# Patient Record
Sex: Male | Born: 1945 | Race: Black or African American | Hispanic: No | Marital: Married | Smoking: Never smoker
Health system: Southern US, Community
[De-identification: ages and names within clinical notes are randomized; demographics above are authoritative.]

## PROBLEM LIST (undated history)

## (undated) DIAGNOSIS — E876 Hypokalemia: Secondary | ICD-10-CM

## (undated) DIAGNOSIS — N183 Chronic kidney disease, stage 3 (moderate): Secondary | ICD-10-CM

## (undated) DIAGNOSIS — C801 Malignant (primary) neoplasm, unspecified: Secondary | ICD-10-CM

## (undated) DIAGNOSIS — D539 Nutritional anemia, unspecified: Secondary | ICD-10-CM

## (undated) DIAGNOSIS — I10 Essential (primary) hypertension: Secondary | ICD-10-CM

## (undated) DIAGNOSIS — E785 Hyperlipidemia, unspecified: Secondary | ICD-10-CM

## (undated) DIAGNOSIS — J302 Other seasonal allergic rhinitis: Secondary | ICD-10-CM

## (undated) DIAGNOSIS — R197 Diarrhea, unspecified: Secondary | ICD-10-CM

## (undated) DIAGNOSIS — D649 Anemia, unspecified: Secondary | ICD-10-CM

## (undated) HISTORY — DX: Anemia, unspecified: D64.9

## (undated) HISTORY — DX: Nutritional anemia, unspecified: D53.9

## (undated) HISTORY — PX: OTHER SURGICAL HISTORY: SHX169

## (undated) HISTORY — PX: EYE SURGERY: SHX253

## (undated) HISTORY — DX: Diarrhea, unspecified: R19.7

## (undated) HISTORY — DX: Chronic kidney disease, stage 3 (moderate): N18.3

## (undated) HISTORY — DX: Essential (primary) hypertension: I10

---

## 1999-03-13 ENCOUNTER — Encounter: Admission: RE | Admit: 1999-03-13 | Discharge: 1999-03-13 | Payer: Self-pay | Admitting: Nephrology

## 1999-03-13 ENCOUNTER — Encounter: Payer: Self-pay | Admitting: Nephrology

## 2005-10-14 ENCOUNTER — Encounter: Admission: RE | Admit: 2005-10-14 | Discharge: 2005-10-14 | Payer: Self-pay | Admitting: Nephrology

## 2008-03-18 ENCOUNTER — Emergency Department (HOSPITAL_COMMUNITY): Admission: EM | Admit: 2008-03-18 | Discharge: 2008-03-18 | Payer: Self-pay | Admitting: Emergency Medicine

## 2009-02-27 ENCOUNTER — Encounter: Admission: RE | Admit: 2009-02-27 | Discharge: 2009-02-27 | Payer: Self-pay | Admitting: Nephrology

## 2010-04-05 ENCOUNTER — Emergency Department (HOSPITAL_COMMUNITY)
Admission: EM | Admit: 2010-04-05 | Discharge: 2010-04-06 | Disposition: A | Discharge status: Home / Self Care | Payer: Medicare PPO | Attending: Emergency Medicine | Admitting: Emergency Medicine

## 2010-04-05 ENCOUNTER — Emergency Department (HOSPITAL_COMMUNITY)
Admission: EM | Admit: 2010-04-05 | Payer: Medicare PPO | Source: Home / Self Care | Attending: Emergency Medicine | Admitting: Emergency Medicine

## 2010-04-05 DIAGNOSIS — D649 Anemia, unspecified: Secondary | ICD-10-CM | POA: Insufficient documentation

## 2010-04-05 DIAGNOSIS — N289 Disorder of kidney and ureter, unspecified: Secondary | ICD-10-CM | POA: Insufficient documentation

## 2010-04-05 DIAGNOSIS — K219 Gastro-esophageal reflux disease without esophagitis: Secondary | ICD-10-CM | POA: Insufficient documentation

## 2010-04-05 DIAGNOSIS — Z79899 Other long term (current) drug therapy: Secondary | ICD-10-CM | POA: Insufficient documentation

## 2010-04-05 DIAGNOSIS — R51 Headache: Secondary | ICD-10-CM | POA: Insufficient documentation

## 2010-04-05 DIAGNOSIS — Z862 Personal history of diseases of the blood and blood-forming organs and certain disorders involving the immune mechanism: Secondary | ICD-10-CM | POA: Insufficient documentation

## 2010-04-05 DIAGNOSIS — R6884 Jaw pain: Secondary | ICD-10-CM | POA: Insufficient documentation

## 2010-04-05 DIAGNOSIS — I10 Essential (primary) hypertension: Secondary | ICD-10-CM | POA: Insufficient documentation

## 2010-04-05 DIAGNOSIS — E7800 Pure hypercholesterolemia, unspecified: Secondary | ICD-10-CM | POA: Insufficient documentation

## 2010-04-05 DIAGNOSIS — Z8639 Personal history of other endocrine, nutritional and metabolic disease: Secondary | ICD-10-CM | POA: Insufficient documentation

## 2010-04-05 LAB — DIFFERENTIAL
Basophils Relative: 0 % (ref 0–1)
Eosinophils Absolute: 0 10*3/uL (ref 0.0–0.7)
Eosinophils Relative: 0 % (ref 0–5)
Monocytes Relative: 4 % (ref 3–12)
Neutro Abs: 11 10*3/uL — ABNORMAL HIGH (ref 1.7–7.7)

## 2010-04-05 LAB — POCT I-STAT, CHEM 8
Calcium, Ion: 1.11 mmol/L — ABNORMAL LOW (ref 1.12–1.32)
Chloride: 105 mEq/L (ref 96–112)
Glucose, Bld: 140 mg/dL — ABNORMAL HIGH (ref 70–99)
HCT: 34 % — ABNORMAL LOW (ref 39.0–52.0)
TCO2: 24 mmol/L (ref 0–100)

## 2010-04-05 LAB — CBC
HCT: 28.8 % — ABNORMAL LOW (ref 39.0–52.0)
Hemoglobin: 9.3 g/dL — ABNORMAL LOW (ref 13.0–17.0)
MCH: 27.8 pg (ref 26.0–34.0)
MCHC: 32.3 g/dL (ref 30.0–36.0)
RBC: 3.35 MIL/uL — ABNORMAL LOW (ref 4.22–5.81)

## 2010-04-06 ENCOUNTER — Emergency Department (HOSPITAL_COMMUNITY): Payer: Medicare PPO

## 2010-04-06 MED ORDER — IOHEXOL 300 MG/ML  SOLN
100.0000 mL | Freq: Once | INTRAMUSCULAR | Status: AC | PRN
  Administered 2010-04-06: 75 mL via INTRAVENOUS

## 2010-07-18 ENCOUNTER — Other Ambulatory Visit: Payer: Self-pay | Admitting: Orthopedic Surgery

## 2010-07-18 DIAGNOSIS — R531 Weakness: Secondary | ICD-10-CM

## 2010-07-18 DIAGNOSIS — M419 Scoliosis, unspecified: Secondary | ICD-10-CM

## 2010-07-18 DIAGNOSIS — R52 Pain, unspecified: Secondary | ICD-10-CM

## 2010-08-01 ENCOUNTER — Ambulatory Visit
Admission: RE | Admit: 2010-08-01 | Discharge: 2010-08-01 | Disposition: A | Discharge status: Home / Self Care | Payer: Medicare PPO | Source: Ambulatory Visit | Attending: Orthopedic Surgery | Admitting: Orthopedic Surgery

## 2010-08-01 DIAGNOSIS — R52 Pain, unspecified: Secondary | ICD-10-CM

## 2010-08-01 DIAGNOSIS — M419 Scoliosis, unspecified: Secondary | ICD-10-CM

## 2010-08-01 DIAGNOSIS — R531 Weakness: Secondary | ICD-10-CM

## 2010-09-13 ENCOUNTER — Emergency Department (HOSPITAL_COMMUNITY): Payer: Medicare PPO

## 2010-09-13 ENCOUNTER — Emergency Department (HOSPITAL_COMMUNITY)
Admission: EM | Admit: 2010-09-13 | Discharge: 2010-09-13 | Disposition: A | Discharge status: Home / Self Care | Payer: Medicare PPO | Attending: Emergency Medicine | Admitting: Emergency Medicine

## 2010-09-13 DIAGNOSIS — Z862 Personal history of diseases of the blood and blood-forming organs and certain disorders involving the immune mechanism: Secondary | ICD-10-CM | POA: Insufficient documentation

## 2010-09-13 DIAGNOSIS — M25539 Pain in unspecified wrist: Secondary | ICD-10-CM | POA: Insufficient documentation

## 2010-09-13 DIAGNOSIS — I10 Essential (primary) hypertension: Secondary | ICD-10-CM | POA: Insufficient documentation

## 2010-09-13 DIAGNOSIS — R51 Headache: Secondary | ICD-10-CM | POA: Insufficient documentation

## 2010-09-13 DIAGNOSIS — M542 Cervicalgia: Secondary | ICD-10-CM | POA: Insufficient documentation

## 2010-09-13 DIAGNOSIS — M79609 Pain in unspecified limb: Secondary | ICD-10-CM | POA: Insufficient documentation

## 2010-09-13 DIAGNOSIS — Z8639 Personal history of other endocrine, nutritional and metabolic disease: Secondary | ICD-10-CM | POA: Insufficient documentation

## 2010-09-13 DIAGNOSIS — M545 Low back pain, unspecified: Secondary | ICD-10-CM | POA: Insufficient documentation

## 2010-09-13 DIAGNOSIS — K219 Gastro-esophageal reflux disease without esophagitis: Secondary | ICD-10-CM | POA: Insufficient documentation

## 2010-09-13 DIAGNOSIS — M25519 Pain in unspecified shoulder: Secondary | ICD-10-CM | POA: Insufficient documentation

## 2010-09-13 DIAGNOSIS — M25529 Pain in unspecified elbow: Secondary | ICD-10-CM | POA: Insufficient documentation

## 2010-09-13 DIAGNOSIS — M4120 Other idiopathic scoliosis, site unspecified: Secondary | ICD-10-CM | POA: Insufficient documentation

## 2010-09-13 DIAGNOSIS — Z79899 Other long term (current) drug therapy: Secondary | ICD-10-CM | POA: Insufficient documentation

## 2010-09-13 DIAGNOSIS — E789 Disorder of lipoprotein metabolism, unspecified: Secondary | ICD-10-CM | POA: Insufficient documentation

## 2010-11-08 ENCOUNTER — Encounter: Payer: Medicare PPO | Admitting: Oncology

## 2010-11-14 ENCOUNTER — Telehealth: Payer: Self-pay | Admitting: Oncology

## 2010-11-14 NOTE — Telephone Encounter (Signed)
Talked to pt , he is aware of appt date ,location and telephone number.

## 2010-11-26 ENCOUNTER — Other Ambulatory Visit: Payer: Medicare PPO | Admitting: Lab

## 2010-11-26 ENCOUNTER — Telehealth: Payer: Self-pay | Admitting: Oncology

## 2010-11-26 ENCOUNTER — Ambulatory Visit (HOSPITAL_BASED_OUTPATIENT_CLINIC_OR_DEPARTMENT_OTHER): Payer: Medicare PPO | Admitting: Oncology

## 2010-11-26 VITALS — BP 160/73 | HR 80 | Temp 97.4°F | Ht 69.0 in | Wt 201.1 lb

## 2010-11-26 DIAGNOSIS — I10 Essential (primary) hypertension: Secondary | ICD-10-CM

## 2010-11-26 DIAGNOSIS — D472 Monoclonal gammopathy: Secondary | ICD-10-CM

## 2010-11-26 DIAGNOSIS — D649 Anemia, unspecified: Secondary | ICD-10-CM

## 2010-11-26 NOTE — Telephone Encounter (Signed)
Order was placed - we are having routing problems - sent from my preference list since I could not fing 24 hr urine with IFE in facility list.

## 2010-11-27 ENCOUNTER — Encounter: Payer: Self-pay | Admitting: Oncology

## 2010-11-27 ENCOUNTER — Telehealth: Payer: Self-pay | Admitting: *Deleted

## 2010-11-27 DIAGNOSIS — D649 Anemia, unspecified: Secondary | ICD-10-CM | POA: Insufficient documentation

## 2010-11-27 DIAGNOSIS — I10 Essential (primary) hypertension: Secondary | ICD-10-CM

## 2010-11-27 DIAGNOSIS — D472 Monoclonal gammopathy: Secondary | ICD-10-CM | POA: Insufficient documentation

## 2010-11-27 HISTORY — DX: Essential (primary) hypertension: I10

## 2010-11-27 HISTORY — DX: Anemia, unspecified: D64.9
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
## 2010-11-27 NOTE — Telephone Encounter (Signed)
Pt. Notified of appt date & time for BMBX by phone.

## 2010-11-27 NOTE — Telephone Encounter (Signed)
Confirmed with Montez Morita in lab @ [facility] that he received request for BMBX for 9am 12/04/10.  Will make sure he is scheduled for 8:30 am here at the cancer center & pt. Notified.

## 2010-11-28 ENCOUNTER — Other Ambulatory Visit: Payer: Self-pay | Admitting: Oncology

## 2010-11-28 ENCOUNTER — Ambulatory Visit (HOSPITAL_COMMUNITY)
Admission: RE | Admit: 2010-11-28 | Discharge: 2010-11-28 | Disposition: A | Discharge status: Home / Self Care | Payer: Medicare PPO | Source: Ambulatory Visit | Attending: Oncology | Admitting: Oncology

## 2010-11-28 ENCOUNTER — Other Ambulatory Visit: Payer: Self-pay | Admitting: *Deleted

## 2010-11-28 ENCOUNTER — Other Ambulatory Visit (HOSPITAL_BASED_OUTPATIENT_CLINIC_OR_DEPARTMENT_OTHER): Payer: Medicare PPO

## 2010-11-28 ENCOUNTER — Other Ambulatory Visit (HOSPITAL_BASED_OUTPATIENT_CLINIC_OR_DEPARTMENT_OTHER): Payer: Medicare PPO | Admitting: Oncology

## 2010-11-28 DIAGNOSIS — M949 Disorder of cartilage, unspecified: Secondary | ICD-10-CM | POA: Insufficient documentation

## 2010-11-28 DIAGNOSIS — D649 Anemia, unspecified: Secondary | ICD-10-CM

## 2010-11-28 DIAGNOSIS — D472 Monoclonal gammopathy: Secondary | ICD-10-CM

## 2010-11-28 DIAGNOSIS — M47817 Spondylosis without myelopathy or radiculopathy, lumbosacral region: Secondary | ICD-10-CM | POA: Insufficient documentation

## 2010-11-28 DIAGNOSIS — M899 Disorder of bone, unspecified: Secondary | ICD-10-CM | POA: Insufficient documentation

## 2010-11-28 DIAGNOSIS — C9000 Multiple myeloma not having achieved remission: Secondary | ICD-10-CM | POA: Insufficient documentation

## 2010-11-28 DIAGNOSIS — M4180 Other forms of scoliosis, site unspecified: Secondary | ICD-10-CM | POA: Insufficient documentation

## 2010-11-28 LAB — COMPREHENSIVE METABOLIC PANEL
ALT: 8 U/L (ref 0–53)
Alkaline Phosphatase: 110 U/L (ref 39–117)
CO2: 21 mEq/L (ref 19–32)
Creatinine, Ser: 2.29 mg/dL — ABNORMAL HIGH (ref 0.50–1.35)
Sodium: 135 mEq/L (ref 135–145)
Total Bilirubin: 0.4 mg/dL (ref 0.3–1.2)
Total Protein: 8.1 g/dL (ref 6.0–8.3)

## 2010-11-28 LAB — CBC WITH DIFFERENTIAL/PLATELET
BASO%: 0.5 % (ref 0.0–2.0)
EOS%: 3.3 % (ref 0.0–7.0)
Eosinophils Absolute: 0.2 10*3/uL (ref 0.0–0.5)
LYMPH%: 29.9 % (ref 14.0–49.0)
MCH: 30.6 pg (ref 27.2–33.4)
MCHC: 33 g/dL (ref 32.0–36.0)
MCV: 92.6 fL (ref 79.3–98.0)
MONO%: 5.3 % (ref 0.0–14.0)
Platelets: 305 10*3/uL (ref 140–400)
RBC: 3.09 10*6/uL — ABNORMAL LOW (ref 4.20–5.82)
RDW: 18 % — ABNORMAL HIGH (ref 11.0–14.6)

## 2010-11-28 LAB — FECAL OCCULT BLOOD, GUAIAC: Occult Blood: NEGATIVE

## 2010-12-04 ENCOUNTER — Other Ambulatory Visit: Payer: Self-pay | Admitting: Oncology

## 2010-12-04 ENCOUNTER — Ambulatory Visit: Payer: Medicare PPO

## 2010-12-04 ENCOUNTER — Other Ambulatory Visit (HOSPITAL_COMMUNITY)
Admission: RE | Admit: 2010-12-04 | Discharge: 2010-12-04 | Disposition: A | Discharge status: Home / Self Care | Payer: Medicare PPO | Source: Ambulatory Visit | Attending: Oncology | Admitting: Oncology

## 2010-12-04 ENCOUNTER — Encounter: Payer: Medicare PPO | Admitting: Lab

## 2010-12-04 ENCOUNTER — Ambulatory Visit (HOSPITAL_BASED_OUTPATIENT_CLINIC_OR_DEPARTMENT_OTHER): Payer: Medicare PPO | Admitting: Oncology

## 2010-12-04 DIAGNOSIS — D649 Anemia, unspecified: Secondary | ICD-10-CM | POA: Insufficient documentation

## 2010-12-04 DIAGNOSIS — D472 Monoclonal gammopathy: Secondary | ICD-10-CM | POA: Insufficient documentation

## 2010-12-04 LAB — DIFFERENTIAL
Basophils Absolute: 0.1 10*3/uL (ref 0.0–0.1)
Eosinophils Absolute: 0.3 10*3/uL (ref 0.0–0.7)
Eosinophils Relative: 3 % (ref 0–5)
Lymphocytes Relative: 38 % (ref 12–46)
Lymphs Abs: 3 10*3/uL (ref 0.7–4.0)
Monocytes Absolute: 0.5 10*3/uL (ref 0.1–1.0)

## 2010-12-04 LAB — CBC
HCT: 26.7 % — ABNORMAL LOW (ref 39.0–52.0)
MCH: 30.1 pg (ref 26.0–34.0)
MCV: 90.2 fL (ref 78.0–100.0)
RDW: 15.6 % — ABNORMAL HIGH (ref 11.5–15.5)
WBC: 8 10*3/uL (ref 4.0–10.5)

## 2010-12-04 NOTE — Progress Notes (Signed)
Procedure only

## 2010-12-04 NOTE — Patient Instructions (Signed)
Pt instructed to keep check on bone marrow site.  If are is bleeding and pt can't stop it with pressure then pt is to call us immediately.  Area assessed prior to d/c.  Blood-tinged area to drsg the size of a half-dollar.  Pt made aware and verbalized understanding.  Pt instructed to take OTC pain med if area is sore.

## 2010-12-04 NOTE — Progress Notes (Signed)
Pt here for bmbx & infusion room nurse will tell him that bone x-ray's normal per Dr. Cyndie Chime.

## 2010-12-06 NOTE — Progress Notes (Signed)
12/04/10 Procedure note: Left posterior iliac crest bone marrow aspiration and biopsy done under 2% lidocaine anesthesia without complication. We will procedures follow. Indications: Likely myeloma. (Monoclonal gammopathy and normochromic anemia, associated renal dysfunction).

## 2010-12-12 ENCOUNTER — Other Ambulatory Visit: Payer: Self-pay | Admitting: *Deleted

## 2010-12-12 ENCOUNTER — Encounter: Payer: Self-pay | Admitting: Oncology

## 2010-12-12 ENCOUNTER — Ambulatory Visit (HOSPITAL_BASED_OUTPATIENT_CLINIC_OR_DEPARTMENT_OTHER): Payer: Medicare PPO | Admitting: Oncology

## 2010-12-12 VITALS — BP 121/73 | HR 57 | Temp 96.9°F | Ht 69.0 in | Wt 201.6 lb

## 2010-12-12 DIAGNOSIS — C9002 Multiple myeloma in relapse: Secondary | ICD-10-CM | POA: Insufficient documentation

## 2010-12-12 DIAGNOSIS — N289 Disorder of kidney and ureter, unspecified: Secondary | ICD-10-CM

## 2010-12-12 DIAGNOSIS — D472 Monoclonal gammopathy: Secondary | ICD-10-CM

## 2010-12-12 DIAGNOSIS — C9000 Multiple myeloma not having achieved remission: Secondary | ICD-10-CM

## 2010-12-12 NOTE — Progress Notes (Signed)
Short interim followup visit for this 65 year old man referred for further evaluation of anemia and an elevated monoclonal IgG immunoglobulin. Please see office consultation 11/27/10 for full details. Hemoglobin on referral 8.6. IgG 3360 mg percent with suppression of IgA 37 and IgM 9 IFE . with IgG kappa light chain restriction. Serum free light chains kappa 22.1 mg percent lambda 1.3 ratio 16.9 Additional studies done through our office showed renal dysfunction with BUN 27 creatinine 2.3, calcium normal at 8.7 with albumin 3.7, repeat hemoglobin 9.4 with white count 7000 platelets 305,000 normal white count differential 61 neutrophils 30 lymphocytes 5 monocytes. I do not see a result recorded for beta 2 microglobulin. Skeletal bone survey shows osteopenia but no lytic lesions I did a bone marrow aspiration and biopsy 12/04/10 which shows 17% plasma cells positive for CD138 kappa and lambda stains showed kappa light chain restriction. Many of the plasma cells had atypical features. However, a large clusters  or sheets of plasma cells were not identified.  Impression is IgG kappa multiple myeloma  I had a lengthy one and a half hour discussion with the patient and his wife to educate them about myeloma diagnosis prognosis and treatment. I gave them written notes. He was overwhelmed with all the information. He misunderstood and how to do a urine collection and the sample he brought in was inadequate and had to be discarded. He will bring in another sample. He thought he needed to bring in stool samples which he did and they were all negative.  He has long-standing hypertension. I called his referring internist to see if he had a history of renal insufficiency. Creatinines have been slightly elevated but are higher now than in the past. The 24-hour urine collection for total protein and immunofixation electrophoresis will help to determine if he also has renal involvement.  I outlined a 3 stage treatment  plan. Stage I is induction with biological agents including a Revlimid, Velcade, and dexamethasone. This will be given for 3-4 months. If bone marrow looks good at that point, he will proceed to stage II which will be bone marrow stimulation with Neupogen, collection of stem cells, then high-dose IV melphalan with autologous stem cell support at a [facility] hospital of his insurance companies choice. Upon recovery, stage III of treatment will be low-dose Revlimid maintenance.  We discussed potential participation in a clinical trial which is available at [facility] or [facility] which is randomizing 2 groups of patients to receive the same induction therapy and then stem cell harvest than half of the patients get an immediate high-dose IV melphalan and the other is get a consolidation program with the same drugs used in induction then a maintenance program reserving high-dose IV melphalan for signs of progression. He was not interested in the clinical trial.  I plan to begin treatment next week on 12/10 and based on a protocol published by Dr. Shirlean Mylar with a minor modification giving the Velcade by subcutaneous injection rather than intravenous. He will receive Velcade 1.3 mg per meter squared twice weekly 2 weeks on one week rest, Revlimid pills daily 14 days on 7 days rest, and Decadron 20 mg on the days that he takes the Velcade. I need to check with our pharmacist about dose reduction of the Revlimid for his degree of renal insufficiency.

## 2010-12-13 ENCOUNTER — Other Ambulatory Visit: Payer: Self-pay | Admitting: *Deleted

## 2010-12-13 ENCOUNTER — Encounter: Payer: Self-pay | Admitting: *Deleted

## 2010-12-13 MED ORDER — LENALIDOMIDE 10 MG PO CAPS: 10.0000 mg | ORAL_CAPSULE | Freq: Every day | ORAL | Status: DC

## 2010-12-13 NOTE — Progress Notes (Signed)
Late Entry:  Teaching done on revlimid on 12/12/10 & pt registered with [employer] after patient/physician form review &  pt signed.   Auth obtained today & script sent to [facility].

## 2010-12-14 ENCOUNTER — Telehealth: Payer: Self-pay | Admitting: Oncology

## 2010-12-14 NOTE — Telephone Encounter (Signed)
[facility] [phone] for a revlimid 10mg  prior auth form, should receive form today or Monday ref # [id number]

## 2010-12-17 ENCOUNTER — Ambulatory Visit (HOSPITAL_BASED_OUTPATIENT_CLINIC_OR_DEPARTMENT_OTHER): Payer: Medicare PPO

## 2010-12-17 ENCOUNTER — Ambulatory Visit: Payer: Medicare PPO

## 2010-12-17 ENCOUNTER — Other Ambulatory Visit (HOSPITAL_BASED_OUTPATIENT_CLINIC_OR_DEPARTMENT_OTHER): Payer: Medicare PPO | Admitting: Lab

## 2010-12-17 ENCOUNTER — Telehealth: Payer: Self-pay | Admitting: Oncology

## 2010-12-17 DIAGNOSIS — D649 Anemia, unspecified: Secondary | ICD-10-CM

## 2010-12-17 DIAGNOSIS — C9000 Multiple myeloma not having achieved remission: Secondary | ICD-10-CM

## 2010-12-17 DIAGNOSIS — D472 Monoclonal gammopathy: Secondary | ICD-10-CM

## 2010-12-17 DIAGNOSIS — Z5112 Encounter for antineoplastic immunotherapy: Secondary | ICD-10-CM

## 2010-12-17 LAB — CBC WITH DIFFERENTIAL/PLATELET
BASO%: 0.6 % (ref 0.0–2.0)
EOS%: 1.7 % (ref 0.0–7.0)
MCH: 30.6 pg (ref 27.2–33.4)
MCHC: 32.8 g/dL (ref 32.0–36.0)
RDW: 17.3 % — ABNORMAL HIGH (ref 11.0–14.6)
lymph#: 2.3 10*3/uL (ref 0.9–3.3)

## 2010-12-17 MED ORDER — BORTEZOMIB CHEMO SQ INJECTION 3.5 MG (2.5MG/ML)
1.3000 mg/m2 | Freq: Once | INTRAMUSCULAR | Status: AC
  Administered 2010-12-17: 2.75 mg via SUBCUTANEOUS
  Filled 2010-12-17: qty 2.75

## 2010-12-17 MED ORDER — ONDANSETRON HCL 8 MG PO TABS
8.0000 mg | ORAL_TABLET | Freq: Once | ORAL | Status: AC
  Administered 2010-12-17: 8 mg via ORAL

## 2010-12-17 MED ORDER — VALACYCLOVIR HCL 500 MG PO TABS: 500.0000 mg | ORAL_TABLET | Freq: Once | ORAL | Status: DC

## 2010-12-17 NOTE — Telephone Encounter (Signed)
Gave patient EPP application for assistance, advised to return with proof of income.

## 2010-12-17 NOTE — Patient Instructions (Signed)
[facility] Discharge Instructions for Patients Receiving Chemotherapy  Today you received the following chemotherapy agents Velcade  To help prevent nausea and vomiting after your treatment, we encourage you to take your nausea medication as directed by MD    If you develop nausea and vomiting that is not controlled by your nausea medication, call the clinic. If it is after clinic hours your family physician or the after hours number for the clinic or go to the Emergency Department.   BELOW ARE SYMPTOMS THAT SHOULD BE REPORTED IMMEDIATELY:  *FEVER GREATER THAN 100.5 F  *CHILLS WITH OR WITHOUT FEVER  NAUSEA AND VOMITING THAT IS NOT CONTROLLED WITH YOUR NAUSEA MEDICATION  *UNUSUAL SHORTNESS OF BREATH  *UNUSUAL BRUISING OR BLEEDING  TENDERNESS IN MOUTH AND THROAT WITH OR WITHOUT PRESENCE OF ULCERS  *URINARY PROBLEMS  *BOWEL PROBLEMS  UNUSUAL RASH Items with * indicate a potential emergency and should be followed up as soon as possible.  One of the nurses will contact you 24 hours after your first treatment. Please let the nurse know about any problems that you may have experienced. Feel free to call the clinic you have any questions or concerns. The clinic phone number is [phone].   I have been informed and understand all the instructions given to me. I know to contact the clinic, my physician, or go to the Emergency Department if any problems should occur. I do not have any questions at this time, but understand that I may call the clinic during office hours   should I have any questions or need assistance in obtaining follow up care.    __________________________________________  _____________  __________ Signature of Patient or Authorized Representative            Date                   Time    __________________________________________ Nurse's Signature    

## 2010-12-18 ENCOUNTER — Telehealth: Payer: Self-pay

## 2010-12-18 ENCOUNTER — Telehealth: Payer: Self-pay | Admitting: Oncology

## 2010-12-18 ENCOUNTER — Other Ambulatory Visit: Payer: Medicare PPO

## 2010-12-18 LAB — CREATININE CLEARANCE, URINE, 24 HOUR
Collection Interval-CRCL: 24 hours
Creatinine Clearance: 79 mL/min (ref 75–125)
Creatinine, 24H Ur: 2300 mg/d — ABNORMAL HIGH (ref 800–2000)
Urine Total Volume-CRCL: 1500 mL

## 2010-12-18 LAB — UIFE/LIGHT CHAINS/TP QN, 24-HR UR
Alpha 2, Urine: DETECTED — AB
Beta, Urine: DETECTED — AB
Free Kappa Lt Chains,Ur: 20 mg/dL — ABNORMAL HIGH (ref 0.14–2.42)
Free Lambda Lt Chains,Ur: 0.21 mg/dL (ref 0.02–0.67)
Free Lt Chn Excr Rate: 300 mg/d
Volume, Urine: 1500 mL

## 2010-12-18 NOTE — Telephone Encounter (Signed)
Spoke with pt to f/u after 1st Velcade treatment yesterday.  Pt denies n/v, fatigue, diarrhea, constipation.  Pt is aware of how to contact office if problems arise.  dph

## 2010-12-18 NOTE — Telephone Encounter (Signed)
[facility] pharmacy can not fill the revlimid prescription, faxed to [facility] pharmacy @ [phone]. Prior authorization has been approved until 06/15/11.

## 2010-12-20 ENCOUNTER — Telehealth: Payer: Self-pay | Admitting: Oncology

## 2010-12-20 ENCOUNTER — Ambulatory Visit (HOSPITAL_BASED_OUTPATIENT_CLINIC_OR_DEPARTMENT_OTHER): Payer: Medicare PPO

## 2010-12-20 DIAGNOSIS — Z5112 Encounter for antineoplastic immunotherapy: Secondary | ICD-10-CM

## 2010-12-20 DIAGNOSIS — D649 Anemia, unspecified: Secondary | ICD-10-CM

## 2010-12-20 DIAGNOSIS — D472 Monoclonal gammopathy: Secondary | ICD-10-CM

## 2010-12-20 DIAGNOSIS — C9000 Multiple myeloma not having achieved remission: Secondary | ICD-10-CM

## 2010-12-20 MED ORDER — BORTEZOMIB CHEMO SQ INJECTION 3.5 MG (2.5MG/ML)
1.3000 mg/m2 | Freq: Once | INTRAMUSCULAR | Status: AC
  Administered 2010-12-20: 2.75 mg via SUBCUTANEOUS
  Filled 2010-12-20: qty 2.75

## 2010-12-20 MED ORDER — ONDANSETRON HCL 8 MG PO TABS
8.0000 mg | ORAL_TABLET | Freq: Once | ORAL | Status: AC
  Administered 2010-12-20: 8 mg via ORAL

## 2010-12-20 NOTE — Patient Instructions (Signed)
Patient aware of next appointment; discharged home with no complaints; ambulatory; patient has medication for nausea and pain.

## 2010-12-20 NOTE — Telephone Encounter (Signed)
Patient approve for 70% Discount 12/20/10 - 12/20/11

## 2010-12-24 ENCOUNTER — Telehealth: Payer: Self-pay | Admitting: Oncology

## 2010-12-24 ENCOUNTER — Other Ambulatory Visit (HOSPITAL_BASED_OUTPATIENT_CLINIC_OR_DEPARTMENT_OTHER): Payer: Medicare PPO | Admitting: Lab

## 2010-12-24 ENCOUNTER — Ambulatory Visit (HOSPITAL_BASED_OUTPATIENT_CLINIC_OR_DEPARTMENT_OTHER): Payer: Medicare PPO

## 2010-12-24 DIAGNOSIS — D472 Monoclonal gammopathy: Secondary | ICD-10-CM

## 2010-12-24 DIAGNOSIS — C9000 Multiple myeloma not having achieved remission: Secondary | ICD-10-CM

## 2010-12-24 DIAGNOSIS — Z5112 Encounter for antineoplastic immunotherapy: Secondary | ICD-10-CM

## 2010-12-24 DIAGNOSIS — D649 Anemia, unspecified: Secondary | ICD-10-CM

## 2010-12-24 LAB — CBC WITH DIFFERENTIAL/PLATELET
Basophils Absolute: 0 10*3/uL (ref 0.0–0.1)
EOS%: 2.4 % (ref 0.0–7.0)
HCT: 28.6 % — ABNORMAL LOW (ref 38.4–49.9)
HGB: 9.5 g/dL — ABNORMAL LOW (ref 13.0–17.1)
LYMPH%: 29 % (ref 14.0–49.0)
MCH: 29.6 pg (ref 27.2–33.4)
MCHC: 33.2 g/dL (ref 32.0–36.0)
MCV: 89.1 fL (ref 79.3–98.0)
MONO%: 8.2 % (ref 0.0–14.0)
NEUT%: 60.1 % (ref 39.0–75.0)
Platelets: 333 10*3/uL (ref 140–400)

## 2010-12-24 MED ORDER — BORTEZOMIB CHEMO SQ INJECTION 3.5 MG (2.5MG/ML)
1.3000 mg/m2 | Freq: Once | INTRAMUSCULAR | Status: AC
Start: 2010-12-24 — End: 2010-12-24
  Administered 2010-12-24: 2.75 mg via SUBCUTANEOUS
  Filled 2010-12-24: qty 2.75

## 2010-12-24 MED ORDER — ONDANSETRON HCL 8 MG PO TABS
8.0000 mg | ORAL_TABLET | Freq: Once | ORAL | Status: AC
  Administered 2010-12-24: 8 mg via ORAL

## 2010-12-24 NOTE — Progress Notes (Signed)
Pt stated he took dexamethasone 20 mg at home this am as noted in Progress note dated 12/17/10.

## 2010-12-24 NOTE — Telephone Encounter (Signed)
Calvin Mccormick and his wife stop by today,because he needed help with transportation.I call [facility] and they gave me another number to call to help them with transportation.its call [employer] [facility],[phone].

## 2010-12-25 ENCOUNTER — Other Ambulatory Visit: Payer: Self-pay | Admitting: Certified Registered Nurse Anesthetist

## 2010-12-27 ENCOUNTER — Ambulatory Visit (HOSPITAL_BASED_OUTPATIENT_CLINIC_OR_DEPARTMENT_OTHER): Payer: Medicare PPO

## 2010-12-27 ENCOUNTER — Other Ambulatory Visit (HOSPITAL_BASED_OUTPATIENT_CLINIC_OR_DEPARTMENT_OTHER): Payer: Medicare PPO | Admitting: Lab

## 2010-12-27 DIAGNOSIS — D472 Monoclonal gammopathy: Secondary | ICD-10-CM

## 2010-12-27 DIAGNOSIS — C9000 Multiple myeloma not having achieved remission: Secondary | ICD-10-CM

## 2010-12-27 DIAGNOSIS — D649 Anemia, unspecified: Secondary | ICD-10-CM

## 2010-12-27 DIAGNOSIS — Z5112 Encounter for antineoplastic immunotherapy: Secondary | ICD-10-CM

## 2010-12-27 LAB — CBC WITH DIFFERENTIAL/PLATELET
Basophils Absolute: 0 10*3/uL (ref 0.0–0.1)
EOS%: 1.9 % (ref 0.0–7.0)
HGB: 10.2 g/dL — ABNORMAL LOW (ref 13.0–17.1)
MCH: 30.3 pg (ref 27.2–33.4)
MCV: 93.2 fL (ref 79.3–98.0)
MONO%: 4.9 % (ref 0.0–14.0)
NEUT#: 7.7 10*3/uL — ABNORMAL HIGH (ref 1.5–6.5)
RBC: 3.35 10*6/uL — ABNORMAL LOW (ref 4.20–5.82)
RDW: 17.9 % — ABNORMAL HIGH (ref 11.0–14.6)
lymph#: 1.8 10*3/uL (ref 0.9–3.3)

## 2010-12-27 MED ORDER — BORTEZOMIB CHEMO SQ INJECTION 3.5 MG (2.5MG/ML)
1.3000 mg/m2 | Freq: Once | INTRAMUSCULAR | Status: AC
  Administered 2010-12-27: 2.75 mg via SUBCUTANEOUS
  Filled 2010-12-27: qty 2.75

## 2010-12-27 MED ORDER — ONDANSETRON HCL 8 MG PO TABS
8.0000 mg | ORAL_TABLET | Freq: Once | ORAL | Status: AC
  Administered 2010-12-27: 8 mg via ORAL

## 2010-12-27 NOTE — Patient Instructions (Signed)
Pt ambulates out of dept with family.  Aware of next appt.  dph

## 2010-12-28 LAB — COMPREHENSIVE METABOLIC PANEL
ALT: 9 U/L (ref 0–53)
AST: 10 U/L (ref 0–37)
Albumin: 3.6 g/dL (ref 3.5–5.2)
Alkaline Phosphatase: 81 U/L (ref 39–117)
BUN: 35 mg/dL — ABNORMAL HIGH (ref 6–23)
Calcium: 8.3 mg/dL — ABNORMAL LOW (ref 8.4–10.5)
Chloride: 107 mEq/L (ref 96–112)
Potassium: 4.7 mEq/L (ref 3.5–5.3)
Sodium: 138 mEq/L (ref 135–145)
Total Protein: 7.3 g/dL (ref 6.0–8.3)

## 2010-12-28 LAB — KAPPA/LAMBDA LIGHT CHAINS
Kappa free light chain: 1.57 mg/dL (ref 0.33–1.94)
Kappa:Lambda Ratio: 1.99 — ABNORMAL HIGH (ref 0.26–1.65)
Lambda Free Lght Chn: 0.79 mg/dL (ref 0.57–2.63)

## 2011-01-04 ENCOUNTER — Ambulatory Visit (HOSPITAL_BASED_OUTPATIENT_CLINIC_OR_DEPARTMENT_OTHER): Payer: Medicare PPO | Admitting: Nurse Practitioner

## 2011-01-04 ENCOUNTER — Other Ambulatory Visit: Payer: Self-pay | Admitting: *Deleted

## 2011-01-04 ENCOUNTER — Encounter: Payer: Self-pay | Admitting: *Deleted

## 2011-01-04 ENCOUNTER — Telehealth: Payer: Self-pay | Admitting: Oncology

## 2011-01-04 ENCOUNTER — Other Ambulatory Visit: Payer: Self-pay | Admitting: Oncology

## 2011-01-04 ENCOUNTER — Other Ambulatory Visit (HOSPITAL_BASED_OUTPATIENT_CLINIC_OR_DEPARTMENT_OTHER): Payer: Medicare PPO | Admitting: Lab

## 2011-01-04 VITALS — BP 128/67 | HR 78 | Temp 98.5°F | Ht 69.0 in | Wt 201.5 lb

## 2011-01-04 DIAGNOSIS — C9000 Multiple myeloma not having achieved remission: Secondary | ICD-10-CM

## 2011-01-04 LAB — CBC WITH DIFFERENTIAL/PLATELET
Basophils Absolute: 0 10*3/uL (ref 0.0–0.1)
Eosinophils Absolute: 0.2 10*3/uL (ref 0.0–0.5)
HGB: 9.9 g/dL — ABNORMAL LOW (ref 13.0–17.1)
MONO#: 0.9 10*3/uL (ref 0.1–0.9)
NEUT#: 8.5 10*3/uL — ABNORMAL HIGH (ref 1.5–6.5)
RBC: 3.2 10*6/uL — ABNORMAL LOW (ref 4.20–5.82)
RDW: 17.4 % — ABNORMAL HIGH (ref 11.0–14.6)
WBC: 11.1 10*3/uL — ABNORMAL HIGH (ref 4.0–10.3)
lymph#: 1.4 10*3/uL (ref 0.9–3.3)

## 2011-01-04 MED ORDER — LENALIDOMIDE 10 MG PO CAPS: 10.0000 mg | ORAL_CAPSULE | Freq: Every day | ORAL | Status: DC

## 2011-01-04 NOTE — Telephone Encounter (Signed)
Pt is aware to pick up a schedule for jan this monday

## 2011-01-04 NOTE — Progress Notes (Signed)
[facility] faxed refill request for Revlimid.  Request to MD for review.

## 2011-01-04 NOTE — Progress Notes (Signed)
OFFICE PROGRESS NOTE  Interval history:  Calvin Mccormick is a 65 year old man recently diagnosed with IgG kappa multiple myeloma. He is on active treatment with Revlimid/Velcade/dexamethasone. The Velcade is given twice weekly 2 weeks on followed by a one-week break; Revlimid 10 mg daily 14 days on followed by a 7 day rest; Decadron 20 mg on the days he takes Velcade. He completed the first cycle of Velcade 12/10, 12/13, 12/17 and 12/27/2010. He completed the first cycle of Revlimid beginning 12/19/2010. He is seen today for scheduled followup.  Calvin Mccormick reports that overall he is tolerating treatment well. He denies nausea/vomiting. No mouth sores. He has recently had mild constipation. He denies numbness/tingling in his hands or feet. No shortness of breath or chest pain. He denies leg swelling or calf pain.   Objective: Blood pressure 128/67, pulse 78, temperature 98.5 F (36.9 C), temperature source Oral, height 5\' 9"  (1.753 m), weight 201 lb 8 oz (91.4 kg).  Oropharynx is without thrush or ulceration. No palpable cervical, supra-clavicular or axillary lymph nodes. Lungs are clear. No wheezes or rales. Regular cardiac rhythm. Abdomen is soft and nontender. No organomegaly. Extremities are without edema. Calves soft and nontender. Motor strength 5 over 5. Vibratory sense intact over the fingertips per tuning fork exam.   Lab Results: Lab Results  Component Value Date   WBC 11.1* 01/04/2011   HGB 9.9* 01/04/2011   HCT 30.0* 01/04/2011   MCV 93.6 01/04/2011   PLT 245 01/04/2011    Chemistry:    Chemistry      Component Value Date/Time   NA 138 12/27/2010 1205   K 4.7 12/27/2010 1205   CL 107 12/27/2010 1205   CO2 21 12/27/2010 1205   BUN 35* 12/27/2010 1205   CREATININE 2.15* 12/27/2010 1205   CREATININE 2.03* 12/14/2010 1114      Component Value Date/Time   CALCIUM 8.3* 12/27/2010 1205   ALKPHOS 81 12/27/2010 1205   AST 10 12/27/2010 1205   ALT 9 12/27/2010 1205   BILITOT 0.3  12/27/2010 1205       Studies/Results: No results found.  Medications: I have reviewed the patient's current medications.  Assessment/Plan: 1. IgG kappa multiple myeloma-initially referred for further evaluation of anemia and an elevated monoclonal IgG immunoglobulin. Hemoglobin on referral 8.6; IgG 3360 mg with suppression of IgA 37 and IgM 9; IFE with IgG kappa light chain restriction; serum free light chains with kappa 22.1 mg, lambda 1.3 and ratio 16.9. Additional studies done through our office showed renal dysfunction with BUN 27 and creatinine 2.3, calcium normal at 8.7 with albumin 3.7, repeat hemoglobin 9.4 with white count 7000, platelets 305,000, normal white count differential (61 neutrophils, 30 lymphocytes, 5 monocytes). Skeletal bone survey 11/26/10 with mild osteopenia throughout the spine, spondylosis throughout the spine, no suspicious focal lytic lesions. Bone marrow aspiration and biopsy 12/04/10 showed 17% plasma cells positive for CD138, kappa and lambda stains showed kappa light chain restriction. Many of the plasma cells had atypical features. Large clusters or sheets of plasma cells were not identified. He completed the first cycle of Velcade 12/10, 12/13, 12/17 and 12/27/2010. He completed the first cycle of Revlimid beginning 12/19/2010. He takes Decadron at 20 mg on the days he receives Velcade.  Disposition-Calvin Mccormick tolerated the first cycle of Revlimid/Velcade/dexamethasone well. He will return to the office to begin cycle #2 on 01/07/2011. We will see him in followup on 01/17/2011. He will contact the office the interim with any problems.  Lonna Cobb ANP/GNP-BC

## 2011-01-07 ENCOUNTER — Ambulatory Visit (HOSPITAL_BASED_OUTPATIENT_CLINIC_OR_DEPARTMENT_OTHER): Payer: Medicare PPO

## 2011-01-07 ENCOUNTER — Other Ambulatory Visit: Payer: Self-pay | Admitting: Oncology

## 2011-01-07 ENCOUNTER — Other Ambulatory Visit (HOSPITAL_BASED_OUTPATIENT_CLINIC_OR_DEPARTMENT_OTHER): Payer: Medicare PPO | Admitting: Lab

## 2011-01-07 DIAGNOSIS — C9000 Multiple myeloma not having achieved remission: Secondary | ICD-10-CM

## 2011-01-07 DIAGNOSIS — D649 Anemia, unspecified: Secondary | ICD-10-CM

## 2011-01-07 DIAGNOSIS — Z5112 Encounter for antineoplastic immunotherapy: Secondary | ICD-10-CM

## 2011-01-07 DIAGNOSIS — D472 Monoclonal gammopathy: Secondary | ICD-10-CM

## 2011-01-07 LAB — CBC WITH DIFFERENTIAL/PLATELET
Basophils Absolute: 0.1 10*3/uL (ref 0.0–0.1)
Eosinophils Absolute: 0.3 10*3/uL (ref 0.0–0.5)
HCT: 32 % — ABNORMAL LOW (ref 38.4–49.9)
HGB: 10.4 g/dL — ABNORMAL LOW (ref 13.0–17.1)
LYMPH%: 11 % — ABNORMAL LOW (ref 14.0–49.0)
MCV: 89.9 fL (ref 79.3–98.0)
MONO#: 1.3 10*3/uL — ABNORMAL HIGH (ref 0.1–0.9)
NEUT#: 10.4 10*3/uL — ABNORMAL HIGH (ref 1.5–6.5)
NEUT%: 76.7 % — ABNORMAL HIGH (ref 39.0–75.0)
Platelets: 325 10*3/uL (ref 140–400)
RBC: 3.56 10*6/uL — ABNORMAL LOW (ref 4.20–5.82)
WBC: 13.6 10*3/uL — ABNORMAL HIGH (ref 4.0–10.3)
nRBC: 0 % (ref 0–0)

## 2011-01-07 MED ORDER — BORTEZOMIB CHEMO SQ INJECTION 3.5 MG (2.5MG/ML)
1.3000 mg/m2 | Freq: Once | INTRAMUSCULAR | Status: AC
  Administered 2011-01-07: 2.75 mg via SUBCUTANEOUS
  Filled 2011-01-07: qty 2.75

## 2011-01-07 MED ORDER — ONDANSETRON HCL 8 MG PO TABS
8.0000 mg | ORAL_TABLET | Freq: Once | ORAL | Status: AC
  Administered 2011-01-07: 8 mg via ORAL

## 2011-01-07 NOTE — Patient Instructions (Signed)
CBC results showed to Dr. Cyndie Chime.   Proceed with chemo  SQ Velcade as ordered.

## 2011-01-09 NOTE — Progress Notes (Signed)
Received fax confirmation from [facility] that pt's Revlimid was shipped for delivery on 01/10/11. dph

## 2011-01-10 ENCOUNTER — Ambulatory Visit (HOSPITAL_BASED_OUTPATIENT_CLINIC_OR_DEPARTMENT_OTHER): Payer: Medicare PPO

## 2011-01-10 DIAGNOSIS — D472 Monoclonal gammopathy: Secondary | ICD-10-CM

## 2011-01-10 DIAGNOSIS — Z5112 Encounter for antineoplastic immunotherapy: Secondary | ICD-10-CM

## 2011-01-10 DIAGNOSIS — D649 Anemia, unspecified: Secondary | ICD-10-CM

## 2011-01-10 DIAGNOSIS — C9000 Multiple myeloma not having achieved remission: Secondary | ICD-10-CM

## 2011-01-10 MED ORDER — ONDANSETRON HCL 8 MG PO TABS
8.0000 mg | ORAL_TABLET | Freq: Once | ORAL | Status: AC
  Administered 2011-01-10: 8 mg via ORAL

## 2011-01-10 MED ORDER — BORTEZOMIB CHEMO SQ INJECTION 3.5 MG (2.5MG/ML)
1.3000 mg/m2 | Freq: Once | INTRAMUSCULAR | Status: AC
  Administered 2011-01-10: 2.75 mg via SUBCUTANEOUS
  Filled 2011-01-10: qty 2.75

## 2011-01-11 ENCOUNTER — Other Ambulatory Visit: Payer: Medicare PPO | Admitting: Lab

## 2011-01-11 ENCOUNTER — Ambulatory Visit: Payer: Medicare PPO

## 2011-01-14 ENCOUNTER — Ambulatory Visit (HOSPITAL_BASED_OUTPATIENT_CLINIC_OR_DEPARTMENT_OTHER): Payer: Medicare PPO

## 2011-01-14 ENCOUNTER — Other Ambulatory Visit (HOSPITAL_BASED_OUTPATIENT_CLINIC_OR_DEPARTMENT_OTHER): Payer: Medicare PPO | Admitting: Lab

## 2011-01-14 DIAGNOSIS — C9000 Multiple myeloma not having achieved remission: Secondary | ICD-10-CM

## 2011-01-14 DIAGNOSIS — Z5112 Encounter for antineoplastic immunotherapy: Secondary | ICD-10-CM

## 2011-01-14 DIAGNOSIS — D649 Anemia, unspecified: Secondary | ICD-10-CM

## 2011-01-14 DIAGNOSIS — D472 Monoclonal gammopathy: Secondary | ICD-10-CM

## 2011-01-14 LAB — CBC WITH DIFFERENTIAL/PLATELET
Basophils Absolute: 0.1 10*3/uL (ref 0.0–0.1)
Eosinophils Absolute: 0.1 10*3/uL (ref 0.0–0.5)
LYMPH%: 23.1 % (ref 14.0–49.0)
MCV: 89.1 fL (ref 79.3–98.0)
MONO%: 11.2 % (ref 0.0–14.0)
NEUT#: 5.6 10*3/uL (ref 1.5–6.5)
Platelets: 293 10*3/uL (ref 140–400)
RBC: 3.29 10*6/uL — ABNORMAL LOW (ref 4.20–5.82)
nRBC: 1 % — ABNORMAL HIGH (ref 0–0)

## 2011-01-14 LAB — TECHNOLOGIST REVIEW

## 2011-01-14 MED ORDER — ONDANSETRON HCL 8 MG PO TABS
8.0000 mg | ORAL_TABLET | Freq: Once | ORAL | Status: AC
  Administered 2011-01-14: 8 mg via ORAL

## 2011-01-14 MED ORDER — BORTEZOMIB CHEMO SQ INJECTION 3.5 MG (2.5MG/ML)
1.3000 mg/m2 | Freq: Once | INTRAMUSCULAR | Status: AC
  Administered 2011-01-14: 2.75 mg via SUBCUTANEOUS
  Filled 2011-01-14: qty 2.75

## 2011-01-17 ENCOUNTER — Other Ambulatory Visit: Payer: Medicare PPO | Admitting: Lab

## 2011-01-17 ENCOUNTER — Encounter: Payer: Self-pay | Admitting: *Deleted

## 2011-01-17 ENCOUNTER — Ambulatory Visit (HOSPITAL_BASED_OUTPATIENT_CLINIC_OR_DEPARTMENT_OTHER): Payer: Medicare PPO

## 2011-01-17 ENCOUNTER — Ambulatory Visit: Payer: Medicare PPO

## 2011-01-17 ENCOUNTER — Other Ambulatory Visit: Payer: Self-pay | Admitting: *Deleted

## 2011-01-17 ENCOUNTER — Ambulatory Visit: Payer: Medicare PPO | Admitting: Nurse Practitioner

## 2011-01-17 VITALS — BP 101/53 | HR 70 | Temp 97.1°F | Ht 69.0 in | Wt 200.2 lb

## 2011-01-17 DIAGNOSIS — D649 Anemia, unspecified: Secondary | ICD-10-CM

## 2011-01-17 DIAGNOSIS — C9000 Multiple myeloma not having achieved remission: Secondary | ICD-10-CM

## 2011-01-17 DIAGNOSIS — E875 Hyperkalemia: Secondary | ICD-10-CM

## 2011-01-17 DIAGNOSIS — D472 Monoclonal gammopathy: Secondary | ICD-10-CM

## 2011-01-17 LAB — CBC WITH DIFFERENTIAL/PLATELET
Basophils Absolute: 0.1 10*3/uL (ref 0.0–0.1)
Eosinophils Absolute: 0.1 10*3/uL (ref 0.0–0.5)
HCT: 29 % — ABNORMAL LOW (ref 38.4–49.9)
HGB: 9.6 g/dL — ABNORMAL LOW (ref 13.0–17.1)
LYMPH%: 15.5 % (ref 14.0–49.0)
MONO#: 0.4 10*3/uL (ref 0.1–0.9)
NEUT#: 6.3 10*3/uL (ref 1.5–6.5)
NEUT%: 76.9 % — ABNORMAL HIGH (ref 39.0–75.0)
Platelets: 245 10*3/uL (ref 140–400)
WBC: 8.2 10*3/uL (ref 4.0–10.3)
lymph#: 1.3 10*3/uL (ref 0.9–3.3)

## 2011-01-17 LAB — COMPREHENSIVE METABOLIC PANEL
Albumin: 3.7 g/dL (ref 3.5–5.2)
BUN: 40 mg/dL — ABNORMAL HIGH (ref 6–23)
CO2: 20 mEq/L (ref 19–32)
Calcium: 8.4 mg/dL (ref 8.4–10.5)
Chloride: 106 mEq/L (ref 96–112)
Creatinine, Ser: 2.67 mg/dL — ABNORMAL HIGH (ref 0.50–1.35)
Potassium: 5.9 mEq/L — ABNORMAL HIGH (ref 3.5–5.3)

## 2011-01-17 LAB — LACTATE DEHYDROGENASE: LDH: 182 U/L (ref 94–250)

## 2011-01-17 LAB — TECHNOLOGIST REVIEW

## 2011-01-17 MED ORDER — BORTEZOMIB CHEMO SQ INJECTION 3.5 MG (2.5MG/ML)
1.3000 mg/m2 | Freq: Once | INTRAMUSCULAR | Status: AC
  Administered 2011-01-17: 2.75 mg via SUBCUTANEOUS
  Filled 2011-01-17: qty 2.75

## 2011-01-17 MED ORDER — SODIUM POLYSTYRENE SULFONATE PO POWD: ORAL | Status: DC

## 2011-01-17 MED ORDER — ONDANSETRON HCL 8 MG PO TABS
8.0000 mg | ORAL_TABLET | Freq: Once | ORAL | Status: AC
  Administered 2011-01-17: 8 mg via ORAL

## 2011-01-17 NOTE — Progress Notes (Signed)
OFFICE PROGRESS NOTE  Interval history:  Calvin Mccormick is a 66 year old man recently diagnosed with IgG kappa multiple myeloma. He is on active treatment with Revlimid/Velcade/dexamethasone. The Velcade is given twice weekly 2 weeks on followed by a one-week break; Revlimid 10 mg daily 14 days on followed by a 7 day rest; Decadron 20 mg on the days he takes Velcade. He completed the first cycle of Velcade 12/10, 12/13, 12/17 and 12/27/2010. He completed the first cycle of Revlimid beginning 12/19/2010. He began cycle #2 Velcade 01/07/2011 and cycle #2 Revlimid 01/10/2011. He is seen today for scheduled followup.  [name] reports that overall he is tolerating treatment well. He denies nausea/vomiting. No mouth sores. No diarrhea or constipation. He denies numbness/tingling in his hands or feet. No shortness of breath or chest pain. He denies leg swelling or calf pain.   Objective: Blood pressure 101/53, pulse 70, temperature 97.1 F (36.2 C), temperature source Oral, height 5\' 9"  (1.753 m), weight 200 lb 3.2 oz (90.81 kg).  Oropharynx is without thrush or ulceration.  Lungs are clear. No wheezes or rales. Regular cardiac rhythm. Abdomen is soft and nontender. No organomegaly. Extremities are without edema. Calves soft and nontender. Motor strength 5 over 5. Vibratory sense mildly decreased over the fingertips per tuning fork exam.   Lab Results: Lab Results  Component Value Date   WBC 8.2 01/17/2011   HGB 9.6* 01/17/2011   HCT 29.0* 01/17/2011   MCV 88.7 01/17/2011   PLT 245 01/17/2011    Chemistry:    Chemistry      Component Value Date/Time   NA 138 12/27/2010 1205   K 4.7 12/27/2010 1205   CL 107 12/27/2010 1205   CO2 21 12/27/2010 1205   BUN 35* 12/27/2010 1205   CREATININE 2.15* 12/27/2010 1205   CREATININE 2.03* 12/14/2010 1114      Component Value Date/Time   CALCIUM 8.3* 12/27/2010 1205   ALKPHOS 81 12/27/2010 1205   AST 10 12/27/2010 1205   ALT 9 12/27/2010 1205   BILITOT  0.3 12/27/2010 1205       Studies/Results: No results found.  Medications: I have reviewed the patient's current medications.  Assessment/Plan: 1. IgG kappa multiple myeloma-initially referred for further evaluation of anemia and an elevated monoclonal IgG immunoglobulin. Hemoglobin on referral 8.6; IgG 3360 mg with suppression of IgA 37 and IgM 9; IFE with IgG kappa light chain restriction; serum free light chains with kappa 22.1 mg, lambda 1.3 and ratio 16.9. Additional studies done through our office showed renal dysfunction with BUN 27 and creatinine 2.3, calcium normal at 8.7 with albumin 3.7, repeat hemoglobin 9.4 with white count 7000, platelets 305,000, normal white count differential (61 neutrophils, 30 lymphocytes, 5 monocytes). Skeletal bone survey 11/26/10 with mild osteopenia throughout the spine, spondylosis throughout the spine, no suspicious focal lytic lesions. Bone marrow aspiration and biopsy 12/04/10 showed 17% plasma cells positive for CD138, kappa and lambda stains showed kappa light chain restriction. Many of the plasma cells had atypical features. Large clusters or sheets of plasma cells were not identified. He completed the first cycle of Velcade 12/10, 12/13, 12/17 and 12/27/2010. He completed the first cycle of Revlimid beginning 12/19/2010. He takes Decadron at 20 mg on the days he receives Velcade. He will complete cycle 2 Velcade (12/31, 1/3, 1/7) today. He began cycle #2 Revlimid 01/10/2011.  Disposition-Calvin Mccormick is tolerating treatment well. He will return to the office to begin cycle #3 Velcade on 01/28/2011. Dr. Cyndie Chime will see  him in followup on 02/05/2011. He will contact the office the interim with any problems.    Lonna Cobb ANP/GNP-BC

## 2011-01-17 NOTE — Progress Notes (Signed)
Received labs on pt late this pm & discussed with Dr. Truett Perna.  Pt. Has high K+ = 5.9.  Ordered kayexalate per Dr. Kalman Drape order for tonight & in am & pt told to hold other meds until he comes in tomorrow am for repeat labs. Pt. expressed understanding.

## 2011-01-18 ENCOUNTER — Encounter: Payer: Self-pay | Admitting: *Deleted

## 2011-01-18 ENCOUNTER — Other Ambulatory Visit (HOSPITAL_BASED_OUTPATIENT_CLINIC_OR_DEPARTMENT_OTHER): Payer: Medicare PPO | Admitting: Lab

## 2011-01-18 ENCOUNTER — Encounter: Payer: Medicare PPO | Admitting: Nutrition

## 2011-01-18 DIAGNOSIS — C9000 Multiple myeloma not having achieved remission: Secondary | ICD-10-CM

## 2011-01-18 DIAGNOSIS — E875 Hyperkalemia: Secondary | ICD-10-CM

## 2011-01-18 LAB — CBC WITH DIFFERENTIAL/PLATELET
Basophils Absolute: 0.1 10*3/uL (ref 0.0–0.1)
EOS%: 0 % (ref 0.0–7.0)
Eosinophils Absolute: 0 10*3/uL (ref 0.0–0.5)
HCT: 29.4 % — ABNORMAL LOW (ref 38.4–49.9)
HGB: 9.8 g/dL — ABNORMAL LOW (ref 13.0–17.1)
MCH: 28.9 pg (ref 27.2–33.4)
MCV: 86.7 fL (ref 79.3–98.0)
MONO#: 0.4 10*3/uL (ref 0.1–0.9)
MONO%: 3.3 % (ref 0.0–14.0)
NEUT#: 10.9 10*3/uL — ABNORMAL HIGH (ref 1.5–6.5)
RBC: 3.39 10*6/uL — ABNORMAL LOW (ref 4.20–5.82)
RDW: 16.2 % — ABNORMAL HIGH (ref 11.0–14.6)
WBC: 12 10*3/uL — ABNORMAL HIGH (ref 4.0–10.3)
lymph#: 0.5 10*3/uL — ABNORMAL LOW (ref 0.9–3.3)
nRBC: 2 % — ABNORMAL HIGH (ref 0–0)

## 2011-01-18 LAB — BASIC METABOLIC PANEL
CO2: 21 mEq/L (ref 19–32)
Calcium: 8.8 mg/dL (ref 8.4–10.5)
Sodium: 137 mEq/L (ref 135–145)

## 2011-01-18 LAB — TECHNOLOGIST REVIEW

## 2011-01-18 NOTE — Progress Notes (Signed)
Pt here for repeat b-met & results obtained & discussed with Dr. Cyndie Chime.  Per his orders, Britta Mccreedy Neff/dietician consulted & requested she give pt renal diet/low K+ info & Dr. Cyndie Chime will talk with Dr. Bascom Levels.  Pt was notified of this.

## 2011-01-21 NOTE — Assessment & Plan Note (Signed)
I was asked by nursing to educate Calvin Mccormick on a low-potassium diet. The patient reportedly had an elevated potassium of 5.9 yesterday.  He was given Kayexalate and potassium did drop to 4.4 overnight.  However, the patient is having some abnormal lab values, including his BUN is elevated at 46 and his creatinine at 3.26, so he is being referred to his primary care physician for evaluation on his renal status.   NUTRITION DIAGNOSIS:  Food and nutrition-related knowledge deficit related to elevated potassium level as evidenced by a potassium of 5.9 yesterday and no prior need for diet education.  INTERVENTION:  I educated Calvin Mccormick on avoiding foods that were high in potassium and consuming foods only lower in potassium.  I provided him with detailed fact sheets on which foods were appropriate.  We briefly discussed his usual dietary pattern.  The patient does not typically consume foods that have lots of potassium in them and he does not share that he eats a lot of meat, so his protein intake does not appear to be too high.  However, I did discourage large portions of meat until he has been evaluated.  He has decided to eliminate dairy products since the only dairy he typically consumes is yogurt and he would like to avoid that until he has been evaluated by his doctor anyway.  The patient was given fact sheets to refer to.  I provided my contact information for his wife or for him to call me if he develops questions and I have encouraged him to call me if he needs further diet education after physician referral for his kidney issues.  MONITORING/EVALUATION (GOALS):  That patient will follow low potassium and a lower protein diet for improvement in his labs.  NEXT VISIT:  There is no followup scheduled.  The patient will call with questions.    ______________________________ Zenovia Jarred, RD, LDN Clinical Nutrition Specialist BN/MEDQ  D:  01/18/2011  T:  01/19/2011  Job:  [id number]

## 2011-01-22 ENCOUNTER — Encounter: Payer: Self-pay | Admitting: *Deleted

## 2011-01-22 NOTE — Progress Notes (Signed)
Pt. Notified to stop revlimid for now due to kidney failure.

## 2011-01-23 ENCOUNTER — Telehealth: Payer: Self-pay | Admitting: Oncology

## 2011-01-23 ENCOUNTER — Other Ambulatory Visit: Payer: Self-pay | Admitting: Oncology

## 2011-01-23 DIAGNOSIS — N179 Acute kidney failure, unspecified: Secondary | ICD-10-CM

## 2011-01-23 DIAGNOSIS — C9000 Multiple myeloma not having achieved remission: Secondary | ICD-10-CM

## 2011-01-23 DIAGNOSIS — E875 Hyperkalemia: Secondary | ICD-10-CM

## 2011-01-23 NOTE — Telephone Encounter (Signed)
Talked to pt, gave him appt for January 21st and ask him to get calendar on Monday when he come for his chemo.

## 2011-01-24 ENCOUNTER — Other Ambulatory Visit: Payer: Self-pay | Admitting: *Deleted

## 2011-01-24 NOTE — Telephone Encounter (Addendum)
THIS REQUEST WAS GIVEN TO DR.GRANFORTUNA'S NURSE, MYRTLE HARDIN,RN. VERBAL ORDER AND READ BACK TO DR.GRANFORTUNA- WILL SEE PT. ON 02/05/11 BEFORE ORDERING REVLIMID. NOTIFIED [facility].

## 2011-01-28 ENCOUNTER — Ambulatory Visit (HOSPITAL_BASED_OUTPATIENT_CLINIC_OR_DEPARTMENT_OTHER): Payer: Medicare PPO

## 2011-01-28 ENCOUNTER — Other Ambulatory Visit: Payer: Medicare PPO | Admitting: Lab

## 2011-01-28 DIAGNOSIS — Z5112 Encounter for antineoplastic immunotherapy: Secondary | ICD-10-CM

## 2011-01-28 DIAGNOSIS — N189 Chronic kidney disease, unspecified: Secondary | ICD-10-CM

## 2011-01-28 DIAGNOSIS — D649 Anemia, unspecified: Secondary | ICD-10-CM

## 2011-01-28 DIAGNOSIS — E875 Hyperkalemia: Secondary | ICD-10-CM

## 2011-01-28 DIAGNOSIS — N179 Acute kidney failure, unspecified: Secondary | ICD-10-CM

## 2011-01-28 DIAGNOSIS — D472 Monoclonal gammopathy: Secondary | ICD-10-CM

## 2011-01-28 DIAGNOSIS — C9000 Multiple myeloma not having achieved remission: Secondary | ICD-10-CM

## 2011-01-28 LAB — CBC WITH DIFFERENTIAL/PLATELET
Basophils Absolute: 0 10*3/uL (ref 0.0–0.1)
Eosinophils Absolute: 0.5 10*3/uL (ref 0.0–0.5)
HCT: 25.8 % — ABNORMAL LOW (ref 38.4–49.9)
HGB: 8.5 g/dL — ABNORMAL LOW (ref 13.0–17.1)
NEUT#: 12.1 10*3/uL — ABNORMAL HIGH (ref 1.5–6.5)
NEUT%: 79.2 % — ABNORMAL HIGH (ref 39.0–75.0)
RDW: 17.7 % — ABNORMAL HIGH (ref 11.0–14.6)
lymph#: 1.2 10*3/uL (ref 0.9–3.3)

## 2011-01-28 LAB — BASIC METABOLIC PANEL
CO2: 22 mEq/L (ref 19–32)
Calcium: 7.4 mg/dL — ABNORMAL LOW (ref 8.4–10.5)
Chloride: 101 mEq/L (ref 96–112)
Creatinine, Ser: 2.49 mg/dL — ABNORMAL HIGH (ref 0.50–1.35)
Sodium: 133 mEq/L — ABNORMAL LOW (ref 135–145)

## 2011-01-28 MED ORDER — ONDANSETRON HCL 8 MG PO TABS
8.0000 mg | ORAL_TABLET | Freq: Once | ORAL | Status: AC
  Administered 2011-01-28: 8 mg via ORAL

## 2011-01-28 MED ORDER — BORTEZOMIB CHEMO SQ INJECTION 3.5 MG (2.5MG/ML)
1.3000 mg/m2 | Freq: Once | INTRAMUSCULAR | Status: AC
  Administered 2011-01-28: 2.75 mg via SUBCUTANEOUS
  Filled 2011-01-28: qty 2.75

## 2011-01-28 NOTE — Patient Instructions (Signed)
Pt d/c'd in stable condition after Velcade injection.  Instructed pt to return as scheduled for labs/velcade on 01/31/11 and to call for any new questions or concerns.  Instructed pt per Lonna Cobb, to call his PCP if his gout worsens or persists.  He verbalized understanding.

## 2011-01-31 ENCOUNTER — Other Ambulatory Visit: Payer: Medicare PPO | Admitting: Lab

## 2011-01-31 ENCOUNTER — Ambulatory Visit (HOSPITAL_BASED_OUTPATIENT_CLINIC_OR_DEPARTMENT_OTHER): Payer: Medicare PPO

## 2011-01-31 DIAGNOSIS — D649 Anemia, unspecified: Secondary | ICD-10-CM

## 2011-01-31 DIAGNOSIS — C9000 Multiple myeloma not having achieved remission: Secondary | ICD-10-CM

## 2011-01-31 DIAGNOSIS — D472 Monoclonal gammopathy: Secondary | ICD-10-CM

## 2011-01-31 LAB — CBC WITH DIFFERENTIAL/PLATELET
Basophils Absolute: 0.1 10*3/uL (ref 0.0–0.1)
Eosinophils Absolute: 0.4 10*3/uL (ref 0.0–0.5)
HCT: 24.5 % — ABNORMAL LOW (ref 38.4–49.9)
HGB: 8.2 g/dL — ABNORMAL LOW (ref 13.0–17.1)
LYMPH%: 8.6 % — ABNORMAL LOW (ref 14.0–49.0)
MCH: 30.9 pg (ref 27.2–33.4)
MCV: 92.6 fL (ref 79.3–98.0)
MONO%: 11.9 % (ref 0.0–14.0)
NEUT#: 9 10*3/uL — ABNORMAL HIGH (ref 1.5–6.5)
NEUT%: 75.4 % — ABNORMAL HIGH (ref 39.0–75.0)
Platelets: 342 10*3/uL (ref 140–400)

## 2011-01-31 MED ORDER — ONDANSETRON HCL 8 MG PO TABS
8.0000 mg | ORAL_TABLET | Freq: Once | ORAL | Status: AC
Start: 2011-01-31 — End: 2011-01-31
  Administered 2011-01-31: 8 mg via ORAL

## 2011-01-31 MED ORDER — BORTEZOMIB CHEMO SQ INJECTION 3.5 MG (2.5MG/ML)
1.3000 mg/m2 | Freq: Once | INTRAMUSCULAR | Status: AC
  Administered 2011-01-31: 2.75 mg via SUBCUTANEOUS
  Filled 2011-01-31: qty 2.75

## 2011-01-31 NOTE — Patient Instructions (Signed)
Pt discharged home.  Pt instructed to call for questions and concerns. shk

## 2011-02-01 ENCOUNTER — Other Ambulatory Visit: Payer: Self-pay | Admitting: Certified Registered Nurse Anesthetist

## 2011-02-04 ENCOUNTER — Ambulatory Visit (HOSPITAL_BASED_OUTPATIENT_CLINIC_OR_DEPARTMENT_OTHER): Payer: Medicare PPO

## 2011-02-04 ENCOUNTER — Other Ambulatory Visit: Payer: Medicare PPO | Admitting: Lab

## 2011-02-04 DIAGNOSIS — C9000 Multiple myeloma not having achieved remission: Secondary | ICD-10-CM

## 2011-02-04 DIAGNOSIS — D649 Anemia, unspecified: Secondary | ICD-10-CM

## 2011-02-04 DIAGNOSIS — D472 Monoclonal gammopathy: Secondary | ICD-10-CM

## 2011-02-04 LAB — CBC WITH DIFFERENTIAL/PLATELET
Basophils Absolute: 0.1 10*3/uL (ref 0.0–0.1)
HCT: 25.5 % — ABNORMAL LOW (ref 38.4–49.9)
HGB: 8.3 g/dL — ABNORMAL LOW (ref 13.0–17.1)
MONO#: 1 10*3/uL — ABNORMAL HIGH (ref 0.1–0.9)
NEUT#: 6.5 10*3/uL (ref 1.5–6.5)
NEUT%: 72.3 % (ref 39.0–75.0)
WBC: 8.9 10*3/uL (ref 4.0–10.3)
lymph#: 1.2 10*3/uL (ref 0.9–3.3)

## 2011-02-04 LAB — TECHNOLOGIST REVIEW

## 2011-02-04 MED ORDER — BORTEZOMIB CHEMO SQ INJECTION 3.5 MG (2.5MG/ML)
1.3000 mg/m2 | Freq: Once | INTRAMUSCULAR | Status: AC
  Administered 2011-02-04: 2.75 mg via SUBCUTANEOUS
  Filled 2011-02-04: qty 2.75

## 2011-02-04 MED ORDER — ONDANSETRON HCL 8 MG PO TABS
8.0000 mg | ORAL_TABLET | Freq: Once | ORAL | Status: AC
  Administered 2011-02-04: 8 mg via ORAL

## 2011-02-04 NOTE — Progress Notes (Signed)
Verbal permission given Dr Marlena Clipper to teat patient with Velcade with Hgb of 8.3. DS

## 2011-02-05 ENCOUNTER — Encounter: Payer: Self-pay | Admitting: Oncology

## 2011-02-05 ENCOUNTER — Ambulatory Visit (HOSPITAL_BASED_OUTPATIENT_CLINIC_OR_DEPARTMENT_OTHER): Payer: Medicare PPO | Admitting: Oncology

## 2011-02-05 ENCOUNTER — Other Ambulatory Visit: Payer: Medicare PPO | Admitting: Lab

## 2011-02-05 VITALS — BP 114/65 | HR 72 | Temp 97.0°F | Ht 71.0 in | Wt 197.8 lb

## 2011-02-05 DIAGNOSIS — M549 Dorsalgia, unspecified: Secondary | ICD-10-CM

## 2011-02-05 DIAGNOSIS — N183 Chronic kidney disease, stage 3 unspecified: Secondary | ICD-10-CM

## 2011-02-05 DIAGNOSIS — N189 Chronic kidney disease, unspecified: Secondary | ICD-10-CM

## 2011-02-05 DIAGNOSIS — C9000 Multiple myeloma not having achieved remission: Secondary | ICD-10-CM

## 2011-02-05 DIAGNOSIS — I10 Essential (primary) hypertension: Secondary | ICD-10-CM

## 2011-02-05 HISTORY — DX: Chronic kidney disease, stage 3 unspecified: N18.30

## 2011-02-05 LAB — COMPREHENSIVE METABOLIC PANEL
AST: 15 U/L (ref 0–37)
Albumin: 3.3 g/dL — ABNORMAL LOW (ref 3.5–5.2)
Alkaline Phosphatase: 68 U/L (ref 39–117)
BUN: 37 mg/dL — ABNORMAL HIGH (ref 6–23)
Potassium: 5.3 mEq/L (ref 3.5–5.3)
Sodium: 137 mEq/L (ref 135–145)
Total Bilirubin: 0.3 mg/dL (ref 0.3–1.2)
Total Protein: 5.6 g/dL — ABNORMAL LOW (ref 6.0–8.3)

## 2011-02-05 LAB — KAPPA/LAMBDA LIGHT CHAINS
Kappa free light chain: 1.21 mg/dL (ref 0.33–1.94)
Lambda Free Lght Chn: 0.68 mg/dL (ref 0.57–2.63)

## 2011-02-05 MED ORDER — LENALIDOMIDE 10 MG PO CAPS: 5.0000 mg | ORAL_CAPSULE | Freq: Every day | ORAL | Status: DC

## 2011-02-05 NOTE — Progress Notes (Signed)
Hematology and Oncology Follow Up Visit  Calvin Mccormick [id number] [birth year] 66 y.o. 02/05/2011 1:59 PM   Principle Diagnosis: Encounter Diagnoses  Name Primary?  . Multiple myeloma without mention of remission Yes  . Multiple myeloma   . CKD (chronic kidney disease), stage III      Interim History:   Followup visit for this 66 year old man referred here in November 2012 for further evaluation of anemia, progressive renal insufficiency, and a monoclonal gammopathy. He was found to have multiple myeloma IgG kappa initial serum immunoglobulin level 3360 mg percent, free serum kappa light chains 22 mg percent, BUN 27 creatinine 2.3 calcium 8.7 albumin 3.7 hemoglobin 8.6. Urine total protein 300 mg. With free monoclonal kappa light chains in the urine. Creatinine clearance 79 mL per minute which I believe wasn't over estimation based on his serum creatinine. Metastatic bone survey showed diffuse osteopenia but no lytic lesions. Significant scoliosis and spondylosis. Bone marrow biopsy with 17% plasma cells. (12/04/2010). He was started on a program of Revlimid, dexamethasone, and subcutaneous Velcade. He is receiving a traditional schedule of the Velcade Mondays and Thursdays 2 weeks on one week rest. 14 days of Revlimid initially at 10 mg daily and Decadron 20 mg with each dose of the Velcade for a total of 40 mg weekly. Overall he is tolerating the program well with no acute toxicities.  There was a sudden rise in his serum creatinine and potassium him a baseline value of the urine 35 creatinine 2.15 potassium 4.7 on 12/27/2010 up to a potassium of 5.9 on 01/17/2011 with creatinine to 2.7 with subsequent rise in creatinine to 3.3. He was given a dose of oral Kayexalate and potassium came down to 4.4. I stopped his Revlimid at that point since it is cleared by the kidneys. Fortunately renal function has returned to his baseline with most recent value done yesterday BUN 20 creatinine 2.2. Potassium  remains high at 5.3 serum bicarbonate 20 anion gap 9 .  He is just getting over the recent viral gastroenteritis that has been circulating in her community. He had nausea vomiting anorexia and low-grade diarrhea. He continues to have chronic low back pain and asked that we refer him to orthopedics.   Medications: reviewed  Allergies: No Known Allergies  Review of Systems: Constitutional:    see above  Respiratory: low-grade cough resolving no dyspnea  Cardiovascular:  no chest pain or palpitations   Gastrointestinal: see above  Genito-Urinary:  no urinary tract symptoms  Musculoskeletal: see above  Neurologic: no headache no change in vision no paresthesias  Skin: No rash Remaining ROS negative.  Physical Exam: Blood pressure 114/65, pulse 72, temperature 97 F (36.1 C), temperature source Oral, height 5\' 11"  (1.803 m), weight 197 lb 12.8 oz (89.721 kg). Wt Readings from Last 3 Encounters:  02/05/11 197 lb 12.8 oz (89.721 kg)  01/17/11 200 lb 3.2 oz (90.81 kg)  01/04/11 201 lb 8 oz (91.4 kg)     General appearance:  well-nourished African American man  HENNT: Missing multiple teeth. Arcus senilis. Pharynx no erythema or exudate  Lymph nodes: No adenopathy  Breasts: Lungs: clear to auscultation resonant to percussion  Heart: regular cardiac rhythm no murmur or gallop  Abdomen:Soft nontender no mass no organomegaly  Extremities: no edema no calf tenderness  Vascular:no cyanosis  Neurologic: motor strength 5 over 5 reflexes 1+ symmetric  Skin: no rash or ecchymosis   Lab Results: Lab Results  Component Value Date   WBC 8.9 02/04/2011  HGB 8.3* 02/04/2011   HCT 25.5* 02/04/2011   MCV 90.4 02/04/2011   PLT 303 02/04/2011     Chemistry      Component Value Date/Time   NA 137 02/04/2011 1045   K 5.3 02/04/2011 1045   CL 108 02/04/2011 1045   CO2 20 02/04/2011 1045   BUN 37* 02/04/2011 1045   CREATININE 2.21* 02/04/2011 1045   CREATININE 2.03* 12/14/2010 1114      Component  Value Date/Time   CALCIUM 8.4 02/04/2011 1045   ALKPHOS 68 02/04/2011 1045   AST 15 02/04/2011 1045   ALT 9 02/04/2011 1045   BILITOT 0.3 02/04/2011 1045       Radiological Studies:   Impression and Plan: #1. IgG kappa multiple myeloma #2. Chronic renal insufficiency likely multifactorial long-standing hypertension and current myeloma. #3. Essential hypertension #4. Chronic back pain secondary to scoliosis and spondylosis  I'm going to decrease the Revlimid down to 5 mg daily x14 days for each 3 week cycle getting with cycle 3 on February 11 continue Velcade at same dose since this is not cleared in  the kidneys. Repeat immunoglobulin studies and initiation of cycle #3 to assess progress.   He would like an orthopedic referral and I will have him evaluated by [facility] orthopedics group.  Dr. Bascom Levels is  assisting in the management of his renal insufficiency.   CC:.  Dr. Jeri Cos          Dr. Marlowe Kays at [facility] orthopedics   Levert Feinstein, MD 1/29/20131:59 PM

## 2011-02-07 ENCOUNTER — Other Ambulatory Visit (HOSPITAL_BASED_OUTPATIENT_CLINIC_OR_DEPARTMENT_OTHER): Payer: Medicare PPO | Admitting: Lab

## 2011-02-07 ENCOUNTER — Telehealth: Payer: Self-pay | Admitting: Oncology

## 2011-02-07 ENCOUNTER — Ambulatory Visit (HOSPITAL_BASED_OUTPATIENT_CLINIC_OR_DEPARTMENT_OTHER): Payer: Medicare PPO

## 2011-02-07 ENCOUNTER — Other Ambulatory Visit: Payer: Self-pay | Admitting: Oncology

## 2011-02-07 DIAGNOSIS — C9000 Multiple myeloma not having achieved remission: Secondary | ICD-10-CM

## 2011-02-07 DIAGNOSIS — Z5112 Encounter for antineoplastic immunotherapy: Secondary | ICD-10-CM

## 2011-02-07 DIAGNOSIS — D472 Monoclonal gammopathy: Secondary | ICD-10-CM

## 2011-02-07 DIAGNOSIS — D649 Anemia, unspecified: Secondary | ICD-10-CM

## 2011-02-07 LAB — CBC WITH DIFFERENTIAL/PLATELET
Basophils Absolute: 0.1 10*3/uL (ref 0.0–0.1)
Eosinophils Absolute: 0.1 10*3/uL (ref 0.0–0.5)
HCT: 27.6 % — ABNORMAL LOW (ref 38.4–49.9)
HGB: 9.1 g/dL — ABNORMAL LOW (ref 13.0–17.1)
MONO#: 0.7 10*3/uL (ref 0.1–0.9)
NEUT#: 8.9 10*3/uL — ABNORMAL HIGH (ref 1.5–6.5)
NEUT%: 79 % — ABNORMAL HIGH (ref 39.0–75.0)
RDW: 17.4 % — ABNORMAL HIGH (ref 11.0–14.6)
lymph#: 1.5 10*3/uL (ref 0.9–3.3)

## 2011-02-07 LAB — TECHNOLOGIST REVIEW

## 2011-02-07 MED ORDER — ONDANSETRON HCL 8 MG PO TABS
8.0000 mg | ORAL_TABLET | Freq: Once | ORAL | Status: AC
  Administered 2011-02-07: 8 mg via ORAL

## 2011-02-07 MED ORDER — BORTEZOMIB CHEMO SQ INJECTION 3.5 MG (2.5MG/ML)
1.3000 mg/m2 | Freq: Once | INTRAMUSCULAR | Status: AC
  Administered 2011-02-07: 2.75 mg via SUBCUTANEOUS
  Filled 2011-02-07: qty 2.75

## 2011-02-07 NOTE — Telephone Encounter (Signed)
Gave pt appt for Dr. August Saucer , for Orthopedic consult. Gave him address and telephone number

## 2011-02-07 NOTE — Patient Instructions (Signed)
Drink lots of fluids, call as needed.

## 2011-02-07 NOTE — Telephone Encounter (Signed)
Pt will be seen by Dr. August Saucer , [facility] on 02/13/11 2:45pm, filled out a referral form gave to HIM so records can be fax to MD

## 2011-02-18 ENCOUNTER — Ambulatory Visit (HOSPITAL_BASED_OUTPATIENT_CLINIC_OR_DEPARTMENT_OTHER): Payer: Medicare PPO

## 2011-02-18 ENCOUNTER — Other Ambulatory Visit: Payer: Medicare PPO | Admitting: Lab

## 2011-02-18 DIAGNOSIS — D649 Anemia, unspecified: Secondary | ICD-10-CM

## 2011-02-18 DIAGNOSIS — C9000 Multiple myeloma not having achieved remission: Secondary | ICD-10-CM

## 2011-02-18 DIAGNOSIS — Z5112 Encounter for antineoplastic immunotherapy: Secondary | ICD-10-CM

## 2011-02-18 DIAGNOSIS — D472 Monoclonal gammopathy: Secondary | ICD-10-CM

## 2011-02-18 LAB — CBC WITH DIFFERENTIAL/PLATELET
BASO%: 0.2 % (ref 0.0–2.0)
Basophils Absolute: 0 10*3/uL (ref 0.0–0.1)
Eosinophils Absolute: 0.3 10*3/uL (ref 0.0–0.5)
HCT: 26.6 % — ABNORMAL LOW (ref 38.4–49.9)
HGB: 8.8 g/dL — ABNORMAL LOW (ref 13.0–17.1)
MCHC: 33 g/dL (ref 32.0–36.0)
MONO#: 0.7 10*3/uL (ref 0.1–0.9)
NEUT#: 6.9 10*3/uL — ABNORMAL HIGH (ref 1.5–6.5)
NEUT%: 76.2 % — ABNORMAL HIGH (ref 39.0–75.0)
Platelets: 266 10*3/uL (ref 140–400)
WBC: 9.1 10*3/uL (ref 4.0–10.3)
lymph#: 1.2 10*3/uL (ref 0.9–3.3)

## 2011-02-18 MED ORDER — BORTEZOMIB CHEMO SQ INJECTION 3.5 MG (2.5MG/ML)
1.3000 mg/m2 | Freq: Once | INTRAMUSCULAR | Status: AC
  Administered 2011-02-18: 2.75 mg via SUBCUTANEOUS
  Filled 2011-02-18: qty 2.75

## 2011-02-18 MED ORDER — ONDANSETRON HCL 8 MG PO TABS
8.0000 mg | ORAL_TABLET | Freq: Once | ORAL | Status: AC
  Administered 2011-02-18: 8 mg via ORAL

## 2011-02-18 NOTE — Patient Instructions (Signed)
Patient aware of next appointment; discharged home with no complaints; message left at nurses desk to follow up with patient concerning Revlimid reduced dosage.

## 2011-02-20 LAB — IMMUNOFIXATION ELECTROPHORESIS
IgG (Immunoglobin G), Serum: 790 mg/dL (ref 650–1600)
IgM, Serum: 14 mg/dL — ABNORMAL LOW (ref 41–251)
Total Protein, Serum Electrophoresis: 5.5 g/dL — ABNORMAL LOW (ref 6.0–8.3)

## 2011-02-20 LAB — COMPREHENSIVE METABOLIC PANEL
ALT: 11 U/L (ref 0–53)
CO2: 20 mEq/L (ref 19–32)
Calcium: 8.9 mg/dL (ref 8.4–10.5)
Chloride: 107 mEq/L (ref 96–112)
Creatinine, Ser: 1.96 mg/dL — ABNORMAL HIGH (ref 0.50–1.35)
Glucose, Bld: 156 mg/dL — ABNORMAL HIGH (ref 70–99)

## 2011-02-21 ENCOUNTER — Ambulatory Visit (HOSPITAL_BASED_OUTPATIENT_CLINIC_OR_DEPARTMENT_OTHER): Payer: Medicare PPO

## 2011-02-21 ENCOUNTER — Encounter: Payer: Self-pay | Admitting: *Deleted

## 2011-02-21 ENCOUNTER — Other Ambulatory Visit: Payer: Medicare PPO | Admitting: Lab

## 2011-02-21 DIAGNOSIS — D649 Anemia, unspecified: Secondary | ICD-10-CM

## 2011-02-21 DIAGNOSIS — D472 Monoclonal gammopathy: Secondary | ICD-10-CM

## 2011-02-21 DIAGNOSIS — C9000 Multiple myeloma not having achieved remission: Secondary | ICD-10-CM

## 2011-02-21 DIAGNOSIS — Z5112 Encounter for antineoplastic immunotherapy: Secondary | ICD-10-CM

## 2011-02-21 MED ORDER — BORTEZOMIB CHEMO SQ INJECTION 3.5 MG (2.5MG/ML)
1.3000 mg/m2 | Freq: Once | INTRAMUSCULAR | Status: AC
  Administered 2011-02-21: 2.75 mg via SUBCUTANEOUS
  Filled 2011-02-21: qty 2.75

## 2011-02-21 MED ORDER — ONDANSETRON HCL 8 MG PO TABS
8.0000 mg | ORAL_TABLET | Freq: Once | ORAL | Status: AC
  Administered 2011-02-21: 8 mg via ORAL

## 2011-02-21 NOTE — Patient Instructions (Signed)
Call cancer center for any problems.  Pt and CG verbalized understanding.

## 2011-02-25 ENCOUNTER — Other Ambulatory Visit: Payer: Medicare PPO | Admitting: Lab

## 2011-02-25 ENCOUNTER — Ambulatory Visit (HOSPITAL_BASED_OUTPATIENT_CLINIC_OR_DEPARTMENT_OTHER): Payer: Medicare PPO

## 2011-02-25 DIAGNOSIS — D472 Monoclonal gammopathy: Secondary | ICD-10-CM

## 2011-02-25 DIAGNOSIS — C9000 Multiple myeloma not having achieved remission: Secondary | ICD-10-CM

## 2011-02-25 DIAGNOSIS — D649 Anemia, unspecified: Secondary | ICD-10-CM

## 2011-02-25 LAB — CBC WITH DIFFERENTIAL/PLATELET
Basophils Absolute: 0 10*3/uL (ref 0.0–0.1)
EOS%: 0.5 % (ref 0.0–7.0)
HCT: 26.5 % — ABNORMAL LOW (ref 38.4–49.9)
HGB: 9 g/dL — ABNORMAL LOW (ref 13.0–17.1)
LYMPH%: 13.1 % — ABNORMAL LOW (ref 14.0–49.0)
MCH: 29.9 pg (ref 27.2–33.4)
MCV: 88 fL (ref 79.3–98.0)
MONO%: 8.1 % (ref 0.0–14.0)
NEUT%: 78.1 % — ABNORMAL HIGH (ref 39.0–75.0)
Platelets: 287 10*3/uL (ref 140–400)
lymph#: 1.3 10*3/uL (ref 0.9–3.3)

## 2011-02-25 LAB — TECHNOLOGIST REVIEW

## 2011-02-25 MED ORDER — ONDANSETRON HCL 8 MG PO TABS
8.0000 mg | ORAL_TABLET | Freq: Once | ORAL | Status: AC
  Administered 2011-02-25: 8 mg via ORAL

## 2011-02-25 MED ORDER — BORTEZOMIB CHEMO SQ INJECTION 3.5 MG (2.5MG/ML)
1.3000 mg/m2 | Freq: Once | INTRAMUSCULAR | Status: AC
  Administered 2011-02-25: 2.75 mg via SUBCUTANEOUS
  Filled 2011-02-25: qty 2.75

## 2011-02-28 ENCOUNTER — Ambulatory Visit (HOSPITAL_BASED_OUTPATIENT_CLINIC_OR_DEPARTMENT_OTHER): Payer: Medicare PPO

## 2011-02-28 ENCOUNTER — Other Ambulatory Visit: Payer: Medicare PPO

## 2011-02-28 DIAGNOSIS — C9000 Multiple myeloma not having achieved remission: Secondary | ICD-10-CM

## 2011-02-28 DIAGNOSIS — D472 Monoclonal gammopathy: Secondary | ICD-10-CM

## 2011-02-28 DIAGNOSIS — D649 Anemia, unspecified: Secondary | ICD-10-CM

## 2011-02-28 DIAGNOSIS — Z5112 Encounter for antineoplastic immunotherapy: Secondary | ICD-10-CM

## 2011-02-28 MED ORDER — ONDANSETRON HCL 8 MG PO TABS
8.0000 mg | ORAL_TABLET | Freq: Once | ORAL | Status: AC
  Administered 2011-02-28: 8 mg via ORAL

## 2011-02-28 MED ORDER — BORTEZOMIB CHEMO SQ INJECTION 3.5 MG (2.5MG/ML)
1.3000 mg/m2 | Freq: Once | INTRAMUSCULAR | Status: AC
  Administered 2011-02-28: 2.75 mg via SUBCUTANEOUS
  Filled 2011-02-28: qty 2.75

## 2011-03-03 ENCOUNTER — Encounter (HOSPITAL_COMMUNITY): Payer: Self-pay | Admitting: Emergency Medicine

## 2011-03-03 ENCOUNTER — Emergency Department (HOSPITAL_COMMUNITY)
Admission: EM | Admit: 2011-03-03 | Discharge: 2011-03-03 | Disposition: A | Discharge status: Home / Self Care | Payer: Medicare PPO | Attending: Emergency Medicine | Admitting: Emergency Medicine

## 2011-03-03 DIAGNOSIS — I10 Essential (primary) hypertension: Secondary | ICD-10-CM | POA: Insufficient documentation

## 2011-03-03 DIAGNOSIS — C9000 Multiple myeloma not having achieved remission: Secondary | ICD-10-CM | POA: Insufficient documentation

## 2011-03-03 DIAGNOSIS — R202 Paresthesia of skin: Secondary | ICD-10-CM

## 2011-03-03 DIAGNOSIS — Z79899 Other long term (current) drug therapy: Secondary | ICD-10-CM | POA: Insufficient documentation

## 2011-03-03 DIAGNOSIS — R209 Unspecified disturbances of skin sensation: Secondary | ICD-10-CM | POA: Insufficient documentation

## 2011-03-03 HISTORY — DX: Hyperlipidemia, unspecified: E78.5

## 2011-03-03 LAB — BASIC METABOLIC PANEL
BUN: 41 mg/dL — ABNORMAL HIGH (ref 6–23)
CO2: 18 mEq/L — ABNORMAL LOW (ref 19–32)
Calcium: 8.8 mg/dL (ref 8.4–10.5)
Creatinine, Ser: 2.08 mg/dL — ABNORMAL HIGH (ref 0.50–1.35)
GFR calc non Af Amer: 31 mL/min — ABNORMAL LOW (ref >90)
Glucose, Bld: 161 mg/dL — ABNORMAL HIGH (ref 70–99)

## 2011-03-03 LAB — DIFFERENTIAL
Basophils Absolute: 0 10*3/uL (ref 0.0–0.1)
Basophils Relative: 0 % (ref 0–1)
Eosinophils Absolute: 0 10*3/uL (ref 0.0–0.7)
Lymphocytes Relative: 10 % — ABNORMAL LOW (ref 12–46)
Lymphs Abs: 1.2 10*3/uL (ref 0.7–4.0)
Monocytes Absolute: 1.1 10*3/uL — ABNORMAL HIGH (ref 0.1–1.0)
Neutro Abs: 9.7 10*3/uL — ABNORMAL HIGH (ref 1.7–7.7)

## 2011-03-03 LAB — CBC
HCT: 28.1 % — ABNORMAL LOW (ref 39.0–52.0)
MCH: 30.6 pg (ref 26.0–34.0)
MCHC: 34.5 g/dL (ref 30.0–36.0)
MCV: 88.6 fL (ref 78.0–100.0)
RDW: 17.4 % — ABNORMAL HIGH (ref 11.5–15.5)

## 2011-03-03 MED ORDER — SODIUM CHLORIDE 0.9 % IV BOLUS (SEPSIS)
1000.0000 mL | Freq: Once | INTRAVENOUS | Status: AC
  Administered 2011-03-03: 1000 mL via INTRAVENOUS

## 2011-03-03 MED ORDER — HYDROCODONE-ACETAMINOPHEN 5-325 MG PO TABS
1.0000 | ORAL_TABLET | Freq: Once | ORAL | Status: AC
  Administered 2011-03-03: 1 via ORAL
  Filled 2011-03-03: qty 1

## 2011-03-03 MED ORDER — HYDROCODONE-ACETAMINOPHEN 5-325 MG PO TABS: 1.0000 | ORAL_TABLET | ORAL | Status: AC | PRN

## 2011-03-03 NOTE — ED Notes (Signed)
Pt waiting for bolus fluid to finish

## 2011-03-03 NOTE — Discharge Instructions (Signed)
Neuropathy Neuropathy means your peripheral nerves are not working normally. Peripheral nerves are the nerves outside the brain and spinal cord. Messages between the brain and the rest of the body do not work properly with peripheral nerve disorders. CAUSES There are many different causes of peripheral nerve disorders. These include:  Injury.   Infections.   Diabetes.   Vitamin deficiency.   Poor circulation.   Alcoholism.   Exposure to toxins.   Drug effects.   Tumors.   Kidney disease.  SYMPTOMS  Tingling, burning, pain, and numbness in the extremities.   Weakness and loss of muscle tone and size.  DIAGNOSIS Blood tests and special studies of nerve function may help confirm the diagnosis.  TREATMENT  Treatment includes adopting healthy life habits.   A good diet, vitamin supplements, and mild pain medicine may be needed.   Avoid known toxins such as alcohol, tobacco, and recreational drugs.   Anti-convulsant medicines are helpful in some types of neuropathy.  Make a follow-up appointment with your caregiver to be sure you are getting better with treatment.  SEEK IMMEDIATE MEDICAL CARE IF:   You have breathing problems.   You have severe or uncontrolled pain.   You notice extreme weakness or you feel faint.   You are not better after 1 week or if you have worse symptoms.  Document Released: 02/01/2004 Document Revised: 09/05/2010 Document Reviewed: 12/24/2004 [employer] Patient Information 2012 [employer]. 

## 2011-03-03 NOTE — ED Notes (Signed)
Pt given discharge instructions and rx, verbalized understanding and pt assisted to lobby with family in adult stroller.

## 2011-03-03 NOTE — ED Provider Notes (Signed)
History     CSN: [id number]  Arrival date & time 03/03/11  1022   First MD Initiated Contact with Patient 03/03/11 1024      No chief complaint on file.   (Consider location/radiation/quality/duration/timing/severity/associated sxs/prior treatment) HPI Comments: Patient presents complaining of bilateral hand and feet pain.  Patient notes that this vague cold feeling and pain has been ongoing for at least the last several weeks.  He states that last night worsened and he had an increase in his pain in both his hands and his feet.  He denies any numbness or weakness.  He denies any.  Patient does have a history of multiple myeloma and has been undergoing his treatments for this.  Patient is chronically taking his tramadol and states that this is not helping his hand and feet pain.  Patient denies any new fevers.  Patient otherwise denies any other new symptoms such as cough, chest pain, nausea, vomiting, diarrhea or other symptoms.  Patient is a 66 y.o. male presenting with lower extremity pain. The history is provided by the patient. No language interpreter was used.  Foot Pain This is a chronic problem. The problem occurs constantly. The problem has been gradually worsening. Pertinent negatives include no chest pain, no abdominal pain, no headaches and no shortness of breath. The symptoms are aggravated by walking. The symptoms are relieved by nothing. Treatments tried: Patient is chronically on tramadol for chronic pain. The treatment provided no relief.    Past Medical History  Diagnosis Date  . Anemia 11/27/2010  . Hypertension, benign essential, goal below 140/90 11/27/2010  . Multiple myeloma without mention of remission 12/12/2010  . Multiple myeloma without mention of remission 12/12/2010  . CKD (chronic kidney disease), stage III 02/05/2011    History reviewed. No pertinent past surgical history.  History reviewed. No pertinent family history.  History  Substance Use Topics  .  Smoking status: Not on file  . Smokeless tobacco: Not on file  . Alcohol Use: Not on file      Review of Systems  Constitutional: Negative.  Negative for fever and chills.  HENT: Negative.   Eyes: Negative.  Negative for discharge and redness.  Respiratory: Negative.  Negative for cough and shortness of breath.   Cardiovascular: Negative.  Negative for chest pain.  Gastrointestinal: Negative.  Negative for nausea, vomiting and abdominal pain.  Genitourinary: Negative.  Negative for hematuria.  Musculoskeletal: Negative for back pain.  Skin: Negative.  Negative for color change and rash.  Neurological: Negative for syncope and headaches.  Hematological: Negative for adenopathy.       Multiple myeloma  Psychiatric/Behavioral: Negative.  Negative for confusion.  All other systems reviewed and are negative.    Allergies  Review of patient's allergies indicates no known allergies.  Home Medications   Current Outpatient Rx  Name Route Sig Dispense Refill  . ALLOPURINOL 100 MG PO TABS Oral Take 100 mg by mouth as needed.      . ATORVASTATIN CALCIUM 20 MG PO TABS Oral Take 20 mg by mouth daily.      Marland Kitchen CLONIDINE HCL 0.3 MG PO TABS Oral Take 0.3 mg by mouth 2 (two) times daily.      Marland Kitchen DEXAMETHASONE 4 MG PO TABS Oral Take 20 mg by mouth. 5 pills po day of chemo injection     . ESOMEPRAZOLE MAGNESIUM 40 MG PO CPDR Oral Take 40 mg by mouth daily before breakfast.      . FOLIC ACID 1  MG PO TABS Oral Take 1 mg by mouth daily.      Marland Kitchen LENALIDOMIDE 10 MG PO CAPS Oral Take 1 capsule (10 mg total) by mouth daily. 1 tab = 10mg  po x 14 days then 7 day rest  Auth # [id number] 14 capsule 0  . ONDANSETRON 8 MG PO TBDP Oral Take 8 mg by mouth every 8 (eight) hours as needed.      Marland Kitchen RAMIPRIL 2.5 MG PO TABS Oral Take 2.5 mg by mouth daily.      Marland Kitchen SILDENAFIL CITRATE 50 MG PO TABS Oral Take 50 mg by mouth daily as needed.      . SODIUM POLYSTYRENE SULFONATE PO POWD  15g/70ml-- take 30g =158ml tonight ASAP &  repeat in am. 60 g 0    This was called to [facility] in [place] [phone]  . SULINDAC 150 MG PO TABS Oral Take 150 mg by mouth 2 (two) times daily.      . TRAMADOL HCL 50 MG PO TABS Oral Take 50 mg by mouth every 6 (six) hours as needed. Maximum dose= 8 tablets per day     . TRIAMTERENE-HCTZ 37.5-25 MG PO TABS Oral Take 1 tablet by mouth daily.      Marland Kitchen VALACYCLOVIR HCL 500 MG PO TABS Oral Take 1 tablet (500 mg total) by mouth once. 30 tablet 4  . VITAMIN E 400 UNITS PO CAPS Oral Take 400 Units by mouth daily.        BP 143/66  Pulse 69  Temp(Src) 97.7 F (36.5 C) (Oral)  Resp 18  SpO2 100%  Physical Exam  Nursing note and vitals reviewed. Constitutional: He is oriented to person, place, and time. He appears well-developed and well-nourished.  Non-toxic appearance. He does not have a sickly appearance.  HENT:  Head: Normocephalic and atraumatic.  Eyes: Conjunctivae, EOM and lids are normal. Pupils are equal, round, and reactive to light.  Neck: Trachea normal, normal range of motion and full passive range of motion without pain. Neck supple.  Cardiovascular: Normal rate, regular rhythm and normal heart sounds.   Pulmonary/Chest: Effort normal and breath sounds normal. No respiratory distress.  Abdominal: Soft. Normal appearance. He exhibits no distension. There is no tenderness. There is no rebound and no CVA tenderness.  Musculoskeletal: Normal range of motion.       Patient has palpable radial and DP pulses bilaterally.  Capillary refill is less than 2 seconds in his fingertips and in his feet.  Patient has sensation to light touch intact on both his feet and his hands.  Patient has full range of motion was able to bear weight here in the emergency department.  There is no rash, redness or swelling of his hands or feet.  Neurological: He is alert and oriented to person, place, and time. He has normal strength.  Skin: Skin is warm, dry and intact. No rash noted.  Psychiatric: He  has a normal mood and affect. His behavior is normal. Judgment and thought content normal.    ED Course  Procedures (including critical care time)  Results for orders placed during the hospital encounter of 03/03/11  CBC      Component Value Range   WBC 12.0 (*) 4.0 - 10.5 (K/uL)   RBC 3.17 (*) 4.22 - 5.81 (MIL/uL)   Hemoglobin 9.7 (*) 13.0 - 17.0 (g/dL)   HCT 86.5 (*) 78.4 - 52.0 (%)   MCV 88.6  78.0 - 100.0 (fL)   MCH  30.6  26.0 - 34.0 (pg)   MCHC 34.5  30.0 - 36.0 (g/dL)   RDW 96.0 (*) 45.4 - 15.5 (%)   Platelets 220  150 - 400 (K/uL)  DIFFERENTIAL      Component Value Range   Neutrophils Relative 81 (*) 43 - 77 (%)   Lymphocytes Relative 10 (*) 12 - 46 (%)   Monocytes Relative 9  3 - 12 (%)   Eosinophils Relative 0  0 - 5 (%)   Basophils Relative 0  0 - 1 (%)   Neutro Abs 9.7 (*) 1.7 - 7.7 (K/uL)   Lymphs Abs 1.2  0.7 - 4.0 (K/uL)   Monocytes Absolute 1.1 (*) 0.1 - 1.0 (K/uL)   Eosinophils Absolute 0.0  0.0 - 0.7 (K/uL)   Basophils Absolute 0.0  0.0 - 0.1 (K/uL)   RBC Morphology RARE NRBCs     WBC Morphology MILD LEFT SHIFT (1-5% METAS, OCC MYELO, OCC BANDS)    BASIC METABOLIC PANEL      Component Value Range   Sodium 129 (*) 135 - 145 (mEq/L)   Potassium 4.2  3.5 - 5.1 (mEq/L)   Chloride 100  96 - 112 (mEq/L)   CO2 18 (*) 19 - 32 (mEq/L)   Glucose, Bld 161 (*) 70 - 99 (mg/dL)   BUN 41 (*) 6 - 23 (mg/dL)   Creatinine, Ser 0.98 (*) 0.50 - 1.35 (mg/dL)   Calcium 8.8  8.4 - 11.9 (mg/dL)   GFR calc non Af Amer 31 (*) >90 (mL/min)   GFR calc Af Amer 36 (*) >90 (mL/min)  MAGNESIUM      Component Value Range   Magnesium 1.6  1.5 - 2.5 (mg/dL)      MDM  Mr. Ruscitti pain is improved with the one Norco tablet here.  Patient has no significant left leg abnormalities that appear to be the cause for his increasing paresthesias and pain in his hands and feet.  His sodium is slightly low he is going to receive 1 L of normal saline here to help improve that.  Patient's  creatinine is elevated but at his baseline.  I've encouraged the patient to followup with both his primary care physician Dr. Bascom Levels and Dr. Cyndie Chime  as scheduled.  The patient actually has followup with Dr. Bascom Levels on Tuesday and I've encouraged him to discuss further pain control with him since he writes for his baseline tramadol for his back pain.  The patient's symptoms may be related to his multiple myeloma or a complication of his treatment for it.  I discussed this with the patient and his family again encouraged him to discuss this further with his oncologist.        Nat Christen, MD 03/03/11 [phone]

## 2011-03-03 NOTE — ED Notes (Signed)
Pt still not finish with bolus fluid

## 2011-03-03 NOTE — ED Notes (Signed)
Pt here from home with c/o numbness and tingling to all 4 extremities.

## 2011-03-09 ENCOUNTER — Other Ambulatory Visit: Payer: Self-pay | Admitting: Oncology

## 2011-03-11 ENCOUNTER — Other Ambulatory Visit: Payer: Self-pay

## 2011-03-11 ENCOUNTER — Other Ambulatory Visit: Payer: Medicare PPO | Admitting: Lab

## 2011-03-11 ENCOUNTER — Ambulatory Visit (HOSPITAL_BASED_OUTPATIENT_CLINIC_OR_DEPARTMENT_OTHER): Payer: Medicare PPO | Admitting: Nurse Practitioner

## 2011-03-11 ENCOUNTER — Ambulatory Visit (HOSPITAL_BASED_OUTPATIENT_CLINIC_OR_DEPARTMENT_OTHER): Payer: Medicare PPO

## 2011-03-11 ENCOUNTER — Telehealth: Payer: Self-pay | Admitting: Oncology

## 2011-03-11 VITALS — BP 129/66 | HR 74 | Temp 98.3°F | Ht 71.0 in | Wt 190.8 lb

## 2011-03-11 DIAGNOSIS — I10 Essential (primary) hypertension: Secondary | ICD-10-CM

## 2011-03-11 DIAGNOSIS — N189 Chronic kidney disease, unspecified: Secondary | ICD-10-CM

## 2011-03-11 DIAGNOSIS — C9000 Multiple myeloma not having achieved remission: Secondary | ICD-10-CM

## 2011-03-11 DIAGNOSIS — Z5112 Encounter for antineoplastic immunotherapy: Secondary | ICD-10-CM

## 2011-03-11 DIAGNOSIS — G609 Hereditary and idiopathic neuropathy, unspecified: Secondary | ICD-10-CM

## 2011-03-11 DIAGNOSIS — D472 Monoclonal gammopathy: Secondary | ICD-10-CM

## 2011-03-11 DIAGNOSIS — D649 Anemia, unspecified: Secondary | ICD-10-CM

## 2011-03-11 LAB — CBC WITH DIFFERENTIAL/PLATELET
BASO%: 0.1 % (ref 0.0–2.0)
HCT: 23.3 % — ABNORMAL LOW (ref 38.4–49.9)
LYMPH%: 16.2 % (ref 14.0–49.0)
MCHC: 33 g/dL (ref 32.0–36.0)
MCV: 94.1 fL (ref 79.3–98.0)
MONO#: 0.9 10*3/uL (ref 0.1–0.9)
MONO%: 9.7 % (ref 0.0–14.0)
NEUT%: 73.5 % (ref 39.0–75.0)
Platelets: 254 10*3/uL (ref 140–400)
RBC: 2.47 10*6/uL — ABNORMAL LOW (ref 4.20–5.82)

## 2011-03-11 MED ORDER — LENALIDOMIDE 5 MG PO CAPS: 5.0000 mg | ORAL_CAPSULE | Freq: Every day | ORAL | Status: DC

## 2011-03-11 MED ORDER — ONDANSETRON HCL 8 MG PO TABS
8.0000 mg | ORAL_TABLET | Freq: Once | ORAL | Status: AC
  Administered 2011-03-11: 8 mg via ORAL

## 2011-03-11 MED ORDER — BORTEZOMIB CHEMO SQ INJECTION 3.5 MG (2.5MG/ML)
1.3000 mg/m2 | Freq: Once | INTRAMUSCULAR | Status: AC
  Administered 2011-03-11: 2.75 mg via SUBCUTANEOUS
  Filled 2011-03-11: qty 2.75

## 2011-03-11 NOTE — Progress Notes (Signed)
OFFICE PROGRESS NOTE  Interval history:  [name] is a 66 year old man with IgG kappa multiple myeloma. He is being treated with Revlimid/Velcade/dexamethasone. The Velcade is given twice weekly 2 weeks on followed by a one-week break. He takes Decadron 20 mg on the days he takes Velcade. The Revlimid dose was decreased from 10 mg daily for 14 days every 21 days to 5 mg daily every 14 days for 21 days beginning 02/18/2011 due to renal dysfunction. Most recent serum IgG was further improved at 790 on 02/18/2011 (936 on 02/04/2011, 2360 on 12/27/2010).  Mr. Melendrez reports that he is not taking Revlimid at present. He was not aware that he was supposed to resume it. He denies nausea/vomiting. No mouth sores. No diarrhea or constipation. He notes numbness in his hands and feet over the past one month. He has had some difficulty buttoning his shirt. He denies balance problems. He is walking without difficulty.   Objective: Blood pressure 129/66, pulse 74, temperature 98.3 F (36.8 C), temperature source Oral, height 5\' 11"  (1.803 m), weight 190 lb 12.8 oz (86.546 kg).  Oropharynx is without thrush or ulceration. Lungs are clear. No wheezes or rales. Regular cardiac rhythm. Abdomen is soft and nontender. No organomegaly. Trace lower leg edema bilaterally. Calves are soft and nontender. Motor strength 5 over 5. Vibratory sense is moderately decreased over the fingertips bilaterally.  Lab Results: Lab Results  Component Value Date   WBC 9.3 03/11/2011   HGB 7.7* 03/11/2011   HCT 23.3* 03/11/2011   MCV 94.1 03/11/2011   PLT 254 03/11/2011    Chemistry:    Chemistry      Component Value Date/Time   NA 129* 03/03/2011 1035   K 4.2 03/03/2011 1035   CL 100 03/03/2011 1035   CO2 18* 03/03/2011 1035   BUN 41* 03/03/2011 1035   CREATININE 2.08* 03/03/2011 1035   CREATININE 2.03* 12/14/2010 1114      Component Value Date/Time   CALCIUM 8.8 03/03/2011 1035   ALKPHOS 91 02/18/2011 0829   AST 15 02/18/2011 0829   ALT 11 02/18/2011 0829   BILITOT 0.6 02/18/2011 0829       Studies/Results: No results found.  Medications: I have reviewed the patient's current medications.  Assessment/Plan:  1. IgG kappa multiple myeloma-initially referred for further evaluation of anemia and an elevated monoclonal IgG immunoglobulin. Hemoglobin on referral 8.6; IgG 3360 mg with suppression of IgA 37 and IgM 9; IFE with IgG kappa light chain restriction; serum free light chains with kappa 22.1 mg, lambda 1.3 and ratio 16.9. Additional studies done through our office showed renal dysfunction with BUN 27 and creatinine 2.3, calcium normal at 8.7 with albumin 3.7, repeat hemoglobin 9.4 with white count 7000, platelets 305,000, normal white count differential (61 neutrophils, 30 lymphocytes, 5 monocytes). Skeletal bone survey 11/26/10 with mild osteopenia throughout the spine, spondylosis throughout the spine, no suspicious focal lytic lesions. Bone marrow aspiration and biopsy 12/04/10 showed 17% plasma cells positive for CD138, kappa and lambda stains showed kappa light chain restriction. Many of the plasma cells had atypical features. Large clusters or sheets of plasma cells were not identified. He completed the first cycle of Velcade 12/10, 12/13, 12/17 and 12/27/2010. He completed the first cycle of Revlimid beginning 12/19/2010. He takes Decadron at 20 mg on the days he receives Velcade. He has now completed 4 cycles of Velcade/Decadron. Revlimid was placed on hold approximately 01/17/2011 due to a rise in the serum creatinine. The Revlimid dose was  adjusted to 5 mg daily for 14 days every 21 days beginning 02/18/2011. Mr. Colledge was unaware that he was supposed to resume the Revlimid. The serum IgG was further improved on 02/18/2011.  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 We will adjust the Velcade schedule to weekly x3 followed by a one-week break beginning with cycle 5 today.  3. Chronic renal insufficiency  likely due to long-standing hypertension and multiple myeloma.  4. Hypertension.  5. Chronic back pain secondary to scoliosis and spondylosis.  Disposition-per Dr. Patsy Lager recommendation the Velcade dosing schedule will be adjusted to weekly x3 followed by a one-week break beginning with cycle #5 today. He will continue dexamethasone 20 mg twice weekly. He will resume Revlimid at the reduced dose of 5 mg daily for 14 days followed by a 7 day break. He will return for a followup visit and cycle 6 in one month. He will contact the office the interim with any problems.  Plan reviewed with Dr. Cyndie Chime.   Lonna Cobb ANP/GNP-BC

## 2011-03-11 NOTE — Telephone Encounter (Signed)
Gave pt appt for March, April and May 2013

## 2011-03-13 ENCOUNTER — Encounter: Payer: Self-pay | Admitting: Oncology

## 2011-03-13 NOTE — Progress Notes (Signed)
Revlimid, 5mg  14 tabs, has been approved until 09/12/11.

## 2011-03-14 ENCOUNTER — Encounter: Payer: Self-pay | Admitting: *Deleted

## 2011-03-14 ENCOUNTER — Other Ambulatory Visit: Payer: Medicare PPO | Admitting: Lab

## 2011-03-14 ENCOUNTER — Ambulatory Visit: Payer: Medicare PPO

## 2011-03-14 NOTE — Progress Notes (Signed)
RECEIVED FAXES FROM [facility] CONCERNING BENEFITS AND SHIPMENT OF REVLIMID.

## 2011-03-18 ENCOUNTER — Ambulatory Visit (HOSPITAL_BASED_OUTPATIENT_CLINIC_OR_DEPARTMENT_OTHER): Payer: Medicare PPO

## 2011-03-18 ENCOUNTER — Other Ambulatory Visit (HOSPITAL_BASED_OUTPATIENT_CLINIC_OR_DEPARTMENT_OTHER): Payer: Medicare PPO | Admitting: Lab

## 2011-03-18 DIAGNOSIS — C9000 Multiple myeloma not having achieved remission: Secondary | ICD-10-CM

## 2011-03-18 DIAGNOSIS — D472 Monoclonal gammopathy: Secondary | ICD-10-CM

## 2011-03-18 DIAGNOSIS — D649 Anemia, unspecified: Secondary | ICD-10-CM

## 2011-03-18 DIAGNOSIS — Z5112 Encounter for antineoplastic immunotherapy: Secondary | ICD-10-CM

## 2011-03-18 LAB — CBC WITH DIFFERENTIAL/PLATELET
BASO%: 0.2 % (ref 0.0–2.0)
EOS%: 0.4 % (ref 0.0–7.0)
HCT: 24.7 % — ABNORMAL LOW (ref 38.4–49.9)
LYMPH%: 15.7 % (ref 14.0–49.0)
MCH: 30.7 pg (ref 27.2–33.4)
MCHC: 32.7 g/dL (ref 32.0–36.0)
MONO#: 0.5 10*3/uL (ref 0.1–0.9)
NEUT%: 77.7 % — ABNORMAL HIGH (ref 39.0–75.0)
Platelets: 273 10*3/uL (ref 140–400)
RBC: 2.63 10*6/uL — ABNORMAL LOW (ref 4.20–5.82)
WBC: 9.1 10*3/uL (ref 4.0–10.3)
lymph#: 1.4 10*3/uL (ref 0.9–3.3)

## 2011-03-18 LAB — HOLD TUBE, BLOOD BANK

## 2011-03-18 MED ORDER — BORTEZOMIB CHEMO SQ INJECTION 3.5 MG (2.5MG/ML)
1.3000 mg/m2 | Freq: Once | INTRAMUSCULAR | Status: AC
  Administered 2011-03-18: 2.75 mg via SUBCUTANEOUS
  Filled 2011-03-18: qty 2.75

## 2011-03-18 MED ORDER — ONDANSETRON HCL 8 MG PO TABS
8.0000 mg | ORAL_TABLET | Freq: Once | ORAL | Status: AC
  Administered 2011-03-18: 8 mg via ORAL

## 2011-03-18 NOTE — Patient Instructions (Signed)
[facility] Discharge Instructions for Patients Receiving Chemotherapy  Today you received the following chemotherapy agents  Velcade.  To help prevent nausea and vomiting after your treatment, we encourage you to take your nausea medication as prescribed by your physician. If you develop nausea and vomiting that is not controlled by your nausea medication, call the clinic. If it is after clinic hours your family physician or the after hours number for the clinic or go to the Emergency Department.   BELOW ARE SYMPTOMS THAT SHOULD BE REPORTED IMMEDIATELY:  *FEVER GREATER THAN 100.5 F  *CHILLS WITH OR WITHOUT FEVER  NAUSEA AND VOMITING THAT IS NOT CONTROLLED WITH YOUR NAUSEA MEDICATION  *UNUSUAL SHORTNESS OF BREATH  *UNUSUAL BRUISING OR BLEEDING  TENDERNESS IN MOUTH AND THROAT WITH OR WITHOUT PRESENCE OF ULCERS  *URINARY PROBLEMS  *BOWEL PROBLEMS  UNUSUAL RASH Items with * indicate a potential emergency and should be followed up as soon as possible.  Feel free to call the clinic you have any questions or concerns. The clinic phone number is [phone].  Also call for shortness of breath, dizziness, worsening weakness.    I have been informed and understand all the instructions given to me. I know to contact the clinic, my physician, or go to the Emergency Department if any problems should occur. I do not have any questions at this time, but understand that I may call the clinic during office hours   should I have any questions or need assistance in obtaining follow up care.    __________________________________________  _____________  __________ Signature of Patient or Authorized Representative            Date                   Time    __________________________________________ Nurse's Signature

## 2011-03-18 NOTE — Progress Notes (Signed)
Spoke to Lonna Cobb, NP reguarding pt HGB, pt denies symptoms. Ok to treat at this time, to give pt instructions reguarding worsening symptoms of low hgb.

## 2011-03-21 ENCOUNTER — Ambulatory Visit: Payer: Medicare PPO

## 2011-03-21 ENCOUNTER — Other Ambulatory Visit: Payer: Medicare PPO | Admitting: Lab

## 2011-03-25 ENCOUNTER — Other Ambulatory Visit (HOSPITAL_BASED_OUTPATIENT_CLINIC_OR_DEPARTMENT_OTHER): Payer: Medicare PPO | Admitting: Lab

## 2011-03-25 ENCOUNTER — Ambulatory Visit (HOSPITAL_BASED_OUTPATIENT_CLINIC_OR_DEPARTMENT_OTHER): Payer: Medicare PPO

## 2011-03-25 VITALS — BP 162/86 | HR 61 | Temp 98.6°F

## 2011-03-25 DIAGNOSIS — C9000 Multiple myeloma not having achieved remission: Secondary | ICD-10-CM

## 2011-03-25 DIAGNOSIS — Z5112 Encounter for antineoplastic immunotherapy: Secondary | ICD-10-CM

## 2011-03-25 DIAGNOSIS — D649 Anemia, unspecified: Secondary | ICD-10-CM

## 2011-03-25 DIAGNOSIS — D472 Monoclonal gammopathy: Secondary | ICD-10-CM

## 2011-03-25 LAB — CBC WITH DIFFERENTIAL/PLATELET
BASO%: 0.2 % (ref 0.0–2.0)
EOS%: 1.6 % (ref 0.0–7.0)
MCH: 29.4 pg (ref 27.2–33.4)
MCHC: 33.6 g/dL (ref 32.0–36.0)
MCV: 87.6 fL (ref 79.3–98.0)
MONO%: 6.5 % (ref 0.0–14.0)
RBC: 2.99 10*6/uL — ABNORMAL LOW (ref 4.20–5.82)
RDW: 16.3 % — ABNORMAL HIGH (ref 11.0–14.6)
lymph#: 1.8 10*3/uL (ref 0.9–3.3)
nRBC: 0 % (ref 0–0)

## 2011-03-25 MED ORDER — BORTEZOMIB CHEMO SQ INJECTION 3.5 MG (2.5MG/ML)
1.3000 mg/m2 | Freq: Once | INTRAMUSCULAR | Status: AC
  Administered 2011-03-25: 2.75 mg via SUBCUTANEOUS
  Filled 2011-03-25: qty 2.75

## 2011-03-25 MED ORDER — ONDANSETRON HCL 8 MG PO TABS
8.0000 mg | ORAL_TABLET | Freq: Once | ORAL | Status: AC
  Administered 2011-03-25: 8 mg via ORAL

## 2011-03-25 NOTE — Patient Instructions (Signed)
Pt d/c ambulatory. Family member waiting in lobby.Copy of labs and medication list given to pt. Pt to call with concerns

## 2011-04-01 ENCOUNTER — Other Ambulatory Visit: Payer: Self-pay | Admitting: *Deleted

## 2011-04-01 ENCOUNTER — Other Ambulatory Visit: Payer: Medicare PPO | Admitting: Lab

## 2011-04-01 ENCOUNTER — Other Ambulatory Visit: Payer: Self-pay

## 2011-04-01 MED ORDER — LENALIDOMIDE 5 MG PO CAPS: 5.0000 mg | ORAL_CAPSULE | Freq: Every day | ORAL | Status: DC

## 2011-04-01 NOTE — Telephone Encounter (Signed)
THIS REQUEST WAS PLACED IN DR.GRANFORTUNA'S ACTIVE WORK IN BOX. 

## 2011-04-02 NOTE — Telephone Encounter (Signed)
RECEIVED TWO FAXES FROM [facility] CONCERNING A CONFIRMATION OF BENEFITS AND OF PRESCRIPTION SHIPMENT FOR REVLIMID.

## 2011-04-08 ENCOUNTER — Other Ambulatory Visit: Payer: Medicare PPO

## 2011-04-08 ENCOUNTER — Other Ambulatory Visit: Payer: Self-pay | Admitting: Oncology

## 2011-04-08 ENCOUNTER — Ambulatory Visit (HOSPITAL_BASED_OUTPATIENT_CLINIC_OR_DEPARTMENT_OTHER): Payer: Medicare PPO | Admitting: Nurse Practitioner

## 2011-04-08 ENCOUNTER — Ambulatory Visit: Payer: Medicare PPO | Admitting: Lab

## 2011-04-08 ENCOUNTER — Ambulatory Visit (HOSPITAL_BASED_OUTPATIENT_CLINIC_OR_DEPARTMENT_OTHER): Payer: Medicare PPO

## 2011-04-08 VITALS — BP 128/70 | HR 61 | Temp 97.1°F | Ht 71.0 in | Wt 176.1 lb

## 2011-04-08 DIAGNOSIS — I129 Hypertensive chronic kidney disease with stage 1 through stage 4 chronic kidney disease, or unspecified chronic kidney disease: Secondary | ICD-10-CM

## 2011-04-08 DIAGNOSIS — Z5112 Encounter for antineoplastic immunotherapy: Secondary | ICD-10-CM

## 2011-04-08 DIAGNOSIS — D649 Anemia, unspecified: Secondary | ICD-10-CM

## 2011-04-08 DIAGNOSIS — D472 Monoclonal gammopathy: Secondary | ICD-10-CM

## 2011-04-08 DIAGNOSIS — G609 Hereditary and idiopathic neuropathy, unspecified: Secondary | ICD-10-CM

## 2011-04-08 DIAGNOSIS — N189 Chronic kidney disease, unspecified: Secondary | ICD-10-CM

## 2011-04-08 DIAGNOSIS — C9000 Multiple myeloma not having achieved remission: Secondary | ICD-10-CM

## 2011-04-08 LAB — CBC WITH DIFFERENTIAL/PLATELET
BASO%: 1 % (ref 0.0–2.0)
LYMPH%: 16.9 % (ref 14.0–49.0)
MCHC: 33.2 g/dL (ref 32.0–36.0)
MONO#: 1.6 10*3/uL — ABNORMAL HIGH (ref 0.1–0.9)
NEUT#: 7.1 10*3/uL — ABNORMAL HIGH (ref 1.5–6.5)
Platelets: 425 10*3/uL — ABNORMAL HIGH (ref 140–400)
RBC: 2.75 10*6/uL — ABNORMAL LOW (ref 4.20–5.82)
RDW: 17.2 % — ABNORMAL HIGH (ref 11.0–14.6)
WBC: 11.5 10*3/uL — ABNORMAL HIGH (ref 4.0–10.3)
lymph#: 1.9 10*3/uL (ref 0.9–3.3)
nRBC: 0 % (ref 0–0)

## 2011-04-08 MED ORDER — BORTEZOMIB CHEMO SQ INJECTION 3.5 MG (2.5MG/ML)
1.3000 mg/m2 | Freq: Once | INTRAMUSCULAR | Status: AC
  Administered 2011-04-08: 2.75 mg via SUBCUTANEOUS
  Filled 2011-04-08: qty 2.75

## 2011-04-08 MED ORDER — ONDANSETRON HCL 8 MG PO TABS
8.0000 mg | ORAL_TABLET | Freq: Once | ORAL | Status: AC
  Administered 2011-04-08: 8 mg via ORAL

## 2011-04-08 NOTE — Progress Notes (Signed)
OFFICE PROGRESS NOTE  Interval history:  [name] is a 66 year old man with IgG kappa multiple myeloma. He is on active treatment with Revlimid/Velcade/dexamethasone. The Velcade dosing schedule was changed to weekly x3 followed by a one-week break beginning 03/11/2011. The Revlimid dose was decreased to 5 mg daily for 14 days every 21 days beginning 03/14/2011. Dexamethasone dose is 20 mg twice weekly. Most recent serum IgG was further improved at 790 on 02/18/2011.  [name] denies nausea/vomiting. No mouth sores. No diarrhea or constipation. He denies pain. He reports stable numbness in the fingertips and toes. The numbness does not interfere with activity.   Objective: Blood pressure 128/70, pulse 61, temperature 97.1 F (36.2 C), temperature source Oral, height 5\' 11"  (1.803 m), weight 176 lb 1.6 oz (79.878 kg).  Oropharynx is without thrush or ulceration. Lungs are clear. No wheezes or rales. Regular cardiac rhythm. Abdomen is soft and nontender. No organomegaly. Extremities are without edema. Calves are soft and nontender. Motor strength 5 over 5. Vibratory sense is mildly decreased over the fingertips on the right hand and moderately decreased over the fingertips on the left hand per tuning fork exam.  Lab Results: Lab Results  Component Value Date   WBC 11.5* 04/08/2011   HGB 8.4* 04/08/2011   HCT 25.3* 04/08/2011   MCV 92.0 04/08/2011   PLT 425* 04/08/2011    Chemistry:    Chemistry      Component Value Date/Time   NA 129* 03/03/2011 1035   K 4.2 03/03/2011 1035   CL 100 03/03/2011 1035   CO2 18* 03/03/2011 1035   BUN 41* 03/03/2011 1035   CREATININE 2.08* 03/03/2011 1035   CREATININE 2.03* 12/14/2010 1114      Component Value Date/Time   CALCIUM 8.8 03/03/2011 1035   ALKPHOS 91 02/18/2011 0829   AST 15 02/18/2011 0829   ALT 11 02/18/2011 0829   BILITOT 0.6 02/18/2011 0829       Studies/Results: No results found.  Medications: I have reviewed the patient's current  medications.  Assessment/Plan:  1. IgG kappa multiple myeloma-initially referred for further evaluation of anemia and an elevated monoclonal IgG immunoglobulin. Hemoglobin on referral 8.6; IgG 3360 mg with suppression of IgA 37 and IgM 9; IFE with IgG kappa light chain restriction; serum free light chains with kappa 22.1 mg, lambda 1.3 and ratio 16.9. Additional studies done through our office showed renal dysfunction with BUN 27 and creatinine 2.3, calcium normal at 8.7 with albumin 3.7, repeat hemoglobin 9.4 with white count 7000, platelets 305,000, normal white count differential (61 neutrophils, 30 lymphocytes, 5 monocytes). Skeletal bone survey 11/26/10 with mild osteopenia throughout the spine, spondylosis throughout the spine, no suspicious focal lytic lesions. Bone marrow aspiration and biopsy 12/04/10 showed 17% plasma cells positive for CD138, kappa and lambda stains showed kappa light chain restriction. Many of the plasma cells had atypical features. Large clusters or sheets of plasma cells were not identified. He completed the first cycle of Velcade 12/10, 12/13, 12/17 and 12/27/2010. He completed the first cycle of Revlimid beginning 12/19/2010. He completed 4 cycles of Velcade/Decadron through 03/11/2011. Revlimid was placed on hold in January of this year due to a rise in the serum creatinine. The Revlimid dose was adjusted to 5 mg daily for 14 days every 21 days. He began the new Revlimid dose on 03/14/2011. The Velcade dosing schedule was adjusted to weekly x3 followed by a one-week break beginning 03/11/2011 due to neuropathy. The IgG level was improved on 02/18/2011.  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 The Velcade schedule was adjusted to weekly x3 followed by a one-week break beginning with cycle 5 on 03/11/2011. Symptoms are stable. 3. Chronic renal insufficiency likely due to long-standing hypertension and multiple myeloma.  4. Hypertension.  5. Chronic back pain  secondary to scoliosis and spondylosis.  Disposition-[name] appears stable. Plan to continue with Revlimid/dexamethasone/Velcade at the current doses and schedules. Dr. Cyndie Chime discussed a referral to [facility] for a consultation regarding high-dose chemotherapy with autologous stem cell support with [name] and his son. They are in agreement. [name] will return for a followup visit with Dr. Cyndie Chime on 05/06/2011. He will contact the office the interim with any problems.  Plan reviewed with Dr. Cyndie Chime.     Lonna Cobb ANP/GNP-BC

## 2011-04-10 LAB — COMPREHENSIVE METABOLIC PANEL
ALT: 24 U/L (ref 0–53)
AST: 19 U/L (ref 0–37)
Calcium: 9.2 mg/dL (ref 8.4–10.5)
Chloride: 103 mEq/L (ref 96–112)
Creatinine, Ser: 1.74 mg/dL — ABNORMAL HIGH (ref 0.50–1.35)
Sodium: 133 mEq/L — ABNORMAL LOW (ref 135–145)
Total Protein: 5.7 g/dL — ABNORMAL LOW (ref 6.0–8.3)

## 2011-04-10 LAB — KAPPA/LAMBDA LIGHT CHAINS
Kappa free light chain: 1.8 mg/dL (ref 0.33–1.94)
Lambda Free Lght Chn: 2.17 mg/dL (ref 0.57–2.63)

## 2011-04-10 LAB — LACTATE DEHYDROGENASE: LDH: 189 U/L (ref 94–250)

## 2011-04-10 LAB — IMMUNOFIXATION ELECTROPHORESIS

## 2011-04-12 ENCOUNTER — Telehealth: Payer: Self-pay | Admitting: Oncology

## 2011-04-12 NOTE — Telephone Encounter (Signed)
Gv copy of referral to kim in medical recs to schedule appt

## 2011-04-15 ENCOUNTER — Ambulatory Visit (HOSPITAL_BASED_OUTPATIENT_CLINIC_OR_DEPARTMENT_OTHER): Payer: Medicare PPO

## 2011-04-15 ENCOUNTER — Other Ambulatory Visit: Payer: Medicare PPO

## 2011-04-15 ENCOUNTER — Other Ambulatory Visit: Payer: Self-pay | Admitting: Oncology

## 2011-04-15 VITALS — BP 169/69 | HR 69 | Temp 97.6°F

## 2011-04-15 DIAGNOSIS — D472 Monoclonal gammopathy: Secondary | ICD-10-CM

## 2011-04-15 DIAGNOSIS — C9000 Multiple myeloma not having achieved remission: Secondary | ICD-10-CM

## 2011-04-15 DIAGNOSIS — D649 Anemia, unspecified: Secondary | ICD-10-CM

## 2011-04-15 DIAGNOSIS — Z5112 Encounter for antineoplastic immunotherapy: Secondary | ICD-10-CM

## 2011-04-15 LAB — CBC WITH DIFFERENTIAL/PLATELET
Basophils Absolute: 0.1 10*3/uL (ref 0.0–0.1)
Eosinophils Absolute: 0.6 10*3/uL — ABNORMAL HIGH (ref 0.0–0.5)
HCT: 25.2 % — ABNORMAL LOW (ref 38.4–49.9)
HGB: 8.2 g/dL — ABNORMAL LOW (ref 13.0–17.1)
LYMPH%: 19.7 % (ref 14.0–49.0)
MCV: 93 fL (ref 79.3–98.0)
MONO%: 8.1 % (ref 0.0–14.0)
NEUT#: 6.6 10*3/uL — ABNORMAL HIGH (ref 1.5–6.5)
NEUT%: 65.6 % (ref 39.0–75.0)
Platelets: 286 10*3/uL (ref 140–400)
RDW: 17.2 % — ABNORMAL HIGH (ref 11.0–14.6)

## 2011-04-15 MED ORDER — BORTEZOMIB CHEMO SQ INJECTION 3.5 MG (2.5MG/ML)
1.3000 mg/m2 | Freq: Once | INTRAMUSCULAR | Status: AC
  Administered 2011-04-15: 2.75 mg via SUBCUTANEOUS
  Filled 2011-04-15: qty 2.75

## 2011-04-15 MED ORDER — ONDANSETRON HCL 8 MG PO TABS
8.0000 mg | ORAL_TABLET | Freq: Once | ORAL | Status: AC
  Administered 2011-04-15: 8 mg via ORAL

## 2011-04-15 NOTE — Patient Instructions (Signed)
Pt tolerated SQ chemo without problems.   Pt aware of next appts  Pt was stable at discharge via ambulation by self.

## 2011-04-18 ENCOUNTER — Other Ambulatory Visit: Payer: Self-pay | Admitting: *Deleted

## 2011-04-18 MED ORDER — LENALIDOMIDE 5 MG PO CAPS: 5.0000 mg | ORAL_CAPSULE | Freq: Every day | ORAL | Status: DC

## 2011-04-18 NOTE — Telephone Encounter (Signed)
THIS REQUEST WAS PLACED IN DR.GRANFORTUNA'S ACTIVE WORK IN BOX. 

## 2011-04-18 NOTE — Telephone Encounter (Signed)
Received fax request for refill on revlimid.  This was faxed to [facility] pharmacy for refill.

## 2011-04-19 ENCOUNTER — Telehealth: Payer: Self-pay | Admitting: *Deleted

## 2011-04-19 NOTE — Telephone Encounter (Signed)
Called pt & verified that he did know that revlimid causes birth defects & that is the reason for all the precautions.  He states that he probably just didn't understand the questions on the survey.

## 2011-04-19 NOTE — Telephone Encounter (Signed)
Received call from [facility] stating that pt did his survey yest & answered that he didn't know that this drug caused birth defects.  Assured her that he had been educated

## 2011-04-22 ENCOUNTER — Other Ambulatory Visit: Payer: Medicare PPO

## 2011-04-22 ENCOUNTER — Ambulatory Visit (HOSPITAL_BASED_OUTPATIENT_CLINIC_OR_DEPARTMENT_OTHER): Payer: Medicare PPO

## 2011-04-22 ENCOUNTER — Telehealth: Payer: Self-pay | Admitting: Oncology

## 2011-04-22 VITALS — BP 92/53 | HR 68 | Temp 97.3°F

## 2011-04-22 DIAGNOSIS — C9000 Multiple myeloma not having achieved remission: Secondary | ICD-10-CM

## 2011-04-22 DIAGNOSIS — D649 Anemia, unspecified: Secondary | ICD-10-CM

## 2011-04-22 DIAGNOSIS — D472 Monoclonal gammopathy: Secondary | ICD-10-CM

## 2011-04-22 DIAGNOSIS — Z5112 Encounter for antineoplastic immunotherapy: Secondary | ICD-10-CM

## 2011-04-22 LAB — CBC WITH DIFFERENTIAL/PLATELET
Basophils Absolute: 0.1 10*3/uL (ref 0.0–0.1)
Eosinophils Absolute: 0.4 10*3/uL (ref 0.0–0.5)
HCT: 25.4 % — ABNORMAL LOW (ref 38.4–49.9)
HGB: 8.6 g/dL — ABNORMAL LOW (ref 13.0–17.1)
LYMPH%: 12.1 % — ABNORMAL LOW (ref 14.0–49.0)
MONO#: 0.7 10*3/uL (ref 0.1–0.9)
NEUT%: 79.7 % — ABNORMAL HIGH (ref 39.0–75.0)
Platelets: 306 10*3/uL (ref 140–400)
WBC: 13.4 10*3/uL — ABNORMAL HIGH (ref 4.0–10.3)
lymph#: 1.6 10*3/uL (ref 0.9–3.3)

## 2011-04-22 MED ORDER — ONDANSETRON HCL 8 MG PO TABS
8.0000 mg | ORAL_TABLET | Freq: Once | ORAL | Status: AC
  Administered 2011-04-22: 8 mg via ORAL

## 2011-04-22 MED ORDER — BORTEZOMIB CHEMO SQ INJECTION 3.5 MG (2.5MG/ML)
1.3000 mg/m2 | Freq: Once | INTRAMUSCULAR | Status: AC
  Administered 2011-04-22: 2.75 mg via SUBCUTANEOUS
  Filled 2011-04-22: qty 2.75

## 2011-04-22 NOTE — Telephone Encounter (Signed)
Pt came by today and wants appt change due to an appt in [facility], a referral made by Dr.G. Appt is 05/06/11 1pm. Pt's son is asking can  velcade be done on a different day so pt will not be as tired doing all these activities in one day. Called Dr. Patsy Lager nurse left message.

## 2011-04-22 NOTE — Telephone Encounter (Signed)
RECEIVED A FAX FROM [facility] CONCERNING A CONFIRMATION OF PRESCRIPTION SHIPMENT FOR REVLIMID. 

## 2011-04-25 ENCOUNTER — Other Ambulatory Visit: Payer: Self-pay | Admitting: Oncology

## 2011-04-25 ENCOUNTER — Telehealth: Payer: Self-pay | Admitting: Oncology

## 2011-04-25 ENCOUNTER — Encounter: Payer: Self-pay | Admitting: *Deleted

## 2011-04-25 NOTE — Telephone Encounter (Signed)
S/w the pt and he is aware of his r/s appts from April to may 2013 due to he had a conflict in his April appts.

## 2011-04-29 ENCOUNTER — Telehealth: Payer: Self-pay | Admitting: *Deleted

## 2011-04-29 NOTE — Telephone Encounter (Signed)
Vm left by pt's wife asking for return call b/c she had questions about her husband's health. Returned Call @ 6:46pm & she wanted to know if his chemo would effect his appetite.  She was informed that it could & encouraged high calorie, high protein foods & these were discussed.  Also encouraged good fluid intake.  She wanted to know what the name of his cancer was b/c the pt couldn't remember.  Informed that it was Multiple Myeloma & reminded her that pt could p/u some information next time he is here if he would like some.

## 2011-05-02 ENCOUNTER — Telehealth: Payer: Self-pay | Admitting: *Deleted

## 2011-05-02 NOTE — Telephone Encounter (Signed)
Dr. Bascom Levels called for an update for understanding of what is going on at this time with Calvin Mccormick.  Patient has informed him his medications are being increased to a higher dose.  Patient is going to [facility] on 05-06-11 for high dose chemotherapy which our office won't know what meds until after this visit.  No changes in our prescribed meds have been made per mid-level.  Instructed Dr. Bascom Levels he may try later to speak with Dr. Cyndie Chime upon his return or after this visit at [facility].

## 2011-05-06 ENCOUNTER — Other Ambulatory Visit: Payer: Medicare PPO | Admitting: Lab

## 2011-05-06 ENCOUNTER — Ambulatory Visit: Payer: Medicare PPO | Admitting: Oncology

## 2011-05-06 ENCOUNTER — Ambulatory Visit: Payer: Medicare PPO

## 2011-05-09 ENCOUNTER — Other Ambulatory Visit: Payer: Self-pay | Admitting: *Deleted

## 2011-05-09 MED ORDER — LENALIDOMIDE 10 MG PO CAPS: 10.0000 mg | ORAL_CAPSULE | Freq: Every day | ORAL | Status: DC

## 2011-05-13 ENCOUNTER — Telehealth: Payer: Self-pay | Admitting: Oncology

## 2011-05-13 ENCOUNTER — Other Ambulatory Visit (HOSPITAL_BASED_OUTPATIENT_CLINIC_OR_DEPARTMENT_OTHER): Payer: Medicare PPO | Admitting: Lab

## 2011-05-13 ENCOUNTER — Ambulatory Visit: Payer: Medicare PPO | Admitting: Oncology

## 2011-05-13 ENCOUNTER — Ambulatory Visit (HOSPITAL_BASED_OUTPATIENT_CLINIC_OR_DEPARTMENT_OTHER): Payer: Medicare PPO | Admitting: Nurse Practitioner

## 2011-05-13 ENCOUNTER — Ambulatory Visit (HOSPITAL_BASED_OUTPATIENT_CLINIC_OR_DEPARTMENT_OTHER): Payer: Medicare PPO

## 2011-05-13 VITALS — BP 131/63 | HR 59 | Temp 97.0°F | Ht 71.0 in | Wt 185.3 lb

## 2011-05-13 DIAGNOSIS — Z5112 Encounter for antineoplastic immunotherapy: Secondary | ICD-10-CM

## 2011-05-13 DIAGNOSIS — D472 Monoclonal gammopathy: Secondary | ICD-10-CM

## 2011-05-13 DIAGNOSIS — C9000 Multiple myeloma not having achieved remission: Secondary | ICD-10-CM

## 2011-05-13 DIAGNOSIS — D649 Anemia, unspecified: Secondary | ICD-10-CM

## 2011-05-13 DIAGNOSIS — N289 Disorder of kidney and ureter, unspecified: Secondary | ICD-10-CM

## 2011-05-13 LAB — CBC WITH DIFFERENTIAL/PLATELET
BASO%: 0.6 % (ref 0.0–2.0)
Basophils Absolute: 0.1 10*3/uL (ref 0.0–0.1)
EOS%: 4.5 % (ref 0.0–7.0)
MCH: 30.8 pg (ref 27.2–33.4)
MCHC: 33.2 g/dL (ref 32.0–36.0)
MCV: 92.7 fL (ref 79.3–98.0)
MONO%: 8.6 % (ref 0.0–14.0)
RBC: 2.34 10*6/uL — ABNORMAL LOW (ref 4.20–5.82)
RDW: 17.3 % — ABNORMAL HIGH (ref 11.0–14.6)

## 2011-05-13 MED ORDER — ONDANSETRON HCL 8 MG PO TABS
8.0000 mg | ORAL_TABLET | Freq: Once | ORAL | Status: AC
Start: 2011-05-13 — End: 2011-05-13
  Administered 2011-05-13: 8 mg via ORAL

## 2011-05-13 MED ORDER — BORTEZOMIB CHEMO SQ INJECTION 3.5 MG (2.5MG/ML)
1.3000 mg/m2 | Freq: Once | INTRAMUSCULAR | Status: AC
  Administered 2011-05-13: 2.75 mg via SUBCUTANEOUS
  Filled 2011-05-13: qty 2.75

## 2011-05-13 NOTE — Progress Notes (Signed)
OFFICE PROGRESS NOTE  Interval history:  Calvin Mccormick is a 66 year old man with IgG kappa multiple myeloma. He is on active treatment with Revlimid/Velcade/dexamethasone. The Velcade is given weekly x3 followed by a one-week break. The Revlimid dose is 5 mg daily for 14 days every 21 days. He takes dexamethasone 20 mg twice weekly.  Calvin Mccormick was referred to [facility] following his last visit on 04/08/2011 for consultation regarding high-dose chemotherapy with autologous stem cell support. Calvin Mccormick reports he was seen at [facility] and plans to contact them in the near future to let him know he has decided to proceed. He would like to wait until after 06/15/2011 so he can attend a grandchild's graduation.  He reports he is feeling well. He has a good appetite. He is gaining weight. He denies nausea/vomiting. No mouth sores. No diarrhea or constipation. He denies shortness of breath. No chest pain. No cough. He has stable numbness in the fingertips and toes. The numbness does not interfere with activity.   Objective: Blood pressure 131/63, pulse 59, temperature 97 F (36.1 C), temperature source Oral, height 5\' 11"  (1.803 m), weight 185 lb 4.8 oz (84.052 kg).  Oropharynx is without thrush or ulceration. Lungs are clear. No wheezes or rales. Regular cardiac rhythm. Abdomen is soft and nontender. No organomegaly. 1+ pitting edema at the lower legs bilaterally. Motor strength 5 over 5. Vibratory sense is mildly decreased over the fingertips on the right hand and moderately decreased over the fingertips on the left hand per tuning fork exam.  Lab Results: Lab Results  Component Value Date   WBC 10.3 05/13/2011   HGB 7.2* 05/13/2011   HCT 21.7* 05/13/2011   MCV 92.7 05/13/2011   PLT 347 05/13/2011    Chemistry:    Chemistry      Component Value Date/Time   NA 133* 04/08/2011 1131   K 4.5 04/08/2011 1131   CL 103 04/08/2011 1131   CO2 19 04/08/2011 1131   BUN 34* 04/08/2011 1131   CREATININE 1.74*  04/08/2011 1131   CREATININE 2.03* 12/14/2010 1114      Component Value Date/Time   CALCIUM 9.2 04/08/2011 1131   ALKPHOS 72 04/08/2011 1131   AST 19 04/08/2011 1131   ALT 24 04/08/2011 1131   BILITOT 0.5 04/08/2011 1131       Studies/Results: No results found.  Medications: I have reviewed the patient's current medications.  Assessment/Plan:  1. IgG kappa multiple myeloma-initially referred for further evaluation of anemia and an elevated monoclonal IgG immunoglobulin. Hemoglobin on referral 8.6; IgG 3360 mg with suppression of IgA 37 and IgM 9; IFE with IgG kappa light chain restriction; serum free light chains with kappa 22.1 mg, lambda 1.3 and ratio 16.9. Additional studies done through our office showed renal dysfunction with BUN 27 and creatinine 2.3, calcium normal at 8.7 with albumin 3.7, repeat hemoglobin 9.4 with white count 7000, platelets 305,000, normal white count differential (61 neutrophils, 30 lymphocytes, 5 monocytes). Skeletal bone survey 11/26/10 with mild osteopenia throughout the spine, spondylosis throughout the spine, no suspicious focal lytic lesions. Bone marrow aspiration and biopsy 12/04/10 showed 17% plasma cells positive for CD138, kappa and lambda stains showed kappa light chain restriction. Many of the plasma cells had atypical features. Large clusters or sheets of plasma cells were not identified. He completed the first cycle of Velcade 12/10, 12/13, 12/17 and 12/27/2010. He completed the first cycle of Revlimid beginning 12/19/2010. He completed 4 cycles of Velcade/Decadron through 03/11/2011. Revlimid  was placed on hold in January of this year due to a rise in the serum creatinine. The Revlimid dose was adjusted to 5 mg daily for 14 days every 21 days. He began the new Revlimid dose on 03/14/2011. The Velcade dosing schedule was adjusted to weekly x3 followed by a one-week break beginning 03/11/2011 due to neuropathy. The IgG level was improved on 02/18/2011. The IgG level  was further improved on 04/08/2011. 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 The Velcade schedule was adjusted to weekly x3 followed by a one-week break beginning with cycle 5 on 03/11/2011. Symptoms are stable.  3. Chronic renal insufficiency likely due to long-standing hypertension and multiple myeloma.  4. Hypertension.  5. Chronic back pain secondary to scoliosis and spondylosis. 6. 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 He is asymptomatic and declines red cell transfusion support at present. We will repeat a CBC in one week. Signs and symptoms suggestive of progressive anemia were reviewed with Calvin Mccormick and his son at today's visit.  Disposition-Calvin Mccormick appears stable. Plan to continue RVD as above. He will contact [facility] to let them know he has decided to proceed with high-dose chemotherapy/autologous stem cell support. He will return for a followup visit with Dr. Cyndie Chime on 06/17/2011. He will contact the office in the interim as outlined above or with any other problems.  Plan reviewed with Dr. Cyndie Chime.   Lonna Cobb ANP/GNP-BC

## 2011-05-13 NOTE — Telephone Encounter (Signed)
Gave pt appt for May and June 2013 lab , MD and chemo

## 2011-05-13 NOTE — Patient Instructions (Signed)
[facility] Discharge Instructions for Patients Receiving Chemotherapy  Today you received the following chemotherapy agents Velcade  To help prevent nausea and vomiting after your treatment, we encourage you to take your nausea medication as prescribed, If you develop nausea and vomiting that is not controlled by your nausea medication, call the clinic. If it is after clinic hours your family physician or the after hours number for the clinic or go to the Emergency Department.   BELOW ARE SYMPTOMS THAT SHOULD BE REPORTED IMMEDIATELY:  *FEVER GREATER THAN 100.5 F  *CHILLS WITH OR WITHOUT FEVER  NAUSEA AND VOMITING THAT IS NOT CONTROLLED WITH YOUR NAUSEA MEDICATION  *UNUSUAL SHORTNESS OF BREATH  *UNUSUAL BRUISING OR BLEEDING  TENDERNESS IN MOUTH AND THROAT WITH OR WITHOUT PRESENCE OF ULCERS  *URINARY PROBLEMS  *BOWEL PROBLEMS  UNUSUAL RASH Items with * indicate a potential emergency and should be followed up as soon as possible.  One of the nurses will contact you 24 hours after your treatment. Please let the nurse know about any problems that you may have experienced. Feel free to call the clinic you have any questions or concerns. The clinic phone number is [phone].   I have been informed and understand all the instructions given to me. I know to contact the clinic, my physician, or go to the Emergency Department if any problems should occur. I do not have any questions at this time, but understand that I may call the clinic during office hours   should I have any questions or need assistance in obtaining follow up care.    __________________________________________  _____________  __________ Signature of Patient or Authorized Representative            Date                   Time    __________________________________________ Nurse's Signature

## 2011-05-15 LAB — IMMUNOFIXATION ELECTROPHORESIS: IgG (Immunoglobin G), Serum: 605 mg/dL — ABNORMAL LOW (ref 650–1600)

## 2011-05-15 LAB — KAPPA/LAMBDA LIGHT CHAINS: Kappa free light chain: 0.16 mg/dL — ABNORMAL LOW (ref 0.33–1.94)

## 2011-05-16 ENCOUNTER — Telehealth: Payer: Self-pay | Admitting: *Deleted

## 2011-05-16 NOTE — Telephone Encounter (Signed)
Fax received from [facility] that they have tried to call patient to schedule delivery of Revlimid without success.  This nurse called [phone], spoke with son who says he is there and to have [facility] call.  Called [facility], confirmed home number and informed them the number was not busy.  Also shared information that Calvin Mccormick will be in later this evening.  [facility] says they will try and do not believe son is authorized as a contact person but will have nurse continue to try to call.Marland Kitchen

## 2011-05-20 ENCOUNTER — Ambulatory Visit (HOSPITAL_BASED_OUTPATIENT_CLINIC_OR_DEPARTMENT_OTHER): Payer: Medicare PPO

## 2011-05-20 ENCOUNTER — Other Ambulatory Visit (HOSPITAL_BASED_OUTPATIENT_CLINIC_OR_DEPARTMENT_OTHER): Payer: Medicare PPO | Admitting: Lab

## 2011-05-20 VITALS — BP 128/70 | HR 64 | Temp 97.2°F

## 2011-05-20 DIAGNOSIS — D472 Monoclonal gammopathy: Secondary | ICD-10-CM

## 2011-05-20 DIAGNOSIS — Z5112 Encounter for antineoplastic immunotherapy: Secondary | ICD-10-CM

## 2011-05-20 DIAGNOSIS — C9000 Multiple myeloma not having achieved remission: Secondary | ICD-10-CM

## 2011-05-20 DIAGNOSIS — D649 Anemia, unspecified: Secondary | ICD-10-CM

## 2011-05-20 LAB — CBC WITH DIFFERENTIAL/PLATELET
BASO%: 0.9 % (ref 0.0–2.0)
Basophils Absolute: 0.1 10*3/uL (ref 0.0–0.1)
Eosinophils Absolute: 0.5 10*3/uL (ref 0.0–0.5)
HCT: 24.3 % — ABNORMAL LOW (ref 38.4–49.9)
HGB: 8.1 g/dL — ABNORMAL LOW (ref 13.0–17.1)
MONO#: 1.1 10*3/uL — ABNORMAL HIGH (ref 0.1–0.9)
NEUT#: 5.6 10*3/uL (ref 1.5–6.5)
NEUT%: 61.9 % (ref 39.0–75.0)
WBC: 9 10*3/uL (ref 4.0–10.3)
lymph#: 1.8 10*3/uL (ref 0.9–3.3)

## 2011-05-20 MED ORDER — ONDANSETRON HCL 8 MG PO TABS
8.0000 mg | ORAL_TABLET | Freq: Once | ORAL | Status: AC
  Administered 2011-05-20: 8 mg via ORAL

## 2011-05-20 MED ORDER — BORTEZOMIB CHEMO SQ INJECTION 3.5 MG (2.5MG/ML)
1.3000 mg/m2 | Freq: Once | INTRAMUSCULAR | Status: AC
  Administered 2011-05-20: 2.75 mg via SUBCUTANEOUS
  Filled 2011-05-20: qty 2.75

## 2011-05-27 ENCOUNTER — Ambulatory Visit (HOSPITAL_BASED_OUTPATIENT_CLINIC_OR_DEPARTMENT_OTHER): Payer: Medicare PPO

## 2011-05-27 ENCOUNTER — Other Ambulatory Visit (HOSPITAL_BASED_OUTPATIENT_CLINIC_OR_DEPARTMENT_OTHER): Payer: Medicare PPO | Admitting: Lab

## 2011-05-27 VITALS — BP 136/68 | HR 64 | Temp 97.1°F

## 2011-05-27 DIAGNOSIS — Z5112 Encounter for antineoplastic immunotherapy: Secondary | ICD-10-CM

## 2011-05-27 DIAGNOSIS — D472 Monoclonal gammopathy: Secondary | ICD-10-CM

## 2011-05-27 DIAGNOSIS — C9000 Multiple myeloma not having achieved remission: Secondary | ICD-10-CM

## 2011-05-27 DIAGNOSIS — D649 Anemia, unspecified: Secondary | ICD-10-CM

## 2011-05-27 LAB — CBC WITH DIFFERENTIAL/PLATELET
Eosinophils Absolute: 0.5 10*3/uL (ref 0.0–0.5)
HCT: 24 % — ABNORMAL LOW (ref 38.4–49.9)
LYMPH%: 13.6 % — ABNORMAL LOW (ref 14.0–49.0)
MONO#: 0.7 10*3/uL (ref 0.1–0.9)
NEUT#: 11.9 10*3/uL — ABNORMAL HIGH (ref 1.5–6.5)
Platelets: 238 10*3/uL (ref 140–400)
RBC: 2.63 10*6/uL — ABNORMAL LOW (ref 4.20–5.82)
WBC: 15.2 10*3/uL — ABNORMAL HIGH (ref 4.0–10.3)
nRBC: 0 % (ref 0–0)

## 2011-05-27 LAB — TECHNOLOGIST REVIEW

## 2011-05-27 MED ORDER — BORTEZOMIB CHEMO SQ INJECTION 3.5 MG (2.5MG/ML)
1.3000 mg/m2 | Freq: Once | INTRAMUSCULAR | Status: AC
  Administered 2011-05-27: 2.75 mg via SUBCUTANEOUS
  Filled 2011-05-27: qty 2.75

## 2011-05-27 MED ORDER — ONDANSETRON HCL 8 MG PO TABS
8.0000 mg | ORAL_TABLET | Freq: Once | ORAL | Status: AC
  Administered 2011-05-27: 8 mg via ORAL

## 2011-05-27 NOTE — Progress Notes (Signed)
Patient had a tick on left upper back. Dr. Cyndie Chime notified. Tick removed, area is slightly reddened and swollen. Patient instructed to follow up with his primary care physician. Dr. Bascom Levels.

## 2011-05-29 ENCOUNTER — Inpatient Hospital Stay (HOSPITAL_COMMUNITY)
Admission: EM | Admit: 2011-05-29 | Discharge: 2011-06-02 | DRG: 683 | Disposition: A | Discharge status: Home / Self Care | Payer: Medicare PPO | Attending: Internal Medicine | Admitting: Internal Medicine

## 2011-05-29 ENCOUNTER — Encounter (HOSPITAL_COMMUNITY): Payer: Self-pay | Admitting: *Deleted

## 2011-05-29 ENCOUNTER — Emergency Department (HOSPITAL_COMMUNITY): Payer: Medicare PPO

## 2011-05-29 ENCOUNTER — Other Ambulatory Visit: Payer: Self-pay

## 2011-05-29 DIAGNOSIS — R112 Nausea with vomiting, unspecified: Secondary | ICD-10-CM | POA: Diagnosis present

## 2011-05-29 DIAGNOSIS — I129 Hypertensive chronic kidney disease with stage 1 through stage 4 chronic kidney disease, or unspecified chronic kidney disease: Secondary | ICD-10-CM | POA: Diagnosis present

## 2011-05-29 DIAGNOSIS — D72829 Elevated white blood cell count, unspecified: Secondary | ICD-10-CM | POA: Diagnosis present

## 2011-05-29 DIAGNOSIS — R339 Retention of urine, unspecified: Secondary | ICD-10-CM | POA: Diagnosis present

## 2011-05-29 DIAGNOSIS — Z79899 Other long term (current) drug therapy: Secondary | ICD-10-CM

## 2011-05-29 DIAGNOSIS — D6481 Anemia due to antineoplastic chemotherapy: Secondary | ICD-10-CM | POA: Diagnosis present

## 2011-05-29 DIAGNOSIS — N289 Disorder of kidney and ureter, unspecified: Secondary | ICD-10-CM

## 2011-05-29 DIAGNOSIS — N183 Chronic kidney disease, stage 3 unspecified: Secondary | ICD-10-CM | POA: Diagnosis present

## 2011-05-29 DIAGNOSIS — N179 Acute kidney failure, unspecified: Principal | ICD-10-CM | POA: Diagnosis present

## 2011-05-29 DIAGNOSIS — E872 Acidosis, unspecified: Secondary | ICD-10-CM | POA: Diagnosis present

## 2011-05-29 DIAGNOSIS — D631 Anemia in chronic kidney disease: Secondary | ICD-10-CM | POA: Diagnosis present

## 2011-05-29 DIAGNOSIS — C9002 Multiple myeloma in relapse: Secondary | ICD-10-CM | POA: Diagnosis present

## 2011-05-29 DIAGNOSIS — E785 Hyperlipidemia, unspecified: Secondary | ICD-10-CM | POA: Diagnosis present

## 2011-05-29 DIAGNOSIS — E876 Hypokalemia: Secondary | ICD-10-CM | POA: Diagnosis present

## 2011-05-29 DIAGNOSIS — T451X5A Adverse effect of antineoplastic and immunosuppressive drugs, initial encounter: Secondary | ICD-10-CM | POA: Diagnosis present

## 2011-05-29 DIAGNOSIS — C9000 Multiple myeloma not having achieved remission: Secondary | ICD-10-CM

## 2011-05-29 DIAGNOSIS — D630 Anemia in neoplastic disease: Secondary | ICD-10-CM | POA: Diagnosis present

## 2011-05-29 DIAGNOSIS — D649 Anemia, unspecified: Secondary | ICD-10-CM

## 2011-05-29 DIAGNOSIS — D472 Monoclonal gammopathy: Secondary | ICD-10-CM

## 2011-05-29 DIAGNOSIS — E860 Dehydration: Secondary | ICD-10-CM

## 2011-05-29 LAB — CBC
Platelets: 322 10*3/uL (ref 150–400)
RBC: 3.15 MIL/uL — ABNORMAL LOW (ref 4.22–5.81)
RDW: 18.6 % — ABNORMAL HIGH (ref 11.5–15.5)
WBC: 32.7 10*3/uL — ABNORMAL HIGH (ref 4.0–10.5)

## 2011-05-29 LAB — BASIC METABOLIC PANEL
CO2: 11 mEq/L — ABNORMAL LOW (ref 19–32)
Calcium: 8.8 mg/dL (ref 8.4–10.5)
Potassium: 2.9 mEq/L — ABNORMAL LOW (ref 3.5–5.1)
Sodium: 142 mEq/L (ref 135–145)

## 2011-05-29 LAB — DIFFERENTIAL
Basophils Absolute: 0 10*3/uL (ref 0.0–0.1)
Eosinophils Relative: 0 % (ref 0–5)
Lymphocytes Relative: 7 % — ABNORMAL LOW (ref 12–46)
Neutro Abs: 29 10*3/uL — ABNORMAL HIGH (ref 1.7–7.7)
Neutrophils Relative %: 89 % — ABNORMAL HIGH (ref 43–77)

## 2011-05-29 LAB — LIPASE, BLOOD: Lipase: 93 U/L — ABNORMAL HIGH (ref 11–59)

## 2011-05-29 MED ORDER — POTASSIUM CHLORIDE 10 MEQ/100ML IV SOLN
10.0000 meq | INTRAVENOUS | Status: AC
  Administered 2011-05-29 – 2011-05-30 (×4): 10 meq via INTRAVENOUS
  Filled 2011-05-29 (×4): qty 100

## 2011-05-29 MED ORDER — ONDANSETRON HCL 4 MG/2ML IJ SOLN
4.0000 mg | Freq: Once | INTRAMUSCULAR | Status: AC
  Administered 2011-05-29: 4 mg via INTRAVENOUS

## 2011-05-29 MED ORDER — ONDANSETRON HCL 4 MG/2ML IJ SOLN
4.0000 mg | Freq: Once | INTRAMUSCULAR | Status: AC
  Administered 2011-05-29: 4 mg via INTRAVENOUS
  Filled 2011-05-29: qty 2

## 2011-05-29 MED ORDER — ONDANSETRON HCL 4 MG/2ML IJ SOLN
INTRAMUSCULAR | Status: AC
  Administered 2011-05-29: 4 mg via INTRAVENOUS
  Filled 2011-05-29: qty 2

## 2011-05-29 MED ORDER — HYDROMORPHONE HCL PF 1 MG/ML IJ SOLN
1.0000 mg | Freq: Once | INTRAMUSCULAR | Status: AC
  Administered 2011-05-29: 1 mg via INTRAVENOUS
  Filled 2011-05-29: qty 1

## 2011-05-29 MED ORDER — SODIUM CHLORIDE 0.9 % IV SOLN
1000.0000 mL | INTRAVENOUS | Status: DC
  Administered 2011-05-29 (×2): 1000 mL via INTRAVENOUS

## 2011-05-29 NOTE — ED Notes (Signed)
Pt given urinal and made aware of need for urine and to notify tech when successful.

## 2011-05-29 NOTE — ED Notes (Signed)
Pt in c/o abd pain and n/v since last night, pt actively vomiting at this time, pt last received chemo shot on Monday, also states they found and removed a tick from his back last Monday, wound is healing well

## 2011-05-29 NOTE — ED Notes (Signed)
Went back in to check on pt and see if he has produced any urine. Pt is unsuccessful at this time. Tried to encourage client to urinate in urinal. Unsuccessful on attempt.

## 2011-05-29 NOTE — ED Provider Notes (Signed)
History     CSN: [id number]  Arrival date & time 05/29/11  1800   First MD Initiated Contact with Patient 05/29/11 2059      Chief Complaint  Patient presents with  . Abdominal Pain  . N/V     (Consider location/radiation/quality/duration/timing/severity/associated sxs/prior treatment) HPI Comments: Calvin Mccormick is a 66 y.o. Male who complains of nausea, vomiting, and abdominal pain since last night. He had chemotherapy 2 days ago for multiple myeloma. He also apparently, received an injection of Neulasta that. It is not clear which part of cycling he is on for his treatment. He denies fever, chills back pain dizziness, chest cough, or shortness of breath. He has been unable to use his regular medications at home.  The history is provided by the patient.    Past Medical History  Diagnosis Date  . Anemia 11/27/2010  . Hypertension, benign essential, goal below 140/90 11/27/2010  . Multiple myeloma without mention of remission 12/12/2010  . Multiple myeloma without mention of remission 12/12/2010  . CKD (chronic kidney disease), stage III 02/05/2011  . Hyperlipemia     History reviewed. No pertinent past surgical history.  History reviewed. No pertinent family history.  History  Substance Use Topics  . Smoking status: Not on file  . Smokeless tobacco: Not on file  . Alcohol Use:       Review of Systems  All other systems reviewed and are negative.    Allergies  Review of patient's allergies indicates no known allergies.  Home Medications   Current Outpatient Rx  Name Route Sig Dispense Refill  . ATORVASTATIN CALCIUM 20 MG PO TABS Oral Take 20 mg by mouth at bedtime.     Marland Kitchen CLONIDINE HCL 0.3 MG PO TABS Oral Take 0.3 mg by mouth 2 (two) times daily.      Marland Kitchen DEXAMETHASONE 4 MG PO TABS Oral Take 20 mg by mouth every 7 (seven) days. On Mondays.    Marland Kitchen ESOMEPRAZOLE MAGNESIUM 40 MG PO CPDR Oral Take 40 mg by mouth daily before breakfast.      . FOLIC ACID 1 MG PO TABS Oral  Take 1 mg by mouth daily.      Marland Kitchen LENALIDOMIDE 10 MG PO CAPS Oral Take 1 capsule (10 mg total) by mouth daily. 1 po daily x 14 days, then 7 day break.   AUTH # [id number] Faxed to [facility] [phone] 14 capsule 0    This was faxed to [facility].  . ONDANSETRON 8 MG PO TBDP Oral Take 8 mg by mouth every 8 (eight) hours as needed. For nausea.    Marland Kitchen POLYVINYL ALCOHOL 1.4 % OP SOLN Both Eyes Place 1 drop into both eyes as needed. For dry eyes.    Marland Kitchen PRESCRIPTION MEDICATION Intravenous Inject into the vein every 7 (seven) days. Velcade injections.  His oncologist is Dr. Okey Dupre. He receives his treatments on Mondays.    Marland Kitchen RAMIPRIL 2.5 MG PO TABS Oral Take 2.5 mg by mouth daily.      Marland Kitchen SILDENAFIL CITRATE 50 MG PO TABS Oral Take 50 mg by mouth daily as needed. For erectile dysfunction.    . SULINDAC 150 MG PO TABS Oral Take 150 mg by mouth 2 (two) times daily.      . TRAMADOL HCL 50 MG PO TABS Oral Take 50 mg by mouth every 6 (six) hours as needed. For pain. Maximum dose= 8 tablets per day.    . TRIAMTERENE-HCTZ 37.5-25 MG PO  TABS Oral Take 1 tablet by mouth daily.      Marland Kitchen VALACYCLOVIR HCL 500 MG PO TABS  TAKE 1 TABLET BY MOUTH EVERY DAY TO PREVENT VIRUS INFECTION 30 tablet 0  . VITAMIN E 400 UNITS PO CAPS Oral Take 400 Units by mouth daily.        BP 187/67  Pulse 84  Temp(Src) 97.9 F (36.6 C) (Oral)  Resp 14  SpO2 100%  Physical Exam  Nursing note and vitals reviewed. Constitutional: He is oriented to person, place, and time. He appears well-developed. He appears distressed.       Ill-appearing, actively vomiting.  HENT:  Head: Normocephalic and atraumatic.  Right Ear: External ear normal.  Left Ear: External ear normal.  Eyes: Conjunctivae and EOM are normal. Pupils are equal, round, and reactive to light.  Neck: Normal range of motion and phonation normal. Neck supple.  Cardiovascular: Normal rate, regular rhythm, normal heart sounds and intact distal pulses.     Pulmonary/Chest: Effort normal and breath sounds normal. He exhibits no bony tenderness.  Abdominal: Soft. Normal appearance. There is tenderness (diffuse, mild).  Musculoskeletal: Normal range of motion. He exhibits no edema.  Neurological: He is alert and oriented to person, place, and time. He has normal strength. No cranial nerve deficit or sensory deficit. He exhibits normal muscle tone. Coordination normal.  Skin: Skin is warm, dry and intact.  Psychiatric: He has a normal mood and affect. His behavior is normal. Judgment and thought content normal.    ED Course  Procedures (including critical care time)    Date: 05/29/2011  Rate: 99  Rhythm: normal sinus rhythm  QRS Axis: left  Intervals: normal  ST/T Wave abnormalities: Nonspecific ST/T cave abnormality  Conduction Disutrbances:none  Narrative Interpretation:   Old EKG Reviewed: none available   Labs Reviewed  CBC - Abnormal; Notable for the following:    WBC 32.7 (*)    RBC 3.15 (*)    Hemoglobin 10.0 (*)    HCT 28.8 (*)    RDW 18.6 (*)    All other components within normal limits  DIFFERENTIAL - Abnormal; Notable for the following:    Neutrophils Relative 89 (*)    Neutro Abs 29.0 (*)    Lymphocytes Relative 7 (*)    Monocytes Absolute 1.3 (*)    All other components within normal limits  BASIC METABOLIC PANEL - Abnormal; Notable for the following:    Potassium 2.9 (*) REPEATED TO VERIFY   CO2 11 (*) REPEATED TO VERIFY   Glucose, Bld 133 (*)    BUN 36 (*)    Creatinine, Ser 4.55 (*)    GFR calc non Af Amer 12 (*)    GFR calc Af Amer 14 (*)    All other components within normal limits  LIPASE, BLOOD - Abnormal; Notable for the following:    Lipase 93 (*)    All other components within normal limits  LACTIC ACID, PLASMA - Abnormal; Notable for the following:    Lactic Acid, Venous 2.3 (*)    All other components within normal limits  URINALYSIS, ROUTINE W REFLEX MICROSCOPIC   Dg Abd Acute  W/chest  05/29/2011  *RADIOLOGY REPORT*  Clinical Data: Abdominal pain  ACUTE ABDOMEN SERIES (ABDOMEN 2 VIEW & CHEST 1 VIEW)  Comparison: None.  Findings: Heart size upper normal limits.  Mild central vascular congestion.  No overt edema or focal consolidation.  No free intraperitoneal air.  Relative paucity of small bowel gas. Leftward  curvature of the thoracolumbar junction with L1-2 fusion. Osteopenia.  IMPRESSION: Nonobstructive bowel gas pattern.  Original Report Authenticated By: Waneta Martins, M.D.   Component     Latest Ref Rng 03/03/2011 04/08/2011 05/29/2011  Sodium     135 - 145 mEq/L 129 (L) 133 (L) 142  Potassium     3.5 - 5.1 mEq/L 4.2 4.5 2.9 (L)  Chloride     96 - 112 mEq/L 100 103 105  CO2     19 - 32 mEq/L 18 (L) 19 11 (L)  Glucose     70 - 99 mg/dL 454 (H) 098 (H) 119 (H)  BUN     6 - 23 mg/dL 41 (H) 34 (H) 36 (H)  Creat     0.50 - 1.35 mg/dL 1.47 (H) 8.29 (H) 5.62 (H)  Calcium     8.4 - 10.5 mg/dL 8.8 9.2 8.8  GFR calc non Af Amer     >90 mL/min 31 (L)  12 (L)  GFR calc Af Amer     >90 mL/min 36 (L)  14 (L)  Creat     0.50 - 1.35 mg/dL       1. Hypokalemia   2. Renal insufficiency   3. Dehydration       MDM  Nonspecific vomiting, with hypokalemia, and renal insufficiency, worse from baseline.        Flint Melter, MD 05/30/11 0010

## 2011-05-29 NOTE — ED Notes (Signed)
Dr. Effie Shy notified of pts labs.

## 2011-05-30 ENCOUNTER — Encounter (HOSPITAL_COMMUNITY): Payer: Self-pay | Admitting: *Deleted

## 2011-05-30 ENCOUNTER — Inpatient Hospital Stay (HOSPITAL_COMMUNITY): Payer: Medicare PPO

## 2011-05-30 DIAGNOSIS — R112 Nausea with vomiting, unspecified: Secondary | ICD-10-CM | POA: Diagnosis present

## 2011-05-30 DIAGNOSIS — E871 Hypo-osmolality and hyponatremia: Secondary | ICD-10-CM | POA: Insufficient documentation

## 2011-05-30 DIAGNOSIS — N179 Acute kidney failure, unspecified: Secondary | ICD-10-CM | POA: Diagnosis present

## 2011-05-30 DIAGNOSIS — E876 Hypokalemia: Secondary | ICD-10-CM

## 2011-05-30 DIAGNOSIS — I10 Essential (primary) hypertension: Secondary | ICD-10-CM

## 2011-05-30 LAB — CBC
HCT: 26 % — ABNORMAL LOW (ref 39.0–52.0)
Hemoglobin: 9.1 g/dL — ABNORMAL LOW (ref 13.0–17.0)
MCV: 89 fL (ref 78.0–100.0)
RBC: 2.92 MIL/uL — ABNORMAL LOW (ref 4.22–5.81)
RDW: 18.3 % — ABNORMAL HIGH (ref 11.5–15.5)
WBC: 26.5 10*3/uL — ABNORMAL HIGH (ref 4.0–10.5)

## 2011-05-30 LAB — BASIC METABOLIC PANEL
CO2: 12 mEq/L — ABNORMAL LOW (ref 19–32)
Chloride: 113 mEq/L — ABNORMAL HIGH (ref 96–112)
Chloride: 112 mEq/L (ref 96–112)
Creatinine, Ser: 4.07 mg/dL — ABNORMAL HIGH (ref 0.50–1.35)
GFR calc Af Amer: 16 mL/min — ABNORMAL LOW (ref >90)
GFR calc Af Amer: 18 mL/min — ABNORMAL LOW (ref >90)
Potassium: 2.8 mEq/L — ABNORMAL LOW (ref 3.5–5.1)
Potassium: 3.1 mEq/L — ABNORMAL LOW (ref 3.5–5.1)

## 2011-05-30 LAB — URINALYSIS, ROUTINE W REFLEX MICROSCOPIC
Ketones, ur: NEGATIVE mg/dL
Leukocytes, UA: NEGATIVE
Nitrite: NEGATIVE
Protein, ur: 30 mg/dL — AB
pH: 5 (ref 5.0–8.0)

## 2011-05-30 LAB — URINE MICROSCOPIC-ADD ON

## 2011-05-30 LAB — MAGNESIUM: Magnesium: 1.9 mg/dL (ref 1.5–2.5)

## 2011-05-30 LAB — CARDIAC PANEL(CRET KIN+CKTOT+MB+TROPI): Relative Index: 2.8 — ABNORMAL HIGH (ref 0.0–2.5)

## 2011-05-30 MED ORDER — PROMETHAZINE HCL 25 MG/ML IJ SOLN
12.5000 mg | Freq: Four times a day (QID) | INTRAMUSCULAR | Status: DC | PRN
  Administered 2011-05-30 (×2): 12.5 mg via INTRAVENOUS
  Filled 2011-05-30 (×2): qty 1

## 2011-05-30 MED ORDER — ONDANSETRON HCL 4 MG/2ML IJ SOLN
4.0000 mg | Freq: Four times a day (QID) | INTRAMUSCULAR | Status: DC | PRN
  Administered 2011-05-30: 4 mg via INTRAVENOUS

## 2011-05-30 MED ORDER — MAGNESIUM SULFATE 40 MG/ML IJ SOLN
2.0000 g | Freq: Once | INTRAMUSCULAR | Status: AC
  Administered 2011-05-30: 2 g via INTRAVENOUS
  Filled 2011-05-30: qty 50

## 2011-05-30 MED ORDER — SODIUM CHLORIDE 0.9 % IJ SOLN
3.0000 mL | Freq: Two times a day (BID) | INTRAMUSCULAR | Status: DC
  Administered 2011-05-30 – 2011-06-02 (×8): 3 mL via INTRAVENOUS

## 2011-05-30 MED ORDER — ONDANSETRON HCL 4 MG PO TABS: 4.0000 mg | ORAL_TABLET | Freq: Four times a day (QID) | ORAL | Status: DC | PRN

## 2011-05-30 MED ORDER — POTASSIUM CHLORIDE IN NACL 20-0.9 MEQ/L-% IV SOLN
INTRAVENOUS | Status: AC
  Filled 2011-05-30: qty 1000

## 2011-05-30 MED ORDER — SIMVASTATIN 20 MG PO TABS
20.0000 mg | ORAL_TABLET | Freq: Every day | ORAL | Status: DC
  Administered 2011-05-30 – 2011-06-02 (×4): 20 mg via ORAL
  Filled 2011-05-30 (×4): qty 1

## 2011-05-30 MED ORDER — POTASSIUM CHLORIDE 10 MEQ/100ML IV SOLN
10.0000 meq | INTRAVENOUS | Status: AC
  Administered 2011-05-30 (×4): 10 meq via INTRAVENOUS
  Filled 2011-05-30 (×4): qty 100

## 2011-05-30 MED ORDER — CLONIDINE HCL 0.2 MG PO TABS
0.2000 mg | ORAL_TABLET | Freq: Two times a day (BID) | ORAL | Status: DC
  Filled 2011-05-30: qty 1

## 2011-05-30 MED ORDER — POTASSIUM CHLORIDE IN NACL 20-0.9 MEQ/L-% IV SOLN
INTRAVENOUS | Status: DC
  Administered 2011-05-30 (×2): via INTRAVENOUS
  Filled 2011-05-30: qty 1000

## 2011-05-30 MED ORDER — CLONIDINE HCL 0.3 MG PO TABS
0.3000 mg | ORAL_TABLET | Freq: Two times a day (BID) | ORAL | Status: DC
  Administered 2011-05-30: 0.3 mg via ORAL
  Filled 2011-05-30 (×3): qty 1

## 2011-05-30 MED ORDER — CLONIDINE HCL 0.2 MG PO TABS
0.2000 mg | ORAL_TABLET | Freq: Two times a day (BID) | ORAL | Status: DC
  Administered 2011-05-30 – 2011-06-02 (×7): 0.2 mg via ORAL
  Filled 2011-05-30 (×9): qty 1

## 2011-05-30 MED ORDER — HYDRALAZINE HCL 25 MG PO TABS
25.0000 mg | ORAL_TABLET | Freq: Three times a day (TID) | ORAL | Status: DC
  Administered 2011-05-30 – 2011-06-02 (×10): 25 mg via ORAL
  Filled 2011-05-30 (×14): qty 1

## 2011-05-30 MED ORDER — POTASSIUM CHLORIDE 2 MEQ/ML IV SOLN
INTRAVENOUS | Status: DC
  Administered 2011-05-30 – 2011-05-31 (×2): via INTRAVENOUS
  Filled 2011-05-30 (×6): qty 1000

## 2011-05-30 MED ORDER — POLYVINYL ALCOHOL 1.4 % OP SOLN
1.0000 [drp] | OPHTHALMIC | Status: DC | PRN
  Filled 2011-05-30: qty 15

## 2011-05-30 MED ORDER — ACETAMINOPHEN 325 MG PO TABS: 650.0000 mg | ORAL_TABLET | Freq: Four times a day (QID) | ORAL | Status: DC | PRN

## 2011-05-30 MED ORDER — ACETAMINOPHEN 650 MG RE SUPP: 650.0000 mg | Freq: Four times a day (QID) | RECTAL | Status: DC | PRN

## 2011-05-30 MED ORDER — FOLIC ACID 1 MG PO TABS
1.0000 mg | ORAL_TABLET | Freq: Every day | ORAL | Status: DC
  Administered 2011-05-31 – 2011-06-02 (×3): 1 mg via ORAL
  Filled 2011-05-30 (×4): qty 1

## 2011-05-30 MED ORDER — HYDRALAZINE HCL 20 MG/ML IJ SOLN
10.0000 mg | Freq: Four times a day (QID) | INTRAMUSCULAR | Status: DC | PRN
  Administered 2011-05-30: 10 mg via INTRAVENOUS
  Filled 2011-05-30: qty 0.5

## 2011-05-30 MED ORDER — VALACYCLOVIR HCL 500 MG PO TABS
500.0000 mg | ORAL_TABLET | Freq: Every day | ORAL | Status: DC
  Administered 2011-05-30 – 2011-06-02 (×4): 500 mg via ORAL
  Filled 2011-05-30 (×4): qty 1

## 2011-05-30 MED ORDER — SODIUM CHLORIDE 0.9 % IV SOLN: INTRAVENOUS | Status: DC

## 2011-05-30 MED ORDER — AMLODIPINE BESYLATE 10 MG PO TABS
10.0000 mg | ORAL_TABLET | Freq: Every day | ORAL | Status: DC
  Administered 2011-05-30 – 2011-06-02 (×4): 10 mg via ORAL
  Filled 2011-05-30 (×4): qty 1

## 2011-05-30 MED ORDER — DEXAMETHASONE 6 MG PO TABS: 20.0000 mg | ORAL_TABLET | ORAL | Status: DC

## 2011-05-30 MED ORDER — PANTOPRAZOLE SODIUM 40 MG PO TBEC: 40.0000 mg | DELAYED_RELEASE_TABLET | Freq: Every day | ORAL | Status: DC

## 2011-05-30 MED ORDER — HEPARIN SODIUM (PORCINE) 5000 UNIT/ML IJ SOLN
5000.0000 [IU] | Freq: Three times a day (TID) | INTRAMUSCULAR | Status: DC
  Administered 2011-05-30 (×3): 5000 [IU] via SUBCUTANEOUS
  Filled 2011-05-30 (×7): qty 1

## 2011-05-30 MED ORDER — PANTOPRAZOLE SODIUM 40 MG IV SOLR
40.0000 mg | Freq: Every day | INTRAVENOUS | Status: DC
  Administered 2011-05-30 – 2011-05-31 (×2): 40 mg via INTRAVENOUS
  Filled 2011-05-30 (×4): qty 40

## 2011-05-30 NOTE — Progress Notes (Signed)
CARE MANAGEMENT NOTE 05/30/2011  Patient:  Calvin Mccormick, Calvin Mccormick   Account Number:  [id number]  Date Initiated:  05/30/2011  Documentation initiated by:  Cassondra Stachowski  Subjective/Objective Assessment:   pt with k+ 2.6 and history of nausea and vomiting/     Action/Plan:   lives at home with wife   Anticipated DC Date:  06/02/2011   Anticipated DC Plan:  HOME/SELF CARE  In-house referral  NA      DC Planning Services  NA      [facility]  NA   Choice offered to / List presented to:  NA   DME arranged  NA      DME agency  NA     HH arranged  NA      HH agency  NA   Status of service:  In process, will continue to follow Medicare Important Message given?  YES (If response is "NO", the following Medicare IM given date fields will be blank) Date Medicare IM given:  05/29/2011 Date Additional Medicare IM given:    Discharge Disposition:    Per UR Regulation:  Reviewed for med. necessity/level of care/duration of stay  If discussed at Long Length of Stay Meetings, dates discussed:    Comments:  [id number]/Yukari Flax Earlene Plater, RN, BSN, CCM No discharge needs present at time of this review at the sdu/icu level. Case Management [phone]

## 2011-05-30 NOTE — ED Notes (Signed)
Bed request changed to stepdown.  

## 2011-05-30 NOTE — Progress Notes (Signed)
Subjective: Lethargic, sleepy, vomited scant amt x1 overnight, denies abdominal pain now  Objective: Vital signs in last 24 hours: Temp:  [97.9 F (36.6 C)-98.1 F (36.7 C)] 98 F (36.7 C) (05/23 0400) Pulse Rate:  [68-139] 110  (05/23 0200) Resp:  [13-26] 20  (05/23 0200) BP: (153-207)/(65-96) 157/84 mmHg (05/23 0235) SpO2:  [100 %] 100 % (05/23 0200) Weight:  [69.4 kg (153 lb)] 69.4 kg (153 lb) (05/23 0200) Weight change:     Intake/Output from previous day: 05/22 0701 - 05/23 0700 In: 678.3 [I.V.:328.3; IV Piggyback:350] Out: -      Physical Exam: General: lethargic, easily aroused, oriented  x2, in no acute distress. HEENT: No bruits, no goiter, dry mucosa Heart: Regular rate and rhythm, without murmurs, rubs, gallops. Lungs: Clear to auscultation bilaterally. Abdomen: Soft, nontender, nondistended, positive bowel sounds. Extremities: No clubbing cyanosis or edema with positive pedal pulses. Neuro: Grossly intact, nonfocal.    Lab Results: Basic Metabolic Panel:  Basename 05/30/11 0259 05/29/11 1835 05/29/11 1830  NA 145 142 --  K 2.8* 2.9* --  CL 113* 105 --  CO2 12* 11* --  GLUCOSE 136* 133* --  BUN 37* 36* --  CREATININE 4.07* 4.55* --  CALCIUM 8.0* 8.8 --  MG -- -- 1.2*  PHOS -- -- --   Liver Function Tests: No results found for this basename: AST:2,ALT:2,ALKPHOS:2,BILITOT:2,PROT:2,ALBUMIN:2 in the last 72 hours  Basename 05/29/11 1835  LIPASE 93*  AMYLASE --   No results found for this basename: AMMONIA:2 in the last 72 hours CBC:  Basename 05/30/11 0259 05/29/11 1835  WBC 26.5* 32.7*  NEUTROABS -- 29.0*  HGB 9.1* 10.0*  HCT 26.0* 28.8*  MCV 89.0 91.4  PLT 249 322   Cardiac Enzymes:  Basename 05/30/11 0259  CKTOTAL 159  CKMB 4.5*  CKMBINDEX --  TROPONINI <0.30   BNP: No results found for this basename: PROBNP:3 in the last 72 hours D-Dimer: No results found for this basename: DDIMER:2 in the last 72 hours CBG: No results found  for this basename: GLUCAP:6 in the last 72 hours Hemoglobin A1C: No results found for this basename: HGBA1C in the last 72 hours Fasting Lipid Panel: No results found for this basename: CHOL,HDL,LDLCALC,TRIG,CHOLHDL,LDLDIRECT in the last 72 hours Thyroid Function Tests: No results found for this basename: TSH,T4TOTAL,FREET4,T3FREE,THYROIDAB in the last 72 hours Anemia Panel: No results found for this basename: VITAMINB12,FOLATE,FERRITIN,TIBC,IRON,RETICCTPCT in the last 72 hours Coagulation: No results found for this basename: LABPROT:2,INR:2 in the last 72 hours Urine Drug Screen: Drugs of Abuse  No results found for this basename: labopia, cocainscrnur, labbenz, amphetmu, thcu, labbarb    Alcohol Level: No results found for this basename: ETH:2 in the last 72 hours Urinalysis: No results found for this basename: COLORURINE:2,APPERANCEUR:2,LABSPEC:2,PHURINE:2,GLUCOSEU:2,HGBUR:2,BILIRUBINUR:2,KETONESUR:2,PROTEINUR:2,UROBILINOGEN:2,NITRITE:2,LEUKOCYTESUR:2 in the last 72 hours  Recent Results (from the past 240 hour(s))  TECHNOLOGIST REVIEW     Status: Normal   Collection Time   05/27/11  1:36 PM      Component Value Range Status Comment   Technologist Review Rare metamyelocyte   Final   MRSA PCR SCREENING     Status: Normal   Collection Time   05/30/11  2:39 AM      Component Value Range Status Comment   MRSA by PCR NEGATIVE  NEGATIVE  Final     Studies/Results: Dg Abd Acute W/chest  05/29/2011  *RADIOLOGY REPORT*  Clinical Data: Abdominal pain  ACUTE ABDOMEN SERIES (ABDOMEN 2 VIEW & CHEST 1 VIEW)  Comparison: None.  Findings:  Heart size upper normal limits.  Mild central vascular congestion.  No overt edema or focal consolidation.  No free intraperitoneal air.  Relative paucity of small bowel gas. Leftward curvature of the thoracolumbar junction with L1-2 fusion. Osteopenia.  IMPRESSION: Nonobstructive bowel gas pattern.  Original Report Authenticated By: Waneta Martins, M.D.     Medications: Scheduled Meds:   . 0.9 % NaCl with KCl 20 mEq / L      . amLODipine  10 mg Oral Daily  . cloNIDine  0.3 mg Oral BID  . dexamethasone  20 mg Oral Q7 days  . folic acid  1 mg Oral Daily  . heparin  5,000 Units Subcutaneous Q8H  . hydrALAZINE  25 mg Oral Q8H  .  HYDROmorphone (DILAUDID) injection  1 mg Intravenous Once  . magnesium sulfate 1 - 4 g bolus IVPB  2 g Intravenous Once  . ondansetron  4 mg Intravenous Once  . ondansetron  4 mg Intravenous Once  . pantoprazole (PROTONIX) IV  40 mg Intravenous QHS  . potassium chloride  10 mEq Intravenous Q1 Hr x 4  . simvastatin  20 mg Oral Daily  . sodium chloride  3 mL Intravenous Q12H  . valACYclovir  500 mg Oral Daily  . DISCONTD: pantoprazole  40 mg Oral Daily   Continuous Infusions:   . sodium chloride 0.9 % 1,000 mL with potassium chloride 20 mEq, sodium bicarbonate 50 mEq infusion    . DISCONTD: sodium chloride 1,000 mL (05/29/11 2308)  . DISCONTD: 0.9 % NaCl with KCl 20 mEq / L 100 mL/hr at 05/30/11 0600   PRN Meds:.acetaminophen, acetaminophen, hydrALAZINE, ondansetron (ZOFRAN) IV, ondansetron, polyvinyl alcohol, promethazine  Assessment/Plan: 1. Nausea & vomiting: likely chemo related supportive care, IVF, anti-emetics, if persists or worsens will get CT abd pelvis without contrast 2. ARF with metabolic acidosis: multifactorial, ACE/NSAIDs, chemotherapy and volume depletion from N/V/Poor PO intake Baseline creatinine 1.7 in 4/13 continue IVF, change to D5 with 2amps Na-Bicarb Place foley STAT, I/Os Renal USG Renal consulted 3. Multiple myeloma: on active chemo, last on Monday Will notify Oncology of admission 4. Hypokalemia: replace 5. Uncontrolled HTN: continue Clonidine, add Hydralazine/Amlidipine 6. Leukocytosis: unclear if he received Neulasta, afebrile with no clear source of infection at this point, CXR/KuB unremarkable, UA pending Continue to Monitor 7. Anemia: CKD, Myeloma, chemo: stable 8.DVT  prophylaxis: Hep SQ   LOS: 1 day   [facility] Pager: [phone] 05/30/2011, 7:47 AM

## 2011-05-30 NOTE — Progress Notes (Signed)
INITIAL ADULT NUTRITION ASSESSMENT Date: 05/30/2011   Time: 11:17 AM Reason for Assessment: Nutrition Risk for Weight Loss  ASSESSMENT: Male 66 y.o.  Dx: Nausea & vomiting- likely chemo related, ARF with metabolic acidosis-multifactorial (ACE/NSAIDs, chemo and volume depletion), Multiple myeloma on active chemo, anemia, leukocytosis, uncontrolled HTN  Hx:  Past Medical History  Diagnosis Date  . Anemia 11/27/2010  . Hypertension, benign essential, goal below 140/90 11/27/2010  . Multiple myeloma without mention of remission 12/12/2010  . Multiple myeloma without mention of remission 12/12/2010  . CKD (chronic kidney disease), stage III 02/05/2011  . Hyperlipemia     Related Meds:  Scheduled Meds:   . 0.9 % NaCl with KCl 20 mEq / L      . amLODipine  10 mg Oral Daily  . cloNIDine  0.2 mg Oral BID  . dexamethasone  20 mg Oral Q7 days  . folic acid  1 mg Oral Daily  . heparin  5,000 Units Subcutaneous Q8H  . hydrALAZINE  25 mg Oral Q8H  .  HYDROmorphone (DILAUDID) injection  1 mg Intravenous Once  . magnesium sulfate 1 - 4 g bolus IVPB  2 g Intravenous Once  . ondansetron  4 mg Intravenous Once  . ondansetron  4 mg Intravenous Once  . pantoprazole (PROTONIX) IV  40 mg Intravenous QHS  . potassium chloride  10 mEq Intravenous Q1 Hr x 4  . potassium chloride  10 mEq Intravenous Q1 Hr x 4  . simvastatin  20 mg Oral Daily  . sodium chloride  3 mL Intravenous Q12H  . valACYclovir  500 mg Oral Daily  . DISCONTD: cloNIDine  0.3 mg Oral BID  . DISCONTD: pantoprazole  40 mg Oral Daily   Continuous Infusions:   . dextrose 5 % 1,000 mL with potassium chloride 20 mEq, sodium bicarbonate 100 mEq infusion 100 mL/hr at 05/30/11 0853  . DISCONTD: sodium chloride 1,000 mL (05/29/11 2308)  . DISCONTD: 0.9 % NaCl with KCl 20 mEq / L 100 mL/hr at 05/30/11 0600  . DISCONTD: sodium chloride 0.9 % 1,000 mL with potassium chloride 20 mEq, sodium bicarbonate 50 mEq infusion     PRN  Meds:.acetaminophen, acetaminophen, hydrALAZINE, ondansetron (ZOFRAN) IV, ondansetron, polyvinyl alcohol, promethazine  Ht: 5\' 11"  (180.3 cm)  Wt: 153 lb (69.4 kg)  Ideal Wt: 78 % Ideal Wt: 89  Usual Wt: 180# prior to illness per pt Wt Readings from Last 15 Encounters:  05/30/11 153 lb (69.4 kg)  05/13/11 185 lb 4.8 oz (84.052 kg)  04/08/11 176 lb 1.6 oz (79.878 kg)  03/11/11 190 lb 12.8 oz (86.546 kg)  03/03/11 198 lb (89.812 kg)  02/05/11 197 lb 12.8 oz (89.721 kg)  01/17/11 200 lb 3.2 oz (90.81 kg)  01/04/11 201 lb 8 oz (91.4 kg)  12/12/10 201 lb 9.6 oz (91.445 kg)  11/26/10 201 lb 1.6 oz (91.218 kg)    % Usual Wt: 85%  Body mass index is 21.34 kg/(m^2).  Food/Nutrition Related Hx: Pt states usually eating well.  Weight loss has been occurring over the past 3 weeks.  Has not been taking supplements.  Labs:  CMP     Component Value Date/Time   NA 144 05/30/2011 0925   K 3.1* 05/30/2011 0925   CL 112 05/30/2011 0925   CO2 13* 05/30/2011 0925   GLUCOSE 131* 05/30/2011 0925   BUN 34* 05/30/2011 0925   CREATININE 3.75* 05/30/2011 0925   CREATININE 2.03* 12/14/2010 1114   CALCIUM 8.6 05/30/2011 0925  PROT 5.7* 04/08/2011 1131   ALBUMIN 3.7 04/08/2011 1131   AST 19 04/08/2011 1131   ALT 24 04/08/2011 1131   ALKPHOS 72 04/08/2011 1131   BILITOT 0.5 04/08/2011 1131   GFRNONAA 15* 05/30/2011 0925   GFRAA 18* 05/30/2011 0925    I/O last 3 completed shifts: In: 678.3 [I.V.:328.3; IV Piggyback:350] Out: -  Total I/O In: 332 [P.O.:30; I.V.:300; IV Piggyback:2] Out: 235 [Urine:235]   Diet Order: Clear Liquid not tolerating  Supplements/Tube Feeding:none  IVF:    dextrose 5 % 1,000 mL with potassium chloride 20 mEq, sodium bicarbonate 100 mEq infusion Last Rate: 100 mL/hr at 05/30/11 2130  DISCONTD: sodium chloride Last Rate: 1,000 mL (05/29/11 2308)  DISCONTD: 0.9 % NaCl with KCl 20 mEq / L Last Rate: 100 mL/hr at 05/30/11 0600  DISCONTD: sodium chloride 0.9 % 1,000 mL with  potassium chloride 20 mEq, sodium bicarbonate 50 mEq infusion     Estimated Nutritional Needs:   Kcal:  Protein:  Fluid:   Not tolerating the clear liquid diet.  N/V, dry heaves.  23# weight loss in the past 3 weeks.  Meets criteria for Severe Malnutrition related to chronic illness AEB weight loss of 13% in the past 3 weeks and intake ,75% in the past months.  Decreasing muscle mass and body fat as well.    NUTRITION DIAGNOSIS: -Inadequate oral intake (NI-2.1).  Status: Ongoing  RELATED TO: nausea  AS EVIDENCE BY: vomiting, clear liquid diet  MONITORING/EVALUATION(Goals): Monitor:  Intake, diet advancement, weight, labs Goal:  Maximize intake as tolerated to prevent further weight loss.  EDUCATION NEEDS: -No education needs identified at this time  INTERVENTION: 1.  Continue Clear Liquid diet until N/V resolved.  Diet advancement per MD 2.  Add supplement when tolerating clear liquids.  Dietitian [phone]  DOCUMENTATION CODES Per approved criteria  -Severe malnutrition in the context of chronic illness    Derrell Lolling Anastasia Fiedler 05/30/2011, 11:17 AM

## 2011-05-30 NOTE — H&P (Signed)
Patient's PCP: Jeri Cos, MD, MD  Chief Complaint: nausea/vomiting  History of Present Illness: Calvin Mccormick is a 66 y.o. African American male who has a history of multiple myeloma, getting chemotherapy.  Last dose was Monday. Patient has not eaten since Saturday per wife.  He comes in with severe nausea and vomiting.  He is c/o stomach pain, and uncontrolled nausea and vomiting- unable to take his medications at home.  No fever, no chills.  No blood in his stools  In the ER he was found to have a low potasium and high creatinine.  Patient has not been able to urinate in the ER yet.  BP was also found to be high- patient not having head ache, CP, or blurred vision.    Meds: Scheduled Meds:   .  HYDROmorphone (DILAUDID) injection  1 mg Intravenous Once  . ondansetron  4 mg Intravenous Once  . ondansetron  4 mg Intravenous Once  . potassium chloride  10 mEq Intravenous Q1 Hr x 4   Continuous Infusions:   . sodium chloride 1,000 mL (05/29/11 2308)   PRN Meds:.hydrALAZINE, promethazine Allergies: Review of patient's allergies indicates no known allergies. Past Medical History  Diagnosis Date  . Anemia 11/27/2010  . Hypertension, benign essential, goal below 140/90 11/27/2010  . Multiple myeloma without mention of remission 12/12/2010  . Multiple myeloma without mention of remission 12/12/2010  . CKD (chronic kidney disease), stage III 02/05/2011  . Hyperlipemia    History reviewed. No pertinent past surgical history. History reviewed. No pertinent family history. History   Social History  . Marital Status: Married    Spouse Name: N/A    Number of Children: N/A  . Years of Education: N/A   Occupational History  . Not on file.   Social History Main Topics  . Smoking status: Not on file  . Smokeless tobacco: Not on file  . Alcohol Use:   . Drug Use:   . Sexually Active:    Other Topics Concern  . Not on file   Social History Narrative  . No narrative on file     Review of Systems: All systems reviewed with the patient and positive as per history of present illness, otherwise all other systems are negative.   Physical Exam: Blood pressure 187/67, pulse 84, temperature 97.9 F (36.6 C), temperature source Oral, resp. rate 14, SpO2 100.00%. General: Awake, Oriented x3, appears weak HEENT: EOMI, dry mucous membranes Neck: Supple CV: S1 and S2, sinus irregular Lungs: Clear to ascultation bilaterally, no wheezing Abdomen: Soft, minimal tender, Nondistended, +bowel sounds. Ext: Good pulses. Trace edema. No clubbing or cyanosis noted. Neuro: Cranial Nerves II-XII grossly intact. Generalized weakness    Lab results:  Basename 05/29/11 1835 05/29/11 1830  NA 142 --  K 2.9* --  CL 105 --  CO2 11* --  GLUCOSE 133* --  BUN 36* --  CREATININE 4.55* --  CALCIUM 8.8 --  MG -- 1.2*  PHOS -- --   No results found for this basename: AST:2,ALT:2,ALKPHOS:2,BILITOT:2,PROT:2,ALBUMIN:2 in the last 72 hours  Basename 05/29/11 1835  LIPASE 93*  AMYLASE --    Basename 05/29/11 1835 05/27/11 1336  WBC 32.7* 15.2*  NEUTROABS 29.0* 11.9*  HGB 10.0* 8.1*  HCT 28.8* 24.0*  MCV 91.4 91.3  PLT 322 238   No results found for this basename: CKTOTAL:3,CKMB:3,CKMBINDEX:3,TROPONINI:3 in the last 72 hours No components found with this basename: POCBNP:3 No results found for this basename: DDIMER in the last 72  hours No results found for this basename: HGBA1C:2 in the last 72 hours No results found for this basename: CHOL:2,HDL:2,LDLCALC:2,TRIG:2,CHOLHDL:2,LDLDIRECT:2 in the last 72 hours No results found for this basename: TSH,T4TOTAL,FREET3,T3FREE,THYROIDAB in the last 72 hours No results found for this basename: VITAMINB12:2,FOLATE:2,FERRITIN:2,TIBC:2,IRON:2,RETICCTPCT:2 in the last 72 hours Imaging results:  Dg Abd Acute W/chest  05/29/2011  *RADIOLOGY REPORT*  Clinical Data: Abdominal pain  ACUTE ABDOMEN SERIES (ABDOMEN 2 VIEW & CHEST 1 VIEW)   Comparison: None.  Findings: Heart size upper normal limits.  Mild central vascular congestion.  No overt edema or focal consolidation.  No free intraperitoneal air.  Relative paucity of small bowel gas. Leftward curvature of the thoracolumbar junction with L1-2 fusion. Osteopenia.  IMPRESSION: Nonobstructive bowel gas pattern.  Original Report Authenticated By: Waneta Martins, M.D.   Other results: EKG: sinus arrythmia  Assessment & Plan by Problem:   *Nausea & vomiting- zofran, prn phenergan, xray does not show obstruction, chemo related,   Multiple myeloma without mention of remission- got treatment on Monday, consult heme onc if needed   AKI (acute kidney injury)- IVF,   CKD (chronic kidney disease), stage III  Hypokalemia- repleat, Mg also low  hypomagnesium- IV replacement  Uncontrolled HTN- give home clonidine dose (if he can tolerate PO) and IV hydralazine  Leukocytosis- ?chemo/neulasta??- hold abx for now, will start if fever developes  Anemia- likely chronic disease   Will place in step down for closer managing of his K+ and blood pressure  Code status- patient to talk with family before giving answer  Time spent on admission, talking to the patient, and coordinating care was: 55 mins.  Lechelle Wrigley, DO 05/30/2011, 12:56 AM

## 2011-05-30 NOTE — Consult Note (Signed)
  Pt. With  Dehydration. BUN  elevated

## 2011-05-31 ENCOUNTER — Other Ambulatory Visit: Payer: Self-pay | Admitting: Oncology

## 2011-05-31 DIAGNOSIS — K5289 Other specified noninfective gastroenteritis and colitis: Secondary | ICD-10-CM

## 2011-05-31 DIAGNOSIS — N189 Chronic kidney disease, unspecified: Secondary | ICD-10-CM

## 2011-05-31 DIAGNOSIS — N179 Acute kidney failure, unspecified: Secondary | ICD-10-CM

## 2011-05-31 DIAGNOSIS — E876 Hypokalemia: Secondary | ICD-10-CM

## 2011-05-31 DIAGNOSIS — R112 Nausea with vomiting, unspecified: Secondary | ICD-10-CM

## 2011-05-31 DIAGNOSIS — C9000 Multiple myeloma not having achieved remission: Secondary | ICD-10-CM

## 2011-05-31 DIAGNOSIS — I10 Essential (primary) hypertension: Secondary | ICD-10-CM

## 2011-05-31 LAB — BASIC METABOLIC PANEL
BUN: 25 mg/dL — ABNORMAL HIGH (ref 6–23)
CO2: 22 mEq/L (ref 19–32)
Calcium: 8 mg/dL — ABNORMAL LOW (ref 8.4–10.5)
Creatinine, Ser: 2.31 mg/dL — ABNORMAL HIGH (ref 0.50–1.35)

## 2011-05-31 LAB — CBC
MCH: 31.9 pg (ref 26.0–34.0)
MCV: 90.1 fL (ref 78.0–100.0)
Platelets: 189 10*3/uL (ref 150–400)
RBC: 2.32 MIL/uL — ABNORMAL LOW (ref 4.22–5.81)
RDW: 18.4 % — ABNORMAL HIGH (ref 11.5–15.5)

## 2011-05-31 LAB — LACTIC ACID, PLASMA: Lactic Acid, Venous: 1.1 mmol/L (ref 0.5–2.2)

## 2011-05-31 MED ORDER — SODIUM CHLORIDE 0.9 % IV SOLN
INTRAVENOUS | Status: DC
  Administered 2011-05-31 – 2011-06-01 (×2): via INTRAVENOUS

## 2011-05-31 MED ORDER — ENOXAPARIN SODIUM 30 MG/0.3ML ~~LOC~~ SOLN
30.0000 mg | SUBCUTANEOUS | Status: DC
  Administered 2011-05-31 – 2011-06-01 (×2): 30 mg via SUBCUTANEOUS
  Filled 2011-05-31 (×2): qty 0.3

## 2011-05-31 MED ORDER — ENOXAPARIN SODIUM 40 MG/0.4ML ~~LOC~~ SOLN: 40.0000 mg | SUBCUTANEOUS | Status: DC

## 2011-05-31 MED ORDER — POTASSIUM CHLORIDE CRYS ER 20 MEQ PO TBCR
40.0000 meq | EXTENDED_RELEASE_TABLET | Freq: Every day | ORAL | Status: DC
  Administered 2011-05-31 – 2011-06-02 (×3): 40 meq via ORAL
  Filled 2011-05-31 (×3): qty 2

## 2011-05-31 NOTE — Clinical Documentation Improvement (Signed)
MALNUTRITION DOCUMENTATION CLARIFICATION  THIS DOCUMENT IS NOT A PERMANENT PART OF THE MEDICAL RECORD  TO RESPOND TO THE THIS QUERY, FOLLOW THE INSTRUCTIONS BELOW:  1. If needed, update documentation for the patient's encounter via the notes activity.  2. Access this query again and click edit on the In [employer].  3. After updating, or not, click F2 to complete all highlighted (required) fields concerning your review. Select "additional documentation in the medical record" OR "no additional documentation provided".  4. Click Sign note button.  5. The deficiency will fall out of your In Basket *Please let us know if you are not able to complete this workflow by phone or e-mail (listed below).  Please update your documentation within the medical record to reflect your response to this query.                                                                                        05/31/11   Dear Dr. [facility],  In a better effort to capture your patient's severity of illness, reflect appropriate length of stay and utilization of resources, a review of the patient medical record has revealed the following indicators.    Based on your clinical judgment, please clarify and document in a progress note and/or discharge summary the clinical condition associated with the following supporting information:  In responding to this query please exercise your independent judgment.  The fact that a query is asked, does not imply that any particular answer is desired or expected.  05/30/11 Nutr Assessment..."-Severe malnutrition in the context of chronic illness." For accurate DX specificity & severity, please help validate Nutr documentation noted for cond being eval/mon/ & tx'd. Thank you   Possible Clinical Conditions?  Mild Malnutrition  Moderate Malnutrition Severe Malnutrition   Protein Calorie Malnutrition Severe Protein Calorie Malnutrition Emaciation  Cachexia   Other  Condition Cannot clinically determine  Supporting Information: Risk Factors: See Nutr Assessment 05/30/11  Signs & Symptoms: See Nutr Assessment 05/30/11  -Diagnostics: See Nutr Assessment 05/30/11  Treatments: See Nutr Assessment 05/30/11  -Nutrition Consult: See Nutr Assessment 05/30/11   You may use possible, probable, or suspect with inpatient documentation. possible, probable, suspected diagnoses MUST be documented at the time of discharge  Reviewed:  no additional documentation provided:06/06/11>no add doc & no dc summ avail, closed.orm  Thank You,  Toribio Harbour, RN, BSN, CCDS Certified Clinical Documentation Specialist Pager: [phone]  Health Information Management [place]

## 2011-05-31 NOTE — Progress Notes (Addendum)
Subjective: Feels better, no N/V, wants to eat more, more arousible Objective: Vital signs in last 24 hours: Temp:  [97.4 F (36.3 C)-99.5 F (37.5 C)] 99.4 F (37.4 C) (05/24 0000) Pulse Rate:  [49-89] 66  (05/24 0700) Resp:  [9-22] 21  (05/24 0700) BP: (115-191)/(56-85) 121/68 mmHg (05/24 0700) SpO2:  [100 %] 100 % (05/24 0700) Weight change:  Last BM Date: 05/30/11  Intake/Output from previous day: 05/23 0701 - 05/24 0700 In: 2825 [P.O.:120; I.V.:2303; IV Piggyback:402] Out: 1765 [Urine:1765]     Physical Exam: General: awake, alert, oriented x3, in no acute distress. HEENT: No bruits, no goiter, dry mucosa Heart: Regular rate and rhythm, without murmurs, rubs, gallops. Lungs: Clear to auscultation bilaterally. Abdomen: Soft, nontender, nondistended, positive bowel sounds. Extremities: No clubbing cyanosis or edema with positive pedal pulses. Neuro: Grossly intact, nonfocal.    Lab Results: Basic Metabolic Panel:  Basename 05/31/11 0330 05/30/11 0925 05/29/11 1830  NA 139 144 --  K 3.1* 3.1* --  CL 108 112 --  CO2 22 13* --  GLUCOSE 104* 131* --  BUN 25* 34* --  CREATININE 2.31* 3.75* --  CALCIUM 8.0* 8.6 --  MG -- 1.9 1.2*  PHOS -- -- --   Liver Function Tests: No results found for this basename: AST:2,ALT:2,ALKPHOS:2,BILITOT:2,PROT:2,ALBUMIN:2 in the last 72 hours  Basename 05/29/11 1835  LIPASE 93*  AMYLASE --   No results found for this basename: AMMONIA:2 in the last 72 hours CBC:  Basename 05/31/11 0330 05/30/11 0259 05/29/11 1835  WBC 14.8* 26.5* --  NEUTROABS -- -- 29.0*  HGB 7.4* 9.1* --  HCT 20.9* 26.0* --  MCV 90.1 89.0 --  PLT 189 249 --   Cardiac Enzymes:  Basename 05/30/11 0259  CKTOTAL 159  CKMB 4.5*  CKMBINDEX --  TROPONINI <0.30   BNP: No results found for this basename: PROBNP:3 in the last 72 hours D-Dimer: No results found for this basename: DDIMER:2 in the last 72 hours CBG: No results found for this basename:  GLUCAP:6 in the last 72 hours Hemoglobin A1C: No results found for this basename: HGBA1C in the last 72 hours Fasting Lipid Panel: No results found for this basename: CHOL,HDL,LDLCALC,TRIG,CHOLHDL,LDLDIRECT in the last 72 hours Thyroid Function Tests: No results found for this basename: TSH,T4TOTAL,FREET4,T3FREE,THYROIDAB in the last 72 hours Anemia Panel: No results found for this basename: VITAMINB12,FOLATE,FERRITIN,TIBC,IRON,RETICCTPCT in the last 72 hours Coagulation: No results found for this basename: LABPROT:2,INR:2 in the last 72 hours Urine Drug Screen: Drugs of Abuse  No results found for this basename: labopia,  cocainscrnur,  labbenz,  amphetmu,  thcu,  labbarb    Alcohol Level: No results found for this basename: ETH:2 in the last 72 hours Urinalysis:  Basename 05/30/11 0922  COLORURINE YELLOW  LABSPEC 1.019  PHURINE 5.0  GLUCOSEU NEGATIVE  HGBUR TRACE*  BILIRUBINUR NEGATIVE  KETONESUR NEGATIVE  PROTEINUR 30*  UROBILINOGEN 0.2  NITRITE NEGATIVE  LEUKOCYTESUR NEGATIVE    Recent Results (from the past 240 hour(s))  TECHNOLOGIST REVIEW     Status: Normal   Collection Time   05/27/11  1:36 PM      Component Value Range Status Comment   Technologist Review Rare metamyelocyte   Final   MRSA PCR SCREENING     Status: Normal   Collection Time   05/30/11  2:39 AM      Component Value Range Status Comment   MRSA by PCR NEGATIVE  NEGATIVE  Final     Studies/Results: US Renal  05/30/2011  *RADIOLOGY REPORT*  Clinical Data: Acute renal failure.  RENAL/URINARY TRACT ULTRASOUND COMPLETE  Comparison:  Urinary tract ultrasound 02/27/2009 [facility].  Findings:  Right Kidney:  No hydronephrosis.  Mild diffuse cortical thinning. Normal parenchymal echotexture without focal parenchymal abnormality.  No shadowing calculi.  Approximately 8.7 cm in length, decreased since the prior examination where it measured 9.2 cm.  Left Kidney:  No hydronephrosis.  Well-preserved  cortex.  Normal parenchymal echotexture without significant focal parenchymal abnormality. Approximate 2.5 cm simple cyst arising from the medial lower pole.  No shadowing calculi. Approximate 10.0 cm in length, decreased since the prior examination where it measured 11.3 cm.  Bladder:  Decompressed by Foley catheter.  IMPRESSION:  1.  No evidence of hydronephrosis involving either kidney to suggest obstruction. 2.  2.5 cm simple cyst arising from the lower pole of the left kidney.  No significant focal parenchymal abnormalities involving either kidney. 3.  Interval slight decrease in size of both kidneys since the February, 2011 examination.  Original Report Authenticated By: Arnell Sieving, M.D.   Dg Abd Acute W/chest  05/29/2011  *RADIOLOGY REPORT*  Clinical Data: Abdominal pain  ACUTE ABDOMEN SERIES (ABDOMEN 2 VIEW & CHEST 1 VIEW)  Comparison: None.  Findings: Heart size upper normal limits.  Mild central vascular congestion.  No overt edema or focal consolidation.  No free intraperitoneal air.  Relative paucity of small bowel gas. Leftward curvature of the thoracolumbar junction with L1-2 fusion. Osteopenia.  IMPRESSION: Nonobstructive bowel gas pattern.  Original Report Authenticated By: Waneta Martins, M.D.    Medications: Scheduled Meds:    . amLODipine  10 mg Oral Daily  . cloNIDine  0.2 mg Oral BID  . dexamethasone  20 mg Oral Q7 days  . folic acid  1 mg Oral Daily  . heparin  5,000 Units Subcutaneous Q8H  . hydrALAZINE  25 mg Oral Q8H  . pantoprazole (PROTONIX) IV  40 mg Intravenous QHS  . potassium chloride  10 mEq Intravenous Q1 Hr x 4  . simvastatin  20 mg Oral Daily  . sodium chloride  3 mL Intravenous Q12H  . valACYclovir  500 mg Oral Daily  . DISCONTD: cloNIDine  0.2 mg Oral BID  . DISCONTD: cloNIDine  0.3 mg Oral BID   Continuous Infusions:    . dextrose 5 % 1,000 mL with potassium chloride 20 mEq, sodium bicarbonate 100 mEq infusion 100 mL/hr at 05/31/11 0651  .  DISCONTD: sodium chloride 0.9 % 1,000 mL with potassium chloride 20 mEq, sodium bicarbonate 50 mEq infusion     PRN Meds:.acetaminophen, acetaminophen, hydrALAZINE, ondansetron (ZOFRAN) IV, ondansetron, polyvinyl alcohol, promethazine  Assessment/Plan: 1. Nausea & vomiting: likely chemo related supportive care, IVF, anti-emetics, resolved 2. ARF with metabolic acidosis: multifactorial, ACE/NSAIDs, chemotherapy and volume depletion from N/V/Poor PO intake Baseline creatinine 1.7 in 4/13 Improving, DC bicarbonate, change IVF to NS at 75cc/hr Place foley STAT, I/Os Renal USG without obstruction Renal consulted Dr. Bascom Levels yesterday who is also his PCP 3. Multiple myeloma: on active chemo, last on Monday Will notify Oncology of admission 4. Hypokalemia: replace 5. Uncontrolled HTN: improved, continue Clonidine, Hydralazine/Amlidipine 6. Leukocytosis: improving, unclear if he received Neulasta, afebrile with no clear source of infection at this point, CXR/KuB/UA unremarkable, continue to monitor 7. Anemia: CKD, Myeloma, chemo: drop in Hb, worsened by hemodilution now, if drops further will transfuse 8.DVT prophylaxis: Lovenox Transfer to floor PT eval    LOS: 2 days   Tamaiya Bump [facility] Pager:  [phone] 05/31/2011, 7:41 AM

## 2011-05-31 NOTE — Progress Notes (Signed)
Progress Note:  Subjective: 66 year old man well known to me. He was diagnosed with IgG kappa multiple myeloma in November of 2012 when he presented with progressive anemia and progressive renal dysfunction. He was started on treatment with a combination of Velcade, Revlimid, plus dexamethasone in mid December. He has shown signs of response with a fall in his IgG level from pretreatment value of 3360 mg percent to most recent value of 605 mg percent on 05/13/2011. Overall he has tolerated treatment extremely well. He has had no GI symptoms to date. No nausea or vomiting. He did have a temporary adjustment of his Revlimid when his renal function worsened. Renal function subsequently returned to his baseline and his Revlimid dose was increased back to 10 mg 21 days on 7 days off. He received his most recent treatment of subcutaneous Velcade on  May 20. On May 22 he developed sudden onset of nausea vomiting, diarrhea and abdominal pain. He denied any fever, chills, dysuria, or frequency.  He didn't eat anything out of the ordinary. He had no infectious exposures Initial abdominal exam was unremarkable. Abdomen was soft, nontender, not distended, positive bowel sounds. Regular x-rays did not show any evidence for obstruction or free air. He was afebrile. Laboratory evaluation did show hypokalemia with a potassium of 2.9 and  worsening of renal function compared with his baseline with BUN 36 and creatinine 4.5 with baseline creatinine usually 2.0. White blood count was elevated at 32,700. Of note he is not receiving Neulasta.  He has been treated with hydration and electrolyte replacement and parenteral antiemetics and his symptoms have resolved. He developed urinary retention and a Foley catheter was placed. Renal ultrasound showed no evidence of obstruction. With hydration and placement of the Foley catheter his renal function has returned to his approximate baseline and creatinine today is 2.3. Hemoglobin  fell from 9.1 to 7.4 post hydration. His GI symptoms have resolved. No further abdominal discomfort or vomiting.                                                                                                                             Vitals: Filed Vitals:   05/31/11 0700  BP: 121/68  Pulse: 66  Temp:   Resp: 21   Wt Readings from Last 3 Encounters:  05/30/11 153 lb (69.4 kg)  05/13/11 185 lb 4.8 oz (84.052 kg)  04/08/11 176 lb 1.6 oz (79.878 kg)     PHYSICAL EXAM:  General he appears comfortable Head: Normal Eyes: Arcus senilis Throat: Neck: Full range of motion Lymph Nodes: No adenopathy Lungs: Clear to auscultation resonant to percussion Breasts:  Cardiac: Regular rhythm no murmur Abdominal: Soft nontender no mass no organomegaly normal bowel sounds Extremities: No edema no calf tenderness Vascular: No cyanosis  Neurologic alert and oriented Skin: No rash or ecchymosis  Labs:   Basename 05/31/11 0330 05/30/11 0259  WBC 14.8* 26.5*  HGB 7.4* 9.1*  HCT 20.9* 26.0*  PLT 189  249    Basename 05/31/11 0330 05/30/11 0925  NA 139 144  K 3.1* 3.1*  CL 108 112  CO2 22 13*  GLUCOSE 104* 131*  BUN 25* 34*  CREATININE 2.31* 3.75*  CALCIUM 8.0* 8.6      Images Studies/Results:   US Renal  05/30/2011  *RADIOLOGY REPORT*  Clinical Data: Acute renal failure.  RENAL/URINARY TRACT ULTRASOUND COMPLETE  Comparison:  Urinary tract ultrasound 02/27/2009 [facility].  Findings:  Right Kidney:  No hydronephrosis.  Mild diffuse cortical thinning. Normal parenchymal echotexture without focal parenchymal abnormality.  No shadowing calculi.  Approximately 8.7 cm in length, decreased since the prior examination where it measured 9.2 cm.  Left Kidney:  No hydronephrosis.  Well-preserved cortex.  Normal parenchymal echotexture without significant focal parenchymal abnormality. Approximate 2.5 cm simple cyst arising from the medial lower pole.  No shadowing calculi.  Approximate 10.0 cm in length, decreased since the prior examination where it measured 11.3 cm.  Bladder:  Decompressed by Foley catheter.  IMPRESSION:  1.  No evidence of hydronephrosis involving either kidney to suggest obstruction. 2.  2.5 cm simple cyst arising from the lower pole of the left kidney.  No significant focal parenchymal abnormalities involving either kidney. 3.  Interval slight decrease in size of both kidneys since the February, 2011 examination.  Original Report Authenticated By: Arnell Sieving, M.D.   Dg Abd Acute W/chest  05/29/2011  *RADIOLOGY REPORT*  Clinical Data: Abdominal pain  ACUTE ABDOMEN SERIES (ABDOMEN 2 VIEW & CHEST 1 VIEW)  Comparison: None.  Findings: Heart size upper normal limits.  Mild central vascular congestion.  No overt edema or focal consolidation.  No free intraperitoneal air.  Relative paucity of small bowel gas. Leftward curvature of the thoracolumbar junction with L1-2 fusion. Osteopenia.  IMPRESSION: Nonobstructive bowel gas pattern.  Original Report Authenticated By: Waneta Martins, M.D.     Patient Active Problem List  Diagnoses  . IgG monoclonal gammopathy  . Anemia  . Hypertension, benign essential, goal below 140/90  . Multiple myeloma without mention of remission  . CKD (chronic kidney disease), stage III  . Nausea & vomiting  . Hyponatremia  . AKI (acute kidney injury)  . Hypokalemia  . Hypomagnesemia    Assessment and Plan:  #1. Acute gastroenteritis etiology unclear To date he has not had any GI symptoms referable to his chemotherapy although his symptoms occurred coincidentally 48 hours after most recent Velcade treatment so it is still possible that the chemotherapy was the precipitating event.. No obvious bowel obstruction on admission so reason for initial elevation of his white count also remains unclear. Symptoms have resolved with conservative treatment.  #2. Acute on chronic renal insufficiency improved back to his  baseline post hydration  #3. Anemia secondary to renal insufficiency, acute illness, and multiple myeloma.  #4. IgG kappa multiple myeloma responding to current treatment as outlined above. He is currently under evaluation for consolidation chemotherapy with high-dose IV melphalan with autologous stem cell support.  I greatly appreciate the assistance from hospital medicine service. He has an appointment for his next chemotherapy in my office on Monday, June 3 and an M.D. visit with me on June 10. I reminded him of these appointments today.    Calvin Mccormick M 05/31/2011, 7:46 AM

## 2011-06-01 DIAGNOSIS — R112 Nausea with vomiting, unspecified: Secondary | ICD-10-CM

## 2011-06-01 DIAGNOSIS — N179 Acute kidney failure, unspecified: Secondary | ICD-10-CM

## 2011-06-01 DIAGNOSIS — E876 Hypokalemia: Secondary | ICD-10-CM

## 2011-06-01 DIAGNOSIS — I10 Essential (primary) hypertension: Secondary | ICD-10-CM

## 2011-06-01 LAB — CBC
HCT: 21.6 % — ABNORMAL LOW (ref 39.0–52.0)
MCH: 31.2 pg (ref 26.0–34.0)
MCHC: 34.3 g/dL (ref 30.0–36.0)
MCV: 91.1 fL (ref 78.0–100.0)
RDW: 18.1 % — ABNORMAL HIGH (ref 11.5–15.5)

## 2011-06-01 LAB — BASIC METABOLIC PANEL
BUN: 17 mg/dL (ref 6–23)
CO2: 21 mEq/L (ref 19–32)
Chloride: 108 mEq/L (ref 96–112)
Creatinine, Ser: 1.51 mg/dL — ABNORMAL HIGH (ref 0.50–1.35)
GFR calc Af Amer: 53 mL/min — ABNORMAL LOW (ref >90)
Glucose, Bld: 87 mg/dL (ref 70–99)

## 2011-06-01 MED ORDER — ENOXAPARIN SODIUM 40 MG/0.4ML ~~LOC~~ SOLN
40.0000 mg | SUBCUTANEOUS | Status: DC
  Administered 2011-06-02: 40 mg via SUBCUTANEOUS
  Filled 2011-06-01: qty 0.4

## 2011-06-01 MED ORDER — PANTOPRAZOLE SODIUM 40 MG PO TBEC
40.0000 mg | DELAYED_RELEASE_TABLET | Freq: Every day | ORAL | Status: DC
  Administered 2011-06-01 – 2011-06-02 (×2): 40 mg via ORAL
  Filled 2011-06-01 (×2): qty 1

## 2011-06-01 NOTE — Evaluation (Signed)
Physical Therapy Evaluation Patient Details Name: Calvin Mccormick MRN: [id number] DOB: [birth year] Today's Date: 06/01/2011 Time: 1430-1450 PT Time Calculation (min): 20 min  PT Assessment / Plan / Recommendation Clinical Impression  66 yo  male with recent chemotherapy for multiple myeloma who was admitted for nausea and vomiting.  He has been feeling better and is able to ambulate well with a RW for balance support. Anticipate he will benefit from the use of RW at home temporarily until strengthi increases. He will not need HHPT    PT Assessment  Patent does not need any further PT services    Follow Up Recommendations  No PT follow up    Barriers to Discharge        lEquipment Recommendations  Rolling walker with 5" wheels               Precautions / Restrictions Restrictions Weight Bearing Restrictions: No   Pertinent Vitals/Pain No c/o pain  O2 sats 98% with ambulation on      Mobility  Bed Mobility Bed Mobility: Rolling Right;Rolling Left;Supine to Sit;Sit to Supine Rolling Right: 7: Independent Rolling Left: 7: Independent Supine to Sit: 7: Independent Sit to Supine: 7: Independent Transfers Transfers: Sit to Stand;Stand to Sit Sit to Stand: 7: Independent Stand to Sit: 7: Independent Ambulation/Gait Ambulation/Gait Assistance: 6: Modified independent (Device/Increase time) Ambulation Distance (Feet): 250 Feet Assistive device: Rolling walker Ambulation/Gait Assistance Details: none Gait Pattern: Within Functional Limits Gait velocity: wfl General Gait Details: pt uses RW for generalized stability in gait.   Stairs: No Wheelchair Mobility Wheelchair Mobility: No    Exercises Other Exercises Other Exercises: pt instructed to do short frequent bursts of activity throughout the day and do episodes of standing with arm movment for a few minutes at a time to increase strength and activity tolerance      Visit Information  Last PT Received On:  06/01/11 Assistance Needed: +1    Subjective Data  Subjective: "I'll walk" Patient Stated Goal: to go home tomorrow   Prior Functioning  Home Living Lives With: Family Available Help at Discharge: Family Type of Home: House Home Access: Stairs to enter Secretary/administrator of Steps: 2 Home Layout: One level Home Adaptive Equipment: None Prior Function Level of Independence: Independent Able to Take Stairs?: Yes Communication Communication: No difficulties    Cognition  Overall Cognitive Status: Appears within functional limits for tasks assessed/performed Arousal/Alertness: Awake/alert Orientation Level: Appears intact for tasks assessed Behavior During Session: [facility] for tasks performed    Extremity/Trunk Assessment Right Lower Extremity Assessment RLE ROM/Strength/Tone: [facility] for tasks assessed RLE Coordination: WFL - gross/fine motor Left Lower Extremity Assessment LLE ROM/Strength/Tone: WFL for tasks assessed LLE Coordination: WFL - gross/fine motor Trunk Assessment Trunk Assessment: Other exceptions Trunk Exceptions: pt kyphoscoliosis with large rib hump on the left with corresponding shoulder and pelvic obliquities   Balance Balance Balance Assessed: No  End of Session PT - End of Session Equipment Utilized During Treatment: Gait belt Activity Tolerance: Patient tolerated treatment well Patient left: in bed;with family/visitor present Nurse Communication: Mobility status   Donnetta Hail 06/01/2011, 3:08 PM

## 2011-06-01 NOTE — Progress Notes (Signed)
The patient is receiving Protonix by the intravenous route.  Based on criteria approved by the Pharmacy and Therapeutics Committee and the Medical Executive Committee, the medication is being converted to the equivalent oral dose form.  These criteria include: -No Active GI bleeding -Able to tolerate diet of full liquids (or better) or tube feeding OR able to tolerate other medications by the oral or enteral route  If you have any questions about this conversion, please contact the Pharmacy Department (ext [phone]).  Thank you.  Berkley Harvey, [facility] 06/01/2011 12:29 PM

## 2011-06-01 NOTE — Progress Notes (Signed)
Subjective: Doing well, denies any complaints, no N/V, eating well Objective: Vital signs in last 24 hours: Temp:  [97.5 F (36.4 C)-99.2 F (37.3 C)] 99.2 F (37.3 C) (05/25 0445) Pulse Rate:  [39-81] 81  (05/25 0645) Resp:  [12-18] 18  (05/25 0645) BP: (108-175)/(51-74) 155/65 mmHg (05/25 0645) SpO2:  [99 %-100 %] 100 % (05/25 0645) Weight:  [72.576 kg (160 lb)] 72.576 kg (160 lb) (05/24 1749) Weight change:  Last BM Date: 05/31/11  Intake/Output from previous day: 05/24 0701 - 05/25 0700 In: 2981.3 [P.O.:120; I.V.:2861.3] Out: 500 [Urine:500]     Physical Exam: General: awake, alert, oriented x3, in no acute distress. HEENT: No bruits, no goiter, dry mucosa Heart: Regular rate and rhythm, without murmurs, rubs, gallops. Lungs: Clear to auscultation bilaterally. Abdomen: Soft, nontender, nondistended, positive bowel sounds. Extremities: No clubbing cyanosis or edema with positive pedal pulses. Neuro: Grossly intact, nonfocal.    Lab Results: Basic Metabolic Panel:  Basename 06/01/11 0400 05/31/11 0330 05/30/11 0925 05/29/11 1830  NA 139 139 -- --  K 3.3* 3.1* -- --  CL 108 108 -- --  CO2 21 22 -- --  GLUCOSE 87 104* -- --  BUN 17 25* -- --  CREATININE 1.51* 2.31* -- --  CALCIUM 8.1* 8.0* -- --  MG -- -- 1.9 1.2*  PHOS -- -- -- --   Liver Function Tests: No results found for this basename: AST:2,ALT:2,ALKPHOS:2,BILITOT:2,PROT:2,ALBUMIN:2 in the last 72 hours  Basename 05/29/11 1835  LIPASE 93*  AMYLASE --   No results found for this basename: AMMONIA:2 in the last 72 hours CBC:  Basename 06/01/11 0400 05/31/11 0330 05/29/11 1835  WBC 10.1 14.8* --  NEUTROABS -- -- 29.0*  HGB 7.4* 7.4* --  HCT 21.6* 20.9* --  MCV 91.1 90.1 --  PLT 169 189 --   Cardiac Enzymes:  Basename 05/30/11 0259  CKTOTAL 159  CKMB 4.5*  CKMBINDEX --  TROPONINI <0.30   BNP: No results found for this basename: PROBNP:3 in the last 72 hours D-Dimer: No results found for  this basename: DDIMER:2 in the last 72 hours CBG: No results found for this basename: GLUCAP:6 in the last 72 hours Hemoglobin A1C: No results found for this basename: HGBA1C in the last 72 hours Fasting Lipid Panel: No results found for this basename: CHOL,HDL,LDLCALC,TRIG,CHOLHDL,LDLDIRECT in the last 72 hours Thyroid Function Tests: No results found for this basename: TSH,T4TOTAL,FREET4,T3FREE,THYROIDAB in the last 72 hours Anemia Panel: No results found for this basename: VITAMINB12,FOLATE,FERRITIN,TIBC,IRON,RETICCTPCT in the last 72 hours Coagulation: No results found for this basename: LABPROT:2,INR:2 in the last 72 hours Urine Drug Screen: Drugs of Abuse  No results found for this basename: labopia,  cocainscrnur,  labbenz,  amphetmu,  thcu,  labbarb    Alcohol Level: No results found for this basename: ETH:2 in the last 72 hours Urinalysis:  Basename 05/30/11 0922  COLORURINE YELLOW  LABSPEC 1.019  PHURINE 5.0  GLUCOSEU NEGATIVE  HGBUR TRACE*  BILIRUBINUR NEGATIVE  KETONESUR NEGATIVE  PROTEINUR 30*  UROBILINOGEN 0.2  NITRITE NEGATIVE  LEUKOCYTESUR NEGATIVE    Recent Results (from the past 240 hour(s))  TECHNOLOGIST REVIEW     Status: Normal   Collection Time   05/27/11  1:36 PM      Component Value Range Status Comment   Technologist Review Rare metamyelocyte   Final   MRSA PCR SCREENING     Status: Normal   Collection Time   05/30/11  2:39 AM      Component  Value Range Status Comment   MRSA by PCR NEGATIVE  NEGATIVE  Final     Studies/Results: US Renal  05/30/2011  *RADIOLOGY REPORT*  Clinical Data: Acute renal failure.  RENAL/URINARY TRACT ULTRASOUND COMPLETE  Comparison:  Urinary tract ultrasound 02/27/2009 [facility].  Findings:  Right Kidney:  No hydronephrosis.  Mild diffuse cortical thinning. Normal parenchymal echotexture without focal parenchymal abnormality.  No shadowing calculi.  Approximately 8.7 cm in length, decreased since the prior  examination where it measured 9.2 cm.  Left Kidney:  No hydronephrosis.  Well-preserved cortex.  Normal parenchymal echotexture without significant focal parenchymal abnormality. Approximate 2.5 cm simple cyst arising from the medial lower pole.  No shadowing calculi. Approximate 10.0 cm in length, decreased since the prior examination where it measured 11.3 cm.  Bladder:  Decompressed by Foley catheter.  IMPRESSION:  1.  No evidence of hydronephrosis involving either kidney to suggest obstruction. 2.  2.5 cm simple cyst arising from the lower pole of the left kidney.  No significant focal parenchymal abnormalities involving either kidney. 3.  Interval slight decrease in size of both kidneys since the February, 2011 examination.  Original Report Authenticated By: Arnell Sieving, M.D.    Medications: Scheduled Meds:    . amLODipine  10 mg Oral Daily  . cloNIDine  0.2 mg Oral BID  . enoxaparin (LOVENOX) injection  30 mg Subcutaneous Q24H  . folic acid  1 mg Oral Daily  . hydrALAZINE  25 mg Oral Q8H  . pantoprazole (PROTONIX) IV  40 mg Intravenous QHS  . potassium chloride  40 mEq Oral Daily  . simvastatin  20 mg Oral Daily  . sodium chloride  3 mL Intravenous Q12H  . valACYclovir  500 mg Oral Daily  . DISCONTD: dexamethasone  20 mg Oral Q7 days  . DISCONTD: enoxaparin (LOVENOX) injection  40 mg Subcutaneous Q24H  . DISCONTD: heparin  5,000 Units Subcutaneous Q8H   Continuous Infusions:    . sodium chloride 75 mL/hr at 06/01/11 0216  . DISCONTD: dextrose 5 % 1,000 mL with potassium chloride 20 mEq, sodium bicarbonate 100 mEq infusion 100 mL/hr at 05/31/11 0651   PRN Meds:.acetaminophen, acetaminophen, hydrALAZINE, ondansetron (ZOFRAN) IV, ondansetron, polyvinyl alcohol, promethazine  Assessment/Plan: 1. Nausea & vomiting: likely chemo related supportive care, anti-emetics, resolved, stop IVF 2. ARF with metabolic acidosis: multifactorial, ACE/NSAIDs, chemotherapy and volume depletion  from N/V/Poor PO intake Baseline creatinine 1.7 in 4/13 Improved back to baseline,  DC IVF, DC foley Renal USG without obstruction Renal consulted Dr. Bascom Levels on admission who is also his PCP 3. Multiple myeloma: on active chemo, last on Monday Appreciate Dr.Granfortuna's input 4. Hypokalemia: replace 5. Uncontrolled HTN: improved, continue Clonidine, Hydralazine/Amlidipine 6. Leukocytosis: improving,could be stress induced, afebrile with no clear source of infection at this point, CXR/KuB/UA unremarkable, continue to monitor 7. Anemia: CKD, Myeloma, chemo: drop in Hb, worsened by hemodilution now, will transfuse 1unit PRBC today before DC 8.DVT prophylaxis: Lovenox PT eval Ambulate Dispo: home tomorrow if stable    LOS: 3 days   Labrittany Wechter [facility] Pager: [phone] 06/01/2011, 7:29 AM

## 2011-06-02 LAB — CBC
MCH: 30.2 pg (ref 26.0–34.0)
MCHC: 33.7 g/dL (ref 30.0–36.0)
MCV: 89.7 fL (ref 78.0–100.0)
Platelets: 178 10*3/uL (ref 150–400)

## 2011-06-02 LAB — TYPE AND SCREEN: Unit division: 0

## 2011-06-02 LAB — BASIC METABOLIC PANEL
Calcium: 8 mg/dL — ABNORMAL LOW (ref 8.4–10.5)
Creatinine, Ser: 1.37 mg/dL — ABNORMAL HIGH (ref 0.50–1.35)
GFR calc non Af Amer: 51 mL/min — ABNORMAL LOW (ref >90)
Sodium: 139 mEq/L (ref 135–145)

## 2011-06-02 MED ORDER — AMLODIPINE BESYLATE 10 MG PO TABS: 10.0000 mg | ORAL_TABLET | Freq: Every day | ORAL | Status: DC

## 2011-06-02 NOTE — Plan of Care (Signed)
Problem: Phase III Progression Outcomes Goal: Voiding independently Outcome: Adequate for Discharge Using urinal

## 2011-06-02 NOTE — Progress Notes (Signed)
Pt. Sitting up in bed eating breakfast. Denies any nausea or vomiting. Family member stayed the night. Appetite fair for breakfast. Pt. States he is going home today, waiting to be discharged.

## 2011-06-02 NOTE — Plan of Care (Signed)
Problem: Phase III Progression Outcomes Goal: Discharge plan remains appropriate-arrangements made Outcome: Completed/Met Date Met:  06/02/11 Pt. Could not wait for walker to be delivered to the hospital. It will be delivered to his home. Pt. Discharged to family. Ambulating well without problem. D/c instructions explained to pt and his wife, state they have no questions.

## 2011-06-02 NOTE — Progress Notes (Addendum)
CM spoke with pt concerning CM consult for DME. PT recommends RW.  CM  [facility] at [phone] contacted concerning dme delivery scheduled to residence 06/04/11. MD order faxed to [facility] at [phone].    Calvin Mccormick [phone]

## 2011-06-04 ENCOUNTER — Other Ambulatory Visit: Payer: Self-pay

## 2011-06-04 MED ORDER — LENALIDOMIDE 10 MG PO CAPS: 10.0000 mg | ORAL_CAPSULE | Freq: Every day | ORAL | Status: DC

## 2011-06-04 NOTE — Progress Notes (Signed)
Discharge summary sent to payer through [facility]  

## 2011-06-07 ENCOUNTER — Emergency Department (HOSPITAL_COMMUNITY)
Admission: EM | Admit: 2011-06-07 | Discharge: 2011-06-08 | Disposition: A | Discharge status: Home / Self Care | Payer: Medicare PPO | Attending: Emergency Medicine | Admitting: Emergency Medicine

## 2011-06-07 ENCOUNTER — Encounter (HOSPITAL_COMMUNITY): Payer: Self-pay | Admitting: Emergency Medicine

## 2011-06-07 DIAGNOSIS — I10 Essential (primary) hypertension: Secondary | ICD-10-CM | POA: Insufficient documentation

## 2011-06-07 DIAGNOSIS — C9000 Multiple myeloma not having achieved remission: Secondary | ICD-10-CM | POA: Insufficient documentation

## 2011-06-07 DIAGNOSIS — M7989 Other specified soft tissue disorders: Secondary | ICD-10-CM | POA: Insufficient documentation

## 2011-06-07 DIAGNOSIS — M542 Cervicalgia: Secondary | ICD-10-CM | POA: Insufficient documentation

## 2011-06-07 DIAGNOSIS — R52 Pain, unspecified: Secondary | ICD-10-CM

## 2011-06-07 DIAGNOSIS — Z79899 Other long term (current) drug therapy: Secondary | ICD-10-CM | POA: Insufficient documentation

## 2011-06-07 MED ORDER — HYDROMORPHONE HCL PF 1 MG/ML IJ SOLN
1.0000 mg | Freq: Once | INTRAMUSCULAR | Status: AC
  Administered 2011-06-08: 1 mg via INTRAVENOUS
  Filled 2011-06-07: qty 1

## 2011-06-07 MED ORDER — ONDANSETRON HCL 4 MG/2ML IJ SOLN
4.0000 mg | Freq: Once | INTRAMUSCULAR | Status: AC
  Administered 2011-06-08: 4 mg via INTRAVENOUS
  Filled 2011-06-07: qty 2

## 2011-06-07 MED ORDER — SODIUM CHLORIDE 0.9 % IV SOLN
Freq: Once | INTRAVENOUS | Status: AC
  Administered 2011-06-08: via INTRAVENOUS

## 2011-06-07 NOTE — ED Notes (Signed)
Pt alert, arrives from home, c/o swelling to lower ext, neck pain, recently seen and discharge from [facility], returns with cont c/o, resp even unlabored, skin pwd

## 2011-06-07 NOTE — ED Provider Notes (Addendum)
History     CSN: [id number]  Arrival date & time 06/07/11  2127   First MD Initiated Contact with Patient 06/07/11 2336      Chief Complaint  Patient presents with  . Leg Swelling  . Neck Pain    (Consider location/radiation/quality/duration/timing/severity/associated sxs/prior treatment) HPI Comments: Patient with a history of multiple myeloma.  That has not achieved remission as of yet currently receiving chemotherapy on a weekly basis, was recently discharged from the hospital, but returns tonight with continued pain and weakness.  He cannot ambulate due to the extreme pain and swelling in his feet, and ankles.  He states none of the pain that he is experiencing is new.  It is chronic, and he has only been taking Tylenol at home  Patient is a 66 y.o. male presenting with neck pain. The history is provided by the patient and the spouse.  Neck Pain  This is a chronic problem. The current episode started more than 1 week ago. The problem occurs constantly. The problem has not changed since onset.Associated symptoms include weakness. Pertinent negatives include no numbness.    Past Medical History  Diagnosis Date  . Anemia 11/27/2010  . Hypertension, benign essential, goal below 140/90 11/27/2010  . Multiple myeloma without mention of remission 12/12/2010  . Multiple myeloma without mention of remission 12/12/2010  . CKD (chronic kidney disease), stage III 02/05/2011  . Hyperlipemia     History reviewed. No pertinent past surgical history.  No family history on file.  History  Substance Use Topics  . Smoking status: Never Smoker   . Smokeless tobacco: Never Used  . Alcohol Use: No      Review of Systems  Constitutional: Negative for fever and chills.  HENT: Positive for neck pain. Negative for trouble swallowing.   Genitourinary: Negative for dysuria.  Musculoskeletal: Positive for back pain, joint swelling, arthralgias and gait problem.  Skin: Negative for wound.    Neurological: Positive for weakness. Negative for dizziness and numbness.    Allergies  Lactose intolerance (gi)  Home Medications   Current Outpatient Rx  Name Route Sig Dispense Refill  . AMLODIPINE BESYLATE 10 MG PO TABS Oral Take 1 tablet (10 mg total) by mouth daily. 30 tablet 0  . ATORVASTATIN CALCIUM 20 MG PO TABS Oral Take 20 mg by mouth at bedtime.     Marland Kitchen DEXAMETHASONE 4 MG PO TABS Oral Take 20 mg by mouth every 7 (seven) days. On Mondays.    Marland Kitchen ESOMEPRAZOLE MAGNESIUM 40 MG PO CPDR Oral Take 40 mg by mouth daily before breakfast.      . FOLIC ACID 1 MG PO TABS Oral Take 1 mg by mouth daily.      Marland Kitchen LENALIDOMIDE 10 MG PO CAPS Oral Take 1 capsule (10 mg total) by mouth daily. 1 po daily x 14 days, then 7 day break.   AUTH # [id number] Faxed to [facility] [phone] 14 capsule 0    This was faxed to [facility].  . ONDANSETRON 8 MG PO TBDP Oral Take 8 mg by mouth every 8 (eight) hours as needed. For nausea.    Marland Kitchen POLYVINYL ALCOHOL 1.4 % OP SOLN Both Eyes Place 1 drop into both eyes as needed. For dry eyes.    Marland Kitchen PRESCRIPTION MEDICATION Intravenous Inject into the vein every 7 (seven) days. Velcade injections.  His oncologist is Dr. Okey Dupre. He receives his treatments on Mondays.    Marland Kitchen SILDENAFIL CITRATE 50 MG PO TABS  Oral Take 50 mg by mouth daily as needed. For erectile dysfunction.    . TRAMADOL HCL 50 MG PO TABS Oral Take 50 mg by mouth every 6 (six) hours as needed. For pain. Maximum dose= 8 tablets per day.    Marland Kitchen VALACYCLOVIR HCL 500 MG PO TABS  TAKE 1 TABLET BY MOUTH EVERY DAY TO PREVENT VIRUS INFECTION 30 tablet 0  . VITAMIN E 400 UNITS PO CAPS Oral Take 400 Units by mouth daily.        BP 145/81  Pulse 85  Temp 98 F (36.7 C)  Resp 16  SpO2 97%  Physical Exam  Constitutional: He appears well-developed and well-nourished.  HENT:  Head: Normocephalic.  Eyes: Pupils are equal, round, and reactive to light.  Neck: Normal range of motion.  Cardiovascular:  Normal rate and regular rhythm.   Pulmonary/Chest: Effort normal.  Abdominal: Soft. He exhibits no distension. There is no tenderness.  Musculoskeletal: He exhibits edema and tenderness.  Neurological: He is alert.  Skin: Skin is warm. No rash noted.    ED Course  Procedures (including critical care time)   Labs Reviewed  CBC  COMPREHENSIVE METABOLIC PANEL  URINALYSIS, ROUTINE W REFLEX MICROSCOPIC   No results found.   No diagnosis found. Patient is feeling much better.  Warm compresses have been applied to his neck.  Area, as well as being provided with 2 mg of Dilaudid   MDM   Pain with mets of myeloma        Arman Filter, NP 06/08/11 0442  Arman Filter, NP 06/08/11 [phone]

## 2011-06-08 ENCOUNTER — Other Ambulatory Visit: Payer: Self-pay | Admitting: Nephrology

## 2011-06-08 ENCOUNTER — Emergency Department (HOSPITAL_COMMUNITY): Payer: Medicare PPO

## 2011-06-08 LAB — CBC
HCT: 27.2 % — ABNORMAL LOW (ref 39.0–52.0)
MCH: 31.4 pg (ref 26.0–34.0)
MCV: 88.9 fL (ref 78.0–100.0)
Platelets: 370 10*3/uL (ref 150–400)
RBC: 3.06 MIL/uL — ABNORMAL LOW (ref 4.22–5.81)
WBC: 14.5 10*3/uL — ABNORMAL HIGH (ref 4.0–10.5)

## 2011-06-08 LAB — COMPREHENSIVE METABOLIC PANEL
BUN: 15 mg/dL (ref 6–23)
CO2: 20 mEq/L (ref 19–32)
Calcium: 9.3 mg/dL (ref 8.4–10.5)
Chloride: 95 mEq/L — ABNORMAL LOW (ref 96–112)
Creatinine, Ser: 1.34 mg/dL (ref 0.50–1.35)
GFR calc Af Amer: 61 mL/min — ABNORMAL LOW (ref >90)
GFR calc non Af Amer: 53 mL/min — ABNORMAL LOW (ref >90)
Total Bilirubin: 0.5 mg/dL (ref 0.3–1.2)

## 2011-06-08 MED ORDER — HYDROMORPHONE HCL PF 1 MG/ML IJ SOLN
1.0000 mg | Freq: Once | INTRAMUSCULAR | Status: AC
  Administered 2011-06-08: 1 mg via INTRAVENOUS
  Filled 2011-06-08: qty 1

## 2011-06-08 MED ORDER — OXYCODONE-ACETAMINOPHEN 5-325 MG PO TABS: 1.0000 | ORAL_TABLET | ORAL | Status: AC | PRN

## 2011-06-08 NOTE — ED Provider Notes (Signed)
See prior note Devoria Albe, MD, Franz Dell, MD 06/08/11 [phone]

## 2011-06-08 NOTE — Discharge Instructions (Signed)
Please call Dr. for his office for further evaluation of your medical condition, you had been given a prescription for Percocet that he can use one tablet every 4-6 hours as needed.  For pain

## 2011-06-08 NOTE — ED Notes (Signed)
Told NP that the pt had not urinated yet.  Ask NP if she would like to have an in and out cath performed.  NP stated not at this time.

## 2011-06-08 NOTE — ED Provider Notes (Signed)
Patient has history of multiple myeloma and presents emergency department for bony pain. He has only been placed on tramadol for pain. Patient denies any other acute symptoms.  Patient sleeping in no distress.  Medical screening examination/treatment/procedure(s) were conducted as a shared visit with non-physician practitioner(s) and myself.  I personally evaluated the patient during the encounter Devoria Albe, MD, Franz Dell, MD 06/08/11 [phone]

## 2011-06-08 NOTE — ED Notes (Signed)
Pt states that he cannot urinate.  Stated that he would try again.

## 2011-06-10 ENCOUNTER — Other Ambulatory Visit: Payer: Medicare PPO

## 2011-06-10 ENCOUNTER — Ambulatory Visit: Payer: Medicare PPO

## 2011-06-10 ENCOUNTER — Telehealth: Payer: Self-pay | Admitting: *Deleted

## 2011-06-10 NOTE — Telephone Encounter (Signed)
Call from chemo nurse reporting pt did not show up for tx. Called pt he stated he "overlooked appt" for chemo today. Pt reports he was told to discontinue Revlimid. Takes #5 (20 mg) Decadron tablets once weekly on Velcade treatment days.   He states he was scheduled to go to [facility] on 6/4 but they called him and canceled. Reviewed with Misty Stanley, pt will continue Velcade. Needs to be rescheduled.

## 2011-06-10 NOTE — Progress Notes (Signed)
Patient did not come in for infusion appointment today; patient states that he originally had a appointment for 06/13/11; spoke with desk nurse about situation.

## 2011-06-11 ENCOUNTER — Other Ambulatory Visit: Payer: Self-pay | Admitting: Oncology

## 2011-06-11 ENCOUNTER — Other Ambulatory Visit (HOSPITAL_BASED_OUTPATIENT_CLINIC_OR_DEPARTMENT_OTHER): Payer: Medicare PPO

## 2011-06-11 ENCOUNTER — Ambulatory Visit (HOSPITAL_BASED_OUTPATIENT_CLINIC_OR_DEPARTMENT_OTHER): Payer: Medicare PPO

## 2011-06-11 ENCOUNTER — Other Ambulatory Visit: Payer: Self-pay | Admitting: *Deleted

## 2011-06-11 VITALS — BP 151/71 | HR 89 | Temp 98.7°F

## 2011-06-11 DIAGNOSIS — C9000 Multiple myeloma not having achieved remission: Secondary | ICD-10-CM

## 2011-06-11 DIAGNOSIS — D649 Anemia, unspecified: Secondary | ICD-10-CM

## 2011-06-11 DIAGNOSIS — D472 Monoclonal gammopathy: Secondary | ICD-10-CM

## 2011-06-11 DIAGNOSIS — Z5112 Encounter for antineoplastic immunotherapy: Secondary | ICD-10-CM

## 2011-06-11 DIAGNOSIS — R110 Nausea: Secondary | ICD-10-CM

## 2011-06-11 LAB — CBC WITH DIFFERENTIAL/PLATELET
Basophils Absolute: 0 10*3/uL (ref 0.0–0.1)
Eosinophils Absolute: 0 10*3/uL (ref 0.0–0.5)
HGB: 9 g/dL — ABNORMAL LOW (ref 13.0–17.1)
MCV: 87.6 fL (ref 79.3–98.0)
MONO#: 0.7 10*3/uL (ref 0.1–0.9)
MONO%: 6.2 % (ref 0.0–14.0)
NEUT#: 9 10*3/uL — ABNORMAL HIGH (ref 1.5–6.5)
RBC: 2.9 10*6/uL — ABNORMAL LOW (ref 4.20–5.82)
RDW: 16.3 % — ABNORMAL HIGH (ref 11.0–14.6)
WBC: 11.1 10*3/uL — ABNORMAL HIGH (ref 4.0–10.3)
lymph#: 1.3 10*3/uL (ref 0.9–3.3)
nRBC: 0 % (ref 0–0)

## 2011-06-11 MED ORDER — BORTEZOMIB CHEMO SQ INJECTION 3.5 MG (2.5MG/ML)
1.3000 mg/m2 | Freq: Once | INTRAMUSCULAR | Status: AC
  Administered 2011-06-11: 2.75 mg via SUBCUTANEOUS
  Filled 2011-06-11: qty 2.75

## 2011-06-11 MED ORDER — ONDANSETRON HCL 8 MG PO TABS
8.0000 mg | ORAL_TABLET | Freq: Once | ORAL | Status: AC
  Administered 2011-06-11: 8 mg via ORAL

## 2011-06-11 MED ORDER — VALACYCLOVIR HCL 500 MG PO TABS: 500.0000 mg | ORAL_TABLET | Freq: Once | ORAL | Status: DC

## 2011-06-11 MED ORDER — ONDANSETRON 8 MG PO TBDP: 8.0000 mg | ORAL_TABLET | Freq: Three times a day (TID) | ORAL | Status: DC | PRN

## 2011-06-11 NOTE — Patient Instructions (Signed)
[facility] Discharge Instructions for Patients Receiving Chemotherapy  Today you received the following chemotherapy agents:  Velcade  To help prevent nausea and vomiting after your treatment, we encourage you to take your nausea medication as ordered per MD.    If you develop nausea and vomiting that is not controlled by your nausea medication, call the clinic. If it is after clinic hours your family physician or the after hours number for the clinic or go to the Emergency Department.   BELOW ARE SYMPTOMS THAT SHOULD BE REPORTED IMMEDIATELY:  *FEVER GREATER THAN 100.5 F  *CHILLS WITH OR WITHOUT FEVER  NAUSEA AND VOMITING THAT IS NOT CONTROLLED WITH YOUR NAUSEA MEDICATION  *UNUSUAL SHORTNESS OF BREATH  *UNUSUAL BRUISING OR BLEEDING  TENDERNESS IN MOUTH AND THROAT WITH OR WITHOUT PRESENCE OF ULCERS  *URINARY PROBLEMS  *BOWEL PROBLEMS  UNUSUAL RASH Items with * indicate a potential emergency and should be followed up as soon as possible.   Please let the nurse know about any problems that you may have experienced. Feel free to call the clinic you have any questions or concerns. The clinic phone number is [phone].   I have been informed and understand all the instructions given to me. I know to contact the clinic, my physician, or go to the Emergency Department if any problems should occur. I do not have any questions at this time, but understand that I may call the clinic during office hours   should I have any questions or need assistance in obtaining follow up care.    __________________________________________  _____________  __________ Signature of Patient or Authorized Representative            Date                   Time    __________________________________________ Nurse's Signature    

## 2011-06-13 ENCOUNTER — Other Ambulatory Visit: Payer: Self-pay | Admitting: Oncology

## 2011-06-17 ENCOUNTER — Other Ambulatory Visit (HOSPITAL_BASED_OUTPATIENT_CLINIC_OR_DEPARTMENT_OTHER): Payer: Medicare PPO | Admitting: Lab

## 2011-06-17 ENCOUNTER — Ambulatory Visit (HOSPITAL_BASED_OUTPATIENT_CLINIC_OR_DEPARTMENT_OTHER): Payer: Medicare PPO | Admitting: Oncology

## 2011-06-17 ENCOUNTER — Telehealth: Payer: Self-pay | Admitting: Oncology

## 2011-06-17 ENCOUNTER — Encounter: Payer: Self-pay | Admitting: Oncology

## 2011-06-17 ENCOUNTER — Telehealth: Payer: Self-pay | Admitting: *Deleted

## 2011-06-17 ENCOUNTER — Ambulatory Visit (HOSPITAL_BASED_OUTPATIENT_CLINIC_OR_DEPARTMENT_OTHER): Payer: Medicare PPO

## 2011-06-17 VITALS — BP 150/86 | HR 81 | Temp 97.0°F | Ht 71.0 in | Wt 154.9 lb

## 2011-06-17 DIAGNOSIS — C9000 Multiple myeloma not having achieved remission: Secondary | ICD-10-CM

## 2011-06-17 DIAGNOSIS — N2889 Other specified disorders of kidney and ureter: Secondary | ICD-10-CM | POA: Insufficient documentation

## 2011-06-17 DIAGNOSIS — Z5112 Encounter for antineoplastic immunotherapy: Secondary | ICD-10-CM

## 2011-06-17 DIAGNOSIS — N183 Chronic kidney disease, stage 3 unspecified: Secondary | ICD-10-CM

## 2011-06-17 DIAGNOSIS — R197 Diarrhea, unspecified: Secondary | ICD-10-CM

## 2011-06-17 DIAGNOSIS — N289 Disorder of kidney and ureter, unspecified: Secondary | ICD-10-CM

## 2011-06-17 DIAGNOSIS — D472 Monoclonal gammopathy: Secondary | ICD-10-CM

## 2011-06-17 DIAGNOSIS — D649 Anemia, unspecified: Secondary | ICD-10-CM

## 2011-06-17 HISTORY — DX: Diarrhea, unspecified: R19.7

## 2011-06-17 HISTORY — DX: Chronic kidney disease, stage 3 unspecified: N18.30

## 2011-06-17 LAB — CBC WITH DIFFERENTIAL/PLATELET
BASO%: 0.2 % (ref 0.0–2.0)
Eosinophils Absolute: 0 10*3/uL (ref 0.0–0.5)
HCT: 28.8 % — ABNORMAL LOW (ref 38.4–49.9)
MCHC: 34.2 g/dL (ref 32.0–36.0)
MONO#: 0.6 10*3/uL (ref 0.1–0.9)
NEUT#: 10.5 10*3/uL — ABNORMAL HIGH (ref 1.5–6.5)
NEUT%: 85.9 % — ABNORMAL HIGH (ref 39.0–75.0)
Platelets: 345 10*3/uL (ref 140–400)
WBC: 12.2 10*3/uL — ABNORMAL HIGH (ref 4.0–10.3)
lymph#: 1 10*3/uL (ref 0.9–3.3)

## 2011-06-17 MED ORDER — ONDANSETRON HCL 8 MG PO TABS
8.0000 mg | ORAL_TABLET | Freq: Once | ORAL | Status: AC
  Administered 2011-06-17: 8 mg via ORAL

## 2011-06-17 MED ORDER — BORTEZOMIB CHEMO SQ INJECTION 3.5 MG (2.5MG/ML)
1.0000 mg/m2 | Freq: Once | INTRAMUSCULAR | Status: AC
  Administered 2011-06-17: 2 mg via SUBCUTANEOUS
  Filled 2011-06-17: qty 2

## 2011-06-17 MED ORDER — VALACYCLOVIR HCL 500 MG PO TABS: 500.0000 mg | ORAL_TABLET | Freq: Once | ORAL | Status: DC

## 2011-06-17 NOTE — Patient Instructions (Signed)
[facility] Discharge Instructions for Patients Receiving Chemotherapy  Today you received the following chemotherapy agents Velcade.  To help prevent nausea and vomiting after your treatment, we encourage you to take your nausea medication.   If you develop nausea and vomiting that is not controlled by your nausea medication, call the clinic. If it is after clinic hours your family physician or the after hours number for the clinic or go to the Emergency Department.   BELOW ARE SYMPTOMS THAT SHOULD BE REPORTED IMMEDIATELY:  *FEVER GREATER THAN 100.5 F  *CHILLS WITH OR WITHOUT FEVER  NAUSEA AND VOMITING THAT IS NOT CONTROLLED WITH YOUR NAUSEA MEDICATION  *UNUSUAL SHORTNESS OF BREATH  *UNUSUAL BRUISING OR BLEEDING  TENDERNESS IN MOUTH AND THROAT WITH OR WITHOUT PRESENCE OF ULCERS  *URINARY PROBLEMS  *BOWEL PROBLEMS  UNUSUAL RASH Items with * indicate a potential emergency and should be followed up as soon as possible.  One of the nurses will contact you 24 hours after your treatment. Please let the nurse know about any problems that you may have experienced. Feel free to call the clinic you have any questions or concerns. The clinic phone number is [phone].   I have been informed and understand all the instructions given to me. I know to contact the clinic, my physician, or go to the Emergency Department if any problems should occur. I do not have any questions at this time, but understand that I may call the clinic during office hours   should I have any questions or need assistance in obtaining follow up care.    __________________________________________  _____________  __________ Signature of Patient or Authorized Representative            Date                   Time    __________________________________________ Nurse's Signature    

## 2011-06-17 NOTE — Progress Notes (Signed)
Hematology and Oncology Follow Up Visit  Calvin Mccormick [id number] Nov 18, 1945 66 y.o. 06/17/2011 6:03 PM   Principle Diagnosis: Encounter Diagnoses  Name Primary?  . Multiple myeloma without mention of remission Yes  . Diarrhea      Interim History:  Followup visit for this 66 year old man under active treatment for IgG kappa multiple myeloma diagnosed in November 2012 when he presented with progressive anemia and renal insufficiency. He was started on a program of chemotherapy with subcutaneous Velcade, oral Revlimid, and oral dexamethasone. He has been somewhat difficult to communicate with him and I am not sure that he is taking the medication the way it is being prescribed. Nevertheless, it appears that he is responding to treatments. His pretreatment IgG has fallen from initial value of 3360 mg percent 2 most recent value of 605 mg percent as of 05/13/2011. His hemoglobin has come up from 8 g to 10.5 g. His renal function also appears to be improving and his creatinine is down from a pretreatment value of 2.3 back in November 2012 2 most recent value of 1.5 today. Value of 1.3 recorded on May 31. Peak creatinines over the last 6 months have reached as high as 4.6 on May 22.  He had interim hospitalization on May 23 for acute onset of nausea, vomiting, and diarrhea and while in the hospital also developed urinary retention and required placement of a Foley catheter. It was not clear what the etiology of his symptoms was. It did happen 2 days after most recent Velcade injection although he had previous Velcade without any GI side effects. He was hydrated. The symptoms subsided and he was discharged. He reports that he is still having loose bowel movements up to 2 times a day. He is having anorexia. Generalized weakness. Some difficulty ambulating due to weakness. He has not had any fever or infection since hospital discharge.   Medications: reviewed  Allergies:  Allergies  Allergen Reactions    . Lactose Intolerance (Gi) Nausea And Vomiting    Review of Systems: Constitutional:   See above Respiratory: No cough or dyspnea Cardiovascular:  No chest pain or palpitations Gastrointestinal: See above Genito-Urinary: See above Musculoskeletal: No muscle or bone pain Neurologic: No headache or change in vision Skin: No rash Remaining ROS negative.  Physical Exam: Blood pressure 150/86, pulse 81, temperature 97 F (36.1 C), temperature source Oral, height 5\' 11"  (1.803 m), weight 154 lb 14.4 oz (70.262 kg). Wt Readings from Last 3 Encounters:  06/17/11 154 lb 14.4 oz (70.262 kg)  05/31/11 160 lb (72.576 kg)  05/13/11 185 lb 4.8 oz (84.052 kg)     General appearance: Adequately nourished African American man. HENNT:  Abrasion on his forehead where he fell in his bathroom Lymph nodes: No adenopathy Breasts: Lungs: Clear to auscultation resonant to percussion Heart: Regular rhythm no murmur Abdomen: Soft nontender no mass no organomegaly, bowel sounds present Extremities: No edema no calf tenderness Vascular: No cyanosis Neurologic: Mental status-higher functions appear compromise. He has the same questions over and over again and doesn't remember the answer. Motor strength is 5 over 5. Reflexes 1+ symmetric. Skin: Abrasion on his forehead and his nose from recent fall  Lab Results: Lab Results  Component Value Date   WBC 12.2* 06/17/2011   HGB 9.9* 06/17/2011   HCT 28.8* 06/17/2011   MCV 92.1 06/17/2011   PLT 345 06/17/2011     Chemistry      Component Value Date/Time   NA 138 06/17/2011 1018  K 2.9* 06/17/2011 1018   CL 107 06/17/2011 1018   CO2 17* 06/17/2011 1018   BUN 24* 06/17/2011 1018   CREATININE 1.50* 06/17/2011 1018   CREATININE 2.03* 12/14/2010 1114      Component Value Date/Time   CALCIUM 8.9 06/17/2011 1018   ALKPHOS 121* 06/17/2011 1018   AST 32 06/17/2011 1018   ALT 52 06/17/2011 1018   BILITOT 0.4 06/17/2011 1018      Impression and Plan: #1. IgG  kappa multiple myeloma. He appears to be responding to current therapy. I reviewed again with him and his wife the treatment plan. I wrote everything down for them again. He brought his pill bottles with him and I reviewed them to make sure he was taking things the way they are prescribed. He is supposed to be taking Velcade injections weekly x3 with a 1 week rest, Decadron 40 mg weekly but he is only taking 20, and Revlimid 10 mg daily 2 weeks on one week rest and I don't think that he is taking this as prescribed either.  When I saw him in the hospital he told me that he had an initial consultation at [facility] in [place] but that his insurance would not cover him for high-dose chemotherapy with autologous stem cell support. However today he brought me a schedule from [facility] which outlines a complete restaging evaluation in anticipation of the high-dose chemotherapy program. I will need to call the transplant coordinator to confirm that he has been accepted in their program. He is due there this Friday, June 14 for the restaging evaluation.  #2. Unexplained diarrhea. This may be related to the Velcade. I'm going to decrease his dose from 1.3 down to 1 mg per meter squared and see if this makes a difference. I told him it is okay to use Imodium right ear over-the-counter.  #3. Chronic renal insufficiency. I believe some of this is due to chronic hypertension but certainly there is a component due to the multiple myeloma which appears to be improving on treatment.  #4. 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 See discussion above.   CC:. Dr. Earley Abide; Dr. Marlaine Hind at [facility] transplant program   Levert Feinstein, MD 6/10/20136:03 PM

## 2011-06-17 NOTE — Telephone Encounter (Signed)
Gave pt appt calendar for June and July 2013 lab , chemo and MD

## 2011-06-17 NOTE — Telephone Encounter (Signed)
Per staff messge from [facility], I have scheduled treatment appts. JMW

## 2011-06-18 ENCOUNTER — Other Ambulatory Visit: Payer: Self-pay | Admitting: *Deleted

## 2011-06-18 DIAGNOSIS — E876 Hypokalemia: Secondary | ICD-10-CM

## 2011-06-18 LAB — COMPREHENSIVE METABOLIC PANEL
ALT: 52 U/L (ref 0–53)
CO2: 17 mEq/L — ABNORMAL LOW (ref 19–32)
Calcium: 8.9 mg/dL (ref 8.4–10.5)
Chloride: 107 mEq/L (ref 96–112)
Creatinine, Ser: 1.5 mg/dL — ABNORMAL HIGH (ref 0.50–1.35)
Glucose, Bld: 152 mg/dL — ABNORMAL HIGH (ref 70–99)
Sodium: 138 mEq/L (ref 135–145)
Total Protein: 5.5 g/dL — ABNORMAL LOW (ref 6.0–8.3)

## 2011-06-18 LAB — KAPPA/LAMBDA LIGHT CHAINS: Kappa free light chain: 0.91 mg/dL (ref 0.33–1.94)

## 2011-06-18 LAB — LACTATE DEHYDROGENASE: LDH: 187 U/L (ref 94–250)

## 2011-06-18 MED ORDER — POTASSIUM CHLORIDE CRYS ER 20 MEQ PO TBCR: EXTENDED_RELEASE_TABLET | ORAL | Status: DC

## 2011-06-18 NOTE — Telephone Encounter (Signed)
Pt. notified that his K+ is low & will call in K+ for him to take x 2 days only.  Pt. never received his revlimid order from 5/28.  Called [facility] & they didn't send at first b/c he was in the hospital & then he refused shipment after that.  Informed to fill script & they will make arrangements with pt.  Pt reported that they did call & revlimid will be there in the am.  He will start revlimid tomorrow 05/19/11.  We went over his meds again:  revlimid10 mg daily x 2 wks then 1 wk break, dexamethasone 40 mg weekly, valtrex 500 mg daily, velcade SQ weekly 3wks on 1 week break.  He is having a hard time keeping up with his meds.  Will verify appt dates with Dr. Cyndie Chime.

## 2011-06-18 NOTE — Telephone Encounter (Signed)
Message copied by Sabino Snipes on Tue Jun 18, 2011  4:52 PM ------      Message from: Levert Feinstein      Created: Tue Jun 18, 2011 12:10 PM       Call pt: K low  2.9 from recent diarrhea.  Rx KCL 20 meq PO TID for 2 days   repeat BMET when he comes in for velcade next Mon

## 2011-06-19 ENCOUNTER — Telehealth: Payer: Self-pay | Admitting: Medical Oncology

## 2011-06-19 NOTE — Telephone Encounter (Signed)
revlimid to be delivered 06/19/11

## 2011-06-20 ENCOUNTER — Other Ambulatory Visit: Payer: Self-pay | Admitting: Oncology

## 2011-06-20 ENCOUNTER — Telehealth: Payer: Self-pay | Admitting: *Deleted

## 2011-06-20 NOTE — Telephone Encounter (Signed)
9 pages including last 5/6 & 05/31/11 OV & recent labs faxed to [facility] @ [phone].

## 2011-06-20 NOTE — Telephone Encounter (Signed)
Called pt regarding chemo advised to get appt calendar

## 2011-06-21 ENCOUNTER — Emergency Department (HOSPITAL_BASED_OUTPATIENT_CLINIC_OR_DEPARTMENT_OTHER): Payer: No Typology Code available for payment source

## 2011-06-21 ENCOUNTER — Encounter (HOSPITAL_BASED_OUTPATIENT_CLINIC_OR_DEPARTMENT_OTHER): Payer: Self-pay

## 2011-06-21 ENCOUNTER — Telehealth: Payer: Self-pay | Admitting: *Deleted

## 2011-06-21 ENCOUNTER — Emergency Department (HOSPITAL_BASED_OUTPATIENT_CLINIC_OR_DEPARTMENT_OTHER)
Admission: EM | Admit: 2011-06-21 | Discharge: 2011-06-21 | Disposition: A | Discharge status: Home / Self Care | Payer: No Typology Code available for payment source | Attending: Emergency Medicine | Admitting: Emergency Medicine

## 2011-06-21 DIAGNOSIS — S139XXA Sprain of joints and ligaments of unspecified parts of neck, initial encounter: Secondary | ICD-10-CM | POA: Insufficient documentation

## 2011-06-21 DIAGNOSIS — N183 Chronic kidney disease, stage 3 unspecified: Secondary | ICD-10-CM | POA: Insufficient documentation

## 2011-06-21 DIAGNOSIS — C9001 Multiple myeloma in remission: Secondary | ICD-10-CM | POA: Insufficient documentation

## 2011-06-21 DIAGNOSIS — S161XXA Strain of muscle, fascia and tendon at neck level, initial encounter: Secondary | ICD-10-CM

## 2011-06-21 DIAGNOSIS — M549 Dorsalgia, unspecified: Secondary | ICD-10-CM | POA: Insufficient documentation

## 2011-06-21 DIAGNOSIS — I129 Hypertensive chronic kidney disease with stage 1 through stage 4 chronic kidney disease, or unspecified chronic kidney disease: Secondary | ICD-10-CM | POA: Insufficient documentation

## 2011-06-21 MED ORDER — HYDROCODONE-ACETAMINOPHEN 5-325 MG PO TABS: 2.0000 | ORAL_TABLET | ORAL | Status: AC | PRN
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
## 2011-06-21 NOTE — ED Notes (Signed)
Family at bedside. 

## 2011-06-21 NOTE — ED Notes (Signed)
Patient transported to X-ray via stretcher 

## 2011-06-21 NOTE — ED Notes (Signed)
Pt returned from radiology.
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
## 2011-06-21 NOTE — Telephone Encounter (Signed)
Per staff message I have scheduled appts. JMW  
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
## 2011-06-24 ENCOUNTER — Telehealth: Payer: Self-pay | Admitting: Oncology

## 2011-06-24 ENCOUNTER — Ambulatory Visit (HOSPITAL_BASED_OUTPATIENT_CLINIC_OR_DEPARTMENT_OTHER): Payer: Medicare PPO

## 2011-06-24 ENCOUNTER — Other Ambulatory Visit (HOSPITAL_BASED_OUTPATIENT_CLINIC_OR_DEPARTMENT_OTHER): Payer: Medicare PPO | Admitting: Lab

## 2011-06-24 VITALS — BP 147/82 | HR 77 | Temp 98.9°F

## 2011-06-24 DIAGNOSIS — Z5112 Encounter for antineoplastic immunotherapy: Secondary | ICD-10-CM

## 2011-06-24 DIAGNOSIS — D649 Anemia, unspecified: Secondary | ICD-10-CM

## 2011-06-24 DIAGNOSIS — C9000 Multiple myeloma not having achieved remission: Secondary | ICD-10-CM

## 2011-06-24 DIAGNOSIS — D472 Monoclonal gammopathy: Secondary | ICD-10-CM

## 2011-06-24 DIAGNOSIS — R197 Diarrhea, unspecified: Secondary | ICD-10-CM

## 2011-06-24 LAB — CBC WITH DIFFERENTIAL/PLATELET
BASO%: 0.3 % (ref 0.0–2.0)
Eosinophils Absolute: 0.1 10*3/uL (ref 0.0–0.5)
HCT: 29.2 % — ABNORMAL LOW (ref 38.4–49.9)
HGB: 9.7 g/dL — ABNORMAL LOW (ref 13.0–17.1)
MCHC: 33.2 g/dL (ref 32.0–36.0)
MONO#: 0.5 10*3/uL (ref 0.1–0.9)
NEUT#: 9.7 10*3/uL — ABNORMAL HIGH (ref 1.5–6.5)
NEUT%: 83.2 % — ABNORMAL HIGH (ref 39.0–75.0)
Platelets: 239 10*3/uL (ref 140–400)
WBC: 11.7 10*3/uL — ABNORMAL HIGH (ref 4.0–10.3)
lymph#: 1.3 10*3/uL (ref 0.9–3.3)

## 2011-06-24 MED ORDER — ONDANSETRON HCL 8 MG PO TABS
8.0000 mg | ORAL_TABLET | Freq: Once | ORAL | Status: AC
  Administered 2011-06-24: 8 mg via ORAL

## 2011-06-24 MED ORDER — BORTEZOMIB CHEMO SQ INJECTION 3.5 MG (2.5MG/ML)
1.0000 mg/m2 | Freq: Once | INTRAMUSCULAR | Status: AC
  Administered 2011-06-24: 2 mg via SUBCUTANEOUS
  Filled 2011-06-24: qty 2

## 2011-06-24 NOTE — Telephone Encounter (Signed)
Called pt numerous times , phone not working, no other phone is available, will mail appt

## 2011-06-24 NOTE — Patient Instructions (Signed)
[facility] Discharge Instructions for Patients Receiving Chemotherapy  Today you received the following chemotherapy agents Velcade.  To help prevent nausea and vomiting after your treatment, we encourage you to take your nausea medication as prescribed.   If you develop nausea and vomiting that is not controlled by your nausea medication, call the clinic. If it is after clinic hours your family physician or the after hours number for the clinic or go to the Emergency Department.   BELOW ARE SYMPTOMS THAT SHOULD BE REPORTED IMMEDIATELY:  *FEVER GREATER THAN 100.5 F  *CHILLS WITH OR WITHOUT FEVER  NAUSEA AND VOMITING THAT IS NOT CONTROLLED WITH YOUR NAUSEA MEDICATION  *UNUSUAL SHORTNESS OF BREATH  *UNUSUAL BRUISING OR BLEEDING  TENDERNESS IN MOUTH AND THROAT WITH OR WITHOUT PRESENCE OF ULCERS  *URINARY PROBLEMS  *BOWEL PROBLEMS  UNUSUAL RASH Items with * indicate a potential emergency and should be followed up as soon as possible.  One of the nurses will contact you 24 hours after your treatment. Please let the nurse know about any problems that you may have experienced. Feel free to call the clinic you have any questions or concerns. The clinic phone number is [phone].   I have been informed and understand all the instructions given to me. I know to contact the clinic, my physician, or go to the Emergency Department if any problems should occur. I do not have any questions at this time, but understand that I may call the clinic during office hours   should I have any questions or need assistance in obtaining follow up care.    __________________________________________  _____________  __________ Signature of Patient or Authorized Representative            Date                   Time    __________________________________________ Nurse's Signature    

## 2011-06-25 ENCOUNTER — Telehealth: Payer: Self-pay | Admitting: *Deleted

## 2011-06-25 NOTE — Telephone Encounter (Signed)
Received vm call from Carol Ada, BMT coordinator at [facility] stating that they are evaluating Mr. Whyte for autologous BMT& he has not had an ECHO & he prefers to have it done closer to home.  She reports that he needs a regular ECHO for heart function & states he has some cardiac history.  She asked if we could schedule & call her @ [phone].

## 2011-06-26 ENCOUNTER — Encounter: Payer: Self-pay | Admitting: Dietician

## 2011-06-26 ENCOUNTER — Telehealth: Payer: Self-pay | Admitting: Oncology

## 2011-06-26 ENCOUNTER — Other Ambulatory Visit: Payer: Self-pay | Admitting: Oncology

## 2011-06-26 DIAGNOSIS — C9000 Multiple myeloma not having achieved remission: Secondary | ICD-10-CM

## 2011-06-26 LAB — COMPREHENSIVE METABOLIC PANEL
ALT: 30 U/L (ref 0–53)
CO2: 19 mEq/L (ref 19–32)
Calcium: 8.9 mg/dL (ref 8.4–10.5)
Chloride: 104 mEq/L (ref 96–112)
Creatinine, Ser: 1.51 mg/dL — ABNORMAL HIGH (ref 0.50–1.35)
Glucose, Bld: 85 mg/dL (ref 70–99)
Total Protein: 5.3 g/dL — ABNORMAL LOW (ref 6.0–8.3)

## 2011-06-26 LAB — IMMUNOFIXATION ELECTROPHORESIS

## 2011-06-26 LAB — KAPPA/LAMBDA LIGHT CHAINS
Kappa free light chain: 0.16 mg/dL — ABNORMAL LOW (ref 0.33–1.94)
Lambda Free Lght Chn: 0.12 mg/dL — ABNORMAL LOW (ref 0.57–2.63)

## 2011-06-26 NOTE — Progress Notes (Signed)
Brief Out-patient Oncology Nutrition Note  Reason: Patient screened positive for nutrition risk for unintentional weight loss and decreased appetite.   Calvin Mccormick is a 66 year old male patient of Dr. Cyndie Chime, diagnosed with multiple myeloma. Contacted the patient via telephone for positive nutrition risk. Patient asleep at time of RD call, patient's wife requested I speak with her. She reported patient with no appetite. She reported he eats very little, mostly jello and apple sauce. She stated he drinks 1 to 2 Ensure nutrition supplements daily but that they are very expensive.   Wt Readings from Last 10 Encounters:  06/21/11 150 lb (68.04 kg)  06/17/11 154 lb 14.4 oz (70.262 kg)  05/31/11 160 lb (72.576 kg)  05/13/11 185 lb 4.8 oz (84.052 kg)  04/08/11 176 lb 1.6 oz (79.878 kg)  03/11/11 190 lb 12.8 oz (86.546 kg)  03/03/11 198 lb (89.812 kg)  02/05/11 197 lb 12.8 oz (89.721 kg)  01/17/11 200 lb 3.2 oz (90.81 kg)  01/04/11 201 lb 8 oz (91.4 kg)   I have encouraged her to provide additional calories and protein to patient. We discussed strategies to increase calorie and protein intake. She reported that she would like Calvin Mccormick to have a nutrition appointment because he has lost a lot of weight and is eating very poorly. I will send the scheduler a message to put patient on RD schedule for a nutrition appointment.   RD available for nutrition needs.   Calvin Mccormick [facility] [phone]

## 2011-06-26 NOTE — Discharge Summary (Signed)
Physician Discharge Summary  Patient ID: Calvin Mccormick MRN: [id number] DOB/AGE: 08-Dec-1945 66 y.o.  Admit date: 05/29/2011 Discharge date: 06/26/2011  Primary Care Physician:  Jeri Cos, MD   Discharge Diagnoses:   *Nausea & vomiting  Multiple myeloma on active chemotherapy  CKD (chronic kidney disease), stage III  AKI (acute kidney injury) Metabolic acidosis  Hypokalemia  Hypomagnesemia Uncontrolled HTN Anemia of chronic disease    Medication List  As of 06/26/2011  2:01 PM   STOP taking these medications         ramipril 2.5 MG tablet      sulindac 150 MG tablet         TAKE these medications         amLODipine 10 MG tablet   Commonly known as: NORVASC   Take 1 tablet (10 mg total) by mouth daily.      atorvastatin 20 MG tablet   Commonly known as: LIPITOR   Take 20 mg by mouth at bedtime.      dexamethasone 4 MG tablet   Commonly known as: DECADRON   Take 20 mg by mouth every 7 (seven) days. On Mondays.      esomeprazole 40 MG capsule   Commonly known as: NEXIUM   Take 40 mg by mouth daily before breakfast.      folic acid 1 MG tablet   Commonly known as: FOLVITE   Take 1 mg by mouth daily.      PRESCRIPTION MEDICATION   Inject into the vein every 7 (seven) days. Velcade injections.  His oncologist is Dr. Okey Dupre. He receives his treatments on Mondays.      sildenafil 50 MG tablet   Commonly known as: VIAGRA   Take 50 mg by mouth daily as needed. For erectile dysfunction.      traMADol 50 MG tablet   Commonly known as: ULTRAM   Take 50 mg by mouth every 6 (six) hours as needed. For pain. Maximum dose= 8 tablets per day.      vitamin E 400 UNIT capsule   Generic drug: vitamin E   Take 400 Units by mouth daily.             Disposition and Follow-up:  Cancer center 6/3 for chemo Dr.Granfortuna 6/10  Consults:  Dr.Granfortuna  Significant Diagnostic Studies:  No results found.  Brief H and P: Calvin Mccormick is a 66 y.o. African  American male who has a history of multiple myeloma, getting chemotherapy. Last dose was Monday. Patient has not eaten since Saturday per wife. He comes in with severe nausea and vomiting. He is c/o stomach pain, and uncontrolled nausea and vomiting- unable to take his medications at home. No fever, no chills. No blood in his stools  In the ER he was found to have a low potasium and high creatinine. Patient has not been able to urinate in the ER yet.  BP was also found to be high- patient not having head ache, CP, or blurred vision.   Hospital Course:  1. Nausea & vomiting: likely chemo related  Improved with supportive care, anti-emetics,  IVF  2. ARF with metabolic acidosis: multifactorial, ACE/NSAIDs, chemotherapy and volume depletion from N/V/Poor PO intake  Baseline creatinine 1.7 in 4/13  Was treated with IVF and holding his ACE and NSAID Improved from 4.5 on admission back to 1.3 at discharge  Renal USG without obstruction  3. Multiple myeloma: on active chemo, last on Monday  Appreciate Dr.Granfortuna's input  4. Hypokalemia: replace  5. Uncontrolled HTN: improved, continue Clonidine, Hydralazine/Amlidipine  6. Leukocytosis: improving,could be stress induced, afebrile with no clear source of infection at this point, CXR/KuB/UA unremarkable, continue to monitor  7. Anemia: CKD, Myeloma, chemo: drop in Hb, worsened by hemodilution now,  transfused 1unit PRBC during hospitalization    Time spent on Discharge: Signed: Montarius Kitagawa [facility]  06/26/2011, 2:01 PM

## 2011-06-26 NOTE — Telephone Encounter (Signed)
Talked to pt gave him appt for 6/24 and 7/1/ , will call pt again regarding 2-D echo, waiting for precert

## 2011-06-30 ENCOUNTER — Emergency Department (HOSPITAL_COMMUNITY)
Admission: EM | Admit: 2011-06-30 | Discharge: 2011-06-30 | Disposition: A | Discharge status: Home / Self Care | Payer: Medicare PPO | Attending: Emergency Medicine | Admitting: Emergency Medicine

## 2011-06-30 ENCOUNTER — Encounter (HOSPITAL_COMMUNITY): Payer: Self-pay | Admitting: *Deleted

## 2011-06-30 DIAGNOSIS — E785 Hyperlipidemia, unspecified: Secondary | ICD-10-CM | POA: Insufficient documentation

## 2011-06-30 DIAGNOSIS — R197 Diarrhea, unspecified: Secondary | ICD-10-CM | POA: Insufficient documentation

## 2011-06-30 DIAGNOSIS — Z79899 Other long term (current) drug therapy: Secondary | ICD-10-CM | POA: Insufficient documentation

## 2011-06-30 DIAGNOSIS — I129 Hypertensive chronic kidney disease with stage 1 through stage 4 chronic kidney disease, or unspecified chronic kidney disease: Secondary | ICD-10-CM | POA: Insufficient documentation

## 2011-06-30 DIAGNOSIS — E860 Dehydration: Secondary | ICD-10-CM

## 2011-06-30 DIAGNOSIS — N183 Chronic kidney disease, stage 3 unspecified: Secondary | ICD-10-CM | POA: Insufficient documentation

## 2011-06-30 DIAGNOSIS — C9000 Multiple myeloma not having achieved remission: Secondary | ICD-10-CM | POA: Insufficient documentation

## 2011-06-30 LAB — COMPREHENSIVE METABOLIC PANEL
ALT: 21 U/L (ref 0–53)
AST: 14 U/L (ref 0–37)
Albumin: 3 g/dL — ABNORMAL LOW (ref 3.5–5.2)
Alkaline Phosphatase: 94 U/L (ref 39–117)
BUN: 17 mg/dL (ref 6–23)
CO2: 19 mEq/L (ref 19–32)
Calcium: 8.4 mg/dL (ref 8.4–10.5)
Chloride: 101 mEq/L (ref 96–112)
Creatinine, Ser: 1.92 mg/dL — ABNORMAL HIGH (ref 0.50–1.35)
GFR calc Af Amer: 40 mL/min — ABNORMAL LOW (ref >90)
GFR calc non Af Amer: 34 mL/min — ABNORMAL LOW (ref >90)
Glucose, Bld: 86 mg/dL (ref 70–99)
Potassium: 4 mEq/L (ref 3.5–5.1)
Sodium: 131 mEq/L — ABNORMAL LOW (ref 135–145)
Total Bilirubin: 0.8 mg/dL (ref 0.3–1.2)
Total Protein: 5.4 g/dL — ABNORMAL LOW (ref 6.0–8.3)

## 2011-06-30 LAB — DIFFERENTIAL
Basophils Absolute: 0 10*3/uL (ref 0.0–0.1)
Basophils Relative: 0 % (ref 0–1)
Eosinophils Absolute: 0.1 10*3/uL (ref 0.0–0.7)
Eosinophils Relative: 1 % (ref 0–5)
Lymphocytes Relative: 10 % — ABNORMAL LOW (ref 12–46)
Lymphs Abs: 1.2 10*3/uL (ref 0.7–4.0)
Monocytes Absolute: 0.9 10*3/uL (ref 0.1–1.0)
Monocytes Relative: 8 % (ref 3–12)
Neutro Abs: 9.3 10*3/uL — ABNORMAL HIGH (ref 1.7–7.7)
Neutrophils Relative %: 81 % — ABNORMAL HIGH (ref 43–77)

## 2011-06-30 LAB — URINALYSIS, ROUTINE W REFLEX MICROSCOPIC
Bilirubin Urine: NEGATIVE
Glucose, UA: NEGATIVE mg/dL
Hgb urine dipstick: NEGATIVE
Ketones, ur: NEGATIVE mg/dL
Leukocytes, UA: NEGATIVE
Nitrite: NEGATIVE
Protein, ur: NEGATIVE mg/dL
Specific Gravity, Urine: 1.018 (ref 1.005–1.030)
Urobilinogen, UA: 0.2 mg/dL (ref 0.0–1.0)
pH: 5 (ref 5.0–8.0)

## 2011-06-30 LAB — CBC
HCT: 26.4 % — ABNORMAL LOW (ref 39.0–52.0)
Hemoglobin: 9.4 g/dL — ABNORMAL LOW (ref 13.0–17.0)
MCH: 31.1 pg (ref 26.0–34.0)
MCHC: 35.6 g/dL (ref 30.0–36.0)
MCV: 87.4 fL (ref 78.0–100.0)
Platelets: 202 10*3/uL (ref 150–400)
RBC: 3.02 MIL/uL — ABNORMAL LOW (ref 4.22–5.81)
RDW: 16.6 % — ABNORMAL HIGH (ref 11.5–15.5)
WBC: 11.5 10*3/uL — ABNORMAL HIGH (ref 4.0–10.5)

## 2011-06-30 LAB — LIPASE, BLOOD: Lipase: 163 U/L — ABNORMAL HIGH (ref 11–59)

## 2011-06-30 NOTE — ED Notes (Signed)
EMS reports pt has CA seen here, increased weakness over last several days, lethargic, poor appetite, bp 90/60 after bolus 250 ml 110/60 NSR 02 100%, IV # 20 rt ac

## 2011-06-30 NOTE — ED Notes (Signed)
[id number]<JY> Expected date:<BR> Expected time:<BR> Means of arrival:<BR> Comments:<BR> Ca Pt hypotensive- recovered after bolus

## 2011-06-30 NOTE — ED Provider Notes (Signed)
History     CSN: [id number]  Arrival date & time 06/30/11  1625   First MD Initiated Contact with Patient 06/30/11 1730      Chief Complaint  Patient presents with  . Fatigue  . Weakness  . Diarrhea    (Consider location/radiation/quality/duration/timing/severity/associated sxs/prior treatment) HPI Patient presents emergency department with several months with weakness, and fatigue, diarrhea.  Patient has cancer and is currently being treated.  Patient, states that today he is attempting to get out of a car and was too weak.  Patient denies chest pain, shortness of breath, vomiting, nausea, fever, dizziness, cough, syncope.       Past Medical History  Diagnosis Date  . Anemia 11/27/2010  . Hypertension, benign essential, goal below 140/90 11/27/2010  . Multiple myeloma without mention of remission 12/12/2010  . Multiple myeloma without mention of remission 12/12/2010  . CKD (chronic kidney disease), stage III 02/05/2011  . Hyperlipemia   . Diarrhea 06/17/2011    Hospital admission 05/30/11 velcade toxicity? Infectious?  . Chronic renal insufficiency, stage III (moderate) 06/17/2011    Due to myeloma & HTN    History reviewed. No pertinent past surgical history.  No family history on file.  History  Substance Use Topics  . Smoking status: Never Smoker   . Smokeless tobacco: Never Used  . Alcohol Use: No      Review of Systems  Allergies  Lactose intolerance (gi)  Home Medications   Current Outpatient Rx  Name Route Sig Dispense Refill  . ALLOPURINOL 100 MG PO TABS Oral Take 100 mg by mouth 2 (two) times daily.    Marland Kitchen AMLODIPINE BESYLATE 10 MG PO TABS Oral Take 1 tablet (10 mg total) by mouth daily. 30 tablet 0  . ATORVASTATIN CALCIUM 20 MG PO TABS Oral Take 20 mg by mouth at bedtime.     Marland Kitchen DEXAMETHASONE 4 MG PO TABS Oral Take 20 mg by mouth every 7 (seven) days. On Mondays.    Marland Kitchen ESOMEPRAZOLE MAGNESIUM 40 MG PO CPDR Oral Take 40 mg by mouth daily before breakfast.       . FOLIC ACID 1 MG PO TABS Oral Take 1 mg by mouth daily.      Marland Kitchen GABAPENTIN 100 MG PO CAPS Oral Take 100 mg by mouth 2 (two) times daily.    Marland Kitchen HYDROCHLOROTHIAZIDE 25 MG PO TABS Oral Take 25 mg by mouth daily.    Marland Kitchen HYDROCODONE-ACETAMINOPHEN 5-325 MG PO TABS Oral Take 2 tablets by mouth every 4 (four) hours as needed for pain. 10 tablet 0  . LENALIDOMIDE 10 MG PO CAPS Oral Take 1 capsule (10 mg total) by mouth daily. 1 po daily x 14 days, then 7 day break.   AUTH # [id number] Faxed to [facility] [phone] 14 capsule 0    This was faxed to [facility].  . MEGESTROL ACETATE 40 MG PO TABS Oral Take 40 mg by mouth 2 (two) times daily.    Marland Kitchen ONDANSETRON 8 MG PO TBDP Oral Take 1 tablet (8 mg total) by mouth every 8 (eight) hours as needed. For nausea. 20 tablet 3  . PRESCRIPTION MEDICATION Intravenous Inject into the vein every 7 (seven) days. Velcade injections.  His oncologist is Dr. Okey Dupre. He receives his treatments on Mondays.    Marland Kitchen SILDENAFIL CITRATE 50 MG PO TABS Oral Take 50 mg by mouth daily as needed. For erectile dysfunction.    . TRAMADOL HCL 50 MG PO TABS Oral Take 50  mg by mouth every 6 (six) hours as needed. For pain. Maximum dose= 8 tablets per day.    Marland Kitchen VALACYCLOVIR HCL 500 MG PO TABS Oral Take 1 tablet (500 mg total) by mouth once. 30 tablet 4  . VITAMIN E 400 UNITS PO CAPS Oral Take 400 Units by mouth daily.        BP 116/53  Pulse 40  Temp 97.7 F (36.5 C) (Oral)  Resp 16  SpO2 100%  Physical Exam  Nursing note and vitals reviewed. Constitutional: He is oriented to person, place, and time. He appears well-developed and well-nourished. No distress.  HENT:  Head: Normocephalic and atraumatic.  Eyes: Pupils are equal, round, and reactive to light.  Cardiovascular: Normal rate, regular rhythm and normal heart sounds.  Exam reveals no gallop and no friction rub.   No murmur heard. Pulmonary/Chest: Effort normal and breath sounds normal. No respiratory  distress. He has no wheezes.  Abdominal: Soft. Bowel sounds are normal. He exhibits no distension.  Neurological: He is alert and oriented to person, place, and time.  Skin: Skin is warm and dry.    ED Course  Procedures (including critical care time)  Labs Reviewed  CBC - Abnormal; Notable for the following:    WBC 11.5 (*)     RBC 3.02 (*)     Hemoglobin 9.4 (*)     HCT 26.4 (*)     RDW 16.6 (*)     All other components within normal limits  DIFFERENTIAL - Abnormal; Notable for the following:    Neutrophils Relative 81 (*)     Lymphocytes Relative 10 (*)     Neutro Abs 9.3 (*)     All other components within normal limits  COMPREHENSIVE METABOLIC PANEL - Abnormal; Notable for the following:    Sodium 131 (*)     Creatinine, Ser 1.92 (*)     Total Protein 5.4 (*)     Albumin 3.0 (*)     GFR calc non Af Amer 34 (*)     GFR calc Af Amer 40 (*)     All other components within normal limits  URINALYSIS, ROUTINE W REFLEX MICROSCOPIC - Abnormal; Notable for the following:    APPearance CLOUDY (*)     All other components within normal limits  LIPASE, BLOOD - Abnormal; Notable for the following:    Lipase 163 (*)     All other components within normal limits   8:30 PM recheck the patient is feeling better, and is more interactive.  Patient has not been eating well over the last few months.  Wife, states, that patient, just does not feel like eating.   Patient, and family would like to go home if possible.  Patient has responded well to fluids we have given him. Patient states he is feeling much better, and does not want to go home if possible.  Did advise him of his  elevated lipase.  He does help with his Dr. for recheck.  To return here as needed  The pulses documented in the computer being in the 30s and low 40s, and this is an accurate because they pulled him from the pulse oximetry is actual pulse rate is between 70-80. MDM  MDM Reviewed: vitals and nursing  note Interpretation: labs            Carlyle Dolly, PA-C 06/30/11 2057

## 2011-06-30 NOTE — ED Notes (Signed)
Pt's wife states that for the past couple of months pt has become increasingly weak and fatigued with diarrhea that "comes out before he realizes it".  They decided to bring the pt into the ER today because he was unable to get up out of the car at the [place].  Pt is lethargic.  Pt c/o pain 10/10 in his abdomen.  Pt states that this pain (mid abdomen) has been going on for 6 months and is not any better or worse today.

## 2011-06-30 NOTE — ED Notes (Signed)
Pt has multiple myeloma and undergoing treatment at the cancer center, pt has become weaker over the last several days with constant diarrhea, pt unable to eat, more lethargic, able to answer questions when directly asked. Pt presently has yellow green stool with foul odor.

## 2011-06-30 NOTE — ED Notes (Signed)
Pt verbalizes understanding.  Pt wife at bedside

## 2011-06-30 NOTE — Discharge Instructions (Signed)
Return here as needed. Follow up with your doctor. Increase your fluids at home.

## 2011-07-01 ENCOUNTER — Ambulatory Visit (HOSPITAL_COMMUNITY)
Admission: RE | Admit: 2011-07-01 | Discharge: 2011-07-01 | Disposition: A | Discharge status: Home / Self Care | Payer: Medicare PPO | Source: Ambulatory Visit | Attending: Oncology | Admitting: Oncology

## 2011-07-01 ENCOUNTER — Telehealth: Payer: Self-pay | Admitting: Oncology

## 2011-07-01 ENCOUNTER — Other Ambulatory Visit (HOSPITAL_BASED_OUTPATIENT_CLINIC_OR_DEPARTMENT_OTHER): Payer: Medicare PPO | Admitting: Lab

## 2011-07-01 ENCOUNTER — Other Ambulatory Visit (HOSPITAL_COMMUNITY): Payer: Self-pay | Admitting: Internal Medicine

## 2011-07-01 DIAGNOSIS — Z01818 Encounter for other preprocedural examination: Secondary | ICD-10-CM | POA: Insufficient documentation

## 2011-07-01 DIAGNOSIS — I10 Essential (primary) hypertension: Secondary | ICD-10-CM | POA: Insufficient documentation

## 2011-07-01 DIAGNOSIS — C9000 Multiple myeloma not having achieved remission: Secondary | ICD-10-CM | POA: Insufficient documentation

## 2011-07-01 DIAGNOSIS — D472 Monoclonal gammopathy: Secondary | ICD-10-CM

## 2011-07-01 LAB — CBC WITH DIFFERENTIAL/PLATELET
Basophils Absolute: 0 10*3/uL (ref 0.0–0.1)
EOS%: 1 % (ref 0.0–7.0)
HGB: 9.3 g/dL — ABNORMAL LOW (ref 13.0–17.1)
MCH: 31.6 pg (ref 27.2–33.4)
MCV: 93.6 fL (ref 79.3–98.0)
MONO%: 3.3 % (ref 0.0–14.0)
NEUT%: 88.3 % — ABNORMAL HIGH (ref 39.0–75.0)
RDW: 19.4 % — ABNORMAL HIGH (ref 11.0–14.6)

## 2011-07-01 NOTE — ED Provider Notes (Signed)
Medical screening examination/treatment/procedure(s) were conducted as a shared visit with non-physician practitioner(s) and myself.  I personally evaluated the patient during the encounter  Flint Melter, MD 07/01/11 [phone]

## 2011-07-01 NOTE — Telephone Encounter (Signed)
Gave pt appt for July and August 2013  °

## 2011-07-02 ENCOUNTER — Telehealth: Payer: Self-pay | Admitting: *Deleted

## 2011-07-02 NOTE — Telephone Encounter (Signed)
Received vm call from Carol Ada re: Pt's ECHO results.  Result obtained & faxed to [phone].  Karen's ph # is [phone].

## 2011-07-02 NOTE — Telephone Encounter (Signed)
Message given to pt re: labs per Dr. Cyndie Chime & these will be faxed to Carol Ada. At [facility].

## 2011-07-02 NOTE — Telephone Encounter (Signed)
Message copied by Sabino Snipes on Tue Jul 02, 2011  3:45 PM ------      Message from: Levert Feinstein      Created: Wed Jun 26, 2011  2:16 PM       Call pt - lab work good - responding to Rx - please see if we can forward to [email or web address]  At [facility] - transplant coordinator

## 2011-07-02 NOTE — Telephone Encounter (Signed)
Received call from Clydie Braun Rike/[facility] stating that they were unable to get pt in for mobilization due to scheduling issues until 07/24/11.  She wanted to make sure Dr Cyndie Chime knew this.  Reported that pt is scheduled for chemo & has appt with Dr Cyndie Chime 07/15/11 & will let Dr. Cyndie Chime know.

## 2011-07-04 ENCOUNTER — Other Ambulatory Visit: Payer: Self-pay | Admitting: *Deleted

## 2011-07-04 DIAGNOSIS — C9000 Multiple myeloma not having achieved remission: Secondary | ICD-10-CM

## 2011-07-04 MED ORDER — LENALIDOMIDE 10 MG PO CAPS: 10.0000 mg | ORAL_CAPSULE | Freq: Every day | ORAL | Status: DC

## 2011-07-07 ENCOUNTER — Other Ambulatory Visit: Payer: Self-pay | Admitting: Oncology

## 2011-07-08 ENCOUNTER — Other Ambulatory Visit: Payer: Medicare PPO | Admitting: Lab

## 2011-07-08 ENCOUNTER — Other Ambulatory Visit: Payer: Self-pay | Admitting: Oncology

## 2011-07-08 ENCOUNTER — Ambulatory Visit (HOSPITAL_BASED_OUTPATIENT_CLINIC_OR_DEPARTMENT_OTHER): Payer: Medicare PPO

## 2011-07-08 VITALS — BP 94/60 | HR 66 | Temp 97.6°F

## 2011-07-08 DIAGNOSIS — Z5112 Encounter for antineoplastic immunotherapy: Secondary | ICD-10-CM

## 2011-07-08 DIAGNOSIS — C9000 Multiple myeloma not having achieved remission: Secondary | ICD-10-CM

## 2011-07-08 DIAGNOSIS — D649 Anemia, unspecified: Secondary | ICD-10-CM

## 2011-07-08 DIAGNOSIS — D472 Monoclonal gammopathy: Secondary | ICD-10-CM

## 2011-07-08 LAB — CBC WITH DIFFERENTIAL/PLATELET
EOS%: 1.5 % (ref 0.0–7.0)
MCH: 31.9 pg (ref 27.2–33.4)
MCHC: 34.2 g/dL (ref 32.0–36.0)
MCV: 93.2 fL (ref 79.3–98.0)
MONO%: 8.9 % (ref 0.0–14.0)
RBC: 2.71 10*6/uL — ABNORMAL LOW (ref 4.20–5.82)
RDW: 18.3 % — ABNORMAL HIGH (ref 11.0–14.6)

## 2011-07-08 MED ORDER — ONDANSETRON HCL 8 MG PO TABS
8.0000 mg | ORAL_TABLET | Freq: Once | ORAL | Status: AC
  Administered 2011-07-08: 8 mg via ORAL

## 2011-07-08 MED ORDER — BORTEZOMIB CHEMO SQ INJECTION 3.5 MG (2.5MG/ML)
1.0000 mg/m2 | Freq: Once | INTRAMUSCULAR | Status: AC
  Administered 2011-07-08: 2 mg via SUBCUTANEOUS
  Filled 2011-07-08: qty 2

## 2011-07-08 NOTE — Progress Notes (Signed)
hgb 8.6 today.  Hgb baseline normally around 9.  Pt. Has dizziness and sob but no change with symptoms. No active bleeding.  Lonna Cobb NP notified and will continue to monitor.  Both patient and family notified to report any new  Changes. HL

## 2011-07-08 NOTE — Patient Instructions (Addendum)
[facility] Discharge Instructions for Patients Receiving Chemotherapy  Today you received the following chemotherapy agents Velcade To help prevent nausea and vomiting after your treatment, we encourage you to take your nausea medication Dr. Cyndie Chime.   If you develop nausea and vomiting that is not controlled by your nausea medication, call the clinic. If it is after clinic hours your family physician or the after hours number for the clinic or go to the Emergency Department.   BELOW ARE SYMPTOMS THAT SHOULD BE REPORTED IMMEDIATELY:  *FEVER GREATER THAN 100.5 F  *CHILLS WITH OR WITHOUT FEVER  NAUSEA AND VOMITING THAT IS NOT CONTROLLED WITH YOUR NAUSEA MEDICATION  *UNUSUAL SHORTNESS OF BREATH  *UNUSUAL BRUISING OR BLEEDING  TENDERNESS IN MOUTH AND THROAT WITH OR WITHOUT PRESENCE OF ULCERS  *URINARY PROBLEMS  *BOWEL PROBLEMS  UNUSUAL RASH Items with * indicate a potential emergency and should be followed up as soon as possible.   Feel free to call the clinic you have any questions or concerns. The clinic phone number is [phone].   I have been informed and understand all the instructions given to me. I know to contact the clinic, my physician, or go to the Emergency Department if any problems should occur. I do not have any questions at this time, but understand that I may call the clinic during office hours   should I have any questions or need assistance in obtaining follow up care.    __________________________________________  _____________  __________ Signature of Patient or Authorized Representative            Date                   Time    __________________________________________ Nurse's Signature

## 2011-07-09 ENCOUNTER — Encounter: Payer: Self-pay | Admitting: Oncology

## 2011-07-09 NOTE — Telephone Encounter (Signed)
RECEIVED A FAX FROM [facility] CONCERNING A CONFIRMATION OF PRESCRIPTION SHIPMENT FOR REVLIMID ON 07/10/11.

## 2011-07-09 NOTE — Progress Notes (Signed)
Patient wife stop by my office on yesterday afternoon needing a gas card.I gave her a $50.00 gas card,she said they don't have enough gas to make it home,I also explain to her that there is a service close to them that would bring them and take them,she said that she keeps her granddaughter on certain days because her daughter works 3 days a week and she can't bring her granddaughter on the bus.I ask what is she going to do for the next couple of weeks when her husband has to come for treatments and she said her son will send her some money to help with gas.

## 2011-07-12 ENCOUNTER — Encounter: Payer: Self-pay | Admitting: *Deleted

## 2011-07-12 ENCOUNTER — Telehealth: Payer: Self-pay | Admitting: *Deleted

## 2011-07-12 NOTE — Telephone Encounter (Signed)
Message copied by Sabino Snipes on Fri Jul 12, 2011 10:29 AM ------      Message from: Levert Feinstein      Created: Sun Jul 07, 2011  6:06 PM       Call pt Echocardiogram normal; fax result to Carol Ada, BMT, [facility]

## 2011-07-12 NOTE — Telephone Encounter (Signed)
Pt notified of Echo results & this has already been sent to [facility].

## 2011-07-12 NOTE — Progress Notes (Signed)
Clinical Social Work received request from patient's spouse to contact regarding assistance for patient. [name] states she is having difficulty driving to all appointments from [facility] due to the cost of gas.  She states she cannot use public transportation because she also cares for her granddaughter. She states her daughter is a Lawyer and providing homecare, however, they cannot afford diapers, bed pads, etc. CSW recommended patient call [facility] on Aging and CSW made referral for ACS. CSW encouraged [name] to call with any additional questions or concerns.  Kathrin Penner, MSW, [facility] Clinical Social Worker [facility] [phone]

## 2011-07-15 ENCOUNTER — Ambulatory Visit: Payer: Medicare PPO

## 2011-07-15 ENCOUNTER — Telehealth: Payer: Self-pay | Admitting: Oncology

## 2011-07-15 ENCOUNTER — Other Ambulatory Visit (HOSPITAL_BASED_OUTPATIENT_CLINIC_OR_DEPARTMENT_OTHER): Payer: Medicare PPO | Admitting: Lab

## 2011-07-15 ENCOUNTER — Encounter: Payer: Medicare PPO | Admitting: Nutrition

## 2011-07-15 ENCOUNTER — Ambulatory Visit (HOSPITAL_BASED_OUTPATIENT_CLINIC_OR_DEPARTMENT_OTHER): Payer: Medicare PPO | Admitting: Oncology

## 2011-07-15 VITALS — BP 114/56 | HR 67 | Temp 97.4°F | Ht 71.0 in | Wt 146.8 lb

## 2011-07-15 DIAGNOSIS — D472 Monoclonal gammopathy: Secondary | ICD-10-CM

## 2011-07-15 DIAGNOSIS — G620 Drug-induced polyneuropathy: Secondary | ICD-10-CM

## 2011-07-15 DIAGNOSIS — N289 Disorder of kidney and ureter, unspecified: Secondary | ICD-10-CM

## 2011-07-15 DIAGNOSIS — C9000 Multiple myeloma not having achieved remission: Secondary | ICD-10-CM

## 2011-07-15 DIAGNOSIS — D649 Anemia, unspecified: Secondary | ICD-10-CM

## 2011-07-15 DIAGNOSIS — K521 Toxic gastroenteritis and colitis: Secondary | ICD-10-CM

## 2011-07-15 LAB — CBC WITH DIFFERENTIAL/PLATELET
BASO%: 0.2 % (ref 0.0–2.0)
Eosinophils Absolute: 0.1 10*3/uL (ref 0.0–0.5)
MONO#: 0.3 10*3/uL (ref 0.1–0.9)
MONO%: 3.1 % (ref 0.0–14.0)
NEUT#: 7.2 10*3/uL — ABNORMAL HIGH (ref 1.5–6.5)
RBC: 2.55 10*6/uL — ABNORMAL LOW (ref 4.20–5.82)
RDW: 19.5 % — ABNORMAL HIGH (ref 11.0–14.6)
WBC: 8.3 10*3/uL (ref 4.0–10.3)

## 2011-07-15 LAB — TECHNOLOGIST REVIEW
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
## 2011-07-15 NOTE — Telephone Encounter (Signed)
gv pt appt schedule for July. D/t per 7/8 pof.

## 2011-07-16 ENCOUNTER — Encounter: Payer: Self-pay | Admitting: Nutrition

## 2011-07-16 NOTE — Progress Notes (Signed)
Patient had a nutrition appointment set up while he was to receive chemotherapy however, his chemo was cancelled.  I have notified scheduling to call patient and ask if he would like to reschedule his nutrition appointment.

## 2011-07-19 ENCOUNTER — Encounter: Payer: Self-pay | Admitting: *Deleted

## 2011-07-22 ENCOUNTER — Other Ambulatory Visit: Payer: Medicare PPO | Admitting: Lab

## 2011-07-22 ENCOUNTER — Ambulatory Visit: Payer: Medicare PPO

## 2011-07-24 ENCOUNTER — Telehealth: Payer: Self-pay | Admitting: *Deleted

## 2011-07-24 ENCOUNTER — Other Ambulatory Visit: Payer: Self-pay | Admitting: *Deleted

## 2011-07-24 ENCOUNTER — Emergency Department (HOSPITAL_COMMUNITY): Payer: Medicare PPO

## 2011-07-24 ENCOUNTER — Inpatient Hospital Stay (HOSPITAL_COMMUNITY)
Admission: EM | Admit: 2011-07-24 | Discharge: 2011-07-29 | DRG: 391 | Disposition: A | Discharge status: Home Health | Payer: Medicare PPO | Attending: Internal Medicine | Admitting: Internal Medicine

## 2011-07-24 ENCOUNTER — Encounter (HOSPITAL_COMMUNITY): Payer: Self-pay | Admitting: Emergency Medicine

## 2011-07-24 DIAGNOSIS — N179 Acute kidney failure, unspecified: Secondary | ICD-10-CM | POA: Diagnosis present

## 2011-07-24 DIAGNOSIS — R197 Diarrhea, unspecified: Secondary | ICD-10-CM | POA: Diagnosis present

## 2011-07-24 DIAGNOSIS — Z79899 Other long term (current) drug therapy: Secondary | ICD-10-CM

## 2011-07-24 DIAGNOSIS — E876 Hypokalemia: Secondary | ICD-10-CM | POA: Diagnosis present

## 2011-07-24 DIAGNOSIS — R5381 Other malaise: Secondary | ICD-10-CM | POA: Diagnosis present

## 2011-07-24 DIAGNOSIS — N183 Chronic kidney disease, stage 3 unspecified: Secondary | ICD-10-CM | POA: Diagnosis present

## 2011-07-24 DIAGNOSIS — T451X5A Adverse effect of antineoplastic and immunosuppressive drugs, initial encounter: Secondary | ICD-10-CM | POA: Diagnosis present

## 2011-07-24 DIAGNOSIS — I10 Essential (primary) hypertension: Secondary | ICD-10-CM | POA: Diagnosis present

## 2011-07-24 DIAGNOSIS — D631 Anemia in chronic kidney disease: Secondary | ICD-10-CM | POA: Diagnosis present

## 2011-07-24 DIAGNOSIS — I129 Hypertensive chronic kidney disease with stage 1 through stage 4 chronic kidney disease, or unspecified chronic kidney disease: Secondary | ICD-10-CM | POA: Diagnosis present

## 2011-07-24 DIAGNOSIS — N189 Chronic kidney disease, unspecified: Secondary | ICD-10-CM | POA: Diagnosis present

## 2011-07-24 DIAGNOSIS — IMO0002 Reserved for concepts with insufficient information to code with codable children: Secondary | ICD-10-CM

## 2011-07-24 DIAGNOSIS — R630 Anorexia: Secondary | ICD-10-CM | POA: Diagnosis present

## 2011-07-24 DIAGNOSIS — R112 Nausea with vomiting, unspecified: Principal | ICD-10-CM | POA: Diagnosis present

## 2011-07-24 DIAGNOSIS — D472 Monoclonal gammopathy: Secondary | ICD-10-CM | POA: Diagnosis present

## 2011-07-24 DIAGNOSIS — C9000 Multiple myeloma not having achieved remission: Secondary | ICD-10-CM | POA: Diagnosis present

## 2011-07-24 DIAGNOSIS — D6481 Anemia due to antineoplastic chemotherapy: Secondary | ICD-10-CM | POA: Diagnosis present

## 2011-07-24 DIAGNOSIS — E43 Unspecified severe protein-calorie malnutrition: Secondary | ICD-10-CM | POA: Diagnosis present

## 2011-07-24 DIAGNOSIS — Z9221 Personal history of antineoplastic chemotherapy: Secondary | ICD-10-CM

## 2011-07-24 DIAGNOSIS — R109 Unspecified abdominal pain: Secondary | ICD-10-CM | POA: Diagnosis present

## 2011-07-24 DIAGNOSIS — E860 Dehydration: Secondary | ICD-10-CM | POA: Diagnosis present

## 2011-07-24 DIAGNOSIS — E785 Hyperlipidemia, unspecified: Secondary | ICD-10-CM | POA: Diagnosis present

## 2011-07-24 DIAGNOSIS — D630 Anemia in neoplastic disease: Secondary | ICD-10-CM | POA: Diagnosis present

## 2011-07-24 DIAGNOSIS — D649 Anemia, unspecified: Secondary | ICD-10-CM

## 2011-07-24 HISTORY — DX: Hypokalemia: E87.6

## 2011-07-24 HISTORY — DX: Hypomagnesemia: E83.42

## 2011-07-24 LAB — DIFFERENTIAL
Basophils Absolute: 0 10*3/uL (ref 0.0–0.1)
Eosinophils Relative: 0 % (ref 0–5)
Lymphs Abs: 1.2 10*3/uL (ref 0.7–4.0)
Monocytes Absolute: 0.9 10*3/uL (ref 0.1–1.0)
Monocytes Relative: 8 % (ref 3–12)
Neutro Abs: 9.1 10*3/uL — ABNORMAL HIGH (ref 1.7–7.7)

## 2011-07-24 LAB — CBC
HCT: 25.2 % — ABNORMAL LOW (ref 39.0–52.0)
MCH: 31.4 pg (ref 26.0–34.0)
MCV: 91 fL (ref 78.0–100.0)
Platelets: 257 10*3/uL (ref 150–400)
RDW: 17 % — ABNORMAL HIGH (ref 11.5–15.5)
WBC: 11.2 10*3/uL — ABNORMAL HIGH (ref 4.0–10.5)

## 2011-07-24 LAB — URINALYSIS, ROUTINE W REFLEX MICROSCOPIC
Ketones, ur: NEGATIVE mg/dL
Leukocytes, UA: NEGATIVE
Nitrite: NEGATIVE
Specific Gravity, Urine: 1.019 (ref 1.005–1.030)
pH: 5 (ref 5.0–8.0)

## 2011-07-24 LAB — LACTIC ACID, PLASMA: Lactic Acid, Venous: 3.2 mmol/L — ABNORMAL HIGH (ref 0.5–2.2)

## 2011-07-24 LAB — COMPREHENSIVE METABOLIC PANEL
ALT: 39 U/L (ref 0–53)
Alkaline Phosphatase: 97 U/L (ref 39–117)
BUN: 20 mg/dL (ref 6–23)
CO2: 16 mEq/L — ABNORMAL LOW (ref 19–32)
GFR calc Af Amer: 40 mL/min — ABNORMAL LOW (ref >90)
GFR calc non Af Amer: 35 mL/min — ABNORMAL LOW (ref >90)
Glucose, Bld: 112 mg/dL — ABNORMAL HIGH (ref 70–99)
Potassium: 3.6 mEq/L (ref 3.5–5.1)
Sodium: 138 mEq/L (ref 135–145)

## 2011-07-24 LAB — PROTIME-INR: Prothrombin Time: 15.1 seconds (ref 11.6–15.2)

## 2011-07-24 LAB — APTT: aPTT: 34 seconds (ref 24–37)

## 2011-07-24 LAB — PROCALCITONIN: Procalcitonin: <0.1 ng/mL

## 2011-07-24 MED ORDER — DEXTROSE-NACL 5-0.9 % IV SOLN
INTRAVENOUS | Status: DC
  Administered 2011-07-24 – 2011-07-26 (×4): via INTRAVENOUS

## 2011-07-24 MED ORDER — SODIUM CHLORIDE 0.9 % IV SOLN
1000.0000 mL | INTRAVENOUS | Status: DC
  Administered 2011-07-24: 1000 mL via INTRAVENOUS

## 2011-07-24 MED ORDER — ACETAMINOPHEN 650 MG RE SUPP: 650.0000 mg | Freq: Four times a day (QID) | RECTAL | Status: DC | PRN

## 2011-07-24 MED ORDER — MEGESTROL ACETATE 40 MG PO TABS
40.0000 mg | ORAL_TABLET | Freq: Two times a day (BID) | ORAL | Status: DC
  Administered 2011-07-24 – 2011-07-25 (×2): 40 mg via ORAL
  Filled 2011-07-24 (×3): qty 1

## 2011-07-24 MED ORDER — DEXAMETHASONE 4 MG PO TABS: 20.0000 mg | ORAL_TABLET | ORAL | Status: DC

## 2011-07-24 MED ORDER — METOCLOPRAMIDE HCL 5 MG/ML IJ SOLN
10.0000 mg | Freq: Once | INTRAMUSCULAR | Status: AC
  Administered 2011-07-24: 10 mg via INTRAVENOUS
  Filled 2011-07-24: qty 2

## 2011-07-24 MED ORDER — ONDANSETRON HCL 4 MG PO TABS: 4.0000 mg | ORAL_TABLET | Freq: Four times a day (QID) | ORAL | Status: DC | PRN

## 2011-07-24 MED ORDER — TRAMADOL HCL 50 MG PO TABS
50.0000 mg | ORAL_TABLET | Freq: Four times a day (QID) | ORAL | Status: DC | PRN
  Administered 2011-07-27 – 2011-07-29 (×3): 50 mg via ORAL
  Filled 2011-07-24 (×4): qty 1

## 2011-07-24 MED ORDER — ONDANSETRON HCL 4 MG/2ML IJ SOLN
INTRAMUSCULAR | Status: AC
  Administered 2011-07-24: 13:00:00
  Filled 2011-07-24: qty 2

## 2011-07-24 MED ORDER — SODIUM CHLORIDE 0.9 % IV SOLN
1000.0000 mL | Freq: Once | INTRAVENOUS | Status: AC
  Administered 2011-07-24: 1000 mL via INTRAVENOUS

## 2011-07-24 MED ORDER — IOHEXOL 300 MG/ML  SOLN
100.0000 mL | Freq: Once | INTRAMUSCULAR | Status: AC | PRN
  Administered 2011-07-24: 80 mL via INTRAVENOUS

## 2011-07-24 MED ORDER — PANTOPRAZOLE SODIUM 40 MG IV SOLR
40.0000 mg | Freq: Two times a day (BID) | INTRAVENOUS | Status: DC
  Administered 2011-07-24 – 2011-07-29 (×9): 40 mg via INTRAVENOUS
  Filled 2011-07-24 (×11): qty 40

## 2011-07-24 MED ORDER — SODIUM CHLORIDE 0.9 % IV SOLN: 1000.0000 mL | INTRAVENOUS | Status: DC

## 2011-07-24 MED ORDER — FOLIC ACID 1 MG PO TABS
1.0000 mg | ORAL_TABLET | Freq: Every day | ORAL | Status: DC
  Administered 2011-07-24 – 2011-07-25 (×2): 1 mg via ORAL
  Filled 2011-07-24 (×2): qty 1

## 2011-07-24 MED ORDER — ADULT MULTIVITAMIN W/MINERALS CH
1.0000 | ORAL_TABLET | Freq: Every day | ORAL | Status: DC
  Administered 2011-07-24 – 2011-07-25 (×2): 1 via ORAL
  Filled 2011-07-24 (×2): qty 1

## 2011-07-24 MED ORDER — ONDANSETRON HCL 4 MG/2ML IJ SOLN
4.0000 mg | Freq: Once | INTRAMUSCULAR | Status: AC
  Administered 2011-07-24: 4 mg via INTRAVENOUS
  Filled 2011-07-24: qty 2

## 2011-07-24 MED ORDER — MORPHINE SULFATE 2 MG/ML IJ SOLN
2.0000 mg | INTRAMUSCULAR | Status: DC | PRN
  Administered 2011-07-28: 2 mg via INTRAVENOUS
  Filled 2011-07-24: qty 1

## 2011-07-24 MED ORDER — ZOLPIDEM TARTRATE 5 MG PO TABS: 5.0000 mg | ORAL_TABLET | Freq: Every evening | ORAL | Status: DC | PRN

## 2011-07-24 MED ORDER — ACETAMINOPHEN 325 MG PO TABS: 650.0000 mg | ORAL_TABLET | Freq: Four times a day (QID) | ORAL | Status: DC | PRN

## 2011-07-24 MED ORDER — ONDANSETRON HCL 4 MG/2ML IJ SOLN: 4.0000 mg | Freq: Four times a day (QID) | INTRAMUSCULAR | Status: DC | PRN

## 2011-07-24 MED ORDER — MORPHINE SULFATE 4 MG/ML IJ SOLN
4.0000 mg | Freq: Once | INTRAMUSCULAR | Status: AC
  Administered 2011-07-24: 4 mg via INTRAVENOUS
  Filled 2011-07-24: qty 1

## 2011-07-24 NOTE — ED Provider Notes (Signed)
History     CSN: [id number]  Arrival date & time 07/24/11  1312   First MD Initiated Contact with Patient 07/24/11 1325      Chief Complaint  Patient presents with  . Weakness  . Nausea  . Emesis    HPI Patient presents to the emergency room complaining of weakness, nausea, vomiting, diarrhea and abdominal pain. He symptoms have been ongoing for at least several weeks apparently per family. He is followed at in the oncology clinic for multiple myeloma. His last chemotherapy was canceled because of his recurrent symptoms. Over the last few days it has progressed. He has had several episodes of nausea vomiting as well as 2 episodes of diarrhea. Patient feels weak all over. The symptoms are severe and nothing seems to be helping. Past Medical History  Diagnosis Date  . Anemia 11/27/2010  . Hypertension, benign essential, goal below 140/90 11/27/2010  . Multiple myeloma without mention of remission 12/12/2010  . Multiple myeloma without mention of remission 12/12/2010  . CKD (chronic kidney disease), stage III 02/05/2011  . Hyperlipemia   . Diarrhea 06/17/2011    Hospital admission 05/30/11 velcade toxicity? Infectious?  . Chronic renal insufficiency, stage III (moderate) 06/17/2011    Due to myeloma & HTN    History reviewed. No pertinent past surgical history.  No family history on file.  History  Substance Use Topics  . Smoking status: Never Smoker   . Smokeless tobacco: Never Used  . Alcohol Use: No      Review of Systems  Constitutional: Positive for fatigue. Negative for fever.  HENT: Negative for neck pain.   Respiratory: Negative for cough and shortness of breath.   Cardiovascular: Negative for chest pain.  Gastrointestinal: Positive for abdominal pain.  Genitourinary: Negative for dysuria.  All other systems reviewed and are negative.    Allergies  Lactose intolerance (gi)  Home Medications   Current Outpatient Rx  Name Route Sig Dispense Refill  .  ALLOPURINOL 100 MG PO TABS Oral Take 100 mg by mouth 2 (two) times daily.    . ATORVASTATIN CALCIUM 20 MG PO TABS Oral Take 20 mg by mouth at bedtime.     Marland Kitchen DEXAMETHASONE 4 MG PO TABS Oral Take 20 mg by mouth every 7 (seven) days. On Mondays.    Marland Kitchen ESOMEPRAZOLE MAGNESIUM 40 MG PO CPDR Oral Take 40 mg by mouth daily before breakfast.      . FOLIC ACID 1 MG PO TABS Oral Take 1 mg by mouth daily.      Marland Kitchen GABAPENTIN 100 MG PO CAPS Oral Take 100 mg by mouth 2 (two) times daily.    Marland Kitchen HYDROCHLOROTHIAZIDE 25 MG PO TABS Oral Take 25 mg by mouth daily.    Marland Kitchen LENALIDOMIDE 10 MG PO CAPS Oral Take 1 capsule (10 mg total) by mouth daily. 1 po daily x 14 days, then 7 day break.   AUTH # [id number] Faxed to [facility] [phone] Pt to start 07/10/11 14 capsule 0    This was faxed to [facility].  . MEGESTROL ACETATE 40 MG PO TABS Oral Take 40 mg by mouth 2 (two) times daily.    Marland Kitchen ONDANSETRON 8 MG PO TBDP Oral Take 1 tablet (8 mg total) by mouth every 8 (eight) hours as needed. For nausea. 20 tablet 3  . PRESCRIPTION MEDICATION Intravenous Inject into the vein every 7 (seven) days. Velcade injections.  His oncologist is Dr. Okey Dupre. He receives his treatments on Mondays.    Marland Kitchen  SILDENAFIL CITRATE 50 MG PO TABS Oral Take 50 mg by mouth daily as needed. For erectile dysfunction.    . TRAMADOL HCL 50 MG PO TABS Oral Take 50 mg by mouth every 6 (six) hours as needed. For pain. Maximum dose= 8 tablets per day.    Marland Kitchen VALACYCLOVIR HCL 500 MG PO TABS Oral Take 1 tablet (500 mg total) by mouth once. 30 tablet 4  . VITAMIN E 400 UNITS PO CAPS Oral Take 400 Units by mouth daily.        BP 153/80  Pulse 80  Temp 98.7 F (37.1 C) (Oral)  Resp 23  SpO2 100%  Physical Exam  Nursing note and vitals reviewed. Constitutional: He appears distressed.       Chronically ill appearing  HENT:  Head: Normocephalic and atraumatic.  Right Ear: External ear normal.  Left Ear: External ear normal.        Mucous membranes  dry  Eyes: Conjunctivae are normal. Right eye exhibits no discharge. Left eye exhibits no discharge. No scleral icterus.  Neck: Neck supple. No tracheal deviation present.  Cardiovascular: Normal rate, regular rhythm and intact distal pulses.   Pulmonary/Chest: Effort normal and breath sounds normal. No stridor. No respiratory distress. He has no wheezes. He has no rales.  Abdominal: Soft. Bowel sounds are normal. He exhibits no distension and no mass. There is generalized tenderness. There is guarding. There is no rebound. No hernia.  Musculoskeletal: He exhibits no edema and no tenderness.  Neurological: He is alert. He displays tremor. No sensory deficit. Cranial nerve deficit:  no gross defecits noted. He exhibits normal muscle tone. He displays no seizure activity. Coordination normal.       Weakness all 4 extremities, difficulty lifting his arms and his legs off the bed  Skin: Skin is warm and dry. No rash noted. No erythema.  Psychiatric: He has a normal mood and affect.    ED Course  Procedures (including critical care time)  Rate: 89  Rhythm: normal sinus rhythm, ventricular premature complexes  QRS Axis: normal  Intervals: short pr interval  ST/T Wave abnormalities: borderline t wave changes  Conduction Disutrbances:none  Narrative Interpretation: borderline prolonged qt  Old EKG Reviewed: No significant changes  Labs Reviewed  CBC - Abnormal; Notable for the following:    WBC 11.2 (*)     RBC 2.77 (*)     Hemoglobin 8.7 (*)     HCT 25.2 (*)     RDW 17.0 (*)     All other components within normal limits  DIFFERENTIAL - Abnormal; Notable for the following:    Neutrophils Relative 81 (*)     Lymphocytes Relative 11 (*)     Neutro Abs 9.1 (*)     All other components within normal limits  COMPREHENSIVE METABOLIC PANEL - Abnormal; Notable for the following:    CO2 16 (*)     Glucose, Bld 112 (*)     Creatinine, Ser 1.89 (*)     Calcium 7.0 (*)     Total Protein 5.3 (*)      Albumin 2.6 (*)     GFR calc non Af Amer 35 (*)     GFR calc Af Amer 40 (*)     All other components within normal limits  URINALYSIS, ROUTINE W REFLEX MICROSCOPIC - Abnormal; Notable for the following:    APPearance CLOUDY (*)     All other components within normal limits  LACTIC ACID, PLASMA - Abnormal;  Notable for the following:    Lactic Acid, Venous 3.2 (*)     All other components within normal limits  PROTIME-INR  APTT  TYPE AND SCREEN  PROCALCITONIN   Dg Chest Port 1 View  07/24/2011  *RADIOLOGY REPORT*  Clinical Data: Nausea, vomiting, weakness  PORTABLE CHEST - 1 VIEW  Comparison: Chest x-ray of 06/08/2011  Findings: The lungs are clear.  The heart is within normal limits in size.  There is a mild thoracic scoliosis convex to the right.  IMPRESSION: No active lung disease.  Mild thoracic scoliosis.  Original Report Authenticated By: Juline Patch, M.D.      MDM  Anemia is chronic and not significantly changed.  Pt hemodynamically stable but complains of diffuse abdominal pain and has decreased co2 level , increased lactic acid.  CT scan ordered for further evaluation.        Celene Kras, MD 07/25/11 [phone]

## 2011-07-24 NOTE — Progress Notes (Signed)
Pt with 2 loose stools noted. Pt also with stage II ulcer noted to sacrum. Paged [facility] to get order for flexiseal. Awaiting call back.

## 2011-07-24 NOTE — ED Notes (Signed)
Pt presenting to ed with c/o generalized weakness with positive nausea and vomiting with abdominal pain 10/10.Pt states diarrhea x 2 days ago. Pt states pain in bilateral knees that been going on for a long time. Pt denies fever. Pt states decreased appetite.

## 2011-07-24 NOTE — ED Notes (Signed)
[id number]<AV> Expected date:<BR> Expected time: 1:05 PM<BR> Means of arrival:Ambulance<BR> Comments:<BR> 66yoM n/v

## 2011-07-24 NOTE — ED Notes (Signed)
Help patient with urinal. Patient unable to urinate at this time.

## 2011-07-24 NOTE — Telephone Encounter (Signed)
Faxed refill request for Revlimid.  Request to MD for review.

## 2011-07-24 NOTE — H&P (Addendum)
PCP:   Jeri Cos, MD   Chief Complaint:  Abdominal pain/nausea/vomiting/generaklized weakness for a few days.  HPI: Calvin Mccormick, who is followed by Dr Cyndie Chime for Multiple myeloma, and last had chemotherapy in June, comes in with periumbilical abdominal pain associated with nausea/vomiting, and occasional diarrhea. He also has poor appetite and is generally weak. He has lost significant amount of weight since his chemo. He was placed on megace by Dr Bascom Levels, but has not seen much improvement. Plans for Melphalan at [facility] have been put on hold due to Calvin Kouba precipitous decline. He comes in dehydrated and unable to keep anything down. He denies fever, cough, dysuria or chest pain.  Review of Systems:  Unremarkable except in the hpi.  Past Medical History: Past Medical History  Diagnosis Date  . Anemia 11/27/2010  . Hypertension, benign essential, goal below 140/90 11/27/2010  . Multiple myeloma without mention of remission 12/12/2010  . Multiple myeloma without mention of remission 12/12/2010  . CKD (chronic kidney disease), stage III 02/05/2011  . Hyperlipemia   . Diarrhea 06/17/2011    Hospital admission 05/30/11 velcade toxicity? Infectious?  . Chronic renal insufficiency, stage III (moderate) 06/17/2011    Due to myeloma & HTN   History reviewed. No pertinent past surgical history.  Medications: Prior to Admission medications   Medication Sig Start Date End Date Taking? Authorizing Provider  allopurinol (ZYLOPRIM) 100 MG tablet Take 100 mg by mouth 2 (two) times daily. 06/17/11  Yes Historical Provider, MD  atorvastatin (LIPITOR) 20 MG tablet Take 20 mg by mouth at bedtime.    Yes Historical Provider, MD  dexamethasone (DECADRON) 4 MG tablet Take 20 mg by mouth every 7 (seven) days. On Mondays.   Yes Historical Provider, MD  esomeprazole (NEXIUM) 40 MG capsule Take 40 mg by mouth daily before breakfast.     Yes Historical Provider, MD  folic acid (FOLVITE) 1 MG tablet Take  1 mg by mouth daily.     Yes Historical Provider, MD  gabapentin (NEURONTIN) 100 MG capsule Take 100 mg by mouth 2 (two) times daily. 06/17/11  Yes Historical Provider, MD  hydrochlorothiazide (HYDRODIURIL) 25 MG tablet Take 25 mg by mouth daily.   Yes Historical Provider, MD  lenalidomide (REVLIMID) 10 MG capsule Take 1 capsule (10 mg total) by mouth daily. 1 po daily x 14 days, then 7 day break.   AUTH # [id number] Faxed to [facility] [phone] Pt to start 07/10/11 07/04/11  Yes Ladene Artist, MD  megestrol (MEGACE) 40 MG tablet Take 40 mg by mouth 2 (two) times daily. 06/17/11  Yes Historical Provider, MD  ondansetron (ZOFRAN-ODT) 8 MG disintegrating tablet Take 1 tablet (8 mg total) by mouth every 8 (eight) hours as needed. For nausea. 06/11/11  Yes Levert Feinstein, MD  PRESCRIPTION MEDICATION Inject into the vein every 7 (seven) days. Velcade injections.  His oncologist is Dr. Okey Dupre. He receives his treatments on Mondays.   Yes Historical Provider, MD  sildenafil (VIAGRA) 50 MG tablet Take 50 mg by mouth daily as needed. For erectile dysfunction.   Yes Historical Provider, MD  traMADol (ULTRAM) 50 MG tablet Take 50 mg by mouth every 6 (six) hours as needed. For pain. Maximum dose= 8 tablets per day.   Yes Historical Provider, MD  valACYclovir (VALTREX) 500 MG tablet Take 500 mg by mouth daily.   Yes Historical Provider, MD  vitamin E (VITAMIN E) 400 UNIT capsule Take 400 Units by mouth daily.  Yes Historical Provider, MD    Allergies:   Allergies  Allergen Reactions  . Lactose Intolerance (Gi) Nausea And Vomiting    Social History:  reports that he has never smoked. He has never used smokeless tobacco. He reports that he does not drink alcohol or use illicit drugs.  Family History: No family history on file.  Physical Exam: Filed Vitals:   07/24/11 1320 07/24/11 1530 07/24/11 1752  BP: 153/80  147/75  Pulse: 80  79  Temp: 98.7 F (37.1 C)    TempSrc: Oral      Resp: 23 19 17   SpO2: 100% 100% 99%   Lethargic and apathetic, lying comfortably in bed. No oral thrush.  Lungs clear. S1S2. No murmurs. RRR. Abdomen- soft, some tenderness to deep palpation periumbilical area. +BS. CNS- grossly Intact. Extremities- no pedal edema.   Labs on Admission:   [facility] 07/24/11 1405  NA 138  K 3.6  CL 105  CO2 16*  GLUCOSE 112*  BUN 20  CREATININE 1.89*  CALCIUM 7.0*  MG --  PHOS --    Basename 07/24/11 1405  AST 18  ALT 39  ALKPHOS 97  BILITOT 0.9  PROT 5.3*  ALBUMIN 2.6*   No results found for this basename: LIPASE:2,AMYLASE:2 in the last 72 hours  Basename 07/24/11 1405  WBC 11.2*  NEUTROABS 9.1*  HGB 8.7*  HCT 25.2*  MCV 91.0  PLT 257   No results found for this basename: CKTOTAL:3,CKMB:3,CKMBINDEX:3,TROPONINI:3 in the last 72 hours No results found for this basename: TSH,T4TOTAL,FREET3,T3FREE,THYROIDAB in the last 72 hours No results found for this basename: VITAMINB12:2,FOLATE:2,FERRITIN:2,TIBC:2,IRON:2,RETICCTPCT:2 in the last 72 hours  Radiological Exams on Admission: Ct Abdomen Pelvis W Contrast  07/24/2011  *RADIOLOGY REPORT*  Clinical Data: Diffuse abdominal pain.  Nausea and vomiting. Diarrhea.  CT ABDOMEN AND PELVIS WITH CONTRAST  Technique:  Multidetector CT imaging of the abdomen and pelvis was performed following the standard protocol during bolus administration of intravenous contrast.  Contrast: 80mL OMNIPAQUE IOHEXOL 300 MG/ML  SOLN  Comparison: None.  Findings: The liver, gallbladder, spleen, pancreas, and adrenal glands are normal appearance.  Small left renal cyst is noted, but there is no evidence of renal mass or hydronephrosis.  No soft tissue masses or lymphadenopathy identified within the abdomen or pelvis. Mildly enlarged prostate noted, however bladder is nondilated.  No evidence of inflammatory process or abnormal fluid collections. No evidence of inflammatory process or abnormal fluid collections. Severe lumbar  spine degenerative changes and levoscoliosis noted.  IMPRESSION:  1.  No acute findings. 2.  Mild enlarged prostate. 3.  Advanced lumbar spondylosis and levoscoliosis.  Original Report Authenticated By: Danae Orleans, M.D.   Dg Chest Port 1 View  07/24/2011  *RADIOLOGY REPORT*  Clinical Data: Nausea, vomiting, weakness  PORTABLE CHEST - 1 VIEW  Comparison: Chest x-ray of 06/08/2011  Findings: The lungs are clear.  The heart is within normal limits in size.  There is a mild thoracic scoliosis convex to the right.  IMPRESSION: No active lung disease.  Mild thoracic scoliosis.  Original Report Authenticated By: Juline Patch, M.D.    Assessment  Calvin Burnham comes in with dehydration in setting of Multiple Myeloma s/p chemotherapy, ?failing therapy. He seems to have gastritis ?related to chemo. He does not seem to have infection at this point.  Plan  .Abdominal pain, acute/Nausea and vomiting in adult - ?gastritis. Admit for ivf/analgesics/antiemetics/ppi. Marland KitchenAKI (acute kidney injury)- may be prerenal. Rehydrate. Check urine sodium.  Marland KitchenHTN (hypertension)-  seems controlled. .Anemia- due to multiple myeloma. .Dehydration- due to poor oral intake. .Anorexia- s/p chemo .Multiple Myeloma- will inform Dr Cyndie Chime of patient's admission in am dvt prophylaxis. Condition closely guarded.    Shonta Phillis [phone] 07/24/2011, 7:20 PM

## 2011-07-24 NOTE — ED Notes (Signed)
Patient transported to CT 

## 2011-07-25 ENCOUNTER — Telehealth: Payer: Self-pay | Admitting: *Deleted

## 2011-07-25 ENCOUNTER — Encounter (HOSPITAL_COMMUNITY): Payer: Self-pay | Admitting: *Deleted

## 2011-07-25 DIAGNOSIS — D649 Anemia, unspecified: Secondary | ICD-10-CM

## 2011-07-25 DIAGNOSIS — R197 Diarrhea, unspecified: Secondary | ICD-10-CM

## 2011-07-25 DIAGNOSIS — C9000 Multiple myeloma not having achieved remission: Secondary | ICD-10-CM

## 2011-07-25 LAB — DIFFERENTIAL
Basophils Relative: 1 % (ref 0–1)
Eosinophils Absolute: 0.1 10*3/uL (ref 0.0–0.7)
Eosinophils Relative: 2 % (ref 0–5)
Lymphocytes Relative: 17 % (ref 12–46)
Monocytes Absolute: 0.9 10*3/uL (ref 0.1–1.0)
Neutro Abs: 4.8 10*3/uL (ref 1.7–7.7)
Neutrophils Relative %: 68 % (ref 43–77)

## 2011-07-25 LAB — PREPARE RBC (CROSSMATCH)

## 2011-07-25 LAB — CBC
HCT: 20 % — ABNORMAL LOW (ref 39.0–52.0)
Hemoglobin: 7 g/dL — ABNORMAL LOW (ref 13.0–17.0)
MCH: 31.5 pg (ref 26.0–34.0)
MCHC: 35 g/dL (ref 30.0–36.0)
RBC: 2.22 MIL/uL — ABNORMAL LOW (ref 4.22–5.81)

## 2011-07-25 LAB — TSH: TSH: 1.019 u[IU]/mL (ref 0.350–4.500)

## 2011-07-25 LAB — PROTIME-INR: Prothrombin Time: 15.8 seconds — ABNORMAL HIGH (ref 11.6–15.2)

## 2011-07-25 LAB — FOLATE: Folate: >20 ng/mL

## 2011-07-25 LAB — MAGNESIUM: Magnesium: 0.7 mg/dL — CL (ref 1.5–2.5)

## 2011-07-25 LAB — VITAMIN B12: Vitamin B-12: 486 pg/mL (ref 211–911)

## 2011-07-25 LAB — RETICULOCYTES
RBC.: 2.22 MIL/uL — ABNORMAL LOW (ref 4.22–5.81)
Retic Ct Pct: 1 % (ref 0.4–3.1)

## 2011-07-25 MED ORDER — MAGNESIUM SULFATE 40 MG/ML IJ SOLN
4.0000 g | Freq: Once | INTRAMUSCULAR | Status: AC
  Administered 2011-07-25: 4 g via INTRAVENOUS
  Filled 2011-07-25: qty 100

## 2011-07-25 MED ORDER — MAGNESIUM SULFATE 40 MG/ML IJ SOLN
2.0000 g | Freq: Once | INTRAMUSCULAR | Status: DC
  Filled 2011-07-25: qty 50

## 2011-07-25 MED ORDER — CIPROFLOXACIN IN D5W 400 MG/200ML IV SOLN
400.0000 mg | Freq: Two times a day (BID) | INTRAVENOUS | Status: DC
  Administered 2011-07-25 – 2011-07-27 (×4): 400 mg via INTRAVENOUS
  Filled 2011-07-25 (×5): qty 200

## 2011-07-25 MED ORDER — MAGNESIUM SULFATE 40 MG/ML IJ SOLN
2.0000 g | Freq: Once | INTRAMUSCULAR | Status: AC
  Administered 2011-07-25: 2 g via INTRAVENOUS
  Filled 2011-07-25: qty 50

## 2011-07-25 MED ORDER — METRONIDAZOLE IN NACL 5-0.79 MG/ML-% IV SOLN
500.0000 mg | Freq: Three times a day (TID) | INTRAVENOUS | Status: DC
  Administered 2011-07-25 – 2011-07-27 (×6): 500 mg via INTRAVENOUS
  Filled 2011-07-25 (×8): qty 100

## 2011-07-25 NOTE — Progress Notes (Signed)
UR done. 

## 2011-07-25 NOTE — Progress Notes (Signed)
Pt with mg of 0.7 and hg of 7 mid level called awaiting call back.

## 2011-07-25 NOTE — Progress Notes (Signed)
Appreciate hemeonc. Mr birdwell says he feels slightly better but is having copious diarrhea.  1. Dehydration   2. Multiple myeloma without mention of remission   3. Diarrhea   4. IgG monoclonal gammopathy   5. Multiple myeloma not having achieved remission   6. Anemia   7. Abdominal pain, acute   8. Anorexia   9. Chronic renal insufficiency, stage III (moderate)   10. Nausea and vomiting in adult     Past Medical History  Diagnosis Date  . Anemia 11/27/2010  . Hypertension, benign essential, goal below 140/90 11/27/2010  . Multiple myeloma without mention of remission 12/12/2010  . Multiple myeloma without mention of remission 12/12/2010  . CKD (chronic kidney disease), stage III 02/05/2011  . Hyperlipemia   . Diarrhea 06/17/2011    Hospital admission 05/30/11 velcade toxicity? Infectious?  . Chronic renal insufficiency, stage III (moderate) 06/17/2011    Due to myeloma & HTN   Current Facility-Administered Medications  Medication Dose Route Frequency Provider Last Rate Last Dose  . acetaminophen (TYLENOL) tablet 650 mg  650 mg Oral Q6H PRN Tayla Panozzo, MD       Or  . acetaminophen (TYLENOL) suppository 650 mg  650 mg Rectal Q6H PRN Shunsuke Granzow, MD      . ciprofloxacin (CIPRO) IVPB 400 mg  400 mg Intravenous Q12H Rollene Fare, PHARMD   400 mg at 07/25/11 1416  . dextrose 5 %-0.9 % sodium chloride infusion   Intravenous Continuous Taran Hable, MD 75 mL/hr at 07/25/11 0624    . iohexol (OMNIPAQUE) 300 MG/ML solution 100 mL  100 mL Intravenous Once PRN Medication Radiologist, MD   80 mL at 07/24/11 1643  . magnesium sulfate IVPB 4 g 100 mL  4 g Intravenous Once Leanne Chang, NP   4 g at 07/25/11 0617  . metoCLOPramide (REGLAN) injection 10 mg  10 mg Intravenous Once Sheriff Rodenberg, MD   10 mg at 07/24/11 1939  . metroNIDAZOLE (FLAGYL) IVPB 500 mg  500 mg Intravenous Q8H Marlissa Emerick, MD   500 mg at 07/25/11 1246  . morphine 2 MG/ML injection 2 mg  2 mg Intravenous Q4H PRN  Derriona Branscom, MD      . ondansetron (ZOFRAN) tablet 4 mg  4 mg Oral Q6H PRN Shadeed Colberg, MD       Or  . ondansetron (ZOFRAN) injection 4 mg  4 mg Intravenous Q6H PRN Athan Casalino, MD      . pantoprazole (PROTONIX) injection 40 mg  40 mg Intravenous Q12H Anthonie Lotito, MD   40 mg at 07/25/11 0931  . traMADol (ULTRAM) tablet 50 mg  50 mg Oral Q6H PRN Nija Koopman, MD      . DISCONTD: 0.9 %  sodium chloride infusion  1,000 mL Intravenous Continuous Celene Kras, MD   1,000 mL at 07/24/11 1623  . DISCONTD: 0.9 %  sodium chloride infusion  1,000 mL Intravenous Continuous Elmer Merwin, MD      . DISCONTD: dexamethasone (DECADRON) tablet 20 mg  20 mg Oral Q7 days Ziyah Cordoba, MD      . DISCONTD: folic acid (FOLVITE) tablet 1 mg  1 mg Oral Daily Bronislaus Verdell, MD   1 mg at 07/25/11 0931  . DISCONTD: megestrol (MEGACE) tablet 40 mg  40 mg Oral BID Analeise Mccleery, MD   40 mg at 07/25/11 0931  . DISCONTD: multivitamin with minerals tablet 1 tablet  1 tablet Oral Daily Torrin Crihfield, MD   1 tablet  at 07/25/11 0931  . DISCONTD: zolpidem (AMBIEN) tablet 5 mg  5 mg Oral QHS PRN Conley Canal, MD       Allergies  Allergen Reactions  . Lactose Intolerance (Gi) Nausea And Vomiting   Active Problems:  Abdominal pain, acute  Nausea and vomiting in adult  AKI (acute kidney injury)  HTN (hypertension)  Anemia  Dehydration  Anorexia   Vital signs in last 24 hours: Temp:  [98.2 F (36.8 C)-99.1 F (37.3 C)] 98.4 F (36.9 C) (07/18 1520) Pulse Rate:  [64-101] 98  (07/18 1520) Resp:  [17-23] 18  (07/18 1520) BP: (142-158)/(60-87) 150/70 mmHg (07/18 1520) SpO2:  [99 %-100 %] 100 % (07/18 1445) Weight:  [66.225 kg (146 lb)] 66.225 kg (146 lb) (07/17 2219) Weight change:  Last BM Date: 07/25/11  Intake/Output from previous day: 07/17 0701 - 07/18 0700 In: 615 [P.O.:240; I.V.:375] Out: 1452 [Urine:1450; Stool:2] Intake/Output this shift: Total I/O In: 360 [P.O.:360] Out: 800  [Urine:800]  Lab Results:  Basename 07/25/11 0320 07/24/11 1405  WBC 7.1 11.2*  HGB 7.0* 8.7*  HCT 20.0* 25.2*  PLT 199 257   BMET  Basename 07/24/11 1405  NA 138  K 3.6  CL 105  CO2 16*  GLUCOSE 112*  BUN 20  CREATININE 1.89*  CALCIUM 7.0*    Studies/Results: Ct Abdomen Pelvis W Contrast  07/24/2011  *RADIOLOGY REPORT*  Clinical Data: Diffuse abdominal pain.  Nausea and vomiting. Diarrhea.  CT ABDOMEN AND PELVIS WITH CONTRAST  Technique:  Multidetector CT imaging of the abdomen and pelvis was performed following the standard protocol during bolus administration of intravenous contrast.  Contrast: 80mL OMNIPAQUE IOHEXOL 300 MG/ML  SOLN  Comparison: None.  Findings: The liver, gallbladder, spleen, pancreas, and adrenal glands are normal appearance.  Small left renal cyst is noted, but there is no evidence of renal mass or hydronephrosis.  No soft tissue masses or lymphadenopathy identified within the abdomen or pelvis. Mildly enlarged prostate noted, however bladder is nondilated.  No evidence of inflammatory process or abnormal fluid collections. No evidence of inflammatory process or abnormal fluid collections. Severe lumbar spine degenerative changes and levoscoliosis noted.  IMPRESSION:  1.  No acute findings. 2.  Mild enlarged prostate. 3.  Advanced lumbar spondylosis and levoscoliosis.  Original Report Authenticated By: Danae Orleans, M.D.   Dg Chest Port 1 View  07/24/2011  *RADIOLOGY REPORT*  Clinical Data: Nausea, vomiting, weakness  PORTABLE CHEST - 1 VIEW  Comparison: Chest x-ray of 06/08/2011  Findings: The lungs are clear.  The heart is within normal limits in size.  There is a mild thoracic scoliosis convex to the right.  IMPRESSION: No active lung disease.  Mild thoracic scoliosis.  Original Report Authenticated By: Juline Patch, M.D.    Medications: I have reviewed the patient's current medications.   Physical exam GENERAL- alert HEAD- normal atraumatic, no neck  masses, normal thyroid, no jvd RESPIRATORY- appears well, vitals normal, no respiratory distress, acyanotic, normal RR, ear and throat exam is normal, neck free of mass or lymphadenopathy, chest clear, no wheezing, crepitations, rhonchi, normal symmetric air entry CVS- regular rate and rhythm, S1, S2 normal, no murmur, click, rub or gallop ABDOMEN- abdomen is soft without significant tenderness, masses, organomegaly or guarding NEURO- Grossly normal EXTREMITIES- extremities normal, atraumatic, no cyanosis or edema  Plan   .Abdominal pain, acute/Nausea and vomiting/diarrhea in adult - Agree with ruling out infection/sigmodoiscopy if not better. Will check stool culture/c.diff/ova and parasites. Cover empirically with flagyl/cirpo.  Continue rehydration. If c.diff negative, try imodium. Marland KitchenAKI (acute kidney injury)- may be prerenal. Rehydrate. Check urine sodium.  Marland KitchenHTN (hypertension)- seems controlled. .Anemia- will transfuse 2 units prbc.Marland Kitchen Marland KitchenDehydration- due to poor oral intake. .Anorexia- s/p chemo  .Multiple Myeloma- appreciate  Dr Cyndie Chime input.dvt prophylaxis.  Condition remainsclosely guarded.     Dodge Ator 07/25/2011 3:31 PM Pager: [phone].

## 2011-07-25 NOTE — ED Provider Notes (Signed)
  Physical Exam  BP 158/87  Pulse 64  Temp 98.9 F (37.2 C) (Oral)  Resp 20  Ht 5\' 11"  (1.803 m)  Wt 146 lb (66.225 kg)  BMI 20.36 kg/m2  SpO2 100%  Physical Exam  ED Course  Procedures  MDM Received patient in signout from Dr. Lynelle Doctor. Negative CT. Patient continues with weakness. He'll be admitted to medicine.      Juliet Rude. Rubin Payor, MD 07/25/11 [phone]

## 2011-07-25 NOTE — Progress Notes (Signed)
ANTIBIOTIC CONSULT NOTE - INITIAL  Pharmacy Consult for Cipro Indication: Rule out diverticulitis  Allergies  Allergen Reactions  . Lactose Intolerance (Gi) Nausea And Vomiting    Patient Measurements: Height: 5\' 11"  (180.3 cm) Weight: 146 lb (66.225 kg) IBW/kg (Calculated) : 75.3   Vital Signs: Temp: 98.2 F (36.8 C) (07/18 0450) Temp src: Oral (07/18 0450) BP: 142/75 mmHg (07/18 0450) Pulse Rate: 69  (07/18 0450) Intake/Output from previous day: 07/17 0701 - 07/18 0700 In: 615 [P.O.:240; I.V.:375] Out: 1452 [Urine:1450; Stool:2] Intake/Output from this shift: Total I/O In: 240 [P.O.:240] Out: 200 [Urine:200]  Labs:  [facility] 07/25/11 0320 07/24/11 1405  WBC 7.1 11.2*  HGB 7.0* 8.7*  PLT 199 257  LABCREA -- --  CREATININE -- 1.89*   Estimated Creatinine Clearance: 35 ml/min (by C-G formula based on Cr of 1.89).   Microbiology: Recent Results (from the past 720 hour(s))  TECHNOLOGIST REVIEW     Status: Normal   Collection Time   07/15/11  9:37 AM      Component Value Range Status Comment   Technologist Review Rare Metas and Myelocytes present   Final     Medical History: Past Medical History  Diagnosis Date  . Anemia 11/27/2010  . Hypertension, benign essential, goal below 140/90 11/27/2010  . Multiple myeloma without mention of remission 12/12/2010  . Multiple myeloma without mention of remission 12/12/2010  . CKD (chronic kidney disease), stage III 02/05/2011  . Hyperlipemia   . Diarrhea 06/17/2011    Hospital admission 05/30/11 velcade toxicity? Infectious?  . Chronic renal insufficiency, stage III (moderate) 06/17/2011    Due to myeloma & HTN    Medications:  Scheduled:    . sodium chloride  1,000 mL Intravenous Once  . dexamethasone  20 mg Oral Q7 days  . magnesium sulfate 1 - 4 g bolus IVPB  4 g Intravenous Once  . metoCLOPramide (REGLAN) injection  10 mg Intravenous Once  . metronidazole  500 mg Intravenous Q8H  .  morphine injection  4 mg  Intravenous Once  . ondansetron      . ondansetron (ZOFRAN) IV  4 mg Intravenous Once  . pantoprazole (PROTONIX) IV  40 mg Intravenous Q12H  . DISCONTD: folic acid  1 mg Oral Daily  . DISCONTD: megestrol  40 mg Oral BID  . DISCONTD: multivitamin with minerals  1 tablet Oral Daily   Infusions:    . dextrose 5 % and 0.9% NaCl 75 mL/hr at 07/25/11 0624  . DISCONTD: sodium chloride Stopped (07/24/11 1935)  . DISCONTD: sodium chloride     PRN: acetaminophen, acetaminophen, iohexol, morphine injection, ondansetron (ZOFRAN) IV, ondansetron, traMADol, DISCONTD: zolpidem Assessment:  60 YOM with multiple myeloma presented with n/v, abdominal pain.  Beginning cipro/flagyl today for empiric cover of GI infection  Scr elevated 1.89, CrCl ~35 ml/min which is borderline for q12h vs q24h dosing interval.  Goal of Therapy:  Appropriate dosage for renal function  Plan:   Cipro 400mg  IV q12h  Adjust to q24h if Scr worsens  Follow up C.diff and stool cultures  Loralee Pacas, PharmD, BCPS Pager: [phone] 07/25/2011,12:07 PM

## 2011-07-25 NOTE — Progress Notes (Signed)
INITIAL ADULT NUTRITION ASSESSMENT Date: 07/25/2011   Time: 4:26 PM Reason for Assessment: Nutrition risk   INTERVENTION: Ensure Complete BID once diet advanced. Recommend MD consider appetite stimulant. Mouth rinses per MD. Recommend adding Florastor. Will monitor.   ASSESSMENT: Male 66 y.o.  Dx: Abdominal pain/nausea/vomiting/generalized weakness for a few days  Food/Nutrition Related Hx: Pt admitted with abdominal pain, nausea, vomiting, and generalized weakness for the past few days. Pt with multiple myeloma s/p chemotherapy, last treatment in June. Pt reports he hasn't eaten well in a long time r/t not having an appetite and having taste changes with nothing tasting good. Pt states he tries rinsing his mouth at home, but it doesn't help. Pt denies any metallic taste. Pt reports his wife does the cooking. Pt reports nausea improved. Past records indicate pt has lost 51 pounds unintentionally since January 2013. Pt states he is not on any nutritional supplements at home.   Hx:  Past Medical History  Diagnosis Date  . Anemia 11/27/2010  . Hypertension, benign essential, goal below 140/90 11/27/2010  . Multiple myeloma without mention of remission 12/12/2010  . Multiple myeloma without mention of remission 12/12/2010  . CKD (chronic kidney disease), stage III 02/05/2011  . Hyperlipemia   . Diarrhea 06/17/2011    Hospital admission 05/30/11 velcade toxicity? Infectious?  . Chronic renal insufficiency, stage III (moderate) 06/17/2011    Due to myeloma & HTN   Related Meds:  Scheduled Meds:   . ciprofloxacin  400 mg Intravenous Q12H  . magnesium sulfate 1 - 4 g bolus IVPB  2 g Intravenous Once  . magnesium sulfate 1 - 4 g bolus IVPB  2 g Intravenous Once  . magnesium sulfate 1 - 4 g bolus IVPB  4 g Intravenous Once  . metoCLOPramide (REGLAN) injection  10 mg Intravenous Once  . metronidazole  500 mg Intravenous Q8H  . pantoprazole (PROTONIX) IV  40 mg Intravenous Q12H  . DISCONTD:  dexamethasone  20 mg Oral Q7 days  . DISCONTD: folic acid  1 mg Oral Daily  . DISCONTD: megestrol  40 mg Oral BID  . DISCONTD: multivitamin with minerals  1 tablet Oral Daily   Continuous Infusions:   . dextrose 5 % and 0.9% NaCl 75 mL/hr at 07/25/11 0624  . DISCONTD: sodium chloride Stopped (07/24/11 1935)  . DISCONTD: sodium chloride     PRN Meds:.acetaminophen, acetaminophen, iohexol, morphine injection, ondansetron (ZOFRAN) IV, ondansetron, traMADol, DISCONTD: zolpidem  Ht: 5\' 11"  (180.3 cm)  Wt: 146 lb (66.225 kg)  Ideal Wt: 172 lb % Ideal Wt: 85  Usual Wt: 197 lb in January 2013 % Usual Wt: 74  Wt Readings from Last 10 Encounters:  07/24/11 146 lb (66.225 kg)  07/15/11 146 lb 12.8 oz (66.588 kg)  06/21/11 150 lb (68.04 kg)  06/17/11 154 lb 14.4 oz (70.262 kg)  05/31/11 160 lb (72.576 kg)  05/13/11 185 lb 4.8 oz (84.052 kg)  04/08/11 176 lb 1.6 oz (79.878 kg)  03/11/11 190 lb 12.8 oz (86.546 kg)  03/03/11 198 lb (89.812 kg)  02/05/11 197 lb 12.8 oz (89.721 kg)    Body mass index is 20.36 kg/(m^2).   Labs:  CMP     Component Value Date/Time   NA 138 07/24/2011 1405   K 3.6 07/24/2011 1405   CL 105 07/24/2011 1405   CO2 16* 07/24/2011 1405   GLUCOSE 112* 07/24/2011 1405   BUN 20 07/24/2011 1405   CREATININE 1.89* 07/24/2011 1405   CREATININE 2.03* 12/14/2010  1114   CALCIUM 7.0* 07/24/2011 1405   PROT 5.3* 07/24/2011 1405   ALBUMIN 2.6* 07/24/2011 1405   AST 18 07/24/2011 1405   ALT 39 07/24/2011 1405   ALKPHOS 97 07/24/2011 1405   BILITOT 0.9 07/24/2011 1405   GFRNONAA 35* 07/24/2011 1405   GFRAA 40* 07/24/2011 1405    Intake/Output Summary (Last 24 hours) at 07/25/11 1642 Last data filed at 07/25/11 1600  Gross per 24 hour  Intake 2037.5 ml  Output   2252 ml  Net -214.5 ml   Last BM - 07/25/11 diarrhea   Diet Order: Clear Liquid   IVF:    dextrose 5 % and 0.9% NaCl Last Rate: 75 mL/hr at 07/25/11 6213  DISCONTD: sodium chloride Last Rate: Stopped  (07/24/11 1935)  DISCONTD: sodium chloride     Estimated Nutritional Needs:   Kcal:2000-2300 Protein:80-100g Fluid:2-2.3L  NUTRITION DIAGNOSIS: -Inadequate oral intake (NI-2.1).  Status: Ongoing -Pt meets criteria for severe PCM of chronic illness AEB 26% weight loss in the past 7 months with <75% estimated energy intake for the past month per pt report   RELATED TO: multiple myeloma, lack of appetite, taste changes  AS EVIDENCE BY: pt statement, weight loss PTA  MONITORING/EVALUATION(Goals): Advance diet as tolerated to regular diet  EDUCATION NEEDS: -Education needs addressed - discussed nutrition therapy for taste changes and provided handout of this information. Provided [facility] [address] contact information.    Dietitian #: [phone]  DOCUMENTATION CODES Per approved criteria  -Severe malnutrition in the context of chronic illness    Marshall Cork 07/25/2011, 4:26 PM

## 2011-07-25 NOTE — Progress Notes (Signed)
Progress Note: Hematology oncology  Subjective: Recurrent crampy lower abdominal pain and diarrhea in a 66 year old man underactive treatment for multiple myeloma.  66 year old man who presented with progressive renal insufficiency and anemia in November 2012 as the first sign of IgG kappa multiple myeloma. He had a initial significant response to treatment with combination of Velcade, Revlimid, and dexamethasone with improvement in his hemoglobin and renal function. He had no GI toxicity with this regimen. He was admitted here in May with acute onset of nausea vomiting and diarrhea. Hospital course complicated by urinary retention. No specific findings on exam or radiographic studies at that time. His outpatient a chemotherapy program was resumed when he was otherwise stable after discharge. At time of recent office followup visit on July 8, he reported that his bowel movements were now almost back to normal. I noted that he had lost a dramatic amount of weight almost 30 pounds since may. Once again there were no specific findings on his exam. I elected to stop his Velcade since this drug can cause GI toxicity. He continued on weekly dexamethasone 40 mg, and Revlimid 10 mg 14 days on 7 days off. He is currently on a 7 day treatment break.  He now presents with a approximate one week history of recurrent lower abdominal crampy pain and profuse diarrhea. Yesterday he also started to vomit. He presented to the emergency Department with these complaints and was admitted for further evaluation. A CT scan of the abdomen and pelvis done in the ED shows no obvious pathology. No sign of infection, abscess, fluid. He is afebrile, chest radiograph without infiltrate.     Vitals: Filed Vitals:   07/25/11 0450  BP: 142/75  Pulse: 69  Temp: 98.2 F (36.8 C)  Resp: 17   Wt Readings from Last 3 Encounters:  07/24/11 146 lb (66.225 kg)  07/15/11 146 lb 12.8 oz (66.588 kg)  06/21/11 150 lb (68.04 kg)      PHYSICAL EXAM:  General chronically ill-appearing  Head: Normal Eyes: Arcus senilis Throat: No erythema or exudate Neck: Full range of motion Lymph Nodes: No adenopathy Lungs: Clear to auscultation Breasts:  Cardiac: Bigeminal rhythm no murmur Abdominal: Normal bowel sounds, soft, nontender, no mass, no organomegaly Extremities: No edema, no calf tenderness Vascular:  No cyanosis Neurologic no focal deficit Skin: No rash or ecchymosis  Labs:   Basename 07/25/11 0320 07/24/11 1405  WBC 7.1 11.2*  HGB 7.0* 8.7*  HCT 20.0* 25.2*  PLT 199 257    Basename 07/24/11 1405  NA 138  K 3.6  CL 105  CO2 16*  GLUCOSE 112*  BUN 20  CREATININE 1.89*  CALCIUM 7.0*      Images Studies/Results:   Ct Abdomen Pelvis W Contrast  07/24/2011  *RADIOLOGY REPORT*  Clinical Data: Diffuse abdominal pain.  Nausea and vomiting. Diarrhea.  CT ABDOMEN AND PELVIS WITH CONTRAST  Technique:  Multidetector CT imaging of the abdomen and pelvis was performed following the standard protocol during bolus administration of intravenous contrast.  Contrast: 80mL OMNIPAQUE IOHEXOL 300 MG/ML  SOLN  Comparison: None.  Findings: The liver, gallbladder, spleen, pancreas, and adrenal glands are normal appearance.  Small left renal cyst is noted, but there is no evidence of renal mass or hydronephrosis.  No soft tissue masses or lymphadenopathy identified within the abdomen or pelvis. Mildly enlarged prostate noted, however bladder is nondilated.  No evidence of inflammatory process or abnormal fluid collections. No evidence of inflammatory process or abnormal fluid collections. Severe  lumbar spine degenerative changes and levoscoliosis noted.  IMPRESSION:  1.  No acute findings. 2.  Mild enlarged prostate. 3.  Advanced lumbar spondylosis and levoscoliosis.  Original Report Authenticated By: Danae Orleans, M.D.   Dg Chest Port 1 View  07/24/2011  *RADIOLOGY REPORT*  Clinical Data: Nausea, vomiting, weakness   PORTABLE CHEST - 1 VIEW  Comparison: Chest x-ray of 06/08/2011  Findings: The lungs are clear.  The heart is within normal limits in size.  There is a mild thoracic scoliosis convex to the right.  IMPRESSION: No active lung disease.  Mild thoracic scoliosis.  Original Report Authenticated By: Juline Patch, M.D.     Patient Active Problem List  Diagnosis  . IgG monoclonal gammopathy  . Multiple myeloma without mention of remission  . CKD (chronic kidney disease), stage III  . Diarrhea  . Chronic renal insufficiency, stage III (moderate)  . Abdominal pain, acute  . Nausea and vomiting in adult  . AKI (acute kidney injury)  . HTN (hypertension)  . Anemia  . Dehydration  . Anorexia    Assessment and Plan:  #1. Recurrent lower abdominal pain and diarrhea I do not think this is related either to his underlying myeloma or the treatment. I would consider diverticulitis or infectious diarrhea in my differential. Recommendations: I agree with hydration, electrolyte replacement , n.p.o. except clear liquids, I would check stool cultures, C. difficile, stool for white blood cells. I would hold all nonessential oral medications. If above unrevealing and symptoms persist I would ask gastroenterology to evaluate the patient for a flexible sigmoidoscopy  #2. IgG kappa multiple myeloma He has been responding to treatment to date. He has had a fall in his hemoglobin and rise in his creatinine concomitant with his GI symptoms and need to hold or delay his treatments. I will reinstitute treatment when he is otherwise stable.  #3. Chronic renal insufficiency multifactorial due to previous long-standing hypertension and subsequent renal damage from myeloma. Initial significant improvement with anti-myeloma treatment. Although current creatinine is slightly worse than his best value of 1. 3 back in may, it is still better than his pretreatment value of 2.3 in November 2012. Some of the recent  deterioration in his renal function is clearly due to an element of dehydration.  #4. Anemia. Again this is multifactorial due to underlying myeloma and recent acute illness. MCV is normal so this is not an iron deficiency problem. I would use transfusion therapy on a when necessary basis for hemoglobin 7 or below.  GRANFORTUNA,JAMES M 07/25/2011, 7:41 AM

## 2011-07-25 NOTE — Telephone Encounter (Signed)
Received call this am stating pt was admitted & reports that he didn't take his weekly decadron on mon & she wants to know if this should be continued in the hosp.   Call returned & notified Pharmacist/Coleen per Dr Cyndie Chime to hold for now.  Revlimid also on hold & notified [facility].

## 2011-07-26 ENCOUNTER — Encounter (HOSPITAL_COMMUNITY)
Admission: EM | Disposition: A | Discharge status: Home Health | Payer: Self-pay | Source: Home / Self Care | Attending: Internal Medicine

## 2011-07-26 ENCOUNTER — Encounter (HOSPITAL_COMMUNITY): Payer: Self-pay

## 2011-07-26 DIAGNOSIS — E876 Hypokalemia: Secondary | ICD-10-CM

## 2011-07-26 HISTORY — PX: COLONOSCOPY: SHX5424

## 2011-07-26 LAB — CBC
HCT: 27.8 % — ABNORMAL LOW (ref 39.0–52.0)
Hemoglobin: 9.8 g/dL — ABNORMAL LOW (ref 13.0–17.0)
MCH: 31 pg (ref 26.0–34.0)
MCV: 88 fL (ref 78.0–100.0)
RBC: 3.16 MIL/uL — ABNORMAL LOW (ref 4.22–5.81)

## 2011-07-26 LAB — COMPREHENSIVE METABOLIC PANEL
BUN: 7 mg/dL (ref 6–23)
CO2: 20 mEq/L (ref 19–32)
Calcium: 6.4 mg/dL — CL (ref 8.4–10.5)
Creatinine, Ser: 1.05 mg/dL (ref 0.50–1.35)
GFR calc Af Amer: 82 mL/min — ABNORMAL LOW (ref >90)
GFR calc non Af Amer: 71 mL/min — ABNORMAL LOW (ref >90)
Glucose, Bld: 89 mg/dL (ref 70–99)
Total Bilirubin: 0.7 mg/dL (ref 0.3–1.2)

## 2011-07-26 LAB — TYPE AND SCREEN: Unit division: 0

## 2011-07-26 LAB — MAGNESIUM: Magnesium: 1.2 mg/dL — ABNORMAL LOW (ref 1.5–2.5)

## 2011-07-26 SURGERY — COLONOSCOPY
Anesthesia: Moderate Sedation

## 2011-07-26 MED ORDER — POTASSIUM CHLORIDE 10 MEQ/100ML IV SOLN
10.0000 meq | Freq: Once | INTRAVENOUS | Status: AC
  Administered 2011-07-27: 10 meq via INTRAVENOUS
  Filled 2011-07-26: qty 100

## 2011-07-26 MED ORDER — POTASSIUM CHLORIDE 10 MEQ/100ML IV SOLN
10.0000 meq | INTRAVENOUS | Status: AC
  Administered 2011-07-26 (×3): 10 meq via INTRAVENOUS
  Filled 2011-07-26 (×4): qty 100

## 2011-07-26 MED ORDER — FENTANYL CITRATE 0.05 MG/ML IJ SOLN
INTRAMUSCULAR | Status: DC | PRN
  Administered 2011-07-26 (×2): 25 ug via INTRAVENOUS

## 2011-07-26 MED ORDER — FENTANYL CITRATE 0.05 MG/ML IJ SOLN
INTRAMUSCULAR | Status: AC
  Filled 2011-07-26: qty 2

## 2011-07-26 MED ORDER — MIDAZOLAM HCL 10 MG/2ML IJ SOLN
INTRAMUSCULAR | Status: AC
  Filled 2011-07-26: qty 2

## 2011-07-26 MED ORDER — LOPERAMIDE HCL 2 MG PO CAPS
4.0000 mg | ORAL_CAPSULE | Freq: Four times a day (QID) | ORAL | Status: DC
  Administered 2011-07-26 – 2011-07-29 (×9): 4 mg via ORAL
  Filled 2011-07-26 (×7): qty 2
  Filled 2011-07-26: qty 1
  Filled 2011-07-26 (×10): qty 2

## 2011-07-26 MED ORDER — LOPERAMIDE HCL 2 MG PO CAPS: 2.0000 mg | ORAL_CAPSULE | ORAL | Status: DC | PRN

## 2011-07-26 MED ORDER — LOPERAMIDE HCL 2 MG PO CAPS
4.0000 mg | ORAL_CAPSULE | Freq: Once | ORAL | Status: AC
  Administered 2011-07-26: 4 mg via ORAL
  Filled 2011-07-26 (×2): qty 1

## 2011-07-26 MED ORDER — AMLODIPINE BESYLATE 5 MG PO TABS
5.0000 mg | ORAL_TABLET | Freq: Every day | ORAL | Status: DC
  Administered 2011-07-26 – 2011-07-29 (×4): 5 mg via ORAL
  Filled 2011-07-26 (×4): qty 1

## 2011-07-26 MED ORDER — SACCHAROMYCES BOULARDII 250 MG PO CAPS
250.0000 mg | ORAL_CAPSULE | Freq: Two times a day (BID) | ORAL | Status: DC
  Administered 2011-07-26 – 2011-07-29 (×5): 250 mg via ORAL
  Filled 2011-07-26 (×7): qty 1

## 2011-07-26 MED ORDER — MIDAZOLAM HCL 5 MG/5ML IJ SOLN
INTRAMUSCULAR | Status: DC | PRN
  Administered 2011-07-26 (×2): 1 mg via INTRAVENOUS
  Administered 2011-07-26: 2 mg via INTRAVENOUS

## 2011-07-26 MED ORDER — KCL IN DEXTROSE-NACL 20-5-0.9 MEQ/L-%-% IV SOLN
INTRAVENOUS | Status: DC
  Administered 2011-07-26: 12:00:00 via INTRAVENOUS
  Filled 2011-07-26 (×2): qty 1000

## 2011-07-26 MED ORDER — MAGNESIUM SULFATE 40 MG/ML IJ SOLN
2.0000 g | Freq: Once | INTRAMUSCULAR | Status: AC
  Administered 2011-07-26: 2 g via INTRAVENOUS
  Filled 2011-07-26: qty 50

## 2011-07-26 NOTE — Progress Notes (Signed)
Appreciate heme onc follow up. Feels better, but still having diarrhea. I have consulted [facility]. Dr Matthias Hughs will graciously see Mr Calvin Mccormick in consult later today.  1. Dehydration   2. Multiple myeloma without mention of remission   3. Diarrhea   4. IgG monoclonal gammopathy   5. Multiple myeloma not having achieved remission   6. Anemia   7. Abdominal pain, acute   8. Anorexia   9. Chronic renal insufficiency, stage III (moderate)   10. Nausea and vomiting in adult     Past Medical History  Diagnosis Date  . Anemia 11/27/2010  . Hypertension, benign essential, goal below 140/90 11/27/2010  . Multiple myeloma without mention of remission 12/12/2010  . Multiple myeloma without mention of remission 12/12/2010  . CKD (chronic kidney disease), stage III 02/05/2011  . Hyperlipemia   . Diarrhea 06/17/2011    Hospital admission 05/30/11 velcade toxicity? Infectious?  . Chronic renal insufficiency, stage III (moderate) 06/17/2011    Due to myeloma & HTN   Current Facility-Administered Medications  Medication Dose Route Frequency Provider Last Rate Last Dose  . acetaminophen (TYLENOL) tablet 650 mg  650 mg Oral Q6H PRN Nkosi Cortright, MD       Or  . acetaminophen (TYLENOL) suppository 650 mg  650 mg Rectal Q6H PRN Linkin Vizzini Nanako Stopher, MD      . amLODipine (NORVASC) tablet 5 mg  5 mg Oral Daily Lotus Gover, MD      . ciprofloxacin (CIPRO) IVPB 400 mg  400 mg Intravenous Q12H Rollene Fare, PHARMD   400 mg at 07/26/11 0204  . dextrose 5 % and 0.9 % NaCl with KCl 20 mEq/L infusion   Intravenous Continuous Mayling Aber, MD      . loperamide (IMODIUM) capsule 2 mg  2 mg Oral PRN Kileigh Ortmann, MD      . loperamide (IMODIUM) capsule 4 mg  4 mg Oral Once Denyse Fillion, MD      . magnesium sulfate IVPB 2 g 50 mL  2 g Intravenous Once Stepan Verrette, MD   2 g at 07/25/11 1807  . magnesium sulfate IVPB 2 g 50 mL  2 g Intravenous Once Virl Coble, MD      . metroNIDAZOLE (FLAGYL) IVPB 500 mg  500 mg  Intravenous Q8H Jasmyne Lodato, MD   500 mg at 07/26/11 0419  . morphine 2 MG/ML injection 2 mg  2 mg Intravenous Q4H PRN Beckhem Isadore, MD      . ondansetron (ZOFRAN) tablet 4 mg  4 mg Oral Q6H PRN Arvetta Araque, MD       Or  . ondansetron (ZOFRAN) injection 4 mg  4 mg Intravenous Q6H PRN River Mckercher, MD      . pantoprazole (PROTONIX) injection 40 mg  40 mg Intravenous Q12H Shukri Nistler, MD   40 mg at 07/26/11 0950  . potassium chloride 10 mEq in 100 mL IVPB  10 mEq Intravenous Q1 Hr x 4 Roma Kayser Schorr, NP   10 mEq at 07/26/11 0837  . traMADol (ULTRAM) tablet 50 mg  50 mg Oral Q6H PRN Ronny Ruddell, MD      . DISCONTD: dexamethasone (DECADRON) tablet 20 mg  20 mg Oral Q7 days Holy Battenfield, MD      . DISCONTD: dextrose 5 %-0.9 % sodium chloride infusion   Intravenous Continuous Kayven Aldaco, MD 75 mL/hr at 07/26/11 0837    . DISCONTD: magnesium sulfate IVPB 2 g 50 mL  2 g Intravenous Once Shawnn Bouillon  Venetia Constable, MD       Allergies  Allergen Reactions  . Lactose Intolerance (Gi) Nausea And Vomiting   Active Problems:  Abdominal pain, acute  Nausea and vomiting in adult  AKI (acute kidney injury)  HTN (hypertension)  Anemia  Dehydration  Anorexia   Vital signs in last 24 hours: Temp:  [98.4 F (36.9 C)-99.6 F (37.6 C)] 98.9 F (37.2 C) (07/19 0515) Pulse Rate:  [76-101] 76  (07/19 0515) Resp:  [16-20] 16  (07/19 0515) BP: (143-166)/(49-74) 151/49 mmHg (07/19 0515) SpO2:  [99 %-100 %] 100 % (07/19 0515) Weight change:  Last BM Date: 07/26/11  Intake/Output from previous day: 07/18 0701 - 07/19 0700 In: 1960 [P.O.:360; I.V.:600; Blood:700; IV Piggyback:300] Out: 1600 [Urine:1600] Intake/Output this shift:    Lab Results:  Basename 07/26/11 0405 07/25/11 0320  WBC 8.5 7.1  HGB 9.8* 7.0*  HCT 27.8* 20.0*  PLT 209 199   BMET  Basename 07/26/11 0405 07/24/11 1405  NA 134* 138  K 2.7* 3.6  CL 101 105  CO2 20 16*  GLUCOSE 89 112*  BUN 7 20  CREATININE 1.05 1.89*    CALCIUM 6.4* 7.0*    Studies/Results: Ct Abdomen Pelvis W Contrast  07/24/2011  *RADIOLOGY REPORT*  Clinical Data: Diffuse abdominal pain.  Nausea and vomiting. Diarrhea.  CT ABDOMEN AND PELVIS WITH CONTRAST  Technique:  Multidetector CT imaging of the abdomen and pelvis was performed following the standard protocol during bolus administration of intravenous contrast.  Contrast: 80mL OMNIPAQUE IOHEXOL 300 MG/ML  SOLN  Comparison: None.  Findings: The liver, gallbladder, spleen, pancreas, and adrenal glands are normal appearance.  Small left renal cyst is noted, but there is no evidence of renal mass or hydronephrosis.  No soft tissue masses or lymphadenopathy identified within the abdomen or pelvis. Mildly enlarged prostate noted, however bladder is nondilated.  No evidence of inflammatory process or abnormal fluid collections. No evidence of inflammatory process or abnormal fluid collections. Severe lumbar spine degenerative changes and levoscoliosis noted.  IMPRESSION:  1.  No acute findings. 2.  Mild enlarged prostate. 3.  Advanced lumbar spondylosis and levoscoliosis.  Original Report Authenticated By: Danae Orleans, M.D.   Dg Chest Port 1 View  07/24/2011  *RADIOLOGY REPORT*  Clinical Data: Nausea, vomiting, weakness  PORTABLE CHEST - 1 VIEW  Comparison: Chest x-ray of 06/08/2011  Findings: The lungs are clear.  The heart is within normal limits in size.  There is a mild thoracic scoliosis convex to the right.  IMPRESSION: No active lung disease.  Mild thoracic scoliosis.  Original Report Authenticated By: Juline Patch, M.D.    Medications: I have reviewed the patient's current medications.   Physical exam GENERAL- alert, more with it. HEAD- normal atraumatic, no neck masses, normal thyroid, no jvd RESPIRATORY- appears well, vitals normal, no respiratory distress, acyanotic, normal RR, ear and throat exam is normal, neck free of mass or lymphadenopathy, chest clear, no wheezing,  crepitations, rhonchi, normal symmetric air entry CVS- regular rate and rhythm, S1, S2 normal, no murmur, click, rub or gallop ABDOMEN- abdomen is soft without significant tenderness, masses, organomegaly or guarding NEURO- Grossly normal EXTREMITIES- extremities normal, atraumatic, no cyanosis or edema  Plan   .Abdominal pain, acute/Nausea and vomiting/diarrhea in adult - Diarrhea continues. Will add imodium. Consulted Dr Matthias Hughs. Continue ivf, replenish electrolytes. Marland KitchenAKI (acute kidney injury)- prerenal. Resolved with rehydration. Marland KitchenHTN (hypertension)- BP trending up. Start norvasc. Marland KitchenAnemia- responded to transfusion of 2 units prbc. .Dehydration- due to  poor oral intake, gi loss. Improved. .Hypokalemia/hypomagnesemia- due to gi loss. replenish as necessary. .Multiple Myeloma- appreciate Dr Cyndie Chime input. .dvt prophylaxis- scds, ambulate.     Chanler Mendonca 07/26/2011 11:36 AM Pager: [phone].

## 2011-07-26 NOTE — Progress Notes (Signed)
Low-grade temperature 99.6. He was started on IV Flagyl C. difficile negative by PCR; no suspicious colonies to date on the stool cultures; report on over and parasites in the stool not yet available. Routine urine analysis was normal on admission making urinary tract infection unlikely source of his diarrhea. He is receiving magnesium and potassium replacement. There is now a rectal tube in place to do a persistent profuse diarrhea. On exam his abdomen remains soft and nontender with decreased bowel sounds. No new findings. He is tender at site of intravenous line dorsum of the left hand likely due to a potassium infusion. Ferritin elevated due to acute phase reactant and transfusion therapy. Impression: #1. Unexplained secretory diarrhea Recommend GI consultation #2. Hypokalemia/hypomagnesemia replacement in progress #3. Anemia due to acute and chronic illness as well as a fracture of recent chemotherapy for myeloma #4. IgG kappa multiple myeloma. Holding treatment until clinically stable and diarrhea resolves. #5. Multifactorial renal insufficiency hypertensive and related to myeloma with some improvement on recent myeloma treatment. #6. Essential hypertension. Thank you for your assistance in management of this patient

## 2011-07-26 NOTE — Progress Notes (Signed)
CRITICAL VALUE ALERT  Critical value received:  Calcium 6.4, and Potassium 2.7  Date of notification:  07/26/11  Time of notification:  0540  Critical value read back:yes  Nurse who received alert:  Daphene Calamity  MD notified (1st page):  Schorr  Time of first page:  [phone]  MD notified (2nd page):  Time of second page:  Responding MD:    Time MD responded:

## 2011-07-26 NOTE — Progress Notes (Signed)
Flexiseal tube removed per request of Dr. Matthias Hughs for procedure.

## 2011-07-26 NOTE — Progress Notes (Signed)
The patient's colonoscopy this afternoon was completely normal, all the way to the terminal ileum.biopsies are pending, looking for condition such as microscopic colitis which might not be visually evident  The fact that there was essentially no stool present, and certainly no formed stool present, despite the absence of the prep, certainly correlates with his history of severe watery diarrhea.  I have changed the patient's loperamide to a standing order4 times a day, rather than when necessary.  I have also ordered a probiotic for the patient.  We will continue to follow him with you for the time being.  Florencia Reasons, M.D. [phone]

## 2011-07-26 NOTE — Consult Note (Signed)
Referring Provider: Dr. Marcene Duos (hospitalist) Primary Care Physician:  Jeri Cos, MD Primary Gastroenterologist:  unknown  Reason for Consultation:  diarrhea  HPI: Calvin Mccormick is a 66 y.o. male with multiple myeloma, admitted to the hospital 2 days ago with nausea, vomiting, and diarrhea. He indicates that the nausea and vomiting have resolved, and in fact, he's been able to tolerate clear liquids without difficulty and feels ready to be advanced to a solid diet. However, he has had severe diarrhea which is associated with a negative C. Difficile toxin assay and has not responded to medication so far. His diarrhea has been going on for months, and seems to correlate with the institution of his chemotherapy for the multiple myeloma   Past Medical History  Diagnosis Date  . Anemia 11/27/2010  . Hypertension, benign essential, goal below 140/90 11/27/2010  . Multiple myeloma without mention of remission 12/12/2010  . Multiple myeloma without mention of remission 12/12/2010  . CKD (chronic kidney disease), stage III 02/05/2011  . Hyperlipemia   . Diarrhea 06/17/2011    Hospital admission 05/30/11 velcade toxicity? Infectious?  . Chronic renal insufficiency, stage III (moderate) 06/17/2011    Due to myeloma & HTN    History reviewed. No pertinent past surgical history.  Prior to Admission medications   Medication Sig Start Date End Date Taking? Authorizing Provider  allopurinol (ZYLOPRIM) 100 MG tablet Take 100 mg by mouth 2 (two) times daily. 06/17/11  Yes Historical Provider, MD  atorvastatin (LIPITOR) 20 MG tablet Take 20 mg by mouth at bedtime.    Yes Historical Provider, MD  dexamethasone (DECADRON) 4 MG tablet Take 20 mg by mouth every 7 (seven) days. On Mondays.   Yes Historical Provider, MD  esomeprazole (NEXIUM) 40 MG capsule Take 40 mg by mouth daily before breakfast.     Yes Historical Provider, MD  folic acid (FOLVITE) 1 MG tablet Take 1 mg by mouth daily.     Yes Historical  Provider, MD  gabapentin (NEURONTIN) 100 MG capsule Take 100 mg by mouth 2 (two) times daily. 06/17/11  Yes Historical Provider, MD  hydrochlorothiazide (HYDRODIURIL) 25 MG tablet Take 25 mg by mouth daily.   Yes Historical Provider, MD  lenalidomide (REVLIMID) 10 MG capsule Take 1 capsule (10 mg total) by mouth daily. 1 po daily x 14 days, then 7 day break.   AUTH # [id number] Faxed to [facility] [phone] Pt to start 07/10/11 07/04/11  Yes Ladene Artist, MD  megestrol (MEGACE) 40 MG tablet Take 40 mg by mouth 2 (two) times daily. 06/17/11  Yes Historical Provider, MD  ondansetron (ZOFRAN-ODT) 8 MG disintegrating tablet Take 1 tablet (8 mg total) by mouth every 8 (eight) hours as needed. For nausea. 06/11/11  Yes Levert Feinstein, MD  PRESCRIPTION MEDICATION Inject into the vein every 7 (seven) days. Velcade injections.  His oncologist is Dr. Okey Dupre. He receives his treatments on Mondays.   Yes Historical Provider, MD  sildenafil (VIAGRA) 50 MG tablet Take 50 mg by mouth daily as needed. For erectile dysfunction.   Yes Historical Provider, MD  traMADol (ULTRAM) 50 MG tablet Take 50 mg by mouth every 6 (six) hours as needed. For pain. Maximum dose= 8 tablets per day.   Yes Historical Provider, MD  valACYclovir (VALTREX) 500 MG tablet Take 500 mg by mouth daily.   Yes Historical Provider, MD  vitamin E (VITAMIN E) 400 UNIT capsule Take 400 Units by mouth daily.  Yes Historical Provider, MD    Current Facility-Administered Medications  Medication Dose Route Frequency Provider Last Rate Last Dose  . acetaminophen (TYLENOL) tablet 650 mg  650 mg Oral Q6H PRN Simbiso Ranga, MD       Or  . acetaminophen (TYLENOL) suppository 650 mg  650 mg Rectal Q6H PRN Simbiso Ranga, MD      . amLODipine (NORVASC) tablet 5 mg  5 mg Oral Daily Simbiso Ranga, MD   5 mg at 07/26/11 1316  . ciprofloxacin (CIPRO) IVPB 400 mg  400 mg Intravenous Q12H Rollene Fare, PHARMD   400 mg at 07/26/11 0204  .  dextrose 5 % and 0.9 % NaCl with KCl 20 mEq/L infusion   Intravenous Continuous Simbiso Ranga, MD 50 mL/hr at 07/26/11 1203    . loperamide (IMODIUM) capsule 2 mg  2 mg Oral PRN Simbiso Ranga, MD      . loperamide (IMODIUM) capsule 4 mg  4 mg Oral Once Simbiso Ranga, MD   4 mg at 07/26/11 1159  . magnesium sulfate IVPB 2 g 50 mL  2 g Intravenous Once Simbiso Ranga, MD   2 g at 07/25/11 1807  . magnesium sulfate IVPB 2 g 50 mL  2 g Intravenous Once Simbiso Ranga, MD   2 g at 07/26/11 1204  . metroNIDAZOLE (FLAGYL) IVPB 500 mg  500 mg Intravenous Q8H Simbiso Ranga, MD   500 mg at 07/26/11 1412  . morphine 2 MG/ML injection 2 mg  2 mg Intravenous Q4H PRN Simbiso Ranga, MD      . ondansetron (ZOFRAN) tablet 4 mg  4 mg Oral Q6H PRN Simbiso Ranga, MD       Or  . ondansetron (ZOFRAN) injection 4 mg  4 mg Intravenous Q6H PRN Simbiso Ranga, MD      . pantoprazole (PROTONIX) injection 40 mg  40 mg Intravenous Q12H Simbiso Ranga, MD   40 mg at 07/26/11 0950  . potassium chloride 10 mEq in 100 mL IVPB  10 mEq Intravenous Q1 Hr x 4 Leanne Chang, NP   10 mEq at 07/26/11 1210  . traMADol (ULTRAM) tablet 50 mg  50 mg Oral Q6H PRN Simbiso Ranga, MD      . DISCONTD: dextrose 5 %-0.9 % sodium chloride infusion   Intravenous Continuous Simbiso Ranga, MD 75 mL/hr at 07/26/11 0837    . DISCONTD: magnesium sulfate IVPB 2 g 50 mL  2 g Intravenous Once Conley Canal, MD        Allergies as of 07/24/2011 - Review Complete 07/24/2011  Allergen Reaction Noted  . Lactose intolerance (gi) Nausea And Vomiting 05/30/2011    History reviewed. No pertinent family history.  History   Social History  . Marital Status: Married    Spouse Name: N/A    Number of Children: N/A  . Years of Education: N/A   Occupational History  . Not on file.   Social History Main Topics  . Smoking status: Never Smoker   . Smokeless tobacco: Never Used  . Alcohol Use: No  . Drug Use: No  . Sexually Active: Not Currently    Other Topics Concern  . Not on file   Social History Narrative  . No narrative on file    Review of Systems: Positive for:  See history of present illness  Physical Exam: Vital signs in last 24 hours: Temp:  [98.4 F (36.9 C)-99.6 F (37.6 C)] 99.2 F (37.3 C) (07/19 1437) Pulse Rate:  [76-91]  91  (07/19 1437) Resp:  [16-23] 23  (07/19 1640) BP: (143-166)/(49-83) 154/70 mmHg (07/19 1640) SpO2:  [99 %-100 %] 100 % (07/19 1640) Last BM Date: 07/26/11 General:   Alert,  Well-developed, well-nourished, pleasant and cooperative in NAD Head:  Normocephalic and atraumatic. Eyes:  Sclera clear, no icterus.  Mouth:   No ulcerations or lesions.  Oropharynx pink & moist. Lungs:  Clear throughout to auscultation.   No wheezes, crackles, or rhonchi. No evident respiratory distress. Heart:   Somewhat irregular rhythm; no murmurs, clicks, rubs,  or gallops. Abdomen:  Soft, nontender, nontympanitic, and nondistended. No masses, hepatosplenomegaly or ventral hernias noted. quiet bowel sounds, without bruits, guarding, or rebound.   Msk:   Symmetrical without gross deformities. Neurologic:  Alert and coherent;  grossly normal neurologically. Psych:   Alert and cooperative. Slightly distant mood and affect.  Intake/Output from previous day: 07/18 0701 - 07/19 0700 In: 1960 [P.O.:360; I.V.:600; Blood:700; IV Piggyback:300] Out: 1600 [Urine:1600] Intake/Output this shift: Total I/O In: 240 [P.O.:240] Out: 575 [Urine:575]  Lab Results:  [facility] 07/26/11 0405 07/25/11 0320 07/24/11 1405  WBC 8.5 7.1 11.2*  HGB 9.8* 7.0* 8.7*  HCT 27.8* 20.0* 25.2*  PLT 209 199 257   BMET  Basename 07/26/11 0405 07/24/11 1405  NA 134* 138  K 2.7* 3.6  CL 101 105  CO2 20 16*  GLUCOSE 89 112*  BUN 7 20  CREATININE 1.05 1.89*  CALCIUM 6.4* 7.0*   LFT  Basename 07/26/11 0405  PROT 4.7*  ALBUMIN 2.0*  AST 14  ALT 24  ALKPHOS 86  BILITOT 0.7  BILIDIR --  IBILI --   PT/INR  Basename  07/25/11 0320 07/24/11 1405  LABPROT 15.8* 15.1  INR 1.23 1.17    C-Diff Negative by PCR yesterday  Studies/Results: No results found.  Impression: Nonspecific diarrhea, seemingly related to his chemotherapy for multiple myeloma  Plan: Proceed to colonoscopic or sigmoidoscopic evaluation today with random mucosal biopsies to look for treatable causes of diarrhea such as microscopic colitis. The nature, purpose, and risks of the procedure were reviewed with the patient and he is agreeable to proceed   LOS: 2 days   Izekiel Flegel V  07/26/2011, 5:03 PM

## 2011-07-26 NOTE — Interval H&P Note (Signed)
History and Physical Interval Note:  07/26/2011 5:01 PM  Calvin Mccormick  has presented today for Colonoscopy, with the diagnosis of Diarrhea  The various methods of treatment have been discussed with the patient. After consideration of risks, benefits and other options for treatment, the patient has consented to  Procedure(s) (LRB): COLONOSCOPY (N/A) as a surgical intervention .  The patient's history has been reviewed, patient examined, no change in status, stable for surgery.  I have reviewed the patient's chart and labs.  Questions were answered to the patient's satisfaction.     Florencia Reasons

## 2011-07-26 NOTE — Clinical Documentation Improvement (Signed)
MALNUTRITION DOCUMENTATION CLARIFICATION  THIS DOCUMENT IS NOT A PERMANENT PART OF THE MEDICAL RECORD  TO RESPOND TO THE THIS QUERY, FOLLOW THE INSTRUCTIONS BELOW:  1. If needed, update documentation for the patient's encounter via the notes activity.  2. Access this query again and click edit on the In [employer].  3. After updating, or not, click F2 to complete all highlighted (required) fields concerning your review. Select "additional documentation in the medical record" OR "no additional documentation provided".  4. Click Sign note button.  5. The deficiency will fall out of your In Basket *Please let us know if you are not able to complete this workflow by phone or e-mail (listed below).  Please update your documentation within the medical record to reflect your response to this query.                                                                                        07/26/11   Dear Dr. Venetia Constable / Associates,  In a better effort to capture your patient's severity of illness, reflect appropriate length of stay and utilization of resources, a review of the patient medical record has revealed the following indicators.    Based on your clinical judgment, please clarify and document in a progress note and/or discharge summary the clinical condition associated with the following supporting information:  In responding to this query please exercise your independent judgment.  The fact that a query is asked, does not imply that any particular answer is desired or expected.  According to  nutrition consult note on  07/25/2011 ,  patient meets criteria for "severe malnutrition in the context of chronic illness"   If this is an appropriate diagnosis please document, if not please clarify the malnutrition status of patient if known. Thank you  .  Severe Malnutrition    .  Protein Calorie Malnutrition  .  Severe Protein Calorie Malnutrition  Other Condition (please specify)  Cannot  Clinically Determine    Supporting Information: Risk Factors:  multiple myeloma s/p chemotherapy, diarrhea, n/v  Signs & Symptoms: Calvin Mccormick reports he hasn't eaten well in a long time r/t not having an appetite  -Ht:  29ft 11in      Wt: 146lbs  -BMI: 20.36  -Weight  Loss history: --Albumin level:  2.6 -Total Protein: 5.3 -Calcium level:7.0   Treatments: -Ensure Complete BID  -Medications:REGLAN  IV , PROTONIX IV, dextrose 5 % and 0.9% NaCl @ 84ml/h  -Nutrition Consult: 07/25/2011 Calvin Mccormick meets criteria for severe PCM /Severe malnutrition in the context of chronic illness   You may use possible, probable, or suspect with inpatient documentation. possible, probable, suspected diagnoses MUST be documented at the time of discharge  Reviewed: yes  Thank You,  Andy Gauss RN  Clinical Documentation Specialist:  Pager [phone] E-mail [email or web address]  [facility] [place]

## 2011-07-26 NOTE — Op Note (Signed)
[facility] [address] [place], [place]  [address]  COLONOSCOPY PROCEDURE REPORT  PATIENT:  Calvin Mccormick, Calvin Mccormick  MR#:  [id number] BIRTHDATE:  [birth year], 66 yrs. old  GENDER:  male ENDOSCOPIST:  Bernette Redbird, MD REF. BY:   (unassigned); patient of Dr. Cyndie Chime PROCEDURE DATE:  07/26/2011 PROCEDURE:  Colonoscopy with biopsy ASA CLASS: INDICATIONS:  severe, ongoing, watery diarrhea in a multiple myeloma patient MEDICATIONS:   Fentanyl 50 mcg IV, Versed 4 mg IV  DESCRIPTION OF PROCEDURE:   After the risks and benefits and of the procedure were explained, informed consent was obtained. The patient was brought from his hospital room to the [facility] long endoscopy unit. No prep was administered because of the very watery diarrhea. The [id number]) endoscope was introduced through the anus and advanced to the terminal ileum which was intubated for a short distance.  The quality of the prep was excellent, despite the absence of any prep having been administered.  The instrument was then slowly withdrawn as the colon was fully examined.  FINDINGS:  The examination was normal with no endoscopic findings. Specifically, there was a good mucosal vascular pattern, no evidence of colitis or pseudomembranes, no evidence of ischemia or chronic inflammation, no evidence of ulcerations or opportunistic infection. No diverticular disease, polyps, masses, or diverticula were observed. The terminal ileum had a normal appearance, and retroflexion in the rectum was unremarkable.  Random mucosal biopsies were obtained along the length of the colon, excluding the rectum.  COMPLICATIONS:  None  ENDOSCOPIC IMPRESSION: Normal colonoscopy, no source of severe ongoing diarrhea visually evident.  RECOMMENDATIONS: Await pathology results. Continue loperamide in the meantime.  REPEAT EXAM:  No  ______________________________ Bernette Redbird, MD  CC:  n. eSIGNEDMolly Maduro Shamarr Faucett at  07/26/2011 05:39 PM  Elinor Parkinson, [phone]

## 2011-07-27 LAB — CBC
Hemoglobin: 9.7 g/dL — ABNORMAL LOW (ref 13.0–17.0)
MCH: 31.7 pg (ref 26.0–34.0)
MCV: 88.6 fL (ref 78.0–100.0)
Platelets: 205 10*3/uL (ref 150–400)
RDW: 16.3 % — ABNORMAL HIGH (ref 11.5–15.5)
WBC: 8.7 10*3/uL (ref 4.0–10.5)

## 2011-07-27 LAB — COMPREHENSIVE METABOLIC PANEL
AST: 11 U/L (ref 0–37)
BUN: 5 mg/dL — ABNORMAL LOW (ref 6–23)
CO2: 19 mEq/L (ref 19–32)
Calcium: 6.4 mg/dL — CL (ref 8.4–10.5)
Creatinine, Ser: 0.97 mg/dL (ref 0.50–1.35)
GFR calc non Af Amer: 83 mL/min — ABNORMAL LOW (ref >90)

## 2011-07-27 LAB — MAGNESIUM: Magnesium: 1.1 mg/dL — ABNORMAL LOW (ref 1.5–2.5)

## 2011-07-27 MED ORDER — SODIUM CHLORIDE 0.9 % IV SOLN
1.0000 g | Freq: Once | INTRAVENOUS | Status: AC
  Administered 2011-07-27: 1 g via INTRAVENOUS
  Filled 2011-07-27: qty 10

## 2011-07-27 MED ORDER — POTASSIUM CHLORIDE 20 MEQ/15ML (10%) PO LIQD
40.0000 meq | Freq: Once | ORAL | Status: AC
  Administered 2011-07-27: 40 meq via ORAL
  Filled 2011-07-27: qty 30

## 2011-07-27 MED ORDER — MAGNESIUM SULFATE 40 MG/ML IJ SOLN
2.0000 g | Freq: Once | INTRAMUSCULAR | Status: AC
  Administered 2011-07-27: 2 g via INTRAVENOUS
  Filled 2011-07-27: qty 50

## 2011-07-27 NOTE — Progress Notes (Signed)
Appreciate gi follow up. Mr diekman reports no bowel movement today.  1. Dehydration   2. Multiple myeloma without mention of remission   3. Diarrhea   4. IgG monoclonal gammopathy   5. Multiple myeloma not having achieved remission   6. Anemia   7. Abdominal pain, acute   8. Anorexia   9. Chronic renal insufficiency, stage III (moderate)   10. Nausea and vomiting in adult     Past Medical History  Diagnosis Date  . Anemia 11/27/2010  . Hypertension, benign essential, goal below 140/90 11/27/2010  . Multiple myeloma without mention of remission 12/12/2010  . Multiple myeloma without mention of remission 12/12/2010  . CKD (chronic kidney disease), stage III 02/05/2011  . Hyperlipemia   . Diarrhea 06/17/2011    Hospital admission 05/30/11 velcade toxicity? Infectious?  . Chronic renal insufficiency, stage III (moderate) 06/17/2011    Due to myeloma & HTN   Current Facility-Administered Medications  Medication Dose Route Frequency Provider Last Rate Last Dose  . acetaminophen (TYLENOL) tablet 650 mg  650 mg Oral Q6H PRN Garris Melhorn, MD       Or  . acetaminophen (TYLENOL) suppository 650 mg  650 mg Rectal Q6H PRN Melody Savidge, MD      . amLODipine (NORVASC) tablet 5 mg  5 mg Oral Daily Aime Carreras, MD   5 mg at 07/27/11 1018  . calcium gluconate 1 g in sodium chloride 0.9 % 100 mL IVPB  1 g Intravenous Once Tomoko Sandra, MD      . dextrose 5 % and 0.9 % NaCl with KCl 20 mEq/L infusion   Intravenous Continuous Audrea Bolte, MD 50 mL/hr at 07/26/11 1203    . loperamide (IMODIUM) capsule 4 mg  4 mg Oral QID Florencia Reasons, MD   4 mg at 07/27/11 1018  . magnesium sulfate IVPB 2 g 50 mL  2 g Intravenous Once Gayla Benn, MD      . magnesium sulfate IVPB 2 g 50 mL  2 g Intravenous Once Lilee Aldea, MD   2 g at 07/27/11 1019  . morphine 2 MG/ML injection 2 mg  2 mg Intravenous Q4H PRN Steffie Waggoner, MD      . ondansetron (ZOFRAN) tablet 4 mg  4 mg Oral Q6H PRN Tanicia Wolaver, MD         Or  . ondansetron (ZOFRAN) injection 4 mg  4 mg Intravenous Q6H PRN Yanelly Cantrelle, MD      . pantoprazole (PROTONIX) injection 40 mg  40 mg Intravenous Q12H Cully Luckow, MD   40 mg at 07/27/11 1018  . potassium chloride 10 mEq in 100 mL IVPB  10 mEq Intravenous Once Diontae Route, MD   10 mEq at 07/27/11 0052  . potassium chloride 20 MEQ/15ML (10%) liquid 40 mEq  40 mEq Oral Once Loletta Harper, MD      . potassium chloride 20 MEQ/15ML (10%) liquid 40 mEq  40 mEq Oral Once Gwendoline Judy, MD   40 mEq at 07/27/11 1018  . saccharomyces boulardii (FLORASTOR) capsule 250 mg  250 mg Oral BID Florencia Reasons, MD   250 mg at 07/27/11 1018  . traMADol (ULTRAM) tablet 50 mg  50 mg Oral Q6H PRN Caley Volkert, MD      . DISCONTD: ciprofloxacin (CIPRO) IVPB 400 mg  400 mg Intravenous Q12H Rollene Fare, PHARMD   400 mg at 07/27/11 0550  . DISCONTD: fentaNYL (SUBLIMAZE) injection    PRN Katy Fitch Buccini,  MD   25 mcg at 07/26/11 1707  . DISCONTD: loperamide (IMODIUM) capsule 2 mg  2 mg Oral PRN Stephanos Fan, MD      . DISCONTD: metroNIDAZOLE (FLAGYL) IVPB 500 mg  500 mg Intravenous Q8H Railynn Ballo, MD   500 mg at 07/27/11 0848  . DISCONTD: midazolam (VERSED) 5 MG/5ML injection    PRN Florencia Reasons, MD   1 mg at 07/26/11 1714   Allergies  Allergen Reactions  . Lactose Intolerance (Gi) Nausea And Vomiting   Active Problems:  Abdominal pain, acute  Nausea and vomiting in adult  AKI (acute kidney injury)  HTN (hypertension)  Anemia  Dehydration  Anorexia   Vital signs in last 24 hours: Temp:  [98.3 F (36.8 C)-99.2 F (37.3 C)] 98.4 F (36.9 C) (07/20 0619) Pulse Rate:  [86-91] 86  (07/20 0619) Resp:  [13-26] 16  (07/20 0619) BP: (110-158)/(48-83) 158/64 mmHg (07/20 0619) SpO2:  [99 %-100 %] 100 % (07/20 0619) Weight change:  Last BM Date: 07/26/11  Intake/Output from previous day: 07/19 0701 - 07/20 0700 In: 1059.2 [P.O.:480; I.V.:579.2] Out: 1475 [Urine:1475] Intake/Output  this shift:    Lab Results:  Basename 07/27/11 0404 07/26/11 0405  WBC 8.7 8.5  HGB 9.7* 9.8*  HCT 27.1* 27.8*  PLT 205 209   BMET  Basename 07/27/11 0404 07/26/11 0405  NA 132* 134*  K 3.2* 2.7*  CL 102 101  CO2 19 20  GLUCOSE 105* 89  BUN 5* 7  CREATININE 0.97 1.05  CALCIUM 6.4* 6.4*    Studies/Results: No results found.  Medications: I have reviewed the patient's current medications.   Physical exam GENERAL- alert HEAD- normal atraumatic, no neck masses, normal thyroid, no jvd RESPIRATORY- appears well, vitals normal, no respiratory distress, acyanotic, normal RR, ear and throat exam is normal, neck free of mass or lymphadenopathy, chest clear, no wheezing, crepitations, rhonchi, normal symmetric air entry CVS- regular rate and rhythm, S1, S2 normal, no murmur, click, rub or gallop ABDOMEN- abdomen is soft without significant tenderness, masses, organomegaly or guarding NEURO- Grossly normal EXTREMITIES- extremities normal, atraumatic, no cyanosis or edema  Plan   .Abdominal pain, acute/Nausea and vomiting/diarrhea in adult - No more diarrhea. Colonoscopy normal. Appreciate gi. D/c rectal tube. D/c cipro/flagyl. Marland KitchenAKI (acute kidney injury)- prerenal. Resolved with rehydration.  Marland KitchenHTN (hypertension)- Better controlled on norvasc. norvasc. Marland KitchenAnemia- responded to transfusion of 2 units prbc. Marland KitchenHypokalemia/hypomagnesemia- due to gi loss. replenish as necessary.  .Multiple Myeloma- appreciate Dr Cyndie Chime input.  .dvt prophylaxis- scds, ambulate- PT eval. Hopefully d/c in am.        Prachi Oftedahl 07/27/2011 2:34 PM Pager: [phone].

## 2011-07-27 NOTE — Progress Notes (Signed)
Subjective: Tolerating diet. No bowel movements this morning.  Objective: Vital signs in last 24 hours: Temp:  [98.3 F (36.8 C)-99.2 F (37.3 C)] 98.4 F (36.9 C) (07/20 0619) Pulse Rate:  [86-91] 86  (07/20 0619) Resp:  [13-26] 16  (07/20 0619) BP: (110-158)/(48-83) 158/64 mmHg (07/20 0619) SpO2:  [99 %-100 %] 100 % (07/20 0619) Weight change:  Last BM Date: 07/26/11  PE: GEN:  NAD, non-toxic appearing ABD:  Soft  Procedure results from 07/26/11 by Dr. Matthias Hughs (normal colonoscopy to terminal ileum; random colon biopsies obtained) reviewed with patient.  Assessment:  1.  Diarrhea.  Perhaps chemotherapy-related.  No obvious macroscopic explanation on yesterday's colonoscopy.  C. Diff PCR negative.  Biopsies to assess for microscopic colitis are pending.  Patient has had significant improvement re: diarrhea on loperamide.  Plan:  1.  Continue probiotics and loperamide. 2.  Continue diet. 3.  If patient continues to improve, I don't see any GI-related reason he needs to stay in hospital.  If he is able to be discharged, we could follow-up his biopsies results as an outpatient. 4.  Will revisit Monday, if he is still hospitalized. 5.  Thank you for the consult.   Calvin Mccormick 07/27/2011, 11:33 AM

## 2011-07-28 LAB — BASIC METABOLIC PANEL
Calcium: 7.5 mg/dL — ABNORMAL LOW (ref 8.4–10.5)
GFR calc Af Amer: >90 mL/min (ref >90)
GFR calc non Af Amer: 84 mL/min — ABNORMAL LOW (ref >90)
Glucose, Bld: 79 mg/dL (ref 70–99)
Potassium: 4.3 mEq/L (ref 3.5–5.1)
Sodium: 130 mEq/L — ABNORMAL LOW (ref 135–145)

## 2011-07-28 LAB — MAGNESIUM: Magnesium: 1.4 mg/dL — ABNORMAL LOW (ref 1.5–2.5)

## 2011-07-28 LAB — STOOL CULTURE

## 2011-07-28 NOTE — Progress Notes (Signed)
Air overlay mattress placed on bed for comfort and skin care.

## 2011-07-28 NOTE — Evaluation (Signed)
Physical Therapy Evaluation Patient Details Name: Calvin Mccormick MRN: [id number] DOB: 07-21-1945 Today's Date: 07/28/2011 Time: 4098-1191 PT Time Calculation (min): 22 min  PT Assessment / Plan / Recommendation Clinical Impression  66 yo male admitted with abdominal pain, N/V/D. On eval pt demonstrating general weakness and difficulty WBing on R LE for ambulation. Pt required Mod-Max A for standing and pivot to recliner. Discussed ST rehab at SNF-pt refuses. Pt may need significant assist at home  if mobiilty does not improve.     PT Assessment  Patient needs continued PT services    Follow Up Recommendations  Home health PT;Supervision/Assistance - 24 hour. (Pt refuses SNF)    Barriers to Discharge        Equipment Recommendations   (to be determined)    Recommendations for Other Services OT consult   Frequency Min 3X/week    Precautions / Restrictions Precautions Precautions: Fall Restrictions Weight Bearing Restrictions: No   Pertinent Vitals/Pain       Mobility  Bed Mobility Bed Mobility: Supine to Sit Supine to Sit: HOB elevated;With rails;5: Supervision Details for Bed Mobility Assistance: VCs technique, hand placement. Increased time.  Transfers Transfers: Sit to Stand;Stand to Sit;Stand Pivot Transfers Sit to Stand: 2: Max assist;From elevated surface;With upper extremity assist;From bed Stand to Sit: 3: Mod assist;With upper extremity assist;To chair/3-in-1 Stand Pivot Transfers: 3: Mod assist Details for Transfer Assistance: VCs safety, technique, hand placement, posture. Assist to rise x2, stabilize during transfer, navigate with RW, control descent. Pt with increased difficulty WBing on R LE to allow for step with L LE. Increased time and effort for pivot from bed to recliner.  Ambulation/Gait Ambulation/Gait Assistance: Not tested (comment) Ambulation/Gait Assistance Details: Unable to attempt ambulation wtih +1 assist.     Exercises     PT Diagnosis:  Difficulty walking;Abnormality of gait;Generalized weakness;Acute pain  PT Problem List: Decreased strength;Decreased activity tolerance;Decreased mobility;Pain;Decreased knowledge of use of DME PT Treatment Interventions: DME instruction;Gait training;Functional mobility training;Therapeutic activities;Therapeutic exercise;Patient/family education   PT Goals Acute Rehab PT Goals PT Goal Formulation: With patient Time For Goal Achievement: 08/11/11 Potential to Achieve Goals: Fair Pt will go Supine/Side to Sit: with modified independence PT Goal: Supine/Side to Sit - Progress: Goal set today Pt will go Sit to Supine/Side: with modified independence PT Goal: Sit to Supine/Side - Progress: Goal set today Pt will go Sit to Stand: with supervision PT Goal: Sit to Stand - Progress: Goal set today Pt will Transfer Bed to Chair/Chair to Bed: with supervision PT Transfer Goal: Bed to Chair/Chair to Bed - Progress: Goal set today Pt will Ambulate: 51 - 150 feet;with supervision;with least restrictive assistive device PT Goal: Ambulate - Progress: Goal set today  Visit Information  Last PT Received On: 07/28/11 Assistance Needed: +2 (safety)    Subjective Data  Subjective: "I usually can walk/get around. I think its from being in the bed" Patient Stated Goal: Home   Prior Functioning  Home Living Lives With: Spouse Available Help at Discharge: Family Type of Home: House Home Access: Stairs to enter Secretary/administrator of Steps: 3 Entrance Stairs-Rails: Right Home Layout: One level Home Adaptive Equipment: Walker - rolling;Straight cane Prior Function Level of Independence: Needs assistance Needs Assistance: Bathing;Dressing;Meal Prep;Light Housekeeping Able to Take Stairs?: Yes Comments: Pt states he was ambulating wtih use of cane PTA.  Communication Communication: No difficulties    Cognition  Overall Cognitive Status: Appears within functional limits for tasks  assessed/performed Arousal/Alertness: Awake/alert Orientation Level: Appears intact  for tasks assessed Behavior During Session: [facility] for tasks performed    Extremity/Trunk Assessment Right Lower Extremity Assessment RLE ROM/Strength/Tone: Deficits RLE ROM/Strength/Tone Deficits: R ankle tender to palpation, especially around medial portion. Strength at least 3+/5 throughout Left Lower Extremity Assessment LLE ROM/Strength/Tone: Deficits LLE ROM/Strength/Tone Deficits: Strength at least 3+/5 throughout   Balance    End of Session PT - End of Session Equipment Utilized During Treatment: Gait belt Activity Tolerance: Patient limited by pain Patient left: in chair;with call bell/phone within reach Nurse Communication: Mobility status  GP     Rebeca Alert [facility] 07/28/2011, 1:35 PM [phone]

## 2011-07-29 ENCOUNTER — Encounter (HOSPITAL_COMMUNITY): Payer: Self-pay | Admitting: Oncology

## 2011-07-29 ENCOUNTER — Ambulatory Visit: Payer: Medicare PPO

## 2011-07-29 ENCOUNTER — Other Ambulatory Visit: Payer: Medicare PPO | Admitting: Lab

## 2011-07-29 DIAGNOSIS — E876 Hypokalemia: Secondary | ICD-10-CM

## 2011-07-29 HISTORY — DX: Hypomagnesemia: E83.42

## 2011-07-29 HISTORY — DX: Hypokalemia: E87.6

## 2011-07-29 LAB — OVA AND PARASITE EXAMINATION

## 2011-07-29 MED ORDER — SACCHAROMYCES BOULARDII 250 MG PO CAPS: 250.0000 mg | ORAL_CAPSULE | Freq: Two times a day (BID) | ORAL | Status: DC

## 2011-07-29 MED ORDER — MAGNESIUM OXIDE 400 MG PO TABS: 400.0000 mg | ORAL_TABLET | Freq: Two times a day (BID) | ORAL | Status: DC

## 2011-07-29 MED ORDER — LOPERAMIDE HCL 2 MG PO CAPS: 2.0000 mg | ORAL_CAPSULE | ORAL | Status: DC | PRN

## 2011-07-29 MED ORDER — AMLODIPINE BESYLATE 5 MG PO TABS: 5.0000 mg | ORAL_TABLET | Freq: Every day | ORAL | Status: DC

## 2011-07-29 MED ORDER — MAGNESIUM OXIDE 400 MG PO TABS: 400.0000 mg | ORAL_TABLET | Freq: Every day | ORAL | Status: AC

## 2011-07-29 MED ORDER — LOPERAMIDE HCL 2 MG PO CAPS: 4.0000 mg | ORAL_CAPSULE | ORAL | Status: DC | PRN

## 2011-07-29 MED FILL — Pantoprazole Sodium For IV Soln 40 MG (Base Equiv): INTRAVENOUS | Qty: 40 | Status: AC

## 2011-07-29 MED FILL — Loperamide HCl Cap 2 MG: ORAL | Qty: 2 | Status: AC

## 2011-07-29 MED FILL — Saccharomyces boulardii Cap 250 MG: ORAL | Qty: 1 | Status: AC

## 2011-07-29 MED FILL — Diphenoxylate w/ Atropine Tab 2.5-0.025 MG: ORAL | Qty: 2 | Status: AC

## 2011-07-29 NOTE — Care Management Note (Signed)
    Page 1 of 1   07/29/2011     12:28:56 PM   CARE MANAGEMENT NOTE 07/29/2011  Patient:  LAVEL, RIEMAN   Account Number:  [id number]  Date Initiated:  07/29/2011  Documentation initiated by:  CRAFT,TERRI  Subjective/Objective Assessment:   66 yo male admitted 07/24/11 with N/V/D     Action/Plan:   D/C when medically stable   Anticipated DC Date:  07/29/2011   Anticipated DC Plan:  HOME W HOME HEALTH SERVICES      DC Planning Services  CM consult      [facility] Choice  HOME HEALTH   Choice offered to / List presented to:  C-1 Patient        HH arranged  HH-2 PT      [facility] agency  [facility]   Status of service:  Completed, signed off  Discharge Disposition:  HOME W HOME HEALTH SERVICES  Per UR Regulation:  Reviewed for med. necessity/level of care/duration of stay  Comments:  07/29/11, Kathi Der RNC-MNN, BSN, [phone], CM received referral.  CM met with pt and spouse.  CM presented choice for [facility] service.  Pt and spouse chose [facility]. Information faxed to [phone] and confirmation of services received.

## 2011-07-29 NOTE — Progress Notes (Signed)
Patient has had complete resolution of his diarrhea.   The reason for this is unclear. He was recently on Flagyl, has been off his magnesium supplement, has been on Imodium, and was started on a probiotic. Any of these factors, or even mere coincidence, might account for his improvement.  I certainly agree with plans for discharge, and have discussed his post discharge management with Dr. Venetia Constable while awaiting biopsy results.  Recommendations:  1. I will contact the patient with his biopsy results when they're available, which should be the next day or 2  2. I would advise the patient that initiation of magnesium supplementation, as requested by Dr. Cyndie Chime, might be associated with recurrence of his diarrhea, so he will need to watch for that. It is possible that he will need a slight reduction in his magnesium supplement dose. Of note, his magnesium level yesterday was just below the normal range.  3. I think the patient should use his Imodium, starting at this time, on a when necessary basis rather than a scheduled basis  4. I tend to doubt that the initiation of probiotic therapy a couple of days ago is responsible for his improvement, given the fact that his diarrhea resolved almost immediately after it was started. Typically, probiotics take longer to exert a beneficial effect. Therefore, I do not think I would send the patient home on probiotic therapy, but I would certainly consider restarting it if his diarrhea comes back and it does not seem to be a result of his magnesium supplementation.  Please feel free to contact me if you have any questions concerning this patient's case.  Florencia Reasons, M.D. [phone]

## 2011-07-29 NOTE — Progress Notes (Signed)
Diarrhea has stopped. Normal colonoscopy findings noted. I am not convinced that his diarrhea had anything to do with his chemotherapy. He was on treatment for a number of months before he developed GI symptoms and signs. I stopped his Velcade at time of his recent office visit on July 8. I continued Revlimid and intermittent dexamethasone. Revlimid has been associated with constipation but not diarrhea. In any event, for the time being, I will give him a drug holiday and stop all active myeloma therapy. Magnesium remains significantly decreased. I would start him on magnesium oxide 400 mg 3 times daily. I can check a magnesium level in my office at time of his followup visit which is scheduled for next Monday, July 29. I appreciate the assistance of [facility] hospitalists and Electronics engineer. Impression: #1. Idiopathic gastroenteritis with profuse secretory diarrhea. Resolved with Flagyl and parenteral fluids. #2. Hypomagnesemia. #3. Hyperkalemia. #4. Chronic renal insufficiency #5. IgG kappa multiple myeloma. Recommendation: Hold dexamethasone and Revlimid at time of discharge. Begin magnesium oral replacement.

## 2011-07-29 NOTE — Progress Notes (Signed)
07/28/11 Progress Note  Calvin Mccormick  [name] feels great. He has no complaints. No more diarrhea. His vitals are stable. He appears stronger. Will continue current mx, and if he continues to do well, plan for d/c in am. EMR down, hence plan for d/c in am.  Diamone Whistler,MD pager#[phone].

## 2011-07-29 NOTE — Discharge Summary (Addendum)
DISCHARGE SUMMARY  Calvin Mccormick  Calvin#: [id number]  DOB:1945/10/28  Date of Admission: 07/24/2011 Date of Discharge: 07/29/2011  Attending Physician:Graceson Nichelson  Patient's NWG:NFAOZHY, Leonette Most, MD  Consults:Treatment Team:  Levert Feinstein, MD Florencia Reasons, MD  Discharge Diagnoses: Present on Admission:  .Abdominal pain, acute .Nausea and vomiting in adult .AKI (acute kidney injury) .HTN (hypertension) .Anemia .Dehydration .Anorexia .Hypomagnesemia .Hypokalemia with normal acid-base balance Severe Malnutrition  Hospital Course: Calvin Mccormick was admitted with nausea, vomiting, and diarrhea. He was also anemic, with hemoglobin of 7. He received 2 units prbc, with an appropriate response. He had electrolyte imbalances, which including hypomagnesemia(he will d/c on magnesium oxide, if diarrhea returns, this may be the contributing), this may be con, due to gi loss, which were corrected. Dr Cyndie Chime and Dr Matthias Hughs graciously followed Calvin Mccormick in consult. He had colonoscopy, which was unrevealing. Diarrhea stopped with several interventions including imodium and probiotics, not clear what did the trick. Consider reintroducing them if diarrhea returns.. Not sure of the cause. Calvin Mccormick has been quite debilitated, losing more than 30lbs this year. Dr Cyndie Chime has held further chemo, and will follow patient in office on the 29th. Dr Matthias Hughs will call Calvin Mccormick with biopsy results. I have requested home PT as Calvin Mccormick remains somewhat deconditioned. He professes lower extremity weakness , with no back pain or focal deficits.   Medication List  As of 07/29/2011  9:44 AM   STOP taking these medications         allopurinol 100 MG tablet      dexamethasone 4 MG tablet      lenalidomide 10 MG capsule      PRESCRIPTION MEDICATION         TAKE these medications         amLODipine 5 MG tablet   Commonly known as: NORVASC   Take 1 tablet (5 mg total) by mouth daily.     atorvastatin 20 MG tablet   Commonly known as: LIPITOR   Take 20 mg by mouth at bedtime.      esomeprazole 40 MG capsule   Commonly known as: NEXIUM   Take 40 mg by mouth daily before breakfast.      folic acid 1 MG tablet   Commonly known as: FOLVITE   Take 1 mg by mouth daily.      gabapentin 100 MG capsule   Commonly known as: NEURONTIN   Take 100 mg by mouth 2 (two) times daily.      hydrochlorothiazide 25 MG tablet   Commonly known as: HYDRODIURIL   Take 25 mg by mouth daily.      loperamide 2 MG capsule   Commonly known as: IMODIUM   Take 1 capsule (2 mg total) by mouth as needed for diarrhea or loose stools.      magnesium oxide 400 MG tablet   Commonly known as: MAG-OX   Take 1 tablet (400 mg total) by mouth 2 (two) times daily.      megestrol 40 MG tablet   Commonly known as: MEGACE   Take 40 mg by mouth 2 (two) times daily.      ondansetron 8 MG disintegrating tablet   Commonly known as: ZOFRAN-ODT   Take 1 tablet (8 mg total) by mouth every 8 (eight) hours as needed. For nausea.      saccharomyces boulardii 250 MG capsule   Commonly known as: FLORASTOR   Take 1 capsule (250 mg total) by mouth 2 (two)  times daily.      sildenafil 50 MG tablet   Commonly known as: VIAGRA   Take 50 mg by mouth daily as needed. For erectile dysfunction.      traMADol 50 MG tablet   Commonly known as: ULTRAM   Take 50 mg by mouth every 6 (six) hours as needed. For pain. Maximum dose= 8 tablets per day.      valACYclovir 500 MG tablet   Commonly known as: VALTREX   Take 500 mg by mouth daily.      vitamin E 400 UNIT capsule   Generic drug: vitamin E   Take 400 Units by mouth daily.             Day of Discharge BP 152/63  Pulse 76  Temp 98.7 F (37.1 C) (Oral)  Resp 17  Ht 5\' 11"  (1.803 m)  Wt 66.225 kg (146 lb)  BMI 20.36 kg/m2  SpO2 100%  Physical Exam: At baseline.  No results found for this or any previous visit (from the past 24  hour(s)).  Disposition: home today   Follow-up Appts: Discharge Orders    Future Appointments: Provider: Department: Dept Phone: Center:   08/05/2011 10:00 AM Krista Blue [facility] [phone] None   08/05/2011 10:30 AM Levert Feinstein, MD [facility] [phone] None     Future Orders Please Complete By Expires   Diet - low sodium heart healthy      Increase activity slowly           Tests Needing Follow-up: BMP/CBC/Biopsy from colonoscopy. Time spent in discharge (includes decision making & examination of pt): 20 minutes  Signed: Lexus Shampine 07/29/2011, 9:44 AM

## 2011-08-05 ENCOUNTER — Ambulatory Visit: Payer: Medicare PPO

## 2011-08-05 ENCOUNTER — Other Ambulatory Visit: Payer: Medicare PPO | Admitting: Lab

## 2011-08-05 ENCOUNTER — Encounter: Payer: Self-pay | Admitting: Oncology

## 2011-08-05 ENCOUNTER — Other Ambulatory Visit (HOSPITAL_BASED_OUTPATIENT_CLINIC_OR_DEPARTMENT_OTHER): Payer: Medicare PPO | Admitting: Lab

## 2011-08-05 ENCOUNTER — Telehealth: Payer: Self-pay | Admitting: Oncology

## 2011-08-05 ENCOUNTER — Other Ambulatory Visit: Payer: Self-pay | Admitting: *Deleted

## 2011-08-05 ENCOUNTER — Ambulatory Visit (HOSPITAL_BASED_OUTPATIENT_CLINIC_OR_DEPARTMENT_OTHER): Payer: Medicare PPO | Admitting: Oncology

## 2011-08-05 VITALS — BP 136/71 | HR 88 | Temp 99.0°F | Ht 71.0 in | Wt 144.1 lb

## 2011-08-05 DIAGNOSIS — R197 Diarrhea, unspecified: Secondary | ICD-10-CM

## 2011-08-05 DIAGNOSIS — L02519 Cutaneous abscess of unspecified hand: Secondary | ICD-10-CM

## 2011-08-05 DIAGNOSIS — K521 Toxic gastroenteritis and colitis: Secondary | ICD-10-CM

## 2011-08-05 DIAGNOSIS — C9000 Multiple myeloma not having achieved remission: Secondary | ICD-10-CM

## 2011-08-05 DIAGNOSIS — G620 Drug-induced polyneuropathy: Secondary | ICD-10-CM

## 2011-08-05 DIAGNOSIS — L03119 Cellulitis of unspecified part of limb: Secondary | ICD-10-CM | POA: Insufficient documentation

## 2011-08-05 DIAGNOSIS — K529 Noninfective gastroenteritis and colitis, unspecified: Secondary | ICD-10-CM

## 2011-08-05 DIAGNOSIS — R748 Abnormal levels of other serum enzymes: Secondary | ICD-10-CM

## 2011-08-05 LAB — CBC WITH DIFFERENTIAL/PLATELET
BASO%: 0.6 % (ref 0.0–2.0)
EOS%: 0.4 % (ref 0.0–7.0)
HCT: 28.3 % — ABNORMAL LOW (ref 38.4–49.9)
LYMPH%: 11.6 % — ABNORMAL LOW (ref 14.0–49.0)
MCH: 32.1 pg (ref 27.2–33.4)
MCHC: 35.1 g/dL (ref 32.0–36.0)
MONO#: 0.4 10*3/uL (ref 0.1–0.9)
NEUT%: 83.7 % — ABNORMAL HIGH (ref 39.0–75.0)
Platelets: 386 10*3/uL (ref 140–400)

## 2011-08-05 MED ORDER — CEPHALEXIN 500 MG PO CAPS: 500.0000 mg | ORAL_CAPSULE | Freq: Three times a day (TID) | ORAL | Status: DC

## 2011-08-05 NOTE — Progress Notes (Signed)
Hematology and Oncology Follow Up Visit  TERRION GENCARELLI [id number] [birth year] 66 y.o. 08/05/2011 8:09 PM   Principle Diagnosis: Encounter Diagnoses  Name Primary?  . Multiple myeloma without mention of remission Yes  . Diarrhea, secretory   . CKD (chronic kidney disease), stage III   . Cellulitis and abscess of hand      Interim History:   Short interim follow up visit for this 66 year old man with IgG kappa multiple myeloma diagnosed in November 2012 when he presented with anemia and progressive renal dysfunction.  He had an initial excellent response to Rx with RVD and was being evaluated for high dose consolidation chemotherapy when he suddenly developed profuse diarrhea and was admitted to hospital in May. Although he was recovering at time of a 7/8 office visit, he had lost 30 pounds. I stopped the velcade. He continued Revlimid and weekly decadron. Diarrhea recurred and he was re-admitted to the hospital 7/17. Abdominal exam was unremarkable. Routine cultures, C difficile testing, and stool for ova and parasites, were negative.  Colonoscopy to the terminal ileum with random biopsis of ileum & colon were normal. Done 7/19 by Dr Matthias Hughs. Once again, he responded to conservative treatment.  I stopped all of his anti-myeloma drugs.  He was discharged on 7/23. He reports stools are now formed and he is eating well although his weight today continues to fall. He has developed swelling and pain of his right wrist.   Medications: reviewed  Allergies:  Allergies  Allergen Reactions  . Lactose Intolerance (Gi) Nausea And Vomiting    Review of Systems: Constitutional:   See above Respiratory: no cough or dyspnea Cardiovascular:  No chest pain or palpitations Gastrointestinal:see above Genito-Urinary: no urinary tract symptoms Musculoskeletal:see above Neurologic:nno headache Skin:no rash Remaining ROS negative.  Physical Exam: Blood pressure 136/71, pulse 88, temperature 99 F  (37.2 C), temperature source Oral, height 5\' 11"  (1.803 m), weight 144 lb 1.6 oz (65.363 kg). Wt Readings from Last 3 Encounters:  08/05/11 144 lb 1.6 oz (65.363 kg)  07/24/11 146 lb (66.225 kg)  07/24/11 146 lb (66.225 kg)     General appearance: now cachectic AA man HENNT: pharynx no erythema or exudate Lymph nodes: no adenopathy Breasts: Lungs:clear and resonant Heart:regular rhythm Abdomen:soft, non-tender Extremities:edema, heat, swelling, and pain right wrist Vascular:no cyanosis;  Neurologic: no focal deficit Skin:no rash  Lab Results: Lab Results  Component Value Date   WBC 10.8* 08/05/2011   HGB 9.9* 08/05/2011   HCT 28.3* 08/05/2011   MCV 91.6 08/05/2011   PLT 386 08/05/2011     Chemistry      Component Value Date/Time   NA 134* 08/05/2011 1100   K 3.6 08/05/2011 1100   CL 99 08/05/2011 1100   CO2 25 08/05/2011 1100   BUN 9 08/05/2011 1100   CREATININE 1.09 08/05/2011 1100   CREATININE 2.03* 12/14/2010 1114      Component Value Date/Time   CALCIUM 8.2* 08/05/2011 1100   ALKPHOS 182* 08/05/2011 1100   AST 31 08/05/2011 1100   ALT 45 08/05/2011 1100   BILITOT 0.5 08/05/2011 1100     Note elevated alkaline phosphatase  Radiological Studies: Ct Abdomen Pelvis W Contrast  07/24/2011  *RADIOLOGY REPORT*  Clinical Data: Diffuse abdominal pain.  Nausea and vomiting. Diarrhea.  CT ABDOMEN AND PELVIS WITH CONTRAST  Technique:  Multidetector CT imaging of the abdomen and pelvis was performed following the standard protocol during bolus administration of intravenous contrast.  Contrast: 80mL OMNIPAQUE IOHEXOL  300 MG/ML  SOLN  Comparison: None.  Findings: The liver, gallbladder, spleen, pancreas, and adrenal glands are normal appearance.  Small left renal cyst is noted, but there is no evidence of renal mass or hydronephrosis.  No soft tissue masses or lymphadenopathy identified within the abdomen or pelvis. Mildly enlarged prostate noted, however bladder is nondilated.  No evidence  of inflammatory process or abnormal fluid collections. No evidence of inflammatory process or abnormal fluid collections. Severe lumbar spine degenerative changes and levoscoliosis noted.  IMPRESSION:  1.  No acute findings. 2.  Mild enlarged prostate. 3.  Advanced lumbar spondylosis and levoscoliosis.  Original Report Authenticated By: Danae Orleans, M.D.   Dg Chest Port 1 View  07/24/2011  *RADIOLOGY REPORT*  Clinical Data: Nausea, vomiting, weakness  PORTABLE CHEST - 1 VIEW  Comparison: Chest x-ray of 06/08/2011  Findings: The lungs are clear.  The heart is within normal limits in size.  There is a mild thoracic scoliosis convex to the right.  IMPRESSION: No active lung disease.  Mild thoracic scoliosis.  Original Report Authenticated By: Juline Patch, M.D.    Impression and Plan: 1. IgG kappa multiple myeloma Due to events above, I am forced to hold all anti-myeloma therapy until his performance status improves.  I will give him 4 weeks to gain some weight back. It is possible the velcade caused his recent GI toxicity so I will not resume this drug.  2. Unexplained recurrent, secretory diarrhea with normal colonoscopy. See #1. Above  3.  Acute cellulitis right wrist I am starting Keflex 500 mg TID x 10 days.  He is advised to call if no improvement. Will then get X-rays of wrist to evaluate joint. R/O septic arthritis. R/O gouty arthritis.  4. Unexplained elevation of alk phos - unusual for myeloma bone disease. Liver chem otherwise normal.  I will watch for now.  5. CKD III multifactorial:  HTN & Myeloma. Creatinine stable at present with initial improvement on myeloma Rx  6. Essential HTN  Blood pressure now decreased with significant weight loss   CC:. Dr Jeri Cos; Bernette Redbird; Marlaine Hind [facility]   Levert Feinstein, MD 7/29/20138:09 PM

## 2011-08-05 NOTE — Telephone Encounter (Signed)
appts made and printed for pt aom °

## 2011-08-07 LAB — COMPREHENSIVE METABOLIC PANEL
ALT: 45 U/L (ref 0–53)
CO2: 25 mEq/L (ref 19–32)
Creatinine, Ser: 1.09 mg/dL (ref 0.50–1.35)
Total Bilirubin: 0.5 mg/dL (ref 0.3–1.2)

## 2011-08-07 LAB — IMMUNOFIXATION ELECTROPHORESIS
IgA: 195 mg/dL (ref 68–379)
IgM, Serum: 44 mg/dL (ref 41–251)
Total Protein, Serum Electrophoresis: 4.9 g/dL — ABNORMAL LOW (ref 6.0–8.3)

## 2011-08-11 ENCOUNTER — Other Ambulatory Visit: Payer: Self-pay | Admitting: Nephrology

## 2011-08-11 ENCOUNTER — Other Ambulatory Visit (HOSPITAL_COMMUNITY): Payer: Self-pay | Admitting: Internal Medicine

## 2011-08-12 ENCOUNTER — Other Ambulatory Visit: Payer: Medicare PPO | Admitting: Lab

## 2011-08-22 ENCOUNTER — Other Ambulatory Visit (HOSPITAL_COMMUNITY): Payer: Self-pay | Admitting: Internal Medicine

## 2011-09-02 ENCOUNTER — Other Ambulatory Visit (HOSPITAL_BASED_OUTPATIENT_CLINIC_OR_DEPARTMENT_OTHER): Payer: Medicare PPO | Admitting: Lab

## 2011-09-02 ENCOUNTER — Encounter: Payer: Medicare PPO | Admitting: Nutrition

## 2011-09-02 ENCOUNTER — Ambulatory Visit (HOSPITAL_BASED_OUTPATIENT_CLINIC_OR_DEPARTMENT_OTHER): Payer: Medicare PPO | Admitting: Nurse Practitioner

## 2011-09-02 ENCOUNTER — Encounter: Payer: Self-pay | Admitting: Nutrition

## 2011-09-02 ENCOUNTER — Telehealth: Payer: Self-pay | Admitting: Oncology

## 2011-09-02 VITALS — BP 139/75 | HR 83 | Temp 98.3°F | Resp 20 | Ht 71.0 in | Wt 149.5 lb

## 2011-09-02 DIAGNOSIS — K529 Noninfective gastroenteritis and colitis, unspecified: Secondary | ICD-10-CM

## 2011-09-02 DIAGNOSIS — R197 Diarrhea, unspecified: Secondary | ICD-10-CM

## 2011-09-02 DIAGNOSIS — L02519 Cutaneous abscess of unspecified hand: Secondary | ICD-10-CM

## 2011-09-02 DIAGNOSIS — C9000 Multiple myeloma not having achieved remission: Secondary | ICD-10-CM

## 2011-09-02 DIAGNOSIS — I129 Hypertensive chronic kidney disease with stage 1 through stage 4 chronic kidney disease, or unspecified chronic kidney disease: Secondary | ICD-10-CM

## 2011-09-02 DIAGNOSIS — L03119 Cellulitis of unspecified part of limb: Secondary | ICD-10-CM

## 2011-09-02 DIAGNOSIS — N189 Chronic kidney disease, unspecified: Secondary | ICD-10-CM

## 2011-09-02 LAB — CBC WITH DIFFERENTIAL/PLATELET
BASO%: 0.5 % (ref 0.0–2.0)
EOS%: 0.7 % (ref 0.0–7.0)
HGB: 9.5 g/dL — ABNORMAL LOW (ref 13.0–17.1)
MCH: 31.8 pg (ref 27.2–33.4)
MCHC: 33.9 g/dL (ref 32.0–36.0)
RBC: 2.98 10*6/uL — ABNORMAL LOW (ref 4.20–5.82)
RDW: 19.8 % — ABNORMAL HIGH (ref 11.0–14.6)
lymph#: 2.9 10*3/uL (ref 0.9–3.3)

## 2011-09-02 LAB — COMPREHENSIVE METABOLIC PANEL (CC13)
ALT: 38 U/L (ref 0–55)
Albumin: 2.8 g/dL — ABNORMAL LOW (ref 3.5–5.0)
BUN: 16 mg/dL (ref 7.0–26.0)
Chloride: 111 mEq/L — ABNORMAL HIGH (ref 98–107)
Glucose: 80 mg/dl (ref 70–99)
Potassium: 3.7 mEq/L (ref 3.5–5.1)
Sodium: 138 mEq/L (ref 136–145)

## 2011-09-02 LAB — CORRECTED CALCIUM (CC13): Calcium, Corrected: 9.9 mg/dL (ref 8.4–10.4)

## 2011-09-02 NOTE — Progress Notes (Signed)
OFFICE PROGRESS NOTE  Interval history:  Calvin Mccormick returns as scheduled. To review he is a 66 year old man diagnosed with IgG kappa multiple myeloma in November 2012 at which time he presented with anemia and progressive renal dysfunction. Initial treatment with RVD with a good response. Whie being evaluated for high-dose consolidation chemotherapy he developed profuse diarrhea requiring hospitalization in May of this year. He lost a significant amount of weight. Velcade was discontinued. Revlimid and weekly Decadron were continued. The diarrhea initially improved and then recurred requiring readmission to the hospital 07/24/2011. He underwent a colonoscopy on 07/26/2011 with random biopsies of the ileum and colon being normal. Routine cultures and C. difficile testing were negative as was stool for ova and parasites. The Revlimid and Decadron were discontinued. He was discharged home 07/30/2011. At the time of an office visit with Dr. Cyndie Chime on 08/05/2011 he reported stools were formed and appetite was better. However, he was continuing to lose weight. Myeloma treatment continued to be held.  Calvin Mccormick reports that overall he is feeling much better. He has a good appetite. He has gained approximately 5 pounds since his last visit. He denies nausea/vomiting. No mouth sores. He denies pain. He continues to note neuropathy symptoms in the hands and feet.   He continues to have a significant number of loose stools on a daily basis. He estimates 7-10 loose stools 2 days ago. Yesterday he took a home remedy for the diarrhea and had one or 2 loose stools.   Objective: Blood pressure 139/75, pulse 83, temperature 98.3 F (36.8 C), temperature source Oral, resp. rate 20, height 5\' 11"  (1.803 m), weight 149 lb 8 oz (67.813 kg).  Cachectic appearance. Oropharynx is without thrush or ulceration. Lungs are clear. Regular cardiac rhythm. Abdomen is soft and nontender. No organomegaly. Trace lower leg edema  bilaterally. Motor strength 5 over 5.  Lab Results: Lab Results  Component Value Date   WBC 14.3* 09/02/2011   HGB 9.5* 09/02/2011   HCT 28.0* 09/02/2011   MCV 93.9 09/02/2011   PLT 484* 09/02/2011    Chemistry:    Chemistry      Component Value Date/Time   NA 134* 08/05/2011 1100   K 3.6 08/05/2011 1100   CL 99 08/05/2011 1100   CO2 25 08/05/2011 1100   BUN 9 08/05/2011 1100   CREATININE 1.09 08/05/2011 1100   CREATININE 2.03* 12/14/2010 1114      Component Value Date/Time   CALCIUM 8.2* 08/05/2011 1100   ALKPHOS 182* 08/05/2011 1100   AST 31 08/05/2011 1100   ALT 45 08/05/2011 1100   BILITOT 0.5 08/05/2011 1100       Studies/Results: No results found.  Medications: I have reviewed the patient's current medications.  Assessment/Plan:  1. IgG kappa multiple myeloma with treatment continuing to be on hold due to persistent diarrhea. 2. Unexplained persistent diarrhea. 3. Elevation of alkaline phosphatase on labs 08/05/2011. Repeat value pending. 4. Chronic kidney disease, multifactorial related to hypertension and myeloma. 5. Hypertension.  Disposition-Calvin Mccormick continues to have significant diarrhea of unclear etiology. At this point Dr. Cyndie Chime feels that it is highly unlikely the diarrhea is related to the myeloma treatment and recommends a referral to Dr. Matthias Hughs for additional evaluation. We will continue to hold the myeloma drugs pending gastroenterology evaluation. Calvin Mccormick will return for a followup visit in 3 weeks.  Plan reviewed with Dr. Cyndie Chime.  Lonna Cobb ANP/GNP-BC   CC: Dr. Jeri Cos, Dr. Bernette Redbird, Dr. Marlaine Hind

## 2011-09-02 NOTE — Telephone Encounter (Signed)
appts made and printed for pt,pt to see dr buccini on 10/2 10:30

## 2011-09-02 NOTE — Progress Notes (Signed)
Patient did not show up for scheduled nutrition appointment. 

## 2011-09-04 LAB — IMMUNOFIXATION ELECTROPHORESIS
IgA: 141 mg/dL (ref 68–379)
IgG (Immunoglobin G), Serum: 1130 mg/dL (ref 650–1600)
Total Protein, Serum Electrophoresis: 5.8 g/dL — ABNORMAL LOW (ref 6.0–8.3)

## 2011-09-23 ENCOUNTER — Telehealth: Payer: Self-pay | Admitting: Oncology

## 2011-09-23 ENCOUNTER — Ambulatory Visit (HOSPITAL_BASED_OUTPATIENT_CLINIC_OR_DEPARTMENT_OTHER): Payer: Medicare PPO | Admitting: Oncology

## 2011-09-23 ENCOUNTER — Other Ambulatory Visit (HOSPITAL_BASED_OUTPATIENT_CLINIC_OR_DEPARTMENT_OTHER): Payer: Medicare PPO | Admitting: Lab

## 2011-09-23 VITALS — BP 139/72 | HR 79 | Temp 98.6°F | Resp 20 | Ht 71.0 in | Wt 149.0 lb

## 2011-09-23 DIAGNOSIS — E876 Hypokalemia: Secondary | ICD-10-CM

## 2011-09-23 DIAGNOSIS — R197 Diarrhea, unspecified: Secondary | ICD-10-CM

## 2011-09-23 DIAGNOSIS — C9000 Multiple myeloma not having achieved remission: Secondary | ICD-10-CM

## 2011-09-23 DIAGNOSIS — N289 Disorder of kidney and ureter, unspecified: Secondary | ICD-10-CM

## 2011-09-23 LAB — COMPREHENSIVE METABOLIC PANEL (CC13)
AST: 31 U/L (ref 5–34)
Albumin: 2.8 g/dL — ABNORMAL LOW (ref 3.5–5.0)
Alkaline Phosphatase: 120 U/L (ref 40–150)
BUN: 17 mg/dL (ref 7.0–26.0)
Calcium: 8.7 mg/dL (ref 8.4–10.4)
Chloride: 104 mEq/L (ref 98–107)
Potassium: 2.5 mEq/L — CL (ref 3.5–5.1)
Sodium: 138 mEq/L (ref 136–145)
Total Protein: 5.8 g/dL — ABNORMAL LOW (ref 6.4–8.3)

## 2011-09-23 LAB — CBC WITH DIFFERENTIAL/PLATELET
Basophils Absolute: 0.1 10*3/uL (ref 0.0–0.1)
EOS%: 0.4 % (ref 0.0–7.0)
Eosinophils Absolute: 0.1 10*3/uL (ref 0.0–0.5)
HGB: 10.3 g/dL — ABNORMAL LOW (ref 13.0–17.1)
MCH: 32.2 pg (ref 27.2–33.4)
MONO%: 5.2 % (ref 0.0–14.0)
NEUT#: 10 10*3/uL — ABNORMAL HIGH (ref 1.5–6.5)
RBC: 3.2 10*6/uL — ABNORMAL LOW (ref 4.20–5.82)
RDW: 18.6 % — ABNORMAL HIGH (ref 11.0–14.6)
lymph#: 4.8 10*3/uL — ABNORMAL HIGH (ref 0.9–3.3)

## 2011-09-23 MED ORDER — POTASSIUM CHLORIDE ER 10 MEQ PO TBCR: 20.0000 meq | EXTENDED_RELEASE_TABLET | Freq: Two times a day (BID) | ORAL | Status: DC

## 2011-09-23 MED ORDER — POTASSIUM CHLORIDE CRYS ER 20 MEQ PO TBCR
40.0000 meq | EXTENDED_RELEASE_TABLET | Freq: Once | ORAL | Status: AC
  Administered 2011-09-23: 40 meq via ORAL
  Filled 2011-09-23: qty 2

## 2011-09-23 NOTE — Telephone Encounter (Signed)
gv pt appt schedule for September/October.  °

## 2011-09-24 NOTE — Progress Notes (Signed)
Hematology and Oncology Follow Up Visit  Calvin Mccormick [id number] 13-Jul-1945 66 y.o. 09/24/2011 7:02 PM   Principle Diagnosis: Encounter Diagnoses  Name Primary?  . Hypokalemia   . Myeloma Yes  . Diarrhea   . Anemia   . Leukocytosis      Interim History:  Short interim followup visit for this 66 year old man with IgG kappa multiple myeloma diagnosed in November of 2012 when he presented with progressive renal insufficiency. He was responding nicely to treatment with Revlimid, Velcade, and dexamethasone with fall in his IgG paraprotein, rise in his hemoglobin, and improvement in his creatinine. He developed an idiopathic diarrheal illness in May requiring hospitalization. He lost a significant amount of weight. I tapered him off all of his anti-myeloma drugs. He had a sigmoidoscopy in the hospital which was unrevealing for any acute or chronic inflammation.  Despite the fact that we have stopped all of his anti-myeloma drugs for over one month, he continues to have significant diarrhea. He has had some improvement with return of his appetite and he has gained some of his weight back. He is back on his feet again. The last time he came in he was in a wheelchair. His potassium is 2.5 today!  Medications: reviewed  Allergies:  Allergies  Allergen Reactions  . Lactose Intolerance (Gi) Nausea And Vomiting    Review of Systems: Constitutional:   Improving fatigue and weakness Respiratory: No cough or dyspnea Cardiovascular: No chest pain or palpitations  Gastrointestinal: Persistent, intermittent, severe at times, diarrhea. No hematochezia Genito-Urinary: No urinary tract symptoms Musculoskeletal: No muscle or bone pain Neurologic: Persistent paresthesias primarily soles of his feet, minor paresthesias of his fingers Skin: No rash Remaining ROS negative.  Physical Exam: Blood pressure 139/72, pulse 79, temperature 98.6 F (37 C), temperature source Oral, resp. rate 20, height 5\' 11"   (1.803 m), weight 149 lb (67.586 kg). Wt Readings from Last 3 Encounters:  09/23/11 149 lb (67.586 kg)  09/02/11 149 lb 8 oz (67.813 kg)  08/05/11 144 lb 1.6 oz (65.363 kg)     General appearance: Thin African American man weight 149 pounds today, on June 49 pounds on August 26, 144 pounds on July 29, base weight back in May prior to hospitalization 185 pounds. HENNT: Pharynx no erythema or exudate. He is missing a number of teeth Lymph nodes: No lymphadenopathy Breasts: Lungs: Clear to auscultation resonant to percussion Heart: Regular rhythm no murmur Abdomen: Soft nontender, no mass, no organomegaly Extremities: No edema, no calf tenderness Vascular: No cyanosis Neurologic: Motor strength 5 over 5, reflexes absent but symmetric, sensation mildly to moderately decreased to vibration by tuning fork exam over the fingertips Skin: No rash or ecchymosis  Lab Results: Lab Results  Component Value Date   WBC 15.7* 09/23/2011   HGB 10.3* 09/23/2011   HCT 30.7* 09/23/2011   MCV 96.0 09/23/2011   PLT 561* 09/23/2011     Chemistry      Component Value Date/Time   NA 138 09/23/2011 1439   NA 134* 08/05/2011 1100   K 2.5 Repeated and Verified* 09/23/2011 1439   K 3.6 08/05/2011 1100   CL 104 09/23/2011 1439   CL 99 08/05/2011 1100   CO2 23 09/23/2011 1439   CO2 25 08/05/2011 1100   BUN 17.0 09/23/2011 1439   BUN 9 08/05/2011 1100   CREATININE 1.5* 09/23/2011 1439   CREATININE 1.09 08/05/2011 1100   CREATININE 2.03* 12/14/2010 1114      Component Value Date/Time  CALCIUM 8.7 09/23/2011 1439   CALCIUM 8.2* 08/05/2011 1100   ALKPHOS 120 09/23/2011 1439   ALKPHOS 182* 08/05/2011 1100   AST 31 09/23/2011 1439   AST 31 08/05/2011 1100   ALT 24 09/23/2011 1439   ALT 45 08/05/2011 1100   BILITOT 0.50 09/23/2011 1439   BILITOT 0.5 08/05/2011 1100        Impression and Plan: #1. Persistent, idiopathic, diarrhea with associated severe hypokalemia despite stopping all anti-myeloma therapy for over one  month.  He is due to see Dr. Matthias Hughs again in 2 weeks. I will not resume any anti-myeloma therapy until he is reevaluated.  I believe he did receive a course of empiric Flagyl in the hospital in May despite stools being negative for C. difficile. We may need to put him back on additional Flagyl.  #2. Multiple myeloma. Holding therapy in view of #1.  #3. Renal insufficiency secondary to #2  #4. Hypokalemia secondary to #1 I gave him 40 mEq of potassium by mouth in the office today and a prescription for 20 mEq twice daily. We will have him come back to the office again on Wednesday the 18th to check a potassium level and adjust oral dose if necessary.  CC:. Dr. Jeri Cos; Dr. Bernette Redbird   Levert Feinstein, MD 9/17/20137:02 PM

## 2011-09-25 ENCOUNTER — Other Ambulatory Visit (HOSPITAL_BASED_OUTPATIENT_CLINIC_OR_DEPARTMENT_OTHER): Payer: Medicare PPO | Admitting: Lab

## 2011-09-25 DIAGNOSIS — D472 Monoclonal gammopathy: Secondary | ICD-10-CM

## 2011-09-25 DIAGNOSIS — E876 Hypokalemia: Secondary | ICD-10-CM

## 2011-09-25 DIAGNOSIS — C9000 Multiple myeloma not having achieved remission: Secondary | ICD-10-CM

## 2011-09-25 LAB — BASIC METABOLIC PANEL (CC13)
BUN: 11 mg/dL (ref 7.0–26.0)
Chloride: 103 mEq/L (ref 98–107)
Potassium: 3.4 mEq/L — ABNORMAL LOW (ref 3.5–5.1)
Sodium: 137 mEq/L (ref 136–145)

## 2011-09-25 LAB — CBC WITH DIFFERENTIAL/PLATELET
BASO%: 1.1 % (ref 0.0–2.0)
EOS%: 0.5 % (ref 0.0–7.0)
HGB: 10.1 g/dL — ABNORMAL LOW (ref 13.0–17.1)
MCH: 31.7 pg (ref 27.2–33.4)
MCHC: 33.1 g/dL (ref 32.0–36.0)
MCV: 95.6 fL (ref 79.3–98.0)
MONO%: 6.1 % (ref 0.0–14.0)
RBC: 3.2 10*6/uL — ABNORMAL LOW (ref 4.20–5.82)
RDW: 18.2 % — ABNORMAL HIGH (ref 11.0–14.6)
lymph#: 2.6 10*3/uL (ref 0.9–3.3)

## 2011-09-26 ENCOUNTER — Telehealth: Payer: Self-pay | Admitting: *Deleted

## 2011-09-26 NOTE — Telephone Encounter (Signed)
Message copied by Sabino Snipes on Thu Sep 26, 2011 11:33 AM ------      Message from: Levert Feinstein      Created: Wed Sep 25, 2011  3:40 PM       Call pt  Potassium back in safe range he can decrease to 20 meq daily = 2 x 10 meq tabs

## 2011-09-26 NOTE — Telephone Encounter (Signed)
Pt notified that K+ back to safe range & to decrease to two 10 meq tabs daily.  He has only been taking 2 tabs daily & misunderstood directions for two tabs bid.  Dr. Cyndie Chime informed.

## 2011-10-01 ENCOUNTER — Other Ambulatory Visit: Payer: Self-pay | Admitting: Nurse Practitioner

## 2011-10-01 DIAGNOSIS — E876 Hypokalemia: Secondary | ICD-10-CM

## 2011-10-01 MED ORDER — LOPERAMIDE HCL 2 MG PO TABS: ORAL_TABLET | ORAL | Status: DC

## 2011-10-01 MED ORDER — POTASSIUM CHLORIDE ER 10 MEQ PO TBCR: 20.0000 meq | EXTENDED_RELEASE_TABLET | Freq: Two times a day (BID) | ORAL | Status: DC

## 2011-10-07 ENCOUNTER — Other Ambulatory Visit (HOSPITAL_BASED_OUTPATIENT_CLINIC_OR_DEPARTMENT_OTHER): Payer: Medicare PPO | Admitting: Lab

## 2011-10-07 DIAGNOSIS — C9000 Multiple myeloma not having achieved remission: Secondary | ICD-10-CM

## 2011-10-07 DIAGNOSIS — R197 Diarrhea, unspecified: Secondary | ICD-10-CM

## 2011-10-07 DIAGNOSIS — D649 Anemia, unspecified: Secondary | ICD-10-CM

## 2011-10-07 DIAGNOSIS — E876 Hypokalemia: Secondary | ICD-10-CM

## 2011-10-07 DIAGNOSIS — D72829 Elevated white blood cell count, unspecified: Secondary | ICD-10-CM

## 2011-10-07 LAB — CBC WITH DIFFERENTIAL/PLATELET
Basophils Absolute: 0.1 10*3/uL (ref 0.0–0.1)
Eosinophils Absolute: 0.1 10*3/uL (ref 0.0–0.5)
HGB: 9.9 g/dL — ABNORMAL LOW (ref 13.0–17.1)
LYMPH%: 19.3 % (ref 14.0–49.0)
MCV: 99.4 fL — ABNORMAL HIGH (ref 79.3–98.0)
MONO#: 0.9 10*3/uL (ref 0.1–0.9)
MONO%: 7.4 % (ref 0.0–14.0)
NEUT#: 8.3 10*3/uL — ABNORMAL HIGH (ref 1.5–6.5)
Platelets: 456 10*3/uL — ABNORMAL HIGH (ref 140–400)
RBC: 3.03 10*6/uL — ABNORMAL LOW (ref 4.20–5.82)
WBC: 11.7 10*3/uL — ABNORMAL HIGH (ref 4.0–10.3)

## 2011-10-07 LAB — COMPREHENSIVE METABOLIC PANEL (CC13)
Albumin: 2.6 g/dL — ABNORMAL LOW (ref 3.5–5.0)
BUN: 13 mg/dL (ref 7.0–26.0)
CO2: 22 mEq/L (ref 22–29)
Glucose: 91 mg/dl (ref 70–99)
Potassium: 4.2 mEq/L (ref 3.5–5.1)
Sodium: 136 mEq/L (ref 136–145)
Total Bilirubin: 0.4 mg/dL (ref 0.20–1.20)
Total Protein: 5.3 g/dL — ABNORMAL LOW (ref 6.4–8.3)

## 2011-10-08 ENCOUNTER — Telehealth: Payer: Self-pay | Admitting: *Deleted

## 2011-10-08 NOTE — Telephone Encounter (Signed)
Message copied by Orbie Hurst on Tue Oct 08, 2011 12:21 PM ------      Message from: Levert Feinstein      Created: Mon Oct 07, 2011  4:14 PM       Call - counts good - stay on current dose of Agrylin

## 2011-10-08 NOTE — Telephone Encounter (Signed)
Called patient and spoke with him.  Let him know his blood counts were good and that Dr. Cyndie Chime wants him to stay on his current dose of Agrylin.

## 2011-10-09 LAB — IMMUNOFIXATION ELECTROPHORESIS: IgM, Serum: 22 mg/dL — ABNORMAL LOW (ref 41–251)

## 2011-10-09 LAB — KAPPA/LAMBDA LIGHT CHAINS
Kappa:Lambda Ratio: 0.84 (ref 0.26–1.65)
Lambda Free Lght Chn: 2.29 mg/dL (ref 0.57–2.63)

## 2011-10-11 LAB — UIFE/LIGHT CHAINS/TP QN, 24-HR UR
Alpha 2, Urine: DETECTED — AB
Free Kappa Lt Chains,Ur: 1.16 mg/dL (ref 0.14–2.42)
Free Kappa/Lambda Ratio: 14.5 ratio — ABNORMAL HIGH (ref 2.04–10.37)
Free Lt Chn Excr Rate: 22.04 mg/d
Total Protein, Urine: 1.7 mg/dL

## 2011-10-14 ENCOUNTER — Ambulatory Visit (HOSPITAL_BASED_OUTPATIENT_CLINIC_OR_DEPARTMENT_OTHER): Payer: Medicare PPO | Admitting: Oncology

## 2011-10-14 ENCOUNTER — Telehealth: Payer: Self-pay | Admitting: Oncology

## 2011-10-14 VITALS — BP 136/78 | HR 82 | Temp 98.1°F | Resp 20 | Ht 71.0 in | Wt 154.4 lb

## 2011-10-14 DIAGNOSIS — R197 Diarrhea, unspecified: Secondary | ICD-10-CM

## 2011-10-14 DIAGNOSIS — C9000 Multiple myeloma not having achieved remission: Secondary | ICD-10-CM

## 2011-10-14 DIAGNOSIS — D649 Anemia, unspecified: Secondary | ICD-10-CM

## 2011-10-14 DIAGNOSIS — E876 Hypokalemia: Secondary | ICD-10-CM

## 2011-10-14 NOTE — Telephone Encounter (Signed)
Gave pt appt for November 2013 Ml only

## 2011-10-14 NOTE — Progress Notes (Signed)
Hematology and Oncology Follow Up Visit  Calvin Mccormick [id number] 1945/07/18 66 y.o. 10/14/2011 6:29 PM   Principle Diagnosis: Encounter Diagnoses  Name Primary?  . Diarrhea Yes  . Multiple myeloma without mention of remission   . CKD (chronic kidney disease), stage III   . Anemia      Interim History:   Short interim followup visit for this 66 year old man with IgG kappa multiple myeloma diagnosed in November of 2012 when he presented with progressive renal insufficiency. He was responding nicely to treatment with Revlimid, Velcade, and dexamethasone with fall in his IgG paraprotein, rise in his hemoglobin, and improvement in his creatinine. He developed an idiopathic diarrheal illness in May requiring hospitalization. He lost a significant amount of weight. I tapered him off all of his anti-myeloma drugs. He had a sigmoidoscopy in the hospital which was unrevealing for any acute or chronic inflammation. Right up until time of most recent visit on September 16 he continued to have profuse diarrhea. He had really not been taking Imodium as prescribed. I had him go back on the drug and told him to take a maximum of 8 tablets daily. He had a followup visit with Dr. Matthias Hughs who agreed with the Imodium and told him he could take up to 10 tablets daily. This has been successful and the diarrhea has now stopped and his weight is up 5 pounds to 154 pounds compared with his September 16 visit. His performance status has improved. He is eating solid food again. He admits to a persistent mild paresthesias of his fingers and feet. He is still able to fasten buttons. He has had no interim fever. No other signs of infection.  Medications: reviewed  Allergies:  Allergies  Allergen Reactions  . Lactose Intolerance (Gi) Nausea And Vomiting    Review of Systems: Constitutional:   See above Respiratory: No cough or dyspnea Cardiovascular:  No chest pain or palpitations Gastrointestinal: No abdominal  pain Genito-Urinary: No urinary tract symptoms Musculoskeletal: No bone pain Neurologic: No headache or change in vision Skin: No rash Remaining ROS negative.  Physical Exam: Blood pressure 136/78, pulse 82, temperature 98.1 F (36.7 C), temperature source Oral, resp. rate 20, height 5\' 11"  (1.803 m), weight 154 lb 6.4 oz (70.035 kg). Wt Readings from Last 3 Encounters:  10/14/11 154 lb 6.4 oz (70.035 kg)  09/23/11 149 lb (67.586 kg)  09/02/11 149 lb 8 oz (67.813 kg)     General appearance: Thin African American man. He now appears adequately nourished HENNT: He is missing a number of teeth. Pharynx no erythema or exudate Lymph nodes: No adenopathy Breasts: Lungs: Clear to auscultation resonant to percussion Heart: Regular rhythm no murmur Abdomen: Soft nontender no mass no organomegaly Extremities: No edema no calf tenderness Vascular: No cyanosis Neurologic: Motor strength 5 over 5. Sensation is slightly decreased to vibration by tuning fork exam over the fingertips. Reflexes absent but symmetric Skin: No rash or ecchymosis  Lab Results: Lab Results  Component Value Date   WBC 11.7* 10/07/2011   HGB 9.9* 10/07/2011   HCT 30.1* 10/07/2011   MCV 99.4* 10/07/2011   PLT 456* 10/07/2011     Chemistry      Component Value Date/Time   NA 136 10/07/2011 1359   NA 134* 08/05/2011 1100   K 4.2 10/07/2011 1359   K 3.6 08/05/2011 1100   CL 107 10/07/2011 1359   CL 99 08/05/2011 1100   CO2 22 10/07/2011 1359   CO2 25 08/05/2011 1100  BUN 13.0 10/07/2011 1359   BUN 9 08/05/2011 1100   CREATININE 1.4* 10/07/2011 1359   CREATININE 1.09 08/05/2011 1100   CREATININE 2.03* 12/14/2010 1114      Component Value Date/Time   CALCIUM 8.7 10/07/2011 1359   CALCIUM 8.2* 08/05/2011 1100   ALKPHOS 96 10/07/2011 1359   ALKPHOS 182* 08/05/2011 1100   AST 27 10/07/2011 1359   AST 31 08/05/2011 1100   ALT 36 10/07/2011 1359   ALT 45 08/05/2011 1100   BILITOT 0.40 10/07/2011 1359   BILITOT 0.5 08/05/2011 1100         Radiological Studies: No results found.  Impression and Plan:  Idiopathic, diarrhea with associated severe hypokalemia despite stopping all anti-myeloma therapy for over one month. Now improving with symptomatic treatment using Imodium.   #2. Multiple myeloma.  I'm going to cautiously put him back on Revlimid 10 mg 2 weeks on 2 weeks rest plus dexamethasone 20 mg by mouth weekly   #3. Renal insufficiency secondary to #2   #4. Hypokalemia secondary to #1 now resolved with oral replacement     CC:.  Dr. Jeri Cos; Dr. Bernette Redbird   Levert Feinstein, MD 10/7/20136:29 PM

## 2011-10-18 ENCOUNTER — Telehealth: Payer: Self-pay | Admitting: *Deleted

## 2011-10-18 NOTE — Telephone Encounter (Signed)
Pt called, very confused about meds "They told me to stop Ienalidomide (Revlimid) and Allopurinol and take dexamethosone and some other med for 14days then rest 14 days."  Pt stated he is suppose to start taking it Monday. Instructed pt that office will call with clarification on 10/21/11 but (per Dr. Cyndie Chime office note) that the  "other med" is the Revlimid that he suppose to take for 14 days then rest 14 days.  Pt stated "no UPS package has been dropped off today." Also when asked if pt is taking his potassium pill stated "they said to stop that too; I don't know what to take"  Note to Dr. Cyndie Chime.

## 2011-10-21 ENCOUNTER — Telehealth: Payer: Self-pay | Admitting: *Deleted

## 2011-10-21 NOTE — Telephone Encounter (Signed)
PT. STATES DR.GRANFORTUNA TOLD HIM TO START THIS MEDICATION TODAY. NOTIFIED DR.GRANFORTUNA'S NURSE, AMY HORTON,RN.

## 2011-10-22 ENCOUNTER — Telehealth: Payer: Self-pay | Admitting: *Deleted

## 2011-10-22 MED ORDER — LENALIDOMIDE 5 MG PO CAPS: ORAL_CAPSULE | ORAL | Status: DC

## 2011-10-22 NOTE — Telephone Encounter (Signed)
Called and spoke with pt; explained that pt needs to call #[phone] (pt repeated # back) and take patient survey before Relvimid can be shipped out.  Pt verbalized understanding and said he would "call as soon as I hang up"

## 2011-10-23 ENCOUNTER — Encounter: Payer: Self-pay | Admitting: Oncology

## 2011-10-23 ENCOUNTER — Other Ambulatory Visit: Payer: Self-pay | Admitting: Oncology

## 2011-10-23 NOTE — Progress Notes (Signed)
[place], [id number], approved revlimid 5mg  until 04/20/12.

## 2011-10-29 ENCOUNTER — Other Ambulatory Visit: Payer: Self-pay | Admitting: Oncology

## 2011-11-11 ENCOUNTER — Ambulatory Visit (HOSPITAL_BASED_OUTPATIENT_CLINIC_OR_DEPARTMENT_OTHER): Payer: Medicare PPO | Admitting: Lab

## 2011-11-11 ENCOUNTER — Ambulatory Visit (HOSPITAL_BASED_OUTPATIENT_CLINIC_OR_DEPARTMENT_OTHER): Payer: Medicare PPO | Admitting: Nurse Practitioner

## 2011-11-11 ENCOUNTER — Telehealth: Payer: Self-pay | Admitting: Oncology

## 2011-11-11 VITALS — BP 125/76 | HR 99 | Temp 97.4°F | Resp 20 | Ht 71.0 in | Wt 154.1 lb

## 2011-11-11 DIAGNOSIS — N289 Disorder of kidney and ureter, unspecified: Secondary | ICD-10-CM

## 2011-11-11 DIAGNOSIS — C9000 Multiple myeloma not having achieved remission: Secondary | ICD-10-CM

## 2011-11-11 DIAGNOSIS — R197 Diarrhea, unspecified: Secondary | ICD-10-CM

## 2011-11-11 LAB — CBC WITH DIFFERENTIAL/PLATELET
BASO%: 0.1 % (ref 0.0–2.0)
Basophils Absolute: 0 10*3/uL (ref 0.0–0.1)
EOS%: 0.7 % (ref 0.0–7.0)
HGB: 10.5 g/dL — ABNORMAL LOW (ref 13.0–17.1)
MCH: 33.3 pg (ref 27.2–33.4)
NEUT#: 13.9 10*3/uL — ABNORMAL HIGH (ref 1.5–6.5)
RDW: 15 % — ABNORMAL HIGH (ref 11.0–14.6)
lymph#: 0.9 10*3/uL (ref 0.9–3.3)

## 2011-11-11 LAB — COMPREHENSIVE METABOLIC PANEL (CC13)
Albumin: 3 g/dL — ABNORMAL LOW (ref 3.5–5.0)
BUN: 15 mg/dL (ref 7.0–26.0)
Calcium: 8.3 mg/dL — ABNORMAL LOW (ref 8.4–10.4)
Chloride: 100 mEq/L (ref 98–107)
Glucose: 192 mg/dl — ABNORMAL HIGH (ref 70–99)
Potassium: 3.6 mEq/L (ref 3.5–5.1)

## 2011-11-11 LAB — LACTATE DEHYDROGENASE (CC13): LDH: 186 U/L (ref 125–220)

## 2011-11-11 NOTE — Telephone Encounter (Signed)
appts made and printed for pt aom °

## 2011-11-11 NOTE — Progress Notes (Signed)
OFFICE PROGRESS NOTE  Interval history:  Calvin Mccormick is a 66 year old man with IgG kappa multiple myeloma diagnosed November 2012 at which time he presented with progressive renal insufficiency. He was responding to treatment with Revlimid, Velcade and dexamethasone with a fall in the IgG level, rise in his hemoglobin and improvement in creatinine. He subsequently developed an idiopathic diarrheal illness in May of this year requiring hospitalization. He lost a significant amount of weight. Myeloma treatment was placed on hold. Sigmoidoscopy was unrevealing for any acute or chronic inflammation. He continued to have profuse diarrhea. He was not taking Imodium as prescribed. He had a followup visit with Dr. Matthias Hughs with instructions to take up to 10 Imodium a day. The diarrhea subsequently stopped and he began gaining weight. Myeloma treatment was resumed with Revlimid 14 days on/14 days off and dexamethasone 20 mg weekly. He resumed the Revlimid on 10/28/2011. He is seen today for scheduled followup.  Calvin Mccormick reports stools are formed as long as he takes 6-8 Imodium tablets a day. If he misses any doses stools become loose. He denies nausea/vomiting. No mouth sores. No skin rash. No shortness of breath. He denies pain. Appetite continues to improve. He has persistent mild numbness/tingling in the fingertips and toes.   Objective: Blood pressure 125/76, pulse 99, temperature 97.4 F (36.3 C), temperature source Oral, resp. rate 20, height 5\' 11"  (1.803 m), weight 154 lb 1.6 oz (69.899 kg).  Oropharynx is without thrush or ulceration. No palpable cervical, supraclavicular or axillary lymph nodes. Lungs are clear. No wheezes or rales. Regular cardiac rhythm. Abdomen is soft and nontender. No organomegaly. Extremities are without edema. Calves are soft and nontender. Motor strength 5 over 5. Vibratory sense is mildly decreased over the fingertips per tuning fork exam.  Lab Results: Lab Results  Component  Value Date   WBC 11.7* 10/07/2011   HGB 9.9* 10/07/2011   HCT 30.1* 10/07/2011   MCV 99.4* 10/07/2011   PLT 456* 10/07/2011    Chemistry:    Chemistry      Component Value Date/Time   NA 136 10/07/2011 1359   NA 134* 08/05/2011 1100   K 4.2 10/07/2011 1359   K 3.6 08/05/2011 1100   CL 107 10/07/2011 1359   CL 99 08/05/2011 1100   CO2 22 10/07/2011 1359   CO2 25 08/05/2011 1100   BUN 13.0 10/07/2011 1359   BUN 9 08/05/2011 1100   CREATININE 1.4* 10/07/2011 1359   CREATININE 1.09 08/05/2011 1100   CREATININE 2.03* 12/14/2010 1114      Component Value Date/Time   CALCIUM 8.7 10/07/2011 1359   CALCIUM 8.2* 08/05/2011 1100   ALKPHOS 96 10/07/2011 1359   ALKPHOS 182* 08/05/2011 1100   AST 27 10/07/2011 1359   AST 31 08/05/2011 1100   ALT 36 10/07/2011 1359   ALT 45 08/05/2011 1100   BILITOT 0.40 10/07/2011 1359   BILITOT 0.5 08/05/2011 1100       Studies/Results: No results found.  Medications: I have reviewed the patient's current medications.  Assessment/Plan:  1. Multiple myeloma, IgG kappa, with treatment resumed on 10/28/2011 with Revlimid 2 weeks on/2 weeks off and dexamethasone 20 mg weekly. 2. Idiopathic diarrhea. Improved with Imodium. 3. Renal insufficiency secondary to multiple myeloma. 4. History of hypokalemia secondary to diarrhea. Resolved with oral replacement.  Disposition-Mr. Dudash appears stable. He began the 2 week break from Revlimid today. He will return for a followup CBC on 11/22/2011 with plans to resume Revlimid for 14 days  beginning 11/25/2011 if the blood counts are adequate. He continues weekly dexamethasone. He will return for a followup visit in one month. He will contact the office in the interim with any problems.  Lonna Cobb ANP/GNP-BC   CC Dr. Jeri Cos and Dr. Bernette Redbird

## 2011-11-11 NOTE — Telephone Encounter (Signed)
appts made and printed for pt a and pt sent to the lab    aom

## 2011-11-13 LAB — IMMUNOFIXATION ELECTROPHORESIS
IgA: 93 mg/dL (ref 68–379)
IgG (Immunoglobin G), Serum: 839 mg/dL (ref 650–1600)

## 2011-11-13 LAB — KAPPA/LAMBDA LIGHT CHAINS: Kappa free light chain: 1.51 mg/dL (ref 0.33–1.94)

## 2011-11-18 ENCOUNTER — Other Ambulatory Visit: Payer: Self-pay | Admitting: Nurse Practitioner

## 2011-11-18 MED ORDER — LENALIDOMIDE 5 MG PO CAPS: ORAL_CAPSULE | ORAL | Status: DC

## 2011-11-19 ENCOUNTER — Telehealth: Payer: Self-pay | Admitting: *Deleted

## 2011-11-19 NOTE — Telephone Encounter (Signed)
Dr. Bascom Levels called requesting patient's medication list.  Faxed copy to [phone] due to length of medicine list.

## 2011-11-22 ENCOUNTER — Other Ambulatory Visit (HOSPITAL_BASED_OUTPATIENT_CLINIC_OR_DEPARTMENT_OTHER): Payer: Medicare PPO

## 2011-11-22 DIAGNOSIS — C9000 Multiple myeloma not having achieved remission: Secondary | ICD-10-CM

## 2011-11-22 LAB — TECHNOLOGIST REVIEW

## 2011-11-22 LAB — CBC WITH DIFFERENTIAL/PLATELET
Eosinophils Absolute: 0 10*3/uL (ref 0.0–0.5)
MONO#: 2.4 10*3/uL — ABNORMAL HIGH (ref 0.1–0.9)
NEUT#: 8.6 10*3/uL — ABNORMAL HIGH (ref 1.5–6.5)
RBC: 3.15 10*6/uL — ABNORMAL LOW (ref 4.20–5.82)
RDW: 14.6 % (ref 11.0–14.6)
WBC: 13 10*3/uL — ABNORMAL HIGH (ref 4.0–10.3)

## 2011-12-09 ENCOUNTER — Ambulatory Visit (HOSPITAL_BASED_OUTPATIENT_CLINIC_OR_DEPARTMENT_OTHER): Payer: Medicare PPO | Admitting: Oncology

## 2011-12-09 ENCOUNTER — Telehealth: Payer: Self-pay | Admitting: Oncology

## 2011-12-09 ENCOUNTER — Other Ambulatory Visit (HOSPITAL_BASED_OUTPATIENT_CLINIC_OR_DEPARTMENT_OTHER): Payer: Medicare PPO | Admitting: Lab

## 2011-12-09 VITALS — BP 153/76 | HR 86 | Temp 97.9°F | Resp 18 | Ht 71.0 in | Wt 152.5 lb

## 2011-12-09 DIAGNOSIS — K589 Irritable bowel syndrome without diarrhea: Secondary | ICD-10-CM

## 2011-12-09 DIAGNOSIS — C9000 Multiple myeloma not having achieved remission: Secondary | ICD-10-CM

## 2011-12-09 DIAGNOSIS — R197 Diarrhea, unspecified: Secondary | ICD-10-CM

## 2011-12-09 LAB — CBC WITH DIFFERENTIAL/PLATELET
BASO%: 0.2 % (ref 0.0–2.0)
Basophils Absolute: 0 10*3/uL (ref 0.0–0.1)
HCT: 33.1 % — ABNORMAL LOW (ref 38.4–49.9)
HGB: 11.1 g/dL — ABNORMAL LOW (ref 13.0–17.1)
MCHC: 33.6 g/dL (ref 32.0–36.0)
MONO#: 1.2 10*3/uL — ABNORMAL HIGH (ref 0.1–0.9)
NEUT%: 82.9 % — ABNORMAL HIGH (ref 39.0–75.0)
RDW: 15.2 % — ABNORMAL HIGH (ref 11.0–14.6)
WBC: 14.5 10*3/uL — ABNORMAL HIGH (ref 4.0–10.3)
lymph#: 1 10*3/uL (ref 0.9–3.3)

## 2011-12-09 LAB — COMPREHENSIVE METABOLIC PANEL (CC13)
ALT: 33 U/L (ref 0–55)
Albumin: 3.2 g/dL — ABNORMAL LOW (ref 3.5–5.0)
Alkaline Phosphatase: 103 U/L (ref 40–150)
CO2: 24 mEq/L (ref 22–29)
Glucose: 161 mg/dl — ABNORMAL HIGH (ref 70–99)
Potassium: 4.8 mEq/L (ref 3.5–5.1)
Sodium: 133 mEq/L — ABNORMAL LOW (ref 136–145)
Total Bilirubin: 0.77 mg/dL (ref 0.20–1.20)
Total Protein: 5.9 g/dL — ABNORMAL LOW (ref 6.4–8.3)

## 2011-12-09 NOTE — Telephone Encounter (Signed)
gv and printed appt schedule for Dec °

## 2011-12-09 NOTE — Progress Notes (Signed)
Hematology and Oncology Follow Up Visit  Calvin Mccormick [id number] 29-Nov-1945 66 y.o. 12/09/2011 11:52 AM   Principle Diagnosis: Encounter Diagnoses  Name Primary?  . Chronic renal insufficiency, stage III (moderate)   . Diarrhea   . Multiple myeloma without mention of remission Yes     Interim History: Followup visit for this 66 year old man with IgG kappa multiple myeloma diagnosed November 2012 at which time he presented with progressive renal insufficiency. He was responding to treatment with Revlimid, Velcade and dexamethasone with a fall in the IgG level, rise in his hemoglobin and improvement in creatinine. He subsequently developed an idiopathic diarrheal illness in May of this year requiring hospitalization. He lost a significant amount of weight. Myeloma treatment was placed on hold. Sigmoidoscopy was unrevealing for any acute or chronic inflammation. He continued to have profuse diarrhea. He was not taking Imodium as prescribed. He had a followup visit with Dr. Matthias Hughs with instructions to take up to 8 Imodium a day. The diarrhea subsequently stopped and he began gaining weight. Myeloma treatment was resumed with Revlimid 14 days on/14 days off and dexamethasone 20 mg weekly. He resumed the Revlimid on 10/28/2011.  He reports today that he continues to have diarrhea and is using up to 6 Imodium daily. He has noted no change in the frequency or severity of the diarrhea back on the Revlimid. He has noticed no blood in his stools. He has a scheduled followup with Dr. Matthias Hughs next week. Overall he is feeling very well. Appetite is now good. Weight has been stable. He has had no interim fever or infection.  He had a right cataract extraction 2 weeks ago.    Medications: reviewed  Allergies:  Allergies  Allergen Reactions  . Lactose Intolerance (Gi) Nausea And Vomiting    Review of Systems: Constitutional:   No constitutional symptoms Respiratory: No cough or  dyspnea Cardiovascular:  No chest pain or palpitations Gastrointestinal: See above Genito-Urinary: No urinary tract symptoms Musculoskeletal: No muscle or bone pain Neurologic: No headache or change in vision; persistent distal paresthesias fingers and feet but improved off Velcade Skin: No rash Remaining ROS negative.  Physical Exam: Blood pressure 153/76, pulse 86, temperature 97.9 F (36.6 C), temperature source Oral, resp. rate 18, height 5\' 11"  (1.803 m), weight 152 lb 8 oz (69.174 kg). Wt Readings from Last 3 Encounters:  12/09/11 152 lb 8 oz (69.174 kg)  11/11/11 154 lb 1.6 oz (69.899 kg)  10/14/11 154 lb 6.4 oz (70.035 kg)     General appearance: Well-nourished African American man. Now starting to get cushingoid HENNT: Pharynx no erythema, exudate, or ulcer Lymph nodes: No adenopathy Breasts: Lungs: Clear to auscultation resonant to percussion Heart: Regular rhythm, question 1-2/6 aortic systolic murmur Abdomen: Soft, nontender, no mass, no organomegaly Extremities: Trace-1+ ankle edema Vascular: No cyanosis Neurologic: Motor strength 5 over 5, reflexes absent but symmetric, moderate decrease to vibration sensation over the fingertips by tuning fork exam Skin: No rash or ecchymoses  Lab Results: Lab Results  Component Value Date   WBC 14.5* 12/09/2011   HGB 11.1* 12/09/2011   HCT 33.1* 12/09/2011   MCV 99.3* 12/09/2011   PLT 372 12/09/2011     Chemistry      Component Value Date/Time   NA 135* 11/11/2011 1130   NA 134* 08/05/2011 1100   K 3.6 11/11/2011 1130   K 3.6 08/05/2011 1100   CL 100 11/11/2011 1130   CL 99 08/05/2011 1100   CO2 24 11/11/2011 1130  CO2 25 08/05/2011 1100   BUN 15.0 11/11/2011 1130   BUN 9 08/05/2011 1100   CREATININE 1.6* 11/11/2011 1130   CREATININE 1.09 08/05/2011 1100   CREATININE 2.03* 12/14/2010 1114      Component Value Date/Time   CALCIUM 8.3* 11/11/2011 1130   CALCIUM 8.2* 08/05/2011 1100   ALKPHOS 91 11/11/2011 1130   ALKPHOS 182*  08/05/2011 1100   AST 21 11/11/2011 1130   AST 31 08/05/2011 1100   ALT 35 11/11/2011 1130   ALT 45 08/05/2011 1100   BILITOT 0.54 11/11/2011 1130   BILITOT 0.5 08/05/2011 1100    Immunoglobulin studies pending   Impression and Plan: #1. IgG kappa multiple myeloma. Overall counts are stable on minimal therapy. Plan: Since his diarrheal problem did not get any worse back on Revlimid plus Decadron, I'm going to increase his dose to 5 mg daily on the Revlimid. Continue the Decadron 20 mg once weekly. Given all of the events that have occurred over the last few months, I don't think that he is an acceptable candidate for high-dose chemotherapy with autologous stem cell support. I will continue to monitor his laboratory and clinical parameters on a monthly basis. If we get his GI issues straightened out, I would like to add oral Cytoxan to his regimen. For now, we will monitor his status with a dose increase in the Revlimid.  #2. Chronic renal insufficiency. Multifactorial but contribution from his myeloma given the improvement in renal function with initial treatment. Most recent creatinine is slightly higher than baseline at 1.6 up from 1.3 in September and we will need to continue to monitor closely.  #3. Irritable bowel syndrome with persistent diarrhea. [place] asked whether or not he drinks well water. He does have a well but states he drinks only bottled water that he buys in the store. His wife drinks the well water but has not had any GI issues.   CC:. Dr. Jeri Cos; Dr. Bernette Redbird; Dr. Marlaine Hind   Levert Feinstein, MD 12/2/201311:52 AM

## 2011-12-09 NOTE — Patient Instructions (Signed)
Increase Revlimid (lenalidomide) to 5 mg every day Continue dexsamethasone 5 pills = 20 mg once a week every Monday Visit with Misty Stanley, Nurse Practitioner, Monday December 30 at 10:45 AM

## 2011-12-10 ENCOUNTER — Encounter: Payer: Self-pay | Admitting: *Deleted

## 2011-12-10 ENCOUNTER — Other Ambulatory Visit: Payer: Self-pay | Admitting: *Deleted

## 2011-12-10 ENCOUNTER — Telehealth: Payer: Self-pay | Admitting: *Deleted

## 2011-12-10 DIAGNOSIS — C9000 Multiple myeloma not having achieved remission: Secondary | ICD-10-CM

## 2011-12-10 MED ORDER — LENALIDOMIDE 5 MG PO CAPS: 5.0000 mg | ORAL_CAPSULE | Freq: Every day | ORAL | Status: DC

## 2011-12-10 NOTE — Telephone Encounter (Signed)
Message copied by Sabino Snipes on Tue Dec 10, 2011  5:04 PM ------      Message from: Levert Feinstein      Created: Tue Dec 10, 2011 12:55 PM       Call patient - potassium now high normal - OK to stop potassium supplement

## 2011-12-10 NOTE — Telephone Encounter (Signed)
Per Dr. Cyndie Chime, pt notified to stop his potassium.  He had Klor-con at home & instructed to write on bottle to stop/hold for now.

## 2011-12-10 NOTE — Telephone Encounter (Signed)
Survey done for auth # & pt notified to do his survey.  All reminders given to pt & he expressed understanding.

## 2011-12-11 LAB — IGG, IGA, IGM
IgG (Immunoglobin G), Serum: 640 mg/dL — ABNORMAL LOW (ref 650–1600)
IgM, Serum: 48 mg/dL (ref 41–251)

## 2011-12-11 LAB — KAPPA/LAMBDA LIGHT CHAINS: Kappa:Lambda Ratio: 0.98 (ref 0.26–1.65)

## 2011-12-16 ENCOUNTER — Encounter: Payer: Self-pay | Admitting: Oncology

## 2011-12-16 ENCOUNTER — Other Ambulatory Visit: Payer: Self-pay | Admitting: Oncology

## 2011-12-16 NOTE — Progress Notes (Signed)
VELCADE STOPPED 07/08/11. NO NEED TO REAUTHORIZE.

## 2011-12-17 NOTE — Telephone Encounter (Signed)
[facility] faxed Rx Communication Report.  Revlimid prescription was processed and will be shipped for delivery on 12-08-2011.

## 2011-12-18 ENCOUNTER — Other Ambulatory Visit: Payer: Self-pay | Admitting: Oncology

## 2011-12-18 DIAGNOSIS — C9000 Multiple myeloma not having achieved remission: Secondary | ICD-10-CM

## 2011-12-25 ENCOUNTER — Encounter: Payer: Self-pay | Admitting: *Deleted

## 2011-12-25 NOTE — Progress Notes (Signed)
Faxed notification from [facility] that Revlimid 5 mg capsule being shipped for 12/17/11 delivery.

## 2012-01-06 ENCOUNTER — Telehealth: Payer: Self-pay | Admitting: Oncology

## 2012-01-06 ENCOUNTER — Ambulatory Visit (HOSPITAL_BASED_OUTPATIENT_CLINIC_OR_DEPARTMENT_OTHER): Payer: Medicare PPO | Admitting: Nurse Practitioner

## 2012-01-06 ENCOUNTER — Other Ambulatory Visit (HOSPITAL_BASED_OUTPATIENT_CLINIC_OR_DEPARTMENT_OTHER): Payer: Medicare PPO | Admitting: Lab

## 2012-01-06 VITALS — BP 140/81 | HR 89 | Temp 97.7°F | Resp 20 | Ht 71.0 in | Wt 153.2 lb

## 2012-01-06 DIAGNOSIS — C9000 Multiple myeloma not having achieved remission: Secondary | ICD-10-CM

## 2012-01-06 DIAGNOSIS — R197 Diarrhea, unspecified: Secondary | ICD-10-CM

## 2012-01-06 DIAGNOSIS — N289 Disorder of kidney and ureter, unspecified: Secondary | ICD-10-CM

## 2012-01-06 DIAGNOSIS — D472 Monoclonal gammopathy: Secondary | ICD-10-CM

## 2012-01-06 LAB — COMPREHENSIVE METABOLIC PANEL (CC13)
AST: 13 U/L (ref 5–34)
Alkaline Phosphatase: 84 U/L (ref 40–150)
BUN: 18 mg/dL (ref 7.0–26.0)
Creatinine: 1.7 mg/dL — ABNORMAL HIGH (ref 0.7–1.3)
Glucose: 126 mg/dl — ABNORMAL HIGH (ref 70–99)
Total Bilirubin: 0.84 mg/dL (ref 0.20–1.20)

## 2012-01-06 LAB — CBC WITH DIFFERENTIAL/PLATELET
BASO%: 0.1 % (ref 0.0–2.0)
Basophils Absolute: 0 10*3/uL (ref 0.0–0.1)
EOS%: 1.9 % (ref 0.0–7.0)
HCT: 31.4 % — ABNORMAL LOW (ref 38.4–49.9)
HGB: 10.8 g/dL — ABNORMAL LOW (ref 13.0–17.1)
MCH: 31.8 pg (ref 27.2–33.4)
MCHC: 34.4 g/dL (ref 32.0–36.0)
MCV: 92.4 fL (ref 79.3–98.0)
MONO%: 8.4 % (ref 0.0–14.0)
NEUT%: 82.2 % — ABNORMAL HIGH (ref 39.0–75.0)
lymph#: 0.9 10*3/uL (ref 0.9–3.3)

## 2012-01-06 NOTE — Telephone Encounter (Signed)
appts made and printed for pt aom °

## 2012-01-06 NOTE — Progress Notes (Signed)
OFFICE PROGRESS NOTE  Interval history:  Mr. Podolak is a 66 year old man with IgG kappa multiple myeloma. Initial diagnosis dates to November 2012 at which time he presented with progressive renal insufficiency. He was treated with Revlimid/Velcade/dexamethasone with a fall in the IgG level, rise in hemoglobin and improvement in creatinine. He subsequently developed an idiopathic diarrheal illness in May of this year requiring hospitalization. He lost a significant amount of weight. Myeloma treatment was placed on hold. Sigmoidoscopy was unrevealing for acute or chronic inflammation. He continued to have diarrhea. He was not taking Imodium as prescribed. He had followup with Dr. Matthias Hughs with instructions to take up to 8 Imodium a day. The diarrhea improved and he began gaining weight. Myeloma treatment was resumed with Revlimid 14 days on/14 days off on 10/28/2011 and dexamethasone 20 mg weekly. The Revlimid was adjusted to 5 mg daily beginning 12/09/2011 with continuation of dexamethasone 20 mg weekly. He is seen today for scheduled followup.  Mr. Filley reports the diarrhea is better. He is tapering off of the Imodium. He estimates he is taking 5 per day. He denies nausea/vomiting. No mouth sores. He reports a good appetite. He is gaining weight. He has stable numbness in the fingertips. He recently underwent right cataract surgery. No fevers, chills or sweats. No shortness of breath or cough.   Objective: Blood pressure 140/81, pulse 89, temperature 97.7 F (36.5 C), temperature source Oral, resp. rate 20, height 5\' 11"  (1.803 m), weight 153 lb 3.2 oz (69.491 kg).  Oropharynx is without thrush or ulceration. Lungs are clear. No wheezes or rales. Regular cardiac rhythm. Abdomen is soft and nontender. No organomegaly. Lower legs with 1+ pitting edema bilaterally. Calves are soft and nontender. Motor strength 5 over 5. Moderate decrease in vibratory sense over the fingertips per tuning fork exam.  Lab  Results: Lab Results  Component Value Date   WBC 12.6* 01/06/2012   HGB 10.8* 01/06/2012   HCT 31.4* 01/06/2012   MCV 92.4 01/06/2012   PLT 339 01/06/2012    Chemistry:    Chemistry      Component Value Date/Time   NA 137 01/06/2012 1035   NA 134* 08/05/2011 1100   K 3.5 01/06/2012 1035   K 3.6 08/05/2011 1100   CL 99 01/06/2012 1035   CL 99 08/05/2011 1100   CO2 26 01/06/2012 1035   CO2 25 08/05/2011 1100   BUN 18.0 01/06/2012 1035   BUN 9 08/05/2011 1100   CREATININE 1.7* 01/06/2012 1035   CREATININE 1.09 08/05/2011 1100   CREATININE 2.03* 12/14/2010 1114      Component Value Date/Time   CALCIUM 8.2* 01/06/2012 1035   CALCIUM 8.2* 08/05/2011 1100   ALKPHOS 84 01/06/2012 1035   ALKPHOS 182* 08/05/2011 1100   AST 13 01/06/2012 1035   AST 31 08/05/2011 1100   ALT 24 01/06/2012 1035   ALT 45 08/05/2011 1100   BILITOT 0.84 01/06/2012 1035   BILITOT 0.5 08/05/2011 1100       Studies/Results: No results found.  Medications: I have reviewed the patient's current medications.  Assessment/Plan:  1. Multiple myeloma, IgG kappa, with treatment resumed on 10/28/2011 with Revlimid 2 weeks on/2 weeks off and dexamethasone 20 mg weekly. Revlimid adjusted to 5 mg daily beginning 12/09/2011 with continuation of dexamethasone 20 mg weekly. 2. Idiopathic diarrhea. Improved with Imodium. He is attempting to taper off the Imodium 3. Renal insufficiency secondary to multiple myeloma. 4. History of hypokalemia secondary to diarrhea. Resolved with oral replacement.  Disposition-Mr. Cutrona appears stable. He will continue Revlimid and Decadron as outlined above. We will followup on the IgG level and serum light chain analysis from today. He will return for a followup visit in one month. He will contact the office in the interim with any problems.  Lonna Cobb ANP/GNP-BC

## 2012-01-07 LAB — KAPPA/LAMBDA LIGHT CHAINS
Kappa:Lambda Ratio: 0.88 (ref 0.26–1.65)
Lambda Free Lght Chn: 2.43 mg/dL (ref 0.57–2.63)

## 2012-01-07 LAB — IGG: IgG (Immunoglobin G), Serum: 603 mg/dL — ABNORMAL LOW (ref 650–1600)

## 2012-01-13 ENCOUNTER — Other Ambulatory Visit: Payer: Self-pay | Admitting: *Deleted

## 2012-01-13 ENCOUNTER — Encounter: Payer: Self-pay | Admitting: *Deleted

## 2012-01-13 DIAGNOSIS — C9000 Multiple myeloma not having achieved remission: Secondary | ICD-10-CM

## 2012-01-13 MED ORDER — LENALIDOMIDE 5 MG PO CAPS: 5.0000 mg | ORAL_CAPSULE | Freq: Every day | ORAL | Status: DC

## 2012-01-13 MED ORDER — ONDANSETRON 8 MG PO TBDP: 8.0000 mg | ORAL_TABLET | Freq: Three times a day (TID) | ORAL | Status: DC | PRN

## 2012-01-13 NOTE — Progress Notes (Signed)
RECEIVED A FAX FROM [facility] CONCERNING A PRIOR AUTHORIZATION FOR ONDANSETRON ODT. THIS REQUEST WAS GIVEN TO ELIZABETH SUTTON IN MANAGED CARE.

## 2012-01-13 NOTE — Telephone Encounter (Addendum)
THIS REFILL REQUEST FOR REVLIMID WAS GIVEN TO DR.GRANFORTUNA'S NURSE, MYRTLE HARDIN,RN. 

## 2012-01-13 NOTE — Telephone Encounter (Signed)
Pt called to give reminders about revlimid & to have him complete his survey.  Pt expressed understanding.  He also req. Refill on his zofran.  This was done.

## 2012-01-14 NOTE — Telephone Encounter (Signed)
RECEIVED A FAX FROM [facility] CONCERNING A PRIOR AUTHORIZATION FOR ONDANSETRON ODT. THIS REQUEST WAS GIVEN TO ELIZABETH SUTTON IN MANAGED CARE. 

## 2012-02-03 ENCOUNTER — Telehealth: Payer: Self-pay | Admitting: Oncology

## 2012-02-03 ENCOUNTER — Ambulatory Visit (HOSPITAL_BASED_OUTPATIENT_CLINIC_OR_DEPARTMENT_OTHER): Payer: Medicare Other | Admitting: Oncology

## 2012-02-03 ENCOUNTER — Other Ambulatory Visit (HOSPITAL_BASED_OUTPATIENT_CLINIC_OR_DEPARTMENT_OTHER): Payer: BC Managed Care – HMO | Admitting: Lab

## 2012-02-03 ENCOUNTER — Ambulatory Visit (HOSPITAL_COMMUNITY)
Admission: RE | Admit: 2012-02-03 | Discharge: 2012-02-03 | Disposition: A | Discharge status: Home / Self Care | Payer: Medicare Other | Source: Ambulatory Visit | Attending: Oncology | Admitting: Oncology

## 2012-02-03 VITALS — BP 124/80 | HR 108 | Temp 97.5°F | Resp 20 | Ht 71.0 in | Wt 142.2 lb

## 2012-02-03 DIAGNOSIS — D631 Anemia in chronic kidney disease: Secondary | ICD-10-CM

## 2012-02-03 DIAGNOSIS — C9000 Multiple myeloma not having achieved remission: Secondary | ICD-10-CM

## 2012-02-03 DIAGNOSIS — R6884 Jaw pain: Secondary | ICD-10-CM

## 2012-02-03 DIAGNOSIS — N189 Chronic kidney disease, unspecified: Secondary | ICD-10-CM

## 2012-02-03 DIAGNOSIS — J209 Acute bronchitis, unspecified: Secondary | ICD-10-CM

## 2012-02-03 DIAGNOSIS — Z87898 Personal history of other specified conditions: Secondary | ICD-10-CM | POA: Insufficient documentation

## 2012-02-03 LAB — CBC WITH DIFFERENTIAL/PLATELET
BASO%: 0.4 % (ref 0.0–2.0)
Eosinophils Absolute: 0.1 10*3/uL (ref 0.0–0.5)
HCT: 30.2 % — ABNORMAL LOW (ref 38.4–49.9)
LYMPH%: 6.8 % — ABNORMAL LOW (ref 14.0–49.0)
MCHC: 34.5 g/dL (ref 32.0–36.0)
MCV: 94.7 fL (ref 79.3–98.0)
MONO#: 1.5 10*3/uL — ABNORMAL HIGH (ref 0.1–0.9)
MONO%: 11.4 % (ref 0.0–14.0)
NEUT%: 80.8 % — ABNORMAL HIGH (ref 39.0–75.0)
Platelets: 337 10*3/uL (ref 140–400)
WBC: 13.4 10*3/uL — ABNORMAL HIGH (ref 4.0–10.3)

## 2012-02-03 LAB — COMPREHENSIVE METABOLIC PANEL (CC13)
ALT: 28 U/L (ref 0–55)
CO2: 25 mEq/L (ref 22–29)
Calcium: 8.2 mg/dL — ABNORMAL LOW (ref 8.4–10.4)
Chloride: 94 mEq/L — ABNORMAL LOW (ref 98–107)
Glucose: 111 mg/dl — ABNORMAL HIGH (ref 70–99)
Sodium: 132 mEq/L — ABNORMAL LOW (ref 136–145)
Total Protein: 6.7 g/dL (ref 6.4–8.3)

## 2012-02-03 NOTE — Telephone Encounter (Signed)
appts made and printed for pt aom °

## 2012-02-03 NOTE — Patient Instructions (Signed)
X-ray of jaw today Return visit next  Wednesday  @ 2:45 PM

## 2012-02-03 NOTE — Progress Notes (Signed)
Hematology and Oncology Follow Up Visit  Calvin Mccormick [id number] Feb 13, 1945 67 y.o. 02/03/2012 8:33 PM   Principle Diagnosis: Encounter Diagnoses  Name Primary?  . Multiple myeloma without mention of remission Yes  . CKD (chronic kidney disease), stage III   . Pain in mandible      Interim History:    Followup visit for this 67 year old man with IgG kappa multiple myeloma diagnosed in November of 2012 when he presented with progressive renal insufficiency. He was responding nicely to treatment with Revlimid, Velcade, and dexamethasone with fall in his IgG paraprotein, rise in his hemoglobin, and improvement in his creatinine. He developed an idiopathic diarrheal illness in May requiring hospitalization. He lost a significant amount of weight. I tapered him off all of his anti-myeloma drugs. He had a sigmoidoscopy in the hospital which was unrevealing for any acute or chronic inflammation. It took a long time but his diarrhea finally subsided. He is still having about 2 loose bowel movements a day. When I saw him back in October 2013 he was starting to gain weight and again and was eating solid food. I felt he was stable enough to go back on anti-myeloma therapy and started him on  Revlimid daily initially 10 mg 2 weeks on then 2 weeks rest and continued Decadron 20 mg weekly beginning 10/28/2011. At time of his December 2 visit I changed his Revlimid to 5 mg daily and continued Decadron at the same dose. Most recent reevaluation of his immunoglobulin studies done on 01/06/2012 showed IgG stable at 603 mg percent, Kappa free light chains 2.13 mg percent, lambda free light chains 2.43 mg percent. KAPPA/lambda ratio 0.88 (0.26-1.65).  Unfortunately he has developed a new problem. He has had  bronchitis for the last 2 weeks and over the last 48 hours he has developed significant pain and trismus over the proximal right mandible area. He denies any fevers but I doubt he actually took his temperature. He  called his family doctor yesterday and was put on a decongestant and an antibiotic. Due to recent infection, he has stopped eating again. Wife is just giving him clear liquids. When I tried  to examine him today he couldn't open his mouth more than 4 cm. He has lost 9 pounds again just since last visit on December 30 with current weight 142 pounds compared with 153. He is afebrile today.     Medications: reviewed  Allergies:  Allergies  Allergen Reactions  . Lactose Intolerance (Gi) Nausea And Vomiting    Review of Systems: Constitutional:   See above Respiratory: See above Cardiovascular:  No chest pain Gastrointestinal: See above Genito-Urinary: No urinary tract symptoms Musculoskeletal: No muscle bone or joint pain Neurologic: No headache or change in vision Skin: No rash or ecchymosis Remaining ROS negative.  Physical Exam: Blood pressure 124/80, pulse 108, temperature 97.5 F (36.4 C), temperature source Oral, resp. rate 20, height 5\' 11"  (1.803 m), weight 142 lb 3.2 oz (64.501 kg). Wt Readings from Last 3 Encounters:  02/03/12 142 lb 3.2 oz (64.501 kg)  01/06/12 153 lb 3.2 oz (69.491 kg)  12/09/11 152 lb 8 oz (69.174 kg)     General appearance:  HENNT: He was unable to open his mouth more than about 4 cm.; the right tympanic membrane is clear with a good light reflex.; There is point tenderness over the proximal mandible anterior to the right ear. Lymph nodes: No adenopathy Breasts: Lungs: Clear to auscultation resonant to percussion Heart: Regular rhythm no  murmur Abdomen: Soft nontender Extremities: No edema Vascular: No cyanosis Neurologic: Pupils are miotic. Motor strength 5 over 5. Reflexes absent but symmetric at the knees 1+ symmetric at the biceps Skin: No rash or ecchymosis Musculoskeletal: Severe scoliosis of the spine  Lab Results: Lab Results  Component Value Date   WBC 13.4* 02/03/2012   HGB 10.4* 02/03/2012   HCT 30.2* 02/03/2012   MCV 94.7  02/03/2012   PLT 337 02/03/2012     Chemistry      Component Value Date/Time   NA 132* 02/03/2012 1221   NA 134* 08/05/2011 1100   K 3.7 02/03/2012 1221   K 3.6 08/05/2011 1100   CL 94* 02/03/2012 1221   CL 99 08/05/2011 1100   CO2 25 02/03/2012 1221   CO2 25 08/05/2011 1100   BUN 14.0 02/03/2012 1221   BUN 9 08/05/2011 1100   CREATININE 1.7* 02/03/2012 1221   CREATININE 1.09 08/05/2011 1100   CREATININE 2.03* 12/14/2010 1114      Component Value Date/Time   CALCIUM 8.2* 02/03/2012 1221   CALCIUM 8.2* 08/05/2011 1100   ALKPHOS 100 02/03/2012 1221   ALKPHOS 182* 08/05/2011 1100   AST 16 02/03/2012 1221   AST 31 08/05/2011 1100   ALT 28 02/03/2012 1221   ALT 45 08/05/2011 1100   BILITOT 1.29* 02/03/2012 1221   BILITOT 0.5 08/05/2011 1100       Radiological Studies: Dg Mandible 4 Views  02/03/2012  *RADIOLOGY REPORT*  Clinical Data: History of myeloma.  Right mandible pain.  MANDIBLE - 4+ VIEW  Comparison: None  Findings: The facial bones are intact.  No mandible fracture or lytic bone lesion.  The mandibular condyle is significantly degenerated probable artifact near the right mandibular condyle.  IMPRESSION:  1.  Advanced degenerative change involving the right which appears shortened and eroded. 2.  No acute fracture or obvious bone lesion. 3.  Density near the right mandibular condyle is likely artifact.   Original Report Authenticated By: Rudie Meyer, M.D.     Impression and Plan: #1. IgG kappa multiple myeloma Lab parameters are stable and suggests he is still in a hematologic remission. I'm going to continue the current treatment plan.  #2. Tenderness over proximal mandible and associated trismus of the jaw. I think this is likely a localized infection. He has not been on Zometa. I can't exclude the possibility that he has a lytic lesion of the jaw from the myeloma. I went ahead and got x-rays today and there are no lytic lesions or fractures of the mandible. There are advanced degenerative  changes at the mandibular joint. I encouraged him to continue his antibiotics. I would like to see him back in one week.  #3. Acute bronchitis  #4. Weight loss secondary to inability to eat secondary to #2 and #3 above. I encouraged his wife to give him chicken or beef broth for good protein  and peanut butter and chocolate to boost his calories. I also gave him a prescription for Ensure dietary supplement  #5. Chronic renal insufficiency Creatinine stable at 1.7  #6. 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 Chronic diarrhea-idiopathic  #8. 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  CC:.    Levert Feinstein, MD 1/27/20148:33 PM

## 2012-02-04 LAB — KAPPA/LAMBDA LIGHT CHAINS: Kappa:Lambda Ratio: 1.04 (ref 0.26–1.65)

## 2012-02-10 ENCOUNTER — Other Ambulatory Visit: Payer: Self-pay | Admitting: *Deleted

## 2012-02-10 DIAGNOSIS — C9000 Multiple myeloma not having achieved remission: Secondary | ICD-10-CM

## 2012-02-10 MED ORDER — LENALIDOMIDE 5 MG PO CAPS: ORAL_CAPSULE | ORAL | Status: DC

## 2012-02-12 ENCOUNTER — Telehealth: Payer: Self-pay | Admitting: Nutrition

## 2012-02-12 NOTE — Telephone Encounter (Signed)
I received a message that patient's wife was interested in ensure for patient. I attempted to contact patient by telephone however needed to leave a message as no one answered. Patient has canceled previous nutrition consult with me. In reviewing physician note, I have noted patient has continued to have weight loss. I did call patient and requested he contact me so I can assist him with his nutrition needs if he wishes.

## 2012-02-13 NOTE — Telephone Encounter (Signed)
RECEIVED A FAX FROM [facility] CONCERNING A CONFIRMATION OF PRESCRIPTION SHIPMENT FOR REVLIMID ON 02/13/12.

## 2012-02-14 ENCOUNTER — Telehealth: Payer: Self-pay | Admitting: Nutrition

## 2012-02-14 NOTE — Telephone Encounter (Signed)
I received a message from patient's wife that there are requesting ensure. I have called him at home, however neither were available to speak with me. I have left a message for them to contact me if I can assist him in any way.

## 2012-02-24 ENCOUNTER — Other Ambulatory Visit: Payer: Self-pay | Admitting: Oncology

## 2012-03-10 ENCOUNTER — Telehealth: Payer: Self-pay | Admitting: *Deleted

## 2012-03-10 ENCOUNTER — Other Ambulatory Visit: Payer: Self-pay | Admitting: *Deleted

## 2012-03-10 DIAGNOSIS — C9000 Multiple myeloma not having achieved remission: Secondary | ICD-10-CM

## 2012-03-10 MED ORDER — LENALIDOMIDE 5 MG PO CAPS: ORAL_CAPSULE | ORAL | Status: DC

## 2012-03-10 NOTE — Telephone Encounter (Signed)
[facility] faxed Revlimid prescription refill request.  This request to MD for review.

## 2012-03-11 NOTE — Telephone Encounter (Signed)
[facility] faxed Rx communication report.  Patient's co-pay is $0.00.  Revlimid will be dispensed from the [facility] location pending contact with patient.

## 2012-03-30 ENCOUNTER — Other Ambulatory Visit: Payer: Self-pay | Admitting: *Deleted

## 2012-03-30 ENCOUNTER — Other Ambulatory Visit (HOSPITAL_BASED_OUTPATIENT_CLINIC_OR_DEPARTMENT_OTHER): Payer: BC Managed Care – HMO | Admitting: Lab

## 2012-03-30 DIAGNOSIS — E876 Hypokalemia: Secondary | ICD-10-CM

## 2012-03-30 DIAGNOSIS — C9000 Multiple myeloma not having achieved remission: Secondary | ICD-10-CM

## 2012-03-30 LAB — CBC WITH DIFFERENTIAL/PLATELET
BASO%: 1.2 % (ref 0.0–2.0)
Basophils Absolute: 0.1 10*3/uL (ref 0.0–0.1)
HCT: 28.1 % — ABNORMAL LOW (ref 38.4–49.9)
HGB: 10.1 g/dL — ABNORMAL LOW (ref 13.0–17.1)
LYMPH%: 11.3 % — ABNORMAL LOW (ref 14.0–49.0)
MCH: 34 pg — ABNORMAL HIGH (ref 27.2–33.4)
MCHC: 36.1 g/dL — ABNORMAL HIGH (ref 32.0–36.0)
MONO#: 0.8 10*3/uL (ref 0.1–0.9)
NEUT%: 76.7 % — ABNORMAL HIGH (ref 39.0–75.0)
Platelets: 352 10*3/uL (ref 140–400)
lymph#: 1.4 10*3/uL (ref 0.9–3.3)

## 2012-03-30 LAB — COMPREHENSIVE METABOLIC PANEL (CC13)
Albumin: 2.8 g/dL — ABNORMAL LOW (ref 3.5–5.0)
CO2: 31 mEq/L — ABNORMAL HIGH (ref 22–29)
Calcium: 7.6 mg/dL — ABNORMAL LOW (ref 8.4–10.4)
Glucose: 102 mg/dl — ABNORMAL HIGH (ref 70–99)
Sodium: 135 mEq/L — ABNORMAL LOW (ref 136–145)
Total Bilirubin: 1.01 mg/dL (ref 0.20–1.20)
Total Protein: 6.1 g/dL — ABNORMAL LOW (ref 6.4–8.3)

## 2012-03-30 MED ORDER — POTASSIUM CHLORIDE CRYS ER 20 MEQ PO TBCR: 20.0000 meq | EXTENDED_RELEASE_TABLET | Freq: Three times a day (TID) | ORAL | Status: DC

## 2012-03-30 NOTE — Telephone Encounter (Signed)
Pt in today for labs & wife states that pt has had diarrhea 3-5 x's/day & night & requested some help with disposable underwear/adult diapers.  Talked with Lenice/Financial Counselor & script given to wife to take to [facility] for 1 case  Labs came back & K+ low.  Discussed with Lonna Cobb NP & order for K+ given to take TID x 2 days, then repeat lab.  Unable to reach pt until after 5pm.  Talked with pt & wife & instructed to call Gastroenterologist tomorrow about diarrhea & instructions given on taking KCL.  Suggested clear liq x 24 hours & maybe try BRAT diet if diarrhea is better.  She reports that he doesn't have an appetite.  POF to scheduler for lab on wed.

## 2012-03-31 ENCOUNTER — Telehealth: Payer: Self-pay | Admitting: Oncology

## 2012-03-31 ENCOUNTER — Telehealth: Payer: Self-pay | Admitting: *Deleted

## 2012-03-31 LAB — IGG: IgG (Immunoglobin G), Serum: 579 mg/dL — ABNORMAL LOW (ref 650–1600)

## 2012-03-31 NOTE — Telephone Encounter (Signed)
Spoke with patient.  He has not started on K+ as rx was not ready.  Called pharmacy [facility] and called in script and instructed them to make sure patient takes a full 3 doses today.   Discussed with Lonna Cobb NP.  Will recheck labs on 3/27 after 2 full days of the TID dosing of K+.   POF done

## 2012-03-31 NOTE — Telephone Encounter (Signed)
Called pt and left message regarding lab on 3/27 and appt with MD on 04/06/12

## 2012-04-02 ENCOUNTER — Ambulatory Visit (HOSPITAL_BASED_OUTPATIENT_CLINIC_OR_DEPARTMENT_OTHER): Payer: BC Managed Care – HMO

## 2012-04-02 DIAGNOSIS — C9000 Multiple myeloma not having achieved remission: Secondary | ICD-10-CM

## 2012-04-02 DIAGNOSIS — D472 Monoclonal gammopathy: Secondary | ICD-10-CM

## 2012-04-02 DIAGNOSIS — E876 Hypokalemia: Secondary | ICD-10-CM

## 2012-04-02 LAB — CBC WITH DIFFERENTIAL/PLATELET
Eosinophils Absolute: 0.1 10*3/uL (ref 0.0–0.5)
MONO#: 2.1 10*3/uL — ABNORMAL HIGH (ref 0.1–0.9)
NEUT#: 10.4 10*3/uL — ABNORMAL HIGH (ref 1.5–6.5)
Platelets: 329 10*3/uL (ref 140–400)
RBC: 2.89 10*6/uL — ABNORMAL LOW (ref 4.20–5.82)
RDW: 18.3 % — ABNORMAL HIGH (ref 11.0–14.6)
WBC: 13.9 10*3/uL — ABNORMAL HIGH (ref 4.0–10.3)
lymph#: 1.3 10*3/uL (ref 0.9–3.3)

## 2012-04-02 LAB — TECHNOLOGIST REVIEW

## 2012-04-03 ENCOUNTER — Other Ambulatory Visit: Payer: Self-pay | Admitting: *Deleted

## 2012-04-03 LAB — BASIC METABOLIC PANEL
CO2: 29 mEq/L (ref 19–32)
Chloride: 92 mEq/L — ABNORMAL LOW (ref 96–112)
Sodium: 132 mEq/L — ABNORMAL LOW (ref 135–145)

## 2012-04-03 NOTE — Telephone Encounter (Signed)
THIS REFILL REQUEST FOR REVLIMID WAS PLACED IN DR.GRANFORTUNA'S ACTIVE WORK BOX. 

## 2012-04-06 ENCOUNTER — Ambulatory Visit (HOSPITAL_BASED_OUTPATIENT_CLINIC_OR_DEPARTMENT_OTHER): Payer: Medicare Other | Admitting: Oncology

## 2012-04-06 ENCOUNTER — Other Ambulatory Visit: Payer: Self-pay | Admitting: *Deleted

## 2012-04-06 VITALS — BP 127/67 | HR 82 | Temp 97.9°F | Resp 18 | Ht 71.0 in | Wt 126.0 lb

## 2012-04-06 DIAGNOSIS — D649 Anemia, unspecified: Secondary | ICD-10-CM

## 2012-04-06 DIAGNOSIS — N289 Disorder of kidney and ureter, unspecified: Secondary | ICD-10-CM

## 2012-04-06 DIAGNOSIS — E876 Hypokalemia: Secondary | ICD-10-CM

## 2012-04-06 DIAGNOSIS — R197 Diarrhea, unspecified: Secondary | ICD-10-CM

## 2012-04-06 DIAGNOSIS — C9000 Multiple myeloma not having achieved remission: Secondary | ICD-10-CM

## 2012-04-06 NOTE — Patient Instructions (Signed)
Try chicken and beef broth; bananas, rice, apple juice, then slowly add other solid foods to your diet Take 1 potassium pill daily if you continue to have diarrhea Return visit Mat 12 at 11:45 AM

## 2012-04-08 NOTE — Progress Notes (Signed)
Hematology and Oncology Follow Up Visit  Calvin Mccormick [id number] Oct 01, 1945 66 y.o. 04/08/2012 9:43 AM   Principle Diagnosis: Encounter Diagnoses  Name Primary?  . Diarrhea   . Hypokalemia with normal acid-base balance   . Multiple myeloma without mention of remission Yes     Interim History:   Followup visit for this 67 year old man with IgG kappa multiple myeloma.  At time of visit here in January he appear to be improving with respect to his idiopathic diarrhea which has now lasted for almost one year. He was gaining weight again. I felt he was stable enough to go back on Revlimid and I resumed at a low dose of 5 mg daily with Decadron 20 mg weekly at the end of October.Marland Kitchen Unfortunately, diarrhea has persisted and worsened again. Weight is down from 153 pounds in December to 126 pounds today.. The diarrhea is primarily postprandial. He has not noted any hematochezia or melena. Routine lab done last week showed severe hypokalemia with potassium 2.3. With respect to his myeloma, total IgG and kappa serum free light chains remain in normal range.  His primary care physician Dr. Leretha Dykes just retired.    Medications: reviewed  Allergies:  Allergies  Allergen Reactions  . Lactose Intolerance (Gi) Nausea And Vomiting    Review of Systems: Constitutional:    Respiratory: Cardiovascular:   Gastrointestinal: Genito-Urinary:  Musculoskeletal: Neurologic: Skin: Remaining ROS negative.  Physical Exam: Blood pressure 127/67, pulse 82, temperature 97.9 F (36.6 C), temperature source Oral, resp. rate 18, height 5\' 11"  (1.803 m), weight 126 lb (57.153 kg). Wt Readings from Last 3 Encounters:  04/06/12 126 lb (57.153 kg)  02/03/12 142 lb 3.2 oz (64.501 kg)  01/06/12 153 lb 3.2 oz (69.491 kg)     General appearance: He again appears cachectic HENNT: Pharynx no erythema, exudate, or ulcer Lymph nodes: No adenopathy  Breasts: Lungs: Clear to auscultation resonant to  percussion Heart: Regular rhythm no murmur Abdomen: Soft, nontender, no mass, no organomegaly Extremities: No edema, muscle wasting, Vascular: No cyanosis Neurologic: Motor strength 5 over 5, sensation intact to vibration Skin: No rash or ecchymosis  Lab Results: Lab Results  Component Value Date   WBC 13.9* 04/02/2012   HGB 9.5* 04/02/2012   HCT 28.0* 04/02/2012   MCV 96.9 04/02/2012   PLT 329 04/02/2012     Chemistry      Component Value Date/Time   NA 132* 04/02/2012 1705   NA 135* 03/30/2012 1055   K 4.0 04/02/2012 1705   K 2.3 Repeated and Verified* 03/30/2012 1055   CL 92* 04/02/2012 1705   CL 91* 03/30/2012 1055   CO2 29 04/02/2012 1705   CO2 31* 03/30/2012 1055   BUN 21 04/02/2012 1705   BUN 11.7 03/30/2012 1055   CREATININE 1.89* 04/02/2012 1705   CREATININE 1.7* 03/30/2012 1055   CREATININE 2.03* 12/14/2010 1114      Component Value Date/Time   CALCIUM 8.0* 04/02/2012 1705   CALCIUM 7.6* 03/30/2012 1055   ALKPHOS 131 03/30/2012 1055   ALKPHOS 182* 08/05/2011 1100   AST 15 03/30/2012 1055   AST 31 08/05/2011 1100   ALT 33 03/30/2012 1055   ALT 45 08/05/2011 1100   BILITOT 1.01 03/30/2012 1055   BILITOT 0.5 08/05/2011 1100       Radiological Studies: No results found.  Impression and Plan: #1. IgG kappa multiple myeloma Initial diagnosis November 2012 He has achieved an excellent response on initial treatment with RVD with subsequent modification to  just Revlimid dexamethasone. Unfortunately, due to his progressive, idiopathic, diarrhea and weight loss I will not be able to continue treatment. I do not think that his GI symptoms are directly related to the chemotherapy. When I stopped all of his medications in the recent past, it did not make any difference.  #2. Postprandial diarrhea GI evaluation to date has been unrevealing. I suggested that he try to  improve his nutrition by using beef and chicken broth and eating things like bananas and rice until diarrhea subsides  again and then slowly adding additional solid foods to his menu. I will stop all chemotherapy at this time. As noted above, I still don't believe that his GI symptoms are related to the Revlimid. This drug causes constipation as a side effect not diarrhea.  #3. Profound hypokalemia secondary to #2. We replaced his potassium orally and potassium level today is normal again. I'm going to keep him on potassium 20 mEq daily.  #4. Chronic renal insufficiency. Improvement in his renal function with response to anti-myeloma therapy.  #5. 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

## 2012-04-13 ENCOUNTER — Other Ambulatory Visit: Payer: Self-pay | Admitting: Oncology

## 2012-04-29 ENCOUNTER — Other Ambulatory Visit: Payer: Self-pay | Admitting: *Deleted

## 2012-04-29 ENCOUNTER — Telehealth: Payer: Self-pay | Admitting: *Deleted

## 2012-04-29 DIAGNOSIS — C9000 Multiple myeloma not having achieved remission: Secondary | ICD-10-CM

## 2012-04-29 MED ORDER — ENSURE PLUS PO LIQD: 1.0000 | Freq: Three times a day (TID) | ORAL | Status: DC

## 2012-04-29 NOTE — Telephone Encounter (Signed)
Late Entry:  Script given to pt's wife for 1 cas of disposable undrwear/liners/Adult Diapers on 03/30/12 with PRN refills.  This script was handled by financial counselors & sent to [facility].

## 2012-05-06 ENCOUNTER — Other Ambulatory Visit: Payer: Self-pay | Admitting: Oncology

## 2012-05-07 ENCOUNTER — Other Ambulatory Visit: Payer: Self-pay | Admitting: Oncology

## 2012-05-07 ENCOUNTER — Telehealth: Payer: Self-pay | Admitting: Oncology

## 2012-05-13 ENCOUNTER — Telehealth: Payer: Self-pay | Admitting: *Deleted

## 2012-05-13 NOTE — Telephone Encounter (Signed)
Faxed request received from [facility] asking if patient's Revlimid is still on hold.  Collaborative nurse returned refill request to Triage with note that it is on hold and patient is for f/u here on 05-18-2012.  Called [facility], spoke with Toniann Fail of the Revlimid Team and notified herof this information.Toniann Fail will wait until 05-20-2012 to get further clarification from provider.

## 2012-05-18 ENCOUNTER — Other Ambulatory Visit (HOSPITAL_BASED_OUTPATIENT_CLINIC_OR_DEPARTMENT_OTHER): Payer: BC Managed Care – HMO | Admitting: Lab

## 2012-05-18 ENCOUNTER — Ambulatory Visit (HOSPITAL_BASED_OUTPATIENT_CLINIC_OR_DEPARTMENT_OTHER): Payer: Medicare Other | Admitting: Nurse Practitioner

## 2012-05-18 ENCOUNTER — Other Ambulatory Visit: Payer: BC Managed Care – HMO | Admitting: Lab

## 2012-05-18 ENCOUNTER — Telehealth: Payer: Self-pay | Admitting: Oncology

## 2012-05-18 VITALS — BP 145/84 | HR 98 | Temp 98.3°F | Resp 18 | Ht 71.0 in | Wt 131.6 lb

## 2012-05-18 DIAGNOSIS — N289 Disorder of kidney and ureter, unspecified: Secondary | ICD-10-CM

## 2012-05-18 DIAGNOSIS — R197 Diarrhea, unspecified: Secondary | ICD-10-CM

## 2012-05-18 DIAGNOSIS — E876 Hypokalemia: Secondary | ICD-10-CM

## 2012-05-18 DIAGNOSIS — C9000 Multiple myeloma not having achieved remission: Secondary | ICD-10-CM

## 2012-05-18 LAB — CBC WITH DIFFERENTIAL/PLATELET
BASO%: 0.3 % (ref 0.0–2.0)
Basophils Absolute: 0 10*3/uL (ref 0.0–0.1)
EOS%: 0.4 % (ref 0.0–7.0)
HGB: 9.5 g/dL — ABNORMAL LOW (ref 13.0–17.1)
MCH: 34.5 pg — ABNORMAL HIGH (ref 27.2–33.4)
MCHC: 33.1 g/dL (ref 32.0–36.0)
MCV: 104.3 fL — ABNORMAL HIGH (ref 79.3–98.0)
MONO%: 5.4 % (ref 0.0–14.0)
RBC: 2.75 10*6/uL — ABNORMAL LOW (ref 4.20–5.82)
RDW: 17.2 % — ABNORMAL HIGH (ref 11.0–14.6)
lymph#: 2 10*3/uL (ref 0.9–3.3)

## 2012-05-18 LAB — COMPREHENSIVE METABOLIC PANEL (CC13)
ALT: 24 U/L (ref 0–55)
Albumin: 2.6 g/dL — ABNORMAL LOW (ref 3.5–5.0)
Alkaline Phosphatase: 90 U/L (ref 40–150)
CO2: 24 mEq/L (ref 22–29)
Glucose: 113 mg/dl — ABNORMAL HIGH (ref 70–99)
Potassium: 4.8 mEq/L (ref 3.5–5.1)
Sodium: 142 mEq/L (ref 136–145)
Total Bilirubin: 0.39 mg/dL (ref 0.20–1.20)
Total Protein: 5.7 g/dL — ABNORMAL LOW (ref 6.4–8.3)

## 2012-05-18 NOTE — Progress Notes (Signed)
OFFICE PROGRESS NOTE  Interval history:   Calvin Mccormick is a 67 year old man with IgG kappa multiple myeloma. Initial diagnosis dates to November 2012 at which time he presented with progressive renal insufficiency. He was treated with Revlimid/Velcade/dexamethasone with a fall in the IgG level, rise in hemoglobin and improvement in creatinine. He subsequently developed an idiopathic diarrheal illness in May of this year requiring hospitalization. He lost a significant amount of weight. Myeloma treatment was placed on hold. Sigmoidoscopy was unrevealing for acute or chronic inflammation. He continued to have diarrhea. He was not taking Imodium as prescribed. He had followup with Dr. Matthias Hughs with instructions to take up to 8 Imodium a day. The diarrhea improved and he began gaining weight. Myeloma treatment was resumed with Revlimid 14 days on/14 days off on 10/28/2011 and dexamethasone 20 mg weekly. The Revlimid was adjusted to 5 mg daily beginning 12/09/2011 with continuation of dexamethasone 20 mg weekly.  Labs on 03/30/2012 showed IgG 579 and serum free kappa light chains 1.43 as compared to 2.16 on 02/03/2012.  At the time of a routine followup visit with Dr. Cyndie Chime on 04/06/2012 Calvin Mccormick was noted to have lost a significant amount weight. He was began experiencing diarrhea. Treatment was placed on hold.  He is seen today for scheduled followup. He is feeling better. He has gained about 5 pounds since his last visit. The diarrhea is much better. He noted improvement beginning approximately 1 month ago. He estimates one to 2 loose stools per day. He denies nausea/vomiting. No mouth sores. No fever. No shortness of breath. He denies pain. Appetite is better.  Yesterday his wife removed a "tick" from the left upper leg. The head was not attached to the body. She is concerned the head is still embedded in his leg.   Objective: Blood pressure 145/84, pulse 98, temperature 98.3 F (36.8 C),  temperature source Oral, resp. rate 18, height 5\' 11"  (1.803 m), weight 131 lb 9.6 oz (59.693 kg), SpO2 100.00%.  No thrush or ulceration. No palpable cervical, supraclavicular or axillary lymph nodes. Lungs are clear. Regular cardiac rhythm. Abdomen is soft and nontender. No organomegaly. Extremities are without edema. Motor strength 5 over 5. At the left medial thigh there is an approximate 1 cm area of rounded erythema with induration. There is a small dark area at the center.  Lab Results: Lab Results  Component Value Date   WBC 10.9* 05/18/2012   HGB 9.5* 05/18/2012   HCT 28.6* 05/18/2012   MCV 104.3* 05/18/2012   PLT 282 05/18/2012    Chemistry:    Chemistry      Component Value Date/Time   NA 142 05/18/2012 1120   NA 132* 04/02/2012 1705   K 4.8 05/18/2012 1120   K 4.0 04/02/2012 1705   CL 110* 05/18/2012 1120   CL 92* 04/02/2012 1705   CO2 24 05/18/2012 1120   CO2 29 04/02/2012 1705   BUN 21.5 05/18/2012 1120   BUN 21 04/02/2012 1705   CREATININE 1.7* 05/18/2012 1120   CREATININE 1.89* 04/02/2012 1705   CREATININE 2.03* 12/14/2010 1114      Component Value Date/Time   CALCIUM 8.6 05/18/2012 1120   CALCIUM 8.0* 04/02/2012 1705   ALKPHOS 90 05/18/2012 1120   ALKPHOS 182* 08/05/2011 1100   AST 16 05/18/2012 1120   AST 31 08/05/2011 1100   ALT 24 05/18/2012 1120   ALT 45 08/05/2011 1100   BILITOT 0.39 05/18/2012 1120   BILITOT 0.5 08/05/2011 1100  Studies/Results: No results found.  Medications: I have reviewed the patient's current medications.  Assessment/Plan:  1. Multiple myeloma, IgG kappa, with previous treatment as outlined above. Treatment was placed on hold following an office visit 04/06/2012 due to recurrent diarrhea and weight loss.  2. Idiopathic diarrhea. Improved. 3. Renal insufficiency secondary to multiple myeloma. 4. History of hypokalemia secondary to diarrhea. Resolved with oral replacement. 5. Recent tick bite, question if head still embedded left  thigh.  Disposition-Mr. Puleo appears improved. The diarrhea is better and he is gaining weight. We will continue to monitor the myeloma off of treatment. We will followup on the serum light chains from today. He will return for a followup visit in 6 weeks.  We instructed him to seek evaluation at an urgent care facility regarding the recent tike bite.  Plan reviewed with Dr. Cyndie Chime.  Lonna Cobb ANP/GNP-BC

## 2012-05-19 ENCOUNTER — Telehealth: Payer: Self-pay | Admitting: Nutrition

## 2012-05-19 LAB — IGG: IgG (Immunoglobin G), Serum: 408 mg/dL — ABNORMAL LOW (ref 650–1600)

## 2012-05-19 LAB — KAPPA/LAMBDA LIGHT CHAINS
Kappa free light chain: 0.45 mg/dL (ref 0.33–1.94)
Lambda Free Lght Chn: 0.97 mg/dL (ref 0.57–2.63)

## 2012-05-19 NOTE — Telephone Encounter (Signed)
I received a message that patient was interested in an ensure samples. I attempted to call patient back on his home phone however there was no answer and no way for me to leave a message.

## 2012-05-21 ENCOUNTER — Telehealth: Payer: Self-pay | Admitting: *Deleted

## 2012-05-21 NOTE — Telephone Encounter (Signed)
Fax received from [facility] asking "is patient is ready for a refill on Revlimid".  Faxed form back indicating revlimid discontinued at this time.  Patient has experienced sever diarrhea and being monitored off treatment at this time per last visit on 05-18-2012.

## 2012-06-08 ENCOUNTER — Other Ambulatory Visit: Payer: Self-pay | Admitting: Oncology

## 2012-06-08 DIAGNOSIS — C9000 Multiple myeloma not having achieved remission: Secondary | ICD-10-CM

## 2012-06-10 NOTE — Telephone Encounter (Signed)
Called [facility] to cancel refill of dexamethasone sent on yesterday.  Patient is no longer on Revlimid treatment so dexamethasone should be discontinued.discontinued.  Called home number [phone] to notify patient.  Message left om answering machine with instructions to stop weekly steroid.

## 2012-06-10 NOTE — Addendum Note (Signed)
Addended by: Augusto Garbe on: 06/10/2012 09:35 AM   Modules accepted: Orders, Medications

## 2012-06-23 ENCOUNTER — Other Ambulatory Visit: Payer: Self-pay | Admitting: Oncology

## 2012-06-29 ENCOUNTER — Other Ambulatory Visit (HOSPITAL_BASED_OUTPATIENT_CLINIC_OR_DEPARTMENT_OTHER): Payer: BC Managed Care – HMO | Admitting: Lab

## 2012-06-29 ENCOUNTER — Telehealth: Payer: Self-pay | Admitting: Oncology

## 2012-06-29 ENCOUNTER — Encounter: Payer: Self-pay | Admitting: Oncology

## 2012-06-29 ENCOUNTER — Other Ambulatory Visit: Payer: Self-pay | Admitting: Oncology

## 2012-06-29 ENCOUNTER — Ambulatory Visit (HOSPITAL_BASED_OUTPATIENT_CLINIC_OR_DEPARTMENT_OTHER): Payer: BC Managed Care – HMO | Admitting: Oncology

## 2012-06-29 VITALS — BP 129/80 | HR 92 | Temp 97.6°F | Resp 18 | Ht 71.0 in | Wt 140.9 lb

## 2012-06-29 DIAGNOSIS — C9000 Multiple myeloma not having achieved remission: Secondary | ICD-10-CM

## 2012-06-29 DIAGNOSIS — E876 Hypokalemia: Secondary | ICD-10-CM

## 2012-06-29 DIAGNOSIS — R197 Diarrhea, unspecified: Secondary | ICD-10-CM

## 2012-06-29 DIAGNOSIS — C9001 Multiple myeloma in remission: Secondary | ICD-10-CM

## 2012-06-29 LAB — CBC WITH DIFFERENTIAL/PLATELET
BASO%: 1.1 % (ref 0.0–2.0)
Eosinophils Absolute: 0.1 10*3/uL (ref 0.0–0.5)
LYMPH%: 23.2 % (ref 14.0–49.0)
MCHC: 34.5 g/dL (ref 32.0–36.0)
MCV: 98.1 fL — ABNORMAL HIGH (ref 79.3–98.0)
MONO%: 7.8 % (ref 0.0–14.0)
NEUT#: 4.9 10*3/uL (ref 1.5–6.5)
Platelets: 384 10*3/uL (ref 140–400)
RBC: 2.92 10*6/uL — ABNORMAL LOW (ref 4.20–5.82)
RDW: 14.7 % — ABNORMAL HIGH (ref 11.0–14.6)
WBC: 7.3 10*3/uL (ref 4.0–10.3)

## 2012-06-29 LAB — COMPREHENSIVE METABOLIC PANEL (CC13)
AST: 13 U/L (ref 5–34)
Albumin: 3.1 g/dL — ABNORMAL LOW (ref 3.5–5.0)
Alkaline Phosphatase: 93 U/L (ref 40–150)
BUN: 16.8 mg/dL (ref 7.0–26.0)
Creatinine: 2 mg/dL — ABNORMAL HIGH (ref 0.7–1.3)
Glucose: 143 mg/dl — ABNORMAL HIGH (ref 70–99)
Total Bilirubin: 0.39 mg/dL (ref 0.20–1.20)

## 2012-06-29 NOTE — Progress Notes (Signed)
Hematology and Oncology Follow Up Visit  Calvin Mccormick [id number] 03/21/1945 67 y.o. 06/29/2012 7:10 PM   Principle Diagnosis: Encounter Diagnoses  Name Primary?  . Multiple myeloma without mention of remission   . CKD (chronic kidney disease), stage III   . Diarrhea   . Hypokalemia with normal acid-base balance   . Multiple myeloma in remission Yes     Interim History:    Followup visit for this 67 year old man with IgG kappa multiple myeloma diagnosed in November of 2012 when he presented with progressive renal insufficiency. He was responding nicely to treatment with Revlimid, Velcade, and dexamethasone with fall in his IgG paraprotein, rise in his hemoglobin, and improvement in his creatinine. He developed an idiopathic diarrheal illness in May, 2013 requiring hospitalization. He lost a significant amount of weight. I tapered him off all of his anti-myeloma drugs. He had a sigmoidoscopy in the hospital which was unrevealing for any acute or chronic inflammation. It took a long time but his diarrhea finally subsided.. When I saw him  in October 2013 he was starting to gain weight and again and was eating solid food. I felt he was stable enough to go back on anti-myeloma therapy and started him on Revlimid daily initially 10 mg 2 weeks on then 2 weeks rest and continued Decadron 20 mg weekly beginning 10/28/2011. At time of his December 2 visit I changed his Revlimid to 5 mg daily and continued Decadron at the same dose. Unfortunately when I saw him for a followup visit in March,2014  his diarrhea recurred again. He again had a rapid decline in his weight of 27 pounds over  a three-month interval. He developed recurrent, severe, hypokalemia with potassium down to 2.3. Once again I stopped all of his myeloma medications. He was started on Megace and nutritional supplements. He had followup visits with his gastroenterologist but no further recommendations were made. We saw him again in May. He was  starting to do better with less diarrhea and had gained 5 pounds. His myeloma profile remained stable off treatment and I elected not to resume it again.  He has made further progress since that time. He has now gained a total of 10 pounds back. He is still having some intermittent loose bowel movements but not on a daily basis. In fact, he tells me that he now has some days when he does not have a bowel movement at all. No other interim problems. No interim infections.   Medications: reviewed  Allergies:  Allergies  Allergen Reactions  . Lactose Intolerance (Gi) Nausea And Vomiting    Review of Systems: Constitutional:   Improving constitutional symptoms Respiratory: No cough or dyspnea Cardiovascular:  No chest pain or palpitations Gastrointestinal: See above Genito-Urinary: No urinary tract symptoms Musculoskeletal: No bone pain Neurologic: No headache Skin: No rash Remaining ROS negative.  Physical Exam: Blood pressure 129/80, pulse 92, temperature 97.6 F (36.4 C), temperature source Oral, resp. rate 18, height 5\' 11"  (1.803 m), weight 140 lb 14.4 oz (63.912 kg). Wt Readings from Last 3 Encounters:  06/29/12 140 lb 14.4 oz (63.912 kg)  05/18/12 131 lb 9.6 oz (59.693 kg)  04/06/12 126 lb (57.153 kg)     General appearance: Now adequately nourished African American man HENNT: Pharynx no erythema, exudate, or ulcer Lymph nodes: No adenopathy Breasts: Lungs: Clear to auscultation resonant to percussion Heart: Regular rhythm no murmur Abdomen: Soft, nontender, no mass, no organomegaly Extremities: No edema, no calf tenderness Musculoskeletal: No joint deformities  GU: Vascular: No cyanosis Neurologic: Motor strength 5 over 5, reflexes 1+ symmetric, minimal decrease in vibration sensation over the fingertips Skin: No rash or ecchymosis  Lab Results: Lab Results  Component Value Date   WBC 7.3 06/29/2012   HGB 9.9* 06/29/2012   HCT 28.7* 06/29/2012   MCV 98.1*  06/29/2012   PLT 384 06/29/2012     Chemistry      Component Value Date/Time   NA 138 06/29/2012 1131   NA 132* 04/02/2012 1705   K 4.3 06/29/2012 1131   K 4.0 04/02/2012 1705   CL 107 06/29/2012 1131   CL 92* 04/02/2012 1705   CO2 20* 06/29/2012 1131   CO2 29 04/02/2012 1705   BUN 16.8 06/29/2012 1131   BUN 21 04/02/2012 1705   CREATININE 2.0* 06/29/2012 1131   CREATININE 1.89* 04/02/2012 1705   CREATININE 2.03* 12/14/2010 1114      Component Value Date/Time   CALCIUM 9.7 06/29/2012 1131   CALCIUM 8.0* 04/02/2012 1705   ALKPHOS 93 06/29/2012 1131   ALKPHOS 182* 08/05/2011 1100   AST 13 06/29/2012 1131   AST 31 08/05/2011 1100   ALT 10 06/29/2012 1131   ALT 45 08/05/2011 1100   BILITOT 0.39 06/29/2012 1131   BILITOT 0.5 08/05/2011 1100     IgG: 408 mg percent on 05/18/2012. Today's value pending Kappa free light chains normal at 0.45 mg percent with kappa lambda ratio normal at 0.46   Impression: #1. IgG kappa multiple myeloma He remains in a clinical remission off treatment now for 3 months. Given the difficulties we have had outlined above, I will continue close observation alone.  #2. Idiopathic diarrheal illness. This lasted for over one year. Finally improving. I told him he can stop his Megace.  #3. Severe hypokalemia related to #2. Potassium now normal at 4.3. He had already cut his potassium tablets down to one daily. I told him to continue this dose.  #4. Chronic renal insufficiency. Slight rise in his creatinine today from baseline of 1.7 up to 2.0  #5. Essential hypertension Blood pressure under good control on current medication    Levert Feinstein, MD 6/23/20147:10 PM

## 2012-06-29 NOTE — Telephone Encounter (Signed)
Gave pt appt dor lab and ML on September 2014

## 2012-06-30 LAB — KAPPA/LAMBDA LIGHT CHAINS
Kappa free light chain: 1.32 mg/dL (ref 0.33–1.94)
Kappa:Lambda Ratio: 0.96 (ref 0.26–1.65)
Lambda Free Lght Chn: 1.37 mg/dL (ref 0.57–2.63)

## 2012-06-30 LAB — IGG: IgG (Immunoglobin G), Serum: 452 mg/dL — ABNORMAL LOW (ref 650–1600)

## 2012-07-01 ENCOUNTER — Encounter: Payer: Self-pay | Admitting: Nutrition

## 2012-07-01 NOTE — Progress Notes (Signed)
I provided patient with one complementary case of Ensure Plus. 

## 2012-07-21 ENCOUNTER — Other Ambulatory Visit: Payer: Self-pay | Admitting: Oncology

## 2012-09-02 ENCOUNTER — Other Ambulatory Visit: Payer: Self-pay | Admitting: Oncology

## 2012-09-02 DIAGNOSIS — C9000 Multiple myeloma not having achieved remission: Secondary | ICD-10-CM

## 2012-09-23 ENCOUNTER — Other Ambulatory Visit: Payer: Self-pay | Admitting: *Deleted

## 2012-09-23 DIAGNOSIS — E876 Hypokalemia: Secondary | ICD-10-CM

## 2012-09-23 MED ORDER — POTASSIUM CHLORIDE CRYS ER 20 MEQ PO TBCR: 20.0000 meq | EXTENDED_RELEASE_TABLET | Freq: Every day | ORAL | Status: DC

## 2012-09-28 ENCOUNTER — Telehealth: Payer: Self-pay | Admitting: Oncology

## 2012-09-28 ENCOUNTER — Other Ambulatory Visit (HOSPITAL_BASED_OUTPATIENT_CLINIC_OR_DEPARTMENT_OTHER): Payer: Medicare Other | Admitting: Lab

## 2012-09-28 ENCOUNTER — Ambulatory Visit (HOSPITAL_BASED_OUTPATIENT_CLINIC_OR_DEPARTMENT_OTHER): Payer: Medicare Other | Admitting: Nurse Practitioner

## 2012-09-28 VITALS — BP 129/70 | HR 78 | Temp 97.7°F | Resp 20 | Ht 71.0 in | Wt 150.5 lb

## 2012-09-28 DIAGNOSIS — R197 Diarrhea, unspecified: Secondary | ICD-10-CM

## 2012-09-28 DIAGNOSIS — I10 Essential (primary) hypertension: Secondary | ICD-10-CM

## 2012-09-28 DIAGNOSIS — C9000 Multiple myeloma not having achieved remission: Secondary | ICD-10-CM

## 2012-09-28 DIAGNOSIS — C9001 Multiple myeloma in remission: Secondary | ICD-10-CM

## 2012-09-28 DIAGNOSIS — N289 Disorder of kidney and ureter, unspecified: Secondary | ICD-10-CM

## 2012-09-28 DIAGNOSIS — D649 Anemia, unspecified: Secondary | ICD-10-CM

## 2012-09-28 LAB — CBC WITH DIFFERENTIAL/PLATELET
BASO%: 1.5 % (ref 0.0–2.0)
EOS%: 1.8 % (ref 0.0–7.0)
LYMPH%: 28.5 % (ref 14.0–49.0)
MCH: 29.7 pg (ref 27.2–33.4)
MCHC: 33 g/dL (ref 32.0–36.0)
MONO#: 0.7 10*3/uL (ref 0.1–0.9)
RBC: 3.3 10*6/uL — ABNORMAL LOW (ref 4.20–5.82)
WBC: 7.9 10*3/uL (ref 4.0–10.3)
lymph#: 2.2 10*3/uL (ref 0.9–3.3)

## 2012-09-28 LAB — COMPREHENSIVE METABOLIC PANEL (CC13)
ALT: 12 U/L (ref 0–55)
AST: 19 U/L (ref 5–34)
Albumin: 3.3 g/dL — ABNORMAL LOW (ref 3.5–5.0)
CO2: 23 mEq/L (ref 22–29)
Calcium: 10 mg/dL (ref 8.4–10.4)
Chloride: 108 mEq/L (ref 98–109)
Creatinine: 1.8 mg/dL — ABNORMAL HIGH (ref 0.7–1.3)
Potassium: 4.7 mEq/L (ref 3.5–5.1)

## 2012-09-28 NOTE — Progress Notes (Signed)
OFFICE PROGRESS NOTE  Interval history:   Calvin Mccormick is a 67 year old man with IgG kappa multiple myeloma. Initial diagnosis dates to November 2012 at which time he presented with progressive renal insufficiency. He was treated with Revlimid/Velcade/dexamethasone with a fall in the IgG level, rise in hemoglobin and improvement in creatinine. He subsequently developed an idiopathic diarrheal illness in May of this year requiring hospitalization. He lost a significant amount of weight. Myeloma treatment was placed on hold. Sigmoidoscopy was unrevealing for acute or chronic inflammation. He continued to have diarrhea. He was not taking Imodium as prescribed. He had followup with Dr. Matthias Hughs with instructions to take up to 8 Imodium a day. The diarrhea improved and he began gaining weight. Myeloma treatment was resumed with Revlimid 14 days on/14 days off on 10/28/2011 and dexamethasone 20 mg weekly. The Revlimid was adjusted to 5 mg daily beginning 12/09/2011 with continuation of dexamethasone 20 mg weekly. Labs on 03/30/2012 showed IgG 579 and serum free kappa light chains 1.43 as compared to 2.16 on 02/03/2012.   At the time of a routine followup visit with Dr. Cyndie Chime on 04/06/2012 Calvin Mccormick was noted to have lost a significant amount weight. He was again experiencing diarrhea. Treatment was placed on hold.  At an office visit on 05/18/2012 he was feeling better and gaining weight. The diarrhea had improved. Decision made to continue to monitor myeloma off of treatment.  Myeloma labs have remained stable off of treatment with most recent values from 06/29/2012.  He presents today for scheduled followup. He reports undergoing cataract surgery on the right eye recently. No interim illnesses or infections. He overall is feeling well. He continues to gain weight. His appetite is good. Bowel habits are alternating constipation and diarrhea. He takes Imodium for the diarrhea and then becomes constipated. No  hematuria or dysuria. He denies any bleeding. No pain. No rash. He denies shortness of breath and cough. No chest pain. He intermittently notes some swelling of the left knee and lower leg. He notes pain in the hands and feet intermittently, mainly occurring at nighttime.  Objective: Blood pressure 129/70, pulse 78, temperature 97.7 F (36.5 C), temperature source Oral, resp. rate 20, height 5\' 11"  (1.803 m), weight 150 lb 8 oz (68.266 kg).  Oropharynx is without thrush or ulceration. No palpable cervical, supraclavicular or axillary lymph nodes. Lungs are clear. No wheezes or rales. Regular cardiac rhythm. No murmur. Abdomen is soft and nontender. No organomegaly. Extremities are without edema. Calves are nontender. Motor strength 5 over 5. Mild decrease in vibratory sense over the fingertips per tuning fork exam. No skin rash.  Lab Results: Lab Results  Component Value Date   WBC 7.9 09/28/2012   HGB 9.8* 09/28/2012   HCT 29.7* 09/28/2012   MCV 90.1 09/28/2012   PLT 397 09/28/2012    Chemistry:    Chemistry      Component Value Date/Time   NA 140 09/28/2012 1109   NA 132* 04/02/2012 1705   K 4.7 09/28/2012 1109   K 4.0 04/02/2012 1705   CL 107 06/29/2012 1131   CL 92* 04/02/2012 1705   CO2 23 09/28/2012 1109   CO2 29 04/02/2012 1705   BUN 15.8 09/28/2012 1109   BUN 21 04/02/2012 1705   CREATININE 1.8* 09/28/2012 1109   CREATININE 1.89* 04/02/2012 1705   CREATININE 2.03* 12/14/2010 1114      Component Value Date/Time   CALCIUM 10.0 09/28/2012 1109   CALCIUM 8.0* 04/02/2012 1705   ALKPHOS  98 09/28/2012 1109   ALKPHOS 182* 08/05/2011 1100   AST 19 09/28/2012 1109   AST 31 08/05/2011 1100   ALT 12 09/28/2012 1109   ALT 45 08/05/2011 1100   BILITOT 0.40 09/28/2012 1109   BILITOT 0.5 08/05/2011 1100       Studies/Results: No results found.  Medications: I have reviewed the patient's current medications.  Assessment/Plan:  1. Multiple myeloma, IgG kappa, with previous treatment as outlined  above. Treatment was placed on hold following an office visit 04/06/2012 due to recurrent diarrhea and weight loss.  2. Idiopathic diarrhea. Improved. 3. Renal insufficiency secondary to multiple myeloma. 4. History of hypokalemia secondary to diarrhea. Resolved with oral replacement. 5. Hypertension.  Disposition-Mr. Fitton appears stable. He continues to gain weight. Myeloma treatment remains on hold. We will followup on the light chain analysis and serum immunoglobulins from today. Plan to continue to follow on an observation approach if lab parameters remain stable.  He will return for a followup visit in 3 months.  Lonna Cobb ANP/GNP-BC

## 2012-09-28 NOTE — Telephone Encounter (Signed)
gve the pt his 3 mnth f/u appt for jan 2015.

## 2012-09-30 LAB — IMMUNOFIXATION ELECTROPHORESIS
IgA: 116 mg/dL (ref 68–379)
IgM, Serum: 60 mg/dL (ref 41–251)
Total Protein, Serum Electrophoresis: 6.1 g/dL (ref 6.0–8.3)

## 2012-09-30 LAB — KAPPA/LAMBDA LIGHT CHAINS
Kappa:Lambda Ratio: 1.05 (ref 0.26–1.65)
Lambda Free Lght Chn: 1.48 mg/dL (ref 0.57–2.63)

## 2012-10-09 ENCOUNTER — Other Ambulatory Visit: Payer: Self-pay | Admitting: Oncology

## 2012-10-11 ENCOUNTER — Other Ambulatory Visit: Payer: Self-pay | Admitting: Oncology

## 2012-10-12 ENCOUNTER — Telehealth: Payer: Self-pay | Admitting: *Deleted

## 2012-10-12 NOTE — Telephone Encounter (Signed)
Spoke with patient and let him know he can stop taking his magnesium per Dr.Granfortuna.  He found the correct pill bottle and will stop these.

## 2012-11-09 ENCOUNTER — Other Ambulatory Visit: Payer: Self-pay | Admitting: *Deleted

## 2012-11-11 ENCOUNTER — Other Ambulatory Visit: Payer: Self-pay | Admitting: Oncology

## 2012-11-11 NOTE — Telephone Encounter (Signed)
Unable to reach patient to discuss refill of amlodipine-was not on his med list and last script expired in July 2014.  [facility] last [facility] 09/18/12-originally written by a Lourdes Sledge, PA in hospital-will not refill-needs to follow up with his PCP for future refills.

## 2013-01-08 ENCOUNTER — Other Ambulatory Visit (HOSPITAL_BASED_OUTPATIENT_CLINIC_OR_DEPARTMENT_OTHER): Payer: Medicare Other

## 2013-01-08 ENCOUNTER — Ambulatory Visit (HOSPITAL_BASED_OUTPATIENT_CLINIC_OR_DEPARTMENT_OTHER): Payer: Medicare Other | Admitting: Oncology

## 2013-01-08 ENCOUNTER — Telehealth: Payer: Self-pay | Admitting: Oncology

## 2013-01-08 VITALS — BP 149/70 | HR 64 | Temp 97.3°F | Resp 18 | Ht 71.0 in | Wt 153.2 lb

## 2013-01-08 DIAGNOSIS — C9001 Multiple myeloma in remission: Secondary | ICD-10-CM

## 2013-01-08 DIAGNOSIS — C9000 Multiple myeloma not having achieved remission: Secondary | ICD-10-CM

## 2013-01-08 DIAGNOSIS — N183 Chronic kidney disease, stage 3 unspecified: Secondary | ICD-10-CM

## 2013-01-08 DIAGNOSIS — D649 Anemia, unspecified: Secondary | ICD-10-CM

## 2013-01-08 DIAGNOSIS — I10 Essential (primary) hypertension: Secondary | ICD-10-CM

## 2013-01-08 DIAGNOSIS — R197 Diarrhea, unspecified: Secondary | ICD-10-CM

## 2013-01-08 DIAGNOSIS — N189 Chronic kidney disease, unspecified: Secondary | ICD-10-CM

## 2013-01-08 DIAGNOSIS — E876 Hypokalemia: Secondary | ICD-10-CM

## 2013-01-08 DIAGNOSIS — H269 Unspecified cataract: Secondary | ICD-10-CM

## 2013-01-08 LAB — COMPREHENSIVE METABOLIC PANEL (CC13)
ALBUMIN: 4 g/dL (ref 3.5–5.0)
ALK PHOS: 116 U/L (ref 40–150)
ALT: 17 U/L (ref 0–55)
AST: 27 U/L (ref 5–34)
Anion Gap: 13 mEq/L — ABNORMAL HIGH (ref 3–11)
BUN: 22.7 mg/dL (ref 7.0–26.0)
CALCIUM: 10.1 mg/dL (ref 8.4–10.4)
CHLORIDE: 105 meq/L (ref 98–109)
CO2: 22 meq/L (ref 22–29)
Creatinine: 1.7 mg/dL — ABNORMAL HIGH (ref 0.7–1.3)
GLUCOSE: 96 mg/dL (ref 70–140)
Potassium: 4.3 mEq/L (ref 3.5–5.1)
Sodium: 139 mEq/L (ref 136–145)
Total Bilirubin: 0.39 mg/dL (ref 0.20–1.20)
Total Protein: 7.6 g/dL (ref 6.4–8.3)

## 2013-01-08 LAB — CBC WITH DIFFERENTIAL/PLATELET
BASO%: 1 % (ref 0.0–2.0)
BASOS ABS: 0.1 10*3/uL (ref 0.0–0.1)
EOS ABS: 0.3 10*3/uL (ref 0.0–0.5)
EOS%: 2.7 % (ref 0.0–7.0)
HCT: 33.7 % — ABNORMAL LOW (ref 38.4–49.9)
HEMOGLOBIN: 11 g/dL — AB (ref 13.0–17.1)
LYMPH#: 2.3 10*3/uL (ref 0.9–3.3)
LYMPH%: 22.1 % (ref 14.0–49.0)
MCH: 29.3 pg (ref 27.2–33.4)
MCHC: 32.7 g/dL (ref 32.0–36.0)
MCV: 89.4 fL (ref 79.3–98.0)
MONO#: 0.6 10*3/uL (ref 0.1–0.9)
MONO%: 6.1 % (ref 0.0–14.0)
NEUT%: 68.1 % (ref 39.0–75.0)
NEUTROS ABS: 7.2 10*3/uL — AB (ref 1.5–6.5)
Platelets: 449 10*3/uL — ABNORMAL HIGH (ref 140–400)
RBC: 3.77 10*6/uL — AB (ref 4.20–5.82)
RDW: 15.3 % — ABNORMAL HIGH (ref 11.0–14.6)
WBC: 10.6 10*3/uL — AB (ref 4.0–10.3)

## 2013-01-08 LAB — LACTATE DEHYDROGENASE (CC13): LDH: 199 U/L (ref 125–245)

## 2013-01-08 NOTE — Progress Notes (Signed)
Hematology and Oncology Follow Up Visit  Calvin Mccormick [id number] 10-Jun-1945 68 y.o. 01/08/2013 4:22 PM   Principle Diagnosis: Encounter Diagnoses  Name Primary?  . Multiple myeloma without mention of remission   . Chronic renal insufficiency, stage III (moderate)   . HTN (hypertension)   . Cataract Yes     Interim History:   Followup visit for this 68 year old man with IgG kappa multiple myeloma diagnosed in November of 2012 when he presented with progressive renal insufficiency. He was responding nicely to treatment with Revlimid, Velcade, and dexamethasone with fall in his IgG paraprotein, rise in his hemoglobin, and improvement in his creatinine. He developed an idiopathic diarrheal illness in May, 2013 requiring hospitalization. He lost a significant amount of weight. I tapered him off all of his anti-myeloma drugs. He had a sigmoidoscopy in the hospital which was unrevealing for any acute or chronic inflammation. It took a long time but his diarrhea finally subsided.. When I saw him in October 2013 he was starting to gain weight and again and was eating solid food. I felt he was stable enough to go back on anti-myeloma therapy and started him on Revlimid daily initially 10 mg 2 weeks on then 2 weeks rest and continued Decadron 20 mg weekly beginning 10/28/2011. At time of his December 09, 2011 visit I changed his Revlimid to 5 mg daily and continued Decadron at the same dose.  Unfortunately when I saw him for a followup visit in March,2014 his diarrhea recurred again. He again had a rapid decline in his weight of 27 pounds over a three-month interval. He developed recurrent, severe, hypokalemia with potassium down to 2.3.  Once again I stopped all of his myeloma medications. He was started on Megace and nutritional supplements. He had followup visits with his gastroenterologist but no further recommendations were made. We saw him again in May. He was starting to do better with less diarrhea and  had gained 5 pounds. His myeloma profile remained stable off treatment and I elected not to resume it again.  He continues to do well at this time. He is unable to maintain his weight. He is still having some loose bowel movements and using up to 2 Imodium tablets daily. He has had no interim infections. He has declined to take the flu vaccine.   Medications: reviewed  Allergies:  Allergies  Allergen Reactions  . Lactose Intolerance (Gi) Nausea And Vomiting    Review of Systems: Hematology:  No bleeding or bruising ENT ROS: No sore throat. Breast ROS:  Respiratory ROS: No cough or dyspnea Cardiovascular ROS: No chest pain or palpitations   Gastrointestinal ROS: See above  Genito-Urinary ROS: No urinary tract symptoms Musculoskeletal ROS: No bone pain Neurological ROS: No headache. He is developing any increasing dense cataract in the left eye.. Dermatological ROS: No rash Remaining ROS negative.  Physical Exam: Blood pressure 149/70, pulse 64, temperature 97.3 F (36.3 C), temperature source Oral, resp. rate 18, height '5\' 11"'  (1.803 m), weight 153 lb 3.2 oz (69.491 kg), SpO2 100.00%. Wt Readings from Last 3 Encounters:  01/08/13 153 lb 3.2 oz (69.491 kg)  09/28/12 150 lb 8 oz (68.266 kg)  06/29/12 140 lb 14.4 oz (63.912 kg)     General appearance: Adequately nourished African American man HENNT: Pharynx no erythema, exudate, mass, or ulcer. No thyromegaly or thyroid nodules Lymph nodes: No cervical, supraclavicular, or axillary lymphadenopathy Breasts:  Lungs: Clear to auscultation, resonant to percussion throughout Heart: Regular rhythm, no murmur, no  gallop, no rub, no click, no edema Abdomen: Soft, nontender, normal bowel sounds, no mass, no organomegaly Extremities: No edema, no calf tenderness Musculoskeletal: no joint deformities GU: Vascular: Carotid pulses 2+, no bruits, Neurologic: Alert, oriented,   cranial nerves grossly normal except for vision: There is a  large, dense, cataract in the left eye. Fundus could not be visualized on the left. I could not get a good look at the optic disc on the right., motor strength 5 over 5, reflexes 1+ symmetric, upper body coordination normal, gait normal, sensation intact to vibration over the fingertips by tuning fork exam Skin: No rash or ecchymosis  Lab Results: CBC W/Diff    Component Value Date/Time   WBC 10.6* 01/08/2013 1106   WBC 8.7 07/27/2011 0404   RBC 3.77* 01/08/2013 1106   RBC 3.06* 07/27/2011 0404   RBC 2.22* 07/25/2011 0320   HGB 11.0* 01/08/2013 1106   HGB 9.7* 07/27/2011 0404   HCT 33.7* 01/08/2013 1106   HCT 27.1* 07/27/2011 0404   PLT 449* 01/08/2013 1106   PLT 205 07/27/2011 0404   MCV 89.4 01/08/2013 1106   MCV 88.6 07/27/2011 0404   MCH 29.3 01/08/2013 1106   MCH 31.7 07/27/2011 0404   MCHC 32.7 01/08/2013 1106   MCHC 35.8 07/27/2011 0404   RDW 15.3* 01/08/2013 1106   RDW 16.3* 07/27/2011 0404   LYMPHSABS 2.3 01/08/2013 1106   LYMPHSABS 1.2 07/25/2011 0320   MONOABS 0.6 01/08/2013 1106   MONOABS 0.9 07/25/2011 0320   EOSABS 0.3 01/08/2013 1106   EOSABS 0.1 07/25/2011 0320   BASOSABS 0.1 01/08/2013 1106   BASOSABS 0.1 07/25/2011 0320     Chemistry      Component Value Date/Time   NA 139 01/08/2013 1106   NA 132* 04/02/2012 1705   K 4.3 01/08/2013 1106   K 4.0 04/02/2012 1705   CL 107 06/29/2012 1131   CL 92* 04/02/2012 1705   CO2 22 01/08/2013 1106   CO2 29 04/02/2012 1705   BUN 22.7 01/08/2013 1106   BUN 21 04/02/2012 1705   CREATININE 1.7* 01/08/2013 1106   CREATININE 1.89* 04/02/2012 1705   CREATININE 2.03* 12/14/2010 1114      Component Value Date/Time   CALCIUM 10.1 01/08/2013 1106   CALCIUM 8.0* 04/02/2012 1705   ALKPHOS 116 01/08/2013 1106   ALKPHOS 182* 08/05/2011 1100   AST 27 01/08/2013 1106   AST 31 08/05/2011 1100   ALT 17 01/08/2013 1106   ALT 45 08/05/2011 1100   BILITOT 0.39 01/08/2013 1106   BILITOT 0.5 08/05/2011 1100    IgG: 705 mg percent on 09/28/2012 with no monoclonal protein on IFE. Repeat value  today pending Kappa serum free light chain normal at 1.55, lambda 1.48, ratio 1.05(09/28/2012).  Impression:   #1. IgG kappa multiple myeloma  He remains in a clinical remission off treatment now for 6 months.  Given the difficulties I have had outlined above, and the fact that his lab parameters remained within normal range with no recurrent monoclonal spike, I will continue close observation alone.   #2. Idiopathic diarrheal illness.  This lasted for over one year. Finally improving.    #3. Severe hypokalemia related to #2.  Potassium now normal at 4.3. He had already cut his potassium tablets down to one daily. I told him to continue this dose.   #4. Chronic renal insufficiency.  creatinine today at his baseline of 1.7   #5. Essential hypertension  Blood pressure under good control on  current medication   CC: Patient Care Team: Grant Fontana, MD as PCP - General (Internal Medicine)   Annia Belt, MD 1/2/20154:22 PM

## 2013-01-08 NOTE — Telephone Encounter (Signed)
gv and printed appt sched and avs for pt for Feb and April 2015....gv order to  [facility].

## 2013-01-12 LAB — KAPPA/LAMBDA LIGHT CHAINS
KAPPA FREE LGHT CHN: 2.48 mg/dL — AB (ref 0.33–1.94)
Kappa:Lambda Ratio: 1.23 (ref 0.26–1.65)
Lambda Free Lght Chn: 2.02 mg/dL (ref 0.57–2.63)

## 2013-01-12 LAB — IMMUNOFIXATION ELECTROPHORESIS
IGG (IMMUNOGLOBIN G), SERUM: 1010 mg/dL (ref 650–1600)
IgA: 169 mg/dL (ref 68–379)
IgM, Serum: 63 mg/dL (ref 41–251)
TOTAL PROTEIN, SERUM ELECTROPHOR: 6.7 g/dL (ref 6.0–8.3)

## 2013-01-21 ENCOUNTER — Other Ambulatory Visit: Payer: Self-pay | Admitting: Nurse Practitioner

## 2013-01-23 ENCOUNTER — Telehealth: Payer: Self-pay | Admitting: Hematology and Oncology

## 2013-01-23 NOTE — Telephone Encounter (Signed)
Mailed appt to patient for April 2015 , a former Dr. Beryle Beams pt

## 2013-03-06 ENCOUNTER — Encounter: Payer: Self-pay | Admitting: Oncology

## 2013-03-10 ENCOUNTER — Other Ambulatory Visit: Payer: Self-pay | Admitting: Oncology

## 2013-03-12 ENCOUNTER — Other Ambulatory Visit: Payer: Self-pay | Admitting: Oncology

## 2013-03-12 DIAGNOSIS — E876 Hypokalemia: Secondary | ICD-10-CM

## 2013-03-27 ENCOUNTER — Other Ambulatory Visit: Payer: Self-pay | Admitting: Oncology

## 2013-04-08 ENCOUNTER — Ambulatory Visit (HOSPITAL_BASED_OUTPATIENT_CLINIC_OR_DEPARTMENT_OTHER): Payer: Medicare Other | Admitting: Hematology and Oncology

## 2013-04-08 ENCOUNTER — Telehealth: Payer: Self-pay | Admitting: Hematology and Oncology

## 2013-04-08 ENCOUNTER — Ambulatory Visit: Payer: Medicare Other | Admitting: Nurse Practitioner

## 2013-04-08 ENCOUNTER — Encounter: Payer: Self-pay | Admitting: Hematology and Oncology

## 2013-04-08 VITALS — BP 147/60 | HR 67 | Temp 98.2°F | Resp 20 | Ht 71.0 in | Wt 160.6 lb

## 2013-04-08 DIAGNOSIS — N189 Chronic kidney disease, unspecified: Secondary | ICD-10-CM

## 2013-04-08 DIAGNOSIS — R197 Diarrhea, unspecified: Secondary | ICD-10-CM

## 2013-04-08 DIAGNOSIS — G8929 Other chronic pain: Secondary | ICD-10-CM

## 2013-04-08 DIAGNOSIS — C9000 Multiple myeloma not having achieved remission: Secondary | ICD-10-CM

## 2013-04-08 DIAGNOSIS — D649 Anemia, unspecified: Secondary | ICD-10-CM

## 2013-04-08 DIAGNOSIS — C9001 Multiple myeloma in remission: Secondary | ICD-10-CM

## 2013-04-08 DIAGNOSIS — M549 Dorsalgia, unspecified: Secondary | ICD-10-CM

## 2013-04-08 NOTE — Telephone Encounter (Signed)
gv adn printed appt sched anda vs for pt for Aug °

## 2013-04-08 NOTE — Progress Notes (Signed)
[facility] FOLLOW-UP NOTE  Patient Care Team: Lucianne Lei, MD as PCP - General (Family Medicine)  CHIEF COMPLAINTS/PURPOSE OF VISIT:  IgG kappa multiple myeloma  HISTORY OF PRESENTING ILLNESS:  Calvin Mccormick 68 y.o. male is here because of diagnosis of IgG kappa multiple myeloma. I reviewed his records as outlined by Dr. Azucena Freed note dated 01/08/2013. He was treated with Revlimid, Velcade and dexamethasone with good response to treatment. The patient developed severe illness requiring discontinuation of his chemotherapy in 2013. He was restarted back on Revlimid around October 2013. Unfortunately, starting around March of 2014 he had significant weight loss and diarrhea and his treatment was subsequently discontinued. He was being observed since then.  He denies history of abnormal bone pain or bone fracture. He has chronic back pain. Patient denies recent history of recurrent infection or atypical infections such as shingles of meningitis. Denies chills, night sweats, anorexia or abnormal weight loss.  he still has chronic diarrhea 3-4 times a day, resolved with taking Imodium. MEDICAL HISTORY:  Past Medical History  Diagnosis Date  . Anemia 11/27/2010  . Hypertension, benign essential, goal below 140/90 11/27/2010  . Multiple myeloma without mention of remission 12/12/2010  . Multiple myeloma without mention of remission 12/12/2010  . CKD (chronic kidney disease), stage III 02/05/2011  . Hyperlipemia   . Diarrhea 06/17/2011    Hospital admission 05/30/11 velcade toxicity? Infectious?  . Chronic renal insufficiency, stage III (moderate) 06/17/2011    Due to myeloma & HTN  . Hypomagnesemia 07/29/2011  . Hypokalemia with normal acid-base balance 07/29/2011    SURGICAL HISTORY: Past Surgical History  Procedure Laterality Date  . Colonoscopy  07/26/2011    Procedure: COLONOSCOPY;  Surgeon: Cleotis Nipper, MD;  Location: [facility];  Service: Endoscopy;  Laterality:  N/A;    SOCIAL HISTORY: History   Social History  . Marital Status: Married    Spouse Name: N/A    Number of Children: N/A  . Years of Education: N/A   Occupational History  . Not on file.   Social History Main Topics  . Smoking status: Never Smoker   . Smokeless tobacco: Never Used  . Alcohol Use: No  . Drug Use: No  . Sexual Activity: Not Currently   Other Topics Concern  . Not on file   Social History Narrative  . No narrative on file    FAMILY HISTORY: Family History  Problem Relation Age of Onset  . 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  MEDICATIONS:  Current Outpatient Prescriptions  Medication Sig Dispense Refill  . amLODipine (NORVASC) 10 MG tablet TAKE 1 TABLET BY MOUTH EVERY DAY  30 tablet  1  . atorvastatin (LIPITOR) 20 MG tablet Take 20 mg by mouth at bedtime.       Marland Kitchen atropine 1 % ophthalmic solution Place 1 drop into the right eye 2 (two) times daily.      . Ensure Plus (ENSURE PLUS) LIQD Take 1 Can by mouth 3 (three) times daily between meals.  24 Can  prn  . folic acid (FOLVITE) 1 MG tablet Take 1 mg by mouth daily.        . hydrochlorothiazide (HYDRODIURIL) 25 MG tablet TAKE 1 TABLET BY MOUTH EVERY DAY  90 tablet  4  . KLOR-CON M20 20 MEQ tablet TAKE 1 TABLET BY MOUTH EVERY DAY  90 tablet  1  . loperamide (IMODIUM A-D)  2 MG tablet 1 po after each watery BM.  Maximum 6 per 24hrs.  30 tablet  PRN  . ondansetron (ZOFRAN-ODT) 8 MG disintegrating tablet LET 1 TABLET DISSOLVE IN MOUTH AS DIRECTED EVERY 8 HOURS AS NEEDED FOR NAUSEA  20 tablet  2  . prednisoLONE acetate (PRED FORTE) 1 % ophthalmic suspension Place 1 drop into the right eye 4 (four) times daily.       Marland Kitchen allopurinol (ZYLOPRIM) 100 MG tablet Take by mouth 2 (two) times daily.       . vitamin E (VITAMIN E) 400 UNIT capsule Take 400 Units by mouth daily.         No current facility-administered medications for this visit.    REVIEW OF SYSTEMS:    Eyes: Denies blurriness of vision, double vision or watery eyes Ears, nose, mouth, throat, and face: Denies mucositis or sore throat Respiratory: Denies cough, dyspnea or wheezes Cardiovascular: Denies palpitation, chest discomfort. He did noticed bilateral lower extremity swelling Skin: Denies abnormal skin rashes Lymphatics: Denies new lymphadenopathy or easy bruising Neurological:Denies numbness, tingling or new weaknesses Behavioral/Psych: Mood is stable, no new changes  All other systems were reviewed with the patient and are negative.  PHYSICAL EXAMINATION: ECOG PERFORMANCE STATUS: 1 - Symptomatic but completely ambulatory  Filed Vitals:   04/08/13 1256  BP: 147/60  Pulse: 67  Temp: 98.2 F (36.8 C)  Resp: 20   Filed Weights   04/08/13 1256  Weight: 160 lb 9.6 oz (72.848 kg)    GENERAL:alert, no distress and comfortable. He looks thin but not cachectic SKIN: skin color, texture, turgor are normal, no rashes or significant lesions EYES: normal, conjunctiva are pink and non-injected, sclera clear OROPHARYNX:no exudate, no erythema and lips, buccal mucosa, and tongue normal  NECK: supple, thyroid normal size, non-tender, without nodularity LYMPH:  no palpable lymphadenopathy in the cervical, axillary or inguinal LUNGS: clear to auscultation and percussion with normal breathing effort HEART: regular rate & rhythm and no murmurs with mild bilateral lower extremity edema ABDOMEN:abdomen soft, non-tender and normal bowel sounds Musculoskeletal:no cyanosis of digits and no clubbing  PSYCH: alert & oriented x 3 with fluent speech NEURO: no focal motor/sensory deficits  LABORATORY DATA:  I have reviewed the data as listed Lab Results  Component Value Date   WBC 10.6* 01/08/2013   HGB 11.0* 01/08/2013   HCT 33.7* 01/08/2013   MCV 89.4 01/08/2013   PLT 449* 01/08/2013   PLAN:  #1 MGUS His last blood work suggests that the patient has achieved near complete response. I recommend  observation and I will see him back in 4 months with history, physical examination and repeat blood work #2 chronic kidney disease Continue close monitoring #3 chronic diarrhea Cause is unknown. It is well controlled with Imodium #4 chronic anemia This is likely anemia of chronic disease. The patient denies recent history of bleeding such as epistaxis, hematuria or hematochezia. He is asymptomatic from the anemia. We will observe for now.  He does not require transfusion now.  #5 chronic back pain I recommend vitamin D supplementation.    Orders Placed This Encounter  Procedures  . CBC with Differential    Standing Status: Future     Number of Occurrences:      Standing Expiration Date: 04/08/2014  . Comprehensive metabolic panel    Standing Status: Future     Number of Occurrences:      Standing Expiration Date: 04/08/2014  . SPEP & IFE with QIG  Standing Status: Future     Number of Occurrences:      Standing Expiration Date: 04/08/2014  . Kappa/lambda light chains    Standing Status: Future     Number of Occurrences:      Standing Expiration Date: 04/08/2014    All questions were answered. The patient knows to call the clinic with any problems, questions or concerns. I spent 25 minutes counseling the patient face to face. The total time spent in the appointment was 30 minutes and more than 50% was on counseling.     [facility], [place], MD 04/08/2013 4:39 PM

## 2013-05-16 ENCOUNTER — Other Ambulatory Visit: Payer: Self-pay | Admitting: Oncology

## 2013-07-12 ENCOUNTER — Other Ambulatory Visit: Payer: Self-pay | Admitting: Oncology

## 2013-07-14 ENCOUNTER — Other Ambulatory Visit: Payer: Self-pay | Admitting: Oncology

## 2013-08-09 ENCOUNTER — Other Ambulatory Visit (HOSPITAL_BASED_OUTPATIENT_CLINIC_OR_DEPARTMENT_OTHER): Payer: Medicare Other

## 2013-08-09 DIAGNOSIS — C9001 Multiple myeloma in remission: Secondary | ICD-10-CM

## 2013-08-09 DIAGNOSIS — C9000 Multiple myeloma not having achieved remission: Secondary | ICD-10-CM

## 2013-08-09 LAB — COMPREHENSIVE METABOLIC PANEL (CC13)
ALBUMIN: 3.6 g/dL (ref 3.5–5.0)
ALT: 14 U/L (ref 0–55)
AST: 23 U/L (ref 5–34)
Alkaline Phosphatase: 129 U/L (ref 40–150)
Anion Gap: 7 mEq/L (ref 3–11)
BILIRUBIN TOTAL: 0.54 mg/dL (ref 0.20–1.20)
BUN: 18 mg/dL (ref 7.0–26.0)
CO2: 24 mEq/L (ref 22–29)
Calcium: 10 mg/dL (ref 8.4–10.4)
Chloride: 106 mEq/L (ref 98–109)
Creatinine: 1.5 mg/dL — ABNORMAL HIGH (ref 0.7–1.3)
GLUCOSE: 86 mg/dL (ref 70–140)
Potassium: 4.5 mEq/L (ref 3.5–5.1)
Sodium: 138 mEq/L (ref 136–145)
Total Protein: 7.2 g/dL (ref 6.4–8.3)

## 2013-08-09 LAB — CBC WITH DIFFERENTIAL/PLATELET
BASO%: 1.2 % (ref 0.0–2.0)
BASOS ABS: 0.1 10*3/uL (ref 0.0–0.1)
EOS ABS: 0.2 10*3/uL (ref 0.0–0.5)
EOS%: 2.1 % (ref 0.0–7.0)
HCT: 34.5 % — ABNORMAL LOW (ref 38.4–49.9)
HEMOGLOBIN: 11.2 g/dL — AB (ref 13.0–17.1)
LYMPH%: 32.3 % (ref 14.0–49.0)
MCH: 29.4 pg (ref 27.2–33.4)
MCHC: 32.3 g/dL (ref 32.0–36.0)
MCV: 90.8 fL (ref 79.3–98.0)
MONO#: 0.6 10*3/uL (ref 0.1–0.9)
MONO%: 6.3 % (ref 0.0–14.0)
NEUT#: 5.1 10*3/uL (ref 1.5–6.5)
NEUT%: 58.1 % (ref 39.0–75.0)
PLATELETS: 427 10*3/uL — AB (ref 140–400)
RBC: 3.8 10*6/uL — ABNORMAL LOW (ref 4.20–5.82)
RDW: 16 % — ABNORMAL HIGH (ref 11.0–14.6)
WBC: 8.7 10*3/uL (ref 4.0–10.3)
lymph#: 2.8 10*3/uL (ref 0.9–3.3)

## 2013-08-11 LAB — SPEP & IFE WITH QIG
Albumin ELP: 53.5 % — ABNORMAL LOW (ref 55.8–66.1)
Alpha-1-Globulin: 6.7 % — ABNORMAL HIGH (ref 2.9–4.9)
Alpha-2-Globulin: 11.4 % (ref 7.1–11.8)
BETA GLOBULIN: 7.5 % — AB (ref 4.7–7.2)
Beta 2: 5.5 % (ref 3.2–6.5)
Gamma Globulin: 15.4 % (ref 11.1–18.8)
IGA: 217 mg/dL (ref 68–379)
IGG (IMMUNOGLOBIN G), SERUM: 1130 mg/dL (ref 650–1600)
IgM, Serum: 68 mg/dL (ref 41–251)
TOTAL PROTEIN, SERUM ELECTROPHOR: 6.8 g/dL (ref 6.0–8.3)

## 2013-08-11 LAB — KAPPA/LAMBDA LIGHT CHAINS
Kappa free light chain: 2.87 mg/dL — ABNORMAL HIGH (ref 0.33–1.94)
Kappa:Lambda Ratio: 1.81 — ABNORMAL HIGH (ref 0.26–1.65)
LAMBDA FREE LGHT CHN: 1.59 mg/dL (ref 0.57–2.63)

## 2013-08-16 ENCOUNTER — Encounter: Payer: Self-pay | Admitting: Hematology and Oncology

## 2013-08-16 ENCOUNTER — Telehealth: Payer: Self-pay | Admitting: Hematology and Oncology

## 2013-08-16 ENCOUNTER — Ambulatory Visit (HOSPITAL_BASED_OUTPATIENT_CLINIC_OR_DEPARTMENT_OTHER): Payer: Medicare Other | Admitting: Hematology and Oncology

## 2013-08-16 VITALS — BP 145/65 | HR 66 | Temp 98.3°F | Resp 18 | Ht 71.0 in | Wt 160.9 lb

## 2013-08-16 DIAGNOSIS — D631 Anemia in chronic kidney disease: Secondary | ICD-10-CM

## 2013-08-16 DIAGNOSIS — N183 Chronic kidney disease, stage 3 unspecified: Secondary | ICD-10-CM

## 2013-08-16 DIAGNOSIS — N039 Chronic nephritic syndrome with unspecified morphologic changes: Secondary | ICD-10-CM

## 2013-08-16 DIAGNOSIS — C9000 Multiple myeloma not having achieved remission: Secondary | ICD-10-CM

## 2013-08-16 NOTE — Assessment & Plan Note (Signed)
This is chronic in nature. It is stable. Recommend observation only.   

## 2013-08-16 NOTE — Assessment & Plan Note (Signed)
Clinically, he has no signs of progression. M spike remained undetectable. The serum light chain is mildly elevated but overall, the patient has minimal residual disease. I plan to his appointment to 6 months along with history, physical examination, blood work and Marine scientist. Technically, this patient should be prescribed intravenous bisphosphonates but he never received it. I recommend he sees a dentist with plan for future IV Zometa. I reinforced the importance of calcium with vitamin D supplements.

## 2013-08-16 NOTE — Assessment & Plan Note (Signed)
This is likely anemia of chronic disease. The patient denies recent history of bleeding such as epistaxis, hematuria or hematochezia. He is asymptomatic from the anemia. We will observe for now.  

## 2013-08-16 NOTE — Progress Notes (Signed)
[facility] OFFICE PROGRESS NOTE  Patient Care Team: Lucianne Lei, MD as PCP - General (Family Medicine)  SUMMARY OF ONCOLOGIC HISTORY: He was treated with Revlimid, Velcade and dexamethasone with good response to treatment. The patient developed severe illness requiring discontinuation of his chemotherapy in 2013. He was restarted back on Revlimid around October 2013. Unfortunately, starting around March of 2014 he had significant weight loss and diarrhea and his treatment was subsequently discontinued. He was being observed since then.  INTERVAL HISTORY: Please see below for problem oriented charting. He has chronic diarrhea, controlled with Imodium. His weight has been stable. Denies new bone pain. Denies recent infection.  REVIEW OF SYSTEMS:   Constitutional: Denies fevers, chills or abnormal weight loss Eyes: Denies blurriness of vision Ears, nose, mouth, throat, and face: Denies mucositis or sore throat Respiratory: Denies cough, dyspnea or wheezes Cardiovascular: Denies palpitation, chest discomfort or lower extremity swelling Skin: Denies abnormal skin rashes Lymphatics: Denies new lymphadenopathy or easy bruising Neurological:Denies numbness, tingling or new weaknesses Behavioral/Psych: Mood is stable, no new changes  All other systems were reviewed with the patient and are negative.  I have reviewed the past medical history, past surgical history, social history and family history with the patient and they are unchanged from previous note.  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  MEDICATIONS:  Current Outpatient Prescriptions  Medication Sig Dispense Refill  . allopurinol (ZYLOPRIM) 100 MG tablet Take by mouth 2 (two) times daily.       Marland Kitchen amLODipine (NORVASC) 10 MG tablet TAKE 1 TABLET BY MOUTH EVERY DAY  30 tablet  1  . atorvastatin (LIPITOR) 20 MG tablet Take 20 mg by mouth at bedtime.       . dorzolamide-timolol (COSOPT) 22.3-6.8 MG/ML  ophthalmic solution       . folic acid (FOLVITE) 1 MG tablet Take 1 mg by mouth daily.        . hydrochlorothiazide (HYDRODIURIL) 25 MG tablet TAKE 1 TABLET BY MOUTH EVERY DAY  90 tablet  4  . KLOR-CON M20 20 MEQ tablet TAKE 1 TABLET BY MOUTH EVERY DAY  90 tablet  1  . loperamide (IMODIUM A-D) 2 MG tablet 1 po after each watery BM.  Maximum 6 per 24hrs.  30 tablet  PRN  . ondansetron (ZOFRAN-ODT) 8 MG disintegrating tablet LET 1 TABLET DISSOLVE IN MOUTH AS DIRECTED EVERY 8 HOURS AS NEEDED FOR NAUSEA  20 tablet  2  . vitamin E (VITAMIN E) 400 UNIT capsule Take 400 Units by mouth daily.         No current facility-administered medications for this visit.    PHYSICAL EXAMINATION: ECOG PERFORMANCE STATUS: 1 - Symptomatic but completely ambulatory  Filed Vitals:   08/16/13 0904  BP: 145/65  Pulse: 66  Temp: 98.3 F (36.8 C)  Resp: 18   Filed Weights   08/16/13 0904  Weight: 160 lb 14.4 oz (72.984 kg)    GENERAL:alert, no distress and comfortable SKIN: skin color, texture, turgor are normal, no rashes or significant lesions EYES: normal, Conjunctiva are pink and non-injected, sclera clear OROPHARYNX:no exudate, no erythema and lips, buccal mucosa, and tongue normal. Poor dentition is noted  NECK: supple, thyroid normal size, non-tender, without nodularity LYMPH:  no palpable lymphadenopathy in the cervical, axillary or inguinal LUNGS: clear to auscultation and percussion with normal breathing effort HEART: regular rate & rhythm and no murmurs and no lower extremity edema ABDOMEN:abdomen soft, non-tender and normal bowel sounds Musculoskeletal:no cyanosis  of digits and no clubbing  NEURO: alert & oriented x 3 with fluent speech, no focal motor/sensory deficits  LABORATORY DATA:  I have reviewed the data as listed    Component Value Date/Time   NA 138 08/09/2013 1103   NA 132* 04/02/2012 1705   K 4.5 08/09/2013 1103   K 4.0 04/02/2012 1705   CL 107 06/29/2012 1131   CL 92* 04/02/2012  1705   CO2 24 08/09/2013 1103   CO2 29 04/02/2012 1705   GLUCOSE 86 08/09/2013 1103   GLUCOSE 143* 06/29/2012 1131   GLUCOSE 105* 04/02/2012 1705   BUN 18.0 08/09/2013 1103   BUN 21 04/02/2012 1705   CREATININE 1.5* 08/09/2013 1103   CREATININE 1.89* 04/02/2012 1705   CREATININE 2.03* 12/14/2010 1114   CALCIUM 10.0 08/09/2013 1103   CALCIUM 8.0* 04/02/2012 1705   PROT 7.2 08/09/2013 1103   PROT 4.9* 08/05/2011 1100   ALBUMIN 3.6 08/09/2013 1103   ALBUMIN 2.4* 08/05/2011 1100   AST 23 08/09/2013 1103   AST 31 08/05/2011 1100   ALT 14 08/09/2013 1103   ALT 45 08/05/2011 1100   ALKPHOS 129 08/09/2013 1103   ALKPHOS 182* 08/05/2011 1100   BILITOT 0.54 08/09/2013 1103   BILITOT 0.5 08/05/2011 1100   GFRNONAA 84* 07/28/2011 0341   GFRAA >90 07/28/2011 0341    No results found for this basename: SPEP,  UPEP,   kappa and lambda light chains    Lab Results  Component Value Date   WBC 8.7 08/09/2013   NEUTROABS 5.1 08/09/2013   HGB 11.2* 08/09/2013   HCT 34.5* 08/09/2013   MCV 90.8 08/09/2013   PLT 427* 08/09/2013      Chemistry      Component Value Date/Time   NA 138 08/09/2013 1103   NA 132* 04/02/2012 1705   K 4.5 08/09/2013 1103   K 4.0 04/02/2012 1705   CL 107 06/29/2012 1131   CL 92* 04/02/2012 1705   CO2 24 08/09/2013 1103   CO2 29 04/02/2012 1705   BUN 18.0 08/09/2013 1103   BUN 21 04/02/2012 1705   CREATININE 1.5* 08/09/2013 1103   CREATININE 1.89* 04/02/2012 1705   CREATININE 2.03* 12/14/2010 1114      Component Value Date/Time   CALCIUM 10.0 08/09/2013 1103   CALCIUM 8.0* 04/02/2012 1705   ALKPHOS 129 08/09/2013 1103   ALKPHOS 182* 08/05/2011 1100   AST 23 08/09/2013 1103   AST 31 08/05/2011 1100   ALT 14 08/09/2013 1103   ALT 45 08/05/2011 1100   BILITOT 0.54 08/09/2013 1103   BILITOT 0.5 08/05/2011 1100      ASSESSMENT & PLAN:  Multiple myeloma, without mention of having achieved remission Clinically, he has no signs of progression. M spike remained undetectable. The serum light chain is mildly elevated but overall,  the patient has minimal residual disease. I plan to his appointment to 6 months along with history, physical examination, blood work and Marine scientist. Technically, this patient should be prescribed intravenous bisphosphonates but he never received it. I recommend he sees a dentist with plan for future IV Zometa. I reinforced the importance of calcium with vitamin D supplements.  CKD (chronic kidney disease), stage III This is chronic in nature. It is stable. Recommend observation only.  Anemia in chronic kidney disease(285.21) This is likely anemia of chronic disease. The patient denies recent history of bleeding such as epistaxis, hematuria or hematochezia. He is asymptomatic from the anemia. We will observe for now.  Orders Placed This Encounter  Procedures  . DG Bone Survey Met    Standing Status: Future     Number of Occurrences:      Standing Expiration Date: 10/16/2014    Order Specific Question:  Reason for Exam (SYMPTOM  OR DIAGNOSIS REQUIRED)    Answer:  staging myeloma    Order Specific Question:  Preferred imaging location?    Answer:  [facility]  . CBC with Differential    Standing Status: Future     Number of Occurrences:      Standing Expiration Date: 10/16/2014  . Comprehensive metabolic panel    Standing Status: Future     Number of Occurrences:      Standing Expiration Date: 10/16/2014  . Lactate dehydrogenase    Standing Status: Future     Number of Occurrences:      Standing Expiration Date: 10/16/2014  . SPEP & IFE with QIG    Standing Status: Future     Number of Occurrences:      Standing Expiration Date: 10/16/2014  . Kappa/lambda light chains    Standing Status: Future     Number of Occurrences:      Standing Expiration Date: 10/16/2014  . Beta 2 microglobulin, serum    Standing Status: Future     Number of Occurrences:      Standing Expiration Date: 10/16/2014  . Magnesium    Standing Status: Future     Number of Occurrences:       Standing Expiration Date: 10/16/2014  . Phosphorus    Standing Status: Future     Number of Occurrences:      Standing Expiration Date: 10/16/2014   All questions were answered. The patient knows to call the clinic with any problems, questions or concerns. No barriers to learning was detected. I spent 15 minutes counseling the patient face to face. The total time spent in the appointment was 20 minutes and more than 50% was on counseling and review of test results     [facility], [place], MD 08/16/2013 9:23 AM

## 2013-08-16 NOTE — Telephone Encounter (Signed)
Pt confirmed labs/ov per 08/10 POF, gave pt AVS..Marland KitchenKJ

## 2013-09-10 ENCOUNTER — Other Ambulatory Visit: Payer: Self-pay | Admitting: Oncology

## 2013-09-10 DIAGNOSIS — C9000 Multiple myeloma not having achieved remission: Secondary | ICD-10-CM

## 2013-09-10 DIAGNOSIS — E876 Hypokalemia: Secondary | ICD-10-CM

## 2013-10-18 ENCOUNTER — Emergency Department (HOSPITAL_COMMUNITY): Payer: Medicare Other

## 2013-10-18 ENCOUNTER — Encounter (HOSPITAL_COMMUNITY): Payer: Self-pay | Admitting: Emergency Medicine

## 2013-10-18 ENCOUNTER — Emergency Department (HOSPITAL_COMMUNITY)
Admission: EM | Admit: 2013-10-18 | Discharge: 2013-10-18 | Disposition: A | Discharge status: Home / Self Care | Payer: Medicare Other | Attending: Emergency Medicine | Admitting: Emergency Medicine

## 2013-10-18 DIAGNOSIS — S3982XA Other specified injuries of lower back, initial encounter: Secondary | ICD-10-CM | POA: Diagnosis present

## 2013-10-18 DIAGNOSIS — Z79899 Other long term (current) drug therapy: Secondary | ICD-10-CM | POA: Diagnosis not present

## 2013-10-18 DIAGNOSIS — Y9389 Activity, other specified: Secondary | ICD-10-CM | POA: Insufficient documentation

## 2013-10-18 DIAGNOSIS — Y92410 Unspecified street and highway as the place of occurrence of the external cause: Secondary | ICD-10-CM | POA: Insufficient documentation

## 2013-10-18 DIAGNOSIS — E785 Hyperlipidemia, unspecified: Secondary | ICD-10-CM | POA: Insufficient documentation

## 2013-10-18 DIAGNOSIS — M542 Cervicalgia: Secondary | ICD-10-CM

## 2013-10-18 DIAGNOSIS — I129 Hypertensive chronic kidney disease with stage 1 through stage 4 chronic kidney disease, or unspecified chronic kidney disease: Secondary | ICD-10-CM | POA: Diagnosis not present

## 2013-10-18 DIAGNOSIS — S29012A Strain of muscle and tendon of back wall of thorax, initial encounter: Secondary | ICD-10-CM | POA: Insufficient documentation

## 2013-10-18 DIAGNOSIS — Z8579 Personal history of other malignant neoplasms of lymphoid, hematopoietic and related tissues: Secondary | ICD-10-CM | POA: Diagnosis not present

## 2013-10-18 DIAGNOSIS — S199XXA Unspecified injury of neck, initial encounter: Secondary | ICD-10-CM | POA: Insufficient documentation

## 2013-10-18 DIAGNOSIS — S39012A Strain of muscle, fascia and tendon of lower back, initial encounter: Secondary | ICD-10-CM | POA: Insufficient documentation

## 2013-10-18 DIAGNOSIS — D649 Anemia, unspecified: Secondary | ICD-10-CM | POA: Insufficient documentation

## 2013-10-18 DIAGNOSIS — M549 Dorsalgia, unspecified: Secondary | ICD-10-CM

## 2013-10-18 DIAGNOSIS — N183 Chronic kidney disease, stage 3 (moderate): Secondary | ICD-10-CM | POA: Insufficient documentation

## 2013-10-18 DIAGNOSIS — S29019A Strain of muscle and tendon of unspecified wall of thorax, initial encounter: Secondary | ICD-10-CM

## 2013-10-18 MED ORDER — TRAMADOL HCL 50 MG PO TABS: 50.0000 mg | ORAL_TABLET | Freq: Four times a day (QID) | ORAL | Status: DC | PRN

## 2013-10-18 MED ORDER — TRAMADOL HCL 50 MG PO TABS
50.0000 mg | ORAL_TABLET | Freq: Once | ORAL | Status: AC
  Administered 2013-10-18: 50 mg via ORAL
  Filled 2013-10-18: qty 1
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
## 2013-10-18 NOTE — ED Notes (Signed)
Pt ambulated to restroom and back to room without any complaints.
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
## 2013-12-23 ENCOUNTER — Other Ambulatory Visit: Payer: Self-pay | Admitting: Nurse Practitioner

## 2014-02-16 ENCOUNTER — Ambulatory Visit (HOSPITAL_COMMUNITY)
Admission: RE | Admit: 2014-02-16 | Discharge: 2014-02-16 | Disposition: A | Discharge status: Home / Self Care | Payer: Medicare PPO | Source: Ambulatory Visit | Attending: Hematology and Oncology | Admitting: Hematology and Oncology

## 2014-02-16 ENCOUNTER — Other Ambulatory Visit (HOSPITAL_BASED_OUTPATIENT_CLINIC_OR_DEPARTMENT_OTHER): Payer: 59

## 2014-02-16 DIAGNOSIS — M47892 Other spondylosis, cervical region: Secondary | ICD-10-CM | POA: Insufficient documentation

## 2014-02-16 DIAGNOSIS — M419 Scoliosis, unspecified: Secondary | ICD-10-CM | POA: Diagnosis not present

## 2014-02-16 DIAGNOSIS — N183 Chronic kidney disease, stage 3 unspecified: Secondary | ICD-10-CM

## 2014-02-16 DIAGNOSIS — C9000 Multiple myeloma not having achieved remission: Secondary | ICD-10-CM

## 2014-02-16 DIAGNOSIS — M47895 Other spondylosis, thoracolumbar region: Secondary | ICD-10-CM | POA: Diagnosis not present

## 2014-02-16 LAB — COMPREHENSIVE METABOLIC PANEL (CC13)
ALK PHOS: 112 U/L (ref 40–150)
ALT: 16 U/L (ref 0–55)
AST: 20 U/L (ref 5–34)
Albumin: 3.7 g/dL (ref 3.5–5.0)
Anion Gap: 7 mEq/L (ref 3–11)
BUN: 26.1 mg/dL — AB (ref 7.0–26.0)
CO2: 23 mEq/L (ref 22–29)
Calcium: 9.6 mg/dL (ref 8.4–10.4)
Chloride: 106 mEq/L (ref 98–109)
Creatinine: 1.7 mg/dL — ABNORMAL HIGH (ref 0.7–1.3)
EGFR: 45 mL/min/{1.73_m2} — ABNORMAL LOW (ref >90)
GLUCOSE: 89 mg/dL (ref 70–140)
Potassium: 4.7 mEq/L (ref 3.5–5.1)
SODIUM: 136 meq/L (ref 136–145)
TOTAL PROTEIN: 7.6 g/dL (ref 6.4–8.3)
Total Bilirubin: 0.46 mg/dL (ref 0.20–1.20)

## 2014-02-16 LAB — CBC WITH DIFFERENTIAL/PLATELET
BASO%: 1.1 % (ref 0.0–2.0)
BASOS ABS: 0.1 10*3/uL (ref 0.0–0.1)
EOS ABS: 0.4 10*3/uL (ref 0.0–0.5)
EOS%: 3.4 % (ref 0.0–7.0)
HEMATOCRIT: 33 % — AB (ref 38.4–49.9)
HEMOGLOBIN: 10.7 g/dL — AB (ref 13.0–17.1)
LYMPH%: 29.7 % (ref 14.0–49.0)
MCH: 29.8 pg (ref 27.2–33.4)
MCHC: 32.3 g/dL (ref 32.0–36.0)
MCV: 92.5 fL (ref 79.3–98.0)
MONO#: 0.7 10*3/uL (ref 0.1–0.9)
MONO%: 6.4 % (ref 0.0–14.0)
NEUT%: 59.4 % (ref 39.0–75.0)
NEUTROS ABS: 6.2 10*3/uL (ref 1.5–6.5)
PLATELETS: 347 10*3/uL (ref 140–400)
RBC: 3.57 10*6/uL — AB (ref 4.20–5.82)
RDW: 15.7 % — ABNORMAL HIGH (ref 11.0–14.6)
WBC: 10.4 10*3/uL — AB (ref 4.0–10.3)
lymph#: 3.1 10*3/uL (ref 0.9–3.3)

## 2014-02-16 LAB — LACTATE DEHYDROGENASE (CC13): LDH: 177 U/L (ref 125–245)

## 2014-02-16 LAB — MAGNESIUM (CC13): MAGNESIUM: 1.9 mg/dL (ref 1.5–2.5)

## 2014-02-18 LAB — SPEP & IFE WITH QIG
ALBUMIN ELP: 51 % — AB (ref 55.8–66.1)
ALPHA-1-GLOBULIN: 4.7 % (ref 2.9–4.9)
Alpha-2-Globulin: 10 % (ref 7.1–11.8)
Beta 2: 5.3 % (ref 3.2–6.5)
Beta Globulin: 6.2 % (ref 4.7–7.2)
Gamma Globulin: 22.8 % — ABNORMAL HIGH (ref 11.1–18.8)
IGG (IMMUNOGLOBIN G), SERUM: 1770 mg/dL — AB (ref 650–1600)
IgA: 138 mg/dL (ref 68–379)
IgM, Serum: 50 mg/dL (ref 41–251)
M-Spike, %: 0.99 g/dL
TOTAL PROTEIN, SERUM ELECTROPHOR: 7.4 g/dL (ref 6.0–8.3)

## 2014-02-18 LAB — KAPPA/LAMBDA LIGHT CHAINS
KAPPA LAMBDA RATIO: 5.88 — AB (ref 0.26–1.65)
Kappa free light chain: 8.94 mg/dL — ABNORMAL HIGH (ref 0.33–1.94)
LAMBDA FREE LGHT CHN: 1.52 mg/dL (ref 0.57–2.63)

## 2014-02-18 LAB — PHOSPHORUS: PHOSPHORUS: 4.7 mg/dL — AB (ref 2.3–4.6)

## 2014-02-18 LAB — BETA 2 MICROGLOBULIN, SERUM: BETA 2 MICROGLOBULIN: 4.33 mg/L — AB (ref <=2.51)

## 2014-02-21 ENCOUNTER — Ambulatory Visit (HOSPITAL_BASED_OUTPATIENT_CLINIC_OR_DEPARTMENT_OTHER): Payer: Medicare PPO | Admitting: Hematology and Oncology

## 2014-02-21 ENCOUNTER — Encounter: Payer: Self-pay | Admitting: Hematology and Oncology

## 2014-02-21 ENCOUNTER — Telehealth: Payer: Self-pay | Admitting: Hematology and Oncology

## 2014-02-21 ENCOUNTER — Telehealth: Payer: Self-pay | Admitting: *Deleted

## 2014-02-21 VITALS — BP 138/74 | HR 61 | Temp 97.9°F | Resp 18 | Ht 71.0 in | Wt 181.2 lb

## 2014-02-21 DIAGNOSIS — N189 Chronic kidney disease, unspecified: Secondary | ICD-10-CM

## 2014-02-21 DIAGNOSIS — N183 Chronic kidney disease, stage 3 unspecified: Secondary | ICD-10-CM

## 2014-02-21 DIAGNOSIS — D631 Anemia in chronic kidney disease: Secondary | ICD-10-CM

## 2014-02-21 DIAGNOSIS — C9000 Multiple myeloma not having achieved remission: Secondary | ICD-10-CM

## 2014-02-21 DIAGNOSIS — T451X5A Adverse effect of antineoplastic and immunosuppressive drugs, initial encounter: Secondary | ICD-10-CM

## 2014-02-21 DIAGNOSIS — C9002 Multiple myeloma in relapse: Secondary | ICD-10-CM

## 2014-02-21 DIAGNOSIS — G620 Drug-induced polyneuropathy: Secondary | ICD-10-CM | POA: Insufficient documentation

## 2014-02-21 MED ORDER — LENALIDOMIDE 5 MG PO CAPS: 5.0000 mg | ORAL_CAPSULE | Freq: Every day | ORAL | Status: DC

## 2014-02-21 NOTE — Telephone Encounter (Signed)
Per staff message and POF I have scheduled appts. Advised scheduler of appts. JMW  

## 2014-02-21 NOTE — Assessment & Plan Note (Signed)
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 The patient denies recent history of bleeding such as epistaxis, hematuria or hematochezia. He is asymptomatic from the anemia. We will observe for now.  He does not require transfusion now.

## 2014-02-21 NOTE — Assessment & Plan Note (Signed)
Unfortunately, he has disease recurrence based on rising M spike, IgG and kappa light chain levels. I recommend restaging with repeat bone marrow aspirate and biopsy and he agreed to have it performed unsedated on February 24th 2016. For second line treatment, I recommend placing a port with plan to start him on combination therapy with Elotuzumab and Revlimid. The reason of not pursuing further Velcade treatment is because of persistent and debilitating peripheral neuropathy. I will try to get insurance preauthorization and he will need prescription Revlimid and chemotherapy consent in the near future. The patient seems to have poor understanding of therapy plan and I recommend chemotherapy education class. I will see him back again prior to the start of treatment on 03/10/2014.

## 2014-02-21 NOTE — Assessment & Plan Note (Signed)
He has moderate peripheral neuropathy which is suspect is related to side effects of prior treatment. For this reason, I would not recommend Velcade.

## 2014-02-21 NOTE — Progress Notes (Signed)
[place] OFFICE PROGRESS NOTE  Patient Care Team: Lucianne Lei, MD as PCP - General (Family Medicine)  SUMMARY OF ONCOLOGIC HISTORY:  He was treated with Revlimid, Velcade and dexamethasone with good response to treatment. The patient developed severe illness requiring discontinuation of his chemotherapy in 2013. He was restarted back on Revlimid around October 2013. Unfortunately, starting around March of 2014 he had significant weight loss and diarrhea and his treatment was subsequently discontinued. He was being observed since then.  INTERVAL HISTORY: Please see below for problem oriented charting. He complained of peripheral neuropathy in his hands and feet. It is not severe or painful. Denies recent infection. His appetite stable, denies recent weight loss.  REVIEW OF SYSTEMS:   Constitutional: Denies fevers, chills or abnormal weight loss Eyes: Denies blurriness of vision Ears, nose, mouth, throat, and face: Denies mucositis or sore throat Respiratory: Denies cough, dyspnea or wheezes Cardiovascular: Denies palpitation, chest discomfort or lower extremity swelling Gastrointestinal:  Denies nausea, heartburn or change in bowel habits Skin: Denies abnormal skin rashes Lymphatics: Denies new lymphadenopathy or easy bruising Behavioral/Psych: Mood is stable, no new changes  All other systems were reviewed with the patient and are negative.  I have reviewed the past medical history, past surgical history, social history and family history with the patient and they are unchanged from previous note.  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  MEDICATIONS:  Current Outpatient Prescriptions  Medication Sig Dispense Refill  . allopurinol (ZYLOPRIM) 100 MG tablet Take by mouth 2 (two) times daily.     Marland Kitchen amLODipine (NORVASC) 10 MG tablet TAKE 1 TABLET BY MOUTH EVERY DAY 30 tablet 1  . atorvastatin (LIPITOR) 20 MG tablet Take 20 mg by mouth at bedtime.     .  dorzolamide-timolol (COSOPT) 22.3-6.8 MG/ML ophthalmic solution     . folic acid (FOLVITE) 1 MG tablet Take 1 mg by mouth daily.      . hydrochlorothiazide (HYDRODIURIL) 25 MG tablet TAKE 1 TABLET BY MOUTH EVERY DAY 90 tablet 4  . KLOR-CON M20 20 MEQ tablet TAKE 1 TABLET BY MOUTH EVERY DAY 90 tablet 1  . loperamide (IMODIUM A-D) 2 MG tablet 1 po after each watery BM.  Maximum 6 per 24hrs. 30 tablet PRN  . ondansetron (ZOFRAN-ODT) 8 MG disintegrating tablet LET 1 TABLET DISSOLVE IN MOUTH AS DIRECTED EVERY 8 HOURS AS NEEDED FOR NAUSEA 20 tablet 2  . traMADol (ULTRAM) 50 MG tablet Take 1 tablet (50 mg total) by mouth every 6 (six) hours as needed. 20 tablet 0  . vitamin E (VITAMIN E) 400 UNIT capsule Take 400 Units by mouth daily.       No current facility-administered medications for this visit.    PHYSICAL EXAMINATION: ECOG PERFORMANCE STATUS: 1 - Symptomatic but completely ambulatory  Filed Vitals:   02/21/14 0955  BP: 138/74  Pulse: 61  Temp: 97.9 F (36.6 C)  Resp: 18   Filed Weights   02/21/14 0955  Weight: 181 lb 3.2 oz (82.192 kg)    GENERAL:alert, no distress and comfortable SKIN: skin color, texture, turgor are normal, no rashes or significant lesions EYES: normal, Conjunctiva are pink and non-injected, sclera clear OROPHARYNX:no exudate, no erythema and lips, buccal mucosa, and tongue normal  NECK: supple, thyroid normal size, non-tender, without nodularity LYMPH:  no palpable lymphadenopathy in the cervical, axillary or inguinal LUNGS: clear to auscultation and percussion with normal breathing effort HEART: regular rate & rhythm and no murmurs and no  lower extremity edema ABDOMEN:abdomen soft, non-tender and normal bowel sounds Musculoskeletal:no cyanosis of digits and no clubbing  NEURO: alert & oriented x 3 with fluent speech, no focal motor/sensory deficits  LABORATORY DATA:  I have reviewed the data as listed    Component Value Date/Time   NA 136 02/16/2014  0934   NA 132* 04/02/2012 1705   K 4.7 02/16/2014 0934   K 4.0 04/02/2012 1705   CL 107 06/29/2012 1131   CL 92* 04/02/2012 1705   CO2 23 02/16/2014 0934   CO2 29 04/02/2012 1705   GLUCOSE 89 02/16/2014 0934   GLUCOSE 143* 06/29/2012 1131   GLUCOSE 105* 04/02/2012 1705   BUN 26.1* 02/16/2014 0934   BUN 21 04/02/2012 1705   CREATININE 1.7* 02/16/2014 0934   CREATININE 1.89* 04/02/2012 1705   CREATININE 2.03* 12/14/2010 1114   CALCIUM 9.6 02/16/2014 0934   CALCIUM 8.0* 04/02/2012 1705   PROT 7.6 02/16/2014 0934   PROT 4.9* 08/05/2011 1100   ALBUMIN 3.7 02/16/2014 0934   ALBUMIN 2.4* 08/05/2011 1100   AST 20 02/16/2014 0934   AST 31 08/05/2011 1100   ALT 16 02/16/2014 0934   ALT 45 08/05/2011 1100   ALKPHOS 112 02/16/2014 0934   ALKPHOS 182* 08/05/2011 1100   BILITOT 0.46 02/16/2014 0934   BILITOT 0.5 08/05/2011 1100   GFRNONAA 84* 07/28/2011 0341   GFRAA >90 07/28/2011 0341    No results found for: SPEP, UPEP  Lab Results  Component Value Date   WBC 10.4* 02/16/2014   NEUTROABS 6.2 02/16/2014   HGB 10.7* 02/16/2014   HCT 33.0* 02/16/2014   MCV 92.5 02/16/2014   PLT 347 02/16/2014      Chemistry      Component Value Date/Time   NA 136 02/16/2014 0934   NA 132* 04/02/2012 1705   K 4.7 02/16/2014 0934   K 4.0 04/02/2012 1705   CL 107 06/29/2012 1131   CL 92* 04/02/2012 1705   CO2 23 02/16/2014 0934   CO2 29 04/02/2012 1705   BUN 26.1* 02/16/2014 0934   BUN 21 04/02/2012 1705   CREATININE 1.7* 02/16/2014 0934   CREATININE 1.89* 04/02/2012 1705   CREATININE 2.03* 12/14/2010 1114      Component Value Date/Time   CALCIUM 9.6 02/16/2014 0934   CALCIUM 8.0* 04/02/2012 1705   ALKPHOS 112 02/16/2014 0934   ALKPHOS 182* 08/05/2011 1100   AST 20 02/16/2014 0934   AST 31 08/05/2011 1100   ALT 16 02/16/2014 0934   ALT 45 08/05/2011 1100   BILITOT 0.46 02/16/2014 0934   BILITOT 0.5 08/05/2011 1100     ASSESSMENT & PLAN:  Multiple myeloma Unfortunately, he  has disease recurrence based on rising M spike, IgG and kappa light chain levels. I recommend restaging with repeat bone marrow aspirate and biopsy and he agreed to have it performed unsedated on February 24th 2016. For second line treatment, I recommend placing a port with plan to start him on combination therapy with Elotuzumab and Revlimid. The reason of not pursuing further Velcade treatment is because of persistent and debilitating peripheral neuropathy. I will try to get insurance preauthorization and he will need prescription Revlimid and chemotherapy consent in the near future. The patient seems to have poor understanding of therapy plan and I recommend chemotherapy education class. I will see him back again prior to the start of treatment on 03/10/2014.   Anemia in chronic renal disease This is likely anemia of chronic disease from renal failure  and relapsed myeloma. The patient denies recent history of bleeding such as epistaxis, hematuria or hematochezia. He is asymptomatic from the anemia. We will observe for now.  He does not require transfusion now.     Chronic renal insufficiency, stage III (moderate) This is chronic in nature. It is stable. Recommend observation only.   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 For this reason, I would not recommend Velcade.    Orders Placed This Encounter  Procedures  . IR Fluoro Guide CV Line Right    Indicate type of CVC ordering    Standing Status: Future     Number of Occurrences:      Standing Expiration Date: 04/22/2015    Order Specific Question:  Reason for exam:    Answer:  port placement for chemo    Order Specific Question:  Preferred Imaging Location?    Answer:  [facility]  . CBC with Differential/Platelet    Standing Status: Future     Number of Occurrences:      Standing Expiration Date: 03/28/2015  .  Comprehensive metabolic panel    Standing Status: Future     Number of Occurrences:      Standing Expiration Date: 03/28/2015   All questions were answered. The patient knows to call the clinic with any problems, questions or concerns. No barriers to learning was detected. I spent 40 minutes counseling the patient face to face. The total time spent in the appointment was 60 minutes and more than 50% was on counseling and review of test results     [facility], [place], MD 02/21/2014 11:15 AM

## 2014-02-21 NOTE — Telephone Encounter (Signed)
Pt confirmed labs/ov per 02/15 POF, gave pt AVS... KJ, sent msg to add BM Biopsy and chemo treatments.Marland KitchenMarland KitchenMarland Kitchen

## 2014-02-21 NOTE — Assessment & Plan Note (Signed)
This is chronic in nature. It is stable. Recommend observation only.

## 2014-02-22 ENCOUNTER — Encounter: Payer: Self-pay | Admitting: *Deleted

## 2014-02-22 NOTE — Progress Notes (Signed)
Notified Butch Penny in [employer] of [employer] scheduled at [facility] 2/24 at 8 am.

## 2014-02-23 ENCOUNTER — Other Ambulatory Visit: Payer: 59

## 2014-02-23 ENCOUNTER — Encounter: Payer: Self-pay | Admitting: *Deleted

## 2014-02-23 ENCOUNTER — Other Ambulatory Visit: Payer: Self-pay | Admitting: *Deleted

## 2014-02-23 NOTE — Progress Notes (Signed)
Patient here for chemo class today.  Went over with patient the 6 pages of the revlimid consent.  Patient has some difficulty reading.

## 2014-02-24 ENCOUNTER — Other Ambulatory Visit: Payer: Self-pay | Admitting: *Deleted

## 2014-02-24 MED ORDER — LENALIDOMIDE 5 MG PO CAPS: 5.0000 mg | ORAL_CAPSULE | Freq: Every day | ORAL | Status: DC

## 2014-02-25 ENCOUNTER — Encounter: Payer: Self-pay | Admitting: *Deleted

## 2014-02-25 NOTE — Progress Notes (Signed)
Received fax from [facility].  They will ship Revlimid to pt for delivery on 02/25/14.

## 2014-03-02 ENCOUNTER — Encounter: Payer: Self-pay | Admitting: Hematology and Oncology

## 2014-03-02 ENCOUNTER — Other Ambulatory Visit: Payer: Self-pay | Admitting: Radiology

## 2014-03-02 ENCOUNTER — Ambulatory Visit (HOSPITAL_BASED_OUTPATIENT_CLINIC_OR_DEPARTMENT_OTHER): Payer: Medicare PPO | Admitting: Hematology and Oncology

## 2014-03-02 ENCOUNTER — Other Ambulatory Visit (HOSPITAL_COMMUNITY)
Admission: RE | Admit: 2014-03-02 | Discharge: 2014-03-02 | Disposition: A | Discharge status: Home / Self Care | Payer: 59 | Source: Ambulatory Visit | Attending: Hematology and Oncology | Admitting: Hematology and Oncology

## 2014-03-02 ENCOUNTER — Other Ambulatory Visit (HOSPITAL_BASED_OUTPATIENT_CLINIC_OR_DEPARTMENT_OTHER): Payer: 59

## 2014-03-02 VITALS — BP 119/59 | HR 53 | Temp 98.7°F | Resp 18

## 2014-03-02 DIAGNOSIS — C9000 Multiple myeloma not having achieved remission: Secondary | ICD-10-CM | POA: Insufficient documentation

## 2014-03-02 DIAGNOSIS — N189 Chronic kidney disease, unspecified: Secondary | ICD-10-CM

## 2014-03-02 DIAGNOSIS — D631 Anemia in chronic kidney disease: Secondary | ICD-10-CM

## 2014-03-02 LAB — CBC & DIFF AND RETIC
BASO%: 0.9 % (ref 0.0–2.0)
BASOS ABS: 0.1 10*3/uL (ref 0.0–0.1)
EOS ABS: 0.3 10*3/uL (ref 0.0–0.5)
EOS%: 4.3 % (ref 0.0–7.0)
HEMATOCRIT: 32.8 % — AB (ref 38.4–49.9)
HGB: 10.9 g/dL — ABNORMAL LOW (ref 13.0–17.1)
Immature Retic Fract: 15.2 % — ABNORMAL HIGH (ref 3.00–10.60)
LYMPH%: 39.6 % (ref 14.0–49.0)
MCH: 29.9 pg (ref 27.2–33.4)
MCHC: 33.2 g/dL (ref 32.0–36.0)
MCV: 90.1 fL (ref 79.3–98.0)
MONO#: 0.5 10*3/uL (ref 0.1–0.9)
MONO%: 6.4 % (ref 0.0–14.0)
NEUT%: 48.8 % (ref 39.0–75.0)
NEUTROS ABS: 3.9 10*3/uL (ref 1.5–6.5)
PLATELETS: 345 10*3/uL (ref 140–400)
RBC: 3.64 10*6/uL — AB (ref 4.20–5.82)
RDW: 14.5 % (ref 11.0–14.6)
RETIC %: 1.88 % — AB (ref 0.80–1.80)
Retic Ct Abs: 68.43 10*3/uL (ref 34.80–93.90)
WBC: 8 10*3/uL (ref 4.0–10.3)
lymph#: 3.2 10*3/uL (ref 0.9–3.3)

## 2014-03-02 LAB — COMPREHENSIVE METABOLIC PANEL (CC13)
ALT: 10 U/L (ref 0–55)
ANION GAP: 9 meq/L (ref 3–11)
AST: 20 U/L (ref 5–34)
Albumin: 3.7 g/dL (ref 3.5–5.0)
Alkaline Phosphatase: 108 U/L (ref 40–150)
BILIRUBIN TOTAL: 0.42 mg/dL (ref 0.20–1.20)
BUN: 18.8 mg/dL (ref 7.0–26.0)
CO2: 23 mEq/L (ref 22–29)
Calcium: 9.5 mg/dL (ref 8.4–10.4)
Chloride: 108 mEq/L (ref 98–109)
Creatinine: 1.7 mg/dL — ABNORMAL HIGH (ref 0.7–1.3)
EGFR: 45 mL/min/{1.73_m2} — AB (ref >90)
Glucose: 84 mg/dl (ref 70–140)
POTASSIUM: 4.3 meq/L (ref 3.5–5.1)
SODIUM: 139 meq/L (ref 136–145)
Total Protein: 7.8 g/dL (ref 6.4–8.3)

## 2014-03-02 LAB — IRON AND TIBC CHCC
%SAT: 13 % — ABNORMAL LOW (ref 20–55)
Iron: 42 ug/dL (ref 42–163)
TIBC: 319 ug/dL (ref 202–409)
UIBC: 277 ug/dL (ref 117–376)

## 2014-03-02 LAB — URIC ACID (CC13): URIC ACID, SERUM: 4.5 mg/dL (ref 2.6–7.4)

## 2014-03-02 LAB — VITAMIN B12: VITAMIN B 12: 519 pg/mL (ref 211–911)

## 2014-03-02 LAB — SEDIMENTATION RATE: Sed Rate: 16 mm/hr (ref 0–20)

## 2014-03-02 LAB — FERRITIN CHCC: Ferritin: 59 ng/ml (ref 22–316)

## 2014-03-02 LAB — BONE MARROW EXAM

## 2014-03-02 NOTE — Patient Instructions (Signed)
Bone Marrow Aspiration and Bone Biopsy Examination of the bone marrow is a valuable test to diagnose blood disorders. A bone marrow biopsy takes a sample of bone and a small amount of fluid and cells from inside the bone. A bone marrow aspiration removes only the marrow. Bone marrow aspiration and bone biopsies are used to stage different disorders of the blood, such as leukemia. Staging will help your caregiver understand how far the disease has progressed.  The tests are also useful in diagnosing:  Fever of unknown origin (FUO).  Bacterial infections and other widespread fungal infections.  Cancers that have spread (metastasized) to the bone marrow.  Diseases that are characterized by a deficiency of an enzyme (storage diseases). This includes:  Niemann-Pick disease.  Gaucher disease. PROCEDURE  Sites used to get samples include:   Back of your hip bone (posterior iliac crest).  Both aspiration and biopsy.  Front of your hip bone (anterior iliac crest).  Both aspiration and biopsy.  Breastbone (sternum).  Aspiration from your breastbone (done only in adults). This method is rarely used. When you get a hip bone aspiration:  You are placed lying on your side with the upper knee brought up and flexed with the lower leg straight.  The site is prepared, cleaned with an antiseptic scrub, and draped. This keeps the biopsy area clean.  The skin and the area down to the lining of the bone (periosteum) are made numb with a local anesthetic.  The bone marrow aspiration needle is inserted. You will feel pressure on your bone.  Once inside the marrow cavity, a sample of bone marrow is sucked out (aspirated) for pathology slides.  The material collected for bone marrow slides is processed immediately by a technologist.  The technician selects the marrow particles to make the slides for pathology.  The marrow aspiration needle is removed. Then pressure is applied to the site with  gauze until bleeding has stopped. Following an aspiration, a bone marrow biopsy may be performed as well. The technique for this is very similar. A dressing is then applied.  RISKS AND COMPLICATIONS  The main complications of a bone marrow aspiration and biopsy include infection and bleeding.  Complications are uncommon. The procedure may not be performed in patients with bleeding tendencies.  A very rare complication from the procedure is injury to the heart during a breastbone (sternal) marrow aspiration. Only bone marrow aspirations are performed in this area.  Long-lasting pain at the site of the bone marrow aspiration and biopsy is uncommon. Your caregiver will let you know when you are to get your results and will discuss them with you. You may make an appointment with your caregiver to find out the results. Do not assume everything is normal if you have not heard from your caregiver or the medical facility. It is important for you to follow up on all of your test results. Document Released: 12/28/2003 Document Revised: 03/18/2011 Document Reviewed: 12/22/2007 [facility] Patient Information 2015 [place], [place]. This information is not intended to replace advice given to you by your health care provider. Make sure you discuss any questions you have with your health care provider.

## 2014-03-02 NOTE — Progress Notes (Signed)
[place] OFFICE PROGRESS NOTE  Patient Care Team: Lucianne Lei, MD as PCP - General (Family Medicine)  SUMMARY OF ONCOLOGIC HISTORY:  He was treated with Revlimid, Velcade and dexamethasone with good response to treatment. The patient developed severe illness requiring discontinuation of his chemotherapy in 2013. He was restarted back on Revlimid around October 2013. Unfortunately, starting around March of 2014 he had significant weight loss and diarrhea and his treatment was subsequently discontinued. He was being observed since then.  I have reviewed the past medical history, past surgical history, social history and family history with the patient and they are unchanged from previous note.  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  MEDICATIONS:  Current Outpatient Prescriptions  Medication Sig Dispense Refill  . allopurinol (ZYLOPRIM) 100 MG tablet Take by mouth 2 (two) times daily.     Marland Kitchen amLODipine (NORVASC) 10 MG tablet TAKE 1 TABLET BY MOUTH EVERY DAY 30 tablet 1  . atorvastatin (LIPITOR) 20 MG tablet Take 20 mg by mouth at bedtime.     . dorzolamide-timolol (COSOPT) 22.3-6.8 MG/ML ophthalmic solution     . folic acid (FOLVITE) 1 MG tablet Take 1 mg by mouth daily.      . hydrochlorothiazide (HYDRODIURIL) 25 MG tablet TAKE 1 TABLET BY MOUTH EVERY DAY 90 tablet 4  . KLOR-CON M20 20 MEQ tablet TAKE 1 TABLET BY MOUTH EVERY DAY 90 tablet 1  . lenalidomide (REVLIMID) 5 MG capsule Take 1 capsule (5 mg total) by mouth daily. Take 1 capsule daily for 21 days and then rest 7 days 21 capsule 0  . loperamide (IMODIUM A-D) 2 MG tablet 1 po after each watery BM.  Maximum 6 per 24hrs. 30 tablet PRN  . ondansetron (ZOFRAN-ODT) 8 MG disintegrating tablet LET 1 TABLET DISSOLVE IN MOUTH AS DIRECTED EVERY 8 HOURS AS NEEDED FOR NAUSEA 20 tablet 2  . traMADol (ULTRAM) 50 MG tablet Take 1 tablet (50 mg total) by mouth every 6 (six) hours as needed. 20 tablet 0  . vitamin E  (VITAMIN E) 400 UNIT capsule Take 400 Units by mouth daily.       No current facility-administered medications for this visit.    PHYSICAL EXAMINATION: ECOG PERFORMANCE STATUS: 1 - Symptomatic but completely ambulatory  Filed Vitals:   03/02/14 0838  BP: 119/59  Pulse: 53  Temp: 98.7 F (37.1 C)  Resp: 18  LABORATORY DATA:  I have reviewed the data as listed    Component Value Date/Time   NA 139 03/02/2014 0815   NA 132* 04/02/2012 1705   K 4.3 03/02/2014 0815   K 4.0 04/02/2012 1705   CL 107 06/29/2012 1131   CL 92* 04/02/2012 1705   CO2 23 03/02/2014 0815   CO2 29 04/02/2012 1705   GLUCOSE 84 03/02/2014 0815   GLUCOSE 143* 06/29/2012 1131   GLUCOSE 105* 04/02/2012 1705   BUN 18.8 03/02/2014 0815   BUN 21 04/02/2012 1705   CREATININE 1.7* 03/02/2014 0815   CREATININE 1.89* 04/02/2012 1705   CREATININE 2.03* 12/14/2010 1114   CALCIUM 9.5 03/02/2014 0815   CALCIUM 8.0* 04/02/2012 1705   PROT 7.8 03/02/2014 0815   PROT 4.9* 08/05/2011 1100   ALBUMIN 3.7 03/02/2014 0815   ALBUMIN 2.4* 08/05/2011 1100   AST 20 03/02/2014 0815   AST 31 08/05/2011 1100   ALT 10 03/02/2014 0815   ALT 45 08/05/2011 1100   ALKPHOS 108 03/02/2014 0815   ALKPHOS 182* 08/05/2011 1100  BILITOT 0.42 03/02/2014 0815   BILITOT 0.5 08/05/2011 1100   GFRNONAA 84* 07/28/2011 0341   GFRAA >90 07/28/2011 0341    No results found for: SPEP, UPEP  Lab Results  Component Value Date   WBC 8.0 03/02/2014   NEUTROABS 3.9 03/02/2014   HGB 10.9* 03/02/2014   HCT 32.8* 03/02/2014   MCV 90.1 03/02/2014   PLT 345 03/02/2014      Chemistry      Component Value Date/Time   NA 139 03/02/2014 0815   NA 132* 04/02/2012 1705   K 4.3 03/02/2014 0815   K 4.0 04/02/2012 1705   CL 107 06/29/2012 1131   CL 92* 04/02/2012 1705   CO2 23 03/02/2014 0815   CO2 29 04/02/2012 1705   BUN 18.8 03/02/2014 0815   BUN 21 04/02/2012 1705   CREATININE 1.7* 03/02/2014 0815   CREATININE 1.89* 04/02/2012 1705    CREATININE 2.03* 12/14/2010 1114      Component Value Date/Time   CALCIUM 9.5 03/02/2014 0815   CALCIUM 8.0* 04/02/2012 1705   ALKPHOS 108 03/02/2014 0815   ALKPHOS 182* 08/05/2011 1100   AST 20 03/02/2014 0815   AST 31 08/05/2011 1100   ALT 10 03/02/2014 0815   ALT 45 08/05/2011 1100   BILITOT 0.42 03/02/2014 0815   BILITOT 0.5 08/05/2011 1100     ASSESSMENT & PLAN:  Multiple myeloma Right now, he is not symptomatic. We discussed the importance of staging bone marrow aspirate and biopsy. The patient would like to proceed.  Brief examination was performed. ENT: adequate airway clearance Heart: regular rate and rhythm.No Murmurs Lungs: clear to auscultation, no wheezes, normal respiratory effort Bone Marrow Biopsy and Aspiration Procedure Note   Informed consent was obtained and potential risks including bleeding, infection and pain were reviewed with the patient.   The patient's name, date of birth, identification, consent and allergies were verified prior to the start of procedure and time out was performed. The right posterior iliac crest was chosen as the site of biopsy.  The skin was prepped with Betadine solution.   8 cc of 1% lidocaine was used to provide local anaesthesia.   10 cc of bone marrow aspirate was obtained followed by 1 inch biopsy. 5 attempts were made as to bone fragmented with each attempt. After the fifth attemtp, we obtained enough sample for biopsy.  The procedure was tolerated well and there were no complications.  The patient was stable at the end of the procedure.  Specimens sent for flow cytometry, cytogenetics and additional studies. Spent 30 minutes with the patient    No orders of the defined types were placed in this encounter.   All questions were answered. The patient knows to call the clinic with any problems, questions or concerns. No barriers to learning was detected. I spent 25 minutes counseling the patient face to face. The total  time spent in the appointment was 30 minutes and more than 50% was on counseling and review of test results     [facility], [place], MD 03/02/2014 4:09 PM

## 2014-03-02 NOTE — Assessment & Plan Note (Signed)
Right now, he is not symptomatic. We discussed the importance of staging bone marrow aspirate and biopsy. The patient would like to proceed.  Brief examination was performed. ENT: adequate airway clearance Heart: regular rate and rhythm.No Murmurs Lungs: clear to auscultation, no wheezes, normal respiratory effort Bone Marrow Biopsy and Aspiration Procedure Note   Informed consent was obtained and potential risks including bleeding, infection and pain were reviewed with the patient.   The patient's name, date of birth, identification, consent and allergies were verified prior to the start of procedure and time out was performed. The right posterior iliac crest was chosen as the site of biopsy.  The skin was prepped with Betadine solution.   8 cc of 1% lidocaine was used to provide local anaesthesia.   10 cc of bone marrow aspirate was obtained followed by 1 inch biopsy. 5 attempts were made as to bone fragmented with each attempt. After the fifth attemtp, we obtained enough sample for biopsy.  The procedure was tolerated well and there were no complications.  The patient was stable at the end of the procedure.  Specimens sent for flow cytometry, cytogenetics and additional studies. Spent 30 minutes with the patient

## 2014-03-03 ENCOUNTER — Encounter (HOSPITAL_COMMUNITY): Payer: Self-pay

## 2014-03-03 ENCOUNTER — Ambulatory Visit (HOSPITAL_COMMUNITY)
Admission: RE | Admit: 2014-03-03 | Discharge: 2014-03-03 | Disposition: A | Discharge status: Home / Self Care | Payer: Medicare PPO | Source: Ambulatory Visit | Attending: Hematology and Oncology | Admitting: Hematology and Oncology

## 2014-03-03 ENCOUNTER — Telehealth: Payer: Self-pay | Admitting: *Deleted

## 2014-03-03 ENCOUNTER — Other Ambulatory Visit: Payer: Self-pay | Admitting: Hematology and Oncology

## 2014-03-03 ENCOUNTER — Ambulatory Visit (HOSPITAL_COMMUNITY)
Admission: RE | Admit: 2014-03-03 | Discharge: 2014-03-03 | Disposition: A | Discharge status: Home / Self Care | Payer: Medicare PPO | Source: Ambulatory Visit | Attending: Interventional Radiology | Admitting: Interventional Radiology

## 2014-03-03 DIAGNOSIS — I129 Hypertensive chronic kidney disease with stage 1 through stage 4 chronic kidney disease, or unspecified chronic kidney disease: Secondary | ICD-10-CM | POA: Diagnosis not present

## 2014-03-03 DIAGNOSIS — Z79899 Other long term (current) drug therapy: Secondary | ICD-10-CM | POA: Insufficient documentation

## 2014-03-03 DIAGNOSIS — Z79891 Long term (current) use of opiate analgesic: Secondary | ICD-10-CM | POA: Insufficient documentation

## 2014-03-03 DIAGNOSIS — N183 Chronic kidney disease, stage 3 (moderate): Secondary | ICD-10-CM | POA: Insufficient documentation

## 2014-03-03 DIAGNOSIS — D649 Anemia, unspecified: Secondary | ICD-10-CM | POA: Diagnosis not present

## 2014-03-03 DIAGNOSIS — C9000 Multiple myeloma not having achieved remission: Secondary | ICD-10-CM

## 2014-03-03 DIAGNOSIS — E785 Hyperlipidemia, unspecified: Secondary | ICD-10-CM | POA: Insufficient documentation

## 2014-03-03 LAB — CBC WITH DIFFERENTIAL/PLATELET
BASOS PCT: 1 % (ref 0–1)
Basophils Absolute: 0.1 10*3/uL (ref 0.0–0.1)
EOS PCT: 4 % (ref 0–5)
Eosinophils Absolute: 0.3 10*3/uL (ref 0.0–0.7)
HEMATOCRIT: 32.7 % — AB (ref 39.0–52.0)
HEMOGLOBIN: 10.7 g/dL — AB (ref 13.0–17.0)
LYMPHS PCT: 37 % (ref 12–46)
Lymphs Abs: 3.2 10*3/uL (ref 0.7–4.0)
MCH: 29.4 pg (ref 26.0–34.0)
MCHC: 32.7 g/dL (ref 30.0–36.0)
MCV: 89.8 fL (ref 78.0–100.0)
Monocytes Absolute: 0.7 10*3/uL (ref 0.1–1.0)
Monocytes Relative: 8 % (ref 3–12)
Neutro Abs: 4.3 10*3/uL (ref 1.7–7.7)
Neutrophils Relative %: 50 % (ref 43–77)
Platelets: 387 10*3/uL (ref 150–400)
RBC: 3.64 MIL/uL — ABNORMAL LOW (ref 4.22–5.81)
RDW: 14.1 % (ref 11.5–15.5)
WBC: 8.6 10*3/uL (ref 4.0–10.5)

## 2014-03-03 LAB — PROTIME-INR
INR: 1 (ref 0.00–1.49)
Prothrombin Time: 13.3 seconds (ref 11.6–15.2)

## 2014-03-03 MED ORDER — FENTANYL CITRATE 0.05 MG/ML IJ SOLN
INTRAMUSCULAR | Status: AC
  Filled 2014-03-03: qty 4

## 2014-03-03 MED ORDER — MIDAZOLAM HCL 2 MG/2ML IJ SOLN
INTRAMUSCULAR | Status: AC | PRN
  Administered 2014-03-03: 0.5 mg via INTRAVENOUS
  Administered 2014-03-03: 1 mg via INTRAVENOUS

## 2014-03-03 MED ORDER — MIDAZOLAM HCL 2 MG/2ML IJ SOLN
INTRAMUSCULAR | Status: AC
  Filled 2014-03-03: qty 6

## 2014-03-03 MED ORDER — CEFAZOLIN SODIUM-DEXTROSE 2-3 GM-% IV SOLR
2.0000 g | Freq: Once | INTRAVENOUS | Status: AC
  Administered 2014-03-03: 2 g via INTRAVENOUS

## 2014-03-03 MED ORDER — SODIUM CHLORIDE 0.9 % IV SOLN
INTRAVENOUS | Status: DC
  Administered 2014-03-03: 12:00:00 via INTRAVENOUS

## 2014-03-03 MED ORDER — HEPARIN SOD (PORK) LOCK FLUSH 100 UNIT/ML IV SOLN
INTRAVENOUS | Status: AC | PRN
  Administered 2014-03-03: 500 [IU]

## 2014-03-03 MED ORDER — LIDOCAINE HCL 1 % IJ SOLN
INTRAMUSCULAR | Status: AC
  Filled 2014-03-03: qty 20

## 2014-03-03 MED ORDER — LIDOCAINE-PRILOCAINE 2.5-2.5 % EX CREA: 1.0000 "application " | TOPICAL_CREAM | CUTANEOUS | Status: DC | PRN

## 2014-03-03 MED ORDER — HEPARIN SOD (PORK) LOCK FLUSH 100 UNIT/ML IV SOLN
INTRAVENOUS | Status: AC
  Filled 2014-03-03: qty 5

## 2014-03-03 MED ORDER — CEFAZOLIN SODIUM-DEXTROSE 2-3 GM-% IV SOLR
INTRAVENOUS | Status: AC
  Filled 2014-03-03: qty 50

## 2014-03-03 MED ORDER — FENTANYL CITRATE 0.05 MG/ML IJ SOLN
INTRAMUSCULAR | Status: AC | PRN
  Administered 2014-03-03: 50 ug via INTRAVENOUS
  Administered 2014-03-03: 25 ug via INTRAVENOUS

## 2014-03-03 MED ORDER — LIDOCAINE-EPINEPHRINE 2 %-1:100000 IJ SOLN
INTRAMUSCULAR | Status: AC
  Filled 2014-03-03: qty 1

## 2014-03-03 NOTE — Discharge Instructions (Signed)
Leave dressing on for 24 hours.  You may shower after 24 hours.  Please remove the dressing before you shower.   ° ° °Implanted Port Insertion, Care After °Refer to this sheet in the next few weeks. These instructions provide you with information on caring for yourself after your procedure. Your health care provider may also give you more specific instructions. Your treatment has been planned according to current medical practices, but problems sometimes occur. Call your health care provider if you have any problems or questions after your procedure. °WHAT TO EXPECT AFTER THE PROCEDURE °After your procedure, it is typical to have the following:  °· Discomfort at the port insertion site. Ice packs to the area will help. °· Bruising on the skin over the port. This will subside in 3-4 days. °HOME CARE INSTRUCTIONS °· After your port is placed, you will get a manufacturer's information card. The card has information about your port. Keep this card with you at all times.   °· Know what kind of port you have. There are many types of ports available.   °· Wear a medical alert bracelet in case of an emergency. This can help alert health care workers that you have a port.   °· The port can stay in for as long as your health care provider believes it is necessary.   °· A home health care nurse may give medicines and take care of the port.   °· You or a family member can get special training and directions for giving medicine and taking care of the port at home.   °SEEK MEDICAL CARE IF:  °· Your port does not flush or you are unable to get a blood return.   °· You have a fever or chills. °SEEK IMMEDIATE MEDICAL CARE IF: °· You have new fluid or pus coming from your incision.   °· You notice a bad smell coming from your incision site.   °· You have swelling, pain, or more redness at the incision or port site.   °· You have chest pain or shortness of breath. °Document Released: 10/14/2012 Document Revised: 12/29/2012 Document  Reviewed: 10/14/2012 °ExitCare® Patient Information ©2015 [employer]. This information is not intended to replace advice given to you by your health care provider. Make sure you discuss any questions you have with your health care provider. °Implanted Port Home Guide °An implanted port is a type of central line that is placed under the skin. Central lines are used to provide IV access when treatment or nutrition needs to be given through a person's veins. Implanted ports are used for long-term IV access. An implanted port may be placed because:  °· You need IV medicine that would be irritating to the small veins in your hands or arms.   °· You need long-term IV medicines, such as antibiotics.   °· You need IV nutrition for a long period.   °· You need frequent blood draws for lab tests.   °· You need dialysis.   °Implanted ports are usually placed in the chest area, but they can also be placed in the upper arm, the abdomen, or the leg. An implanted port has two main parts:  °· Reservoir. The reservoir is round and will appear as a small, raised area under your skin. The reservoir is the part where a needle is inserted to give medicines or draw blood.   °· Catheter. The catheter is a thin, flexible tube that extends from the reservoir. The catheter is placed into a large vein. Medicine that is inserted into the reservoir goes into   the catheter and then into the vein.   °HOW WILL I CARE FOR MY INCISION SITE? °Do not get the incision site wet. Bathe or shower as directed by your health care provider.  °HOW IS MY PORT ACCESSED? °Special steps must be taken to access the port:  °· Before the port is accessed, a numbing cream can be placed on the skin. This helps numb the skin over the port site.   °· Your health care provider uses a sterile technique to access the port. °· Your health care provider must put on a mask and sterile gloves. °· The skin over your port is cleaned carefully with an antiseptic and allowed to  dry. °· The port is gently pinched between sterile gloves, and a needle is inserted into the port. °· Only "non-coring" port needles should be used to access the port. Once the port is accessed, a blood return should be checked. This helps ensure that the port is in the vein and is not clogged.   °· If your port needs to remain accessed for a constant infusion, a clear (transparent) bandage will be placed over the needle site. The bandage and needle will need to be changed every week, or as directed by your health care provider.   °· Keep the bandage covering the needle clean and dry. Do not get it wet. Follow your health care provider's instructions on how to take a shower or bath while the port is accessed.   °· If your port does not need to stay accessed, no bandage is needed over the port.   °WHAT IS FLUSHING? °Flushing helps keep the port from getting clogged. Follow your health care provider's instructions on how and when to flush the port. Ports are usually flushed with saline solution or a medicine called heparin. The need for flushing will depend on how the port is used.  °· If the port is used for intermittent medicines or blood draws, the port will need to be flushed:   °· After medicines have been given.   °· After blood has been drawn.   °· As part of routine maintenance.   °· If a constant infusion is running, the port may not need to be flushed.   °HOW LONG WILL MY PORT STAY IMPLANTED? °The port can stay in for as long as your health care provider thinks it is needed. When it is time for the port to come out, surgery will be done to remove it. The procedure is similar to the one performed when the port was put in.  °WHEN SHOULD I SEEK IMMEDIATE MEDICAL CARE? °When you have an implanted port, you should seek immediate medical care if:  °· You notice a bad smell coming from the incision site.   °· You have swelling, redness, or drainage at the incision site.   °· You have more swelling or pain at the  port site or the surrounding area.   °· You have a fever that is not controlled with medicine. °Document Released: 12/24/2004 Document Revised: 10/14/2012 Document Reviewed: 08/31/2012 °ExitCare® Patient Information ©2015 [employer]. This information is not intended to replace advice given to you by your health care provider. Make sure you discuss any questions you have with your health care provider. °Conscious Sedation, Adult, Care After °Refer to this sheet in the next few weeks. These instructions provide you with information on caring for yourself after your procedure. Your health care provider may also give you more specific instructions. Your treatment has been planned according to current medical practices, but problems sometimes occur.   Call your health care provider if you have any problems or questions after your procedure. °WHAT TO EXPECT AFTER THE PROCEDURE  °After your procedure: °· You may feel sleepy, clumsy, and have poor balance for several hours. °· Vomiting may occur if you eat too soon after the procedure. °HOME CARE INSTRUCTIONS °· Do not participate in any activities where you could become injured for at least 24 hours. Do not: °¨ Drive. °¨ Swim. °¨ Ride a bicycle. °¨ Operate heavy machinery. °¨ Cook. °¨ Use power tools. °¨ Climb ladders. °¨ Work from a high place. °· Do not make important decisions or sign legal documents until you are improved. °· If you vomit, drink water, juice, or soup when you can drink without vomiting. Make sure you have little or no nausea before eating solid foods. °· Only take over-the-counter or prescription medicines for pain, discomfort, or fever as directed by your health care provider. °· Make sure you and your family fully understand everything about the medicines given to you, including what side effects may occur. °· You should not drink alcohol, take sleeping pills, or take medicines that cause drowsiness for at least 24 hours. °· If you smoke, do not  smoke without supervision. °· If you are feeling better, you may resume normal activities 24 hours after you were sedated. °· Keep all appointments with your health care provider. °SEEK MEDICAL CARE IF: °· Your skin is pale or bluish in color. °· You continue to feel nauseous or vomit. °· Your pain is getting worse and is not helped by medicine. °· You have bleeding or swelling. °· You are still sleepy or feeling clumsy after 24 hours. °SEEK IMMEDIATE MEDICAL CARE IF: °· You develop a rash. °· You have difficulty breathing. °· You develop any type of allergic problem. °· You have a fever. °MAKE SURE YOU: °· Understand these instructions. °· Will watch your condition. °· Will get help right away if you are not doing well or get worse. °Document Released: 10/14/2012 Document Reviewed: 10/14/2012 °ExitCare® Patient Information ©2015 [employer]. This information is not intended to replace advice given to you by your health care provider. Make sure you discuss any questions you have with your health care provider. ° ° ° °

## 2014-03-03 NOTE — Procedures (Signed)
Interventional Radiology Procedure Note  Procedure: Placement of a right IJ approach single lumen PowerPort.  Tip is positioned at the superior cavoatrial junction and catheter is ready for immediate use.  Complications: No immediate Recommendations:  - Ok to shower tomorrow - Do not submerge for 7 days - Routine line care   Signed,  Tajee Savant S. Jesselle Laflamme, DO    

## 2014-03-03 NOTE — H&P (Signed)
Chief Complaint: "I'm here for a port" Referring Physician: Alvy Bimler HPI: Calvin Mccormick is an 69 y.o. male with myeloma.  He is scheduled for port placement today PMHx, meds, labs, imaging reviewed.   Past Medical History:  Past Medical History  Diagnosis Date  . Anemia 11/27/2010  . Hypertension, benign essential, goal below 140/90 11/27/2010  . Multiple myeloma without mention of remission 12/12/2010  . Multiple myeloma without mention of remission 12/12/2010  . CKD (chronic kidney disease), stage III 02/05/2011  . Hyperlipemia   . Diarrhea 06/17/2011    Hospital admission 05/30/11 velcade toxicity? Infectious?  . Chronic renal insufficiency, stage III (moderate) 06/17/2011    Due to myeloma & HTN  . Hypomagnesemia 07/29/2011  . Hypokalemia with normal acid-base balance 07/29/2011    Past Surgical History:  Past Surgical History  Procedure Laterality Date  . Colonoscopy  07/26/2011    Procedure: COLONOSCOPY;  Surgeon: Cleotis Nipper, MD;  Location: [facility];  Service: Endoscopy;  Laterality: N/A;    Family History:  Family History  Problem Relation Age of Onset  . Cancer Brother     lung ca    Social History:  reports that he has never smoked. He has never used smokeless tobacco. He reports that he does not drink alcohol or use illicit drugs.  Allergies:  Allergies  Allergen Reactions  . Lactose Intolerance (Gi) Nausea And Vomiting    Medications:   Medication List    ASK your doctor about these medications        allopurinol 100 MG tablet  Commonly known as:  ZYLOPRIM  Take 100 mg by mouth 2 (two) times daily.     amLODipine 10 MG tablet  Commonly known as:  NORVASC  TAKE 1 TABLET BY MOUTH EVERY DAY     atorvastatin 20 MG tablet  Commonly known as:  LIPITOR  Take 20 mg by mouth at bedtime.     dorzolamide-timolol 22.3-6.8 MG/ML ophthalmic solution  Commonly known as:  COSOPT     hydrochlorothiazide 25 MG tablet  Commonly known as:  HYDRODIURIL  TAKE  1 TABLET BY MOUTH EVERY DAY     KLOR-CON M20 20 MEQ tablet  Generic drug:  potassium chloride SA  TAKE 1 TABLET BY MOUTH EVERY DAY     lenalidomide 5 MG capsule  Commonly known as:  REVLIMID  Take 1 capsule (5 mg total) by mouth daily. Take 1 capsule daily for 21 days and then rest 7 days     lidocaine-prilocaine cream  Commonly known as:  EMLA  Apply 1 application topically as needed.     loperamide 2 MG tablet  Commonly known as:  IMODIUM A-D  1 po after each watery BM.  Maximum 6 per 24hrs.     ondansetron 8 MG disintegrating tablet  Commonly known as:  ZOFRAN-ODT  LET 1 TABLET DISSOLVE IN MOUTH AS DIRECTED EVERY 8 HOURS AS NEEDED FOR NAUSEA     PRESCRIPTION MEDICATION  [facility] Antibody Plan     traMADol 50 MG tablet  Commonly known as:  ULTRAM  Take 1 tablet (50 mg total) by mouth every 6 (six) hours as needed.     vitamin E 400 UNIT capsule  Generic drug:  vitamin E  Take 400 Units by mouth daily.        Please HPI for pertinent positives, otherwise complete 10 system ROS negative.  Physical Exam: BP 137/69 mmHg  Pulse 55  Temp(Src) 97.8 F (36.6 C) (Oral)  Resp 18  SpO2 100% There is no weight on file to calculate BMI.   General Appearance:  Alert, cooperative, no distress, appears stated age  Head:  Normocephalic, without obvious abnormality, atraumatic  ENT: Unremarkable  Neck: Supple, symmetrical, trachea midline  Lungs:   Clear to auscultation bilaterally, no w/r/r, respirations unlabored without use of accessory muscles.  Chest Wall:  No tenderness or deformity  Heart:  Regular rate and rhythm, S1, S2 normal, no murmur, rub or gallop.  Neurologic: Normal affect, no gross deficits.  Labs: Results for orders placed or performed during the hospital encounter of 03/03/14 (from the past 48 hour(s))  CBC with Differential/Platelet     Status: Abnormal (Preliminary result)   Collection Time: 03/03/14 11:20 AM  Result Value Ref Range   WBC 8.6 4.0 - 10.5  K/uL   RBC 3.64 (L) 4.22 - 5.81 MIL/uL   Hemoglobin 10.7 (L) 13.0 - 17.0 g/dL   HCT 32.7 (L) 39.0 - 52.0 %   MCV 89.8 78.0 - 100.0 fL   MCH 29.4 26.0 - 34.0 pg   MCHC 32.7 30.0 - 36.0 g/dL   RDW 14.1 11.5 - 15.5 %   Platelets 387 150 - 400 K/uL   Neutrophils Relative % PENDING 43 - 77 %   Neutro Abs PENDING 1.7 - 7.7 K/uL   Band Neutrophils PENDING 0 - 10 %   Lymphocytes Relative PENDING 12 - 46 %   Lymphs Abs PENDING 0.7 - 4.0 K/uL   Monocytes Relative PENDING 3 - 12 %   Monocytes Absolute PENDING 0.1 - 1.0 K/uL   Eosinophils Relative PENDING 0 - 5 %   Eosinophils Absolute PENDING 0.0 - 0.7 K/uL   Basophils Relative PENDING 0 - 1 %   Basophils Absolute PENDING 0.0 - 0.1 K/uL   WBC Morphology PENDING    RBC Morphology PENDING    Smear Review PENDING    nRBC PENDING 0 /100 WBC   Metamyelocytes Relative PENDING %   Myelocytes PENDING %   Promyelocytes Absolute PENDING %   Blasts PENDING %  Protime-INR     Status: None   Collection Time: 03/03/14 11:20 AM  Result Value Ref Range   Prothrombin Time 13.3 11.6 - 15.2 seconds   INR 1.00 0.00 - 1.49    Imaging: No results found.  Assessment/Plan Myeloma For port placement Explained procedure, risks, complications, use of sedation Labs reviewed Consent signed in chart  Ascencion Dike PA-C 03/03/2014, 12:47 PM

## 2014-03-03 NOTE — Telephone Encounter (Signed)
EMLA CREAM E-SCRIBED TO [facility].

## 2014-03-08 ENCOUNTER — Other Ambulatory Visit: Payer: Self-pay | Admitting: Hematology and Oncology

## 2014-03-08 ENCOUNTER — Telehealth: Payer: Self-pay | Admitting: Medical Oncology

## 2014-03-08 NOTE — Telephone Encounter (Signed)
Instructions given on how t apply EMLA cream asn hours prior to visit thursday

## 2014-03-10 ENCOUNTER — Telehealth: Payer: Self-pay | Admitting: Hematology and Oncology

## 2014-03-10 ENCOUNTER — Telehealth: Payer: Self-pay | Admitting: *Deleted

## 2014-03-10 ENCOUNTER — Encounter: Payer: Self-pay | Admitting: Hematology and Oncology

## 2014-03-10 ENCOUNTER — Ambulatory Visit (HOSPITAL_BASED_OUTPATIENT_CLINIC_OR_DEPARTMENT_OTHER): Payer: Medicare PPO | Admitting: Hematology and Oncology

## 2014-03-10 ENCOUNTER — Ambulatory Visit (HOSPITAL_BASED_OUTPATIENT_CLINIC_OR_DEPARTMENT_OTHER): Payer: Medicare PPO

## 2014-03-10 VITALS — BP 141/65 | HR 67 | Temp 97.8°F | Resp 18 | Ht 71.0 in | Wt 179.6 lb

## 2014-03-10 DIAGNOSIS — C9002 Multiple myeloma in relapse: Secondary | ICD-10-CM

## 2014-03-10 DIAGNOSIS — Z5112 Encounter for antineoplastic immunotherapy: Secondary | ICD-10-CM | POA: Diagnosis not present

## 2014-03-10 DIAGNOSIS — C9000 Multiple myeloma not having achieved remission: Secondary | ICD-10-CM

## 2014-03-10 DIAGNOSIS — N183 Chronic kidney disease, stage 3 unspecified: Secondary | ICD-10-CM

## 2014-03-10 DIAGNOSIS — D631 Anemia in chronic kidney disease: Secondary | ICD-10-CM | POA: Diagnosis not present

## 2014-03-10 DIAGNOSIS — N189 Chronic kidney disease, unspecified: Secondary | ICD-10-CM

## 2014-03-10 MED ORDER — DEXAMETHASONE SODIUM PHOSPHATE 10 MG/ML IJ SOLN
INTRAMUSCULAR | Status: AC
  Filled 2014-03-10: qty 1

## 2014-03-10 MED ORDER — FAMOTIDINE IN NACL 20-0.9 MG/50ML-% IV SOLN
20.0000 mg | Freq: Once | INTRAVENOUS | Status: AC
  Administered 2014-03-10: 20 mg via INTRAVENOUS

## 2014-03-10 MED ORDER — DIPHENHYDRAMINE HCL 25 MG PO CAPS
ORAL_CAPSULE | ORAL | Status: AC
  Filled 2014-03-10: qty 2

## 2014-03-10 MED ORDER — DEXAMETHASONE SODIUM PHOSPHATE 10 MG/ML IJ SOLN
10.0000 mg | Freq: Once | INTRAMUSCULAR | Status: AC
  Administered 2014-03-10: 10 mg via INTRAVENOUS

## 2014-03-10 MED ORDER — SODIUM CHLORIDE 0.9 % IJ SOLN
10.0000 mL | INTRAMUSCULAR | Status: DC | PRN
  Administered 2014-03-10: 10 mL
  Filled 2014-03-10: qty 10

## 2014-03-10 MED ORDER — ACETAMINOPHEN 325 MG PO TABS
650.0000 mg | ORAL_TABLET | Freq: Once | ORAL | Status: AC
  Administered 2014-03-10: 650 mg via ORAL

## 2014-03-10 MED ORDER — FAMOTIDINE IN NACL 20-0.9 MG/50ML-% IV SOLN
INTRAVENOUS | Status: AC
  Filled 2014-03-10: qty 50

## 2014-03-10 MED ORDER — DIPHENHYDRAMINE HCL 25 MG PO CAPS
50.0000 mg | ORAL_CAPSULE | Freq: Once | ORAL | Status: AC
  Administered 2014-03-10: 50 mg via ORAL

## 2014-03-10 MED ORDER — ACETAMINOPHEN 325 MG PO TABS
ORAL_TABLET | ORAL | Status: AC
  Filled 2014-03-10: qty 2

## 2014-03-10 MED ORDER — SODIUM CHLORIDE 0.9 % IV SOLN
INTRAVENOUS | Status: DC
  Administered 2014-03-10: 10:00:00 via INTRAVENOUS

## 2014-03-10 MED ORDER — HEPARIN SOD (PORK) LOCK FLUSH 100 UNIT/ML IV SOLN
500.0000 [IU] | Freq: Once | INTRAVENOUS | Status: AC | PRN
  Administered 2014-03-10: 500 [IU]
  Filled 2014-03-10: qty 5

## 2014-03-10 MED ORDER — SODIUM CHLORIDE 0.9 % IV SOLN
Freq: Once | Status: AC
  Administered 2014-03-10: 11:00:00 via INTRAVENOUS
  Filled 2014-03-10: qty 230

## 2014-03-10 NOTE — Assessment & Plan Note (Addendum)
The decision was made based on recent publication on NEJM. Role of treatment is palliative.  Elotuzumab Therapy for Relapsed or Refractory Multiple Myeloma Almyra Free, M.D., Meletios Dimopoulos, M.D., Lucienne Capers, M.D., Cephus Shelling, M.D., Laurin Coder, M.D., Ph.D., Mickel Crow, M.D., Rene Kocher, M.D., Stacie Acres, M.D., Maria-Victoria Annett Fabian, M.D., Ph.D., Hila Hyman Bower, M.D., Jodelle Green, M.D., Arvil Persons, M.D., Rolanda Jay, M.D., Judie Bonus, M.D., Anselmo Rod, M.D., Gasper Sells. Marisa Hua, M.D., Bertis Ruddy, M.D., Signa Kell, M.D., Katrina Stack, M.D., Freda Jackson, M.D., Tawanna Sat, Ph.D., Milagros Evener, M.D., Gerrie Nordmann, M.D., Alain Marion, M.D., Ph.D., Cyd Silence, M.D., Clearnce Hasten, M.[place]., Nadean Corwin. Ouida Sills, M.D., and Renaldo Harrison, M.D. for the [facility]ators N Engl J Med [phone] 13, 2015DOI: [id number]  Elotuzumab, an immunostimulatory monoclonal antibody targeting signaling lymphocytic activation molecule F7 (SLAMF7), showed activity in combination with lenalidomide and dexamethasone.  After a median follow-up of 24.5 months, the rate of progression-free survival at 1 year in the elotuzumab group was 68%. Median progression-free survival in the elotuzumab group was 19.4 months. The overall response rate in the elotuzumab group was 79%. Common grade 3 or 4 adverse events in the two groups were lymphocytopenia, neutropenia, fatigue, and pneumonia. Infusion reactions occurred in 33 patients (10%) in the elotuzumab group and were grade 1 or 2 in 29 patients. Some of the short term side-effects included, though not limited to, risk of fatigue, weight loss, tumor lysis syndrome, risk of allergic reactions, pancytopenia, life-threatening infections, need for transfusions of blood products, admission to hospital for various reasons, and risks of death.   The patient is aware that the response rates  discussed earlier is not guaranteed.   We have a long discussion about the non-diagnostic bone marrow biopsy which showed only 5% plasma cell.  There is limitation relating to the bone marrow biopsy as I was not able to get a good sample as his bones are fragmented after multiple attempts. We discussed about the danger of treatment or delaying treatment until the future. The patient had evidence of anemia and chronic renal failure which May or may not be related to his disease. After a long discussion, patient made an informed decision to proceed with the prescribed plan of care.   I will see him back prior to third treatment.  he will take aspirin for preventative against risk of thrombosis

## 2014-03-10 NOTE — Assessment & Plan Note (Signed)
This is chronic in nature. It is stable. Recommend observation only.

## 2014-03-10 NOTE — Progress Notes (Signed)
[facility] OFFICE PROGRESS NOTE  Patient Care Team: Lucianne Lei, MD as PCP - General (Family Medicine)  SUMMARY OF ONCOLOGIC HISTORY:   Multiple myeloma   12/12/2010 Initial Diagnosis Multiple myeloma   02/16/2014 Imaging Skeletal survey showed diffuse osteopenia   03/02/2014 Bone Marrow Biopsy Accession: [id number] BM biopsy showed only 5 % plasma cells. However, the biopsy was difficult and the bone was fragmented during the procedure   03/03/2014 Procedure he has port placement   03/10/2014 -  Chemotherapy he received elotuzumab and revlimid    INTERVAL HISTORY: Please see below for problem oriented charting. He returns to review test results, prior to start of treatment.  we review his bone marrow results and blood work. We also review results of his imaging study  REVIEW OF SYSTEMS:   Constitutional: Denies fevers, chills or abnormal weight loss Eyes: Denies blurriness of vision Ears, nose, mouth, throat, and face: Denies mucositis or sore throat Respiratory: Denies cough, dyspnea or wheezes Cardiovascular: Denies palpitation, chest discomfort or lower extremity swelling Gastrointestinal:  Denies nausea, heartburn or change in bowel habits Skin: Denies abnormal skin rashes Lymphatics: Denies new lymphadenopathy or easy bruising Neurological:Denies numbness, tingling or new weaknesses Behavioral/Psych: Mood is stable, no new changes  All other systems were reviewed with the patient and are negative.  I have reviewed the past medical history, past surgical history, social history and family history with the patient and they are unchanged from previous note.  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  MEDICATIONS:  Current Outpatient Prescriptions  Medication Sig Dispense Refill  . allopurinol (ZYLOPRIM) 100 MG tablet Take 100 mg by mouth 2 (two) times daily.     Marland Kitchen amLODipine (NORVASC) 10 MG tablet TAKE 1 TABLET BY MOUTH EVERY DAY 30 tablet 1  . aspirin  EC 81 MG tablet Take 81 mg by mouth daily.    Marland Kitchen atorvastatin (LIPITOR) 20 MG tablet Take 20 mg by mouth at bedtime.     . cholecalciferol (VITAMIN D) 1000 UNITS tablet Take 1,000 Units by mouth daily.    . dorzolamide-timolol (COSOPT) 22.3-6.8 MG/ML ophthalmic solution     . hydrochlorothiazide (HYDRODIURIL) 25 MG tablet TAKE 1 TABLET BY MOUTH EVERY DAY 90 tablet 4  . KLOR-CON M20 20 MEQ tablet TAKE 1 TABLET BY MOUTH EVERY DAY 90 tablet 1  . lenalidomide (REVLIMID) 5 MG capsule Take 1 capsule (5 mg total) by mouth daily. Take 1 capsule daily for 21 days and then rest 7 days 21 capsule 0  . lidocaine-prilocaine (EMLA) cream Apply 1 application topically as needed. 30 g 0  . loperamide (IMODIUM A-D) 2 MG tablet 1 po after each watery BM.  Maximum 6 per 24hrs. 30 tablet PRN  . PRESCRIPTION MEDICATION [facility] Antibody Plan     No current facility-administered medications for this visit.   Facility-Administered Medications Ordered in Other Visits  Medication Dose Route Frequency Provider Last Rate Last Dose  . 0.9 %  sodium chloride infusion   Intravenous Continuous Heath Lark, MD 10 mL/hr at 03/10/14 0950    . Elotuzumab 865m in sodium chloride 0.9% 2387m  Intravenous Once NiHeath LarkMD      . heparin lock flush 100 unit/mL  500 Units Intracatheter Once PRN NiHeath LarkMD      . sodium chloride 0.9 % injection 10 mL  10 mL Intracatheter PRN NiHeath LarkMD        PHYSICAL EXAMINATION: ECOG PERFORMANCE STATUS: 0 - Asymptomatic  Filed Vitals:   03/10/14 0837  BP: 141/65  Pulse: 67  Temp: 97.8 F (36.6 C)  Resp: 18   Filed Weights   03/10/14 0837  Weight: 179 lb 9.6 oz (81.466 kg)    GENERAL:alert, no distress and comfortable SKIN: skin color, texture, turgor are normal, no rashes or significant lesions EYES: normal, Conjunctiva are pink and non-injected, sclera clear OROPHARYNX:no exudate, no erythema and lips, buccal mucosa, and tongue normal . Poor dentition is noted NECK: supple,  thyroid normal size, non-tender, without nodularity LYMPH:  no palpable lymphadenopathy in the cervical, axillary or inguinal LUNGS: clear to auscultation and percussion with normal breathing effort HEART: regular rate & rhythm and no murmurs and no lower extremity edema ABDOMEN:abdomen soft, non-tender and normal bowel sounds Musculoskeletal:no cyanosis of digits and no clubbing  NEURO: alert & oriented x 3 with fluent speech, no focal motor/sensory deficits  LABORATORY DATA:  I have reviewed the data as listed    Component Value Date/Time   NA 139 03/02/2014 0815   NA 132* 04/02/2012 1705   K 4.3 03/02/2014 0815   K 4.0 04/02/2012 1705   CL 107 06/29/2012 1131   CL 92* 04/02/2012 1705   CO2 23 03/02/2014 0815   CO2 29 04/02/2012 1705   GLUCOSE 84 03/02/2014 0815   GLUCOSE 143* 06/29/2012 1131   GLUCOSE 105* 04/02/2012 1705   BUN 18.8 03/02/2014 0815   BUN 21 04/02/2012 1705   CREATININE 1.7* 03/02/2014 0815   CREATININE 1.89* 04/02/2012 1705   CREATININE 2.03* 12/14/2010 1114   CALCIUM 9.5 03/02/2014 0815   CALCIUM 8.0* 04/02/2012 1705   PROT 7.8 03/02/2014 0815   PROT 4.9* 08/05/2011 1100   ALBUMIN 3.7 03/02/2014 0815   ALBUMIN 2.4* 08/05/2011 1100   AST 20 03/02/2014 0815   AST 31 08/05/2011 1100   ALT 10 03/02/2014 0815   ALT 45 08/05/2011 1100   ALKPHOS 108 03/02/2014 0815   ALKPHOS 182* 08/05/2011 1100   BILITOT 0.42 03/02/2014 0815   BILITOT 0.5 08/05/2011 1100   GFRNONAA 84* 07/28/2011 0341   GFRAA >90 07/28/2011 0341    No results found for: SPEP, UPEP  Lab Results  Component Value Date   WBC 8.6 03/03/2014   NEUTROABS 4.3 03/03/2014   HGB 10.7* 03/03/2014   HCT 32.7* 03/03/2014   MCV 89.8 03/03/2014   PLT 387 03/03/2014      Chemistry      Component Value Date/Time   NA 139 03/02/2014 0815   NA 132* 04/02/2012 1705   K 4.3 03/02/2014 0815   K 4.0 04/02/2012 1705   CL 107 06/29/2012 1131   CL 92* 04/02/2012 1705   CO2 23 03/02/2014 0815    CO2 29 04/02/2012 1705   BUN 18.8 03/02/2014 0815   BUN 21 04/02/2012 1705   CREATININE 1.7* 03/02/2014 0815   CREATININE 1.89* 04/02/2012 1705   CREATININE 2.03* 12/14/2010 1114      Component Value Date/Time   CALCIUM 9.5 03/02/2014 0815   CALCIUM 8.0* 04/02/2012 1705   ALKPHOS 108 03/02/2014 0815   ALKPHOS 182* 08/05/2011 1100   AST 20 03/02/2014 0815   AST 31 08/05/2011 1100   ALT 10 03/02/2014 0815   ALT 45 08/05/2011 1100   BILITOT 0.42 03/02/2014 0815   BILITOT 0.5 08/05/2011 1100       RADIOGRAPHIC STUDIES: I reviewed the recent skeletal survey I  have personally reviewed the radiological images as listed and agreed with the findings in the  report.  ASSESSMENT & PLAN:  Multiple myeloma The decision was made based on recent publication on NEJM. Role of treatment is palliative.  Elotuzumab Therapy for Relapsed or Refractory Multiple Myeloma Almyra Free, M.D., Meletios Dimopoulos, M.D., Lucienne Capers, M.D., Cephus Shelling, M.D., Laurin Coder, M.D., Ph.D., Mickel Crow, M.D., Rene Kocher, M.D., Stacie Acres, M.D., Maria-Victoria Annett Fabian, M.D., Ph.D., Hila Hyman Bower, M.D., Jodelle Green, M.D., Arvil Persons, M.D., Rolanda Jay, M.D., Judie Bonus, M.D., Anselmo Rod, M.D., Gasper Sells. Marisa Hua, M.D., Bertis Ruddy, M.D., Signa Kell, M.D., Katrina Stack, M.D., Freda Jackson, M.D., Tawanna Sat, Ph.D., Milagros Evener, M.D., Gerrie Nordmann, M.D., Alain Marion, M.D., Ph.D., Cyd Silence, M.D., Clearnce Hasten, M.[place]., Nadean Corwin. Ouida Sills, M.D., and Renaldo Harrison, M.D. for the [facility] Investigators N Engl J Med [id number], 2015DOI: 10.1056/[id number]  Elotuzumab, an immunostimulatory monoclonal antibody targeting signaling lymphocytic activation molecule F7 (SLAMF7), showed activity in combination with lenalidomide and dexamethasone.  After a median follow-up of 24.5 months, the rate of progression-free survival at 1 year in the  elotuzumab group was 68%. Median progression-free survival in the elotuzumab group was 19.4 months. The overall response rate in the elotuzumab group was 79%. Common grade 3 or 4 adverse events in the two groups were lymphocytopenia, neutropenia, fatigue, and pneumonia. Infusion reactions occurred in 33 patients (10%) in the elotuzumab group and were grade 1 or 2 in 29 patients. Some of the short term side-effects included, though not limited to, risk of fatigue, weight loss, tumor lysis syndrome, risk of allergic reactions, pancytopenia, life-threatening infections, need for transfusions of blood products, admission to hospital for various reasons, and risks of death.   The patient is aware that the response rates discussed earlier is not guaranteed.   We have a long discussion about the non-diagnostic bone marrow biopsy which showed only 5% plasma cell.  There is limitation relating to the bone marrow biopsy as I was not able to get a good sample as his bones are fragmented after multiple attempts. We discussed about the danger of treatment or delaying treatment until the future. The patient had evidence of anemia and chronic renal failure which May or may not be related to his disease. After a long discussion, patient made an informed decision to proceed with the prescribed plan of care.   I will see him back prior to third treatment.  he will take aspirin for preventative against risk of thrombosis    Anemia in chronic renal disease This is likely anemia of chronic disease from renal failure and relapsed myeloma. The patient denies recent history of bleeding such as epistaxis, hematuria or hematochezia. He is asymptomatic from the anemia. We will observe for now.  He does not require transfusion now.    Chronic renal insufficiency, stage III (moderate) This is chronic in nature. It is stable. Recommend observation only.   the patient will proceed to obtain dental clearance before we start him  on Zometa  Orders Placed This Encounter  Procedures  . Comprehensive metabolic panel    Standing Status: Standing     Number of Occurrences: 9     Standing Expiration Date: 03/10/2015  . CBC with Differential/Platelet    Standing Status: Standing     Number of Occurrences: 9     Standing Expiration Date: 03/10/2015   All questions were answered. The patient knows to call the clinic with any problems, questions or concerns. No barriers to learning was detected. I spent 30 minutes counseling the patient face to face. The total  time spent in the appointment was 40 minutes and more than 50% was on counseling and review of test results     [facility], Ahrianna Siglin, MD 03/10/2014 10:45 AM

## 2014-03-10 NOTE — Patient Instructions (Addendum)
[place] Discharge Instructions for Patients Receiving Chemotherapy  Today you received the following chemotherapy agent: Elotuzumab  The most common side effects of your medication are: fatigue, neutropenia, diarrhea, constipation, fever, and/or cough. Please call our clinic so we can assist you in managing any of these symptoms.  Please take Aspirin to prevent blood clot formation.   To help prevent nausea and vomiting after your treatment, we encourage you to take your nausea medication.  If you develop nausea, please call our clinic.    If you develop nausea and vomiting that is not controlled by your nausea medication, call the clinic.   BELOW ARE SYMPTOMS THAT SHOULD BE REPORTED IMMEDIATELY:  *FEVER GREATER THAN 100.5 F  *CHILLS WITH OR WITHOUT FEVER  NAUSEA AND VOMITING THAT IS NOT CONTROLLED WITH YOUR NAUSEA MEDICATION  *UNUSUAL SHORTNESS OF BREATH  *UNUSUAL BRUISING OR BLEEDING  TENDERNESS IN MOUTH AND THROAT WITH OR WITHOUT PRESENCE OF ULCERS  *URINARY PROBLEMS  *BOWEL PROBLEMS  UNUSUAL RASH Items with * indicate a potential emergency and should be followed up as soon as possible.  Feel free to call the clinic you have any questions or concerns. The clinic phone number is [phone].

## 2014-03-10 NOTE — Assessment & Plan Note (Signed)
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 The patient denies recent history of bleeding such as epistaxis, hematuria or hematochezia. He is asymptomatic from the anemia. We will observe for now.  He does not require transfusion now.

## 2014-03-10 NOTE — Progress Notes (Signed)
Patient did not take dexamethasone today prior to arrival for 1st Elotuzumab dose. Patient received 10 mg IV decadron with premeds today. OK to proceed with treatment per Dr. Alvy Bimler.   Patient tolerated 1st dose Elotuzumab without complications or delays at max rate. VSS throughout and at discharge. Neutropenic precautions and symptom management reviewed with AVS. Chemo Alert Card given. Patient and spouse verbalize understanding of teaching and are discharged ambulatory to lobby in stable condition.

## 2014-03-10 NOTE — Telephone Encounter (Signed)
Gave avs & calendar for March. Sent message to schedule treatment. °

## 2014-03-10 NOTE — Telephone Encounter (Signed)
Per staff message and POF I have scheduled appts. Advised scheduler of appts. JMW  

## 2014-03-11 ENCOUNTER — Telehealth: Payer: Self-pay

## 2014-03-11 DIAGNOSIS — C9000 Multiple myeloma not having achieved remission: Secondary | ICD-10-CM

## 2014-03-11 MED ORDER — ONDANSETRON HCL 8 MG PO TABS: 8.0000 mg | ORAL_TABLET | Freq: Three times a day (TID) | ORAL | Status: DC | PRN

## 2014-03-11 NOTE — Telephone Encounter (Signed)
S/w pt. He states he is doing well. Is eating, has drunk about 32 oz water so far. He has no gi/gu complaints. He started revlimid last night. He does not have any nausea meds at home for if needed. S/w Dr Alvy Bimler and she ordered zofran. Called pt back and got answering machine. lvm prescription is e-scribed to [facility] in [place].

## 2014-03-14 ENCOUNTER — Telehealth: Payer: Self-pay | Admitting: Hematology and Oncology

## 2014-03-14 ENCOUNTER — Telehealth: Payer: Self-pay | Admitting: *Deleted

## 2014-03-14 NOTE — Telephone Encounter (Signed)
s.w. pt and advised on 3.17 MD visit moved to later time...pt ok and aware

## 2014-03-14 NOTE — Telephone Encounter (Signed)
Patient called saying he went to [facility] on 03/11/14 and they did not have an Rx for Zofran for him.  He has not checked back with them.  Called [facility] in [place] and they have the Rx ready for him.  Called him to relay that information.

## 2014-03-16 LAB — CHROMOSOME ANALYSIS, BONE MARROW

## 2014-03-17 ENCOUNTER — Ambulatory Visit (HOSPITAL_BASED_OUTPATIENT_CLINIC_OR_DEPARTMENT_OTHER): Payer: Medicare PPO

## 2014-03-17 ENCOUNTER — Encounter: Payer: Self-pay | Admitting: *Deleted

## 2014-03-17 ENCOUNTER — Other Ambulatory Visit: Payer: Self-pay | Admitting: Medical Oncology

## 2014-03-17 ENCOUNTER — Other Ambulatory Visit: Payer: Self-pay | Admitting: Hematology and Oncology

## 2014-03-17 DIAGNOSIS — C9000 Multiple myeloma not having achieved remission: Secondary | ICD-10-CM

## 2014-03-17 DIAGNOSIS — Z5112 Encounter for antineoplastic immunotherapy: Secondary | ICD-10-CM | POA: Diagnosis not present

## 2014-03-17 MED ORDER — SODIUM CHLORIDE 0.9 % IV SOLN
Freq: Once | INTRAVENOUS | Status: AC
  Administered 2014-03-17: 10:00:00 via INTRAVENOUS
  Filled 2014-03-17: qty 4

## 2014-03-17 MED ORDER — ACETAMINOPHEN 325 MG PO TABS
ORAL_TABLET | ORAL | Status: AC
  Filled 2014-03-17: qty 2

## 2014-03-17 MED ORDER — ELOTUZUMAB CHEMO INJECTION 400 MG
9.9000 mg/kg | Freq: Once | INTRAVENOUS | Status: AC
  Administered 2014-03-17: 800 mg via INTRAVENOUS
  Filled 2014-03-17: qty 32

## 2014-03-17 MED ORDER — FAMOTIDINE IN NACL 20-0.9 MG/50ML-% IV SOLN
INTRAVENOUS | Status: AC
  Filled 2014-03-17: qty 50

## 2014-03-17 MED ORDER — HEPARIN SOD (PORK) LOCK FLUSH 100 UNIT/ML IV SOLN
500.0000 [IU] | Freq: Once | INTRAVENOUS | Status: AC | PRN
  Administered 2014-03-17: 500 [IU]
  Filled 2014-03-17: qty 5

## 2014-03-17 MED ORDER — FAMOTIDINE IN NACL 20-0.9 MG/50ML-% IV SOLN
20.0000 mg | Freq: Once | INTRAVENOUS | Status: AC
  Administered 2014-03-17: 20 mg via INTRAVENOUS

## 2014-03-17 MED ORDER — DEXAMETHASONE 4 MG PO TABS: 8.0000 mg | ORAL_TABLET | ORAL | Status: DC

## 2014-03-17 MED ORDER — ACETAMINOPHEN 325 MG PO TABS
650.0000 mg | ORAL_TABLET | Freq: Once | ORAL | Status: AC
  Administered 2014-03-17: 650 mg via ORAL

## 2014-03-17 MED ORDER — SODIUM CHLORIDE 0.9 % IJ SOLN
10.0000 mL | INTRAMUSCULAR | Status: DC | PRN
  Administered 2014-03-17: 10 mL
  Filled 2014-03-17: qty 10

## 2014-03-17 MED ORDER — DIPHENHYDRAMINE HCL 25 MG PO CAPS
50.0000 mg | ORAL_CAPSULE | Freq: Once | ORAL | Status: AC
  Administered 2014-03-17: 50 mg via ORAL

## 2014-03-17 MED ORDER — DEXAMETHASONE 4 MG PO TABS
ORAL_TABLET | ORAL | Status: AC
  Filled 2014-03-17: qty 2

## 2014-03-17 MED ORDER — SODIUM CHLORIDE 0.9 % IV SOLN
Freq: Once | INTRAVENOUS | Status: AC
  Administered 2014-03-17: 10:00:00 via INTRAVENOUS

## 2014-03-17 NOTE — Progress Notes (Signed)
VSS and tolerating dose two Elotuzumab well.

## 2014-03-17 NOTE — Progress Notes (Signed)
ELOTUZUMAB RATE INCREASED TO 2 ML/MIN.

## 2014-03-17 NOTE — Progress Notes (Signed)
Phone order received and read back from Dr. Alvy Bimler to use labs from 03-03-2014 for today's D8, C1 treatment.

## 2014-03-17 NOTE — Patient Instructions (Signed)
[facility] Discharge Instructions for Patients Receiving Chemotherapy  Today you received the following chemotherapy agents Elotuzamab.  To help prevent nausea and vomiting after your treatment, we encourage you to take your nausea medication Zofran 8 mg every eight hours or three times a day if needed.   If you develop nausea and vomiting that is not controlled by your nausea medication, call the clinic.   BELOW ARE SYMPTOMS THAT SHOULD BE REPORTED IMMEDIATELY:  *FEVER GREATER THAN 100.5 F  *CHILLS WITH OR WITHOUT FEVER  NAUSEA AND VOMITING THAT IS NOT CONTROLLED WITH YOUR NAUSEA MEDICATION  *UNUSUAL SHORTNESS OF BREATH  *UNUSUAL BRUISING OR BLEEDING  TENDERNESS IN MOUTH AND THROAT WITH OR WITHOUT PRESENCE OF ULCERS  *URINARY PROBLEMS  *BOWEL PROBLEMS  UNUSUAL RASH Items with * indicate a potential emergency and should be followed up as soon as possible.  Feel free to call the clinic you have any questions or concerns. The clinic phone number is [phone].

## 2014-03-17 NOTE — Progress Notes (Signed)
[facility] Clinical Social Work  Clinical Social Work was referred by patient's spouse for assessment of psychosocial needs.  Clinical Social Worker met with patient's spouse in CSW office to offer support and assess for needs.  Mrs. Lantry shared that patient is back in treatment and they are requesting assistance for transportation, specifically to pay for gas.  CSW provided patient's family with information [facility] for transportation assistance grant.  Patient's spouse verbalized understanding and plans to contact [facility] as soon as possible.   Lauren Mullis, MSW, LCSW, OSW-C Clinical Social Worker [facility] [phone]        

## 2014-03-22 ENCOUNTER — Encounter (HOSPITAL_COMMUNITY): Payer: Self-pay

## 2014-03-23 LAB — TISSUE HYBRIDIZATION (BONE MARROW)-NCBH

## 2014-03-24 ENCOUNTER — Ambulatory Visit: Payer: Medicare PPO

## 2014-03-24 ENCOUNTER — Telehealth: Payer: Self-pay | Admitting: Hematology and Oncology

## 2014-03-24 ENCOUNTER — Encounter: Payer: Self-pay | Admitting: Hematology and Oncology

## 2014-03-24 ENCOUNTER — Other Ambulatory Visit (HOSPITAL_BASED_OUTPATIENT_CLINIC_OR_DEPARTMENT_OTHER): Payer: Medicare PPO

## 2014-03-24 ENCOUNTER — Ambulatory Visit: Payer: 59 | Admitting: Hematology and Oncology

## 2014-03-24 ENCOUNTER — Ambulatory Visit (HOSPITAL_BASED_OUTPATIENT_CLINIC_OR_DEPARTMENT_OTHER): Payer: Medicare PPO | Admitting: Hematology and Oncology

## 2014-03-24 ENCOUNTER — Ambulatory Visit (HOSPITAL_BASED_OUTPATIENT_CLINIC_OR_DEPARTMENT_OTHER): Payer: Medicare PPO

## 2014-03-24 DIAGNOSIS — C9000 Multiple myeloma not having achieved remission: Secondary | ICD-10-CM

## 2014-03-24 DIAGNOSIS — D631 Anemia in chronic kidney disease: Secondary | ICD-10-CM

## 2014-03-24 DIAGNOSIS — N183 Chronic kidney disease, stage 3 (moderate): Secondary | ICD-10-CM | POA: Diagnosis not present

## 2014-03-24 DIAGNOSIS — C9002 Multiple myeloma in relapse: Secondary | ICD-10-CM

## 2014-03-24 DIAGNOSIS — Z5112 Encounter for antineoplastic immunotherapy: Secondary | ICD-10-CM | POA: Diagnosis not present

## 2014-03-24 DIAGNOSIS — G620 Drug-induced polyneuropathy: Secondary | ICD-10-CM

## 2014-03-24 DIAGNOSIS — Z95828 Presence of other vascular implants and grafts: Secondary | ICD-10-CM

## 2014-03-24 LAB — COMPREHENSIVE METABOLIC PANEL (CC13)
ALT: 21 U/L (ref 0–55)
AST: 15 U/L (ref 5–34)
Albumin: 3.2 g/dL — ABNORMAL LOW (ref 3.5–5.0)
Alkaline Phosphatase: 104 U/L (ref 40–150)
Anion Gap: 9 mEq/L (ref 3–11)
BUN: 19.8 mg/dL (ref 7.0–26.0)
CO2: 22 mEq/L (ref 22–29)
Calcium: 8.8 mg/dL (ref 8.4–10.4)
Chloride: 105 mEq/L (ref 98–109)
Creatinine: 1.7 mg/dL — ABNORMAL HIGH (ref 0.7–1.3)
EGFR: 46 mL/min/{1.73_m2} — AB (ref >90)
Glucose: 89 mg/dl (ref 70–140)
POTASSIUM: 4.2 meq/L (ref 3.5–5.1)
Sodium: 136 mEq/L (ref 136–145)
Total Bilirubin: 0.55 mg/dL (ref 0.20–1.20)
Total Protein: 7 g/dL (ref 6.4–8.3)

## 2014-03-24 LAB — CBC WITH DIFFERENTIAL/PLATELET
BASO%: 0.8 % (ref 0.0–2.0)
Basophils Absolute: 0.1 10*3/uL (ref 0.0–0.1)
EOS ABS: 0.6 10*3/uL — AB (ref 0.0–0.5)
EOS%: 5 % (ref 0.0–7.0)
HCT: 33.3 % — ABNORMAL LOW (ref 38.4–49.9)
HEMOGLOBIN: 10.2 g/dL — AB (ref 13.0–17.1)
LYMPH%: 14.7 % (ref 14.0–49.0)
MCH: 27.9 pg (ref 27.2–33.4)
MCHC: 30.6 g/dL — AB (ref 32.0–36.0)
MCV: 91.1 fL (ref 79.3–98.0)
MONO#: 1.2 10*3/uL — ABNORMAL HIGH (ref 0.1–0.9)
MONO%: 10.7 % (ref 0.0–14.0)
NEUT%: 68.8 % (ref 39.0–75.0)
NEUTROS ABS: 8 10*3/uL — AB (ref 1.5–6.5)
PLATELETS: 358 10*3/uL (ref 140–400)
RBC: 3.66 10*6/uL — ABNORMAL LOW (ref 4.20–5.82)
RDW: 15.7 % — ABNORMAL HIGH (ref 11.0–14.6)
WBC: 11.7 10*3/uL — AB (ref 4.0–10.3)
lymph#: 1.7 10*3/uL (ref 0.9–3.3)

## 2014-03-24 MED ORDER — SODIUM CHLORIDE 0.9 % IV SOLN
Freq: Once | INTRAVENOUS | Status: AC
  Administered 2014-03-24: 11:00:00 via INTRAVENOUS

## 2014-03-24 MED ORDER — HEPARIN SOD (PORK) LOCK FLUSH 100 UNIT/ML IV SOLN
500.0000 [IU] | Freq: Once | INTRAVENOUS | Status: AC | PRN
  Administered 2014-03-24: 500 [IU]
  Filled 2014-03-24: qty 5

## 2014-03-24 MED ORDER — SODIUM CHLORIDE 0.9 % IJ SOLN
10.0000 mL | INTRAMUSCULAR | Status: DC | PRN
  Administered 2014-03-24: 10 mL via INTRAVENOUS
  Filled 2014-03-24: qty 10

## 2014-03-24 MED ORDER — SODIUM CHLORIDE 0.9 % IV SOLN
9.8000 mg/kg | Freq: Once | INTRAVENOUS | Status: AC
  Administered 2014-03-24: 800 mg via INTRAVENOUS
  Filled 2014-03-24: qty 32

## 2014-03-24 MED ORDER — FAMOTIDINE IN NACL 20-0.9 MG/50ML-% IV SOLN
INTRAVENOUS | Status: AC
  Filled 2014-03-24: qty 50

## 2014-03-24 MED ORDER — ACETAMINOPHEN 325 MG PO TABS
ORAL_TABLET | ORAL | Status: AC
  Filled 2014-03-24: qty 2

## 2014-03-24 MED ORDER — DIPHENHYDRAMINE HCL 25 MG PO CAPS
ORAL_CAPSULE | ORAL | Status: AC
  Filled 2014-03-24: qty 2

## 2014-03-24 MED ORDER — DIPHENHYDRAMINE HCL 25 MG PO CAPS
50.0000 mg | ORAL_CAPSULE | Freq: Once | ORAL | Status: AC
  Administered 2014-03-24: 50 mg via ORAL

## 2014-03-24 MED ORDER — FAMOTIDINE IN NACL 20-0.9 MG/50ML-% IV SOLN
20.0000 mg | Freq: Once | INTRAVENOUS | Status: AC
  Administered 2014-03-24: 20 mg via INTRAVENOUS

## 2014-03-24 MED ORDER — SODIUM CHLORIDE 0.9 % IV SOLN
Freq: Once | INTRAVENOUS | Status: AC
  Administered 2014-03-24: 11:00:00 via INTRAVENOUS
  Filled 2014-03-24: qty 4

## 2014-03-24 MED ORDER — SODIUM CHLORIDE 0.9 % IJ SOLN
10.0000 mL | INTRAMUSCULAR | Status: DC | PRN
  Administered 2014-03-24: 10 mL
  Filled 2014-03-24: qty 10

## 2014-03-24 MED ORDER — ACETAMINOPHEN 325 MG PO TABS
650.0000 mg | ORAL_TABLET | Freq: Once | ORAL | Status: AC
  Administered 2014-03-24: 650 mg via ORAL

## 2014-03-24 NOTE — Patient Instructions (Signed)
[place] Discharge Instructions for Patients Receiving Chemotherapy  Today you received the following chemotherapy agents elotuzumab  To help prevent nausea and vomiting after your treatment, we encourage you to take your nausea medication as directed   If you develop nausea and vomiting that is not controlled by your nausea medication, call the clinic.   BELOW ARE SYMPTOMS THAT SHOULD BE REPORTED IMMEDIATELY:  *FEVER GREATER THAN 100.5 F  *CHILLS WITH OR WITHOUT FEVER  NAUSEA AND VOMITING THAT IS NOT CONTROLLED WITH YOUR NAUSEA MEDICATION  *UNUSUAL SHORTNESS OF BREATH  *UNUSUAL BRUISING OR BLEEDING  TENDERNESS IN MOUTH AND THROAT WITH OR WITHOUT PRESENCE OF ULCERS  *URINARY PROBLEMS  *BOWEL PROBLEMS  UNUSUAL RASH Items with * indicate a potential emergency and should be followed up as soon as possible.  Feel free to call the clinic you have any questions or concerns. The clinic phone number is [phone].

## 2014-03-24 NOTE — Assessment & Plan Note (Signed)
So far, he tolerated treatment well. I will continue the same treatment without dose adjustment.

## 2014-03-24 NOTE — Progress Notes (Signed)
Per office note, Dr Alvy Bimler aware of renal insufficiency.  Crt 1.7 today, consistent with previous treatment.

## 2014-03-24 NOTE — Assessment & Plan Note (Signed)
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 The patient denies recent history of bleeding such as epistaxis, hematuria or hematochezia. He is asymptomatic from the anemia. We will observe for now.  He does not require transfusion now.

## 2014-03-24 NOTE — Assessment & Plan Note (Signed)
This is chronic in nature. It is stable. Recommend observation only.

## 2014-03-24 NOTE — Progress Notes (Signed)
[place] OFFICE PROGRESS NOTE  Patient Care Team: Lucianne Lei, MD as PCP - General (Family Medicine)  SUMMARY OF ONCOLOGIC HISTORY:   Multiple myeloma   12/12/2010 Initial Diagnosis Multiple myeloma   02/16/2014 Imaging Skeletal survey showed diffuse osteopenia   03/02/2014 Bone Marrow Biopsy Accession: [id number] BM biopsy showed only 5 % plasma cells. However, the biopsy was difficult and the bone was fragmented during the procedure. Cytogenetics and FISH study is normal   03/03/2014 Procedure he has port placement   03/10/2014 -  Chemotherapy he received elotuzumab and revlimid    INTERVAL HISTORY: Please see below for problem oriented charting. He is seen today at infusion center. He is doing well on treatment. Denies any side effects. Denies worsening peripheral neuropathy. No recent infection.  REVIEW OF SYSTEMS:   Constitutional: Denies fevers, chills or abnormal weight loss Eyes: Denies blurriness of vision Ears, nose, mouth, throat, and face: Denies mucositis or sore throat Respiratory: Denies cough, dyspnea or wheezes Cardiovascular: Denies palpitation, chest discomfort or lower extremity swelling Gastrointestinal:  Denies nausea, heartburn or change in bowel habits Skin: Denies abnormal skin rashes Lymphatics: Denies new lymphadenopathy or easy bruising Neurological:Denies numbness, tingling or new weaknesses Behavioral/Psych: Mood is stable, no new changes  All other systems were reviewed with the patient and are negative.  I have reviewed the past medical history, past surgical history, social history and family history with the patient and they are unchanged from previous note.  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  MEDICATIONS:  Current Outpatient Prescriptions  Medication Sig Dispense Refill  . allopurinol (ZYLOPRIM) 100 MG tablet Take 100 mg by mouth 2 (two) times daily.     Marland Kitchen amLODipine (NORVASC) 10 MG tablet TAKE 1 TABLET BY  MOUTH EVERY DAY 30 tablet 1  . aspirin EC 81 MG tablet Take 81 mg by mouth daily.    Marland Kitchen atorvastatin (LIPITOR) 20 MG tablet Take 20 mg by mouth at bedtime.     . cholecalciferol (VITAMIN D) 1000 UNITS tablet Take 1,000 Units by mouth daily.    . dorzolamide-timolol (COSOPT) 22.3-6.8 MG/ML ophthalmic solution     . hydrochlorothiazide (HYDRODIURIL) 25 MG tablet TAKE 1 TABLET BY MOUTH EVERY DAY 90 tablet 4  . KLOR-CON M20 20 MEQ tablet TAKE 1 TABLET BY MOUTH EVERY DAY 90 tablet 1  . lenalidomide (REVLIMID) 5 MG capsule Take 1 capsule (5 mg total) by mouth daily. Take 1 capsule daily for 21 days and then rest 7 days 21 capsule 0  . lidocaine-prilocaine (EMLA) cream Apply 1 application topically as needed. 30 g 0  . loperamide (IMODIUM A-D) 2 MG tablet 1 po after each watery BM.  Maximum 6 per 24hrs. 30 tablet PRN  . ondansetron (ZOFRAN) 8 MG tablet Take 1 tablet (8 mg total) by mouth 3 (three) times daily as needed for nausea or vomiting. 30 tablet 1  . PRESCRIPTION MEDICATION [facility] Antibody Plan     No current facility-administered medications for this visit.   Facility-Administered Medications Ordered in Other Visits  Medication Dose Route Frequency Provider Last Rate Last Dose  . heparin lock flush 100 unit/mL  500 Units Intracatheter Once PRN Heath Lark, MD      . sodium chloride 0.9 % injection 10 mL  10 mL Intracatheter PRN Heath Lark, MD        PHYSICAL EXAMINATION: ECOG PERFORMANCE STATUS: 0 - Asymptomatic GENERAL:alert, no distress and comfortable SKIN: skin color, texture, turgor are normal, no rashes  or significant lesions EYES: normal, Conjunctiva are pink and non-injected, sclera clear OROPHARYNX:no exudate, no erythema and lips, buccal mucosa, and tongue normal  NECK: supple, thyroid normal size, non-tender, without nodularity LYMPH:  no palpable lymphadenopathy in the cervical, axillary or inguinal LUNGS: clear to auscultation and percussion with normal breathing effort HEART:  regular rate & rhythm and no murmurs and no lower extremity edema ABDOMEN:abdomen soft, non-tender and normal bowel sounds Musculoskeletal:no cyanosis of digits and no clubbing  NEURO: alert & oriented x 3 with fluent speech, no focal motor/sensory deficits  LABORATORY DATA:  I have reviewed the data as listed    Component Value Date/Time   NA 136 03/24/2014 0938   NA 132* 04/02/2012 1705   K 4.2 03/24/2014 0938   K 4.0 04/02/2012 1705   CL 107 06/29/2012 1131   CL 92* 04/02/2012 1705   CO2 22 03/24/2014 0938   CO2 29 04/02/2012 1705   GLUCOSE 89 03/24/2014 0938   GLUCOSE 143* 06/29/2012 1131   GLUCOSE 105* 04/02/2012 1705   BUN 19.8 03/24/2014 0938   BUN 21 04/02/2012 1705   CREATININE 1.7* 03/24/2014 0938   CREATININE 1.89* 04/02/2012 1705   CREATININE 2.03* 12/14/2010 1114   CALCIUM 8.8 03/24/2014 0938   CALCIUM 8.0* 04/02/2012 1705   PROT 7.0 03/24/2014 0938   PROT 4.9* 08/05/2011 1100   ALBUMIN 3.2* 03/24/2014 0938   ALBUMIN 2.4* 08/05/2011 1100   AST 15 03/24/2014 0938   AST 31 08/05/2011 1100   ALT 21 03/24/2014 0938   ALT 45 08/05/2011 1100   ALKPHOS 104 03/24/2014 0938   ALKPHOS 182* 08/05/2011 1100   BILITOT 0.55 03/24/2014 0938   BILITOT 0.5 08/05/2011 1100   GFRNONAA 84* 07/28/2011 0341   GFRAA >90 07/28/2011 0341    No results found for: SPEP, UPEP  Lab Results  Component Value Date   WBC 11.7* 03/24/2014   NEUTROABS 8.0* 03/24/2014   HGB 10.2* 03/24/2014   HCT 33.3* 03/24/2014   MCV 91.1 03/24/2014   PLT 358 03/24/2014      Chemistry      Component Value Date/Time   NA 136 03/24/2014 0938   NA 132* 04/02/2012 1705   K 4.2 03/24/2014 0938   K 4.0 04/02/2012 1705   CL 107 06/29/2012 1131   CL 92* 04/02/2012 1705   CO2 22 03/24/2014 0938   CO2 29 04/02/2012 1705   BUN 19.8 03/24/2014 0938   BUN 21 04/02/2012 1705   CREATININE 1.7* 03/24/2014 0938   CREATININE 1.89* 04/02/2012 1705   CREATININE 2.03* 12/14/2010 1114      Component  Value Date/Time   CALCIUM 8.8 03/24/2014 0938   CALCIUM 8.0* 04/02/2012 1705   ALKPHOS 104 03/24/2014 0938   ALKPHOS 182* 08/05/2011 1100   AST 15 03/24/2014 0938   AST 31 08/05/2011 1100   ALT 21 03/24/2014 0938   ALT 45 08/05/2011 1100   BILITOT 0.55 03/24/2014 0938   BILITOT 0.5 08/05/2011 1100     ASSESSMENT & PLAN:  Multiple myeloma So far, he tolerated treatment well. I will continue the same treatment without dose adjustment.   Anemia in chronic renal disease This is likely anemia of chronic disease from renal failure and relapsed myeloma. The patient denies recent history of bleeding such as epistaxis, hematuria or hematochezia. He is asymptomatic from the anemia. We will observe for now.  He does not require transfusion now.    Chronic renal insufficiency, stage III (moderate) This is chronic in  nature. It is stable. Recommend observation only.   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 For this reason, I would not recommend Velcade. Continue close observation.     Orders Placed This Encounter  Procedures  . SPEP & IFE with QIG    Standing Status: Future     Number of Occurrences:      Standing Expiration Date: 04/28/2015  . Kappa/lambda light chains    Standing Status: Future     Number of Occurrences:      Standing Expiration Date: 04/28/2015  . Beta 2 microglobulin, serum    Standing Status: Future     Number of Occurrences:      Standing Expiration Date: 04/28/2015   All questions were answered. The patient knows to call the clinic with any problems, questions or concerns. No barriers to learning was detected. I spent 25 minutes counseling the patient face to face. The total time spent in the appointment was 30 minutes and more than 50% was on counseling and review of test results     [facility], NI, MD 03/24/2014 1:30 PM

## 2014-03-24 NOTE — Telephone Encounter (Signed)
added appts per pof....pt will get sched in tx.

## 2014-03-24 NOTE — Assessment & Plan Note (Signed)
He has moderate peripheral neuropathy which is suspect is related to side effects of prior treatment. For this reason, I would not recommend Velcade. Continue close observation.

## 2014-03-24 NOTE — Patient Instructions (Signed)

## 2014-03-29 ENCOUNTER — Other Ambulatory Visit: Payer: Self-pay | Admitting: *Deleted

## 2014-03-29 MED ORDER — LENALIDOMIDE 5 MG PO CAPS: 5.0000 mg | ORAL_CAPSULE | Freq: Every day | ORAL | Status: DC

## 2014-03-29 NOTE — Telephone Encounter (Signed)
THIS REFILL REQUEST FOR REVLIMID WAS PLACED ON DR.GORSUCH'S DESK. 

## 2014-03-31 ENCOUNTER — Ambulatory Visit (HOSPITAL_BASED_OUTPATIENT_CLINIC_OR_DEPARTMENT_OTHER): Payer: Medicare PPO

## 2014-03-31 ENCOUNTER — Other Ambulatory Visit (HOSPITAL_BASED_OUTPATIENT_CLINIC_OR_DEPARTMENT_OTHER): Payer: Medicare PPO

## 2014-03-31 ENCOUNTER — Ambulatory Visit: Payer: Medicare PPO

## 2014-03-31 DIAGNOSIS — Z5112 Encounter for antineoplastic immunotherapy: Secondary | ICD-10-CM

## 2014-03-31 DIAGNOSIS — C9000 Multiple myeloma not having achieved remission: Secondary | ICD-10-CM

## 2014-03-31 DIAGNOSIS — Z95828 Presence of other vascular implants and grafts: Secondary | ICD-10-CM

## 2014-03-31 LAB — COMPREHENSIVE METABOLIC PANEL (CC13)
ALK PHOS: 88 U/L (ref 40–150)
ALT: 15 U/L (ref 0–55)
AST: 13 U/L (ref 5–34)
Albumin: 3 g/dL — ABNORMAL LOW (ref 3.5–5.0)
Anion Gap: 8 mEq/L (ref 3–11)
BILIRUBIN TOTAL: 0.62 mg/dL (ref 0.20–1.20)
BUN: 22.7 mg/dL (ref 7.0–26.0)
CO2: 23 mEq/L (ref 22–29)
Calcium: 8.9 mg/dL (ref 8.4–10.4)
Chloride: 105 mEq/L (ref 98–109)
Creatinine: 1.7 mg/dL — ABNORMAL HIGH (ref 0.7–1.3)
EGFR: 45 mL/min/{1.73_m2} — ABNORMAL LOW (ref >90)
Glucose: 106 mg/dl (ref 70–140)
Potassium: 3.9 mEq/L (ref 3.5–5.1)
SODIUM: 137 meq/L (ref 136–145)
TOTAL PROTEIN: 6.7 g/dL (ref 6.4–8.3)

## 2014-03-31 LAB — CBC WITH DIFFERENTIAL/PLATELET
BASO%: 0.8 % (ref 0.0–2.0)
BASOS ABS: 0.1 10*3/uL (ref 0.0–0.1)
EOS ABS: 0.4 10*3/uL (ref 0.0–0.5)
EOS%: 5.6 % (ref 0.0–7.0)
HEMATOCRIT: 30.6 % — AB (ref 38.4–49.9)
HEMOGLOBIN: 9.8 g/dL — AB (ref 13.0–17.1)
LYMPH#: 1.5 10*3/uL (ref 0.9–3.3)
LYMPH%: 18.6 % (ref 14.0–49.0)
MCH: 28.5 pg (ref 27.2–33.4)
MCHC: 32 g/dL (ref 32.0–36.0)
MCV: 89.3 fL (ref 79.3–98.0)
MONO#: 0.9 10*3/uL (ref 0.1–0.9)
MONO%: 11 % (ref 0.0–14.0)
NEUT%: 64 % (ref 39.0–75.0)
NEUTROS ABS: 5.1 10*3/uL (ref 1.5–6.5)
Platelets: 326 10*3/uL (ref 140–400)
RBC: 3.43 10*6/uL — ABNORMAL LOW (ref 4.20–5.82)
RDW: 15.5 % — AB (ref 11.0–14.6)
WBC: 7.9 10*3/uL (ref 4.0–10.3)

## 2014-03-31 MED ORDER — FAMOTIDINE IN NACL 20-0.9 MG/50ML-% IV SOLN
INTRAVENOUS | Status: AC
  Filled 2014-03-31: qty 50

## 2014-03-31 MED ORDER — SODIUM CHLORIDE 0.9 % IJ SOLN
10.0000 mL | INTRAMUSCULAR | Status: DC | PRN
  Administered 2014-03-31: 10 mL
  Filled 2014-03-31: qty 10

## 2014-03-31 MED ORDER — SODIUM CHLORIDE 0.9 % IV SOLN
9.8000 mg/kg | Freq: Once | INTRAVENOUS | Status: AC
  Administered 2014-03-31: 800 mg via INTRAVENOUS
  Filled 2014-03-31: qty 32

## 2014-03-31 MED ORDER — SODIUM CHLORIDE 0.9 % IV SOLN
Freq: Once | INTRAVENOUS | Status: AC
  Administered 2014-03-31: 13:00:00 via INTRAVENOUS

## 2014-03-31 MED ORDER — HEPARIN SOD (PORK) LOCK FLUSH 100 UNIT/ML IV SOLN
500.0000 [IU] | Freq: Once | INTRAVENOUS | Status: AC | PRN
  Administered 2014-03-31: 500 [IU]
  Filled 2014-03-31: qty 5

## 2014-03-31 MED ORDER — SODIUM CHLORIDE 0.9 % IJ SOLN
10.0000 mL | INTRAMUSCULAR | Status: DC | PRN
  Administered 2014-03-31: 10 mL via INTRAVENOUS
  Filled 2014-03-31: qty 10

## 2014-03-31 MED ORDER — DIPHENHYDRAMINE HCL 25 MG PO CAPS
50.0000 mg | ORAL_CAPSULE | Freq: Once | ORAL | Status: AC
  Administered 2014-03-31: 50 mg via ORAL

## 2014-03-31 MED ORDER — ACETAMINOPHEN 325 MG PO TABS
650.0000 mg | ORAL_TABLET | Freq: Once | ORAL | Status: AC
  Administered 2014-03-31: 650 mg via ORAL

## 2014-03-31 MED ORDER — FAMOTIDINE IN NACL 20-0.9 MG/50ML-% IV SOLN
20.0000 mg | Freq: Once | INTRAVENOUS | Status: AC
  Administered 2014-03-31: 20 mg via INTRAVENOUS

## 2014-03-31 MED ORDER — SODIUM CHLORIDE 0.9 % IV SOLN
Freq: Once | INTRAVENOUS | Status: AC
  Administered 2014-03-31: 13:00:00 via INTRAVENOUS
  Filled 2014-03-31: qty 4

## 2014-03-31 MED ORDER — DIPHENHYDRAMINE HCL 25 MG PO CAPS
ORAL_CAPSULE | ORAL | Status: AC
  Filled 2014-03-31: qty 2

## 2014-03-31 MED ORDER — ACETAMINOPHEN 325 MG PO TABS
ORAL_TABLET | ORAL | Status: AC
  Filled 2014-03-31: qty 2

## 2014-03-31 NOTE — Patient Instructions (Signed)

## 2014-03-31 NOTE — Patient Instructions (Signed)
[facility] Discharge Instructions for Patients Receiving Chemotherapy  Today you received the following chemotherapy agents Elotuzumab.  To help prevent nausea and vomiting after your treatment, we encourage you to take your nausea medication as prescribed.   If you develop nausea and vomiting that is not controlled by your nausea medication, call the clinic.   BELOW ARE SYMPTOMS THAT SHOULD BE REPORTED IMMEDIATELY:  *FEVER GREATER THAN 100.5 F  *CHILLS WITH OR WITHOUT FEVER  NAUSEA AND VOMITING THAT IS NOT CONTROLLED WITH YOUR NAUSEA MEDICATION  *UNUSUAL SHORTNESS OF BREATH  *UNUSUAL BRUISING OR BLEEDING  TENDERNESS IN MOUTH AND THROAT WITH OR WITHOUT PRESENCE OF ULCERS  *URINARY PROBLEMS  *BOWEL PROBLEMS  UNUSUAL RASH Items with * indicate a potential emergency and should be followed up as soon as possible.  Feel free to call the clinic you have any questions or concerns. The clinic phone number is [phone].  Please show the [place] at check-in to the Emergency Department and triage nurse.

## 2014-04-07 ENCOUNTER — Ambulatory Visit: Payer: Medicare PPO

## 2014-04-07 ENCOUNTER — Ambulatory Visit (HOSPITAL_BASED_OUTPATIENT_CLINIC_OR_DEPARTMENT_OTHER): Payer: Medicare PPO

## 2014-04-07 ENCOUNTER — Encounter: Payer: Self-pay | Admitting: Nutrition

## 2014-04-07 ENCOUNTER — Other Ambulatory Visit (HOSPITAL_BASED_OUTPATIENT_CLINIC_OR_DEPARTMENT_OTHER): Payer: Medicare PPO

## 2014-04-07 ENCOUNTER — Other Ambulatory Visit: Payer: Self-pay | Admitting: Hematology and Oncology

## 2014-04-07 DIAGNOSIS — Z95828 Presence of other vascular implants and grafts: Secondary | ICD-10-CM

## 2014-04-07 DIAGNOSIS — C9000 Multiple myeloma not having achieved remission: Secondary | ICD-10-CM

## 2014-04-07 DIAGNOSIS — Z5112 Encounter for antineoplastic immunotherapy: Secondary | ICD-10-CM

## 2014-04-07 LAB — COMPREHENSIVE METABOLIC PANEL (CC13)
ALT: 23 U/L (ref 0–55)
AST: 19 U/L (ref 5–34)
Albumin: 3 g/dL — ABNORMAL LOW (ref 3.5–5.0)
Alkaline Phosphatase: 106 U/L (ref 40–150)
Anion Gap: 11 mEq/L (ref 3–11)
BUN: 25.7 mg/dL (ref 7.0–26.0)
CO2: 22 meq/L (ref 22–29)
Calcium: 9.1 mg/dL (ref 8.4–10.4)
Chloride: 104 mEq/L (ref 98–109)
Creatinine: 1.8 mg/dL — ABNORMAL HIGH (ref 0.7–1.3)
EGFR: 44 mL/min/{1.73_m2} — ABNORMAL LOW (ref >90)
GLUCOSE: 86 mg/dL (ref 70–140)
POTASSIUM: 4.4 meq/L (ref 3.5–5.1)
SODIUM: 136 meq/L (ref 136–145)
TOTAL PROTEIN: 7 g/dL (ref 6.4–8.3)
Total Bilirubin: 0.73 mg/dL (ref 0.20–1.20)

## 2014-04-07 LAB — CBC WITH DIFFERENTIAL/PLATELET
BASO%: 0.7 % (ref 0.0–2.0)
BASOS ABS: 0.1 10*3/uL (ref 0.0–0.1)
EOS ABS: 0.1 10*3/uL (ref 0.0–0.5)
EOS%: 1.2 % (ref 0.0–7.0)
HEMATOCRIT: 31.6 % — AB (ref 38.4–49.9)
HEMOGLOBIN: 10.5 g/dL — AB (ref 13.0–17.1)
LYMPH#: 2.2 10*3/uL (ref 0.9–3.3)
LYMPH%: 20.8 % (ref 14.0–49.0)
MCH: 29.2 pg (ref 27.2–33.4)
MCHC: 33.2 g/dL (ref 32.0–36.0)
MCV: 88 fL (ref 79.3–98.0)
MONO#: 0.8 10*3/uL (ref 0.1–0.9)
MONO%: 7.7 % (ref 0.0–14.0)
NEUT%: 69.6 % (ref 39.0–75.0)
NEUTROS ABS: 7.4 10*3/uL — AB (ref 1.5–6.5)
Platelets: 357 10*3/uL (ref 140–400)
RBC: 3.59 10*6/uL — ABNORMAL LOW (ref 4.20–5.82)
RDW: 14 % (ref 11.0–14.6)
WBC: 10.6 10*3/uL — ABNORMAL HIGH (ref 4.0–10.3)

## 2014-04-07 MED ORDER — FAMOTIDINE IN NACL 20-0.9 MG/50ML-% IV SOLN
20.0000 mg | Freq: Once | INTRAVENOUS | Status: AC
  Administered 2014-04-07: 20 mg via INTRAVENOUS

## 2014-04-07 MED ORDER — SODIUM CHLORIDE 0.9 % IJ SOLN
10.0000 mL | INTRAMUSCULAR | Status: DC | PRN
  Administered 2014-04-07: 10 mL via INTRAVENOUS
  Filled 2014-04-07: qty 10

## 2014-04-07 MED ORDER — ACETAMINOPHEN 325 MG PO TABS
ORAL_TABLET | ORAL | Status: AC
  Filled 2014-04-07: qty 2

## 2014-04-07 MED ORDER — DIPHENHYDRAMINE HCL 25 MG PO CAPS
50.0000 mg | ORAL_CAPSULE | Freq: Once | ORAL | Status: AC
  Administered 2014-04-07: 50 mg via ORAL

## 2014-04-07 MED ORDER — FAMOTIDINE IN NACL 20-0.9 MG/50ML-% IV SOLN
INTRAVENOUS | Status: AC
  Filled 2014-04-07: qty 50

## 2014-04-07 MED ORDER — SODIUM CHLORIDE 0.9 % IJ SOLN
10.0000 mL | INTRAMUSCULAR | Status: DC | PRN
  Administered 2014-04-07: 10 mL
  Filled 2014-04-07: qty 10

## 2014-04-07 MED ORDER — SODIUM CHLORIDE 0.9 % IV SOLN
Freq: Once | INTRAVENOUS | Status: AC
  Administered 2014-04-07: 09:00:00 via INTRAVENOUS
  Filled 2014-04-07: qty 4

## 2014-04-07 MED ORDER — DIPHENHYDRAMINE HCL 25 MG PO CAPS
ORAL_CAPSULE | ORAL | Status: AC
  Filled 2014-04-07: qty 2

## 2014-04-07 MED ORDER — HEPARIN SOD (PORK) LOCK FLUSH 100 UNIT/ML IV SOLN
500.0000 [IU] | Freq: Once | INTRAVENOUS | Status: AC | PRN
  Administered 2014-04-07: 500 [IU]
  Filled 2014-04-07: qty 5

## 2014-04-07 MED ORDER — ACETAMINOPHEN 325 MG PO TABS
650.0000 mg | ORAL_TABLET | Freq: Once | ORAL | Status: AC
  Administered 2014-04-07: 650 mg via ORAL

## 2014-04-07 MED ORDER — SODIUM CHLORIDE 0.9 % IV SOLN
9.9000 mg/kg | Freq: Once | INTRAVENOUS | Status: AC
  Administered 2014-04-07: 800 mg via INTRAVENOUS
  Filled 2014-04-07: qty 32

## 2014-04-07 MED ORDER — SODIUM CHLORIDE 0.9 % IV SOLN
Freq: Once | INTRAVENOUS | Status: AC
  Administered 2014-04-07: 09:00:00 via INTRAVENOUS

## 2014-04-07 NOTE — Patient Instructions (Signed)

## 2014-04-07 NOTE — Progress Notes (Signed)
Provided second complimentary case of Ensure Plus for patient. 

## 2014-04-07 NOTE — Patient Instructions (Signed)
[facility] Discharge Instructions for Patients Receiving Chemotherapy  Today you received the following chemotherapy agents: Elotuzumab.   To help prevent nausea and vomiting after your treatment, we encourage you to take your nausea medication as directed.    If you develop nausea and vomiting that is not controlled by your nausea medication, call the clinic.   BELOW ARE SYMPTOMS THAT SHOULD BE REPORTED IMMEDIATELY:  *FEVER GREATER THAN 100.5 F  *CHILLS WITH OR WITHOUT FEVER  NAUSEA AND VOMITING THAT IS NOT CONTROLLED WITH YOUR NAUSEA MEDICATION  *UNUSUAL SHORTNESS OF BREATH  *UNUSUAL BRUISING OR BLEEDING  TENDERNESS IN MOUTH AND THROAT WITH OR WITHOUT PRESENCE OF ULCERS  *URINARY PROBLEMS  *BOWEL PROBLEMS  UNUSUAL RASH Items with * indicate a potential emergency and should be followed up as soon as possible.  Feel free to call the clinic you have any questions or concerns. The clinic phone number is [phone].  Please show the [place] at check-in to the Emergency Department and triage nurse.

## 2014-04-07 NOTE — Progress Notes (Signed)
0900: Per Dr. Alvy Bimler okay to proceed with treatment today without waiting for CMET results.   1030: Pt and wife state they gave Dr. Alvy Bimler some paperwork to fill out for them a couple weeks ago and it has not been faxed. Message left with Cameo, RN regarding this.

## 2014-04-11 LAB — KAPPA/LAMBDA LIGHT CHAINS
KAPPA FREE LGHT CHN: 8.64 mg/dL — AB (ref 0.33–1.94)
Kappa:Lambda Ratio: 6.13 — ABNORMAL HIGH (ref 0.26–1.65)
Lambda Free Lght Chn: 1.41 mg/dL (ref 0.57–2.63)

## 2014-04-11 LAB — SPEP & IFE WITH QIG
Abnormal Protein Band1: 0.9 g/dL
Albumin ELP: 3.3 g/dL — ABNORMAL LOW (ref 3.8–4.8)
Alpha-1-Globulin: 0.5 g/dL — ABNORMAL HIGH (ref 0.2–0.3)
Alpha-2-Globulin: 0.9 g/dL (ref 0.5–0.9)
Beta 2: 0.4 g/dL (ref 0.2–0.5)
Beta Globulin: 0.5 g/dL (ref 0.4–0.6)
Gamma Globulin: 1.5 g/dL (ref 0.8–1.7)
IgA: 74 mg/dL (ref 68–379)
IgG (Immunoglobin G), Serum: 1620 mg/dL — ABNORMAL HIGH (ref 650–1600)
IgM, Serum: 29 mg/dL — ABNORMAL LOW (ref 41–251)
Total Protein, Serum Electrophoresis: 7 g/dL (ref 6.1–8.1)

## 2014-04-11 LAB — BETA 2 MICROGLOBULIN, SERUM: BETA 2 MICROGLOBULIN: 4.91 mg/L — AB (ref <=2.51)

## 2014-04-14 ENCOUNTER — Other Ambulatory Visit: Payer: Medicare PPO

## 2014-04-14 ENCOUNTER — Ambulatory Visit: Payer: Medicare PPO

## 2014-04-14 ENCOUNTER — Other Ambulatory Visit (HOSPITAL_BASED_OUTPATIENT_CLINIC_OR_DEPARTMENT_OTHER): Payer: Medicare PPO

## 2014-04-14 ENCOUNTER — Ambulatory Visit (HOSPITAL_BASED_OUTPATIENT_CLINIC_OR_DEPARTMENT_OTHER): Payer: Medicare PPO

## 2014-04-14 ENCOUNTER — Telehealth: Payer: Self-pay | Admitting: *Deleted

## 2014-04-14 ENCOUNTER — Ambulatory Visit (HOSPITAL_BASED_OUTPATIENT_CLINIC_OR_DEPARTMENT_OTHER): Payer: Medicare PPO | Admitting: Hematology and Oncology

## 2014-04-14 ENCOUNTER — Telehealth: Payer: Self-pay | Admitting: Hematology and Oncology

## 2014-04-14 ENCOUNTER — Encounter: Payer: Self-pay | Admitting: Hematology and Oncology

## 2014-04-14 VITALS — BP 131/55 | HR 62 | Temp 98.6°F | Resp 18 | Ht 71.0 in | Wt 176.1 lb

## 2014-04-14 DIAGNOSIS — C9002 Multiple myeloma in relapse: Secondary | ICD-10-CM

## 2014-04-14 DIAGNOSIS — C9000 Multiple myeloma not having achieved remission: Secondary | ICD-10-CM

## 2014-04-14 DIAGNOSIS — Z5112 Encounter for antineoplastic immunotherapy: Secondary | ICD-10-CM | POA: Diagnosis not present

## 2014-04-14 DIAGNOSIS — G620 Drug-induced polyneuropathy: Secondary | ICD-10-CM | POA: Diagnosis not present

## 2014-04-14 DIAGNOSIS — N183 Chronic kidney disease, stage 3 unspecified: Secondary | ICD-10-CM

## 2014-04-14 DIAGNOSIS — D631 Anemia in chronic kidney disease: Secondary | ICD-10-CM | POA: Diagnosis not present

## 2014-04-14 DIAGNOSIS — N189 Chronic kidney disease, unspecified: Secondary | ICD-10-CM

## 2014-04-14 DIAGNOSIS — T451X5A Adverse effect of antineoplastic and immunosuppressive drugs, initial encounter: Secondary | ICD-10-CM

## 2014-04-14 DIAGNOSIS — Z95828 Presence of other vascular implants and grafts: Secondary | ICD-10-CM

## 2014-04-14 LAB — CBC WITH DIFFERENTIAL/PLATELET
BASO%: 1.2 % (ref 0.0–2.0)
Basophils Absolute: 0.1 10*3/uL (ref 0.0–0.1)
EOS ABS: 0.5 10*3/uL (ref 0.0–0.5)
EOS%: 5.3 % (ref 0.0–7.0)
HEMATOCRIT: 31.2 % — AB (ref 38.4–49.9)
HGB: 10.3 g/dL — ABNORMAL LOW (ref 13.0–17.1)
LYMPH%: 17.2 % (ref 14.0–49.0)
MCH: 29 pg (ref 27.2–33.4)
MCHC: 33 g/dL (ref 32.0–36.0)
MCV: 87.9 fL (ref 79.3–98.0)
MONO#: 0.6 10*3/uL (ref 0.1–0.9)
MONO%: 6.2 % (ref 0.0–14.0)
NEUT%: 70.1 % (ref 39.0–75.0)
NEUTROS ABS: 6.3 10*3/uL (ref 1.5–6.5)
PLATELETS: 325 10*3/uL (ref 140–400)
RBC: 3.55 10*6/uL — ABNORMAL LOW (ref 4.20–5.82)
RDW: 14.5 % (ref 11.0–14.6)
WBC: 9 10*3/uL (ref 4.0–10.3)
lymph#: 1.6 10*3/uL (ref 0.9–3.3)

## 2014-04-14 LAB — COMPREHENSIVE METABOLIC PANEL (CC13)
ALBUMIN: 3.1 g/dL — AB (ref 3.5–5.0)
ALK PHOS: 102 U/L (ref 40–150)
ALT: 24 U/L (ref 0–55)
AST: 18 U/L (ref 5–34)
Anion Gap: 8 mEq/L (ref 3–11)
BUN: 22 mg/dL (ref 7.0–26.0)
CALCIUM: 8.8 mg/dL (ref 8.4–10.4)
CHLORIDE: 106 meq/L (ref 98–109)
CO2: 22 mEq/L (ref 22–29)
Creatinine: 1.8 mg/dL — ABNORMAL HIGH (ref 0.7–1.3)
EGFR: 43 mL/min/{1.73_m2} — ABNORMAL LOW (ref >90)
GLUCOSE: 129 mg/dL (ref 70–140)
Potassium: 4 mEq/L (ref 3.5–5.1)
SODIUM: 136 meq/L (ref 136–145)
TOTAL PROTEIN: 6.7 g/dL (ref 6.4–8.3)
Total Bilirubin: 0.37 mg/dL (ref 0.20–1.20)

## 2014-04-14 MED ORDER — ACETAMINOPHEN 325 MG PO TABS
650.0000 mg | ORAL_TABLET | Freq: Once | ORAL | Status: AC
  Administered 2014-04-14: 650 mg via ORAL

## 2014-04-14 MED ORDER — SODIUM CHLORIDE 0.9 % IJ SOLN
10.0000 mL | INTRAMUSCULAR | Status: DC | PRN
  Administered 2014-04-14: 10 mL via INTRAVENOUS
  Filled 2014-04-14: qty 10

## 2014-04-14 MED ORDER — SODIUM CHLORIDE 0.9 % IV SOLN
Freq: Once | INTRAVENOUS | Status: AC
  Administered 2014-04-14: 13:00:00 via INTRAVENOUS
  Filled 2014-04-14: qty 4

## 2014-04-14 MED ORDER — FAMOTIDINE IN NACL 20-0.9 MG/50ML-% IV SOLN
INTRAVENOUS | Status: AC
  Filled 2014-04-14: qty 50

## 2014-04-14 MED ORDER — ACETAMINOPHEN 325 MG PO TABS
ORAL_TABLET | ORAL | Status: AC
  Filled 2014-04-14: qty 2

## 2014-04-14 MED ORDER — SODIUM CHLORIDE 0.9 % IJ SOLN
10.0000 mL | INTRAMUSCULAR | Status: DC | PRN
  Administered 2014-04-14: 10 mL
  Filled 2014-04-14: qty 10

## 2014-04-14 MED ORDER — FAMOTIDINE IN NACL 20-0.9 MG/50ML-% IV SOLN
20.0000 mg | Freq: Once | INTRAVENOUS | Status: AC
  Administered 2014-04-14: 20 mg via INTRAVENOUS

## 2014-04-14 MED ORDER — SODIUM CHLORIDE 0.9 % IV SOLN
Freq: Once | INTRAVENOUS | Status: AC
  Administered 2014-04-14: 12:00:00 via INTRAVENOUS

## 2014-04-14 MED ORDER — HEPARIN SOD (PORK) LOCK FLUSH 100 UNIT/ML IV SOLN
500.0000 [IU] | Freq: Once | INTRAVENOUS | Status: AC | PRN
  Administered 2014-04-14: 500 [IU]
  Filled 2014-04-14: qty 5

## 2014-04-14 MED ORDER — SODIUM CHLORIDE 0.9 % IV SOLN
9.8000 mg/kg | Freq: Once | INTRAVENOUS | Status: AC
  Administered 2014-04-14: 800 mg via INTRAVENOUS
  Filled 2014-04-14: qty 32

## 2014-04-14 MED ORDER — DIPHENHYDRAMINE HCL 25 MG PO CAPS
50.0000 mg | ORAL_CAPSULE | Freq: Once | ORAL | Status: AC
  Administered 2014-04-14: 50 mg via ORAL

## 2014-04-14 MED ORDER — DIPHENHYDRAMINE HCL 25 MG PO CAPS
ORAL_CAPSULE | ORAL | Status: AC
  Filled 2014-04-14: qty 1

## 2014-04-14 NOTE — Telephone Encounter (Signed)
S/w [facility].  They state pt qualified for assistance for Revlmid and has no co pay.  They delivered last rx Revlimid to him on 04/01/14.

## 2014-04-14 NOTE — Assessment & Plan Note (Addendum)
So far, he tolerated treatment well. I will continue the same treatment without dose adjustment. Recent repeat blood work show minimum response however, he was only treated for 1 month I would recheck again serum protein electrophoresis and light chain end of the month and I will discuss treatment response with him next month when I see him back. I have not started Zometa as she has not obtained dental clearance. He say he has an appointment pending.

## 2014-04-14 NOTE — Telephone Encounter (Signed)
gave and pinted appt sched and avs for pt for April and May....sed added tx.

## 2014-04-14 NOTE — Patient Instructions (Signed)

## 2014-04-14 NOTE — Assessment & Plan Note (Signed)
He has moderate peripheral neuropathy which is suspect is related to side effects of prior treatment. For this reason, I would not recommend Velcade. Continue close observation.

## 2014-04-14 NOTE — Progress Notes (Signed)
[facility] OFFICE PROGRESS NOTE  Patient Care Team: Lucianne Lei, MD as PCP - General (Family Medicine)  SUMMARY OF ONCOLOGIC HISTORY:   Multiple myeloma   12/12/2010 Initial Diagnosis Multiple myeloma   02/16/2014 Imaging Skeletal survey showed diffuse osteopenia   03/02/2014 Bone Marrow Biopsy Accession: [id number] BM biopsy showed only 5 % plasma cells. However, the biopsy was difficult and the bone was fragmented during the procedure. Cytogenetics and FISH study is normal   03/03/2014 Procedure he has port placement   03/10/2014 -  Chemotherapy he received elotuzumab and revlimid    INTERVAL HISTORY: Please see below for problem oriented charting. He returns today for further follow-up. He tolerated treatment well so far without any major side effects.  REVIEW OF SYSTEMS:   Constitutional: Denies fevers, chills or abnormal weight loss Eyes: Denies blurriness of vision Ears, nose, mouth, throat, and face: Denies mucositis or sore throat Respiratory: Denies cough, dyspnea or wheezes Cardiovascular: Denies palpitation, chest discomfort or lower extremity swelling Gastrointestinal:  Denies nausea, heartburn or change in bowel habits Skin: Denies abnormal skin rashes Lymphatics: Denies new lymphadenopathy or easy bruising Neurological:Denies numbness, tingling or new weaknesses Behavioral/Psych: Mood is stable, no new changes  All other systems were reviewed with the patient and are negative.  I have reviewed the past medical history, past surgical history, social history and family history with the patient and they are unchanged from previous note.  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  MEDICATIONS:  Current Outpatient Prescriptions  Medication Sig Dispense Refill  . allopurinol (ZYLOPRIM) 100 MG tablet Take 100 mg by mouth 2 (two) times daily.     Marland Kitchen amLODipine (NORVASC) 10 MG tablet TAKE 1 TABLET BY MOUTH EVERY DAY 30 tablet 1  . aspirin EC 81 MG  tablet Take 81 mg by mouth daily.    Marland Kitchen atorvastatin (LIPITOR) 20 MG tablet Take 20 mg by mouth at bedtime.     . cholecalciferol (VITAMIN D) 1000 UNITS tablet Take 1,000 Units by mouth daily.    . dorzolamide-timolol (COSOPT) 22.3-6.8 MG/ML ophthalmic solution     . hydrochlorothiazide (HYDRODIURIL) 25 MG tablet TAKE 1 TABLET BY MOUTH EVERY DAY 90 tablet 4  . KLOR-CON M20 20 MEQ tablet TAKE 1 TABLET BY MOUTH EVERY DAY 90 tablet 1  . lenalidomide (REVLIMID) 5 MG capsule Take 1 capsule (5 mg total) by mouth daily. Take 1 capsule daily for 21 days and then rest 7 days 21 capsule 0  . lidocaine-prilocaine (EMLA) cream Apply 1 application topically as needed. 30 g 0  . loperamide (IMODIUM A-D) 2 MG tablet 1 po after each watery BM.  Maximum 6 per 24hrs. 30 tablet PRN  . ondansetron (ZOFRAN) 8 MG tablet Take 1 tablet (8 mg total) by mouth 3 (three) times daily as needed for nausea or vomiting. 30 tablet 1  . PRESCRIPTION MEDICATION [facility] Antibody Plan     No current facility-administered medications for this visit.   Facility-Administered Medications Ordered in Other Visits  Medication Dose Route Frequency Provider Last Rate Last Dose  . elotuzumab (EMPLICITI) 270 mg in sodium chloride 0.9 % 230 mL chemo infusion  9.8 mg/kg (Treatment Plan Actual) Intravenous Once Heath Lark, MD      . heparin lock flush 100 unit/mL  500 Units Intracatheter Once PRN Heath Lark, MD      . sodium chloride 0.9 % injection 10 mL  10 mL Intracatheter PRN Heath Lark, MD  PHYSICAL EXAMINATION: ECOG PERFORMANCE STATUS: 0 - Asymptomatic  Filed Vitals:   04/14/14 1115  BP: 131/55  Pulse: 62  Temp: 98.6 F (37 C)  Resp: 18   Filed Weights   04/14/14 1115  Weight: 176 lb 1.6 oz (79.878 kg)    GENERAL:alert, no distress and comfortable SKIN: skin color, texture, turgor are normal, no rashes or significant lesions EYES: normal, Conjunctiva are pink and non-injected, sclera clear OROPHARYNX:no exudate, no  erythema and lips, buccal mucosa, and tongue normal  NECK: supple, thyroid normal size, non-tender, without nodularity LYMPH:  no palpable lymphadenopathy in the cervical, axillary or inguinal LUNGS: clear to auscultation and percussion with normal breathing effort HEART: regular rate & rhythm and no murmurs and no lower extremity edema ABDOMEN:abdomen soft, non-tender and normal bowel sounds Musculoskeletal:no cyanosis of digits and no clubbing  NEURO: alert & oriented x 3 with fluent speech, no focal motor/sensory deficits  LABORATORY DATA:  I have reviewed the data as listed    Component Value Date/Time   NA 136 04/14/2014 1047   NA 132* 04/02/2012 1705   K 4.0 04/14/2014 1047   K 4.0 04/02/2012 1705   CL 107 06/29/2012 1131   CL 92* 04/02/2012 1705   CO2 22 04/14/2014 1047   CO2 29 04/02/2012 1705   GLUCOSE 129 04/14/2014 1047   GLUCOSE 143* 06/29/2012 1131   GLUCOSE 105* 04/02/2012 1705   BUN 22.0 04/14/2014 1047   BUN 21 04/02/2012 1705   CREATININE 1.8* 04/14/2014 1047   CREATININE 1.89* 04/02/2012 1705   CREATININE 2.03* 12/14/2010 1114   CALCIUM 8.8 04/14/2014 1047   CALCIUM 8.0* 04/02/2012 1705   PROT 6.7 04/14/2014 1047   PROT 4.9* 08/05/2011 1100   ALBUMIN 3.1* 04/14/2014 1047   ALBUMIN 2.4* 08/05/2011 1100   AST 18 04/14/2014 1047   AST 31 08/05/2011 1100   ALT 24 04/14/2014 1047   ALT 45 08/05/2011 1100   ALKPHOS 102 04/14/2014 1047   ALKPHOS 182* 08/05/2011 1100   BILITOT 0.37 04/14/2014 1047   BILITOT 0.5 08/05/2011 1100   GFRNONAA 84* 07/28/2011 0341   GFRAA >90 07/28/2011 0341    No results found for: SPEP, UPEP  Lab Results  Component Value Date   WBC 9.0 04/14/2014   NEUTROABS 6.3 04/14/2014   HGB 10.3* 04/14/2014   HCT 31.2* 04/14/2014   MCV 87.9 04/14/2014   PLT 325 04/14/2014      Chemistry      Component Value Date/Time   NA 136 04/14/2014 1047   NA 132* 04/02/2012 1705   K 4.0 04/14/2014 1047   K 4.0 04/02/2012 1705   CL 107  06/29/2012 1131   CL 92* 04/02/2012 1705   CO2 22 04/14/2014 1047   CO2 29 04/02/2012 1705   BUN 22.0 04/14/2014 1047   BUN 21 04/02/2012 1705   CREATININE 1.8* 04/14/2014 1047   CREATININE 1.89* 04/02/2012 1705   CREATININE 2.03* 12/14/2010 1114      Component Value Date/Time   CALCIUM 8.8 04/14/2014 1047   CALCIUM 8.0* 04/02/2012 1705   ALKPHOS 102 04/14/2014 1047   ALKPHOS 182* 08/05/2011 1100   AST 18 04/14/2014 1047   AST 31 08/05/2011 1100   ALT 24 04/14/2014 1047   ALT 45 08/05/2011 1100   BILITOT 0.37 04/14/2014 1047   BILITOT 0.5 08/05/2011 1100     ASSESSMENT & PLAN:  Multiple myeloma So far, he tolerated treatment well. I will continue the same treatment without dose adjustment. Recent  repeat blood work show minimum response however, he was only treated for 1 month I would recheck again serum protein electrophoresis and light chain end of the month and I will discuss treatment response with him next month when I see him back. I have not started Zometa as she has not obtained dental clearance. He say he has an appointment pending.   Anemia in chronic renal disease This is likely anemia of chronic disease from renal failure and relapsed myeloma. The patient denies recent history of bleeding such as epistaxis, hematuria or hematochezia. He is asymptomatic from the anemia. We will observe for now.  He does not require transfusion now.      Chronic renal insufficiency, stage III (moderate) This is chronic in nature. It is stable. Recommend observation only.     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 For this reason, I would not recommend Velcade. Continue close observation.       Orders Placed This Encounter  Procedures  . SPEP & IFE with QIG    Standing Status: Future     Number of Occurrences:      Standing Expiration Date: 05/19/2015  . Kappa/lambda  light chains    Standing Status: Future     Number of Occurrences:      Standing Expiration Date: 05/19/2015  . Beta 2 microglobulin, serum    Standing Status: Future     Number of Occurrences:      Standing Expiration Date: 05/19/2015   All questions were answered. The patient knows to call the clinic with any problems, questions or concerns. No barriers to learning was detected. I spent 25 minutes counseling the patient face to face. The total time spent in the appointment was 30 minutes and more than 50% was on counseling and review of test results     [facility], [place], MD 04/14/2014 12:55 PM

## 2014-04-14 NOTE — Assessment & Plan Note (Signed)
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 The patient denies recent history of bleeding such as epistaxis, hematuria or hematochezia. He is asymptomatic from the anemia. We will observe for now.  He does not require transfusion now.

## 2014-04-14 NOTE — Assessment & Plan Note (Signed)
This is chronic in nature. It is stable. Recommend observation only.

## 2014-04-21 ENCOUNTER — Other Ambulatory Visit (HOSPITAL_BASED_OUTPATIENT_CLINIC_OR_DEPARTMENT_OTHER): Payer: Medicare PPO

## 2014-04-21 ENCOUNTER — Other Ambulatory Visit (HOSPITAL_COMMUNITY)
Admission: AD | Admit: 2014-04-21 | Discharge: 2014-04-21 | Disposition: A | Discharge status: Home / Self Care | Payer: Medicare PPO | Source: Ambulatory Visit | Attending: Hematology and Oncology | Admitting: Hematology and Oncology

## 2014-04-21 ENCOUNTER — Ambulatory Visit (HOSPITAL_BASED_OUTPATIENT_CLINIC_OR_DEPARTMENT_OTHER): Payer: Medicare PPO

## 2014-04-21 ENCOUNTER — Ambulatory Visit: Payer: Medicare PPO

## 2014-04-21 VITALS — BP 129/62 | HR 66 | Temp 98.0°F | Resp 18

## 2014-04-21 DIAGNOSIS — C9000 Multiple myeloma not having achieved remission: Secondary | ICD-10-CM | POA: Insufficient documentation

## 2014-04-21 DIAGNOSIS — Z95828 Presence of other vascular implants and grafts: Secondary | ICD-10-CM

## 2014-04-21 DIAGNOSIS — Z5112 Encounter for antineoplastic immunotherapy: Secondary | ICD-10-CM | POA: Diagnosis not present

## 2014-04-21 LAB — CBC WITH DIFFERENTIAL/PLATELET
BASO%: 0.8 % (ref 0.0–2.0)
BASOS ABS: 0.1 10*3/uL (ref 0.0–0.1)
EOS%: 6 % (ref 0.0–7.0)
Eosinophils Absolute: 0.7 10*3/uL — ABNORMAL HIGH (ref 0.0–0.5)
HCT: 29.8 % — ABNORMAL LOW (ref 38.4–49.9)
HGB: 9.6 g/dL — ABNORMAL LOW (ref 13.0–17.1)
LYMPH#: 1.7 10*3/uL (ref 0.9–3.3)
LYMPH%: 15.4 % (ref 14.0–49.0)
MCH: 28.3 pg (ref 27.2–33.4)
MCHC: 32.3 g/dL (ref 32.0–36.0)
MCV: 87.7 fL (ref 79.3–98.0)
MONO#: 1.3 10*3/uL — ABNORMAL HIGH (ref 0.1–0.9)
MONO%: 11.6 % (ref 0.0–14.0)
NEUT#: 7.3 10*3/uL — ABNORMAL HIGH (ref 1.5–6.5)
NEUT%: 66.2 % (ref 39.0–75.0)
PLATELETS: 339 10*3/uL (ref 140–400)
RBC: 3.4 10*6/uL — ABNORMAL LOW (ref 4.20–5.82)
RDW: 15.8 % — AB (ref 11.0–14.6)
WBC: 11 10*3/uL — ABNORMAL HIGH (ref 4.0–10.3)

## 2014-04-21 LAB — COMPREHENSIVE METABOLIC PANEL
ALT: 22 U/L (ref 0–53)
ANION GAP: 7 (ref 5–15)
AST: 19 U/L (ref 0–37)
Albumin: 3.4 g/dL — ABNORMAL LOW (ref 3.5–5.2)
Alkaline Phosphatase: 84 U/L (ref 39–117)
BUN: 28 mg/dL — ABNORMAL HIGH (ref 6–23)
CALCIUM: 8.5 mg/dL (ref 8.4–10.5)
CO2: 24 mmol/L (ref 19–32)
Chloride: 102 mmol/L (ref 96–112)
Creatinine, Ser: 1.81 mg/dL — ABNORMAL HIGH (ref 0.50–1.35)
GFR calc Af Amer: 42 mL/min — ABNORMAL LOW (ref >90)
GFR calc non Af Amer: 36 mL/min — ABNORMAL LOW (ref >90)
Glucose, Bld: 76 mg/dL (ref 70–99)
Potassium: 3.7 mmol/L (ref 3.5–5.1)
Sodium: 133 mmol/L — ABNORMAL LOW (ref 135–145)
TOTAL PROTEIN: 7.1 g/dL (ref 6.0–8.3)
Total Bilirubin: 0.4 mg/dL (ref 0.3–1.2)

## 2014-04-21 MED ORDER — FAMOTIDINE IN NACL 20-0.9 MG/50ML-% IV SOLN
20.0000 mg | Freq: Once | INTRAVENOUS | Status: AC
  Administered 2014-04-21: 20 mg via INTRAVENOUS

## 2014-04-21 MED ORDER — SODIUM CHLORIDE 0.9 % IJ SOLN
10.0000 mL | INTRAMUSCULAR | Status: DC | PRN
  Administered 2014-04-21: 10 mL via INTRAVENOUS
  Filled 2014-04-21: qty 10

## 2014-04-21 MED ORDER — SODIUM CHLORIDE 0.9 % IJ SOLN
10.0000 mL | INTRAMUSCULAR | Status: DC | PRN
Start: 2014-04-21 — End: 2014-04-21
  Administered 2014-04-21: 10 mL
  Filled 2014-04-21: qty 10

## 2014-04-21 MED ORDER — SODIUM CHLORIDE 0.9 % IV SOLN
Freq: Once | INTRAVENOUS | Status: AC
  Administered 2014-04-21: 11:00:00 via INTRAVENOUS

## 2014-04-21 MED ORDER — FAMOTIDINE IN NACL 20-0.9 MG/50ML-% IV SOLN
INTRAVENOUS | Status: AC
  Filled 2014-04-21: qty 50

## 2014-04-21 MED ORDER — ELOTUZUMAB CHEMO INJECTION 400 MG
9.8000 mg/kg | Freq: Once | INTRAVENOUS | Status: AC
  Administered 2014-04-21: 800 mg via INTRAVENOUS
  Filled 2014-04-21: qty 32

## 2014-04-21 MED ORDER — HEPARIN SOD (PORK) LOCK FLUSH 100 UNIT/ML IV SOLN
500.0000 [IU] | Freq: Once | INTRAVENOUS | Status: AC | PRN
  Administered 2014-04-21: 500 [IU]
  Filled 2014-04-21: qty 5

## 2014-04-21 MED ORDER — DIPHENHYDRAMINE HCL 25 MG PO CAPS
ORAL_CAPSULE | ORAL | Status: AC
  Filled 2014-04-21: qty 2

## 2014-04-21 MED ORDER — ACETAMINOPHEN 325 MG PO TABS
ORAL_TABLET | ORAL | Status: AC
  Filled 2014-04-21: qty 2

## 2014-04-21 MED ORDER — ACETAMINOPHEN 325 MG PO TABS
650.0000 mg | ORAL_TABLET | Freq: Once | ORAL | Status: AC
  Administered 2014-04-21: 650 mg via ORAL

## 2014-04-21 MED ORDER — DIPHENHYDRAMINE HCL 25 MG PO CAPS
50.0000 mg | ORAL_CAPSULE | Freq: Once | ORAL | Status: AC
  Administered 2014-04-21: 50 mg via ORAL

## 2014-04-21 MED ORDER — SODIUM CHLORIDE 0.9 % IV SOLN
Freq: Once | INTRAVENOUS | Status: AC
  Administered 2014-04-21: 11:00:00 via INTRAVENOUS
  Filled 2014-04-21: qty 4

## 2014-04-21 NOTE — Patient Instructions (Signed)

## 2014-04-21 NOTE — Patient Instructions (Signed)
[facility] Discharge Instructions for Patients Receiving Chemotherapy  Today you received the following chemotherapy agents Elotuzumab To help prevent nausea and vomiting after your treatment, we encourage you to take your nausea medication as prescribed.  If you develop nausea and vomiting that is not controlled by your nausea medication, call the clinic.   BELOW ARE SYMPTOMS THAT SHOULD BE REPORTED IMMEDIATELY:  *FEVER GREATER THAN 100.5 F  *CHILLS WITH OR WITHOUT FEVER  NAUSEA AND VOMITING THAT IS NOT CONTROLLED WITH YOUR NAUSEA MEDICATION  *UNUSUAL SHORTNESS OF BREATH  *UNUSUAL BRUISING OR BLEEDING  TENDERNESS IN MOUTH AND THROAT WITH OR WITHOUT PRESENCE OF ULCERS  *URINARY PROBLEMS  *BOWEL PROBLEMS  UNUSUAL RASH Items with * indicate a potential emergency and should be followed up as soon as possible.  Feel free to call the clinic you have any questions or concerns. The clinic phone number is [phone].  Please show the [place] at check-in to the Emergency Department and triage nurse.

## 2014-04-27 ENCOUNTER — Other Ambulatory Visit: Payer: Self-pay | Admitting: *Deleted

## 2014-04-27 ENCOUNTER — Telehealth: Payer: Self-pay

## 2014-04-27 MED ORDER — LENALIDOMIDE 5 MG PO CAPS: 5.0000 mg | ORAL_CAPSULE | Freq: Every day | ORAL | Status: DC

## 2014-04-27 NOTE — Telephone Encounter (Signed)
revlimid refill requeat to Dr Alvy Bimler

## 2014-04-28 ENCOUNTER — Ambulatory Visit (HOSPITAL_BASED_OUTPATIENT_CLINIC_OR_DEPARTMENT_OTHER): Payer: Medicare PPO

## 2014-04-28 ENCOUNTER — Other Ambulatory Visit (HOSPITAL_BASED_OUTPATIENT_CLINIC_OR_DEPARTMENT_OTHER): Payer: Medicare PPO

## 2014-04-28 ENCOUNTER — Ambulatory Visit: Payer: Medicare PPO

## 2014-04-28 VITALS — BP 117/58 | HR 56 | Temp 97.5°F | Resp 16

## 2014-04-28 DIAGNOSIS — C9000 Multiple myeloma not having achieved remission: Secondary | ICD-10-CM

## 2014-04-28 DIAGNOSIS — Z5112 Encounter for antineoplastic immunotherapy: Secondary | ICD-10-CM | POA: Diagnosis not present

## 2014-04-28 DIAGNOSIS — Z95828 Presence of other vascular implants and grafts: Secondary | ICD-10-CM

## 2014-04-28 LAB — CBC WITH DIFFERENTIAL/PLATELET
BASO%: 1.3 % (ref 0.0–2.0)
Basophils Absolute: 0.1 10*3/uL (ref 0.0–0.1)
EOS ABS: 0.9 10*3/uL — AB (ref 0.0–0.5)
EOS%: 9.8 % — ABNORMAL HIGH (ref 0.0–7.0)
HEMATOCRIT: 31.1 % — AB (ref 38.4–49.9)
HGB: 10.1 g/dL — ABNORMAL LOW (ref 13.0–17.1)
LYMPH%: 15.4 % (ref 14.0–49.0)
MCH: 28.4 pg (ref 27.2–33.4)
MCHC: 32.3 g/dL (ref 32.0–36.0)
MCV: 88.1 fL (ref 79.3–98.0)
MONO#: 0.9 10*3/uL (ref 0.1–0.9)
MONO%: 9.5 % (ref 0.0–14.0)
NEUT%: 64 % (ref 39.0–75.0)
NEUTROS ABS: 5.8 10*3/uL (ref 1.5–6.5)
PLATELETS: 368 10*3/uL (ref 140–400)
RBC: 3.54 10*6/uL — AB (ref 4.20–5.82)
RDW: 15.7 % — ABNORMAL HIGH (ref 11.0–14.6)
WBC: 9 10*3/uL (ref 4.0–10.3)
lymph#: 1.4 10*3/uL (ref 0.9–3.3)

## 2014-04-28 LAB — COMPREHENSIVE METABOLIC PANEL (CC13)
ALBUMIN: 3 g/dL — AB (ref 3.5–5.0)
ALT: 20 U/L (ref 0–55)
AST: 22 U/L (ref 5–34)
Alkaline Phosphatase: 84 U/L (ref 40–150)
Anion Gap: 9 mEq/L (ref 3–11)
BUN: 23.2 mg/dL (ref 7.0–26.0)
CO2: 19 mEq/L — ABNORMAL LOW (ref 22–29)
Calcium: 8.7 mg/dL (ref 8.4–10.4)
Chloride: 106 mEq/L (ref 98–109)
Creatinine: 1.6 mg/dL — ABNORMAL HIGH (ref 0.7–1.3)
EGFR: 49 mL/min/{1.73_m2} — ABNORMAL LOW (ref >90)
Glucose: 82 mg/dl (ref 70–140)
Potassium: 4 mEq/L (ref 3.5–5.1)
SODIUM: 135 meq/L — AB (ref 136–145)
Total Bilirubin: 0.5 mg/dL (ref 0.20–1.20)
Total Protein: 6.7 g/dL (ref 6.4–8.3)

## 2014-04-28 MED ORDER — SODIUM CHLORIDE 0.9 % IV SOLN
Freq: Once | INTRAVENOUS | Status: AC
  Administered 2014-04-28: 11:00:00 via INTRAVENOUS
  Filled 2014-04-28: qty 4

## 2014-04-28 MED ORDER — DIPHENHYDRAMINE HCL 25 MG PO CAPS
50.0000 mg | ORAL_CAPSULE | Freq: Once | ORAL | Status: AC
  Administered 2014-04-28: 50 mg via ORAL

## 2014-04-28 MED ORDER — SODIUM CHLORIDE 0.9 % IJ SOLN
10.0000 mL | INTRAMUSCULAR | Status: DC | PRN
  Administered 2014-04-28: 10 mL
  Filled 2014-04-28: qty 10

## 2014-04-28 MED ORDER — HEPARIN SOD (PORK) LOCK FLUSH 100 UNIT/ML IV SOLN
500.0000 [IU] | Freq: Once | INTRAVENOUS | Status: AC | PRN
  Administered 2014-04-28: 500 [IU]
  Filled 2014-04-28: qty 5

## 2014-04-28 MED ORDER — ACETAMINOPHEN 325 MG PO TABS
ORAL_TABLET | ORAL | Status: AC
  Filled 2014-04-28: qty 2

## 2014-04-28 MED ORDER — ACETAMINOPHEN 325 MG PO TABS
650.0000 mg | ORAL_TABLET | Freq: Once | ORAL | Status: AC
  Administered 2014-04-28: 650 mg via ORAL

## 2014-04-28 MED ORDER — SODIUM CHLORIDE 0.9 % IV SOLN
Freq: Once | INTRAVENOUS | Status: AC
  Administered 2014-04-28: 11:00:00 via INTRAVENOUS

## 2014-04-28 MED ORDER — FAMOTIDINE IN NACL 20-0.9 MG/50ML-% IV SOLN
INTRAVENOUS | Status: AC
  Filled 2014-04-28: qty 50

## 2014-04-28 MED ORDER — ELOTUZUMAB CHEMO INJECTION 400 MG
9.9000 mg/kg | Freq: Once | INTRAVENOUS | Status: AC
  Administered 2014-04-28: 800 mg via INTRAVENOUS
  Filled 2014-04-28: qty 32

## 2014-04-28 MED ORDER — DIPHENHYDRAMINE HCL 25 MG PO CAPS
ORAL_CAPSULE | ORAL | Status: AC
  Filled 2014-04-28: qty 2

## 2014-04-28 MED ORDER — SODIUM CHLORIDE 0.9 % IJ SOLN
10.0000 mL | INTRAMUSCULAR | Status: DC | PRN
  Administered 2014-04-28: 10 mL via INTRAVENOUS
  Filled 2014-04-28: qty 10

## 2014-04-28 MED ORDER — FAMOTIDINE IN NACL 20-0.9 MG/50ML-% IV SOLN
20.0000 mg | Freq: Once | INTRAVENOUS | Status: AC
  Administered 2014-04-28: 20 mg via INTRAVENOUS

## 2014-04-28 NOTE — Patient Instructions (Signed)
[facility] Discharge Instructions for Patients Receiving Chemotherapy  Today you received the following chemotherapy agents Elotuzaumab.  To help prevent nausea and vomiting after your treatment, we encourage you to take your nausea medication as prescribed.   If you develop nausea and vomiting that is not controlled by your nausea medication, call the clinic.   BELOW ARE SYMPTOMS THAT SHOULD BE REPORTED IMMEDIATELY:  *FEVER GREATER THAN 100.5 F  *CHILLS WITH OR WITHOUT FEVER  NAUSEA AND VOMITING THAT IS NOT CONTROLLED WITH YOUR NAUSEA MEDICATION  *UNUSUAL SHORTNESS OF BREATH  *UNUSUAL BRUISING OR BLEEDING  TENDERNESS IN MOUTH AND THROAT WITH OR WITHOUT PRESENCE OF ULCERS  *URINARY PROBLEMS  *BOWEL PROBLEMS  UNUSUAL RASH Items with * indicate a potential emergency and should be followed up as soon as possible.  Feel free to call the clinic you have any questions or concerns. The clinic phone number is [phone].  Please show the [place] at check-in to the Emergency Department and triage nurse.

## 2014-04-28 NOTE — Patient Instructions (Signed)

## 2014-04-28 NOTE — Progress Notes (Signed)
Per office note, Dr Alvy Bimler aware of renal insufficiency. Crt 1.6 today, consistent with previous treatment.

## 2014-05-02 ENCOUNTER — Telehealth: Payer: Self-pay | Admitting: *Deleted

## 2014-05-02 NOTE — Telephone Encounter (Signed)
Received fax from [facility] to be shipped 05/02/14

## 2014-05-05 ENCOUNTER — Other Ambulatory Visit (HOSPITAL_BASED_OUTPATIENT_CLINIC_OR_DEPARTMENT_OTHER): Payer: Medicare PPO

## 2014-05-05 ENCOUNTER — Ambulatory Visit (HOSPITAL_BASED_OUTPATIENT_CLINIC_OR_DEPARTMENT_OTHER): Payer: Medicare PPO

## 2014-05-05 ENCOUNTER — Ambulatory Visit: Payer: Medicare PPO

## 2014-05-05 VITALS — BP 137/60 | HR 52 | Temp 98.5°F | Resp 17 | Ht 71.0 in

## 2014-05-05 DIAGNOSIS — C9000 Multiple myeloma not having achieved remission: Secondary | ICD-10-CM

## 2014-05-05 DIAGNOSIS — Z5112 Encounter for antineoplastic immunotherapy: Secondary | ICD-10-CM

## 2014-05-05 DIAGNOSIS — Z95828 Presence of other vascular implants and grafts: Secondary | ICD-10-CM

## 2014-05-05 LAB — COMPREHENSIVE METABOLIC PANEL (CC13)
ALBUMIN: 3.1 g/dL — AB (ref 3.5–5.0)
ALT: 34 U/L (ref 0–55)
ANION GAP: 10 meq/L (ref 3–11)
AST: 23 U/L (ref 5–34)
Alkaline Phosphatase: 97 U/L (ref 40–150)
BUN: 16 mg/dL (ref 7.0–26.0)
CALCIUM: 9.1 mg/dL (ref 8.4–10.4)
CO2: 21 mEq/L — ABNORMAL LOW (ref 22–29)
Chloride: 103 mEq/L (ref 98–109)
Creatinine: 1.4 mg/dL — ABNORMAL HIGH (ref 0.7–1.3)
EGFR: 57 mL/min/{1.73_m2} — ABNORMAL LOW (ref >90)
GLUCOSE: 89 mg/dL (ref 70–140)
Potassium: 4.1 mEq/L (ref 3.5–5.1)
Sodium: 135 mEq/L — ABNORMAL LOW (ref 136–145)
Total Bilirubin: 0.47 mg/dL (ref 0.20–1.20)
Total Protein: 6.5 g/dL (ref 6.4–8.3)

## 2014-05-05 LAB — CBC WITH DIFFERENTIAL/PLATELET
BASO%: 1.4 % (ref 0.0–2.0)
Basophils Absolute: 0.1 10*3/uL (ref 0.0–0.1)
EOS%: 3.3 % (ref 0.0–7.0)
Eosinophils Absolute: 0.3 10*3/uL (ref 0.0–0.5)
HCT: 31.4 % — ABNORMAL LOW (ref 38.4–49.9)
HEMOGLOBIN: 10.2 g/dL — AB (ref 13.0–17.1)
LYMPH%: 17.8 % (ref 14.0–49.0)
MCH: 28.4 pg (ref 27.2–33.4)
MCHC: 32.6 g/dL (ref 32.0–36.0)
MCV: 87.2 fL (ref 79.3–98.0)
MONO#: 0.9 10*3/uL (ref 0.1–0.9)
MONO%: 9.2 % (ref 0.0–14.0)
NEUT#: 6.5 10*3/uL (ref 1.5–6.5)
NEUT%: 68.3 % (ref 39.0–75.0)
PLATELETS: 413 10*3/uL — AB (ref 140–400)
RBC: 3.61 10*6/uL — AB (ref 4.20–5.82)
RDW: 15.9 % — ABNORMAL HIGH (ref 11.0–14.6)
WBC: 9.5 10*3/uL (ref 4.0–10.3)
lymph#: 1.7 10*3/uL (ref 0.9–3.3)

## 2014-05-05 MED ORDER — SODIUM CHLORIDE 0.9 % IJ SOLN
10.0000 mL | INTRAMUSCULAR | Status: DC | PRN
  Administered 2014-05-05: 10 mL via INTRAVENOUS
  Filled 2014-05-05: qty 10

## 2014-05-05 MED ORDER — SODIUM CHLORIDE 0.9 % IJ SOLN
10.0000 mL | INTRAMUSCULAR | Status: DC | PRN
  Administered 2014-05-05: 10 mL
  Filled 2014-05-05: qty 10

## 2014-05-05 MED ORDER — SODIUM CHLORIDE 0.9 % IV SOLN
9.7000 mg/kg | Freq: Once | INTRAVENOUS | Status: AC
  Administered 2014-05-05: 800 mg via INTRAVENOUS
  Filled 2014-05-05: qty 32

## 2014-05-05 MED ORDER — SODIUM CHLORIDE 0.9 % IV SOLN
Freq: Once | INTRAVENOUS | Status: AC
  Administered 2014-05-05: 12:00:00 via INTRAVENOUS
  Filled 2014-05-05: qty 4

## 2014-05-05 MED ORDER — FAMOTIDINE IN NACL 20-0.9 MG/50ML-% IV SOLN
20.0000 mg | Freq: Once | INTRAVENOUS | Status: AC
  Administered 2014-05-05: 20 mg via INTRAVENOUS

## 2014-05-05 MED ORDER — DIPHENHYDRAMINE HCL 25 MG PO CAPS
50.0000 mg | ORAL_CAPSULE | Freq: Once | ORAL | Status: AC
  Administered 2014-05-05: 50 mg via ORAL

## 2014-05-05 MED ORDER — FAMOTIDINE IN NACL 20-0.9 MG/50ML-% IV SOLN
INTRAVENOUS | Status: AC
  Filled 2014-05-05: qty 50

## 2014-05-05 MED ORDER — ACETAMINOPHEN 325 MG PO TABS
650.0000 mg | ORAL_TABLET | Freq: Once | ORAL | Status: AC
  Administered 2014-05-05: 650 mg via ORAL

## 2014-05-05 MED ORDER — ACETAMINOPHEN 325 MG PO TABS
ORAL_TABLET | ORAL | Status: AC
  Filled 2014-05-05: qty 2

## 2014-05-05 MED ORDER — DIPHENHYDRAMINE HCL 25 MG PO CAPS
ORAL_CAPSULE | ORAL | Status: AC
  Filled 2014-05-05: qty 2

## 2014-05-05 MED ORDER — HEPARIN SOD (PORK) LOCK FLUSH 100 UNIT/ML IV SOLN
500.0000 [IU] | Freq: Once | INTRAVENOUS | Status: AC | PRN
  Administered 2014-05-05: 500 [IU]
  Filled 2014-05-05: qty 5

## 2014-05-05 MED ORDER — SODIUM CHLORIDE 0.9 % IV SOLN
Freq: Once | INTRAVENOUS | Status: AC
  Administered 2014-05-05: 12:00:00 via INTRAVENOUS

## 2014-05-05 NOTE — Patient Instructions (Signed)

## 2014-05-05 NOTE — Patient Instructions (Signed)
[facility] Discharge Instructions for Patients Receiving Chemotherapy  Today you received the following chemotherapy agents: Empliciti.  To help prevent nausea and vomiting after your treatment, we encourage you to take your nausea medication: Take one every eight hours as needed.    If you develop nausea and vomiting that is not controlled by your nausea medication, call the clinic.   BELOW ARE SYMPTOMS THAT SHOULD BE REPORTED IMMEDIATELY:  *FEVER GREATER THAN 100.5 F  *CHILLS WITH OR WITHOUT FEVER  NAUSEA AND VOMITING THAT IS NOT CONTROLLED WITH YOUR NAUSEA MEDICATION  *UNUSUAL SHORTNESS OF BREATH  *UNUSUAL BRUISING OR BLEEDING  TENDERNESS IN MOUTH AND THROAT WITH OR WITHOUT PRESENCE OF ULCERS  *URINARY PROBLEMS  *BOWEL PROBLEMS  UNUSUAL RASH Items with * indicate a potential emergency and should be followed up as soon as possible.  Feel free to call the clinic should you have any questions or concerns. The clinic phone number is [phone].  Please show the [place] at check-in to the Emergency Department and triage nurse.

## 2014-05-09 LAB — SPEP & IFE WITH QIG
ALPHA-1-GLOBULIN: 0.4 g/dL — AB (ref 0.2–0.3)
Abnormal Protein Band1: 0.6 g/dL
Albumin ELP: 3.1 g/dL — ABNORMAL LOW (ref 3.8–4.8)
Alpha-2-Globulin: 0.8 g/dL (ref 0.5–0.9)
Beta 2: 0.3 g/dL (ref 0.2–0.5)
Beta Globulin: 0.5 g/dL (ref 0.4–0.6)
Gamma Globulin: 1.1 g/dL (ref 0.8–1.7)
IgA: 82 mg/dL (ref 68–379)
IgG (Immunoglobin G), Serum: 1410 mg/dL (ref 650–1600)
IgM, Serum: 21 mg/dL — ABNORMAL LOW (ref 41–251)
TOTAL PROTEIN, SERUM ELECTROPHOR: 6.1 g/dL (ref 6.1–8.1)

## 2014-05-09 LAB — KAPPA/LAMBDA LIGHT CHAINS
KAPPA FREE LGHT CHN: 7.85 mg/dL — AB (ref 0.33–1.94)
KAPPA LAMBDA RATIO: 3.89 — AB (ref 0.26–1.65)
Lambda Free Lght Chn: 2.02 mg/dL (ref 0.57–2.63)

## 2014-05-09 LAB — BETA 2 MICROGLOBULIN, SERUM: Beta-2 Microglobulin: 4.26 mg/L — ABNORMAL HIGH (ref <=2.51)

## 2014-05-10 ENCOUNTER — Telehealth: Payer: Self-pay | Admitting: Hematology and Oncology

## 2014-05-10 NOTE — Telephone Encounter (Signed)
Per staff message from [facility] cx 5/5 appointments due to this is patients off week. Appointments cxd. S/w patient he is aware - confirmed next appointment for 05/19/14.

## 2014-05-12 ENCOUNTER — Ambulatory Visit: Payer: Medicare PPO

## 2014-05-12 ENCOUNTER — Other Ambulatory Visit: Payer: Medicare PPO

## 2014-05-19 ENCOUNTER — Ambulatory Visit (HOSPITAL_BASED_OUTPATIENT_CLINIC_OR_DEPARTMENT_OTHER): Payer: Medicare PPO

## 2014-05-19 ENCOUNTER — Telehealth: Payer: Self-pay | Admitting: Hematology and Oncology

## 2014-05-19 ENCOUNTER — Ambulatory Visit: Payer: Medicare PPO

## 2014-05-19 ENCOUNTER — Ambulatory Visit (HOSPITAL_BASED_OUTPATIENT_CLINIC_OR_DEPARTMENT_OTHER): Payer: Medicare PPO | Admitting: Hematology and Oncology

## 2014-05-19 ENCOUNTER — Other Ambulatory Visit (HOSPITAL_BASED_OUTPATIENT_CLINIC_OR_DEPARTMENT_OTHER): Payer: Medicare PPO

## 2014-05-19 ENCOUNTER — Encounter: Payer: Self-pay | Admitting: Hematology and Oncology

## 2014-05-19 VITALS — BP 124/58 | HR 73 | Temp 98.1°F | Resp 17 | Ht 71.0 in | Wt 169.6 lb

## 2014-05-19 VITALS — BP 107/56 | HR 57 | Temp 97.5°F | Resp 20

## 2014-05-19 DIAGNOSIS — G620 Drug-induced polyneuropathy: Secondary | ICD-10-CM

## 2014-05-19 DIAGNOSIS — C9000 Multiple myeloma not having achieved remission: Secondary | ICD-10-CM

## 2014-05-19 DIAGNOSIS — N183 Chronic kidney disease, stage 3 unspecified: Secondary | ICD-10-CM

## 2014-05-19 DIAGNOSIS — N2889 Other specified disorders of kidney and ureter: Secondary | ICD-10-CM

## 2014-05-19 DIAGNOSIS — N182 Chronic kidney disease, stage 2 (mild): Secondary | ICD-10-CM

## 2014-05-19 DIAGNOSIS — Z5112 Encounter for antineoplastic immunotherapy: Secondary | ICD-10-CM | POA: Diagnosis not present

## 2014-05-19 DIAGNOSIS — D631 Anemia in chronic kidney disease: Secondary | ICD-10-CM

## 2014-05-19 DIAGNOSIS — N189 Chronic kidney disease, unspecified: Secondary | ICD-10-CM

## 2014-05-19 DIAGNOSIS — T451X5A Adverse effect of antineoplastic and immunosuppressive drugs, initial encounter: Secondary | ICD-10-CM

## 2014-05-19 DIAGNOSIS — Z95828 Presence of other vascular implants and grafts: Secondary | ICD-10-CM

## 2014-05-19 LAB — CBC WITH DIFFERENTIAL/PLATELET
BASO%: 1.4 % (ref 0.0–2.0)
Basophils Absolute: 0.1 10*3/uL (ref 0.0–0.1)
EOS%: 9.8 % — AB (ref 0.0–7.0)
Eosinophils Absolute: 0.7 10*3/uL — ABNORMAL HIGH (ref 0.0–0.5)
HCT: 30.8 % — ABNORMAL LOW (ref 38.4–49.9)
HGB: 10.1 g/dL — ABNORMAL LOW (ref 13.0–17.1)
LYMPH%: 15.6 % (ref 14.0–49.0)
MCH: 28.3 pg (ref 27.2–33.4)
MCHC: 32.8 g/dL (ref 32.0–36.0)
MCV: 86.2 fL (ref 79.3–98.0)
MONO#: 0.7 10*3/uL (ref 0.1–0.9)
MONO%: 9.2 % (ref 0.0–14.0)
NEUT#: 4.8 10*3/uL (ref 1.5–6.5)
NEUT%: 64 % (ref 39.0–75.0)
PLATELETS: 323 10*3/uL (ref 140–400)
RBC: 3.58 10*6/uL — ABNORMAL LOW (ref 4.20–5.82)
RDW: 15.8 % — ABNORMAL HIGH (ref 11.0–14.6)
WBC: 7.5 10*3/uL (ref 4.0–10.3)
lymph#: 1.2 10*3/uL (ref 0.9–3.3)

## 2014-05-19 LAB — COMPREHENSIVE METABOLIC PANEL (CC13)
ALBUMIN: 3 g/dL — AB (ref 3.5–5.0)
ALT: 18 U/L (ref 0–55)
ANION GAP: 8 meq/L (ref 3–11)
AST: 18 U/L (ref 5–34)
Alkaline Phosphatase: 94 U/L (ref 40–150)
BUN: 13.8 mg/dL (ref 7.0–26.0)
CALCIUM: 8.7 mg/dL (ref 8.4–10.4)
CHLORIDE: 104 meq/L (ref 98–109)
CO2: 23 mEq/L (ref 22–29)
Creatinine: 1.5 mg/dL — ABNORMAL HIGH (ref 0.7–1.3)
EGFR: 53 mL/min/{1.73_m2} — AB (ref >90)
Glucose: 88 mg/dl (ref 70–140)
POTASSIUM: 4 meq/L (ref 3.5–5.1)
SODIUM: 135 meq/L — AB (ref 136–145)
Total Bilirubin: 0.47 mg/dL (ref 0.20–1.20)
Total Protein: 6.3 g/dL — ABNORMAL LOW (ref 6.4–8.3)

## 2014-05-19 MED ORDER — ACETAMINOPHEN 325 MG PO TABS
650.0000 mg | ORAL_TABLET | Freq: Once | ORAL | Status: AC
  Administered 2014-05-19: 650 mg via ORAL

## 2014-05-19 MED ORDER — HEPARIN SOD (PORK) LOCK FLUSH 100 UNIT/ML IV SOLN
500.0000 [IU] | Freq: Once | INTRAVENOUS | Status: AC | PRN
  Administered 2014-05-19: 500 [IU]
  Filled 2014-05-19: qty 5

## 2014-05-19 MED ORDER — LIDOCAINE-PRILOCAINE 2.5-2.5 % EX CREA: 1.0000 "application " | TOPICAL_CREAM | CUTANEOUS | Status: DC | PRN

## 2014-05-19 MED ORDER — DEXAMETHASONE SODIUM PHOSPHATE 100 MG/10ML IJ SOLN
Freq: Once | INTRAMUSCULAR | Status: AC
  Administered 2014-05-19: 12:00:00 via INTRAVENOUS
  Filled 2014-05-19: qty 4

## 2014-05-19 MED ORDER — DIPHENHYDRAMINE HCL 25 MG PO CAPS
50.0000 mg | ORAL_CAPSULE | Freq: Once | ORAL | Status: AC
  Administered 2014-05-19: 50 mg via ORAL

## 2014-05-19 MED ORDER — SODIUM CHLORIDE 0.9 % IV SOLN
9.8000 mg/kg | Freq: Once | INTRAVENOUS | Status: AC
  Administered 2014-05-19: 800 mg via INTRAVENOUS
  Filled 2014-05-19: qty 32

## 2014-05-19 MED ORDER — FAMOTIDINE IN NACL 20-0.9 MG/50ML-% IV SOLN
20.0000 mg | Freq: Once | INTRAVENOUS | Status: AC
  Administered 2014-05-19: 20 mg via INTRAVENOUS

## 2014-05-19 MED ORDER — SODIUM CHLORIDE 0.9 % IJ SOLN
10.0000 mL | INTRAMUSCULAR | Status: DC | PRN
  Administered 2014-05-19: 10 mL
  Filled 2014-05-19: qty 10

## 2014-05-19 MED ORDER — SODIUM CHLORIDE 0.9 % IJ SOLN
10.0000 mL | INTRAMUSCULAR | Status: DC | PRN
  Administered 2014-05-19: 10 mL via INTRAVENOUS
  Filled 2014-05-19: qty 10

## 2014-05-19 MED ORDER — FAMOTIDINE IN NACL 20-0.9 MG/50ML-% IV SOLN
INTRAVENOUS | Status: AC
  Filled 2014-05-19: qty 50

## 2014-05-19 MED ORDER — DIPHENHYDRAMINE HCL 25 MG PO CAPS
ORAL_CAPSULE | ORAL | Status: AC
  Filled 2014-05-19: qty 2

## 2014-05-19 MED ORDER — SODIUM CHLORIDE 0.9 % IV SOLN
Freq: Once | INTRAVENOUS | Status: AC
  Administered 2014-05-19: 12:00:00 via INTRAVENOUS

## 2014-05-19 MED ORDER — ACETAMINOPHEN 325 MG PO TABS
ORAL_TABLET | ORAL | Status: AC
  Filled 2014-05-19: qty 2

## 2014-05-19 NOTE — Patient Instructions (Signed)
[facility] Discharge Instructions for Patients Receiving Chemotherapy  Today you received the following chemotherapy agents Elotuzumab To help prevent nausea and vomiting after your treatment, we encourage you to take your nausea medication as prescribed.   If you develop nausea and vomiting that is not controlled by your nausea medication, call the clinic.   BELOW ARE SYMPTOMS THAT SHOULD BE REPORTED IMMEDIATELY:  *FEVER GREATER THAN 100.5 F  *CHILLS WITH OR WITHOUT FEVER  NAUSEA AND VOMITING THAT IS NOT CONTROLLED WITH YOUR NAUSEA MEDICATION  *UNUSUAL SHORTNESS OF BREATH  *UNUSUAL BRUISING OR BLEEDING  TENDERNESS IN MOUTH AND THROAT WITH OR WITHOUT PRESENCE OF ULCERS  *URINARY PROBLEMS  *BOWEL PROBLEMS  UNUSUAL RASH Items with * indicate a potential emergency and should be followed up as soon as possible.  Feel free to call the clinic you have any questions or concerns. The clinic phone number is [phone].  Please show the [place] at check-in to the Emergency Department and triage nurse.

## 2014-05-19 NOTE — Patient Instructions (Signed)

## 2014-05-19 NOTE — Telephone Encounter (Signed)
Gave and printed papt sched and avs fo rpt for June....lvm for Dr. Lawana Chambers office

## 2014-05-20 NOTE — Assessment & Plan Note (Signed)
He has moderate peripheral neuropathy which is suspect is related to side effects of prior treatment. For this reason, I would not recommend Velcade. Continue close observation.

## 2014-05-20 NOTE — Assessment & Plan Note (Signed)
This is chronic in nature. It is stable. Recommend observation only.

## 2014-05-20 NOTE — Assessment & Plan Note (Signed)
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 The patient denies recent history of bleeding such as epistaxis, hematuria or hematochezia. He is asymptomatic from the anemia. We will observe for now.  He does not require transfusion now.

## 2014-05-20 NOTE — Assessment & Plan Note (Signed)
He has achieved great response to treatment. I did not see any major further improvement with the combination therapy. I recommend discontinuation of Elotuzumab and just go on maintenance Revlimid at 3 weeks on 1 week off He has not obtained dental clearance due to insurance issue. I will refer him to see our dentist here so that we can proceed with Zometa in the future. He will return here for port flushes to maintain port patency. He will continue on preventive therapy with aspirin, calcium and Vitamin D

## 2014-05-20 NOTE — Progress Notes (Signed)
[place] OFFICE PROGRESS NOTE  Patient Care Team: Lucianne Lei, MD as PCP - General (Family Medicine)  SUMMARY OF ONCOLOGIC HISTORY:   Multiple myeloma   12/12/2010 Initial Diagnosis Multiple myeloma   02/16/2014 Imaging Skeletal survey showed diffuse osteopenia   03/02/2014 Bone Marrow Biopsy Accession: [id number] BM biopsy showed only 5 % plasma cells. However, the biopsy was difficult and the bone was fragmented during the procedure. Cytogenetics and FISH study is normal   03/03/2014 Procedure he has port placement   03/10/2014 - 05/19/2014 Chemotherapy he received elotuzumab and revlimid   05/20/2014 -  Chemotherapy Treatment is switched to maintenance Revlimid only    INTERVAL HISTORY: Please see below for problem oriented charting. He returns prior to his infusion treatment. He is doing well. No side-effects from treatment Has persistent mild neuropathy from prior treatment He complained of fatigue. He denies recent infection. He is not able to see a dentist due to insurance issue  REVIEW OF SYSTEMS:   Constitutional: Denies fevers, chills or abnormal weight loss Eyes: Denies blurriness of vision Ears, nose, mouth, throat, and face: Denies mucositis or sore throat Respiratory: Denies cough, dyspnea or wheezes Cardiovascular: Denies palpitation, chest discomfort or lower extremity swelling Gastrointestinal:  Denies nausea, heartburn or change in bowel habits Skin: Denies abnormal skin rashes Lymphatics: Denies new lymphadenopathy or easy bruising Neurological:Denies numbness, tingling or new weaknesses Behavioral/Psych: Mood is stable, no new changes  All other systems were reviewed with the patient and are negative.  I have reviewed the past medical history, past surgical history, social history and family history with the patient and they are unchanged from previous note.  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  MEDICATIONS:  Current Outpatient  Prescriptions  Medication Sig Dispense Refill  . allopurinol (ZYLOPRIM) 100 MG tablet Take 100 mg by mouth 2 (two) times daily.     Marland Kitchen amLODipine (NORVASC) 10 MG tablet TAKE 1 TABLET BY MOUTH EVERY DAY 30 tablet 1  . aspirin EC 81 MG tablet Take 81 mg by mouth daily.    Marland Kitchen atorvastatin (LIPITOR) 20 MG tablet Take 20 mg by mouth at bedtime.     . cholecalciferol (VITAMIN D) 1000 UNITS tablet Take 1,000 Units by mouth daily.    . dorzolamide-timolol (COSOPT) 22.3-6.8 MG/ML ophthalmic solution     . hydrochlorothiazide (HYDRODIURIL) 25 MG tablet TAKE 1 TABLET BY MOUTH EVERY DAY 90 tablet 4  . KLOR-CON M20 20 MEQ tablet TAKE 1 TABLET BY MOUTH EVERY DAY 90 tablet 1  . lenalidomide (REVLIMID) 5 MG capsule Take 1 capsule (5 mg total) by mouth daily. Take 1 capsule daily for 21 days and then rest 7 days 21 capsule 0  . lidocaine-prilocaine (EMLA) cream Apply 1 application topically as needed. 30 g 1  . loperamide (IMODIUM A-D) 2 MG tablet 1 po after each watery BM.  Maximum 6 per 24hrs. 30 tablet PRN  . ondansetron (ZOFRAN) 8 MG tablet Take 1 tablet (8 mg total) by mouth 3 (three) times daily as needed for nausea or vomiting. 30 tablet 1  . PRESCRIPTION MEDICATION [facility] Antibody Plan     No current facility-administered medications for this visit.    PHYSICAL EXAMINATION: ECOG PERFORMANCE STATUS: 1 - Symptomatic but completely ambulatory  Filed Vitals:   05/19/14 1114  BP: 124/58  Pulse: 73  Temp: 98.1 F (36.7 C)  Resp: 17   Filed Weights   05/19/14 1114  Weight: 169 lb 9.6 oz (76.93 kg)  GENERAL:alert, no distress and comfortable SKIN: skin color, texture, turgor are normal, no rashes or significant lesions EYES: normal, Conjunctiva are pink and non-injected, sclera clear OROPHARYNX:no exudate, no erythema and lips, buccal mucosa, and tongue normal  NECK: supple, thyroid normal size, non-tender, without nodularity LYMPH:  no palpable lymphadenopathy in the cervical, axillary or  inguinal LUNGS: clear to auscultation and percussion with normal breathing effort HEART: regular rate & rhythm and no murmurs and no lower extremity edema ABDOMEN:abdomen soft, non-tender and normal bowel sounds Musculoskeletal:no cyanosis of digits and no clubbing  NEURO: alert & oriented x 3 with fluent speech, no focal motor/sensory deficits  LABORATORY DATA:  I have reviewed the data as listed    Component Value Date/Time   NA 135* 05/19/2014 1035   NA 133* 04/21/2014 1030   K 4.0 05/19/2014 1035   K 3.7 04/21/2014 1030   CL 102 04/21/2014 1030   CL 107 06/29/2012 1131   CO2 23 05/19/2014 1035   CO2 24 04/21/2014 1030   GLUCOSE 88 05/19/2014 1035   GLUCOSE 76 04/21/2014 1030   GLUCOSE 143* 06/29/2012 1131   BUN 13.8 05/19/2014 1035   BUN 28* 04/21/2014 1030   CREATININE 1.5* 05/19/2014 1035   CREATININE 1.81* 04/21/2014 1030   CREATININE 2.03* 12/14/2010 1114   CALCIUM 8.7 05/19/2014 1035   CALCIUM 8.5 04/21/2014 1030   PROT 6.3* 05/19/2014 1035   PROT 7.1 04/21/2014 1030   ALBUMIN 3.0* 05/19/2014 1035   ALBUMIN 3.4* 04/21/2014 1030   AST 18 05/19/2014 1035   AST 19 04/21/2014 1030   ALT 18 05/19/2014 1035   ALT 22 04/21/2014 1030   ALKPHOS 94 05/19/2014 1035   ALKPHOS 84 04/21/2014 1030   BILITOT 0.47 05/19/2014 1035   BILITOT 0.4 04/21/2014 1030   GFRNONAA 36* 04/21/2014 1030   GFRAA 42* 04/21/2014 1030    No results found for: SPEP, UPEP  Lab Results  Component Value Date   WBC 7.5 05/19/2014   NEUTROABS 4.8 05/19/2014   HGB 10.1* 05/19/2014   HCT 30.8* 05/19/2014   MCV 86.2 05/19/2014   PLT 323 05/19/2014      Chemistry      Component Value Date/Time   NA 135* 05/19/2014 1035   NA 133* 04/21/2014 1030   K 4.0 05/19/2014 1035   K 3.7 04/21/2014 1030   CL 102 04/21/2014 1030   CL 107 06/29/2012 1131   CO2 23 05/19/2014 1035   CO2 24 04/21/2014 1030   BUN 13.8 05/19/2014 1035   BUN 28* 04/21/2014 1030   CREATININE 1.5* 05/19/2014 1035    CREATININE 1.81* 04/21/2014 1030   CREATININE 2.03* 12/14/2010 1114      Component Value Date/Time   CALCIUM 8.7 05/19/2014 1035   CALCIUM 8.5 04/21/2014 1030   ALKPHOS 94 05/19/2014 1035   ALKPHOS 84 04/21/2014 1030   AST 18 05/19/2014 1035   AST 19 04/21/2014 1030   ALT 18 05/19/2014 1035   ALT 22 04/21/2014 1030   BILITOT 0.47 05/19/2014 1035   BILITOT 0.4 04/21/2014 1030     ASSESSMENT & PLAN:  Multiple myeloma He has achieved great response to treatment. I did not see any major further improvement with the combination therapy. I recommend discontinuation of Elotuzumab and just go on maintenance Revlimid at 3 weeks on 1 week off He has not obtained dental clearance due to insurance issue. I will refer him to see our dentist here so that we can proceed with Zometa in the  future. He will return here for port flushes to maintain port patency. He will continue on preventive therapy with aspirin, calcium and Vitamin D   Anemia in chronic renal disease This is likely anemia of chronic disease from renal failure and relapsed myeloma. The patient denies recent history of bleeding such as epistaxis, hematuria or hematochezia. He is asymptomatic from the anemia. We will observe for now.  He does not require transfusion now.        Chronic renal insufficiency, stage III (moderate) This is chronic in nature. It is stable. Recommend observation only.   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 For this reason, I would not recommend Velcade. Continue close observation.     Orders Placed This Encounter  Procedures  . Ambulatory referral to Dentistry    Referral Priority:  Routine    Referral Type:  Consultation    Referral Reason:  Specialty Services Required    Requested Specialty:  Dental General Practice    Number of Visits Requested:  1   All questions were answered. The  patient knows to call the clinic with any problems, questions or concerns. No barriers to learning was detected. I spent 25 minutes counseling the patient face to face. The total time spent in the appointment was 30 minutes and more than 50% was on counseling and review of test results     [facility], Sierra Brooks, MD 05/20/2014 7:39 PM

## 2014-05-24 ENCOUNTER — Encounter (HOSPITAL_COMMUNITY): Payer: Self-pay | Admitting: Dentistry

## 2014-05-24 ENCOUNTER — Ambulatory Visit (HOSPITAL_COMMUNITY): Payer: Self-pay | Admitting: Dentistry

## 2014-05-24 VITALS — BP 125/56 | HR 54 | Temp 97.9°F

## 2014-05-24 DIAGNOSIS — K08109 Complete loss of teeth, unspecified cause, unspecified class: Secondary | ICD-10-CM

## 2014-05-24 DIAGNOSIS — K036 Deposits [accretions] on teeth: Secondary | ICD-10-CM

## 2014-05-24 DIAGNOSIS — K045 Chronic apical periodontitis: Secondary | ICD-10-CM

## 2014-05-24 DIAGNOSIS — K0530 Chronic periodontitis, unspecified: Secondary | ICD-10-CM

## 2014-05-24 DIAGNOSIS — K030 Excessive attrition of teeth: Secondary | ICD-10-CM

## 2014-05-24 DIAGNOSIS — K0889 Other specified disorders of teeth and supporting structures: Secondary | ICD-10-CM

## 2014-05-24 DIAGNOSIS — C9000 Multiple myeloma not having achieved remission: Secondary | ICD-10-CM | POA: Diagnosis not present

## 2014-05-24 DIAGNOSIS — IMO0002 Reserved for concepts with insufficient information to code with codable children: Secondary | ICD-10-CM

## 2014-05-24 DIAGNOSIS — M264 Malocclusion, unspecified: Secondary | ICD-10-CM

## 2014-05-24 DIAGNOSIS — Z01818 Encounter for other preprocedural examination: Secondary | ICD-10-CM | POA: Diagnosis not present

## 2014-05-24 NOTE — Progress Notes (Signed)
DENTAL CONSULTATION  Date of Consultation:  05/24/2014   Patient Name:   Calvin Mccormick Date of Birth:   1945-11-27 Medical Record Number: [id number]  VITALS: BP 125/56 mmHg  Pulse 54  Temp(Src) 97.9 F (36.6 C) (Oral)  CHIEF COMPLAINT: Patient referred by Dr. Alvy Bimler for a pre-Zometa dental consultation.  HPI: Calvin Mccormick is a 69 year old male with multiple myeloma. Patient currently with Revlimid therapy. Patient with anticipated use of Zometa therapy. Patient is now seen as part of a pre-Zometa dental examination to rule out dental infection that may affect the patient's systemic health and to discuss risk for Zometa-induced osteonecrosis of the jaw with anticipated invasive dental procedures.  The patient currently denies acute toothaches, swellings, or abscesses. Patient was last seen approximately 8 years ago to have a tooth pulled. Patient denies having any complications from that dental extraction. Patient does not seek regular dental care. Patient has no partial dentures.  PROBLEM LIST: Patient Active Problem List   Diagnosis Date Noted  . Multiple myeloma 12/12/2010    Priority: High  . Neuropathy due to chemotherapeutic drug 02/21/2014  . Cellulitis and abscess of hand 08/05/2011  . Hypomagnesemia 07/29/2011  . Hypokalemia with normal acid-base balance 07/29/2011  . HTN (hypertension) 07/24/2011  . Anemia in chronic renal disease 07/24/2011  . Diarrhea 06/17/2011  . Chronic renal insufficiency, stage III (moderate) 06/17/2011  . CKD (chronic kidney disease), stage III 02/05/2011  . 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 Anemia 11/27/2010  . Hypertension, benign essential, goal below 140/90 11/27/2010  . Multiple myeloma without mention of remission 12/12/2010  . CKD (chronic kidney disease), stage III 02/05/2011  . Hyperlipemia   . Diarrhea 06/17/2011    Hospital admission 05/30/11 velcade toxicity? Infectious?  .  Chronic renal insufficiency, stage III (moderate) 06/17/2011    Due to myeloma & HTN  . Hypomagnesemia 07/29/2011  . Hypokalemia with normal acid-base balance 07/29/2011    PSH: Past Surgical History  Procedure Laterality Date  . Colonoscopy  07/26/2011    Procedure: COLONOSCOPY;  Surgeon: Cleotis Nipper, MD;  Location: [facility];  Service: Endoscopy;  Laterality: N/A;    ALLERGIES: Allergies  Allergen Reactions  . Lactose Intolerance (Gi) Nausea And Vomiting    MEDICATIONS: Current Outpatient Prescriptions  Medication Sig Dispense Refill  . allopurinol (ZYLOPRIM) 100 MG tablet Take 100 mg by mouth 2 (two) times daily.     Marland Kitchen amLODipine (NORVASC) 10 MG tablet TAKE 1 TABLET BY MOUTH EVERY DAY 30 tablet 1  . aspirin EC 81 MG tablet Take 81 mg by mouth daily.    Marland Kitchen atorvastatin (LIPITOR) 20 MG tablet Take 20 mg by mouth at bedtime.     . cholecalciferol (VITAMIN D) 1000 UNITS tablet Take 1,000 Units by mouth daily.    . dorzolamide-timolol (COSOPT) 22.3-6.8 MG/ML ophthalmic solution     . hydrochlorothiazide (HYDRODIURIL) 25 MG tablet TAKE 1 TABLET BY MOUTH EVERY DAY 90 tablet 4  . KLOR-CON M20 20 MEQ tablet TAKE 1 TABLET BY MOUTH EVERY DAY 90 tablet 1  . lenalidomide (REVLIMID) 5 MG capsule Take 1 capsule (5 mg total) by mouth daily. Take 1 capsule daily for 21 days and then rest 7 days 21 capsule 0  . lidocaine-prilocaine (EMLA) cream Apply 1 application topically as needed. 30 g 1  . loperamide (IMODIUM A-D) 2 MG tablet 1 po after each watery BM.  Maximum 6 per 24hrs. 30 tablet  PRN  . ondansetron (ZOFRAN) 8 MG tablet Take 1 tablet (8 mg total) by mouth 3 (three) times daily as needed for nausea or vomiting. 30 tablet 1  . PRESCRIPTION MEDICATION [facility] Antibody Plan     No current facility-administered medications for this visit.    LABS: Lab Results  Component Value Date   WBC 7.5 05/19/2014   HGB 10.1* 05/19/2014   HCT 30.8* 05/19/2014   MCV 86.2 05/19/2014   PLT 323  05/19/2014      Component Value Date/Time   NA 135* 05/19/2014 1035   NA 133* 04/21/2014 1030   K 4.0 05/19/2014 1035   K 3.7 04/21/2014 1030   CL 102 04/21/2014 1030   CL 107 06/29/2012 1131   CO2 23 05/19/2014 1035   CO2 24 04/21/2014 1030   GLUCOSE 88 05/19/2014 1035   GLUCOSE 76 04/21/2014 1030   GLUCOSE 143* 06/29/2012 1131   BUN 13.8 05/19/2014 1035   BUN 28* 04/21/2014 1030   CREATININE 1.5* 05/19/2014 1035   CREATININE 1.81* 04/21/2014 1030   CREATININE 2.03* 12/14/2010 1114   CALCIUM 8.7 05/19/2014 1035   CALCIUM 8.5 04/21/2014 1030   GFRNONAA 36* 04/21/2014 1030   GFRAA 42* 04/21/2014 1030   Lab Results  Component Value Date   INR 1.00 03/03/2014   INR 1.23 07/25/2011   INR 1.17 07/24/2011   No results found for: PTT  SOCIAL HISTORY: History   Social History  . Marital Status: Married    Spouse Name: N/A  . Number of Children: N/A  . Years of Education: N/A   Occupational History  . Not on file.   Social History Main Topics  . Smoking status: Never Smoker   . Smokeless tobacco: Never Used  . Alcohol Use: No  . Drug Use: No  . Sexual Activity: Not Currently   Other Topics Concern  . Not on file   Social History Narrative    FAMILY HISTORY: Family History  Problem Relation Age of Onset  . Cancer Brother     lung ca    REVIEW OF SYSTEMS: Reviewed with the patient and is included in dental record.  DENTAL HISTORY: CHIEF COMPLAINT: Patient referred by Dr. Alvy Bimler for a pre-Zometa dental consultation.  HPI: Calvin Mccormick is a 69 year old male with multiple myeloma. Patient currently with Revlimid therapy. Patient with anticipated use of Zometa therapy. Patient is now seen as part of a pre-Zometa dental examination to rule out dental infection that may affect the patient's systemic health and to discuss risk for Zometa-induced osteonecrosis of the jaw with anticipated invasive dental procedures.  The patient currently denies acute  toothaches, swellings, or abscesses. Patient was last seen approximately 8 years ago to have a tooth pulled. Patient denies having any complications from that dental extraction. Patient does not seek regular dental care. Patient has no partial dentures.  DENTAL EXAMINATION: GENERAL: The patient is a well-developed, well-nourished male in no acute distress. HEAD AND NECK: There is no palpable submandibualr lymphadenopathy. The patient denies acute TMJ symptoms. INTRAORAL EXAM: Patient has normal saliva. I do not see any evidence of oral abscess formation. There is atrophy of the edentulous alveolar ridges. DENTITION: The patient has multiple missing teeth numbers 2, 3, 4, 5, 9, 12, 14, 15, 16, 17, 18, 19, 20, 29, 30, 31, and 32. PERIODONTAL: Patient has chronic periodontitis with plaque and calculus accumulations, generalized gingival recession, and generalized tooth mobility as per dental charting form. DENTAL CARIES/SUBOPTIMAL RESTORATIONS: Patient has excessive  attrition noted. No obvious dental caries are noted. ENDODONTIC: Patient currently denies acute pulpitis symptoms. Patient has multiple areas of periapical pathology and radiolucency. CROWN AND BRIDGE: There are no crown or bridge restorations. PROSTHODONTIC: Patient denies having any partial dentures. OCCLUSION: Patient has a poor occlusal scheme secondary to multiple missing teeth, supra-eruption and drifting of the unopposed teeth into the edentulous areas, anterior crossbite in the area of tooth numbers 24-28 with tooth numbers 6 and 7, and lack of replacement of missing teeth with dental prostheses.  RADIOGRAPHIC INTERPRETATION: An orthopantogram was taken and supplemented with 10 periapical radiographs. There are multiple missing teeth. There is moderate to severe bone loss. There is evidence of excessive attrition. There is supra-eruption and drifting of the unopposed teeth into the edentulous areas. There is evidence of periapical  pathology and radiolucency. Multiple diastemas are noted. Maxillary sinuses are well aerated. There is atrophy of the edentulous alveolar ridges.  ASSESSMENTS: 1.  Multiple myeloma with Revlimid therapy 2.  Pre-Zometa therapy dental protocol 3. Chronic apical periodontitis 4. Chronic periodontitis with bone loss 5. Generalized gingival recession 6. Accretions 7. Tooth mobility 8. Multiple missing teeth 9. Supra-eruption and drifting of the unopposed teeth into the edentulous areas 10. Anterior crossbite area 6 through 7 with 24 through 28. 11. Malocclusion 12. Areas of excessive attrition 13. Multiple diastemas 14. Atrophy of the edentulous alveolar ridges  PLAN/RECOMMENDATIONS: 1. I discussed the risks, benefits, and complications of various treatment options with the patient in relationship to his medical and dental conditions, anticipated Zometa therapy, and risk for osteonecrosis of the jaws related to the Zometa therapy.  We discussed various treatment options to include no treatment, multiple extractions with alveoloplasty, pre-prosthetic surgery as indicated, periodontal therapy, dental restorations, root canal therapy, crown and bridge therapy, implant therapy, and replacement of missing teeth as indicated. The patient currently wishes to proceed with extraction of remaining teeth with alveoloplasty in the operating room with general anesthesia. This has been scheduled for Friday, 05/27/2014 at 7:30 AM at [facility]. The patient will then follow-up with a dentist of his choice for fabrication of upper and lower complete dentures after adequate healing. Per a phone discussion with Dr. Alvy Bimler, the patient will discontinue Revlimid therapy at this time and Dr. Alvy Bimler will restart Revlimid therapy after adequate healing from the dental extractions. The Zometa therapy should not be started ideally until two to three months after the dental extraction procedures. There is no history  of previous IV bisphosphonate therapy per review of medication list and per discussion with Dr. Alvy Bimler.  2. Discussion of findings with medical team and coordination of future medical and dental care as needed.  I spent in excess of  120 minutes during the conduct of this consultation and >50% of this time involved direct face-to-face encounter for counseling and/or coordination of the patient's care.    Lenn Cal, DDS

## 2014-05-24 NOTE — Patient Instructions (Signed)
Follow-up with presurgical testing as scheduled. Discontinue Revlimid therapy at this time. Nothing by mouth after midnight on Friday morning prior to the general anesthesia and oral surgery procedures. Dr. Enrique Sack

## 2014-05-25 ENCOUNTER — Other Ambulatory Visit (HOSPITAL_COMMUNITY): Payer: Self-pay | Admitting: Anesthesiology

## 2014-05-25 NOTE — Patient Instructions (Addendum)
Calvin Mccormick  05/25/2014   Your procedure is scheduled on: Friday 05/27/2014  Report to [facility] Main  Entrance and follow signs to               [place] [facility] [place].  Call this number if you have problems the morning of surgery [phone]   Remember: ONLY 1 PERSON MAY GO WITH YOU TO SHORT STAY TO GET  READY MORNING OF Calvin Mccormick.   Do not eat food or drink liquids :After Midnight.     Take these medicines the morning of surgery with A SIP OF WATER: Amlodipine (Norvasc), use Cosopt eye drops, use ALphagan eye drops, Timolol eye drops and Fluoromethololone                               You may not have any metal on your body including hair pins and              piercings  Do not wear jewelry, make-up, lotions, powders or perfumes, deodorant             Do not wear nail polish.  Do not shave  48 hours prior to surgery.              Men may shave face and neck.   Do not bring valuables to the hospital. Calvin Mccormick.  Contacts, dentures or bridgework may not be worn into surgery.  Leave suitcase in the car. After surgery it may be brought to your room.     Patients discharged the day of surgery will not be allowed to drive home.  Name and phone number of your driver:  Special Instructions: N/A              Please read over the following fact sheets you were given: _____________________________________________________________________             [facility] - Preparing for Surgery Before surgery, you can play an important role.  Because skin is not sterile, your skin needs to be as free of germs as possible.  You can reduce the number of germs on your skin by washing with CHG (chlorahexidine gluconate) soap before surgery.  CHG is an antiseptic cleaner which kills germs and bonds with the skin to continue killing germs even after washing. Please DO NOT use if you have an allergy to CHG or  antibacterial soaps.  If your skin becomes reddened/irritated stop using the CHG and inform your nurse when you arrive at Short Stay. Do not shave (including legs and underarms) for at least 48 hours prior to the first CHG shower.  You may shave your face/neck. Please follow these instructions carefully:  1.  Shower with CHG Soap the night before surgery and the  morning of Surgery.  2.  If you choose to wash your hair, wash your hair first as usual with your  normal  shampoo.  3.  After you shampoo, rinse your hair and body thoroughly to remove the  shampoo.                           4.  Use CHG as you would any other liquid soap.  You can apply chg directly  to the skin and wash                       Gently with a scrungie or clean washcloth.  5.  Apply the CHG Soap to your body ONLY FROM THE NECK DOWN.   Do not use on face/ open                           Wound or open sores. Avoid contact with eyes, ears mouth and genitals (private parts).                       Wash face,  Genitals (private parts) with your normal soap.             6.  Wash thoroughly, paying special attention to the area where your surgery  will be performed.  7.  Thoroughly rinse your body with warm water from the neck down.  8.  DO NOT shower/wash with your normal soap after using and rinsing off  the CHG Soap.                9.  Pat yourself dry with a clean towel.            10.  Wear clean pajamas.            11.  Place clean sheets on your bed the night of your first shower and do not  sleep with pets. Day of Surgery : Do not apply any lotions/deodorants the morning of surgery.  Please wear clean clothes to the hospital/surgery center.  FAILURE TO FOLLOW THESE INSTRUCTIONS MAY RESULT IN THE CANCELLATION OF YOUR SURGERY PATIENT SIGNATURE_________________________________  NURSE SIGNATURE__________________________________  ________________________________________________________________________   Calvin Mccormick  An incentive spirometer is a tool that can help keep your lungs clear and active. This tool measures how well you are filling your lungs with each breath. Taking long deep breaths may help reverse or decrease the chance of developing breathing (pulmonary) problems (especially infection) following:  A long period of time when you are unable to move or be active. BEFORE THE PROCEDURE   If the spirometer includes an indicator to show your best effort, your nurse or respiratory therapist will set it to a desired goal.  If possible, sit up straight or lean slightly forward. Try not to slouch.  Hold the incentive spirometer in an upright position. INSTRUCTIONS FOR USE  1. Sit on the edge of your bed if possible, or sit up as far as you can in bed or on a chair. 2. Hold the incentive spirometer in an upright position. 3. Breathe out normally. 4. Place the mouthpiece in your mouth and seal your lips tightly around it. 5. Breathe in slowly and as deeply as possible, raising the piston or the ball toward the top of the column. 6. Hold your breath for 3-5 seconds or for as long as possible. Allow the piston or ball to fall to the bottom of the column. 7. Remove the mouthpiece from your mouth and breathe out normally. 8. Rest for a few seconds and repeat Steps 1 through 7 at least 10 times every 1-2 hours when you are awake. Take your time and take a few normal breaths between deep breaths. 9. The spirometer may include an indicator to show your best effort. Use the indicator as a goal to work toward  during each repetition. 10. After each set of 10 deep breaths, practice coughing to be sure your lungs are clear. If you have an incision (the cut made at the time of surgery), support your incision when coughing by placing a pillow or rolled up towels firmly against it. Once you are able to get out of bed, walk around indoors and cough well. You may stop using the incentive spirometer when  instructed by your caregiver.  RISKS AND COMPLICATIONS  Take your time so you do not get dizzy or light-headed.  If you are in pain, you may need to take or ask for pain medication before doing incentive spirometry. It is harder to take a deep breath if you are having pain. AFTER USE  Rest and breathe slowly and easily.  It can be helpful to keep track of a log of your progress. Your caregiver can provide you with a simple table to help with this. If you are using the spirometer at home, follow these instructions: [place] IF:   You are having difficultly using the spirometer.  You have trouble using the spirometer as often as instructed.  Your pain medication is not giving enough relief while using the spirometer.  You develop fever of 100.5 F (38.1 C) or higher. SEEK IMMEDIATE MEDICAL CARE IF:   You cough up bloody sputum that had not been present before.  You develop fever of 102 F (38.9 C) or greater.  You develop worsening pain at or near the incision site. MAKE SURE YOU:   Understand these instructions.  Will watch your condition.  Will get help right away if you are not doing well or get worse. Document Released: 05/06/2006 Document Revised: 03/18/2011 Document Reviewed: 07/07/2006 [facility] Patient Information 2014 [place], [place].   ________________________________________________________________________

## 2014-05-26 ENCOUNTER — Ambulatory Visit: Payer: Medicare PPO

## 2014-05-26 ENCOUNTER — Other Ambulatory Visit: Payer: Medicare PPO

## 2014-05-26 ENCOUNTER — Encounter (HOSPITAL_COMMUNITY): Payer: Self-pay

## 2014-05-26 ENCOUNTER — Encounter (HOSPITAL_COMMUNITY)
Admission: RE | Admit: 2014-05-26 | Discharge: 2014-05-26 | Disposition: A | Discharge status: Home / Self Care | Payer: Medicare PPO | Source: Ambulatory Visit | Attending: Dentistry | Admitting: Dentistry

## 2014-05-26 ENCOUNTER — Telehealth: Payer: Self-pay | Admitting: *Deleted

## 2014-05-26 DIAGNOSIS — Z7982 Long term (current) use of aspirin: Secondary | ICD-10-CM | POA: Diagnosis not present

## 2014-05-26 DIAGNOSIS — N183 Chronic kidney disease, stage 3 (moderate): Secondary | ICD-10-CM | POA: Diagnosis not present

## 2014-05-26 DIAGNOSIS — D631 Anemia in chronic kidney disease: Secondary | ICD-10-CM | POA: Diagnosis not present

## 2014-05-26 DIAGNOSIS — I129 Hypertensive chronic kidney disease with stage 1 through stage 4 chronic kidney disease, or unspecified chronic kidney disease: Secondary | ICD-10-CM | POA: Diagnosis not present

## 2014-05-26 DIAGNOSIS — D472 Monoclonal gammopathy: Secondary | ICD-10-CM | POA: Diagnosis not present

## 2014-05-26 DIAGNOSIS — Z9221 Personal history of antineoplastic chemotherapy: Secondary | ICD-10-CM | POA: Diagnosis not present

## 2014-05-26 DIAGNOSIS — K045 Chronic apical periodontitis: Secondary | ICD-10-CM | POA: Diagnosis not present

## 2014-05-26 DIAGNOSIS — K0530 Chronic periodontitis, unspecified: Secondary | ICD-10-CM | POA: Diagnosis present

## 2014-05-26 DIAGNOSIS — C9000 Multiple myeloma not having achieved remission: Secondary | ICD-10-CM | POA: Diagnosis not present

## 2014-05-26 DIAGNOSIS — G620 Drug-induced polyneuropathy: Secondary | ICD-10-CM | POA: Diagnosis not present

## 2014-05-26 DIAGNOSIS — Z79899 Other long term (current) drug therapy: Secondary | ICD-10-CM | POA: Diagnosis not present

## 2014-05-26 DIAGNOSIS — K088 Other specified disorders of teeth and supporting structures: Secondary | ICD-10-CM | POA: Diagnosis not present

## 2014-05-26 HISTORY — DX: Other seasonal allergic rhinitis: J30.2

## 2014-05-26 LAB — BASIC METABOLIC PANEL
Anion gap: 10 (ref 5–15)
BUN: 24 mg/dL — ABNORMAL HIGH (ref 6–20)
CHLORIDE: 103 mmol/L (ref 101–111)
CO2: 24 mmol/L (ref 22–32)
CREATININE: 1.73 mg/dL — AB (ref 0.61–1.24)
Calcium: 9.3 mg/dL (ref 8.9–10.3)
GFR calc non Af Amer: 38 mL/min — ABNORMAL LOW (ref >60)
GFR, EST AFRICAN AMERICAN: 44 mL/min — AB (ref >60)
Glucose, Bld: 120 mg/dL — ABNORMAL HIGH (ref 65–99)
Potassium: 4.4 mmol/L (ref 3.5–5.1)
Sodium: 137 mmol/L (ref 135–145)

## 2014-05-26 LAB — CBC
HEMATOCRIT: 32.6 % — AB (ref 39.0–52.0)
Hemoglobin: 10.5 g/dL — ABNORMAL LOW (ref 13.0–17.0)
MCH: 28.2 pg (ref 26.0–34.0)
MCHC: 32.2 g/dL (ref 30.0–36.0)
MCV: 87.4 fL (ref 78.0–100.0)
PLATELETS: 365 10*3/uL (ref 150–400)
RBC: 3.73 MIL/uL — ABNORMAL LOW (ref 4.22–5.81)
RDW: 14.8 % (ref 11.5–15.5)
WBC: 6.5 10*3/uL (ref 4.0–10.5)

## 2014-05-26 NOTE — Progress Notes (Signed)
Consulted Dr. Suzette Battiest , Anesthesia as Dr. Enrique Sack wanted patient to have an Anesthesia consult. Dr. Suzette Battiest stated" that Anesthesia will see patient morning of surgery"!Marland Kitchen

## 2014-05-26 NOTE — Progress Notes (Signed)
   05/26/14 1157  OBSTRUCTIVE SLEEP APNEA  Have you ever been diagnosed with sleep apnea through a sleep study? No  Do you snore loudly (loud enough to be heard through closed doors)?  1  Do you often feel tired, fatigued, or sleepy during the daytime? 0  Has anyone observed you stop breathing during your sleep? 0  Do you have, or are you being treated for high blood pressure? 1  BMI more than 35 kg/m2? 0  Age over 71 years old? 1  Neck circumference greater than 40 cm/16 inches? 1  Gender: 1

## 2014-05-26 NOTE — Telephone Encounter (Signed)
[place] faxed Revlimid refill request.  Request to provider's desk/in-basket for review.

## 2014-05-27 ENCOUNTER — Ambulatory Visit (HOSPITAL_COMMUNITY): Payer: Medicare PPO | Admitting: Anesthesiology

## 2014-05-27 ENCOUNTER — Ambulatory Visit (HOSPITAL_COMMUNITY)
Admission: RE | Admit: 2014-05-27 | Discharge: 2014-05-27 | Disposition: A | Discharge status: Home / Self Care | Payer: Medicare PPO | Source: Ambulatory Visit | Attending: Dentistry | Admitting: Dentistry

## 2014-05-27 ENCOUNTER — Other Ambulatory Visit: Payer: Self-pay | Admitting: *Deleted

## 2014-05-27 ENCOUNTER — Encounter (HOSPITAL_COMMUNITY)
Admission: RE | Disposition: A | Discharge status: Home / Self Care | Payer: Self-pay | Source: Ambulatory Visit | Attending: Dentistry

## 2014-05-27 ENCOUNTER — Encounter (HOSPITAL_COMMUNITY): Payer: Self-pay | Admitting: *Deleted

## 2014-05-27 DIAGNOSIS — K088 Other specified disorders of teeth and supporting structures: Secondary | ICD-10-CM | POA: Insufficient documentation

## 2014-05-27 DIAGNOSIS — N183 Chronic kidney disease, stage 3 (moderate): Secondary | ICD-10-CM | POA: Insufficient documentation

## 2014-05-27 DIAGNOSIS — G620 Drug-induced polyneuropathy: Secondary | ICD-10-CM | POA: Insufficient documentation

## 2014-05-27 DIAGNOSIS — Z9221 Personal history of antineoplastic chemotherapy: Secondary | ICD-10-CM | POA: Insufficient documentation

## 2014-05-27 DIAGNOSIS — K045 Chronic apical periodontitis: Secondary | ICD-10-CM | POA: Insufficient documentation

## 2014-05-27 DIAGNOSIS — I129 Hypertensive chronic kidney disease with stage 1 through stage 4 chronic kidney disease, or unspecified chronic kidney disease: Secondary | ICD-10-CM | POA: Diagnosis not present

## 2014-05-27 DIAGNOSIS — K0530 Chronic periodontitis, unspecified: Secondary | ICD-10-CM

## 2014-05-27 DIAGNOSIS — D631 Anemia in chronic kidney disease: Secondary | ICD-10-CM | POA: Insufficient documentation

## 2014-05-27 DIAGNOSIS — Z7982 Long term (current) use of aspirin: Secondary | ICD-10-CM | POA: Insufficient documentation

## 2014-05-27 DIAGNOSIS — Z79899 Other long term (current) drug therapy: Secondary | ICD-10-CM | POA: Insufficient documentation

## 2014-05-27 DIAGNOSIS — D472 Monoclonal gammopathy: Secondary | ICD-10-CM | POA: Insufficient documentation

## 2014-05-27 DIAGNOSIS — C9000 Multiple myeloma not having achieved remission: Secondary | ICD-10-CM | POA: Insufficient documentation

## 2014-05-27 HISTORY — PX: MULTIPLE EXTRACTIONS WITH ALVEOLOPLASTY: SHX5342

## 2014-05-27 SURGERY — MULTIPLE EXTRACTION WITH ALVEOLOPLASTY
Anesthesia: General | Site: Mouth

## 2014-05-27 MED ORDER — ISOPROPYL ALCOHOL 70 % SOLN
Status: DC | PRN
  Administered 2014-05-27: 1 via TOPICAL

## 2014-05-27 MED ORDER — LIDOCAINE-EPINEPHRINE 2 %-1:100000 IJ SOLN
INTRAMUSCULAR | Status: AC
  Filled 2014-05-27: qty 6.8

## 2014-05-27 MED ORDER — SUGAMMADEX SODIUM 200 MG/2ML IV SOLN
4.0000 mg/kg | Freq: Once | INTRAVENOUS | Status: DC
  Filled 2014-05-27: qty 3

## 2014-05-27 MED ORDER — FENTANYL CITRATE (PF) 100 MCG/2ML IJ SOLN: 25.0000 ug | INTRAMUSCULAR | Status: DC | PRN

## 2014-05-27 MED ORDER — BUPIVACAINE-EPINEPHRINE (PF) 0.5% -1:200000 IJ SOLN
INTRAMUSCULAR | Status: AC
  Filled 2014-05-27: qty 5.4

## 2014-05-27 MED ORDER — LIDOCAINE HCL (PF) 2 % IJ SOLN
INTRAMUSCULAR | Status: DC | PRN
  Administered 2014-05-27: 100 mg via INTRADERMAL

## 2014-05-27 MED ORDER — ISOPROPYL ALCOHOL 70 % SOLN
Status: AC
  Filled 2014-05-27: qty 480

## 2014-05-27 MED ORDER — MIDAZOLAM HCL 2 MG/2ML IJ SOLN
INTRAMUSCULAR | Status: AC
  Filled 2014-05-27: qty 2

## 2014-05-27 MED ORDER — BUPIVACAINE-EPINEPHRINE (PF) 0.5% -1:200000 IJ SOLN
INTRAMUSCULAR | Status: DC | PRN
  Administered 2014-05-27: 3.4 mL

## 2014-05-27 MED ORDER — 0.9 % SODIUM CHLORIDE (POUR BTL) OPTIME
TOPICAL | Status: DC | PRN
  Administered 2014-05-27: 1000 mL

## 2014-05-27 MED ORDER — PROPOFOL 10 MG/ML IV BOLUS
INTRAVENOUS | Status: AC
  Filled 2014-05-27: qty 20

## 2014-05-27 MED ORDER — PROPOFOL 10 MG/ML IV BOLUS
INTRAVENOUS | Status: DC | PRN
  Administered 2014-05-27: 150 mg via INTRAVENOUS

## 2014-05-27 MED ORDER — OXYMETAZOLINE HCL 0.05 % NA SOLN
NASAL | Status: AC
  Filled 2014-05-27: qty 15

## 2014-05-27 MED ORDER — OXYMETAZOLINE HCL 0.05 % NA SOLN
NASAL | Status: DC | PRN
  Administered 2014-05-27: 2 via NASAL

## 2014-05-27 MED ORDER — FENTANYL CITRATE (PF) 100 MCG/2ML IJ SOLN
INTRAMUSCULAR | Status: DC | PRN
  Administered 2014-05-27: 100 ug via INTRAVENOUS
  Administered 2014-05-27: 50 ug via INTRAVENOUS

## 2014-05-27 MED ORDER — CEFAZOLIN SODIUM-DEXTROSE 2-3 GM-% IV SOLR
INTRAVENOUS | Status: AC
  Filled 2014-05-27: qty 50

## 2014-05-27 MED ORDER — MIDAZOLAM HCL 5 MG/5ML IJ SOLN
INTRAMUSCULAR | Status: DC | PRN
  Administered 2014-05-27: 2 mg via INTRAVENOUS

## 2014-05-27 MED ORDER — CEFAZOLIN SODIUM-DEXTROSE 2-3 GM-% IV SOLR
2.0000 g | Freq: Once | INTRAVENOUS | Status: AC
  Administered 2014-05-27: 2 g via INTRAVENOUS

## 2014-05-27 MED ORDER — LACTATED RINGERS IV SOLN: INTRAVENOUS | Status: DC

## 2014-05-27 MED ORDER — DEXAMETHASONE SODIUM PHOSPHATE 10 MG/ML IJ SOLN
INTRAMUSCULAR | Status: AC
  Filled 2014-05-27: qty 1

## 2014-05-27 MED ORDER — OXYCODONE-ACETAMINOPHEN 5-325 MG PO TABS: 1.0000 | ORAL_TABLET | ORAL | Status: DC | PRN

## 2014-05-27 MED ORDER — SUGAMMADEX SODIUM 200 MG/2ML IV SOLN
INTRAVENOUS | Status: DC | PRN
  Administered 2014-05-27: 150 mg via INTRAVENOUS
  Administered 2014-05-27: 50 mg via INTRAVENOUS

## 2014-05-27 MED ORDER — LACTATED RINGERS IV SOLN
INTRAVENOUS | Status: DC | PRN
  Administered 2014-05-27: 07:00:00 via INTRAVENOUS

## 2014-05-27 MED ORDER — LENALIDOMIDE 5 MG PO CAPS: 5.0000 mg | ORAL_CAPSULE | Freq: Every day | ORAL | Status: DC

## 2014-05-27 MED ORDER — FENTANYL CITRATE (PF) 250 MCG/5ML IJ SOLN
INTRAMUSCULAR | Status: AC
  Filled 2014-05-27: qty 5

## 2014-05-27 MED ORDER — ONDANSETRON HCL 4 MG/2ML IJ SOLN
INTRAMUSCULAR | Status: DC | PRN
  Administered 2014-05-27: 4 mg via INTRAVENOUS

## 2014-05-27 MED ORDER — SUCCINYLCHOLINE CHLORIDE 20 MG/ML IJ SOLN
INTRAMUSCULAR | Status: DC | PRN
  Administered 2014-05-27: 100 mg via INTRAVENOUS

## 2014-05-27 MED ORDER — ROCURONIUM BROMIDE 100 MG/10ML IV SOLN
INTRAVENOUS | Status: AC
  Filled 2014-05-27: qty 1

## 2014-05-27 MED ORDER — SODIUM CHLORIDE 0.9 % IJ SOLN
INTRAMUSCULAR | Status: AC
  Filled 2014-05-27: qty 10

## 2014-05-27 MED ORDER — LIDOCAINE HCL (CARDIAC) 20 MG/ML IV SOLN
INTRAVENOUS | Status: AC
  Filled 2014-05-27: qty 5

## 2014-05-27 MED ORDER — OXYCODONE-ACETAMINOPHEN 5-325 MG PO TABS: ORAL_TABLET | ORAL | Status: DC

## 2014-05-27 MED ORDER — EPHEDRINE SULFATE 50 MG/ML IJ SOLN
INTRAMUSCULAR | Status: AC
  Filled 2014-05-27: qty 1

## 2014-05-27 MED ORDER — ROCURONIUM BROMIDE 100 MG/10ML IV SOLN
INTRAVENOUS | Status: DC | PRN
  Administered 2014-05-27: 40 mg via INTRAVENOUS

## 2014-05-27 MED ORDER — LIDOCAINE-EPINEPHRINE 2 %-1:100000 IJ SOLN
INTRAMUSCULAR | Status: DC | PRN
  Administered 2014-05-27: 6.8 mL

## 2014-05-27 MED ORDER — ONDANSETRON HCL 4 MG/2ML IJ SOLN
INTRAMUSCULAR | Status: AC
  Filled 2014-05-27: qty 2

## 2014-05-27 MED ORDER — DEXAMETHASONE SODIUM PHOSPHATE 10 MG/ML IJ SOLN
INTRAMUSCULAR | Status: DC | PRN
  Administered 2014-05-27: 10 mg via INTRAVENOUS

## 2014-05-27 SURGICAL SUPPLY — 27 items
ATTRACTOMAT 16X20 MAGNETIC DRP (DRAPES) ×3 IMPLANT
BAG ZIPLOCK 12X15 (MISCELLANEOUS) IMPLANT
BANDAGE EYE OVAL (MISCELLANEOUS) ×6 IMPLANT
BLADE SURG 15 STRL LF DISP TIS (BLADE) ×2 IMPLANT
BLADE SURG 15 STRL SS (BLADE) ×4
CANNULA VESSEL W/WING WO/VALVE (CANNULA) ×3 IMPLANT
GAUZE SPONGE 4X4 12PLY STRL (GAUZE/BANDAGES/DRESSINGS) IMPLANT
GAUZE SPONGE 4X4 16PLY XRAY LF (GAUZE/BANDAGES/DRESSINGS) ×3 IMPLANT
GLOVE BIOGEL PI IND STRL 6 (GLOVE) ×1 IMPLANT
GLOVE BIOGEL PI INDICATOR 6 (GLOVE) ×2
GLOVE SURG ORTHO 8.0 STRL STRW (GLOVE) ×3 IMPLANT
GLOVE SURG SS PI 6.0 STRL IVOR (GLOVE) ×3 IMPLANT
GOWN STRL REUS W/TWL 2XL LVL3 (GOWN DISPOSABLE) ×3 IMPLANT
GOWN STRL REUS W/TWL LRG LVL3 (GOWN DISPOSABLE) ×3 IMPLANT
KIT BASIN OR (CUSTOM PROCEDURE TRAY) ×3 IMPLANT
NS IRRIG 1000ML POUR BTL (IV SOLUTION) ×3 IMPLANT
PACK EENT SPLIT (PACKS) ×3 IMPLANT
PACKING VAGINAL (PACKING) ×3 IMPLANT
SUCTION FRAZIER 12FR DISP (SUCTIONS) IMPLANT
SUT CHROMIC 3 0 PS 2 (SUTURE) ×12 IMPLANT
SUT CHROMIC 4 0 P 3 18 (SUTURE) IMPLANT
SYR 50ML LL SCALE MARK (SYRINGE) ×3 IMPLANT
TOWEL OR 17X26 10 PK STRL BLUE (TOWEL DISPOSABLE) ×3 IMPLANT
TUBING CONNECTING 10 (TUBING) ×2 IMPLANT
TUBING CONNECTING 10' (TUBING) ×1
WATER STERILE IRR 1500ML POUR (IV SOLUTION) ×3 IMPLANT
YANKAUER SUCT BULB TIP NO VENT (SUCTIONS) ×3 IMPLANT

## 2014-05-27 NOTE — Progress Notes (Signed)
PRE-OPERATIVE NOTE:  05/27/2014   Calvin Mccormick [id number]  VITALS: BP 125/65 mmHg  Pulse 68  Temp(Src) 98 F (36.7 C) (Oral)  Resp 16  Ht 5\' 11"  (1.803 m)  Wt 168 lb (76.204 kg)  BMI 23.44 kg/m2  SpO2 99%  Lab Results  Component Value Date   WBC 6.5 05/26/2014   HGB 10.5* 05/26/2014   HCT 32.6* 05/26/2014   MCV 87.4 05/26/2014   PLT 365 05/26/2014   BMET    Component Value Date/Time   NA 137 05/26/2014 1150   NA 135* 05/19/2014 1035   K 4.4 05/26/2014 1150   K 4.0 05/19/2014 1035   CL 103 05/26/2014 1150   CL 107 06/29/2012 1131   CO2 24 05/26/2014 1150   CO2 23 05/19/2014 1035   GLUCOSE 120* 05/26/2014 1150   GLUCOSE 88 05/19/2014 1035   GLUCOSE 143* 06/29/2012 1131   BUN 24* 05/26/2014 1150   BUN 13.8 05/19/2014 1035   CREATININE 1.73* 05/26/2014 1150   CREATININE 1.5* 05/19/2014 1035   CREATININE 2.03* 12/14/2010 1114   CALCIUM 9.3 05/26/2014 1150   CALCIUM 8.7 05/19/2014 1035   GFRNONAA 38* 05/26/2014 1150   GFRAA 44* 05/26/2014 1150    Lab Results  Component Value Date   INR 1.00 03/03/2014   INR 1.23 07/25/2011   INR 1.17 07/24/2011   No results found for: PTT   Calvin Mccormick presents for extraction of remaining teeth with alveoloplasty and operative room and general anesthesia.    SUBJECTIVE: The patient denies any acute medical or dental changes and agrees to proceed with treatment as planned.  EXAM: No sign of acute dental changes.  ASSESSMENT: Patient is affected by chronic apical periodontitis, chronic periodontitis, and tooth mobility.  PLAN: Patient agrees to proceed with treatment as planned in the operating room as previously discussed and accepts the risks, benefits, and complications of the proposed treatment. Patient is aware of the risk for bleeding, bruising, swelling, infection, pain, nerve damage, soft tissue damage, sinus involvement, root tip fracture, mandible fracture, and the risks of complications associated with the  anesthesia. Patient also is aware of the potential for other complications not mentioned above.   Lenn Cal, DDS

## 2014-05-27 NOTE — Anesthesia Preprocedure Evaluation (Signed)
Anesthesia Evaluation  Patient identified by MRN, date of birth, ID band Patient awake    Reviewed: Allergy & Precautions, H&P , NPO status , Patient's Chart, lab work & pertinent test results  Airway Mallampati: II  TM Distance: >3 FB Neck ROM: full    Dental no notable dental hx.    Pulmonary neg pulmonary ROS,  breath sounds clear to auscultation  Pulmonary exam normal       Cardiovascular Exercise Tolerance: Good hypertension, Pt. on medications Normal cardiovascular examRhythm:regular Rate:Normal     Neuro/Psych Peripheral neuropathy negative neurological ROS  negative psych ROS   GI/Hepatic negative GI ROS, Neg liver ROS,   Endo/Other  negative endocrine ROS  Renal/GU Renal diseaseStage 3 chronic renal disease  negative genitourinary   Musculoskeletal   Abdominal   Peds  Hematology  (+) anemia , IgG monoclonal gammopathy. hgb 10.4   Anesthesia Other Findings Multiple myeloma  Reproductive/Obstetrics negative OB ROS                             Anesthesia Physical Anesthesia Plan  ASA: III  Anesthesia Plan: General   Post-op Pain Management:    Induction: Intravenous  Airway Management Planned: Nasal ETT  Additional Equipment:   Intra-op Plan:   Post-operative Plan: Extubation in OR  Informed Consent: I have reviewed the patients History and Physical, chart, labs and discussed the procedure including the risks, benefits and alternatives for the proposed anesthesia with the patient or authorized representative who has indicated his/her understanding and acceptance.   Dental Advisory Given  Plan Discussed with: CRNA and Surgeon  Anesthesia Plan Comments:         Anesthesia Quick Evaluation

## 2014-05-27 NOTE — Anesthesia Procedure Notes (Signed)
Procedure Name: Intubation Date/Time: 05/27/2014 7:41 AM Performed by: Lollie Sails Pre-anesthesia Checklist: Patient identified, Emergency Drugs available, Suction available, Patient being monitored and Timeout performed Patient Re-evaluated:Patient Re-evaluated prior to inductionOxygen Delivery Method: Circle system utilized Preoxygenation: Pre-oxygenation with 100% oxygen Intubation Type: IV induction Ventilation: Mask ventilation without difficulty Laryngoscope Size: Mac and 4 Grade View: Grade II Nasal Tubes: Nasal Rae, Right, Nasal prep performed and Magill forceps - small, utilized Tube size: 7.5 mm Number of attempts: 1 Placement Confirmation: ETT inserted through vocal cords under direct vision,  positive ETCO2 and breath sounds checked- equal and bilateral Secured at: 28 cm Tube secured with: Tape Dental Injury: Teeth and Oropharynx as per pre-operative assessment

## 2014-05-27 NOTE — Op Note (Signed)
OPERATIVE REPORT  Patient:            Calvin Mccormick Date of Birth:  [birth year] MRN:                [id number]   DATE OF PROCEDURE:  05/27/2014  PREOPERATIVE DIAGNOSES: 1. Multiple myeloma 2. Pre-Zometa therapy dental protocol 3. Chronic apical periodontitis 4. Chronic periodontitis 5. Multiple mobile teeth   POSTOPERATIVE DIAGNOSES: 1. Multiple myeloma 2. Pre-Zometa therapy dental protocol 3. Chronic apical periodontitis 4. Chronic periodontitis 5. Multiple mobile teeth   OPERATIONS: 1. Multiple extraction of tooth numbers 1, 6, 7, 8, 10, 11, 13, 21, 22, 23, 24, 25, 26, 27, and 28. 2. 4 Quadrants of alveoloplasty   SURGEON: Lenn Cal, DDS  ASSISTANT: Camie Patience, (dental assistant)  ANESTHESIA: General anesthesia via nasoendotracheal tube.  MEDICATIONS: 1. Ancef 2 g IV prior to invasive dental procedures. 2. Local anesthesia with a total utilization of 4 carpules each containing 34 mg of lidocaine with 0.017 mg of epinephrine as well as 3 carpules each containing 9 mg of bupivacaine with 0.009 mg of epinephrine.  SPECIMENS: There are 15 teeth that were discarded.  DRAINS: None  CULTURES: None  COMPLICATIONS: None  ESTIMATED BLOOD LOSS: 50 mLs.  INTRAVENOUS FLUIDS: 800 mLs of Lactated ringers solution.  INDICATIONS: The patient was recently diagnosed with multiple myeloma.  A dental consultation was then requested to as part of a medically necessary pre-Zometa therapy dental protocol . The patient was examined and treatment planned for extraction of remaining teeth with alveoloplasty as needed in the operating room with general anesthesia.  This treatment plan was formulated to decrease the risks and complications associated with dental infection from affecting the patient's systemic health and to prevent future complication of drug-induced osteonecrosis of the jaw related to anticipated Zometa therapy.  OPERATIVE FINDINGS: Patient was examined operating  room number 11.  The teeth were identified for extraction. The patient was noted be affected by chronic periodontitis, chronic apical periodontitis, and multiple multiple teeth.  DESCRIPTION OF PROCEDURE: Patient was brought to the main operating room number 11. Patient was then placed in the supine position on the operating table. General anesthesia was then induced per the anesthesia team. The patient was then prepped and draped in the usual manner for dental medicine procedure. A timeout was performed. The patient was identified and procedures were verified. A throat pack was placed at this time. The oral cavity was then thoroughly examined with the findings noted above. The patient was then ready for dental medicine procedure as follows:  Local anesthesia was then administered sequentially with a total utilization of 4 carpules each containing 34 mg of lidocaine with 0.017 mg of epinephrine as well as 3 carpules  each containing 9 mg bupivacaine with 0.009 mg of epinephrine.  The Maxillary left and right quadrants were first approached. Anesthesia was then delivered utilizing infiltration with lidocaine with epinephrine. A #15 blade incision was then made from the maxillary right tuberosity and extended to the distal of #14.  A  surgical flap was then carefully reflected. The teeth were then subluxated with a series of straight elevators. Tooth numbers 1, 6, 7, 8, 10, 11, and 13 were then removed with a 150 forceps without complications. Alveoloplasty was then performed utilizing a ronguers and bone file. The surgical site was then irrigated with copious amounts of sterile saline. The tissues were approximated and trimmed appropriately. The surgical site was then closed from the maxillary right tuberosity  and extended to the mesial #8 utilizing 3-0 chromic gut suture in a continuous interrupted suture technique 1. The maxillary left surgical site was then closed from the distal of #14 and extended to the  mesial #9 utilizing 3-0 chromic gut suture in a continuous interrupted suture technique 1.  At this point time, the mandibular quadrants were approached. The patient was given bilateral inferior alveolar nerve blocks and long buccal nerve blocks utilizing the bupivacaine with epinephrine. Further infiltration was then achieved utilizing the lidocaine with epinephrine. A 15 blade incision was then made from the distal of number 19 and extended to the distal of #30.  A surgical flap was then carefully reflected. Appropriate amounts of buccal and interseptal bone were then removed utilizing a surgical handpiece and copious amount of sterile water around tooth numbers 21, 22, 27, and 28. The lower teeth were then subluxated with a series straight elevators. Tooth numbers 21, 22, 23, 24, 25, 26, 27, 28 were then removed with a 151 forceps without complications. Alveoloplasty was then performed utilizing a rongeurs and bone file. The tissues were approximated and trimmed appropriately. The surgical sites were then irrigated with copious amounts of sterile saline. The mandibular left surgical site was then closed from the distal of 19 and extended the mesial #24 utilizing 3-0 chromic gut suture in a continuous surface suture technique 1. The mandibular right surgical site was then closed from the distal of 30 and extended the mesial #25 utilizing 3-0 chromic gut suture in a continuous interrupted suture technique x1.   At this point time, the entire mouth was irrigated with copious amounts of sterile saline. The patient was examined for complications, seeing none, the dental medicine procedure was deemed to be complete. The throat pack was removed at this time. An oral airway was then placed at the request of the anesthesia team. A series of 4 x 4 gauze were placed in the mouth to aid hemostasis. The patient was then handed over to the anesthesia team for final disposition. After an appropriate amount of time, the  patient was extubated and taken to the postanesthsia care unit in good condition. All counts were correct for the dental medicine procedure. The patient will use Percocet 5/325 taking 1-2 tablets every 4-6 hours as needed for pain. Patient to return to clinic for evaluation for suture removal in 7-10 days. The patient ideally should be allowed to heal approximately 1-2 months to allow for adequate healing prior to administration of Zometa.   Lenn Cal, DDS.

## 2014-05-27 NOTE — H&P (Signed)
05/27/2014  Patient:            Calvin Mccormick Date of Birth:  11/02/1945 MRN:                [id number]   BP 125/65 mmHg  Pulse 68  Temp(Src) 98 F (36.7 C) (Oral)  Resp 16  Ht 5' 11" (1.803 m)  Wt 168 lb (76.204 kg)  BMI 23.44 kg/m2  SpO2 99%   Calvin Mccormick is a 69-year-old male with multiple myeloma that presents for extraction remaining teeth with alveoloplasty in the operative room and general anesthesia. Patient denies any acute medical or dental changes. Please see note from Dr. Gorsuch on 05/20/2014 T use as H&P for the dental operating room procedure. Calvin Mccormick, DDS   [facility] OFFICE PROGRESS NOTE  Patient Care Team: Veita Bland, MD as PCP - General (Family Medicine)  SUMMARY OF ONCOLOGIC HISTORY:   Multiple myeloma   12/12/2010 Initial Diagnosis Multiple myeloma   02/16/2014 Imaging Skeletal survey showed diffuse osteopenia   03/02/2014 Bone Marrow Biopsy Accession: [id number] BM biopsy showed only 5 % plasma cells. However, the biopsy was difficult and the bone was fragmented during the procedure. Cytogenetics and FISH study is normal   03/03/2014 Procedure he has port placement   03/10/2014 - 05/19/2014 Chemotherapy he received elotuzumab and revlimid   05/20/2014 -  Chemotherapy Treatment is switched to maintenance Revlimid only    INTERVAL HISTORY: Please see below for problem oriented charting. He returns prior to his infusion treatment. He is doing well. No side-effects from treatment Has persistent mild neuropathy from prior treatment He complained of fatigue. He denies recent infection. He is not able to see a dentist due to insurance issue  REVIEW OF SYSTEMS:  Constitutional: Denies fevers, chills or abnormal weight loss Eyes: Denies blurriness of vision Ears, nose, mouth, throat, and face: Denies mucositis or sore throat Respiratory: Denies cough, dyspnea or wheezes Cardiovascular: Denies palpitation, chest  discomfort or lower extremity swelling Gastrointestinal: Denies nausea, heartburn or change in bowel habits Skin: Denies abnormal skin rashes Lymphatics: Denies new lymphadenopathy or easy bruising Neurological:Denies numbness, tingling or new weaknesses Behavioral/Psych: Mood is stable, no new changes  All other systems were reviewed with the patient and are negative.  I have reviewed the past medical history, past surgical history, social history and family history with the patient and they are unchanged from previous note.  ALLERGIES: is allergic to lactose intolerance (gi).  MEDICATIONS:  Current Outpatient Prescriptions  Medication Sig Dispense Refill  . allopurinol (ZYLOPRIM) 100 MG tablet Take 100 mg by mouth 2 (two) times daily.     . amLODipine (NORVASC) 10 MG tablet TAKE 1 TABLET BY MOUTH EVERY DAY 30 tablet 1  . aspirin EC 81 MG tablet Take 81 mg by mouth daily.    . atorvastatin (LIPITOR) 20 MG tablet Take 20 mg by mouth at bedtime.     . cholecalciferol (VITAMIN D) 1000 UNITS tablet Take 1,000 Units by mouth daily.    . dorzolamide-timolol (COSOPT) 22.3-6.8 MG/ML ophthalmic solution     . hydrochlorothiazide (HYDRODIURIL) 25 MG tablet TAKE 1 TABLET BY MOUTH EVERY DAY 90 tablet 4  . KLOR-CON M20 20 MEQ tablet TAKE 1 TABLET BY MOUTH EVERY DAY 90 tablet 1  . lenalidomide (REVLIMID) 5 MG capsule Take 1 capsule (5 mg total) by mouth daily. Take 1 capsule daily for 21 days and then rest 7 days 21 capsule 0  . lidocaine-prilocaine (  EMLA) cream Apply 1 application topically as needed. 30 g 1  . loperamide (IMODIUM A-D) 2 MG tablet 1 po after each watery BM. Maximum 6 per 24hrs. 30 tablet PRN  . ondansetron (ZOFRAN) 8 MG tablet Take 1 tablet (8 mg total) by mouth 3 (three) times daily as needed for nausea or vomiting. 30 tablet 1  . PRESCRIPTION MEDICATION [facility] Antibody Plan     No current  facility-administered medications for this visit.    PHYSICAL EXAMINATION: ECOG PERFORMANCE STATUS: 1 - Symptomatic but completely ambulatory  Filed Vitals:   05/19/14 1114  BP: 124/58  Pulse: 73  Temp: 98.1 F (36.7 C)  Resp: 17   Filed Weights   05/19/14 1114  Weight: 169 lb 9.6 oz (76.93 kg)    GENERAL:alert, no distress and comfortable SKIN: skin color, texture, turgor are normal, no rashes or significant lesions EYES: normal, Conjunctiva are pink and non-injected, sclera clear OROPHARYNX:no exudate, no erythema and lips, buccal mucosa, and tongue normal  NECK: supple, thyroid normal size, non-tender, without nodularity LYMPH: no palpable lymphadenopathy in the cervical, axillary or inguinal LUNGS: clear to auscultation and percussion with normal breathing effort HEART: regular rate & rhythm and no murmurs and no lower extremity edema ABDOMEN:abdomen soft, non-tender and normal bowel sounds Musculoskeletal:no cyanosis of digits and no clubbing  NEURO: alert & oriented x 3 with fluent speech, no focal motor/sensory deficits  LABORATORY DATA:  I have reviewed the data as listed  Labs (Brief)       Component Value Date/Time   NA 135* 05/19/2014 1035   NA 133* 04/21/2014 1030   K 4.0 05/19/2014 1035   K 3.7 04/21/2014 1030   CL 102 04/21/2014 1030   CL 107 06/29/2012 1131   CO2 23 05/19/2014 1035   CO2 24 04/21/2014 1030   GLUCOSE 88 05/19/2014 1035   GLUCOSE 76 04/21/2014 1030   GLUCOSE 143* 06/29/2012 1131   BUN 13.8 05/19/2014 1035   BUN 28* 04/21/2014 1030   CREATININE 1.5* 05/19/2014 1035   CREATININE 1.81* 04/21/2014 1030   CREATININE 2.03* 12/14/2010 1114   CALCIUM 8.7 05/19/2014 1035   CALCIUM 8.5 04/21/2014 1030   PROT 6.3* 05/19/2014 1035   PROT 7.1 04/21/2014 1030   ALBUMIN 3.0* 05/19/2014 1035   ALBUMIN 3.4* 04/21/2014 1030   AST  18 05/19/2014 1035   AST 19 04/21/2014 1030   ALT 18 05/19/2014 1035   ALT 22 04/21/2014 1030   ALKPHOS 94 05/19/2014 1035   ALKPHOS 84 04/21/2014 1030   BILITOT 0.47 05/19/2014 1035   BILITOT 0.4 04/21/2014 1030   GFRNONAA 36* 04/21/2014 1030   GFRAA 42* 04/21/2014 1030       Recent Labs    No results found for: SPEP, UPEP     Recent Labs    Lab Results  Component Value Date   WBC 7.5 05/19/2014   NEUTROABS 4.8 05/19/2014   HGB 10.1* 05/19/2014   HCT 30.8* 05/19/2014   MCV 86.2 05/19/2014   PLT 323 05/19/2014       Chemistry    Labs (Brief)       Component Value Date/Time   NA 135* 05/19/2014 1035   NA 133* 04/21/2014 1030   K 4.0 05/19/2014 1035   K 3.7 04/21/2014 1030   CL 102 04/21/2014 1030   CL 107 06/29/2012 1131   CO2 23 05/19/2014 1035   CO2 24 04/21/2014 1030   BUN 13.8 05/19/2014 1035   BUN 28* 04/21/2014 1030     CREATININE 1.5* 05/19/2014 1035   CREATININE 1.81* 04/21/2014 1030   CREATININE 2.03* 12/14/2010 1114      Labs (Brief)       Component Value Date/Time   CALCIUM 8.7 05/19/2014 1035   CALCIUM 8.5 04/21/2014 1030   ALKPHOS 94 05/19/2014 1035   ALKPHOS 84 04/21/2014 1030   AST 18 05/19/2014 1035   AST 19 04/21/2014 1030   ALT 18 05/19/2014 1035   ALT 22 04/21/2014 1030   BILITOT 0.47 05/19/2014 1035   BILITOT 0.4 04/21/2014 1030       ASSESSMENT & PLAN:  Multiple myeloma He has achieved great response to treatment. I did not see any major further improvement with the combination therapy. I recommend discontinuation of Elotuzumab and just go on maintenance Revlimid at 3 weeks on 1 week off He has not obtained dental clearance due to insurance issue. I will refer him to see our dentist here so that we can proceed with Zometa in the future. He will return here  for port flushes to maintain port patency. He will continue on preventive therapy with aspirin, calcium and Vitamin D   Anemia in chronic renal disease This is likely anemia of chronic disease from renal failure and relapsed myeloma. The patient denies recent history of bleeding such as epistaxis, hematuria or hematochezia. He is asymptomatic from the anemia. We will observe for now. He does not require transfusion now.        Chronic renal insufficiency, stage III (moderate) This is chronic in nature. It is stable. Recommend observation only.   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 For this reason, I would not recommend Velcade. Continue close observation.     Orders Placed This Encounter  Procedures  . Ambulatory referral to Dentistry    Referral Priority:  Routine    Referral Type:  Consultation    Referral Reason:  Specialty Services Required    Requested Specialty:  Dental General Practice    Number of Visits Requested:  1   All questions were answered. The patient knows to call the clinic with any problems, questions or concerns. No barriers to learning was detected. I spent 25 minutes counseling the patient face to face. The total time spent in the appointment was 30 minutes and more than 50% was on counseling and review of test results   [facility], [place], MD 05/20/2014 7:39 PM

## 2014-05-27 NOTE — Anesthesia Postprocedure Evaluation (Signed)
  Anesthesia Post-op Note  Patient: Calvin Mccormick  Procedure(s) Performed: Procedure(s) (LRB): Extraction of tooth #'s 1,6,7,8,10,11,13,21,22,23,24,25,26,27 and 28 with alveoloplasty (N/A)  Patient Location: PACU  Anesthesia Type: General  Level of Consciousness: awake and alert   Airway and Oxygen Therapy: Patient Spontanous Breathing  Post-op Pain: mild  Post-op Assessment: Post-op Vital signs reviewed, Patient's Cardiovascular Status Stable, Respiratory Function Stable, Patent Airway and No signs of Nausea or vomiting  Last Vitals:  Filed Vitals:   05/27/14 0959  BP: 133/61  Pulse: 53  Temp: 36.6 C  Resp: 14    Post-op Vital Signs: stable   Complications: No apparent anesthesia complications

## 2014-05-27 NOTE — Discharge Instructions (Signed)

## 2014-05-27 NOTE — Transfer of Care (Signed)
Immediate Anesthesia Transfer of Care Note  Patient: Calvin Mccormick  Procedure(s) Performed: Procedure(s): Extraction of tooth #'s 1,6,7,8,10,11,13,21,22,23,24,25,26,27 and 28 with alveoloplasty (N/A)  Patient Location: PACU  Anesthesia Type:General  Level of Consciousness: awake, oriented and patient cooperative  Airway & Oxygen Therapy: Patient Spontanous Breathing and Patient connected to face mask oxygen  Post-op Assessment: Report given to RN and Post -op Vital signs reviewed and stable  Post vital signs: Reviewed and stable  Last Vitals:  Filed Vitals:   05/27/14 0542  BP: 125/65  Pulse: 68  Temp: 36.7 C  Resp: 16    Complications: No apparent anesthesia complications

## 2014-05-30 ENCOUNTER — Encounter (HOSPITAL_COMMUNITY): Payer: Self-pay | Admitting: Dentistry

## 2014-06-02 ENCOUNTER — Telehealth: Payer: Self-pay | Admitting: *Deleted

## 2014-06-02 ENCOUNTER — Ambulatory Visit: Payer: Medicare PPO

## 2014-06-02 ENCOUNTER — Other Ambulatory Visit: Payer: Medicare PPO

## 2014-06-02 NOTE — Telephone Encounter (Signed)
Revlimid scheduled to be shipped 06/02/14

## 2014-06-07 ENCOUNTER — Encounter (HOSPITAL_COMMUNITY): Payer: Self-pay | Admitting: Dentistry

## 2014-06-07 ENCOUNTER — Ambulatory Visit (HOSPITAL_COMMUNITY): Payer: Self-pay | Admitting: Dentistry

## 2014-06-07 VITALS — BP 130/54 | HR 63 | Temp 98.4°F

## 2014-06-07 DIAGNOSIS — C9000 Multiple myeloma not having achieved remission: Secondary | ICD-10-CM

## 2014-06-07 DIAGNOSIS — K08109 Complete loss of teeth, unspecified cause, unspecified class: Secondary | ICD-10-CM

## 2014-06-07 DIAGNOSIS — K0820 Unspecified atrophy of edentulous alveolar ridge: Secondary | ICD-10-CM

## 2014-06-07 NOTE — Patient Instructions (Addendum)
PLAN: 1. Continue salt water rinses as needed to aid healing. 2. Follow-up with a dentist of his choice for fabrication of upper and lower complete dentures after adequate healing. 3. Do not start Zometa until an additional 4-6 weeks of healing.   Lenn Cal, DDS

## 2014-06-07 NOTE — Progress Notes (Signed)
POST OPERATIVE NOTE:  06/07/2014   Bennie Chirico Ericksen [id number]  VITALS: BP 130/54 mmHg  Pulse 63  Temp(Src) 98.4 F (36.9 C) (Oral)  LABS:  Lab Results  Component Value Date   WBC 6.5 05/26/2014   HGB 10.5* 05/26/2014   HCT 32.6* 05/26/2014   MCV 87.4 05/26/2014   PLT 365 05/26/2014   BMET    Component Value Date/Time   NA 137 05/26/2014 1150   NA 135* 05/19/2014 1035   K 4.4 05/26/2014 1150   K 4.0 05/19/2014 1035   CL 103 05/26/2014 1150   CL 107 06/29/2012 1131   CO2 24 05/26/2014 1150   CO2 23 05/19/2014 1035   GLUCOSE 120* 05/26/2014 1150   GLUCOSE 88 05/19/2014 1035   GLUCOSE 143* 06/29/2012 1131   BUN 24* 05/26/2014 1150   BUN 13.8 05/19/2014 1035   CREATININE 1.73* 05/26/2014 1150   CREATININE 1.5* 05/19/2014 1035   CREATININE 2.03* 12/14/2010 1114   CALCIUM 9.3 05/26/2014 1150   CALCIUM 8.7 05/19/2014 1035   GFRNONAA 38* 05/26/2014 1150   GFRAA 44* 05/26/2014 1150    Lab Results  Component Value Date   INR 1.00 03/03/2014   INR 1.23 07/25/2011   INR 1.17 07/24/2011   No results found for: PTT   Yohan J Brevik is status post extraction remaining teeth with alveoloplasty in the operating room with general anesthesia on 05/27/2014. Patient now presents for evaluation of healing and suture removal.  SUBJECTIVE: Patient is having minimal oral discomfort. Patient is using salt water rinses as needed. Patient has several stitches that remain.   EXAM: There is no sign of infection, heme, or ooze. Sutures are loosely intact. Patient has generalized primary closure. Patient is now edentulous. There is atrophy of the edentulous alveolar ridges.  PROCEDURE: The patient was given a chlorhexidine gluconate rinse for 30 seconds. Sutures were then removed without complication. Patient tolerated the procedure well.  ASSESSMENT: Post operative course is consistent with dental procedures performed in the OR. The patient is edentulous. There is atrophy of the  edentulous alveolar ridges.  PLAN: 1. Continue salt water rinses as needed to aid healing. 2. Follow-up Dental Medicine for fabrication of upper and lower complete dentures after adequate healing. 3. Do not start Zometa until an additional 4-6 weeks of healing.   Lenn Cal, DDS

## 2014-06-16 ENCOUNTER — Ambulatory Visit (HOSPITAL_BASED_OUTPATIENT_CLINIC_OR_DEPARTMENT_OTHER): Payer: Medicare PPO

## 2014-06-16 ENCOUNTER — Other Ambulatory Visit (HOSPITAL_BASED_OUTPATIENT_CLINIC_OR_DEPARTMENT_OTHER): Payer: Medicare PPO

## 2014-06-16 ENCOUNTER — Other Ambulatory Visit: Payer: Self-pay | Admitting: *Deleted

## 2014-06-16 ENCOUNTER — Encounter: Payer: Self-pay | Admitting: Hematology and Oncology

## 2014-06-16 ENCOUNTER — Telehealth: Payer: Self-pay | Admitting: Hematology and Oncology

## 2014-06-16 ENCOUNTER — Ambulatory Visit (HOSPITAL_BASED_OUTPATIENT_CLINIC_OR_DEPARTMENT_OTHER): Payer: Medicare PPO | Admitting: Hematology and Oncology

## 2014-06-16 VITALS — BP 125/76 | HR 65 | Temp 97.9°F | Resp 17 | Ht 71.0 in | Wt 167.1 lb

## 2014-06-16 DIAGNOSIS — N183 Chronic kidney disease, stage 3 unspecified: Secondary | ICD-10-CM

## 2014-06-16 DIAGNOSIS — D631 Anemia in chronic kidney disease: Secondary | ICD-10-CM

## 2014-06-16 DIAGNOSIS — C9000 Multiple myeloma not having achieved remission: Secondary | ICD-10-CM

## 2014-06-16 DIAGNOSIS — N189 Chronic kidney disease, unspecified: Secondary | ICD-10-CM

## 2014-06-16 DIAGNOSIS — Z95828 Presence of other vascular implants and grafts: Secondary | ICD-10-CM

## 2014-06-16 LAB — CBC WITH DIFFERENTIAL/PLATELET
BASO%: 2.4 % — AB (ref 0.0–2.0)
Basophils Absolute: 0.2 10*3/uL — ABNORMAL HIGH (ref 0.0–0.1)
EOS ABS: 0.5 10*3/uL (ref 0.0–0.5)
EOS%: 6.2 % (ref 0.0–7.0)
HCT: 30.1 % — ABNORMAL LOW (ref 38.4–49.9)
HGB: 10.2 g/dL — ABNORMAL LOW (ref 13.0–17.1)
LYMPH%: 36.3 % (ref 14.0–49.0)
MCH: 28.9 pg (ref 27.2–33.4)
MCHC: 33.9 g/dL (ref 32.0–36.0)
MCV: 85.5 fL (ref 79.3–98.0)
MONO#: 0.7 10*3/uL (ref 0.1–0.9)
MONO%: 9 % (ref 0.0–14.0)
NEUT#: 3.4 10*3/uL (ref 1.5–6.5)
NEUT%: 46.1 % (ref 39.0–75.0)
Platelets: 378 10*3/uL (ref 140–400)
RBC: 3.53 10*6/uL — AB (ref 4.20–5.82)
RDW: 17 % — ABNORMAL HIGH (ref 11.0–14.6)
WBC: 7.4 10*3/uL (ref 4.0–10.3)
lymph#: 2.7 10*3/uL (ref 0.9–3.3)

## 2014-06-16 LAB — COMPREHENSIVE METABOLIC PANEL (CC13)
ALT: 11 U/L (ref 0–55)
AST: 22 U/L (ref 5–34)
Albumin: 3.2 g/dL — ABNORMAL LOW (ref 3.5–5.0)
Alkaline Phosphatase: 109 U/L (ref 40–150)
Anion Gap: 7 mEq/L (ref 3–11)
BILIRUBIN TOTAL: 0.52 mg/dL (ref 0.20–1.20)
BUN: 13.6 mg/dL (ref 7.0–26.0)
CALCIUM: 9 mg/dL (ref 8.4–10.4)
CO2: 23 mEq/L (ref 22–29)
Chloride: 106 mEq/L (ref 98–109)
Creatinine: 1.5 mg/dL — ABNORMAL HIGH (ref 0.7–1.3)
EGFR: 55 mL/min/{1.73_m2} — ABNORMAL LOW (ref >90)
Glucose: 89 mg/dl (ref 70–140)
Potassium: 4.3 mEq/L (ref 3.5–5.1)
Sodium: 136 mEq/L (ref 136–145)
Total Protein: 6.8 g/dL (ref 6.4–8.3)

## 2014-06-16 MED ORDER — SODIUM CHLORIDE 0.9 % IJ SOLN
10.0000 mL | INTRAMUSCULAR | Status: DC | PRN
  Administered 2014-06-16: 10 mL via INTRAVENOUS
  Filled 2014-06-16: qty 10

## 2014-06-16 MED ORDER — LENALIDOMIDE 10 MG PO CAPS: 10.0000 mg | ORAL_CAPSULE | Freq: Every day | ORAL | Status: DC

## 2014-06-16 MED ORDER — HEPARIN SOD (PORK) LOCK FLUSH 100 UNIT/ML IV SOLN
500.0000 [IU] | Freq: Once | INTRAVENOUS | Status: AC
  Administered 2014-06-16: 500 [IU] via INTRAVENOUS
  Filled 2014-06-16: qty 5

## 2014-06-16 NOTE — Progress Notes (Signed)
[facility] OFFICE PROGRESS NOTE  Patient Care Team: Lucianne Lei, MD as PCP - General (Family Medicine)  SUMMARY OF ONCOLOGIC HISTORY:   Multiple myeloma   12/12/2010 Initial Diagnosis Multiple myeloma   02/16/2014 Imaging Skeletal survey showed diffuse osteopenia   03/02/2014 Bone Marrow Biopsy Accession: [id number] BM biopsy showed only 5 % plasma cells. However, the biopsy was difficult and the bone was fragmented during the procedure. Cytogenetics and FISH study is normal   03/03/2014 Procedure he has port placement   03/10/2014 - 05/19/2014 Chemotherapy he received elotuzumab and revlimid   05/20/2014 -  Chemotherapy Treatment is switched to maintenance Revlimid only    INTERVAL HISTORY: Please see below for problem oriented charting. He feels well. Denies any bone pain. He had recent dental extraction and his mouth is healing Denies recent fevers or chills.  REVIEW OF SYSTEMS:   Constitutional: Denies fevers, chills or abnormal weight loss Eyes: Denies blurriness of vision Ears, nose, mouth, throat, and face: Denies mucositis or sore throat Respiratory: Denies cough, dyspnea or wheezes Cardiovascular: Denies palpitation, chest discomfort or lower extremity swelling Gastrointestinal:  Denies nausea, heartburn or change in bowel habits Skin: Denies abnormal skin rashes Lymphatics: Denies new lymphadenopathy or easy bruising Neurological:Denies numbness, tingling or new weaknesses Behavioral/Psych: Mood is stable, no new changes  All other systems were reviewed with the patient and are negative.  I have reviewed the past medical history, past surgical history, social history and family history with the patient and they are unchanged from previous note.  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  MEDICATIONS:  Current Outpatient Prescriptions  Medication Sig Dispense Refill  . allopurinol (ZYLOPRIM) 100 MG tablet Take 100 mg by mouth 2 (two) times daily.      Marland Kitchen amLODipine (NORVASC) 10 MG tablet TAKE 1 TABLET BY MOUTH EVERY DAY 30 tablet 1  . aspirin EC 81 MG tablet Take 81 mg by mouth daily.    Marland Kitchen atorvastatin (LIPITOR) 20 MG tablet Take 20 mg by mouth at bedtime.     . brimonidine (ALPHAGAN) 0.15 % ophthalmic solution Place 1 drop into the right eye 3 (three) times daily.    . cholecalciferol (VITAMIN D) 1000 UNITS tablet Take 1,000 Units by mouth daily.    . dorzolamide-timolol (COSOPT) 22.3-6.8 MG/ML ophthalmic solution Place 1 drop into both eyes 2 (two) times daily.     . fluorometholone (FML) 0.1 % ophthalmic suspension Place 1 drop into both eyes daily.    . hydrochlorothiazide (HYDRODIURIL) 25 MG tablet TAKE 1 TABLET BY MOUTH EVERY DAY 90 tablet 4  . KLOR-CON M20 20 MEQ tablet TAKE 1 TABLET BY MOUTH EVERY DAY 90 tablet 1  . latanoprost (XALATAN) 0.005 % ophthalmic solution Place 1 drop into the right eye at bedtime.    . lidocaine-prilocaine (EMLA) cream Apply 1 application topically as needed. 30 g 1  . loperamide (IMODIUM A-D) 2 MG tablet 1 po after each watery BM.  Maximum 6 per 24hrs. 30 tablet PRN  . PRESCRIPTION MEDICATION [facility] Antibody Plan    . lenalidomide (REVLIMID) 10 MG capsule Take 1 capsule (10 mg total) by mouth daily. Take 1 capsule daily for 21 days and then rest 7 days 21 capsule 0  . ondansetron (ZOFRAN) 8 MG tablet Take 1 tablet (8 mg total) by mouth 3 (three) times daily as needed for nausea or vomiting. (Patient not taking: Reported on 06/16/2014) 30 tablet 1  . oxyCODONE-acetaminophen (PERCOCET) 5-325 MG per tablet Take  one or two tablets by mouth every 4-6 hours as needed for pain. (Patient not taking: Reported on 06/16/2014) 40 tablet 0   No current facility-administered medications for this visit.    PHYSICAL EXAMINATION: ECOG PERFORMANCE STATUS: 0 - Asymptomatic  Filed Vitals:   06/16/14 1106  BP: 125/76  Pulse: 65  Temp: 97.9 F (36.6 C)  Resp: 17   Filed Weights   06/16/14 1106  Weight: 167 lb 1.6 oz  (75.796 kg)    GENERAL:alert, no distress and comfortable SKIN: skin color, texture, turgor are normal, no rashes or significant lesions EYES: normal, Conjunctiva are pink and non-injected, sclera clear OROPHARYNX:no exudate, no erythema and lips, buccal mucosa, and tongue normal  NECK: supple, thyroid normal size, non-tender, without nodularity LYMPH:  no palpable lymphadenopathy in the cervical, axillary or inguinal LUNGS: clear to auscultation and percussion with normal breathing effort HEART: regular rate & rhythm and no murmurs and no lower extremity edema ABDOMEN:abdomen soft, non-tender and normal bowel sounds Musculoskeletal:no cyanosis of digits and no clubbing  NEURO: alert & oriented x 3 with fluent speech, no focal motor/sensory deficits  LABORATORY DATA:  I have reviewed the data as listed    Component Value Date/Time   NA 136 06/16/2014 1046   NA 137 05/26/2014 1150   K 4.3 06/16/2014 1046   K 4.4 05/26/2014 1150   CL 103 05/26/2014 1150   CL 107 06/29/2012 1131   CO2 23 06/16/2014 1046   CO2 24 05/26/2014 1150   GLUCOSE 89 06/16/2014 1046   GLUCOSE 120* 05/26/2014 1150   GLUCOSE 143* 06/29/2012 1131   BUN 13.6 06/16/2014 1046   BUN 24* 05/26/2014 1150   CREATININE 1.5* 06/16/2014 1046   CREATININE 1.73* 05/26/2014 1150   CREATININE 2.03* 12/14/2010 1114   CALCIUM 9.0 06/16/2014 1046   CALCIUM 9.3 05/26/2014 1150   PROT 6.8 06/16/2014 1046   PROT 7.1 04/21/2014 1030   ALBUMIN 3.2* 06/16/2014 1046   ALBUMIN 3.4* 04/21/2014 1030   AST 22 06/16/2014 1046   AST 19 04/21/2014 1030   ALT 11 06/16/2014 1046   ALT 22 04/21/2014 1030   ALKPHOS 109 06/16/2014 1046   ALKPHOS 84 04/21/2014 1030   BILITOT 0.52 06/16/2014 1046   BILITOT 0.4 04/21/2014 1030   GFRNONAA 38* 05/26/2014 1150   GFRAA 44* 05/26/2014 1150    No results found for: SPEP, UPEP  Lab Results  Component Value Date   WBC 7.4 06/16/2014   NEUTROABS 3.4 06/16/2014   HGB 10.2* 06/16/2014    HCT 30.1* 06/16/2014   MCV 85.5 06/16/2014   PLT 378 06/16/2014      Chemistry      Component Value Date/Time   NA 136 06/16/2014 1046   NA 137 05/26/2014 1150   K 4.3 06/16/2014 1046   K 4.4 05/26/2014 1150   CL 103 05/26/2014 1150   CL 107 06/29/2012 1131   CO2 23 06/16/2014 1046   CO2 24 05/26/2014 1150   BUN 13.6 06/16/2014 1046   BUN 24* 05/26/2014 1150   CREATININE 1.5* 06/16/2014 1046   CREATININE 1.73* 05/26/2014 1150   CREATININE 2.03* 12/14/2010 1114      Component Value Date/Time   CALCIUM 9.0 06/16/2014 1046   CALCIUM 9.3 05/26/2014 1150   ALKPHOS 109 06/16/2014 1046   ALKPHOS 84 04/21/2014 1030   AST 22 06/16/2014 1046   AST 19 04/21/2014 1030   ALT 11 06/16/2014 1046   ALT 22 04/21/2014 1030   BILITOT 0.52  06/16/2014 1046   BILITOT 0.4 04/21/2014 1030     ASSESSMENT & PLAN:  Multiple myeloma He has achieved great response to treatment. I did not see any major further improvement with the combination therapy. He is currently on maintenance Revlimid at 3 weeks on 1 week off I will proceed to increase the Revlimid to 10 mg next cycle and recheck blood work at the end of the month to assess response to treatment He has not obtained dental clearance due to recent surgery  Clinically, he would be ready when I see him back early July. He will return here for port flushes to maintain port patency. He will continue on preventive therapy with aspirin, calcium and Vitamin D  Anemia in chronic renal disease This is likely anemia of chronic disease from renal failure and relapsed myeloma. The patient denies recent history of bleeding such as epistaxis, hematuria or hematochezia. He is asymptomatic from the anemia. We will observe for now.  He does not require transfusion now.      Chronic renal insufficiency, stage III (moderate) This is chronic in nature. It is stable. Recommend observation only. I plan to give him Zometa next month after dental clearance with  reduced dose due to chronic renal failure    Orders Placed This Encounter  Procedures  . CBC with Differential/Platelet    Standing Status: Future     Number of Occurrences:      Standing Expiration Date: 07/21/2015  . Comprehensive metabolic panel    Standing Status: Future     Number of Occurrences:      Standing Expiration Date: 07/21/2015  . Lactate dehydrogenase    Standing Status: Future     Number of Occurrences:      Standing Expiration Date: 07/21/2015  . SPEP & IFE with QIG    Standing Status: Future     Number of Occurrences:      Standing Expiration Date: 07/21/2015  . Kappa/lambda light chains    Standing Status: Future     Number of Occurrences:      Standing Expiration Date: 07/21/2015  . Beta 2 microglobulin, serum    Standing Status: Future     Number of Occurrences:      Standing Expiration Date: 07/21/2015   All questions were answered. The patient knows to call the clinic with any problems, questions or concerns. No barriers to learning was detected. I spent 25 minutes counseling the patient face to face. The total time spent in the appointment was 30 minutes and more than 50% was on counseling and review of test results     [facility], [place], MD 06/16/2014 1:45 PM

## 2014-06-16 NOTE — Assessment & Plan Note (Signed)
He has achieved great response to treatment. I did not see any major further improvement with the combination therapy. He is currently on maintenance Revlimid at 3 weeks on 1 week off I will proceed to increase the Revlimid to 10 mg next cycle and recheck blood work at the end of the month to assess response to treatment He has not obtained dental clearance due to recent surgery  Clinically, he would be ready when I see him back early July. He will return here for port flushes to maintain port patency. He will continue on preventive therapy with aspirin, calcium and Vitamin D

## 2014-06-16 NOTE — Assessment & Plan Note (Signed)
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 The patient denies recent history of bleeding such as epistaxis, hematuria or hematochezia. He is asymptomatic from the anemia. We will observe for now.  He does not require transfusion now.

## 2014-06-16 NOTE — Assessment & Plan Note (Signed)
This is chronic in nature. It is stable. Recommend observation only. I plan to give him Zometa next month after dental clearance with reduced dose due to chronic renal failure

## 2014-06-16 NOTE — Telephone Encounter (Signed)
Gave and printed appt sched and avs for pt for June and July  °

## 2014-06-16 NOTE — Patient Instructions (Signed)

## 2014-06-27 ENCOUNTER — Other Ambulatory Visit: Payer: Self-pay | Admitting: *Deleted

## 2014-06-27 ENCOUNTER — Telehealth: Payer: Self-pay

## 2014-06-27 ENCOUNTER — Encounter: Payer: Self-pay | Admitting: *Deleted

## 2014-06-27 MED ORDER — LENALIDOMIDE 10 MG PO CAPS: 10.0000 mg | ORAL_CAPSULE | Freq: Every day | ORAL | Status: DC

## 2014-06-27 NOTE — Telephone Encounter (Signed)
[facility] pharmacy called for verification of increase in revlimid form 5 mg to 10 mg. Per OV note on 06/16/14 the dose is increased to 10 mg.

## 2014-07-05 ENCOUNTER — Encounter (HOSPITAL_COMMUNITY): Payer: Self-pay | Admitting: Dentistry

## 2014-07-05 ENCOUNTER — Telehealth: Payer: Self-pay | Admitting: Hematology and Oncology

## 2014-07-05 ENCOUNTER — Encounter (INDEPENDENT_AMBULATORY_CARE_PROVIDER_SITE_OTHER): Payer: Self-pay

## 2014-07-05 ENCOUNTER — Ambulatory Visit (HOSPITAL_COMMUNITY): Payer: Self-pay | Admitting: Dentistry

## 2014-07-05 ENCOUNTER — Other Ambulatory Visit: Payer: Self-pay | Admitting: Hematology and Oncology

## 2014-07-05 VITALS — BP 119/54 | HR 53 | Temp 98.3°F

## 2014-07-05 DIAGNOSIS — K08109 Complete loss of teeth, unspecified cause, unspecified class: Secondary | ICD-10-CM

## 2014-07-05 DIAGNOSIS — Z463 Encounter for fitting and adjustment of dental prosthetic device: Secondary | ICD-10-CM

## 2014-07-05 DIAGNOSIS — C9000 Multiple myeloma not having achieved remission: Secondary | ICD-10-CM

## 2014-07-05 DIAGNOSIS — K0820 Unspecified atrophy of edentulous alveolar ridge: Secondary | ICD-10-CM

## 2014-07-05 NOTE — Patient Instructions (Signed)
Return to clinic as scheduled for continued upper and lower complete denture fabrication. Dr. Wylan Gentzler 

## 2014-07-05 NOTE — Telephone Encounter (Signed)
s.w pt and advised on cx appt moved to 7.7 and 7.21...Marland Kitchenpt ok adn aware

## 2014-07-05 NOTE — Progress Notes (Signed)
07/05/2014  Patient Name:   Calvin Mccormick Date of Birth:   03-01-1945 Medical Record Number: [id number]  BP 119/54 mmHg  Pulse 53  Temp(Src) 98.3 F (36.8 C) (Oral)  [name] presents for start of upper and lower denture fabrication. Patient had dental extractions on 05/27/2014 with plan to with hold Zometa therapy until approximately 07/27/2014.  This was discussed with Dr. Alvy Bimler. Dr. Alvy Bimler to continue Revlimid therapy at this time.  Exam: Patient is edentulous. Atrophy of edentulous alveolar ridges is noted. There is no sign of delayed healing. Discussed procedures involved in upper and lower denture fabrication and prognosis for successful ability to wear dentures. Price for dentures confirmed.  Patient agrees to proceed with upper and lower denture fabrication.  Procedure:  Upper and lower denture primary impressions in alginate. Lab pour. To [facility] for upper and lower denture custom tray fabrication. RTC for upper and lower denture final impressions.   Lenn Cal, DDS

## 2014-07-06 ENCOUNTER — Ambulatory Visit (HOSPITAL_COMMUNITY): Payer: Self-pay | Admitting: Dentistry

## 2014-07-06 ENCOUNTER — Other Ambulatory Visit: Payer: Self-pay

## 2014-07-13 ENCOUNTER — Ambulatory Visit: Payer: Self-pay | Admitting: Hematology and Oncology

## 2014-07-13 ENCOUNTER — Ambulatory Visit: Payer: Self-pay

## 2014-07-14 ENCOUNTER — Ambulatory Visit (HOSPITAL_COMMUNITY): Payer: Self-pay | Admitting: Dentistry

## 2014-07-14 ENCOUNTER — Other Ambulatory Visit (HOSPITAL_BASED_OUTPATIENT_CLINIC_OR_DEPARTMENT_OTHER): Payer: Medicare PPO

## 2014-07-14 ENCOUNTER — Encounter (HOSPITAL_COMMUNITY): Payer: Self-pay | Admitting: Dentistry

## 2014-07-14 VITALS — BP 123/56 | HR 55 | Temp 97.7°F

## 2014-07-14 DIAGNOSIS — Z463 Encounter for fitting and adjustment of dental prosthetic device: Secondary | ICD-10-CM

## 2014-07-14 DIAGNOSIS — C9000 Multiple myeloma not having achieved remission: Secondary | ICD-10-CM

## 2014-07-14 DIAGNOSIS — K0820 Unspecified atrophy of edentulous alveolar ridge: Secondary | ICD-10-CM

## 2014-07-14 DIAGNOSIS — K08109 Complete loss of teeth, unspecified cause, unspecified class: Secondary | ICD-10-CM

## 2014-07-14 LAB — CBC WITH DIFFERENTIAL/PLATELET
BASO%: 1.2 % (ref 0.0–2.0)
Basophils Absolute: 0.1 10*3/uL (ref 0.0–0.1)
EOS%: 2.6 % (ref 0.0–7.0)
Eosinophils Absolute: 0.2 10*3/uL (ref 0.0–0.5)
HEMATOCRIT: 30.7 % — AB (ref 38.4–49.9)
HGB: 10 g/dL — ABNORMAL LOW (ref 13.0–17.1)
LYMPH%: 35.2 % (ref 14.0–49.0)
MCH: 28.9 pg (ref 27.2–33.4)
MCHC: 32.7 g/dL (ref 32.0–36.0)
MCV: 88.5 fL (ref 79.3–98.0)
MONO#: 0.6 10*3/uL (ref 0.1–0.9)
MONO%: 7.3 % (ref 0.0–14.0)
NEUT#: 4.4 10*3/uL (ref 1.5–6.5)
NEUT%: 53.7 % (ref 39.0–75.0)
Platelets: 366 10*3/uL (ref 140–400)
RBC: 3.47 10*6/uL — AB (ref 4.20–5.82)
RDW: 18.4 % — ABNORMAL HIGH (ref 11.0–14.6)
WBC: 8.3 10*3/uL (ref 4.0–10.3)
lymph#: 2.9 10*3/uL (ref 0.9–3.3)

## 2014-07-14 LAB — COMPREHENSIVE METABOLIC PANEL (CC13)
ALBUMIN: 3.7 g/dL (ref 3.5–5.0)
ALK PHOS: 104 U/L (ref 40–150)
ALT: 14 U/L (ref 0–55)
AST: 20 U/L (ref 5–34)
Anion Gap: 7 mEq/L (ref 3–11)
BUN: 17.9 mg/dL (ref 7.0–26.0)
CALCIUM: 9.7 mg/dL (ref 8.4–10.4)
CHLORIDE: 108 meq/L (ref 98–109)
CO2: 23 mEq/L (ref 22–29)
Creatinine: 1.7 mg/dL — ABNORMAL HIGH (ref 0.7–1.3)
EGFR: 47 mL/min/{1.73_m2} — ABNORMAL LOW (ref >90)
GLUCOSE: 79 mg/dL (ref 70–140)
Potassium: 4.4 mEq/L (ref 3.5–5.1)
SODIUM: 138 meq/L (ref 136–145)
Total Bilirubin: 0.51 mg/dL (ref 0.20–1.20)
Total Protein: 7.4 g/dL (ref 6.4–8.3)

## 2014-07-14 LAB — LACTATE DEHYDROGENASE (CC13): LDH: 165 U/L (ref 125–245)

## 2014-07-14 NOTE — Progress Notes (Signed)
07/14/2014  Patient Name:   Calvin Mccormick Date of Birth:   1945/07/19 Medical Record Number: [id number]  BP 123/56 mmHg  Pulse 55  Temp(Src) 97.7 F (36.5 C) (Oral)  Tracker J Gill presents for continued upper and lower complete denture fabrication.  Procedure:  Upper and lower border molding and final impressions in Aquasil. Patient tolerated procedure well. To [facility] for custom baseplates with rims. Return to clinic for upper and lower complete denture jaw relations.  Lenn Cal, DDS

## 2014-07-14 NOTE — Patient Instructions (Signed)
Return to clinic as scheduled for continued upper and lower complete denture fabrication. Dr. Kulinski 

## 2014-07-18 LAB — SPEP & IFE WITH QIG
ALPHA-1-GLOBULIN: 0.5 g/dL — AB (ref 0.2–0.3)
Abnormal Protein Band1: 1 g/dL
Albumin ELP: 3.7 g/dL — ABNORMAL LOW (ref 3.8–4.8)
Alpha-2-Globulin: 0.8 g/dL (ref 0.5–0.9)
Beta 2: 0.3 g/dL (ref 0.2–0.5)
Beta Globulin: 0.5 g/dL (ref 0.4–0.6)
GAMMA GLOBULIN: 1.5 g/dL (ref 0.8–1.7)
IGG (IMMUNOGLOBIN G), SERUM: 1990 mg/dL — AB (ref 650–1600)
IGM, SERUM: 19 mg/dL — AB (ref 41–251)
IgA: 58 mg/dL — ABNORMAL LOW (ref 68–379)
Total Protein, Serum Electrophoresis: 7.2 g/dL (ref 6.1–8.1)

## 2014-07-18 LAB — KAPPA/LAMBDA LIGHT CHAINS
KAPPA FREE LGHT CHN: 15.2 mg/dL — AB (ref 0.33–1.94)
Kappa:Lambda Ratio: 9.38 — ABNORMAL HIGH (ref 0.26–1.65)
Lambda Free Lght Chn: 1.62 mg/dL (ref 0.57–2.63)

## 2014-07-18 LAB — BETA 2 MICROGLOBULIN, SERUM: Beta-2 Microglobulin: 4.85 mg/L — ABNORMAL HIGH (ref <=2.51)

## 2014-07-25 ENCOUNTER — Encounter (HOSPITAL_COMMUNITY): Payer: Self-pay | Admitting: Dentistry

## 2014-07-28 ENCOUNTER — Ambulatory Visit (HOSPITAL_BASED_OUTPATIENT_CLINIC_OR_DEPARTMENT_OTHER): Payer: Medicare PPO | Admitting: Hematology and Oncology

## 2014-07-28 ENCOUNTER — Encounter: Payer: Self-pay | Admitting: Hematology and Oncology

## 2014-07-28 ENCOUNTER — Ambulatory Visit (HOSPITAL_BASED_OUTPATIENT_CLINIC_OR_DEPARTMENT_OTHER): Payer: Medicare PPO

## 2014-07-28 ENCOUNTER — Telehealth: Payer: Self-pay | Admitting: Hematology and Oncology

## 2014-07-28 ENCOUNTER — Telehealth: Payer: Self-pay | Admitting: *Deleted

## 2014-07-28 VITALS — BP 137/45 | HR 79 | Temp 98.0°F | Resp 18 | Ht 71.0 in | Wt 173.3 lb

## 2014-07-28 DIAGNOSIS — C9000 Multiple myeloma not having achieved remission: Secondary | ICD-10-CM

## 2014-07-28 DIAGNOSIS — N189 Chronic kidney disease, unspecified: Secondary | ICD-10-CM

## 2014-07-28 DIAGNOSIS — D631 Anemia in chronic kidney disease: Secondary | ICD-10-CM | POA: Diagnosis not present

## 2014-07-28 DIAGNOSIS — N183 Chronic kidney disease, stage 3 unspecified: Secondary | ICD-10-CM

## 2014-07-28 MED ORDER — SODIUM CHLORIDE 0.9 % IJ SOLN
10.0000 mL | INTRAMUSCULAR | Status: DC | PRN
  Administered 2014-07-28: 10 mL via INTRAVENOUS
  Filled 2014-07-28: qty 10

## 2014-07-28 MED ORDER — ZOLEDRONIC ACID 4 MG/5ML IV CONC
3.3000 mg | Freq: Once | INTRAVENOUS | Status: AC
  Administered 2014-07-28: 3.3 mg via INTRAVENOUS
  Filled 2014-07-28: qty 4.13

## 2014-07-28 MED ORDER — HEPARIN SOD (PORK) LOCK FLUSH 100 UNIT/ML IV SOLN
500.0000 [IU] | Freq: Once | INTRAVENOUS | Status: AC
  Administered 2014-07-28: 500 [IU] via INTRAVENOUS
  Filled 2014-07-28: qty 5

## 2014-07-28 MED ORDER — SODIUM CHLORIDE 0.9 % IV SOLN
Freq: Once | INTRAVENOUS | Status: AC
  Administered 2014-07-28: 12:00:00 via INTRAVENOUS

## 2014-07-28 NOTE — Assessment & Plan Note (Signed)
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 The patient denies recent history of bleeding such as epistaxis, hematuria or hematochezia. He is asymptomatic from the anemia. We will observe for now.  He does not require transfusion now.

## 2014-07-28 NOTE — Telephone Encounter (Signed)
10 mg 21 days, then off 7 days Tell him to hold on to the 5 mg in case we need to reduce it in the future

## 2014-07-28 NOTE — Telephone Encounter (Signed)
Pt states he has two full bottles of Revlimid at home.  One is 10 mg and one is 5 mg.  He wants to know which one he is supposed to take?

## 2014-07-28 NOTE — Telephone Encounter (Signed)
Instructed pt to take 10 mg Revlimid, 21 days on and 7 days off.  Hold on to the 5 mg in case he needs in future.  He verbalized understanding.

## 2014-07-28 NOTE — Progress Notes (Signed)
[place] OFFICE PROGRESS NOTE  Patient Care Team: Lucianne Lei, MD as PCP - General (Family Medicine)  SUMMARY OF ONCOLOGIC HISTORY:   Multiple myeloma   12/12/2010 Initial Diagnosis Multiple myeloma   02/16/2014 Imaging Skeletal survey showed diffuse osteopenia   03/02/2014 Bone Marrow Biopsy Accession: [id number] BM biopsy showed only 5 % plasma cells. However, the biopsy was difficult and the bone was fragmented during the procedure. Cytogenetics and FISH study is normal   03/03/2014 Procedure he has port placement   03/10/2014 - 05/19/2014 Chemotherapy he received elotuzumab and revlimid   05/20/2014 -  Chemotherapy Treatment is switched to maintenance Revlimid only    INTERVAL HISTORY: Please see below for problem oriented charting. He feels well. He has completed all dental extraction. Denies recent gum bleeding up or when healing. Denies recent infection. He denies bone pain. He complained of weakness and requested a cane with a seat so that he could rest if he feels tired with walking and also some shower chair seat  REVIEW OF SYSTEMS:   Constitutional: Denies fevers, chills or abnormal weight loss Eyes: Denies blurriness of vision Ears, nose, mouth, throat, and face: Denies mucositis or sore throat Respiratory: Denies cough, dyspnea or wheezes Cardiovascular: Denies palpitation, chest discomfort or lower extremity swelling Gastrointestinal:  Denies nausea, heartburn or change in bowel habits Skin: Denies abnormal skin rashes Lymphatics: Denies new lymphadenopathy or easy bruising Neurological:Denies numbness, tingling or new weaknesses Behavioral/Psych: Mood is stable, no new changes  All other systems were reviewed with the patient and are negative.  I have reviewed the past medical history, past surgical history, social history and family history with the patient and they are unchanged from previous note.  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  MEDICATIONS:  Current Outpatient Prescriptions  Medication Sig Dispense Refill  . amLODipine (NORVASC) 10 MG tablet TAKE 1 TABLET BY MOUTH EVERY DAY 30 tablet 1  . aspirin EC 81 MG tablet Take 81 mg by mouth daily.    Marland Kitchen atorvastatin (LIPITOR) 20 MG tablet Take 20 mg by mouth at bedtime.     . brimonidine (ALPHAGAN) 0.15 % ophthalmic solution Place 1 drop into the right eye 3 (three) times daily.    . cholecalciferol (VITAMIN D) 1000 UNITS tablet Take 1,000 Units by mouth daily.    . dorzolamide-timolol (COSOPT) 22.3-6.8 MG/ML ophthalmic solution Place 1 drop into both eyes 2 (two) times daily.     . fluorometholone (FML) 0.1 % ophthalmic suspension Place 1 drop into both eyes daily.    . hydrochlorothiazide (HYDRODIURIL) 25 MG tablet TAKE 1 TABLET BY MOUTH EVERY DAY 90 tablet 4  . KLOR-CON M20 20 MEQ tablet TAKE 1 TABLET BY MOUTH EVERY DAY 90 tablet 1  . latanoprost (XALATAN) 0.005 % ophthalmic solution Place 1 drop into the right eye at bedtime.    Marland Kitchen lenalidomide (REVLIMID) 10 MG capsule Take 1 capsule (10 mg total) by mouth daily. Take 1 capsule daily for 21 days and then rest 7 days 21 capsule 0  . lidocaine-prilocaine (EMLA) cream Apply 1 application topically as needed. 30 g 1  . loperamide (IMODIUM A-D) 2 MG tablet 1 po after each watery BM.  Maximum 6 per 24hrs. 30 tablet PRN  . ondansetron (ZOFRAN) 8 MG tablet Take 1 tablet (8 mg total) by mouth 3 (three) times daily as needed for nausea or vomiting. 30 tablet 1  . oxyCODONE-acetaminophen (PERCOCET) 5-325 MG per tablet Take one or two tablets  by mouth every 4-6 hours as needed for pain. 40 tablet 0  . PRESCRIPTION MEDICATION [facility] Antibody Plan    . allopurinol (ZYLOPRIM) 100 MG tablet Take 100 mg by mouth 2 (two) times daily.      No current facility-administered medications for this visit.   Facility-Administered Medications Ordered in Other Visits  Medication Dose Route Frequency Provider Last Rate Last Dose  . sodium  chloride 0.9 % injection 10 mL  10 mL Intravenous PRN Heath Lark, MD   10 mL at 07/28/14 1255    PHYSICAL EXAMINATION: ECOG PERFORMANCE STATUS: 1 - Symptomatic but completely ambulatory  Filed Vitals:   07/28/14 1057  BP: 137/45  Pulse: 79  Temp: 98 F (36.7 C)  Resp: 18   Filed Weights   07/28/14 1057  Weight: 173 lb 4.8 oz (78.608 kg)    GENERAL:alert, no distress and comfortable SKIN: skin color, texture, turgor are normal, no rashes or significant lesions EYES: normal, Conjunctiva are pink and non-injected, sclera clear OROPHARYNX:no exudate, no erythema and lips, buccal mucosa, and tongue normal  Musculoskeletal:no cyanosis of digits and no clubbing  NEURO: alert & oriented x 3 with fluent speech, no focal motor/sensory deficits  LABORATORY DATA:  I have reviewed the data as listed    Component Value Date/Time   NA 138 07/14/2014 1053   NA 137 05/26/2014 1150   K 4.4 07/14/2014 1053   K 4.4 05/26/2014 1150   CL 103 05/26/2014 1150   CL 107 06/29/2012 1131   CO2 23 07/14/2014 1053   CO2 24 05/26/2014 1150   GLUCOSE 79 07/14/2014 1053   GLUCOSE 120* 05/26/2014 1150   GLUCOSE 143* 06/29/2012 1131   BUN 17.9 07/14/2014 1053   BUN 24* 05/26/2014 1150   CREATININE 1.7* 07/14/2014 1053   CREATININE 1.73* 05/26/2014 1150   CREATININE 2.03* 12/14/2010 1114   CALCIUM 9.7 07/14/2014 1053   CALCIUM 9.3 05/26/2014 1150   PROT 7.4 07/14/2014 1053   PROT 7.1 04/21/2014 1030   ALBUMIN 3.7 07/14/2014 1053   ALBUMIN 3.4* 04/21/2014 1030   AST 20 07/14/2014 1053   AST 19 04/21/2014 1030   ALT 14 07/14/2014 1053   ALT 22 04/21/2014 1030   ALKPHOS 104 07/14/2014 1053   ALKPHOS 84 04/21/2014 1030   BILITOT 0.51 07/14/2014 1053   BILITOT 0.4 04/21/2014 1030   GFRNONAA 38* 05/26/2014 1150   GFRAA 44* 05/26/2014 1150    No results found for: SPEP, UPEP  Lab Results  Component Value Date   WBC 8.3 07/14/2014   NEUTROABS 4.4 07/14/2014   HGB 10.0* 07/14/2014   HCT  30.7* 07/14/2014   MCV 88.5 07/14/2014   PLT 366 07/14/2014      Chemistry      Component Value Date/Time   NA 138 07/14/2014 1053   NA 137 05/26/2014 1150   K 4.4 07/14/2014 1053   K 4.4 05/26/2014 1150   CL 103 05/26/2014 1150   CL 107 06/29/2012 1131   CO2 23 07/14/2014 1053   CO2 24 05/26/2014 1150   BUN 17.9 07/14/2014 1053   BUN 24* 05/26/2014 1150   CREATININE 1.7* 07/14/2014 1053   CREATININE 1.73* 05/26/2014 1150   CREATININE 2.03* 12/14/2010 1114      Component Value Date/Time   CALCIUM 9.7 07/14/2014 1053   CALCIUM 9.3 05/26/2014 1150   ALKPHOS 104 07/14/2014 1053   ALKPHOS 84 04/21/2014 1030   AST 20 07/14/2014 1053   AST 19 04/21/2014 1030  ALT 14 07/14/2014 1053   ALT 22 04/21/2014 1030   BILITOT 0.51 07/14/2014 1053   BILITOT 0.4 04/21/2014 1030      ASSESSMENT & PLAN:  Multiple myeloma His serum protein electrophoresis and free light chains were a little higher. I suspect this could be related to recent interruption of Revlimid in anticipation for dental extraction. I would like to resume Revlimid at 10 mg, daily for 21 days, rest 7 days. He has achieved dental clearance to proceed with Zometa today. I will reduce the dose to 3 mg every 3 months. I reinforced the importance of calcium and vitamin D supplement We also discussed the possibility of resumption of Elotuzumab. However, I think the simplest and would be to try higher dose Revlimid for 2 months before we decide to switch his treatment  Anemia in chronic renal disease This is likely anemia of chronic disease from renal failure and relapsed myeloma. The patient denies recent history of bleeding such as epistaxis, hematuria or hematochezia. He is asymptomatic from the anemia. We will observe for now.  He does not require transfusion now.      Chronic renal insufficiency, stage III (moderate) This is chronic in nature. It is stable. Recommend observation only. I plan to give him Zometa with  reduced dose due to chronic renal failure     Orders Placed This Encounter  Procedures  . CBC with Differential/Platelet    Standing Status: Future     Number of Occurrences:      Standing Expiration Date: 09/01/2015  . Comprehensive metabolic panel    Standing Status: Future     Number of Occurrences:      Standing Expiration Date: 09/01/2015  . Lactate dehydrogenase    Standing Status: Future     Number of Occurrences:      Standing Expiration Date: 09/01/2015  . SPEP & IFE with QIG    Standing Status: Future     Number of Occurrences:      Standing Expiration Date: 09/01/2015  . Kappa/lambda light chains    Standing Status: Future     Number of Occurrences:      Standing Expiration Date: 09/01/2015   All questions were answered. The patient knows to call the clinic with any problems, questions or concerns. No barriers to learning was detected. I spent 25 minutes counseling the patient face to face. The total time spent in the appointment was 30 minutes and more than 50% was on counseling and review of test results     [facility], [place], MD 07/28/2014 1:01 PM

## 2014-07-28 NOTE — Telephone Encounter (Signed)
Pt confirmed labs/ov per 07/21 POF, gave pt AVS and Calendar.... KJ, sent msg to MD to see if pt is supposed to be scheduled for Zometa for next visit per pt's request..Marland Kitchen

## 2014-07-28 NOTE — Assessment & Plan Note (Addendum)
His serum protein electrophoresis and free light chains were a little higher. I suspect this could be related to recent interruption of Revlimid in anticipation for dental extraction. I would like to resume Revlimid at 10 mg, daily for 21 days, rest 7 days. He has achieved dental clearance to proceed with Zometa today. I will reduce the dose to 3 mg every 3 months. I reinforced the importance of calcium and vitamin D supplement We also discussed the possibility of resumption of Elotuzumab. However, I think the simplest and would be to try higher dose Revlimid for 2 months before we decide to switch his treatment

## 2014-07-28 NOTE — Assessment & Plan Note (Signed)
This is chronic in nature. It is stable. Recommend observation only. I plan to give him Zometa with reduced dose due to chronic renal failure

## 2014-08-01 ENCOUNTER — Other Ambulatory Visit: Payer: Self-pay | Admitting: *Deleted

## 2014-08-01 ENCOUNTER — Encounter (HOSPITAL_COMMUNITY): Payer: Self-pay | Admitting: Dentistry

## 2014-08-01 ENCOUNTER — Ambulatory Visit (HOSPITAL_COMMUNITY): Payer: Self-pay | Admitting: Dentistry

## 2014-08-01 VITALS — BP 127/61 | HR 58 | Temp 98.2°F

## 2014-08-01 DIAGNOSIS — K0820 Unspecified atrophy of edentulous alveolar ridge: Secondary | ICD-10-CM

## 2014-08-01 DIAGNOSIS — K08109 Complete loss of teeth, unspecified cause, unspecified class: Secondary | ICD-10-CM

## 2014-08-01 DIAGNOSIS — C9000 Multiple myeloma not having achieved remission: Secondary | ICD-10-CM

## 2014-08-01 DIAGNOSIS — Z463 Encounter for fitting and adjustment of dental prosthetic device: Secondary | ICD-10-CM

## 2014-08-01 MED ORDER — LENALIDOMIDE 10 MG PO CAPS: 10.0000 mg | ORAL_CAPSULE | Freq: Every day | ORAL | Status: DC

## 2014-08-01 NOTE — Progress Notes (Signed)
08/01/2014  Patient Name:   Calvin Mccormick Date of Birth:   1945/06/16 Medical Record Number: [id number]  BP 127/61 mmHg  Pulse 58  Temp(Src) 98.2 F (36.8 C) (Oral)  Calvin Mccormick presents for continued denture fabrication.  Procedure:  Upper and lower denture Jaw relations with aluwax bite registration. 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 Patient agrees to tooth selection of 22 E, H, and  10 degree posteriors to match with Portrait A2 shade. Patient tolerated procedure well. RTC for denture wax try in.   Lenn Cal, DDS

## 2014-08-01 NOTE — Patient Instructions (Signed)
Return to clinic as scheduled for continued upper lower complete denture fabrication. Dr. Kulinski 

## 2014-08-09 ENCOUNTER — Ambulatory Visit (HOSPITAL_COMMUNITY): Payer: Self-pay | Admitting: Dentistry

## 2014-08-09 ENCOUNTER — Encounter (HOSPITAL_COMMUNITY): Payer: Self-pay | Admitting: Dentistry

## 2014-08-09 VITALS — BP 117/56 | HR 52 | Temp 98.5°F

## 2014-08-09 DIAGNOSIS — K08109 Complete loss of teeth, unspecified cause, unspecified class: Secondary | ICD-10-CM

## 2014-08-09 DIAGNOSIS — Z463 Encounter for fitting and adjustment of dental prosthetic device: Secondary | ICD-10-CM

## 2014-08-09 DIAGNOSIS — C9000 Multiple myeloma not having achieved remission: Secondary | ICD-10-CM

## 2014-08-09 DIAGNOSIS — K0820 Unspecified atrophy of edentulous alveolar ridge: Secondary | ICD-10-CM

## 2014-08-09 NOTE — Progress Notes (Signed)
08/09/2014  Patient Name:   Calvin Mccormick Date of Birth:   1945-12-16 Medical Record Number: [id number]   BP 117/56 mmHg  Pulse 52  Temp(Src) 98.5 F (36.9 C) (Oral)  Jayin J Krinsky presents for continued upper and lower denture fabrication.  Procedure:  Upper and lower denture wax tryin. Patient very difficult to assess for centric relation secondary to persistent protrusion of lower jaw into non-centric position. After evaluation of centric position, it was determined that patient had an open bite on the posterior aspect of the left side of the occlusion. Mandibular posterior teeth were removed. A new bite registration was obtained with Aluwax. Patient is to return to clinic for second wax try next week. I contacted [facility] and discussed new bite registration and reset of teeth as needed.  Patient to RTC for  upper and lower denture wax tryin-2.  Lenn Cal, DDS

## 2014-08-09 NOTE — Patient Instructions (Signed)
Return to clinic as scheduled for continued upper and lower complete denture fabrication. Dr. Kulinski 

## 2014-08-17 ENCOUNTER — Encounter (HOSPITAL_COMMUNITY): Payer: Self-pay | Admitting: Dentistry

## 2014-08-17 ENCOUNTER — Ambulatory Visit (HOSPITAL_COMMUNITY): Payer: Self-pay | Admitting: Dentistry

## 2014-08-17 VITALS — BP 125/62 | HR 60 | Temp 98.4°F

## 2014-08-17 DIAGNOSIS — K08109 Complete loss of teeth, unspecified cause, unspecified class: Secondary | ICD-10-CM

## 2014-08-17 DIAGNOSIS — C9000 Multiple myeloma not having achieved remission: Secondary | ICD-10-CM

## 2014-08-17 DIAGNOSIS — Z463 Encounter for fitting and adjustment of dental prosthetic device: Secondary | ICD-10-CM

## 2014-08-17 DIAGNOSIS — K0820 Unspecified atrophy of edentulous alveolar ridge: Secondary | ICD-10-CM

## 2014-08-17 NOTE — Progress Notes (Signed)
08/17/2014  Patient Name:   Calvin Mccormick Date of Birth:   07-24-1945 Medical Record Number: [id number]   BP 125/62 mmHg  Pulse 60  Temp(Src) 98.4 F (36.9 C)  [name] presents for continued upper and lower denture fabrication.  Procedure:  Upper and lower denture wax tryin. Patient accepts esthetics, phonetics, fit and function. Patient agrees to process "as is" in 50:50 [place] 199. Patient to RTC for  upper and lower denture insertion.  Lenn Cal, DDS

## 2014-08-17 NOTE — Patient Instructions (Signed)
Return to clinic as scheduled for continued upper and lower complete denture fabrication. Dr. Adriene Knipfer 

## 2014-08-29 ENCOUNTER — Encounter (HOSPITAL_COMMUNITY): Payer: Self-pay | Admitting: Dentistry

## 2014-09-01 ENCOUNTER — Encounter (INDEPENDENT_AMBULATORY_CARE_PROVIDER_SITE_OTHER): Payer: Self-pay

## 2014-09-01 ENCOUNTER — Encounter (HOSPITAL_COMMUNITY): Payer: Self-pay | Admitting: Dentistry

## 2014-09-01 ENCOUNTER — Ambulatory Visit (HOSPITAL_COMMUNITY): Payer: Self-pay | Admitting: Dentistry

## 2014-09-01 VITALS — BP 137/58 | HR 55 | Temp 98.3°F

## 2014-09-01 DIAGNOSIS — C9000 Multiple myeloma not having achieved remission: Secondary | ICD-10-CM

## 2014-09-01 DIAGNOSIS — K08109 Complete loss of teeth, unspecified cause, unspecified class: Secondary | ICD-10-CM

## 2014-09-01 DIAGNOSIS — K0820 Unspecified atrophy of edentulous alveolar ridge: Secondary | ICD-10-CM

## 2014-09-01 DIAGNOSIS — Z463 Encounter for fitting and adjustment of dental prosthetic device: Secondary | ICD-10-CM

## 2014-09-01 NOTE — Progress Notes (Signed)
09/01/2014  Patient Name:   Calvin Mccormick Date of Birth:   05/05/1945 Medical Record Number: [id number]  BP 137/58 mmHg  Pulse 55  Temp(Src) 98.3 F (36.8 C) (Oral)  Maddax J Kervin presents for insertion of upper and lower complete dentures.  Procedure: Pressure indicating paste was applied to the dentures. Adjustments were made as needed. [place] Occlusion evaluated and adjustments made as needed for Centric Relation and protrusive strokes. Good esthetics, phonetics, fit, and function noted. Patient accepts results. Post op instructions provided in written and verbal formats on use and care of dentures. Gave patient denture brush and cup. Patient to keep dentures out if sore spots develop. Use salt water rinses as needed to aid healing. Return to clinic as scheduled for denture adjustment.   Call if problems arise before then.  Lenn Cal, DDS

## 2014-09-01 NOTE — Patient Instructions (Addendum)
Patient to keep dentures out if sore spots develop. Use salt water rinses as needed to aid healing. Return to clinic as scheduled for denture adjustment.   Call if problems arise before then.  Loghan Subia F. Florentina Marquart, DDS    Instructions for Denture Use and Care  Congratulations, you are on the way to oral rehabilitation!  You have just received a new set of complete or partial dentures.  These prostheses will help to improve both your appearance and chewing ability.  These instructions will help you get adjusted to your dentures as well as care for them properly.  Please read these instructions carefully and completely as soon as you get home.  If you or your caregiver have any questions please notify the [facility] at [phone].  HOW YOUR DENTURES LOOK AND FEEL Soon after you begin wearing your dentures, you may feel that your dentures are too large or even loose.  As our mouth and facial muscles become accustomed to the dentures, these feelings will go away.  You also may feel that you are salivating more than you normally do.  This feeling should go away as you get used to having the dentures in your mouth.  You may bite your cheek or your tongue; this will eventually resolve itself as you wear your dentures.  Some soreness is to be expected, but you should not hurt.  If your mouth hurts, call your dentist.  A denture adhesive may occasionally be necessary to hold your dentures in place more securely.  The dentist will let you know when one is recommended for you.  SPEAKING Wearing dentures will change the sound of your voice initially.  This will be noticed by you more than anyone else.  Bite and swallow before you speak, in order to place your dentures in position so that you may speak more clearly.  Practice speaking by reading aloud or counting from 1 to 100 very slowly and distinctly.  After some practice your mouth will become accustomed to your dentures and you will speak  more clearly.  EATING Chewing will definitely be different after you receive your dentures.  With a little practice and patience you should be able to eat just about any kind of food.  Begin by eating small quantities of food that are cut into small pieces.  Star with soft foods such as eggs, cooked vegetables, or puddings.  As you gain confidence advance  Your diet to whatever texture foods you can tolerate.  DENTURE CARE Dentures can collect plaque and calculus much the same as natural teeth can.  If not removed on a regular basis, your dentures will not look or feel clean, and you will experience denture odor.  It is very important that you remove your dentures at bedtime and clean them thoroughly.  You should: 1. Clean your dentures over a sink full of water so if dropped, breakage will be prevented. 2. Rinse your dentures with cool water to remove any large food particles. 3. Use soap and water or a denture cleanser or paste to clean the dentures.  Do not use regular toothpaste as it may abrade the denture base or teeth. 4. Use a moistened denture brush to clean all surfaces (inside and outside). 5. Rinse thoroughly to remove any remaining soap or denture cleanser. 6. Use a soft bristle toothbrush to gently brush any natural teeth, gums, tongue, and palate at bedtime and before reinserting your dentures. 7. Do not sleep with your dentures   in your mouth at night.  Remove your dentures and soak them overnight in a denture cup filled with water or denture solution as recommended by your dentist.  This routine will become second nature and will increase the life and comfort of your dentures.  Please do not try to adjust these dentures yourself; you could damage them.  FOLLOW-UP You should call or make an appointment with your dentist.  Your dentist would like to see you at least once a year for a check-up and examination. 

## 2014-09-05 ENCOUNTER — Ambulatory Visit (HOSPITAL_COMMUNITY): Payer: Self-pay | Admitting: Dentistry

## 2014-09-05 ENCOUNTER — Encounter (HOSPITAL_COMMUNITY): Payer: Self-pay | Admitting: Dentistry

## 2014-09-05 ENCOUNTER — Encounter (INDEPENDENT_AMBULATORY_CARE_PROVIDER_SITE_OTHER): Payer: Self-pay

## 2014-09-05 VITALS — BP 132/57 | HR 51 | Temp 97.8°F

## 2014-09-05 DIAGNOSIS — Z972 Presence of dental prosthetic device (complete) (partial): Secondary | ICD-10-CM

## 2014-09-05 DIAGNOSIS — C9000 Multiple myeloma not having achieved remission: Secondary | ICD-10-CM

## 2014-09-05 DIAGNOSIS — K0820 Unspecified atrophy of edentulous alveolar ridge: Secondary | ICD-10-CM

## 2014-09-05 DIAGNOSIS — K08109 Complete loss of teeth, unspecified cause, unspecified class: Secondary | ICD-10-CM

## 2014-09-05 DIAGNOSIS — Z463 Encounter for fitting and adjustment of dental prosthetic device: Secondary | ICD-10-CM

## 2014-09-05 NOTE — Progress Notes (Signed)
09/05/2014  Patient Name:   Calvin Mccormick Date of Birth:   [birth year] Medical Record Number: [id number]  BP 132/57 mmHg  Pulse 51  Temp(Src) 97.8 F (36.6 C) (Oral)  Prateek J Sabet presents for evaluation of recently inserted upper and lower complete dentures. SUBJECTIVE: Patient is complaining of some minor mandibular anterior denture irritation. OBJECTIVE: There is no sign of denture irritation or erythema. Procedure: Pressure indicating paste was applied to the dentures. Adjustments were made as needed. [place] Occlusion evaluated and adjustments made as needed for Centric Relation and protrusive strokes. Patient has a tendency to protrude to End to End position.  Patient was instructed on finding maximum intercuspation position.  Patient accepts results. Patient to keep dentures out if sore spots develop. Use salt water rinses as needed to aid healing. Return to clinic as scheduled for denture adjustment.   Call if problems arise before then.  Lenn Cal, DDS

## 2014-09-05 NOTE — Patient Instructions (Signed)
Patient to keep dentures out if sore spots develop. Use salt water rinses as needed to aid healing. Return to clinic as scheduled for denture adjustment.   Call if problems arise before then.  Ronald F. Kulinski, DDS  

## 2014-09-19 ENCOUNTER — Encounter (HOSPITAL_COMMUNITY): Payer: Self-pay | Admitting: Dentistry

## 2014-09-19 ENCOUNTER — Ambulatory Visit (HOSPITAL_COMMUNITY): Payer: Self-pay | Admitting: Dentistry

## 2014-09-19 VITALS — BP 135/60 | HR 53 | Temp 98.2°F

## 2014-09-19 DIAGNOSIS — K08109 Complete loss of teeth, unspecified cause, unspecified class: Secondary | ICD-10-CM

## 2014-09-19 DIAGNOSIS — Z972 Presence of dental prosthetic device (complete) (partial): Secondary | ICD-10-CM

## 2014-09-19 DIAGNOSIS — Z463 Encounter for fitting and adjustment of dental prosthetic device: Secondary | ICD-10-CM

## 2014-09-19 DIAGNOSIS — C9000 Multiple myeloma not having achieved remission: Secondary | ICD-10-CM

## 2014-09-19 DIAGNOSIS — K0820 Unspecified atrophy of edentulous alveolar ridge: Secondary | ICD-10-CM

## 2014-09-19 NOTE — Progress Notes (Signed)
09/19/2014  Patient Name:   Calvin Mccormick Date of Birth:   1945-10-18 Medical Record Number: [id number]  BP 135/60 mmHg  Pulse 53  Temp(Src) 98.2 F (36.8 C) (Oral)  Isaia J Sheeley presents for evaluation of recently inserted upper and lower complete dentures.  SUBJECTIVE: Patient is not complaining of any denture irritation. Patient is still having some difficulty eating with his dentures in. OBJECTIVE: There is no sign of denture irritation or erythema. Procedure: Pressure indicating paste was applied to the dentures. Adjustments were made as needed. [place] Occlusion evaluated and adjustments made as needed for Centric Relation and protrusive strokes. Patient still has a tendency to protrude to End to End position.  Patient again was instructed on finding maximum intercuspation position.  Patient accepts results. Patient to keep dentures out if sore spots develop. Use salt water rinses as needed to aid healing. Return to clinic as scheduled for denture adjustment.   Call if problems arise before then.  Lenn Cal, DDS

## 2014-09-19 NOTE — Patient Instructions (Signed)
Patient to keep dentures out if sore spots develop. Use salt water rinses as needed to aid healing. Return to clinic as scheduled for denture adjustment.   Call if problems arise before then.  Luria Rosario F. Leanza Shepperson, DDS  

## 2014-09-20 ENCOUNTER — Telehealth: Payer: Self-pay | Admitting: *Deleted

## 2014-09-20 NOTE — Telephone Encounter (Signed)
Hassan Rowan with [facility] called requesting "Revlimid order ASAP to get order to patient to resume on Thursday 09-22-2014"  Order can be faxed or sent eRx.

## 2014-09-20 NOTE — Telephone Encounter (Addendum)
Voicemail.  "No one has called me about my medication."  This nurse located faxed Revlimid refill request.  Request to collaborative and patient notified this will be started tomorrow.  Advised he take the patient survey.  "They'll call me."

## 2014-09-21 ENCOUNTER — Other Ambulatory Visit: Payer: Self-pay | Admitting: *Deleted

## 2014-09-21 MED ORDER — LENALIDOMIDE 10 MG PO CAPS: 10.0000 mg | ORAL_CAPSULE | Freq: Every day | ORAL | Status: DC

## 2014-09-26 ENCOUNTER — Other Ambulatory Visit (HOSPITAL_BASED_OUTPATIENT_CLINIC_OR_DEPARTMENT_OTHER): Payer: Medicare PPO

## 2014-09-26 ENCOUNTER — Ambulatory Visit (HOSPITAL_BASED_OUTPATIENT_CLINIC_OR_DEPARTMENT_OTHER): Payer: Medicare PPO

## 2014-09-26 DIAGNOSIS — C9000 Multiple myeloma not having achieved remission: Secondary | ICD-10-CM

## 2014-09-26 DIAGNOSIS — N183 Chronic kidney disease, stage 3 unspecified: Secondary | ICD-10-CM

## 2014-09-26 DIAGNOSIS — Z95828 Presence of other vascular implants and grafts: Secondary | ICD-10-CM

## 2014-09-26 LAB — COMPREHENSIVE METABOLIC PANEL (CC13)
ALBUMIN: 3.1 g/dL — AB (ref 3.5–5.0)
ALT: 12 U/L (ref 0–55)
ANION GAP: 6 meq/L (ref 3–11)
AST: 14 U/L (ref 5–34)
Alkaline Phosphatase: 66 U/L (ref 40–150)
BILIRUBIN TOTAL: 0.4 mg/dL (ref 0.20–1.20)
BUN: 18.3 mg/dL (ref 7.0–26.0)
CALCIUM: 9 mg/dL (ref 8.4–10.4)
CHLORIDE: 108 meq/L (ref 98–109)
CO2: 25 mEq/L (ref 22–29)
CREATININE: 1.6 mg/dL — AB (ref 0.7–1.3)
EGFR: 50 mL/min/{1.73_m2} — ABNORMAL LOW (ref >90)
Glucose: 112 mg/dl (ref 70–140)
Potassium: 4.2 mEq/L (ref 3.5–5.1)
Sodium: 138 mEq/L (ref 136–145)
TOTAL PROTEIN: 6.8 g/dL (ref 6.4–8.3)

## 2014-09-26 LAB — CBC WITH DIFFERENTIAL/PLATELET
BASO%: 2.9 % — AB (ref 0.0–2.0)
Basophils Absolute: 0.2 10*3/uL — ABNORMAL HIGH (ref 0.0–0.1)
EOS%: 9.7 % — AB (ref 0.0–7.0)
Eosinophils Absolute: 0.5 10*3/uL (ref 0.0–0.5)
HEMATOCRIT: 26.3 % — AB (ref 38.4–49.9)
HEMOGLOBIN: 8.5 g/dL — AB (ref 13.0–17.1)
LYMPH#: 1.4 10*3/uL (ref 0.9–3.3)
LYMPH%: 27.6 % (ref 14.0–49.0)
MCH: 27.8 pg (ref 27.2–33.4)
MCHC: 32.3 g/dL (ref 32.0–36.0)
MCV: 85.9 fL (ref 79.3–98.0)
MONO#: 0.5 10*3/uL (ref 0.1–0.9)
MONO%: 8.9 % (ref 0.0–14.0)
NEUT%: 50.9 % (ref 39.0–75.0)
NEUTROS ABS: 2.6 10*3/uL (ref 1.5–6.5)
PLATELETS: 249 10*3/uL (ref 140–400)
RBC: 3.06 10*6/uL — ABNORMAL LOW (ref 4.20–5.82)
RDW: 15.6 % — AB (ref 11.0–14.6)
WBC: 5.2 10*3/uL (ref 4.0–10.3)

## 2014-09-26 LAB — LACTATE DEHYDROGENASE (CC13): LDH: 128 U/L (ref 125–245)

## 2014-09-26 MED ORDER — SODIUM CHLORIDE 0.9 % IJ SOLN
10.0000 mL | INTRAMUSCULAR | Status: DC | PRN
  Administered 2014-09-26: 10 mL via INTRAVENOUS
  Filled 2014-09-26: qty 10

## 2014-09-26 MED ORDER — HEPARIN SOD (PORK) LOCK FLUSH 100 UNIT/ML IV SOLN
500.0000 [IU] | Freq: Once | INTRAVENOUS | Status: AC
  Administered 2014-09-26: 500 [IU] via INTRAVENOUS
  Filled 2014-09-26: qty 5

## 2014-09-26 NOTE — Patient Instructions (Signed)

## 2014-09-28 LAB — KAPPA/LAMBDA LIGHT CHAINS
KAPPA LAMBDA RATIO: 8.6 — AB (ref 0.26–1.65)
Kappa free light chain: 19 mg/dL — ABNORMAL HIGH (ref 0.33–1.94)
LAMBDA FREE LGHT CHN: 2.21 mg/dL (ref 0.57–2.63)

## 2014-09-28 LAB — SPEP & IFE WITH QIG
ALBUMIN ELP: 3.1 g/dL — AB (ref 3.8–4.8)
Abnormal Protein Band1: 0.9 g/dL
Alpha-1-Globulin: 0.5 g/dL — ABNORMAL HIGH (ref 0.2–0.3)
Alpha-2-Globulin: 0.8 g/dL (ref 0.5–0.9)
BETA 2: 0.3 g/dL (ref 0.2–0.5)
Beta Globulin: 0.5 g/dL (ref 0.4–0.6)
Gamma Globulin: 1.3 g/dL (ref 0.8–1.7)
IGA: 93 mg/dL (ref 68–379)
IGG (IMMUNOGLOBIN G), SERUM: 1540 mg/dL (ref 650–1600)
IGM, SERUM: 10 mg/dL — AB (ref 41–251)
TOTAL PROTEIN, SERUM ELECTROPHOR: 6.4 g/dL (ref 6.1–8.1)

## 2014-10-06 ENCOUNTER — Ambulatory Visit (HOSPITAL_BASED_OUTPATIENT_CLINIC_OR_DEPARTMENT_OTHER): Payer: Medicare PPO | Admitting: Hematology and Oncology

## 2014-10-06 ENCOUNTER — Encounter: Payer: Self-pay | Admitting: Hematology and Oncology

## 2014-10-06 ENCOUNTER — Telehealth: Payer: Self-pay | Admitting: Hematology and Oncology

## 2014-10-06 ENCOUNTER — Encounter: Payer: Self-pay | Admitting: Nutrition

## 2014-10-06 VITALS — BP 126/50 | HR 70 | Temp 98.5°F | Resp 18 | Ht 71.0 in | Wt 171.1 lb

## 2014-10-06 DIAGNOSIS — N183 Chronic kidney disease, stage 3 unspecified: Secondary | ICD-10-CM

## 2014-10-06 DIAGNOSIS — C9000 Multiple myeloma not having achieved remission: Secondary | ICD-10-CM | POA: Diagnosis not present

## 2014-10-06 DIAGNOSIS — E46 Unspecified protein-calorie malnutrition: Secondary | ICD-10-CM

## 2014-10-06 DIAGNOSIS — N189 Chronic kidney disease, unspecified: Secondary | ICD-10-CM | POA: Diagnosis not present

## 2014-10-06 DIAGNOSIS — D631 Anemia in chronic kidney disease: Secondary | ICD-10-CM | POA: Diagnosis not present

## 2014-10-06 NOTE — Assessment & Plan Note (Signed)
She has lost some weight due to recent illness. I will consult dietitian to see if we can give him some assistant of paying for nutritional supplement

## 2014-10-06 NOTE — Assessment & Plan Note (Signed)
His anemia is a little worse. The patient have recent viral illness. He also has lost a little bit of weight. He does not need treatment right now and I plan to recheck in 6 weeks. If his blood count continues to decline, I might have to modify the dose of his treatment

## 2014-10-06 NOTE — Telephone Encounter (Signed)
Gave and printed appt sched and avs fo rpt; for NOV  °

## 2014-10-06 NOTE — Assessment & Plan Note (Signed)
Recent M Spike and IgG levels are stable. We will continue same treatment without dose adjustment. Due to chronic renal failure, I will switch Zometa to twice a year. The patient declines flu shot.

## 2014-10-06 NOTE — Progress Notes (Signed)
[place] OFFICE PROGRESS NOTE  Patient Care Team: Lucianne Lei, MD as PCP - General (Family Medicine)  SUMMARY OF ONCOLOGIC HISTORY:   Multiple myeloma   12/12/2010 Initial Diagnosis Multiple myeloma   02/16/2014 Imaging Skeletal survey showed diffuse osteopenia   03/02/2014 Bone Marrow Biopsy Accession: [id number] biopsy showed only 5 % plasma cells. However, the biopsy was difficult and the bone was fragmented during the procedure. Cytogenetics and FISH study is normal   03/03/2014 Procedure he has port placement   03/10/2014 - 05/19/2014 Chemotherapy he received elotuzumab and revlimid   05/20/2014 -  Chemotherapy Treatment is switched to maintenance Revlimid only    INTERVAL HISTORY: Please see below for problem oriented charting.  he feels well. Denies recent infection. No new dental issue. He had some recent viral illness and have lost some weight. He has fully recovered from it. No recent fever, chills or cough.  REVIEW OF SYSTEMS:   Eyes: Denies blurriness of vision Ears, nose, mouth, throat, and face: Denies mucositis or sore throat Respiratory: Denies cough, dyspnea or wheezes Cardiovascular: Denies palpitation, chest discomfort or lower extremity swelling Gastrointestinal:  Denies nausea, heartburn or change in bowel habits Skin: Denies abnormal skin rashes Lymphatics: Denies new lymphadenopathy or easy bruising Neurological:Denies numbness, tingling or new weaknesses Behavioral/Psych: Mood is stable, no new changes  All other systems were reviewed with the patient and are negative.  I have reviewed the past medical history, past surgical history, social history and family history with the patient and they are unchanged from previous note.  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  MEDICATIONS:  Current Outpatient Prescriptions  Medication Sig Dispense Refill  . allopurinol (ZYLOPRIM) 100 MG tablet Take 100 mg by mouth 2 (two) times daily.      Marland Kitchen amLODipine (NORVASC) 10 MG tablet TAKE 1 TABLET BY MOUTH EVERY DAY 30 tablet 1  . aspirin EC 81 MG tablet Take 81 mg by mouth daily.    Marland Kitchen atorvastatin (LIPITOR) 20 MG tablet Take 20 mg by mouth at bedtime.     . brimonidine (ALPHAGAN) 0.15 % ophthalmic solution Place 1 drop into the right eye 3 (three) times daily.    . cholecalciferol (VITAMIN D) 1000 UNITS tablet Take 1,000 Units by mouth daily.    . dorzolamide-timolol (COSOPT) 22.3-6.8 MG/ML ophthalmic solution Place 1 drop into both eyes 2 (two) times daily.     . fluorometholone (FML) 0.1 % ophthalmic suspension Place 1 drop into both eyes daily.    . hydrochlorothiazide (HYDRODIURIL) 25 MG tablet TAKE 1 TABLET BY MOUTH EVERY DAY 90 tablet 4  . KLOR-CON M20 20 MEQ tablet TAKE 1 TABLET BY MOUTH EVERY DAY 90 tablet 1  . latanoprost (XALATAN) 0.005 % ophthalmic solution Place 1 drop into the right eye at bedtime.    Marland Kitchen lenalidomide (REVLIMID) 10 MG capsule Take 1 capsule (10 mg total) by mouth daily. Take 1 capsule daily for 21 days and then rest 7 days 21 capsule 0  . lidocaine-prilocaine (EMLA) cream Apply 1 application topically as needed. 30 g 1  . loperamide (IMODIUM A-D) 2 MG tablet 1 po after each watery BM.  Maximum 6 per 24hrs. 30 tablet PRN  . ondansetron (ZOFRAN) 8 MG tablet Take 1 tablet (8 mg total) by mouth 3 (three) times daily as needed for nausea or vomiting. 30 tablet 1  . oxyCODONE-acetaminophen (PERCOCET) 5-325 MG per tablet Take one or two tablets by mouth every 4-6 hours as needed  for pain. 40 tablet 0  . PRESCRIPTION MEDICATION [facility] Antibody Plan     No current facility-administered medications for this visit.    PHYSICAL EXAMINATION: ECOG PERFORMANCE STATUS: 1 - Symptomatic but completely ambulatory  Filed Vitals:   10/06/14 1114  BP: 126/50  Pulse: 70  Temp: 98.5 F (36.9 C)  Resp: 18   Filed Weights   10/06/14 1114  Weight: 171 lb 1.6 oz (77.61 kg)    GENERAL:alert, no distress and  comfortable SKIN: skin color, texture, turgor are normal, no rashes or significant lesions EYES: normal, Conjunctiva are pink and non-injected, sclera clear OROPHARYNX:no exudate, no erythema and lips, buccal mucosa, and tongue normal  Musculoskeletal:no cyanosis of digits and no clubbing  NEURO: alert & oriented x 3 with fluent speech, no focal motor/sensory deficits  LABORATORY DATA:  I have reviewed the data as listed    Component Value Date/Time   NA 138 09/26/2014 0938   NA 137 05/26/2014 1150   K 4.2 09/26/2014 0938   K 4.4 05/26/2014 1150   CL 103 05/26/2014 1150   CL 107 06/29/2012 1131   CO2 25 09/26/2014 0938   CO2 24 05/26/2014 1150   GLUCOSE 112 09/26/2014 0938   GLUCOSE 120* 05/26/2014 1150   GLUCOSE 143* 06/29/2012 1131   BUN 18.3 09/26/2014 0938   BUN 24* 05/26/2014 1150   CREATININE 1.6* 09/26/2014 0938   CREATININE 1.73* 05/26/2014 1150   CREATININE 2.03* 12/14/2010 1114   CALCIUM 9.0 09/26/2014 0938   CALCIUM 9.3 05/26/2014 1150   PROT 6.8 09/26/2014 0938   PROT 7.1 04/21/2014 1030   ALBUMIN 3.1* 09/26/2014 0938   ALBUMIN 3.4* 04/21/2014 1030   AST 14 09/26/2014 0938   AST 19 04/21/2014 1030   ALT 12 09/26/2014 0938   ALT 22 04/21/2014 1030   ALKPHOS 66 09/26/2014 0938   ALKPHOS 84 04/21/2014 1030   BILITOT 0.40 09/26/2014 0938   BILITOT 0.4 04/21/2014 1030   GFRNONAA 38* 05/26/2014 1150   GFRAA 44* 05/26/2014 1150    No results found for: SPEP, UPEP  Lab Results  Component Value Date   WBC 5.2 09/26/2014   NEUTROABS 2.6 09/26/2014   HGB 8.5* 09/26/2014   HCT 26.3* 09/26/2014   MCV 85.9 09/26/2014   PLT 249 09/26/2014      Chemistry      Component Value Date/Time   NA 138 09/26/2014 0938   NA 137 05/26/2014 1150   K 4.2 09/26/2014 0938   K 4.4 05/26/2014 1150   CL 103 05/26/2014 1150   CL 107 06/29/2012 1131   CO2 25 09/26/2014 0938   CO2 24 05/26/2014 1150   BUN 18.3 09/26/2014 0938   BUN 24* 05/26/2014 1150   CREATININE 1.6*  09/26/2014 0938   CREATININE 1.73* 05/26/2014 1150   CREATININE 2.03* 12/14/2010 1114      Component Value Date/Time   CALCIUM 9.0 09/26/2014 0938   CALCIUM 9.3 05/26/2014 1150   ALKPHOS 66 09/26/2014 0938   ALKPHOS 84 04/21/2014 1030   AST 14 09/26/2014 0938   AST 19 04/21/2014 1030   ALT 12 09/26/2014 0938   ALT 22 04/21/2014 1030   BILITOT 0.40 09/26/2014 0938   BILITOT 0.4 04/21/2014 1030      ASSESSMENT & PLAN:  Multiple myeloma Recent M Spike and IgG levels are stable. We will continue same treatment without dose adjustment. Due to chronic renal failure, I will switch Zometa to twice a year. The patient declines flu shot.  Anemia in chronic renal disease  His anemia is a little worse. The patient have recent viral illness. He also has lost a little bit of weight. He does not need treatment right now and I plan to recheck in 6 weeks. If his blood count continues to decline, I might have to modify the dose of his treatment  Chronic renal insufficiency, stage III (moderate) This is chronic in nature. It is stable. Recommend observation only. We'll give him a reduced dose Zometa twice a year due to chronic renal failure     Protein calorie malnutrition  She has lost some weight due to recent illness. I will consult dietitian to see if we can give him some assistant of paying for nutritional supplement   Orders Placed This Encounter  Procedures  . CBC with Differential/Platelet    Standing Status: Future     Number of Occurrences:      Standing Expiration Date: 11/10/2015  . Comprehensive metabolic panel    Standing Status: Future     Number of Occurrences:      Standing Expiration Date: 11/10/2015  . SPEP & IFE with QIG    Standing Status: Future     Number of Occurrences:      Standing Expiration Date: 11/10/2015  . Kappa/lambda light chains    Standing Status: Future     Number of Occurrences:      Standing Expiration Date: 11/10/2015   All questions were  answered. The patient knows to call the clinic with any problems, questions or concerns. No barriers to learning was detected. I spent 15 minutes counseling the patient face to face. The total time spent in the appointment was 20 minutes and more than 50% was on counseling and review of test results     [facility], [place], MD 10/06/2014 4:16 PM

## 2014-10-06 NOTE — Assessment & Plan Note (Signed)
This is chronic in nature. It is stable. Recommend observation only. We'll give him a reduced dose Zometa twice a year due to chronic renal failure

## 2014-10-06 NOTE — Progress Notes (Signed)
Provided 3rd and final complimentary case of Ensure plus for patient.

## 2014-10-13 ENCOUNTER — Other Ambulatory Visit: Payer: Self-pay | Admitting: *Deleted

## 2014-10-13 MED ORDER — LENALIDOMIDE 10 MG PO CAPS: 10.0000 mg | ORAL_CAPSULE | Freq: Every day | ORAL | Status: DC

## 2014-11-01 ENCOUNTER — Ambulatory Visit (HOSPITAL_COMMUNITY): Payer: Self-pay | Admitting: Dentistry

## 2014-11-01 ENCOUNTER — Encounter (INDEPENDENT_AMBULATORY_CARE_PROVIDER_SITE_OTHER): Payer: Self-pay

## 2014-11-01 ENCOUNTER — Encounter (HOSPITAL_COMMUNITY): Payer: Self-pay | Admitting: Dentistry

## 2014-11-01 DIAGNOSIS — K08109 Complete loss of teeth, unspecified cause, unspecified class: Secondary | ICD-10-CM

## 2014-11-01 NOTE — Progress Notes (Signed)
11/01/2014  Patient Name:   ZOHAIR EPP Date of Birth:   1945/07/15 Medical Record Number: [id number]  BP 129/62 mmHg  Pulse 59  Temp(Src) 98.3 F (36.8 C) (Oral)  Hideo J Mera presents for evaluation of recently inserted upper and lower complete dentures.  SUBJECTIVE: Patient is not complaining of any denture irritation. Patient is still having some difficulty eating with his dentures in. OBJECTIVE: There is no sign of denture irritation or erythema. Procedure: Pressure indicating paste was applied to the dentures. Adjustments were made as needed. [place] Thick PIP applied to denture borders. Adjustments made as needed. [place] Occlusion evaluated and adjustments made as needed for Centric Relation and protrusive strokes. Patient still has a tendency to protrude to End to End position.  Patient again was instructed on finding maximum intercuspation position.  Patient accepts results. Patient to keep dentures out if sore spots develop. Use salt water rinses as needed to aid healing. Return to clinic as scheduled for denture adjustment.   Call if problems arise before then.  Lenn Cal, DDS

## 2014-11-01 NOTE — Patient Instructions (Signed)
Return to clinic as scheduled for evaluation of dentures. Call if problems arise before then.  Use salt water rinses as needed. Dr. Enrique Sack

## 2014-11-08 ENCOUNTER — Other Ambulatory Visit (HOSPITAL_BASED_OUTPATIENT_CLINIC_OR_DEPARTMENT_OTHER): Payer: Medicare PPO

## 2014-11-08 DIAGNOSIS — C9000 Multiple myeloma not having achieved remission: Secondary | ICD-10-CM | POA: Diagnosis not present

## 2014-11-08 LAB — CBC WITH DIFFERENTIAL/PLATELET
BASO%: 1.1 % (ref 0.0–2.0)
BASOS ABS: 0.1 10*3/uL (ref 0.0–0.1)
EOS%: 8 % — AB (ref 0.0–7.0)
Eosinophils Absolute: 0.4 10*3/uL (ref 0.0–0.5)
HCT: 27.3 % — ABNORMAL LOW (ref 38.4–49.9)
HGB: 8.8 g/dL — ABNORMAL LOW (ref 13.0–17.1)
LYMPH%: 34.5 % (ref 14.0–49.0)
MCH: 27.9 pg (ref 27.2–33.4)
MCHC: 32.2 g/dL (ref 32.0–36.0)
MCV: 86.7 fL (ref 79.3–98.0)
MONO#: 0.7 10*3/uL (ref 0.1–0.9)
MONO%: 13.2 % (ref 0.0–14.0)
NEUT%: 43.2 % (ref 39.0–75.0)
NEUTROS ABS: 2.3 10*3/uL (ref 1.5–6.5)
Platelets: 300 10*3/uL (ref 140–400)
RBC: 3.15 10*6/uL — AB (ref 4.20–5.82)
RDW: 16.5 % — AB (ref 11.0–14.6)
WBC: 5.4 10*3/uL (ref 4.0–10.3)
lymph#: 1.9 10*3/uL (ref 0.9–3.3)

## 2014-11-08 LAB — COMPREHENSIVE METABOLIC PANEL (CC13)
ALT: 14 U/L (ref 0–55)
ANION GAP: 7 meq/L (ref 3–11)
AST: 16 U/L (ref 5–34)
Albumin: 3.4 g/dL — ABNORMAL LOW (ref 3.5–5.0)
Alkaline Phosphatase: 68 U/L (ref 40–150)
BUN: 17.3 mg/dL (ref 7.0–26.0)
CHLORIDE: 106 meq/L (ref 98–109)
CO2: 24 meq/L (ref 22–29)
CREATININE: 1.9 mg/dL — AB (ref 0.7–1.3)
Calcium: 9.3 mg/dL (ref 8.4–10.4)
EGFR: 40 mL/min/{1.73_m2} — ABNORMAL LOW (ref >90)
Glucose: 90 mg/dl (ref 70–140)
POTASSIUM: 4 meq/L (ref 3.5–5.1)
Sodium: 137 mEq/L (ref 136–145)
Total Bilirubin: 0.57 mg/dL (ref 0.20–1.20)
Total Protein: 7.4 g/dL (ref 6.4–8.3)

## 2014-11-10 LAB — SPEP & IFE WITH QIG
ABNORMAL PROTEIN BAND1: 0.8 g/dL
ALPHA-1-GLOBULIN: 0.5 g/dL — AB (ref 0.2–0.3)
ALPHA-2-GLOBULIN: 0.9 g/dL (ref 0.5–0.9)
Albumin ELP: 3.4 g/dL — ABNORMAL LOW (ref 3.8–4.8)
BETA GLOBULIN: 0.5 g/dL (ref 0.4–0.6)
Beta 2: 0.4 g/dL (ref 0.2–0.5)
GAMMA GLOBULIN: 1.4 g/dL (ref 0.8–1.7)
IgA: 127 mg/dL (ref 68–379)
IgG (Immunoglobin G), Serum: 1710 mg/dL — ABNORMAL HIGH (ref 650–1600)
IgM, Serum: 13 mg/dL — ABNORMAL LOW (ref 41–251)
Total Protein, Serum Electrophoresis: 7.1 g/dL (ref 6.1–8.1)

## 2014-11-10 LAB — KAPPA/LAMBDA LIGHT CHAINS
Kappa free light chain: 16 mg/dL — ABNORMAL HIGH (ref 0.33–1.94)
Kappa:Lambda Ratio: 5.78 — ABNORMAL HIGH (ref 0.26–1.65)
Lambda Free Lght Chn: 2.77 mg/dL — ABNORMAL HIGH (ref 0.57–2.63)

## 2014-11-11 ENCOUNTER — Other Ambulatory Visit: Payer: Self-pay | Admitting: *Deleted

## 2014-11-11 MED ORDER — LENALIDOMIDE 10 MG PO CAPS: 10.0000 mg | ORAL_CAPSULE | Freq: Every day | ORAL | Status: DC

## 2014-11-14 ENCOUNTER — Telehealth: Payer: Self-pay | Admitting: Hematology and Oncology

## 2014-11-14 NOTE — Telephone Encounter (Signed)
returned call and lvm fo rpt regarding to pt confirming appt

## 2014-11-15 ENCOUNTER — Encounter: Payer: Self-pay | Admitting: Hematology and Oncology

## 2014-11-15 ENCOUNTER — Telehealth: Payer: Self-pay | Admitting: Hematology and Oncology

## 2014-11-15 ENCOUNTER — Other Ambulatory Visit: Payer: Self-pay | Admitting: *Deleted

## 2014-11-15 ENCOUNTER — Ambulatory Visit (HOSPITAL_BASED_OUTPATIENT_CLINIC_OR_DEPARTMENT_OTHER): Payer: Medicare PPO | Admitting: Hematology and Oncology

## 2014-11-15 VITALS — BP 119/54 | HR 65 | Temp 97.7°F | Resp 20 | Ht 71.0 in | Wt 165.1 lb

## 2014-11-15 DIAGNOSIS — N183 Chronic kidney disease, stage 3 unspecified: Secondary | ICD-10-CM

## 2014-11-15 DIAGNOSIS — D61810 Antineoplastic chemotherapy induced pancytopenia: Secondary | ICD-10-CM

## 2014-11-15 DIAGNOSIS — D631 Anemia in chronic kidney disease: Secondary | ICD-10-CM

## 2014-11-15 DIAGNOSIS — N189 Chronic kidney disease, unspecified: Secondary | ICD-10-CM

## 2014-11-15 DIAGNOSIS — C9000 Multiple myeloma not having achieved remission: Secondary | ICD-10-CM | POA: Diagnosis not present

## 2014-11-15 DIAGNOSIS — D539 Nutritional anemia, unspecified: Secondary | ICD-10-CM | POA: Insufficient documentation

## 2014-11-15 DIAGNOSIS — E46 Unspecified protein-calorie malnutrition: Secondary | ICD-10-CM

## 2014-11-15 DIAGNOSIS — T451X5A Adverse effect of antineoplastic and immunosuppressive drugs, initial encounter: Secondary | ICD-10-CM

## 2014-11-15 HISTORY — DX: Nutritional anemia, unspecified: D53.9

## 2014-11-15 MED ORDER — LENALIDOMIDE 5 MG PO CAPS: 5.0000 mg | ORAL_CAPSULE | Freq: Every day | ORAL | Status: DC

## 2014-11-15 NOTE — Assessment & Plan Note (Addendum)
This is likely due to recent treatment. The patient denies recent history of bleeding such as epistaxis, hematuria or hematochezia. He is asymptomatic from the anemia. I will reduce the dose of Revlimid as above. I will proceed to order serum iron studies and vitamin B-12 with his next blood draw to exclude nutritional deficiency as an additional cause of the pancytopenia

## 2014-11-15 NOTE — Assessment & Plan Note (Signed)
This is chronic in nature. It is stable. Recommend observation only. We'll give him a reduced dose Zometa twice a year due to chronic renal failure

## 2014-11-15 NOTE — Assessment & Plan Note (Signed)
His serum protein is a little low. He denies recent diarrhea or infection. I encouraged him to increase oral intake as tolerated

## 2014-11-15 NOTE — Assessment & Plan Note (Signed)
Recent M Spike and IgG levels are stable. We will continue same treatment without dose adjustment. Due to chronic renal failure, I will switch Zometa to twice a year. The patient declines flu shot. With worsening anemia, I plan to reduce the dose of Revlimid to 5 mg, 21 days on 7 days off. I will see him back in 3 months

## 2014-11-15 NOTE — Progress Notes (Signed)
[place] OFFICE PROGRESS NOTE  Patient Care Team: Lucianne Lei, MD as PCP - General (Family Medicine) Heath Lark, MD as Consulting Physician (Hematology and Oncology)  SUMMARY OF ONCOLOGIC HISTORY:   Multiple myeloma ([facility])   12/12/2010 Initial Diagnosis Multiple myeloma   02/16/2014 Imaging Skeletal survey showed diffuse osteopenia   03/02/2014 Bone Marrow Biopsy Accession: [id number] biopsy showed only 5 % plasma cells. However, the biopsy was difficult and the bone was fragmented during the procedure. Cytogenetics and FISH study is normal   03/03/2014 Procedure he has port placement   03/10/2014 - 05/19/2014 Chemotherapy he received elotuzumab and revlimid   05/20/2014 -  Chemotherapy Treatment is switched to maintenance Revlimid only   11/15/2014 Adverse Reaction He has mild worsening anemia. Does of Revlimid reduced to 5 mg 21 days on, 7 days off    INTERVAL HISTORY: Please see below for problem oriented charting. He feels well. He had recent dental appointment and denies jaw pain or anything to suspect osteonecrosis of the jaw. Denies recent infection. He complain of fatigue. The patient denies any recent signs or symptoms of bleeding such as spontaneous epistaxis, hematuria or hematochezia.   REVIEW OF SYSTEMS:   Constitutional: Denies fevers, chills or abnormal weight loss Eyes: Denies blurriness of vision Ears, nose, mouth, throat, and face: Denies mucositis or sore throat Respiratory: Denies cough, dyspnea or wheezes Cardiovascular: Denies palpitation, chest discomfort or lower extremity swelling Gastrointestinal:  Denies nausea, heartburn or change in bowel habits Skin: Denies abnormal skin rashes Lymphatics: Denies new lymphadenopathy or easy bruising Neurological:Denies numbness, tingling or new weaknesses Behavioral/Psych: Mood is stable, no new changes  All other systems were reviewed with the patient and are negative.  I have reviewed the past medical  history, past surgical history, social history and family history with the patient and they are unchanged from previous note.  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  MEDICATIONS:  Current Outpatient Prescriptions  Medication Sig Dispense Refill  . allopurinol (ZYLOPRIM) 100 MG tablet Take 100 mg by mouth 2 (two) times daily.     Marland Kitchen amLODipine (NORVASC) 10 MG tablet TAKE 1 TABLET BY MOUTH EVERY DAY 30 tablet 1  . aspirin EC 81 MG tablet Take 81 mg by mouth daily.    Marland Kitchen atorvastatin (LIPITOR) 20 MG tablet Take 20 mg by mouth at bedtime.     . brimonidine (ALPHAGAN) 0.15 % ophthalmic solution Place 1 drop into the right eye 3 (three) times daily.    . cholecalciferol (VITAMIN D) 1000 UNITS tablet Take 1,000 Units by mouth daily.    . dorzolamide-timolol (COSOPT) 22.3-6.8 MG/ML ophthalmic solution Place 1 drop into both eyes 2 (two) times daily.     . fluorometholone (FML) 0.1 % ophthalmic suspension Place 1 drop into both eyes daily.    . hydrochlorothiazide (HYDRODIURIL) 25 MG tablet TAKE 1 TABLET BY MOUTH EVERY DAY 90 tablet 4  . KLOR-CON M20 20 MEQ tablet TAKE 1 TABLET BY MOUTH EVERY DAY 90 tablet 1  . latanoprost (XALATAN) 0.005 % ophthalmic solution Place 1 drop into the right eye at bedtime.    Marland Kitchen lenalidomide (REVLIMID) 5 MG capsule Take 1 capsule (5 mg total) by mouth daily. Take 1 capsule daily for 21 days and then rest 7 days 21 capsule 9  . lidocaine-prilocaine (EMLA) cream Apply 1 application topically as needed. 30 g 1  . loperamide (IMODIUM A-D) 2 MG tablet 1 po after each watery BM.  Maximum 6 per  24hrs. 30 tablet PRN  . ondansetron (ZOFRAN) 8 MG tablet Take 1 tablet (8 mg total) by mouth 3 (three) times daily as needed for nausea or vomiting. 30 tablet 1  . oxyCODONE-acetaminophen (PERCOCET) 5-325 MG per tablet Take one or two tablets by mouth every 4-6 hours as needed for pain. 40 tablet 0  . PRESCRIPTION MEDICATION [facility] Antibody Plan     No current  facility-administered medications for this visit.    PHYSICAL EXAMINATION: ECOG PERFORMANCE STATUS: 1 - Symptomatic but completely ambulatory  Filed Vitals:   11/15/14 1043  BP: 119/54  Pulse: 65  Temp: 97.7 F (36.5 C)  Resp: 20   Filed Weights   11/15/14 1043  Weight: 165 lb 1.6 oz (74.889 kg)    GENERAL:alert, no distress and comfortable SKIN: skin color, texture, turgor are normal, no rashes or significant lesions EYES: normal, Conjunctiva are pink and non-injected, sclera clear OROPHARYNX:no exudate, no erythema and lips, buccal mucosa, and tongue normal . He has no teeth Musculoskeletal:no cyanosis of digits and no clubbing  NEURO: alert & oriented x 3 with fluent speech, no focal motor/sensory deficits  LABORATORY DATA:  I have reviewed the data as listed    Component Value Date/Time   NA 137 11/08/2014 1100   NA 137 05/26/2014 1150   K 4.0 11/08/2014 1100   K 4.4 05/26/2014 1150   CL 103 05/26/2014 1150   CL 107 06/29/2012 1131   CO2 24 11/08/2014 1100   CO2 24 05/26/2014 1150   GLUCOSE 90 11/08/2014 1100   GLUCOSE 120* 05/26/2014 1150   GLUCOSE 143* 06/29/2012 1131   BUN 17.3 11/08/2014 1100   BUN 24* 05/26/2014 1150   CREATININE 1.9* 11/08/2014 1100   CREATININE 1.73* 05/26/2014 1150   CREATININE 2.03* 12/14/2010 1114   CALCIUM 9.3 11/08/2014 1100   CALCIUM 9.3 05/26/2014 1150   PROT 7.4 11/08/2014 1100   PROT 7.1 04/21/2014 1030   ALBUMIN 3.4* 11/08/2014 1100   ALBUMIN 3.4* 04/21/2014 1030   AST 16 11/08/2014 1100   AST 19 04/21/2014 1030   ALT 14 11/08/2014 1100   ALT 22 04/21/2014 1030   ALKPHOS 68 11/08/2014 1100   ALKPHOS 84 04/21/2014 1030   BILITOT 0.57 11/08/2014 1100   BILITOT 0.4 04/21/2014 1030   GFRNONAA 38* 05/26/2014 1150   GFRAA 44* 05/26/2014 1150    No results found for: SPEP, UPEP  Lab Results  Component Value Date   WBC 5.4 11/08/2014   NEUTROABS 2.3 11/08/2014   HGB 8.8* 11/08/2014   HCT 27.3* 11/08/2014   MCV 86.7  11/08/2014   PLT 300 11/08/2014      Chemistry      Component Value Date/Time   NA 137 11/08/2014 1100   NA 137 05/26/2014 1150   K 4.0 11/08/2014 1100   K 4.4 05/26/2014 1150   CL 103 05/26/2014 1150   CL 107 06/29/2012 1131   CO2 24 11/08/2014 1100   CO2 24 05/26/2014 1150   BUN 17.3 11/08/2014 1100   BUN 24* 05/26/2014 1150   CREATININE 1.9* 11/08/2014 1100   CREATININE 1.73* 05/26/2014 1150   CREATININE 2.03* 12/14/2010 1114      Component Value Date/Time   CALCIUM 9.3 11/08/2014 1100   CALCIUM 9.3 05/26/2014 1150   ALKPHOS 68 11/08/2014 1100   ALKPHOS 84 04/21/2014 1030   AST 16 11/08/2014 1100   AST 19 04/21/2014 1030   ALT 14 11/08/2014 1100   ALT 22 04/21/2014 1030  BILITOT 0.57 11/08/2014 1100   BILITOT 0.4 04/21/2014 1030     ASSESSMENT & PLAN:  Multiple myeloma Recent M Spike and IgG levels are stable. We will continue same treatment without dose adjustment. Due to chronic renal failure, I will switch Zometa to twice a year. The patient declines flu shot. With worsening anemia, I plan to reduce the dose of Revlimid to 5 mg, 21 days on 7 days off. I will see him back in 3 months    CKD (chronic kidney disease), stage III This is chronic in nature. It is stable. Recommend observation only. We'll give him a reduced dose Zometa twice a year due to chronic renal failure     Pancytopenia due to antineoplastic chemotherapy [facility]) This is likely due to recent treatment. The patient denies recent history of bleeding such as epistaxis, hematuria or hematochezia. He is asymptomatic from the anemia. I will reduce the dose of Revlimid as above. I will proceed to order serum iron studies and vitamin B-12 with his next blood draw to exclude nutritional deficiency as an additional cause of the pancytopenia  Protein calorie malnutrition His serum protein is a little low. He denies recent diarrhea or infection. I encouraged him to increase oral intake as  tolerated   Orders Placed This Encounter  Procedures  . CBC with Differential/Platelet    Standing Status: Future     Number of Occurrences:      Standing Expiration Date: 12/20/2015  . Comprehensive metabolic panel    Standing Status: Future     Number of Occurrences:      Standing Expiration Date: 12/20/2015  . SPEP & IFE with QIG    Standing Status: Future     Number of Occurrences:      Standing Expiration Date: 12/20/2015  . Kappa/lambda light chains    Standing Status: Future     Number of Occurrences:      Standing Expiration Date: 12/20/2015  . Ferritin    Standing Status: Future     Number of Occurrences:      Standing Expiration Date: 12/20/2015  . Iron and TIBC    Standing Status: Future     Number of Occurrences:      Standing Expiration Date: 12/20/2015  . Vitamin B12    Standing Status: Future     Number of Occurrences:      Standing Expiration Date: 12/20/2015   All questions were answered. The patient knows to call the clinic with any problems, questions or concerns. No barriers to learning was detected. I spent 20 minutes counseling the patient face to face. The total time spent in the appointment was 25 minutes and more than 50% was on counseling and review of test results     [facility], [place], MD 11/15/2014 10:59 AM

## 2014-11-15 NOTE — Telephone Encounter (Signed)
Gave and printed appt sched and avs fo rpt for Feb 2017 °

## 2014-11-15 NOTE — Telephone Encounter (Signed)
[facility] notified of new dose. Rx for 5mg  Revlimid faxed to [facility]

## 2014-12-14 ENCOUNTER — Telehealth: Payer: Self-pay | Admitting: *Deleted

## 2014-12-14 ENCOUNTER — Other Ambulatory Visit: Payer: Self-pay | Admitting: *Deleted

## 2014-12-14 MED ORDER — LENALIDOMIDE 5 MG PO CAPS: 5.0000 mg | ORAL_CAPSULE | Freq: Every day | ORAL | Status: DC

## 2014-12-14 NOTE — Telephone Encounter (Signed)
TC from [facility] requesting refill on Revlimid. Pt due to resume on Thursday, 12/15/14. Needs refill script faxd to them [place] fax [phone]

## 2014-12-14 NOTE — Telephone Encounter (Signed)
TC to patient to make him aware that the revlimid prescription has been fax'd to [facility]. He voiced understanding

## 2015-01-05 ENCOUNTER — Other Ambulatory Visit: Payer: Self-pay | Admitting: Hematology and Oncology

## 2015-01-11 ENCOUNTER — Other Ambulatory Visit: Payer: Self-pay | Admitting: *Deleted

## 2015-01-11 MED ORDER — LENALIDOMIDE 5 MG PO CAPS: 5.0000 mg | ORAL_CAPSULE | Freq: Every day | ORAL | Status: DC

## 2015-01-17 ENCOUNTER — Telehealth: Payer: Self-pay | Admitting: *Deleted

## 2015-01-17 NOTE — Telephone Encounter (Signed)
Revlimid e-scribed to [facility] on 1/4.   Received refill request form from [facility] yesterday.  Called [facility] and they do not know why we got refill request.  They say Revlimid was shipped out to pt yesterday.

## 2015-01-31 ENCOUNTER — Ambulatory Visit (HOSPITAL_COMMUNITY): Payer: Self-pay | Admitting: Dentistry

## 2015-01-31 ENCOUNTER — Encounter (HOSPITAL_COMMUNITY): Payer: Self-pay | Admitting: Dentistry

## 2015-01-31 VITALS — BP 134/61 | HR 50 | Temp 98.0°F

## 2015-01-31 DIAGNOSIS — K08109 Complete loss of teeth, unspecified cause, unspecified class: Secondary | ICD-10-CM

## 2015-01-31 DIAGNOSIS — C9000 Multiple myeloma not having achieved remission: Secondary | ICD-10-CM

## 2015-01-31 DIAGNOSIS — Z972 Presence of dental prosthetic device (complete) (partial): Secondary | ICD-10-CM

## 2015-01-31 DIAGNOSIS — K0820 Unspecified atrophy of edentulous alveolar ridge: Secondary | ICD-10-CM

## 2015-01-31 DIAGNOSIS — Z463 Encounter for fitting and adjustment of dental prosthetic device: Secondary | ICD-10-CM

## 2015-01-31 NOTE — Patient Instructions (Signed)
Return To clinic in 6 months as scheduled. Call if problems arise before then. Keep dentures out if sore spots arise. Use salt water rinses as needed to aid healing. Dr. Enrique Sack

## 2015-01-31 NOTE — Progress Notes (Signed)
01/31/2015  Patient Name:   Calvin Mccormick Date of Birth:   Jun 01, 1945 Medical Record Number: [id number]  BP 134/61 mmHg  Pulse 50  Temp(Src) 98 F (36.7 C) (Oral)  [name] presents for evaluation of upper and lower complete dentures. SUBJECTIVE: Patient is not complaining of any denture irritation. Patient is still having some difficulty eating with his dentures. "But I'm getting better at it". OBJECTIVE: There is no sign of denture irritation or erythema. Normal saliva is noted. Atrophy of alveolar ridges noted.  Procedure: Pressure indicating paste was applied to the dentures. Adjustments were made as needed. [place] Occlusion evaluated and adjustments made as needed for Centric Relation and protrusive strokes. Patient still has a tendency to protrude to End to End position.  Patient again was instructed on finding maximum intercuspation position.  Patient accepts results. Patient to keep dentures out if sore spots develop. Use salt water rinses as needed to aid healing. Return to clinic as scheduled for denture adjustment.   Call if problems arise before then.  Lenn Cal, DDS

## 2015-02-08 ENCOUNTER — Other Ambulatory Visit: Payer: Self-pay | Admitting: *Deleted

## 2015-02-08 ENCOUNTER — Telehealth: Payer: Self-pay | Admitting: *Deleted

## 2015-02-08 MED ORDER — LENALIDOMIDE 5 MG PO CAPS: 5.0000 mg | ORAL_CAPSULE | Freq: Every day | ORAL | Status: DC

## 2015-02-08 NOTE — Telephone Encounter (Signed)
"  I need the Revlimid refilled.  On the seven days rest.  Will need restart next Tuesday.  Please send refill to [facility] for quick. Delivery."

## 2015-02-08 NOTE — Telephone Encounter (Signed)
Informed pt of Refill Revlimid sent to [facility] and completed online [place] for pt while on the phone.

## 2015-02-08 NOTE — Telephone Encounter (Signed)
Any signs of refill prescription?

## 2015-02-09 ENCOUNTER — Other Ambulatory Visit (HOSPITAL_BASED_OUTPATIENT_CLINIC_OR_DEPARTMENT_OTHER): Payer: Medicare PPO

## 2015-02-09 DIAGNOSIS — C9000 Multiple myeloma not having achieved remission: Secondary | ICD-10-CM

## 2015-02-09 DIAGNOSIS — N183 Chronic kidney disease, stage 3 unspecified: Secondary | ICD-10-CM

## 2015-02-09 DIAGNOSIS — N189 Chronic kidney disease, unspecified: Secondary | ICD-10-CM

## 2015-02-09 DIAGNOSIS — D631 Anemia in chronic kidney disease: Secondary | ICD-10-CM

## 2015-02-09 DIAGNOSIS — D539 Nutritional anemia, unspecified: Secondary | ICD-10-CM

## 2015-02-09 LAB — CBC WITH DIFFERENTIAL/PLATELET
BASO%: 1.5 % (ref 0.0–2.0)
BASOS ABS: 0.1 10*3/uL (ref 0.0–0.1)
EOS ABS: 0.5 10*3/uL (ref 0.0–0.5)
EOS%: 8.7 % — ABNORMAL HIGH (ref 0.0–7.0)
HEMATOCRIT: 29.4 % — AB (ref 38.4–49.9)
HEMOGLOBIN: 9.6 g/dL — AB (ref 13.0–17.1)
LYMPH#: 2.3 10*3/uL (ref 0.9–3.3)
LYMPH%: 41.5 % (ref 14.0–49.0)
MCH: 28.4 pg (ref 27.2–33.4)
MCHC: 32.7 g/dL (ref 32.0–36.0)
MCV: 87 fL (ref 79.3–98.0)
MONO#: 0.5 10*3/uL (ref 0.1–0.9)
MONO%: 8.9 % (ref 0.0–14.0)
NEUT#: 2.2 10*3/uL (ref 1.5–6.5)
NEUT%: 39.4 % (ref 39.0–75.0)
Platelets: 242 10*3/uL (ref 140–400)
RBC: 3.38 10*6/uL — ABNORMAL LOW (ref 4.20–5.82)
RDW: 16.7 % — AB (ref 11.0–14.6)
WBC: 5.5 10*3/uL (ref 4.0–10.3)

## 2015-02-09 LAB — COMPREHENSIVE METABOLIC PANEL
ALBUMIN: 3.6 g/dL (ref 3.5–5.0)
ALK PHOS: 80 U/L (ref 40–150)
ALT: 22 U/L (ref 0–55)
ANION GAP: 10 meq/L (ref 3–11)
AST: 21 U/L (ref 5–34)
BILIRUBIN TOTAL: 0.63 mg/dL (ref 0.20–1.20)
BUN: 18.3 mg/dL (ref 7.0–26.0)
CO2: 21 mEq/L — ABNORMAL LOW (ref 22–29)
Calcium: 9 mg/dL (ref 8.4–10.4)
Chloride: 106 mEq/L (ref 98–109)
Creatinine: 1.9 mg/dL — ABNORMAL HIGH (ref 0.7–1.3)
EGFR: 40 mL/min/{1.73_m2} — AB (ref >90)
GLUCOSE: 83 mg/dL (ref 70–140)
Potassium: 4.3 mEq/L (ref 3.5–5.1)
Sodium: 137 mEq/L (ref 136–145)
TOTAL PROTEIN: 7.7 g/dL (ref 6.4–8.3)

## 2015-02-09 LAB — IRON AND TIBC
%SAT: 15 % — ABNORMAL LOW (ref 20–55)
IRON: 49 ug/dL (ref 42–163)
TIBC: 333 ug/dL (ref 202–409)
UIBC: 284 ug/dL (ref 117–376)

## 2015-02-09 LAB — FERRITIN: FERRITIN: 45 ng/mL (ref 22–316)

## 2015-02-10 LAB — KAPPA/LAMBDA LIGHT CHAINS
Ig Kappa Free Light Chain: 311.37 mg/L — ABNORMAL HIGH (ref 3.30–19.40)
Ig Lambda Free Light Chain: 21.3 mg/L (ref 5.71–26.30)
KAPPA/LAMBDA FLC RATIO: 14.62 — AB (ref 0.26–1.65)

## 2015-02-10 LAB — VITAMIN B12: Vitamin B12: 434 pg/mL (ref 211–946)

## 2015-02-13 LAB — MULTIPLE MYELOMA PANEL, SERUM
ALPHA 1: 0.3 g/dL (ref 0.0–0.4)
ALPHA2 GLOB SERPL ELPH-MCNC: 0.7 g/dL (ref 0.4–1.0)
Albumin SerPl Elph-Mcnc: 3.3 g/dL (ref 2.9–4.4)
Albumin/Glob SerPl: 1 (ref 0.7–1.7)
B-Globulin SerPl Elph-Mcnc: 1.1 g/dL (ref 0.7–1.3)
Gamma Glob SerPl Elph-Mcnc: 1.4 g/dL (ref 0.4–1.8)
Globulin, Total: 3.5 g/dL (ref 2.2–3.9)
IGM (IMMUNOGLOBIN M), SRM: 15 mg/dL (ref 15–143)
IgA, Qn, Serum: 91 mg/dL (ref 61–437)
M Protein SerPl Elph-Mcnc: 1.1 g/dL — ABNORMAL HIGH
TOTAL PROTEIN: 6.8 g/dL (ref 6.0–8.5)

## 2015-02-16 ENCOUNTER — Encounter: Payer: Self-pay | Admitting: Hematology and Oncology

## 2015-02-16 ENCOUNTER — Telehealth: Payer: Self-pay | Admitting: Hematology and Oncology

## 2015-02-16 ENCOUNTER — Ambulatory Visit (HOSPITAL_BASED_OUTPATIENT_CLINIC_OR_DEPARTMENT_OTHER): Payer: Medicare PPO | Admitting: Hematology and Oncology

## 2015-02-16 ENCOUNTER — Ambulatory Visit (HOSPITAL_BASED_OUTPATIENT_CLINIC_OR_DEPARTMENT_OTHER): Payer: Medicare PPO

## 2015-02-16 VITALS — BP 133/59 | HR 65 | Temp 97.9°F | Resp 16 | Ht 71.0 in | Wt 174.0 lb

## 2015-02-16 DIAGNOSIS — N189 Chronic kidney disease, unspecified: Secondary | ICD-10-CM

## 2015-02-16 DIAGNOSIS — N183 Chronic kidney disease, stage 3 unspecified: Secondary | ICD-10-CM

## 2015-02-16 DIAGNOSIS — C9000 Multiple myeloma not having achieved remission: Secondary | ICD-10-CM

## 2015-02-16 DIAGNOSIS — C9001 Multiple myeloma in remission: Secondary | ICD-10-CM

## 2015-02-16 DIAGNOSIS — D631 Anemia in chronic kidney disease: Secondary | ICD-10-CM | POA: Diagnosis not present

## 2015-02-16 MED ORDER — SODIUM CHLORIDE 0.9 % IV SOLN
Freq: Once | INTRAVENOUS | Status: AC
  Administered 2015-02-16: 13:00:00 via INTRAVENOUS

## 2015-02-16 MED ORDER — HEPARIN SOD (PORK) LOCK FLUSH 100 UNIT/ML IV SOLN
500.0000 [IU] | Freq: Once | INTRAVENOUS | Status: AC | PRN
  Administered 2015-02-16: 500 [IU]
  Filled 2015-02-16: qty 5

## 2015-02-16 MED ORDER — ZOLEDRONIC ACID 4 MG/5ML IV CONC
3.0000 mg | Freq: Once | INTRAVENOUS | Status: AC
  Administered 2015-02-16: 3 mg via INTRAVENOUS
  Filled 2015-02-16: qty 3.75

## 2015-02-16 MED ORDER — SODIUM CHLORIDE 0.9 % IJ SOLN
10.0000 mL | INTRAMUSCULAR | Status: DC | PRN
  Administered 2015-02-16: 10 mL
  Filled 2015-02-16: qty 10

## 2015-02-16 NOTE — Telephone Encounter (Signed)
Appointments made and avs printed for patient °

## 2015-02-16 NOTE — Assessment & Plan Note (Signed)
This is chronic in nature. It is stable. Recommend observation only. We'll give him a reduced dose Zometa twice a year due to chronic renal failure    

## 2015-02-16 NOTE — Assessment & Plan Note (Signed)
His recent blood work shows stable disease. We will continue Revlimid for now and Zometa at reduced dose every 6 months. He will continue calcium, vitamin D and aspirin. He denies recent dental problems

## 2015-02-16 NOTE — Assessment & Plan Note (Signed)
His anemia is stable He is not symptomatic. Recommend observation only. 

## 2015-02-16 NOTE — Progress Notes (Signed)
[place] OFFICE PROGRESS NOTE  Patient Care Team: Lucianne Lei, MD as PCP - General (Family Medicine) Heath Lark, MD as Consulting Physician (Hematology and Oncology)  SUMMARY OF ONCOLOGIC HISTORY:   Multiple myeloma ([place])   12/12/2010 Initial Diagnosis Multiple myeloma   02/16/2014 Imaging Skeletal survey showed diffuse osteopenia   03/02/2014 Bone Marrow Biopsy Accession: [id number] BM biopsy showed only 5 % plasma cells. However, the biopsy was difficult and the bone was fragmented during the procedure. Cytogenetics and FISH study is normal   03/03/2014 Procedure he has port placement   03/10/2014 - 05/19/2014 Chemotherapy he received elotuzumab and revlimid   05/20/2014 -  Chemotherapy Treatment is switched to maintenance Revlimid only   11/15/2014 Adverse Reaction He has mild worsening anemia. Does of Revlimid reduced to 5 mg 21 days on, 7 days off    INTERVAL HISTORY: Please see below for problem oriented charting. He returns for further follow-up. He feels well. Denies recent infection. No bone pain or lesions or fractures. No recent dental issue. The patient denies any recent signs or symptoms of bleeding such as spontaneous epistaxis, hematuria or hematochezia.   REVIEW OF SYSTEMS:   Constitutional: Denies fevers, chills or abnormal weight loss Eyes: Denies blurriness of vision Ears, nose, mouth, throat, and face: Denies mucositis or sore throat Respiratory: Denies cough, dyspnea or wheezes Cardiovascular: Denies palpitation, chest discomfort or lower extremity swelling Gastrointestinal:  Denies nausea, heartburn or change in bowel habits Skin: Denies abnormal skin rashes Lymphatics: Denies new lymphadenopathy or easy bruising Neurological:Denies numbness, tingling or new weaknesses Behavioral/Psych: Mood is stable, no new changes  All other systems were reviewed with the patient and are negative.  I have reviewed the past medical history, past surgical  history, social history and family history with the patient and they are unchanged from previous note.  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  MEDICATIONS:  Current Outpatient Prescriptions  Medication Sig Dispense Refill  . allopurinol (ZYLOPRIM) 100 MG tablet Take 100 mg by mouth 2 (two) times daily.     Marland Kitchen amLODipine (NORVASC) 10 MG tablet TAKE 1 TABLET BY MOUTH EVERY DAY 30 tablet 1  . aspirin EC 81 MG tablet Take 81 mg by mouth daily.    Marland Kitchen atorvastatin (LIPITOR) 20 MG tablet Take 20 mg by mouth at bedtime.     . brimonidine (ALPHAGAN) 0.15 % ophthalmic solution Place 1 drop into the right eye 3 (three) times daily.    . cholecalciferol (VITAMIN D) 1000 UNITS tablet Take 1,000 Units by mouth daily.    . dorzolamide-timolol (COSOPT) 22.3-6.8 MG/ML ophthalmic solution Place 1 drop into both eyes 2 (two) times daily.     . fluorometholone (FML) 0.1 % ophthalmic suspension Place 1 drop into both eyes daily.    . hydrochlorothiazide (HYDRODIURIL) 25 MG tablet TAKE 1 TABLET BY MOUTH EVERY DAY 90 tablet 4  . KLOR-CON M20 20 MEQ tablet TAKE 1 TABLET BY MOUTH EVERY DAY 90 tablet 1  . latanoprost (XALATAN) 0.005 % ophthalmic solution Place 1 drop into the right eye at bedtime.    Marland Kitchen lenalidomide (REVLIMID) 5 MG capsule Take 1 capsule (5 mg total) by mouth daily. Take 1 capsule daily for 21 days and then rest 7 days 21 capsule 9  . lidocaine-prilocaine (EMLA) cream Apply 1 application topically as needed. 30 g 1  . loperamide (IMODIUM A-D) 2 MG tablet 1 po after each watery BM.  Maximum 6 per 24hrs. 30 tablet PRN  .  oxyCODONE-acetaminophen (PERCOCET) 5-325 MG per tablet Take one or two tablets by mouth every 4-6 hours as needed for pain. 40 tablet 0  . PRESCRIPTION MEDICATION [facility] Antibody Plan    . VOLTAREN 1 % GEL APPLY TO AFFECTED REGION 2 INCHES 4 TIMES A DAY  5  . ondansetron (ZOFRAN) 8 MG tablet Take 1 tablet (8 mg total) by mouth 3 (three) times daily as needed for nausea or  vomiting. (Patient not taking: Reported on 02/16/2015) 30 tablet 1   No current facility-administered medications for this visit.    PHYSICAL EXAMINATION: ECOG PERFORMANCE STATUS: 1 - Symptomatic but completely ambulatory  Filed Vitals:   02/16/15 1156  BP: 133/59  Pulse: 65  Temp: 97.9 F (36.6 C)  Resp: 16   Filed Weights   02/16/15 1156  Weight: 174 lb (78.926 kg)    GENERAL:alert, no distress and comfortable SKIN: skin color, texture, turgor are normal, no rashes or significant lesions EYES: normal, Conjunctiva are pink and non-injected, sclera clear Musculoskeletal:no cyanosis of digits and no clubbing  NEURO: alert & oriented x 3 with fluent speech, no focal motor/sensory deficits  LABORATORY DATA:  I have reviewed the data as listed    Component Value Date/Time   NA 137 02/09/2015 1058   NA 137 05/26/2014 1150   K 4.3 02/09/2015 1058   K 4.4 05/26/2014 1150   CL 103 05/26/2014 1150   CL 107 06/29/2012 1131   CO2 21* 02/09/2015 1058   CO2 24 05/26/2014 1150   GLUCOSE 83 02/09/2015 1058   GLUCOSE 120* 05/26/2014 1150   GLUCOSE 143* 06/29/2012 1131   BUN 18.3 02/09/2015 1058   BUN 24* 05/26/2014 1150   CREATININE 1.9* 02/09/2015 1058   CREATININE 1.73* 05/26/2014 1150   CREATININE 2.03* 12/14/2010 1114   CALCIUM 9.0 02/09/2015 1058   CALCIUM 9.3 05/26/2014 1150   PROT 7.7 02/09/2015 1058   PROT 6.8 02/09/2015 1058   PROT 7.1 04/21/2014 1030   ALBUMIN 3.6 02/09/2015 1058   ALBUMIN 3.4* 04/21/2014 1030   AST 21 02/09/2015 1058   AST 19 04/21/2014 1030   ALT 22 02/09/2015 1058   ALT 22 04/21/2014 1030   ALKPHOS 80 02/09/2015 1058   ALKPHOS 84 04/21/2014 1030   BILITOT 0.63 02/09/2015 1058   BILITOT 0.4 04/21/2014 1030   GFRNONAA 38* 05/26/2014 1150   GFRAA 44* 05/26/2014 1150    No results found for: SPEP, UPEP  Lab Results  Component Value Date   WBC 5.5 02/09/2015   NEUTROABS 2.2 02/09/2015   HGB 9.6* 02/09/2015   HCT 29.4* 02/09/2015   MCV  87.0 02/09/2015   PLT 242 02/09/2015      Chemistry      Component Value Date/Time   NA 137 02/09/2015 1058   NA 137 05/26/2014 1150   K 4.3 02/09/2015 1058   K 4.4 05/26/2014 1150   CL 103 05/26/2014 1150   CL 107 06/29/2012 1131   CO2 21* 02/09/2015 1058   CO2 24 05/26/2014 1150   BUN 18.3 02/09/2015 1058   BUN 24* 05/26/2014 1150   CREATININE 1.9* 02/09/2015 1058   CREATININE 1.73* 05/26/2014 1150   CREATININE 2.03* 12/14/2010 1114      Component Value Date/Time   CALCIUM 9.0 02/09/2015 1058   CALCIUM 9.3 05/26/2014 1150   ALKPHOS 80 02/09/2015 1058   ALKPHOS 84 04/21/2014 1030   AST 21 02/09/2015 1058   AST 19 04/21/2014 1030   ALT 22 02/09/2015 1058  ALT 22 04/21/2014 1030   BILITOT 0.63 02/09/2015 1058   BILITOT 0.4 04/21/2014 1030     ASSESSMENT & PLAN:  Multiple myeloma His recent blood work shows stable disease. We will continue Revlimid for now and Zometa at reduced dose every 6 months. He will continue calcium, vitamin D and aspirin. He denies recent dental problems  CKD (chronic kidney disease), stage III This is chronic in nature. It is stable. Recommend observation only. We'll give him a reduced dose Zometa twice a year due to chronic renal failure     Anemia in chronic renal disease  His anemia is stable He is not symptomatic. Recommend observation only.   Orders Placed This Encounter  Procedures  . CBC with Differential/Platelet    Standing Status: Future     Number of Occurrences:      Standing Expiration Date: 03/22/2016  . Comprehensive metabolic panel    Standing Status: Future     Number of Occurrences:      Standing Expiration Date: 03/22/2016  . Kappa/lambda light chains    Standing Status: Future     Number of Occurrences:      Standing Expiration Date: 03/22/2016  . Multiple Myeloma Panel (SPEP&IFE w/QIG)    Standing Status: Future     Number of Occurrences:      Standing Expiration Date: 03/22/2016   All questions were  answered. The patient knows to call the clinic with any problems, questions or concerns. No barriers to learning was detected. I spent 15 minutes counseling the patient face to face. The total time spent in the appointment was 20 minutes and more than 50% was on counseling and review of test results     [facility], Kartel Wolbert, MD 02/16/2015 12:24 PM

## 2015-02-16 NOTE — Patient Instructions (Signed)

## 2015-03-07 ENCOUNTER — Other Ambulatory Visit: Payer: Self-pay | Admitting: *Deleted

## 2015-03-07 MED ORDER — LENALIDOMIDE 5 MG PO CAPS: 5.0000 mg | ORAL_CAPSULE | Freq: Every day | ORAL | Status: DC

## 2015-03-13 ENCOUNTER — Telehealth: Payer: Self-pay | Admitting: *Deleted

## 2015-03-13 NOTE — Telephone Encounter (Signed)
Faxed Co-pay assistance program- Patient Enrollment Application to the [employer]

## 2015-03-30 ENCOUNTER — Other Ambulatory Visit: Payer: Self-pay | Admitting: *Deleted

## 2015-03-30 MED ORDER — LENALIDOMIDE 5 MG PO CAPS: 5.0000 mg | ORAL_CAPSULE | Freq: Every day | ORAL | Status: DC

## 2015-03-30 NOTE — Telephone Encounter (Signed)
Call from [facility] states pt calling for refill on Revlimid,  Due to start next week.    Refill sent electronically.  [employer] [id number]

## 2015-04-07 ENCOUNTER — Emergency Department (HOSPITAL_COMMUNITY): Payer: Medicare PPO

## 2015-04-07 ENCOUNTER — Encounter (HOSPITAL_COMMUNITY): Payer: Self-pay | Admitting: Emergency Medicine

## 2015-04-07 ENCOUNTER — Inpatient Hospital Stay (HOSPITAL_COMMUNITY)
Admission: EM | Admit: 2015-04-07 | Discharge: 2015-04-10 | DRG: 682 | Disposition: A | Discharge status: Home / Self Care | Payer: Medicare PPO | Attending: Internal Medicine | Admitting: Internal Medicine

## 2015-04-07 DIAGNOSIS — R112 Nausea with vomiting, unspecified: Secondary | ICD-10-CM

## 2015-04-07 DIAGNOSIS — N19 Unspecified kidney failure: Secondary | ICD-10-CM

## 2015-04-07 DIAGNOSIS — E785 Hyperlipidemia, unspecified: Secondary | ICD-10-CM | POA: Diagnosis present

## 2015-04-07 DIAGNOSIS — T50995A Adverse effect of other drugs, medicaments and biological substances, initial encounter: Secondary | ICD-10-CM | POA: Diagnosis present

## 2015-04-07 DIAGNOSIS — E876 Hypokalemia: Secondary | ICD-10-CM | POA: Diagnosis present

## 2015-04-07 DIAGNOSIS — R197 Diarrhea, unspecified: Secondary | ICD-10-CM | POA: Diagnosis present

## 2015-04-07 DIAGNOSIS — D899 Disorder involving the immune mechanism, unspecified: Secondary | ICD-10-CM | POA: Diagnosis present

## 2015-04-07 DIAGNOSIS — I129 Hypertensive chronic kidney disease with stage 1 through stage 4 chronic kidney disease, or unspecified chronic kidney disease: Secondary | ICD-10-CM | POA: Diagnosis present

## 2015-04-07 DIAGNOSIS — C9002 Multiple myeloma in relapse: Secondary | ICD-10-CM | POA: Diagnosis present

## 2015-04-07 DIAGNOSIS — N183 Chronic kidney disease, stage 3 unspecified: Secondary | ICD-10-CM | POA: Diagnosis present

## 2015-04-07 DIAGNOSIS — C9000 Multiple myeloma not having achieved remission: Secondary | ICD-10-CM | POA: Diagnosis present

## 2015-04-07 DIAGNOSIS — E872 Acidosis, unspecified: Secondary | ICD-10-CM

## 2015-04-07 DIAGNOSIS — I10 Essential (primary) hypertension: Secondary | ICD-10-CM | POA: Diagnosis present

## 2015-04-07 DIAGNOSIS — J690 Pneumonitis due to inhalation of food and vomit: Secondary | ICD-10-CM | POA: Diagnosis present

## 2015-04-07 DIAGNOSIS — K521 Toxic gastroenteritis and colitis: Secondary | ICD-10-CM | POA: Diagnosis present

## 2015-04-07 DIAGNOSIS — N179 Acute kidney failure, unspecified: Principal | ICD-10-CM | POA: Diagnosis present

## 2015-04-07 DIAGNOSIS — E860 Dehydration: Secondary | ICD-10-CM | POA: Diagnosis present

## 2015-04-07 HISTORY — DX: Malignant (primary) neoplasm, unspecified: C80.1

## 2015-04-07 LAB — I-STAT CHEM 8, ED
BUN: 53 mg/dL — AB (ref 6–20)
CALCIUM ION: 0.86 mmol/L — AB (ref 1.13–1.30)
Chloride: 97 mmol/L — ABNORMAL LOW (ref 101–111)
Creatinine, Ser: 3 mg/dL — ABNORMAL HIGH (ref 0.61–1.24)
GLUCOSE: 119 mg/dL — AB (ref 65–99)
HEMATOCRIT: 41 % (ref 39.0–52.0)
Hemoglobin: 13.9 g/dL (ref 13.0–17.0)
POTASSIUM: 3.1 mmol/L — AB (ref 3.5–5.1)
SODIUM: 137 mmol/L (ref 135–145)
TCO2: 21 mmol/L (ref 0–100)

## 2015-04-07 LAB — CBC
HEMATOCRIT: 34 % — AB (ref 39.0–52.0)
HEMOGLOBIN: 11.7 g/dL — AB (ref 13.0–17.0)
MCH: 28.4 pg (ref 26.0–34.0)
MCHC: 34.4 g/dL (ref 30.0–36.0)
MCV: 82.5 fL (ref 78.0–100.0)
Platelets: 286 10*3/uL (ref 150–400)
RBC: 4.12 MIL/uL — AB (ref 4.22–5.81)
RDW: 16.2 % — ABNORMAL HIGH (ref 11.5–15.5)
WBC: 8 10*3/uL (ref 4.0–10.5)

## 2015-04-07 LAB — COMPREHENSIVE METABOLIC PANEL
ALT: 45 U/L (ref 17–63)
ANION GAP: 17 — AB (ref 5–15)
AST: 43 U/L — ABNORMAL HIGH (ref 15–41)
Albumin: 4 g/dL (ref 3.5–5.0)
Alkaline Phosphatase: 69 U/L (ref 38–126)
BUN: 60 mg/dL — ABNORMAL HIGH (ref 6–20)
CHLORIDE: 100 mmol/L — AB (ref 101–111)
CO2: 22 mmol/L (ref 22–32)
Calcium: 8.2 mg/dL — ABNORMAL LOW (ref 8.9–10.3)
Creatinine, Ser: 2.93 mg/dL — ABNORMAL HIGH (ref 0.61–1.24)
GFR calc non Af Amer: 20 mL/min — ABNORMAL LOW (ref >60)
GFR, EST AFRICAN AMERICAN: 23 mL/min — AB (ref >60)
Glucose, Bld: 124 mg/dL — ABNORMAL HIGH (ref 65–99)
POTASSIUM: 3.2 mmol/L — AB (ref 3.5–5.1)
SODIUM: 139 mmol/L (ref 135–145)
Total Bilirubin: 0.9 mg/dL (ref 0.3–1.2)
Total Protein: 9.2 g/dL — ABNORMAL HIGH (ref 6.5–8.1)

## 2015-04-07 LAB — LIPASE, BLOOD: LIPASE: 94 U/L — AB (ref 11–51)

## 2015-04-07 LAB — I-STAT CG4 LACTIC ACID, ED: Lactic Acid, Venous: 3.5 mmol/L (ref 0.5–2.0)

## 2015-04-07 MED ORDER — ONDANSETRON 4 MG PO TBDP
4.0000 mg | ORAL_TABLET | Freq: Once | ORAL | Status: AC | PRN
  Administered 2015-04-07: 4 mg via ORAL
  Filled 2015-04-07: qty 1

## 2015-04-07 MED ORDER — SODIUM CHLORIDE 0.9 % IV BOLUS (SEPSIS)
1000.0000 mL | Freq: Once | INTRAVENOUS | Status: AC
  Administered 2015-04-07: 1000 mL via INTRAVENOUS

## 2015-04-07 MED ORDER — SODIUM CHLORIDE 0.9 % IV BOLUS (SEPSIS)
1000.0000 mL | Freq: Once | INTRAVENOUS | Status: AC
  Administered 2015-04-08: 1000 mL via INTRAVENOUS

## 2015-04-07 NOTE — ED Notes (Signed)
Pt from home with c/o nausea and and more than 20 episodes of emesis. Pt denies diarrhea or urinary symptoms. Pt states he had had fever and chills x 1 week as well.
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
## 2015-04-07 NOTE — ED Notes (Signed)
Nurse starting IV, drawing labs 

## 2015-04-08 ENCOUNTER — Emergency Department (HOSPITAL_COMMUNITY): Payer: Medicare PPO

## 2015-04-08 ENCOUNTER — Encounter (HOSPITAL_COMMUNITY): Payer: Self-pay | Admitting: Radiology

## 2015-04-08 DIAGNOSIS — N183 Chronic kidney disease, stage 3 (moderate): Secondary | ICD-10-CM | POA: Diagnosis present

## 2015-04-08 DIAGNOSIS — N179 Acute kidney failure, unspecified: Secondary | ICD-10-CM | POA: Diagnosis present

## 2015-04-08 DIAGNOSIS — J690 Pneumonitis due to inhalation of food and vomit: Secondary | ICD-10-CM | POA: Diagnosis present

## 2015-04-08 DIAGNOSIS — E872 Acidosis, unspecified: Secondary | ICD-10-CM

## 2015-04-08 DIAGNOSIS — E860 Dehydration: Secondary | ICD-10-CM

## 2015-04-08 DIAGNOSIS — E876 Hypokalemia: Secondary | ICD-10-CM

## 2015-04-08 DIAGNOSIS — K521 Toxic gastroenteritis and colitis: Secondary | ICD-10-CM | POA: Diagnosis present

## 2015-04-08 DIAGNOSIS — R197 Diarrhea, unspecified: Secondary | ICD-10-CM | POA: Diagnosis not present

## 2015-04-08 DIAGNOSIS — I10 Essential (primary) hypertension: Secondary | ICD-10-CM

## 2015-04-08 DIAGNOSIS — I129 Hypertensive chronic kidney disease with stage 1 through stage 4 chronic kidney disease, or unspecified chronic kidney disease: Secondary | ICD-10-CM | POA: Diagnosis present

## 2015-04-08 DIAGNOSIS — T50995A Adverse effect of other drugs, medicaments and biological substances, initial encounter: Secondary | ICD-10-CM | POA: Diagnosis present

## 2015-04-08 DIAGNOSIS — D899 Disorder involving the immune mechanism, unspecified: Secondary | ICD-10-CM | POA: Diagnosis present

## 2015-04-08 DIAGNOSIS — E785 Hyperlipidemia, unspecified: Secondary | ICD-10-CM | POA: Diagnosis present

## 2015-04-08 DIAGNOSIS — R112 Nausea with vomiting, unspecified: Secondary | ICD-10-CM | POA: Diagnosis present

## 2015-04-08 DIAGNOSIS — R1110 Vomiting, unspecified: Secondary | ICD-10-CM

## 2015-04-08 DIAGNOSIS — C9000 Multiple myeloma not having achieved remission: Secondary | ICD-10-CM | POA: Diagnosis present

## 2015-04-08 DIAGNOSIS — N19 Unspecified kidney failure: Secondary | ICD-10-CM | POA: Insufficient documentation

## 2015-04-08 LAB — BASIC METABOLIC PANEL
ANION GAP: 9 (ref 5–15)
BUN: 39 mg/dL — ABNORMAL HIGH (ref 6–20)
CHLORIDE: 109 mmol/L (ref 101–111)
CO2: 20 mmol/L — AB (ref 22–32)
CREATININE: 2.16 mg/dL — AB (ref 0.61–1.24)
Calcium: 6.8 mg/dL — ABNORMAL LOW (ref 8.9–10.3)
GFR calc non Af Amer: 29 mL/min — ABNORMAL LOW (ref >60)
GFR, EST AFRICAN AMERICAN: 33 mL/min — AB (ref >60)
Glucose, Bld: 93 mg/dL (ref 65–99)
POTASSIUM: 3.1 mmol/L — AB (ref 3.5–5.1)
SODIUM: 138 mmol/L (ref 135–145)

## 2015-04-08 LAB — URINALYSIS, ROUTINE W REFLEX MICROSCOPIC
Bilirubin Urine: NEGATIVE
GLUCOSE, UA: NEGATIVE mg/dL
Ketones, ur: NEGATIVE mg/dL
LEUKOCYTES UA: NEGATIVE
Nitrite: NEGATIVE
PH: 6 (ref 5.0–8.0)
Protein, ur: 30 mg/dL — AB
SPECIFIC GRAVITY, URINE: 1.013 (ref 1.005–1.030)

## 2015-04-08 LAB — I-STAT CG4 LACTIC ACID, ED: Lactic Acid, Venous: 1.02 mmol/L (ref 0.5–2.0)

## 2015-04-08 LAB — URINE MICROSCOPIC-ADD ON

## 2015-04-08 LAB — PROCALCITONIN: Procalcitonin: <0.1 ng/mL

## 2015-04-08 LAB — MAGNESIUM: Magnesium: 1.3 mg/dL — ABNORMAL LOW (ref 1.7–2.4)

## 2015-04-08 MED ORDER — POTASSIUM CHLORIDE CRYS ER 20 MEQ PO TBCR
20.0000 meq | EXTENDED_RELEASE_TABLET | Freq: Every day | ORAL | Status: DC
  Administered 2015-04-08 – 2015-04-10 (×3): 20 meq via ORAL
  Filled 2015-04-08 (×3): qty 1

## 2015-04-08 MED ORDER — BRIMONIDINE TARTRATE 0.15 % OP SOLN
1.0000 [drp] | Freq: Three times a day (TID) | OPHTHALMIC | Status: DC
  Administered 2015-04-08 – 2015-04-10 (×7): 1 [drp] via OPHTHALMIC
  Filled 2015-04-08: qty 5

## 2015-04-08 MED ORDER — VANCOMYCIN HCL IN DEXTROSE 1-5 GM/200ML-% IV SOLN
1000.0000 mg | Freq: Once | INTRAVENOUS | Status: AC
  Administered 2015-04-08: 1000 mg via INTRAVENOUS
  Filled 2015-04-08: qty 200

## 2015-04-08 MED ORDER — VITAMIN D 1000 UNITS PO TABS
1000.0000 [IU] | ORAL_TABLET | Freq: Every day | ORAL | Status: DC
  Administered 2015-04-08 – 2015-04-10 (×3): 1000 [IU] via ORAL
  Filled 2015-04-08 (×3): qty 1

## 2015-04-08 MED ORDER — PIPERACILLIN-TAZOBACTAM 3.375 G IVPB 30 MIN
3.3750 g | Freq: Once | INTRAVENOUS | Status: AC
  Administered 2015-04-08: 3.375 g via INTRAVENOUS
  Filled 2015-04-08: qty 50

## 2015-04-08 MED ORDER — ATORVASTATIN CALCIUM 20 MG PO TABS
20.0000 mg | ORAL_TABLET | Freq: Every day | ORAL | Status: DC
  Administered 2015-04-08 – 2015-04-09 (×3): 20 mg via ORAL
  Filled 2015-04-08: qty 2
  Filled 2015-04-08: qty 1
  Filled 2015-04-08 (×2): qty 2
  Filled 2015-04-08 (×2): qty 1

## 2015-04-08 MED ORDER — AMLODIPINE BESYLATE 10 MG PO TABS
10.0000 mg | ORAL_TABLET | Freq: Every day | ORAL | Status: DC
  Administered 2015-04-08: 10 mg via ORAL
  Filled 2015-04-08 (×2): qty 1

## 2015-04-08 MED ORDER — FLUOROMETHOLONE 0.1 % OP SUSP
1.0000 [drp] | Freq: Every day | OPHTHALMIC | Status: DC
  Administered 2015-04-08 – 2015-04-10 (×3): 1 [drp] via OPHTHALMIC
  Filled 2015-04-08: qty 5

## 2015-04-08 MED ORDER — VANCOMYCIN HCL IN DEXTROSE 750-5 MG/150ML-% IV SOLN
750.0000 mg | INTRAVENOUS | Status: DC
  Administered 2015-04-09: 750 mg via INTRAVENOUS
  Filled 2015-04-08: qty 150

## 2015-04-08 MED ORDER — ASPIRIN EC 81 MG PO TBEC
81.0000 mg | DELAYED_RELEASE_TABLET | Freq: Every day | ORAL | Status: DC
  Administered 2015-04-08 – 2015-04-10 (×3): 81 mg via ORAL
  Filled 2015-04-08 (×3): qty 1

## 2015-04-08 MED ORDER — MAGNESIUM SULFATE 2 GM/50ML IV SOLN
2.0000 g | Freq: Once | INTRAVENOUS | Status: AC
  Administered 2015-04-08: 2 g via INTRAVENOUS
  Filled 2015-04-08: qty 50

## 2015-04-08 MED ORDER — POTASSIUM CHLORIDE CRYS ER 20 MEQ PO TBCR
40.0000 meq | EXTENDED_RELEASE_TABLET | Freq: Two times a day (BID) | ORAL | Status: DC
  Filled 2015-04-08: qty 2

## 2015-04-08 MED ORDER — POTASSIUM CHLORIDE CRYS ER 20 MEQ PO TBCR
40.0000 meq | EXTENDED_RELEASE_TABLET | Freq: Once | ORAL | Status: AC
  Administered 2015-04-08: 40 meq via ORAL

## 2015-04-08 MED ORDER — VITAMIN D3 25 MCG (1000 UNIT) PO TABS
1000.0000 [IU] | ORAL_TABLET | Freq: Every day | ORAL | Status: DC
  Filled 2015-04-08: qty 1

## 2015-04-08 MED ORDER — ACETAMINOPHEN 325 MG PO TABS
650.0000 mg | ORAL_TABLET | Freq: Four times a day (QID) | ORAL | Status: DC | PRN
  Administered 2015-04-08: 650 mg via ORAL
  Filled 2015-04-08: qty 2

## 2015-04-08 MED ORDER — PIPERACILLIN-TAZOBACTAM 3.375 G IVPB
3.3750 g | Freq: Three times a day (TID) | INTRAVENOUS | Status: DC
  Administered 2015-04-08 – 2015-04-09 (×4): 3.375 g via INTRAVENOUS
  Filled 2015-04-08 (×3): qty 50

## 2015-04-08 MED ORDER — ONDANSETRON HCL 4 MG PO TABS: 4.0000 mg | ORAL_TABLET | Freq: Four times a day (QID) | ORAL | Status: DC | PRN

## 2015-04-08 MED ORDER — ONDANSETRON HCL 4 MG/2ML IJ SOLN
4.0000 mg | Freq: Four times a day (QID) | INTRAMUSCULAR | Status: DC | PRN
Start: 2015-04-08 — End: 2015-04-10
  Administered 2015-04-09: 4 mg via INTRAVENOUS
  Filled 2015-04-08: qty 2

## 2015-04-08 MED ORDER — HEPARIN SODIUM (PORCINE) 5000 UNIT/ML IJ SOLN
5000.0000 [IU] | Freq: Three times a day (TID) | INTRAMUSCULAR | Status: DC
  Administered 2015-04-08 – 2015-04-10 (×7): 5000 [IU] via SUBCUTANEOUS
  Filled 2015-04-08 (×7): qty 1

## 2015-04-08 MED ORDER — ACETAMINOPHEN 650 MG RE SUPP: 650.0000 mg | Freq: Four times a day (QID) | RECTAL | Status: DC | PRN

## 2015-04-08 MED ORDER — SODIUM CHLORIDE 0.9 % IV SOLN
1.0000 g | Freq: Once | INTRAVENOUS | Status: AC
  Administered 2015-04-08: 1 g via INTRAVENOUS
  Filled 2015-04-08: qty 10

## 2015-04-08 MED ORDER — SODIUM CHLORIDE 0.9 % IV BOLUS (SEPSIS)
1000.0000 mL | INTRAVENOUS | Status: AC
  Administered 2015-04-08: 1000 mL via INTRAVENOUS

## 2015-04-08 MED ORDER — SODIUM CHLORIDE 0.9 % IV BOLUS (SEPSIS)
500.0000 mL | INTRAVENOUS | Status: AC
  Administered 2015-04-08: 500 mL via INTRAVENOUS

## 2015-04-08 MED ORDER — LATANOPROST 0.005 % OP SOLN
1.0000 [drp] | Freq: Every day | OPHTHALMIC | Status: DC
  Administered 2015-04-08 – 2015-04-09 (×2): 1 [drp] via OPHTHALMIC
  Filled 2015-04-08: qty 2.5

## 2015-04-08 MED ORDER — IOHEXOL 300 MG/ML  SOLN
50.0000 mL | Freq: Once | INTRAMUSCULAR | Status: AC | PRN
  Administered 2015-04-08: 50 mL via ORAL

## 2015-04-08 MED ORDER — DORZOLAMIDE HCL-TIMOLOL MAL 2-0.5 % OP SOLN
1.0000 [drp] | Freq: Two times a day (BID) | OPHTHALMIC | Status: DC
  Administered 2015-04-08 – 2015-04-10 (×5): 1 [drp] via OPHTHALMIC
  Filled 2015-04-08: qty 10

## 2015-04-08 MED ORDER — SODIUM CHLORIDE 0.9 % IV SOLN
INTRAVENOUS | Status: DC
  Administered 2015-04-08: 1000 mL via INTRAVENOUS

## 2015-04-08 MED ORDER — LIDOCAINE-PRILOCAINE 2.5-2.5 % EX CREA: 1.0000 "application " | TOPICAL_CREAM | CUTANEOUS | Status: DC | PRN

## 2015-04-08 MED ORDER — ALBUTEROL SULFATE (2.5 MG/3ML) 0.083% IN NEBU
2.5000 mg | INHALATION_SOLUTION | RESPIRATORY_TRACT | Status: DC | PRN
  Administered 2015-04-08: 2.5 mg via RESPIRATORY_TRACT
  Filled 2015-04-08: qty 3

## 2015-04-08 MED ORDER — SODIUM CHLORIDE 0.9 % IV SOLN
INTRAVENOUS | Status: DC
  Administered 2015-04-08 – 2015-04-09 (×3): via INTRAVENOUS
  Filled 2015-04-08 (×5): qty 1000

## 2015-04-08 NOTE — H&P (Signed)
[facility] History and Physical  ALPHONZA TRAMELL JQB:[id number] DOB: 1945/01/11 DOA: 04/07/2015  Referring physician: Dr. Canary Brim PCP: Elyn Peers, MD   Chief Complaint: Nausea, vomiting, and diarrhea  HPI:  [name] is a 70 year old male with a past medical history significant for HTN, HLD, CKD stage III, multiple myeloma; who presents with complaints of nausea, vomiting, and diarrhea over the last week. Family notes that he initially had a cough that progressively worsened to the point that he started having vomiting. Patient reported to be unable to keep any solids or liquids down. He has had associated symptoms of fever up to 15F, chills, lower abdominal pain, and diarrhea. Denies any recent antibiotics given in the last month. He is followed by Dr. Alvy Bimler for his multiple myeloma and is currently on Revlimid and receives Zometa infusions every 6 months now. His last infusion was on 02/16/2015  Upon admission patient was evaluated and seen to have CBC within normal limits, BMP, sodium of 137, potassium 3.1, chloride 97, creatinine 3, BUN 53, ionized calcium 0.86, and lactic acid 3.5 on admission. A CT scan was obtained of the abdomen and pelvis while the patient was in the ED which showed no acute abnormalities within the abdomen but suggested possible atypical infection of the lower lungs. Chest x-ray did not show any acute abnormalities.    Review of Systems  Constitutional: Positive for fever, chills and malaise/fatigue.  HENT: Negative for hearing loss.   Respiratory: Positive for cough.   Cardiovascular: Negative for chest pain, claudication and leg swelling.  Gastrointestinal: Positive for nausea, vomiting, abdominal pain and diarrhea.  Genitourinary: Negative for urgency and frequency.  Musculoskeletal: Positive for joint pain. Negative for falls.  Skin: Negative for rash.  Neurological: Positive for weakness and headaches. Negative for sensory change and speech change.   Endo/Heme/Allergies: Negative for environmental allergies and polydipsia.  Psychiatric/Behavioral: Negative for substance abuse. The patient does not have insomnia.          Past Medical History  Diagnosis Date  . Anemia 11/27/2010  . Hypertension, benign essential, goal below 140/90 11/27/2010  . Multiple myeloma without mention of remission 12/12/2010  . CKD (chronic kidney disease), stage III 02/05/2011  . Hyperlipemia   . Diarrhea 06/17/2011    Hospital admission 05/30/11 velcade toxicity? Infectious?  . Chronic renal insufficiency, stage III (moderate) 06/17/2011    Due to myeloma & HTN  . Hypomagnesemia 07/29/2011  . Hypokalemia with normal acid-base balance 07/29/2011  . Seasonal allergies   . 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 Colonoscopy  07/26/2011    Procedure: COLONOSCOPY;  Surgeon: Cleotis Nipper, MD;  Location: [facility];  Service: Endoscopy;  Laterality: N/A;  . Eye surgery      bilateral cataract  surgery  . [place] cath       for chemotherapy- right chest area  . Multiple extractions with alveoloplasty N/A 05/27/2014    Procedure: Extraction of tooth #'s 1,6,7,8,10,11,13,21,22,23,24,25,26,27 and 28 with alveoloplasty;  Surgeon: Lenn Cal, DDS;  Location: [facility];  Service: Oral Surgery;  Laterality: N/A;      Social History:  reports that he has never smoked. He has never used smokeless tobacco. He reports that he does not drink alcohol or use illicit drugs. Where does patient live--home  and with whom if at home? With family   Allergies  Allergen Reactions  . Lactose Intolerance (Gi) Nausea And Vomiting    Pt  said he is not ??    Family History  Problem Relation Age of Onset  . Cancer Brother     lung ca        Prior to Admission medications   Medication Sig Start Date End Date Taking? Authorizing Provider  allopurinol (ZYLOPRIM) 100 MG tablet Take 100 mg by mouth 2 (two) times daily.   12/06/12  Yes Historical Provider, MD  amLODipine (NORVASC) 10 MG tablet TAKE 1 TABLET BY MOUTH EVERY DAY   Yes Annia Belt, MD  aspirin EC 81 MG tablet Take 81 mg by mouth daily.   Yes Historical Provider, MD  atorvastatin (LIPITOR) 20 MG tablet Take 20 mg by mouth at bedtime.    Yes Historical Provider, MD  brimonidine (ALPHAGAN) 0.15 % ophthalmic solution Place 1 drop into the right eye 3 (three) times daily.   Yes Historical Provider, MD  cholecalciferol (VITAMIN D) 1000 UNITS tablet Take 1,000 Units by mouth daily.   Yes Historical Provider, MD  dorzolamide-timolol (COSOPT) 22.3-6.8 MG/ML ophthalmic solution Place 1 drop into both eyes 2 (two) times daily.  07/29/13  Yes Historical Provider, MD  fluorometholone (FML) 0.1 % ophthalmic suspension Place 1 drop into both eyes daily.   Yes Historical Provider, MD  hydrochlorothiazide (HYDRODIURIL) 25 MG tablet TAKE 1 TABLET BY MOUTH EVERY DAY   Yes Annia Belt, MD  KLOR-CON M20 20 MEQ tablet TAKE 1 TABLET BY MOUTH EVERY DAY 09/10/13  Yes Heath Lark, MD  latanoprost (XALATAN) 0.005 % ophthalmic solution Place 1 drop into the right eye at bedtime.   Yes Historical Provider, MD  lidocaine-prilocaine (EMLA) cream Apply 1 application topically as needed. 05/19/14  Yes Heath Lark, MD  loperamide (IMODIUM A-D) 2 MG tablet 1 po after each watery BM.  Maximum 6 per 24hrs. 10/01/11  Yes Annia Belt, MD  lenalidomide (REVLIMID) 5 MG capsule Take 1 capsule (5 mg total) by mouth daily. Take 1 capsule daily for 21 days and then rest 7 days 03/30/15   Heath Lark, MD  ondansetron (ZOFRAN) 8 MG tablet Take 1 tablet (8 mg total) by mouth 3 (three) times daily as needed for nausea or vomiting. Patient not taking: Reported on 02/16/2015 03/11/14   Heath Lark, MD  oxyCODONE-acetaminophen (PERCOCET) 5-325 MG per tablet Take one or two tablets by mouth every 4-6 hours as needed for pain. Patient not taking: Reported on 04/07/2015 05/27/14   Lenn Cal,  DDS     Physical Exam: Filed Vitals:   04/08/15 0220 04/08/15 0226 04/08/15 0234 04/08/15 0242  BP:   126/67   Pulse: 80 66 69 64  Temp:      TempSrc:      Resp: _0 Height:      Weight:      SpO2: 99% 98% 97% 99%     Constitutional: Vital signs reviewed. Patient is elderly male who appears chronically ill Head: Normocephalic and atraumatic  Ear: TM normal bilaterally  Mouth: no erythema or exudates, MMM  Eyes: PERRL, EOMI, conjunctivae normal, No scleral icterus.  Neck: Supple, Trachea midline normal ROM, No JVD, mass, thyromegaly, or carotid bruit present.  Cardiovascular: RRR, S1 normal, S2 normal, no MRG, pulses symmetric and intact bilaterally  Pulmonary/Chest: CTAB, no wheezes, rales, or rhonchi  Abdominal: Soft. Generalized tenderness to palpation of the abdomen. Positive bowel sounds in all 4 quadrants. GU: no CVA tenderness Musculoskeletal: No joint deformities, erythema, or stiffness, ROM full and no nontender Ext:  no edema and no cyanosis, pulses palpable bilaterally (DP and PT)  Hematology: no cervical, inginal, or axillary adenopathy.  Neurological: A&O x3, Strenght is normal and symmetric bilaterally, cranial nerve II-XII are grossly intact, no focal motor deficit, sensory intact to light touch bilaterally.  Skin: Warm, dry and intact. No rash, cyanosis, or clubbing.  Psychiatric: Normal mood and affect. speech and behavior is normal. Judgment and thought content normal. Cognition and memory are normal.      Data Review   Micro Results No results found for this or any previous visit (from the past 240 hour(s)).  Radiology Reports Ct Abdomen Pelvis Wo Contrast  04/08/2015  CLINICAL DATA:  Nausea and vomiting for 7 days. Chills. Diarrhea. Mild lower abdominal pain. Fever. History of multiple myeloma; on chemotherapy. Elevated lipase. Chronic kidney disease. EXAM: CT ABDOMEN AND PELVIS WITHOUT CONTRAST TECHNIQUE: Multidetector CT imaging of the abdomen  and pelvis was performed following the standard protocol without IV contrast. COMPARISON:  07/24/2011. FINDINGS: Lower chest: Bibasilar bronchial wall thickening. Patchy bibasilar peribronchovascular airspace disease. Concurrent mild Tree in bud opacities. Normal heart size without pericardial or pleural effusion. Small hiatal hernia. Hepatobiliary: Normal liver. Normal gallbladder, without biliary ductal dilatation. Pancreas: Normal, without mass or ductal dilatation. Spleen: Normal in size, without focal abnormality. Adrenals/Urinary Tract: Normal adrenal glands. Mild renal cortical thinning bilaterally. Anterior interpolar left renal cyst of 2.5 cm. No hydronephrosis. No hydroureter or ureteric calculi. No bladder calculi. Stomach/Bowel: Normal remainder of the stomach. Normal colon, appendix, and terminal ileum. Normal small bowel. Vascular/Lymphatic: Normal caliber of the aorta and branch vessels. No abdominopelvic adenopathy. Reproductive: Mild prostatomegaly with impression into the urinary bladder. Other: No significant free fluid. Musculoskeletal: Probable bone island in the left iliac, similar. Moderate convex left lumbar spine curvature with advanced spondylosis. Reversal of expected lumbar lordosis. IMPRESSION: 1. No acute process or explanation for abdominal pain/vomiting/nausea. 2. Bibasilar opacities, suspicious for infection (including atypical etiologies) or aspiration. 3. Small hiatal hernia. 4. Prostatomegaly. Electronically Signed   By: Abigail Miyamoto M.D.   On: 04/08/2015 02:02   Dg Chest 2 View  04/07/2015  CLINICAL DATA:  Nausea and vomiting, fever and chills for 1 week. History of multiple myeloma, hypertension. EXAM: CHEST  2 VIEW COMPARISON:  Chest radiograph February 16, 2014 FINDINGS: Cardiomediastinal silhouette is normal. The lungs are clear without pleural effusions or focal consolidations. Trachea projects midline and there is no pneumothorax. Soft tissue planes and included osseous  structures are non-suspicious. Single-lumen RIGHT chest Port-A-Cath distal tip projects in distal superior vena cava. IMPRESSION: No acute cardiopulmonary process. Electronically Signed   By: Elon Alas M.D.   On: 04/07/2015 23:55     CBC  Recent Labs Lab 04/07/15 2300 04/07/15 2319  WBC 8.0  --   HGB 11.7* 13.9  HCT 34.0* 41.0  PLT 286  --   MCV 82.5  --   MCH 28.4  --   MCHC 34.4  --   RDW 16.2*  --     Chemistries   Recent Labs Lab 04/07/15 2300 04/07/15 2319  NA 139 137  K 3.2* 3.1*  CL 100* 97*  CO2 22  --   GLUCOSE 124* 119*  BUN 60* 53*  CREATININE 2.93* 3.00*  CALCIUM 8.2*  --   AST 43*  --   ALT 45  --   ALKPHOS 69  --   BILITOT 0.9  --    ------------------------------------------------------------------------------------------------------------------ estimated creatinine clearance is 23.7 mL/min (by C-G formula based on  Cr of 3). ------------------------------------------------------------------------------------------------------------------ No results for input(s): HGBA1C in the last 72 hours. ------------------------------------------------------------------------------------------------------------------ No results for input(s): CHOL, HDL, LDLCALC, TRIG, CHOLHDL, LDLDIRECT in the last 72 hours. ------------------------------------------------------------------------------------------------------------------ No results for input(s): TSH, T4TOTAL, T3FREE, THYROIDAB in the last 72 hours.  Invalid input(s): FREET3 ------------------------------------------------------------------------------------------------------------------ No results for input(s): VITAMINB12, FOLATE, FERRITIN, TIBC, IRON, RETICCTPCT in the last 72 hours.  Coagulation profile No results for input(s): INR, PROTIME in the last 168 hours.  No results for input(s): DDIMER in the last 72 hours.  Cardiac Enzymes No results for input(s): CKMB, TROPONINI, MYOGLOBIN in the last 168  hours.  Invalid input(s): CK ------------------------------------------------------------------------------------------------------------------ Invalid input(s): POCBNP   CBG: No results for input(s): GLUCAP in the last 168 hours.     EKG: Independently reviewed. Sinus rhythm  Assessment/Plan Acute renal failure on chronic kidney disease stage III: Patient with baseline creatinine around 1.9, but acutely elevated to 3 on admission with a BUN of 53. BUN to creatinine ratio greater than 20 suspect prerenal in nature. Given a at least 1 L , of normal saline IV fluids while in the ED - Admit to MedSurg bed - Normal saline IV fluids at 125 mL per hour - Recheck BMP in a.m. - Held HCTZ  SIRS/Suspected sepsis with unknown source:  Patient presented with elevated lactic acid level of 3.50 with family reports of fever. This could be secondary to patient's dehydration versus underlying infectious cause given patient's immunocompromised state. Currently only other complaints besides nausea vomiting and diarrhea included cough.  CT scan showing some signs of possible infection of the base of the lungs. - Follow blood cultures - Pro calcitonin  - Empirically started on vancomycin and Zosyn in the ED   Intractable nausea, vomiting, and diarrhea: Patient has not had any recent antibiotics per family. - Strict in's and out's - Advance diet as tolerated to heart healthy - Zofran prn  - May consider doing stool studies  Severe dehydration: Secondary to nausea vomiting and diarrhea - IV fluids as seen above  Multiple myeloma: Patient is currently off of Revlmid per family for the next 3 days. - Notify Dr. Biagio Borg which patient is here  Hypertension - Held hydrochlorothiazide - Continue amlodipine  Hypokalemia: Acute. Potassium noted to be 3.1 on admission. - Give 40 mEq of potassium chloride 1 dose now  - Continue to monitor and replace as needed  Hypocalcemia: Acute. Ionized calcium  significantly low at 0.86 - Given 1 g of calcium gluconate IV  - Continue to monitor and replace as needed  Hyperlipidemia - atorvastatin   Code Status:   full Family Communication: bedside Disposition Plan: admit   Total time spent 55 minutes.Greater than 50% of this time was spent in counseling, explanation of diagnosis, planning of further management, and coordination of care  [facility] Pager [phone]  If 7PM-7AM, please contact night-coverage [email or web address] Password [email or web address] 04/08/2015, 3:19 AM

## 2015-04-08 NOTE — Progress Notes (Signed)
PROGRESS NOTE  Calvin Mccormick WTU:[id number] DOB: [birth year] DOA: 04/07/2015 PCP: Elyn Peers, MD Brief History 70 year old male with a history of multiple myeloma, hypertension, CKD stage III, hyperlipidemia presented with one-week history of lower abdominal pain, nausea, vomiting,  and coughing to the point of vomiting.  The patient also complains of chronic "diarrhea"without any hematochezia or melena. He has had some subjective fevers and chills.he denies any chest discomfort, shortness of breath, hemoptysis, dysuria, hematuria. The patient denies any recent antibiotics. The patient follows Dr. Alvy Bimler for her myeloma, and he was started on Revlimid on 03/20/2014.n the emergency department, the patient was found to have a serum creatinine 2.93, lactic acid 3.50 with no significant pyuria and his urinalysis.  Assessment/Plan: Acute on chronic renal failure--CKD stage III -Secondary to volume depletion -Baseline creatinine 1.6-1.9 -Continue intravenous fluid hydration -Discontinue HCTZ  Lactic acidosis -secondary to volume depletion -continue empiric antibiotics pending culture data -plan to discontinue antibiotics in the next 24 hours if cultures are negative and the patient remains hemodynamically stable and afebrile -already given lung findings on CT suspect possible aspiration -continue IVF -repeat lactate am  Intractable nausea and vomiting -This seems to have improved since admission -CT abdomen pelvis negative for acute intra-abdominal findings, that revealed bibasilar bronchial wall thickening with patchy peribronchial airspace disease-->suspect possible aspiration pneumonitis -respiratory panel given the patient's low-grade fever and unrelenting cough  Diarrhea -Likely secondary to the patient's Revlimid  Myeloma -Follows Dr. Alvy Bimler -Presently on Revlimid  Hypertension -Hold amlodipine and monitor clinically  Hyperlipidemia -Continue  statin  Hypokalemia/hypomagnesemia -Replete  Family Communication:   No family at beside Disposition Plan:   Home 1-2 days       Procedures/Studies: Ct Abdomen Pelvis Wo Contrast  04/08/2015  CLINICAL DATA:  Nausea and vomiting for 7 days. Chills. Diarrhea. Mild lower abdominal pain. Fever. History of multiple myeloma; on chemotherapy. Elevated lipase. Chronic kidney disease. EXAM: CT ABDOMEN AND PELVIS WITHOUT CONTRAST TECHNIQUE: Multidetector CT imaging of the abdomen and pelvis was performed following the standard protocol without IV contrast. COMPARISON:  07/24/2011. FINDINGS: Lower chest: Bibasilar bronchial wall thickening. Patchy bibasilar peribronchovascular airspace disease. Concurrent mild Tree in bud opacities. Normal heart size without pericardial or pleural effusion. Small hiatal hernia. Hepatobiliary: Normal liver. Normal gallbladder, without biliary ductal dilatation. Pancreas: Normal, without mass or ductal dilatation. Spleen: Normal in size, without focal abnormality. Adrenals/Urinary Tract: Normal adrenal glands. Mild renal cortical thinning bilaterally. Anterior interpolar left renal cyst of 2.5 cm. No hydronephrosis. No hydroureter or ureteric calculi. No bladder calculi. Stomach/Bowel: Normal remainder of the stomach. Normal colon, appendix, and terminal ileum. Normal small bowel. Vascular/Lymphatic: Normal caliber of the aorta and branch vessels. No abdominopelvic adenopathy. Reproductive: Mild prostatomegaly with impression into the urinary bladder. Other: No significant free fluid. Musculoskeletal: Probable bone island in the left iliac, similar. Moderate convex left lumbar spine curvature with advanced spondylosis. Reversal of expected lumbar lordosis. IMPRESSION: 1. No acute process or explanation for abdominal pain/vomiting/nausea. 2. Bibasilar opacities, suspicious for infection (including atypical etiologies) or aspiration. 3. Small hiatal hernia. 4. Prostatomegaly.  Electronically Signed   By: Abigail Miyamoto M.D.   On: 04/08/2015 02:02   Dg Chest 2 View  04/07/2015  CLINICAL DATA:  Nausea and vomiting, fever and chills for 1 week. History of multiple myeloma, hypertension. EXAM: CHEST  2 VIEW COMPARISON:  Chest radiograph February 16, 2014 FINDINGS: Cardiomediastinal silhouette is normal. The lungs are clear without pleural effusions or  focal consolidations. Trachea projects midline and there is no pneumothorax. Soft tissue planes and included osseous structures are non-suspicious. Single-lumen RIGHT chest Port-A-Cath distal tip projects in distal superior vena cava. IMPRESSION: No acute cardiopulmonary process. Electronically Signed   By: Elon Alas M.D.   On: 04/07/2015 23:55         Subjective: Patient states that nausea and vomiting have improved. Continues to have the stool. Denies any fevers, chills, chest pain, shortness breath, hematemesis, hematochezia, melena, dysuria, hematuria. Denies any headache or neck pain.  Objective: Filed Vitals:   04/08/15 0242 04/08/15 0300 04/08/15 0330 04/08/15 0415  BP:  120/61 129/59 140/54  Pulse: 64 65 62 71  Temp:    98.6 F (37 C)  TempSrc:    Oral  Resp: '16 17 14 18  ' Height:    '5\' 11"'  (1.803 m)  Weight:    78.926 kg (174 lb)  SpO2: 99% 98% 97% 97%    Intake/Output Summary (Last 24 hours) at 04/08/15 0756 Last data filed at 04/08/15 7530  Gross per 24 hour  Intake   3000 ml  Output   1075 ml  Net   1925 ml   Weight change:  Exam:   General:  Pt is alert, follows commands appropriately, not in acute distress  HEENT: No icterus, No thrush, No neck mass, [place]/AT  Cardiovascular: RRR, S1/S2, no rubs, no gallops  Respiratory: bibasilar crackles without wheezing. Good air movement.  Abdomen: Soft/+BS, non tender, non distended, no guarding; no hepatosplenomegaly  Extremities: No edema, No lymphangitis, No petechiae, No rashes, no synovitis  Data Reviewed: Basic Metabolic  Panel:  Recent Labs Lab 04/07/15 2300 04/07/15 2319 04/08/15 0553  NA 139 137  --   K 3.2* 3.1*  --   CL 100* 97*  --   CO2 22  --   --   GLUCOSE 124* 119*  --   BUN 60* 53*  --   CREATININE 2.93* 3.00*  --   CALCIUM 8.2*  --   --   MG  --   --  1.3*   Liver Function Tests:  Recent Labs Lab 04/07/15 2300  AST 43*  ALT 45  ALKPHOS 69  BILITOT 0.9  PROT 9.2*  ALBUMIN 4.0    Recent Labs Lab 04/07/15 2300  LIPASE 94*   No results for input(s): AMMONIA in the last 168 hours. CBC:  Recent Labs Lab 04/07/15 2300 04/07/15 2319  WBC 8.0  --   HGB 11.7* 13.9  HCT 34.0* 41.0  MCV 82.5  --   PLT 286  --    Cardiac Enzymes: No results for input(s): CKTOTAL, CKMB, CKMBINDEX, TROPONINI in the last 168 hours. BNP: Invalid input(s): POCBNP CBG: No results for input(s): GLUCAP in the last 168 hours.  No results found for this or any previous visit (from the past 240 hour(s)).   Scheduled Meds: . amLODipine  10 mg Oral Daily  . aspirin EC  81 mg Oral Daily  . atorvastatin  20 mg Oral QHS  . brimonidine  1 drop Right Eye TID  . cholecalciferol  1,000 Units Oral Daily  . dorzolamide-timolol  1 drop Both Eyes BID  . fluorometholone  1 drop Both Eyes Daily  . heparin  5,000 Units Subcutaneous 3 times per day  . latanoprost  1 drop Right Eye QHS  . piperacillin-tazobactam (ZOSYN)  IV  3.375 g Intravenous 3 times per day  . potassium chloride SA  20 mEq Oral Daily  . [  START ON 04/09/2015] vancomycin  750 mg Intravenous Q24H   Continuous Infusions: . sodium chloride 1,000 mL (04/08/15 0524)     Larkin Alfred, DO  [facility] Pager [phone]  If 7PM-7AM, please contact night-coverage [email or web address] Password [email or web address] 04/08/2015, 7:56 AM   LOS: 0 days

## 2015-04-08 NOTE — Progress Notes (Signed)
Pharmacy Antibiotic Note  Calvin Mccormick is a 70 y.o. male admitted on 04/07/2015 with sepsis.  Pharmacy has been consulted for Vancomycin and Zosyn  dosing.  Plan: Vancomycin 750mg  IV every 24 hours.  Goal trough 15-20 mcg/mL. Zosyn 3.375g IV q8h (4 hour infusion).  Height: 5\' 11"  (180.3 cm) Weight: 174 lb (78.926 kg) IBW/kg (Calculated) : 75.3  Temp (24hrs), Avg:99 F (37.2 C), Min:98.6 F (37 C), Max:99.3 F (37.4 C)   Recent Labs Lab 04/07/15 2300 04/07/15 2319 04/07/15 2330 04/08/15 0231  WBC 8.0  --   --   --   CREATININE 2.93* 3.00*  --   --   LATICACIDVEN  --   --  3.50* 1.02    Estimated Creatinine Clearance: 23.7 mL/min (by C-G formula based on Cr of 3).    Allergies  Allergen Reactions  . Lactose Intolerance (Gi) Nausea And Vomiting    Pt said he is not ??    Antimicrobials this admission: Vancomycin 4/1 >> Zosyn 4/1 >>   Microbiology results: Pending  Thank you for allowing pharmacy to be a part of this patient's care.  Nani Skillern Crowford 04/08/2015 5:59 AM

## 2015-04-08 NOTE — Progress Notes (Signed)
Nursing Note:Pt arrived via stretcher.Pt awake, goes to sleep easily.Alert,Ox4.Family with pt.Pt oriented to room and safety plan.Pt denies pain,nausea or vomiting.IVF infusing.H/o Multiple Myeloma and gout.T-98.6 P-71 R-18 Bp-140/54 PO2 97% on r/a.wbb 

## 2015-04-09 DIAGNOSIS — E860 Dehydration: Secondary | ICD-10-CM

## 2015-04-09 DIAGNOSIS — R112 Nausea with vomiting, unspecified: Secondary | ICD-10-CM

## 2015-04-09 LAB — BASIC METABOLIC PANEL
Anion gap: 6 (ref 5–15)
BUN: 25 mg/dL — AB (ref 6–20)
CHLORIDE: 109 mmol/L (ref 101–111)
CO2: 22 mmol/L (ref 22–32)
CREATININE: 1.91 mg/dL — AB (ref 0.61–1.24)
Calcium: 7.3 mg/dL — ABNORMAL LOW (ref 8.9–10.3)
GFR calc non Af Amer: 33 mL/min — ABNORMAL LOW (ref >60)
GFR, EST AFRICAN AMERICAN: 39 mL/min — AB (ref >60)
Glucose, Bld: 94 mg/dL (ref 65–99)
POTASSIUM: 3.5 mmol/L (ref 3.5–5.1)
SODIUM: 137 mmol/L (ref 135–145)

## 2015-04-09 LAB — LACTIC ACID, PLASMA: Lactic Acid, Venous: 0.8 mmol/L (ref 0.5–2.0)

## 2015-04-09 LAB — MAGNESIUM: MAGNESIUM: 1.4 mg/dL — AB (ref 1.7–2.4)

## 2015-04-09 LAB — URINE CULTURE

## 2015-04-09 MED ORDER — SODIUM CHLORIDE 0.9 % IV SOLN
3.0000 g | Freq: Four times a day (QID) | INTRAVENOUS | Status: DC
  Administered 2015-04-09 – 2015-04-10 (×5): 3 g via INTRAVENOUS
  Filled 2015-04-09 (×5): qty 3

## 2015-04-09 MED ORDER — MAGNESIUM SULFATE 4 GM/100ML IV SOLN
4.0000 g | Freq: Once | INTRAVENOUS | Status: AC
  Administered 2015-04-09: 4 g via INTRAVENOUS
  Filled 2015-04-09: qty 100

## 2015-04-09 NOTE — Progress Notes (Signed)
PROGRESS NOTE  URAL ACREE FKC:[id number] DOB: [birth year] DOA: 04/07/2015 PCP: Elyn Peers, MD  Brief History 70 year old male with a history of multiple myeloma, hypertension, CKD stage III, hyperlipidemia presented with one-week history of lower abdominal pain, nausea, vomiting, and coughing to the point of vomiting. The patient also complains of chronic "diarrhea"without any hematochezia or melena. He has had some subjective fevers and chills.he denies any chest discomfort, shortness of breath, hemoptysis, dysuria, hematuria. The patient denies any recent antibiotics. The patient follows Dr. Alvy Bimler for her myeloma, and he was started on Revlimid on 03/20/2014.n the emergency department, the patient was found to have a serum creatinine 2.93, lactic acid 3.50 with no significant pyuria and his urinalysis.  Assessment/Plan: Acute on chronic renal failure--CKD stage III -Secondary to volume depletion -Baseline creatinine 1.6-1.9 -Continue intravenous fluid hydration -Discontinue HCTZ  Lactic acidosis -secondary to volume depletion -continue empiric antibiotics pending culture data -d/c vanc and zosyn -start unasyn -already given lung findings on CT suspect possible aspiration -continue IVF -repeat lactate-->0.8  Intractable nausea and vomiting -This seems to have improved since admission -CT abdomen pelvis negative for acute intra-abdominal findings, that revealed bibasilar bronchial wall thickening with patchy peribronchial airspace disease-->suspect possible aspiration pneumonitis -respiratory panel given the patient's low-grade fever and unrelenting cough  Diarrhea -Likely secondary to the patient's Revlimid  Myeloma -Follows Dr. Alvy Bimler -Presently on Revlimid  Hypertension -Hold amlodipine and monitor clinically  Hyperlipidemia -Continue statin  Hypokalemia/hypomagnesemia -Replete  Family Communication: No family at beside Disposition Plan: Home  4/3 if stable       Procedures/Studies: Ct Abdomen Pelvis Wo Contrast  04/08/2015  CLINICAL DATA:  Nausea and vomiting for 7 days. Chills. Diarrhea. Mild lower abdominal pain. Fever. History of multiple myeloma; on chemotherapy. Elevated lipase. Chronic kidney disease. EXAM: CT ABDOMEN AND PELVIS WITHOUT CONTRAST TECHNIQUE: Multidetector CT imaging of the abdomen and pelvis was performed following the standard protocol without IV contrast. COMPARISON:  07/24/2011. FINDINGS: Lower chest: Bibasilar bronchial wall thickening. Patchy bibasilar peribronchovascular airspace disease. Concurrent mild Tree in bud opacities. Normal heart size without pericardial or pleural effusion. Small hiatal hernia. Hepatobiliary: Normal liver. Normal gallbladder, without biliary ductal dilatation. Pancreas: Normal, without mass or ductal dilatation. Spleen: Normal in size, without focal abnormality. Adrenals/Urinary Tract: Normal adrenal glands. Mild renal cortical thinning bilaterally. Anterior interpolar left renal cyst of 2.5 cm. No hydronephrosis. No hydroureter or ureteric calculi. No bladder calculi. Stomach/Bowel: Normal remainder of the stomach. Normal colon, appendix, and terminal ileum. Normal small bowel. Vascular/Lymphatic: Normal caliber of the aorta and branch vessels. No abdominopelvic adenopathy. Reproductive: Mild prostatomegaly with impression into the urinary bladder. Other: No significant free fluid. Musculoskeletal: Probable bone island in the left iliac, similar. Moderate convex left lumbar spine curvature with advanced spondylosis. Reversal of expected lumbar lordosis. IMPRESSION: 1. No acute process or explanation for abdominal pain/vomiting/nausea. 2. Bibasilar opacities, suspicious for infection (including atypical etiologies) or aspiration. 3. Small hiatal hernia. 4. Prostatomegaly. Electronically Signed   By: Abigail Miyamoto M.D.   On: 04/08/2015 02:02   Dg Chest 2 View  04/07/2015  CLINICAL DATA:   Nausea and vomiting, fever and chills for 1 week. History of multiple myeloma, hypertension. EXAM: CHEST  2 VIEW COMPARISON:  Chest radiograph February 16, 2014 FINDINGS: Cardiomediastinal silhouette is normal. The lungs are clear without pleural effusions or focal consolidations. Trachea projects midline and there is no pneumothorax. Soft tissue planes and included osseous structures are non-suspicious. Single-lumen  RIGHT chest Port-A-Cath distal tip projects in distal superior vena cava. IMPRESSION: No acute cardiopulmonary process. Electronically Signed   By: Elon Alas M.D.   On: 04/07/2015 23:55         Subjective: Overall he is feeling better. His vomiting has improved although he still feels nauseous. He is able to tolerate his diet. Denies any chest pain, shortness breath, fevers, chills, headache, neck pain, dysuria, hematuria. Abdominal pain has improved.  Objective: Filed Vitals:   04/08/15 1300 04/08/15 1925 04/08/15 2145 04/09/15 0552  BP: 129/62  113/54 104/59  Pulse: 64  58 59  Temp: 98.1 F (36.7 C)  98.6 F (37 C) 98.2 F (36.8 C)  TempSrc: Oral  Oral Oral  Resp: _0 Height:      Weight:      SpO2: 96% 98% 99% 98%    Intake/Output Summary (Last 24 hours) at 04/09/15 1246 Last data filed at 04/08/15 2245  Gross per 24 hour  Intake    480 ml  Output   1250 ml  Net   -770 ml   Weight change:  Exam:   General:  Pt is alert, follows commands appropriately, not in acute distress  HEENT: No icterus, No thrush, No neck mass, Teller/AT  Cardiovascular: RRR, S1/S2, no rubs, no gallops  Respiratory: bibasilar crackles. No wheezing. Good air movement.  Abdomen: Soft/+BS, non tender, non distended, no guarding; no hepatosplenomegaly  Extremities: No edema, No lymphangitis, No petechiae, No rashes, no synovitis  Data Reviewed: Basic Metabolic Panel:  Recent Labs Lab 04/07/15 2300 04/07/15 2319 04/08/15 0553 04/08/15 0808 04/09/15 0400  NA 139  137  --  138 137  K 3.2* 3.1*  --  3.1* 3.5  CL 100* 97*  --  109 109  CO2 22  --   --  20* 22  GLUCOSE 124* 119*  --  93 94  BUN 60* 53*  --  39* 25*  CREATININE 2.93* 3.00*  --  2.16* 1.91*  CALCIUM 8.2*  --   --  6.8* 7.3*  MG  --   --  1.3*  --  1.4*   Liver Function Tests:  Recent Labs Lab 04/07/15 2300  AST 43*  ALT 45  ALKPHOS 69  BILITOT 0.9  PROT 9.2*  ALBUMIN 4.0    Recent Labs Lab 04/07/15 2300  LIPASE 94*   No results for input(s): AMMONIA in the last 168 hours. CBC:  Recent Labs Lab 04/07/15 2300 04/07/15 2319  WBC 8.0  --   HGB 11.7* 13.9  HCT 34.0* 41.0  MCV 82.5  --   PLT 286  --    Cardiac Enzymes: No results for input(s): CKTOTAL, CKMB, CKMBINDEX, TROPONINI in the last 168 hours. BNP: Invalid input(s): POCBNP CBG: No results for input(s): GLUCAP in the last 168 hours.  Recent Results (from the past 240 hour(s))  Urine culture     Status: None   Collection Time: 04/08/15  1:29 AM  Result Value Ref Range Status   Specimen Description URINE, CLEAN CATCH  Final   Special Requests NONE  Final   Culture   Final    MULTIPLE SPECIES PRESENT, SUGGEST RECOLLECTION Performed at [facility]    Report Status 04/09/2015 FINAL  Final     Scheduled Meds: . aspirin EC  81 mg Oral Daily  . atorvastatin  20 mg Oral QHS  . brimonidine  1 drop Right Eye TID  . cholecalciferol  1,000 Units  Oral Daily  . dorzolamide-timolol  1 drop Both Eyes BID  . fluorometholone  1 drop Both Eyes Daily  . heparin  5,000 Units Subcutaneous 3 times per day  . latanoprost  1 drop Right Eye QHS  . magnesium sulfate 1 - 4 g bolus IVPB  4 g Intravenous Once  . piperacillin-tazobactam (ZOSYN)  IV  3.375 g Intravenous 3 times per day  . potassium chloride SA  20 mEq Oral Daily  . vancomycin  750 mg Intravenous Q24H   Continuous Infusions: . sodium chloride 0.9 % 1,000 mL with potassium chloride 20 mEq infusion 75 mL/hr at 04/09/15 0505     Itzel Lowrimore,  DO  [facility] Pager [phone]  If 7PM-7AM, please contact night-coverage [email or web address] Password [email or web address] 04/09/2015, 12:46 PM   LOS: 1 day

## 2015-04-10 LAB — CBC
HEMATOCRIT: 21.4 % — AB (ref 39.0–52.0)
HEMOGLOBIN: 7.4 g/dL — AB (ref 13.0–17.0)
MCH: 28.5 pg (ref 26.0–34.0)
MCHC: 34.6 g/dL (ref 30.0–36.0)
MCV: 82.3 fL (ref 78.0–100.0)
Platelets: 220 10*3/uL (ref 150–400)
RBC: 2.6 MIL/uL — AB (ref 4.22–5.81)
RDW: 16.7 % — AB (ref 11.5–15.5)
WBC: 5.9 10*3/uL (ref 4.0–10.5)

## 2015-04-10 LAB — RESPIRATORY VIRUS PANEL
Adenovirus: NEGATIVE
INFLUENZA A: NEGATIVE
INFLUENZA B 1: POSITIVE — AB
Metapneumovirus: NEGATIVE
Parainfluenza 1: NEGATIVE
Parainfluenza 2: NEGATIVE
Parainfluenza 3: NEGATIVE
RESPIRATORY SYNCYTIAL VIRUS A: NEGATIVE
RESPIRATORY SYNCYTIAL VIRUS B: NEGATIVE
Rhinovirus: NEGATIVE

## 2015-04-10 LAB — BASIC METABOLIC PANEL
ANION GAP: 4 — AB (ref 5–15)
BUN: 15 mg/dL (ref 6–20)
CHLORIDE: 108 mmol/L (ref 101–111)
CO2: 21 mmol/L — ABNORMAL LOW (ref 22–32)
Calcium: 7.5 mg/dL — ABNORMAL LOW (ref 8.9–10.3)
Creatinine, Ser: 1.71 mg/dL — ABNORMAL HIGH (ref 0.61–1.24)
GFR, EST AFRICAN AMERICAN: 44 mL/min — AB (ref >60)
GFR, EST NON AFRICAN AMERICAN: 38 mL/min — AB (ref >60)
Glucose, Bld: 84 mg/dL (ref 65–99)
POTASSIUM: 4.1 mmol/L (ref 3.5–5.1)
SODIUM: 133 mmol/L — AB (ref 135–145)

## 2015-04-10 LAB — PROCALCITONIN

## 2015-04-10 LAB — MAGNESIUM: Magnesium: 1.7 mg/dL (ref 1.7–2.4)

## 2015-04-10 MED ORDER — SODIUM CHLORIDE 0.9% FLUSH: 10.0000 mL | INTRAVENOUS | Status: DC | PRN

## 2015-04-10 MED ORDER — HEPARIN SOD (PORK) LOCK FLUSH 100 UNIT/ML IV SOLN
500.0000 [IU] | INTRAVENOUS | Status: DC | PRN
  Filled 2015-04-10: qty 5

## 2015-04-10 MED ORDER — AMOXICILLIN-POT CLAVULANATE 875-125 MG PO TABS: 1.0000 | ORAL_TABLET | Freq: Two times a day (BID) | ORAL | Status: DC

## 2015-04-10 NOTE — Discharge Summary (Signed)
Physician Discharge Summary  Calvin Mccormick AGT:[id number] DOB: 04/29/1945 DOA: 04/07/2015  PCP: Elyn Peers, MD  Admit date: 04/07/2015 Discharge date: 04/10/2015  Recommendations for Outpatient Follow-up:  1. Pt will need to follow up with PCP in 2 weeks post discharge 2. Please obtain BMP and CBC in 1-2 weeks  Discharge Diagnoses:  Acute on chronic renal failure--CKD stage III -Secondary to volume depletion -Baseline creatinine 1.6-1.9 -Continue intravenous fluid hydration-->improved -Discontinue HCTZ -serum creatinine 1.71 on the day of discharge  Lactic acidosis -secondary to volume depletion -continue empiric antibiotics pending culture data -d/c vanc and zosyn -start unasyn-->home with augmentin x 3 days to finish 5 days of tx  -already given lung findings on CT suspect possible aspiration -continued IVF -repeat lactate-->0.8 -the patient remained afebrile and hemodynamically stable without hypoxemia  Intractable nausea and vomiting -This seems to have improved since admission -CT abdomen pelvis negative for acute intra-abdominal findings, that revealed bibasilar bronchial wall thickening with patchy peribronchial airspace disease-->suspect possible aspiration pneumonitis -respiratory panel given the patient's low-grade fever and unrelenting cough--pending at the time of discharge -The patient's diet was advanced which he tolerated  Diarrhea -Likely secondary to the patient's Revlimid  Myeloma -Follows Dr. Alvy Bimler -Presently on Revlimid  Hypertension -Hold amlodipine and monitor clinically--will not restart as pt remained normotensive through the hospitalization -discontinue HCTZ and potassium supplementation  Hyperlipidemia -Continue statin  Hypokalemia/hypomagnesemia -Replete  Discharge Condition: stable  Disposition: home  Diet:heart healthy Wt Readings from Last 3 Encounters:  04/08/15 78.926 kg (174 lb)  02/16/15 78.926 kg (174 lb)  11/15/14 74.889  kg (165 lb 1.6 oz)    History of present illness:  70 year old male with a history of multiple myeloma, hypertension, CKD stage III, hyperlipidemia presented with one-week history of lower abdominal pain, nausea, vomiting, and coughing to the point of vomiting. The patient also complains of chronic "diarrhea"without any hematochezia or melena. He has had some subjective fevers and chills.he denies any chest discomfort, shortness of breath, hemoptysis, dysuria, hematuria. The patient denies any recent antibiotics. The patient follows Dr. Alvy Bimler for her myeloma, and he was started on Revlimid on 03/20/2014.n the emergency department, the patient was found to have a serum creatinine 2.93, lactic acid 3.50 with no significant pyuria and his urinalysis.  Discharge Exam: Filed Vitals:   04/10/15 0403 04/10/15 1252  BP: 101/54 119/67  Pulse: 57 67  Temp: 99 F (37.2 C) 98.9 F (37.2 C)  Resp: 16 18   Filed Vitals:   04/09/15 1446 04/09/15 2017 04/10/15 0403 04/10/15 1252  BP: 99/52 118/60 101/54 119/67  Pulse: 58 62 57 67  Temp: 98.4 F (36.9 C) 99.1 F (37.3 C) 99 F (37.2 C) 98.9 F (37.2 C)  TempSrc: Oral Oral Oral Oral  Resp: '18 18 16 18  ' Height:      Weight:      SpO2: 100% 100% 99% 100%   General: A&O x 3, NAD, pleasant, cooperative Cardiovascular: RRR, no rub, no gallop, no S3 Respiratory: bibasilar crackles. No wheezes Abdomen:soft, nontender, nondistended, positive bowel sounds Extremities: No edema, No lymphangitis, no petechiae  Discharge Instructions  Discharge Instructions    Diet - low sodium heart healthy    Complete by:  As directed      Increase activity slowly    Complete by:  As directed             Medication List    STOP taking these medications        amLODipine 10 MG  tablet  Commonly known as:  NORVASC     hydrochlorothiazide 25 MG tablet  Commonly known as:  HYDRODIURIL     KLOR-CON M20 20 MEQ tablet  Generic drug:  potassium chloride SA       ondansetron 8 MG tablet  Commonly known as:  ZOFRAN     oxyCODONE-acetaminophen 5-325 MG tablet  Commonly known as:  PERCOCET      TAKE these medications        allopurinol 100 MG tablet  Commonly known as:  ZYLOPRIM  Take 100 mg by mouth 2 (two) times daily.     amoxicillin-clavulanate 875-125 MG tablet  Commonly known as:  AUGMENTIN  Take 1 tablet by mouth every 12 (twelve) hours.     aspirin EC 81 MG tablet  Take 81 mg by mouth daily.     atorvastatin 20 MG tablet  Commonly known as:  LIPITOR  Take 20 mg by mouth at bedtime.     brimonidine 0.15 % ophthalmic solution  Commonly known as:  ALPHAGAN  Place 1 drop into the right eye 3 (three) times daily.     cholecalciferol 1000 units tablet  Commonly known as:  VITAMIN D  Take 1,000 Units by mouth daily.     dorzolamide-timolol 22.3-6.8 MG/ML ophthalmic solution  Commonly known as:  COSOPT  Place 1 drop into both eyes 2 (two) times daily.     fluorometholone 0.1 % ophthalmic suspension  Commonly known as:  FML  Place 1 drop into both eyes daily.     latanoprost 0.005 % ophthalmic solution  Commonly known as:  XALATAN  Place 1 drop into the right eye at bedtime.     lenalidomide 5 MG capsule  Commonly known as:  REVLIMID  Take 1 capsule (5 mg total) by mouth daily. Take 1 capsule daily for 21 days and then rest 7 days     lidocaine-prilocaine cream  Commonly known as:  EMLA  Apply 1 application topically as needed.     loperamide 2 MG tablet  Commonly known as:  IMODIUM A-D  1 po after each watery BM.  Maximum 6 per 24hrs.         The results of significant diagnostics from this hospitalization (including imaging, microbiology, ancillary and laboratory) are listed below for reference.    Significant Diagnostic Studies: Ct Abdomen Pelvis Wo Contrast  04/08/2015  CLINICAL DATA:  Nausea and vomiting for 7 days. Chills. Diarrhea. Mild lower abdominal pain. Fever. History of multiple myeloma; on  chemotherapy. Elevated lipase. Chronic kidney disease. EXAM: CT ABDOMEN AND PELVIS WITHOUT CONTRAST TECHNIQUE: Multidetector CT imaging of the abdomen and pelvis was performed following the standard protocol without IV contrast. COMPARISON:  07/24/2011. FINDINGS: Lower chest: Bibasilar bronchial wall thickening. Patchy bibasilar peribronchovascular airspace disease. Concurrent mild Tree in bud opacities. Normal heart size without pericardial or pleural effusion. Small hiatal hernia. Hepatobiliary: Normal liver. Normal gallbladder, without biliary ductal dilatation. Pancreas: Normal, without mass or ductal dilatation. Spleen: Normal in size, without focal abnormality. Adrenals/Urinary Tract: Normal adrenal glands. Mild renal cortical thinning bilaterally. Anterior interpolar left renal cyst of 2.5 cm. No hydronephrosis. No hydroureter or ureteric calculi. No bladder calculi. Stomach/Bowel: Normal remainder of the stomach. Normal colon, appendix, and terminal ileum. Normal small bowel. Vascular/Lymphatic: Normal caliber of the aorta and branch vessels. No abdominopelvic adenopathy. Reproductive: Mild prostatomegaly with impression into the urinary bladder. Other: No significant free fluid. Musculoskeletal: Probable bone island in the left iliac, similar. Moderate convex left lumbar  spine curvature with advanced spondylosis. Reversal of expected lumbar lordosis. IMPRESSION: 1. No acute process or explanation for abdominal pain/vomiting/nausea. 2. Bibasilar opacities, suspicious for infection (including atypical etiologies) or aspiration. 3. Small hiatal hernia. 4. Prostatomegaly. Electronically Signed   By: Abigail Miyamoto M.D.   On: 04/08/2015 02:02   Dg Chest 2 View  04/07/2015  CLINICAL DATA:  Nausea and vomiting, fever and chills for 1 week. History of multiple myeloma, hypertension. EXAM: CHEST  2 VIEW COMPARISON:  Chest radiograph February 16, 2014 FINDINGS: Cardiomediastinal silhouette is normal. The lungs are  clear without pleural effusions or focal consolidations. Trachea projects midline and there is no pneumothorax. Soft tissue planes and included osseous structures are non-suspicious. Single-lumen RIGHT chest Port-A-Cath distal tip projects in distal superior vena cava. IMPRESSION: No acute cardiopulmonary process. Electronically Signed   By: Elon Alas M.D.   On: 04/07/2015 23:55     Microbiology: Recent Results (from the past 240 hour(s))  Blood Culture (routine x 2)     Status: None (Preliminary result)   Collection Time: 04/07/15 11:05 PM  Result Value Ref Range Status   Specimen Description BLOOD RIGHT ANTECUBITAL  Final   Special Requests BOTTLES DRAWN AEROBIC AND ANAEROBIC 5CC  Final   Culture   Final    NO GROWTH 2 DAYS Performed at [facility]    Report Status PENDING  Incomplete  Blood Culture (routine x 2)     Status: None (Preliminary result)   Collection Time: 04/08/15 12:10 AM  Result Value Ref Range Status   Specimen Description BLOOD LEFT FOREARM  Final   Special Requests BOTTLES DRAWN AEROBIC AND ANAEROBIC 5CC  Final   Culture   Final    NO GROWTH 2 DAYS Performed at [facility]    Report Status PENDING  Incomplete  Urine culture     Status: None   Collection Time: 04/08/15  1:29 AM  Result Value Ref Range Status   Specimen Description URINE, CLEAN CATCH  Final   Special Requests NONE  Final   Culture   Final    MULTIPLE SPECIES PRESENT, SUGGEST RECOLLECTION Performed at [facility]    Report Status 04/09/2015 FINAL  Final     Labs: Basic Metabolic Panel:  Recent Labs Lab 04/07/15 2300 04/07/15 2319 04/08/15 0553 04/08/15 0808 04/09/15 0400 04/10/15 0405  NA 139 137  --  138 137 133*  K 3.2* 3.1*  --  3.1* 3.5 4.1  CL 100* 97*  --  109 109 108  CO2 22  --   --  20* 22 21*  GLUCOSE 124* 119*  --  93 94 84  BUN 60* 53*  --  39* 25* 15  CREATININE 2.93* 3.00*  --  2.16* 1.91* 1.71*  CALCIUM 8.2*  --   --  6.8* 7.3*  7.5*  MG  --   --  1.3*  --  1.4* 1.7   Liver Function Tests:  Recent Labs Lab 04/07/15 2300  AST 43*  ALT 45  ALKPHOS 69  BILITOT 0.9  PROT 9.2*  ALBUMIN 4.0    Recent Labs Lab 04/07/15 2300  LIPASE 94*   No results for input(s): AMMONIA in the last 168 hours. CBC:  Recent Labs Lab 04/07/15 2300 04/07/15 2319 04/10/15 0405  WBC 8.0  --  5.9  HGB 11.7* 13.9 7.4*  HCT 34.0* 41.0 21.4*  MCV 82.5  --  82.3  PLT 286  --  220   Cardiac  Enzymes: No results for input(s): CKTOTAL, CKMB, CKMBINDEX, TROPONINI in the last 168 hours. BNP: Invalid input(s): POCBNP CBG: No results for input(s): GLUCAP in the last 168 hours.  Time coordinating discharge:  Greater than 30 minutes  Signed:  Cottrell Gentles, DO [facility] Pager: [phone] 04/10/2015, 8:34 PM

## 2015-04-10 NOTE — Progress Notes (Signed)
[facility] is providing the following services: [employer] (shipping to the patient's home)  If patient discharges after hours, please call [phone].   Linward Headland 04/10/2015, 2:08 PM

## 2015-04-10 NOTE — Evaluation (Signed)
Physical Therapy Evaluation Patient Details Name: Calvin Mccormick MRN: [id number] DOB: 02/03/1945 Today's Date: 04/10/2015   History of Present Illness  70 year old male with a history of multiple myeloma, hypertension, CKD stage III, hyperlipidemia presented with one-week history of lower abdominal pain, nausea, vomiting, and coughing to the point of vomiting, admitted 3/31  Clinical Impression  Pt admitted with above diagnosis. Pt currently with functional limitations due to the deficits listed below (see PT Problem List).  Pt will benefit from skilled PT to increase their independence and safety with mobility to allow discharge to the venue listed below.   Pt will benefit form continued PT in acute and HHPT at D/C to allow return to prior level of function/independence     Follow Up Recommendations Home health PT;Supervision - Intermittent    Equipment Recommendations  None recommended by PT    Recommendations for Other Services       Precautions / Restrictions Precautions Precautions: Fall Restrictions Other Position/Activity Restrictions: --      Mobility  Bed Mobility Overal bed mobility: Modified Independent             General bed mobility comments: incr time required  Transfers Overall transfer level: Needs assistance Equipment used: Rolling walker (2 wheeled) Transfers: Sit to/from Stand Sit to Stand: Min guard         General transfer comment: for safety, good control of descent with cues  Ambulation/Gait Ambulation/Gait assistance: Min guard Ambulation Distance (Feet): 50 Feet Assistive device: Rolling walker (2 wheeled) Gait Pattern/deviations: Step-through pattern     General Gait Details: slow speed, steady wtih RW, no LOB, occaional cues for RW safety  Stairs            Wheelchair Mobility    Modified Rankin (Stroke Patients Only)       Balance Overall balance assessment: Needs assistance   Sitting balance-Leahy Scale: Good        Standing balance-Leahy Scale: Fair                               Pertinent Vitals/Pain Pain Assessment: No/denies pain    Home Living Family/patient expects to be discharged to:: Private residence Living Arrangements: Spouse/significant other Available Help at Discharge: Family;Available 24 hours/day Type of Home: House Home Access: Ramped entrance     Home Layout: One level Home Equipment: Walker - 2 wheels;Cane - single point      Prior Function           Comments: amb with cane most of time; pt reports ADLS require incr time     Hand Dominance        Extremity/Trunk Assessment   Upper Extremity Assessment: Overall WFL for tasks assessed           Lower Extremity Assessment: Overall WFL for tasks assessed         Communication   Communication: No difficulties  Cognition Arousal/Alertness: Awake/alert Behavior During Therapy: WFL for tasks assessed/performed Overall Cognitive Status: Within Functional Limits for tasks assessed                      General Comments      Exercises        Assessment/Plan    PT Assessment Patient needs continued PT services  PT Diagnosis Difficulty walking   PT Problem List Decreased mobility;Decreased activity tolerance  PT Treatment Interventions DME instruction;Gait training;Functional mobility training;Therapeutic activities;Therapeutic exercise;Patient/family  education   PT Goals (Current goals can be found in the Care Plan section) Acute Rehab PT Goals Patient Stated Goal: home soon PT Goal Formulation: With patient Time For Goal Achievement: 04/17/15 Potential to Achieve Goals: Good    Frequency Min 3X/week   Barriers to discharge        Co-evaluation               End of Session Equipment Utilized During Treatment: Gait belt Activity Tolerance: Patient tolerated treatment well Patient left: in chair;with call bell/phone within reach (no chair alarm  available) Nurse Communication: Mobility status         Time: 1583-0940 PT Time Calculation (min) (ACUTE ONLY): 18 min   Charges:   PT Evaluation $PT Eval Low Complexity: 1 Procedure     PT G Codes:        Calvin Mccormick 04-28-2015, 11:10 AM

## 2015-04-10 NOTE — Progress Notes (Signed)
Discharge instructions explained to pt and family. Prescriptions called to [facility]. Discharged via wheelchair.

## 2015-04-10 NOTE — Care Management Note (Signed)
Case Management Note  Patient Details  Name: Calvin Mccormick MRN: [id number] Date of Birth: 1945/05/08  Subjective/Objective:           70 yo admitted with Acute Renal Failure         Action/Plan: From home with spouse.  Expected Discharge Date:                  Expected Discharge Plan:  Home/Self Care  In-House Referral:     Discharge planning Services  CM Consult  Post Acute Care Choice:  Home Health Choice offered to:  Patient  DME Arranged:  3-N-1 DME Agency:  [place]:    [place]:     Status of Service:  Completed, signed off  Medicare Important Message Given:    Date Medicare IM Given:    Medicare IM give by:    Date Additional Medicare IM Given:    Additional Medicare Important Message give by:     If discussed at [place] of Stay Meetings, dates discussed:    Additional Comments: This CM met with pt and spouse at bedside to discuss DC planning.  HHPT was recommended by PT.  This CM contacted several [place] agencies in [place] about [facility] for PT.  Only one company could provide HHPT and they are out of network with pt's insurance, which would mean pt would have a copay per visit.  Pt states he does not want to pay a copay and will do exercises at home on his own.  3 in 1 to be delivered to home by [facility].  [place] DME rep contacted for referral.  No other CM needs communicated. Calvin Mccormick, SJOGREN, RN 04/10/2015, 2:05 PM

## 2015-04-13 LAB — CULTURE, BLOOD (ROUTINE X 2)
CULTURE: NO GROWTH
Culture: NO GROWTH

## 2015-04-26 ENCOUNTER — Other Ambulatory Visit: Payer: Self-pay | Admitting: *Deleted

## 2015-04-26 MED ORDER — LENALIDOMIDE 5 MG PO CAPS: 5.0000 mg | ORAL_CAPSULE | Freq: Every day | ORAL | Status: DC

## 2015-04-29 ENCOUNTER — Other Ambulatory Visit: Payer: Self-pay | Admitting: Hematology and Oncology

## 2015-05-03 ENCOUNTER — Telehealth: Payer: Self-pay | Admitting: *Deleted

## 2015-05-03 NOTE — Telephone Encounter (Signed)
Does anyone know about this form for Mr Calvin Mccormick?

## 2015-05-03 NOTE — Telephone Encounter (Signed)
No call

## 2015-05-03 NOTE — Telephone Encounter (Signed)
"  Kaylum [place]oven calling to see if Dr. Alvy Bimler has received fax from the Patient Calvin Mccormick.  They provide help for him every year and should have faxed renewal request last week.."  Call transferred to [place] ext [phone] at [phone].

## 2015-05-03 NOTE — Telephone Encounter (Signed)
I have signed all the paperwork on my table. Please check with Raquel or Darlena or [employer]

## 2015-05-09 ENCOUNTER — Telehealth: Payer: Self-pay | Admitting: *Deleted

## 2015-05-09 NOTE — Telephone Encounter (Signed)
Faxed completed paperwork to the [facility]

## 2015-05-16 ENCOUNTER — Ambulatory Visit (HOSPITAL_BASED_OUTPATIENT_CLINIC_OR_DEPARTMENT_OTHER): Payer: Medicare PPO

## 2015-05-16 ENCOUNTER — Other Ambulatory Visit (HOSPITAL_BASED_OUTPATIENT_CLINIC_OR_DEPARTMENT_OTHER): Payer: Medicare PPO

## 2015-05-16 DIAGNOSIS — Z95828 Presence of other vascular implants and grafts: Secondary | ICD-10-CM

## 2015-05-16 DIAGNOSIS — C9001 Multiple myeloma in remission: Secondary | ICD-10-CM

## 2015-05-16 LAB — COMPREHENSIVE METABOLIC PANEL
ALT: 12 U/L (ref 0–55)
ANION GAP: 6 meq/L (ref 3–11)
AST: 15 U/L (ref 5–34)
Albumin: 3.4 g/dL — ABNORMAL LOW (ref 3.5–5.0)
Alkaline Phosphatase: 57 U/L (ref 40–150)
BILIRUBIN TOTAL: 0.54 mg/dL (ref 0.20–1.20)
BUN: 15.7 mg/dL (ref 7.0–26.0)
CHLORIDE: 107 meq/L (ref 98–109)
CO2: 25 meq/L (ref 22–29)
CREATININE: 1.8 mg/dL — AB (ref 0.7–1.3)
Calcium: 9.4 mg/dL (ref 8.4–10.4)
EGFR: 43 mL/min/{1.73_m2} — ABNORMAL LOW (ref >90)
Glucose: 78 mg/dl (ref 70–140)
Potassium: 4.3 mEq/L (ref 3.5–5.1)
SODIUM: 138 meq/L (ref 136–145)
TOTAL PROTEIN: 7 g/dL (ref 6.4–8.3)

## 2015-05-16 LAB — CBC WITH DIFFERENTIAL/PLATELET
BASO%: 1.9 % (ref 0.0–2.0)
Basophils Absolute: 0.1 10*3/uL (ref 0.0–0.1)
EOS%: 5.8 % (ref 0.0–7.0)
Eosinophils Absolute: 0.2 10*3/uL (ref 0.0–0.5)
HCT: 26.9 % — ABNORMAL LOW (ref 38.4–49.9)
HGB: 8.8 g/dL — ABNORMAL LOW (ref 13.0–17.1)
LYMPH%: 45.5 % (ref 14.0–49.0)
MCH: 28.9 pg (ref 27.2–33.4)
MCHC: 32.7 g/dL (ref 32.0–36.0)
MCV: 88.2 fL (ref 79.3–98.0)
MONO#: 0.3 10*3/uL (ref 0.1–0.9)
MONO%: 6.5 % (ref 0.0–14.0)
NEUT%: 40.3 % (ref 39.0–75.0)
NEUTROS ABS: 1.7 10*3/uL (ref 1.5–6.5)
NRBC: 0 % (ref 0–0)
Platelets: 234 10*3/uL (ref 140–400)
RBC: 3.05 10*6/uL — AB (ref 4.20–5.82)
RDW: 18.9 % — AB (ref 11.0–14.6)
WBC: 4.2 10*3/uL (ref 4.0–10.3)
lymph#: 1.9 10*3/uL (ref 0.9–3.3)

## 2015-05-16 MED ORDER — HEPARIN SOD (PORK) LOCK FLUSH 100 UNIT/ML IV SOLN
500.0000 [IU] | Freq: Once | INTRAVENOUS | Status: AC
  Administered 2015-05-16: 500 [IU] via INTRAVENOUS
  Filled 2015-05-16: qty 5

## 2015-05-16 MED ORDER — SODIUM CHLORIDE 0.9% FLUSH
10.0000 mL | INTRAVENOUS | Status: DC | PRN
  Administered 2015-05-16: 10 mL via INTRAVENOUS
  Filled 2015-05-16: qty 10

## 2015-05-17 LAB — KAPPA/LAMBDA LIGHT CHAINS
IG KAPPA FREE LIGHT CHAIN: 171.74 mg/L — AB (ref 3.30–19.40)
Ig Lambda Free Light Chain: 23.46 mg/L (ref 5.71–26.30)
KAPPA/LAMBDA FLC RATIO: 7.32 — AB (ref 0.26–1.65)

## 2015-05-18 LAB — MULTIPLE MYELOMA PANEL, SERUM
ALBUMIN/GLOB SERPL: 1.1 (ref 0.7–1.7)
ALPHA 1: 0.2 g/dL (ref 0.0–0.4)
ALPHA2 GLOB SERPL ELPH-MCNC: 0.7 g/dL (ref 0.4–1.0)
Albumin SerPl Elph-Mcnc: 3.3 g/dL (ref 2.9–4.4)
B-GLOBULIN SERPL ELPH-MCNC: 1 g/dL (ref 0.7–1.3)
Gamma Glob SerPl Elph-Mcnc: 1.4 g/dL (ref 0.4–1.8)
Globulin, Total: 3.3 g/dL (ref 2.2–3.9)
IGM (IMMUNOGLOBIN M), SRM: 28 mg/dL (ref 15–143)
IgA, Qn, Serum: 123 mg/dL (ref 61–437)
M PROTEIN SERPL ELPH-MCNC: 0.9 g/dL — AB
TOTAL PROTEIN: 6.6 g/dL (ref 6.0–8.5)

## 2015-05-23 ENCOUNTER — Encounter: Payer: Self-pay | Admitting: Hematology and Oncology

## 2015-05-23 ENCOUNTER — Ambulatory Visit (HOSPITAL_BASED_OUTPATIENT_CLINIC_OR_DEPARTMENT_OTHER): Payer: Medicare PPO | Admitting: Hematology and Oncology

## 2015-05-23 ENCOUNTER — Telehealth: Payer: Self-pay | Admitting: Hematology and Oncology

## 2015-05-23 ENCOUNTER — Telehealth: Payer: Self-pay | Admitting: *Deleted

## 2015-05-23 VITALS — BP 128/61 | HR 55 | Temp 98.2°F | Resp 18 | Ht 71.0 in | Wt 168.0 lb

## 2015-05-23 DIAGNOSIS — N189 Chronic kidney disease, unspecified: Secondary | ICD-10-CM

## 2015-05-23 DIAGNOSIS — N183 Chronic kidney disease, stage 3 unspecified: Secondary | ICD-10-CM

## 2015-05-23 DIAGNOSIS — C9001 Multiple myeloma in remission: Secondary | ICD-10-CM | POA: Diagnosis not present

## 2015-05-23 DIAGNOSIS — D631 Anemia in chronic kidney disease: Secondary | ICD-10-CM | POA: Diagnosis not present

## 2015-05-23 NOTE — Progress Notes (Signed)
[place] OFFICE PROGRESS NOTE  Patient Care Team: Lucianne Lei, MD as PCP - General (Family Medicine) Heath Lark, MD as Consulting Physician (Hematology and Oncology)  SUMMARY OF ONCOLOGIC HISTORY:   Multiple myeloma in remission ([place])   12/12/2010 Initial Diagnosis Multiple myeloma   02/16/2014 Imaging Skeletal survey showed diffuse osteopenia   03/02/2014 Bone Marrow Biopsy Accession: [id number] biopsy showed only 5 % plasma cells. However, the biopsy was difficult and the bone was fragmented during the procedure. Cytogenetics and FISH study is normal   03/03/2014 Procedure he has port placement   03/10/2014 - 05/19/2014 Chemotherapy he received elotuzumab and revlimid   05/20/2014 -  Chemotherapy Treatment is switched to maintenance Revlimid only   11/15/2014 Adverse Reaction He has mild worsening anemia. Does of Revlimid reduced to 5 mg 21 days on, 7 days off    INTERVAL HISTORY: Please see below for problem oriented charting. He returns for further follow-up. He had recent admission to the hospital for severe nausea, vomiting, dehydration and acute on chronic renal failure. Since then, his symptom has resolved. Denies dental issue. No recent infection.  REVIEW OF SYSTEMS:   Constitutional: Denies fevers, chills or abnormal weight loss Eyes: Denies blurriness of vision Ears, nose, mouth, throat, and face: Denies mucositis or sore throat Respiratory: Denies cough, dyspnea or wheezes Cardiovascular: Denies palpitation, chest discomfort or lower extremity swelling Gastrointestinal:  Denies nausea, heartburn or change in bowel habits Skin: Denies abnormal skin rashes Lymphatics: Denies new lymphadenopathy or easy bruising Neurological:Denies numbness, tingling or new weaknesses Behavioral/Psych: Mood is stable, no new changes  All other systems were reviewed with the patient and are negative.  I have reviewed the past medical history, past surgical history, social  history and family history with the patient and they are unchanged from previous note.  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  MEDICATIONS:  Current Outpatient Prescriptions  Medication Sig Dispense Refill  . allopurinol (ZYLOPRIM) 100 MG tablet Take 100 mg by mouth 2 (two) times daily.     Marland Kitchen amoxicillin-clavulanate (AUGMENTIN) 875-125 MG tablet Take 1 tablet by mouth every 12 (twelve) hours. 6 tablet 0  . aspirin EC 81 MG tablet Take 81 mg by mouth daily.    Marland Kitchen atorvastatin (LIPITOR) 20 MG tablet Take 20 mg by mouth at bedtime.     . brimonidine (ALPHAGAN) 0.15 % ophthalmic solution Place 1 drop into the right eye 3 (three) times daily.    . cholecalciferol (VITAMIN D) 1000 UNITS tablet Take 1,000 Units by mouth daily.    . dorzolamide-timolol (COSOPT) 22.3-6.8 MG/ML ophthalmic solution Place 1 drop into both eyes 2 (two) times daily.     . fluorometholone (FML) 0.1 % ophthalmic suspension Place 1 drop into both eyes daily.    Marland Kitchen latanoprost (XALATAN) 0.005 % ophthalmic solution Place 1 drop into the right eye at bedtime.    Marland Kitchen lenalidomide (REVLIMID) 5 MG capsule Take 1 capsule (5 mg total) by mouth daily. Take 1 capsule daily for 21 days and then rest 7 days 21 capsule 0  . lidocaine-prilocaine (EMLA) cream Apply 1 application topically as needed. 30 g 1  . loperamide (IMODIUM A-D) 2 MG tablet 1 po after each watery BM.  Maximum 6 per 24hrs. 30 tablet PRN   No current facility-administered medications for this visit.    PHYSICAL EXAMINATION: ECOG PERFORMANCE STATUS: 1 - Symptomatic but completely ambulatory  Filed Vitals:   05/23/15 1144  BP: 128/61  Pulse: 55  Temp: 98.2 F (36.8 C)  Resp: 18   Filed Weights   05/23/15 1144  Weight: 168 lb (76.204 kg)    GENERAL:alert, no distress and comfortable SKIN: skin color, texture, turgor are normal, no rashes or significant lesions EYES: normal, Conjunctiva are pink and non-injected, sclera clear OROPHARYNX:no  exudate, no erythema and lips, buccal mucosa, and tongue normal  NECK: supple, thyroid normal size, non-tender, without nodularity LYMPH:  no palpable lymphadenopathy in the cervical, axillary or inguinal LUNGS: clear to auscultation and percussion with normal breathing effort HEART: regular rate & rhythm and no murmurs and no lower extremity edema ABDOMEN:abdomen soft, non-tender and normal bowel sounds Musculoskeletal:no cyanosis of digits and no clubbing  NEURO: alert & oriented x 3 with fluent speech, no focal motor/sensory deficits  LABORATORY DATA:  I have reviewed the data as listed    Component Value Date/Time   NA 138 05/16/2015 1046   NA 133* 04/10/2015 0405   K 4.3 05/16/2015 1046   K 4.1 04/10/2015 0405   CL 108 04/10/2015 0405   CL 107 06/29/2012 1131   CO2 25 05/16/2015 1046   CO2 21* 04/10/2015 0405   GLUCOSE 78 05/16/2015 1046   GLUCOSE 84 04/10/2015 0405   GLUCOSE 143* 06/29/2012 1131   BUN 15.7 05/16/2015 1046   BUN 15 04/10/2015 0405   CREATININE 1.8* 05/16/2015 1046   CREATININE 1.71* 04/10/2015 0405   CREATININE 2.03* 12/14/2010 1114   CALCIUM 9.4 05/16/2015 1046   CALCIUM 7.5* 04/10/2015 0405   PROT 7.0 05/16/2015 1046   PROT 6.6 05/16/2015 1046   PROT 9.2* 04/07/2015 2300   ALBUMIN 3.4* 05/16/2015 1046   ALBUMIN 4.0 04/07/2015 2300   AST 15 05/16/2015 1046   AST 43* 04/07/2015 2300   ALT 12 05/16/2015 1046   ALT 45 04/07/2015 2300   ALKPHOS 57 05/16/2015 1046   ALKPHOS 69 04/07/2015 2300   BILITOT 0.54 05/16/2015 1046   BILITOT 0.9 04/07/2015 2300   GFRNONAA 38* 04/10/2015 0405   GFRAA 44* 04/10/2015 0405    No results found for: SPEP, UPEP  Lab Results  Component Value Date   WBC 4.2 05/16/2015   NEUTROABS 1.7 05/16/2015   HGB 8.8* 05/16/2015   HCT 26.9* 05/16/2015   MCV 88.2 05/16/2015   PLT 234 05/16/2015      Chemistry      Component Value Date/Time   NA 138 05/16/2015 1046   NA 133* 04/10/2015 0405   K 4.3 05/16/2015 1046    K 4.1 04/10/2015 0405   CL 108 04/10/2015 0405   CL 107 06/29/2012 1131   CO2 25 05/16/2015 1046   CO2 21* 04/10/2015 0405   BUN 15.7 05/16/2015 1046   BUN 15 04/10/2015 0405   CREATININE 1.8* 05/16/2015 1046   CREATININE 1.71* 04/10/2015 0405   CREATININE 2.03* 12/14/2010 1114      Component Value Date/Time   CALCIUM 9.4 05/16/2015 1046   CALCIUM 7.5* 04/10/2015 0405   ALKPHOS 57 05/16/2015 1046   ALKPHOS 69 04/07/2015 2300   AST 15 05/16/2015 1046   AST 43* 04/07/2015 2300   ALT 12 05/16/2015 1046   ALT 45 04/07/2015 2300   BILITOT 0.54 05/16/2015 1046   BILITOT 0.9 04/07/2015 2300      ASSESSMENT & PLAN:  Multiple myeloma in remission ([place]) His recent blood work shows stable disease. We will continue Revlimid for now and Zometa at reduced dose every 6 months. He will continue calcium, vitamin D and aspirin. He  denies recent dental problems  Anemia in chronic renal disease  His anemia is stable He is not symptomatic. Recommend observation only.  Chronic renal insufficiency, stage III (moderate) This is chronic in nature. It is stable. Recommend observation only. We'll give him a reduced dose Zometa twice a year due to chronic renal failure His next dose is due in August 2017      Orders Placed This Encounter  Procedures  . CBC with Differential/Platelet    Standing Status: Future     Number of Occurrences:      Standing Expiration Date: 06/26/2016  . Comprehensive metabolic panel    Standing Status: Future     Number of Occurrences:      Standing Expiration Date: 06/26/2016  . Kappa/lambda light chains    Standing Status: Future     Number of Occurrences:      Standing Expiration Date: 06/26/2016  . Multiple Myeloma Panel (SPEP&IFE w/QIG)    Standing Status: Future     Number of Occurrences:      Standing Expiration Date: 06/26/2016   All questions were answered. The patient knows to call the clinic with any problems, questions or concerns. No barriers to  learning was detected. I spent 15 minutes counseling the patient face to face. The total time spent in the appointment was 20 minutes and more than 50% was on counseling and review of test results     [facility] [place], Tayleigh Wetherell, MD 05/23/2015 1:14 PM

## 2015-05-23 NOTE — Telephone Encounter (Signed)
Per staff message and POF I have scheduled appts. Advised scheduler of appts. JMW  

## 2015-05-23 NOTE — Assessment & Plan Note (Signed)
His anemia is stable He is not symptomatic. Recommend observation only.

## 2015-05-23 NOTE — Assessment & Plan Note (Signed)
This is chronic in nature. It is stable. Recommend observation only. We'll give him a reduced dose Zometa twice a year due to chronic renal failure His next dose is due in August 2017

## 2015-05-23 NOTE — Assessment & Plan Note (Signed)
His recent blood work shows stable disease. We will continue Revlimid for now and Zometa at reduced dose every 6 months. He will continue calcium, vitamin D and aspirin. He denies recent dental problems

## 2015-05-23 NOTE — Telephone Encounter (Signed)
per pof to sch pt appt-gave pt copy of avs °

## 2015-05-25 ENCOUNTER — Other Ambulatory Visit: Payer: Self-pay | Admitting: *Deleted

## 2015-05-25 MED ORDER — LENALIDOMIDE 5 MG PO CAPS: 5.0000 mg | ORAL_CAPSULE | Freq: Every day | ORAL | Status: DC

## 2015-05-29 ENCOUNTER — Telehealth: Payer: Self-pay | Admitting: *Deleted

## 2015-05-29 NOTE — Telephone Encounter (Signed)
Fax from [facility] states they have been trying to reach pt to arrange delivery of his Revlimid.  I called pt and gave him the phone number to call [facility] to arrange delivery.  He said he would call them now.

## 2015-06-27 ENCOUNTER — Other Ambulatory Visit: Payer: Self-pay | Admitting: *Deleted

## 2015-06-27 MED ORDER — LENALIDOMIDE 5 MG PO CAPS: 5.0000 mg | ORAL_CAPSULE | Freq: Every day | ORAL | Status: DC

## 2015-07-25 ENCOUNTER — Other Ambulatory Visit: Payer: Self-pay | Admitting: *Deleted

## 2015-07-25 MED ORDER — LENALIDOMIDE 5 MG PO CAPS: 5.0000 mg | ORAL_CAPSULE | Freq: Every day | ORAL | Status: DC

## 2015-07-31 ENCOUNTER — Encounter (HOSPITAL_COMMUNITY): Payer: Self-pay | Admitting: Dentistry

## 2015-08-07 ENCOUNTER — Encounter (HOSPITAL_COMMUNITY): Payer: Self-pay | Admitting: Dentistry

## 2015-08-07 ENCOUNTER — Ambulatory Visit (HOSPITAL_COMMUNITY): Payer: Self-pay | Admitting: Dentistry

## 2015-08-07 VITALS — BP 137/58 | HR 51 | Temp 98.4°F

## 2015-08-07 DIAGNOSIS — C9001 Multiple myeloma in remission: Secondary | ICD-10-CM | POA: Diagnosis not present

## 2015-08-07 DIAGNOSIS — K0820 Unspecified atrophy of edentulous alveolar ridge: Secondary | ICD-10-CM

## 2015-08-07 DIAGNOSIS — C9000 Multiple myeloma not having achieved remission: Secondary | ICD-10-CM

## 2015-08-07 DIAGNOSIS — Z972 Presence of dental prosthetic device (complete) (partial): Secondary | ICD-10-CM

## 2015-08-07 DIAGNOSIS — Z463 Encounter for fitting and adjustment of dental prosthetic device: Secondary | ICD-10-CM

## 2015-08-07 DIAGNOSIS — K08109 Complete loss of teeth, unspecified cause, unspecified class: Secondary | ICD-10-CM

## 2015-08-07 NOTE — Progress Notes (Signed)
08/07/2015  Patient Name:   Calvin Mccormick Date of Birth:   [birth year] Medical Record Number: [id number]  BP (!) 137/58   Pulse (!) 51   Temp 98.4 F (36.9 C) (Oral)   Calvin Mccormick is a 70 year old male that presents for periodic oral exam and evaluation of upper and lower complete dentures. Patient had extraction of remaining teeth with alveoloplasty on 05/27/2014 as part of a pre-Zometa therapy dental protocol. Upper and lower complete dentures were then fabricated and inserted on 09/01/2014. Patient has been seen for multiple denture adjustment appointment since then. Patient has multiple myeloma that is in remission. Patient now having Zometa therapy twice a year with Dr. Alvy Bimler. Patient now presents for a periodic oral examination and evaluation of upper and lower complete dentures.  Medical Hx Update:  Past Medical History:  Diagnosis Date  . Anemia 11/27/2010  . Cancer ([place])   . Chronic renal insufficiency, stage III (moderate) 06/17/2011   Due to myeloma & HTN  . CKD (chronic kidney disease), stage III 02/05/2011  . 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 Diarrhea 06/17/2011   Hospital admission 05/30/11 velcade toxicity? Infectious?  Marland Kitchen Hyperlipemia   . Hypertension, benign essential, goal below 140/90 11/27/2010  . Hypokalemia with normal acid-base balance 07/29/2011  . Hypomagnesemia 07/29/2011  . Seasonal allergies   .  Past Surgical History:  Procedure Laterality Date  . COLONOSCOPY  07/26/2011   Procedure: COLONOSCOPY;  Surgeon: Cleotis Nipper, MD;  Location: [facility] ENDOSCOPY;  Service: Endoscopy;  Laterality: N/A;  . EYE SURGERY     bilateral cataract  surgery  . MULTIPLE EXTRACTIONS WITH ALVEOLOPLASTY N/A 05/27/2014   Procedure: Extraction of tooth #'s 1,6,7,8,10,11,13,21,22,23,24,25,26,27 and 28 with alveoloplasty;  Surgeon: Lenn Cal, DDS;  Location: [facility] ORS;  Service: Oral Surgery;  Laterality: N/A;  . porta cath      for chemotherapy- right chest area    ALLERGIES/ADVERSE  DRUG REACTIONS: Allergies  Allergen Reactions  . Lactose Intolerance (Gi) Nausea And Vomiting    Pt said he is not ??    MEDICATIONS: Current Outpatient Prescriptions  Medication Sig Dispense Refill  . allopurinol (ZYLOPRIM) 100 MG tablet Take 100 mg by mouth 2 (two) times daily.     Marland Kitchen amoxicillin-clavulanate (AUGMENTIN) 875-125 MG tablet Take 1 tablet by mouth every 12 (twelve) hours. 6 tablet 0  . aspirin EC 81 MG tablet Take 81 mg by mouth daily.    Marland Kitchen atorvastatin (LIPITOR) 20 MG tablet Take 20 mg by mouth at bedtime.     . brimonidine (ALPHAGAN) 0.15 % ophthalmic solution Place 1 drop into the right eye 3 (three) times daily.    . cholecalciferol (VITAMIN D) 1000 UNITS tablet Take 1,000 Units by mouth daily.    . dorzolamide-timolol (COSOPT) 22.3-6.8 MG/ML ophthalmic solution Place 1 drop into both eyes 2 (two) times daily.     . fluorometholone (FML) 0.1 % ophthalmic suspension Place 1 drop into both eyes daily.    Marland Kitchen latanoprost (XALATAN) 0.005 % ophthalmic solution Place 1 drop into the right eye at bedtime.    Marland Kitchen lenalidomide (REVLIMID) 5 MG capsule Take 1 capsule (5 mg total) by mouth daily. Take 1 capsule daily for 21 days and then rest 7 days 21 capsule 0  . lidocaine-prilocaine (EMLA) cream Apply 1 application topically as needed. 30 g 1  . loperamide (IMODIUM A-D) 2 MG tablet 1 po after each watery BM.  Maximum 6 per 24hrs. 30 tablet PRN  No current facility-administered medications for this visit.     C/C: Evaluation of upper lower complete dentures.   HPI:  Calvin Mccormick is a 70 year old male that presents for periodic oral exam and evaluation of upper and lower complete dentures. Patient had extraction of remaining teeth with alveoloplasty on 05/27/2014 as part of a pre-Zometa therapy dental protocol. Upper and lower complete dentures were then fabricated and inserted on 09/01/2014. Patient has been seen for multiple denture adjustment appointment since then. Patient has  multiple myeloma that is in remission. Patient now having Zometa therapy twice a year with Dr. Alvy Bimler. Patient now presents for a periodic oral examination and evaluation of upper and lower complete dentures.  Patient currently denies having any problems with his upper lower complete dentures.  DENTAL EXAM: General: Patient is a well-developed, well-nourished male in no acute distress. Vitals: BP (!) 137/58   Pulse (!) 51   Temp 98.4 F (36.9 C) (Oral)  Extraoral Exam: There is no palpable lymphadenopathy. The patient denies acute TMJ symptoms. Intraoral  Exam: Patient has normal saliva. There is no evidence of denture irritation. There is atrophy of the edentulous alveolar ridges. There is no evidence of osteonecrosis of the jaw. Dentition: Patient is edentulous. Prosthodontic: Patient has upper and lower complete dentures. The dentures are stable and retentive. Pressure indicating paste was applied to the dentures and minimal adjustments made as needed. Dentures were polished. Occlusion:  Dentures were adjusted in centric relation and protrusive strokes. Patient with tendency to protrude to anterior position. Patient was instructed on finding maximum intercuspation position. Patient expresses understanding.  Assessments: 1. Multiple myeloma in remission 2. Zometa therapy on a twice year basis with risk for osteonecrosis of the jaw 3. Completele edentulous 4. Atrophy of the edentulous alveolar ridges 5. Upper and lower complete dentures  Plan:  1. Keep dentures out if sore spots arise. 2. Use salt water rinses as needed to aid healing. 3. Return to clinic as scheduled for denture evaluation. Call if problems arise before then.   Lenn Cal, DDS

## 2015-08-07 NOTE — Patient Instructions (Signed)
Plan:  1. Keep dentures out if sore spots arise. 2. Use salt water rinses as needed to aid healing. 3. Return to clinic as scheduled for denture evaluation. Call if problems arise before then.   Augustino Savastano F. Kaedyn Belardo, DDS 

## 2015-08-15 ENCOUNTER — Other Ambulatory Visit (HOSPITAL_BASED_OUTPATIENT_CLINIC_OR_DEPARTMENT_OTHER): Payer: Medicare PPO

## 2015-08-15 DIAGNOSIS — N183 Chronic kidney disease, stage 3 unspecified: Secondary | ICD-10-CM

## 2015-08-15 DIAGNOSIS — C9001 Multiple myeloma in remission: Secondary | ICD-10-CM

## 2015-08-15 LAB — CBC WITH DIFFERENTIAL/PLATELET
BASO%: 1 % (ref 0.0–2.0)
Basophils Absolute: 0.1 10*3/uL (ref 0.0–0.1)
EOS ABS: 0.2 10*3/uL (ref 0.0–0.5)
EOS%: 3.2 % (ref 0.0–7.0)
HEMATOCRIT: 28.2 % — AB (ref 38.4–49.9)
HGB: 9.1 g/dL — ABNORMAL LOW (ref 13.0–17.1)
LYMPH#: 2.2 10*3/uL (ref 0.9–3.3)
LYMPH%: 31 % (ref 14.0–49.0)
MCH: 28.9 pg (ref 27.2–33.4)
MCHC: 32.3 g/dL (ref 32.0–36.0)
MCV: 89.3 fL (ref 79.3–98.0)
MONO#: 0.5 10*3/uL (ref 0.1–0.9)
MONO%: 6.9 % (ref 0.0–14.0)
NEUT%: 57.9 % (ref 39.0–75.0)
NEUTROS ABS: 4.2 10*3/uL (ref 1.5–6.5)
PLATELETS: 183 10*3/uL (ref 140–400)
RBC: 3.16 10*6/uL — ABNORMAL LOW (ref 4.20–5.82)
RDW: 17.7 % — ABNORMAL HIGH (ref 11.0–14.6)
WBC: 7.2 10*3/uL (ref 4.0–10.3)

## 2015-08-15 LAB — COMPREHENSIVE METABOLIC PANEL
ALK PHOS: 64 U/L (ref 40–150)
ALT: 26 U/L (ref 0–55)
ANION GAP: 7 meq/L (ref 3–11)
AST: 21 U/L (ref 5–34)
Albumin: 3.4 g/dL — ABNORMAL LOW (ref 3.5–5.0)
BILIRUBIN TOTAL: 0.76 mg/dL (ref 0.20–1.20)
BUN: 16.3 mg/dL (ref 7.0–26.0)
CALCIUM: 8.4 mg/dL (ref 8.4–10.4)
CO2: 24 mEq/L (ref 22–29)
CREATININE: 1.9 mg/dL — AB (ref 0.7–1.3)
Chloride: 108 mEq/L (ref 98–109)
EGFR: 41 mL/min/{1.73_m2} — AB (ref >90)
Glucose: 88 mg/dl (ref 70–140)
Potassium: 3.8 mEq/L (ref 3.5–5.1)
Sodium: 138 mEq/L (ref 136–145)
Total Protein: 7.4 g/dL (ref 6.4–8.3)

## 2015-08-16 LAB — KAPPA/LAMBDA LIGHT CHAINS
Ig Kappa Free Light Chain: 236.8 mg/L — ABNORMAL HIGH (ref 3.3–19.4)
Ig Lambda Free Light Chain: 20.8 mg/L (ref 5.7–26.3)
Kappa/Lambda FluidC Ratio: 11.38 — ABNORMAL HIGH (ref 0.26–1.65)

## 2015-08-17 LAB — MULTIPLE MYELOMA PANEL, SERUM
ALBUMIN SERPL ELPH-MCNC: 3.4 g/dL (ref 2.9–4.4)
Albumin/Glob SerPl: 1 (ref 0.7–1.7)
Alpha 1: 0.2 g/dL (ref 0.0–0.4)
Alpha2 Glob SerPl Elph-Mcnc: 0.7 g/dL (ref 0.4–1.0)
B-Globulin SerPl Elph-Mcnc: 1 g/dL (ref 0.7–1.3)
GAMMA GLOB SERPL ELPH-MCNC: 1.6 g/dL (ref 0.4–1.8)
Globulin, Total: 3.5 g/dL (ref 2.2–3.9)
IGA/IMMUNOGLOBULIN A, SERUM: 93 mg/dL (ref 61–437)
IgG, Qn, Serum: 1700 mg/dL — ABNORMAL HIGH (ref 700–1600)
IgM, Qn, Serum: 13 mg/dL — ABNORMAL LOW (ref 15–143)
M Protein SerPl Elph-Mcnc: 1.2 g/dL — ABNORMAL HIGH
TOTAL PROTEIN: 6.9 g/dL (ref 6.0–8.5)

## 2015-08-22 ENCOUNTER — Other Ambulatory Visit: Payer: Self-pay | Admitting: *Deleted

## 2015-08-22 ENCOUNTER — Encounter: Payer: Self-pay | Admitting: Hematology and Oncology

## 2015-08-22 ENCOUNTER — Ambulatory Visit (HOSPITAL_BASED_OUTPATIENT_CLINIC_OR_DEPARTMENT_OTHER): Payer: Medicare PPO

## 2015-08-22 ENCOUNTER — Ambulatory Visit (HOSPITAL_BASED_OUTPATIENT_CLINIC_OR_DEPARTMENT_OTHER): Payer: Medicare PPO | Admitting: Hematology and Oncology

## 2015-08-22 VITALS — BP 132/60 | HR 59 | Temp 98.2°F | Resp 18 | Ht 71.0 in | Wt 168.8 lb

## 2015-08-22 DIAGNOSIS — N183 Chronic kidney disease, stage 3 unspecified: Secondary | ICD-10-CM

## 2015-08-22 DIAGNOSIS — C9001 Multiple myeloma in remission: Secondary | ICD-10-CM | POA: Diagnosis not present

## 2015-08-22 DIAGNOSIS — N189 Chronic kidney disease, unspecified: Secondary | ICD-10-CM

## 2015-08-22 DIAGNOSIS — D631 Anemia in chronic kidney disease: Secondary | ICD-10-CM

## 2015-08-22 MED ORDER — ZOLEDRONIC ACID 4 MG/5ML IV CONC
3.0000 mg | Freq: Once | INTRAVENOUS | Status: AC
  Administered 2015-08-22: 3 mg via INTRAVENOUS
  Filled 2015-08-22: qty 3.75

## 2015-08-22 MED ORDER — SODIUM CHLORIDE 0.9 % IV SOLN
Freq: Once | INTRAVENOUS | Status: AC
Start: 2015-08-22 — End: 2015-08-22
  Administered 2015-08-22: 13:00:00 via INTRAVENOUS

## 2015-08-22 MED ORDER — HEPARIN SOD (PORK) LOCK FLUSH 100 UNIT/ML IV SOLN
500.0000 [IU] | Freq: Once | INTRAVENOUS | Status: AC | PRN
  Administered 2015-08-22: 500 [IU]
  Filled 2015-08-22: qty 5

## 2015-08-22 MED ORDER — SODIUM CHLORIDE 0.9 % IJ SOLN
10.0000 mL | INTRAMUSCULAR | Status: DC | PRN
  Administered 2015-08-22: 10 mL
  Filled 2015-08-22: qty 10

## 2015-08-22 MED ORDER — LENALIDOMIDE 5 MG PO CAPS: 5.0000 mg | ORAL_CAPSULE | Freq: Every day | ORAL | 0 refills | Status: DC

## 2015-08-22 NOTE — Progress Notes (Signed)
[place] OFFICE PROGRESS NOTE  Patient Care Team: Lucianne Lei, MD as PCP - General (Family Medicine) Heath Lark, MD as Consulting Physician (Hematology and Oncology)  SUMMARY OF ONCOLOGIC HISTORY:   Multiple myeloma in remission ([place])   12/12/2010 Initial Diagnosis    Multiple myeloma     02/16/2014 Imaging    Skeletal survey showed diffuse osteopenia     03/02/2014 Bone Marrow Biopsy    Accession: [id number] BM biopsy showed only 5 % plasma cells. However, the biopsy was difficult and the bone was fragmented during the procedure. Cytogenetics and FISH study is normal     03/03/2014 Procedure    he has port placement     03/10/2014 - 05/19/2014 Chemotherapy    he received elotuzumab and revlimid     05/20/2014 -  Chemotherapy    Treatment is switched to maintenance Revlimid only     11/15/2014 Adverse Reaction    He has mild worsening anemia. Does of Revlimid reduced to 5 mg 21 days on, 7 days off      INTERVAL HISTORY: Please see below for problem oriented charting. He returns for follow-up. He denies recent dental issues. No new bone pain. Denies recent infection. The patient denies any recent signs or symptoms of bleeding such as spontaneous epistaxis, hematuria or hematochezia.   REVIEW OF SYSTEMS:   Constitutional: Denies fevers, chills or abnormal weight loss Eyes: Denies blurriness of vision Ears, nose, mouth, throat, and face: Denies mucositis or sore throat Respiratory: Denies cough, dyspnea or wheezes Cardiovascular: Denies palpitation, chest discomfort or lower extremity swelling Gastrointestinal:  Denies nausea, heartburn or change in bowel habits Skin: Denies abnormal skin rashes Lymphatics: Denies new lymphadenopathy or easy bruising Neurological:Denies numbness, tingling or new weaknesses Behavioral/Psych: Mood is stable, no new changes  All other systems were reviewed with the patient and are negative.  I have reviewed the past medical  history, past surgical history, social history and family history with the patient and they are unchanged from previous note.  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  MEDICATIONS:  Current Outpatient Prescriptions  Medication Sig Dispense Refill  . allopurinol (ZYLOPRIM) 100 MG tablet Take 100 mg by mouth 2 (two) times daily.     Marland Kitchen amoxicillin-clavulanate (AUGMENTIN) 875-125 MG tablet Take 1 tablet by mouth every 12 (twelve) hours. 6 tablet 0  . aspirin EC 81 MG tablet Take 81 mg by mouth daily.    Marland Kitchen atorvastatin (LIPITOR) 20 MG tablet Take 20 mg by mouth at bedtime.     . brimonidine (ALPHAGAN) 0.15 % ophthalmic solution Place 1 drop into the right eye 3 (three) times daily.    . cholecalciferol (VITAMIN D) 1000 UNITS tablet Take 1,000 Units by mouth daily.    . dorzolamide-timolol (COSOPT) 22.3-6.8 MG/ML ophthalmic solution Place 1 drop into both eyes 2 (two) times daily.     . fluorometholone (FML) 0.1 % ophthalmic suspension Place 1 drop into both eyes daily.    Marland Kitchen latanoprost (XALATAN) 0.005 % ophthalmic solution Place 1 drop into the right eye at bedtime.    Marland Kitchen lenalidomide (REVLIMID) 5 MG capsule Take 1 capsule (5 mg total) by mouth daily. Take 1 capsule daily for 21 days and then rest 7 days 21 capsule 0  . lidocaine-prilocaine (EMLA) cream Apply 1 application topically as needed. 30 g 1  . loperamide (IMODIUM A-D) 2 MG tablet 1 po after each watery BM.  Maximum 6 per 24hrs. 30 tablet PRN  No current facility-administered medications for this visit.     PHYSICAL EXAMINATION: ECOG PERFORMANCE STATUS: 1 - Symptomatic but completely ambulatory  Vitals:   08/22/15 1142  BP: 132/60  Pulse: (!) 59  Resp: 18  Temp: 98.2 F (36.8 C)   Filed Weights   08/22/15 1142  Weight: 168 lb 12.8 oz (76.6 kg)    GENERAL:alert, no distress and comfortable SKIN: skin color, texture, turgor are normal, no rashes or significant lesions EYES: normal, Conjunctiva are pink and  non-injected, sclera clear OROPHARYNX:no exudate, no erythema and lips, buccal mucosa, and tongue normal  NECK: supple, thyroid normal size, non-tender, without nodularity LYMPH:  no palpable lymphadenopathy in the cervical, axillary or inguinal LUNGS: clear to auscultation and percussion with normal breathing effort HEART: regular rate & rhythm and no murmurs and no lower extremity edema ABDOMEN:abdomen soft, non-tender and normal bowel sounds Musculoskeletal:no cyanosis of digits and no clubbing  NEURO: alert & oriented x 3 with fluent speech, no focal motor/sensory deficits  LABORATORY DATA:  I have reviewed the data as listed    Component Value Date/Time   NA 138 08/15/2015 1113   K 3.8 08/15/2015 1113   CL 108 04/10/2015 0405   CL 107 06/29/2012 1131   CO2 24 08/15/2015 1113   GLUCOSE 88 08/15/2015 1113   GLUCOSE 143 (H) 06/29/2012 1131   BUN 16.3 08/15/2015 1113   CREATININE 1.9 (H) 08/15/2015 1113   CALCIUM 8.4 08/15/2015 1113   PROT 7.4 08/15/2015 1113   ALBUMIN 3.4 (L) 08/15/2015 1113   AST 21 08/15/2015 1113   ALT 26 08/15/2015 1113   ALKPHOS 64 08/15/2015 1113   BILITOT 0.76 08/15/2015 1113   GFRNONAA 38 (L) 04/10/2015 0405   GFRAA 44 (L) 04/10/2015 0405    No results found for: SPEP, UPEP  Lab Results  Component Value Date   WBC 7.2 08/15/2015   NEUTROABS 4.2 08/15/2015   HGB 9.1 (L) 08/15/2015   HCT 28.2 (L) 08/15/2015   MCV 89.3 08/15/2015   PLT 183 08/15/2015      Chemistry      Component Value Date/Time   NA 138 08/15/2015 1113   K 3.8 08/15/2015 1113   CL 108 04/10/2015 0405   CL 107 06/29/2012 1131   CO2 24 08/15/2015 1113   BUN 16.3 08/15/2015 1113   CREATININE 1.9 (H) 08/15/2015 1113      Component Value Date/Time   CALCIUM 8.4 08/15/2015 1113   ALKPHOS 64 08/15/2015 1113   AST 21 08/15/2015 1113   ALT 26 08/15/2015 1113   BILITOT 0.76 08/15/2015 1113       ASSESSMENT & PLAN:  Multiple myeloma in remission ([place]) His recent blood  work shows stable disease. Serum light chains elevated likely related to chronic kidney disease We will continue Revlimid for now and Zometa at reduced dose every 6 months. He will continue calcium, vitamin D and aspirin. He denies recent dental problems  Chronic renal insufficiency, stage III (moderate) This is chronic in nature. It is stable. Recommend observation only. We'll give him a reduced dose Zometa twice a year due to chronic renal failure His next dose is due today   Anemia in chronic renal disease  His anemia is stable He is not symptomatic. Recommend observation only.   Orders Placed This Encounter  Procedures  . CBC with Differential/Platelet    Standing Status:   Future    Standing Expiration Date:   09/25/2016  . Comprehensive metabolic panel  Standing Status:   Future    Standing Expiration Date:   09/25/2016  . Kappa/lambda light chains    Standing Status:   Future    Standing Expiration Date:   09/25/2016  . Multiple Myeloma Panel (SPEP&IFE w/QIG)    Standing Status:   Future    Standing Expiration Date:   09/25/2016   All questions were answered. The patient knows to call the clinic with any problems, questions or concerns. No barriers to learning was detected. I spent 15 minutes counseling the patient face to face. The total time spent in the appointment was 20 minutes and more than 50% was on counseling and review of test results     [facility], [place], MD 08/22/2015 4:33 PM

## 2015-08-22 NOTE — Progress Notes (Signed)
Spoke with Tammi,RN, per dr Alvy Bimler, ok to treat with Zometa today, with ceat of 1.9 from 08/15/15

## 2015-08-22 NOTE — Assessment & Plan Note (Signed)
This is chronic in nature. It is stable. Recommend observation only. We'll give him a reduced dose Zometa twice a year due to chronic renal failure His next dose is due today

## 2015-08-22 NOTE — Assessment & Plan Note (Signed)
His recent blood work shows stable disease. Serum light chains elevated likely related to chronic kidney disease We will continue Revlimid for now and Zometa at reduced dose every 6 months. He will continue calcium, vitamin D and aspirin. He denies recent dental problems

## 2015-08-22 NOTE — Assessment & Plan Note (Signed)
His anemia is stable He is not symptomatic. Recommend observation only.

## 2015-08-22 NOTE — Patient Instructions (Signed)

## 2015-08-25 ENCOUNTER — Telehealth: Payer: Self-pay | Admitting: Hematology and Oncology

## 2015-08-25 NOTE — Telephone Encounter (Signed)
Called patient to confirm appointment. No V/M available. Appt letter and schd mailed.

## 2015-09-20 ENCOUNTER — Other Ambulatory Visit: Payer: Self-pay | Admitting: *Deleted

## 2015-09-20 MED ORDER — LENALIDOMIDE 5 MG PO CAPS: 5.0000 mg | ORAL_CAPSULE | Freq: Every day | ORAL | 0 refills | Status: DC

## 2015-10-18 ENCOUNTER — Other Ambulatory Visit: Payer: Self-pay | Admitting: *Deleted

## 2015-10-18 MED ORDER — LENALIDOMIDE 5 MG PO CAPS: 5.0000 mg | ORAL_CAPSULE | Freq: Every day | ORAL | 0 refills | Status: DC

## 2015-10-19 ENCOUNTER — Telehealth: Payer: Self-pay | Admitting: *Deleted

## 2015-10-19 NOTE — Telephone Encounter (Signed)
VM from [facility] to check on refill request on Revlimid.  Called [facility] back and they confirm they have received the refill we sent yesterday.

## 2015-11-03 ENCOUNTER — Other Ambulatory Visit: Payer: Self-pay | Admitting: *Deleted

## 2015-11-03 NOTE — Patient Outreach (Signed)
[facility]) Care Management  11/03/2015  Calvin Mccormick 1945-10-10 [id number]   RN Health Coach  Attempted screening  outreach call to patient.  Patient was unavailable.Hipaa compliance voicemail left with return call back number. Plan: RN will call patient again within 10 business days.. .   [place] Care Management [phone]

## 2015-11-16 ENCOUNTER — Other Ambulatory Visit: Payer: Self-pay | Admitting: *Deleted

## 2015-11-16 ENCOUNTER — Other Ambulatory Visit (HOSPITAL_BASED_OUTPATIENT_CLINIC_OR_DEPARTMENT_OTHER): Payer: Medicare Other

## 2015-11-16 DIAGNOSIS — C9001 Multiple myeloma in remission: Secondary | ICD-10-CM

## 2015-11-16 LAB — CBC WITH DIFFERENTIAL/PLATELET
BASO%: 1.2 % (ref 0.0–2.0)
Basophils Absolute: 0.1 10*3/uL (ref 0.0–0.1)
EOS ABS: 0.2 10*3/uL (ref 0.0–0.5)
EOS%: 3.3 % (ref 0.0–7.0)
HCT: 26.9 % — ABNORMAL LOW (ref 38.4–49.9)
HGB: 8.9 g/dL — ABNORMAL LOW (ref 13.0–17.1)
LYMPH%: 42.8 % (ref 14.0–49.0)
MCH: 28.7 pg (ref 27.2–33.4)
MCHC: 33.1 g/dL (ref 32.0–36.0)
MCV: 86.8 fL (ref 79.3–98.0)
MONO#: 0.5 10*3/uL (ref 0.1–0.9)
MONO%: 9.7 % (ref 0.0–14.0)
NEUT%: 43 % (ref 39.0–75.0)
NEUTROS ABS: 2.2 10*3/uL (ref 1.5–6.5)
Platelets: 147 10*3/uL (ref 140–400)
RBC: 3.1 10*6/uL — AB (ref 4.20–5.82)
RDW: 16.2 % — AB (ref 11.0–14.6)
WBC: 5.1 10*3/uL (ref 4.0–10.3)
lymph#: 2.2 10*3/uL (ref 0.9–3.3)

## 2015-11-16 LAB — COMPREHENSIVE METABOLIC PANEL
ALT: 24 U/L (ref 0–55)
AST: 28 U/L (ref 5–34)
Albumin: 3.3 g/dL — ABNORMAL LOW (ref 3.5–5.0)
Alkaline Phosphatase: 80 U/L (ref 40–150)
Anion Gap: 9 mEq/L (ref 3–11)
BILIRUBIN TOTAL: 0.71 mg/dL (ref 0.20–1.20)
BUN: 20.1 mg/dL (ref 7.0–26.0)
CO2: 22 meq/L (ref 22–29)
Calcium: 8.3 mg/dL — ABNORMAL LOW (ref 8.4–10.4)
Chloride: 106 mEq/L (ref 98–109)
Creatinine: 1.9 mg/dL — ABNORMAL HIGH (ref 0.7–1.3)
EGFR: 39 mL/min/{1.73_m2} — AB (ref >90)
GLUCOSE: 87 mg/dL (ref 70–140)
POTASSIUM: 3.9 meq/L (ref 3.5–5.1)
SODIUM: 137 meq/L (ref 136–145)
TOTAL PROTEIN: 7.7 g/dL (ref 6.4–8.3)

## 2015-11-16 MED ORDER — LENALIDOMIDE 5 MG PO CAPS: 5.0000 mg | ORAL_CAPSULE | Freq: Every day | ORAL | 0 refills | Status: DC

## 2015-11-17 ENCOUNTER — Other Ambulatory Visit: Payer: Self-pay | Admitting: *Deleted

## 2015-11-17 LAB — KAPPA/LAMBDA LIGHT CHAINS
IG KAPPA FREE LIGHT CHAIN: 265.9 mg/L — AB (ref 3.3–19.4)
IG LAMBDA FREE LIGHT CHAIN: 19.9 mg/L (ref 5.7–26.3)
KAPPA/LAMBDA FLC RATIO: 13.36 — AB (ref 0.26–1.65)

## 2015-11-17 NOTE — Patient Outreach (Signed)
[facility]) Care Management  11/17/2015  ANDERS COFFEE [birth year] [id number]   RN Health Coach attempted #2  Follow up outreach screening call to patient.  Patient was unavailable. HIPPA compliance voicemail message was left with return callback number.  Plan: RN will call patient again within 10 business days .    [place] Care Management [phone]

## 2015-11-20 LAB — MULTIPLE MYELOMA PANEL, SERUM
ALBUMIN/GLOB SERPL: 1 (ref 0.7–1.7)
ALPHA 1: 0.3 g/dL (ref 0.0–0.4)
Albumin SerPl Elph-Mcnc: 3.5 g/dL (ref 2.9–4.4)
Alpha2 Glob SerPl Elph-Mcnc: 0.8 g/dL (ref 0.4–1.0)
B-Globulin SerPl Elph-Mcnc: 1.1 g/dL (ref 0.7–1.3)
GAMMA GLOB SERPL ELPH-MCNC: 1.5 g/dL (ref 0.4–1.8)
GLOBULIN, TOTAL: 3.7 g/dL (ref 2.2–3.9)
IGA/IMMUNOGLOBULIN A, SERUM: 82 mg/dL (ref 61–437)
IGM (IMMUNOGLOBIN M), SRM: 11 mg/dL — AB (ref 15–143)
IgG, Qn, Serum: 1625 mg/dL — ABNORMAL HIGH (ref 700–1600)
M Protein SerPl Elph-Mcnc: 1.2 g/dL — ABNORMAL HIGH
Total Protein: 7.2 g/dL (ref 6.0–8.5)

## 2015-11-23 ENCOUNTER — Ambulatory Visit (HOSPITAL_BASED_OUTPATIENT_CLINIC_OR_DEPARTMENT_OTHER): Payer: Medicare Other

## 2015-11-23 ENCOUNTER — Telehealth: Payer: Self-pay | Admitting: Hematology and Oncology

## 2015-11-23 ENCOUNTER — Ambulatory Visit (HOSPITAL_BASED_OUTPATIENT_CLINIC_OR_DEPARTMENT_OTHER): Payer: Medicare Other | Admitting: Hematology and Oncology

## 2015-11-23 ENCOUNTER — Encounter: Payer: Self-pay | Admitting: Hematology and Oncology

## 2015-11-23 VITALS — BP 139/56 | HR 85 | Temp 98.5°F | Resp 18 | Ht 71.0 in | Wt 166.9 lb

## 2015-11-23 DIAGNOSIS — C9001 Multiple myeloma in remission: Secondary | ICD-10-CM | POA: Diagnosis not present

## 2015-11-23 DIAGNOSIS — M542 Cervicalgia: Secondary | ICD-10-CM | POA: Diagnosis not present

## 2015-11-23 DIAGNOSIS — N183 Chronic kidney disease, stage 3 unspecified: Secondary | ICD-10-CM

## 2015-11-23 DIAGNOSIS — D631 Anemia in chronic kidney disease: Secondary | ICD-10-CM | POA: Diagnosis not present

## 2015-11-23 MED ORDER — HEPARIN SOD (PORK) LOCK FLUSH 100 UNIT/ML IV SOLN
500.0000 [IU] | Freq: Once | INTRAVENOUS | Status: AC | PRN
  Administered 2015-11-23: 500 [IU]
  Filled 2015-11-23: qty 5

## 2015-11-23 MED ORDER — SODIUM CHLORIDE 0.9 % IJ SOLN
10.0000 mL | INTRAMUSCULAR | Status: DC | PRN
  Administered 2015-11-23: 10 mL
  Filled 2015-11-23: qty 10

## 2015-11-23 NOTE — Assessment & Plan Note (Signed)
His recent blood work shows stable disease. Serum light chains elevation is likely related to chronic kidney disease We will continue Revlimid for now and Zometa at reduced dose every 6 months. He will continue calcium, vitamin D and aspirin. He denies recent dental problems

## 2015-11-23 NOTE — Assessment & Plan Note (Signed)
This is chronic in nature. It is stable. Recommend observation only. We'll give him a reduced dose Zometa twice a year due to chronic renal failure I will not give him Rx today and defer to next visit

## 2015-11-23 NOTE — Assessment & Plan Note (Signed)
His anemia is stable He is not symptomatic. Recommend observation only.

## 2015-11-23 NOTE — Progress Notes (Signed)
[facility] OFFICE PROGRESS NOTE  Patient Care Team: Lucianne Lei, MD as PCP - General (Family Medicine) Heath Lark, MD as Consulting Physician (Hematology and Oncology) Verlin Grills, RN as [place] Management  SUMMARY OF ONCOLOGIC HISTORY:   Multiple myeloma in remission (Calvin Mccormick)   12/12/2010 Initial Diagnosis    Multiple myeloma      02/16/2014 Imaging    Skeletal survey showed diffuse osteopenia      03/02/2014 Bone Marrow Biopsy    Accession: [id number] BM biopsy showed only 5 % plasma cells. However, the biopsy was difficult and the bone was fragmented during the procedure. Cytogenetics and FISH study is normal      03/03/2014 Procedure    he has port placement      03/10/2014 - 05/19/2014 Chemotherapy    he received elotuzumab and revlimid      05/20/2014 -  Chemotherapy    Treatment is switched to maintenance Revlimid only      11/15/2014 Adverse Reaction    He has mild worsening anemia. Does of Revlimid reduced to 5 mg 21 days on, 7 days off       INTERVAL HISTORY: Please see below for problem oriented charting. He returns for follow-up. Denies recent infection. He had recent acute neck pain not related to injury. He has not taken any regular over-the-counter analgesics for this.   REVIEW OF SYSTEMS:   Constitutional: Denies fevers, chills or abnormal weight loss Eyes: Denies blurriness of vision Ears, nose, mouth, throat, and face: Denies mucositis or sore throat Respiratory: Denies cough, dyspnea or wheezes Cardiovascular: Denies palpitation, chest discomfort or lower extremity swelling Gastrointestinal:  Denies nausea, heartburn or change in bowel habits Skin: Denies abnormal skin rashes Lymphatics: Denies new lymphadenopathy or easy bruising Neurological:Denies numbness, tingling or new weaknesses Behavioral/Psych: Mood is stable, no new changes  All other systems were reviewed with the patient and are negative.  I  have reviewed the past medical history, past surgical history, social history and family history with the patient and they are unchanged from previous note.  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  MEDICATIONS:  Current Outpatient Prescriptions  Medication Sig Dispense Refill  . allopurinol (ZYLOPRIM) 100 MG tablet Take 100 mg by mouth 2 (two) times daily.     Marland Kitchen amoxicillin-clavulanate (AUGMENTIN) 875-125 MG tablet Take 1 tablet by mouth every 12 (twelve) hours. 6 tablet 0  . aspirin EC 81 MG tablet Take 81 mg by mouth daily.    Marland Kitchen atorvastatin (LIPITOR) 20 MG tablet Take 20 mg by mouth at bedtime.     . brimonidine (ALPHAGAN) 0.15 % ophthalmic solution Place 1 drop into the right eye 3 (three) times daily.    . cholecalciferol (VITAMIN D) 1000 UNITS tablet Take 1,000 Units by mouth daily.    . dorzolamide-timolol (COSOPT) 22.3-6.8 MG/ML ophthalmic solution Place 1 drop into both eyes 2 (two) times daily.     . fluorometholone (FML) 0.1 % ophthalmic suspension Place 1 drop into both eyes daily.    Marland Kitchen latanoprost (XALATAN) 0.005 % ophthalmic solution Place 1 drop into the right eye at bedtime.    Marland Kitchen lenalidomide (REVLIMID) 5 MG capsule Take 1 capsule (5 mg total) by mouth daily. Take 1 capsule daily for 21 days and then rest 7 days 21 capsule 0  . lidocaine-prilocaine (EMLA) cream Apply 1 application topically as needed. 30 g 1  . loperamide (IMODIUM A-D) 2 MG tablet 1 po after each watery  BM.  Maximum 6 per 24hrs. 30 tablet PRN   No current facility-administered medications for this visit.     PHYSICAL EXAMINATION: ECOG PERFORMANCE STATUS: 0 - Asymptomatic  Vitals:   11/23/15 1114  BP: (!) 139/56  Pulse: 85  Resp: 18  Temp: 98.5 F (36.9 C)   Filed Weights   11/23/15 1114  Weight: 166 lb 14.4 oz (75.7 kg)    GENERAL:alert, no distress and comfortable SKIN: skin color, texture, turgor are normal, no rashes or significant lesions EYES: normal, Conjunctiva are pink  and non-injected, sclera clear OROPHARYNX:no exudate, no erythema and lips, buccal mucosa, and tongue normal  NECK: supple, thyroid normal size, non-tender, without nodularity LYMPH:  no palpable lymphadenopathy in the cervical, axillary or inguinal LUNGS: clear to auscultation and percussion with normal breathing effort HEART: regular rate & rhythm and no murmurs and no lower extremity edema ABDOMEN:abdomen soft, non-tender and normal bowel sounds Musculoskeletal:no cyanosis of digits and no clubbing  NEURO: alert & oriented x 3 with fluent speech, no focal motor/sensory deficits  LABORATORY DATA:  I have reviewed the data as listed    Component Value Date/Time   NA 137 11/16/2015 1215   K 3.9 11/16/2015 1215   CL 108 04/10/2015 0405   CL 107 06/29/2012 1131   CO2 22 11/16/2015 1215   GLUCOSE 87 11/16/2015 1215   GLUCOSE 143 (H) 06/29/2012 1131   BUN 20.1 11/16/2015 1215   CREATININE 1.9 (H) 11/16/2015 1215   CALCIUM 8.3 (L) 11/16/2015 1215   PROT 7.2 11/16/2015 1216   PROT 7.7 11/16/2015 1215   ALBUMIN 3.3 (L) 11/16/2015 1215   AST 28 11/16/2015 1215   ALT 24 11/16/2015 1215   ALKPHOS 80 11/16/2015 1215   BILITOT 0.71 11/16/2015 1215   GFRNONAA 38 (L) 04/10/2015 0405   GFRAA 44 (L) 04/10/2015 0405    No results found for: SPEP, UPEP  Lab Results  Component Value Date   WBC 5.1 11/16/2015   NEUTROABS 2.2 11/16/2015   HGB 8.9 (L) 11/16/2015   HCT 26.9 (L) 11/16/2015   MCV 86.8 11/16/2015   PLT 147 11/16/2015      Chemistry      Component Value Date/Time   NA 137 11/16/2015 1215   K 3.9 11/16/2015 1215   CL 108 04/10/2015 0405   CL 107 06/29/2012 1131   CO2 22 11/16/2015 1215   BUN 20.1 11/16/2015 1215   CREATININE 1.9 (H) 11/16/2015 1215      Component Value Date/Time   CALCIUM 8.3 (L) 11/16/2015 1215   ALKPHOS 80 11/16/2015 1215   AST 28 11/16/2015 1215   ALT 24 11/16/2015 1215   BILITOT 0.71 11/16/2015 1215      ASSESSMENT & PLAN:  Multiple  myeloma in remission ([place]) His recent blood work shows stable disease. Serum light chains elevation is likely related to chronic kidney disease We will continue Revlimid for now and Zometa at reduced dose every 6 months. He will continue calcium, vitamin D and aspirin. He denies recent dental problems  Anemia in chronic renal disease  His anemia is stable He is not symptomatic. Recommend observation only.  Acute neck pain He has severe neck pain. I recommend over-the-counter Tylenol for now.  Chronic renal insufficiency, stage III (moderate) This is chronic in nature. It is stable. Recommend observation only. We'll give him a reduced dose Zometa twice a year due to chronic renal failure I will not give him Rx today and defer to next visit  Orders Placed This Encounter  Procedures  . CBC with Differential/Platelet    Standing Status:   Future    Standing Expiration Date:   12/27/2016  . Comprehensive metabolic panel    Standing Status:   Future    Standing Expiration Date:   12/27/2016  . Kappa/lambda light chains    Standing Status:   Future    Standing Expiration Date:   12/27/2016  . Multiple Myeloma Panel (SPEP&IFE w/QIG)    Standing Status:   Future    Standing Expiration Date:   12/27/2016   All questions were answered. The patient knows to call the clinic with any problems, questions or concerns. No barriers to learning was detected. I spent 15 minutes counseling the patient face to face. The total time spent in the appointment was 20 minutes and more than 50% was on counseling and review of test results     Heath Lark, MD 11/23/2015 12:01 PM

## 2015-11-23 NOTE — Telephone Encounter (Signed)
Appointments scheduled per 11/23/15 los. Copy of AVS report and appointment schedule was given to patient, per 11/23/15 los. °

## 2015-11-23 NOTE — Telephone Encounter (Signed)
Message sent to chemo scheduler to be added per 11/23/15 los.

## 2015-11-23 NOTE — Assessment & Plan Note (Signed)
He has severe neck pain. I recommend over-the-counter Tylenol for now.

## 2015-11-24 ENCOUNTER — Telehealth: Payer: Self-pay | Admitting: *Deleted

## 2015-11-24 NOTE — Telephone Encounter (Signed)
Per LOS I have scheduled appts and notified the scheduler 

## 2015-11-29 ENCOUNTER — Other Ambulatory Visit: Payer: Self-pay | Admitting: *Deleted

## 2015-11-29 NOTE — Patient Outreach (Signed)
[facility]) Care Management  11/29/2015  Calvin Mccormick 04-24-1945 [id number]    RN Health Coach  Attempted #3 screening  outreach call to patient.  Patient was unavailable. Line busy. Plan: RN will call patient again within 10 business days.  [place] Care Management [phone]

## 2015-12-07 DIAGNOSIS — I10 Essential (primary) hypertension: Secondary | ICD-10-CM | POA: Diagnosis not present

## 2015-12-07 DIAGNOSIS — N189 Chronic kidney disease, unspecified: Secondary | ICD-10-CM | POA: Diagnosis not present

## 2015-12-07 DIAGNOSIS — D649 Anemia, unspecified: Secondary | ICD-10-CM | POA: Diagnosis not present

## 2015-12-07 DIAGNOSIS — C61 Malignant neoplasm of prostate: Secondary | ICD-10-CM | POA: Diagnosis not present

## 2015-12-12 ENCOUNTER — Other Ambulatory Visit: Payer: Self-pay | Admitting: *Deleted

## 2015-12-12 NOTE — Patient Outreach (Signed)
[facility]) Care Management  12/12/2015  TRESHUN GIOVINO Jan 04, 1946 [id number]   RN Health Coach  attempted #4 Follow up outreach call to patient.  Patient was unavailable. HIPPA compliance voicemail message was left with return callback number.  Plan : unsuccessful letter sent then closure within 10 business days   [place] Management [phone]

## 2015-12-13 ENCOUNTER — Other Ambulatory Visit: Payer: Self-pay | Admitting: *Deleted

## 2015-12-13 MED ORDER — LENALIDOMIDE 5 MG PO CAPS: 5.0000 mg | ORAL_CAPSULE | Freq: Every day | ORAL | 0 refills | Status: DC

## 2015-12-26 ENCOUNTER — Ambulatory Visit: Payer: Self-pay | Admitting: *Deleted

## 2015-12-29 ENCOUNTER — Encounter: Payer: Self-pay | Admitting: *Deleted

## 2016-01-10 ENCOUNTER — Other Ambulatory Visit: Payer: Self-pay | Admitting: Hematology and Oncology

## 2016-01-11 ENCOUNTER — Telehealth: Payer: Self-pay | Admitting: *Deleted

## 2016-01-11 NOTE — Telephone Encounter (Signed)
Patient called and moved his flush appt from today to next week.

## 2016-01-15 ENCOUNTER — Ambulatory Visit (HOSPITAL_BASED_OUTPATIENT_CLINIC_OR_DEPARTMENT_OTHER): Payer: Medicare Other

## 2016-01-15 VITALS — BP 128/70 | HR 52 | Temp 97.6°F | Resp 18

## 2016-01-15 DIAGNOSIS — Z452 Encounter for adjustment and management of vascular access device: Secondary | ICD-10-CM

## 2016-01-15 DIAGNOSIS — N183 Chronic kidney disease, stage 3 unspecified: Secondary | ICD-10-CM

## 2016-01-15 DIAGNOSIS — C9001 Multiple myeloma in remission: Secondary | ICD-10-CM | POA: Diagnosis not present

## 2016-01-15 DIAGNOSIS — Z95828 Presence of other vascular implants and grafts: Secondary | ICD-10-CM | POA: Insufficient documentation

## 2016-01-15 MED ORDER — HEPARIN SOD (PORK) LOCK FLUSH 100 UNIT/ML IV SOLN
500.0000 [IU] | INTRAVENOUS | Status: DC | PRN
  Administered 2016-01-15: 500 [IU] via INTRAVENOUS
  Filled 2016-01-15: qty 5

## 2016-01-15 MED ORDER — SODIUM CHLORIDE 0.9 % IJ SOLN
10.0000 mL | INTRAMUSCULAR | Status: DC | PRN
  Administered 2016-01-15: 10 mL via INTRAVENOUS
  Filled 2016-01-15: qty 10

## 2016-02-05 ENCOUNTER — Encounter (HOSPITAL_COMMUNITY): Payer: Self-pay | Admitting: Dentistry

## 2016-02-06 ENCOUNTER — Other Ambulatory Visit: Payer: Self-pay | Admitting: Hematology and Oncology

## 2016-02-07 ENCOUNTER — Other Ambulatory Visit: Payer: Self-pay

## 2016-02-07 MED ORDER — LENALIDOMIDE 5 MG PO CAPS: ORAL_CAPSULE | ORAL | 0 refills | Status: DC

## 2016-02-07 NOTE — Telephone Encounter (Signed)
Please refill electronically 

## 2016-02-12 ENCOUNTER — Ambulatory Visit (HOSPITAL_COMMUNITY): Payer: Medicare Other | Admitting: Dentistry

## 2016-02-12 ENCOUNTER — Encounter (HOSPITAL_COMMUNITY): Payer: Self-pay | Admitting: Dentistry

## 2016-02-12 VITALS — BP 133/58 | HR 55 | Temp 98.8°F

## 2016-02-12 DIAGNOSIS — C9001 Multiple myeloma in remission: Secondary | ICD-10-CM | POA: Diagnosis not present

## 2016-02-12 DIAGNOSIS — Z923 Personal history of irradiation: Secondary | ICD-10-CM | POA: Diagnosis not present

## 2016-02-12 DIAGNOSIS — K08109 Complete loss of teeth, unspecified cause, unspecified class: Secondary | ICD-10-CM

## 2016-02-12 DIAGNOSIS — K0820 Unspecified atrophy of edentulous alveolar ridge: Secondary | ICD-10-CM

## 2016-02-12 DIAGNOSIS — Z463 Encounter for fitting and adjustment of dental prosthetic device: Secondary | ICD-10-CM

## 2016-02-12 DIAGNOSIS — Z972 Presence of dental prosthetic device (complete) (partial): Secondary | ICD-10-CM

## 2016-02-12 DIAGNOSIS — C9000 Multiple myeloma not having achieved remission: Secondary | ICD-10-CM

## 2016-02-12 NOTE — Patient Instructions (Signed)
Plan:  1. Keep dentures out if sore spots arise. 2. Use salt water rinses as needed to aid healing. 3. Return to clinic as scheduled for denture evaluation. Call if problems arise before then.   Ronald F. Kulinski, DDS 

## 2016-02-12 NOTE — Progress Notes (Signed)
02/12/2016  Patient Name:   Calvin Mccormick Date of Birth:   15-Oct-1945 Medical Record Number: [id number]  BP (!) 133/58 (BP Location: Left Arm)   Pulse (!) 55   Temp 98.8 F (37.1 C) (Oral)   Calvin Mccormick is a 71 year old male that presents for periodic oral exam and evaluation of upper and lower complete dentures. Patient had extraction of remaining teeth with alveoloplasty on 05/27/2014 as part of a pre-Zometa therapy dental protocol. Upper and lower complete dentures were then fabricated and inserted on 09/01/2014. Patient has been seen for multiple denture adjustment appointment since then. Patient has multiple myeloma that is in remission. Patient now having Zometa therapy every 12 weeks with Dr. Alvy Bimler. Patient now presents for a periodic oral examination and evaluation of upper and lower complete dentures.  Medical Hx Update:  Past Medical History:  Diagnosis Date  . Anemia 11/27/2010  . Cancer ([place])   . Chronic renal insufficiency, stage III (moderate) 06/17/2011   Due to myeloma & HTN  . CKD (chronic kidney disease), stage III 02/05/2011  . 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 Diarrhea 06/17/2011   Hospital admission 05/30/11 velcade toxicity? Infectious?  Marland Kitchen Hyperlipemia   . Hypertension, benign essential, goal below 140/90 11/27/2010  . Hypokalemia with normal acid-base balance 07/29/2011  . Hypomagnesemia 07/29/2011  . Seasonal allergies   .  Past Surgical History:  Procedure Laterality Date  . COLONOSCOPY  07/26/2011   Procedure: COLONOSCOPY;  Surgeon: Cleotis Nipper, MD;  Location: [facility] ENDOSCOPY;  Service: Endoscopy;  Laterality: N/A;  . EYE SURGERY     bilateral cataract  surgery  . MULTIPLE EXTRACTIONS WITH ALVEOLOPLASTY N/A 05/27/2014   Procedure: Extraction of tooth #'s 1,6,7,8,10,11,13,21,22,23,24,25,26,27 and 28 with alveoloplasty;  Surgeon: Lenn Cal, DDS;  Location: [facility] ORS;  Service: Oral Surgery;  Laterality: N/A;  . porta cath      for chemotherapy- right chest  area    ALLERGIES/ADVERSE DRUG REACTIONS: Allergies  Allergen Reactions  . Lactose Intolerance (Gi) Nausea And Vomiting    Pt said he is not ??    MEDICATIONS: Current Outpatient Prescriptions  Medication Sig Dispense Refill  . allopurinol (ZYLOPRIM) 100 MG tablet Take 100 mg by mouth 2 (two) times daily.     Marland Kitchen amoxicillin-clavulanate (AUGMENTIN) 875-125 MG tablet Take 1 tablet by mouth every 12 (twelve) hours. 6 tablet 0  . aspirin EC 81 MG tablet Take 81 mg by mouth daily.    Marland Kitchen atorvastatin (LIPITOR) 20 MG tablet Take 20 mg by mouth at bedtime.     . brimonidine (ALPHAGAN) 0.15 % ophthalmic solution Place 1 drop into the right eye 3 (three) times daily.    . cholecalciferol (VITAMIN D) 1000 UNITS tablet Take 1,000 Units by mouth daily.    . dorzolamide-timolol (COSOPT) 22.3-6.8 MG/ML ophthalmic solution Place 1 drop into both eyes 2 (two) times daily.     . fluorometholone (FML) 0.1 % ophthalmic suspension Place 1 drop into both eyes daily.    Marland Kitchen latanoprost (XALATAN) 0.005 % ophthalmic solution Place 1 drop into the right eye at bedtime.    Marland Kitchen lenalidomide (REVLIMID) 5 MG capsule TAKE 1 CAPSULE BY MOUTH ONE TIME DAILY FOR 21 DAYS, FOLLOWED BY 7 DAYS OFF 21 capsule 0  . lidocaine-prilocaine (EMLA) cream Apply 1 application topically as needed. 30 g 1  . loperamide (IMODIUM A-D) 2 MG tablet 1 po after each watery BM.  Maximum 6 per 24hrs. 30 tablet PRN   No  current facility-administered medications for this visit.     C/C: Evaluation of upper lower complete dentures.   HPI:  Calvin Mccormick is a 71 year old male that presents for periodic oral exam and evaluation of upper and lower complete dentures. Patient had extraction of remaining teeth with alveoloplasty on 05/27/2014 as part of a pre-Zometa therapy dental protocol. Upper and lower complete dentures were then fabricated and inserted on 09/01/2014. Patient has been seen for multiple denture adjustment appointment since then. Patient  has multiple myeloma that is in remission. Patient now having Zometa therapy twice a year with Dr. Alvy Bimler. Patient now presents for a periodic oral examination and evaluation of upper and lower complete dentures.  Patient currently denies having any problems with his upper and lower complete dentures.  DENTAL EXAM: General: Patient is a well-developed, well-nourished male in no acute distress. Vitals: BP (!) 133/58 (BP Location: Left Arm)   Pulse (!) 55   Temp 98.8 F (37.1 C) (Oral)  Extraoral Exam: There is no palpable lymphadenopathy. The patient denies acute TMJ symptoms. Intraoral  Exam: Patient has normal saliva. There is no evidence of denture irritation. There is atrophy of the edentulous alveolar ridges. There is no evidence of osteonecrosis of the jaw. Dentition: Patient is edentulous. Prosthodontic: Patient has upper and lower complete dentures. The dentures are stable and retentive. Pressure indicating paste was applied to the dentures and minimal adjustments made as needed. Dentures were polished. Occlusion:  Dentures were minimally adjusted in centric relation and protrusive strokes. Patient is finding maximum intercuspation position without difficulty today.  Assessments: 1. Multiple myeloma in remission 2. Zometa therapy every 12 weeks with risk for osteonecrosis of the jaw 3. Completely edentulous 4. Atrophy of the edentulous alveolar ridges 5. Upper and lower complete dentures are stable.  Plan:  1. Keep dentures out if sore spots arise. 2. Use salt water rinses as needed to aid healing. 3. Return to clinic as scheduled for denture evaluation. Call if problems arise before then.   Lenn Cal, DDS

## 2016-02-15 ENCOUNTER — Ambulatory Visit (HOSPITAL_BASED_OUTPATIENT_CLINIC_OR_DEPARTMENT_OTHER): Payer: Medicare Other

## 2016-02-15 ENCOUNTER — Other Ambulatory Visit (HOSPITAL_BASED_OUTPATIENT_CLINIC_OR_DEPARTMENT_OTHER): Payer: Medicare Other

## 2016-02-15 DIAGNOSIS — N183 Chronic kidney disease, stage 3 unspecified: Secondary | ICD-10-CM

## 2016-02-15 DIAGNOSIS — C9001 Multiple myeloma in remission: Secondary | ICD-10-CM

## 2016-02-15 DIAGNOSIS — Z95828 Presence of other vascular implants and grafts: Secondary | ICD-10-CM

## 2016-02-15 LAB — CBC WITH DIFFERENTIAL/PLATELET
BASO%: 1.5 % (ref 0.0–2.0)
Basophils Absolute: 0.1 10*3/uL (ref 0.0–0.1)
EOS%: 2.4 % (ref 0.0–7.0)
Eosinophils Absolute: 0.1 10*3/uL (ref 0.0–0.5)
HCT: 26.6 % — ABNORMAL LOW (ref 38.4–49.9)
HGB: 8.8 g/dL — ABNORMAL LOW (ref 13.0–17.1)
LYMPH%: 42.2 % (ref 14.0–49.0)
MCH: 29.1 pg (ref 27.2–33.4)
MCHC: 33.1 g/dL (ref 32.0–36.0)
MCV: 88.1 fL (ref 79.3–98.0)
MONO#: 0.6 10*3/uL (ref 0.1–0.9)
MONO%: 10.8 % (ref 0.0–14.0)
NEUT%: 43.1 % (ref 39.0–75.0)
NEUTROS ABS: 2.3 10*3/uL (ref 1.5–6.5)
Platelets: 167 10*3/uL (ref 140–400)
RBC: 3.02 10*6/uL — AB (ref 4.20–5.82)
RDW: 16.6 % — ABNORMAL HIGH (ref 11.0–14.6)
WBC: 5.4 10*3/uL (ref 4.0–10.3)
lymph#: 2.3 10*3/uL (ref 0.9–3.3)

## 2016-02-15 LAB — COMPREHENSIVE METABOLIC PANEL
ALT: 30 U/L (ref 0–55)
AST: 30 U/L (ref 5–34)
Albumin: 3.6 g/dL (ref 3.5–5.0)
Alkaline Phosphatase: 65 U/L (ref 40–150)
Anion Gap: 7 mEq/L (ref 3–11)
BILIRUBIN TOTAL: 0.54 mg/dL (ref 0.20–1.20)
BUN: 22.8 mg/dL (ref 7.0–26.0)
CHLORIDE: 106 meq/L (ref 98–109)
CO2: 23 meq/L (ref 22–29)
CREATININE: 1.9 mg/dL — AB (ref 0.7–1.3)
Calcium: 8.9 mg/dL (ref 8.4–10.4)
EGFR: 40 mL/min/{1.73_m2} — ABNORMAL LOW (ref >90)
GLUCOSE: 87 mg/dL (ref 70–140)
Potassium: 4.2 mEq/L (ref 3.5–5.1)
Sodium: 136 mEq/L (ref 136–145)
TOTAL PROTEIN: 7.5 g/dL (ref 6.4–8.3)

## 2016-02-15 MED ORDER — SODIUM CHLORIDE 0.9 % IJ SOLN
10.0000 mL | INTRAMUSCULAR | Status: DC | PRN
  Administered 2016-02-15: 10 mL via INTRAVENOUS
  Filled 2016-02-15: qty 10

## 2016-02-15 MED ORDER — HEPARIN SOD (PORK) LOCK FLUSH 100 UNIT/ML IV SOLN
500.0000 [IU] | INTRAVENOUS | Status: DC | PRN
  Administered 2016-02-15: 500 [IU] via INTRAVENOUS
  Filled 2016-02-15: qty 5

## 2016-02-16 LAB — KAPPA/LAMBDA LIGHT CHAINS
Ig Kappa Free Light Chain: 307.4 mg/L — ABNORMAL HIGH (ref 3.3–19.4)
Ig Lambda Free Light Chain: 14.9 mg/L (ref 5.7–26.3)
Kappa/Lambda FluidC Ratio: 20.63 — ABNORMAL HIGH (ref 0.26–1.65)

## 2016-02-19 LAB — MULTIPLE MYELOMA PANEL, SERUM
ALBUMIN SERPL ELPH-MCNC: 3.5 g/dL (ref 2.9–4.4)
ALPHA2 GLOB SERPL ELPH-MCNC: 0.7 g/dL (ref 0.4–1.0)
Albumin/Glob SerPl: 1 (ref 0.7–1.7)
Alpha 1: 0.2 g/dL (ref 0.0–0.4)
B-GLOBULIN SERPL ELPH-MCNC: 1 g/dL (ref 0.7–1.3)
GAMMA GLOB SERPL ELPH-MCNC: 1.7 g/dL (ref 0.4–1.8)
GLOBULIN, TOTAL: 3.6 g/dL (ref 2.2–3.9)
IgA, Qn, Serum: 69 mg/dL (ref 61–437)
IgM, Qn, Serum: 8 mg/dL — ABNORMAL LOW (ref 15–143)
M PROTEIN SERPL ELPH-MCNC: 1.5 g/dL — AB
TOTAL PROTEIN: 7.1 g/dL (ref 6.0–8.5)

## 2016-02-22 ENCOUNTER — Ambulatory Visit (HOSPITAL_BASED_OUTPATIENT_CLINIC_OR_DEPARTMENT_OTHER): Payer: Medicare Other | Admitting: Hematology and Oncology

## 2016-02-22 ENCOUNTER — Encounter: Payer: Self-pay | Admitting: Hematology and Oncology

## 2016-02-22 ENCOUNTER — Telehealth: Payer: Self-pay | Admitting: Hematology and Oncology

## 2016-02-22 ENCOUNTER — Telehealth: Payer: Self-pay

## 2016-02-22 ENCOUNTER — Ambulatory Visit (HOSPITAL_COMMUNITY)
Admission: RE | Admit: 2016-02-22 | Discharge: 2016-02-22 | Disposition: A | Discharge status: Home / Self Care | Payer: Medicare Other | Source: Ambulatory Visit | Attending: Hematology and Oncology | Admitting: Hematology and Oncology

## 2016-02-22 ENCOUNTER — Ambulatory Visit (HOSPITAL_BASED_OUTPATIENT_CLINIC_OR_DEPARTMENT_OTHER): Payer: Medicare Other

## 2016-02-22 VITALS — BP 150/59 | HR 83 | Temp 98.6°F | Resp 18 | Ht 71.0 in | Wt 173.3 lb

## 2016-02-22 DIAGNOSIS — C9002 Multiple myeloma in relapse: Secondary | ICD-10-CM | POA: Diagnosis not present

## 2016-02-22 DIAGNOSIS — G620 Drug-induced polyneuropathy: Secondary | ICD-10-CM | POA: Diagnosis not present

## 2016-02-22 DIAGNOSIS — N183 Chronic kidney disease, stage 3 unspecified: Secondary | ICD-10-CM

## 2016-02-22 DIAGNOSIS — D631 Anemia in chronic kidney disease: Secondary | ICD-10-CM | POA: Diagnosis not present

## 2016-02-22 DIAGNOSIS — C9001 Multiple myeloma in remission: Secondary | ICD-10-CM

## 2016-02-22 DIAGNOSIS — T451X5A Adverse effect of antineoplastic and immunosuppressive drugs, initial encounter: Secondary | ICD-10-CM

## 2016-02-22 DIAGNOSIS — Z7189 Other specified counseling: Secondary | ICD-10-CM

## 2016-02-22 LAB — CBC WITH DIFFERENTIAL/PLATELET
BASO%: 1.7 % (ref 0.0–2.0)
Basophils Absolute: 0.1 10*3/uL (ref 0.0–0.1)
EOS%: 3.9 % (ref 0.0–7.0)
Eosinophils Absolute: 0.2 10*3/uL (ref 0.0–0.5)
HCT: 26.2 % — ABNORMAL LOW (ref 38.4–49.9)
HEMOGLOBIN: 8.6 g/dL — AB (ref 13.0–17.1)
LYMPH%: 44.6 % (ref 14.0–49.0)
MCH: 29 pg (ref 27.2–33.4)
MCHC: 32.8 g/dL (ref 32.0–36.0)
MCV: 88.2 fL (ref 79.3–98.0)
MONO#: 0.3 10*3/uL (ref 0.1–0.9)
MONO%: 7.6 % (ref 0.0–14.0)
NEUT%: 42.2 % (ref 39.0–75.0)
NEUTROS ABS: 1.7 10*3/uL (ref 1.5–6.5)
Platelets: 176 10*3/uL (ref 140–400)
RBC: 2.97 10*6/uL — ABNORMAL LOW (ref 4.20–5.82)
RDW: 16.7 % — AB (ref 11.0–14.6)
WBC: 4.1 10*3/uL (ref 4.0–10.3)
lymph#: 1.8 10*3/uL (ref 0.9–3.3)

## 2016-02-22 LAB — COMPREHENSIVE METABOLIC PANEL
ALBUMIN: 3.6 g/dL (ref 3.5–5.0)
ALK PHOS: 58 U/L (ref 40–150)
ALT: 20 U/L (ref 0–55)
AST: 21 U/L (ref 5–34)
Anion Gap: 8 mEq/L (ref 3–11)
BUN: 26.3 mg/dL — AB (ref 7.0–26.0)
CO2: 23 mEq/L (ref 22–29)
Calcium: 8.8 mg/dL (ref 8.4–10.4)
Chloride: 104 mEq/L (ref 98–109)
Creatinine: 2.1 mg/dL — ABNORMAL HIGH (ref 0.7–1.3)
EGFR: 35 mL/min/{1.73_m2} — ABNORMAL LOW (ref >90)
GLUCOSE: 113 mg/dL (ref 70–140)
POTASSIUM: 4.1 meq/L (ref 3.5–5.1)
SODIUM: 136 meq/L (ref 136–145)
TOTAL PROTEIN: 7.4 g/dL (ref 6.4–8.3)
Total Bilirubin: 0.58 mg/dL (ref 0.20–1.20)

## 2016-02-22 LAB — TYPE AND SCREEN
ABO/RH(D): O POS
Antibody Screen: NEGATIVE
DAT, IGG: NEGATIVE

## 2016-02-22 MED ORDER — ZOLEDRONIC ACID 4 MG/5ML IV CONC
3.0000 mg | Freq: Once | INTRAVENOUS | Status: AC
  Administered 2016-02-22: 3 mg via INTRAVENOUS
  Filled 2016-02-22: qty 3.75

## 2016-02-22 MED ORDER — DEXAMETHASONE 4 MG PO TABS: ORAL_TABLET | ORAL | 4 refills | Status: DC

## 2016-02-22 MED ORDER — PROCHLORPERAZINE MALEATE 10 MG PO TABS: 10.0000 mg | ORAL_TABLET | Freq: Four times a day (QID) | ORAL | 1 refills | Status: DC | PRN

## 2016-02-22 MED ORDER — ACYCLOVIR 400 MG PO TABS: 400.0000 mg | ORAL_TABLET | Freq: Every day | ORAL | 11 refills | Status: DC

## 2016-02-22 MED ORDER — SODIUM CHLORIDE 0.9 % IJ SOLN
10.0000 mL | INTRAMUSCULAR | Status: DC | PRN
  Administered 2016-02-22: 10 mL
  Filled 2016-02-22: qty 10

## 2016-02-22 MED ORDER — HEPARIN SOD (PORK) LOCK FLUSH 100 UNIT/ML IV SOLN
500.0000 [IU] | Freq: Once | INTRAVENOUS | Status: AC | PRN
  Administered 2016-02-22: 500 [IU]
  Filled 2016-02-22: qty 5

## 2016-02-22 MED ORDER — LIDOCAINE-PRILOCAINE 2.5-2.5 % EX CREA: TOPICAL_CREAM | CUTANEOUS | 3 refills | Status: DC

## 2016-02-22 MED ORDER — ONDANSETRON HCL 8 MG PO TABS: 8.0000 mg | ORAL_TABLET | Freq: Two times a day (BID) | ORAL | 1 refills | Status: DC | PRN

## 2016-02-22 MED ORDER — SODIUM CHLORIDE 0.9 % IV SOLN
Freq: Once | INTRAVENOUS | Status: AC
  Administered 2016-02-22: 13:00:00 via INTRAVENOUS

## 2016-02-22 NOTE — Progress Notes (Signed)
Pt is approved w/ LLS to receive assistance up to $7,500 to use for ins premiums and/or qualifying treatment expenses effective 01/08/16 to 01/06/17.  Emailed copy of approval letter &POE to Lilian Coma and Elmyra Ricks in the billing dept &to HIM to scan in pt's chart.

## 2016-02-22 NOTE — Progress Notes (Signed)
START ON PATHWAY REGIMEN - Multiple Myeloma  [id number]: DaraVd (Daratumumab 16 mg/kg + Bortezomib 1.3 mg/m2 Subcut D1, 4, 8, 11 + Dexamethasone 20 mg)   A cycle is every 21 days (cycles 1-8) and every 28 days (cycle 9 and beyond):     Daratumumab (Darzalex(R)) 16 mg/kg infuse IV, see supplementary apendix for recommended infusion details.  Cycles 1-3 (q21 days): days 1, 8, 15; Cycles 4-8 (q21 days): day 1 only; Cycle 9 & beyond (q28 days): day 1 only Dose Mod: None     Bortezomib (Velcade(R)) 1.3 mg/m2 subcut twice weekly on days 1, 4, 8, & 11 of cycles 1-8 only (q21 days) Dose Mod: None     Dexamethasone (Decadron(R)) 20 mg flat dose orally or IV once on days 1, 2, 4, 5, 8, 9, 11, & 12 of cycles 1-8 only (q21 days). Note: administer IV 1 hour prior to daratumumab infusions on those days. May reduce dose to 20 mg weekly if age >47 yo,  BMI <18.5, poorly controlled DM or prior intolerance of steroid therapy. Dose Mod: None Additional Orders: Cycle length variation: cycles 1-8=q21 days, cycle 9 & beyond=q28 days. To reduce risk of delayed infusion-related rxn: give methylprednisolone 20 mg (or equivalent) PO on on the first and second days after each daratumumab infusion.  Initiate antiviral ppx 1 wk prior to & for 3 mos following daratumumab. Rec monitoring: infusion rxn, CBC w/diff, CMP, BP. Daratumumab may interfere w/ cross-matching & RBC antibody screening, see PI for details. Ref: Alesia Richards al. Alene Mires 2016.  **Always confirm dose/schedule in your pharmacy ordering system**    Patient Characteristics: Relapsed / Refractory, All Lines of Therapy R-ISS Staging: III Disease Classification: Relapsed Line of Therapy: Third Line  Intent of Therapy: Non-Curative / Palliative Intent, Discussed with Patient

## 2016-02-22 NOTE — Telephone Encounter (Signed)
Per note from [employer] is stopping Revlimid. Stop any future orders.

## 2016-02-22 NOTE — Telephone Encounter (Signed)
GAVE PATIENT AVS REPORT AND APPOINTMENTS FOR February THRU April

## 2016-02-25 DIAGNOSIS — Z7189 Other specified counseling: Secondary | ICD-10-CM | POA: Insufficient documentation

## 2016-02-25 NOTE — Assessment & Plan Note (Signed)
The patient is aware he has incurable disease and treatment is strictly palliative. We discussed importance of Advanced Directives and Living will. I will get assistance from our social worker to help him fill out some paperwork.

## 2016-02-25 NOTE — Assessment & Plan Note (Signed)
This is chronic in nature. It is stable. Recommend observation only. We'll give him a reduced dose Zometa twice a year due to chronic renal failure He can get chemotherapy as they are not dose reduce.

## 2016-02-25 NOTE — Assessment & Plan Note (Signed)
He has chronic mild peripheral neuropathy but it does not bother him.  I will continue to assess while he is receiving Velcade. 

## 2016-02-25 NOTE — Assessment & Plan Note (Signed)
Unfortunately, recent myeloma panel show progressive disease. He is getting progressively more anemic. He has stable baseline chronic renal failure I discussed with him and his wife the rationale of changing systemic treatments. We will discontinue lenalidomide. The decision was made based on recent publication on NEJM and is a category 1 recommendation in NCCN guideline Role of treatment is palliative.  Daratumumab, Bortezomib, and Dexamethasone for Multiple Myeloma Lucienne Capers, M.D., Rulon Abide, M.D., Christophe Louis, M.D., Kern Reap. Luciana Axe, M.D., Miquel Dunn, M.D., Rolanda Jay, M.D., Mickel Crow, M.D., Desma Paganini, M.D., Charmayne Sheer, M.D., Nona Dell, M.D., Docia Chuck, M.D., Magda Kiel, M.D., Martinique Schecter, M.D., [place], B.S., Brayton Caves, M.S., Rande Lawman, Ph.D., Lynetta Mare, M.D., Judie Bonus, M.D., and Lilian Kapur, M.D., for the [employer]*  [employer] [facility] [phone]. DOI: 10.1056/NEJMoa1606038  In this phase 3 trial, 498 patients with relapsed or relapsed and refractory multiple myeloma was randomized to receive bortezomib (1.3 mg per square meter of body surface area) and dexamethasone (20 mg) alone (control group) or in combination with daratumumab (16 mg per kilogram of body weight) (daratumumab group). The primary end point was progression-free survival.  RESULTS A prespecified interim analysis showed that the rate of progression-free survival was significantly higher in the daratumumab group than in the control group; the 38-monthrate of progression-free survival was 60.7% in the daratumumab group versus 26.9% in the control group. After a median follow-up period of 7.4 months, the median progression-free survival was not reached in the daratumumab group and was 7.2 months in the control group (hazard ratio for progression or death with daratumumab vs. control, 0.39; 95% confidence interval, 0.28 to 0.53; P<0.001). The rate of  overall response was higher in the daratumumab group than in the control group (82.9% vs. 63.2%, P<0.001), as were the rates of very good partial response or better (59.2% vs. 29.1%, P<0.001) and complete response or better (19.2% vs. 9.0%, P = 0.001). Three of the most common grade 3 or 4 adverse events reported in the daratumumab group and the control group were thrombocytopenia (45.3% and 32.9%, respectively), anemia (14.4% and 16.0%, respectively), and neutropenia (12.8% and 4.2%, respectively). Infusion-related reactions that were associated with daratumumab treatment were reported in 45.3% of the patients in the daratumumab group; these reactions were mostly grade 1 or 2 (grade 3 in 8.6% of the patients), and in 98.2% of these patients, they occurred during the first infusion.  Some of the short term & long term side-effects included, though not limited to, risk of fatigue, weight loss, risk of allergic reactions, pancytopenia, permanent damage to nerve function, life-threatening infections, need for transfusions of blood products, change in bowel habits, admission to hospital for various reasons, and risks of death.   The patient is aware that the response rates discussed earlier is not guaranteed.    After a long discussion, patient made an informed decision to proceed with the prescribed plan of care.  He will come here for weekly treatment and I will see him in the infusion room. I have recommended acyclovir for antimicrobial prophylaxis He will take weekly dexamethasone 2 days before coming in for treatment. We discussed the role of allopurinol for tumor lysis prophylaxis He will continue calcium with vitamin D supplements. Due to reduced kidney function, he will only get reduced dose Zometa every 6 months

## 2016-02-25 NOTE — Progress Notes (Signed)
[place] OFFICE PROGRESS NOTE  Patient Care Team: Lucianne Lei, MD as PCP - General (Family Medicine) Heath Lark, MD as Consulting Physician (Hematology and Oncology) Verlin Grills, RN as [place] Management  SUMMARY OF ONCOLOGIC HISTORY:   Multiple myeloma in relapse ([place])   12/12/2010 Initial Diagnosis    Multiple myeloma      02/16/2014 Imaging    Skeletal survey showed diffuse osteopenia      03/02/2014 Bone Marrow Biopsy    Accession: [id number] BM biopsy showed only 5 % plasma cells. However, the biopsy was difficult and the bone was fragmented during the procedure. Cytogenetics and FISH study is normal      03/03/2014 Procedure    he has port placement      03/10/2014 - 05/19/2014 Chemotherapy    he received elotuzumab and revlimid      05/20/2014 -  Chemotherapy    Treatment is switched to maintenance Revlimid only      11/15/2014 Adverse Reaction    He has mild worsening anemia. Does of Revlimid reduced to 5 mg 21 days on, 7 days off       INTERVAL HISTORY: Please see below for problem oriented charting. He returns today with his wife. We discussed test results. He does not feel dizzy or tired with anemia.  The patient is quite sedentary at home. No recent infection of bone pain. The patient denies any recent signs or symptoms of bleeding such as spontaneous epistaxis, hematuria or hematochezia.   REVIEW OF SYSTEMS:   Constitutional: Denies fevers, chills or abnormal weight loss Eyes: Denies blurriness of vision Ears, nose, mouth, throat, and face: Denies mucositis or sore throat Respiratory: Denies cough, dyspnea or wheezes Cardiovascular: Denies palpitation, chest discomfort or lower extremity swelling Gastrointestinal:  Denies nausea, heartburn or change in bowel habits Skin: Denies abnormal skin rashes Lymphatics: Denies new lymphadenopathy or easy bruising Neurological:Denies numbness, tingling or new  weaknesses Behavioral/Psych: Mood is stable, no new changes  All other systems were reviewed with the patient and are negative.  I have reviewed the past medical history, past surgical history, social history and family history with the patient and they are unchanged from previous note.  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  MEDICATIONS:  Current Outpatient Prescriptions  Medication Sig Dispense Refill  . acyclovir (ZOVIRAX) 400 MG tablet Take 1 tablet (400 mg total) by mouth daily. 60 tablet 11  . allopurinol (ZYLOPRIM) 100 MG tablet Take 100 mg by mouth 2 (two) times daily.     Marland Kitchen amoxicillin-clavulanate (AUGMENTIN) 875-125 MG tablet Take 1 tablet by mouth every 12 (twelve) hours. 6 tablet 0  . aspirin EC 81 MG tablet Take 81 mg by mouth daily.    Marland Kitchen atorvastatin (LIPITOR) 20 MG tablet Take 20 mg by mouth at bedtime.     . brimonidine (ALPHAGAN) 0.15 % ophthalmic solution Place 1 drop into the right eye 3 (three) times daily.    . cholecalciferol (VITAMIN D) 1000 UNITS tablet Take 1,000 Units by mouth daily.    Marland Kitchen dexamethasone (DECADRON) 4 MG tablet Take 5 tablets every Mondays 20 tablet 4  . dorzolamide-timolol (COSOPT) 22.3-6.8 MG/ML ophthalmic solution Place 1 drop into both eyes 2 (two) times daily.     . fluorometholone (FML) 0.1 % ophthalmic suspension Place 1 drop into both eyes daily.    Marland Kitchen latanoprost (XALATAN) 0.005 % ophthalmic solution Place 1 drop into the right eye at bedtime.    Marland Kitchen  lidocaine-prilocaine (EMLA) cream Apply 1 application topically as needed. 30 g 1  . lidocaine-prilocaine (EMLA) cream Apply to affected area once 30 g 3  . loperamide (IMODIUM A-D) 2 MG tablet 1 po after each watery BM.  Maximum 6 per 24hrs. 30 tablet PRN  . ondansetron (ZOFRAN) 8 MG tablet Take 1 tablet (8 mg total) by mouth 2 (two) times daily as needed (Nausea or vomiting). 30 tablet 1  . prochlorperazine (COMPAZINE) 10 MG tablet Take 1 tablet (10 mg total) by mouth every 6 (six)  hours as needed (Nausea or vomiting). 30 tablet 1   No current facility-administered medications for this visit.     PHYSICAL EXAMINATION: ECOG PERFORMANCE STATUS: 1 - Symptomatic but completely ambulatory  Vitals:   02/22/16 1209  BP: (!) 150/59  Pulse: 83  Resp: 18  Temp: 98.6 F (37 C)   Filed Weights   02/22/16 1209  Weight: 173 lb 4.8 oz (78.6 kg)    GENERAL:alert, no distress and comfortable SKIN: skin color, texture, turgor are normal, no rashes or significant lesions EYES: normal, Conjunctiva are pink and non-injected, sclera clear OROPHARYNX:no exudate, no erythema and lips, buccal mucosa, and tongue normal  NECK: supple, thyroid normal size, non-tender, without nodularity LYMPH:  no palpable lymphadenopathy in the cervical, axillary or inguinal LUNGS: clear to auscultation and percussion with normal breathing effort HEART: regular rate & rhythm and no murmurs and no lower extremity edema ABDOMEN:abdomen soft, non-tender and normal bowel sounds Musculoskeletal:no cyanosis of digits and no clubbing  NEURO: alert & oriented x 3 with fluent speech, no focal motor/sensory deficits  LABORATORY DATA:  I have reviewed the data as listed    Component Value Date/Time   NA 136 02/22/2016 1233   K 4.1 02/22/2016 1233   CL 108 04/10/2015 0405   CL 107 06/29/2012 1131   CO2 23 02/22/2016 1233   GLUCOSE 113 02/22/2016 1233   GLUCOSE 143 (H) 06/29/2012 1131   BUN 26.3 (H) 02/22/2016 1233   CREATININE 2.1 (H) 02/22/2016 1233   CALCIUM 8.8 02/22/2016 1233   PROT 7.4 02/22/2016 1233   ALBUMIN 3.6 02/22/2016 1233   AST 21 02/22/2016 1233   ALT 20 02/22/2016 1233   ALKPHOS 58 02/22/2016 1233   BILITOT 0.58 02/22/2016 1233   GFRNONAA 38 (L) 04/10/2015 0405   GFRAA 44 (L) 04/10/2015 0405    No results found for: SPEP, UPEP  Lab Results  Component Value Date   WBC 4.1 02/22/2016   NEUTROABS 1.7 02/22/2016   HGB 8.6 (L) 02/22/2016   HCT 26.2 (L) 02/22/2016   MCV 88.2  02/22/2016   PLT 176 02/22/2016      Chemistry      Component Value Date/Time   NA 136 02/22/2016 1233   K 4.1 02/22/2016 1233   CL 108 04/10/2015 0405   CL 107 06/29/2012 1131   CO2 23 02/22/2016 1233   BUN 26.3 (H) 02/22/2016 1233   CREATININE 2.1 (H) 02/22/2016 1233      Component Value Date/Time   CALCIUM 8.8 02/22/2016 1233   ALKPHOS 58 02/22/2016 1233   AST 21 02/22/2016 1233   ALT 20 02/22/2016 1233   BILITOT 0.58 02/22/2016 1233      ASSESSMENT & PLAN:  Multiple myeloma in relapse ([place]) Unfortunately, recent myeloma panel show progressive disease. He is getting progressively more anemic. He has stable baseline chronic renal failure I discussed with him and his wife the rationale of changing systemic treatments. We will discontinue  lenalidomide. The decision was made based on recent publication on NEJM and is a category 1 recommendation in NCCN guideline Role of treatment is palliative.  Daratumumab, Bortezomib, and Dexamethasone for Multiple Myeloma Lucienne Capers, M.D., Rulon Abide, M.D., Christophe Louis, M.D., Kern Reap. Luciana Axe, M.D., Miquel Dunn, M.D., Rolanda Jay, M.D., Mickel Crow, M.D., Desma Paganini, M.D., Charmayne Sheer, M.D., Nona Dell, M.D., Docia Chuck, M.D., Magda Kiel, M.D., Martinique Schecter, M.D., [place], B.S., Brayton Caves, M.S., Rande Lawman, Ph.D., Lynetta Mare, M.D., Judie Bonus, M.D., and Lilian Kapur, M.D., for the [facility]*  [employer] [facility] [phone]. DOI: 10.1056/[id number]  In this phase 3 trial, 498 patients with relapsed or relapsed and refractory multiple myeloma was randomized to receive bortezomib (1.3 mg per square meter of body surface area) and dexamethasone (20 mg) alone (control group) or in combination with daratumumab (16 mg per kilogram of body weight) (daratumumab group). The primary end point was progression-free survival.  RESULTS A prespecified interim analysis showed that the rate  of progression-free survival was significantly higher in the daratumumab group than in the control group; the 12-monthrate of progression-free survival was 60.7% in the daratumumab group versus 26.9% in the control group. After a median follow-up period of 7.4 months, the median progression-free survival was not reached in the daratumumab group and was 7.2 months in the control group (hazard ratio for progression or death with daratumumab vs. control, 0.39; 95% confidence interval, 0.28 to 0.53; P<0.001). The rate of overall response was higher in the daratumumab group than in the control group (82.9% vs. 63.2%, P<0.001), as were the rates of very good partial response or better (59.2% vs. 29.1%, P<0.001) and complete response or better (19.2% vs. 9.0%, P = 0.001). Three of the most common grade 3 or 4 adverse events reported in the daratumumab group and the control group were thrombocytopenia (45.3% and 32.9%, respectively), anemia (14.4% and 16.0%, respectively), and neutropenia (12.8% and 4.2%, respectively). Infusion-related reactions that were associated with daratumumab treatment were reported in 45.3% of the patients in the daratumumab group; these reactions were mostly grade 1 or 2 (grade 3 in 8.6% of the patients), and in 98.2% of these patients, they occurred during the first infusion.  Some of the short term & long term side-effects included, though not limited to, risk of fatigue, weight loss, risk of allergic reactions, pancytopenia, permanent damage to nerve function, life-threatening infections, need for transfusions of blood products, change in bowel habits, admission to hospital for various reasons, and risks of death.   The patient is aware that the response rates discussed earlier is not guaranteed.    After a long discussion, patient made an informed decision to proceed with the prescribed plan of care.  He will come here for weekly treatment and I will see him in the infusion room. I  have recommended acyclovir for antimicrobial prophylaxis He will take weekly dexamethasone 2 days before coming in for treatment. We discussed the role of allopurinol for tumor lysis prophylaxis He will continue calcium with vitamin D supplements. Due to reduced kidney function, he will only get reduced dose Zometa every 6 months     Anemia in chronic renal disease He is noted to have progressive anemia, likely due to progressive bone marrow disease, on top of his chronic renal failure. I will add additional workup and he will be given additional blood transfusion if needed, if hemoglobin dropped to less than 8 g  Chronic renal insufficiency, stage III (moderate) This  is chronic in nature. It is stable. Recommend observation only. We'll give him a reduced dose Zometa twice a year due to chronic renal failure He can get chemotherapy as they are not dose reduce.   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  I will continue to assess while he is receiving Velcade.  Goals of care, counseling/discussion The patient is aware he has incurable disease and treatment is strictly palliative. We discussed importance of Advanced Directives and Living will. I will get assistance from our social worker to help him fill out some paperwork.   Orders Placed This Encounter  Procedures  . CBC with Differential    Standing Status:   Standing    Number of Occurrences:   20    Standing Expiration Date:   02/22/2017  . Comprehensive metabolic panel    Standing Status:   Standing    Number of Occurrences:   20    Standing Expiration Date:   02/22/2017  . Type and screen    Standing Status:   Future    Number of Occurrences:   1    Standing Expiration Date:   02/21/2017  . Hold Tube, Blood Bank    Standing Status:   Standing    Number of Occurrences:   3    Standing Expiration Date:   02/24/2017   All questions were answered.  The patient knows to call the clinic with any problems, questions or concerns. No barriers to learning was detected. I spent 55 minutes counseling the patient face to face. The total time spent in the appointment was 80 minutes and more than 50% was on counseling and review of test results     Heath Lark, MD 02/25/2016 4:58 AM

## 2016-02-25 NOTE — Assessment & Plan Note (Signed)
He is noted to have progressive anemia, likely due to progressive bone marrow disease, on top of his chronic renal failure. I will add additional workup and he will be given additional blood transfusion if needed, if hemoglobin dropped to less than 8 g

## 2016-03-05 ENCOUNTER — Other Ambulatory Visit: Payer: Self-pay | Admitting: Hematology and Oncology

## 2016-03-06 ENCOUNTER — Other Ambulatory Visit (HOSPITAL_BASED_OUTPATIENT_CLINIC_OR_DEPARTMENT_OTHER): Payer: Medicare Other

## 2016-03-06 ENCOUNTER — Ambulatory Visit (HOSPITAL_BASED_OUTPATIENT_CLINIC_OR_DEPARTMENT_OTHER): Payer: Medicare Other

## 2016-03-06 VITALS — BP 126/58 | HR 56 | Temp 97.5°F | Resp 16

## 2016-03-06 DIAGNOSIS — N183 Chronic kidney disease, stage 3 (moderate): Secondary | ICD-10-CM

## 2016-03-06 DIAGNOSIS — C9001 Multiple myeloma in remission: Secondary | ICD-10-CM | POA: Diagnosis not present

## 2016-03-06 DIAGNOSIS — Z5112 Encounter for antineoplastic immunotherapy: Secondary | ICD-10-CM

## 2016-03-06 DIAGNOSIS — C9002 Multiple myeloma in relapse: Secondary | ICD-10-CM

## 2016-03-06 DIAGNOSIS — D631 Anemia in chronic kidney disease: Secondary | ICD-10-CM

## 2016-03-06 LAB — COMPREHENSIVE METABOLIC PANEL
ALT: 19 U/L (ref 0–55)
AST: 17 U/L (ref 5–34)
Albumin: 3.9 g/dL (ref 3.5–5.0)
Alkaline Phosphatase: 50 U/L (ref 40–150)
Anion Gap: 9 mEq/L (ref 3–11)
BUN: 40.1 mg/dL — AB (ref 7.0–26.0)
CHLORIDE: 106 meq/L (ref 98–109)
CO2: 21 meq/L — AB (ref 22–29)
Calcium: 9 mg/dL (ref 8.4–10.4)
Creatinine: 2.8 mg/dL — ABNORMAL HIGH (ref 0.7–1.3)
EGFR: 25 mL/min/{1.73_m2} — ABNORMAL LOW (ref >90)
GLUCOSE: 115 mg/dL (ref 70–140)
POTASSIUM: 4.6 meq/L (ref 3.5–5.1)
SODIUM: 136 meq/L (ref 136–145)
Total Bilirubin: 0.61 mg/dL (ref 0.20–1.20)
Total Protein: 8 g/dL (ref 6.4–8.3)

## 2016-03-06 LAB — CBC WITH DIFFERENTIAL/PLATELET
BASO%: 0 % (ref 0.0–2.0)
BASOS ABS: 0 10*3/uL (ref 0.0–0.1)
EOS%: 0.1 % (ref 0.0–7.0)
Eosinophils Absolute: 0 10*3/uL (ref 0.0–0.5)
HCT: 27.1 % — ABNORMAL LOW (ref 38.4–49.9)
HEMOGLOBIN: 8.9 g/dL — AB (ref 13.0–17.1)
LYMPH%: 13 % — ABNORMAL LOW (ref 14.0–49.0)
MCH: 29 pg (ref 27.2–33.4)
MCHC: 32.8 g/dL (ref 32.0–36.0)
MCV: 88.3 fL (ref 79.3–98.0)
MONO#: 1.1 10*3/uL — ABNORMAL HIGH (ref 0.1–0.9)
MONO%: 12.1 % (ref 0.0–14.0)
NEUT#: 6.9 10*3/uL — ABNORMAL HIGH (ref 1.5–6.5)
NEUT%: 74.8 % (ref 39.0–75.0)
Platelets: 181 10*3/uL (ref 140–400)
RBC: 3.07 10*6/uL — AB (ref 4.20–5.82)
RDW: 17 % — AB (ref 11.0–14.6)
WBC: 9.2 10*3/uL (ref 4.0–10.3)
lymph#: 1.2 10*3/uL (ref 0.9–3.3)

## 2016-03-06 MED ORDER — SODIUM CHLORIDE 0.9% FLUSH
10.0000 mL | INTRAVENOUS | Status: DC | PRN
  Administered 2016-03-06: 10 mL
  Filled 2016-03-06: qty 10

## 2016-03-06 MED ORDER — MONTELUKAST SODIUM 10 MG PO TABS
ORAL_TABLET | ORAL | Status: AC
  Filled 2016-03-06: qty 1

## 2016-03-06 MED ORDER — BORTEZOMIB CHEMO SQ INJECTION 3.5 MG (2.5MG/ML)
1.3000 mg/m2 | Freq: Once | INTRAMUSCULAR | Status: AC
  Administered 2016-03-06: 2.5 mg via SUBCUTANEOUS
  Filled 2016-03-06: qty 2.5

## 2016-03-06 MED ORDER — PROCHLORPERAZINE MALEATE 10 MG PO TABS
ORAL_TABLET | ORAL | Status: AC
  Filled 2016-03-06: qty 1

## 2016-03-06 MED ORDER — METHYLPREDNISOLONE SODIUM SUCC 125 MG IJ SOLR
125.0000 mg | Freq: Once | INTRAMUSCULAR | Status: AC
  Administered 2016-03-06: 125 mg via INTRAVENOUS

## 2016-03-06 MED ORDER — DIPHENHYDRAMINE HCL 25 MG PO CAPS
ORAL_CAPSULE | ORAL | Status: AC
  Filled 2016-03-06: qty 2

## 2016-03-06 MED ORDER — METHYLPREDNISOLONE SODIUM SUCC 125 MG IJ SOLR
INTRAMUSCULAR | Status: AC
  Filled 2016-03-06: qty 2

## 2016-03-06 MED ORDER — SODIUM CHLORIDE 0.9 % IV SOLN
Freq: Once | INTRAVENOUS | Status: AC
  Administered 2016-03-06: 08:00:00 via INTRAVENOUS

## 2016-03-06 MED ORDER — ACETAMINOPHEN 325 MG PO TABS
ORAL_TABLET | ORAL | Status: AC
  Filled 2016-03-06: qty 2

## 2016-03-06 MED ORDER — HEPARIN SOD (PORK) LOCK FLUSH 100 UNIT/ML IV SOLN
500.0000 [IU] | Freq: Once | INTRAVENOUS | Status: AC | PRN
  Administered 2016-03-06: 500 [IU]
  Filled 2016-03-06: qty 5

## 2016-03-06 MED ORDER — DIPHENHYDRAMINE HCL 25 MG PO CAPS
50.0000 mg | ORAL_CAPSULE | Freq: Once | ORAL | Status: AC
  Administered 2016-03-06: 50 mg via ORAL

## 2016-03-06 MED ORDER — SODIUM CHLORIDE 0.9 % IV SOLN
15.3000 mg/kg | Freq: Once | INTRAVENOUS | Status: AC
  Administered 2016-03-06: 1200 mg via INTRAVENOUS
  Filled 2016-03-06: qty 60

## 2016-03-06 MED ORDER — MONTELUKAST SODIUM 10 MG PO TABS
10.0000 mg | ORAL_TABLET | Freq: Once | ORAL | Status: AC
  Administered 2016-03-06: 10 mg via ORAL

## 2016-03-06 MED ORDER — ACETAMINOPHEN 325 MG PO TABS
650.0000 mg | ORAL_TABLET | Freq: Once | ORAL | Status: AC
  Administered 2016-03-06: 650 mg via ORAL

## 2016-03-06 MED ORDER — PROCHLORPERAZINE MALEATE 10 MG PO TABS
10.0000 mg | ORAL_TABLET | Freq: Once | ORAL | Status: AC
  Administered 2016-03-06: 10 mg via ORAL

## 2016-03-06 NOTE — Progress Notes (Signed)
Patient tolerated treatment well. Patient and vital signs stable upon discharge.  

## 2016-03-06 NOTE — Progress Notes (Signed)
Okay to treat with creatinine of 2.8 today, per Dr. Alvy Bimler.

## 2016-03-06 NOTE — Patient Instructions (Addendum)
[facility] Discharge Instructions for Patients Receiving Chemotherapy  Today you received the following chemotherapy agents Darzalex and Velcade   To help prevent nausea and vomiting after your treatment, we encourage you to take your nausea medication as directed.    If you develop nausea and vomiting that is not controlled by your nausea medication, call the clinic.   BELOW ARE SYMPTOMS THAT SHOULD BE REPORTED IMMEDIATELY:  *FEVER GREATER THAN 100.5 F  *CHILLS WITH OR WITHOUT FEVER  NAUSEA AND VOMITING THAT IS NOT CONTROLLED WITH YOUR NAUSEA MEDICATION  *UNUSUAL SHORTNESS OF BREATH  *UNUSUAL BRUISING OR BLEEDING  TENDERNESS IN MOUTH AND THROAT WITH OR WITHOUT PRESENCE OF ULCERS  *URINARY PROBLEMS  *BOWEL PROBLEMS  UNUSUAL RASH Items with * indicate a potential emergency and should be followed up as soon as possible.  Feel free to call the clinic you have any questions or concerns. The clinic phone number is [phone].  Please show the CHEMO ALERT CARD at check-in to the Emergency Department and triage nurse.  Daratumumab injection What is this medicine? DARATUMUMAB (dar a toom ue mab) is a monoclonal antibody. It is used to treat multiple myeloma. This medicine may be used for other purposes; ask your health care provider or pharmacist if you have questions. COMMON BRAND NAME(S): DARZALEX What should I tell my health care provider before I take this medicine? They need to know if you have any of these conditions: -infection (especially a virus infection such as chickenpox, cold sores, or herpes) -lung or breathing disease -pregnant or trying to get pregnant -breast-feeding -an unusual or allergic reaction to daratumumab, other medicines, foods, dyes, or preservatives How should I use this medicine? This medicine is for infusion into a vein. It is given by a health care professional in a hospital or clinic setting. Talk to your pediatrician  regarding the use of this medicine in children. Special care may be needed. Overdosage: If you think you have taken too much of this medicine contact a poison control center or emergency room at once. NOTE: This medicine is only for you. Do not share this medicine with others. What if I miss a dose? Keep appointments for follow-up doses as directed. It is important not to miss your dose. Call your doctor or health care professional if you are unable to keep an appointment. What may interact with this medicine? Interactions have not been studied. Give your health care provider a list of all the medicines, herbs, non-prescription drugs, or dietary supplements you use. Also tell them if you smoke, drink alcohol, or use illegal drugs. Some items may interact with your medicine. This list may not describe all possible interactions. Give your health care provider a list of all the medicines, herbs, non-prescription drugs, or dietary supplements you use. Also tell them if you smoke, drink alcohol, or use illegal drugs. Some items may interact with your medicine. What should I watch for while using this medicine? This drug may make you feel generally unwell. Report any side effects. Continue your course of treatment even though you feel ill unless your doctor tells you to stop. This medicine can cause serious allergic reactions. To reduce your risk you may need to take medicine before treatment with this medicine. Take your medicine as directed. This medicine can affect the results of blood tests to match your blood type. These changes can last for up to 6 months after the final dose. Your healthcare provider will do blood tests to match   your blood type before you start treatment. Tell all of your healthcare providers that you are being treated with this medicine before receiving a blood transfusion. This medicine can affect the results of some tests used to determine treatment response; extra tests may be  needed to evaluate response. Do not become pregnant while taking this medicine or for 3 months after stopping it. Women should inform their doctor if they wish to become pregnant or think they might be pregnant. There is a potential for serious side effects to an unborn child. Talk to your health care professional or pharmacist for more information. What side effects may I notice from receiving this medicine? Side effects that you should report to your doctor or health care professional as soon as possible: -allergic reactions like skin rash, itching or hives, swelling of the face, lips, or tongue -breathing problems -chills -cough -dizziness -feeling faint or lightheaded -headache -low blood counts - this medicine may decrease the number of white blood cells, red blood cells and platelets. You may be at increased risk for infections and bleeding. -nausea, vomiting -shortness of breath -signs of decreased platelets or bleeding - bruising, pinpoint red spots on the skin, black, tarry stools, blood in the urine -signs of decreased red blood cells - unusually weak or tired, feeling faint or lightheaded, falls -signs of infection - fever or chills, cough, sore throat, pain or difficulty passing urine Side effects that usually do not require medical attention (report to your doctor or health care professional if they continue or are bothersome): -back pain -diarrhea -muscle cramps -pain, tingling, numbness in the hands or feet -swelling of the ankles, feet, hands -tiredness This list may not describe all possible side effects. Call your doctor for medical advice about side effects. You may report side effects to FDA at 1-800-FDA-1088. Where should I keep my medicine? Keep out of the reach of children. This drug is given in a hospital or clinic and will not be stored at home. NOTE: This sheet is a summary. It may not cover all possible information. If you have questions about this medicine,  talk to your doctor, pharmacist, or health care provider.  2018 Elsevier/Gold Standard (2015-01-26 10:38:11)  Bortezomib injection What is this medicine? BORTEZOMIB (bor TEZ oh mib) is a medicine that targets proteins in cancer cells and stops the cancer cells from growing. It is used to treat multiple myeloma and mantle-cell lymphoma. This medicine may be used for other purposes; ask your health care provider or pharmacist if you have questions. COMMON BRAND NAME(S): Velcade What should I tell my health care provider before I take this medicine? They need to know if you have any of these conditions: -diabetes -heart disease -irregular heartbeat -liver disease -on hemodialysis -low blood counts, like low white blood cells, platelets, or hemoglobin -peripheral neuropathy -taking medicine for blood pressure -an unusual or allergic reaction to bortezomib, mannitol, boron, other medicines, foods, dyes, or preservatives -pregnant or trying to get pregnant -breast-feeding How should I use this medicine? This medicine is for injection into a vein or for injection under the skin. It is given by a health care professional in a hospital or clinic setting. Talk to your pediatrician regarding the use of this medicine in children. Special care may be needed. Overdosage: If you think you have taken too much of this medicine contact a poison control center or emergency room at once. NOTE: This medicine is only for you. Do not share this medicine with others. What if  I miss a dose? It is important not to miss your dose. Call your doctor or health care professional if you are unable to keep an appointment. What may interact with this medicine? This medicine may interact with the following medications: -ketoconazole -rifampin -ritonavir -St. John's Wort This list may not describe all possible interactions. Give your health care provider a list of all the medicines, herbs, non-prescription drugs, or  dietary supplements you use. Also tell them if you smoke, drink alcohol, or use illegal drugs. Some items may interact with your medicine. What should I watch for while using this medicine? You may get drowsy or dizzy. Do not drive, use machinery, or do anything that needs mental alertness until you know how this medicine affects you. Do not stand or sit up quickly, especially if you are an older patient. This reduces the risk of dizzy or fainting spells. In some cases, you may be given additional medicines to help with side effects. Follow all directions for their use. Call your doctor or health care professional for advice if you get a fever, chills or sore throat, or other symptoms of a cold or flu. Do not treat yourself. This drug decreases your body's ability to fight infections. Try to avoid being around people who are sick. This medicine may increase your risk to bruise or bleed. Call your doctor or health care professional if you notice any unusual bleeding. You may need blood work done while you are taking this medicine. In some patients, this medicine may cause a serious brain infection that may cause death. If you have any problems seeing, thinking, speaking, walking, or standing, tell your doctor right away. If you cannot reach your doctor, urgently seek other source of medical care. Check with your doctor or health care professional if you get an attack of severe diarrhea, nausea and vomiting, or if you sweat a lot. The loss of too much body fluid can make it dangerous for you to take this medicine. Do not become pregnant while taking this medicine or for at least 2 months after stopping it. Women should inform their doctor if they wish to become pregnant or think they might be pregnant. Men should not father a child while taking this medicine and for at least 2 months after stopping it. There is a potential for serious side effects to an unborn child. Talk to your health care professional or  pharmacist for more information. Do not breast-feed an infant while taking this medicine or for 2 months after stopping it. This medicine may interfere with the ability to have a child. You should talk with your doctor or health care professional if you are concerned about your fertility. What side effects may I notice from receiving this medicine? Side effects that you should report to your doctor or health care professional as soon as possible: -allergic reactions like skin rash, itching or hives, swelling of the face, lips, or tongue -breathing problems -changes in hearing -changes in vision -fast, irregular heartbeat -feeling faint or lightheaded, falls -pain, tingling, numbness in the hands or feet -right upper belly pain -seizures -swelling of the ankles, feet, hands -unusual bleeding or bruising -unusually weak or tired -vomiting -yellowing of the eyes or skin Side effects that usually do not require medical attention (report to your doctor or health care professional if they continue or are bothersome): -changes in emotions or moods -constipation -diarrhea -loss of appetite -headache -irritation at site where injected -nausea This list may not describe all  possible side effects. Call your doctor for medical advice about side effects. You may report side effects to FDA at 1-800-FDA-1088. Where should I keep my medicine? This drug is given in a hospital or clinic and will not be stored at home. NOTE: This sheet is a summary. It may not cover all possible information. If you have questions about this medicine, talk to your doctor, pharmacist, or health care provider.  2018 Elsevier/Gold Standard (2015-11-23 15:53:51)

## 2016-03-07 ENCOUNTER — Telehealth: Payer: Self-pay | Admitting: Medical Oncology

## 2016-03-07 DIAGNOSIS — N189 Chronic kidney disease, unspecified: Secondary | ICD-10-CM | POA: Diagnosis not present

## 2016-03-07 DIAGNOSIS — C9001 Multiple myeloma in remission: Secondary | ICD-10-CM | POA: Diagnosis not present

## 2016-03-07 DIAGNOSIS — C61 Malignant neoplasm of prostate: Secondary | ICD-10-CM | POA: Diagnosis not present

## 2016-03-07 NOTE — Telephone Encounter (Signed)
I left message to return call and update on how he is doing after treatment yesterday.I told him to call back  to triage nurse or  Dr Alvy Bimler nurse

## 2016-03-08 NOTE — Telephone Encounter (Signed)
Jamerson J Eckley returned call for chemotherapy F/U.  Patient is doing well.  Denies n/v.  Denies any new side effects or symptoms.  Bowel and bladder is functioning well.  Eating and drinking well and I instructed to drink 64 oz minimum daily or at least the day before, of and after treatment.  Denies questions at this time and encouraged to call if needed.  Reviewed how to call after hours in the case of an emergency.  Denies any questions at this time.

## 2016-03-13 ENCOUNTER — Ambulatory Visit (HOSPITAL_BASED_OUTPATIENT_CLINIC_OR_DEPARTMENT_OTHER): Payer: Medicare Other | Admitting: Hematology and Oncology

## 2016-03-13 ENCOUNTER — Ambulatory Visit (HOSPITAL_BASED_OUTPATIENT_CLINIC_OR_DEPARTMENT_OTHER): Payer: Medicare Other

## 2016-03-13 ENCOUNTER — Other Ambulatory Visit (HOSPITAL_BASED_OUTPATIENT_CLINIC_OR_DEPARTMENT_OTHER): Payer: Medicare Other

## 2016-03-13 ENCOUNTER — Other Ambulatory Visit: Payer: Self-pay | Admitting: Hematology and Oncology

## 2016-03-13 ENCOUNTER — Encounter: Payer: Self-pay | Admitting: Hematology and Oncology

## 2016-03-13 VITALS — BP 111/57 | HR 55 | Temp 97.7°F | Resp 18

## 2016-03-13 DIAGNOSIS — N183 Chronic kidney disease, stage 3 unspecified: Secondary | ICD-10-CM

## 2016-03-13 DIAGNOSIS — D631 Anemia in chronic kidney disease: Secondary | ICD-10-CM

## 2016-03-13 DIAGNOSIS — C9002 Multiple myeloma in relapse: Secondary | ICD-10-CM | POA: Diagnosis not present

## 2016-03-13 DIAGNOSIS — T451X5A Adverse effect of antineoplastic and immunosuppressive drugs, initial encounter: Secondary | ICD-10-CM

## 2016-03-13 DIAGNOSIS — C9001 Multiple myeloma in remission: Secondary | ICD-10-CM

## 2016-03-13 DIAGNOSIS — G620 Drug-induced polyneuropathy: Secondary | ICD-10-CM

## 2016-03-13 DIAGNOSIS — Z5112 Encounter for antineoplastic immunotherapy: Secondary | ICD-10-CM

## 2016-03-13 LAB — COMPREHENSIVE METABOLIC PANEL
ALBUMIN: 3.4 g/dL — AB (ref 3.5–5.0)
ALK PHOS: 58 U/L (ref 40–150)
ALT: 40 U/L (ref 0–55)
AST: 19 U/L (ref 5–34)
Anion Gap: 10 mEq/L (ref 3–11)
BILIRUBIN TOTAL: 0.43 mg/dL (ref 0.20–1.20)
BUN: 45.7 mg/dL — ABNORMAL HIGH (ref 7.0–26.0)
CO2: 20 mEq/L — ABNORMAL LOW (ref 22–29)
CREATININE: 2.3 mg/dL — AB (ref 0.7–1.3)
Calcium: 9 mg/dL (ref 8.4–10.4)
Chloride: 103 mEq/L (ref 98–109)
EGFR: 32 mL/min/{1.73_m2} — AB (ref >90)
GLUCOSE: 123 mg/dL (ref 70–140)
Potassium: 4.2 mEq/L (ref 3.5–5.1)
SODIUM: 133 meq/L — AB (ref 136–145)
TOTAL PROTEIN: 7 g/dL (ref 6.4–8.3)

## 2016-03-13 LAB — CBC WITH DIFFERENTIAL/PLATELET
BASO%: 0 % (ref 0.0–2.0)
Basophils Absolute: 0 10*3/uL (ref 0.0–0.1)
EOS ABS: 0 10*3/uL (ref 0.0–0.5)
EOS%: 0 % (ref 0.0–7.0)
HCT: 26.4 % — ABNORMAL LOW (ref 38.4–49.9)
HEMOGLOBIN: 8.8 g/dL — AB (ref 13.0–17.1)
LYMPH%: 12.8 % — ABNORMAL LOW (ref 14.0–49.0)
MCH: 29.2 pg (ref 27.2–33.4)
MCHC: 33.3 g/dL (ref 32.0–36.0)
MCV: 87.7 fL (ref 79.3–98.0)
MONO#: 2 10*3/uL — AB (ref 0.1–0.9)
MONO%: 21.4 % — AB (ref 0.0–14.0)
NEUT#: 6.1 10*3/uL (ref 1.5–6.5)
NEUT%: 65.8 % (ref 39.0–75.0)
PLATELETS: 168 10*3/uL (ref 140–400)
RBC: 3.01 10*6/uL — ABNORMAL LOW (ref 4.20–5.82)
RDW: 17.1 % — AB (ref 11.0–14.6)
WBC: 9.2 10*3/uL (ref 4.0–10.3)
lymph#: 1.2 10*3/uL (ref 0.9–3.3)

## 2016-03-13 MED ORDER — DIPHENHYDRAMINE HCL 25 MG PO CAPS
ORAL_CAPSULE | ORAL | Status: AC
  Filled 2016-03-13: qty 2

## 2016-03-13 MED ORDER — ACETAMINOPHEN 325 MG PO TABS
ORAL_TABLET | ORAL | Status: AC
  Filled 2016-03-13: qty 2

## 2016-03-13 MED ORDER — SODIUM CHLORIDE 0.9 % IV SOLN
Freq: Once | INTRAVENOUS | Status: AC
  Administered 2016-03-13: 08:00:00 via INTRAVENOUS

## 2016-03-13 MED ORDER — SODIUM CHLORIDE 0.9% FLUSH
10.0000 mL | INTRAVENOUS | Status: DC | PRN
  Administered 2016-03-13: 10 mL
  Filled 2016-03-13: qty 10

## 2016-03-13 MED ORDER — ACETAMINOPHEN 325 MG PO TABS
650.0000 mg | ORAL_TABLET | Freq: Once | ORAL | Status: AC
  Administered 2016-03-13: 650 mg via ORAL

## 2016-03-13 MED ORDER — MONTELUKAST SODIUM 10 MG PO TABS
ORAL_TABLET | ORAL | Status: AC
  Filled 2016-03-13: qty 1

## 2016-03-13 MED ORDER — MONTELUKAST SODIUM 10 MG PO TABS
10.0000 mg | ORAL_TABLET | Freq: Once | ORAL | Status: AC
  Administered 2016-03-13: 10 mg via ORAL

## 2016-03-13 MED ORDER — SODIUM CHLORIDE 0.9 % IV SOLN
15.3000 mg/kg | Freq: Once | INTRAVENOUS | Status: AC
  Administered 2016-03-13: 1200 mg via INTRAVENOUS
  Filled 2016-03-13: qty 60

## 2016-03-13 MED ORDER — METHYLPREDNISOLONE SODIUM SUCC 125 MG IJ SOLR
125.0000 mg | Freq: Once | INTRAMUSCULAR | Status: AC
  Administered 2016-03-13: 125 mg via INTRAVENOUS

## 2016-03-13 MED ORDER — DIPHENHYDRAMINE HCL 25 MG PO CAPS
50.0000 mg | ORAL_CAPSULE | Freq: Once | ORAL | Status: AC
  Administered 2016-03-13: 50 mg via ORAL

## 2016-03-13 MED ORDER — PROCHLORPERAZINE MALEATE 10 MG PO TABS
10.0000 mg | ORAL_TABLET | Freq: Once | ORAL | Status: AC
  Administered 2016-03-13: 10 mg via ORAL

## 2016-03-13 MED ORDER — HEPARIN SOD (PORK) LOCK FLUSH 100 UNIT/ML IV SOLN
500.0000 [IU] | Freq: Once | INTRAVENOUS | Status: AC | PRN
  Administered 2016-03-13: 500 [IU]
  Filled 2016-03-13: qty 5

## 2016-03-13 MED ORDER — PROCHLORPERAZINE MALEATE 10 MG PO TABS
ORAL_TABLET | ORAL | Status: AC
  Filled 2016-03-13: qty 1

## 2016-03-13 MED ORDER — BORTEZOMIB CHEMO SQ INJECTION 3.5 MG (2.5MG/ML)
1.3000 mg/m2 | Freq: Once | INTRAMUSCULAR | Status: AC
  Administered 2016-03-13: 2.5 mg via SUBCUTANEOUS
  Filled 2016-03-13: qty 2.5

## 2016-03-13 MED ORDER — METHYLPREDNISOLONE SODIUM SUCC 125 MG IJ SOLR
INTRAMUSCULAR | Status: AC
  Filled 2016-03-13: qty 2

## 2016-03-13 MED ORDER — PROCHLORPERAZINE MALEATE 10 MG PO TABS: 10.0000 mg | ORAL_TABLET | Freq: Once | ORAL | Status: DC

## 2016-03-13 NOTE — Assessment & Plan Note (Signed)
This is chronic in nature. It is stable. Recommend observation only. We'll give him a reduced dose Zometa twice a year due to chronic renal failure He does not require dose adjustment for his chemotherapy.  

## 2016-03-13 NOTE — Progress Notes (Signed)
[facility] OFFICE PROGRESS NOTE  Patient Care Team: Lucianne Lei, MD as PCP - General (Family Medicine) Heath Lark, MD as Consulting Physician (Hematology and Oncology) Verlin Grills, RN as [place] Management  SUMMARY OF ONCOLOGIC HISTORY:   Multiple myeloma in relapse ([place])   12/12/2010 Initial Diagnosis    Multiple myeloma      02/16/2014 Imaging    Skeletal survey showed diffuse osteopenia      03/02/2014 Bone Marrow Biopsy    Accession: [id number] BM biopsy showed only 5 % plasma cells. However, the biopsy was difficult and the bone was fragmented during the procedure. Cytogenetics and FISH study is normal      03/03/2014 Procedure    he has port placement      03/10/2014 - 05/19/2014 Chemotherapy    he received elotuzumab and revlimid      05/20/2014 - 02/25/2016 Chemotherapy    Treatment is switched to maintenance Revlimid only. Treatment is stopped due to progressive disease      11/15/2014 Adverse Reaction    He has mild worsening anemia. Does of Revlimid reduced to 5 mg 21 days on, 7 days off      03/06/2016 -  Chemotherapy    He received Daratumumab and Velcade       INTERVAL HISTORY: Please see below for problem oriented charting. He is seen in the treatment room.  He is receiving cycle 2 of chemotherapy. He tolerated treatment well. Denies new bone pain.  No worsening neuropathy. No recent nausea. The patient denies any recent signs or symptoms of bleeding such as spontaneous epistaxis, hematuria or hematochezia.  REVIEW OF SYSTEMS:   Constitutional: Denies fevers, chills or abnormal weight loss Eyes: Denies blurriness of vision Ears, nose, mouth, throat, and face: Denies mucositis or sore throat Respiratory: Denies cough, dyspnea or wheezes Cardiovascular: Denies palpitation, chest discomfort or lower extremity swelling Gastrointestinal:  Denies nausea, heartburn or change in bowel habits Skin: Denies abnormal skin  rashes Lymphatics: Denies new lymphadenopathy or easy bruising Neurological:Denies numbness, tingling or new weaknesses Behavioral/Psych: Mood is stable, no new changes  All other systems were reviewed with the patient and are negative.  I have reviewed the past medical history, past surgical history, social history and family history with the patient and they are unchanged from previous note.  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  MEDICATIONS:  Current Outpatient Prescriptions  Medication Sig Dispense Refill  . acyclovir (ZOVIRAX) 400 MG tablet Take 1 tablet (400 mg total) by mouth daily. 60 tablet 11  . allopurinol (ZYLOPRIM) 100 MG tablet Take 100 mg by mouth 2 (two) times daily.     [place] [facility] amoxicillin-clavulanate (AUGMENTIN) 875-125 MG tablet Take 1 tablet by mouth every 12 (twelve) hours. 6 tablet 0  . aspirin EC 81 MG tablet Take 81 mg by mouth daily.    [place] [facility] atorvastatin (LIPITOR) 20 MG tablet Take 20 mg by mouth at bedtime.     . brimonidine (ALPHAGAN) 0.15 % ophthalmic solution Place 1 drop into the right eye 3 (three) times daily.    . cholecalciferol (VITAMIN D) 1000 UNITS tablet Take 1,000 Units by mouth daily.    [place] [facility] dexamethasone (DECADRON) 4 MG tablet Take 5 tablets every Mondays 20 tablet 4  . dorzolamide-timolol (COSOPT) 22.3-6.8 MG/ML ophthalmic solution Place 1 drop into both eyes 2 (two) times daily.     . fluorometholone (FML) 0.1 % ophthalmic suspension Place 1 drop into both eyes daily.    [place] [facility]  latanoprost (XALATAN) 0.005 % ophthalmic solution Place 1 drop into the right eye at bedtime.    . lidocaine-prilocaine (EMLA) cream Apply 1 application topically as needed. 30 g 1  . lidocaine-prilocaine (EMLA) cream Apply to affected area once 30 g 3  . loperamide (IMODIUM A-D) 2 MG tablet 1 po after each watery BM.  Maximum 6 per 24hrs. 30 tablet PRN  . ondansetron (ZOFRAN) 8 MG tablet Take 1 tablet (8 mg total) by mouth 2 (two) times daily as needed (Nausea or  vomiting). 30 tablet 1  . prochlorperazine (COMPAZINE) 10 MG tablet Take 1 tablet (10 mg total) by mouth every 6 (six) hours as needed (Nausea or vomiting). 30 tablet 1   No current facility-administered medications for this visit.    Facility-Administered Medications Ordered in Other Visits  Medication Dose Route Frequency Provider Last Rate Last Dose  . sodium chloride flush (NS) 0.9 % injection 10 mL  10 mL Intracatheter PRN Heath Lark, MD   10 mL at 03/13/16 1429    PHYSICAL EXAMINATION: ECOG PERFORMANCE STATUS: 1 - Symptomatic but completely ambulatory  Vitals:   There were no vitals filed for this visit.  GENERAL:alert, no distress and comfortable SKIN: skin color, texture, turgor are normal, no rashes or significant lesions EYES: normal, Conjunctiva are pink and non-injected, sclera clear OROPHARYNX:no exudate, no erythema and lips, buccal mucosa, and tongue normal  NECK: supple, thyroid normal size, non-tender, without nodularity LYMPH:  no palpable lymphadenopathy in the cervical, axillary or inguinal LUNGS: clear to auscultation and percussion with normal breathing effort HEART: regular rate & rhythm and no murmurs and no lower extremity edema ABDOMEN:abdomen soft, non-tender and normal bowel sounds Musculoskeletal:no cyanosis of digits and no clubbing  NEURO: alert & oriented x 3 with fluent speech, no focal motor/sensory deficits  LABORATORY DATA:  I have reviewed the data as listed    Component Value Date/Time   NA 133 (L) 03/13/2016 0745   K 4.2 03/13/2016 0745   CL 108 04/10/2015 0405   CL 107 06/29/2012 1131   CO2 20 (L) 03/13/2016 0745   GLUCOSE 123 03/13/2016 0745   GLUCOSE 143 (H) 06/29/2012 1131   BUN 45.7 (H) 03/13/2016 0745   CREATININE 2.3 (H) 03/13/2016 0745   CALCIUM 9.0 03/13/2016 0745   PROT 7.0 03/13/2016 0745   ALBUMIN 3.4 (L) 03/13/2016 0745   AST 19 03/13/2016 0745   ALT 40 03/13/2016 0745   ALKPHOS 58 03/13/2016 0745   BILITOT 0.43  03/13/2016 0745   GFRNONAA 38 (L) 04/10/2015 0405   GFRAA 44 (L) 04/10/2015 0405    No results found for: SPEP, UPEP  Lab Results  Component Value Date   WBC 9.2 03/13/2016   NEUTROABS 6.1 03/13/2016   HGB 8.8 (L) 03/13/2016   HCT 26.4 (L) 03/13/2016   MCV 87.7 03/13/2016   PLT 168 03/13/2016      Chemistry      Component Value Date/Time   NA 133 (L) 03/13/2016 0745   K 4.2 03/13/2016 0745   CL 108 04/10/2015 0405   CL 107 06/29/2012 1131   CO2 20 (L) 03/13/2016 0745   BUN 45.7 (H) 03/13/2016 0745   CREATININE 2.3 (H) 03/13/2016 0745      Component Value Date/Time   CALCIUM 9.0 03/13/2016 0745   ALKPHOS 58 03/13/2016 0745   AST 19 03/13/2016 0745   ALT 40 03/13/2016 0745   BILITOT 0.43 03/13/2016 0745       ASSESSMENT & PLAN:  Multiple myeloma in relapse [facility]) He tolerated chemotherapy well. He has anemia but remained asymptomatic. Denies bone pain. No peripheral neuropathy. We will continue treatment without dose adjustment. I plan to repeat myeloma panel after 4 doses of treatment. I will continue to see him every other week. He will continue acyclovir for antimicrobial prophylaxis. He will take calcium, vitamin D and will receive Zometa every 6 months, next due around August 2018.  Anemia in chronic renal disease He is noted to have progressive anemia, likely due to progressive bone marrow disease, on top of his chronic renal failure. He will be given additional blood transfusion if needed, if hemoglobin dropped to less than 8 g  Chronic renal insufficiency, stage III (moderate) This is chronic in nature. It is stable. Recommend observation only. We'll give him a reduced dose Zometa twice a year due to chronic renal failure He does not require dose adjustment for his chemotherapy.   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  I will continue to assess while he is receiving  Velcade.   Orders Placed This Encounter  Procedures  . Kappa/lambda light chains    Standing Status:   Future    Standing Expiration Date:   04/17/2017  . Multiple Myeloma Panel (SPEP&IFE w/QIG)    Standing Status:   Future    Standing Expiration Date:   04/17/2017   All questions were answered. The patient knows to call the clinic with any problems, questions or concerns. No barriers to learning was detected. I spent 20 minutes counseling the patient face to face. The total time spent in the appointment was 30 minutes and more than 50% was on counseling and review of test results     Heath Lark, MD 03/13/2016 4:09 PM

## 2016-03-13 NOTE — Patient Instructions (Addendum)
[facility] Discharge Instructions for Patients Receiving Chemotherapy  Today you received the following chemotherapy agents Darazalex/ Velcade To help prevent nausea and vomiting after your treatment, we encourage you to take your nausea medication as prescribed.   If you develop nausea and vomiting that is not controlled by your nausea medication, call the clinic.   BELOW ARE SYMPTOMS THAT SHOULD BE REPORTED IMMEDIATELY:  *FEVER GREATER THAN 100.5 F  *CHILLS WITH OR WITHOUT FEVER  NAUSEA AND VOMITING THAT IS NOT CONTROLLED WITH YOUR NAUSEA MEDICATION  *UNUSUAL SHORTNESS OF BREATH  *UNUSUAL BRUISING OR BLEEDING  TENDERNESS IN MOUTH AND THROAT WITH OR WITHOUT PRESENCE OF ULCERS  *URINARY PROBLEMS  *BOWEL PROBLEMS  UNUSUAL RASH Items with * indicate a potential emergency and should be followed up as soon as possible.  Feel free to call the clinic you have any questions or concerns. The clinic phone number is [phone].  Please show the [place] at check-in to the Emergency Department and triage nurse.

## 2016-03-13 NOTE — Assessment & Plan Note (Signed)
He has chronic mild peripheral neuropathy but it does not bother him.  I will continue to assess while he is receiving Velcade.

## 2016-03-13 NOTE — Assessment & Plan Note (Signed)
He is noted to have progressive anemia, likely due to progressive bone marrow disease, on top of his chronic renal failure. He will be given additional blood transfusion if needed, if hemoglobin dropped to less than 8 g

## 2016-03-13 NOTE — Assessment & Plan Note (Addendum)
He tolerated chemotherapy well. He has anemia but remained asymptomatic. Denies bone pain. No peripheral neuropathy. We will continue treatment without dose adjustment. I plan to repeat myeloma panel after 4 doses of treatment. I will continue to see him every other week. He will continue acyclovir for antimicrobial prophylaxis and weekly pulsed dexamethasone on Mondays He will take calcium, vitamin D and will receive Zometa every 6 months, next due around August 2018.

## 2016-03-20 ENCOUNTER — Other Ambulatory Visit (HOSPITAL_BASED_OUTPATIENT_CLINIC_OR_DEPARTMENT_OTHER): Payer: Medicare Other

## 2016-03-20 ENCOUNTER — Ambulatory Visit (HOSPITAL_BASED_OUTPATIENT_CLINIC_OR_DEPARTMENT_OTHER): Payer: Medicare Other

## 2016-03-20 ENCOUNTER — Ambulatory Visit: Payer: Medicare Other

## 2016-03-20 ENCOUNTER — Other Ambulatory Visit: Payer: Self-pay | Admitting: Hematology and Oncology

## 2016-03-20 VITALS — BP 121/54 | HR 61 | Temp 97.8°F | Resp 17

## 2016-03-20 DIAGNOSIS — Z5112 Encounter for antineoplastic immunotherapy: Secondary | ICD-10-CM | POA: Diagnosis not present

## 2016-03-20 DIAGNOSIS — D631 Anemia in chronic kidney disease: Secondary | ICD-10-CM

## 2016-03-20 DIAGNOSIS — N183 Chronic kidney disease, stage 3 unspecified: Secondary | ICD-10-CM

## 2016-03-20 DIAGNOSIS — C9001 Multiple myeloma in remission: Secondary | ICD-10-CM

## 2016-03-20 DIAGNOSIS — C9002 Multiple myeloma in relapse: Secondary | ICD-10-CM | POA: Diagnosis not present

## 2016-03-20 DIAGNOSIS — Z95828 Presence of other vascular implants and grafts: Secondary | ICD-10-CM

## 2016-03-20 LAB — CBC WITH DIFFERENTIAL/PLATELET
BASO%: 0 % (ref 0.0–2.0)
Basophils Absolute: 0 10*3/uL (ref 0.0–0.1)
EOS%: 0 % (ref 0.0–7.0)
Eosinophils Absolute: 0 10*3/uL (ref 0.0–0.5)
HCT: 27.2 % — ABNORMAL LOW (ref 38.4–49.9)
HEMOGLOBIN: 9.1 g/dL — AB (ref 13.0–17.1)
LYMPH%: 11.4 % — AB (ref 14.0–49.0)
MCH: 29.2 pg (ref 27.2–33.4)
MCHC: 33.5 g/dL (ref 32.0–36.0)
MCV: 87.2 fL (ref 79.3–98.0)
MONO#: 0.6 10*3/uL (ref 0.1–0.9)
MONO%: 4.9 % (ref 0.0–14.0)
NEUT%: 83.7 % — ABNORMAL HIGH (ref 39.0–75.0)
NEUTROS ABS: 9.9 10*3/uL — AB (ref 1.5–6.5)
Platelets: 145 10*3/uL (ref 140–400)
RBC: 3.12 10*6/uL — AB (ref 4.20–5.82)
RDW: 17.5 % — ABNORMAL HIGH (ref 11.0–14.6)
WBC: 11.9 10*3/uL — AB (ref 4.0–10.3)
lymph#: 1.4 10*3/uL (ref 0.9–3.3)

## 2016-03-20 LAB — COMPREHENSIVE METABOLIC PANEL
ALT: 59 U/L — ABNORMAL HIGH (ref 0–55)
AST: 18 U/L (ref 5–34)
Albumin: 3.3 g/dL — ABNORMAL LOW (ref 3.5–5.0)
Alkaline Phosphatase: 68 U/L (ref 40–150)
Anion Gap: 8 mEq/L (ref 3–11)
BUN: 38.2 mg/dL — ABNORMAL HIGH (ref 7.0–26.0)
CO2: 21 mEq/L — ABNORMAL LOW (ref 22–29)
Calcium: 9 mg/dL (ref 8.4–10.4)
Chloride: 103 mEq/L (ref 98–109)
Creatinine: 2.3 mg/dL — ABNORMAL HIGH (ref 0.7–1.3)
EGFR: 31 mL/min/{1.73_m2} — AB (ref >90)
Glucose: 116 mg/dl (ref 70–140)
POTASSIUM: 4.3 meq/L (ref 3.5–5.1)
SODIUM: 132 meq/L — AB (ref 136–145)
Total Bilirubin: 0.48 mg/dL (ref 0.20–1.20)
Total Protein: 6.8 g/dL (ref 6.4–8.3)

## 2016-03-20 MED ORDER — PROCHLORPERAZINE MALEATE 10 MG PO TABS
ORAL_TABLET | ORAL | Status: AC
  Filled 2016-03-20: qty 1

## 2016-03-20 MED ORDER — METHYLPREDNISOLONE SODIUM SUCC 125 MG IJ SOLR
125.0000 mg | Freq: Once | INTRAMUSCULAR | Status: AC
  Administered 2016-03-20: 125 mg via INTRAVENOUS

## 2016-03-20 MED ORDER — SODIUM CHLORIDE 0.9 % IV SOLN
15.3000 mg/kg | Freq: Once | INTRAVENOUS | Status: AC
  Administered 2016-03-20: 1200 mg via INTRAVENOUS
  Filled 2016-03-20: qty 60

## 2016-03-20 MED ORDER — DIPHENHYDRAMINE HCL 25 MG PO CAPS
ORAL_CAPSULE | ORAL | Status: AC
  Filled 2016-03-20: qty 2

## 2016-03-20 MED ORDER — SODIUM CHLORIDE 0.9 % IV SOLN
Freq: Once | INTRAVENOUS | Status: AC
  Administered 2016-03-20: 09:00:00 via INTRAVENOUS

## 2016-03-20 MED ORDER — SODIUM CHLORIDE 0.9% FLUSH
10.0000 mL | INTRAVENOUS | Status: DC | PRN
  Administered 2016-03-20: 10 mL
  Filled 2016-03-20: qty 10

## 2016-03-20 MED ORDER — DIPHENHYDRAMINE HCL 25 MG PO CAPS
50.0000 mg | ORAL_CAPSULE | Freq: Once | ORAL | Status: AC
  Administered 2016-03-20: 50 mg via ORAL

## 2016-03-20 MED ORDER — METHYLPREDNISOLONE SODIUM SUCC 125 MG IJ SOLR
INTRAMUSCULAR | Status: AC
  Filled 2016-03-20: qty 2

## 2016-03-20 MED ORDER — PROCHLORPERAZINE MALEATE 10 MG PO TABS
10.0000 mg | ORAL_TABLET | Freq: Once | ORAL | Status: AC
  Administered 2016-03-20: 10 mg via ORAL

## 2016-03-20 MED ORDER — BORTEZOMIB CHEMO SQ INJECTION 3.5 MG (2.5MG/ML)
1.3000 mg/m2 | Freq: Once | INTRAMUSCULAR | Status: AC
  Administered 2016-03-20: 2.5 mg via SUBCUTANEOUS
  Filled 2016-03-20: qty 2.5

## 2016-03-20 MED ORDER — SODIUM CHLORIDE 0.9 % IJ SOLN
10.0000 mL | Freq: Once | INTRAMUSCULAR | Status: AC
  Administered 2016-03-20: 10 mL
  Filled 2016-03-20: qty 10

## 2016-03-20 MED ORDER — HEPARIN SOD (PORK) LOCK FLUSH 100 UNIT/ML IV SOLN
500.0000 [IU] | Freq: Once | INTRAVENOUS | Status: AC | PRN
  Administered 2016-03-20: 500 [IU]
  Filled 2016-03-20: qty 5

## 2016-03-20 MED ORDER — ACETAMINOPHEN 325 MG PO TABS
ORAL_TABLET | ORAL | Status: AC
  Filled 2016-03-20: qty 2

## 2016-03-20 MED ORDER — MONTELUKAST SODIUM 10 MG PO TABS
ORAL_TABLET | ORAL | Status: AC
  Filled 2016-03-20: qty 1

## 2016-03-20 MED ORDER — MONTELUKAST SODIUM 10 MG PO TABS
10.0000 mg | ORAL_TABLET | Freq: Once | ORAL | Status: AC
  Administered 2016-03-20: 10 mg via ORAL

## 2016-03-20 MED ORDER — ACETAMINOPHEN 325 MG PO TABS
650.0000 mg | ORAL_TABLET | Freq: Once | ORAL | Status: AC
  Administered 2016-03-20: 650 mg via ORAL

## 2016-03-20 NOTE — Patient Instructions (Signed)
Implanted Port Home Guide An implanted port is a type of central line that is placed under the skin. Central lines are used to provide IV access when treatment or nutrition needs to be given through a person's veins. Implanted ports are used for long-term IV access. An implanted port may be placed because:  You need IV medicine that would be irritating to the small veins in your hands or arms.  You need long-term IV medicines, such as antibiotics.  You need IV nutrition for a long period.  You need frequent blood draws for lab tests.  You need dialysis.  Implanted ports are usually placed in the chest area, but they can also be placed in the upper arm, the abdomen, or the leg. An implanted port has two main parts:  Reservoir. The reservoir is round and will appear as a small, raised area under your skin. The reservoir is the part where a needle is inserted to give medicines or draw blood.  Catheter. The catheter is a thin, flexible tube that extends from the reservoir. The catheter is placed into a large vein. Medicine that is inserted into the reservoir goes into the catheter and then into the vein.  How will I care for my incision site? Do not get the incision site wet. Bathe or shower as directed by your health care provider. How is my port accessed? Special steps must be taken to access the port:  Before the port is accessed, a numbing cream can be placed on the skin. This helps numb the skin over the port site.  Your health care provider uses a sterile technique to access the port. ? Your health care provider must put on a mask and sterile gloves. ? The skin over your port is cleaned carefully with an antiseptic and allowed to dry. ? The port is gently pinched between sterile gloves, and a needle is inserted into the port.  Only "non-coring" port needles should be used to access the port. Once the port is accessed, a blood return should be checked. This helps ensure that the port  is in the vein and is not clogged.  If your port needs to remain accessed for a constant infusion, a clear (transparent) bandage will be placed over the needle site. The bandage and needle will need to be changed every week, or as directed by your health care provider.  Keep the bandage covering the needle clean and dry. Do not get it wet. Follow your health care provider's instructions on how to take a shower or bath while the port is accessed.  If your port does not need to stay accessed, no bandage is needed over the port.  What is flushing? Flushing helps keep the port from getting clogged. Follow your health care provider's instructions on how and when to flush the port. Ports are usually flushed with saline solution or a medicine called heparin. The need for flushing will depend on how the port is used.  If the port is used for intermittent medicines or blood draws, the port will need to be flushed: ? After medicines have been given. ? After blood has been drawn. ? As part of routine maintenance.  If a constant infusion is running, the port may not need to be flushed.  How long will my port stay implanted? The port can stay in for as long as your health care provider thinks it is needed. When it is time for the port to come out, surgery will be   done to remove it. The procedure is similar to the one performed when the port was put in. When should I seek immediate medical care? When you have an implanted port, you should seek immediate medical care if:  You notice a bad smell coming from the incision site.  You have swelling, redness, or drainage at the incision site.  You have more swelling or pain at the port site or the surrounding area.  You have a fever that is not controlled with medicine.  This information is not intended to replace advice given to you by your health care provider. Make sure you discuss any questions you have with your health care provider. Document  Released: 12/24/2004 Document Revised: 06/01/2015 Document Reviewed: 08/31/2012 [employer] Interactive Patient Education  2017 [employer]  

## 2016-03-20 NOTE — Patient Instructions (Signed)
[facility] Discharge Instructions for Patients Receiving Chemotherapy  Today you received the following chemotherapy agents Darzalex and Velcade   To help prevent nausea and vomiting after your treatment, we encourage you to take your nausea medication as directed.    If you develop nausea and vomiting that is not controlled by your nausea medication, call the clinic.   BELOW ARE SYMPTOMS THAT SHOULD BE REPORTED IMMEDIATELY:  *FEVER GREATER THAN 100.5 F  *CHILLS WITH OR WITHOUT FEVER  NAUSEA AND VOMITING THAT IS NOT CONTROLLED WITH YOUR NAUSEA MEDICATION  *UNUSUAL SHORTNESS OF BREATH  *UNUSUAL BRUISING OR BLEEDING  TENDERNESS IN MOUTH AND THROAT WITH OR WITHOUT PRESENCE OF ULCERS  *URINARY PROBLEMS  *BOWEL PROBLEMS  UNUSUAL RASH Items with * indicate a potential emergency and should be followed up as soon as possible.  Feel free to call the clinic you have any questions or concerns. The clinic phone number is [phone].  Please show the [facility] at check-in to the Emergency Department and triage nurse.  Daratumumab injection What is this medicine? DARATUMUMAB (dar a toom ue mab) is a monoclonal antibody. It is used to treat multiple myeloma. This medicine may be used for other purposes; ask your health care provider or pharmacist if you have questions. COMMON BRAND NAME(S): DARZALEX What should I tell my health care provider before I take this medicine? They need to know if you have any of these conditions: -infection (especially a virus infection such as chickenpox, cold sores, or herpes) -lung or breathing disease -pregnant or trying to get pregnant -breast-feeding -an unusual or allergic reaction to daratumumab, other medicines, foods, dyes, or preservatives How should I use this medicine? This medicine is for infusion into a vein. It is given by a health care professional in a hospital or clinic setting. Talk to your pediatrician  regarding the use of this medicine in children. Special care may be needed. Overdosage: If you think you have taken too much of this medicine contact a poison control center or emergency room at once. NOTE: This medicine is only for you. Do not share this medicine with others. What if I miss a dose? Keep appointments for follow-up doses as directed. It is important not to miss your dose. Call your doctor or health care professional if you are unable to keep an appointment. What may interact with this medicine? Interactions have not been studied. Give your health care provider a list of all the medicines, herbs, non-prescription drugs, or dietary supplements you use. Also tell them if you smoke, drink alcohol, or use illegal drugs. Some items may interact with your medicine. This list may not describe all possible interactions. Give your health care provider a list of all the medicines, herbs, non-prescription drugs, or dietary supplements you use. Also tell them if you smoke, drink alcohol, or use illegal drugs. Some items may interact with your medicine. What should I watch for while using this medicine? This drug may make you feel generally unwell. Report any side effects. Continue your course of treatment even though you feel ill unless your doctor tells you to stop. This medicine can cause serious allergic reactions. To reduce your risk you may need to take medicine before treatment with this medicine. Take your medicine as directed. This medicine can affect the results of blood tests to match your blood type. These changes can last for up to 6 months after the final dose. Your healthcare provider will do blood tests to match  your blood type before you start treatment. Tell all of your healthcare providers that you are being treated with this medicine before receiving a blood transfusion. This medicine can affect the results of some tests used to determine treatment response; extra tests may be  needed to evaluate response. Do not become pregnant while taking this medicine or for 3 months after stopping it. Women should inform their doctor if they wish to become pregnant or think they might be pregnant. There is a potential for serious side effects to an unborn child. Talk to your health care professional or pharmacist for more information. What side effects may I notice from receiving this medicine? Side effects that you should report to your doctor or health care professional as soon as possible: -allergic reactions like skin rash, itching or hives, swelling of the face, lips, or tongue -breathing problems -chills -cough -dizziness -feeling faint or lightheaded -headache -low blood counts - this medicine may decrease the number of white blood cells, red blood cells and platelets. You may be at increased risk for infections and bleeding. -nausea, vomiting -shortness of breath -signs of decreased platelets or bleeding - bruising, pinpoint red spots on the skin, black, tarry stools, blood in the urine -signs of decreased red blood cells - unusually weak or tired, feeling faint or lightheaded, falls -signs of infection - fever or chills, cough, sore throat, pain or difficulty passing urine Side effects that usually do not require medical attention (report to your doctor or health care professional if they continue or are bothersome): -back pain -diarrhea -muscle cramps -pain, tingling, numbness in the hands or feet -swelling of the ankles, feet, hands -tiredness This list may not describe all possible side effects. Call your doctor for medical advice about side effects. You may report side effects to FDA at 1-800-FDA-1088. Where should I keep my medicine? Keep out of the reach of children. This drug is given in a hospital or clinic and will not be stored at home. NOTE: This sheet is a summary. It may not cover all possible information. If you have questions about this medicine,  talk to your doctor, pharmacist, or health care provider.  2018 Elsevier/Gold Standard (2015-01-26 10:38:11)  Bortezomib injection What is this medicine? BORTEZOMIB (bor TEZ oh mib) is a medicine that targets proteins in cancer cells and stops the cancer cells from growing. It is used to treat multiple myeloma and mantle-cell lymphoma. This medicine may be used for other purposes; ask your health care provider or pharmacist if you have questions. COMMON BRAND NAME(S): Velcade What should I tell my health care provider before I take this medicine? They need to know if you have any of these conditions: -diabetes -heart disease -irregular heartbeat -liver disease -on hemodialysis -low blood counts, like low white blood cells, platelets, or hemoglobin -peripheral neuropathy -taking medicine for blood pressure -an unusual or allergic reaction to bortezomib, mannitol, boron, other medicines, foods, dyes, or preservatives -pregnant or trying to get pregnant -breast-feeding How should I use this medicine? This medicine is for injection into a vein or for injection under the skin. It is given by a health care professional in a hospital or clinic setting. Talk to your pediatrician regarding the use of this medicine in children. Special care may be needed. Overdosage: If you think you have taken too much of this medicine contact a poison control center or emergency room at once. NOTE: This medicine is only for you. Do not share this medicine with others. What if  I miss a dose? It is important not to miss your dose. Call your doctor or health care professional if you are unable to keep an appointment. What may interact with this medicine? This medicine may interact with the following medications: -ketoconazole -rifampin -ritonavir -St. John's Wort This list may not describe all possible interactions. Give your health care provider a list of all the medicines, herbs, non-prescription drugs, or  dietary supplements you use. Also tell them if you smoke, drink alcohol, or use illegal drugs. Some items may interact with your medicine. What should I watch for while using this medicine? You may get drowsy or dizzy. Do not drive, use machinery, or do anything that needs mental alertness until you know how this medicine affects you. Do not stand or sit up quickly, especially if you are an older patient. This reduces the risk of dizzy or fainting spells. In some cases, you may be given additional medicines to help with side effects. Follow all directions for their use. Call your doctor or health care professional for advice if you get a fever, chills or sore throat, or other symptoms of a cold or flu. Do not treat yourself. This drug decreases your body's ability to fight infections. Try to avoid being around people who are sick. This medicine may increase your risk to bruise or bleed. Call your doctor or health care professional if you notice any unusual bleeding. You may need blood work done while you are taking this medicine. In some patients, this medicine may cause a serious brain infection that may cause death. If you have any problems seeing, thinking, speaking, walking, or standing, tell your doctor right away. If you cannot reach your doctor, urgently seek other source of medical care. Check with your doctor or health care professional if you get an attack of severe diarrhea, nausea and vomiting, or if you sweat a lot. The loss of too much body fluid can make it dangerous for you to take this medicine. Do not become pregnant while taking this medicine or for at least 2 months after stopping it. Women should inform their doctor if they wish to become pregnant or think they might be pregnant. Men should not father a child while taking this medicine and for at least 2 months after stopping it. There is a potential for serious side effects to an unborn child. Talk to your health care professional or  pharmacist for more information. Do not breast-feed an infant while taking this medicine or for 2 months after stopping it. This medicine may interfere with the ability to have a child. You should talk with your doctor or health care professional if you are concerned about your fertility. What side effects may I notice from receiving this medicine? Side effects that you should report to your doctor or health care professional as soon as possible: -allergic reactions like skin rash, itching or hives, swelling of the face, lips, or tongue -breathing problems -changes in hearing -changes in vision -fast, irregular heartbeat -feeling faint or lightheaded, falls -pain, tingling, numbness in the hands or feet -right upper belly pain -seizures -swelling of the ankles, feet, hands -unusual bleeding or bruising -unusually weak or tired -vomiting -yellowing of the eyes or skin Side effects that usually do not require medical attention (report to your doctor or health care professional if they continue or are bothersome): -changes in emotions or moods -constipation -diarrhea -loss of appetite -headache -irritation at site where injected -nausea This list may not describe all  possible side effects. Call your doctor for medical advice about side effects. You may report side effects to FDA at 1-800-FDA-1088. Where should I keep my medicine? This drug is given in a hospital or clinic and will not be stored at home. NOTE: This sheet is a summary. It may not cover all possible information. If you have questions about this medicine, talk to your doctor, pharmacist, or health care provider.  2018 Elsevier/Gold Standard (2015-11-23 15:53:51)

## 2016-03-25 DIAGNOSIS — H4051X2 Glaucoma secondary to other eye disorders, right eye, moderate stage: Secondary | ICD-10-CM | POA: Diagnosis not present

## 2016-03-25 DIAGNOSIS — Z961 Presence of intraocular lens: Secondary | ICD-10-CM | POA: Diagnosis not present

## 2016-03-27 ENCOUNTER — Ambulatory Visit (HOSPITAL_BASED_OUTPATIENT_CLINIC_OR_DEPARTMENT_OTHER): Payer: Medicare Other

## 2016-03-27 ENCOUNTER — Ambulatory Visit (HOSPITAL_BASED_OUTPATIENT_CLINIC_OR_DEPARTMENT_OTHER): Payer: Medicare Other | Admitting: Hematology and Oncology

## 2016-03-27 ENCOUNTER — Encounter: Payer: Self-pay | Admitting: Hematology and Oncology

## 2016-03-27 ENCOUNTER — Ambulatory Visit: Payer: Medicare Other

## 2016-03-27 VITALS — BP 110/64 | HR 51 | Temp 97.6°F | Resp 16

## 2016-03-27 DIAGNOSIS — N183 Chronic kidney disease, stage 3 unspecified: Secondary | ICD-10-CM

## 2016-03-27 DIAGNOSIS — Z5112 Encounter for antineoplastic immunotherapy: Secondary | ICD-10-CM

## 2016-03-27 DIAGNOSIS — C9001 Multiple myeloma in remission: Secondary | ICD-10-CM

## 2016-03-27 DIAGNOSIS — C9002 Multiple myeloma in relapse: Secondary | ICD-10-CM

## 2016-03-27 DIAGNOSIS — Z95828 Presence of other vascular implants and grafts: Secondary | ICD-10-CM

## 2016-03-27 DIAGNOSIS — D61818 Other pancytopenia: Secondary | ICD-10-CM

## 2016-03-27 LAB — CBC WITH DIFFERENTIAL/PLATELET
BASO%: 0.2 % (ref 0.0–2.0)
BASOS ABS: 0 10*3/uL (ref 0.0–0.1)
EOS ABS: 0 10*3/uL (ref 0.0–0.5)
EOS%: 0 % (ref 0.0–7.0)
HCT: 27.1 % — ABNORMAL LOW (ref 38.4–49.9)
HEMOGLOBIN: 8.9 g/dL — AB (ref 13.0–17.1)
LYMPH%: 14.2 % (ref 14.0–49.0)
MCH: 29.6 pg (ref 27.2–33.4)
MCHC: 32.9 g/dL (ref 32.0–36.0)
MCV: 90.1 fL (ref 79.3–98.0)
MONO#: 0.7 10*3/uL (ref 0.1–0.9)
MONO%: 5.7 % (ref 0.0–14.0)
NEUT#: 9.7 10*3/uL — ABNORMAL HIGH (ref 1.5–6.5)
NEUT%: 79.9 % — ABNORMAL HIGH (ref 39.0–75.0)
PLATELETS: 97 10*3/uL — AB (ref 140–400)
RBC: 3.01 10*6/uL — ABNORMAL LOW (ref 4.20–5.82)
RDW: 20.3 % — AB (ref 11.0–14.6)
WBC: 12.1 10*3/uL — ABNORMAL HIGH (ref 4.0–10.3)
lymph#: 1.7 10*3/uL (ref 0.9–3.3)

## 2016-03-27 LAB — COMPREHENSIVE METABOLIC PANEL
ALBUMIN: 3.3 g/dL — AB (ref 3.5–5.0)
ALK PHOS: 63 U/L (ref 40–150)
ALT: 57 U/L — ABNORMAL HIGH (ref 0–55)
ANION GAP: 8 meq/L (ref 3–11)
AST: 23 U/L (ref 5–34)
BUN: 34.6 mg/dL — ABNORMAL HIGH (ref 7.0–26.0)
CALCIUM: 8.6 mg/dL (ref 8.4–10.4)
CO2: 23 mEq/L (ref 22–29)
Chloride: 103 mEq/L (ref 98–109)
Creatinine: 2.3 mg/dL — ABNORMAL HIGH (ref 0.7–1.3)
EGFR: 32 mL/min/{1.73_m2} — AB (ref >90)
GLUCOSE: 102 mg/dL (ref 70–140)
POTASSIUM: 4.5 meq/L (ref 3.5–5.1)
SODIUM: 135 meq/L — AB (ref 136–145)
TOTAL PROTEIN: 6.6 g/dL (ref 6.4–8.3)
Total Bilirubin: 0.59 mg/dL (ref 0.20–1.20)

## 2016-03-27 MED ORDER — SODIUM CHLORIDE 0.9% FLUSH
10.0000 mL | INTRAVENOUS | Status: DC | PRN
  Administered 2016-03-27: 10 mL
  Filled 2016-03-27: qty 10

## 2016-03-27 MED ORDER — PROCHLORPERAZINE MALEATE 10 MG PO TABS: 10.0000 mg | ORAL_TABLET | Freq: Once | ORAL | Status: AC

## 2016-03-27 MED ORDER — ACETAMINOPHEN 325 MG PO TABS
ORAL_TABLET | ORAL | Status: AC
  Filled 2016-03-27: qty 2

## 2016-03-27 MED ORDER — DIPHENHYDRAMINE HCL 25 MG PO CAPS
50.0000 mg | ORAL_CAPSULE | Freq: Once | ORAL | Status: AC
  Administered 2016-03-27: 50 mg via ORAL

## 2016-03-27 MED ORDER — METHYLPREDNISOLONE SODIUM SUCC 125 MG IJ SOLR
125.0000 mg | Freq: Once | INTRAMUSCULAR | Status: AC
Start: 2016-03-27 — End: 2016-03-27
  Administered 2016-03-27: 125 mg via INTRAVENOUS

## 2016-03-27 MED ORDER — DIPHENHYDRAMINE HCL 25 MG PO CAPS
ORAL_CAPSULE | ORAL | Status: AC
  Filled 2016-03-27: qty 2

## 2016-03-27 MED ORDER — PROCHLORPERAZINE MALEATE 10 MG PO TABS
10.0000 mg | ORAL_TABLET | Freq: Once | ORAL | Status: AC
  Administered 2016-03-27: 10 mg via ORAL

## 2016-03-27 MED ORDER — BORTEZOMIB CHEMO SQ INJECTION 3.5 MG (2.5MG/ML)
1.3000 mg/m2 | Freq: Once | INTRAMUSCULAR | Status: AC
  Administered 2016-03-27: 2.5 mg via SUBCUTANEOUS
  Filled 2016-03-27: qty 2.5

## 2016-03-27 MED ORDER — ACETAMINOPHEN 325 MG PO TABS
650.0000 mg | ORAL_TABLET | Freq: Once | ORAL | Status: AC
Start: 2016-03-27 — End: 2016-03-27
  Administered 2016-03-27: 650 mg via ORAL

## 2016-03-27 MED ORDER — PROCHLORPERAZINE MALEATE 10 MG PO TABS
ORAL_TABLET | ORAL | Status: AC
  Filled 2016-03-27: qty 1

## 2016-03-27 MED ORDER — SODIUM CHLORIDE 0.9 % IV SOLN
Freq: Once | INTRAVENOUS | Status: AC
  Administered 2016-03-27: 09:00:00 via INTRAVENOUS

## 2016-03-27 MED ORDER — METHYLPREDNISOLONE SODIUM SUCC 125 MG IJ SOLR
INTRAMUSCULAR | Status: AC
  Filled 2016-03-27: qty 2

## 2016-03-27 MED ORDER — SODIUM CHLORIDE 0.9 % IV SOLN
15.2000 mg/kg | Freq: Once | INTRAVENOUS | Status: AC
  Administered 2016-03-27: 1200 mg via INTRAVENOUS
  Filled 2016-03-27: qty 60

## 2016-03-27 MED ORDER — SODIUM CHLORIDE 0.9% FLUSH
10.0000 mL | Freq: Once | INTRAVENOUS | Status: AC
  Administered 2016-03-27: 10 mL
  Filled 2016-03-27: qty 10

## 2016-03-27 MED ORDER — HEPARIN SOD (PORK) LOCK FLUSH 100 UNIT/ML IV SOLN
500.0000 [IU] | Freq: Once | INTRAVENOUS | Status: AC | PRN
  Administered 2016-03-27: 500 [IU]
  Filled 2016-03-27: qty 5

## 2016-03-27 NOTE — Assessment & Plan Note (Signed)
This is chronic in nature. It is stable. Recommend observation only. We'll give him a reduced dose Zometa twice a year due to chronic renal failure He does not require dose adjustment for his chemotherapy.  

## 2016-03-27 NOTE — Assessment & Plan Note (Signed)
He tolerated chemotherapy well. He has pancytopenic but remained asymptomatic. Denies bone pain. No peripheral neuropathy. We will continue treatment without dose adjustment. I plan to repeat myeloma panel after 4 doses of treatment. I will continue to see him every other week. He will continue acyclovir for antimicrobial prophylaxis and weekly pulsed dexamethasone on Mondays He will take calcium, vitamin D and will receive Zometa every 6 months, next due around August 2018.

## 2016-03-27 NOTE — Patient Instructions (Signed)
[facility] Discharge Instructions for Patients Receiving Chemotherapy  Today you received the following chemotherapy agents Darzalex and Velcade   To help prevent nausea and vomiting after your treatment, we encourage you to take your nausea medication as directed.    If you develop nausea and vomiting that is not controlled by your nausea medication, call the clinic.   BELOW ARE SYMPTOMS THAT SHOULD BE REPORTED IMMEDIATELY:  *FEVER GREATER THAN 100.5 F  *CHILLS WITH OR WITHOUT FEVER  NAUSEA AND VOMITING THAT IS NOT CONTROLLED WITH YOUR NAUSEA MEDICATION  *UNUSUAL SHORTNESS OF BREATH  *UNUSUAL BRUISING OR BLEEDING  TENDERNESS IN MOUTH AND THROAT WITH OR WITHOUT PRESENCE OF ULCERS  *URINARY PROBLEMS  *BOWEL PROBLEMS  UNUSUAL RASH Items with * indicate a potential emergency and should be followed up as soon as possible.  Feel free to call the clinic you have any questions or concerns. The clinic phone number is [phone].  Please show the CHEMO ALERT CARD at check-in to the Emergency Department and triage nurse.   

## 2016-03-27 NOTE — Progress Notes (Signed)
[facility] OFFICE PROGRESS NOTE  Patient Care Team: Lucianne Lei, MD as PCP - General (Family Medicine) Heath Lark, MD as Consulting Physician (Hematology and Oncology) Verlin Grills, RN as [facility]  SUMMARY OF ONCOLOGIC HISTORY:   Multiple myeloma in relapse ([place])   12/12/2010 Initial Diagnosis    Multiple myeloma      02/16/2014 Imaging    Skeletal survey showed diffuse osteopenia      03/02/2014 Bone Marrow Biopsy    Accession: [id number] BM biopsy showed only 5 % plasma cells. However, the biopsy was difficult and the bone was fragmented during the procedure. Cytogenetics and FISH study is normal      03/03/2014 Procedure    he has port placement      03/10/2014 - 05/19/2014 Chemotherapy    he received elotuzumab and revlimid      05/20/2014 - 02/25/2016 Chemotherapy    Treatment is switched to maintenance Revlimid only. Treatment is stopped due to progressive disease      11/15/2014 Adverse Reaction    He has mild worsening anemia. Does of Revlimid reduced to 5 mg 21 days on, 7 days off      03/06/2016 -  Chemotherapy    He received Daratumumab and Velcade       INTERVAL HISTORY: Please see below for problem oriented charting. He is seen prior to cycle 4 of treatment. He feels well. Denies recent bone pain. No recent nausea or vomiting. No worsening peripheral neuropathy.  REVIEW OF SYSTEMS:   Constitutional: Denies fevers, chills or abnormal weight loss Eyes: Denies blurriness of vision Ears, nose, mouth, throat, and face: Denies mucositis or sore throat Respiratory: Denies cough, dyspnea or wheezes Cardiovascular: Denies palpitation, chest discomfort or lower extremity swelling Gastrointestinal:  Denies nausea, heartburn or change in bowel habits Skin: Denies abnormal skin rashes Lymphatics: Denies new lymphadenopathy or easy bruising Neurological:Denies numbness, tingling or new weaknesses Behavioral/Psych:  Mood is stable, no new changes  All other systems were reviewed with the patient and are negative.  I have reviewed the past medical history, past surgical history, social history and family history with the patient and they are unchanged from previous note.  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  MEDICATIONS:  Current Outpatient Prescriptions  Medication Sig Dispense Refill  . acyclovir (ZOVIRAX) 400 MG tablet Take 1 tablet (400 mg total) by mouth daily. 60 tablet 11  . allopurinol (ZYLOPRIM) 100 MG tablet Take 100 mg by mouth 2 (two) times daily.     Marland Kitchen amoxicillin-clavulanate (AUGMENTIN) 875-125 MG tablet Take 1 tablet by mouth every 12 (twelve) hours. 6 tablet 0  . aspirin EC 81 MG tablet Take 81 mg by mouth daily.    Marland Kitchen atorvastatin (LIPITOR) 20 MG tablet Take 20 mg by mouth at bedtime.     . brimonidine (ALPHAGAN) 0.15 % ophthalmic solution Place 1 drop into the right eye 3 (three) times daily.    . cholecalciferol (VITAMIN D) 1000 UNITS tablet Take 1,000 Units by mouth daily.    Marland Kitchen dexamethasone (DECADRON) 4 MG tablet Take 5 tablets every Mondays 20 tablet 4  . dorzolamide-timolol (COSOPT) 22.3-6.8 MG/ML ophthalmic solution Place 1 drop into both eyes 2 (two) times daily.     . fluorometholone (FML) 0.1 % ophthalmic suspension Place 1 drop into both eyes daily.    Marland Kitchen latanoprost (XALATAN) 0.005 % ophthalmic solution Place 1 drop into the right eye at bedtime.    . lidocaine-prilocaine (  EMLA) cream Apply 1 application topically as needed. 30 g 1  . lidocaine-prilocaine (EMLA) cream Apply to affected area once 30 g 3  . loperamide (IMODIUM A-D) 2 MG tablet 1 po after each watery BM.  Maximum 6 per 24hrs. 30 tablet PRN  . ondansetron (ZOFRAN) 8 MG tablet Take 1 tablet (8 mg total) by mouth 2 (two) times daily as needed (Nausea or vomiting). 30 tablet 1  . prochlorperazine (COMPAZINE) 10 MG tablet Take 1 tablet (10 mg total) by mouth every 6 (six) hours as needed (Nausea or  vomiting). 30 tablet 1   No current facility-administered medications for this visit.    Facility-Administered Medications Ordered in Other Visits  Medication Dose Route Frequency Provider Last Rate Last Dose  . bortezomib SQ (VELCADE) chemo injection 2.5 mg  1.3 mg/m2 (Treatment Plan Recorded) Subcutaneous Once Heath Lark, MD      . heparin lock flush 100 unit/mL  500 Units Intracatheter Once PRN Heath Lark, MD      . sodium chloride flush (NS) 0.9 % injection 10 mL  10 mL Intracatheter PRN Heath Lark, MD        PHYSICAL EXAMINATION: ECOG PERFORMANCE STATUS: 1 - Symptomatic but completely ambulatory GENERAL:alert, no distress and comfortable SKIN: skin color, texture, turgor are normal, no rashes or significant lesions EYES: normal, Conjunctiva are pink and non-injected, sclera clear OROPHARYNX:no exudate, no erythema and lips, buccal mucosa, and tongue normal  NECK: supple, thyroid normal size, non-tender, without nodularity LYMPH:  no palpable lymphadenopathy in the cervical, axillary or inguinal LUNGS: clear to auscultation and percussion with normal breathing effort HEART: regular rate & rhythm and no murmurs and no lower extremity edema ABDOMEN:abdomen soft, non-tender and normal bowel sounds Musculoskeletal:no cyanosis of digits and no clubbing  NEURO: alert & oriented x 3 with fluent speech, no focal motor/sensory deficits  LABORATORY DATA:  I have reviewed the data as listed    Component Value Date/Time   NA 135 (L) 03/27/2016 0746   K 4.5 03/27/2016 0746   CL 108 04/10/2015 0405   CL 107 06/29/2012 1131   CO2 23 03/27/2016 0746   GLUCOSE 102 03/27/2016 0746   GLUCOSE 143 (H) 06/29/2012 1131   BUN 34.6 (H) 03/27/2016 0746   CREATININE 2.3 (H) 03/27/2016 0746   CALCIUM 8.6 03/27/2016 0746   PROT 6.6 03/27/2016 0746   ALBUMIN 3.3 (L) 03/27/2016 0746   AST 23 03/27/2016 0746   ALT 57 (H) 03/27/2016 0746   ALKPHOS 63 03/27/2016 0746   BILITOT 0.59 03/27/2016 0746    GFRNONAA 38 (L) 04/10/2015 0405   GFRAA 44 (L) 04/10/2015 0405    No results found for: SPEP, UPEP  Lab Results  Component Value Date   WBC 12.1 (H) 03/27/2016   NEUTROABS 9.7 (H) 03/27/2016   HGB 8.9 (L) 03/27/2016   HCT 27.1 (L) 03/27/2016   MCV 90.1 03/27/2016   PLT 97 (L) 03/27/2016      Chemistry      Component Value Date/Time   NA 135 (L) 03/27/2016 0746   K 4.5 03/27/2016 0746   CL 108 04/10/2015 0405   CL 107 06/29/2012 1131   CO2 23 03/27/2016 0746   BUN 34.6 (H) 03/27/2016 0746   CREATININE 2.3 (H) 03/27/2016 0746      Component Value Date/Time   CALCIUM 8.6 03/27/2016 0746   ALKPHOS 63 03/27/2016 0746   AST 23 03/27/2016 0746   ALT 57 (H) 03/27/2016 0746   BILITOT 0.59 03/27/2016  0746       ASSESSMENT & PLAN:  Multiple myeloma in relapse ([place]) He tolerated chemotherapy well. He has pancytopenic but remained asymptomatic. Denies bone pain. No peripheral neuropathy. We will continue treatment without dose adjustment. I plan to repeat myeloma panel after 4 doses of treatment. I will continue to see him every other week. He will continue acyclovir for antimicrobial prophylaxis and weekly pulsed dexamethasone on Mondays He will take calcium, vitamin D and will receive Zometa every 6 months, next due around August 2018.  Pancytopenia, acquired ([place]) He is not symptomatic from anemia or thrombocytopenia. I will continue on same dose treatment without dose adjustment.  Chronic renal insufficiency, stage III (moderate) This is chronic in nature. It is stable. Recommend observation only. We'll give him a reduced dose Zometa twice a year due to chronic renal failure He does not require dose adjustment for his chemotherapy.    No orders of the defined types were placed in this encounter.  All questions were answered. The patient knows to call the clinic with any problems, questions or concerns. No barriers to learning was detected. I spent 15 minutes  counseling the patient face to face. The total time spent in the appointment was 20 minutes and more than 50% was on counseling and review of test results     Heath Lark, MD 03/27/2016 10:35 AM

## 2016-03-27 NOTE — Assessment & Plan Note (Signed)
He is not symptomatic from anemia or thrombocytopenia. I will continue on same dose treatment without dose adjustment.

## 2016-03-27 NOTE — Progress Notes (Signed)
Okay to treat today with platelet count of 97, per Dr. Alvy Bimler.

## 2016-04-03 ENCOUNTER — Other Ambulatory Visit: Payer: Self-pay | Admitting: *Deleted

## 2016-04-03 ENCOUNTER — Ambulatory Visit (HOSPITAL_BASED_OUTPATIENT_CLINIC_OR_DEPARTMENT_OTHER): Payer: Medicare Other

## 2016-04-03 ENCOUNTER — Ambulatory Visit: Payer: Medicare Other

## 2016-04-03 ENCOUNTER — Other Ambulatory Visit: Payer: Self-pay | Admitting: Hematology and Oncology

## 2016-04-03 ENCOUNTER — Other Ambulatory Visit (HOSPITAL_BASED_OUTPATIENT_CLINIC_OR_DEPARTMENT_OTHER): Payer: Medicare Other

## 2016-04-03 VITALS — BP 135/71 | HR 57 | Temp 97.7°F | Resp 18

## 2016-04-03 DIAGNOSIS — Z95828 Presence of other vascular implants and grafts: Secondary | ICD-10-CM

## 2016-04-03 DIAGNOSIS — C9002 Multiple myeloma in relapse: Secondary | ICD-10-CM

## 2016-04-03 DIAGNOSIS — Z5112 Encounter for antineoplastic immunotherapy: Secondary | ICD-10-CM | POA: Diagnosis not present

## 2016-04-03 DIAGNOSIS — N183 Chronic kidney disease, stage 3 unspecified: Secondary | ICD-10-CM

## 2016-04-03 DIAGNOSIS — C9001 Multiple myeloma in remission: Secondary | ICD-10-CM

## 2016-04-03 LAB — COMPREHENSIVE METABOLIC PANEL
ALT: 47 U/L (ref 0–55)
ANION GAP: 11 meq/L (ref 3–11)
AST: 20 U/L (ref 5–34)
Albumin: 3.3 g/dL — ABNORMAL LOW (ref 3.5–5.0)
Alkaline Phosphatase: 61 U/L (ref 40–150)
BUN: 40.1 mg/dL — ABNORMAL HIGH (ref 7.0–26.0)
CALCIUM: 9.4 mg/dL (ref 8.4–10.4)
CHLORIDE: 101 meq/L (ref 98–109)
CO2: 21 mEq/L — ABNORMAL LOW (ref 22–29)
Creatinine: 2 mg/dL — ABNORMAL HIGH (ref 0.7–1.3)
EGFR: 38 mL/min/{1.73_m2} — ABNORMAL LOW (ref >90)
Glucose: 116 mg/dl (ref 70–140)
POTASSIUM: 4 meq/L (ref 3.5–5.1)
Sodium: 133 mEq/L — ABNORMAL LOW (ref 136–145)
Total Bilirubin: 0.48 mg/dL (ref 0.20–1.20)
Total Protein: 6.5 g/dL (ref 6.4–8.3)

## 2016-04-03 LAB — CBC WITH DIFFERENTIAL/PLATELET
BASO%: 0.2 % (ref 0.0–2.0)
BASOS ABS: 0 10*3/uL (ref 0.0–0.1)
EOS ABS: 0 10*3/uL (ref 0.0–0.5)
EOS%: 0 % (ref 0.0–7.0)
HEMATOCRIT: 26.1 % — AB (ref 38.4–49.9)
HGB: 8.6 g/dL — ABNORMAL LOW (ref 13.0–17.1)
LYMPH#: 1.1 10*3/uL (ref 0.9–3.3)
LYMPH%: 8.1 % — ABNORMAL LOW (ref 14.0–49.0)
MCH: 29.9 pg (ref 27.2–33.4)
MCHC: 33 g/dL (ref 32.0–36.0)
MCV: 90.5 fL (ref 79.3–98.0)
MONO#: 0.9 10*3/uL (ref 0.1–0.9)
MONO%: 6.5 % (ref 0.0–14.0)
NEUT#: 11.3 10*3/uL — ABNORMAL HIGH (ref 1.5–6.5)
NEUT%: 85.2 % — AB (ref 39.0–75.0)
PLATELETS: 79 10*3/uL — AB (ref 140–400)
RBC: 2.88 10*6/uL — ABNORMAL LOW (ref 4.20–5.82)
RDW: 20.7 % — ABNORMAL HIGH (ref 11.0–14.6)
WBC: 13.2 10*3/uL — ABNORMAL HIGH (ref 4.0–10.3)

## 2016-04-03 MED ORDER — ACETAMINOPHEN 325 MG PO TABS
ORAL_TABLET | ORAL | Status: AC
  Filled 2016-04-03: qty 2

## 2016-04-03 MED ORDER — ACETAMINOPHEN 325 MG PO TABS
650.0000 mg | ORAL_TABLET | Freq: Once | ORAL | Status: AC
  Administered 2016-04-03: 650 mg via ORAL

## 2016-04-03 MED ORDER — PROCHLORPERAZINE MALEATE 10 MG PO TABS
10.0000 mg | ORAL_TABLET | Freq: Once | ORAL | Status: AC
  Administered 2016-04-03: 10 mg via ORAL

## 2016-04-03 MED ORDER — SODIUM CHLORIDE 0.9% FLUSH
10.0000 mL | INTRAVENOUS | Status: DC | PRN
  Administered 2016-04-03: 10 mL
  Filled 2016-04-03: qty 10

## 2016-04-03 MED ORDER — HEPARIN SOD (PORK) LOCK FLUSH 100 UNIT/ML IV SOLN
500.0000 [IU] | Freq: Once | INTRAVENOUS | Status: AC | PRN
  Administered 2016-04-03: 500 [IU]
  Filled 2016-04-03: qty 5

## 2016-04-03 MED ORDER — SODIUM CHLORIDE 0.9 % IV SOLN
Freq: Once | INTRAVENOUS | Status: AC
  Administered 2016-04-03: 10:00:00 via INTRAVENOUS

## 2016-04-03 MED ORDER — SODIUM CHLORIDE 0.9 % IV SOLN
15.2000 mg/kg | Freq: Once | INTRAVENOUS | Status: AC
  Administered 2016-04-03: 1200 mg via INTRAVENOUS
  Filled 2016-04-03: qty 60

## 2016-04-03 MED ORDER — METHYLPREDNISOLONE SODIUM SUCC 125 MG IJ SOLR
125.0000 mg | Freq: Once | INTRAMUSCULAR | Status: AC
  Administered 2016-04-03: 125 mg via INTRAVENOUS

## 2016-04-03 MED ORDER — DIPHENHYDRAMINE HCL 25 MG PO CAPS
ORAL_CAPSULE | ORAL | Status: AC
  Filled 2016-04-03: qty 2

## 2016-04-03 MED ORDER — ONDANSETRON HCL 8 MG PO TABS: 8.0000 mg | ORAL_TABLET | Freq: Two times a day (BID) | ORAL | 1 refills | Status: DC | PRN

## 2016-04-03 MED ORDER — DIPHENHYDRAMINE HCL 25 MG PO CAPS
50.0000 mg | ORAL_CAPSULE | Freq: Once | ORAL | Status: AC
  Administered 2016-04-03: 50 mg via ORAL

## 2016-04-03 MED ORDER — SODIUM CHLORIDE 0.9% FLUSH
10.0000 mL | Freq: Once | INTRAVENOUS | Status: DC
  Filled 2016-04-03: qty 10

## 2016-04-03 NOTE — Progress Notes (Signed)
Port accessed in infusion due to pt being late for lab/flush appt. Karista Aispuro LPN

## 2016-04-03 NOTE — Patient Instructions (Signed)
[facility] Discharge Instructions for Patients Receiving Chemotherapy  Today you received the following chemotherapy agents: Daratumumab   To help prevent nausea and vomiting after your treatment, we encourage you to take your nausea medication as prescribed.   If you develop nausea and vomiting that is not controlled by your nausea medication, call the clinic.   BELOW ARE SYMPTOMS THAT SHOULD BE REPORTED IMMEDIATELY:  *FEVER GREATER THAN 100.5 F  *CHILLS WITH OR WITHOUT FEVER  NAUSEA AND VOMITING THAT IS NOT CONTROLLED WITH YOUR NAUSEA MEDICATION  *UNUSUAL SHORTNESS OF BREATH  *UNUSUAL BRUISING OR BLEEDING  TENDERNESS IN MOUTH AND THROAT WITH OR WITHOUT PRESENCE OF ULCERS  *URINARY PROBLEMS  *BOWEL PROBLEMS  UNUSUAL RASH Items with * indicate a potential emergency and should be followed up as soon as possible.  Feel free to call the clinic you have any questions or concerns. The clinic phone number is [phone].  Please show the CHEMO ALERT CARD at check-in to the Emergency Department and triage nurse.   

## 2016-04-03 NOTE — Progress Notes (Signed)
Per Dr. Alvy Bimler, ok to treat with daratumumab, but we will hold velcade today due to low platelets.

## 2016-04-04 LAB — KAPPA/LAMBDA LIGHT CHAINS
IG KAPPA FREE LIGHT CHAIN: 54.3 mg/L — AB (ref 3.3–19.4)
Ig Lambda Free Light Chain: 1.9 mg/L — ABNORMAL LOW (ref 5.7–26.3)
Kappa/Lambda FluidC Ratio: 28.58 — ABNORMAL HIGH (ref 0.26–1.65)

## 2016-04-05 LAB — MULTIPLE MYELOMA PANEL, SERUM
ALBUMIN/GLOB SERPL: 1.2 (ref 0.7–1.7)
Albumin SerPl Elph-Mcnc: 3.4 g/dL (ref 2.9–4.4)
Alpha 1: 0.2 g/dL (ref 0.0–0.4)
Alpha2 Glob SerPl Elph-Mcnc: 0.7 g/dL (ref 0.4–1.0)
B-Globulin SerPl Elph-Mcnc: 0.9 g/dL (ref 0.7–1.3)
Gamma Glob SerPl Elph-Mcnc: 1.2 g/dL (ref 0.4–1.8)
Globulin, Total: 3 g/dL (ref 2.2–3.9)
IgA, Qn, Serum: 23 mg/dL — ABNORMAL LOW (ref 61–437)
IgG, Qn, Serum: 1107 mg/dL (ref 700–1600)
M Protein SerPl Elph-Mcnc: 0.9 g/dL — ABNORMAL HIGH
TOTAL PROTEIN: 6.4 g/dL (ref 6.0–8.5)

## 2016-04-09 ENCOUNTER — Other Ambulatory Visit: Payer: Self-pay | Admitting: Hematology and Oncology

## 2016-04-10 ENCOUNTER — Encounter: Payer: Self-pay | Admitting: Hematology and Oncology

## 2016-04-10 ENCOUNTER — Ambulatory Visit: Payer: Medicare Other

## 2016-04-10 ENCOUNTER — Ambulatory Visit (HOSPITAL_BASED_OUTPATIENT_CLINIC_OR_DEPARTMENT_OTHER): Payer: Medicare Other | Admitting: Hematology and Oncology

## 2016-04-10 ENCOUNTER — Other Ambulatory Visit (HOSPITAL_BASED_OUTPATIENT_CLINIC_OR_DEPARTMENT_OTHER): Payer: Medicare Other

## 2016-04-10 ENCOUNTER — Ambulatory Visit (HOSPITAL_BASED_OUTPATIENT_CLINIC_OR_DEPARTMENT_OTHER): Payer: Medicare Other

## 2016-04-10 VITALS — BP 113/68 | HR 53 | Temp 98.1°F | Resp 17

## 2016-04-10 DIAGNOSIS — N183 Chronic kidney disease, stage 3 unspecified: Secondary | ICD-10-CM

## 2016-04-10 DIAGNOSIS — C9002 Multiple myeloma in relapse: Secondary | ICD-10-CM

## 2016-04-10 DIAGNOSIS — C9001 Multiple myeloma in remission: Secondary | ICD-10-CM

## 2016-04-10 DIAGNOSIS — Z95828 Presence of other vascular implants and grafts: Secondary | ICD-10-CM

## 2016-04-10 DIAGNOSIS — D61818 Other pancytopenia: Secondary | ICD-10-CM | POA: Diagnosis not present

## 2016-04-10 DIAGNOSIS — Z5112 Encounter for antineoplastic immunotherapy: Secondary | ICD-10-CM

## 2016-04-10 LAB — COMPREHENSIVE METABOLIC PANEL
ALT: 53 U/L (ref 0–55)
ANION GAP: 9 meq/L (ref 3–11)
AST: 19 U/L (ref 5–34)
Albumin: 3.2 g/dL — ABNORMAL LOW (ref 3.5–5.0)
Alkaline Phosphatase: 66 U/L (ref 40–150)
BUN: 34 mg/dL — ABNORMAL HIGH (ref 7.0–26.0)
CO2: 26 mEq/L (ref 22–29)
Calcium: 9.8 mg/dL (ref 8.4–10.4)
Chloride: 99 mEq/L (ref 98–109)
Creatinine: 1.9 mg/dL — ABNORMAL HIGH (ref 0.7–1.3)
EGFR: 40 mL/min/{1.73_m2} — AB (ref >90)
GLUCOSE: 137 mg/dL (ref 70–140)
POTASSIUM: 4.1 meq/L (ref 3.5–5.1)
SODIUM: 134 meq/L — AB (ref 136–145)
Total Bilirubin: 0.43 mg/dL (ref 0.20–1.20)
Total Protein: 6.4 g/dL (ref 6.4–8.3)

## 2016-04-10 LAB — CBC WITH DIFFERENTIAL/PLATELET
BASO%: 0.3 % (ref 0.0–2.0)
Basophils Absolute: 0 10*3/uL (ref 0.0–0.1)
EOS ABS: 0 10*3/uL (ref 0.0–0.5)
EOS%: 0 % (ref 0.0–7.0)
HCT: 27.1 % — ABNORMAL LOW (ref 38.4–49.9)
HGB: 9.1 g/dL — ABNORMAL LOW (ref 13.0–17.1)
LYMPH%: 11.1 % — AB (ref 14.0–49.0)
MCH: 30.5 pg (ref 27.2–33.4)
MCHC: 33.5 g/dL (ref 32.0–36.0)
MCV: 91.1 fL (ref 79.3–98.0)
MONO#: 0.7 10*3/uL (ref 0.1–0.9)
MONO%: 6.3 % (ref 0.0–14.0)
NEUT#: 8.9 10*3/uL — ABNORMAL HIGH (ref 1.5–6.5)
NEUT%: 82.3 % — AB (ref 39.0–75.0)
Platelets: 97 10*3/uL — ABNORMAL LOW (ref 140–400)
RBC: 2.97 10*6/uL — AB (ref 4.20–5.82)
RDW: 21.4 % — ABNORMAL HIGH (ref 11.0–14.6)
WBC: 10.9 10*3/uL — AB (ref 4.0–10.3)
lymph#: 1.2 10*3/uL (ref 0.9–3.3)

## 2016-04-10 MED ORDER — METHYLPREDNISOLONE SODIUM SUCC 40 MG IJ SOLR
INTRAMUSCULAR | Status: AC
Start: 2016-04-10 — End: 2016-04-10
  Filled 2016-04-10: qty 1

## 2016-04-10 MED ORDER — SODIUM CHLORIDE 0.9 % IV SOLN
15.2000 mg/kg | Freq: Once | INTRAVENOUS | Status: AC
  Administered 2016-04-10: 1200 mg via INTRAVENOUS
  Filled 2016-04-10: qty 60

## 2016-04-10 MED ORDER — ACETAMINOPHEN 325 MG PO TABS
ORAL_TABLET | ORAL | Status: AC
Start: 2016-04-10 — End: 2016-04-10
  Filled 2016-04-10: qty 2

## 2016-04-10 MED ORDER — METHYLPREDNISOLONE SODIUM SUCC 40 MG IJ SOLR
40.0000 mg | Freq: Once | INTRAMUSCULAR | Status: AC
  Administered 2016-04-10: 40 mg via INTRAVENOUS

## 2016-04-10 MED ORDER — PROCHLORPERAZINE MALEATE 10 MG PO TABS
ORAL_TABLET | ORAL | Status: AC
  Filled 2016-04-10: qty 1

## 2016-04-10 MED ORDER — METHYLPREDNISOLONE SODIUM SUCC 125 MG IJ SOLR: 125.0000 mg | Freq: Once | INTRAMUSCULAR | Status: DC

## 2016-04-10 MED ORDER — PROCHLORPERAZINE MALEATE 10 MG PO TABS: 10.0000 mg | ORAL_TABLET | Freq: Once | ORAL | Status: DC

## 2016-04-10 MED ORDER — DIPHENHYDRAMINE HCL 25 MG PO CAPS
50.0000 mg | ORAL_CAPSULE | Freq: Once | ORAL | Status: AC
  Administered 2016-04-10: 50 mg via ORAL

## 2016-04-10 MED ORDER — ACETAMINOPHEN 325 MG PO TABS
650.0000 mg | ORAL_TABLET | Freq: Once | ORAL | Status: AC
  Administered 2016-04-10: 650 mg via ORAL

## 2016-04-10 MED ORDER — SODIUM CHLORIDE 0.9% FLUSH
10.0000 mL | INTRAVENOUS | Status: DC | PRN
  Administered 2016-04-10: 10 mL
  Filled 2016-04-10: qty 10

## 2016-04-10 MED ORDER — SODIUM CHLORIDE 0.9% FLUSH
10.0000 mL | Freq: Once | INTRAVENOUS | Status: AC
  Administered 2016-04-10: 10 mL
  Filled 2016-04-10: qty 10

## 2016-04-10 MED ORDER — BORTEZOMIB CHEMO SQ INJECTION 3.5 MG (2.5MG/ML)
1.3000 mg/m2 | Freq: Once | INTRAMUSCULAR | Status: AC
  Administered 2016-04-10: 2.5 mg via SUBCUTANEOUS
  Filled 2016-04-10: qty 2.5

## 2016-04-10 MED ORDER — SODIUM CHLORIDE 0.9 % IV SOLN
Freq: Once | INTRAVENOUS | Status: AC
  Administered 2016-04-10: 08:00:00 via INTRAVENOUS

## 2016-04-10 MED ORDER — PROCHLORPERAZINE MALEATE 10 MG PO TABS
10.0000 mg | ORAL_TABLET | Freq: Once | ORAL | Status: AC
  Administered 2016-04-10: 10 mg via ORAL

## 2016-04-10 MED ORDER — DIPHENHYDRAMINE HCL 25 MG PO CAPS
ORAL_CAPSULE | ORAL | Status: AC
  Filled 2016-04-10: qty 2

## 2016-04-10 MED ORDER — HEPARIN SOD (PORK) LOCK FLUSH 100 UNIT/ML IV SOLN
500.0000 [IU] | Freq: Once | INTRAVENOUS | Status: AC | PRN
  Administered 2016-04-10: 500 [IU]
  Filled 2016-04-10: qty 5

## 2016-04-10 NOTE — Assessment & Plan Note (Signed)
He is not symptomatic from anemia or thrombocytopenia. I will continue on same dose treatment without dose adjustment.

## 2016-04-10 NOTE — Patient Instructions (Signed)
Implanted Port Home Guide An implanted port is a type of central line that is placed under the skin. Central lines are used to provide IV access when treatment or nutrition needs to be given through a person's veins. Implanted ports are used for long-term IV access. An implanted port may be placed because:  You need IV medicine that would be irritating to the small veins in your hands or arms.  You need long-term IV medicines, such as antibiotics.  You need IV nutrition for a long period.  You need frequent blood draws for lab tests.  You need dialysis.  Implanted ports are usually placed in the chest area, but they can also be placed in the upper arm, the abdomen, or the leg. An implanted port has two main parts:  Reservoir. The reservoir is round and will appear as a small, raised area under your skin. The reservoir is the part where a needle is inserted to give medicines or draw blood.  Catheter. The catheter is a thin, flexible tube that extends from the reservoir. The catheter is placed into a large vein. Medicine that is inserted into the reservoir goes into the catheter and then into the vein.  How will I care for my incision site? Do not get the incision site wet. Bathe or shower as directed by your health care provider. How is my port accessed? Special steps must be taken to access the port:  Before the port is accessed, a numbing cream can be placed on the skin. This helps numb the skin over the port site.  Your health care provider uses a sterile technique to access the port. ? Your health care provider must put on a mask and sterile gloves. ? The skin over your port is cleaned carefully with an antiseptic and allowed to dry. ? The port is gently pinched between sterile gloves, and a needle is inserted into the port.  Only "non-coring" port needles should be used to access the port. Once the port is accessed, a blood return should be checked. This helps ensure that the port  is in the vein and is not clogged.  If your port needs to remain accessed for a constant infusion, a clear (transparent) bandage will be placed over the needle site. The bandage and needle will need to be changed every week, or as directed by your health care provider.  Keep the bandage covering the needle clean and dry. Do not get it wet. Follow your health care provider's instructions on how to take a shower or bath while the port is accessed.  If your port does not need to stay accessed, no bandage is needed over the port.  What is flushing? Flushing helps keep the port from getting clogged. Follow your health care provider's instructions on how and when to flush the port. Ports are usually flushed with saline solution or a medicine called heparin. The need for flushing will depend on how the port is used.  If the port is used for intermittent medicines or blood draws, the port will need to be flushed: ? After medicines have been given. ? After blood has been drawn. ? As part of routine maintenance.  If a constant infusion is running, the port may not need to be flushed.  How long will my port stay implanted? The port can stay in for as long as your health care provider thinks it is needed. When it is time for the port to come out, surgery will be   done to remove it. The procedure is similar to the one performed when the port was put in. When should I seek immediate medical care? When you have an implanted port, you should seek immediate medical care if:  You notice a bad smell coming from the incision site.  You have swelling, redness, or drainage at the incision site.  You have more swelling or pain at the port site or the surrounding area.  You have a fever that is not controlled with medicine.  This information is not intended to replace advice given to you by your health care provider. Make sure you discuss any questions you have with your health care provider. Document  Released: 12/24/2004 Document Revised: 06/01/2015 Document Reviewed: 08/31/2012 [employer] Interactive Patient Education  2017 [employer]  

## 2016-04-10 NOTE — Progress Notes (Signed)
[facility] OFFICE PROGRESS NOTE  Patient Care Team: Lucianne Lei, MD as PCP - General (Family Medicine) Heath Lark, MD as Consulting Physician (Hematology and Oncology) Verlin Grills, RN as [place] [facility]  SUMMARY OF ONCOLOGIC HISTORY:   Multiple myeloma in relapse ([place])   12/12/2010 Initial Diagnosis    Multiple myeloma      02/16/2014 Imaging    Skeletal survey showed diffuse osteopenia      03/02/2014 Bone Marrow Biopsy    Accession: [id number] BM biopsy showed only 5 % plasma cells. However, the biopsy was difficult and the bone was fragmented during the procedure. Cytogenetics and FISH study is normal      03/03/2014 Procedure    he has port placement      03/10/2014 - 05/19/2014 Chemotherapy    he received elotuzumab and revlimid      05/20/2014 - 02/25/2016 Chemotherapy    Treatment is switched to maintenance Revlimid only. Treatment is stopped due to progressive disease      11/15/2014 Adverse Reaction    He has mild worsening anemia. Does of Revlimid reduced to 5 mg 21 days on, 7 days off      03/06/2016 -  Chemotherapy    He received Daratumumab and Velcade       INTERVAL HISTORY: Please see below for problem oriented charting. He is seen in the infusion room He denies worsening peripheral neuropathy No recent infection No bone pain. The patient denies any recent signs or symptoms of bleeding such as spontaneous epistaxis, hematuria or hematochezia. He complained of mild reflux  REVIEW OF SYSTEMS:   Constitutional: Denies fevers, chills or abnormal weight loss Eyes: Denies blurriness of vision Ears, nose, mouth, throat, and face: Denies mucositis or sore throat Respiratory: Denies cough, dyspnea or wheezes Cardiovascular: Denies palpitation, chest discomfort or lower extremity swelling Skin: Denies abnormal skin rashes Lymphatics: Denies new lymphadenopathy or easy bruising Neurological:Denies numbness, tingling  or new weaknesses Behavioral/Psych: Mood is stable, no new changes  All other systems were reviewed with the patient and are negative.  I have reviewed the past medical history, past surgical history, social history and family history with the patient and they are unchanged from previous note.  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  MEDICATIONS:  Current Outpatient Prescriptions  Medication Sig Dispense Refill  . acyclovir (ZOVIRAX) 400 MG tablet Take 1 tablet (400 mg total) by mouth daily. 60 tablet 11  . allopurinol (ZYLOPRIM) 100 MG tablet Take 100 mg by mouth 2 (two) times daily.     Marland Kitchen amoxicillin-clavulanate (AUGMENTIN) 875-125 MG tablet Take 1 tablet by mouth every 12 (twelve) hours. 6 tablet 0  . aspirin EC 81 MG tablet Take 81 mg by mouth daily.    Marland Kitchen atorvastatin (LIPITOR) 20 MG tablet Take 20 mg by mouth at bedtime.     . brimonidine (ALPHAGAN) 0.15 % ophthalmic solution Place 1 drop into the right eye 3 (three) times daily.    . cholecalciferol (VITAMIN D) 1000 UNITS tablet Take 1,000 Units by mouth daily.    Marland Kitchen dexamethasone (DECADRON) 4 MG tablet Take 5 tablets every Mondays 20 tablet 4  . dorzolamide-timolol (COSOPT) 22.3-6.8 MG/ML ophthalmic solution Place 1 drop into both eyes 2 (two) times daily.     . fluorometholone (FML) 0.1 % ophthalmic suspension Place 1 drop into both eyes daily.    Marland Kitchen latanoprost (XALATAN) 0.005 % ophthalmic solution Place 1 drop into the right eye at bedtime.    Marland Kitchen  lidocaine-prilocaine (EMLA) cream Apply 1 application topically as needed. 30 g 1  . lidocaine-prilocaine (EMLA) cream Apply to affected area once 30 g 3  . loperamide (IMODIUM A-D) 2 MG tablet 1 po after each watery BM.  Maximum 6 per 24hrs. 30 tablet PRN  . ondansetron (ZOFRAN) 8 MG tablet Take 1 tablet (8 mg total) by mouth 2 (two) times daily as needed (Nausea or vomiting). 30 tablet 1  . prochlorperazine (COMPAZINE) 10 MG tablet Take 1 tablet (10 mg total) by mouth every 6  (six) hours as needed (Nausea or vomiting). 30 tablet 1   No current facility-administered medications for this visit.    Facility-Administered Medications Ordered in Other Visits  Medication Dose Route Frequency Provider Last Rate Last Dose  . bortezomib SQ (VELCADE) chemo injection 2.5 mg  1.3 mg/m2 (Treatment Plan Recorded) Subcutaneous Once Heath Lark, MD      . daratumumab (DARZALEX) 1,200 mg in sodium chloride 0.9 % 440 mL (2.4 mg/mL) chemo infusion  15.2 mg/kg (Treatment Plan Recorded) Intravenous Once Heath Lark, MD      . heparin lock flush 100 unit/mL  500 Units Intracatheter Once PRN Heath Lark, MD      . sodium chloride flush (NS) 0.9 % injection 10 mL  10 mL Intracatheter PRN Heath Lark, MD        PHYSICAL EXAMINATION: ECOG PERFORMANCE STATUS: 1 - Symptomatic but completely ambulatory GENERAL:alert, no distress and comfortable SKIN: skin color, texture, turgor are normal, no rashes or significant lesions EYES: normal, Conjunctiva are pink and non-injected, sclera clear OROPHARYNX:no exudate, no erythema and lips, buccal mucosa, and tongue normal  NECK: supple, thyroid normal size, non-tender, without nodularity LYMPH:  no palpable lymphadenopathy in the cervical, axillary or inguinal LUNGS: clear to auscultation and percussion with normal breathing effort HEART: regular rate & rhythm and no murmurs and no lower extremity edema ABDOMEN:abdomen soft, non-tender and normal bowel sounds Musculoskeletal:no cyanosis of digits and no clubbing  NEURO: alert & oriented x 3 with fluent speech, no focal motor/sensory deficits  LABORATORY DATA:  I have reviewed the data as listed    Component Value Date/Time   NA 134 (L) 04/10/2016 0806   K 4.1 04/10/2016 0806   CL 108 04/10/2015 0405   CL 107 06/29/2012 1131   CO2 26 04/10/2016 0806   GLUCOSE 137 04/10/2016 0806   GLUCOSE 143 (H) 06/29/2012 1131   BUN 34.0 (H) 04/10/2016 0806   CREATININE 1.9 (H) 04/10/2016 0806   CALCIUM 9.8  04/10/2016 0806   PROT 6.4 04/10/2016 0806   ALBUMIN 3.2 (L) 04/10/2016 0806   AST 19 04/10/2016 0806   ALT 53 04/10/2016 0806   ALKPHOS 66 04/10/2016 0806   BILITOT 0.43 04/10/2016 0806   GFRNONAA 38 (L) 04/10/2015 0405   GFRAA 44 (L) 04/10/2015 0405    No results found for: SPEP, UPEP  Lab Results  Component Value Date   WBC 10.9 (H) 04/10/2016   NEUTROABS 8.9 (H) 04/10/2016   HGB 9.1 (L) 04/10/2016   HCT 27.1 (L) 04/10/2016   MCV 91.1 04/10/2016   PLT 97 (L) 04/10/2016      Chemistry      Component Value Date/Time   NA 134 (L) 04/10/2016 0806   K 4.1 04/10/2016 0806   CL 108 04/10/2015 0405   CL 107 06/29/2012 1131   CO2 26 04/10/2016 0806   BUN 34.0 (H) 04/10/2016 0806   CREATININE 1.9 (H) 04/10/2016 0806      Component  Value Date/Time   CALCIUM 9.8 04/10/2016 0806   ALKPHOS 66 04/10/2016 0806   AST 19 04/10/2016 0806   ALT 53 04/10/2016 0806   BILITOT 0.43 04/10/2016 0806      ASSESSMENT & PLAN:  Multiple myeloma in relapse ([place]) He tolerated chemotherapy well. He has pancytopenic but remained asymptomatic. Denies bone pain. No peripheral neuropathy. We will continue treatment without dose adjustment. Recent myeloma panel show significant partial response with only after 4 doses of treatment I will continue to see him every other week. He will continue acyclovir for antimicrobial prophylaxis and weekly pulsed dexamethasone on Mondays Due to recent GERD, I plan to reduce a dose of Solu-Medrol to 40 mg I plan to start dexamethasone taper after his next visit He will take calcium, vitamin D and will receive Zometa every 6 months, next due around August 2018.  Pancytopenia, acquired ([place]) He is not symptomatic from anemia or thrombocytopenia. I will continue on same dose treatment without dose adjustment.  Chronic renal insufficiency, stage III (moderate) This is chronic in nature. It is stable. Recommend observation only. We'll give him a reduced dose  Zometa twice a year due to chronic renal failure He does not require dose adjustment for his chemotherapy.    Orders Placed This Encounter  Procedures  . Kappa/lambda light chains    Standing Status:   Future    Standing Expiration Date:   05/15/2017  . Multiple Myeloma Panel (SPEP&IFE w/QIG)    Standing Status:   Future    Standing Expiration Date:   05/15/2017   All questions were answered. The patient knows to call the clinic with any problems, questions or concerns. No barriers to learning was detected. I spent 15 minutes counseling the patient face to face. The total time spent in the appointment was 20 minutes and more than 50% was on counseling and review of test results     Heath Lark, MD 04/10/2016 9:57 AM

## 2016-04-10 NOTE — Assessment & Plan Note (Signed)
This is chronic in nature. It is stable. Recommend observation only. We'll give him a reduced dose Zometa twice a year due to chronic renal failure He does not require dose adjustment for his chemotherapy.  

## 2016-04-10 NOTE — Patient Instructions (Signed)
[facility] Discharge Instructions for Patients Receiving Chemotherapy  Today you received the following chemotherapy agents: Velcade and Darzalex.   To help prevent nausea and vomiting after your treatment, we encourage you to take your nausea medication as directed.   If you develop nausea and vomiting that is not controlled by your nausea medication, call the clinic.   BELOW ARE SYMPTOMS THAT SHOULD BE REPORTED IMMEDIATELY:  *FEVER GREATER THAN 100.5 F  *CHILLS WITH OR WITHOUT FEVER  NAUSEA AND VOMITING THAT IS NOT CONTROLLED WITH YOUR NAUSEA MEDICATION  *UNUSUAL SHORTNESS OF BREATH  *UNUSUAL BRUISING OR BLEEDING  TENDERNESS IN MOUTH AND THROAT WITH OR WITHOUT PRESENCE OF ULCERS  *URINARY PROBLEMS  *BOWEL PROBLEMS  UNUSUAL RASH Items with * indicate a potential emergency and should be followed up as soon as possible.  Feel free to call the clinic you have any questions or concerns. The clinic phone number is [phone].  Please show the CHEMO ALERT CARD at check-in to the Emergency Department and triage nurse.   

## 2016-04-10 NOTE — Assessment & Plan Note (Signed)
He tolerated chemotherapy well. He has pancytopenic but remained asymptomatic. Denies bone pain. No peripheral neuropathy. We will continue treatment without dose adjustment. Recent myeloma panel show significant partial response with only after 4 doses of treatment I will continue to see him every other week. He will continue acyclovir for antimicrobial prophylaxis and weekly pulsed dexamethasone on Mondays Due to recent GERD, I plan to reduce a dose of Solu-Medrol to 40 mg I plan to start dexamethasone taper after his next visit He will take calcium, vitamin D and will receive Zometa every 6 months, next due around August 2018.

## 2016-04-10 NOTE — Progress Notes (Signed)
Okay to treat with labs 04/10/16 (platelets 97 and creatine 1.9) per Dr. Alvy Bimler. Pt reports heart burn, Dr. Alvy Bimler aware and Solumedrol reduced to 40 mg IV per Dr. Alvy Bimler. Pharmacy aware and pt aware.

## 2016-04-17 ENCOUNTER — Other Ambulatory Visit: Payer: Self-pay | Admitting: Hematology and Oncology

## 2016-04-17 ENCOUNTER — Other Ambulatory Visit: Payer: Medicare Other

## 2016-04-17 ENCOUNTER — Ambulatory Visit: Payer: Medicare Other

## 2016-04-17 ENCOUNTER — Ambulatory Visit (HOSPITAL_BASED_OUTPATIENT_CLINIC_OR_DEPARTMENT_OTHER): Payer: Medicare Other

## 2016-04-17 VITALS — BP 112/64 | HR 64 | Temp 97.7°F | Resp 16

## 2016-04-17 DIAGNOSIS — Z95828 Presence of other vascular implants and grafts: Secondary | ICD-10-CM

## 2016-04-17 DIAGNOSIS — C9002 Multiple myeloma in relapse: Secondary | ICD-10-CM | POA: Diagnosis not present

## 2016-04-17 DIAGNOSIS — N183 Chronic kidney disease, stage 3 unspecified: Secondary | ICD-10-CM

## 2016-04-17 DIAGNOSIS — Z5112 Encounter for antineoplastic immunotherapy: Secondary | ICD-10-CM

## 2016-04-17 DIAGNOSIS — C9001 Multiple myeloma in remission: Secondary | ICD-10-CM

## 2016-04-17 LAB — CBC WITH DIFFERENTIAL/PLATELET
BASO%: 1.2 % (ref 0.0–2.0)
Basophils Absolute: 0.1 10*3/uL (ref 0.0–0.1)
EOS%: 0 % (ref 0.0–7.0)
Eosinophils Absolute: 0 10*3/uL (ref 0.0–0.5)
HCT: 26.8 % — ABNORMAL LOW (ref 38.4–49.9)
HGB: 8.9 g/dL — ABNORMAL LOW (ref 13.0–17.1)
LYMPH#: 0.7 10*3/uL — AB (ref 0.9–3.3)
LYMPH%: 15.7 % (ref 14.0–49.0)
MCH: 30.6 pg (ref 27.2–33.4)
MCHC: 33.4 g/dL (ref 32.0–36.0)
MCV: 91.6 fL (ref 79.3–98.0)
MONO#: 0.5 10*3/uL (ref 0.1–0.9)
MONO%: 10.7 % (ref 0.0–14.0)
NEUT#: 3.1 10*3/uL (ref 1.5–6.5)
NEUT%: 72.4 % (ref 39.0–75.0)
Platelets: 63 10*3/uL — ABNORMAL LOW (ref 140–400)
RBC: 2.92 10*6/uL — AB (ref 4.20–5.82)
RDW: 21.9 % — ABNORMAL HIGH (ref 11.0–14.6)
WBC: 4.3 10*3/uL (ref 4.0–10.3)

## 2016-04-17 LAB — COMPREHENSIVE METABOLIC PANEL
ALT: 49 U/L (ref 0–55)
AST: 25 U/L (ref 5–34)
Albumin: 3 g/dL — ABNORMAL LOW (ref 3.5–5.0)
Alkaline Phosphatase: 72 U/L (ref 40–150)
Anion Gap: 9 mEq/L (ref 3–11)
BUN: 17.5 mg/dL (ref 7.0–26.0)
CO2: 24 meq/L (ref 22–29)
CREATININE: 1.8 mg/dL — AB (ref 0.7–1.3)
Calcium: 8.6 mg/dL (ref 8.4–10.4)
Chloride: 101 mEq/L (ref 98–109)
EGFR: 43 mL/min/{1.73_m2} — ABNORMAL LOW (ref >90)
Glucose: 97 mg/dl (ref 70–140)
POTASSIUM: 4.2 meq/L (ref 3.5–5.1)
SODIUM: 134 meq/L — AB (ref 136–145)
Total Bilirubin: 0.57 mg/dL (ref 0.20–1.20)
Total Protein: 6 g/dL — ABNORMAL LOW (ref 6.4–8.3)

## 2016-04-17 MED ORDER — DIPHENHYDRAMINE HCL 25 MG PO CAPS
ORAL_CAPSULE | ORAL | Status: AC
  Filled 2016-04-17: qty 2

## 2016-04-17 MED ORDER — METHYLPREDNISOLONE SODIUM SUCC 40 MG IJ SOLR
INTRAMUSCULAR | Status: AC
  Filled 2016-04-17: qty 1

## 2016-04-17 MED ORDER — SODIUM CHLORIDE 0.9 % IV SOLN
15.3000 mg/kg | Freq: Once | INTRAVENOUS | Status: AC
  Administered 2016-04-17: 1200 mg via INTRAVENOUS
  Filled 2016-04-17: qty 60

## 2016-04-17 MED ORDER — DIPHENHYDRAMINE HCL 25 MG PO CAPS
50.0000 mg | ORAL_CAPSULE | Freq: Once | ORAL | Status: AC
  Administered 2016-04-17: 50 mg via ORAL

## 2016-04-17 MED ORDER — SODIUM CHLORIDE 0.9% FLUSH
10.0000 mL | Freq: Once | INTRAVENOUS | Status: AC
  Administered 2016-04-17: 10 mL
  Filled 2016-04-17: qty 10

## 2016-04-17 MED ORDER — ACETAMINOPHEN 325 MG PO TABS
650.0000 mg | ORAL_TABLET | Freq: Once | ORAL | Status: AC
  Administered 2016-04-17: 650 mg via ORAL

## 2016-04-17 MED ORDER — SODIUM CHLORIDE 0.9 % IV SOLN
Freq: Once | INTRAVENOUS | Status: AC
  Administered 2016-04-17: 09:00:00 via INTRAVENOUS

## 2016-04-17 MED ORDER — PROCHLORPERAZINE MALEATE 10 MG PO TABS
10.0000 mg | ORAL_TABLET | Freq: Once | ORAL | Status: AC
  Administered 2016-04-17: 10 mg via ORAL

## 2016-04-17 MED ORDER — SODIUM CHLORIDE 0.9% FLUSH
10.0000 mL | INTRAVENOUS | Status: DC | PRN
  Administered 2016-04-17: 10 mL
  Filled 2016-04-17: qty 10

## 2016-04-17 MED ORDER — PROCHLORPERAZINE MALEATE 10 MG PO TABS
ORAL_TABLET | ORAL | Status: AC
  Filled 2016-04-17: qty 1

## 2016-04-17 MED ORDER — ACETAMINOPHEN 325 MG PO TABS
ORAL_TABLET | ORAL | Status: AC
  Filled 2016-04-17: qty 2

## 2016-04-17 MED ORDER — METHYLPREDNISOLONE SODIUM SUCC 40 MG IJ SOLR
40.0000 mg | Freq: Once | INTRAMUSCULAR | Status: AC
  Administered 2016-04-17: 40 mg via INTRAVENOUS

## 2016-04-17 MED ORDER — HEPARIN SOD (PORK) LOCK FLUSH 100 UNIT/ML IV SOLN
500.0000 [IU] | Freq: Once | INTRAVENOUS | Status: AC | PRN
  Administered 2016-04-17: 500 [IU]
  Filled 2016-04-17: qty 5

## 2016-04-17 NOTE — Progress Notes (Signed)
With CBC and CMP today, per MD Alvy Bimler, hold velcade today, ok to proceed with darzalex today.

## 2016-04-17 NOTE — Patient Instructions (Signed)
[facility] Discharge Instructions for Patients Receiving Chemotherapy  Today you received the following chemotherapy agents Darzalex.  To help prevent nausea and vomiting after your treatment, we encourage you to take your nausea medication as directed.  If you develop nausea and vomiting that is not controlled by your nausea medication, call the clinic.   BELOW ARE SYMPTOMS THAT SHOULD BE REPORTED IMMEDIATELY:  *FEVER GREATER THAN 100.5 F  *CHILLS WITH OR WITHOUT FEVER  NAUSEA AND VOMITING THAT IS NOT CONTROLLED WITH YOUR NAUSEA MEDICATION  *UNUSUAL SHORTNESS OF BREATH  *UNUSUAL BRUISING OR BLEEDING  TENDERNESS IN MOUTH AND THROAT WITH OR WITHOUT PRESENCE OF ULCERS  *URINARY PROBLEMS  *BOWEL PROBLEMS  UNUSUAL RASH Items with * indicate a potential emergency and should be followed up as soon as possible.  Feel free to call the clinic you have any questions or concerns. The clinic phone number is [phone].  Please show the CHEMO ALERT CARD at check-in to the Emergency Department and triage nurse.    

## 2016-04-24 ENCOUNTER — Ambulatory Visit (HOSPITAL_BASED_OUTPATIENT_CLINIC_OR_DEPARTMENT_OTHER): Payer: Medicare Other | Admitting: Hematology and Oncology

## 2016-04-24 ENCOUNTER — Other Ambulatory Visit: Payer: Self-pay | Admitting: Hematology and Oncology

## 2016-04-24 ENCOUNTER — Telehealth: Payer: Self-pay | Admitting: Hematology and Oncology

## 2016-04-24 ENCOUNTER — Ambulatory Visit (HOSPITAL_BASED_OUTPATIENT_CLINIC_OR_DEPARTMENT_OTHER): Payer: Medicare Other

## 2016-04-24 ENCOUNTER — Ambulatory Visit: Payer: Medicare Other

## 2016-04-24 ENCOUNTER — Other Ambulatory Visit: Payer: Medicare Other

## 2016-04-24 VITALS — BP 113/59 | HR 58 | Temp 97.6°F | Resp 16

## 2016-04-24 DIAGNOSIS — C9002 Multiple myeloma in relapse: Secondary | ICD-10-CM

## 2016-04-24 DIAGNOSIS — D61818 Other pancytopenia: Secondary | ICD-10-CM | POA: Diagnosis not present

## 2016-04-24 DIAGNOSIS — Z5112 Encounter for antineoplastic immunotherapy: Secondary | ICD-10-CM | POA: Diagnosis not present

## 2016-04-24 DIAGNOSIS — N183 Chronic kidney disease, stage 3 unspecified: Secondary | ICD-10-CM

## 2016-04-24 DIAGNOSIS — G620 Drug-induced polyneuropathy: Secondary | ICD-10-CM

## 2016-04-24 DIAGNOSIS — T451X5A Adverse effect of antineoplastic and immunosuppressive drugs, initial encounter: Secondary | ICD-10-CM

## 2016-04-24 DIAGNOSIS — C9001 Multiple myeloma in remission: Secondary | ICD-10-CM

## 2016-04-24 LAB — CBC WITH DIFFERENTIAL/PLATELET
BASO%: 0 % (ref 0.0–2.0)
BASOS ABS: 0 10*3/uL (ref 0.0–0.1)
EOS%: 0 % (ref 0.0–7.0)
Eosinophils Absolute: 0 10*3/uL (ref 0.0–0.5)
HEMATOCRIT: 24.2 % — AB (ref 38.4–49.9)
HEMOGLOBIN: 8.3 g/dL — AB (ref 13.0–17.1)
LYMPH%: 17.6 % (ref 14.0–49.0)
MCH: 30.4 pg (ref 27.2–33.4)
MCHC: 34.3 g/dL (ref 32.0–36.0)
MCV: 88.6 fL (ref 79.3–98.0)
MONO#: 0.4 10*3/uL (ref 0.1–0.9)
MONO%: 5.4 % (ref 0.0–14.0)
NEUT#: 5.4 10*3/uL (ref 1.5–6.5)
NEUT%: 77 % — ABNORMAL HIGH (ref 39.0–75.0)
Platelets: 77 10*3/uL — ABNORMAL LOW (ref 140–400)
RBC: 2.73 10*6/uL — ABNORMAL LOW (ref 4.20–5.82)
RDW: 18.4 % — ABNORMAL HIGH (ref 11.0–14.6)
WBC: 7 10*3/uL (ref 4.0–10.3)
lymph#: 1.2 10*3/uL (ref 0.9–3.3)

## 2016-04-24 LAB — COMPREHENSIVE METABOLIC PANEL
ALBUMIN: 2.9 g/dL — AB (ref 3.5–5.0)
ALK PHOS: 71 U/L (ref 40–150)
ALT: 41 U/L (ref 0–55)
AST: 19 U/L (ref 5–34)
Anion Gap: 13 mEq/L — ABNORMAL HIGH (ref 3–11)
BILIRUBIN TOTAL: 0.56 mg/dL (ref 0.20–1.20)
BUN: 24.4 mg/dL (ref 7.0–26.0)
CALCIUM: 8.8 mg/dL (ref 8.4–10.4)
CO2: 20 mEq/L — ABNORMAL LOW (ref 22–29)
CREATININE: 1.7 mg/dL — AB (ref 0.7–1.3)
Chloride: 100 mEq/L (ref 98–109)
EGFR: 46 mL/min/{1.73_m2} — ABNORMAL LOW (ref >90)
Glucose: 165 mg/dl — ABNORMAL HIGH (ref 70–140)
POTASSIUM: 4.2 meq/L (ref 3.5–5.1)
Sodium: 133 mEq/L — ABNORMAL LOW (ref 136–145)
TOTAL PROTEIN: 6.2 g/dL — AB (ref 6.4–8.3)

## 2016-04-24 MED ORDER — SODIUM CHLORIDE 0.9 % IV SOLN
Freq: Once | INTRAVENOUS | Status: AC
  Administered 2016-04-24: 09:00:00 via INTRAVENOUS

## 2016-04-24 MED ORDER — DIPHENHYDRAMINE HCL 25 MG PO CAPS
ORAL_CAPSULE | ORAL | Status: AC
  Filled 2016-04-24: qty 2

## 2016-04-24 MED ORDER — PROCHLORPERAZINE MALEATE 10 MG PO TABS
ORAL_TABLET | ORAL | Status: AC
  Filled 2016-04-24: qty 1

## 2016-04-24 MED ORDER — HEPARIN SOD (PORK) LOCK FLUSH 100 UNIT/ML IV SOLN
500.0000 [IU] | Freq: Once | INTRAVENOUS | Status: AC | PRN
  Administered 2016-04-24: 500 [IU]
  Filled 2016-04-24: qty 5

## 2016-04-24 MED ORDER — PROCHLORPERAZINE MALEATE 10 MG PO TABS
10.0000 mg | ORAL_TABLET | Freq: Once | ORAL | Status: AC
  Administered 2016-04-24: 10 mg via ORAL

## 2016-04-24 MED ORDER — ACETAMINOPHEN 325 MG PO TABS
650.0000 mg | ORAL_TABLET | Freq: Once | ORAL | Status: AC
  Administered 2016-04-24: 650 mg via ORAL

## 2016-04-24 MED ORDER — ACETAMINOPHEN 325 MG PO TABS
ORAL_TABLET | ORAL | Status: AC
  Filled 2016-04-24: qty 2

## 2016-04-24 MED ORDER — DIPHENHYDRAMINE HCL 25 MG PO CAPS
50.0000 mg | ORAL_CAPSULE | Freq: Once | ORAL | Status: AC
  Administered 2016-04-24: 50 mg via ORAL

## 2016-04-24 MED ORDER — METHYLPREDNISOLONE SODIUM SUCC 40 MG IJ SOLR
INTRAMUSCULAR | Status: AC
  Filled 2016-04-24: qty 1

## 2016-04-24 MED ORDER — SODIUM CHLORIDE 0.9% FLUSH
10.0000 mL | INTRAVENOUS | Status: DC | PRN
  Administered 2016-04-24: 10 mL
  Filled 2016-04-24: qty 10

## 2016-04-24 MED ORDER — METHYLPREDNISOLONE SODIUM SUCC 40 MG IJ SOLR
40.0000 mg | Freq: Once | INTRAMUSCULAR | Status: AC
  Administered 2016-04-24: 40 mg via INTRAVENOUS

## 2016-04-24 MED ORDER — SODIUM CHLORIDE 0.9 % IV SOLN
15.2000 mg/kg | Freq: Once | INTRAVENOUS | Status: AC
  Administered 2016-04-24: 1200 mg via INTRAVENOUS
  Filled 2016-04-24: qty 60

## 2016-04-24 NOTE — Telephone Encounter (Signed)
Labs, flush, follow up with Dr Alvy Bimler and 8 hrs of Infusions, was scheduled for 05/22/16 and 06/12/16, per 04/24/16 los. Patient was given a copy of the AVS report and appointment schedule, per 04/24/16 los.

## 2016-04-24 NOTE — Progress Notes (Signed)
Per Jesse Fall, RN, Dr. Alvy Bimler advises to hold Velcade this treatment.

## 2016-04-24 NOTE — Patient Instructions (Signed)
[facility] Discharge Instructions for Patients Receiving Chemotherapy  Today you received the following chemotherapy agents Darzalex and Velcade   To help prevent nausea and vomiting after your treatment, we encourage you to take your nausea medication as directed.    If you develop nausea and vomiting that is not controlled by your nausea medication, call the clinic.   BELOW ARE SYMPTOMS THAT SHOULD BE REPORTED IMMEDIATELY:  *FEVER GREATER THAN 100.5 F  *CHILLS WITH OR WITHOUT FEVER  NAUSEA AND VOMITING THAT IS NOT CONTROLLED WITH YOUR NAUSEA MEDICATION  *UNUSUAL SHORTNESS OF BREATH  *UNUSUAL BRUISING OR BLEEDING  TENDERNESS IN MOUTH AND THROAT WITH OR WITHOUT PRESENCE OF ULCERS  *URINARY PROBLEMS  *BOWEL PROBLEMS  UNUSUAL RASH Items with * indicate a potential emergency and should be followed up as soon as possible.  Feel free to call the clinic you have any questions or concerns. The clinic phone number is [phone].  Please show the CHEMO ALERT CARD at check-in to the Emergency Department and triage nurse.   

## 2016-04-25 ENCOUNTER — Encounter: Payer: Self-pay | Admitting: Hematology and Oncology

## 2016-04-25 NOTE — Assessment & Plan Note (Signed)
He has chronic mild peripheral neuropathy but it does not bother him.  I will continue to assess while he is receiving Velcade.

## 2016-04-25 NOTE — Assessment & Plan Note (Signed)
He tolerated chemotherapy well. He has pancytopenic but remained asymptomatic. Denies bone pain. No peripheral neuropathy. We will continue treatment without dose adjustment. Recent myeloma panel show significant partial response with only after 4 doses of treatment I will continue to see him every other week. He will continue acyclovir for antimicrobial prophylaxis and weekly pulsed dexamethasone on Mondays Due to recent GERD, I plan to reduce a dose of Solu-Medrol to 40 mg I plan to start dexamethasone taper after his next visit He will take calcium, vitamin D and will receive Zometa every 6 months, next due around August 2018.

## 2016-04-25 NOTE — Assessment & Plan Note (Signed)
He is not symptomatic from anemia or thrombocytopenia. I will continue to hold Velcade and continue on Daratumumab only for now

## 2016-04-25 NOTE — Progress Notes (Signed)
[facility] OFFICE PROGRESS NOTE  Patient Care Team: Renaye Rakers, MD as PCP - General (Family Medicine) Artis Delay, MD as Consulting Physician (Hematology and Oncology) Luella Cook, RN as [facility] Care Management  SUMMARY OF ONCOLOGIC HISTORY:   Multiple myeloma in relapse ([facility])   12/12/2010 Initial Diagnosis    Multiple myeloma      02/16/2014 Imaging    Skeletal survey showed diffuse osteopenia      03/02/2014 Bone Marrow Biopsy    Accession: [id number] BM biopsy showed only 5 % plasma cells. However, the biopsy was difficult and the bone was fragmented during the procedure. Cytogenetics and FISH study is normal      03/03/2014 Procedure    he has port placement      03/10/2014 - 05/19/2014 Chemotherapy    he received elotuzumab and revlimid      05/20/2014 - 02/25/2016 Chemotherapy    Treatment is switched to maintenance Revlimid only. Treatment is stopped due to progressive disease      11/15/2014 Adverse Reaction    He has mild worsening anemia. Does of Revlimid reduced to 5 mg 21 days on, 7 days off      03/06/2016 -  Chemotherapy    He received Daratumumab and Velcade       INTERVAL HISTORY: Please see below for problem oriented charting. He is seen for further follow-up He tolerated chemotherapy well. Denies recent infection No worsening peripheral neuropathy No new bone pain. The patient denies any recent signs or symptoms of bleeding such as spontaneous epistaxis, hematuria or hematochezia.  REVIEW OF SYSTEMS:   Constitutional: Denies fevers, chills or abnormal weight loss Eyes: Denies blurriness of vision Ears, nose, mouth, throat, and face: Denies mucositis or sore throat Respiratory: Denies cough, dyspnea or wheezes Cardiovascular: Denies palpitation, chest discomfort or lower extremity swelling Gastrointestinal:  Denies nausea, heartburn or change in bowel habits Skin: Denies abnormal skin rashes Lymphatics: Denies  new lymphadenopathy or easy bruising Neurological:Denies numbness, tingling or new weaknesses Behavioral/Psych: Mood is stable, no new changes  All other systems were reviewed with the patient and are negative.  I have reviewed the past medical history, past surgical history, social history and family history with the patient and they are unchanged from previous note.  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  MEDICATIONS:  Current Outpatient Prescriptions  Medication Sig Dispense Refill  . acyclovir (ZOVIRAX) 400 MG tablet Take 1 tablet (400 mg total) by mouth daily. 60 tablet 11  . allopurinol (ZYLOPRIM) 100 MG tablet Take 100 mg by mouth 2 (two) times daily.     Marland Kitchen amoxicillin-clavulanate (AUGMENTIN) 875-125 MG tablet Take 1 tablet by mouth every 12 (twelve) hours. 6 tablet 0  . aspirin EC 81 MG tablet Take 81 mg by mouth daily.    Marland Kitchen atorvastatin (LIPITOR) 20 MG tablet Take 20 mg by mouth at bedtime.     . brimonidine (ALPHAGAN) 0.15 % ophthalmic solution Place 1 drop into the right eye 3 (three) times daily.    . cholecalciferol (VITAMIN D) 1000 UNITS tablet Take 1,000 Units by mouth daily.    Marland Kitchen dexamethasone (DECADRON) 4 MG tablet Take 5 tablets every Mondays 20 tablet 4  . dorzolamide-timolol (COSOPT) 22.3-6.8 MG/ML ophthalmic solution Place 1 drop into both eyes 2 (two) times daily.     . fluorometholone (FML) 0.1 % ophthalmic suspension Place 1 drop into both eyes daily.    Marland Kitchen latanoprost (XALATAN) 0.005 % ophthalmic solution Place  1 drop into the right eye at bedtime.    . lidocaine-prilocaine (EMLA) cream Apply 1 application topically as needed. 30 g 1  . lidocaine-prilocaine (EMLA) cream Apply to affected area once 30 g 3  . loperamide (IMODIUM A-D) 2 MG tablet 1 po after each watery BM.  Maximum 6 per 24hrs. 30 tablet PRN  . ondansetron (ZOFRAN) 8 MG tablet Take 1 tablet (8 mg total) by mouth 2 (two) times daily as needed (Nausea or vomiting). 30 tablet 1  .  prochlorperazine (COMPAZINE) 10 MG tablet Take 1 tablet (10 mg total) by mouth every 6 (six) hours as needed (Nausea or vomiting). 30 tablet 1   No current facility-administered medications for this visit.     PHYSICAL EXAMINATION: ECOG PERFORMANCE STATUS: 1 - Symptomatic but completely ambulatory  Vitals:   04/24/16 0905  BP: 115/64  Pulse: 60  Resp: 18  Temp: 98.4 F (36.9 C)   There were no vitals filed for this visit.  GENERAL:alert, no distress and comfortable SKIN: skin color, texture, turgor are normal, no rashes or significant lesions EYES: normal, Conjunctiva are pink and non-injected, sclera clear OROPHARYNX:no exudate, no erythema and lips, buccal mucosa, and tongue normal  NECK: supple, thyroid normal size, non-tender, without nodularity LYMPH:  no palpable lymphadenopathy in the cervical, axillary or inguinal LUNGS: clear to auscultation and percussion with normal breathing effort HEART: regular rate & rhythm and no murmurs and no lower extremity edema ABDOMEN:abdomen soft, non-tender and normal bowel sounds Musculoskeletal:no cyanosis of digits and no clubbing  NEURO: alert & oriented x 3 with fluent speech, no focal motor/sensory deficits  LABORATORY DATA:  I have reviewed the data as listed    Component Value Date/Time   NA 133 (L) 04/24/2016 0737   K 4.2 04/24/2016 0737   CL 108 04/10/2015 0405   CL 107 06/29/2012 1131   CO2 20 (L) 04/24/2016 0737   GLUCOSE 165 (H) 04/24/2016 0737   GLUCOSE 143 (H) 06/29/2012 1131   BUN 24.4 04/24/2016 0737   CREATININE 1.7 (H) 04/24/2016 0737   CALCIUM 8.8 04/24/2016 0737   PROT 6.2 (L) 04/24/2016 0737   ALBUMIN 2.9 (L) 04/24/2016 0737   AST 19 04/24/2016 0737   ALT 41 04/24/2016 0737   ALKPHOS 71 04/24/2016 0737   BILITOT 0.56 04/24/2016 0737   GFRNONAA 38 (L) 04/10/2015 0405   GFRAA 44 (L) 04/10/2015 0405    No results found for: SPEP, UPEP  Lab Results  Component Value Date   WBC 7.0 04/24/2016    NEUTROABS 5.4 04/24/2016   HGB 8.3 (L) 04/24/2016   HCT 24.2 (L) 04/24/2016   MCV 88.6 04/24/2016   PLT 77 (L) 04/24/2016      Chemistry      Component Value Date/Time   NA 133 (L) 04/24/2016 0737   K 4.2 04/24/2016 0737   CL 108 04/10/2015 0405   CL 107 06/29/2012 1131   CO2 20 (L) 04/24/2016 0737   BUN 24.4 04/24/2016 0737   CREATININE 1.7 (H) 04/24/2016 0737      Component Value Date/Time   CALCIUM 8.8 04/24/2016 0737   ALKPHOS 71 04/24/2016 0737   AST 19 04/24/2016 0737   ALT 41 04/24/2016 0737   BILITOT 0.56 04/24/2016 0737       ASSESSMENT & PLAN:  Multiple myeloma in relapse ([facility]) He tolerated chemotherapy well. He has pancytopenic but remained asymptomatic. Denies bone pain. No peripheral neuropathy. We will continue treatment without dose adjustment. Recent myeloma panel  show significant partial response with only after 4 doses of treatment I will continue to see him every other week. He will continue acyclovir for antimicrobial prophylaxis and weekly pulsed dexamethasone on Mondays Due to recent GERD, I plan to reduce a dose of Solu-Medrol to 40 mg I plan to start dexamethasone taper after his next visit He will take calcium, vitamin D and will receive Zometa every 6 months, next due around August 2018.  Pancytopenia, acquired ([place]) He is not symptomatic from anemia or thrombocytopenia. I will continue to hold Velcade and continue on Daratumumab only for now  Chronic renal insufficiency, stage III (moderate) This is chronic in nature. It is stable. Recommend observation only. We'll give him a reduced dose Zometa twice a year due to chronic renal failure He does not require dose adjustment for his chemotherapy.   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  I will continue to assess while he is receiving Velcade.   No orders of the defined types were placed in this encounter.  All  questions were answered. The patient knows to call the clinic with any problems, questions or concerns. No barriers to learning was detected. I spent 15 minutes counseling the patient face to face. The total time spent in the appointment was 20 minutes and more than 50% was on counseling and review of test results     Heath Lark, MD 04/25/2016 8:19 AM

## 2016-04-25 NOTE — Assessment & Plan Note (Signed)
This is chronic in nature. It is stable. Recommend observation only. We'll give him a reduced dose Zometa twice a year due to chronic renal failure He does not require dose adjustment for his chemotherapy.  
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
## 2016-05-01 ENCOUNTER — Ambulatory Visit (HOSPITAL_BASED_OUTPATIENT_CLINIC_OR_DEPARTMENT_OTHER): Payer: Medicare Other

## 2016-05-01 ENCOUNTER — Other Ambulatory Visit (HOSPITAL_BASED_OUTPATIENT_CLINIC_OR_DEPARTMENT_OTHER): Payer: Medicare Other

## 2016-05-01 VITALS — BP 121/68 | HR 64 | Temp 98.4°F | Resp 17

## 2016-05-01 DIAGNOSIS — C9002 Multiple myeloma in relapse: Secondary | ICD-10-CM | POA: Diagnosis not present

## 2016-05-01 DIAGNOSIS — Z5112 Encounter for antineoplastic immunotherapy: Secondary | ICD-10-CM | POA: Diagnosis not present

## 2016-05-01 DIAGNOSIS — C9001 Multiple myeloma in remission: Secondary | ICD-10-CM

## 2016-05-01 LAB — COMPREHENSIVE METABOLIC PANEL
ALBUMIN: 3.2 g/dL — AB (ref 3.5–5.0)
ALK PHOS: 76 U/L (ref 40–150)
ALT: 29 U/L (ref 0–55)
AST: 15 U/L (ref 5–34)
Anion Gap: 10 mEq/L (ref 3–11)
BILIRUBIN TOTAL: 0.57 mg/dL (ref 0.20–1.20)
BUN: 29.6 mg/dL — AB (ref 7.0–26.0)
CALCIUM: 9.3 mg/dL (ref 8.4–10.4)
CO2: 24 mEq/L (ref 22–29)
Chloride: 98 mEq/L (ref 98–109)
Creatinine: 1.8 mg/dL — ABNORMAL HIGH (ref 0.7–1.3)
EGFR: 41 mL/min/{1.73_m2} — ABNORMAL LOW (ref >90)
Glucose: 137 mg/dl (ref 70–140)
Potassium: 4.2 mEq/L (ref 3.5–5.1)
Sodium: 132 mEq/L — ABNORMAL LOW (ref 136–145)
TOTAL PROTEIN: 6.6 g/dL (ref 6.4–8.3)

## 2016-05-01 LAB — CBC WITH DIFFERENTIAL/PLATELET
BASO%: 0.1 % (ref 0.0–2.0)
BASOS ABS: 0 10*3/uL (ref 0.0–0.1)
EOS ABS: 0 10*3/uL (ref 0.0–0.5)
EOS%: 0 % (ref 0.0–7.0)
HEMATOCRIT: 25.2 % — AB (ref 38.4–49.9)
HEMOGLOBIN: 8.7 g/dL — AB (ref 13.0–17.1)
LYMPH#: 2.4 10*3/uL (ref 0.9–3.3)
LYMPH%: 24.5 % (ref 14.0–49.0)
MCH: 30.1 pg (ref 27.2–33.4)
MCHC: 34.5 g/dL (ref 32.0–36.0)
MCV: 87.2 fL (ref 79.3–98.0)
MONO#: 0.3 10*3/uL (ref 0.1–0.9)
MONO%: 3.2 % (ref 0.0–14.0)
NEUT#: 6.9 10*3/uL — ABNORMAL HIGH (ref 1.5–6.5)
NEUT%: 72.2 % (ref 39.0–75.0)
Platelets: 105 10*3/uL — ABNORMAL LOW (ref 140–400)
RBC: 2.89 10*6/uL — ABNORMAL LOW (ref 4.20–5.82)
RDW: 17.3 % — AB (ref 11.0–14.6)
WBC: 9.6 10*3/uL (ref 4.0–10.3)

## 2016-05-01 MED ORDER — SODIUM CHLORIDE 0.9 % IV SOLN
15.2000 mg/kg | Freq: Once | INTRAVENOUS | Status: AC
  Administered 2016-05-01: 1200 mg via INTRAVENOUS
  Filled 2016-05-01: qty 60

## 2016-05-01 MED ORDER — BORTEZOMIB CHEMO SQ INJECTION 3.5 MG (2.5MG/ML)
1.3000 mg/m2 | Freq: Once | INTRAMUSCULAR | Status: AC
  Administered 2016-05-01: 2.5 mg via SUBCUTANEOUS
  Filled 2016-05-01: qty 2.5

## 2016-05-01 MED ORDER — DIPHENHYDRAMINE HCL 25 MG PO CAPS
ORAL_CAPSULE | ORAL | Status: AC
  Filled 2016-05-01: qty 2

## 2016-05-01 MED ORDER — HEPARIN SOD (PORK) LOCK FLUSH 100 UNIT/ML IV SOLN
500.0000 [IU] | Freq: Once | INTRAVENOUS | Status: AC | PRN
Start: 2016-05-01 — End: 2016-05-01
  Administered 2016-05-01: 500 [IU]
  Filled 2016-05-01: qty 5

## 2016-05-01 MED ORDER — METHYLPREDNISOLONE SODIUM SUCC 40 MG IJ SOLR
40.0000 mg | Freq: Once | INTRAMUSCULAR | Status: AC
  Administered 2016-05-01: 40 mg via INTRAVENOUS

## 2016-05-01 MED ORDER — METHYLPREDNISOLONE SODIUM SUCC 40 MG IJ SOLR
INTRAMUSCULAR | Status: AC
  Filled 2016-05-01: qty 1

## 2016-05-01 MED ORDER — ACETAMINOPHEN 325 MG PO TABS
ORAL_TABLET | ORAL | Status: AC
  Filled 2016-05-01: qty 2

## 2016-05-01 MED ORDER — PROCHLORPERAZINE MALEATE 10 MG PO TABS
ORAL_TABLET | ORAL | Status: AC
  Filled 2016-05-01: qty 1

## 2016-05-01 MED ORDER — PROCHLORPERAZINE MALEATE 10 MG PO TABS
10.0000 mg | ORAL_TABLET | Freq: Once | ORAL | Status: AC
  Administered 2016-05-01: 10 mg via ORAL

## 2016-05-01 MED ORDER — SODIUM CHLORIDE 0.9 % IV SOLN
Freq: Once | INTRAVENOUS | Status: AC
  Administered 2016-05-01: 08:00:00 via INTRAVENOUS

## 2016-05-01 MED ORDER — DIPHENHYDRAMINE HCL 25 MG PO CAPS
50.0000 mg | ORAL_CAPSULE | Freq: Once | ORAL | Status: AC
  Administered 2016-05-01: 50 mg via ORAL

## 2016-05-01 MED ORDER — ACETAMINOPHEN 325 MG PO TABS
650.0000 mg | ORAL_TABLET | Freq: Once | ORAL | Status: AC
  Administered 2016-05-01: 650 mg via ORAL

## 2016-05-01 MED ORDER — SODIUM CHLORIDE 0.9% FLUSH
10.0000 mL | INTRAVENOUS | Status: DC | PRN
  Administered 2016-05-01: 10 mL
  Filled 2016-05-01: qty 10

## 2016-05-01 NOTE — Progress Notes (Signed)
Per MD OK to treat with CR of 1.8.  Wylene Simmer, BSN, RN 05/01/2016 9:07 AM

## 2016-05-01 NOTE — Patient Instructions (Signed)
[facility] Discharge Instructions for Patients Receiving Chemotherapy  Today you received the following chemotherapy agents Darzalex and Velcade   To help prevent nausea and vomiting after your treatment, we encourage you to take your nausea medication as directed.    If you develop nausea and vomiting that is not controlled by your nausea medication, call the clinic.   BELOW ARE SYMPTOMS THAT SHOULD BE REPORTED IMMEDIATELY:  *FEVER GREATER THAN 100.5 F  *CHILLS WITH OR WITHOUT FEVER  NAUSEA AND VOMITING THAT IS NOT CONTROLLED WITH YOUR NAUSEA MEDICATION  *UNUSUAL SHORTNESS OF BREATH  *UNUSUAL BRUISING OR BLEEDING  TENDERNESS IN MOUTH AND THROAT WITH OR WITHOUT PRESENCE OF ULCERS  *URINARY PROBLEMS  *BOWEL PROBLEMS  UNUSUAL RASH Items with * indicate a potential emergency and should be followed up as soon as possible.  Feel free to call the clinic you have any questions or concerns. The clinic phone number is [phone].  Please show the CHEMO ALERT CARD at check-in to the Emergency Department and triage nurse.   

## 2016-05-02 LAB — KAPPA/LAMBDA LIGHT CHAINS
Ig Kappa Free Light Chain: 57 mg/L — ABNORMAL HIGH (ref 3.3–19.4)
Ig Lambda Free Light Chain: 1.9 mg/L — ABNORMAL LOW (ref 5.7–26.3)
Kappa/Lambda FluidC Ratio: 30 — ABNORMAL HIGH (ref 0.26–1.65)

## 2016-05-06 DIAGNOSIS — H4051X2 Glaucoma secondary to other eye disorders, right eye, moderate stage: Secondary | ICD-10-CM | POA: Diagnosis not present

## 2016-05-08 LAB — MULTIPLE MYELOMA PANEL, SERUM
ALPHA 1: 0.3 g/dL (ref 0.0–0.4)
ALPHA2 GLOB SERPL ELPH-MCNC: 0.8 g/dL (ref 0.4–1.0)
Albumin SerPl Elph-Mcnc: 2.9 g/dL (ref 2.9–4.4)
Albumin/Glob SerPl: 0.9 (ref 0.7–1.7)
B-Globulin SerPl Elph-Mcnc: 1.2 g/dL (ref 0.7–1.3)
Gamma Glob SerPl Elph-Mcnc: 1 g/dL (ref 0.4–1.8)
Globulin, Total: 3.3 g/dL (ref 2.2–3.9)
IGM (IMMUNOGLOBIN M), SRM: 7 mg/dL — AB (ref 15–143)
M Protein SerPl Elph-Mcnc: 0.8 g/dL — ABNORMAL HIGH
Total Protein: 6.2 g/dL (ref 6.0–8.5)

## 2016-05-22 ENCOUNTER — Ambulatory Visit (HOSPITAL_BASED_OUTPATIENT_CLINIC_OR_DEPARTMENT_OTHER): Payer: Medicare Other | Admitting: Hematology and Oncology

## 2016-05-22 ENCOUNTER — Encounter: Payer: Self-pay | Admitting: Hematology and Oncology

## 2016-05-22 ENCOUNTER — Ambulatory Visit: Payer: Medicare Other

## 2016-05-22 ENCOUNTER — Telehealth: Payer: Self-pay | Admitting: *Deleted

## 2016-05-22 ENCOUNTER — Other Ambulatory Visit (HOSPITAL_BASED_OUTPATIENT_CLINIC_OR_DEPARTMENT_OTHER): Payer: Medicare Other

## 2016-05-22 ENCOUNTER — Ambulatory Visit (HOSPITAL_BASED_OUTPATIENT_CLINIC_OR_DEPARTMENT_OTHER): Payer: Medicare Other

## 2016-05-22 VITALS — BP 115/60 | HR 61 | Temp 97.5°F | Resp 16

## 2016-05-22 DIAGNOSIS — N183 Chronic kidney disease, stage 3 unspecified: Secondary | ICD-10-CM

## 2016-05-22 DIAGNOSIS — R2681 Unsteadiness on feet: Secondary | ICD-10-CM | POA: Diagnosis not present

## 2016-05-22 DIAGNOSIS — Z95828 Presence of other vascular implants and grafts: Secondary | ICD-10-CM

## 2016-05-22 DIAGNOSIS — C9002 Multiple myeloma in relapse: Secondary | ICD-10-CM

## 2016-05-22 DIAGNOSIS — C9001 Multiple myeloma in remission: Secondary | ICD-10-CM | POA: Diagnosis not present

## 2016-05-22 DIAGNOSIS — D61818 Other pancytopenia: Secondary | ICD-10-CM

## 2016-05-22 DIAGNOSIS — R5381 Other malaise: Secondary | ICD-10-CM | POA: Diagnosis not present

## 2016-05-22 DIAGNOSIS — Z5112 Encounter for antineoplastic immunotherapy: Secondary | ICD-10-CM | POA: Diagnosis not present

## 2016-05-22 DIAGNOSIS — T451X5A Adverse effect of antineoplastic and immunosuppressive drugs, initial encounter: Secondary | ICD-10-CM

## 2016-05-22 DIAGNOSIS — G620 Drug-induced polyneuropathy: Secondary | ICD-10-CM

## 2016-05-22 LAB — COMPREHENSIVE METABOLIC PANEL
ALK PHOS: 61 U/L (ref 40–150)
ALT: 22 U/L (ref 0–55)
AST: 14 U/L (ref 5–34)
Albumin: 3.1 g/dL — ABNORMAL LOW (ref 3.5–5.0)
Anion Gap: 10 mEq/L (ref 3–11)
BUN: 28.9 mg/dL — ABNORMAL HIGH (ref 7.0–26.0)
CALCIUM: 8.3 mg/dL — AB (ref 8.4–10.4)
CO2: 21 meq/L — AB (ref 22–29)
Chloride: 104 mEq/L (ref 98–109)
Creatinine: 1.6 mg/dL — ABNORMAL HIGH (ref 0.7–1.3)
EGFR: 49 mL/min/{1.73_m2} — ABNORMAL LOW (ref >90)
GLUCOSE: 120 mg/dL (ref 70–140)
Potassium: 3.9 mEq/L (ref 3.5–5.1)
Sodium: 135 mEq/L — ABNORMAL LOW (ref 136–145)
TOTAL PROTEIN: 6.3 g/dL — AB (ref 6.4–8.3)
Total Bilirubin: 0.39 mg/dL (ref 0.20–1.20)

## 2016-05-22 LAB — CBC WITH DIFFERENTIAL/PLATELET
BASO%: 0.1 % (ref 0.0–2.0)
Basophils Absolute: 0 10*3/uL (ref 0.0–0.1)
EOS%: 0 % (ref 0.0–7.0)
Eosinophils Absolute: 0 10*3/uL (ref 0.0–0.5)
HEMATOCRIT: 25.7 % — AB (ref 38.4–49.9)
HGB: 8.6 g/dL — ABNORMAL LOW (ref 13.0–17.1)
LYMPH#: 1.8 10*3/uL (ref 0.9–3.3)
LYMPH%: 16.1 % (ref 14.0–49.0)
MCH: 31.2 pg (ref 27.2–33.4)
MCHC: 33.6 g/dL (ref 32.0–36.0)
MCV: 92.8 fL (ref 79.3–98.0)
MONO#: 0.7 10*3/uL (ref 0.1–0.9)
MONO%: 6.5 % (ref 0.0–14.0)
NEUT%: 77.3 % — AB (ref 39.0–75.0)
NEUTROS ABS: 8.7 10*3/uL — AB (ref 1.5–6.5)
Platelets: 128 10*3/uL — ABNORMAL LOW (ref 140–400)
RBC: 2.77 10*6/uL — AB (ref 4.20–5.82)
RDW: 20 % — ABNORMAL HIGH (ref 11.0–14.6)
WBC: 11.3 10*3/uL — ABNORMAL HIGH (ref 4.0–10.3)

## 2016-05-22 MED ORDER — METHYLPREDNISOLONE SODIUM SUCC 40 MG IJ SOLR
INTRAMUSCULAR | Status: AC
  Filled 2016-05-22: qty 1

## 2016-05-22 MED ORDER — ALTEPLASE 2 MG IJ SOLR
2.0000 mg | Freq: Once | INTRAMUSCULAR | Status: DC | PRN
  Filled 2016-05-22: qty 2

## 2016-05-22 MED ORDER — ACETAMINOPHEN 325 MG PO TABS
ORAL_TABLET | ORAL | Status: AC
  Filled 2016-05-22: qty 2

## 2016-05-22 MED ORDER — PROCHLORPERAZINE MALEATE 10 MG PO TABS
ORAL_TABLET | ORAL | Status: AC
  Filled 2016-05-22: qty 1

## 2016-05-22 MED ORDER — HEPARIN SOD (PORK) LOCK FLUSH 100 UNIT/ML IV SOLN
500.0000 [IU] | Freq: Once | INTRAVENOUS | Status: DC
  Filled 2016-05-22: qty 5

## 2016-05-22 MED ORDER — HEPARIN SOD (PORK) LOCK FLUSH 100 UNIT/ML IV SOLN
500.0000 [IU] | Freq: Once | INTRAVENOUS | Status: DC | PRN
  Filled 2016-05-22: qty 5

## 2016-05-22 MED ORDER — SODIUM CHLORIDE 0.9 % IV SOLN
15.2000 mg/kg | Freq: Once | INTRAVENOUS | Status: AC
  Administered 2016-05-22: 1200 mg via INTRAVENOUS
  Filled 2016-05-22: qty 60

## 2016-05-22 MED ORDER — METHYLPREDNISOLONE SODIUM SUCC 40 MG IJ SOLR
40.0000 mg | Freq: Once | INTRAMUSCULAR | Status: AC
  Administered 2016-05-22: 40 mg via INTRAVENOUS

## 2016-05-22 MED ORDER — PROCHLORPERAZINE MALEATE 10 MG PO TABS
10.0000 mg | ORAL_TABLET | Freq: Once | ORAL | Status: AC
  Administered 2016-05-22: 10 mg via ORAL

## 2016-05-22 MED ORDER — SODIUM CHLORIDE 0.9% FLUSH
10.0000 mL | INTRAVENOUS | Status: DC | PRN
  Filled 2016-05-22: qty 10

## 2016-05-22 MED ORDER — DIPHENHYDRAMINE HCL 25 MG PO CAPS
ORAL_CAPSULE | ORAL | Status: AC
  Filled 2016-05-22: qty 2

## 2016-05-22 MED ORDER — SODIUM CHLORIDE 0.9 % IV SOLN
Freq: Once | INTRAVENOUS | Status: AC
  Administered 2016-05-22: 10:00:00 via INTRAVENOUS

## 2016-05-22 MED ORDER — HEPARIN SOD (PORK) LOCK FLUSH 100 UNIT/ML IV SOLN
500.0000 [IU] | Freq: Once | INTRAVENOUS | Status: AC | PRN
  Administered 2016-05-22: 500 [IU]
  Filled 2016-05-22: qty 5

## 2016-05-22 MED ORDER — SODIUM CHLORIDE 0.9% FLUSH
10.0000 mL | INTRAVENOUS | Status: DC | PRN
  Administered 2016-05-22: 10 mL
  Filled 2016-05-22: qty 10

## 2016-05-22 MED ORDER — ACETAMINOPHEN 325 MG PO TABS
650.0000 mg | ORAL_TABLET | Freq: Once | ORAL | Status: AC
  Administered 2016-05-22: 650 mg via ORAL

## 2016-05-22 MED ORDER — SODIUM CHLORIDE 0.9 % IV SOLN: Freq: Once | INTRAVENOUS | Status: DC

## 2016-05-22 MED ORDER — SODIUM CHLORIDE 0.9% FLUSH
3.0000 mL | Freq: Once | INTRAVENOUS | Status: DC | PRN
  Filled 2016-05-22: qty 10

## 2016-05-22 MED ORDER — DIPHENHYDRAMINE HCL 25 MG PO CAPS
50.0000 mg | ORAL_CAPSULE | Freq: Once | ORAL | Status: AC
  Administered 2016-05-22: 50 mg via ORAL

## 2016-05-22 MED ORDER — BORTEZOMIB CHEMO SQ INJECTION 3.5 MG (2.5MG/ML)
1.3000 mg/m2 | Freq: Once | INTRAMUSCULAR | Status: AC
  Administered 2016-05-22: 2.5 mg via SUBCUTANEOUS
  Filled 2016-05-22: qty 2.5

## 2016-05-22 MED ORDER — HEPARIN SOD (PORK) LOCK FLUSH 100 UNIT/ML IV SOLN
250.0000 [IU] | Freq: Once | INTRAVENOUS | Status: DC | PRN
  Filled 2016-05-22: qty 5

## 2016-05-22 MED ORDER — SODIUM CHLORIDE 0.9% FLUSH
10.0000 mL | Freq: Once | INTRAVENOUS | Status: AC
  Administered 2016-05-22: 10 mL
  Filled 2016-05-22: qty 10

## 2016-05-22 MED ORDER — PROCHLORPERAZINE MALEATE 10 MG PO TABS: 10.0000 mg | ORAL_TABLET | Freq: Once | ORAL | Status: DC

## 2016-05-22 NOTE — Assessment & Plan Note (Signed)
He has chronic mild peripheral neuropathy but it does not bother him.  I will continue to assess while he is receiving Velcade.

## 2016-05-22 NOTE — Progress Notes (Signed)
[place] OFFICE PROGRESS NOTE  Patient Care Team: Lucianne Lei, MD as PCP - General (Family Medicine) Heath Lark, MD as Consulting Physician (Hematology and Oncology) Pleasant, [name], RN as [place] [facility]  SUMMARY OF ONCOLOGIC HISTORY:   Multiple myeloma in relapse [facility])   12/12/2010 Initial Diagnosis    Multiple myeloma      02/16/2014 Imaging    Skeletal survey showed diffuse osteopenia      03/02/2014 Bone Marrow Biopsy    Accession: [id number] biopsy showed only 5 % plasma cells. However, the biopsy was difficult and the bone was fragmented during the procedure. Cytogenetics and FISH study is normal      03/03/2014 Procedure    he has port placement      03/10/2014 - 05/19/2014 Chemotherapy    he received elotuzumab and revlimid      05/20/2014 - 02/25/2016 Chemotherapy    Treatment is switched to maintenance Revlimid only. Treatment is stopped due to progressive disease      11/15/2014 Adverse Reaction    He has mild worsening anemia. Does of Revlimid reduced to 5 mg 21 days on, 7 days off      03/06/2016 -  Chemotherapy    He received Daratumumab and Velcade       INTERVAL HISTORY: Please see below for problem oriented charting. He is seen in the infusion room. His wife told me that the patient is getting quite weak and having difficulties getting in and out of shower. Denies recent fall He denies bone pain Denies recent infection No worsening peripheral neuropathy The patient denies any recent signs or symptoms of bleeding such as spontaneous epistaxis, hematuria or hematochezia.   REVIEW OF SYSTEMS:   Constitutional: Denies fevers, chills or abnormal weight loss Eyes: Denies blurriness of vision Ears, nose, mouth, throat, and face: Denies mucositis or sore throat Respiratory: Denies cough, dyspnea or wheezes Cardiovascular: Denies palpitation, chest discomfort or lower extremity swelling Gastrointestinal:   Denies nausea, heartburn or change in bowel habits Skin: Denies abnormal skin rashes Lymphatics: Denies new lymphadenopathy or easy bruising Neurological:Denies numbness, tingling or new weaknesses Behavioral/Psych: Mood is stable, no new changes  All other systems were reviewed with the patient and are negative.  I have reviewed the past medical history, past surgical history, social history and family history with the patient and they are unchanged from previous note.  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  MEDICATIONS:  Current Outpatient Prescriptions  Medication Sig Dispense Refill  . acyclovir (ZOVIRAX) 400 MG tablet Take 1 tablet (400 mg total) by mouth daily. 60 tablet 11  . allopurinol (ZYLOPRIM) 100 MG tablet Take 100 mg by mouth 2 (two) times daily.     Marland Kitchen amoxicillin-clavulanate (AUGMENTIN) 875-125 MG tablet Take 1 tablet by mouth every 12 (twelve) hours. 6 tablet 0  . aspirin EC 81 MG tablet Take 81 mg by mouth daily.    Marland Kitchen atorvastatin (LIPITOR) 20 MG tablet Take 20 mg by mouth at bedtime.     . brimonidine (ALPHAGAN) 0.15 % ophthalmic solution Place 1 drop into the right eye 3 (three) times daily.    . cholecalciferol (VITAMIN D) 1000 UNITS tablet Take 1,000 Units by mouth daily.    Marland Kitchen dexamethasone (DECADRON) 4 MG tablet Take 5 tablets every Mondays 20 tablet 4  . dorzolamide-timolol (COSOPT) 22.3-6.8 MG/ML ophthalmic solution Place 1 drop into both eyes 2 (two) times daily.     . fluorometholone (FML) 0.1 %  ophthalmic suspension Place 1 drop into both eyes daily.    Marland Kitchen latanoprost (XALATAN) 0.005 % ophthalmic solution Place 1 drop into the right eye at bedtime.    . lidocaine-prilocaine (EMLA) cream Apply 1 application topically as needed. 30 g 1  . lidocaine-prilocaine (EMLA) cream Apply to affected area once 30 g 3  . loperamide (IMODIUM A-D) 2 MG tablet 1 po after each watery BM.  Maximum 6 per 24hrs. 30 tablet PRN  . ondansetron (ZOFRAN) 8 MG tablet Take 1  tablet (8 mg total) by mouth 2 (two) times daily as needed (Nausea or vomiting). 30 tablet 1  . prochlorperazine (COMPAZINE) 10 MG tablet Take 1 tablet (10 mg total) by mouth every 6 (six) hours as needed (Nausea or vomiting). 30 tablet 1   No current facility-administered medications for this visit.    Facility-Administered Medications Ordered in Other Visits  Medication Dose Route Frequency Provider Last Rate Last Dose  . bortezomib SQ (VELCADE) chemo injection 2.5 mg  1.3 mg/m2 (Treatment Plan Recorded) Subcutaneous Once Alvy Bimler, Natori Gudino, MD      . daratumumab (DARZALEX) 1,200 mg in sodium chloride 0.9 % 440 mL (2.4 mg/mL) chemo infusion  15.2 mg/kg (Treatment Plan Recorded) Intravenous Once Alvy Bimler, Krishna Dancel, MD      . heparin lock flush 100 unit/mL  500 Units Intracatheter Once PRN Alvy Bimler, Taelynn Mcelhannon, MD      . sodium chloride flush (NS) 0.9 % injection 10 mL  10 mL Intracatheter PRN Nichael Ehly, MD        PHYSICAL EXAMINATION: ECOG PERFORMANCE STATUS: 2 - Symptomatic, <50% confined to bed  There were no vitals filed for this visit. There were no vitals filed for this visit.  GENERAL:alert, no distress and comfortable SKIN: skin color, texture, turgor are normal, no rashes or significant lesions EYES: normal, Conjunctiva are pink and non-injected, sclera clear OROPHARYNX:no exudate, no erythema and lips, buccal mucosa, and tongue normal  NECK: supple, thyroid normal size, non-tender, without nodularity LYMPH:  no palpable lymphadenopathy in the cervical, axillary or inguinal LUNGS: clear to auscultation and percussion with normal breathing effort HEART: regular rate & rhythm and no murmurs and no lower extremity edema ABDOMEN:abdomen soft, non-tender and normal bowel sounds Musculoskeletal:no cyanosis of digits and no clubbing  NEURO: alert & oriented x 3 with fluent speech, no focal motor/sensory deficits  LABORATORY DATA:  I have reviewed the data as listed    Component Value Date/Time   NA  135 (L) 05/22/2016 0800   K 3.9 05/22/2016 0800   CL 108 04/10/2015 0405   CL 107 06/29/2012 1131   CO2 21 (L) 05/22/2016 0800   GLUCOSE 120 05/22/2016 0800   GLUCOSE 143 (H) 06/29/2012 1131   BUN 28.9 (H) 05/22/2016 0800   CREATININE 1.6 (H) 05/22/2016 0800   CALCIUM 8.3 (L) 05/22/2016 0800   PROT 6.3 (L) 05/22/2016 0800   ALBUMIN 3.1 (L) 05/22/2016 0800   AST 14 05/22/2016 0800   ALT 22 05/22/2016 0800   ALKPHOS 61 05/22/2016 0800   BILITOT 0.39 05/22/2016 0800   GFRNONAA 38 (L) 04/10/2015 0405   GFRAA 44 (L) 04/10/2015 0405    No results found for: SPEP, UPEP  Lab Results  Component Value Date   WBC 11.3 (H) 05/22/2016   NEUTROABS 8.7 (H) 05/22/2016   HGB 8.6 (L) 05/22/2016   HCT 25.7 (L) 05/22/2016   MCV 92.8 05/22/2016   PLT 128 (L) 05/22/2016      Chemistry  Component Value Date/Time   NA 135 (L) 05/22/2016 0800   K 3.9 05/22/2016 0800   CL 108 04/10/2015 0405   CL 107 06/29/2012 1131   CO2 21 (L) 05/22/2016 0800   BUN 28.9 (H) 05/22/2016 0800   CREATININE 1.6 (H) 05/22/2016 0800      Component Value Date/Time   CALCIUM 8.3 (L) 05/22/2016 0800   ALKPHOS 61 05/22/2016 0800   AST 14 05/22/2016 0800   ALT 22 05/22/2016 0800   BILITOT 0.39 05/22/2016 0800      ASSESSMENT & PLAN:  Multiple myeloma in relapse ([place]) He tolerated chemotherapy well. He has pancytopenic but remained asymptomatic. Denies bone pain. No peripheral neuropathy. We will continue treatment without dose adjustment. Recent myeloma panel show significant partial response with treatment I will continue to see him treatment. He will continue acyclovir for antimicrobial prophylaxis and weekly pulsed dexamethasone on Mondays Due to recent GERD, I plan to reduce a dose of Solu-Medrol to 40 mg I plan to start dexamethasone taper after his next visit; he is instructed to reduce dexamethasone to 16 mg once a week He will take calcium, vitamin D and will receive Zometa every 6 months,  next due around August 2018.  Pancytopenia, acquired ([place]) He is not symptomatic from anemia or thrombocytopenia. I will continue to treatment without dose adjustment  Chronic renal insufficiency, stage III (moderate) This is chronic in nature. It is stable. Recommend observation only. We'll give him a reduced dose Zometa twice a year due to chronic renal failure He does not require dose adjustment for his chemotherapy.   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  I will continue to assess while he is receiving Velcade.  Physical debility The patient is very debilitated at home due to weakness. His wife is concerned he may not be able to get in and out of his shower 12. I recommend advance home care with nursing staff to do an assessment and also physical therapy for home therapy. Given his poor mobility, poor visions, chronic peripheral neuropathy and others, I think is reasonable to get advanced home care for home therapy.   Orders Placed This Encounter  Procedures  . Ambulatory referral to Home Health    Referral Priority:   Routine    Referral Type:   Home Health Care    Referral Reason:   Specialty Services Required    Requested Specialty:   [place]    Number of Visits Requested:   1   All questions were answered. The patient knows to call the clinic with any problems, questions or concerns. No barriers to learning was detected. I spent 20 minutes counseling the patient face to face. The total time spent in the appointment was 30 minutes and more than 50% was on counseling and review of test results     Heath Lark, MD 05/22/2016 10:16 AM

## 2016-05-22 NOTE — Assessment & Plan Note (Signed)
This is chronic in nature. It is stable. Recommend observation only. We'll give him a reduced dose Zometa twice a year due to chronic renal failure He does not require dose adjustment for his chemotherapy.  

## 2016-05-22 NOTE — Patient Instructions (Signed)
[facility] Discharge Instructions for Patients Receiving Chemotherapy  Today you received the following chemotherapy agents Darzalex and Velcade   To help prevent nausea and vomiting after your treatment, we encourage you to take your nausea medication as directed.    If you develop nausea and vomiting that is not controlled by your nausea medication, call the clinic.   BELOW ARE SYMPTOMS THAT SHOULD BE REPORTED IMMEDIATELY:  *FEVER GREATER THAN 100.5 F  *CHILLS WITH OR WITHOUT FEVER  NAUSEA AND VOMITING THAT IS NOT CONTROLLED WITH YOUR NAUSEA MEDICATION  *UNUSUAL SHORTNESS OF BREATH  *UNUSUAL BRUISING OR BLEEDING  TENDERNESS IN MOUTH AND THROAT WITH OR WITHOUT PRESENCE OF ULCERS  *URINARY PROBLEMS  *BOWEL PROBLEMS  UNUSUAL RASH Items with * indicate a potential emergency and should be followed up as soon as possible.  Feel free to call the clinic you have any questions or concerns. The clinic phone number is [phone].  Please show the CHEMO ALERT CARD at check-in to the Emergency Department and triage nurse.   

## 2016-05-22 NOTE — Telephone Encounter (Signed)
Referral called to [facility]. Pt's address [place], [place]  Faxed demo, orders and office notes to [phone]   ([facility] not able to take patient, [facility] states patient is out of their area)

## 2016-05-22 NOTE — Assessment & Plan Note (Signed)
He is not symptomatic from anemia or thrombocytopenia. I will continue to treatment without dose adjustment

## 2016-05-22 NOTE — Assessment & Plan Note (Signed)
He tolerated chemotherapy well. He has pancytopenic but remained asymptomatic. Denies bone pain. No peripheral neuropathy. We will continue treatment without dose adjustment. Recent myeloma panel show significant partial response with treatment I will continue to see him treatment. He will continue acyclovir for antimicrobial prophylaxis and weekly pulsed dexamethasone on Mondays Due to recent GERD, I plan to reduce a dose of Solu-Medrol to 40 mg I plan to start dexamethasone taper after his next visit; he is instructed to reduce dexamethasone to 16 mg once a week He will take calcium, vitamin D and will receive Zometa every 6 months, next due around August 2018.

## 2016-05-22 NOTE — Assessment & Plan Note (Signed)
The patient is very debilitated at home due to weakness. His wife is concerned he may not be able to get in and out of his shower 12. I recommend advance home care with nursing staff to do an assessment and also physical therapy for home therapy. Given his poor mobility, poor visions, chronic peripheral neuropathy and others, I think is reasonable to get advanced home care for home therapy.

## 2016-06-12 ENCOUNTER — Ambulatory Visit (HOSPITAL_BASED_OUTPATIENT_CLINIC_OR_DEPARTMENT_OTHER): Payer: Medicare Other

## 2016-06-12 ENCOUNTER — Other Ambulatory Visit: Payer: Self-pay | Admitting: Hematology and Oncology

## 2016-06-12 ENCOUNTER — Ambulatory Visit (HOSPITAL_BASED_OUTPATIENT_CLINIC_OR_DEPARTMENT_OTHER): Payer: Medicare Other | Admitting: Hematology and Oncology

## 2016-06-12 ENCOUNTER — Ambulatory Visit: Payer: Medicare Other

## 2016-06-12 ENCOUNTER — Other Ambulatory Visit (HOSPITAL_BASED_OUTPATIENT_CLINIC_OR_DEPARTMENT_OTHER): Payer: Medicare Other

## 2016-06-12 ENCOUNTER — Encounter: Payer: Self-pay | Admitting: Hematology and Oncology

## 2016-06-12 VITALS — BP 128/70 | HR 63 | Temp 97.9°F | Resp 16

## 2016-06-12 DIAGNOSIS — N183 Chronic kidney disease, stage 3 unspecified: Secondary | ICD-10-CM

## 2016-06-12 DIAGNOSIS — C9002 Multiple myeloma in relapse: Secondary | ICD-10-CM | POA: Diagnosis not present

## 2016-06-12 DIAGNOSIS — Z5111 Encounter for antineoplastic chemotherapy: Secondary | ICD-10-CM | POA: Diagnosis not present

## 2016-06-12 DIAGNOSIS — D61818 Other pancytopenia: Secondary | ICD-10-CM | POA: Diagnosis not present

## 2016-06-12 DIAGNOSIS — C9001 Multiple myeloma in remission: Secondary | ICD-10-CM

## 2016-06-12 DIAGNOSIS — Z95828 Presence of other vascular implants and grafts: Secondary | ICD-10-CM

## 2016-06-12 LAB — COMPREHENSIVE METABOLIC PANEL WITH GFR
ALT: 15 U/L (ref 0–55)
AST: 16 U/L (ref 5–34)
Albumin: 3 g/dL — ABNORMAL LOW (ref 3.5–5.0)
Alkaline Phosphatase: 71 U/L (ref 40–150)
Anion Gap: 12 meq/L — ABNORMAL HIGH (ref 3–11)
BUN: 14.3 mg/dL (ref 7.0–26.0)
CO2: 20 meq/L — ABNORMAL LOW (ref 22–29)
Calcium: 8.8 mg/dL (ref 8.4–10.4)
Chloride: 103 meq/L (ref 98–109)
Creatinine: 1.8 mg/dL — ABNORMAL HIGH (ref 0.7–1.3)
EGFR: 42 ml/min/1.73 m2 — ABNORMAL LOW (ref >90)
Glucose: 152 mg/dL — ABNORMAL HIGH (ref 70–140)
Potassium: 3.6 meq/L (ref 3.5–5.1)
Sodium: 135 meq/L — ABNORMAL LOW (ref 136–145)
Total Bilirubin: 0.46 mg/dL (ref 0.20–1.20)
Total Protein: 6.6 g/dL (ref 6.4–8.3)

## 2016-06-12 LAB — CBC WITH DIFFERENTIAL/PLATELET
BASO%: 0.4 % (ref 0.0–2.0)
Basophils Absolute: 0 10*3/uL (ref 0.0–0.1)
EOS%: 1 % (ref 0.0–7.0)
Eosinophils Absolute: 0.1 10*3/uL (ref 0.0–0.5)
HCT: 26.9 % — ABNORMAL LOW (ref 38.4–49.9)
HEMOGLOBIN: 8.9 g/dL — AB (ref 13.0–17.1)
LYMPH%: 32.8 % (ref 14.0–49.0)
MCH: 31.5 pg (ref 27.2–33.4)
MCHC: 33.3 g/dL (ref 32.0–36.0)
MCV: 94.6 fL (ref 79.3–98.0)
MONO#: 0.8 10*3/uL (ref 0.1–0.9)
MONO%: 11.2 % (ref 0.0–14.0)
NEUT%: 54.6 % (ref 39.0–75.0)
NEUTROS ABS: 3.7 10*3/uL (ref 1.5–6.5)
Platelets: 116 10*3/uL — ABNORMAL LOW (ref 140–400)
RBC: 2.84 10*6/uL — ABNORMAL LOW (ref 4.20–5.82)
RDW: 18.7 % — AB (ref 11.0–14.6)
WBC: 6.8 10*3/uL (ref 4.0–10.3)
lymph#: 2.2 10*3/uL (ref 0.9–3.3)

## 2016-06-12 MED ORDER — BORTEZOMIB CHEMO SQ INJECTION 3.5 MG (2.5MG/ML)
1.3000 mg/m2 | Freq: Once | INTRAMUSCULAR | Status: AC
  Administered 2016-06-12: 2.5 mg via SUBCUTANEOUS
  Filled 2016-06-12: qty 2.5

## 2016-06-12 MED ORDER — SODIUM CHLORIDE 0.9% FLUSH
10.0000 mL | INTRAVENOUS | Status: DC | PRN
  Administered 2016-06-12: 10 mL
  Filled 2016-06-12: qty 10

## 2016-06-12 MED ORDER — METHYLPREDNISOLONE SODIUM SUCC 40 MG IJ SOLR
INTRAMUSCULAR | Status: AC
  Filled 2016-06-12: qty 1

## 2016-06-12 MED ORDER — ACETAMINOPHEN 325 MG PO TABS
650.0000 mg | ORAL_TABLET | Freq: Once | ORAL | Status: AC
  Administered 2016-06-12: 650 mg via ORAL

## 2016-06-12 MED ORDER — PROCHLORPERAZINE MALEATE 10 MG PO TABS: 10.0000 mg | ORAL_TABLET | Freq: Once | ORAL | Status: DC

## 2016-06-12 MED ORDER — METHYLPREDNISOLONE SODIUM SUCC 40 MG IJ SOLR
40.0000 mg | Freq: Once | INTRAMUSCULAR | Status: AC
  Administered 2016-06-12: 40 mg via INTRAVENOUS

## 2016-06-12 MED ORDER — HEPARIN SOD (PORK) LOCK FLUSH 100 UNIT/ML IV SOLN
500.0000 [IU] | Freq: Once | INTRAVENOUS | Status: AC | PRN
  Administered 2016-06-12: 500 [IU]
  Filled 2016-06-12: qty 5

## 2016-06-12 MED ORDER — DIPHENHYDRAMINE HCL 25 MG PO CAPS
50.0000 mg | ORAL_CAPSULE | Freq: Once | ORAL | Status: AC
  Administered 2016-06-12: 50 mg via ORAL

## 2016-06-12 MED ORDER — SODIUM CHLORIDE 0.9 % IV SOLN
15.3000 mg/kg | Freq: Once | INTRAVENOUS | Status: AC
  Administered 2016-06-12: 1200 mg via INTRAVENOUS
  Filled 2016-06-12: qty 60

## 2016-06-12 MED ORDER — ACETAMINOPHEN 325 MG PO TABS
ORAL_TABLET | ORAL | Status: AC
  Filled 2016-06-12: qty 2

## 2016-06-12 MED ORDER — PROCHLORPERAZINE MALEATE 10 MG PO TABS
10.0000 mg | ORAL_TABLET | Freq: Once | ORAL | Status: AC
  Administered 2016-06-12: 10 mg via ORAL

## 2016-06-12 MED ORDER — SODIUM CHLORIDE 0.9 % IV SOLN
Freq: Once | INTRAVENOUS | Status: AC
  Administered 2016-06-12: 09:00:00 via INTRAVENOUS

## 2016-06-12 MED ORDER — PROCHLORPERAZINE MALEATE 10 MG PO TABS
ORAL_TABLET | ORAL | Status: AC
  Filled 2016-06-12: qty 1

## 2016-06-12 MED ORDER — DIPHENHYDRAMINE HCL 25 MG PO CAPS
ORAL_CAPSULE | ORAL | Status: AC
  Filled 2016-06-12: qty 2

## 2016-06-12 NOTE — Assessment & Plan Note (Signed)
This is chronic in nature. It is stable. Recommend observation only. We'll give him a reduced dose Zometa twice a year due to chronic renal failure He does not require dose adjustment for his chemotherapy.  

## 2016-06-12 NOTE — Patient Instructions (Signed)

## 2016-06-12 NOTE — Patient Instructions (Signed)
[facility] Discharge Instructions for Patients Receiving Chemotherapy  Today you received the following chemotherapy agents Darzalex and Velcade   To help prevent nausea and vomiting after your treatment, we encourage you to take your nausea medication as directed.    If you develop nausea and vomiting that is not controlled by your nausea medication, call the clinic.   BELOW ARE SYMPTOMS THAT SHOULD BE REPORTED IMMEDIATELY:  *FEVER GREATER THAN 100.5 F  *CHILLS WITH OR WITHOUT FEVER  NAUSEA AND VOMITING THAT IS NOT CONTROLLED WITH YOUR NAUSEA MEDICATION  *UNUSUAL SHORTNESS OF BREATH  *UNUSUAL BRUISING OR BLEEDING  TENDERNESS IN MOUTH AND THROAT WITH OR WITHOUT PRESENCE OF ULCERS  *URINARY PROBLEMS  *BOWEL PROBLEMS  UNUSUAL RASH Items with * indicate a potential emergency and should be followed up as soon as possible.  Feel free to call the clinic you have any questions or concerns. The clinic phone number is [phone].  Please show the CHEMO ALERT CARD at check-in to the Emergency Department and triage nurse.   

## 2016-06-12 NOTE — Assessment & Plan Note (Signed)
He tolerated chemotherapy well. He has pancytopenic but remained asymptomatic. Denies bone pain. No peripheral neuropathy. We will continue treatment without dose adjustment. Recent myeloma panel show significant partial response with treatment I will continue to see him treatment. He will continue acyclovir for antimicrobial prophylaxis and weekly pulsed dexamethasone on Mondays Due to recent GERD, I plan to reduce a dose of Solu-Medrol to 40 mg I plan to start dexamethasone taper after his next visit; he is instructed to reduce dexamethasone to 12 mg once a week He will take calcium, vitamin D and will receive Zometa every 6 months, next due around August 2018.

## 2016-06-12 NOTE — Assessment & Plan Note (Signed)
He is not symptomatic from anemia or thrombocytopenia. I will continue to treatment without dose adjustment

## 2016-06-12 NOTE — Progress Notes (Signed)
[place] OFFICE PROGRESS NOTE  Patient Care Team: Lucianne Lei, MD as PCP - General (Family Medicine) Heath Lark, MD as Consulting Physician (Hematology and Oncology) Pleasant, [name], RN as [place] Management  SUMMARY OF ONCOLOGIC HISTORY:   Multiple myeloma in relapse [facility])   12/12/2010 Initial Diagnosis    Multiple myeloma      02/16/2014 Imaging    Skeletal survey showed diffuse osteopenia      03/02/2014 Bone Marrow Biopsy    Accession: [id number] BM biopsy showed only 5 % plasma cells. However, the biopsy was difficult and the bone was fragmented during the procedure. Cytogenetics and FISH study is normal      03/03/2014 Procedure    he has port placement      03/10/2014 - 05/19/2014 Chemotherapy    he received elotuzumab and revlimid      05/20/2014 - 02/25/2016 Chemotherapy    Treatment is switched to maintenance Revlimid only. Treatment is stopped due to progressive disease      11/15/2014 Adverse Reaction    He has mild worsening anemia. Does of Revlimid reduced to 5 mg 21 days on, 7 days off      03/06/2016 -  Chemotherapy    He received Daratumumab and Velcade       INTERVAL HISTORY: Please see below for problem oriented charting. He is seen in the infusion room before treatment. Since the last time I saw him, he is doing well. Denies recent infection. The patient denies any recent signs or symptoms of bleeding such as spontaneous epistaxis, hematuria or hematochezia. He denies bone pain or neuropathy.  REVIEW OF SYSTEMS:   Constitutional: Denies fevers, chills or abnormal weight loss Eyes: Denies blurriness of vision Ears, nose, mouth, throat, and face: Denies mucositis or sore throat Respiratory: Denies cough, dyspnea or wheezes Cardiovascular: Denies palpitation, chest discomfort or lower extremity swelling Gastrointestinal:  Denies nausea, heartburn or change in bowel habits Skin: Denies abnormal skin  rashes Lymphatics: Denies new lymphadenopathy or easy bruising Neurological:Denies numbness, tingling or new weaknesses Behavioral/Psych: Mood is stable, no new changes  All other systems were reviewed with the patient and are negative.  I have reviewed the past medical history, past surgical history, social history and family history with the patient and they are unchanged from previous note.  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  MEDICATIONS:  Current Outpatient Prescriptions  Medication Sig Dispense Refill  . acyclovir (ZOVIRAX) 400 MG tablet Take 1 tablet (400 mg total) by mouth daily. 60 tablet 11  . allopurinol (ZYLOPRIM) 100 MG tablet Take 100 mg by mouth 2 (two) times daily.     Marland Kitchen amoxicillin-clavulanate (AUGMENTIN) 875-125 MG tablet Take 1 tablet by mouth every 12 (twelve) hours. 6 tablet 0  . aspirin EC 81 MG tablet Take 81 mg by mouth daily.    Marland Kitchen atorvastatin (LIPITOR) 20 MG tablet Take 20 mg by mouth at bedtime.     . brimonidine (ALPHAGAN) 0.15 % ophthalmic solution Place 1 drop into the right eye 3 (three) times daily.    . cholecalciferol (VITAMIN D) 1000 UNITS tablet Take 1,000 Units by mouth daily.    Marland Kitchen dexamethasone (DECADRON) 4 MG tablet Take 5 tablets every Mondays 20 tablet 4  . dorzolamide-timolol (COSOPT) 22.3-6.8 MG/ML ophthalmic solution Place 1 drop into both eyes 2 (two) times daily.     . fluorometholone (FML) 0.1 % ophthalmic suspension Place 1 drop into both eyes daily.    Marland Kitchen  latanoprost (XALATAN) 0.005 % ophthalmic solution Place 1 drop into the right eye at bedtime.    . lidocaine-prilocaine (EMLA) cream Apply 1 application topically as needed. 30 g 1  . lidocaine-prilocaine (EMLA) cream Apply to affected area once 30 g 3  . loperamide (IMODIUM A-D) 2 MG tablet 1 po after each watery BM.  Maximum 6 per 24hrs. 30 tablet PRN  . ondansetron (ZOFRAN) 8 MG tablet Take 1 tablet (8 mg total) by mouth 2 (two) times daily as needed (Nausea or  vomiting). 30 tablet 1  . prochlorperazine (COMPAZINE) 10 MG tablet Take 1 tablet (10 mg total) by mouth every 6 (six) hours as needed (Nausea or vomiting). 30 tablet 1   No current facility-administered medications for this visit.     PHYSICAL EXAMINATION: ECOG PERFORMANCE STATUS: 1 - Symptomatic but completely ambulatory  Vitals:   06/12/16 0849  BP: 131/70  Pulse: 61  Resp: 18  Temp: 98.2 F (36.8 C)   There were no vitals filed for this visit.  GENERAL:alert, no distress and comfortable SKIN: skin color, texture, turgor are normal, no rashes or significant lesions EYES: normal, Conjunctiva are pink and non-injected, sclera clear OROPHARYNX:no exudate, no erythema and lips, buccal mucosa, and tongue normal  NECK: supple, thyroid normal size, non-tender, without nodularity LYMPH:  no palpable lymphadenopathy in the cervical, axillary or inguinal LUNGS: clear to auscultation and percussion with normal breathing effort HEART: regular rate & rhythm and no murmurs and no lower extremity edema ABDOMEN:abdomen soft, non-tender and normal bowel sounds Musculoskeletal:no cyanosis of digits and no clubbing  NEURO: alert & oriented x 3 with fluent speech, no focal motor/sensory deficits  LABORATORY DATA:  I have reviewed the data as listed    Component Value Date/Time   NA 135 (L) 05/22/2016 0800   K 3.9 05/22/2016 0800   CL 108 04/10/2015 0405   CL 107 06/29/2012 1131   CO2 21 (L) 05/22/2016 0800   GLUCOSE 120 05/22/2016 0800   GLUCOSE 143 (H) 06/29/2012 1131   BUN 28.9 (H) 05/22/2016 0800   CREATININE 1.6 (H) 05/22/2016 0800   CALCIUM 8.3 (L) 05/22/2016 0800   PROT 6.3 (L) 05/22/2016 0800   ALBUMIN 3.1 (L) 05/22/2016 0800   AST 14 05/22/2016 0800   ALT 22 05/22/2016 0800   ALKPHOS 61 05/22/2016 0800   BILITOT 0.39 05/22/2016 0800   GFRNONAA 38 (L) 04/10/2015 0405   GFRAA 44 (L) 04/10/2015 0405    No results found for: SPEP, UPEP  Lab Results  Component Value Date    WBC 6.8 06/12/2016   NEUTROABS 3.7 06/12/2016   HGB 8.9 (L) 06/12/2016   HCT 26.9 (L) 06/12/2016   MCV 94.6 06/12/2016   PLT 116 (L) 06/12/2016      Chemistry      Component Value Date/Time   NA 135 (L) 05/22/2016 0800   K 3.9 05/22/2016 0800   CL 108 04/10/2015 0405   CL 107 06/29/2012 1131   CO2 21 (L) 05/22/2016 0800   BUN 28.9 (H) 05/22/2016 0800   CREATININE 1.6 (H) 05/22/2016 0800      Component Value Date/Time   CALCIUM 8.3 (L) 05/22/2016 0800   ALKPHOS 61 05/22/2016 0800   AST 14 05/22/2016 0800   ALT 22 05/22/2016 0800   BILITOT 0.39 05/22/2016 0800      ASSESSMENT & PLAN:  Multiple myeloma in relapse ([place]) He tolerated chemotherapy well. He has pancytopenic but remained asymptomatic. Denies bone pain. No peripheral neuropathy. We  will continue treatment without dose adjustment. Recent myeloma panel show significant partial response with treatment I will continue to see him treatment. He will continue acyclovir for antimicrobial prophylaxis and weekly pulsed dexamethasone on Mondays Due to recent GERD, I plan to reduce a dose of Solu-Medrol to 40 mg I plan to start dexamethasone taper after his next visit; he is instructed to reduce dexamethasone to 12 mg once a week He will take calcium, vitamin D and will receive Zometa every 6 months, next due around August 2018.  Pancytopenia, acquired ([place]) He is not symptomatic from anemia or thrombocytopenia. I will continue to treatment without dose adjustment  Chronic renal insufficiency, stage III (moderate) This is chronic in nature. It is stable. Recommend observation only. We'll give him a reduced dose Zometa twice a year due to chronic renal failure He does not require dose adjustment for his chemotherapy.    No orders of the defined types were placed in this encounter.  All questions were answered. The patient knows to call the clinic with any problems, questions or concerns. No barriers to learning was  detected. I spent 15 minutes counseling the patient face to face. The total time spent in the appointment was 20 minutes and more than 50% was on counseling and review of test results     Heath Lark, MD 06/12/2016 9:04 AM

## 2016-06-12 NOTE — Progress Notes (Signed)
Dr Alvy Bimler ok to tx today with today's labs.

## 2016-06-13 LAB — KAPPA/LAMBDA LIGHT CHAINS
Ig Kappa Free Light Chain: 95.1 mg/L — ABNORMAL HIGH (ref 3.3–19.4)
Ig Lambda Free Light Chain: 2.7 mg/L — ABNORMAL LOW (ref 5.7–26.3)
Kappa/Lambda FluidC Ratio: 35.22 — ABNORMAL HIGH (ref 0.26–1.65)

## 2016-06-17 DIAGNOSIS — H3581 Retinal edema: Secondary | ICD-10-CM | POA: Diagnosis not present

## 2016-06-17 DIAGNOSIS — H43813 Vitreous degeneration, bilateral: Secondary | ICD-10-CM | POA: Diagnosis not present

## 2016-06-17 DIAGNOSIS — H4031X2 Glaucoma secondary to eye trauma, right eye, moderate stage: Secondary | ICD-10-CM | POA: Diagnosis not present

## 2016-06-17 DIAGNOSIS — H26492 Other secondary cataract, left eye: Secondary | ICD-10-CM | POA: Diagnosis not present

## 2016-06-17 LAB — MULTIPLE MYELOMA PANEL, SERUM
ALBUMIN SERPL ELPH-MCNC: 3.2 g/dL (ref 2.9–4.4)
ALPHA 1: 0.3 g/dL (ref 0.0–0.4)
ALPHA2 GLOB SERPL ELPH-MCNC: 0.8 g/dL (ref 0.4–1.0)
Albumin/Glob SerPl: 1.1 (ref 0.7–1.7)
B-GLOBULIN SERPL ELPH-MCNC: 1 g/dL (ref 0.7–1.3)
Gamma Glob SerPl Elph-Mcnc: 1 g/dL (ref 0.4–1.8)
Globulin, Total: 3.1 g/dL (ref 2.2–3.9)
IgA, Qn, Serum: 5 mg/dL — ABNORMAL LOW (ref 61–437)
IgM, Qn, Serum: 6 mg/dL — ABNORMAL LOW (ref 15–143)
M PROTEIN SERPL ELPH-MCNC: 0.9 g/dL — AB
TOTAL PROTEIN: 6.3 g/dL (ref 6.0–8.5)

## 2016-07-03 ENCOUNTER — Other Ambulatory Visit: Payer: Self-pay | Admitting: *Deleted

## 2016-07-03 ENCOUNTER — Ambulatory Visit (HOSPITAL_BASED_OUTPATIENT_CLINIC_OR_DEPARTMENT_OTHER): Payer: Medicare Other

## 2016-07-03 ENCOUNTER — Other Ambulatory Visit (HOSPITAL_BASED_OUTPATIENT_CLINIC_OR_DEPARTMENT_OTHER): Payer: Medicare Other

## 2016-07-03 ENCOUNTER — Encounter: Payer: Self-pay | Admitting: Hematology and Oncology

## 2016-07-03 ENCOUNTER — Ambulatory Visit: Payer: Medicare Other

## 2016-07-03 ENCOUNTER — Ambulatory Visit (HOSPITAL_BASED_OUTPATIENT_CLINIC_OR_DEPARTMENT_OTHER): Payer: Medicare Other | Admitting: Hematology and Oncology

## 2016-07-03 VITALS — BP 113/63 | HR 58 | Temp 97.8°F | Resp 17

## 2016-07-03 DIAGNOSIS — C9002 Multiple myeloma in relapse: Secondary | ICD-10-CM

## 2016-07-03 DIAGNOSIS — C9001 Multiple myeloma in remission: Secondary | ICD-10-CM

## 2016-07-03 DIAGNOSIS — Z5112 Encounter for antineoplastic immunotherapy: Secondary | ICD-10-CM

## 2016-07-03 DIAGNOSIS — N183 Chronic kidney disease, stage 3 unspecified: Secondary | ICD-10-CM

## 2016-07-03 DIAGNOSIS — D631 Anemia in chronic kidney disease: Secondary | ICD-10-CM

## 2016-07-03 DIAGNOSIS — G620 Drug-induced polyneuropathy: Secondary | ICD-10-CM | POA: Diagnosis not present

## 2016-07-03 DIAGNOSIS — T451X5A Adverse effect of antineoplastic and immunosuppressive drugs, initial encounter: Secondary | ICD-10-CM

## 2016-07-03 DIAGNOSIS — Z95828 Presence of other vascular implants and grafts: Secondary | ICD-10-CM

## 2016-07-03 LAB — CBC WITH DIFFERENTIAL/PLATELET
BASO%: 0.1 % (ref 0.0–2.0)
Basophils Absolute: 0 10*3/uL (ref 0.0–0.1)
EOS ABS: 0 10*3/uL (ref 0.0–0.5)
EOS%: 0 % (ref 0.0–7.0)
HCT: 25.5 % — ABNORMAL LOW (ref 38.4–49.9)
HEMOGLOBIN: 8.7 g/dL — AB (ref 13.0–17.1)
LYMPH%: 14.4 % (ref 14.0–49.0)
MCH: 31.8 pg (ref 27.2–33.4)
MCHC: 33.9 g/dL (ref 32.0–36.0)
MCV: 93.9 fL (ref 79.3–98.0)
MONO#: 0.6 10*3/uL (ref 0.1–0.9)
MONO%: 5 % (ref 0.0–14.0)
NEUT%: 80.5 % — ABNORMAL HIGH (ref 39.0–75.0)
NEUTROS ABS: 9.1 10*3/uL — AB (ref 1.5–6.5)
Platelets: 145 10*3/uL (ref 140–400)
RBC: 2.72 10*6/uL — ABNORMAL LOW (ref 4.20–5.82)
RDW: 17.2 % — AB (ref 11.0–14.6)
WBC: 11.3 10*3/uL — AB (ref 4.0–10.3)
lymph#: 1.6 10*3/uL (ref 0.9–3.3)

## 2016-07-03 LAB — COMPREHENSIVE METABOLIC PANEL
ALBUMIN: 3.1 g/dL — AB (ref 3.5–5.0)
ALK PHOS: 60 U/L (ref 40–150)
ALT: 24 U/L (ref 0–55)
AST: 16 U/L (ref 5–34)
Anion Gap: 14 mEq/L — ABNORMAL HIGH (ref 3–11)
BILIRUBIN TOTAL: 0.4 mg/dL (ref 0.20–1.20)
BUN: 26.2 mg/dL — AB (ref 7.0–26.0)
CO2: 20 mEq/L — ABNORMAL LOW (ref 22–29)
Calcium: 9.4 mg/dL (ref 8.4–10.4)
Chloride: 103 mEq/L (ref 98–109)
Creatinine: 1.8 mg/dL — ABNORMAL HIGH (ref 0.7–1.3)
EGFR: 43 mL/min/{1.73_m2} — ABNORMAL LOW (ref >90)
GLUCOSE: 150 mg/dL — AB (ref 70–140)
Potassium: 4 mEq/L (ref 3.5–5.1)
SODIUM: 138 meq/L (ref 136–145)
TOTAL PROTEIN: 6.8 g/dL (ref 6.4–8.3)

## 2016-07-03 MED ORDER — BORTEZOMIB CHEMO SQ INJECTION 3.5 MG (2.5MG/ML)
1.3000 mg/m2 | Freq: Once | INTRAMUSCULAR | Status: AC
  Administered 2016-07-03: 2.5 mg via SUBCUTANEOUS
  Filled 2016-07-03: qty 2.5

## 2016-07-03 MED ORDER — SODIUM CHLORIDE 0.9 % IV SOLN
15.2000 mg/kg | Freq: Once | INTRAVENOUS | Status: AC
  Administered 2016-07-03: 1200 mg via INTRAVENOUS
  Filled 2016-07-03: qty 60

## 2016-07-03 MED ORDER — ACETAMINOPHEN 325 MG PO TABS
650.0000 mg | ORAL_TABLET | Freq: Once | ORAL | Status: AC
  Administered 2016-07-03: 650 mg via ORAL

## 2016-07-03 MED ORDER — ACETAMINOPHEN 325 MG PO TABS
ORAL_TABLET | ORAL | Status: AC
  Filled 2016-07-03: qty 2

## 2016-07-03 MED ORDER — SODIUM CHLORIDE 0.9% FLUSH
10.0000 mL | Freq: Once | INTRAVENOUS | Status: AC
  Administered 2016-07-03: 10 mL
  Filled 2016-07-03: qty 10

## 2016-07-03 MED ORDER — DIPHENHYDRAMINE HCL 25 MG PO CAPS
ORAL_CAPSULE | ORAL | Status: AC
  Filled 2016-07-03: qty 2

## 2016-07-03 MED ORDER — PROCHLORPERAZINE MALEATE 10 MG PO TABS
10.0000 mg | ORAL_TABLET | Freq: Once | ORAL | Status: AC
  Administered 2016-07-03: 10 mg via ORAL

## 2016-07-03 MED ORDER — HEPARIN SOD (PORK) LOCK FLUSH 100 UNIT/ML IV SOLN
500.0000 [IU] | Freq: Once | INTRAVENOUS | Status: AC | PRN
  Administered 2016-07-03: 500 [IU]
  Filled 2016-07-03: qty 5

## 2016-07-03 MED ORDER — SODIUM CHLORIDE 0.9% FLUSH
10.0000 mL | INTRAVENOUS | Status: DC | PRN
  Administered 2016-07-03: 10 mL
  Filled 2016-07-03: qty 10

## 2016-07-03 MED ORDER — DIPHENHYDRAMINE HCL 25 MG PO CAPS
50.0000 mg | ORAL_CAPSULE | Freq: Once | ORAL | Status: AC
  Administered 2016-07-03: 50 mg via ORAL

## 2016-07-03 MED ORDER — METHYLPREDNISOLONE SODIUM SUCC 40 MG IJ SOLR
40.0000 mg | Freq: Once | INTRAMUSCULAR | Status: AC
  Administered 2016-07-03: 40 mg via INTRAVENOUS

## 2016-07-03 MED ORDER — PROCHLORPERAZINE MALEATE 10 MG PO TABS: 10.0000 mg | ORAL_TABLET | Freq: Once | ORAL | Status: AC

## 2016-07-03 MED ORDER — PROCHLORPERAZINE MALEATE 10 MG PO TABS
ORAL_TABLET | ORAL | Status: AC
  Filled 2016-07-03: qty 1

## 2016-07-03 MED ORDER — METHYLPREDNISOLONE SODIUM SUCC 40 MG IJ SOLR
INTRAMUSCULAR | Status: AC
  Filled 2016-07-03: qty 1

## 2016-07-03 MED ORDER — SODIUM CHLORIDE 0.9 % IV SOLN
Freq: Once | INTRAVENOUS | Status: AC
  Administered 2016-07-03: 08:00:00 via INTRAVENOUS

## 2016-07-03 NOTE — Assessment & Plan Note (Signed)
This is chronic in nature. It is stable. Recommend observation only. We'll give him a reduced dose Zometa twice a year due to chronic renal failure He does not require dose adjustment for his chemotherapy.  

## 2016-07-03 NOTE — Assessment & Plan Note (Signed)
He tolerated chemotherapy well. He has pancytopenic but remained asymptomatic. Denies bone pain. No peripheral neuropathy. We will continue treatment without dose adjustment. Recent myeloma panel show significant partial response with treatment I will continue to see him treatment. He will continue acyclovir for antimicrobial prophylaxis and weekly pulsed dexamethasone on Mondays Due to recent GERD, I plan to reduce a dose of Solu-Medrol to 40 mg I will continue dexamethasone taper.  He is instructed to reduce dexamethasone to 8 mg once a week He will take calcium, vitamin D and will receive Zometa every 6 months, next due around August 2018.

## 2016-07-03 NOTE — Assessment & Plan Note (Signed)
He is noted to have progressive anemia, likely due to progressive bone marrow disease, on top of his chronic renal failure. He will be given additional blood transfusion if needed, if hemoglobin dropped to less than 8 g

## 2016-07-03 NOTE — Patient Instructions (Signed)
[facility] Discharge Instructions for Patients Receiving Chemotherapy  Today you received the following chemotherapy agents Darzalex and Velcade   To help prevent nausea and vomiting after your treatment, we encourage you to take your nausea medication as directed.    If you develop nausea and vomiting that is not controlled by your nausea medication, call the clinic.   BELOW ARE SYMPTOMS THAT SHOULD BE REPORTED IMMEDIATELY:  *FEVER GREATER THAN 100.5 F  *CHILLS WITH OR WITHOUT FEVER  NAUSEA AND VOMITING THAT IS NOT CONTROLLED WITH YOUR NAUSEA MEDICATION  *UNUSUAL SHORTNESS OF BREATH  *UNUSUAL BRUISING OR BLEEDING  TENDERNESS IN MOUTH AND THROAT WITH OR WITHOUT PRESENCE OF ULCERS  *URINARY PROBLEMS  *BOWEL PROBLEMS  UNUSUAL RASH Items with * indicate a potential emergency and should be followed up as soon as possible.  Feel free to call the clinic you have any questions or concerns. The clinic phone number is [phone].  Please show the CHEMO ALERT CARD at check-in to the Emergency Department and triage nurse.   

## 2016-07-03 NOTE — Patient Instructions (Signed)
Implanted Port Home Guide An implanted port is a type of central line that is placed under the skin. Central lines are used to provide IV access when treatment or nutrition needs to be given through a person's veins. Implanted ports are used for long-term IV access. An implanted port may be placed because:  You need IV medicine that would be irritating to the small veins in your hands or arms.  You need long-term IV medicines, such as antibiotics.  You need IV nutrition for a long period.  You need frequent blood draws for lab tests.  You need dialysis.  Implanted ports are usually placed in the chest area, but they can also be placed in the upper arm, the abdomen, or the leg. An implanted port has two main parts:  Reservoir. The reservoir is round and will appear as a small, raised area under your skin. The reservoir is the part where a needle is inserted to give medicines or draw blood.  Catheter. The catheter is a thin, flexible tube that extends from the reservoir. The catheter is placed into a large vein. Medicine that is inserted into the reservoir goes into the catheter and then into the vein.  How will I care for my incision site? Do not get the incision site wet. Bathe or shower as directed by your health care provider. How is my port accessed? Special steps must be taken to access the port:  Before the port is accessed, a numbing cream can be placed on the skin. This helps numb the skin over the port site.  Your health care provider uses a sterile technique to access the port. ? Your health care provider must put on a mask and sterile gloves. ? The skin over your port is cleaned carefully with an antiseptic and allowed to dry. ? The port is gently pinched between sterile gloves, and a needle is inserted into the port.  Only "non-coring" port needles should be used to access the port. Once the port is accessed, a blood return should be checked. This helps ensure that the port  is in the vein and is not clogged.  If your port needs to remain accessed for a constant infusion, a clear (transparent) bandage will be placed over the needle site. The bandage and needle will need to be changed every week, or as directed by your health care provider.  Keep the bandage covering the needle clean and dry. Do not get it wet. Follow your health care provider's instructions on how to take a shower or bath while the port is accessed.  If your port does not need to stay accessed, no bandage is needed over the port.  What is flushing? Flushing helps keep the port from getting clogged. Follow your health care provider's instructions on how and when to flush the port. Ports are usually flushed with saline solution or a medicine called heparin. The need for flushing will depend on how the port is used.  If the port is used for intermittent medicines or blood draws, the port will need to be flushed: ? After medicines have been given. ? After blood has been drawn. ? As part of routine maintenance.  If a constant infusion is running, the port may not need to be flushed.  How long will my port stay implanted? The port can stay in for as long as your health care provider thinks it is needed. When it is time for the port to come out, surgery will be   done to remove it. The procedure is similar to the one performed when the port was put in. When should I seek immediate medical care? When you have an implanted port, you should seek immediate medical care if:  You notice a bad smell coming from the incision site.  You have swelling, redness, or drainage at the incision site.  You have more swelling or pain at the port site or the surrounding area.  You have a fever that is not controlled with medicine.  This information is not intended to replace advice given to you by your health care provider. Make sure you discuss any questions you have with your health care provider. Document  Released: 12/24/2004 Document Revised: 06/01/2015 Document Reviewed: 08/31/2012 [employer] Interactive Patient Education  2017 [employer]  

## 2016-07-03 NOTE — Progress Notes (Signed)
[place] OFFICE PROGRESS NOTE  Patient Care Team: Lucianne Lei, MD as PCP - General (Family Medicine) Heath Lark, MD as Consulting Physician (Hematology and Oncology) Pleasant, [name], RN as [place] Management  SUMMARY OF ONCOLOGIC HISTORY:   Multiple myeloma in relapse [facility])   12/12/2010 Initial Diagnosis    Multiple myeloma      02/16/2014 Imaging    Skeletal survey showed diffuse osteopenia      03/02/2014 Bone Marrow Biopsy    Accession: [id number] biopsy showed only 5 % plasma cells. However, the biopsy was difficult and the bone was fragmented during the procedure. Cytogenetics and FISH study is normal      03/03/2014 Procedure    he has port placement      03/10/2014 - 05/19/2014 Chemotherapy    he received elotuzumab and revlimid      05/20/2014 - 02/25/2016 Chemotherapy    Treatment is switched to maintenance Revlimid only. Treatment is stopped due to progressive disease      11/15/2014 Adverse Reaction    He has mild worsening anemia. Does of Revlimid reduced to 5 mg 21 days on, 7 days off      03/06/2016 -  Chemotherapy    He received Daratumumab and Velcade       INTERVAL HISTORY: Please see below for problem oriented charting. He is seen in the treatment room He feels well Denies progression of peripheral neuropathy Denies bone pain No recent infection The patient denies any recent signs or symptoms of bleeding such as spontaneous epistaxis, hematuria or hematochezia.   REVIEW OF SYSTEMS:   Constitutional: Denies fevers, chills or abnormal weight loss Eyes: Denies blurriness of vision Ears, nose, mouth, throat, and face: Denies mucositis or sore throat Respiratory: Denies cough, dyspnea or wheezes Cardiovascular: Denies palpitation, chest discomfort or lower extremity swelling Gastrointestinal:  Denies nausea, heartburn or change in bowel habits Skin: Denies abnormal skin rashes Lymphatics: Denies new  lymphadenopathy or easy bruising Neurological:Denies numbness, tingling or new weaknesses Behavioral/Psych: Mood is stable, no new changes  All other systems were reviewed with the patient and are negative.  I have reviewed the past medical history, past surgical history, social history and family history with the patient and they are unchanged from previous note.  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  MEDICATIONS:  Current Outpatient Prescriptions  Medication Sig Dispense Refill  . acyclovir (ZOVIRAX) 400 MG tablet Take 1 tablet (400 mg total) by mouth daily. 60 tablet 11  . allopurinol (ZYLOPRIM) 100 MG tablet Take 100 mg by mouth 2 (two) times daily.     Marland Kitchen amoxicillin-clavulanate (AUGMENTIN) 875-125 MG tablet Take 1 tablet by mouth every 12 (twelve) hours. 6 tablet 0  . aspirin EC 81 MG tablet Take 81 mg by mouth daily.    Marland Kitchen atorvastatin (LIPITOR) 20 MG tablet Take 20 mg by mouth at bedtime.     . brimonidine (ALPHAGAN) 0.15 % ophthalmic solution Place 1 drop into the right eye 3 (three) times daily.    . cholecalciferol (VITAMIN D) 1000 UNITS tablet Take 1,000 Units by mouth daily.    Marland Kitchen dexamethasone (DECADRON) 4 MG tablet Take 5 tablets every Mondays 20 tablet 4  . dorzolamide-timolol (COSOPT) 22.3-6.8 MG/ML ophthalmic solution Place 1 drop into both eyes 2 (two) times daily.     . fluorometholone (FML) 0.1 % ophthalmic suspension Place 1 drop into both eyes daily.    Marland Kitchen latanoprost (XALATAN) 0.005 % ophthalmic solution  Place 1 drop into the right eye at bedtime.    . lidocaine-prilocaine (EMLA) cream Apply 1 application topically as needed. 30 g 1  . lidocaine-prilocaine (EMLA) cream Apply to affected area once 30 g 3  . loperamide (IMODIUM A-D) 2 MG tablet 1 po after each watery BM.  Maximum 6 per 24hrs. 30 tablet PRN  . ondansetron (ZOFRAN) 8 MG tablet Take 1 tablet (8 mg total) by mouth 2 (two) times daily as needed (Nausea or vomiting). 30 tablet 1  .  prochlorperazine (COMPAZINE) 10 MG tablet Take 1 tablet (10 mg total) by mouth every 6 (six) hours as needed (Nausea or vomiting). 30 tablet 1   No current facility-administered medications for this visit.    Facility-Administered Medications Ordered in Other Visits  Medication Dose Route Frequency Provider Last Rate Last Dose  . bortezomib SQ (VELCADE) chemo injection 2.5 mg  1.3 mg/m2 (Treatment Plan Recorded) Subcutaneous Once Alvy Bimler, Marlis Oldaker, MD      . daratumumab (DARZALEX) 1,200 mg in sodium chloride 0.9 % 440 mL (2.4 mg/mL) chemo infusion  15.2 mg/kg (Treatment Plan Recorded) Intravenous Once Alvy Bimler, Tache Bobst, MD      . heparin lock flush 100 unit/mL  500 Units Intracatheter Once PRN Alvy Bimler, Shatonia Hoots, MD      . prochlorperazine (COMPAZINE) tablet 10 mg  10 mg Oral Once Alvy Bimler, Eastin Swing, MD      . sodium chloride flush (NS) 0.9 % injection 10 mL  10 mL Intracatheter PRN Alvy Bimler, Jaclyne Haverstick, MD        PHYSICAL EXAMINATION: ECOG PERFORMANCE STATUS: 1 - Symptomatic but completely ambulatory GENERAL:alert, no distress and comfortable SKIN: skin color, texture, turgor are normal, no rashes or significant lesions EYES: normal, Conjunctiva are pink and non-injected, sclera clear OROPHARYNX:no exudate, no erythema and lips, buccal mucosa, and tongue normal  NECK: supple, thyroid normal size, non-tender, without nodularity LYMPH:  no palpable lymphadenopathy in the cervical, axillary or inguinal LUNGS: clear to auscultation and percussion with normal breathing effort HEART: regular rate & rhythm and no murmurs and no lower extremity edema ABDOMEN:abdomen soft, non-tender and normal bowel sounds Musculoskeletal:no cyanosis of digits and no clubbing  NEURO: alert & oriented x 3 with fluent speech, no focal motor/sensory deficits  LABORATORY DATA:  I have reviewed the data as listed    Component Value Date/Time   NA 138 07/03/2016 0758   K 4.0 07/03/2016 0758   CL 108 04/10/2015 0405   CL 107 06/29/2012 1131   CO2  20 (L) 07/03/2016 0758   GLUCOSE 150 (H) 07/03/2016 0758   GLUCOSE 143 (H) 06/29/2012 1131   BUN 26.2 (H) 07/03/2016 0758   CREATININE 1.8 (H) 07/03/2016 0758   CALCIUM 9.4 07/03/2016 0758   PROT 6.8 07/03/2016 0758   ALBUMIN 3.1 (L) 07/03/2016 0758   AST 16 07/03/2016 0758   ALT 24 07/03/2016 0758   ALKPHOS 60 07/03/2016 0758   BILITOT 0.40 07/03/2016 0758   GFRNONAA 38 (L) 04/10/2015 0405   GFRAA 44 (L) 04/10/2015 0405    No results found for: SPEP, UPEP  Lab Results  Component Value Date   WBC 11.3 (H) 07/03/2016   NEUTROABS 9.1 (H) 07/03/2016   HGB 8.7 (L) 07/03/2016   HCT 25.5 (L) 07/03/2016   MCV 93.9 07/03/2016   PLT 145 07/03/2016      Chemistry      Component Value Date/Time   NA 138 07/03/2016 0758   K 4.0 07/03/2016 0758   CL 108 04/10/2015 0405  CL 107 06/29/2012 1131   CO2 20 (L) 07/03/2016 0758   BUN 26.2 (H) 07/03/2016 0758   CREATININE 1.8 (H) 07/03/2016 0758      Component Value Date/Time   CALCIUM 9.4 07/03/2016 0758   ALKPHOS 60 07/03/2016 0758   AST 16 07/03/2016 0758   ALT 24 07/03/2016 0758   BILITOT 0.40 07/03/2016 0758      ASSESSMENT & PLAN:  Multiple myeloma in relapse ([place]) He tolerated chemotherapy well. He has pancytopenic but remained asymptomatic. Denies bone pain. No peripheral neuropathy. We will continue treatment without dose adjustment. Recent myeloma panel show significant partial response with treatment I will continue to see him treatment. He will continue acyclovir for antimicrobial prophylaxis and weekly pulsed dexamethasone on Mondays Due to recent GERD, I plan to reduce a dose of Solu-Medrol to 40 mg I will continue dexamethasone taper.  He is instructed to reduce dexamethasone to 8 mg once a week He will take calcium, vitamin D and will receive Zometa every 6 months, next due around August 2018.  Chronic renal insufficiency, stage III (moderate) This is chronic in nature. It is stable. Recommend observation  only. We'll give him a reduced dose Zometa twice a year due to chronic renal failure He does not require dose adjustment for his chemotherapy.   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  I will continue to assess while he is receiving Velcade.  Anemia in chronic renal disease He is noted to have progressive anemia, likely due to progressive bone marrow disease, on top of his chronic renal failure. He will be given additional blood transfusion if needed, if hemoglobin dropped to less than 8 g   No orders of the defined types were placed in this encounter.  All questions were answered. The patient knows to call the clinic with any problems, questions or concerns. No barriers to learning was detected. I spent 15 minutes counseling the patient face to face. The total time spent in the appointment was 20 minutes and more than 50% was on counseling and review of test results     Heath Lark, MD 07/03/2016 9:23 AM

## 2016-07-03 NOTE — Assessment & Plan Note (Signed)
He has chronic mild peripheral neuropathy but it does not bother him.  I will continue to assess while he is receiving Velcade.

## 2016-07-04 LAB — KAPPA/LAMBDA LIGHT CHAINS
IG KAPPA FREE LIGHT CHAIN: 81.7 mg/L — AB (ref 3.3–19.4)
IG LAMBDA FREE LIGHT CHAIN: 2.6 mg/L — AB (ref 5.7–26.3)
Kappa/Lambda FluidC Ratio: 31.42 — ABNORMAL HIGH (ref 0.26–1.65)

## 2016-07-05 LAB — MULTIPLE MYELOMA PANEL, SERUM
ALBUMIN SERPL ELPH-MCNC: 3.2 g/dL (ref 2.9–4.4)
ALPHA 1: 0.3 g/dL (ref 0.0–0.4)
Albumin/Glob SerPl: 1.1 (ref 0.7–1.7)
Alpha2 Glob SerPl Elph-Mcnc: 0.8 g/dL (ref 0.4–1.0)
B-Globulin SerPl Elph-Mcnc: 1 g/dL (ref 0.7–1.3)
Gamma Glob SerPl Elph-Mcnc: 1.2 g/dL (ref 0.4–1.8)
Globulin, Total: 3.2 g/dL (ref 2.2–3.9)
IGA/IMMUNOGLOBULIN A, SERUM: 8 mg/dL — AB (ref 61–437)
IgG, Qn, Serum: 1288 mg/dL (ref 700–1600)
IgM, Qn, Serum: <5 mg/dL — ABNORMAL LOW (ref 15–143)
M Protein SerPl Elph-Mcnc: 1 g/dL — ABNORMAL HIGH
TOTAL PROTEIN: 6.4 g/dL (ref 6.0–8.5)

## 2016-07-24 ENCOUNTER — Ambulatory Visit (HOSPITAL_BASED_OUTPATIENT_CLINIC_OR_DEPARTMENT_OTHER): Payer: Medicare Other | Admitting: Hematology and Oncology

## 2016-07-24 ENCOUNTER — Other Ambulatory Visit (HOSPITAL_BASED_OUTPATIENT_CLINIC_OR_DEPARTMENT_OTHER): Payer: Medicare Other

## 2016-07-24 ENCOUNTER — Ambulatory Visit (HOSPITAL_BASED_OUTPATIENT_CLINIC_OR_DEPARTMENT_OTHER): Payer: Medicare Other

## 2016-07-24 ENCOUNTER — Encounter: Payer: Self-pay | Admitting: Hematology and Oncology

## 2016-07-24 ENCOUNTER — Telehealth: Payer: Self-pay | Admitting: Hematology and Oncology

## 2016-07-24 VITALS — BP 109/60 | HR 54 | Temp 97.9°F | Resp 17 | Ht 71.0 in | Wt 156.8 lb

## 2016-07-24 DIAGNOSIS — N183 Chronic kidney disease, stage 3 unspecified: Secondary | ICD-10-CM

## 2016-07-24 DIAGNOSIS — D631 Anemia in chronic kidney disease: Secondary | ICD-10-CM | POA: Diagnosis not present

## 2016-07-24 DIAGNOSIS — C9002 Multiple myeloma in relapse: Secondary | ICD-10-CM

## 2016-07-24 DIAGNOSIS — M8580 Other specified disorders of bone density and structure, unspecified site: Secondary | ICD-10-CM | POA: Diagnosis not present

## 2016-07-24 DIAGNOSIS — C9001 Multiple myeloma in remission: Secondary | ICD-10-CM

## 2016-07-24 DIAGNOSIS — Z5112 Encounter for antineoplastic immunotherapy: Secondary | ICD-10-CM | POA: Diagnosis not present

## 2016-07-24 LAB — COMPREHENSIVE METABOLIC PANEL
ALT: 33 U/L (ref 0–55)
ANION GAP: 9 meq/L (ref 3–11)
AST: 22 U/L (ref 5–34)
Albumin: 3 g/dL — ABNORMAL LOW (ref 3.5–5.0)
Alkaline Phosphatase: 57 U/L (ref 40–150)
BUN: 22.5 mg/dL (ref 7.0–26.0)
CALCIUM: 8.7 mg/dL (ref 8.4–10.4)
CHLORIDE: 109 meq/L (ref 98–109)
CO2: 20 mEq/L — ABNORMAL LOW (ref 22–29)
Creatinine: 1.5 mg/dL — ABNORMAL HIGH (ref 0.7–1.3)
EGFR: 51 mL/min/{1.73_m2} — ABNORMAL LOW (ref >90)
Glucose: 91 mg/dl (ref 70–140)
POTASSIUM: 4 meq/L (ref 3.5–5.1)
Sodium: 138 mEq/L (ref 136–145)
Total Bilirubin: 0.35 mg/dL (ref 0.20–1.20)
Total Protein: 6.6 g/dL (ref 6.4–8.3)

## 2016-07-24 LAB — CBC WITH DIFFERENTIAL/PLATELET
BASO%: 0.1 % (ref 0.0–2.0)
BASOS ABS: 0 10*3/uL (ref 0.0–0.1)
EOS%: 0 % (ref 0.0–7.0)
Eosinophils Absolute: 0 10*3/uL (ref 0.0–0.5)
HEMATOCRIT: 25.2 % — AB (ref 38.4–49.9)
HGB: 8.5 g/dL — ABNORMAL LOW (ref 13.0–17.1)
LYMPH#: 1.9 10*3/uL (ref 0.9–3.3)
LYMPH%: 19.4 % (ref 14.0–49.0)
MCH: 31.6 pg (ref 27.2–33.4)
MCHC: 33.7 g/dL (ref 32.0–36.0)
MCV: 93.7 fL (ref 79.3–98.0)
MONO#: 0.7 10*3/uL (ref 0.1–0.9)
MONO%: 7.3 % (ref 0.0–14.0)
NEUT#: 7.3 10*3/uL — ABNORMAL HIGH (ref 1.5–6.5)
NEUT%: 73.2 % (ref 39.0–75.0)
PLATELETS: 141 10*3/uL (ref 140–400)
RBC: 2.69 10*6/uL — ABNORMAL LOW (ref 4.20–5.82)
RDW: 17.2 % — ABNORMAL HIGH (ref 11.0–14.6)
WBC: 9.9 10*3/uL (ref 4.0–10.3)

## 2016-07-24 MED ORDER — SODIUM CHLORIDE 0.9% FLUSH
10.0000 mL | INTRAVENOUS | Status: DC | PRN
  Administered 2016-07-24: 10 mL
  Filled 2016-07-24: qty 10

## 2016-07-24 MED ORDER — BORTEZOMIB CHEMO SQ INJECTION 3.5 MG (2.5MG/ML)
1.3000 mg/m2 | Freq: Once | INTRAMUSCULAR | Status: AC
  Administered 2016-07-24: 2.5 mg via SUBCUTANEOUS
  Filled 2016-07-24: qty 2.5

## 2016-07-24 MED ORDER — PROCHLORPERAZINE MALEATE 10 MG PO TABS
ORAL_TABLET | ORAL | Status: AC
  Filled 2016-07-24: qty 1

## 2016-07-24 MED ORDER — SODIUM CHLORIDE 0.9 % IV SOLN
15.2000 mg/kg | Freq: Once | INTRAVENOUS | Status: AC
  Administered 2016-07-24: 1200 mg via INTRAVENOUS
  Filled 2016-07-24: qty 60

## 2016-07-24 MED ORDER — DIPHENHYDRAMINE HCL 25 MG PO CAPS
50.0000 mg | ORAL_CAPSULE | Freq: Once | ORAL | Status: AC
Start: 2016-07-24 — End: 2016-07-24
  Administered 2016-07-24: 50 mg via ORAL

## 2016-07-24 MED ORDER — PROCHLORPERAZINE MALEATE 10 MG PO TABS
10.0000 mg | ORAL_TABLET | Freq: Once | ORAL | Status: AC
  Administered 2016-07-24: 10 mg via ORAL

## 2016-07-24 MED ORDER — ACETAMINOPHEN 325 MG PO TABS
ORAL_TABLET | ORAL | Status: AC
  Filled 2016-07-24: qty 2

## 2016-07-24 MED ORDER — METHYLPREDNISOLONE SODIUM SUCC 40 MG IJ SOLR
INTRAMUSCULAR | Status: AC
  Filled 2016-07-24: qty 1

## 2016-07-24 MED ORDER — DIPHENHYDRAMINE HCL 25 MG PO CAPS
ORAL_CAPSULE | ORAL | Status: AC
  Filled 2016-07-24: qty 2

## 2016-07-24 MED ORDER — ACETAMINOPHEN 325 MG PO TABS
650.0000 mg | ORAL_TABLET | Freq: Once | ORAL | Status: AC
  Administered 2016-07-24: 650 mg via ORAL

## 2016-07-24 MED ORDER — PROCHLORPERAZINE MALEATE 10 MG PO TABS: 10.0000 mg | ORAL_TABLET | Freq: Once | ORAL | Status: DC

## 2016-07-24 MED ORDER — SODIUM CHLORIDE 0.9 % IV SOLN
Freq: Once | INTRAVENOUS | Status: AC
  Administered 2016-07-24: 09:00:00 via INTRAVENOUS

## 2016-07-24 MED ORDER — METHYLPREDNISOLONE SODIUM SUCC 40 MG IJ SOLR
40.0000 mg | Freq: Once | INTRAMUSCULAR | Status: AC
  Administered 2016-07-24: 40 mg via INTRAVENOUS

## 2016-07-24 MED ORDER — HEPARIN SOD (PORK) LOCK FLUSH 100 UNIT/ML IV SOLN
500.0000 [IU] | Freq: Once | INTRAVENOUS | Status: AC | PRN
  Administered 2016-07-24: 500 [IU]
  Filled 2016-07-24: qty 5

## 2016-07-24 NOTE — Telephone Encounter (Signed)
Scheduled appt per 7/18 sch message - Rn in treatment area to print patient new schedule. - per Maurice Small

## 2016-07-24 NOTE — Progress Notes (Signed)
[place] OFFICE PROGRESS NOTE  Patient Care Team: Lucianne Lei, MD as PCP - General (Family Medicine) Heath Lark, MD as Consulting Physician (Hematology and Oncology) Pleasant, [name], RN as [place] Management  SUMMARY OF ONCOLOGIC HISTORY:   Multiple myeloma in relapse [facility])   12/12/2010 Initial Diagnosis    Multiple myeloma      02/16/2014 Imaging    Skeletal survey showed diffuse osteopenia      03/02/2014 Bone Marrow Biopsy    Accession: [id number] BM biopsy showed only 5 % plasma cells. However, the biopsy was difficult and the bone was fragmented during the procedure. Cytogenetics and FISH study is normal      03/03/2014 Procedure    he has port placement      03/10/2014 - 05/19/2014 Chemotherapy    he received elotuzumab and revlimid      05/20/2014 - 02/25/2016 Chemotherapy    Treatment is switched to maintenance Revlimid only. Treatment is stopped due to progressive disease      11/15/2014 Adverse Reaction    He has mild worsening anemia. Does of Revlimid reduced to 5 mg 21 days on, 7 days off      03/06/2016 -  Chemotherapy    He received Daratumumab and Velcade       INTERVAL HISTORY: Please see below for problem oriented charting. He is seen in the infusion room. The patient denies recent infection.  No new bone pain. The patient denies any recent signs or symptoms of bleeding such as spontaneous epistaxis, hematuria or hematochezia.  REVIEW OF SYSTEMS:   Constitutional: Denies fevers, chills or abnormal weight loss Eyes: Denies blurriness of vision Ears, nose, mouth, throat, and face: Denies mucositis or sore throat Respiratory: Denies cough, dyspnea or wheezes Cardiovascular: Denies palpitation, chest discomfort or lower extremity swelling Gastrointestinal:  Denies nausea, heartburn or change in bowel habits Skin: Denies abnormal skin rashes Lymphatics: Denies new lymphadenopathy or easy  bruising Neurological:Denies numbness, tingling or new weaknesses Behavioral/Psych: Mood is stable, no new changes  All other systems were reviewed with the patient and are negative.  I have reviewed the past medical history, past surgical history, social history and family history with the patient and they are unchanged from previous note.  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  MEDICATIONS:  Current Outpatient Prescriptions  Medication Sig Dispense Refill  . acyclovir (ZOVIRAX) 400 MG tablet Take 1 tablet (400 mg total) by mouth daily. 60 tablet 11  . allopurinol (ZYLOPRIM) 100 MG tablet Take 100 mg by mouth 2 (two) times daily.     Marland Kitchen amoxicillin-clavulanate (AUGMENTIN) 875-125 MG tablet Take 1 tablet by mouth every 12 (twelve) hours. 6 tablet 0  . aspirin EC 81 MG tablet Take 81 mg by mouth daily.    Marland Kitchen atorvastatin (LIPITOR) 20 MG tablet Take 20 mg by mouth at bedtime.     . brimonidine (ALPHAGAN) 0.15 % ophthalmic solution Place 1 drop into the right eye 3 (three) times daily.    . cholecalciferol (VITAMIN D) 1000 UNITS tablet Take 1,000 Units by mouth daily.    Marland Kitchen dexamethasone (DECADRON) 4 MG tablet Take 5 tablets every Mondays 20 tablet 4  . dorzolamide-timolol (COSOPT) 22.3-6.8 MG/ML ophthalmic solution Place 1 drop into both eyes 2 (two) times daily.     . fluorometholone (FML) 0.1 % ophthalmic suspension Place 1 drop into both eyes daily.    Marland Kitchen latanoprost (XALATAN) 0.005 % ophthalmic solution Place 1 drop into the  right eye at bedtime.    . lidocaine-prilocaine (EMLA) cream Apply 1 application topically as needed. 30 g 1  . lidocaine-prilocaine (EMLA) cream Apply to affected area once 30 g 3  . loperamide (IMODIUM A-D) 2 MG tablet 1 po after each watery BM.  Maximum 6 per 24hrs. 30 tablet PRN  . ondansetron (ZOFRAN) 8 MG tablet Take 1 tablet (8 mg total) by mouth 2 (two) times daily as needed (Nausea or vomiting). 30 tablet 1  . prochlorperazine (COMPAZINE) 10 MG  tablet Take 1 tablet (10 mg total) by mouth every 6 (six) hours as needed (Nausea or vomiting). 30 tablet 1   No current facility-administered medications for this visit.     PHYSICAL EXAMINATION: ECOG PERFORMANCE STATUS: 1 - Symptomatic but completely ambulatory GENERAL:alert, no distress and comfortable SKIN: skin color, texture, turgor are normal, no rashes or significant lesions EYES: normal, Conjunctiva are pink and non-injected, sclera clear OROPHARYNX:no exudate, no erythema and lips, buccal mucosa, and tongue normal  NECK: supple, thyroid normal size, non-tender, without nodularity LYMPH:  no palpable lymphadenopathy in the cervical, axillary or inguinal LUNGS: clear to auscultation and percussion with normal breathing effort HEART: regular rate & rhythm and no murmurs and no lower extremity edema ABDOMEN:abdomen soft, non-tender and normal bowel sounds Musculoskeletal:no cyanosis of digits and no clubbing  NEURO: alert & oriented x 3 with fluent speech, no focal motor/sensory deficits  LABORATORY DATA:  I have reviewed the data as listed    Component Value Date/Time   NA 138 07/24/2016 0806   K 4.0 07/24/2016 0806   CL 108 04/10/2015 0405   CL 107 06/29/2012 1131   CO2 20 (L) 07/24/2016 0806   GLUCOSE 91 07/24/2016 0806   GLUCOSE 143 (H) 06/29/2012 1131   BUN 22.5 07/24/2016 0806   CREATININE 1.5 (H) 07/24/2016 0806   CALCIUM 8.7 07/24/2016 0806   PROT 6.6 07/24/2016 0806   ALBUMIN 3.0 (L) 07/24/2016 0806   AST 22 07/24/2016 0806   ALT 33 07/24/2016 0806   ALKPHOS 57 07/24/2016 0806   BILITOT 0.35 07/24/2016 0806   GFRNONAA 38 (L) 04/10/2015 0405   GFRAA 44 (L) 04/10/2015 0405    No results found for: SPEP, UPEP  Lab Results  Component Value Date   WBC 9.9 07/24/2016   NEUTROABS 7.3 (H) 07/24/2016   HGB 8.5 (L) 07/24/2016   HCT 25.2 (L) 07/24/2016   MCV 93.7 07/24/2016   PLT 141 07/24/2016      Chemistry      Component Value Date/Time   NA 138  07/24/2016 0806   K 4.0 07/24/2016 0806   CL 108 04/10/2015 0405   CL 107 06/29/2012 1131   CO2 20 (L) 07/24/2016 0806   BUN 22.5 07/24/2016 0806   CREATININE 1.5 (H) 07/24/2016 0806      Component Value Date/Time   CALCIUM 8.7 07/24/2016 0806   ALKPHOS 57 07/24/2016 0806   AST 22 07/24/2016 0806   ALT 33 07/24/2016 0806   BILITOT 0.35 07/24/2016 0806       ASSESSMENT & PLAN:  Multiple myeloma in relapse ([facility]) I am concerned about the trend of his myeloma panel recently He started to have worsening disease control since we space out his treatment Repeat myeloma panel today is pending If results came back worsening disease control, I would consider either adding Pomalyst or switching over to Kyprolis based regimen We will call the patient with test results next week  Anemia in chronic renal disease  He is noted to have progressive anemia, likely due to progressive bone marrow disease, on top of his chronic renal failure. He will be given additional blood transfusion if needed, if hemoglobin dropped to less than 8 g  Chronic renal insufficiency, stage III (moderate) This is chronic in nature. It is stable. Recommend observation only. We'll give him a reduced dose Zometa twice a year due to chronic renal failure He does not require dose adjustment for his chemotherapy.    No orders of the defined types were placed in this encounter.  All questions were answered. The patient knows to call the clinic with any problems, questions or concerns. No barriers to learning was detected. I spent 15 minutes counseling the patient face to face. The total time spent in the appointment was 20 minutes and more than 50% was on counseling and review of test results     Heath Lark, MD 07/24/2016 6:02 PM

## 2016-07-24 NOTE — Assessment & Plan Note (Signed)
I am concerned about the trend of his myeloma panel recently He started to have worsening disease control since we space out his treatment Repeat myeloma panel today is pending If results came back worsening disease control, I would consider either adding Pomalyst or switching over to Kyprolis based regimen We will call the patient with test results next week

## 2016-07-24 NOTE — Assessment & Plan Note (Signed)
He is noted to have progressive anemia, likely due to progressive bone marrow disease, on top of his chronic renal failure. He will be given additional blood transfusion if needed, if hemoglobin dropped to less than 8 g

## 2016-07-24 NOTE — Patient Instructions (Signed)
[facility] Discharge Instructions for Patients Receiving Chemotherapy  Today you received the following chemotherapy agents Darzalex and Velcade   To help prevent nausea and vomiting after your treatment, we encourage you to take your nausea medication as directed.    If you develop nausea and vomiting that is not controlled by your nausea medication, call the clinic.   BELOW ARE SYMPTOMS THAT SHOULD BE REPORTED IMMEDIATELY:  *FEVER GREATER THAN 100.5 F  *CHILLS WITH OR WITHOUT FEVER  NAUSEA AND VOMITING THAT IS NOT CONTROLLED WITH YOUR NAUSEA MEDICATION  *UNUSUAL SHORTNESS OF BREATH  *UNUSUAL BRUISING OR BLEEDING  TENDERNESS IN MOUTH AND THROAT WITH OR WITHOUT PRESENCE OF ULCERS  *URINARY PROBLEMS  *BOWEL PROBLEMS  UNUSUAL RASH Items with * indicate a potential emergency and should be followed up as soon as possible.  Feel free to call the clinic you have any questions or concerns. The clinic phone number is [phone].  Please show the CHEMO ALERT CARD at check-in to the Emergency Department and triage nurse.   

## 2016-07-24 NOTE — Assessment & Plan Note (Signed)
This is chronic in nature. It is stable. Recommend observation only. We'll give him a reduced dose Zometa twice a year due to chronic renal failure He does not require dose adjustment for his chemotherapy.  

## 2016-07-25 LAB — KAPPA/LAMBDA LIGHT CHAINS
IG LAMBDA FREE LIGHT CHAIN: 2 mg/L — AB (ref 5.7–26.3)
Ig Kappa Free Light Chain: 82.3 mg/L — ABNORMAL HIGH (ref 3.3–19.4)
Kappa/Lambda FluidC Ratio: 41.15 — ABNORMAL HIGH (ref 0.26–1.65)

## 2016-07-29 ENCOUNTER — Telehealth: Payer: Self-pay | Admitting: *Deleted

## 2016-07-29 LAB — MULTIPLE MYELOMA PANEL, SERUM
Albumin SerPl Elph-Mcnc: 2.9 g/dL (ref 2.9–4.4)
Albumin/Glob SerPl: 1 (ref 0.7–1.7)
Alpha 1: 0.3 g/dL (ref 0.0–0.4)
Alpha2 Glob SerPl Elph-Mcnc: 0.7 g/dL (ref 0.4–1.0)
B-GLOBULIN SERPL ELPH-MCNC: 0.9 g/dL (ref 0.7–1.3)
GAMMA GLOB SERPL ELPH-MCNC: 1.2 g/dL (ref 0.4–1.8)
GLOBULIN, TOTAL: 3.2 g/dL (ref 2.2–3.9)
IgA, Qn, Serum: 7 mg/dL — ABNORMAL LOW (ref 61–437)
IgG, Qn, Serum: 1356 mg/dL (ref 700–1600)
IgM, Qn, Serum: <5 mg/dL — ABNORMAL LOW (ref 15–143)
M PROTEIN SERPL ELPH-MCNC: 1 g/dL — AB
TOTAL PROTEIN: 6.1 g/dL (ref 6.0–8.5)

## 2016-07-29 NOTE — Telephone Encounter (Signed)
LM to call Dr Alvy Bimler back regarding possible appt tomorrow at [phone] or 1300.

## 2016-07-30 ENCOUNTER — Telehealth: Payer: Self-pay | Admitting: *Deleted

## 2016-07-30 NOTE — Telephone Encounter (Signed)
Pt called back, States he can get to [facility] on Thursday to see Dr Alvy Bimler if she has an opening.

## 2016-07-31 ENCOUNTER — Telehealth: Payer: Self-pay | Admitting: Hematology and Oncology

## 2016-07-31 NOTE — Telephone Encounter (Signed)
Called with below message, verbalized understanding,

## 2016-07-31 NOTE — Telephone Encounter (Signed)
I will send scheduling msg for 3 pm on Thursday He needs to check in a bit sooner

## 2016-07-31 NOTE — Telephone Encounter (Signed)
sw pt to confirm 7/26 appt at 3 pm per sch msg

## 2016-08-01 ENCOUNTER — Ambulatory Visit (HOSPITAL_BASED_OUTPATIENT_CLINIC_OR_DEPARTMENT_OTHER): Payer: Medicare Other | Admitting: Hematology and Oncology

## 2016-08-01 ENCOUNTER — Telehealth: Payer: Self-pay | Admitting: Hematology and Oncology

## 2016-08-01 VITALS — BP 130/55 | HR 65 | Temp 98.2°F | Resp 17 | Ht 71.0 in | Wt 157.5 lb

## 2016-08-01 DIAGNOSIS — G620 Drug-induced polyneuropathy: Secondary | ICD-10-CM

## 2016-08-01 DIAGNOSIS — N183 Chronic kidney disease, stage 3 unspecified: Secondary | ICD-10-CM

## 2016-08-01 DIAGNOSIS — D631 Anemia in chronic kidney disease: Secondary | ICD-10-CM | POA: Diagnosis not present

## 2016-08-01 DIAGNOSIS — N189 Chronic kidney disease, unspecified: Secondary | ICD-10-CM | POA: Diagnosis not present

## 2016-08-01 DIAGNOSIS — C9002 Multiple myeloma in relapse: Secondary | ICD-10-CM | POA: Diagnosis not present

## 2016-08-01 DIAGNOSIS — D61818 Other pancytopenia: Secondary | ICD-10-CM

## 2016-08-01 DIAGNOSIS — D539 Nutritional anemia, unspecified: Secondary | ICD-10-CM

## 2016-08-01 DIAGNOSIS — Z7189 Other specified counseling: Secondary | ICD-10-CM

## 2016-08-01 MED ORDER — POMALIDOMIDE 2 MG PO CAPS: 2.0000 mg | ORAL_CAPSULE | Freq: Every day | ORAL | 11 refills | Status: DC

## 2016-08-01 NOTE — Telephone Encounter (Signed)
Gave patient avs report and appointments for August.  °

## 2016-08-01 NOTE — Patient Instructions (Signed)
Pomalidomide oral capsules °What is this medicine? °POMALIDOMIDE (pom a LID oh mide) is a chemotherapy drug used to treat multiple myeloma. It targets specific proteins within cancer cells and stops the cancer cell from growing. °This medicine may be used for other purposes; ask your health care provider or pharmacist if you have questions. °COMMON BRAND NAME(S): POMALYST °What should I tell my health care provider before I take this medicine? °They need to know if you have any of these conditions: °-high blood pressure °-high cholesterol °-history of blood clots °-irregular monthly periods or menstrual cycles °-kidney disease °-liver disease °-smoke tobacco °-an unusual or allergic reaction to pomalidomide, other medicines, foods, dyes, or preservatives °-pregnant or trying to get pregnant °-breast-feeding °How should I use this medicine? °Take this medicine by mouth with a glass of water. Follow the directions on the prescription label. You can take it with or without food. If it upsets your stomach, take it with food. Do not cut, crush, or chew this medicine. Take your medicine at regular intervals. Do not take it more often than directed. Do not stop taking except on your doctor's advice. °A special MedGuide will be given to you by the pharmacist with each prescription and refill. Be sure to read this information carefully each time. °Talk to your pediatrician regarding the use of this medicine in children. Special care may be needed. °Overdosage: If you think you have taken too much of this medicine contact a poison control center or emergency room at once. °NOTE: This medicine is only for you. Do not share this medicine with others. °What if I miss a dose? °If you miss a dose, take it as soon as you can. If your next dose is to be taken in less than 12 hours, then do not take the missed dose. Take the next dose at your regular time. Do not take double or extra doses. °What may interact with this  medicine? °This medicine may interact with the following medications: °-ciprofloxacin °-fluvoxamine °-tobacco (cigarettes) °This list may not describe all possible interactions. Give your health care provider a list of all the medicines, herbs, non-prescription drugs, or dietary supplements you use. Also tell them if you smoke, drink alcohol, or use illegal drugs. Some items may interact with your medicine. °What should I watch for while using this medicine? °This drug may make you feel generally unwell. This is not uncommon, as chemotherapy can affect healthy cells as well as cancer cells. Report any side effects. Continue your course of treatment even though you feel ill unless your doctor tells you to stop. You may need blood work done while you are taking this medicine. °This medicine is available only through a special program. Doctors, pharmacies, and patients must meet all of the conditions of the program. Your health care provider will help you get signed up with the program if you need this medicine. Through the program you will only receive up to a 28 day supply of the medicine at one time. You will need a new prescription for each refill. °This medicine can cause birth defects. Do not get pregnant while taking this drug. Females with child-bearing potential will need to have 2 negative pregnancy tests before starting this medicine. Pregnancy testing must be done every 2 to 4 weeks as directed while taking this medicine. Use 2 reliable forms of birth control together while you are taking this medicine and for 4 weeks after you stop taking this medicine. If you think that you might   be pregnant talk to your doctor right away. °Men must use a latex condom during sexual contact with a woman while taking this medicine and for 4 weeks after you stop taking this medicine. A latex condom is needed even if you have had a vasectomy. Contact your doctor right away if your partner becomes pregnant. Do not donate sperm  while taking this medicine and for 4 weeks after you stop taking this medicine. °Do not give blood while taking the medicine and for 1 month after completion of treatment to avoid exposing pregnant women to the medicine through the donated blood. °Talk to your doctor about your risk of cancer. You may be more at risk for certain types of cancers if you take this medicine. °If you smoke, tell your doctor if you notice this medicine is not working well for you. Talk to your doctor if you are a smoker or if you decide to stop smoking. °What side effects may I notice from receiving this medicine? °Side effects that you should report to your doctor or health care professional as soon as possible: °-allergic reactions like skin rash, itching or hives, swelling of the face, lips, or tongue °-low blood counts - this medicine may decrease the number of white blood cells, red blood cells and platelets. You may be at increased risk for infections and bleeding °-signs and symptoms of a blood clot such as breathing problems; changes in vision; chest pain; severe, sudden headache; pain, swelling, warmth in the leg; trouble speaking; sudden numbness or weakness of the face, arm or leg °-signs and symptoms of liver injury like dark yellow or brown urine; general ill feeling or flu-like symptoms; light-colored stools; loss of appetite; nausea; right upper belly pain; unusually weak or tired; yellowing of the eyes or skin °-signs and symptoms of a stroke like changes in vision; confusion; trouble speaking or understanding; severe headaches; sudden numbness or weakness of the face, arm or leg; trouble walking; dizziness; loss of balance or coordination °-sweating °-tingling, numbness in the hands or feet °-unusual bleeding or bruising °Side effects that usually do not require medical attention (report to your doctor or health care professional if they continue or are bothersome): °-back  pain °-constipation °-diarrhea °-nausea °-tiredness °This list may not describe all possible side effects. Call your doctor for medical advice about side effects. You may report side effects to FDA at 1-800-FDA-1088. °Where should I keep my medicine? °Keep out of the reach of children. °Store between 20 and 25 degrees C (68 and 77 degrees F). Throw away any unused medicine after the expiration date. °NOTE: This sheet is a summary. It may not cover all possible information. If you have questions about this medicine, talk to your doctor, pharmacist, or health care provider. °© 2018 [employer] (2015-07-28 16:04:46) ° °

## 2016-08-02 ENCOUNTER — Telehealth: Payer: Self-pay | Admitting: Pharmacist

## 2016-08-02 ENCOUNTER — Encounter: Payer: Self-pay | Admitting: Hematology and Oncology

## 2016-08-02 DIAGNOSIS — C9002 Multiple myeloma in relapse: Secondary | ICD-10-CM

## 2016-08-02 MED ORDER — POMALIDOMIDE 2 MG PO CAPS: 2.0000 mg | ORAL_CAPSULE | Freq: Every day | ORAL | 11 refills | Status: DC

## 2016-08-02 NOTE — Assessment & Plan Note (Signed)
I will order further work-up I will start him on low dose Pomalyst

## 2016-08-02 NOTE — Telephone Encounter (Signed)
Oral Oncology Pharmacist Encounter  Received new prescription for Pomalyst for the treatment of relapsed multiple myeloma in conjunction with infusional daratumumab, planned duration until disease progression or unacceptable toxicity.  Labs from 07/24/16 assessed, OK for treatment.  Current medication list in Epic reviewed, no significant DDIs with Pomalyst identified:  Prescription has been e-scribed to [facility] for benefits analysis and approval (this is where patient was receiving his Revlimid).  Oral Oncology Clinic will continue to follow for insurance authorization, copayment issues, initial counseling and start date.  Johny Drilling, PharmD, BCPS, BCOP 08/02/2016 2:10 PM Oral Oncology Clinic [phone]

## 2016-08-02 NOTE — Assessment & Plan Note (Signed)
Unfortunately, despite being on treatment for over the patient had minimum response to treatment The maximum benefit seen is during the first 2 months of treatment, presumably because he was getting weekly 4-1/2 months, Velcade along with Daratumumab The patient is frail with other co-morbidities I have a long discussion with the patient and his wife regarding treatment options including intensifying Velcade injection, introducing Pomalyst along with Daratumumab all switching him to [employer] with Cytoxan combination The patient and his wife are undecided Given his peripheral neuropathy, I felt that intensification of Velcade treatment may not be feasible I recommend introducing Pomalyst along with Daratumumab The combination treatment, based on recent data may yield response rate close to 60% despite patient's being on multiple lines of treatment I do not feel strongly I need to go back on weekly Daratumumab at this point Given his age, comorbidities and pancytopenia, I will start Pomalyst at low dose I would get insurance prior authorization for this It will likely take me almost 2 weeks to get it approved I plan to tentatively start his treatment on August 14, 2016 The risks, benefits, side effects of treatment is discussed with him and his wife and they agreed to proceed The patient is reminded to take aspirin for DVT prophylaxis He will take calcium with vitamin D along with Zometa as well

## 2016-08-02 NOTE — Progress Notes (Signed)
[facility] OFFICE PROGRESS NOTE  Patient Care Team: Lucianne Lei, MD as PCP - General (Family Medicine) Heath Lark, MD as Consulting Physician (Hematology and Oncology) Pleasant, [name], RN as [facility]  SUMMARY OF ONCOLOGIC HISTORY:   Multiple myeloma in relapse [facility])   12/12/2010 Initial Diagnosis    Multiple myeloma      02/16/2014 Imaging    Skeletal survey showed diffuse osteopenia      03/02/2014 Bone Marrow Biopsy    Accession: [id number] BM biopsy showed only 5 % plasma cells. However, the biopsy was difficult and the bone was fragmented during the procedure. Cytogenetics and FISH study is normal      03/03/2014 Procedure    he has port placement      03/10/2014 - 05/19/2014 Chemotherapy    he received elotuzumab and revlimid      05/20/2014 - 02/25/2016 Chemotherapy    Treatment is switched to maintenance Revlimid only. Treatment is stopped due to progressive disease      11/15/2014 Adverse Reaction    He has mild worsening anemia. Does of Revlimid reduced to 5 mg 21 days on, 7 days off      03/06/2016 -  Chemotherapy    He received Daratumumab and Velcade. Velcade is stopped on 07/24/16       INTERVAL HISTORY: Please see below for problem oriented charting. 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 from treatment from [facility]:   Constitutional: Denies fevers, chills or abnormal weight loss Eyes: Denies blurriness of vision Ears, nose, mouth, throat, and face: Denies mucositis or sore throat Respiratory: Denies cough, dyspnea or wheezes Cardiovascular: Denies palpitation, chest discomfort or lower extremity swelling Gastrointestinal:  Denies nausea, heartburn or change in bowel habits Skin: Denies abnormal skin rashes Lymphatics: Denies new lymphadenopathy or easy  bruising Neurological:Denies numbness, tingling or new weaknesses Behavioral/Psych: Mood is stable, no new changes  All other systems were reviewed with the patient and are negative.  I have reviewed the past medical history, past surgical history, social history and family history with the patient and they are unchanged from previous note.  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  MEDICATIONS:  Current Outpatient Prescriptions  Medication Sig Dispense Refill  . acyclovir (ZOVIRAX) 400 MG tablet Take 1 tablet (400 mg total) by mouth daily. 60 tablet 11  . allopurinol (ZYLOPRIM) 100 MG tablet Take 100 mg by mouth 2 (two) times daily.     Marland Kitchen amoxicillin-clavulanate (AUGMENTIN) 875-125 MG tablet Take 1 tablet by mouth every 12 (twelve) hours. 6 tablet 0  . aspirin EC 81 MG tablet Take 81 mg by mouth daily.    Marland Kitchen atorvastatin (LIPITOR) 20 MG tablet Take 20 mg by mouth at bedtime.     . brimonidine (ALPHAGAN) 0.15 % ophthalmic solution Place 1 drop into the right eye 3 (three) times daily.    . cholecalciferol (VITAMIN D) 1000 UNITS tablet Take 1,000 Units by mouth daily.    Marland Kitchen dexamethasone (DECADRON) 4 MG tablet Take 5 tablets every Mondays 20 tablet 4  . dorzolamide-timolol (COSOPT) 22.3-6.8 MG/ML ophthalmic solution Place 1 drop into both eyes 2 (two) times daily.     . fluorometholone (FML) 0.1 % ophthalmic suspension Place 1 drop into both eyes daily.    Marland Kitchen latanoprost (XALATAN) 0.005 % ophthalmic solution Place  1 drop into the right eye at bedtime.    . lidocaine-prilocaine (EMLA) cream Apply 1 application topically as needed. 30 g 1  . lidocaine-prilocaine (EMLA) cream Apply to affected area once 30 g 3  . loperamide (IMODIUM A-D) 2 MG tablet 1 po after each watery BM.  Maximum 6 per 24hrs. 30 tablet PRN  . ondansetron (ZOFRAN) 8 MG tablet Take 1 tablet (8 mg total) by mouth 2 (two) times daily as needed (Nausea or vomiting). 30 tablet 1  . pomalidomide (POMALYST) 2 MG capsule  Take 1 capsule (2 mg total) by mouth daily. Take with water on days 1-21. Repeat every 28 days. 21 capsule 11  . prochlorperazine (COMPAZINE) 10 MG tablet Take 1 tablet (10 mg total) by mouth every 6 (six) hours as needed (Nausea or vomiting). 30 tablet 1   No current facility-administered medications for this visit.     PHYSICAL EXAMINATION: ECOG PERFORMANCE STATUS: 2 - Symptomatic, <50% confined to bed  Vitals:   08/01/16 1506  BP: (!) 130/55  Pulse: 65  Resp: 17  Temp: 98.2 F (36.8 C)   Filed Weights   08/01/16 1506  Weight: 157 lb 8 oz (71.4 kg)    GENERAL:alert, no distress and comfortable.  He is elderly and frail SKIN: skin color, texture, turgor are normal, no rashes or significant lesions EYES: normal, Conjunctiva are pink and non-injected, sclera clear OROPHARYNX:no exudate, no erythema and lips, buccal mucosa, and tongue normal  NECK: supple, thyroid normal size, non-tender, without nodularity LYMPH:  no palpable lymphadenopathy in the cervical, axillary or inguinal LUNGS: clear to auscultation and percussion with normal breathing effort HEART: regular rate & rhythm and no murmurs and no lower extremity edema ABDOMEN:abdomen soft, non-tender and normal bowel sounds Musculoskeletal:no cyanosis of digits and no clubbing  NEURO: alert & oriented x 3 with fluent speech, no focal motor/sensory deficits  LABORATORY DATA:  I have reviewed the data as listed    Component Value Date/Time   NA 138 07/24/2016 0806   K 4.0 07/24/2016 0806   CL 108 04/10/2015 0405   CL 107 06/29/2012 1131   CO2 20 (L) 07/24/2016 0806   GLUCOSE 91 07/24/2016 0806   GLUCOSE 143 (H) 06/29/2012 1131   BUN 22.5 07/24/2016 0806   CREATININE 1.5 (H) 07/24/2016 0806   CALCIUM 8.7 07/24/2016 0806   PROT 6.1 07/24/2016 0806   PROT 6.6 07/24/2016 0806   ALBUMIN 3.0 (L) 07/24/2016 0806   AST 22 07/24/2016 0806   ALT 33 07/24/2016 0806   ALKPHOS 57 07/24/2016 0806   BILITOT 0.35 07/24/2016  0806   GFRNONAA 38 (L) 04/10/2015 0405   GFRAA 44 (L) 04/10/2015 0405    No results found for: SPEP, UPEP  Lab Results  Component Value Date   WBC 9.9 07/24/2016   NEUTROABS 7.3 (H) 07/24/2016   HGB 8.5 (L) 07/24/2016   HCT 25.2 (L) 07/24/2016   MCV 93.7 07/24/2016   PLT 141 07/24/2016      Chemistry      Component Value Date/Time   NA 138 07/24/2016 0806   K 4.0 07/24/2016 0806   CL 108 04/10/2015 0405   CL 107 06/29/2012 1131   CO2 20 (L) 07/24/2016 0806   BUN 22.5 07/24/2016 0806   CREATININE 1.5 (H) 07/24/2016 0806      Component Value Date/Time   CALCIUM 8.7 07/24/2016 0806   ALKPHOS 57 07/24/2016 0806   AST 22 07/24/2016 0806   ALT 33 07/24/2016 0806  BILITOT 0.35 07/24/2016 0806     ASSESSMENT & PLAN:  Multiple myeloma in relapse ([place]) Unfortunately, despite being on treatment for over the patient had minimum response to treatment The maximum benefit seen is during the first 2 months of treatment, presumably because he was getting weekly 4-1/2 months, Velcade along with Daratumumab The patient is frail with other co-morbidities I have a long discussion with the patient and his wife regarding treatment options including intensifying Velcade injection, introducing Pomalyst along with Daratumumab all switching him to [employer] with Cytoxan combination The patient and his wife are undecided Given his peripheral neuropathy, I felt that intensification of Velcade treatment may not be feasible I recommend introducing Pomalyst along with Daratumumab The combination treatment, based on recent data may yield response rate close to 60% despite patient's being on multiple lines of treatment I do not feel strongly I need to go back on weekly Daratumumab at this point Given his age, comorbidities and pancytopenia, I will start Pomalyst at low dose I would get insurance prior authorization for this It will likely take me almost 2 weeks to get it approved I plan to  tentatively start his treatment on August 14, 2016 The risks, benefits, side effects of treatment is discussed with him and his wife and they agreed to proceed The patient is reminded to take aspirin for DVT prophylaxis He will take calcium with vitamin D along with Zometa as well  Anemia in chronic renal disease I will order further work-up I will start him on low dose Pomalyst  Goals of care, counseling/discussion The patient is aware he has incurable disease and treatment is strictly palliative. We discussed importance of Advanced Directives and Living will.    Orders Placed This Encounter  Procedures  . Ferritin    Standing Status:   Future    Standing Expiration Date:   09/05/2017  . Iron and TIBC    Standing Status:   Future    Standing Expiration Date:   09/05/2017  . Vitamin B12    Standing Status:   Future    Standing Expiration Date:   09/05/2017  . Erythropoietin    Standing Status:   Future    Standing Expiration Date:   09/05/2017  . Folate RBC    Standing Status:   Future    Standing Expiration Date:   09/05/2017   All questions were answered. The patient knows to call the clinic with any problems, questions or concerns. No barriers to learning was detected. I spent 25 minutes counseling the patient face to face. The total time spent in the appointment was 40 minutes and more than 50% was on counseling and review of test results     Heath Lark, MD 08/02/2016 7:58 AM

## 2016-08-02 NOTE — Assessment & Plan Note (Signed)
The patient is aware he has incurable disease and treatment is strictly palliative. We discussed importance of Advanced Directives and Living will.  

## 2016-08-14 ENCOUNTER — Other Ambulatory Visit (HOSPITAL_BASED_OUTPATIENT_CLINIC_OR_DEPARTMENT_OTHER): Payer: Medicare Other

## 2016-08-14 ENCOUNTER — Ambulatory Visit (HOSPITAL_BASED_OUTPATIENT_CLINIC_OR_DEPARTMENT_OTHER): Payer: Medicare Other | Admitting: Hematology and Oncology

## 2016-08-14 ENCOUNTER — Encounter: Payer: Self-pay | Admitting: Hematology and Oncology

## 2016-08-14 ENCOUNTER — Ambulatory Visit (HOSPITAL_BASED_OUTPATIENT_CLINIC_OR_DEPARTMENT_OTHER): Payer: Medicare Other

## 2016-08-14 VITALS — BP 120/59 | HR 59 | Temp 97.5°F | Resp 17

## 2016-08-14 DIAGNOSIS — C9002 Multiple myeloma in relapse: Secondary | ICD-10-CM

## 2016-08-14 DIAGNOSIS — Z95828 Presence of other vascular implants and grafts: Secondary | ICD-10-CM | POA: Diagnosis not present

## 2016-08-14 DIAGNOSIS — D539 Nutritional anemia, unspecified: Secondary | ICD-10-CM | POA: Diagnosis not present

## 2016-08-14 DIAGNOSIS — D61818 Other pancytopenia: Secondary | ICD-10-CM

## 2016-08-14 DIAGNOSIS — N183 Chronic kidney disease, stage 3 unspecified: Secondary | ICD-10-CM

## 2016-08-14 DIAGNOSIS — C9001 Multiple myeloma in remission: Secondary | ICD-10-CM

## 2016-08-14 LAB — FERRITIN: Ferritin: 61 ng/ml (ref 22–316)

## 2016-08-14 LAB — COMPREHENSIVE METABOLIC PANEL
ALBUMIN: 3.2 g/dL — AB (ref 3.5–5.0)
ALK PHOS: 57 U/L (ref 40–150)
ALT: 29 U/L (ref 0–55)
AST: 19 U/L (ref 5–34)
Anion Gap: 11 mEq/L (ref 3–11)
BILIRUBIN TOTAL: 0.44 mg/dL (ref 0.20–1.20)
BUN: 30.4 mg/dL — ABNORMAL HIGH (ref 7.0–26.0)
CALCIUM: 9.1 mg/dL (ref 8.4–10.4)
CO2: 19 mEq/L — ABNORMAL LOW (ref 22–29)
Chloride: 106 mEq/L (ref 98–109)
Creatinine: 1.7 mg/dL — ABNORMAL HIGH (ref 0.7–1.3)
EGFR: 46 mL/min/{1.73_m2} — AB (ref >90)
Glucose: 135 mg/dl (ref 70–140)
POTASSIUM: 3.9 meq/L (ref 3.5–5.1)
Sodium: 135 mEq/L — ABNORMAL LOW (ref 136–145)
Total Protein: 7.1 g/dL (ref 6.4–8.3)

## 2016-08-14 LAB — CBC WITH DIFFERENTIAL/PLATELET
BASO%: 0.1 % (ref 0.0–2.0)
BASOS ABS: 0 10*3/uL (ref 0.0–0.1)
EOS ABS: 0 10*3/uL (ref 0.0–0.5)
EOS%: 0 % (ref 0.0–7.0)
HEMATOCRIT: 26.2 % — AB (ref 38.4–49.9)
HEMOGLOBIN: 8.6 g/dL — AB (ref 13.0–17.1)
LYMPH#: 2.1 10*3/uL (ref 0.9–3.3)
LYMPH%: 15.6 % (ref 14.0–49.0)
MCH: 30.8 pg (ref 27.2–33.4)
MCHC: 32.7 g/dL (ref 32.0–36.0)
MCV: 94.1 fL (ref 79.3–98.0)
MONO#: 0.6 10*3/uL (ref 0.1–0.9)
MONO%: 4.6 % (ref 0.0–14.0)
NEUT#: 10.9 10*3/uL — ABNORMAL HIGH (ref 1.5–6.5)
NEUT%: 79.7 % — ABNORMAL HIGH (ref 39.0–75.0)
PLATELETS: 150 10*3/uL (ref 140–400)
RBC: 2.79 10*6/uL — ABNORMAL LOW (ref 4.20–5.82)
RDW: 17.4 % — AB (ref 11.0–14.6)
WBC: 13.7 10*3/uL — ABNORMAL HIGH (ref 4.0–10.3)

## 2016-08-14 LAB — IRON AND TIBC
%SAT: 15 % — ABNORMAL LOW (ref 20–55)
Iron: 55 ug/dL (ref 42–163)
TIBC: 374 ug/dL (ref 202–409)
UIBC: 319 ug/dL (ref 117–376)

## 2016-08-14 MED ORDER — METHYLPREDNISOLONE SODIUM SUCC 40 MG IJ SOLR
40.0000 mg | Freq: Once | INTRAMUSCULAR | Status: AC
  Administered 2016-08-14: 40 mg via INTRAVENOUS

## 2016-08-14 MED ORDER — ACETAMINOPHEN 325 MG PO TABS
ORAL_TABLET | ORAL | Status: AC
  Filled 2016-08-14: qty 2

## 2016-08-14 MED ORDER — PROCHLORPERAZINE MALEATE 10 MG PO TABS
10.0000 mg | ORAL_TABLET | Freq: Once | ORAL | Status: AC
  Administered 2016-08-14: 10 mg via ORAL

## 2016-08-14 MED ORDER — HEPARIN SOD (PORK) LOCK FLUSH 100 UNIT/ML IV SOLN
500.0000 [IU] | Freq: Once | INTRAVENOUS | Status: DC | PRN
  Filled 2016-08-14: qty 5

## 2016-08-14 MED ORDER — ACETAMINOPHEN 325 MG PO TABS
650.0000 mg | ORAL_TABLET | Freq: Once | ORAL | Status: AC
  Administered 2016-08-14: 650 mg via ORAL

## 2016-08-14 MED ORDER — SODIUM CHLORIDE 0.9% FLUSH
10.0000 mL | INTRAVENOUS | Status: DC | PRN
  Filled 2016-08-14: qty 10

## 2016-08-14 MED ORDER — METHYLPREDNISOLONE SODIUM SUCC 40 MG IJ SOLR
INTRAMUSCULAR | Status: AC
  Filled 2016-08-14: qty 1

## 2016-08-14 MED ORDER — PROCHLORPERAZINE MALEATE 10 MG PO TABS
ORAL_TABLET | ORAL | Status: AC
  Filled 2016-08-14: qty 1

## 2016-08-14 MED ORDER — DIPHENHYDRAMINE HCL 25 MG PO CAPS
ORAL_CAPSULE | ORAL | Status: AC
  Filled 2016-08-14: qty 2

## 2016-08-14 MED ORDER — DIPHENHYDRAMINE HCL 25 MG PO CAPS
50.0000 mg | ORAL_CAPSULE | Freq: Once | ORAL | Status: AC
  Administered 2016-08-14: 50 mg via ORAL

## 2016-08-14 MED ORDER — ZOLEDRONIC ACID 4 MG/5ML IV CONC
3.0000 mg | Freq: Once | INTRAVENOUS | Status: AC
  Administered 2016-08-14: 3 mg via INTRAVENOUS
  Filled 2016-08-14: qty 3.75

## 2016-08-14 MED ORDER — SODIUM CHLORIDE 0.9 % IV SOLN
15.2000 mg/kg | Freq: Once | INTRAVENOUS | Status: AC
  Administered 2016-08-14: 1200 mg via INTRAVENOUS
  Filled 2016-08-14: qty 60

## 2016-08-14 MED ORDER — SODIUM CHLORIDE 0.9 % IV SOLN
Freq: Once | INTRAVENOUS | Status: AC
  Administered 2016-08-14: 10:00:00 via INTRAVENOUS

## 2016-08-15 LAB — KAPPA/LAMBDA LIGHT CHAINS
IG KAPPA FREE LIGHT CHAIN: 91.5 mg/L — AB (ref 3.3–19.4)
Ig Lambda Free Light Chain: 2.3 mg/L — ABNORMAL LOW (ref 5.7–26.3)
Kappa/Lambda FluidC Ratio: 39.78 — ABNORMAL HIGH (ref 0.26–1.65)

## 2016-08-15 LAB — FOLATE RBC
Folate, Hemolysate: 324.9 ng/mL
HEMATOCRIT: 24.9 % — AB (ref 37.5–51.0)

## 2016-08-15 LAB — ERYTHROPOIETIN: ERYTHROPOIETIN: 46.8 m[IU]/mL — AB (ref 2.6–18.5)

## 2016-08-15 LAB — VITAMIN B12: VITAMIN B 12: 318 pg/mL (ref 232–1245)

## 2016-08-16 LAB — MULTIPLE MYELOMA PANEL, SERUM
ALBUMIN SERPL ELPH-MCNC: 3.3 g/dL (ref 2.9–4.4)
ALBUMIN/GLOB SERPL: 1 (ref 0.7–1.7)
ALPHA 1: 0.3 g/dL (ref 0.0–0.4)
ALPHA2 GLOB SERPL ELPH-MCNC: 0.8 g/dL (ref 0.4–1.0)
B-Globulin SerPl Elph-Mcnc: 1 g/dL (ref 0.7–1.3)
GAMMA GLOB SERPL ELPH-MCNC: 1.5 g/dL (ref 0.4–1.8)
GLOBULIN, TOTAL: 3.6 g/dL (ref 2.2–3.9)
IgA, Qn, Serum: 8 mg/dL — ABNORMAL LOW (ref 61–437)
IgM, Qn, Serum: 7 mg/dL — ABNORMAL LOW (ref 15–143)
M Protein SerPl Elph-Mcnc: 1.3 g/dL — ABNORMAL HIGH
Total Protein: 6.9 g/dL (ref 6.0–8.5)

## 2016-08-16 NOTE — Progress Notes (Signed)
[place] OFFICE PROGRESS NOTE  Patient Care Team: Lucianne Lei, MD as PCP - General (Family Medicine) Heath Lark, MD as Consulting Physician (Hematology and Oncology) Pleasant, [name], RN as [place] Management  SUMMARY OF ONCOLOGIC HISTORY:   Multiple myeloma in relapse [facility])   12/12/2010 Initial Diagnosis    Multiple myeloma      02/16/2014 Imaging    Skeletal survey showed diffuse osteopenia      03/02/2014 Bone Marrow Biopsy    Accession: [id number] BM biopsy showed only 5 % plasma cells. However, the biopsy was difficult and the bone was fragmented during the procedure. Cytogenetics and FISH study is normal      03/03/2014 Procedure    he has port placement      03/10/2014 - 05/19/2014 Chemotherapy    he received elotuzumab and revlimid      05/20/2014 - 02/25/2016 Chemotherapy    Treatment is switched to maintenance Revlimid only. Treatment is stopped due to progressive disease      11/15/2014 Adverse Reaction    He has mild worsening anemia. Does of Revlimid reduced to 5 mg 21 days on, 7 days off      03/06/2016 -  Chemotherapy    He received Daratumumab and Velcade. Velcade is stopped on 07/24/16      08/14/2016 Miscellaneous    Pomalyst is added along with Daratumumab       INTERVAL HISTORY: Please see below for problem oriented charting. He returns for further follow-up. He denies recent infection, fever or chills No new bone pain. Appetite is stable, no recent weight change.  REVIEW OF SYSTEMS:   Constitutional: Denies fevers, chills or abnormal weight loss Eyes: Denies blurriness of vision Ears, nose, mouth, throat, and face: Denies mucositis or sore throat Respiratory: Denies cough, dyspnea or wheezes Cardiovascular: Denies palpitation, chest discomfort or lower extremity swelling Gastrointestinal:  Denies nausea, heartburn or change in bowel habits Skin: Denies abnormal skin rashes Lymphatics: Denies new  lymphadenopathy or easy bruising Neurological:Denies numbness, tingling or new weaknesses Behavioral/Psych: Mood is stable, no new changes  All other systems were reviewed with the patient and are negative.  I have reviewed the past medical history, past surgical history, social history and family history with the patient and they are unchanged from previous note.  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  MEDICATIONS:  Current Outpatient Prescriptions  Medication Sig Dispense Refill  . acyclovir (ZOVIRAX) 400 MG tablet Take 1 tablet (400 mg total) by mouth daily. 60 tablet 11  . allopurinol (ZYLOPRIM) 100 MG tablet Take 100 mg by mouth 2 (two) times daily.     Marland Kitchen amoxicillin-clavulanate (AUGMENTIN) 875-125 MG tablet Take 1 tablet by mouth every 12 (twelve) hours. 6 tablet 0  . aspirin EC 81 MG tablet Take 81 mg by mouth daily.    Marland Kitchen atorvastatin (LIPITOR) 20 MG tablet Take 20 mg by mouth at bedtime.     . brimonidine (ALPHAGAN) 0.15 % ophthalmic solution Place 1 drop into the right eye 3 (three) times daily.    . cholecalciferol (VITAMIN D) 1000 UNITS tablet Take 1,000 Units by mouth daily.    Marland Kitchen dexamethasone (DECADRON) 4 MG tablet Take 5 tablets every Mondays 20 tablet 4  . dorzolamide-timolol (COSOPT) 22.3-6.8 MG/ML ophthalmic solution Place 1 drop into both eyes 2 (two) times daily.     . fluorometholone (FML) 0.1 % ophthalmic suspension Place 1 drop into both eyes daily.    Marland Kitchen latanoprost (  XALATAN) 0.005 % ophthalmic solution Place 1 drop into the right eye at bedtime.    . lidocaine-prilocaine (EMLA) cream Apply 1 application topically as needed. 30 g 1  . lidocaine-prilocaine (EMLA) cream Apply to affected area once 30 g 3  . loperamide (IMODIUM A-D) 2 MG tablet 1 po after each watery BM.  Maximum 6 per 24hrs. 30 tablet PRN  . ondansetron (ZOFRAN) 8 MG tablet Take 1 tablet (8 mg total) by mouth 2 (two) times daily as needed (Nausea or vomiting). 30 tablet 1  . pomalidomide  (POMALYST) 2 MG capsule Take 1 capsule (2 mg total) by mouth daily. Take with water on days 1-21. Repeat every 28 days. 21 capsule 11  . prochlorperazine (COMPAZINE) 10 MG tablet Take 1 tablet (10 mg total) by mouth every 6 (six) hours as needed (Nausea or vomiting). 30 tablet 1   No current facility-administered medications for this visit.     PHYSICAL EXAMINATION: ECOG PERFORMANCE STATUS: 1 - Symptomatic but completely ambulatory  GENERAL:alert, no distress and comfortable SKIN: skin color, texture, turgor are normal, no rashes or significant lesions EYES: normal, Conjunctiva are pink and non-injected, sclera clear OROPHARYNX:no exudate, no erythema and lips, buccal mucosa, and tongue normal  NECK: supple, thyroid normal size, non-tender, without nodularity LYMPH:  no palpable lymphadenopathy in the cervical, axillary or inguinal LUNGS: clear to auscultation and percussion with normal breathing effort HEART: regular rate & rhythm and no murmurs and no lower extremity edema ABDOMEN:abdomen soft, non-tender and normal bowel sounds Musculoskeletal:no cyanosis of digits and no clubbing  NEURO: alert & oriented x 3 with fluent speech, no focal motor/sensory deficits  LABORATORY DATA:  I have reviewed the data as listed    Component Value Date/Time   NA 135 (L) 08/14/2016 0917   K 3.9 08/14/2016 0917   CL 108 04/10/2015 0405   CL 107 06/29/2012 1131   CO2 19 (L) 08/14/2016 0917   GLUCOSE 135 08/14/2016 0917   GLUCOSE 143 (H) 06/29/2012 1131   BUN 30.4 (H) 08/14/2016 0917   CREATININE 1.7 (H) 08/14/2016 0917   CALCIUM 9.1 08/14/2016 0917   PROT 7.1 08/14/2016 0917   PROT 6.9 08/14/2016 0917   ALBUMIN 3.2 (L) 08/14/2016 0917   AST 19 08/14/2016 0917   ALT 29 08/14/2016 0917   ALKPHOS 57 08/14/2016 0917   BILITOT 0.44 08/14/2016 0917   GFRNONAA 38 (L) 04/10/2015 0405   GFRAA 44 (L) 04/10/2015 0405    No results found for: SPEP, UPEP  Lab Results  Component Value Date   WBC  13.7 (H) 08/14/2016   NEUTROABS 10.9 (H) 08/14/2016   HGB 8.6 (L) 08/14/2016   HCT 24.9 (L) 08/14/2016   HCT 26.2 (L) 08/14/2016   MCV 94.1 08/14/2016   PLT 150 08/14/2016      Chemistry      Component Value Date/Time   NA 135 (L) 08/14/2016 0917   K 3.9 08/14/2016 0917   CL 108 04/10/2015 0405   CL 107 06/29/2012 1131   CO2 19 (L) 08/14/2016 0917   BUN 30.4 (H) 08/14/2016 0917   CREATININE 1.7 (H) 08/14/2016 0917      Component Value Date/Time   CALCIUM 9.1 08/14/2016 0917   ALKPHOS 57 08/14/2016 0917   AST 19 08/14/2016 0917   ALT 29 08/14/2016 0917   BILITOT 0.44 08/14/2016 0917       ASSESSMENT & PLAN:  Multiple myeloma in relapse ([place]) We reviewed his recent blood work Given progression of  disease, he is starting on Pomalys on 08/14/2016. We will see him back next week for assessment of toxicity He will continue Daratumumab as scheduled  Pancytopenia, acquired ([place]) He is not symptomatic from anemia or thrombocytopenia. I will continue to treatment without dose adjustment  Chronic renal insufficiency, stage III (moderate) This is chronic in nature. It is stable. Recommend observation only. We'll give him a reduced dose Zometa twice a year due to chronic renal failure He does not require dose adjustment for his chemotherapy.    No orders of the defined types were placed in this encounter.  All questions were answered. The patient knows to call the clinic with any problems, questions or concerns. No barriers to learning was detected. I spent 15 minutes counseling the patient face to face. The total time spent in the appointment was 20 minutes and more than 50% was on counseling and review of test results     Heath Lark, MD 08/16/2016 4:05 PM

## 2016-08-16 NOTE — Assessment & Plan Note (Signed)
This is chronic in nature. It is stable. Recommend observation only. We'll give him a reduced dose Zometa twice a year due to chronic renal failure He does not require dose adjustment for his chemotherapy.  

## 2016-08-16 NOTE — Assessment & Plan Note (Signed)
We reviewed his recent blood work Given progression of disease, he is starting on Pomalys on 08/14/2016. We will see him back next week for assessment of toxicity He will continue Daratumumab as scheduled

## 2016-08-16 NOTE — Assessment & Plan Note (Signed)
He is not symptomatic from anemia or thrombocytopenia. I will continue to treatment without dose adjustment

## 2016-08-21 ENCOUNTER — Telehealth: Payer: Self-pay | Admitting: Hematology and Oncology

## 2016-08-21 ENCOUNTER — Other Ambulatory Visit (HOSPITAL_BASED_OUTPATIENT_CLINIC_OR_DEPARTMENT_OTHER): Payer: Medicare Other

## 2016-08-21 ENCOUNTER — Ambulatory Visit (HOSPITAL_BASED_OUTPATIENT_CLINIC_OR_DEPARTMENT_OTHER): Payer: Medicare Other | Admitting: Hematology and Oncology

## 2016-08-21 VITALS — BP 117/58 | HR 71 | Temp 97.7°F | Resp 18 | Ht 71.0 in | Wt 157.5 lb

## 2016-08-21 DIAGNOSIS — D509 Iron deficiency anemia, unspecified: Secondary | ICD-10-CM

## 2016-08-21 DIAGNOSIS — D539 Nutritional anemia, unspecified: Secondary | ICD-10-CM

## 2016-08-21 DIAGNOSIS — N183 Chronic kidney disease, stage 3 unspecified: Secondary | ICD-10-CM

## 2016-08-21 DIAGNOSIS — D631 Anemia in chronic kidney disease: Secondary | ICD-10-CM | POA: Diagnosis not present

## 2016-08-21 DIAGNOSIS — D630 Anemia in neoplastic disease: Secondary | ICD-10-CM | POA: Diagnosis not present

## 2016-08-21 DIAGNOSIS — C9001 Multiple myeloma in remission: Secondary | ICD-10-CM

## 2016-08-21 DIAGNOSIS — C9002 Multiple myeloma in relapse: Secondary | ICD-10-CM | POA: Diagnosis not present

## 2016-08-21 LAB — COMPREHENSIVE METABOLIC PANEL
ALBUMIN: 3 g/dL — AB (ref 3.5–5.0)
ALT: 25 U/L (ref 0–55)
AST: 15 U/L (ref 5–34)
Alkaline Phosphatase: 55 U/L (ref 40–150)
Anion Gap: 7 mEq/L (ref 3–11)
BUN: 27.1 mg/dL — AB (ref 7.0–26.0)
CALCIUM: 9.5 mg/dL (ref 8.4–10.4)
CHLORIDE: 105 meq/L (ref 98–109)
CO2: 22 mEq/L (ref 22–29)
CREATININE: 1.7 mg/dL — AB (ref 0.7–1.3)
EGFR: 47 mL/min/{1.73_m2} — ABNORMAL LOW (ref >90)
GLUCOSE: 136 mg/dL (ref 70–140)
POTASSIUM: 3.8 meq/L (ref 3.5–5.1)
SODIUM: 135 meq/L — AB (ref 136–145)
Total Bilirubin: 0.35 mg/dL (ref 0.20–1.20)
Total Protein: 6.6 g/dL (ref 6.4–8.3)

## 2016-08-21 LAB — CBC WITH DIFFERENTIAL/PLATELET
BASO%: 0.2 % (ref 0.0–2.0)
BASOS ABS: 0 10*3/uL (ref 0.0–0.1)
EOS%: 0.2 % (ref 0.0–7.0)
Eosinophils Absolute: 0 10*3/uL (ref 0.0–0.5)
HEMATOCRIT: 25.2 % — AB (ref 38.4–49.9)
HEMOGLOBIN: 8.3 g/dL — AB (ref 13.0–17.1)
LYMPH#: 1.8 10*3/uL (ref 0.9–3.3)
LYMPH%: 17 % (ref 14.0–49.0)
MCH: 31.2 pg (ref 27.2–33.4)
MCHC: 32.8 g/dL (ref 32.0–36.0)
MCV: 95.1 fL (ref 79.3–98.0)
MONO#: 0.7 10*3/uL (ref 0.1–0.9)
MONO%: 6.8 % (ref 0.0–14.0)
NEUT#: 8.2 10*3/uL — ABNORMAL HIGH (ref 1.5–6.5)
NEUT%: 75.8 % — ABNORMAL HIGH (ref 39.0–75.0)
Platelets: 150 10*3/uL (ref 140–400)
RBC: 2.65 10*6/uL — ABNORMAL LOW (ref 4.20–5.82)
RDW: 17.9 % — AB (ref 11.0–14.6)
WBC: 10.8 10*3/uL — ABNORMAL HIGH (ref 4.0–10.3)

## 2016-08-21 NOTE — Telephone Encounter (Signed)
Left voicemail with pt regarding his appt on 8/29. Sending her a reminder letter in the mail.

## 2016-08-21 NOTE — Telephone Encounter (Signed)
Gave pt avs and calendar for appts. Contacting Cindy about adding an 8hr infusion on 8/27.

## 2016-08-22 ENCOUNTER — Encounter: Payer: Self-pay | Admitting: Hematology and Oncology

## 2016-08-22 LAB — KAPPA/LAMBDA LIGHT CHAINS
IG LAMBDA FREE LIGHT CHAIN: 2.9 mg/L — AB (ref 5.7–26.3)
Ig Kappa Free Light Chain: 61.9 mg/L — ABNORMAL HIGH (ref 3.3–19.4)
KAPPA/LAMBDA FLC RATIO: 21.34 — AB (ref 0.26–1.65)

## 2016-08-22 NOTE — Progress Notes (Signed)
[place] OFFICE PROGRESS NOTE  Patient Care Team: Lucianne Lei, MD as PCP - General (Family Medicine) Heath Lark, MD as Consulting Physician (Hematology and Oncology) Pleasant, [name], RN as [place] Management  SUMMARY OF ONCOLOGIC HISTORY:   Multiple myeloma in relapse [facility])   12/12/2010 Initial Diagnosis    Multiple myeloma      02/16/2014 Imaging    Skeletal survey showed diffuse osteopenia      03/02/2014 Bone Marrow Biopsy    Accession: [id number] BM biopsy showed only 5 % plasma cells. However, the biopsy was difficult and the bone was fragmented during the procedure. Cytogenetics and FISH study is normal      03/03/2014 Procedure    he has port placement      03/10/2014 - 05/19/2014 Chemotherapy    he received elotuzumab and revlimid      05/20/2014 - 02/25/2016 Chemotherapy    Treatment is switched to maintenance Revlimid only. Treatment is stopped due to progressive disease      11/15/2014 Adverse Reaction    He has mild worsening anemia. Does of Revlimid reduced to 5 mg 21 days on, 7 days off      03/06/2016 -  Chemotherapy    He received Daratumumab and Velcade. Velcade is stopped on 07/24/16      08/14/2016 Miscellaneous    Pomalyst is added along with Daratumumab       INTERVAL HISTORY: Please see below for problem oriented charting. He returns for further follow-up He feels well Denies fatigue, chest pain or shortness of breath He tolerated addition of Pomalyst well without nausea or other side effects No new bone pain Denies recent infection. The patient denies any recent signs or symptoms of bleeding such as spontaneous epistaxis, hematuria or hematochezia.  REVIEW OF SYSTEMS:   Constitutional: Denies fevers, chills or abnormal weight loss Eyes: Denies blurriness of vision Ears, nose, mouth, throat, and face: Denies mucositis or sore throat Respiratory: Denies cough, dyspnea or wheezes Cardiovascular: Denies  palpitation, chest discomfort or lower extremity swelling Gastrointestinal:  Denies nausea, heartburn or change in bowel habits Skin: Denies abnormal skin rashes Lymphatics: Denies new lymphadenopathy or easy bruising Neurological:Denies numbness, tingling or new weaknesses Behavioral/Psych: Mood is stable, no new changes  All other systems were reviewed with the patient and are negative.  I have reviewed the past medical history, past surgical history, social history and family history with the patient and they are unchanged from previous note.  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  MEDICATIONS:  Current Outpatient Prescriptions  Medication Sig Dispense Refill  . acyclovir (ZOVIRAX) 400 MG tablet Take 1 tablet (400 mg total) by mouth daily. 60 tablet 11  . allopurinol (ZYLOPRIM) 100 MG tablet Take 100 mg by mouth 2 (two) times daily.     Marland Kitchen aspirin EC 81 MG tablet Take 81 mg by mouth daily.    Marland Kitchen atorvastatin (LIPITOR) 20 MG tablet Take 20 mg by mouth at bedtime.     . brimonidine (ALPHAGAN) 0.15 % ophthalmic solution Place 1 drop into the right eye 3 (three) times daily.    . cholecalciferol (VITAMIN D) 1000 UNITS tablet Take 1,000 Units by mouth daily.    Marland Kitchen dexamethasone (DECADRON) 4 MG tablet Take 5 tablets every Mondays 20 tablet 4  . dorzolamide-timolol (COSOPT) 22.3-6.8 MG/ML ophthalmic solution Place 1 drop into both eyes 2 (two) times daily.     . fluorometholone (FML) 0.1 % ophthalmic suspension Place 1  drop into both eyes daily.    Marland Kitchen latanoprost (XALATAN) 0.005 % ophthalmic solution Place 1 drop into the right eye at bedtime.    . lidocaine-prilocaine (EMLA) cream Apply 1 application topically as needed. 30 g 1  . lidocaine-prilocaine (EMLA) cream Apply to affected area once 30 g 3  . loperamide (IMODIUM A-D) 2 MG tablet 1 po after each watery BM.  Maximum 6 per 24hrs. 30 tablet PRN  . ondansetron (ZOFRAN) 8 MG tablet Take 1 tablet (8 mg total) by mouth 2 (two)  times daily as needed (Nausea or vomiting). 30 tablet 1  . pomalidomide (POMALYST) 2 MG capsule Take 1 capsule (2 mg total) by mouth daily. Take with water on days 1-21. Repeat every 28 days. 21 capsule 11  . prochlorperazine (COMPAZINE) 10 MG tablet Take 1 tablet (10 mg total) by mouth every 6 (six) hours as needed (Nausea or vomiting). 30 tablet 1   No current facility-administered medications for this visit.     PHYSICAL EXAMINATION: ECOG PERFORMANCE STATUS: 1 - Symptomatic but completely ambulatory  Vitals:   08/21/16 1008  BP: (!) 117/58  Pulse: 71  Resp: 18  Temp: 97.7 F (36.5 C)  SpO2: 100%   Filed Weights   08/21/16 1008  Weight: 157 lb 8 oz (71.4 kg)    GENERAL:alert, no distress and comfortable SKIN: skin color, texture, turgor are normal, no rashes or significant lesions EYES: normal, Conjunctiva are pink and non-injected, sclera clear OROPHARYNX:no exudate, no erythema and lips, buccal mucosa, and tongue normal  NECK: supple, thyroid normal size, non-tender, without nodularity LYMPH:  no palpable lymphadenopathy in the cervical, axillary or inguinal LUNGS: clear to auscultation and percussion with normal breathing effort HEART: regular rate & rhythm and no murmurs and no lower extremity edema ABDOMEN:abdomen soft, non-tender and normal bowel sounds Musculoskeletal:no cyanosis of digits and no clubbing  NEURO: alert & oriented x 3 with fluent speech, no focal motor/sensory deficits  LABORATORY DATA:  I have reviewed the data as listed    Component Value Date/Time   NA 135 (L) 08/21/2016 0948   K 3.8 08/21/2016 0948   CL 108 04/10/2015 0405   CL 107 06/29/2012 1131   CO2 22 08/21/2016 0948   GLUCOSE 136 08/21/2016 0948   GLUCOSE 143 (H) 06/29/2012 1131   BUN 27.1 (H) 08/21/2016 0948   CREATININE 1.7 (H) 08/21/2016 0948   CALCIUM 9.5 08/21/2016 0948   PROT 6.6 08/21/2016 0948   ALBUMIN 3.0 (L) 08/21/2016 0948   AST 15 08/21/2016 0948   ALT 25 08/21/2016  0948   ALKPHOS 55 08/21/2016 0948   BILITOT 0.35 08/21/2016 0948   GFRNONAA 38 (L) 04/10/2015 0405   GFRAA 44 (L) 04/10/2015 0405    No results found for: SPEP, UPEP  Lab Results  Component Value Date   WBC 10.8 (H) 08/21/2016   NEUTROABS 8.2 (H) 08/21/2016   HGB 8.3 (L) 08/21/2016   HCT 25.2 (L) 08/21/2016   MCV 95.1 08/21/2016   PLT 150 08/21/2016      Chemistry      Component Value Date/Time   NA 135 (L) 08/21/2016 0948   K 3.8 08/21/2016 0948   CL 108 04/10/2015 0405   CL 107 06/29/2012 1131   CO2 22 08/21/2016 0948   BUN 27.1 (H) 08/21/2016 0948   CREATININE 1.7 (H) 08/21/2016 0948      Component Value Date/Time   CALCIUM 9.5 08/21/2016 0948   ALKPHOS 55 08/21/2016 0948   AST  15 08/21/2016 0948   ALT 25 08/21/2016 0948   BILITOT 0.35 08/21/2016 0948     ASSESSMENT & PLAN:  Multiple myeloma in relapse [facility]) We reviewed his recent blood work Given progression of disease, he is starting on Pomalys on 08/14/2016. He has significant pancytopenia due to treatment We will see him back in 2  weeks for assessment of toxicity He will continue Daratumumab as scheduled  Deficiency anemia He has multifactorial anemia, component of iron deficiency anemia, anemia of chronic renal failure and multiple myeloma I recommend iron infusion next week and will recheck his blood work again at the end of the month We can consider starting him on ESA after adequate iron replacement therapy  Chronic renal insufficiency, stage III (moderate) This is chronic in nature. It is stable. Recommend observation only. We'll give him a reduced dose Zometa twice a year due to chronic renal failure He does not require dose adjustment for his chemotherapy.    Orders Placed This Encounter  Procedures  . Hold Tube, Blood Bank    Standing Status:   Standing    Number of Occurrences:   2    Standing Expiration Date:   08/21/2017   All questions were answered. The patient knows to call the clinic  with any problems, questions or concerns. No barriers to learning was detected. I spent 15 minutes counseling the patient face to face. The total time spent in the appointment was 20 minutes and more than 50% was on counseling and review of test results     Heath Lark, MD 08/22/2016 1:24 PM

## 2016-08-22 NOTE — Assessment & Plan Note (Signed)
We reviewed his recent blood work Given progression of disease, he is starting on Pomalys on 08/14/2016. He has significant pancytopenia due to treatment We will see him back in 2  weeks for assessment of toxicity He will continue Daratumumab as scheduled

## 2016-08-22 NOTE — Assessment & Plan Note (Signed)
He has multifactorial anemia, component of iron deficiency anemia, anemia of chronic renal failure and multiple myeloma I recommend iron infusion next week and will recheck his blood work again at the end of the month We can consider starting him on ESA after adequate iron replacement therapy

## 2016-08-22 NOTE — Assessment & Plan Note (Signed)
This is chronic in nature. It is stable. Recommend observation only. We'll give him a reduced dose Zometa twice a year due to chronic renal failure He does not require dose adjustment for his chemotherapy.  

## 2016-08-23 LAB — MULTIPLE MYELOMA PANEL, SERUM
ALBUMIN SERPL ELPH-MCNC: 3 g/dL (ref 2.9–4.4)
ALPHA 1: 0.3 g/dL (ref 0.0–0.4)
Albumin/Glob SerPl: 1 (ref 0.7–1.7)
Alpha2 Glob SerPl Elph-Mcnc: 0.7 g/dL (ref 0.4–1.0)
B-Globulin SerPl Elph-Mcnc: 1 g/dL (ref 0.7–1.3)
GAMMA GLOB SERPL ELPH-MCNC: 1.2 g/dL (ref 0.4–1.8)
GLOBULIN, TOTAL: 3.2 g/dL (ref 2.2–3.9)
IgG, Qn, Serum: 1411 mg/dL (ref 700–1600)
IgM, Qn, Serum: 6 mg/dL — ABNORMAL LOW (ref 15–143)
M Protein SerPl Elph-Mcnc: 1 g/dL — ABNORMAL HIGH
TOTAL PROTEIN: 6.2 g/dL (ref 6.0–8.5)

## 2016-08-27 DIAGNOSIS — C9001 Multiple myeloma in remission: Secondary | ICD-10-CM | POA: Diagnosis not present

## 2016-08-27 DIAGNOSIS — I10 Essential (primary) hypertension: Secondary | ICD-10-CM | POA: Diagnosis not present

## 2016-08-30 ENCOUNTER — Ambulatory Visit (HOSPITAL_BASED_OUTPATIENT_CLINIC_OR_DEPARTMENT_OTHER): Payer: Medicare Other

## 2016-08-30 VITALS — BP 125/67 | HR 78 | Temp 98.7°F | Resp 17

## 2016-08-30 DIAGNOSIS — D509 Iron deficiency anemia, unspecified: Secondary | ICD-10-CM

## 2016-08-30 DIAGNOSIS — N183 Chronic kidney disease, stage 3 unspecified: Secondary | ICD-10-CM

## 2016-08-30 DIAGNOSIS — C9002 Multiple myeloma in relapse: Secondary | ICD-10-CM

## 2016-08-30 DIAGNOSIS — Z95828 Presence of other vascular implants and grafts: Secondary | ICD-10-CM

## 2016-08-30 MED ORDER — SODIUM CHLORIDE 0.9% FLUSH
10.0000 mL | INTRAVENOUS | Status: DC | PRN
  Administered 2016-08-30: 10 mL
  Filled 2016-08-30: qty 10

## 2016-08-30 MED ORDER — SODIUM CHLORIDE 0.9% FLUSH
10.0000 mL | INTRAVENOUS | Status: DC | PRN
  Filled 2016-08-30: qty 10

## 2016-08-30 MED ORDER — SODIUM CHLORIDE 0.9% FLUSH
3.0000 mL | Freq: Once | INTRAVENOUS | Status: DC | PRN
  Filled 2016-08-30: qty 10

## 2016-08-30 MED ORDER — ALTEPLASE 2 MG IJ SOLR
2.0000 mg | Freq: Once | INTRAMUSCULAR | Status: DC | PRN
  Filled 2016-08-30: qty 2

## 2016-08-30 MED ORDER — SODIUM CHLORIDE 0.9 % IV SOLN
Freq: Once | INTRAVENOUS | Status: AC
  Administered 2016-08-30: 10:00:00 via INTRAVENOUS

## 2016-08-30 MED ORDER — SODIUM CHLORIDE 0.9 % IV SOLN
510.0000 mg | Freq: Once | INTRAVENOUS | Status: AC
  Administered 2016-08-30: 510 mg via INTRAVENOUS
  Filled 2016-08-30: qty 17

## 2016-08-30 MED ORDER — HEPARIN SOD (PORK) LOCK FLUSH 100 UNIT/ML IV SOLN
250.0000 [IU] | Freq: Once | INTRAVENOUS | Status: DC | PRN
  Filled 2016-08-30: qty 5

## 2016-08-30 MED ORDER — HEPARIN SOD (PORK) LOCK FLUSH 100 UNIT/ML IV SOLN
500.0000 [IU] | Freq: Once | INTRAVENOUS | Status: AC | PRN
  Administered 2016-08-30: 500 [IU]
  Filled 2016-08-30: qty 5

## 2016-08-30 NOTE — Patient Instructions (Signed)

## 2016-09-04 ENCOUNTER — Other Ambulatory Visit (HOSPITAL_BASED_OUTPATIENT_CLINIC_OR_DEPARTMENT_OTHER): Payer: Medicare Other

## 2016-09-04 ENCOUNTER — Ambulatory Visit (HOSPITAL_BASED_OUTPATIENT_CLINIC_OR_DEPARTMENT_OTHER): Payer: Medicare Other

## 2016-09-04 ENCOUNTER — Other Ambulatory Visit: Payer: Self-pay | Admitting: *Deleted

## 2016-09-04 ENCOUNTER — Telehealth: Payer: Self-pay | Admitting: Hematology and Oncology

## 2016-09-04 ENCOUNTER — Ambulatory Visit: Payer: Medicare Other

## 2016-09-04 ENCOUNTER — Ambulatory Visit (HOSPITAL_BASED_OUTPATIENT_CLINIC_OR_DEPARTMENT_OTHER): Payer: Medicare Other | Admitting: Hematology and Oncology

## 2016-09-04 VITALS — BP 114/59 | HR 57 | Temp 97.7°F | Resp 18

## 2016-09-04 DIAGNOSIS — N183 Chronic kidney disease, stage 3 unspecified: Secondary | ICD-10-CM

## 2016-09-04 DIAGNOSIS — Z5112 Encounter for antineoplastic immunotherapy: Secondary | ICD-10-CM | POA: Diagnosis not present

## 2016-09-04 DIAGNOSIS — Z95828 Presence of other vascular implants and grafts: Secondary | ICD-10-CM

## 2016-09-04 DIAGNOSIS — D61818 Other pancytopenia: Secondary | ICD-10-CM

## 2016-09-04 DIAGNOSIS — C9002 Multiple myeloma in relapse: Secondary | ICD-10-CM

## 2016-09-04 DIAGNOSIS — C9001 Multiple myeloma in remission: Secondary | ICD-10-CM

## 2016-09-04 DIAGNOSIS — D539 Nutritional anemia, unspecified: Secondary | ICD-10-CM

## 2016-09-04 LAB — CBC WITH DIFFERENTIAL/PLATELET
BASO%: 0.2 % (ref 0.0–2.0)
BASOS ABS: 0 10*3/uL (ref 0.0–0.1)
EOS ABS: 0.1 10*3/uL (ref 0.0–0.5)
EOS%: 1.4 % (ref 0.0–7.0)
HCT: 25.6 % — ABNORMAL LOW (ref 38.4–49.9)
HGB: 8.4 g/dL — ABNORMAL LOW (ref 13.0–17.1)
LYMPH%: 42.6 % (ref 14.0–49.0)
MCH: 30.9 pg (ref 27.2–33.4)
MCHC: 32.8 g/dL (ref 32.0–36.0)
MCV: 94.1 fL (ref 79.3–98.0)
MONO#: 0.4 10*3/uL (ref 0.1–0.9)
MONO%: 10.1 % (ref 0.0–14.0)
NEUT#: 2 10*3/uL (ref 1.5–6.5)
NEUT%: 45.7 % (ref 39.0–75.0)
Platelets: 114 10*3/uL — ABNORMAL LOW (ref 140–400)
RBC: 2.72 10*6/uL — AB (ref 4.20–5.82)
RDW: 16.4 % — ABNORMAL HIGH (ref 11.0–14.6)
WBC: 4.4 10*3/uL (ref 4.0–10.3)
lymph#: 1.9 10*3/uL (ref 0.9–3.3)

## 2016-09-04 LAB — COMPREHENSIVE METABOLIC PANEL
ALK PHOS: 63 U/L (ref 40–150)
ALT: 18 U/L (ref 0–55)
AST: 17 U/L (ref 5–34)
Albumin: 2.8 g/dL — ABNORMAL LOW (ref 3.5–5.0)
Anion Gap: 8 mEq/L (ref 3–11)
BUN: 14.8 mg/dL (ref 7.0–26.0)
CO2: 21 meq/L — AB (ref 22–29)
Calcium: 8.3 mg/dL — ABNORMAL LOW (ref 8.4–10.4)
Chloride: 106 mEq/L (ref 98–109)
Creatinine: 1.7 mg/dL — ABNORMAL HIGH (ref 0.7–1.3)
EGFR: 46 mL/min/{1.73_m2} — AB (ref >90)
GLUCOSE: 117 mg/dL (ref 70–140)
POTASSIUM: 3.5 meq/L (ref 3.5–5.1)
SODIUM: 135 meq/L — AB (ref 136–145)
Total Bilirubin: 0.5 mg/dL (ref 0.20–1.20)
Total Protein: 6.3 g/dL — ABNORMAL LOW (ref 6.4–8.3)

## 2016-09-04 MED ORDER — ACETAMINOPHEN 325 MG PO TABS
ORAL_TABLET | ORAL | Status: AC
  Filled 2016-09-04: qty 2

## 2016-09-04 MED ORDER — POMALIDOMIDE 2 MG PO CAPS: 2.0000 mg | ORAL_CAPSULE | Freq: Every day | ORAL | 11 refills | Status: DC

## 2016-09-04 MED ORDER — METHYLPREDNISOLONE SODIUM SUCC 40 MG IJ SOLR
INTRAMUSCULAR | Status: AC
  Filled 2016-09-04: qty 1

## 2016-09-04 MED ORDER — SODIUM CHLORIDE 0.9 % IV SOLN
510.0000 mg | Freq: Once | INTRAVENOUS | Status: AC
  Administered 2016-09-04: 510 mg via INTRAVENOUS
  Filled 2016-09-04: qty 17

## 2016-09-04 MED ORDER — PROCHLORPERAZINE MALEATE 10 MG PO TABS
ORAL_TABLET | ORAL | Status: AC
  Filled 2016-09-04: qty 1

## 2016-09-04 MED ORDER — SODIUM CHLORIDE 0.9 % IV SOLN
Freq: Once | INTRAVENOUS | Status: AC
  Administered 2016-09-04: 09:00:00 via INTRAVENOUS

## 2016-09-04 MED ORDER — DIPHENHYDRAMINE HCL 25 MG PO CAPS
ORAL_CAPSULE | ORAL | Status: AC
  Filled 2016-09-04: qty 2

## 2016-09-04 MED ORDER — SODIUM CHLORIDE 0.9 % IV SOLN
15.2000 mg/kg | Freq: Once | INTRAVENOUS | Status: AC
  Administered 2016-09-04: 1200 mg via INTRAVENOUS
  Filled 2016-09-04: qty 60

## 2016-09-04 MED ORDER — SODIUM CHLORIDE 0.9% FLUSH
10.0000 mL | Freq: Once | INTRAVENOUS | Status: AC
  Administered 2016-09-04: 10 mL
  Filled 2016-09-04: qty 10

## 2016-09-04 MED ORDER — DIPHENHYDRAMINE HCL 25 MG PO CAPS
50.0000 mg | ORAL_CAPSULE | Freq: Once | ORAL | Status: AC
  Administered 2016-09-04: 50 mg via ORAL

## 2016-09-04 MED ORDER — SODIUM CHLORIDE 0.9% FLUSH
10.0000 mL | INTRAVENOUS | Status: DC | PRN
Start: 2016-09-04 — End: 2016-09-04
  Administered 2016-09-04: 10 mL
  Filled 2016-09-04: qty 10

## 2016-09-04 MED ORDER — PROCHLORPERAZINE MALEATE 10 MG PO TABS
10.0000 mg | ORAL_TABLET | Freq: Once | ORAL | Status: AC
  Administered 2016-09-04: 10 mg via ORAL

## 2016-09-04 MED ORDER — ACETAMINOPHEN 325 MG PO TABS
650.0000 mg | ORAL_TABLET | Freq: Once | ORAL | Status: AC
  Administered 2016-09-04: 650 mg via ORAL

## 2016-09-04 MED ORDER — METHYLPREDNISOLONE SODIUM SUCC 40 MG IJ SOLR
40.0000 mg | Freq: Once | INTRAMUSCULAR | Status: AC
  Administered 2016-09-04: 40 mg via INTRAVENOUS

## 2016-09-04 MED ORDER — HEPARIN SOD (PORK) LOCK FLUSH 100 UNIT/ML IV SOLN
500.0000 [IU] | Freq: Once | INTRAVENOUS | Status: AC | PRN
  Administered 2016-09-04: 500 [IU]
  Filled 2016-09-04: qty 5

## 2016-09-04 NOTE — Telephone Encounter (Signed)
Gave patients wife AVS and calendar of upcoming September and October appointments.

## 2016-09-04 NOTE — Patient Instructions (Signed)
[facility] Discharge Instructions for Patients Receiving Chemotherapy  Today you received the following chemotherapy agents: Darzalex  To help prevent nausea and vomiting after your treatment, we encourage you to take your nausea medication as directed.    If you develop nausea and vomiting that is not controlled by your nausea medication, call the clinic.   BELOW ARE SYMPTOMS THAT SHOULD BE REPORTED IMMEDIATELY:  *FEVER GREATER THAN 100.5 F  *CHILLS WITH OR WITHOUT FEVER  NAUSEA AND VOMITING THAT IS NOT CONTROLLED WITH YOUR NAUSEA MEDICATION  *UNUSUAL SHORTNESS OF BREATH  *UNUSUAL BRUISING OR BLEEDING  TENDERNESS IN MOUTH AND THROAT WITH OR WITHOUT PRESENCE OF ULCERS  *URINARY PROBLEMS  *BOWEL PROBLEMS  UNUSUAL RASH Items with * indicate a potential emergency and should be followed up as soon as possible.  Feel free to call the clinic you have any questions or concerns. The clinic phone number is [phone].  Please show the [place] at check-in to the Emergency Department and triage nurse.  Ferumoxytol injection What is this medicine? FERUMOXYTOL is an iron complex. Iron is used to make healthy red blood cells, which carry oxygen and nutrients throughout the body. This medicine is used to treat iron deficiency anemia in people with chronic kidney disease. This medicine may be used for other purposes; ask your health care provider or pharmacist if you have questions. COMMON BRAND NAME(S): Feraheme What should I tell my health care provider before I take this medicine? They need to know if you have any of these conditions: -anemia not caused by low iron levels -high levels of iron in the blood -magnetic resonance imaging (MRI) test scheduled -an unusual or allergic reaction to iron, other medicines, foods, dyes, or preservatives -pregnant or trying to get pregnant -breast-feeding How should I use this medicine? This medicine is for injection  into a vein. It is given by a health care professional in a hospital or clinic setting. Talk to your pediatrician regarding the use of this medicine in children. Special care may be needed. Overdosage: If you think you have taken too much of this medicine contact a poison control center or emergency room at once. NOTE: This medicine is only for you. Do not share this medicine with others. What if I miss a dose? It is important not to miss your dose. Call your doctor or health care professional if you are unable to keep an appointment. What may interact with this medicine? This medicine may interact with the following medications: -other iron products This list may not describe all possible interactions. Give your health care provider a list of all the medicines, herbs, non-prescription drugs, or dietary supplements you use. Also tell them if you smoke, drink alcohol, or use illegal drugs. Some items may interact with your medicine. What should I watch for while using this medicine? Visit your doctor or healthcare professional regularly. Tell your doctor or healthcare professional if your symptoms do not start to get better or if they get worse. You may need blood work done while you are taking this medicine. You may need to follow a special diet. Talk to your doctor. Foods that contain iron include: whole grains/cereals, dried fruits, beans, or peas, leafy green vegetables, and organ meats (liver, kidney). What side effects may I notice from receiving this medicine? Side effects that you should report to your doctor or health care professional as soon as possible: -allergic reactions like skin rash, itching or hives, swelling of the face,  lips, or tongue -breathing problems -changes in blood pressure -feeling faint or lightheaded, falls -fever or chills -flushing, sweating, or hot feelings -swelling of the ankles or feet Side effects that usually do not require medical attention (report to your  doctor or health care professional if they continue or are bothersome): -diarrhea -headache -nausea, vomiting -stomach pain This list may not describe all possible side effects. Call your doctor for medical advice about side effects. You may report side effects to FDA at 1-800-FDA-1088. Where should I keep my medicine? This drug is given in a hospital or clinic and will not be stored at home. NOTE: This sheet is a summary. It may not cover all possible information. If you have questions about this medicine, talk to your doctor, pharmacist, or health care provider.  2018 Elsevier/Gold Standard (2015-01-26 12:41:49)

## 2016-09-05 ENCOUNTER — Encounter: Payer: Self-pay | Admitting: Hematology and Oncology

## 2016-09-05 ENCOUNTER — Telehealth: Payer: Self-pay

## 2016-09-05 LAB — KAPPA/LAMBDA LIGHT CHAINS
IG KAPPA FREE LIGHT CHAIN: 94.5 mg/L — AB (ref 3.3–19.4)
IG LAMBDA FREE LIGHT CHAIN: 10 mg/L (ref 5.7–26.3)
KAPPA/LAMBDA FLC RATIO: 9.45 — AB (ref 0.26–1.65)

## 2016-09-05 NOTE — Assessment & Plan Note (Signed)
This is chronic in nature. It is stable. Recommend observation only. We'll give him a reduced dose Zometa twice a year due to chronic renal failure He does not require dose adjustment for his chemotherapy.

## 2016-09-05 NOTE — Progress Notes (Signed)
[place] OFFICE PROGRESS NOTE  Patient Care Team: Lucianne Lei, MD as PCP - General (Family Medicine) Heath Lark, MD as Consulting Physician (Hematology and Oncology) Pleasant, [name], RN as [facility]  SUMMARY OF ONCOLOGIC HISTORY:   Multiple myeloma in relapse [facility])   12/12/2010 Initial Diagnosis    Multiple myeloma      02/16/2014 Imaging    Skeletal survey showed diffuse osteopenia      03/02/2014 Bone Marrow Biopsy    Accession: [id number] BM biopsy showed only 5 % plasma cells. However, the biopsy was difficult and the bone was fragmented during the procedure. Cytogenetics and FISH study is normal      03/03/2014 Procedure    he has port placement      03/10/2014 - 05/19/2014 Chemotherapy    he received elotuzumab and revlimid      05/20/2014 - 02/25/2016 Chemotherapy    Treatment is switched to maintenance Revlimid only. Treatment is stopped due to progressive disease      11/15/2014 Adverse Reaction    He has mild worsening anemia. Does of Revlimid reduced to 5 mg 21 days on, 7 days off      03/06/2016 -  Chemotherapy    He received Daratumumab and Velcade. Velcade is stopped on 07/24/16      08/14/2016 Miscellaneous    Pomalyst is added along with Daratumumab       INTERVAL HISTORY: Please see below for problem oriented charting. He is seen for further follow-up He is doing well on treatment Denies recent infection No new bone pain The patient denies any recent signs or symptoms of bleeding such as spontaneous epistaxis, hematuria or hematochezia.  REVIEW OF SYSTEMS:   Constitutional: Denies fevers, chills or abnormal weight loss Eyes: Denies blurriness of vision Ears, nose, mouth, throat, and face: Denies mucositis or sore throat Respiratory: Denies cough, dyspnea or wheezes Cardiovascular: Denies palpitation, chest discomfort or lower extremity swelling Gastrointestinal:  Denies nausea, heartburn or change  in bowel habits Skin: Denies abnormal skin rashes Lymphatics: Denies new lymphadenopathy or easy bruising Neurological:Denies numbness, tingling or new weaknesses Behavioral/Psych: Mood is stable, no new changes  All other systems were reviewed with the patient and are negative.  I have reviewed the past medical history, past surgical history, social history and family history with the patient and they are unchanged from previous note.  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  MEDICATIONS:  Current Outpatient Prescriptions  Medication Sig Dispense Refill  . acyclovir (ZOVIRAX) 400 MG tablet Take 1 tablet (400 mg total) by mouth daily. 60 tablet 11  . allopurinol (ZYLOPRIM) 100 MG tablet Take 100 mg by mouth 2 (two) times daily.     Marland Kitchen aspirin EC 81 MG tablet Take 81 mg by mouth daily.    Marland Kitchen atorvastatin (LIPITOR) 20 MG tablet Take 20 mg by mouth at bedtime.     . brimonidine (ALPHAGAN) 0.15 % ophthalmic solution Place 1 drop into the right eye 3 (three) times daily.    . cholecalciferol (VITAMIN D) 1000 UNITS tablet Take 1,000 Units by mouth daily.    Marland Kitchen dexamethasone (DECADRON) 4 MG tablet Take 5 tablets every Mondays 20 tablet 4  . dorzolamide-timolol (COSOPT) 22.3-6.8 MG/ML ophthalmic solution Place 1 drop into both eyes 2 (two) times daily.     . fluorometholone (FML) 0.1 % ophthalmic suspension Place 1 drop into both eyes daily.    Marland Kitchen latanoprost (XALATAN) 0.005 % ophthalmic solution Place  1 drop into the right eye at bedtime.    . lidocaine-prilocaine (EMLA) cream Apply 1 application topically as needed. 30 g 1  . lidocaine-prilocaine (EMLA) cream Apply to affected area once 30 g 3  . loperamide (IMODIUM A-D) 2 MG tablet 1 po after each watery BM.  Maximum 6 per 24hrs. 30 tablet PRN  . ondansetron (ZOFRAN) 8 MG tablet Take 1 tablet (8 mg total) by mouth 2 (two) times daily as needed (Nausea or vomiting). 30 tablet 1  . pomalidomide (POMALYST) 2 MG capsule Take 1 capsule (2  mg total) by mouth daily. Take with water on days 1-21. Repeat every 28 days. 21 capsule 11  . prochlorperazine (COMPAZINE) 10 MG tablet Take 1 tablet (10 mg total) by mouth every 6 (six) hours as needed (Nausea or vomiting). 30 tablet 1   No current facility-administered medications for this visit.     PHYSICAL EXAMINATION: ECOG PERFORMANCE STATUS: 1 - Symptomatic but completely ambulatory GENERAL:alert, no distress and comfortable SKIN: skin color, texture, turgor are normal, no rashes or significant lesions EYES: normal, Conjunctiva are pink and non-injected, sclera clear OROPHARYNX:no exudate, no erythema and lips, buccal mucosa, and tongue normal  NECK: supple, thyroid normal size, non-tender, without nodularity LYMPH:  no palpable lymphadenopathy in the cervical, axillary or inguinal LUNGS: clear to auscultation and percussion with normal breathing effort HEART: regular rate & rhythm and no murmurs and no lower extremity edema ABDOMEN:abdomen soft, non-tender and normal bowel sounds Musculoskeletal:no cyanosis of digits and no clubbing  NEURO: alert & oriented x 3 with fluent speech, no focal motor/sensory deficits  LABORATORY DATA:  I have reviewed the data as listed    Component Value Date/Time   NA 135 (L) 09/04/2016 0746   K 3.5 09/04/2016 0746   CL 108 04/10/2015 0405   CL 107 06/29/2012 1131   CO2 21 (L) 09/04/2016 0746   GLUCOSE 117 09/04/2016 0746   GLUCOSE 143 (H) 06/29/2012 1131   BUN 14.8 09/04/2016 0746   CREATININE 1.7 (H) 09/04/2016 0746   CALCIUM 8.3 (L) 09/04/2016 0746   PROT 6.3 (L) 09/04/2016 0746   ALBUMIN 2.8 (L) 09/04/2016 0746   AST 17 09/04/2016 0746   ALT 18 09/04/2016 0746   ALKPHOS 63 09/04/2016 0746   BILITOT 0.50 09/04/2016 0746   GFRNONAA 38 (L) 04/10/2015 0405   GFRAA 44 (L) 04/10/2015 0405    No results found for: SPEP, UPEP  Lab Results  Component Value Date   WBC 4.4 09/04/2016   NEUTROABS 2.0 09/04/2016   HGB 8.4 (L) 09/04/2016    HCT 25.6 (L) 09/04/2016   MCV 94.1 09/04/2016   PLT 114 (L) 09/04/2016      Chemistry      Component Value Date/Time   NA 135 (L) 09/04/2016 0746   K 3.5 09/04/2016 0746   CL 108 04/10/2015 0405   CL 107 06/29/2012 1131   CO2 21 (L) 09/04/2016 0746   BUN 14.8 09/04/2016 0746   CREATININE 1.7 (H) 09/04/2016 0746      Component Value Date/Time   CALCIUM 8.3 (L) 09/04/2016 0746   ALKPHOS 63 09/04/2016 0746   AST 17 09/04/2016 0746   ALT 18 09/04/2016 0746   BILITOT 0.50 09/04/2016 0746       ASSESSMENT & PLAN:  Multiple myeloma in relapse ([place]) We reviewed his recent blood work Given progression of disease, he is starting on Pomalyst on 08/14/2016. He has significant pancytopenia due to treatment, resolved Clinically, he appears  to be responding very well to treatment  He will continue treatment with Pomalyst 3 weeks on 1 week off. He will continue Daratumumab as scheduled  Pancytopenia, acquired ([place]) He is not symptomatic from pancytopenia I will continue to treatment without dose adjustment He does not need blood transfusion  Chronic renal insufficiency, stage III (moderate) This is chronic in nature. It is stable. Recommend observation only. We'll give him a reduced dose Zometa twice a year due to chronic renal failure He does not require dose adjustment for his chemotherapy.    No orders of the defined types were placed in this encounter.  All questions were answered. The patient knows to call the clinic with any problems, questions or concerns. No barriers to learning was detected. I spent 15 minutes counseling the patient face to face. The total time spent in the appointment was 20 minutes and more than 50% was on counseling and review of test results     Heath Lark, MD 09/05/2016 3:24 PM

## 2016-09-05 NOTE — Assessment & Plan Note (Signed)
He is not symptomatic from pancytopenia I will continue to treatment without dose adjustment He does not need blood transfusion

## 2016-09-05 NOTE — Assessment & Plan Note (Signed)
We reviewed his recent blood work Given progression of disease, he is starting on Pomalyst on 08/14/2016. He has significant pancytopenia due to treatment, resolved Clinically, he appears to be responding very well to treatment  He will continue treatment with Pomalyst 3 weeks on 1 week off. He will continue Daratumumab as scheduled

## 2016-09-05 NOTE — Telephone Encounter (Signed)
natasha from [employer] called that pt answered "no" on the question is he aware that pomalyst can cause birth defects. Instructed natasha it was Ok to remove the restriction on his chart and deliver the pomalyst.

## 2016-09-06 LAB — MULTIPLE MYELOMA PANEL, SERUM
ALBUMIN/GLOB SERPL: 1.1 (ref 0.7–1.7)
ALPHA2 GLOB SERPL ELPH-MCNC: 0.7 g/dL (ref 0.4–1.0)
Albumin SerPl Elph-Mcnc: 3 g/dL (ref 2.9–4.4)
Alpha 1: 0.3 g/dL (ref 0.0–0.4)
B-Globulin SerPl Elph-Mcnc: 0.9 g/dL (ref 0.7–1.3)
GAMMA GLOB SERPL ELPH-MCNC: 0.9 g/dL (ref 0.4–1.8)
GLOBULIN, TOTAL: 2.8 g/dL (ref 2.2–3.9)
IGM (IMMUNOGLOBIN M), SRM: 7 mg/dL — AB (ref 15–143)
IgA, Qn, Serum: 21 mg/dL — ABNORMAL LOW (ref 61–437)
M Protein SerPl Elph-Mcnc: 0.7 g/dL — ABNORMAL HIGH
Total Protein: 5.8 g/dL — ABNORMAL LOW (ref 6.0–8.5)

## 2016-09-25 ENCOUNTER — Other Ambulatory Visit (HOSPITAL_BASED_OUTPATIENT_CLINIC_OR_DEPARTMENT_OTHER): Payer: Medicare Other

## 2016-09-25 ENCOUNTER — Ambulatory Visit: Payer: Medicare Other

## 2016-09-25 ENCOUNTER — Ambulatory Visit (HOSPITAL_BASED_OUTPATIENT_CLINIC_OR_DEPARTMENT_OTHER): Payer: Medicare Other

## 2016-09-25 VITALS — BP 134/65 | HR 65 | Temp 97.7°F | Resp 18

## 2016-09-25 DIAGNOSIS — C9002 Multiple myeloma in relapse: Secondary | ICD-10-CM

## 2016-09-25 DIAGNOSIS — Z5112 Encounter for antineoplastic immunotherapy: Secondary | ICD-10-CM

## 2016-09-25 DIAGNOSIS — C9001 Multiple myeloma in remission: Secondary | ICD-10-CM

## 2016-09-25 DIAGNOSIS — D539 Nutritional anemia, unspecified: Secondary | ICD-10-CM

## 2016-09-25 DIAGNOSIS — Z95828 Presence of other vascular implants and grafts: Secondary | ICD-10-CM

## 2016-09-25 DIAGNOSIS — N183 Chronic kidney disease, stage 3 unspecified: Secondary | ICD-10-CM

## 2016-09-25 LAB — CBC WITH DIFFERENTIAL/PLATELET
BASO%: 0 % (ref 0.0–2.0)
Basophils Absolute: 0 10*3/uL (ref 0.0–0.1)
EOS ABS: 0 10*3/uL (ref 0.0–0.5)
EOS%: 0.1 % (ref 0.0–7.0)
HCT: 26.9 % — ABNORMAL LOW (ref 38.4–49.9)
HGB: 9.1 g/dL — ABNORMAL LOW (ref 13.0–17.1)
LYMPH%: 21.9 % (ref 14.0–49.0)
MCH: 32.3 pg (ref 27.2–33.4)
MCHC: 33.8 g/dL (ref 32.0–36.0)
MCV: 95.4 fL (ref 79.3–98.0)
MONO#: 0.9 10*3/uL (ref 0.1–0.9)
MONO%: 10.9 % (ref 0.0–14.0)
NEUT%: 67.1 % (ref 39.0–75.0)
NEUTROS ABS: 5.4 10*3/uL (ref 1.5–6.5)
Platelets: 198 10*3/uL (ref 140–400)
RBC: 2.82 10*6/uL — ABNORMAL LOW (ref 4.20–5.82)
RDW: 17.2 % — AB (ref 11.0–14.6)
WBC: 8.1 10*3/uL (ref 4.0–10.3)
lymph#: 1.8 10*3/uL (ref 0.9–3.3)

## 2016-09-25 LAB — COMPREHENSIVE METABOLIC PANEL
ALBUMIN: 3.1 g/dL — AB (ref 3.5–5.0)
ALK PHOS: 59 U/L (ref 40–150)
ALT: 15 U/L (ref 0–55)
AST: 12 U/L (ref 5–34)
Anion Gap: 11 mEq/L (ref 3–11)
BILIRUBIN TOTAL: 0.31 mg/dL (ref 0.20–1.20)
BUN: 29.7 mg/dL — AB (ref 7.0–26.0)
CO2: 20 mEq/L — ABNORMAL LOW (ref 22–29)
CREATININE: 1.8 mg/dL — AB (ref 0.7–1.3)
Calcium: 9 mg/dL (ref 8.4–10.4)
Chloride: 105 mEq/L (ref 98–109)
EGFR: 43 mL/min/{1.73_m2} — ABNORMAL LOW (ref >90)
GLUCOSE: 177 mg/dL — AB (ref 70–140)
Potassium: 3.7 mEq/L (ref 3.5–5.1)
SODIUM: 135 meq/L — AB (ref 136–145)
TOTAL PROTEIN: 6.4 g/dL (ref 6.4–8.3)

## 2016-09-25 MED ORDER — HEPARIN SOD (PORK) LOCK FLUSH 100 UNIT/ML IV SOLN
500.0000 [IU] | Freq: Once | INTRAVENOUS | Status: AC | PRN
  Administered 2016-09-25: 500 [IU]
  Filled 2016-09-25: qty 5

## 2016-09-25 MED ORDER — PROCHLORPERAZINE MALEATE 10 MG PO TABS
10.0000 mg | ORAL_TABLET | Freq: Once | ORAL | Status: AC
  Administered 2016-09-25: 10 mg via ORAL

## 2016-09-25 MED ORDER — DARATUMUMAB CHEMO INJECTION 400 MG/20ML
15.2000 mg/kg | Freq: Once | INTRAVENOUS | Status: AC
  Administered 2016-09-25: 1200 mg via INTRAVENOUS
  Filled 2016-09-25: qty 60

## 2016-09-25 MED ORDER — DIPHENHYDRAMINE HCL 25 MG PO CAPS
ORAL_CAPSULE | ORAL | Status: AC
  Filled 2016-09-25: qty 2

## 2016-09-25 MED ORDER — SODIUM CHLORIDE 0.9% FLUSH
10.0000 mL | Freq: Once | INTRAVENOUS | Status: AC
  Administered 2016-09-25: 10 mL
  Filled 2016-09-25: qty 10

## 2016-09-25 MED ORDER — ACETAMINOPHEN 325 MG PO TABS
650.0000 mg | ORAL_TABLET | Freq: Once | ORAL | Status: AC
  Administered 2016-09-25: 650 mg via ORAL

## 2016-09-25 MED ORDER — ACETAMINOPHEN 325 MG PO TABS
ORAL_TABLET | ORAL | Status: AC
  Filled 2016-09-25: qty 2

## 2016-09-25 MED ORDER — SODIUM CHLORIDE 0.9% FLUSH
10.0000 mL | INTRAVENOUS | Status: DC | PRN
  Administered 2016-09-25: 10 mL
  Filled 2016-09-25: qty 10

## 2016-09-25 MED ORDER — SODIUM CHLORIDE 0.9 % IV SOLN
Freq: Once | INTRAVENOUS | Status: AC
  Administered 2016-09-25: 10:00:00 via INTRAVENOUS

## 2016-09-25 MED ORDER — PROCHLORPERAZINE MALEATE 10 MG PO TABS
ORAL_TABLET | ORAL | Status: AC
  Filled 2016-09-25: qty 1

## 2016-09-25 MED ORDER — SODIUM CHLORIDE 0.9 % IV SOLN: Freq: Once | INTRAVENOUS | Status: DC

## 2016-09-25 MED ORDER — METHYLPREDNISOLONE SODIUM SUCC 40 MG IJ SOLR
INTRAMUSCULAR | Status: AC
  Filled 2016-09-25: qty 1

## 2016-09-25 MED ORDER — METHYLPREDNISOLONE SODIUM SUCC 40 MG IJ SOLR
40.0000 mg | Freq: Once | INTRAMUSCULAR | Status: AC
  Administered 2016-09-25: 40 mg via INTRAVENOUS

## 2016-09-25 MED ORDER — DIPHENHYDRAMINE HCL 25 MG PO CAPS
50.0000 mg | ORAL_CAPSULE | Freq: Once | ORAL | Status: AC
  Administered 2016-09-25: 50 mg via ORAL

## 2016-09-25 NOTE — Patient Instructions (Signed)
[facility] Discharge Instructions for Patients Receiving Chemotherapy  Today you received the following chemotherapy agents Darzalex.  To help prevent nausea and vomiting after your treatment, we encourage you to take your nausea medication as directed.  If you develop nausea and vomiting that is not controlled by your nausea medication, call the clinic.   BELOW ARE SYMPTOMS THAT SHOULD BE REPORTED IMMEDIATELY:  *FEVER GREATER THAN 100.5 F  *CHILLS WITH OR WITHOUT FEVER  NAUSEA AND VOMITING THAT IS NOT CONTROLLED WITH YOUR NAUSEA MEDICATION  *UNUSUAL SHORTNESS OF BREATH  *UNUSUAL BRUISING OR BLEEDING  TENDERNESS IN MOUTH AND THROAT WITH OR WITHOUT PRESENCE OF ULCERS  *URINARY PROBLEMS  *BOWEL PROBLEMS  UNUSUAL RASH Items with * indicate a potential emergency and should be followed up as soon as possible.  Feel free to call the clinic you have any questions or concerns. The clinic phone number is [phone].  Please show the CHEMO ALERT CARD at check-in to the Emergency Department and triage nurse.    

## 2016-09-26 LAB — KAPPA/LAMBDA LIGHT CHAINS
IG KAPPA FREE LIGHT CHAIN: 42.2 mg/L — AB (ref 3.3–19.4)
Ig Lambda Free Light Chain: 7.4 mg/L (ref 5.7–26.3)
Kappa/Lambda FluidC Ratio: 5.7 — ABNORMAL HIGH (ref 0.26–1.65)

## 2016-09-30 LAB — MULTIPLE MYELOMA PANEL, SERUM
ALBUMIN/GLOB SERPL: 1.1 (ref 0.7–1.7)
ALPHA2 GLOB SERPL ELPH-MCNC: 0.7 g/dL (ref 0.4–1.0)
Albumin SerPl Elph-Mcnc: 3 g/dL (ref 2.9–4.4)
Alpha 1: 0.3 g/dL (ref 0.0–0.4)
B-Globulin SerPl Elph-Mcnc: 0.9 g/dL (ref 0.7–1.3)
GAMMA GLOB SERPL ELPH-MCNC: 0.9 g/dL (ref 0.4–1.8)
GLOBULIN, TOTAL: 2.9 g/dL (ref 2.2–3.9)
IGG (IMMUNOGLOBIN G), SERUM: 847 mg/dL (ref 700–1600)
IgA, Qn, Serum: 25 mg/dL — ABNORMAL LOW (ref 61–437)
IgM, Qn, Serum: 6 mg/dL — ABNORMAL LOW (ref 15–143)
M PROTEIN SERPL ELPH-MCNC: 0.7 g/dL — AB
Total Protein: 5.9 g/dL — ABNORMAL LOW (ref 6.0–8.5)

## 2016-10-07 ENCOUNTER — Other Ambulatory Visit: Payer: Self-pay

## 2016-10-07 ENCOUNTER — Telehealth: Payer: Self-pay

## 2016-10-07 DIAGNOSIS — C9002 Multiple myeloma in relapse: Secondary | ICD-10-CM

## 2016-10-07 MED ORDER — POMALIDOMIDE 2 MG PO CAPS: 2.0000 mg | ORAL_CAPSULE | Freq: Every day | ORAL | 11 refills | Status: DC

## 2016-10-08 NOTE — Telephone Encounter (Signed)
Pt requested a call back. Attempted x3 with no answer. LVM returning call.

## 2016-10-16 ENCOUNTER — Encounter (HOSPITAL_COMMUNITY): Payer: Self-pay

## 2016-10-16 ENCOUNTER — Observation Stay (HOSPITAL_COMMUNITY)
Admission: EM | Admit: 2016-10-16 | Discharge: 2016-10-18 | Disposition: A | Discharge status: Home / Self Care | Payer: Medicare Other | Attending: Internal Medicine | Admitting: Internal Medicine

## 2016-10-16 ENCOUNTER — Ambulatory Visit (HOSPITAL_BASED_OUTPATIENT_CLINIC_OR_DEPARTMENT_OTHER): Payer: Medicare Other

## 2016-10-16 ENCOUNTER — Emergency Department (HOSPITAL_COMMUNITY): Payer: Medicare Other

## 2016-10-16 ENCOUNTER — Ambulatory Visit (HOSPITAL_BASED_OUTPATIENT_CLINIC_OR_DEPARTMENT_OTHER): Payer: Medicare Other | Admitting: Hematology and Oncology

## 2016-10-16 ENCOUNTER — Ambulatory Visit: Payer: Medicare Other

## 2016-10-16 ENCOUNTER — Other Ambulatory Visit (HOSPITAL_BASED_OUTPATIENT_CLINIC_OR_DEPARTMENT_OTHER): Payer: Medicare Other

## 2016-10-16 ENCOUNTER — Encounter: Payer: Self-pay | Admitting: Hematology and Oncology

## 2016-10-16 ENCOUNTER — Telehealth: Payer: Self-pay | Admitting: Hematology and Oncology

## 2016-10-16 VITALS — BP 114/64 | HR 89 | Temp 98.2°F | Resp 18

## 2016-10-16 DIAGNOSIS — R918 Other nonspecific abnormal finding of lung field: Secondary | ICD-10-CM | POA: Diagnosis not present

## 2016-10-16 DIAGNOSIS — E46 Unspecified protein-calorie malnutrition: Secondary | ICD-10-CM | POA: Diagnosis not present

## 2016-10-16 DIAGNOSIS — D62 Acute posthemorrhagic anemia: Secondary | ICD-10-CM | POA: Diagnosis not present

## 2016-10-16 DIAGNOSIS — K2950 Unspecified chronic gastritis without bleeding: Secondary | ICD-10-CM | POA: Diagnosis not present

## 2016-10-16 DIAGNOSIS — K449 Diaphragmatic hernia without obstruction or gangrene: Secondary | ICD-10-CM | POA: Diagnosis not present

## 2016-10-16 DIAGNOSIS — Z7982 Long term (current) use of aspirin: Secondary | ICD-10-CM | POA: Diagnosis not present

## 2016-10-16 DIAGNOSIS — I129 Hypertensive chronic kidney disease with stage 1 through stage 4 chronic kidney disease, or unspecified chronic kidney disease: Secondary | ICD-10-CM | POA: Insufficient documentation

## 2016-10-16 DIAGNOSIS — K920 Hematemesis: Secondary | ICD-10-CM | POA: Diagnosis not present

## 2016-10-16 DIAGNOSIS — C9002 Multiple myeloma in relapse: Secondary | ICD-10-CM

## 2016-10-16 DIAGNOSIS — N179 Acute kidney failure, unspecified: Secondary | ICD-10-CM | POA: Diagnosis not present

## 2016-10-16 DIAGNOSIS — N183 Chronic kidney disease, stage 3 unspecified: Secondary | ICD-10-CM | POA: Diagnosis present

## 2016-10-16 DIAGNOSIS — R112 Nausea with vomiting, unspecified: Secondary | ICD-10-CM | POA: Diagnosis not present

## 2016-10-16 DIAGNOSIS — Z5112 Encounter for antineoplastic immunotherapy: Secondary | ICD-10-CM | POA: Diagnosis not present

## 2016-10-16 DIAGNOSIS — Z79899 Other long term (current) drug therapy: Secondary | ICD-10-CM | POA: Diagnosis not present

## 2016-10-16 DIAGNOSIS — K5710 Diverticulosis of small intestine without perforation or abscess without bleeding: Secondary | ICD-10-CM | POA: Insufficient documentation

## 2016-10-16 DIAGNOSIS — K922 Gastrointestinal hemorrhage, unspecified: Secondary | ICD-10-CM | POA: Diagnosis not present

## 2016-10-16 DIAGNOSIS — Z95828 Presence of other vascular implants and grafts: Secondary | ICD-10-CM

## 2016-10-16 DIAGNOSIS — D631 Anemia in chronic kidney disease: Secondary | ICD-10-CM

## 2016-10-16 DIAGNOSIS — E441 Mild protein-calorie malnutrition: Secondary | ICD-10-CM | POA: Diagnosis not present

## 2016-10-16 DIAGNOSIS — J690 Pneumonitis due to inhalation of food and vomit: Secondary | ICD-10-CM | POA: Insufficient documentation

## 2016-10-16 DIAGNOSIS — R531 Weakness: Secondary | ICD-10-CM | POA: Diagnosis not present

## 2016-10-16 DIAGNOSIS — I10 Essential (primary) hypertension: Secondary | ICD-10-CM | POA: Diagnosis not present

## 2016-10-16 DIAGNOSIS — K2210 Ulcer of esophagus without bleeding: Principal | ICD-10-CM | POA: Insufficient documentation

## 2016-10-16 DIAGNOSIS — C9001 Multiple myeloma in remission: Secondary | ICD-10-CM

## 2016-10-16 DIAGNOSIS — R55 Syncope and collapse: Secondary | ICD-10-CM | POA: Diagnosis not present

## 2016-10-16 LAB — CBC WITH DIFFERENTIAL/PLATELET
BASO%: 0.1 % (ref 0.0–2.0)
Basophils Absolute: 0 10*3/uL (ref 0.0–0.1)
Basophils Absolute: 0 10*3/uL (ref 0.0–0.1)
Basophils Relative: 0 %
EOS ABS: 0 10*3/uL (ref 0.0–0.7)
EOS PCT: 0 %
EOS%: 0.1 % (ref 0.0–7.0)
Eosinophils Absolute: 0 10*3/uL (ref 0.0–0.5)
HCT: 24.8 % — ABNORMAL LOW (ref 39.0–52.0)
HEMATOCRIT: 25.8 % — AB (ref 38.4–49.9)
HEMOGLOBIN: 8.6 g/dL — AB (ref 13.0–17.1)
Hemoglobin: 8.7 g/dL — ABNORMAL LOW (ref 13.0–17.0)
LYMPH#: 2.1 10*3/uL (ref 0.9–3.3)
LYMPH%: 20.8 % (ref 14.0–49.0)
LYMPHS ABS: 1.1 10*3/uL (ref 0.7–4.0)
LYMPHS PCT: 18 %
MCH: 32 pg (ref 27.2–33.4)
MCH: 33.3 pg (ref 26.0–34.0)
MCHC: 33.3 g/dL (ref 32.0–36.0)
MCHC: 35.1 g/dL (ref 30.0–36.0)
MCV: 95.9 fL (ref 79.3–98.0)
MCV: 95 fL (ref 78.0–100.0)
MONO#: 0.4 10*3/uL (ref 0.1–0.9)
MONO%: 4.3 % (ref 0.0–14.0)
MONOS PCT: 3 %
Monocytes Absolute: 0.2 10*3/uL (ref 0.1–1.0)
NEUT%: 74.7 % (ref 39.0–75.0)
NEUTROS ABS: 7.5 10*3/uL — AB (ref 1.5–6.5)
Neutro Abs: 4.8 10*3/uL (ref 1.7–7.7)
Neutrophils Relative %: 79 %
PLATELETS: 323 10*3/uL (ref 150–400)
Platelets: 245 10*3/uL (ref 140–400)
RBC: 2.69 10*6/uL — ABNORMAL LOW (ref 4.20–5.82)
RBC: 2.61 MIL/uL — ABNORMAL LOW (ref 4.22–5.81)
RDW: 17 % — AB (ref 11.0–14.6)
RDW: 16.7 % — ABNORMAL HIGH (ref 11.5–15.5)
WBC: 10 10*3/uL (ref 4.0–10.3)
WBC: 6.1 10*3/uL (ref 4.0–10.5)

## 2016-10-16 LAB — COMPREHENSIVE METABOLIC PANEL
ALBUMIN: 3 g/dL — AB (ref 3.5–5.0)
ALK PHOS: 56 U/L (ref 40–150)
ALK PHOS: 48 U/L (ref 38–126)
ALT: 13 U/L (ref 0–55)
ALT: 14 U/L — ABNORMAL LOW (ref 17–63)
ANION GAP: 11 meq/L (ref 3–11)
ANION GAP: 18 — AB (ref 5–15)
AST: 11 U/L (ref 5–34)
AST: 22 U/L (ref 15–41)
Albumin: 2.7 g/dL — ABNORMAL LOW (ref 3.5–5.0)
BILIRUBIN TOTAL: 0.36 mg/dL (ref 0.20–1.20)
BUN: 53.3 mg/dL — ABNORMAL HIGH (ref 7.0–26.0)
BUN: 77 mg/dL — ABNORMAL HIGH (ref 6–20)
CALCIUM: 8.5 mg/dL — AB (ref 8.9–10.3)
CO2: 26 mEq/L (ref 22–29)
CO2: 18 mmol/L — ABNORMAL LOW (ref 22–32)
CREATININE: 1.7 mg/dL — AB (ref 0.7–1.3)
CREATININE: 2.36 mg/dL — AB (ref 0.61–1.24)
Calcium: 9.9 mg/dL (ref 8.4–10.4)
Chloride: 101 mEq/L (ref 98–109)
Chloride: 99 mmol/L — ABNORMAL LOW (ref 101–111)
EGFR: 45 mL/min/{1.73_m2} — AB (ref >60)
GFR, EST AFRICAN AMERICAN: 30 mL/min — AB (ref >60)
GFR, EST NON AFRICAN AMERICAN: 26 mL/min — AB (ref >60)
GLUCOSE: 122 mg/dL (ref 70–140)
Glucose, Bld: 194 mg/dL — ABNORMAL HIGH (ref 65–99)
Potassium: 4.2 mEq/L (ref 3.5–5.1)
Potassium: 3.7 mmol/L (ref 3.5–5.1)
SODIUM: 138 meq/L (ref 136–145)
SODIUM: 135 mmol/L (ref 135–145)
TOTAL PROTEIN: 6.1 g/dL — AB (ref 6.4–8.3)
Total Bilirubin: 0.4 mg/dL (ref 0.3–1.2)
Total Protein: 5.4 g/dL — ABNORMAL LOW (ref 6.5–8.1)

## 2016-10-16 LAB — I-STAT CHEM 8, ED
BUN: 69 mg/dL — AB (ref 6–20)
CREATININE: 2.3 mg/dL — AB (ref 0.61–1.24)
Calcium, Ion: 1.12 mmol/L — ABNORMAL LOW (ref 1.15–1.40)
Chloride: 101 mmol/L (ref 101–111)
Glucose, Bld: 156 mg/dL — ABNORMAL HIGH (ref 65–99)
HEMATOCRIT: 21 % — AB (ref 39.0–52.0)
HEMOGLOBIN: 7.1 g/dL — AB (ref 13.0–17.0)
POTASSIUM: 3.7 mmol/L (ref 3.5–5.1)
Sodium: 138 mmol/L (ref 135–145)
TCO2: 16 mmol/L — ABNORMAL LOW (ref 22–32)

## 2016-10-16 LAB — PREPARE RBC (CROSSMATCH)

## 2016-10-16 LAB — POC OCCULT BLOOD, ED: FECAL OCCULT BLD: NEGATIVE

## 2016-10-16 LAB — PROTIME-INR
INR: 0.98
PROTHROMBIN TIME: 12.9 s (ref 11.4–15.2)

## 2016-10-16 LAB — LIPASE, BLOOD: Lipase: 25 U/L (ref 11–51)

## 2016-10-16 MED ORDER — SODIUM CHLORIDE 0.9% FLUSH
10.0000 mL | Freq: Once | INTRAVENOUS | Status: AC
  Administered 2016-10-16: 10 mL
  Filled 2016-10-16: qty 10

## 2016-10-16 MED ORDER — METHYLPREDNISOLONE SODIUM SUCC 40 MG IJ SOLR
INTRAMUSCULAR | Status: AC
  Filled 2016-10-16: qty 1

## 2016-10-16 MED ORDER — PROCHLORPERAZINE MALEATE 10 MG PO TABS
ORAL_TABLET | ORAL | Status: AC
  Filled 2016-10-16: qty 1

## 2016-10-16 MED ORDER — SODIUM CHLORIDE 0.9 % IV BOLUS (SEPSIS)
1000.0000 mL | Freq: Once | INTRAVENOUS | Status: AC
  Administered 2016-10-16: 1000 mL via INTRAVENOUS

## 2016-10-16 MED ORDER — METHYLPREDNISOLONE SODIUM SUCC 40 MG IJ SOLR
40.0000 mg | Freq: Once | INTRAMUSCULAR | Status: AC
  Administered 2016-10-16: 40 mg via INTRAVENOUS

## 2016-10-16 MED ORDER — SODIUM CHLORIDE 0.9 % IV SOLN
10.0000 mL/h | Freq: Once | INTRAVENOUS | Status: AC
  Administered 2016-10-16: 10 mL/h via INTRAVENOUS

## 2016-10-16 MED ORDER — PIPERACILLIN-TAZOBACTAM 3.375 G IVPB 30 MIN
3.3750 g | Freq: Once | INTRAVENOUS | Status: AC
  Administered 2016-10-16: 3.375 g via INTRAVENOUS
  Filled 2016-10-16: qty 50

## 2016-10-16 MED ORDER — ACETAMINOPHEN 325 MG PO TABS
ORAL_TABLET | ORAL | Status: AC
  Filled 2016-10-16: qty 2

## 2016-10-16 MED ORDER — SODIUM CHLORIDE 0.9 % IV SOLN
80.0000 mg | Freq: Once | INTRAVENOUS | Status: AC
  Administered 2016-10-16: 80 mg via INTRAVENOUS
  Filled 2016-10-16: qty 80

## 2016-10-16 MED ORDER — SODIUM CHLORIDE 0.9 % IV SOLN
Freq: Once | INTRAVENOUS | Status: AC
  Administered 2016-10-16: 09:00:00 via INTRAVENOUS

## 2016-10-16 MED ORDER — DARATUMUMAB CHEMO INJECTION 400 MG/20ML
1200.0000 mg | Freq: Once | INTRAVENOUS | Status: AC
  Administered 2016-10-16: 1200 mg via INTRAVENOUS
  Filled 2016-10-16: qty 60

## 2016-10-16 MED ORDER — ACETAMINOPHEN 325 MG PO TABS
650.0000 mg | ORAL_TABLET | Freq: Once | ORAL | Status: AC
  Administered 2016-10-16: 650 mg via ORAL

## 2016-10-16 MED ORDER — SODIUM CHLORIDE 0.9% FLUSH
10.0000 mL | INTRAVENOUS | Status: DC | PRN
  Administered 2016-10-16: 10 mL
  Filled 2016-10-16: qty 10

## 2016-10-16 MED ORDER — PROCHLORPERAZINE MALEATE 10 MG PO TABS
10.0000 mg | ORAL_TABLET | Freq: Once | ORAL | Status: AC
  Administered 2016-10-16: 10 mg via ORAL

## 2016-10-16 MED ORDER — PIPERACILLIN-TAZOBACTAM 3.375 G IVPB
3.3750 g | Freq: Three times a day (TID) | INTRAVENOUS | Status: DC
  Administered 2016-10-17 – 2016-10-18 (×3): 3.375 g via INTRAVENOUS
  Filled 2016-10-16 (×5): qty 50

## 2016-10-16 MED ORDER — SODIUM CHLORIDE 0.9 % IV SOLN: 80.0000 mg | Freq: Once | INTRAVENOUS | Status: DC

## 2016-10-16 MED ORDER — DIPHENHYDRAMINE HCL 25 MG PO CAPS
ORAL_CAPSULE | ORAL | Status: AC
  Filled 2016-10-16: qty 2

## 2016-10-16 MED ORDER — HEPARIN SOD (PORK) LOCK FLUSH 100 UNIT/ML IV SOLN
500.0000 [IU] | Freq: Once | INTRAVENOUS | Status: AC | PRN
  Administered 2016-10-16: 500 [IU]
  Filled 2016-10-16: qty 5

## 2016-10-16 MED ORDER — SODIUM CHLORIDE 0.9 % IV SOLN
8.0000 mg/h | INTRAVENOUS | Status: DC
  Administered 2016-10-16 – 2016-10-17 (×2): 8 mg/h via INTRAVENOUS
  Filled 2016-10-16 (×4): qty 80

## 2016-10-16 MED ORDER — DIPHENHYDRAMINE HCL 25 MG PO CAPS
50.0000 mg | ORAL_CAPSULE | Freq: Once | ORAL | Status: AC
  Administered 2016-10-16: 50 mg via ORAL

## 2016-10-16 NOTE — Assessment & Plan Note (Signed)
We reviewed his recent blood work Given progression of disease, he was started on Pomalyst on 08/14/2016. He has significant pancytopenia due to treatment, resolved Clinically, he appears to be responding very well to treatment  He will continue treatment with Pomalyst 3 weeks on 1 week off. He will continue Daratumumab as scheduled He is reminded to take calcium with vitamin D He will get Zometa every 3 months

## 2016-10-16 NOTE — H&P (Signed)
Calvin Mccormick KCL:[id number] DOB: [birth year] DOA: 10/16/2016     PCP: Lucianne Lei, MD   Outpatient Specialists: Oncology  [facility] Patient coming from:     home Lives   With family    Chief Complaint: coffee ground emesisis  HPI: Calvin Mccormick is a 71 y.o. male with medical history significant of MM    Presented with 24 h of coffee ground emesis x 2 episodes today. On daily aspirin. He's been feeling very weak on arrival was placed on nonrebreather. In ER was found to have dark stool per rectum although Hemoccult negative. No hx of melena prior to today initially hypotensive down to 80. Sp 2L now blood pressures improved and to 110 type and screen ordered but blood has to come from Calvin Mccormick's can be moment 12 hours prior to being available at this point patient's hemodynamically improving. Denies syncope. He had a fall this AM due to generalized fatigue, no full syncope. no head injury.  No chest or abdominal pain. No prior hx of GI bleed.   Regarding pertinent Chronic problems: History of MM followed by Alvy Bimler currently on Daratumumab  History of CK D chronic  IN ER:  Temp (24hrs), Avg:98.3 F (36.8 C), Min:97.9 F (36.6 C), Max:99.1 F (37.3 C)      on arrival  ED Triage Vitals  Enc Vitals Group     BP 10/16/16 1900 (!) 85/68     Pulse Rate 10/16/16 1900 (!) 108     Resp 10/16/16 1906 (!) 29     Temp 10/16/16 1906 99.1 F (37.3 C)     Temp Source 10/16/16 1906 Rectal     SpO2 10/16/16 1900 95 %     Weight --      Height --      Head Circumference --      Peak Flow --      Pain Score 10/16/16 1908 0     Pain Loc --      Pain Edu? --      Excl. in [place]? --     Latest RR 26 98% HR 101 BP 102/65 WBC 6.1 Hg 8.7 stable from the 10th PLT 323 Na 135 K 3.7 BUN 77 up from prior Cr 2.36 up from 1.7 on the earlier today alb 2.7 Lipase 25  CXR_ lung base opacities possible early pulmonary edema but could be atypical infection  Following Medications were ordered  in ER: Medications  pantoprazole (PROTONIX) 80 mg in sodium chloride 0.9 % 250 mL (0.32 mg/mL) infusion (not administered)  0.9 %  sodium chloride infusion (not administered)  sodium chloride 0.9 % bolus 1,000 mL (not administered)  sodium chloride 0.9 % bolus 1,000 mL (1,000 mLs Intravenous New Bag/Given 10/16/16 2013)  pantoprazole (PROTONIX) 80 mg in sodium chloride 0.9 % 100 mL IVPB (0 mg Intravenous Stopped 10/16/16 2105)     ER provider discussed case with: Dr. Cristina Gong Who recommends:admit to medicine, protonix drip We'll see patient in consult in the morning   Hospitalist was called for admission for Upper Gi bleed  Review of Systems:    Pertinent positives include: presyncope fatigue,  nausea, vomiting, constipation Constitutional:  No weight loss, night sweats, Fevers, chills, weight loss  HEENT:  No headaches, Difficulty swallowing,Tooth/dental problems,Sore throat,  No sneezing, itching, ear ache, nasal congestion, post nasal drip,  Cardio-vascular:  No chest pain, Orthopnea, PND, anasarca, dizziness, palpitations.no Bilateral lower extremity swelling  GI:  No heartburn, indigestion, abdominal pain,  diarrhea, change in bowel habits, loss of appetite, melena, blood in stool, hematemesis Resp:  no shortness of breath at rest. No dyspnea on exertion, No excess mucus, no productive cough, No non-productive cough, No coughing up of blood.No change in color of mucus. No wheezing. Skin:  no rash or lesions. No jaundice GU:  no dysuria, change in color of urine, no urgency or frequency. No straining to urinate.  No flank pain.  Musculoskeletal:  No joint pain or no joint swelling. No decreased range of motion. No back pain.  Psych:  No change in mood or affect. No depression or anxiety. No memory loss.  Neuro: no localizing neurological complaints, no tingling, no weakness, no double vision, no gait abnormality, no slurred speech, no confusion  As per HPI otherwise 10  point review of systems negative.   Past Medical History: Past Medical History:  Diagnosis Date  . Anemia 11/27/2010  . Cancer ([place])   . Chronic renal insufficiency, stage III (moderate) ([place]) 06/17/2011   Due to myeloma & HTN  . CKD (chronic kidney disease), stage III ([place]) 02/05/2011  . 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 Diarrhea 06/17/2011   Hospital admission 05/30/11 velcade toxicity? Infectious?  Marland Kitchen Hyperlipemia   . Hypertension, benign essential, goal below 140/90 11/27/2010  . Hypokalemia with normal acid-base balance 07/29/2011  . Hypomagnesemia 07/29/2011  . Seasonal allergies    Past Surgical History:  Procedure Laterality Date  . COLONOSCOPY  07/26/2011   Procedure: COLONOSCOPY;  Surgeon: Cleotis Nipper, MD;  Location: [facility] END[facility]COPY;  Service: Endoscopy;  Laterality: N/A;  . EYE SURGERY     bilateral cataract  surgery  . MULTIPLE EXTRACTIONS WITH ALVEOLOPLASTY N/A 05/27/2014   Procedure: Extraction of tooth #'s 1,6,7,8,10,11,13,21,22,23,24,25,26,27 and 28 with alveoloplasty;  Surgeon: Lenn Cal, DDS;  Location: [facility] ORS;  Service: Oral Surgery;  Laterality: N/A;  . porta cath      for chemotherapy- right chest area     Social History:  Ambulatory cane     reports that he has never smoked. He has never used smokeless tobacco. He reports that he does not drink alcohol or use drugs.  Allergies:   Allergies  Allergen Reactions  . Lactose Intolerance (Gi) Nausea And Vomiting    Pt said he is not ??       Family History:   Family History  Problem Relation Age of Onset  . Cancer Brother        lung ca    Medications: Prior to Admission medications   Medication Sig Start Date End Date Taking? Authorizing Provider  acyclovir (ZOVIRAX) 400 MG tablet Take 1 tablet (400 mg total) by mouth daily. 02/22/16  Yes Gorsuch, Ni, MD  allopurinol (ZYLOPRIM) 100 MG tablet Take 100 mg by mouth 2 (two) times daily.  12/06/12  Yes [provider]  amLODipine  (NORVASC) 10 MG tablet Take 10 mg by mouth daily. 07/19/16  Yes [provider]  aspirin EC 81 MG tablet Take 81 mg by mouth daily.   Yes [provider]  atorvastatin (LIPITOR) 20 MG tablet Take 20 mg by mouth at bedtime.    Yes [provider]  brimonidine (ALPHAGAN) 0.15 % ophthalmic solution Place 1 drop into the right eye 3 (three) times daily.   Yes [provider]  cholecalciferol (VITAMIN D) 1000 UNITS tablet Take 1,000 Units by mouth daily.   Yes [provider]  dexamethasone (DECADRON) 4 MG tablet Take 5 tablets every Mondays 02/22/16  Yes Gorsuch, Ni, MD  dorzolamide-timolol (COSOPT) 22.3-6.8 MG/ML ophthalmic solution Place 1 drop into both eyes 2 (two) times daily.  07/29/13  Yes [provider]  fluorometholone (FML) 0.1 % ophthalmic suspension Place 1 drop into both eyes daily.   Yes [provider]  hydrochlorothiazide (HYDRODIURIL) 25 MG tablet Take 25 mg by mouth daily. 08/01/16  Yes [provider]  KLOR-CON M20 20 MEQ tablet Take 20 mEq by mouth daily. 09/18/16  Yes [provider]  latanoprost (XALATAN) 0.005 % ophthalmic solution Place 1 drop into the right eye at bedtime.   Yes [provider]  lidocaine-prilocaine (EMLA) cream Apply to affected area once 02/22/16  Yes Gorsuch, Ni, MD  loperamide (IMODIUM A-D) 2 MG tablet 1 po after each watery BM.  Maximum 6 per 24hrs. 10/01/11  Yes Annia Belt, MD  ondansetron (ZOFRAN) 8 MG tablet Take 1 tablet (8 mg total) by mouth 2 (two) times daily as needed (Nausea or vomiting). 04/03/16  Yes Gorsuch, Ni, MD  pomalidomide (POMALYST) 2 MG capsule Take 1 capsule (2 mg total) by mouth daily. Take with water on days 1-21. Repeat every 28 days. 10/07/16  Yes Heath Lark, MD  prochlorperazine (COMPAZINE) 10 MG tablet Take 1 tablet (10 mg total) by mouth every 6 (six) hours as needed (Nausea or vomiting). 02/22/16  Yes Gorsuch, Ni, MD  lidocaine-prilocaine  (EMLA) cream Apply 1 application topically as needed. Patient not taking: Reported on 10/16/2016 05/19/14   Heath Lark, MD    Physical Exam: Patient Vitals for the past 24 hrs:  BP Temp Temp src Pulse Resp SpO2  10/16/16 1930 93/67 - - (!) 115 (!) 30 100 %  10/16/16 1906 (!) 87/59 99.1 F (37.3 C) Rectal (!) 107 (!) 29 100 %  10/16/16 1900 (!) 85/68 - - (!) 108 - 95 %    1. General:  in No Acute distress   Chronically ill -appearing 2. Psychological: lethargic  but   Oriented 3. Head/ENT:     Dry Mucous Membranes                          Head Non traumatic, neck supple                         Poor Dentition 4. SKIN:   decreased Skin turgor,  Skin clean Dry and intact no rash 5. Heart: Regular rate and rhythm no Murmur, no Rub or gallop 6. Lungs:  Clear to auscultation bilaterally, no wheezes or crackles   7. Abdomen: Soft,  non-tender, Non distended   bowel sounds present 8. Lower extremities: no clubbing, cyanosis, or edema 9. Neurologically Grossly intact, moving all 4 extremities equally   10. MSK: Normal range of motion   body mass index is unknown because there is no height or weight on file.  Labs on Admission:   Labs on Admission: I have personally reviewed following labs and imaging studies  CBC:  Recent Labs Lab 10/16/16 0747 10/16/16 1948  WBC 10.0 6.1  NEUTROABS 7.5* 4.8  HGB 8.6* 8.7*  HCT 25.8* 24.8*  MCV 95.9 95.0  PLT 245 975   Basic Metabolic Panel:  Recent Labs Lab 10/16/16 0748 10/16/16 1948  NA 138 135  K 4.2 3.7  CL  --  99*  CO2 26 18*  GLUCOSE 122 194*  BUN 53.3* 77*  CREATININE 1.7* 2.36*  CALCIUM 9.9 8.5*  GFR: CrCl cannot be calculated (Unknown ideal weight.). Liver Function Tests:  Recent Labs Lab 10/16/16 0748 10/16/16 1948  AST 11 22  ALT 13 14*  ALKPHOS 56 48  BILITOT 0.36 0.4  PROT 6.1* 5.4*  ALBUMIN 3.0* 2.7*    Recent Labs Lab 10/16/16 1948  LIPASE 25   No results for input(s): AMMONIA in the last 168  hours. Coagulation Profile:  Recent Labs Lab 10/16/16 1948  INR 0.98   Cardiac Enzymes: No results for input(s): CKTOTAL, CKMB, CKMBINDEX, TROPONINI in the last 168 hours. BNP (last 3 results) No results for input(s): PROBNP in the last 8760 hours. HbA1C: No results for input(s): HGBA1C in the last 72 hours. CBG: No results for input(s): GLUCAP in the last 168 hours. Lipid Profile: No results for input(s): CHOL, HDL, LDLCALC, TRIG, CHOLHDL, LDLDIRECT in the last 72 hours. Thyroid Function Tests: No results for input(s): TSH, T4TOTAL, FREET4, T3FREE, THYROIDAB in the last 72 hours. Anemia Panel: No results for input(s): VITAMINB12, FOLATE, FERRITIN, TIBC, IRON, RETICCTPCT in the last 72 hours. Urine analysis:  Sepsis Labs: _0 (procalcitonin:4,lacticidven:4) )No results found for this or any previous visit (from the past 240 hour(s)).     UA  ordered  No results found for: HGBA1C  CrCl cannot be calculated (Unknown ideal weight.).  BNP (last 3 results) No results for input(s): PROBNP in the last 8760 hours.   ECG REPORT Not obtained There were no vitals filed for this visit.   Cultures:    Component Value Date/Time   SDES URINE, CLEAN CATCH 04/08/2015 0129   SPECREQUEST NONE 04/08/2015 0129   CULT  04/08/2015 0129    MULTIPLE SPECIES PRESENT, SUGGEST RECOLLECTION Performed at [facility] 04/09/2015 FINAL 04/08/2015 0129     Radiological Exams on Admission: Dg Chest Port 1 View  Result Date: 10/16/2016 CLINICAL DATA:  71 year old male with a history of vomiting. EXAM: PORTABLE CHEST 1 VIEW COMPARISON:  04/07/2015 FINDINGS: Cardiomediastinal silhouette unchanged in size and contour. Patchy opacities at the bilateral lung bases with mild interlobular septal thickening. No pneumothorax. No large pleural effusion. Port catheter on the right chest wall via IJ approach with the catheter appearing to terminate in the superior vena cava. No  displaced fracture IMPRESSION: Ill-defined opacities at the lung bases with interlobular septal thickening suggesting early pulmonary edema, however, atypical infection not excluded. Right IJ port catheter. Electronically Signed   By: Corrie Mckusick D.O.   On: 10/16/2016 19:48    Chart has been reviewed    Assessment/Plan  71 y.o. male with medical history significant of MM Admitted for upper GI bleed  Present on Admission: . Upper GI bleed -  - [place] Blatchford score BUN >18.2  , Hg <27O  systolic BP <350  HR >093  , melena pre-syncope       >1 Justifies admission and aggressive management      Modifying risk factors include:     NSAIDS use  steroid use         -    AIMS 65 = Alb <3,    Mental status changes, SBP <90 (0-4) TOTAL of 3  Worrisome       -   hemodynamic instability present now somewhat improved with IV fluids      - Admit to stepdown given above   -  ER  Provider spoke to gastroenterology ( [facility], ) they will see patient in a.m. appreciate their consult   - serial CBC.    -  Monitor for any recurrence,  evidence of hemodynamic instability or significant blood loss  - Transfuse as needed for hemoglobin below 7 or evidence of life-threatening bleeding. Blood ordered but has to, from [facility] if is any evidence of recurrence or worsening anemia we'll transfuse O negative in case of emergency  - Establish at least 2 PIV and fluid resuscitate   - clear liquids for tonight keep nothing by mouth post midnight,   -  administer Protonix   drip    . Aspiration pneumonia ([place])  in the setting of nausea and vomiting, Continue Zosyn obtain sputum cultures . Protein calorie malnutrition ([place])- would benefit from nutritional consult once stable . Multiple myeloma in relapse [facility]) - emailed primary oncologist patient has been admitted . HTN (hypertension) given soft blood pressures will hold home medications for now . CKD (chronic kidney disease), stage III ([facility]) - worsening renal  function will rehydrate and follow . Acute renal failure superimposed on stage 3 chronic kidney disease ([place]) by dissected to diminished by mouth intake will rehydrate obtain urine electrolytes      Other plan as per orders.  DVT prophylaxis:  SCD   Code Status:  FULL CODE   as per patient   Family Communication:   Family   at  Bedside  plan of care was discussed with   Daughter  Wife,    Disposition Plan:    To home once workup is complete and patient is stable    Consults called: GI [facility], emailed to hematology   Admission status:  inpatient     Level of care      SDU      I have spent a total of 56 min on this admission    Damarea Merkel 10/17/2016, 1:56 AM    [facility]  Pager [phone]   after 2 AM please page floor coverage PA If 7AM-7PM, please contact the day team taking care of the patient  [email or web address]  Password [email or web address]

## 2016-10-16 NOTE — Patient Instructions (Signed)
[facility] Discharge Instructions for Patients Receiving Chemotherapy  Today you received the following chemotherapy agents: Darzalex  To help prevent nausea and vomiting after your treatment, we encourage you to take your nausea medication as directed.    If you develop nausea and vomiting that is not controlled by your nausea medication, call the clinic.   BELOW ARE SYMPTOMS THAT SHOULD BE REPORTED IMMEDIATELY:  *FEVER GREATER THAN 100.5 F  *CHILLS WITH OR WITHOUT FEVER  NAUSEA AND VOMITING THAT IS NOT CONTROLLED WITH YOUR NAUSEA MEDICATION  *UNUSUAL SHORTNESS OF BREATH  *UNUSUAL BRUISING OR BLEEDING  TENDERNESS IN MOUTH AND THROAT WITH OR WITHOUT PRESENCE OF ULCERS  *URINARY PROBLEMS  *BOWEL PROBLEMS  UNUSUAL RASH Items with * indicate a potential emergency and should be followed up as soon as possible.  Feel free to call the clinic should you have any questions or concerns. The clinic phone number is [phone].  Please show the CHEMO ALERT CARD at check-in to the Emergency Department and triage nurse.   

## 2016-10-16 NOTE — Telephone Encounter (Signed)
err

## 2016-10-16 NOTE — Assessment & Plan Note (Signed)
This is likely due to recent treatment. The patient denies recent history of bleeding such as epistaxis, hematuria or hematochezia. He is asymptomatic from the anemia. I will observe for now.  He does not require transfusion now. I will continue the chemotherapy at current dose without dosage adjustment.  If the anemia gets progressive worse in the future, I might have to delay his treatment or adjust the chemotherapy dose.  

## 2016-10-16 NOTE — Assessment & Plan Note (Signed)
This is chronic in nature. It is stable. Recommend observation only. We'll give him a reduced dose Zometa twice a year due to chronic renal failure He does not require dose adjustment for his chemotherapy.

## 2016-10-16 NOTE — ED Provider Notes (Signed)
[facility] DEPT Provider Note   CSN: [id number] Arrival date & time: 10/16/16  1836     History   Chief Complaint Chief Complaint  Patient presents with  . Hematemesis    HPI DWAN HEMMELGARN is a 71 y.o. male history of CAD, multiple myeloma with recent recurrence on chemotherapy, here presenting with vomiting. Patient had 2 episodes of coffee ground emesis this afternoon. He denies any abdominal pain or any blood in his stool. Patient just saw oncology earlier today was noted that he have progression of his disease and will be continuing chemotherapy. Patient is on baby aspirin right now and denies being on blood thinners.  The history is provided by the patient.    Past Medical History:  Diagnosis Date  . Anemia 11/27/2010  . Cancer ([place])   . Chronic renal insufficiency, stage III (moderate) ([place]) 06/17/2011   Due to myeloma & HTN  . CKD (chronic kidney disease), stage III ([place]) 02/05/2011  . 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 Diarrhea 06/17/2011   Hospital admission 05/30/11 velcade toxicity? Infectious?  Marland Kitchen Hyperlipemia   . Hypertension, benign essential, goal below 140/90 11/27/2010  . Hypokalemia with normal acid-base balance 07/29/2011  . Hypomagnesemia 07/29/2011  . Seasonal allergies     Patient Active Problem List   Diagnosis Date Noted  . Gait instability 05/22/2016  . Physical debility 05/22/2016  . Pancytopenia, acquired ([place]) 03/27/2016  . Goals of care, counseling/discussion 02/25/2016  . Port catheter in place 01/15/2016  . Acute neck pain 11/23/2015  . Renal failure 04/08/2015  . Acute renal failure (ARF) ([place]) 04/08/2015  . Acute renal failure superimposed on stage 3 chronic kidney disease ([place]) 04/08/2015  . Nausea and vomiting 04/08/2015  . Hypocalcemia 04/08/2015  . Pancytopenia due to antineoplastic chemotherapy ([place]) 11/15/2014  . 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 Protein calorie malnutrition ([place]) 10/06/2014  . Neuropathy due to chemotherapeutic drug  ([place]) 02/21/2014  . Cellulitis and abscess of hand 08/05/2011  . Hypomagnesemia 07/29/2011  . Hypokalemia with normal acid-base balance 07/29/2011  . HTN (hypertension) 07/24/2011  . Anemia in chronic renal disease 07/24/2011  . Diarrhea 06/17/2011  . Chronic renal insufficiency, stage III (moderate) ([place]) 06/17/2011  . CKD (chronic kidney disease), stage III ([place]) 02/05/2011  . Multiple myeloma in relapse ([place]) 12/12/2010  . 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 COLONOSCOPY  07/26/2011   Procedure: COLONOSCOPY;  Surgeon: Cleotis Nipper, MD;  Location: [facility] ENDOSCOPY;  Service: Endoscopy;  Laterality: N/A;  . EYE SURGERY     bilateral cataract  surgery  . MULTIPLE EXTRACTIONS WITH ALVEOLOPLASTY N/A 05/27/2014   Procedure: Extraction of tooth #'s 1,6,7,8,10,11,13,21,22,23,24,25,26,27 and 28 with alveoloplasty;  Surgeon: Lenn Cal, DDS;  Location: [facility] ORS;  Service: Oral Surgery;  Laterality: N/A;  . porta cath      for chemotherapy- right chest area       Home Medications    Prior to Admission medications   Medication Sig Start Date End Date Taking? Authorizing Provider  acyclovir (ZOVIRAX) 400 MG tablet Take 1 tablet (400 mg total) by mouth daily. 02/22/16  Yes Gorsuch, Ni, MD  allopurinol (ZYLOPRIM) 100 MG tablet Take 100 mg by mouth 2 (two) times daily.  12/06/12  Yes [provider]  amLODipine (NORVASC) 10 MG tablet Take 10 mg by mouth daily. 07/19/16  Yes [provider]  aspirin EC 81 MG tablet Take 81 mg by mouth daily.   Yes  [provider]  atorvastatin (LIPITOR) 20 MG tablet Take 20 mg by mouth at bedtime.    Yes [provider]  brimonidine (ALPHAGAN) 0.15 % ophthalmic solution Place 1 drop into the right eye 3 (three) times daily.   Yes [provider]  cholecalciferol (VITAMIN D) 1000 UNITS tablet Take 1,000 Units by mouth daily.   Yes [provider]    dexamethasone (DECADRON) 4 MG tablet Take 5 tablets every Mondays 02/22/16  Yes Gorsuch, Ni, MD  dorzolamide-timolol (COSOPT) 22.3-6.8 MG/ML ophthalmic solution Place 1 drop into both eyes 2 (two) times daily.  07/29/13  Yes [provider]  fluorometholone (FML) 0.1 % ophthalmic suspension Place 1 drop into both eyes daily.   Yes [provider]  hydrochlorothiazide (HYDRODIURIL) 25 MG tablet Take 25 mg by mouth daily. 08/01/16  Yes [provider]  KLOR-CON M20 20 MEQ tablet Take 20 mEq by mouth daily. 09/18/16  Yes [provider]  latanoprost (XALATAN) 0.005 % ophthalmic solution Place 1 drop into the right eye at bedtime.   Yes [provider]  lidocaine-prilocaine (EMLA) cream Apply to affected area once 02/22/16  Yes Gorsuch, Ni, MD  loperamide (IMODIUM A-D) 2 MG tablet 1 po after each watery BM.  Maximum 6 per 24hrs. 10/01/11  Yes Annia Belt, MD  ondansetron (ZOFRAN) 8 MG tablet Take 1 tablet (8 mg total) by mouth 2 (two) times daily as needed (Nausea or vomiting). 04/03/16  Yes Gorsuch, Ni, MD  pomalidomide (POMALYST) 2 MG capsule Take 1 capsule (2 mg total) by mouth daily. Take with water on days 1-21. Repeat every 28 days. 10/07/16  Yes Heath Lark, MD  prochlorperazine (COMPAZINE) 10 MG tablet Take 1 tablet (10 mg total) by mouth every 6 (six) hours as needed (Nausea or vomiting). 02/22/16  Yes Gorsuch, Ni, MD  lidocaine-prilocaine (EMLA) cream Apply 1 application topically as needed. Patient not taking: Reported on 10/16/2016 05/19/14   Heath Lark, MD    Family History Family History  Problem Relation Age of Onset  . Cancer Brother        lung ca    Social History Social History  Substance Use Topics  . Smoking status: Never Smoker  . Smokeless tobacco: Never Used  . Alcohol use No     Allergies   Lactose intolerance (gi)   Review of Systems Review of Systems  Gastrointestinal: Positive for vomiting.  All other systems  reviewed and are negative.    Physical Exam Updated Vital Signs BP 93/67   Pulse (!) 115   Temp 99.1 F (37.3 C) (Rectal)   Resp (!) 30   SpO2 100%   Physical Exam  Constitutional: He is oriented to person, place, and time.  Ill appearing, pale   HENT:  Head: Normocephalic.  MM dry   Eyes: Pupils are equal, round, and reactive to light.  Conjunctiva pale   Neck: Normal range of motion. Neck supple.  Cardiovascular: Normal rate, regular rhythm and normal heart sounds.   Pulmonary/Chest: Effort normal.  Diminished bilateral bases   Abdominal: Soft. Bowel sounds are normal. He exhibits no distension. There is no tenderness.  Musculoskeletal: Normal range of motion.  Neurological: He is alert and oriented to person, place, and time.  Skin: Skin is warm.  Psychiatric: He has a normal mood and affect.  Nursing note and vitals reviewed.    ED Treatments / Results  Labs (all labs ordered are listed, but only abnormal results are displayed) Labs  Reviewed  CBC WITH DIFFERENTIAL/PLATELET - Abnormal; Notable for the following:       Result Value   RBC 2.61 (*)    Hemoglobin 8.7 (*)    HCT 24.8 (*)    RDW 16.7 (*)    All other components within normal limits  COMPREHENSIVE METABOLIC PANEL - Abnormal; Notable for the following:    Chloride 99 (*)    CO2 18 (*)    Glucose, Bld 194 (*)    BUN 77 (*)    Creatinine, Ser 2.36 (*)    Calcium 8.5 (*)    Total Protein 5.4 (*)    Albumin 2.7 (*)    ALT 14 (*)    GFR calc non Af Amer 26 (*)    GFR calc Af Amer 30 (*)    Anion gap 18 (*)    All other components within normal limits  PROTIME-INR  LIPASE, BLOOD  POC OCCULT BLOOD, ED  I-STAT CHEM 8, ED  TYPE AND SCREEN  PREPARE RBC (CROSSMATCH)    EKG  EKG Interpretation None       Radiology Dg Chest Port 1 View  Result Date: 10/16/2016 CLINICAL DATA:  71 year old male with a history of vomiting. EXAM: PORTABLE CHEST 1 VIEW COMPARISON:  04/07/2015 FINDINGS:  Cardiomediastinal silhouette unchanged in size and contour. Patchy opacities at the bilateral lung bases with mild interlobular septal thickening. No pneumothorax. No large pleural effusion. Port catheter on the right chest wall via IJ approach with the catheter appearing to terminate in the superior vena cava. No displaced fracture IMPRESSION: Ill-defined opacities at the lung bases with interlobular septal thickening suggesting early pulmonary edema, however, atypical infection not excluded. Right IJ port catheter. Electronically Signed   By: Corrie Mckusick D.O.   On: 10/16/2016 19:48    Procedures Procedures (including critical care time)  CRITICAL CARE Performed by: Wandra Arthurs   Total critical care time: 30 minutes  Critical care time was exclusive of separately billable procedures and treating other patients.  Critical care was necessary to treat or prevent imminent or life-threatening deterioration.  Critical care was time spent personally by me on the following activities: development of treatment plan with patient and/or surrogate as well as nursing, discussions with consultants, evaluation of patient's response to treatment, examination of patient, obtaining history from patient or surrogate, ordering and performing treatments and interventions, ordering and review of laboratory studies, ordering and review of radiographic studies, pulse oximetry and re-evaluation of patient's condition.   Medications Ordered in ED Medications  pantoprazole (PROTONIX) 80 mg in sodium chloride 0.9 % 250 mL (0.32 mg/mL) infusion (not administered)  0.9 %  sodium chloride infusion (not administered)  sodium chloride 0.9 % bolus 1,000 mL (not administered)  sodium chloride 0.9 % bolus 1,000 mL (1,000 mLs Intravenous New Bag/Given 10/16/16 2013)  pantoprazole (PROTONIX) 80 mg in sodium chloride 0.9 % 100 mL IVPB (0 mg Intravenous Stopped 10/16/16 2105)     Initial Impression / Assessment and Plan /  ED Course  I have reviewed the triage vital signs and the nursing notes.  Pertinent labs & imaging results that were available during my care of the patient were reviewed by me and considered in my medical decision making (see chart for details).    Nadir GONZALO WAYMIRE is a 71 y.o. male here with coffee ground emesis. Concerned for possible upper GI bleed. Has baseline anemia and CKD from myeloma. Will start protonix IV bolus and drip. Will transfuse blood given  hypotension.   9:29 PM Hg 8.7. Cr increased to 2.3, up from 1.7. BUN increased to 77. CXR showed mild pulmonary edema. Likely aspirated into his lungs. I was able to get him down to 2 L [place] and O2 95-98%. I called Dr. Cristina Gong who thinks likely aspirin gastritis. He agrees with protonix IV and drip. Ordered 1 U PRBC due to hypotension but he has multiple myeloma and has multiple antibodies so blood will have to come from [place]. BP improved to low 100s and has no active bleeding so will not need emergent transfusion. Will admit to stepdown.   Final Clinical Impressions(s) / ED Diagnoses   Final diagnoses:  None    New Prescriptions New Prescriptions   No medications on file     Drenda Freeze, MD 10/16/16 2135

## 2016-10-16 NOTE — ED Notes (Signed)
Blood bank called and stated that it is going to be >12 hours to get blood for patient-has to be ordered from [place]-due to patient's chemo he is receiving.

## 2016-10-16 NOTE — ED Notes (Signed)
Bed: OG00 Expected date:  Expected time:  Means of arrival:  Comments: Hold Triage 1

## 2016-10-16 NOTE — ED Triage Notes (Signed)
He reports vomiting x 2 this afternoon. I witness him having a black liquid emesis while in our dep't. He is very weak in appearance and in no distress. Dr. Darl Householder meets pt. Upon arrival.

## 2016-10-16 NOTE — Progress Notes (Signed)
[facility] OFFICE PROGRESS NOTE  Patient Care Team: Lucianne Lei, MD as PCP - General (Family Medicine) Heath Lark, MD as Consulting Physician (Hematology and Oncology) Pleasant, [name], RN as [place] Management  SUMMARY OF ONCOLOGIC HISTORY:   Multiple myeloma in relapse [facility])   12/12/2010 Initial Diagnosis    Multiple myeloma      02/16/2014 Imaging    Skeletal survey showed diffuse osteopenia      03/02/2014 Bone Marrow Biopsy    Accession: [id number] BM biopsy showed only 5 % plasma cells. However, the biopsy was difficult and the bone was fragmented during the procedure. Cytogenetics and FISH study is normal      03/03/2014 Procedure    he has port placement      03/10/2014 - 05/19/2014 Chemotherapy    he received elotuzumab and revlimid      05/20/2014 - 02/25/2016 Chemotherapy    Treatment is switched to maintenance Revlimid only. Treatment is stopped due to progressive disease      11/15/2014 Adverse Reaction    He has mild worsening anemia. Does of Revlimid reduced to 5 mg 21 days on, 7 days off      03/06/2016 -  Chemotherapy    He received Daratumumab and Velcade. Velcade is stopped on 07/24/16      08/14/2016 Miscellaneous    Pomalyst is added along with Daratumumab       INTERVAL HISTORY: Please see below for problem oriented charting. He is seen in the infusion room He is doing well His recent infection No new bone pain The patient denies any recent signs or symptoms of bleeding such as spontaneous epistaxis, hematuria or hematochezia. He has no problems getting Pomalyst refill  REVIEW OF SYSTEMS:   Constitutional: Denies fevers, chills or abnormal weight loss Eyes: Denies blurriness of vision Ears, nose, mouth, throat, and face: Denies mucositis or sore throat Respiratory: Denies cough, dyspnea or wheezes Cardiovascular: Denies palpitation, chest discomfort or lower extremity swelling Gastrointestinal:  Denies  nausea, heartburn or change in bowel habits Skin: Denies abnormal skin rashes Lymphatics: Denies new lymphadenopathy or easy bruising Neurological:Denies numbness, tingling or new weaknesses Behavioral/Psych: Mood is stable, no new changes  All other systems were reviewed with the patient and are negative.  I have reviewed the past medical history, past surgical history, social history and family history with the patient and they are unchanged from previous note.  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  MEDICATIONS:  Current Outpatient Prescriptions  Medication Sig Dispense Refill  . acyclovir (ZOVIRAX) 400 MG tablet Take 1 tablet (400 mg total) by mouth daily. 60 tablet 11  . allopurinol (ZYLOPRIM) 100 MG tablet Take 100 mg by mouth 2 (two) times daily.     Marland Kitchen aspirin EC 81 MG tablet Take 81 mg by mouth daily.    Marland Kitchen atorvastatin (LIPITOR) 20 MG tablet Take 20 mg by mouth at bedtime.     . brimonidine (ALPHAGAN) 0.15 % ophthalmic solution Place 1 drop into the right eye 3 (three) times daily.    . cholecalciferol (VITAMIN D) 1000 UNITS tablet Take 1,000 Units by mouth daily.    Marland Kitchen dexamethasone (DECADRON) 4 MG tablet Take 5 tablets every Mondays 20 tablet 4  . dorzolamide-timolol (COSOPT) 22.3-6.8 MG/ML ophthalmic solution Place 1 drop into both eyes 2 (two) times daily.     . fluorometholone (FML) 0.1 % ophthalmic suspension Place 1 drop into both eyes daily.    Marland Kitchen latanoprost (  XALATAN) 0.005 % ophthalmic solution Place 1 drop into the right eye at bedtime.    . lidocaine-prilocaine (EMLA) cream Apply 1 application topically as needed. 30 g 1  . lidocaine-prilocaine (EMLA) cream Apply to affected area once 30 g 3  . loperamide (IMODIUM A-D) 2 MG tablet 1 po after each watery BM.  Maximum 6 per 24hrs. 30 tablet PRN  . ondansetron (ZOFRAN) 8 MG tablet Take 1 tablet (8 mg total) by mouth 2 (two) times daily as needed (Nausea or vomiting). 30 tablet 1  . pomalidomide (POMALYST) 2  MG capsule Take 1 capsule (2 mg total) by mouth daily. Take with water on days 1-21. Repeat every 28 days. 21 capsule 11  . prochlorperazine (COMPAZINE) 10 MG tablet Take 1 tablet (10 mg total) by mouth every 6 (six) hours as needed (Nausea or vomiting). 30 tablet 1   No current facility-administered medications for this visit.    Facility-Administered Medications Ordered in Other Visits  Medication Dose Route Frequency Provider Last Rate Last Dose  . 0.9 %  sodium chloride infusion   Intravenous Once Alvy Bimler, Orvil Faraone, MD      . acetaminophen (TYLENOL) tablet 650 mg  650 mg Oral Once Alvy Bimler, Rosann Gorum, MD      . daratumumab (DARZALEX) 1,260 mg in sodium chloride 0.9 % 437 mL chemo infusion  16 mg/kg (Treatment Plan Recorded) Intravenous Once Alvy Bimler, Adrean Findlay, MD      . diphenhydrAMINE (BENADRYL) capsule 50 mg  50 mg Oral Once Alvy Bimler, Angeldejesus Callaham, MD      . heparin lock flush 100 unit/mL  500 Units Intracatheter Once PRN Alvy Bimler, Cerina Leary, MD      . methylPREDNISolone sodium succinate (SOLU-MEDROL) 40 mg/mL injection 40 mg  40 mg Intravenous Once Alvy Bimler, Dedrick Heffner, MD      . prochlorperazine (COMPAZINE) tablet 10 mg  10 mg Oral Once Alvy Bimler, Slevin Gunby, MD      . sodium chloride flush (NS) 0.9 % injection 10 mL  10 mL Intracatheter PRN Alvy Bimler, Shaunte Weissinger, MD        PHYSICAL EXAMINATION: ECOG PERFORMANCE STATUS: 1 - Symptomatic but completely ambulatory  GENERAL:alert, no distress and comfortable SKIN: skin color, texture, turgor are normal, no rashes or significant lesions EYES: normal, Conjunctiva are pink and non-injected, sclera clear OROPHARYNX:no exudate, no erythema and lips, buccal mucosa, and tongue normal  NECK: supple, thyroid normal size, non-tender, without nodularity LYMPH:  no palpable lymphadenopathy in the cervical, axillary or inguinal LUNGS: clear to auscultation and percussion with normal breathing effort HEART: regular rate & rhythm and no murmurs and no lower extremity edema ABDOMEN:abdomen soft, non-tender and normal  bowel sounds Musculoskeletal:no cyanosis of digits and no clubbing  NEURO: alert & oriented x 3 with fluent speech, no focal motor/sensory deficits  LABORATORY DATA:  I have reviewed the data as listed    Component Value Date/Time   NA 138 10/16/2016 0748   K 4.2 10/16/2016 0748   CL 108 04/10/2015 0405   CL 107 06/29/2012 1131   CO2 26 10/16/2016 0748   GLUCOSE 122 10/16/2016 0748   GLUCOSE 143 (H) 06/29/2012 1131   BUN 53.3 (H) 10/16/2016 0748   CREATININE 1.7 (H) 10/16/2016 0748   CALCIUM 9.9 10/16/2016 0748   PROT 6.1 (L) 10/16/2016 0748   ALBUMIN 3.0 (L) 10/16/2016 0748   AST 11 10/16/2016 0748   ALT 13 10/16/2016 0748   ALKPHOS 56 10/16/2016 0748   BILITOT 0.36 10/16/2016 0748   GFRNONAA 38 (L) 04/10/2015 0405  GFRAA 44 (L) 04/10/2015 0405    No results found for: SPEP, UPEP  Lab Results  Component Value Date   WBC 10.0 10/16/2016   NEUTROABS 7.5 (H) 10/16/2016   HGB 8.6 (L) 10/16/2016   HCT 25.8 (L) 10/16/2016   MCV 95.9 10/16/2016   PLT 245 10/16/2016      Chemistry      Component Value Date/Time   NA 138 10/16/2016 0748   K 4.2 10/16/2016 0748   CL 108 04/10/2015 0405   CL 107 06/29/2012 1131   CO2 26 10/16/2016 0748   BUN 53.3 (H) 10/16/2016 0748   CREATININE 1.7 (H) 10/16/2016 0748      Component Value Date/Time   CALCIUM 9.9 10/16/2016 0748   ALKPHOS 56 10/16/2016 0748   AST 11 10/16/2016 0748   ALT 13 10/16/2016 0748   BILITOT 0.36 10/16/2016 0748      ASSESSMENT & PLAN:  Multiple myeloma in relapse ([place]) We reviewed his recent blood work Given progression of disease, he was started on Pomalyst on 08/14/2016. He has significant pancytopenia due to treatment, resolved Clinically, he appears to be responding very well to treatment  He will continue treatment with Pomalyst 3 weeks on 1 week off. He will continue Daratumumab as scheduled He is reminded to take calcium with vitamin D He will get Zometa every 3 months  Anemia in chronic  renal disease This is likely due to recent treatment. The patient denies recent history of bleeding such as epistaxis, hematuria or hematochezia. He is asymptomatic from the anemia. I will observe for now.  He does not require transfusion now. I will continue the chemotherapy at current dose without dosage adjustment.  If the anemia gets progressive worse in the future, I might have to delay his treatment or adjust the chemotherapy dose.   Chronic renal insufficiency, stage III (moderate) This is chronic in nature. It is stable. Recommend observation only. We'll give him a reduced dose Zometa twice a year due to chronic renal failure He does not require dose adjustment for his chemotherapy.    No orders of the defined types were placed in this encounter.  All questions were answered. The patient knows to call the clinic with any problems, questions or concerns. No barriers to learning was detected. I spent 15 minutes counseling the patient face to face. The total time spent in the appointment was 20 minutes and more than 50% was on counseling and review of test results     Heath Lark, MD 10/16/2016 9:11 AM

## 2016-10-17 ENCOUNTER — Telehealth: Payer: Self-pay | Admitting: Hematology and Oncology

## 2016-10-17 ENCOUNTER — Encounter (HOSPITAL_COMMUNITY)
Admission: EM | Disposition: A | Discharge status: Home / Self Care | Payer: Self-pay | Source: Home / Self Care | Attending: Emergency Medicine

## 2016-10-17 ENCOUNTER — Inpatient Hospital Stay (HOSPITAL_COMMUNITY): Payer: Medicare Other | Admitting: Anesthesiology

## 2016-10-17 ENCOUNTER — Encounter (HOSPITAL_COMMUNITY): Payer: Self-pay | Admitting: Anesthesiology

## 2016-10-17 DIAGNOSIS — K920 Hematemesis: Secondary | ICD-10-CM | POA: Diagnosis not present

## 2016-10-17 DIAGNOSIS — K5710 Diverticulosis of small intestine without perforation or abscess without bleeding: Secondary | ICD-10-CM | POA: Diagnosis not present

## 2016-10-17 DIAGNOSIS — K2210 Ulcer of esophagus without bleeding: Secondary | ICD-10-CM | POA: Diagnosis not present

## 2016-10-17 DIAGNOSIS — K449 Diaphragmatic hernia without obstruction or gangrene: Secondary | ICD-10-CM | POA: Diagnosis not present

## 2016-10-17 DIAGNOSIS — D62 Acute posthemorrhagic anemia: Secondary | ICD-10-CM | POA: Diagnosis not present

## 2016-10-17 DIAGNOSIS — I129 Hypertensive chronic kidney disease with stage 1 through stage 4 chronic kidney disease, or unspecified chronic kidney disease: Secondary | ICD-10-CM | POA: Diagnosis not present

## 2016-10-17 DIAGNOSIS — K209 Esophagitis, unspecified: Secondary | ICD-10-CM | POA: Diagnosis not present

## 2016-10-17 DIAGNOSIS — K2950 Unspecified chronic gastritis without bleeding: Secondary | ICD-10-CM | POA: Diagnosis not present

## 2016-10-17 DIAGNOSIS — N179 Acute kidney failure, unspecified: Secondary | ICD-10-CM | POA: Diagnosis not present

## 2016-10-17 DIAGNOSIS — K922 Gastrointestinal hemorrhage, unspecified: Secondary | ICD-10-CM | POA: Diagnosis not present

## 2016-10-17 DIAGNOSIS — N183 Chronic kidney disease, stage 3 (moderate): Secondary | ICD-10-CM | POA: Diagnosis not present

## 2016-10-17 DIAGNOSIS — I10 Essential (primary) hypertension: Secondary | ICD-10-CM | POA: Diagnosis not present

## 2016-10-17 HISTORY — PX: ESOPHAGOGASTRODUODENOSCOPY (EGD) WITH PROPOFOL: SHX5813

## 2016-10-17 LAB — CBC
HEMATOCRIT: 20.2 % — AB (ref 39.0–52.0)
HEMATOCRIT: 20.7 % — AB (ref 39.0–52.0)
HEMATOCRIT: 24.2 % — AB (ref 39.0–52.0)
HEMOGLOBIN: 7.2 g/dL — AB (ref 13.0–17.0)
HEMOGLOBIN: 7.3 g/dL — AB (ref 13.0–17.0)
HEMOGLOBIN: 8.7 g/dL — AB (ref 13.0–17.0)
MCH: 31.3 pg (ref 26.0–34.0)
MCH: 31.4 pg (ref 26.0–34.0)
MCH: 33.5 pg (ref 26.0–34.0)
MCHC: 35.3 g/dL (ref 30.0–36.0)
MCHC: 35.6 g/dL (ref 30.0–36.0)
MCHC: 36 g/dL (ref 30.0–36.0)
MCV: 87.8 fL (ref 78.0–100.0)
MCV: 87.4 fL (ref 78.0–100.0)
MCV: 95 fL (ref 78.0–100.0)
PLATELETS: 267 10*3/uL (ref 150–400)
Platelets: 192 10*3/uL (ref 150–400)
Platelets: 163 10*3/uL (ref 150–400)
RBC: 2.18 MIL/uL — AB (ref 4.22–5.81)
RBC: 2.3 MIL/uL — AB (ref 4.22–5.81)
RBC: 2.77 MIL/uL — ABNORMAL LOW (ref 4.22–5.81)
RDW: 16.7 % — ABNORMAL HIGH (ref 11.5–15.5)
RDW: 22.3 % — ABNORMAL HIGH (ref 11.5–15.5)
RDW: 21.3 % — AB (ref 11.5–15.5)
WBC: 10.1 10*3/uL (ref 4.0–10.5)
WBC: 11.1 10*3/uL — AB (ref 4.0–10.5)
WBC: 9.9 10*3/uL (ref 4.0–10.5)

## 2016-10-17 LAB — COMPREHENSIVE METABOLIC PANEL
ALBUMIN: 2.3 g/dL — AB (ref 3.5–5.0)
ALT: 15 U/L — ABNORMAL LOW (ref 17–63)
ANION GAP: 20 — AB (ref 5–15)
AST: 28 U/L (ref 15–41)
Alkaline Phosphatase: 33 U/L — ABNORMAL LOW (ref 38–126)
BILIRUBIN TOTAL: 0.6 mg/dL (ref 0.3–1.2)
BUN: 77 mg/dL — ABNORMAL HIGH (ref 6–20)
CHLORIDE: 103 mmol/L (ref 101–111)
CO2: 15 mmol/L — ABNORMAL LOW (ref 22–32)
Calcium: 7.9 mg/dL — ABNORMAL LOW (ref 8.9–10.3)
Creatinine, Ser: 2.68 mg/dL — ABNORMAL HIGH (ref 0.61–1.24)
GFR calc Af Amer: 26 mL/min — ABNORMAL LOW (ref >60)
GFR calc non Af Amer: 22 mL/min — ABNORMAL LOW (ref >60)
GLUCOSE: 155 mg/dL — AB (ref 65–99)
POTASSIUM: 4 mmol/L (ref 3.5–5.1)
Sodium: 138 mmol/L (ref 135–145)
TOTAL PROTEIN: 4.4 g/dL — AB (ref 6.5–8.1)

## 2016-10-17 LAB — MRSA PCR SCREENING: MRSA by PCR: NEGATIVE

## 2016-10-17 LAB — KAPPA/LAMBDA LIGHT CHAINS
IG LAMBDA FREE LIGHT CHAIN: 6.4 mg/L (ref 5.7–26.3)
Ig Kappa Free Light Chain: 47.2 mg/L — ABNORMAL HIGH (ref 3.3–19.4)
Kappa/Lambda FluidC Ratio: 7.38 — ABNORMAL HIGH (ref 0.26–1.65)

## 2016-10-17 LAB — PREPARE RBC (CROSSMATCH)

## 2016-10-17 LAB — TROPONIN I: Troponin I: <0.03 ng/mL (ref <0.03)

## 2016-10-17 LAB — TSH: TSH: 7.639 u[IU]/mL — AB (ref 0.350–4.500)

## 2016-10-17 LAB — PHOSPHORUS: PHOSPHORUS: 5.4 mg/dL — AB (ref 2.5–4.6)

## 2016-10-17 LAB — HIV ANTIBODY (ROUTINE TESTING W REFLEX): HIV SCREEN 4TH GENERATION: NONREACTIVE

## 2016-10-17 LAB — MAGNESIUM: MAGNESIUM: 1 mg/dL — AB (ref 1.7–2.4)

## 2016-10-17 LAB — STREP PNEUMONIAE URINARY ANTIGEN: STREP PNEUMO URINARY ANTIGEN: NEGATIVE

## 2016-10-17 SURGERY — ESOPHAGOGASTRODUODENOSCOPY (EGD) WITH PROPOFOL
Anesthesia: Monitor Anesthesia Care | Laterality: Left

## 2016-10-17 MED ORDER — ONDANSETRON HCL 4 MG PO TABS: 4.0000 mg | ORAL_TABLET | Freq: Four times a day (QID) | ORAL | Status: DC | PRN

## 2016-10-17 MED ORDER — ONDANSETRON HCL 4 MG/2ML IJ SOLN
INTRAMUSCULAR | Status: AC
  Filled 2016-10-17: qty 2

## 2016-10-17 MED ORDER — EPHEDRINE SULFATE 50 MG/ML IJ SOLN
INTRAMUSCULAR | Status: DC | PRN
  Administered 2016-10-17 (×2): 10 mg via INTRAVENOUS

## 2016-10-17 MED ORDER — PANTOPRAZOLE SODIUM 40 MG PO TBEC
40.0000 mg | DELAYED_RELEASE_TABLET | Freq: Two times a day (BID) | ORAL | Status: DC
  Administered 2016-10-18: 40 mg via ORAL
  Filled 2016-10-17 (×2): qty 1

## 2016-10-17 MED ORDER — PROPOFOL 10 MG/ML IV BOLUS
INTRAVENOUS | Status: AC
  Filled 2016-10-17: qty 40

## 2016-10-17 MED ORDER — HYDROCODONE-ACETAMINOPHEN 5-325 MG PO TABS: 1.0000 | ORAL_TABLET | ORAL | Status: DC | PRN

## 2016-10-17 MED ORDER — LIDOCAINE 2% (20 MG/ML) 5 ML SYRINGE
INTRAMUSCULAR | Status: AC
  Filled 2016-10-17: qty 5

## 2016-10-17 MED ORDER — DORZOLAMIDE HCL-TIMOLOL MAL 2-0.5 % OP SOLN
1.0000 [drp] | Freq: Two times a day (BID) | OPHTHALMIC | Status: DC
  Administered 2016-10-17 – 2016-10-18 (×4): 1 [drp] via OPHTHALMIC
  Filled 2016-10-17: qty 10

## 2016-10-17 MED ORDER — LATANOPROST 0.005 % OP SOLN
1.0000 [drp] | Freq: Every day | OPHTHALMIC | Status: DC
  Administered 2016-10-17 (×2): 1 [drp] via OPHTHALMIC
  Filled 2016-10-17: qty 2.5

## 2016-10-17 MED ORDER — ONDANSETRON HCL 4 MG/2ML IJ SOLN
INTRAMUSCULAR | Status: DC | PRN
  Administered 2016-10-17: 4 mg via INTRAVENOUS

## 2016-10-17 MED ORDER — ALLOPURINOL 100 MG PO TABS
100.0000 mg | ORAL_TABLET | Freq: Two times a day (BID) | ORAL | Status: DC
  Administered 2016-10-17 – 2016-10-18 (×2): 100 mg via ORAL
  Filled 2016-10-17 (×2): qty 1

## 2016-10-17 MED ORDER — ONDANSETRON HCL 4 MG/2ML IJ SOLN
4.0000 mg | Freq: Four times a day (QID) | INTRAMUSCULAR | Status: DC | PRN
  Administered 2016-10-17: 4 mg via INTRAVENOUS
  Filled 2016-10-17: qty 2

## 2016-10-17 MED ORDER — SUCRALFATE 1 GM/10ML PO SUSP
1.0000 g | Freq: Two times a day (BID) | ORAL | Status: DC
  Administered 2016-10-17 – 2016-10-18 (×3): 1 g via ORAL
  Filled 2016-10-17 (×3): qty 10

## 2016-10-17 MED ORDER — SODIUM CHLORIDE 0.9% FLUSH
10.0000 mL | Freq: Two times a day (BID) | INTRAVENOUS | Status: DC
  Administered 2016-10-17: 10 mL

## 2016-10-17 MED ORDER — ATORVASTATIN CALCIUM 10 MG PO TABS
20.0000 mg | ORAL_TABLET | Freq: Every day | ORAL | Status: DC
  Administered 2016-10-17: 20 mg via ORAL
  Filled 2016-10-17: qty 2

## 2016-10-17 MED ORDER — PROPOFOL 10 MG/ML IV BOLUS
INTRAVENOUS | Status: DC | PRN
  Administered 2016-10-17 (×2): 10 mg via INTRAVENOUS

## 2016-10-17 MED ORDER — CHLORHEXIDINE GLUCONATE CLOTH 2 % EX PADS
6.0000 | MEDICATED_PAD | Freq: Every day | CUTANEOUS | Status: DC
  Administered 2016-10-17 – 2016-10-18 (×2): 6 via TOPICAL

## 2016-10-17 MED ORDER — ACETAMINOPHEN 650 MG RE SUPP: 650.0000 mg | Freq: Four times a day (QID) | RECTAL | Status: DC | PRN

## 2016-10-17 MED ORDER — SODIUM CHLORIDE 0.9% FLUSH
10.0000 mL | INTRAVENOUS | Status: DC | PRN
  Administered 2016-10-18: 10 mL
  Filled 2016-10-17: qty 40

## 2016-10-17 MED ORDER — BRIMONIDINE TARTRATE 0.15 % OP SOLN
1.0000 [drp] | Freq: Three times a day (TID) | OPHTHALMIC | Status: DC
  Administered 2016-10-17 – 2016-10-18 (×4): 1 [drp] via OPHTHALMIC
  Filled 2016-10-17: qty 5

## 2016-10-17 MED ORDER — ACETAMINOPHEN 325 MG PO TABS: 650.0000 mg | ORAL_TABLET | Freq: Four times a day (QID) | ORAL | Status: DC | PRN

## 2016-10-17 MED ORDER — SODIUM CHLORIDE 0.9 % IV SOLN
INTRAVENOUS | Status: DC | PRN
  Administered 2016-10-17: 12:00:00 via INTRAVENOUS

## 2016-10-17 MED ORDER — SODIUM CHLORIDE 0.9 % IV SOLN: INTRAVENOUS | Status: AC

## 2016-10-17 MED ORDER — ACYCLOVIR 400 MG PO TABS
400.0000 mg | ORAL_TABLET | Freq: Every day | ORAL | Status: DC
  Administered 2016-10-17 – 2016-10-18 (×2): 400 mg via ORAL
  Filled 2016-10-17 (×2): qty 1

## 2016-10-17 MED ORDER — PROPOFOL 500 MG/50ML IV EMUL
INTRAVENOUS | Status: DC | PRN
  Administered 2016-10-17: 150 ug/kg/min via INTRAVENOUS

## 2016-10-17 MED ORDER — MAGNESIUM SULFATE 4 GM/100ML IV SOLN
4.0000 g | Freq: Once | INTRAVENOUS | Status: AC
  Administered 2016-10-17: 4 g via INTRAVENOUS
  Filled 2016-10-17: qty 100

## 2016-10-17 MED ORDER — LIDOCAINE HCL (CARDIAC) 20 MG/ML IV SOLN
INTRAVENOUS | Status: DC | PRN
  Administered 2016-10-17: 60 mg via INTRATRACHEAL

## 2016-10-17 MED ORDER — FLUOROMETHOLONE 0.1 % OP SUSP
1.0000 [drp] | Freq: Every day | OPHTHALMIC | Status: DC
  Administered 2016-10-17 – 2016-10-18 (×2): 1 [drp] via OPHTHALMIC
  Filled 2016-10-17: qty 5

## 2016-10-17 MED ORDER — SODIUM CHLORIDE 0.9 % IV SOLN
Freq: Once | INTRAVENOUS | Status: AC
  Administered 2016-10-18: 02:00:00 via INTRAVENOUS

## 2016-10-17 NOTE — Brief Op Note (Signed)
10/16/2016 - 10/17/2016  12:54 PM  PATIENT:  Calvin Mccormick  71 y.o. male  PRE-OPERATIVE DIAGNOSIS:  EGD with propofol  POST-OPERATIVE DIAGNOSIS:  esophageal ulcers, severe esophagitis  PROCEDURE:  Procedure(s): ESOPHAGOGASTRODUODENOSCOPY (EGD) WITH PROPOFOL (Left)  SURGEON:  Surgeon(s) and Role:    Ronnette Juniper, MD - Primary  PHYSICIAN ASSISTANT:   ASSISTANTS: Tonna Boehringer   ANESTHESIA:   MAC  EBL:  No intake/output data recorded.  BLOOD ADMINISTERED:none  DRAINS: none   LOCAL MEDICATIONS USED:  NONE  SPECIMEN:  Biopsy / Limited Resection  DISPOSITION OF SPECIMEN:  PATHOLOGY  COUNTS:  YES  TOURNIQUET:  * No tourniquets in log *  DICTATION: .Dragon Dictation  PLAN OF CARE: Admit to inpatient   PATIENT DISPOSITION:  PACU - hemodynamically stable.   Delay start of Pharmacological VTE agent (>24hrs) due to surgical blood loss or risk of bleeding: no

## 2016-10-17 NOTE — Consult Note (Signed)
[facility] Gastroenterology Consult  Referring Provider: Drenda Freeze, MD(ER) Primary Care Physician:  Lucianne Lei, MD Primary Gastroenterologist: Dr.Buccini  Reason for Consultation:  Coffee-ground emesis, dark stools  HPI: Calvin Mccormick is a 71 y.o.African-American  male was seen by his oncologist yesterday morning and presented to the ER in the evening weakness,2 episodes of coffee-ground emesis, black stools( FOBT negative). He was hypotensive and tachycardic on admission. Patient denies use of NSAIDs but takes a baby aspirin on a regular basis. He denies difficulty swallowing, pain on swallowing, acid reflux or heartburn. He denies recent change in appetite, unintentional weight loss, abdominal pain, early satiety or bloating. No prior endoscopy. Last colonoscopy from 2013 her diarrhea, unremarkable, with normal terminal ileum and random colon biopsies. Patient has received 1 unit PRBC, and has been started on IV Protonix drip. No further episodes of coffee-ground emesis or black stools since admission.   Past Medical History:  Diagnosis Date  . Anemia 11/27/2010  . Cancer ([place])   . Chronic renal insufficiency, stage III (moderate) ([place]) 06/17/2011   Due to myeloma & HTN  . CKD (chronic kidney disease), stage III ([place]) 02/05/2011  . 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 Diarrhea 06/17/2011   Hospital admission 05/30/11 velcade toxicity? Infectious?  Marland Kitchen Hyperlipemia   . Hypertension, benign essential, goal below 140/90 11/27/2010  . Hypokalemia with normal acid-base balance 07/29/2011  . Hypomagnesemia 07/29/2011  . Seasonal allergies     Past Surgical History:  Procedure Laterality Date  . COLONOSCOPY  07/26/2011   Procedure: COLONOSCOPY;  Surgeon: Cleotis Nipper, MD;  Location: [facility] ENDOSCOPY;  Service: Endoscopy;  Laterality: N/A;  . EYE SURGERY     bilateral cataract  surgery  . MULTIPLE EXTRACTIONS WITH ALVEOLOPLASTY N/A 05/27/2014   Procedure: Extraction of tooth #'s  1,6,7,8,10,11,13,21,22,23,24,25,26,27 and 28 with alveoloplasty;  Surgeon: Lenn Cal, DDS;  Location: [facility] ORS;  Service: Oral Surgery;  Laterality: N/A;  . porta cath      for chemotherapy- right chest area    Prior to Admission medications   Medication Sig Start Date End Date Taking? Authorizing Provider  acyclovir (ZOVIRAX) 400 MG tablet Take 1 tablet (400 mg total) by mouth daily. 02/22/16  Yes Gorsuch, Ni, MD  allopurinol (ZYLOPRIM) 100 MG tablet Take 100 mg by mouth 2 (two) times daily.  12/06/12  Yes [provider]  amLODipine (NORVASC) 10 MG tablet Take 10 mg by mouth daily. 07/19/16  Yes [provider]  aspirin EC 81 MG tablet Take 81 mg by mouth daily.   Yes [provider]  atorvastatin (LIPITOR) 20 MG tablet Take 20 mg by mouth at bedtime.    Yes [provider]  brimonidine (ALPHAGAN) 0.15 % ophthalmic solution Place 1 drop into the right eye 3 (three) times daily.   Yes [provider]  cholecalciferol (VITAMIN D) 1000 UNITS tablet Take 1,000 Units by mouth daily.   Yes [provider]  dexamethasone (DECADRON) 4 MG tablet Take 5 tablets every Mondays 02/22/16  Yes Gorsuch, Ni, MD  dorzolamide-timolol (COSOPT) 22.3-6.8 MG/ML ophthalmic solution Place 1 drop into both eyes 2 (two) times daily.  07/29/13  Yes [provider]  fluorometholone (FML) 0.1 % ophthalmic suspension Place 1 drop into both eyes daily.   Yes [provider]  hydrochlorothiazide (HYDRODIURIL) 25 MG tablet Take 25 mg by mouth daily. 08/01/16  Yes [provider]  KLOR-CON M20 20 MEQ tablet Take 20 mEq by mouth daily. 09/18/16  Yes [provider]  latanoprost (XALATAN) 0.005 % ophthalmic solution Place 1 drop into the right eye at bedtime.   Yes [provider]  lidocaine-prilocaine (EMLA) cream Apply to affected area once 02/22/16  Yes Gorsuch, Ni, MD  loperamide (IMODIUM A-D) 2 MG tablet 1 po after each watery  BM.  Maximum 6 per 24hrs. 10/01/11  Yes Annia Belt, MD  ondansetron (ZOFRAN) 8 MG tablet Take 1 tablet (8 mg total) by mouth 2 (two) times daily as needed (Nausea or vomiting). 04/03/16  Yes Gorsuch, Ni, MD  pomalidomide (POMALYST) 2 MG capsule Take 1 capsule (2 mg total) by mouth daily. Take with water on days 1-21. Repeat every 28 days. 10/07/16  Yes Heath Lark, MD  prochlorperazine (COMPAZINE) 10 MG tablet Take 1 tablet (10 mg total) by mouth every 6 (six) hours as needed (Nausea or vomiting). 02/22/16  Yes Gorsuch, Ni, MD  lidocaine-prilocaine (EMLA) cream Apply 1 application topically as needed. Patient not taking: Reported on 10/16/2016 05/19/14   Heath Lark, MD    Current Facility-Administered Medications  Medication Dose Route Frequency Provider Last Rate Last Dose  . acetaminophen (TYLENOL) tablet 650 mg  650 mg Oral Q6H PRN Toy Baker, MD       Or  . acetaminophen (TYLENOL) suppository 650 mg  650 mg Rectal Q6H PRN Doutova, Anastassia, MD      . acyclovir (ZOVIRAX) tablet 400 mg  400 mg Oral Daily Doutova, Anastassia, MD      . allopurinol (ZYLOPRIM) tablet 100 mg  100 mg Oral BID Doutova, Anastassia, MD      . atorvastatin (LIPITOR) tablet 20 mg  20 mg Oral QHS Doutova, Anastassia, MD      . brimonidine (ALPHAGAN) 0.15 % ophthalmic solution 1 drop  1 drop Right Eye TID Toy Baker, MD   1 drop at 10/17/16 0923  . Chlorhexidine Gluconate Cloth 2 % PADS 6 each  6 each Topical Daily Donne Hazel, MD      . dorzolamide-timolol (COSOPT) 22.3-6.8 MG/ML ophthalmic solution 1 drop  1 drop Both Eyes BID Toy Baker, MD   1 drop at 10/17/16 0924  . fluorometholone (FML) 0.1 % ophthalmic suspension 1 drop  1 drop Both Eyes Daily Doutova, Anastassia, MD   1 drop at 10/17/16 0923  . HYDROcodone-acetaminophen (NORCO/VICODIN) 5-325 MG per tablet 1-2 tablet  1-2 tablet Oral Q4H PRN Doutova, Anastassia, MD      . latanoprost (XALATAN) 0.005 % ophthalmic solution 1  drop  1 drop Right Eye QHS Toy Baker, MD   1 drop at 10/17/16 0147  . magnesium sulfate IVPB 4 g 100 mL  4 g Intravenous Once Donne Hazel, MD 50 mL/hr at 10/17/16 1112 4 g at 10/17/16 1112  . ondansetron (ZOFRAN) tablet 4 mg  4 mg Oral Q6H PRN Doutova, Anastassia, MD       Or  . ondansetron (ZOFRAN) injection 4 mg  4 mg Intravenous Q6H PRN Doutova, Anastassia, MD   4 mg at 10/17/16 0141  . pantoprazole (PROTONIX) 80 mg in sodium chloride 0.9 % 250 mL (0.32 mg/mL) infusion  8 mg/hr Intravenous Continuous Drenda Freeze, MD 25 mL/hr at 10/17/16 0922 8 mg/hr at 10/17/16 0922  . piperacillin-tazobactam (ZOSYN) IVPB 3.375 g  3.375 g Intravenous Q8H Toy Baker, MD   Stopped at 10/17/16 1108  . sodium chloride flush (NS) 0.9 % injection 10-40 mL  10-40 mL Intracatheter Q12H Donne Hazel, MD      . sodium chloride  flush (NS) 0.9 % injection 10-40 mL  10-40 mL Intracatheter PRN Donne Hazel, MD        Allergies as of 10/16/2016 - Review Complete 10/16/2016  Allergen Reaction Noted  . Lactose intolerance (gi) Nausea And Vomiting 05/30/2011    Family History  Problem Relation Age of Onset  . Cancer Brother        lung ca    Social History   Social History  . Marital status: Married    Spouse name: N/A  . Number of children: N/A  . Years of education: N/A   Occupational History  . Not on file.   Social History Main Topics  . Smoking status: Never Smoker  . Smokeless tobacco: Never Used  . Alcohol use No  . Drug use: No  . Sexual activity: Not Currently   Other Topics Concern  . Not on file   Social History Narrative  . No narrative on file    Review of Systems: Positive for: GI: Described in detail in HPI.    Gen:  fatigue, weakness, malaise, Denies any fever, chills, rigors, night sweats, anorexia,involuntary weight loss, and sleep disorder CV: Denies chest pain, angina, palpitations, syncope, orthopnea, PND, peripheral edema, and  claudication. Resp: Denies dyspnea, cough, sputum, wheezing, coughing up blood. GU : Denies urinary burning, blood in urine, urinary frequency, urinary hesitancy, nocturnal urination, and urinary incontinence. MS: Denies joint pain or swelling.  Denies muscle weakness, cramps, atrophy.  Derm: Denies rash, itching, oral ulcerations, hives, unhealing ulcers.  Psych: Denies depression, anxiety, memory loss, suicidal ideation, hallucinations,  and confusion. Heme: Denies bruising, bleeding, and enlarged lymph nodes. Neuro:  Denies any headaches, dizziness, paresthesias. Endo:  Denies any problems with DM, thyroid, adrenal function.  Physical Exam: Vital signs in last 24 hours: Temp:  [98 F (36.7 C)-99.4 F (37.4 C)] 99.4 F (37.4 C) (10/11 0800) Pulse Rate:  [63-115] 63 (10/11 0900) Resp:  [12-30] 12 (10/11 0900) BP: (76-143)/(46-80) 120/60 (10/11 0900) SpO2:  [90 %-100 %] 100 % (10/11 0900) Weight:  [67 kg (147 lb 11.3 oz)] 67 kg (147 lb 11.3 oz) (10/11 0043) Last BM Date: 10/16/16  General:   Alert,  Well-developed, well-nourished, pleasant and cooperative in NAD Head:  Normocephalic and atraumatic. Eyes:  Sclera clear, no icterus.   Mild pallor Ears:  Normal auditory acuity. Nose:  No deformity, discharge,  or lesions. Mouth:  No deformity or lesions.  Oropharynx pink & moist. Neck:  Supple; no masses or thyromegaly. Lungs:  Clear throughout to auscultation.   No wheezes, crackles, or rhonchi. No acute distress.Port-A-Cath noted on right chest Heart:  Regular rate and rhythm; no murmurs, clicks, rubs,  or gallops. Extremities:  Without clubbing or edema. Neurologic:  Alert and  oriented x4;  grossly normal neurologically. Skin:  Intact without significant lesions or rashes. Psych:  Alert and cooperative. Normal mood and affect. Abdomen:  Soft, nontender and nondistended. No masses, hepatosplenomegaly or hernias noted. Normal bowel sounds, without guarding, and without rebound.          Lab Results:  Recent Labs  10/16/16 1948 10/16/16 2141 10/17/16 0056 10/17/16 0803  WBC 6.1  --  10.1 9.9  HGB 8.7* 7.1* 7.3* 8.7*  HCT 24.8* 21.0* 20.7* 24.2*  PLT 323  --  267 192   BMET  Recent Labs  10/16/16 0748 10/16/16 1948 10/16/16 2141 10/17/16 0056  NA 138 135 138 138  K 4.2 3.7 3.7 4.0  CL  --  99*  101 103  CO2 26 18*  --  15*  GLUCOSE 122 194* 156* 155*  BUN 53.3* 77* 69* 77*  CREATININE 1.7* 2.36* 2.30* 2.68*  CALCIUM 9.9 8.5*  --  7.9*   LFT  Recent Labs  10/17/16 0056  PROT 4.4*  ALBUMIN 2.3*  AST 28  ALT 15*  ALKPHOS 33*  BILITOT 0.6   PT/INR  Recent Labs  10/16/16 1948  LABPROT 12.9  INR 0.98    Studies/Results: Dg Chest Port 1 View  Result Date: 10/16/2016 CLINICAL DATA:  70 year old male with a history of vomiting. EXAM: PORTABLE CHEST 1 VIEW COMPARISON:  04/07/2015 FINDINGS: Cardiomediastinal silhouette unchanged in size and contour. Patchy opacities at the bilateral lung bases with mild interlobular septal thickening. No pneumothorax. No large pleural effusion. Port catheter on the right chest wall via IJ approach with the catheter appearing to terminate in the superior vena cava. No displaced fracture IMPRESSION: Ill-defined opacities at the lung bases with interlobular septal thickening suggesting early pulmonary edema, however, atypical infection not excluded. Right IJ port catheter. Electronically Signed   By: Corrie Mckusick D.O.   On: 10/16/2016 19:48    Impression: 1.Coffee-ground emesis, elevated BUN/creatinine ratio suspicious for an upper GI bleed?related to daily aspirin use/peptic ulcer disease/Mallory-Weiss tear 2. Hemoglobin dropped from 8.6 to 7.1 on admission,8.7 after 1 unit PRBC transfusion 3. Blood pressure and heart rate stable,improved hemodynamics 4.Chronic kidney disease  Plan: 1.EGD today 2.Continue IV Protonix infusion 3.Monitor H&H, transfuse if hemoglobin less than 7, monitor hemodynamics. 4.  Keep patient nothing by mouth for now.   LOS: 1 day   Ronnette Juniper, M.D. 10/17/2016, 11:28 AM  Pager [phone] If no answer or after 5 PM call [phone]

## 2016-10-17 NOTE — Progress Notes (Signed)
Pt bladder scanned and and showed 950cc in bladder. Order to in and out cath and recheck in 6 hours. In and out cath resulted in 650cc of urine.

## 2016-10-17 NOTE — Progress Notes (Signed)
PROGRESS NOTE    Calvin Mccormick  XKG:[id number] DOB: Jun 23, 1945 DOA: 10/16/2016 PCP: Lucianne Lei, MD    Brief Narrative:  71yo who presented with 24 h of coffee ground emesis x 2 episodes today. On daily aspirin. He's been feeling very weak on arrival was placed on nonrebreather. In ER was found to have dark stool per rectum although Hemoccult negative. No hx of melena prior to today initially hypotensive down to 80. Sp 2L now blood pressures improved and to 110 type and screen ordered but blood has to come from [facility] can be moment 12 hours prior to being available at this point patient's hemodynamically improving. Denies syncope. He had a fall this AM due to generalized fatigue, no full syncope. no head injury.  No chest or abdominal pain. No prior hx of GI bleed.   Assessment & Plan:   Active Problems:   Multiple myeloma in relapse ([facility])   CKD (chronic kidney disease), stage III ([facility])   HTN (hypertension)   Protein calorie malnutrition ([facility])   Acute renal failure superimposed on stage 3 chronic kidney disease ([facility])   Aspiration pneumonia ([facility])   AKI (acute kidney injury) ([place])   Upper GI bleed   1. Acute blood loss anemia 1. hgb overall stable overnight 2. Hgb now 7.3. Will transfuse 1 unit prbc's 3. Repeat cbc in AM 4. Likely secondary to severe esophagitis with ulcers as seen on EGD 2. Upper gi bleed 1. Suspect secondary to severe esophagitis and ulcers as seen on EGD 2. Recommendation to continue diet, BID PPI x 4weeks and carafate BID x 4 weeks 3. If on ASA, then daily PPI indefinitely 3. Aspiration PNA 1. Cont on zosyn 2. On minimal O2 support 4. HTN 1. BP stable at present 2. Cont monitor 5. Acute on CKD3 1. Will repeat bmet in AM  DVT prophylaxis: SCD's Code Status: Full Family Communication: Pt in room, family not at bedside Disposition Plan: Uncertain at this time  Consultants:   GI  Procedures:   EGD 10/11  Antimicrobials: Anti-infectives      Start     Dose/Rate Route Frequency Ordered Stop   10/17/16 1000  acyclovir (ZOVIRAX) tablet 400 mg     400 mg Oral Daily 10/17/16 0049     10/17/16 0600  piperacillin-tazobactam (ZOSYN) IVPB 3.375 g     3.375 g 12.5 mL/hr over 240 Minutes Intravenous Every 8 hours 10/16/16 2213     10/16/16 2215  piperacillin-tazobactam (ZOSYN) IVPB 3.375 g     3.375 g 100 mL/hr over 30 Minutes Intravenous  Once 10/16/16 2213 10/16/16 2340       Subjective: Without complaints at this time  Objective: Vitals:   10/17/16 1320 10/17/16 1330 10/17/16 1340 10/17/16 1600  BP: (!) 106/56 (!) 112/58 118/61   Pulse: 67 60 60   Resp: _0 Temp:    (!) 97.5 F (36.4 C)  TempSrc:    Oral  SpO2: 99% 100% 99%   Weight:      Height:        Intake/Output Summary (Last 24 hours) at 10/17/16 1748 Last data filed at 10/17/16 1304  Gross per 24 hour  Intake          1044.09 ml  Output                0 ml  Net          1044.09 ml   Filed Weights   10/17/16 0043  Weight: 67 kg (147 lb 11.3 oz)    Examination:  General exam: Appears calm and comfortable  Respiratory system: Clear to auscultation. Respiratory effort normal. Cardiovascular system: S1 & S2 heard, RRR Gastrointestinal system: Abdomen is nondistended, soft and nontender. No organomegaly or masses felt. Normal bowel sounds heard. Central nervous system: Alert and oriented. No focal neurological deficits. Extremities: Symmetric 5 x 5 power. Skin: No rashes, lesions Psychiatry: Judgement and insight appear normal. Mood & affect appropriate.   Data Reviewed: I have personally reviewed following labs and imaging studies  CBC:  Recent Labs Lab 10/16/16 0747 10/16/16 1948 10/16/16 2141 10/17/16 0056 10/17/16 0803 10/17/16 1522  WBC 10.0 6.1  --  10.1 9.9 11.1*  NEUTROABS 7.5* 4.8  --   --   --   --   HGB 8.6* 8.7* 7.1* 7.3* 8.7* 7.2*  HCT 25.8* 24.8* 21.0* 20.7* 24.2* 20.2*  MCV 95.9 95.0  --  95.0 87.4 87.8  PLT 245 323   --  267 192 619   Basic Metabolic Panel:  Recent Labs Lab 10/16/16 0748 10/16/16 1948 10/16/16 2141 10/17/16 0056  NA 138 135 138 138  K 4.2 3.7 3.7 4.0  CL  --  99* 101 103  CO2 26 18*  --  15*  GLUCOSE 122 194* 156* 155*  BUN 53.3* 77* 69* 77*  CREATININE 1.7* 2.36* 2.30* 2.68*  CALCIUM 9.9 8.5*  --  7.9*  MG  --   --   --  1.0*  PHOS  --   --   --  5.4*   GFR: Estimated Creatinine Clearance: 23.3 mL/min (A) (by C-G formula based on SCr of 2.68 mg/dL (H)). Liver Function Tests:  Recent Labs Lab 10/16/16 0748 10/16/16 1948 10/17/16 0056  AST _0 ALT 13 14* 15*  ALKPHOS 56 48 33*  BILITOT 0.36 0.4 0.6  PROT 6.1* 5.4* 4.4*  ALBUMIN 3.0* 2.7* 2.3*    Recent Labs Lab 10/16/16 1948  LIPASE 25   No results for input(s): AMMONIA in the last 168 hours. Coagulation Profile:  Recent Labs Lab 10/16/16 1948  INR 0.98   Cardiac Enzymes:  Recent Labs Lab 10/17/16 0056 10/17/16 0550  TROPONINI <0.03 <0.03   BNP (last 3 results) No results for input(s): PROBNP in the last 8760 hours. HbA1C: No results for input(s): HGBA1C in the last 72 hours. CBG: No results for input(s): GLUCAP in the last 168 hours. Lipid Profile: No results for input(s): CHOL, HDL, LDLCALC, TRIG, CHOLHDL, LDLDIRECT in the last 72 hours. Thyroid Function Tests:  Recent Labs  10/17/16 0056  TSH 7.639*   Anemia Panel: No results for input(s): VITAMINB12, FOLATE, FERRITIN, TIBC, IRON, RETICCTPCT in the last 72 hours. Sepsis Labs: No results for input(s): PROCALCITON, LATICACIDVEN in the last 168 hours.  Recent Results (from the past 240 hour(s))  MRSA PCR Screening     Status: None   Collection Time: 10/17/16 12:53 AM  Result Value Ref Range Status   MRSA by PCR NEGATIVE NEGATIVE Final    Comment:        The GeneXpert MRSA Assay (FDA approved for NASAL specimens only), is one component of a comprehensive MRSA colonization surveillance program. It is not intended to  diagnose MRSA infection nor to guide or monitor treatment for MRSA infections.      Radiology Studies: Dg Chest Port 1 View  Result Date: 10/16/2016 CLINICAL DATA:  71 year old male with a history of vomiting. EXAM: PORTABLE CHEST 1 VIEW  COMPARISON:  04/07/2015 FINDINGS: Cardiomediastinal silhouette unchanged in size and contour. Patchy opacities at the bilateral lung bases with mild interlobular septal thickening. No pneumothorax. No large pleural effusion. Port catheter on the right chest wall via IJ approach with the catheter appearing to terminate in the superior vena cava. No displaced fracture IMPRESSION: Ill-defined opacities at the lung bases with interlobular septal thickening suggesting early pulmonary edema, however, atypical infection not excluded. Right IJ port catheter. Electronically Signed   By: Corrie Mckusick D.O.   On: 10/16/2016 19:48    Scheduled Meds: . acyclovir  400 mg Oral Daily  . allopurinol  100 mg Oral BID  . atorvastatin  20 mg Oral QHS  . brimonidine  1 drop Right Eye TID  . Chlorhexidine Gluconate Cloth  6 each Topical Daily  . dorzolamide-timolol  1 drop Both Eyes BID  . fluorometholone  1 drop Both Eyes Daily  . latanoprost  1 drop Right Eye QHS  . pantoprazole  40 mg Oral BID AC  . sodium chloride flush  10-40 mL Intracatheter Q12H  . sucralfate  1 g Oral BID   Continuous Infusions: . sodium chloride    . piperacillin-tazobactam (ZOSYN)  IV Stopped (10/17/16 1108)     LOS: 1 day   Aahil Fredin, Orpah Melter, MD [facility] Pager [phone]  If 7PM-7AM, please contact night-coverage [email or web address] Password [email or web address] 10/17/2016, 5:48 PM

## 2016-10-17 NOTE — Anesthesia Postprocedure Evaluation (Signed)
Anesthesia Post Note  Patient: Calvin Mccormick  Procedure(s) Performed: ESOPHAGOGASTRODUODENOSCOPY (EGD) WITH PROPOFOL (Left )     Anesthesia Type: MAC Level of consciousness: awake, oriented and awake and alert Pain management: pain level controlled Vital Signs Assessment: post-procedure vital signs reviewed and stable Respiratory status: spontaneous breathing, nonlabored ventilation and respiratory function stable Cardiovascular status: blood pressure returned to baseline Postop Assessment: no headache Anesthetic complications: no    Last Vitals:  Vitals:   10/17/16 1330 10/17/16 1340  BP: (!) 112/58 118/61  Pulse: 60 60  Resp: 11 11  Temp:    SpO2: 100% 99%    Last Pain:  Vitals:   10/17/16 1303  TempSrc: Oral  PainSc:                  [name] COKER

## 2016-10-17 NOTE — Op Note (Signed)
EGD was performed for coffee-ground emesis. Multiple linear superficial ulcers were noted in the esophagus from 25-35 cm from insertion. Severe esophagitis was noted from 30-35 cm from insertion. Multiple biopsies were obtained. A small hiatal hernia was noted. The gastric cavity contained coffee-ground fluid, after suctioning and lavage, no obvious gastric lesions/erosion/ulceration/erythema was noted. Biopsies were taken from the antrum to rule out H. Pylori. Duodenum bulb appeared unremarkable. A small diverticulum was noted in second portion of the duodenum.  Recommendations: Start regular diet. PPI twice a day for 4 weeks. Sucralfate 1 g by mouth twice a day for 2 weeks. If patient needs aspirin, recommend PPI indefinitely. Treat H. Pylori if found, we will follow up pathology as an outpatient.  Please recall GI if needed.  Ronnette Juniper, M.D.

## 2016-10-17 NOTE — Op Note (Signed)
[facility] Patient Name: Calvin Mccormick Procedure Date: 10/17/2016 MRN: [id number] Attending MD: Ronnette Juniper , MD Date of Birth: 1945-06-30 CSN: [id number] Age: 71 Admit Type: Inpatient Procedure:                Upper GI endoscopy Indications:              Coffee-ground emesis Providers:                Ronnette Juniper, MD, Vista Lawman, RN, Tinnie Gens,                            Technician, Virgia Land, CRNA Referring MD:              Medicines:                Monitored Anesthesia Care Complications:            No immediate complications. Estimated Blood Loss:     Estimated blood loss: none. Procedure:                Pre-Anesthesia Assessment:                           - Prior to the procedure, a History and Physical                            was performed, and patient medications and                            allergies were reviewed. The patient's tolerance of                            previous anesthesia was also reviewed. The risks                            and benefits of the procedure and the sedation                            options and risks were discussed with the patient.                            All questions were answered, and informed consent                            was obtained. Prior Anticoagulants: The patient has                            taken aspirin, last dose was 1 day prior to                            procedure. ASA Grade Assessment: III - A patient                            with severe systemic disease. After reviewing the  risks and benefits, the patient was deemed in                            satisfactory condition to undergo the procedure.                           After obtaining informed consent, the endoscope was                            passed under direct vision. Throughout the                            procedure, the patient's blood pressure, pulse, and                            oxygen saturations  were monitored continuously. The                            EG-[id number] ([id number]) scope was introduced through the                            mouth, and advanced to the second part of duodenum.                            The upper GI endoscopy was accomplished without                            difficulty. The patient tolerated the procedure                            well. Scope In: Scope Out: Findings:      Five linear and superficial esophageal ulcers with no bleeding and no       stigmata of recent bleeding were found 25 to 35 cm from the incisors.       Biopsies were taken with a cold forceps for histology.      LA Grade D (one or more mucosal breaks involving at least 75% of       esophageal circumference) esophagitis with no bleeding was found 30 to       35 cm from the incisors.      A 3 cm hiatal hernia was present.      Coffee groud colored fluid was found in the gastric body.      The exam of the stomach was otherwise normal.      Biopsies were taken with a cold forceps in the gastric antrum for       Helicobacter pylori testing.      The cardia and gastric fundus were normal on retroflexion.      The examined duodenum was normal.      A small non-bleeding diverticulum was found in the second portion of the       duodenum. Impression:               - Non-bleeding esophageal ulcers. Biopsied.                           -  LA Grade D esophagitis.                           - 3 cm hiatal hernia.                           - Coffee groud colored gastric fluid.                           - Normal examined duodenum.                           - Non-bleeding duodenal diverticulum.                           - Biopsies were taken with a cold forceps for                            Helicobacter pylori testing. Moderate Sedation:      Patient did not receive moderate sedation for this procedure, but       instead received monitored anesthesia care. Recommendation:           - Resume regular  diet.                           - Continue present medications.                           - Await pathology results.                           - Use Protonix (pantoprazole) 40 mg PO BID for 4                            weeks.                           - Use sucralfate suspension 1 gram PO BID for 2                            weeks.                           - If patient needs to be on ASA, recommend PPI use                            indefinitely.                           - Return patient to hospital ward for ongoing care. Procedure Code(s):        --- Professional ---                           [phone], Esophagogastroduodenoscopy, flexible,                            transoral; with biopsy, single or multiple Diagnosis Code(s):        ---  Professional ---                           K22.10, Ulcer of esophagus without bleeding                           K20.9, Esophagitis, unspecified                           K44.9, Diaphragmatic hernia without obstruction or                            gangrene                           K92.0, Hematemesis                           K57.10, Diverticulosis of small intestine without                            perforation or abscess without bleeding CPT copyright 2016 American [employer] Association. All rights reserved. The codes documented in this report are preliminary and upon coder review may  be revised to meet current compliance requirements. Ronnette Juniper, MD 10/17/2016 1:00:52 PM This report has been signed electronically. Number of Addenda: 0

## 2016-10-17 NOTE — Progress Notes (Signed)
Pharmacy Antibiotic Note  Calvin Mccormick is a 71 y.o. male admitted on 10/16/2016 with pneumonia.  Pharmacy has been consulted for zosyn dosing.  Plan: Zosyn 3.375g IV q8h (4 hour infusion).  Height: 5\' 11"  (180.3 cm) Weight: 147 lb 11.3 oz (67 kg) IBW/kg (Calculated) : 75.3  Temp (24hrs), Avg:98.2 F (36.8 C), Min:97.9 F (36.6 C), Max:99.1 F (37.3 C)   Recent Labs Lab 10/16/16 0747 10/16/16 0748 10/16/16 1948 10/16/16 2141 10/17/16 0056  WBC 10.0  --  6.1  --  10.1  CREATININE  --  1.7* 2.36* 2.30* 2.68*    Estimated Creatinine Clearance: 23.3 mL/min (A) (by C-G formula based on SCr of 2.68 mg/dL (H)).    Allergies  Allergen Reactions  . Lactose Intolerance (Gi) Nausea And Vomiting    Pt said he is not ??    Antimicrobials this admission: Zosyn 10/16/2016 >>   Dose adjustments this admission: -  Microbiology results: pending  Thank you for allowing pharmacy to be a part of this patient's care.  Nani Skillern Crowford 10/17/2016 4:55 AM

## 2016-10-17 NOTE — Transfer of Care (Signed)
Immediate Anesthesia Transfer of Care Note  Patient: Calvin Mccormick  Procedure(s) Performed: Procedure(s): ESOPHAGOGASTRODUODENOSCOPY (EGD) WITH PROPOFOL (Left)  Patient Location: PACU  Anesthesia Type:MAC  Level of Consciousness:  sedated, patient cooperative and responds to stimulation  Airway & Oxygen Therapy:Patient Spontanous Breathing and Patient connected to face mask oxgen  Post-op Assessment:  Report given to PACU RN and Post -op Vital signs reviewed and stable  Post vital signs:  Reviewed and stable  Last Vitals:  Vitals:   10/17/16 0900 10/17/16 1145  BP: 120/60 (!) 114/55  Pulse: 63 65  Resp: 12 18  Temp:  36.7 C  SpO2: 188% 41%    Complications: No apparent anesthesia complications

## 2016-10-17 NOTE — Anesthesia Preprocedure Evaluation (Addendum)
Anesthesia Evaluation  Patient identified by MRN, date of birth, ID band Patient awake    Reviewed: Allergy & Precautions, H&P , NPO status , Patient's Chart, lab work & pertinent test results  Airway Mallampati: II  TM Distance: >3 FB Neck ROM: full    Dental no notable dental hx. (+) Edentulous Lower, Edentulous Upper   Pulmonary neg pulmonary ROS,    Pulmonary exam normal breath sounds clear to auscultation       Cardiovascular Exercise Tolerance: Good hypertension, Pt. on medications Normal cardiovascular exam Rhythm:regular Rate:Normal  ECG: SR, rate 74   Neuro/Psych Peripheral neuropathy negative neurological ROS  negative psych ROS   GI/Hepatic negative GI ROS, Neg liver ROS,   Endo/Other  negative endocrine ROS  Renal/GU Renal diseaseStage 3 chronic renal disease     Musculoskeletal   Abdominal   Peds  Hematology  (+) anemia , IgG monoclonal gammopathy   Anesthesia Other Findings Multiple myeloma  Reproductive/Obstetrics                           Anesthesia Physical  Anesthesia Plan  ASA: III  Anesthesia Plan: MAC   Post-op Pain Management:    Induction: Intravenous  PONV Risk Score and Plan: 1 and Propofol infusion and Treatment may vary due to age or medical condition  Airway Management Planned: Natural Airway  Additional Equipment:   Intra-op Plan:   Post-operative Plan:   Informed Consent: I have reviewed the patients History and Physical, chart, labs and discussed the procedure including the risks, benefits and alternatives for the proposed anesthesia with the patient or authorized representative who has indicated his/her understanding and acceptance.     Plan Discussed with: CRNA  Anesthesia Plan Comments:        Anesthesia Quick Evaluation

## 2016-10-17 NOTE — Care Management Note (Signed)
Case Management Note  Patient Details  Name: Calvin Mccormick MRN: [id number] Date of Birth: [birth year]  Subjective/Objective:                  pna  Action/Plan: Date:  October 17, 2016 Chart reviewed for concurrent status and case management needs.  Will continue to follow patient progress.  Discharge Planning: following for needs  Expected discharge date: [id number]  Velva Harman, BSN, [place], [place]   Expected Discharge Date:  10/19/16               Expected Discharge Plan:  Home/Self Care  In-House Referral:     Discharge planning Services  CM Consult  Post Acute Care Choice:    Choice offered to:     DME Arranged:    DME Agency:     HH Arranged:    HH Agency:     Status of Service:  In process, will continue to follow  If discussed at Long Length of Stay Meetings, dates discussed:    Additional Comments:  Leeroy Cha, RN 10/17/2016, 8:56 AM

## 2016-10-17 NOTE — H&P (View-Only) (Signed)
[facility] Gastroenterology Consult  Referring Provider: Drenda Freeze, MD(ER) Primary Care Physician:  Lucianne Lei, MD Primary Gastroenterologist: Dr.Buccini  Reason for Consultation:  Coffee-ground emesis, dark stools  HPI: Calvin Mccormick is a 71 y.o.African-American  male was seen by his oncologist yesterday morning and presented to the ER in the evening weakness,2 episodes of coffee-ground emesis, black stools( FOBT negative). He was hypotensive and tachycardic on admission. Patient denies use of NSAIDs but takes a baby aspirin on a regular basis. He denies difficulty swallowing, pain on swallowing, acid reflux or heartburn. He denies recent change in appetite, unintentional weight loss, abdominal pain, early satiety or bloating. No prior endoscopy. Last colonoscopy from 2013 her diarrhea, unremarkable, with normal terminal ileum and random colon biopsies. Patient has received 1 unit PRBC, and has been started on IV Protonix drip. No further episodes of coffee-ground emesis or black stools since admission.   Past Medical History:  Diagnosis Date  . Anemia 11/27/2010  . Cancer ([place])   . Chronic renal insufficiency, stage III (moderate) ([place]) 06/17/2011   Due to myeloma & HTN  . CKD (chronic kidney disease), stage III ([place]) 02/05/2011  . 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 Diarrhea 06/17/2011   Hospital admission 05/30/11 velcade toxicity? Infectious?  Marland Kitchen Hyperlipemia   . Hypertension, benign essential, goal below 140/90 11/27/2010  . Hypokalemia with normal acid-base balance 07/29/2011  . Hypomagnesemia 07/29/2011  . Seasonal allergies     Past Surgical History:  Procedure Laterality Date  . COLONOSCOPY  07/26/2011   Procedure: COLONOSCOPY;  Surgeon: Cleotis Nipper, MD;  Location: [facility] ENDOSCOPY;  Service: Endoscopy;  Laterality: N/A;  . EYE SURGERY     bilateral cataract  surgery  . MULTIPLE EXTRACTIONS WITH ALVEOLOPLASTY N/A 05/27/2014   Procedure: Extraction of tooth #'s  1,6,7,8,10,11,13,21,22,23,24,25,26,27 and 28 with alveoloplasty;  Surgeon: Lenn Cal, DDS;  Location: [facility] ORS;  Service: Oral Surgery;  Laterality: N/A;  . porta cath      for chemotherapy- right chest area    Prior to Admission medications   Medication Sig Start Date End Date Taking? Authorizing Provider  acyclovir (ZOVIRAX) 400 MG tablet Take 1 tablet (400 mg total) by mouth daily. 02/22/16  Yes Gorsuch, Ni, MD  allopurinol (ZYLOPRIM) 100 MG tablet Take 100 mg by mouth 2 (two) times daily.  12/06/12  Yes [provider]  amLODipine (NORVASC) 10 MG tablet Take 10 mg by mouth daily. 07/19/16  Yes [provider]  aspirin EC 81 MG tablet Take 81 mg by mouth daily.   Yes [provider]  atorvastatin (LIPITOR) 20 MG tablet Take 20 mg by mouth at bedtime.    Yes [provider]  brimonidine (ALPHAGAN) 0.15 % ophthalmic solution Place 1 drop into the right eye 3 (three) times daily.   Yes [provider]  cholecalciferol (VITAMIN D) 1000 UNITS tablet Take 1,000 Units by mouth daily.   Yes [provider]  dexamethasone (DECADRON) 4 MG tablet Take 5 tablets every Mondays 02/22/16  Yes Gorsuch, Ni, MD  dorzolamide-timolol (COSOPT) 22.3-6.8 MG/ML ophthalmic solution Place 1 drop into both eyes 2 (two) times daily.  07/29/13  Yes [provider]  fluorometholone (FML) 0.1 % ophthalmic suspension Place 1 drop into both eyes daily.   Yes [provider]  hydrochlorothiazide (HYDRODIURIL) 25 MG tablet Take 25 mg by mouth daily. 08/01/16  Yes [provider]  KLOR-CON M20 20 MEQ tablet Take 20 mEq by mouth daily. 09/18/16  Yes [provider]  latanoprost (XALATAN) 0.005 % ophthalmic solution Place 1 drop into the right eye at bedtime.   Yes [provider]  lidocaine-prilocaine (EMLA) cream Apply to affected area once 02/22/16  Yes Gorsuch, Ni, MD  loperamide (IMODIUM A-D) 2 MG tablet 1 po after each watery  BM.  Maximum 6 per 24hrs. 10/01/11  Yes Annia Belt, MD  ondansetron (ZOFRAN) 8 MG tablet Take 1 tablet (8 mg total) by mouth 2 (two) times daily as needed (Nausea or vomiting). 04/03/16  Yes Gorsuch, Ni, MD  pomalidomide (POMALYST) 2 MG capsule Take 1 capsule (2 mg total) by mouth daily. Take with water on days 1-21. Repeat every 28 days. 10/07/16  Yes Heath Lark, MD  prochlorperazine (COMPAZINE) 10 MG tablet Take 1 tablet (10 mg total) by mouth every 6 (six) hours as needed (Nausea or vomiting). 02/22/16  Yes Gorsuch, Ni, MD  lidocaine-prilocaine (EMLA) cream Apply 1 application topically as needed. Patient not taking: Reported on 10/16/2016 05/19/14   Heath Lark, MD    Current Facility-Administered Medications  Medication Dose Route Frequency Provider Last Rate Last Dose  . acetaminophen (TYLENOL) tablet 650 mg  650 mg Oral Q6H PRN Toy Baker, MD       Or  . acetaminophen (TYLENOL) suppository 650 mg  650 mg Rectal Q6H PRN Doutova, Anastassia, MD      . acyclovir (ZOVIRAX) tablet 400 mg  400 mg Oral Daily Doutova, Anastassia, MD      . allopurinol (ZYLOPRIM) tablet 100 mg  100 mg Oral BID Doutova, Anastassia, MD      . atorvastatin (LIPITOR) tablet 20 mg  20 mg Oral QHS Doutova, Anastassia, MD      . brimonidine (ALPHAGAN) 0.15 % ophthalmic solution 1 drop  1 drop Right Eye TID Toy Baker, MD   1 drop at 10/17/16 0923  . Chlorhexidine Gluconate Cloth 2 % PADS 6 each  6 each Topical Daily Donne Hazel, MD      . dorzolamide-timolol (COSOPT) 22.3-6.8 MG/ML ophthalmic solution 1 drop  1 drop Both Eyes BID Toy Baker, MD   1 drop at 10/17/16 0924  . fluorometholone (FML) 0.1 % ophthalmic suspension 1 drop  1 drop Both Eyes Daily Doutova, Anastassia, MD   1 drop at 10/17/16 0923  . HYDROcodone-acetaminophen (NORCO/VICODIN) 5-325 MG per tablet 1-2 tablet  1-2 tablet Oral Q4H PRN Doutova, Anastassia, MD      . latanoprost (XALATAN) 0.005 % ophthalmic solution 1  drop  1 drop Right Eye QHS Toy Baker, MD   1 drop at 10/17/16 0147  . magnesium sulfate IVPB 4 g 100 mL  4 g Intravenous Once Donne Hazel, MD 50 mL/hr at 10/17/16 1112 4 g at 10/17/16 1112  . ondansetron (ZOFRAN) tablet 4 mg  4 mg Oral Q6H PRN Doutova, Anastassia, MD       Or  . ondansetron (ZOFRAN) injection 4 mg  4 mg Intravenous Q6H PRN Doutova, Anastassia, MD   4 mg at 10/17/16 0141  . pantoprazole (PROTONIX) 80 mg in sodium chloride 0.9 % 250 mL (0.32 mg/mL) infusion  8 mg/hr Intravenous Continuous Drenda Freeze, MD 25 mL/hr at 10/17/16 0922 8 mg/hr at 10/17/16 0922  . piperacillin-tazobactam (ZOSYN) IVPB 3.375 g  3.375 g Intravenous Q8H Toy Baker, MD   Stopped at 10/17/16 1108  . sodium chloride flush (NS) 0.9 % injection 10-40 mL  10-40 mL Intracatheter Q12H Donne Hazel, MD      . sodium chloride  flush (NS) 0.9 % injection 10-40 mL  10-40 mL Intracatheter PRN Donne Hazel, MD        Allergies as of 10/16/2016 - Review Complete 10/16/2016  Allergen Reaction Noted  . Lactose intolerance (gi) Nausea And Vomiting 05/30/2011    Family History  Problem Relation Age of Onset  . Cancer Brother        lung ca    Social History   Social History  . Marital status: Married    Spouse name: N/A  . Number of children: N/A  . Years of education: N/A   Occupational History  . Not on file.   Social History Main Topics  . Smoking status: Never Smoker  . Smokeless tobacco: Never Used  . Alcohol use No  . Drug use: No  . Sexual activity: Not Currently   Other Topics Concern  . Not on file   Social History Narrative  . No narrative on file    Review of Systems: Positive for: GI: Described in detail in HPI.    Gen:  fatigue, weakness, malaise, Denies any fever, chills, rigors, night sweats, anorexia,involuntary weight loss, and sleep disorder CV: Denies chest pain, angina, palpitations, syncope, orthopnea, PND, peripheral edema, and  claudication. Resp: Denies dyspnea, cough, sputum, wheezing, coughing up blood. GU : Denies urinary burning, blood in urine, urinary frequency, urinary hesitancy, nocturnal urination, and urinary incontinence. MS: Denies joint pain or swelling.  Denies muscle weakness, cramps, atrophy.  Derm: Denies rash, itching, oral ulcerations, hives, unhealing ulcers.  Psych: Denies depression, anxiety, memory loss, suicidal ideation, hallucinations,  and confusion. Heme: Denies bruising, bleeding, and enlarged lymph nodes. Neuro:  Denies any headaches, dizziness, paresthesias. Endo:  Denies any problems with DM, thyroid, adrenal function.  Physical Exam: Vital signs in last 24 hours: Temp:  [98 F (36.7 C)-99.4 F (37.4 C)] 99.4 F (37.4 C) (10/11 0800) Pulse Rate:  [63-115] 63 (10/11 0900) Resp:  [12-30] 12 (10/11 0900) BP: (76-143)/(46-80) 120/60 (10/11 0900) SpO2:  [90 %-100 %] 100 % (10/11 0900) Weight:  [67 kg (147 lb 11.3 oz)] 67 kg (147 lb 11.3 oz) (10/11 0043) Last BM Date: 10/16/16  General:   Alert,  Well-developed, well-nourished, pleasant and cooperative in NAD Head:  Normocephalic and atraumatic. Eyes:  Sclera clear, no icterus.   Mild pallor Ears:  Normal auditory acuity. Nose:  No deformity, discharge,  or lesions. Mouth:  No deformity or lesions.  Oropharynx pink & moist. Neck:  Supple; no masses or thyromegaly. Lungs:  Clear throughout to auscultation.   No wheezes, crackles, or rhonchi. No acute distress.Port-A-Cath noted on right chest Heart:  Regular rate and rhythm; no murmurs, clicks, rubs,  or gallops. Extremities:  Without clubbing or edema. Neurologic:  Alert and  oriented x4;  grossly normal neurologically. Skin:  Intact without significant lesions or rashes. Psych:  Alert and cooperative. Normal mood and affect. Abdomen:  Soft, nontender and nondistended. No masses, hepatosplenomegaly or hernias noted. Normal bowel sounds, without guarding, and without rebound.          Lab Results:  Recent Labs  10/16/16 1948 10/16/16 2141 10/17/16 0056 10/17/16 0803  WBC 6.1  --  10.1 9.9  HGB 8.7* 7.1* 7.3* 8.7*  HCT 24.8* 21.0* 20.7* 24.2*  PLT 323  --  267 192   BMET  Recent Labs  10/16/16 0748 10/16/16 1948 10/16/16 2141 10/17/16 0056  NA 138 135 138 138  K 4.2 3.7 3.7 4.0  CL  --  99*  101 103  CO2 26 18*  --  15*  GLUCOSE 122 194* 156* 155*  BUN 53.3* 77* 69* 77*  CREATININE 1.7* 2.36* 2.30* 2.68*  CALCIUM 9.9 8.5*  --  7.9*   LFT  Recent Labs  10/17/16 0056  PROT 4.4*  ALBUMIN 2.3*  AST 28  ALT 15*  ALKPHOS 33*  BILITOT 0.6   PT/INR  Recent Labs  10/16/16 1948  LABPROT 12.9  INR 0.98    Studies/Results: Dg Chest Port 1 View  Result Date: 10/16/2016 CLINICAL DATA:  71 year old male with a history of vomiting. EXAM: PORTABLE CHEST 1 VIEW COMPARISON:  04/07/2015 FINDINGS: Cardiomediastinal silhouette unchanged in size and contour. Patchy opacities at the bilateral lung bases with mild interlobular septal thickening. No pneumothorax. No large pleural effusion. Port catheter on the right chest wall via IJ approach with the catheter appearing to terminate in the superior vena cava. No displaced fracture IMPRESSION: Ill-defined opacities at the lung bases with interlobular septal thickening suggesting early pulmonary edema, however, atypical infection not excluded. Right IJ port catheter. Electronically Signed   By: Corrie Mckusick D.O.   On: 10/16/2016 19:48    Impression: 1.Coffee-ground emesis, elevated BUN/creatinine ratio suspicious for an upper GI bleed?related to daily aspirin use/peptic ulcer disease/Mallory-Weiss tear 2. Hemoglobin dropped from 8.6 to 7.1 on admission,8.7 after 1 unit PRBC transfusion 3. Blood pressure and heart rate stable,improved hemodynamics 4.Chronic kidney disease  Plan: 1.EGD today 2.Continue IV Protonix infusion 3.Monitor H&H, transfuse if hemoglobin less than 7, monitor hemodynamics. 4.  Keep patient nothing by mouth for now.   LOS: 1 day   Ronnette Juniper, M.D. 10/17/2016, 11:28 AM  Pager [phone] If no answer or after 5 PM call [phone]

## 2016-10-17 NOTE — Interval H&P Note (Signed)
History and Physical Interval Note: 71 year old male with coffee-ground emesis and dark stools, aspirin 81 mg and a regular basis,suspicious for an upper GI bleed.  10/17/2016 11:39 AM  Calvin Mccormick  has presented today for EGD, with the diagnosis of coffee ground emesis,black stool and anemia requiring transfusion.  The various methods of treatment have been discussed with the patient and family. After consideration of risks, benefits and other options for treatment, the patient has consented to  Procedure(s): ESOPHAGOGASTRODUODENOSCOPY (EGD) WITH PROPOFOL (Left) as a surgical intervention .  The patient's history has been reviewed, patient examined, no change in status, stable for surgery.  I have reviewed the patient's chart and labs.  Questions were answered to the patient's satisfaction.     Ronnette Juniper

## 2016-10-17 NOTE — Telephone Encounter (Signed)
Per 10/10 los - Return for scheduling msg sent.

## 2016-10-18 ENCOUNTER — Encounter (HOSPITAL_COMMUNITY): Payer: Self-pay | Admitting: Gastroenterology

## 2016-10-18 DIAGNOSIS — D62 Acute posthemorrhagic anemia: Secondary | ICD-10-CM

## 2016-10-18 DIAGNOSIS — N179 Acute kidney failure, unspecified: Secondary | ICD-10-CM | POA: Diagnosis not present

## 2016-10-18 DIAGNOSIS — K922 Gastrointestinal hemorrhage, unspecified: Secondary | ICD-10-CM | POA: Diagnosis not present

## 2016-10-18 DIAGNOSIS — N183 Chronic kidney disease, stage 3 (moderate): Secondary | ICD-10-CM | POA: Diagnosis not present

## 2016-10-18 LAB — CBC
HEMATOCRIT: 23.2 % — AB (ref 39.0–52.0)
Hemoglobin: 8.1 g/dL — ABNORMAL LOW (ref 13.0–17.0)
MCH: 30.2 pg (ref 26.0–34.0)
MCHC: 34.9 g/dL (ref 30.0–36.0)
MCV: 86.6 fL (ref 78.0–100.0)
Platelets: 137 10*3/uL — ABNORMAL LOW (ref 150–400)
RBC: 2.68 MIL/uL — ABNORMAL LOW (ref 4.22–5.81)
RDW: 20.1 % — AB (ref 11.5–15.5)
WBC: 8.2 10*3/uL (ref 4.0–10.5)

## 2016-10-18 LAB — BASIC METABOLIC PANEL
Anion gap: 9 (ref 5–15)
BUN: 68 mg/dL — AB (ref 6–20)
CO2: 22 mmol/L (ref 22–32)
Calcium: 7.1 mg/dL — ABNORMAL LOW (ref 8.9–10.3)
Chloride: 106 mmol/L (ref 101–111)
Creatinine, Ser: 2.24 mg/dL — ABNORMAL HIGH (ref 0.61–1.24)
GFR calc Af Amer: 32 mL/min — ABNORMAL LOW (ref >60)
GFR, EST NON AFRICAN AMERICAN: 27 mL/min — AB (ref >60)
GLUCOSE: 98 mg/dL (ref 65–99)
POTASSIUM: 3.5 mmol/L (ref 3.5–5.1)
Sodium: 137 mmol/L (ref 135–145)

## 2016-10-18 MED ORDER — AMOXICILLIN-POT CLAVULANATE 875-125 MG PO TABS: 1.0000 | ORAL_TABLET | Freq: Two times a day (BID) | ORAL | 0 refills | Status: AC

## 2016-10-18 MED ORDER — SODIUM CHLORIDE 0.9 % IV SOLN: Freq: Once | INTRAVENOUS | Status: DC

## 2016-10-18 MED ORDER — HEPARIN SOD (PORK) LOCK FLUSH 100 UNIT/ML IV SOLN
500.0000 [IU] | INTRAVENOUS | Status: AC | PRN
  Administered 2016-10-18: 500 [IU]

## 2016-10-18 MED ORDER — PANTOPRAZOLE SODIUM 40 MG PO TBEC: 40.0000 mg | DELAYED_RELEASE_TABLET | Freq: Two times a day (BID) | ORAL | 0 refills | Status: DC

## 2016-10-18 MED ORDER — SUCRALFATE 1 GM/10ML PO SUSP: 1.0000 g | Freq: Two times a day (BID) | ORAL | 0 refills | Status: DC

## 2016-10-18 NOTE — Care Management CC44 (Signed)
Condition Code 44 Documentation Completed  Patient Details  Name: Calvin Mccormick MRN: [id number] Date of Birth: 1945/04/18   Condition Code 44 given:  Yes Patient signature on Condition Code 44 notice:  Yes Documentation of 2 MD's agreement:  Yes Code 44 added to claim:  Yes    Leeroy Cha, RN 10/18/2016, 11:46 AM

## 2016-10-18 NOTE — Discharge Summary (Signed)
Physician Discharge Summary  POSEIDON PAM DHR:[id number] DOB: 1945-01-21 DOA: 10/16/2016  PCP: Lucianne Lei, MD  Admit date: 10/16/2016 Discharge date: 10/18/2016  Admitted From: Home Disposition:  Home  Recommendations for Outpatient Follow-up:  1. Follow up with PCP in 1-2 weeks 2. Recommend follow up with GI  Discharge Condition:Stable CODE STATUS:Full Diet recommendation: Regular   Brief/Interim Summary: 71yo who presented with 24 h of coffee ground emesis x 2 episodes today. On daily aspirin.He's been feeling very weak on arrival was placed on nonrebreather. In ERwas found to [employer] rectum although Hemoccult negative. No hx of melena prior to today initially hypotensive down to 80. Sp 2Lnow blood pressures improved and to 110type and screen ordered but blood has to come from [facility] can be moment 12 hours prior to being available at this point patient's hemodynamically improving. Denies syncope. He had a fall this AM due to generalized fatigue, no full syncope. no head injury.  No chest or abdominal pain. No prior hx of GI bleed.  Discharge Diagnoses:  Active Problems:   Multiple myeloma in relapse (HCC)   CKD (chronic kidney disease), stage III (HCC)   HTN (hypertension)   Protein calorie malnutrition (HCC)   Acute renal failure superimposed on stage 3 chronic kidney disease (HCC)   Aspiration pneumonia (HCC)   AKI (acute kidney injury) ([place])   Upper GI bleed   Acute blood loss anemia   1. Acute blood loss anemia 1. hgb overall stable overnight 2. Hgb trended to 7.3. Transfused with 1 unit prbc's with appropriate correction 3. Repeat cbc in AM 4. Likely secondary to severe esophagitis with ulcers as seen on EGD 2. Upper gi bleed 1. Suspect secondary to severe esophagitis and ulcers as seen on EGD 2. Recommendation to continue diet, BID PPI x 4weeks and carafate BID x 4 weeks 3. If on ASA, then daily PPI indefinitely 3. Aspiration PNA 1. Cont  on zosyn while admitted 2. Pt to complete course of augmentin on discharge 3. On minimal O2 support 4. HTN 1. BP stable at present 2. Cont monitor 5. Acute on CKD3 1. Stable  Discharge Instructions   Allergies as of 10/18/2016      Reactions   Lactose Intolerance (gi) Nausea And Vomiting   Pt said he is not ??      Medication List    TAKE these medications   acyclovir 400 MG tablet Commonly known as:  ZOVIRAX Take 1 tablet (400 mg total) by mouth daily.   allopurinol 100 MG tablet Commonly known as:  ZYLOPRIM Take 100 mg by mouth 2 (two) times daily.   amLODipine 10 MG tablet Commonly known as:  NORVASC Take 10 mg by mouth daily.   amoxicillin-clavulanate 875-125 MG tablet Commonly known as:  AUGMENTIN Take 1 tablet by mouth 2 (two) times daily.   aspirin EC 81 MG tablet Take 81 mg by mouth daily.   atorvastatin 20 MG tablet Commonly known as:  LIPITOR Take 20 mg by mouth at bedtime.   brimonidine 0.15 % ophthalmic solution Commonly known as:  ALPHAGAN Place 1 drop into the right eye 3 (three) times daily.   cholecalciferol 1000 units tablet Commonly known as:  VITAMIN D Take 1,000 Units by mouth daily.   dexamethasone 4 MG tablet Commonly known as:  DECADRON Take 5 tablets every Mondays   dorzolamide-timolol 22.3-6.8 MG/ML ophthalmic solution Commonly known as:  COSOPT Place 1 drop into both eyes 2 (two) times daily.   fluorometholone 0.1 %  ophthalmic suspension Commonly known as:  FML Place 1 drop into both eyes daily.   hydrochlorothiazide 25 MG tablet Commonly known as:  HYDRODIURIL Take 25 mg by mouth daily.   KLOR-CON M20 20 MEQ tablet Generic drug:  potassium chloride SA Take 20 mEq by mouth daily.   latanoprost 0.005 % ophthalmic solution Commonly known as:  XALATAN Place 1 drop into the right eye at bedtime.   lidocaine-prilocaine cream Commonly known as:  EMLA Apply 1 application topically as needed.   lidocaine-prilocaine  cream Commonly known as:  EMLA Apply to affected area once   loperamide 2 MG tablet Commonly known as:  IMODIUM A-D 1 po after each watery BM.  Maximum 6 per 24hrs.   ondansetron 8 MG tablet Commonly known as:  ZOFRAN Take 1 tablet (8 mg total) by mouth 2 (two) times daily as needed (Nausea or vomiting).   pantoprazole 40 MG tablet Commonly known as:  PROTONIX Take 1 tablet (40 mg total) by mouth 2 (two) times daily before a meal.   pomalidomide 2 MG capsule Commonly known as:  POMALYST Take 1 capsule (2 mg total) by mouth daily. Take with water on days 1-21. Repeat every 28 days.   prochlorperazine 10 MG tablet Commonly known as:  COMPAZINE Take 1 tablet (10 mg total) by mouth every 6 (six) hours as needed (Nausea or vomiting).   sucralfate 1 GM/10ML suspension Commonly known as:  CARAFATE Take 10 mLs (1 g total) by mouth 2 (two) times daily.      Follow-up Information    Lucianne Lei, MD. Schedule an appointment as soon as possible for a visit in 1 week(s).   Specialty:  Family Medicine Contact information: [address] [place] [address] [phone]          Allergies  Allergen Reactions  . Lactose Intolerance (Gi) Nausea And Vomiting    Pt said he is not ??    Consultations:  GI  Procedures/Studies: Dg Chest Port 1 View  Result Date: 10/16/2016 CLINICAL DATA:  71 year old male with a history of vomiting. EXAM: PORTABLE CHEST 1 VIEW COMPARISON:  04/07/2015 FINDINGS: Cardiomediastinal silhouette unchanged in size and contour. Patchy opacities at the bilateral lung bases with mild interlobular septal thickening. No pneumothorax. No large pleural effusion. Port catheter on the right chest wall via IJ approach with the catheter appearing to terminate in the superior vena cava. No displaced fracture IMPRESSION: Ill-defined opacities at the lung bases with interlobular septal thickening suggesting early pulmonary edema, however, atypical infection not  excluded. Right IJ port catheter. Electronically Signed   By: Corrie Mckusick D.O.   On: 10/16/2016 19:48    Subjective: Without complaints  Discharge Exam: Vitals:   10/18/16 1000 10/18/16 1100  BP: (!) 116/49 (!) 118/51  Pulse: 64 64  Resp: 17 16  Temp:    SpO2: 93% 94%   Vitals:   10/18/16 0600 10/18/16 0800 10/18/16 1000 10/18/16 1100  BP: (!) 110/54 (!) 106/51 (!) 116/49 (!) 118/51  Pulse: (!) 58 67 64 64  Resp: '14 18 17 16  ' Temp:  98.1 F (36.7 C)    TempSrc:  Oral    SpO2: 98% 92% 93% 94%  Weight:      Height:        General: Pt is alert, awake, not in acute distress Cardiovascular: RRR, S1/S2 +, no rubs, no gallops Respiratory: CTA bilaterally, no wheezing, no rhonchi Abdominal: Soft, NT, ND, bowel sounds + Extremities: no  edema, no cyanosis   The results of significant diagnostics from this hospitalization (including imaging, microbiology, ancillary and laboratory) are listed below for reference.     Microbiology: Recent Results (from the past 240 hour(s))  MRSA PCR Screening     Status: None   Collection Time: 10/17/16 12:53 AM  Result Value Ref Range Status   MRSA by PCR NEGATIVE NEGATIVE Final    Comment:        The GeneXpert MRSA Assay (FDA approved for NASAL specimens only), is one component of a comprehensive MRSA colonization surveillance program. It is not intended to diagnose MRSA infection nor to guide or monitor treatment for MRSA infections.      Labs: BNP (last 3 results) No results for input(s): BNP in the last 8760 hours. Basic Metabolic Panel:  Recent Labs Lab 10/16/16 0748 10/16/16 1948 10/16/16 2141 10/17/16 0056 10/18/16 0630  NA 138 135 138 138 137  K 4.2 3.7 3.7 4.0 3.5  CL  --  99* 101 103 106  CO2 26 18*  --  15* 22  GLUCOSE 122 194* 156* 155* 98  BUN 53.3* 77* 69* 77* 68*  CREATININE 1.7* 2.36* 2.30* 2.68* 2.24*  CALCIUM 9.9 8.5*  --  7.9* 7.1*  MG  --   --   --  1.0*  --   PHOS  --   --   --  5.4*  --     Liver Function Tests:  Recent Labs Lab 10/16/16 0748 10/16/16 1948 10/17/16 0056  AST '11 22 28  ' ALT 13 14* 15*  ALKPHOS 56 48 33*  BILITOT 0.36 0.4 0.6  PROT 6.1* 5.4* 4.4*  ALBUMIN 3.0* 2.7* 2.3*    Recent Labs Lab 10/16/16 1948  LIPASE 25   No results for input(s): AMMONIA in the last 168 hours. CBC:  Recent Labs Lab 10/16/16 0747  10/16/16 1948 10/16/16 2141 10/17/16 0056 10/17/16 0803 10/17/16 1522 10/18/16 0630  WBC 10.0  --  6.1  --  10.1 9.9 11.1* 8.2  NEUTROABS 7.5*  --  4.8  --   --   --   --   --   HGB 8.6*  < > 8.7* 7.1* 7.3* 8.7* 7.2* 8.1*  HCT 25.8*  < > 24.8* 21.0* 20.7* 24.2* 20.2* 23.2*  MCV 95.9  --  95.0  --  95.0 87.4 87.8 86.6  PLT 245  --  323  --  267 192 163 137*  < > = values in this interval not displayed. Cardiac Enzymes:  Recent Labs Lab 10/17/16 0056 10/17/16 0550  TROPONINI <0.03 <0.03   BNP: Invalid input(s): POCBNP CBG: No results for input(s): GLUCAP in the last 168 hours. D-Dimer No results for input(s): DDIMER in the last 72 hours. Hgb A1c No results for input(s): HGBA1C in the last 72 hours. Lipid Profile No results for input(s): CHOL, HDL, LDLCALC, TRIG, CHOLHDL, LDLDIRECT in the last 72 hours. Thyroid function studies  Recent Labs  10/17/16 0056  TSH 7.639*   Anemia work up No results for input(s): VITAMINB12, FOLATE, FERRITIN, TIBC, IRON, RETICCTPCT in the last 72 hours. Urinalysis    Component Value Date/Time   COLORURINE YELLOW 04/08/2015 0129   APPEARANCEUR CLEAR 04/08/2015 0129   LABSPEC 1.013 04/08/2015 0129   PHURINE 6.0 04/08/2015 0129   GLUCOSEU NEGATIVE 04/08/2015 0129   HGBUR SMALL (A) 04/08/2015 0129   BILIRUBINUR NEGATIVE 04/08/2015 0129   KETONESUR NEGATIVE 04/08/2015 0129   PROTEINUR 30 (A) 04/08/2015 0129   UROBILINOGEN  0.2 07/24/2011 1501   NITRITE NEGATIVE 04/08/2015 0129   LEUKOCYTESUR NEGATIVE 04/08/2015 0129   Sepsis Labs Invalid input(s): PROCALCITONIN,  WBC,   LACTICIDVEN Microbiology Recent Results (from the past 240 hour(s))  MRSA PCR Screening     Status: None   Collection Time: 10/17/16 12:53 AM  Result Value Ref Range Status   MRSA by PCR NEGATIVE NEGATIVE Final    Comment:        The GeneXpert MRSA Assay (FDA approved for NASAL specimens only), is one component of a comprehensive MRSA colonization surveillance program. It is not intended to diagnose MRSA infection nor to guide or monitor treatment for MRSA infections.      SIGNED:   Donne Hazel, MD  [facility] 10/18/2016, 5:55 PM  If 7PM-7AM, please contact night-coverage [email or web address] Password [email or web address]

## 2016-10-18 NOTE — Care Management Obs Status (Signed)
[place] NOTIFICATION   Patient Details  Name: ZEBEDEE SEGUNDO MRN: [id number] Date of Birth: 1945-05-06   Medicare Observation Status Notification Given:  Yes    Leeroy Cha, RN 10/18/2016, 11:45 AM

## 2016-10-20 LAB — BPAM RBC
BLOOD PRODUCT EXPIRATION DATE: 201810192359
Blood Product Expiration Date: 201811142359
Blood Product Expiration Date: 201811142359
ISSUE DATE / TIME: 201810110111
ISSUE DATE / TIME: 201810120156
UNIT TYPE AND RH: 5100
UNIT TYPE AND RH: 5100
Unit Type and Rh: 5100

## 2016-10-20 LAB — TYPE AND SCREEN
ABO/RH(D): O POS
Antibody Screen: POSITIVE
DAT, IGG: NEGATIVE
UNIT DIVISION: 0
Unit division: 0
Unit division: 0

## 2016-10-21 LAB — MULTIPLE MYELOMA PANEL, SERUM
ALBUMIN/GLOB SERPL: 1.1 (ref 0.7–1.7)
ALPHA2 GLOB SERPL ELPH-MCNC: 0.8 g/dL (ref 0.4–1.0)
Albumin SerPl Elph-Mcnc: 2.8 g/dL — ABNORMAL LOW (ref 2.9–4.4)
Alpha 1: 0.3 g/dL (ref 0.0–0.4)
B-Globulin SerPl Elph-Mcnc: 1 g/dL (ref 0.7–1.3)
Gamma Glob SerPl Elph-Mcnc: 0.7 g/dL (ref 0.4–1.8)
Globulin, Total: 2.8 g/dL (ref 2.2–3.9)
IGA/IMMUNOGLOBULIN A, SERUM: 48 mg/dL — AB (ref 61–437)
IGM (IMMUNOGLOBIN M), SRM: 8 mg/dL — AB (ref 15–143)
IgG, Qn, Serum: 715 mg/dL (ref 700–1600)
M Protein SerPl Elph-Mcnc: 0.5 g/dL — ABNORMAL HIGH
Total Protein: 5.6 g/dL — ABNORMAL LOW (ref 6.0–8.5)

## 2016-10-28 ENCOUNTER — Other Ambulatory Visit: Payer: Self-pay | Admitting: Hematology and Oncology

## 2016-10-28 DIAGNOSIS — C9001 Multiple myeloma in remission: Secondary | ICD-10-CM

## 2016-11-01 ENCOUNTER — Other Ambulatory Visit: Payer: Self-pay | Admitting: *Deleted

## 2016-11-01 DIAGNOSIS — C9002 Multiple myeloma in relapse: Secondary | ICD-10-CM

## 2016-11-01 MED ORDER — POMALIDOMIDE 2 MG PO CAPS: 2.0000 mg | ORAL_CAPSULE | Freq: Every day | ORAL | 0 refills | Status: DC

## 2016-11-11 ENCOUNTER — Other Ambulatory Visit: Payer: Self-pay | Admitting: Hematology and Oncology

## 2016-11-11 DIAGNOSIS — D62 Acute posthemorrhagic anemia: Secondary | ICD-10-CM

## 2016-11-13 ENCOUNTER — Encounter: Payer: Self-pay | Admitting: Hematology and Oncology

## 2016-11-13 ENCOUNTER — Ambulatory Visit (HOSPITAL_BASED_OUTPATIENT_CLINIC_OR_DEPARTMENT_OTHER): Payer: Medicare Other

## 2016-11-13 ENCOUNTER — Ambulatory Visit: Payer: Medicare Other

## 2016-11-13 ENCOUNTER — Other Ambulatory Visit (HOSPITAL_BASED_OUTPATIENT_CLINIC_OR_DEPARTMENT_OTHER): Payer: Medicare Other

## 2016-11-13 ENCOUNTER — Ambulatory Visit (HOSPITAL_BASED_OUTPATIENT_CLINIC_OR_DEPARTMENT_OTHER): Payer: Medicare Other | Admitting: Hematology and Oncology

## 2016-11-13 ENCOUNTER — Telehealth: Payer: Self-pay | Admitting: Hematology and Oncology

## 2016-11-13 VITALS — BP 124/58 | HR 58 | Temp 97.9°F | Resp 18

## 2016-11-13 DIAGNOSIS — N183 Chronic kidney disease, stage 3 unspecified: Secondary | ICD-10-CM

## 2016-11-13 DIAGNOSIS — D631 Anemia in chronic kidney disease: Secondary | ICD-10-CM | POA: Diagnosis not present

## 2016-11-13 DIAGNOSIS — C9002 Multiple myeloma in relapse: Secondary | ICD-10-CM

## 2016-11-13 DIAGNOSIS — Z95828 Presence of other vascular implants and grafts: Secondary | ICD-10-CM

## 2016-11-13 DIAGNOSIS — D509 Iron deficiency anemia, unspecified: Secondary | ICD-10-CM | POA: Diagnosis not present

## 2016-11-13 DIAGNOSIS — C9001 Multiple myeloma in remission: Secondary | ICD-10-CM

## 2016-11-13 DIAGNOSIS — Z5112 Encounter for antineoplastic immunotherapy: Secondary | ICD-10-CM

## 2016-11-13 DIAGNOSIS — D62 Acute posthemorrhagic anemia: Secondary | ICD-10-CM

## 2016-11-13 LAB — CBC WITH DIFFERENTIAL/PLATELET
BASO%: 2.4 % — ABNORMAL HIGH (ref 0.0–2.0)
Basophils Absolute: 0.1 10*3/uL (ref 0.0–0.1)
EOS ABS: 0.2 10*3/uL (ref 0.0–0.5)
EOS%: 4.1 % (ref 0.0–7.0)
HCT: 27.4 % — ABNORMAL LOW (ref 38.4–49.9)
HGB: 9.2 g/dL — ABNORMAL LOW (ref 13.0–17.1)
LYMPH%: 42.8 % (ref 14.0–49.0)
MCH: 31 pg (ref 27.2–33.4)
MCHC: 33.5 g/dL (ref 32.0–36.0)
MCV: 92.4 fL (ref 79.3–98.0)
MONO#: 0.4 10*3/uL (ref 0.1–0.9)
MONO%: 8.2 % (ref 0.0–14.0)
NEUT%: 42.5 % (ref 39.0–75.0)
NEUTROS ABS: 2.2 10*3/uL (ref 1.5–6.5)
Platelets: 311 10*3/uL (ref 140–400)
RBC: 2.97 10*6/uL — AB (ref 4.20–5.82)
RDW: 22.2 % — ABNORMAL HIGH (ref 11.0–14.6)
WBC: 5.2 10*3/uL (ref 4.0–10.3)
lymph#: 2.2 10*3/uL (ref 0.9–3.3)

## 2016-11-13 LAB — COMPREHENSIVE METABOLIC PANEL
ALBUMIN: 2.9 g/dL — AB (ref 3.5–5.0)
ALT: 9 U/L (ref 0–55)
AST: 13 U/L (ref 5–34)
Alkaline Phosphatase: 60 U/L (ref 40–150)
Anion Gap: 8 mEq/L (ref 3–11)
BUN: 12.8 mg/dL (ref 7.0–26.0)
CALCIUM: 8.1 mg/dL — AB (ref 8.4–10.4)
CHLORIDE: 109 meq/L (ref 98–109)
CO2: 21 mEq/L — ABNORMAL LOW (ref 22–29)
CREATININE: 1.9 mg/dL — AB (ref 0.7–1.3)
EGFR: 40 mL/min/{1.73_m2} — ABNORMAL LOW (ref >60)
Glucose: 103 mg/dl (ref 70–140)
Potassium: 3.8 mEq/L (ref 3.5–5.1)
Sodium: 138 mEq/L (ref 136–145)
Total Bilirubin: 0.3 mg/dL (ref 0.20–1.20)
Total Protein: 6 g/dL — ABNORMAL LOW (ref 6.4–8.3)

## 2016-11-13 LAB — IRON AND TIBC
%SAT: 14 % — ABNORMAL LOW (ref 20–55)
IRON: 35 ug/dL — AB (ref 42–163)
TIBC: 253 ug/dL (ref 202–409)
UIBC: 218 ug/dL (ref 117–376)

## 2016-11-13 LAB — FERRITIN: Ferritin: 188 ng/ml (ref 22–316)

## 2016-11-13 MED ORDER — SODIUM CHLORIDE 0.9 % IV SOLN
Freq: Once | INTRAVENOUS | Status: AC
  Administered 2016-11-13: 10:00:00 via INTRAVENOUS

## 2016-11-13 MED ORDER — ACETAMINOPHEN 325 MG PO TABS
ORAL_TABLET | ORAL | Status: AC
Start: 2016-11-13 — End: 2016-11-13
  Filled 2016-11-13: qty 2

## 2016-11-13 MED ORDER — ACETAMINOPHEN 325 MG PO TABS
650.0000 mg | ORAL_TABLET | Freq: Once | ORAL | Status: AC
  Administered 2016-11-13: 650 mg via ORAL

## 2016-11-13 MED ORDER — HEPARIN SOD (PORK) LOCK FLUSH 100 UNIT/ML IV SOLN
500.0000 [IU] | Freq: Once | INTRAVENOUS | Status: AC | PRN
  Administered 2016-11-13: 500 [IU]
  Filled 2016-11-13: qty 5

## 2016-11-13 MED ORDER — DIPHENHYDRAMINE HCL 25 MG PO CAPS
ORAL_CAPSULE | ORAL | Status: AC
  Filled 2016-11-13: qty 2

## 2016-11-13 MED ORDER — ZOLEDRONIC ACID 4 MG/5ML IV CONC
3.0000 mg | Freq: Once | INTRAVENOUS | Status: AC
  Administered 2016-11-13: 3 mg via INTRAVENOUS
  Filled 2016-11-13: qty 3.75

## 2016-11-13 MED ORDER — SODIUM CHLORIDE 0.9% FLUSH
10.0000 mL | Freq: Once | INTRAVENOUS | Status: AC
  Administered 2016-11-13: 10 mL
  Filled 2016-11-13: qty 10

## 2016-11-13 MED ORDER — DIPHENHYDRAMINE HCL 25 MG PO CAPS
50.0000 mg | ORAL_CAPSULE | Freq: Once | ORAL | Status: AC
  Administered 2016-11-13: 50 mg via ORAL

## 2016-11-13 MED ORDER — SODIUM CHLORIDE 0.9% FLUSH
10.0000 mL | INTRAVENOUS | Status: DC | PRN
  Administered 2016-11-13: 10 mL
  Filled 2016-11-13: qty 10

## 2016-11-13 MED ORDER — METHYLPREDNISOLONE SODIUM SUCC 40 MG IJ SOLR
40.0000 mg | Freq: Once | INTRAMUSCULAR | Status: AC
  Administered 2016-11-13: 40 mg via INTRAVENOUS

## 2016-11-13 MED ORDER — SODIUM CHLORIDE 0.9 % IV SOLN: Freq: Once | INTRAVENOUS | Status: DC

## 2016-11-13 MED ORDER — METHYLPREDNISOLONE SODIUM SUCC 40 MG IJ SOLR
INTRAMUSCULAR | Status: AC
  Filled 2016-11-13: qty 1

## 2016-11-13 MED ORDER — SODIUM CHLORIDE 0.9 % IV SOLN
1200.0000 mg | Freq: Once | INTRAVENOUS | Status: AC
  Administered 2016-11-13: 1200 mg via INTRAVENOUS
  Filled 2016-11-13: qty 60

## 2016-11-13 MED ORDER — PROCHLORPERAZINE MALEATE 10 MG PO TABS
10.0000 mg | ORAL_TABLET | Freq: Once | ORAL | Status: AC
  Administered 2016-11-13: 10 mg via ORAL

## 2016-11-13 MED ORDER — PROCHLORPERAZINE MALEATE 10 MG PO TABS
ORAL_TABLET | ORAL | Status: AC
  Filled 2016-11-13: qty 1

## 2016-11-13 NOTE — Progress Notes (Signed)
[place] OFFICE PROGRESS NOTE  Patient Care Team: Lucianne Lei, MD as PCP - General (Family Medicine) Heath Lark, MD as Consulting Physician (Hematology and Oncology) Pleasant, [name], RN as [place] Management  SUMMARY OF ONCOLOGIC HISTORY:   Multiple myeloma in relapse [facility])   12/12/2010 Initial Diagnosis    Multiple myeloma      02/16/2014 Imaging    Skeletal survey showed diffuse osteopenia      03/02/2014 Bone Marrow Biopsy    Accession: [id number] BM biopsy showed only 5 % plasma cells. However, the biopsy was difficult and the bone was fragmented during the procedure. Cytogenetics and FISH study is normal      03/03/2014 Procedure    he has port placement      03/10/2014 - 05/19/2014 Chemotherapy    he received elotuzumab and revlimid      05/20/2014 - 02/25/2016 Chemotherapy    Treatment is switched to maintenance Revlimid only. Treatment is stopped due to progressive disease      11/15/2014 Adverse Reaction    He has mild worsening anemia. Does of Revlimid reduced to 5 mg 21 days on, 7 days off      03/06/2016 -  Chemotherapy    He received Daratumumab and Velcade. Velcade is stopped on 07/24/16      08/14/2016 Miscellaneous    Pomalyst is added along with Daratumumab      10/17/2016 Surgery    EGD was performed for coffee-ground emesis. Multiple linear superficial ulcers were noted in the esophagus from 25-35 cm from insertion. Severe esophagitis was noted from 30-35 cm from insertion. Multiple biopsies were obtained. A small hiatal hernia was noted. The gastric cavity contained coffee-ground fluid, after suctioning and lavage, no obvious gastric lesions/erosion/ulceration/erythema was noted. Biopsies were taken from the antrum to rule out H. Pylori. Duodenum bulb appeared unremarkable. A small diverticulum was noted in second portion of the duodenum.         INTERVAL HISTORY: Please see below for problem oriented  charting. He is seen in the infusion room He was recently hospitalized due to GI bleed, resolved He denies new bone pain No recent infection The patient denies any recent signs or symptoms of bleeding such as spontaneous epistaxis, hematuria or hematochezia.   REVIEW OF SYSTEMS:   Constitutional: Denies fevers, chills or abnormal weight loss Eyes: Denies blurriness of vision Ears, nose, mouth, throat, and face: Denies mucositis or sore throat Respiratory: Denies cough, dyspnea or wheezes Cardiovascular: Denies palpitation, chest discomfort or lower extremity swelling Gastrointestinal:  Denies nausea, heartburn or change in bowel habits Skin: Denies abnormal skin rashes Lymphatics: Denies new lymphadenopathy or easy bruising Neurological:Denies numbness, tingling or new weaknesses Behavioral/Psych: Mood is stable, no new changes  All other systems were reviewed with the patient and are negative.  I have reviewed the past medical history, past surgical history, social history and family history with the patient and they are unchanged from previous note.  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  MEDICATIONS:  Current Outpatient Medications  Medication Sig Dispense Refill  . acyclovir (ZOVIRAX) 400 MG tablet Take 1 tablet (400 mg total) by mouth daily. 60 tablet 11  . allopurinol (ZYLOPRIM) 100 MG tablet Take 100 mg by mouth 2 (two) times daily.     Marland Kitchen amLODipine (NORVASC) 10 MG tablet Take 10 mg by mouth daily.  1  . aspirin EC 81 MG tablet Take 81 mg by mouth daily.    Marland Kitchen  atorvastatin (LIPITOR) 20 MG tablet Take 20 mg by mouth at bedtime.     . brimonidine (ALPHAGAN) 0.15 % ophthalmic solution Place 1 drop into the right eye 3 (three) times daily.    . cholecalciferol (VITAMIN D) 1000 UNITS tablet Take 1,000 Units by mouth daily.    . dorzolamide-timolol (COSOPT) 22.3-6.8 MG/ML ophthalmic solution Place 1 drop into both eyes 2 (two) times daily.     . fluorometholone  (FML) 0.1 % ophthalmic suspension Place 1 drop into both eyes daily.    . hydrochlorothiazide (HYDRODIURIL) 25 MG tablet Take 25 mg by mouth daily.  1  . KLOR-CON M20 20 MEQ tablet Take 20 mEq by mouth daily.  1  . latanoprost (XALATAN) 0.005 % ophthalmic solution Place 1 drop into the right eye at bedtime.    . lidocaine-prilocaine (EMLA) cream Apply 1 application topically as needed. (Patient not taking: Reported on 10/16/2016) 30 g 1  . lidocaine-prilocaine (EMLA) cream Apply to affected area once 30 g 3  . loperamide (IMODIUM A-D) 2 MG tablet 1 po after each watery BM.  Maximum 6 per 24hrs. 30 tablet PRN  . ondansetron (ZOFRAN) 8 MG tablet Take 1 tablet (8 mg total) by mouth 2 (two) times daily as needed (Nausea or vomiting). 30 tablet 1  . pantoprazole (PROTONIX) 40 MG tablet Take 1 tablet (40 mg total) by mouth 2 (two) times daily before a meal. 60 tablet 0  . pomalidomide (POMALYST) 2 MG capsule Take 1 capsule (2 mg total) by mouth daily. Take with water on days 1-21. Repeat every 28 days. 21 capsule 0  . prochlorperazine (COMPAZINE) 10 MG tablet Take 1 tablet (10 mg total) by mouth every 6 (six) hours as needed (Nausea or vomiting). 30 tablet 1  . sucralfate (CARAFATE) 1 GM/10ML suspension Take 10 mLs (1 g total) by mouth 2 (two) times daily. 420 mL 0   No current facility-administered medications for this visit.    Facility-Administered Medications Ordered in Other Visits  Medication Dose Route Frequency Provider Last Rate Last Dose  . 0.9 %  sodium chloride infusion   Intravenous Once Alvy Bimler, Brayah Urquilla, MD      . daratumumab (DARZALEX) 1,200 mg in sodium chloride 0.9 % 440 mL chemo infusion  1,200 mg Intravenous Once Alvy Bimler, Hetvi Shawhan, MD      . heparin lock flush 100 unit/mL  500 Units Intracatheter Once PRN Alvy Bimler, Cyndy Braver, MD      . sodium chloride flush (NS) 0.9 % injection 10 mL  10 mL Intracatheter PRN Alvy Bimler, Emme Rosenau, MD      . zolendronic acid (ZOMETA) 3 mg in sodium chloride 0.9 % 100 mL IVPB  3  mg Intravenous Once Alvy Bimler, Stewart Pimenta, MD 415 mL/hr at 11/13/16 1036 3 mg at 11/13/16 1036    PHYSICAL EXAMINATION: ECOG PERFORMANCE STATUS: 2 - Symptomatic, <50% confined to bed  There were no vitals filed for this visit. There were no vitals filed for this visit.  GENERAL:alert, no distress and comfortable SKIN: skin color, texture, turgor are normal, no rashes or significant lesions EYES: normal, Conjunctiva are pink and non-injected, sclera clear OROPHARYNX:no exudate, no erythema and lips, buccal mucosa, and tongue normal  NECK: supple, thyroid normal size, non-tender, without nodularity LYMPH:  no palpable lymphadenopathy in the cervical, axillary or inguinal LUNGS: clear to auscultation and percussion with normal breathing effort HEART: regular rate & rhythm and no murmurs and no lower extremity edema ABDOMEN:abdomen soft, non-tender and normal bowel sounds Musculoskeletal:no cyanosis of digits  and no clubbing  NEURO: alert & oriented x 3 with fluent speech, no focal motor/sensory deficits  LABORATORY DATA:  I have reviewed the data as listed    Component Value Date/Time   NA 138 11/13/2016 0836   K 3.8 11/13/2016 0836   CL 106 10/18/2016 0630   CL 107 06/29/2012 1131   CO2 21 (L) 11/13/2016 0836   GLUCOSE 103 11/13/2016 0836   GLUCOSE 143 (H) 06/29/2012 1131   BUN 12.8 11/13/2016 0836   CREATININE 1.9 (H) 11/13/2016 0836   CALCIUM 8.1 (L) 11/13/2016 0836   PROT 6.0 (L) 11/13/2016 0836   ALBUMIN 2.9 (L) 11/13/2016 0836   AST 13 11/13/2016 0836   ALT 9 11/13/2016 0836   ALKPHOS 60 11/13/2016 0836   BILITOT 0.30 11/13/2016 0836   GFRNONAA 27 (L) 10/18/2016 0630   GFRAA 32 (L) 10/18/2016 0630    No results found for: SPEP, UPEP  Lab Results  Component Value Date   WBC 5.2 11/13/2016   NEUTROABS 2.2 11/13/2016   HGB 9.2 (L) 11/13/2016   HCT 27.4 (L) 11/13/2016   MCV 92.4 11/13/2016   PLT 311 11/13/2016      Chemistry      Component Value Date/Time   NA 138  11/13/2016 0836   K 3.8 11/13/2016 0836   CL 106 10/18/2016 0630   CL 107 06/29/2012 1131   CO2 21 (L) 11/13/2016 0836   BUN 12.8 11/13/2016 0836   CREATININE 1.9 (H) 11/13/2016 0836      Component Value Date/Time   CALCIUM 8.1 (L) 11/13/2016 0836   ALKPHOS 60 11/13/2016 0836   AST 13 11/13/2016 0836   ALT 9 11/13/2016 0836   BILITOT 0.30 11/13/2016 0836       RADIOGRAPHIC STUDIES: I have personally reviewed the radiological images as listed and agreed with the findings in the report. Dg Chest Port 1 View  Result Date: 10/16/2016 CLINICAL DATA:  71 year old male with a history of vomiting. EXAM: PORTABLE CHEST 1 VIEW COMPARISON:  04/07/2015 FINDINGS: Cardiomediastinal silhouette unchanged in size and contour. Patchy opacities at the bilateral lung bases with mild interlobular septal thickening. No pneumothorax. No large pleural effusion. Port catheter on the right chest wall via IJ approach with the catheter appearing to terminate in the superior vena cava. No displaced fracture IMPRESSION: Ill-defined opacities at the lung bases with interlobular septal thickening suggesting early pulmonary edema, however, atypical infection not excluded. Right IJ port catheter. Electronically Signed   By: Corrie Mckusick D.O.   On: 10/16/2016 19:48    ASSESSMENT & PLAN:  Multiple myeloma in relapse [facility]) We reviewed his recent blood work Given progression of disease, he was started on Pomalyst on 08/14/2016. Clinically, he appears to be responding very well to treatment  He will continue treatment with Pomalyst 3 weeks on 1 week off. He will continue Daratumumab as scheduled He is reminded to take calcium with vitamin D He will get Zometa every 6 months  Anemia in chronic renal disease He has multifactorial anemia, combination of anemia chronic kidney disease and recent blood loss/iron deficiency anemia His hemoglobin is stable.  Continue close observation  Chronic renal insufficiency, stage III  (moderate) This is chronic in nature. It is stable. Recommend observation only. We'll give him a reduced dose Zometa twice a year due to chronic renal failure He does not require dose adjustment for his chemotherapy.    No orders of the defined types were placed in this encounter.  All questions  were answered. The patient knows to call the clinic with any problems, questions or concerns. No barriers to learning was detected. I spent 15 minutes counseling the patient face to face. The total time spent in the appointment was 20 minutes and more than 50% was on counseling and review of test results     Heath Lark, MD 11/13/2016 10:41 AM

## 2016-11-13 NOTE — Patient Instructions (Addendum)
Daratumumab injection What is this medicine? DARATUMUMAB (dar a toom ue mab) is a monoclonal antibody. It is used to treat multiple myeloma. This medicine may be used for other purposes; ask your health care provider or pharmacist if you have questions. COMMON BRAND NAME(S): DARZALEX What should I tell my health care provider before I take this medicine? They need to know if you have any of these conditions: -infection (especially a virus infection such as chickenpox, cold sores, or herpes) -lung or breathing disease -pregnant or trying to get pregnant -breast-feeding -an unusual or allergic reaction to daratumumab, other medicines, foods, dyes, or preservatives How should I use this medicine? This medicine is for infusion into a vein. It is given by a health care professional in a hospital or clinic setting. Talk to your pediatrician regarding the use of this medicine in children. Special care may be needed. Overdosage: If you think you have taken too much of this medicine contact a poison control center or emergency room at once. NOTE: This medicine is only for you. Do not share this medicine with others. What if I miss a dose? Keep appointments for follow-up doses as directed. It is important not to miss your dose. Call your doctor or health care professional if you are unable to keep an appointment. What may interact with this medicine? Interactions have not been studied. Give your health care provider a list of all the medicines, herbs, non-prescription drugs, or dietary supplements you use. Also tell them if you smoke, drink alcohol, or use illegal drugs. Some items may interact with your medicine. This list may not describe all possible interactions. Give your health care provider a list of all the medicines, herbs, non-prescription drugs, or dietary supplements you use. Also tell them if you smoke, drink alcohol, or use illegal drugs. Some items may interact with your medicine. What  should I watch for while using this medicine? This drug may make you feel generally unwell. Report any side effects. Continue your course of treatment even though you feel ill unless your doctor tells you to stop. This medicine can cause serious allergic reactions. To reduce your risk you may need to take medicine before treatment with this medicine. Take your medicine as directed. This medicine can affect the results of blood tests to match your blood type. These changes can last for up to 6 months after the final dose. Your healthcare provider will do blood tests to match your blood type before you start treatment. Tell all of your healthcare providers that you are being treated with this medicine before receiving a blood transfusion. This medicine can affect the results of some tests used to determine treatment response; extra tests may be needed to evaluate response. Do not become pregnant while taking this medicine or for 3 months after stopping it. Women should inform their doctor if they wish to become pregnant or think they might be pregnant. There is a potential for serious side effects to an unborn child. Talk to your health care professional or pharmacist for more information. What side effects may I notice from receiving this medicine? Side effects that you should report to your doctor or health care professional as soon as possible: -allergic reactions like skin rash, itching or hives, swelling of the face, lips, or tongue -breathing problems -chills -cough -dizziness -feeling faint or lightheaded -headache -low blood counts - this medicine may decrease the number of white blood cells, red blood cells and platelets. You may be at increased risk  for infections and bleeding. -nausea, vomiting -shortness of breath -signs of decreased platelets or bleeding - bruising, pinpoint red spots on the skin, black, tarry stools, blood in the urine -signs of decreased red blood cells - unusually  weak or tired, feeling faint or lightheaded, falls -signs of infection - fever or chills, cough, sore throat, pain or difficulty passing urine Side effects that usually do not require medical attention (report to your doctor or health care professional if they continue or are bothersome): -back pain -diarrhea -muscle cramps -pain, tingling, numbness in the hands or feet -swelling of the ankles, feet, hands -tiredness This list may not describe all possible side effects. Call your doctor for medical advice about side effects. You may report side effects to FDA at 1-800-FDA-1088. Where should I keep my medicine? Keep out of the reach of children. This drug is given in a hospital or clinic and will not be stored at home. NOTE: This sheet is a summary. It may not cover all possible information. If you have questions about this medicine, talk to your doctor, pharmacist, or health care provider.  2018 Elsevier/Gold Standard (2015-01-26 10:38:11) Zoledronic Acid injection (Hypercalcemia, Oncology) What is this medicine? ZOLEDRONIC ACID (ZOE le dron ik AS id) lowers the amount of calcium loss from bone. It is used to treat too much calcium in your blood from cancer. It is also used to prevent complications of cancer that has spread to the bone. This medicine may be used for other purposes; ask your health care provider or pharmacist if you have questions. COMMON BRAND NAME(S): Zometa What should I tell my health care provider before I take this medicine? They need to know if you have any of these conditions: -aspirin-sensitive asthma -cancer, especially if you are receiving medicines used to treat cancer -dental disease or wear dentures -infection -kidney disease -receiving corticosteroids like dexamethasone or prednisone -an unusual or allergic reaction to zoledronic acid, other medicines, foods, dyes, or preservatives -pregnant or trying to get pregnant -breast-feeding How should I use this  medicine? This medicine is for infusion into a vein. It is given by a health care professional in a hospital or clinic setting. Talk to your pediatrician regarding the use of this medicine in children. Special care may be needed. Overdosage: If you think you have taken too much of this medicine contact a poison control center or emergency room at once. NOTE: This medicine is only for you. Do not share this medicine with others. What if I miss a dose? It is important not to miss your dose. Call your doctor or health care professional if you are unable to keep an appointment. What may interact with this medicine? -certain antibiotics given by injection -NSAIDs, medicines for pain and inflammation, like ibuprofen or naproxen -some diuretics like bumetanide, furosemide -teriparatide -thalidomide This list may not describe all possible interactions. Give your health care provider a list of all the medicines, herbs, non-prescription drugs, or dietary supplements you use. Also tell them if you smoke, drink alcohol, or use illegal drugs. Some items may interact with your medicine. What should I watch for while using this medicine? Visit your doctor or health care professional for regular checkups. It may be some time before you see the benefit from this medicine. Do not stop taking your medicine unless your doctor tells you to. Your doctor may order blood tests or other tests to see how you are doing. Women should inform their doctor if they wish to become pregnant or  think they might be pregnant. There is a potential for serious side effects to an unborn child. Talk to your health care professional or pharmacist for more information. You should make sure that you get enough calcium and vitamin D while you are taking this medicine. Discuss the foods you eat and the vitamins you take with your health care professional. Some people who take this medicine have severe bone, joint, and/or muscle pain. This  medicine may also increase your risk for jaw problems or a broken thigh bone. Tell your doctor right away if you have severe pain in your jaw, bones, joints, or muscles. Tell your doctor if you have any pain that does not go away or that gets worse. Tell your dentist and dental surgeon that you are taking this medicine. You should not have major dental surgery while on this medicine. See your dentist to have a dental exam and fix any dental problems before starting this medicine. Take good care of your teeth while on this medicine. Make sure you see your dentist for regular follow-up appointments. What side effects may I notice from receiving this medicine? Side effects that you should report to your doctor or health care professional as soon as possible: -allergic reactions like skin rash, itching or hives, swelling of the face, lips, or tongue -anxiety, confusion, or depression -breathing problems -changes in vision -eye pain -feeling faint or lightheaded, falls -jaw pain, especially after dental work -mouth sores -muscle cramps, stiffness, or weakness -redness, blistering, peeling or loosening of the skin, including inside the mouth -trouble passing urine or change in the amount of urine Side effects that usually do not require medical attention (report to your doctor or health care professional if they continue or are bothersome): -bone, joint, or muscle pain -constipation -diarrhea -fever -hair loss -irritation at site where injected -loss of appetite -nausea, vomiting -stomach upset -trouble sleeping -trouble swallowing -weak or tired This list may not describe all possible side effects. Call your doctor for medical advice about side effects. You may report side effects to FDA at 1-800-FDA-1088. Where should I keep my medicine? This drug is given in a hospital or clinic and will not be stored at home. NOTE: This sheet is a summary. It may not cover all possible information. If  you have questions about this medicine, talk to your doctor, pharmacist, or health care provider.  2018 Elsevier/Gold Standard (2013-05-22 14:19:39)

## 2016-11-13 NOTE — Assessment & Plan Note (Addendum)
We reviewed his recent blood work Given progression of disease, he was started on Pomalyst on 08/14/2016. Clinically, he appears to be responding very well to treatment  He will continue treatment with Pomalyst 3 weeks on 1 week off. He will continue Daratumumab as scheduled He is reminded to take calcium with vitamin D He will get Zometa every 6 months

## 2016-11-13 NOTE — Assessment & Plan Note (Signed)
This is chronic in nature. It is stable. Recommend observation only. We'll give him a reduced dose Zometa twice a year due to chronic renal failure He does not require dose adjustment for his chemotherapy.

## 2016-11-13 NOTE — Telephone Encounter (Signed)
Gave avs and calendar for December and January 2019 °

## 2016-11-13 NOTE — Assessment & Plan Note (Signed)
He has multifactorial anemia, combination of anemia chronic kidney disease and recent blood loss/iron deficiency anemia His hemoglobin is stable.  Continue close observation

## 2016-11-14 LAB — KAPPA/LAMBDA LIGHT CHAINS
IG KAPPA FREE LIGHT CHAIN: 67 mg/L — AB (ref 3.3–19.4)
Ig Lambda Free Light Chain: 10.1 mg/L (ref 5.7–26.3)
Kappa/Lambda FluidC Ratio: 6.63 — ABNORMAL HIGH (ref 0.26–1.65)

## 2016-11-18 LAB — MULTIPLE MYELOMA PANEL, SERUM
ALBUMIN/GLOB SERPL: 1.3 (ref 0.7–1.7)
ALPHA2 GLOB SERPL ELPH-MCNC: 0.7 g/dL (ref 0.4–1.0)
Albumin SerPl Elph-Mcnc: 3 g/dL (ref 2.9–4.4)
Alpha 1: 0.3 g/dL (ref 0.0–0.4)
B-GLOBULIN SERPL ELPH-MCNC: 0.9 g/dL (ref 0.7–1.3)
GAMMA GLOB SERPL ELPH-MCNC: 0.7 g/dL (ref 0.4–1.8)
Globulin, Total: 2.5 g/dL (ref 2.2–3.9)
IGG (IMMUNOGLOBIN G), SERUM: 751 mg/dL (ref 700–1600)
IgA, Qn, Serum: 52 mg/dL — ABNORMAL LOW (ref 61–437)
IgM, Qn, Serum: 8 mg/dL — ABNORMAL LOW (ref 15–143)
M PROTEIN SERPL ELPH-MCNC: 0.4 g/dL — AB
TOTAL PROTEIN: 5.5 g/dL — AB (ref 6.0–8.5)

## 2016-11-22 DIAGNOSIS — E039 Hypothyroidism, unspecified: Secondary | ICD-10-CM | POA: Diagnosis not present

## 2016-11-22 DIAGNOSIS — N189 Chronic kidney disease, unspecified: Secondary | ICD-10-CM | POA: Diagnosis not present

## 2016-11-22 DIAGNOSIS — K2900 Acute gastritis without bleeding: Secondary | ICD-10-CM | POA: Diagnosis not present

## 2016-11-22 DIAGNOSIS — I10 Essential (primary) hypertension: Secondary | ICD-10-CM | POA: Diagnosis not present

## 2016-11-22 DIAGNOSIS — E785 Hyperlipidemia, unspecified: Secondary | ICD-10-CM | POA: Diagnosis not present

## 2016-11-22 DIAGNOSIS — C9000 Multiple myeloma not having achieved remission: Secondary | ICD-10-CM | POA: Diagnosis not present

## 2016-11-29 ENCOUNTER — Other Ambulatory Visit: Payer: Self-pay | Admitting: *Deleted

## 2016-11-29 DIAGNOSIS — C9002 Multiple myeloma in relapse: Secondary | ICD-10-CM

## 2016-11-29 MED ORDER — POMALIDOMIDE 2 MG PO CAPS: 2.0000 mg | ORAL_CAPSULE | Freq: Every day | ORAL | 0 refills | Status: DC

## 2016-12-06 ENCOUNTER — Emergency Department (HOSPITAL_COMMUNITY): Payer: Medicare Other

## 2016-12-06 ENCOUNTER — Encounter (HOSPITAL_COMMUNITY): Payer: Self-pay

## 2016-12-06 ENCOUNTER — Other Ambulatory Visit: Payer: Self-pay

## 2016-12-06 ENCOUNTER — Emergency Department (HOSPITAL_COMMUNITY)
Admission: EM | Admit: 2016-12-06 | Discharge: 2016-12-07 | Disposition: A | Discharge status: Home / Self Care | Payer: Medicare Other | Attending: Emergency Medicine | Admitting: Emergency Medicine

## 2016-12-06 DIAGNOSIS — R1110 Vomiting, unspecified: Secondary | ICD-10-CM | POA: Diagnosis not present

## 2016-12-06 DIAGNOSIS — I129 Hypertensive chronic kidney disease with stage 1 through stage 4 chronic kidney disease, or unspecified chronic kidney disease: Secondary | ICD-10-CM | POA: Diagnosis not present

## 2016-12-06 DIAGNOSIS — Z79899 Other long term (current) drug therapy: Secondary | ICD-10-CM | POA: Insufficient documentation

## 2016-12-06 DIAGNOSIS — R1111 Vomiting without nausea: Secondary | ICD-10-CM | POA: Diagnosis not present

## 2016-12-06 DIAGNOSIS — C9000 Multiple myeloma not having achieved remission: Secondary | ICD-10-CM | POA: Insufficient documentation

## 2016-12-06 DIAGNOSIS — R112 Nausea with vomiting, unspecified: Secondary | ICD-10-CM | POA: Diagnosis not present

## 2016-12-06 DIAGNOSIS — N183 Chronic kidney disease, stage 3 (moderate): Secondary | ICD-10-CM | POA: Insufficient documentation

## 2016-12-06 DIAGNOSIS — Z7982 Long term (current) use of aspirin: Secondary | ICD-10-CM | POA: Insufficient documentation

## 2016-12-06 DIAGNOSIS — R1033 Periumbilical pain: Secondary | ICD-10-CM | POA: Diagnosis not present

## 2016-12-06 LAB — URINALYSIS, ROUTINE W REFLEX MICROSCOPIC
BILIRUBIN URINE: NEGATIVE
GLUCOSE, UA: NEGATIVE mg/dL
HGB URINE DIPSTICK: NEGATIVE
KETONES UR: 5 mg/dL — AB
LEUKOCYTES UA: NEGATIVE
NITRITE: NEGATIVE
PROTEIN: 30 mg/dL — AB
Specific Gravity, Urine: 1.014 (ref 1.005–1.030)
pH: 6 (ref 5.0–8.0)

## 2016-12-06 LAB — COMPREHENSIVE METABOLIC PANEL
ALBUMIN: 3.6 g/dL (ref 3.5–5.0)
ALT: 15 U/L — ABNORMAL LOW (ref 17–63)
ANION GAP: 12 (ref 5–15)
AST: 27 U/L (ref 15–41)
Alkaline Phosphatase: 75 U/L (ref 38–126)
BILIRUBIN TOTAL: 1 mg/dL (ref 0.3–1.2)
BUN: 17 mg/dL (ref 6–20)
CHLORIDE: 99 mmol/L — AB (ref 101–111)
CO2: 25 mmol/L (ref 22–32)
Calcium: 8.2 mg/dL — ABNORMAL LOW (ref 8.9–10.3)
Creatinine, Ser: 2.27 mg/dL — ABNORMAL HIGH (ref 0.61–1.24)
GFR calc Af Amer: 31 mL/min — ABNORMAL LOW (ref >60)
GFR, EST NON AFRICAN AMERICAN: 27 mL/min — AB (ref >60)
GLUCOSE: 121 mg/dL — AB (ref 65–99)
POTASSIUM: 3.3 mmol/L — AB (ref 3.5–5.1)
Sodium: 136 mmol/L (ref 135–145)
TOTAL PROTEIN: 7.2 g/dL (ref 6.5–8.1)

## 2016-12-06 LAB — DIFFERENTIAL
BASOS PCT: 2 %
Basophils Absolute: 0.2 10*3/uL — ABNORMAL HIGH (ref 0.0–0.1)
EOS PCT: 0 %
Eosinophils Absolute: 0 10*3/uL (ref 0.0–0.7)
Lymphocytes Relative: 44 %
Lymphs Abs: 3.6 10*3/uL (ref 0.7–4.0)
MONO ABS: 1.9 10*3/uL — AB (ref 0.1–1.0)
Monocytes Relative: 24 %
Neutro Abs: 2.4 10*3/uL (ref 1.7–7.7)
Neutrophils Relative %: 30 %

## 2016-12-06 LAB — CBC
HEMATOCRIT: 30.7 % — AB (ref 39.0–52.0)
HEMOGLOBIN: 10.6 g/dL — AB (ref 13.0–17.0)
MCH: 30.7 pg (ref 26.0–34.0)
MCHC: 34.5 g/dL (ref 30.0–36.0)
MCV: 89 fL (ref 78.0–100.0)
Platelets: 443 10*3/uL — ABNORMAL HIGH (ref 150–400)
RBC: 3.45 MIL/uL — ABNORMAL LOW (ref 4.22–5.81)
RDW: 17.7 % — ABNORMAL HIGH (ref 11.5–15.5)
WBC: 8.7 10*3/uL (ref 4.0–10.5)

## 2016-12-06 LAB — LIPASE, BLOOD: LIPASE: 38 U/L (ref 11–51)

## 2016-12-06 MED ORDER — HYDROMORPHONE HCL 1 MG/ML IJ SOLN
1.0000 mg | Freq: Once | INTRAMUSCULAR | Status: AC
  Administered 2016-12-06: 1 mg via INTRAVENOUS
  Filled 2016-12-06: qty 1

## 2016-12-06 MED ORDER — ONDANSETRON HCL 4 MG/2ML IJ SOLN
4.0000 mg | Freq: Once | INTRAMUSCULAR | Status: AC
  Administered 2016-12-06: 4 mg via INTRAVENOUS
  Filled 2016-12-06: qty 2

## 2016-12-06 MED ORDER — ONDANSETRON HCL 4 MG/2ML IJ SOLN
4.0000 mg | Freq: Once | INTRAMUSCULAR | Status: AC
Start: 2016-12-06 — End: 2016-12-06
  Administered 2016-12-06: 4 mg via INTRAVENOUS
  Filled 2016-12-06: qty 2

## 2016-12-06 MED ORDER — SODIUM CHLORIDE 0.9 % IV SOLN
INTRAVENOUS | Status: DC
  Administered 2016-12-06: 21:00:00 via INTRAVENOUS

## 2016-12-06 MED ORDER — SODIUM CHLORIDE 0.9 % IV BOLUS (SEPSIS)
500.0000 mL | Freq: Once | INTRAVENOUS | Status: AC
  Administered 2016-12-06: 500 mL via INTRAVENOUS

## 2016-12-06 MED ORDER — ONDANSETRON 4 MG PO TBDP: 4.0000 mg | ORAL_TABLET | Freq: Three times a day (TID) | ORAL | 1 refills | Status: DC | PRN

## 2016-12-06 MED ORDER — HEPARIN SOD (PORK) LOCK FLUSH 100 UNIT/ML IV SOLN
INTRAVENOUS | Status: AC
  Administered 2016-12-06: 500 [IU]
  Filled 2016-12-06: qty 5

## 2016-12-06 NOTE — ED Triage Notes (Signed)
Patient c/o mid abdominal pain and vomiting. Patient is taking oral chemo pills.

## 2016-12-06 NOTE — ED Notes (Signed)
Wife signed for discharge for pt.

## 2016-12-06 NOTE — Discharge Instructions (Signed)
Follow-up with your hematology oncology doctor next week.  Call on Monday to let them know you are seen in.  Take the Zofran to help with the nausea.  Return for any new or worse symptoms.  CT scan of your abdomen had no acute findings.  Renal function is a little bit higher than baseline for you close follow-up with this will be important.

## 2016-12-06 NOTE — ED Notes (Signed)
Bed: NL27 Expected date:  Expected time:  Means of arrival:  Comments: TRIAGE 2

## 2016-12-06 NOTE — ED Provider Notes (Signed)
[facility] DEPT Provider Note   CSN: [id number] Arrival date & time: 12/06/16  1702     History   Chief Complaint Chief Complaint  Patient presents with  . Abdominal Pain  . Emesis  . chemo patient    HPI Calvin Mccormick is a 71 y.o. male.  Patient has a history of multiple myeloma it is not in remission.  Followed by hematology oncology.  Patient seems to be getting a month by them.  Patient is on oral chemo pills.  Patient with complaint of periumbilical abdominal pain and nausea since Wednesday.  No diarrhea.  Not vomiting any blood.      Past Medical History:  Diagnosis Date  . Anemia 11/27/2010  . Cancer ([place])   . Chronic renal insufficiency, stage III (moderate) ([place]) 06/17/2011   Due to myeloma & HTN  . CKD (chronic kidney disease), stage III ([place]) 02/05/2011  . 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 Diarrhea 06/17/2011   Hospital admission 05/30/11 velcade toxicity? Infectious?  Marland Kitchen Hyperlipemia   . Hypertension, benign essential, goal below 140/90 11/27/2010  . Hypokalemia with normal acid-base balance 07/29/2011  . Hypomagnesemia 07/29/2011  . Seasonal allergies     Patient Active Problem List   Diagnosis Date Noted  . Acute blood loss anemia 10/18/2016  . Aspiration pneumonia ([place]) 10/16/2016  . AKI (acute kidney injury) ([place]) 10/16/2016  . Upper GI bleed 10/16/2016  . Gait instability 05/22/2016  . Physical debility 05/22/2016  . Pancytopenia, acquired ([place]) 03/27/2016  . Goals of care, counseling/discussion 02/25/2016  . Port catheter in place 01/15/2016  . Acute neck pain 11/23/2015  . Renal failure 04/08/2015  . Acute renal failure (ARF) ([place]) 04/08/2015  . Acute renal failure superimposed on stage 3 chronic kidney disease ([place]) 04/08/2015  . Nausea and vomiting 04/08/2015  . Hypocalcemia 04/08/2015  . Pancytopenia due to antineoplastic chemotherapy ([place]) 11/15/2014  . 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 Protein calorie  malnutrition ([place]) 10/06/2014  . 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 Cellulitis and abscess of hand 08/05/2011  . Hypomagnesemia 07/29/2011  . Hypokalemia with normal acid-base balance 07/29/2011  . HTN (hypertension) 07/24/2011  . Anemia in chronic renal disease 07/24/2011  . Diarrhea 06/17/2011  . Chronic renal insufficiency, stage III (moderate) ([place]) 06/17/2011  . CKD (chronic kidney disease), stage III ([place]) 02/05/2011  . Multiple myeloma in relapse ([place]) 12/12/2010  . 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 COLONOSCOPY  07/26/2011   Procedure: COLONOSCOPY;  Surgeon: Cleotis Nipper, MD;  Location: [facility];  Service: Endoscopy;  Laterality: N/A;  . ESOPHAGOGASTRODUODENOSCOPY (EGD) WITH PROPOFOL Left 10/17/2016   Procedure: ESOPHAGOGASTRODUODENOSCOPY (EGD) WITH PROPOFOL;  Surgeon: Ronnette Juniper, MD;  Location: [facility] ENDOSCOPY;  Service: Gastroenterology;  Laterality: Left;  . EYE SURGERY     bilateral cataract  surgery  . MULTIPLE EXTRACTIONS WITH ALVEOLOPLASTY N/A 05/27/2014   Procedure: Extraction of tooth #'s 1,6,7,8,10,11,13,21,22,23,24,25,26,27 and 28 with alveoloplasty;  Surgeon: Lenn Cal, DDS;  Location: [facility] ORS;  Service: Oral Surgery;  Laterality: N/A;  . porta cath      for chemotherapy- right chest area       Home Medications    Prior to Admission medications   Medication Sig Start Date End Date Taking? Authorizing Provider  acyclovir (ZOVIRAX) 400 MG tablet Take 1 tablet (400 mg total) by mouth daily. 02/22/16  Yes Heath Lark, MD  allopurinol (ZYLOPRIM) 100 MG tablet  Take 100 mg by mouth 2 (two) times daily.  12/06/12  Yes [provider]  amLODipine (NORVASC) 10 MG tablet Take 10 mg by mouth daily. 07/19/16  Yes [provider]  aspirin EC 81 MG tablet Take 81 mg by mouth daily.   Yes [provider]  atorvastatin (LIPITOR) 20 MG tablet Take 20 mg by  mouth at bedtime.    Yes [provider]  brimonidine (ALPHAGAN) 0.15 % ophthalmic solution Place 1 drop into the right eye 3 (three) times daily.   Yes [provider]  cholecalciferol (VITAMIN D) 1000 UNITS tablet Take 1,000 Units by mouth daily.   Yes [provider]  dorzolamide-timolol (COSOPT) 22.3-6.8 MG/ML ophthalmic solution Place 1 drop into both eyes 2 (two) times daily.  07/29/13  Yes [provider]  fluorometholone (FML) 0.1 % ophthalmic suspension Place 1 drop into both eyes daily.   Yes [provider]  hydrochlorothiazide (HYDRODIURIL) 25 MG tablet Take 25 mg by mouth daily. 08/01/16  Yes [provider]  KLOR-CON M20 20 MEQ tablet Take 20 mEq by mouth daily. 09/18/16  Yes [provider]  latanoprost (XALATAN) 0.005 % ophthalmic solution Place 1 drop into the right eye at bedtime.   Yes [provider]  lidocaine-prilocaine (EMLA) cream Apply 1 application topically as needed. 05/19/14  Yes Gorsuch, Ni, MD  loperamide (IMODIUM A-D) 2 MG tablet 1 po after each watery BM.  Maximum 6 per 24hrs. 10/01/11  Yes Annia Belt, MD  ondansetron (ZOFRAN) 8 MG tablet Take 1 tablet (8 mg total) by mouth 2 (two) times daily as needed (Nausea or vomiting). 04/03/16  Yes Gorsuch, Ni, MD  pomalidomide (POMALYST) 2 MG capsule Take 1 capsule (2 mg total) by mouth daily. Take with water on days 1-21. Repeat every 28 days. 11/29/16  Yes Gorsuch, Ni, MD  lidocaine-prilocaine (EMLA) cream Apply to affected area once 02/22/16   Heath Lark, MD  ondansetron (ZOFRAN ODT) 4 MG disintegrating tablet Take 1 tablet (4 mg total) by mouth every 8 (eight) hours as needed for nausea or vomiting. 12/06/16   Fredia Sorrow, MD  pantoprazole (PROTONIX) 40 MG tablet Take 1 tablet (40 mg total) by mouth 2 (two) times daily before a meal. Patient not taking: Reported on 12/06/2016 10/18/16   Donne Hazel, MD  prochlorperazine (COMPAZINE) 10 MG  tablet Take 1 tablet (10 mg total) by mouth every 6 (six) hours as needed (Nausea or vomiting). Patient not taking: Reported on 12/06/2016 02/22/16   Heath Lark, MD  sucralfate (CARAFATE) 1 GM/10ML suspension Take 10 mLs (1 g total) by mouth 2 (two) times daily. Patient not taking: Reported on 12/06/2016 10/18/16   Donne Hazel, MD    Family History Family History  Problem Relation Age of Onset  . Cancer Brother        lung ca    Social History Social History   Tobacco Use  . Smoking status: Never Smoker  . Smokeless tobacco: Never Used  Substance Use Topics  . Alcohol use: No  . Drug use: No     Allergies   Lactose intolerance (gi)   Review of Systems Review of Systems  Constitutional: Negative for fever.  HENT: Negative for congestion.   Respiratory: Negative for shortness of breath.   Cardiovascular: Negative for chest pain.  Gastrointestinal: Positive for abdominal pain, nausea and vomiting. Negative for diarrhea.  Genitourinary: Negative for dysuria.  Musculoskeletal: Negative for back pain.  Skin: Negative for  rash.  Neurological: Negative for headaches.  Hematological: Does not bruise/bleed easily.  Psychiatric/Behavioral: Negative for confusion.     Physical Exam Updated Vital Signs BP 131/68   Pulse 77   Temp 98.7 F (37.1 C) (Oral)   Resp 20   Ht 1.803 m ('5\' 11"' )   Wt 71.2 kg (157 lb)   SpO2 100%   BMI 21.90 kg/m   Physical Exam  Constitutional: He is oriented to person, place, and time. He appears well-developed and well-nourished. No distress.  HENT:  Head: Normocephalic and atraumatic.  Mouth/Throat: Oropharynx is clear and moist.  Eyes: Conjunctivae and EOM are normal. Pupils are equal, round, and reactive to light.  Neck: Normal range of motion. Neck supple.  Cardiovascular: Normal rate, regular rhythm and normal heart sounds.  Pulmonary/Chest: Effort normal and breath sounds normal.  Abdominal: Soft. Bowel sounds are normal. He  exhibits no distension and no mass. There is no tenderness.  Musculoskeletal: Normal range of motion.  Neurological: He is alert and oriented to person, place, and time. No cranial nerve deficit or sensory deficit. He exhibits normal muscle tone. Coordination normal.  Skin: Skin is warm.  Nursing note and vitals reviewed.    ED Treatments / Results  Labs (all labs ordered are listed, but only abnormal results are displayed) Labs Reviewed  COMPREHENSIVE METABOLIC PANEL - Abnormal; Notable for the following components:      Result Value   Potassium 3.3 (*)    Chloride 99 (*)    Glucose, Bld 121 (*)    Creatinine, Ser 2.27 (*)    Calcium 8.2 (*)    ALT 15 (*)    GFR calc non Af Amer 27 (*)    GFR calc Af Amer 31 (*)    All other components within normal limits  CBC - Abnormal; Notable for the following components:   RBC 3.45 (*)    Hemoglobin 10.6 (*)    HCT 30.7 (*)    RDW 17.7 (*)    Platelets 443 (*)    All other components within normal limits  URINALYSIS, ROUTINE W REFLEX MICROSCOPIC - Abnormal; Notable for the following components:   Ketones, ur 5 (*)    Protein, ur 30 (*)    Bacteria, UA RARE (*)    Squamous Epithelial / LPF 0-5 (*)    All other components within normal limits  DIFFERENTIAL - Abnormal; Notable for the following components:   Monocytes Absolute 1.9 (*)    Basophils Absolute 0.2 (*)    All other components within normal limits  LIPASE, BLOOD    EKG  EKG Interpretation None       Radiology Ct Abdomen Pelvis Wo Contrast  Result Date: 12/06/2016 CLINICAL DATA:  Left-sided abdominal pain and vomiting for 3 days. Fever. Currently undergoing chemotherapy for multiple myeloma. EXAM: CT ABDOMEN AND PELVIS WITHOUT CONTRAST TECHNIQUE: Multidetector CT imaging of the abdomen and pelvis was performed following the standard protocol without IV contrast. COMPARISON:  04/08/2015 FINDINGS: Lower chest: No acute findings. Hepatobiliary: No mass visualized on  this unenhanced exam. Gallbladder is unremarkable. Pancreas: No mass or inflammatory process visualized on this unenhanced exam. Spleen:  Within normal limits in size. Adrenals/Urinary tract: No evidence of urolithiasis or hydronephrosis. Stable fluid attenuation left renal cyst. Mild diffuse bladder wall thickening, likely due to chronic bladder outlet obstruction given enlarged prostate. Stomach/Bowel: Small hiatal hernia is unchanged. No evidence of obstruction, inflammatory process, or abnormal fluid collections. Normal appendix visualized. Sigmoid diverticulosis again noted,  without evidence of diverticulitis. Vascular/Lymphatic: No pathologically enlarged lymph nodes identified. No evidence of abdominal aortic aneurysm. Reproductive: Mildly enlarged prostate which indents the bladder base. Other:  None. Musculoskeletal: No suspicious bone lesions identified. Severe lumbar spine degenerative changes and levoscoliosis. IMPRESSION: No acute findings. 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 Stable small hiatal hernia. Colonic diverticulosis, without radiographic evidence of diverticulitis. Electronically Signed   By: Earle Gell M.D.   On: 12/06/2016 21:56    Procedures Procedures (including critical care time)  Medications Ordered in ED Medications  0.9 %  sodium chloride infusion ( Intravenous New Bag/Given 12/06/16 2035)  sodium chloride 0.9 % bolus 500 mL (0 mLs Intravenous Stopped 12/06/16 2100)  ondansetron (ZOFRAN) injection 4 mg (4 mg Intravenous Given 12/06/16 2035)  HYDROmorphone (DILAUDID) injection 1 mg (1 mg Intravenous Given 12/06/16 2036)     Initial Impression / Assessment and Plan / ED Course  I have reviewed the triage vital signs and the nursing notes.  Pertinent labs & imaging results that were available during my care of the patient were reviewed by me and considered in my medical decision making (see chart for details).    Review of  previous labs and hematology oncology notes.  Patient had difficulty with anemia.  But hemoglobin reasonable tonight.  Patient with slightly low potassium and increased BUN and creatinine a little above his baseline.  CT scan negative for any acute findings.  Patient improved here with antinausea medicine and fluid.  Patient will need close follow-up by hematology oncology states he is got an appointment next week already.  He will call them to have them check his labs.  Will need close follow-up of the renal function.  Patient stable for discharge home.  States he does not have antinausea medicine at home we will give him Zofran.  Will re-dose of Zofran here prior to discharge.   Final Clinical Impressions(s) / ED Diagnoses   Final diagnoses:  Non-intractable vomiting with nausea, unspecified vomiting type  Periumbilical abdominal pain  Multiple myeloma not having achieved remission [facility])    ED Discharge Orders        Ordered    ondansetron (ZOFRAN ODT) 4 MG disintegrating tablet  Every 8 hours PRN     12/06/16 2315       Fredia Sorrow, MD 12/06/16 2324

## 2016-12-06 NOTE — ED Notes (Signed)
Pt aware we need a urine sample. Urinal at bedside.

## 2016-12-12 ENCOUNTER — Telehealth: Payer: Self-pay

## 2016-12-12 ENCOUNTER — Ambulatory Visit: Payer: Medicare Other

## 2016-12-12 ENCOUNTER — Other Ambulatory Visit: Payer: Self-pay | Admitting: Hematology and Oncology

## 2016-12-12 ENCOUNTER — Telehealth: Payer: Self-pay | Admitting: Hematology and Oncology

## 2016-12-12 ENCOUNTER — Ambulatory Visit (HOSPITAL_BASED_OUTPATIENT_CLINIC_OR_DEPARTMENT_OTHER): Payer: Medicare Other

## 2016-12-12 ENCOUNTER — Encounter: Payer: Self-pay | Admitting: Hematology and Oncology

## 2016-12-12 ENCOUNTER — Ambulatory Visit (HOSPITAL_BASED_OUTPATIENT_CLINIC_OR_DEPARTMENT_OTHER): Payer: Medicare Other | Admitting: Hematology and Oncology

## 2016-12-12 VITALS — BP 114/63 | HR 79 | Temp 97.9°F | Resp 16

## 2016-12-12 DIAGNOSIS — N183 Chronic kidney disease, stage 3 unspecified: Secondary | ICD-10-CM

## 2016-12-12 DIAGNOSIS — C9002 Multiple myeloma in relapse: Secondary | ICD-10-CM | POA: Diagnosis not present

## 2016-12-12 DIAGNOSIS — R5381 Other malaise: Secondary | ICD-10-CM

## 2016-12-12 DIAGNOSIS — R112 Nausea with vomiting, unspecified: Secondary | ICD-10-CM

## 2016-12-12 DIAGNOSIS — N2889 Other specified disorders of kidney and ureter: Secondary | ICD-10-CM

## 2016-12-12 DIAGNOSIS — R197 Diarrhea, unspecified: Secondary | ICD-10-CM

## 2016-12-12 DIAGNOSIS — K209 Esophagitis, unspecified without bleeding: Secondary | ICD-10-CM | POA: Insufficient documentation

## 2016-12-12 DIAGNOSIS — D631 Anemia in chronic kidney disease: Secondary | ICD-10-CM

## 2016-12-12 DIAGNOSIS — Z95828 Presence of other vascular implants and grafts: Secondary | ICD-10-CM

## 2016-12-12 LAB — CBC WITH DIFFERENTIAL/PLATELET
BASO%: 1 % (ref 0.0–2.0)
BASOS ABS: 0.1 10*3/uL (ref 0.0–0.1)
EOS ABS: 0.1 10*3/uL (ref 0.0–0.5)
EOS%: 0.5 % (ref 0.0–7.0)
HCT: 31.1 % — ABNORMAL LOW (ref 38.4–49.9)
HGB: 10.7 g/dL — ABNORMAL LOW (ref 13.0–17.1)
LYMPH%: 27.6 % (ref 14.0–49.0)
MCH: 30.8 pg (ref 27.2–33.4)
MCHC: 34.4 g/dL (ref 32.0–36.0)
MCV: 89.6 fL (ref 79.3–98.0)
MONO#: 0.4 10*3/uL (ref 0.1–0.9)
MONO%: 3.5 % (ref 0.0–14.0)
NEUT#: 6.7 10*3/uL — ABNORMAL HIGH (ref 1.5–6.5)
NEUT%: 67.4 % (ref 39.0–75.0)
NRBC: 0 % (ref 0–0)
PLATELETS: 431 10*3/uL — AB (ref 140–400)
RBC: 3.47 10*6/uL — ABNORMAL LOW (ref 4.20–5.82)
RDW: 17.6 % — AB (ref 11.0–14.6)
WBC: 9.9 10*3/uL (ref 4.0–10.3)
lymph#: 2.7 10*3/uL (ref 0.9–3.3)

## 2016-12-12 LAB — COMPREHENSIVE METABOLIC PANEL
ALBUMIN: 3.4 g/dL — AB (ref 3.5–5.0)
ALK PHOS: 77 U/L (ref 40–150)
ALT: 10 U/L (ref 0–55)
AST: 15 U/L (ref 5–34)
Anion Gap: 15 mEq/L — ABNORMAL HIGH (ref 3–11)
BUN: 16 mg/dL (ref 7.0–26.0)
CHLORIDE: 98 meq/L (ref 98–109)
CO2: 24 mEq/L (ref 22–29)
Calcium: 9.6 mg/dL (ref 8.4–10.4)
Creatinine: 2.4 mg/dL — ABNORMAL HIGH (ref 0.7–1.3)
EGFR: 30 mL/min/{1.73_m2} — ABNORMAL LOW (ref >60)
GLUCOSE: 103 mg/dL (ref 70–140)
POTASSIUM: 3.5 meq/L (ref 3.5–5.1)
SODIUM: 136 meq/L (ref 136–145)
Total Bilirubin: 0.78 mg/dL (ref 0.20–1.20)
Total Protein: 7.2 g/dL (ref 6.4–8.3)

## 2016-12-12 LAB — TECHNOLOGIST REVIEW

## 2016-12-12 MED ORDER — SODIUM CHLORIDE 0.9% FLUSH
10.0000 mL | Freq: Once | INTRAVENOUS | Status: AC
  Administered 2016-12-12: 10 mL
  Filled 2016-12-12: qty 10

## 2016-12-12 MED ORDER — DIPHENHYDRAMINE HCL 25 MG PO CAPS
50.0000 mg | ORAL_CAPSULE | Freq: Once | ORAL | Status: AC
  Administered 2016-12-12: 50 mg via ORAL

## 2016-12-12 MED ORDER — PROCHLORPERAZINE EDISYLATE 5 MG/ML IJ SOLN
10.0000 mg | Freq: Once | INTRAMUSCULAR | Status: AC
  Administered 2016-12-12: 10 mg via INTRAVENOUS

## 2016-12-12 MED ORDER — SODIUM CHLORIDE 0.9 % IV SOLN
Freq: Once | INTRAVENOUS | Status: AC
  Administered 2016-12-12: 10:00:00 via INTRAVENOUS

## 2016-12-12 MED ORDER — SODIUM CHLORIDE 0.9 % IV SOLN
16.6000 mg/kg | Freq: Once | INTRAVENOUS | Status: AC
  Administered 2016-12-12: 1100 mg via INTRAVENOUS
  Filled 2016-12-12: qty 15

## 2016-12-12 MED ORDER — ACETAMINOPHEN 325 MG PO TABS
ORAL_TABLET | ORAL | Status: AC
  Filled 2016-12-12: qty 2

## 2016-12-12 MED ORDER — PANTOPRAZOLE SODIUM 40 MG PO TBEC: 40.0000 mg | DELAYED_RELEASE_TABLET | Freq: Two times a day (BID) | ORAL | 9 refills | Status: DC

## 2016-12-12 MED ORDER — SODIUM CHLORIDE 0.9% FLUSH
10.0000 mL | INTRAVENOUS | Status: DC | PRN
  Administered 2016-12-12: 10 mL
  Filled 2016-12-12: qty 10

## 2016-12-12 MED ORDER — SUCRALFATE 1 GM/10ML PO SUSP: 1.0000 g | Freq: Two times a day (BID) | ORAL | 1 refills | Status: DC

## 2016-12-12 MED ORDER — DIPHENHYDRAMINE HCL 25 MG PO CAPS
ORAL_CAPSULE | ORAL | Status: AC
  Filled 2016-12-12: qty 2

## 2016-12-12 MED ORDER — PROCHLORPERAZINE EDISYLATE 5 MG/ML IJ SOLN
INTRAMUSCULAR | Status: AC
  Filled 2016-12-12: qty 2

## 2016-12-12 MED ORDER — METHYLPREDNISOLONE SODIUM SUCC 40 MG IJ SOLR
INTRAMUSCULAR | Status: AC
  Filled 2016-12-12: qty 1

## 2016-12-12 MED ORDER — ACETAMINOPHEN 325 MG PO TABS
650.0000 mg | ORAL_TABLET | Freq: Once | ORAL | Status: AC
  Administered 2016-12-12: 650 mg via ORAL

## 2016-12-12 MED ORDER — SODIUM CHLORIDE 0.9 % IV SOLN: 16.0000 mg/kg | Freq: Once | INTRAVENOUS | Status: DC

## 2016-12-12 MED ORDER — METHYLPREDNISOLONE SODIUM SUCC 40 MG IJ SOLR
40.0000 mg | Freq: Once | INTRAMUSCULAR | Status: AC
  Administered 2016-12-12: 40 mg via INTRAVENOUS

## 2016-12-12 MED ORDER — HEPARIN SOD (PORK) LOCK FLUSH 100 UNIT/ML IV SOLN
500.0000 [IU] | Freq: Once | INTRAVENOUS | Status: AC | PRN
  Administered 2016-12-12: 500 [IU]
  Filled 2016-12-12: qty 5

## 2016-12-12 NOTE — Assessment & Plan Note (Signed)
The patient is very weak He is at risk of fall I will get advanced home care RN to check on the patient and also home PT for further assessment

## 2016-12-12 NOTE — Assessment & Plan Note (Signed)
We reviewed his recent blood work Given progression of disease, he was started on Pomalyst on 08/14/2016. Clinically, he appears to be responding very well to treatment  He will continue treatment with Pomalyst 3 weeks on 1 week off. He will continue Daratumumab as scheduled every 4 weeks He is reminded to take calcium with vitamin D He will get Zometa every 6 months

## 2016-12-12 NOTE — Telephone Encounter (Signed)
Pam called from [place] care and left message. Patient is out of service area for referral, he lives in [place].

## 2016-12-12 NOTE — Assessment & Plan Note (Signed)
The patient was diagnosed with severe acute esophagitis in October 2018 He had no GI follow-up He ran out of his prescription of Protonix and Carafate a month ago and did not have any refills I reinforced the importance of taking his medications on an ongoing basis I refilled both prescription I will make a GI referral for the patient for further follow-up to see if repeat EGD is indicated The patient also has ongoing diarrhea and I also recommend consideration for colonoscopy

## 2016-12-12 NOTE — Assessment & Plan Note (Signed)
The patient have chronic diarrhea I recommend increase Imodium as needed Recent CT scan was unremarkable I would refer him to GI service to consider colonoscopy

## 2016-12-12 NOTE — Assessment & Plan Note (Signed)
He has multifactorial anemia, combination of anemia chronic kidney disease and recent blood loss/iron deficiency anemia His hemoglobin is stable.  Continue close observation

## 2016-12-12 NOTE — Telephone Encounter (Signed)
Per 12/6 los Return for No new orders or return visit.

## 2016-12-12 NOTE — Telephone Encounter (Signed)
Called with referral to [facility].

## 2016-12-12 NOTE — Patient Instructions (Signed)
Implanted Port Home Guide An implanted port is a type of central line that is placed under the skin. Central lines are used to provide IV access when treatment or nutrition needs to be given through a person's veins. Implanted ports are used for long-term IV access. An implanted port may be placed because:  You need IV medicine that would be irritating to the small veins in your hands or arms.  You need long-term IV medicines, such as antibiotics.  You need IV nutrition for a long period.  You need frequent blood draws for lab tests.  You need dialysis.  Implanted ports are usually placed in the chest area, but they can also be placed in the upper arm, the abdomen, or the leg. An implanted port has two main parts:  Reservoir. The reservoir is round and will appear as a small, raised area under your skin. The reservoir is the part where a needle is inserted to give medicines or draw blood.  Catheter. The catheter is a thin, flexible tube that extends from the reservoir. The catheter is placed into a large vein. Medicine that is inserted into the reservoir goes into the catheter and then into the vein.  How will I care for my incision site? Do not get the incision site wet. Bathe or shower as directed by your health care provider. How is my port accessed? Special steps must be taken to access the port:  Before the port is accessed, a numbing cream can be placed on the skin. This helps numb the skin over the port site.  Your health care provider uses a sterile technique to access the port. ? Your health care provider must put on a mask and sterile gloves. ? The skin over your port is cleaned carefully with an antiseptic and allowed to dry. ? The port is gently pinched between sterile gloves, and a needle is inserted into the port.  Only "non-coring" port needles should be used to access the port. Once the port is accessed, a blood return should be checked. This helps ensure that the port  is in the vein and is not clogged.  If your port needs to remain accessed for a constant infusion, a clear (transparent) bandage will be placed over the needle site. The bandage and needle will need to be changed every week, or as directed by your health care provider.  Keep the bandage covering the needle clean and dry. Do not get it wet. Follow your health care provider's instructions on how to take a shower or bath while the port is accessed.  If your port does not need to stay accessed, no bandage is needed over the port.  What is flushing? Flushing helps keep the port from getting clogged. Follow your health care provider's instructions on how and when to flush the port. Ports are usually flushed with saline solution or a medicine called heparin. The need for flushing will depend on how the port is used.  If the port is used for intermittent medicines or blood draws, the port will need to be flushed: ? After medicines have been given. ? After blood has been drawn. ? As part of routine maintenance.  If a constant infusion is running, the port may not need to be flushed.  How long will my port stay implanted? The port can stay in for as long as your health care provider thinks it is needed. When it is time for the port to come out, surgery will be   done to remove it. The procedure is similar to the one performed when the port was put in. When should I seek immediate medical care? When you have an implanted port, you should seek immediate medical care if:  You notice a bad smell coming from the incision site.  You have swelling, redness, or drainage at the incision site.  You have more swelling or pain at the port site or the surrounding area.  You have a fever that is not controlled with medicine.  This information is not intended to replace advice given to you by your health care provider. Make sure you discuss any questions you have with your health care provider. Document  Released: 12/24/2004 Document Revised: 06/01/2015 Document Reviewed: 08/31/2012 [employer] Interactive Patient Education  2017 [employer]  

## 2016-12-12 NOTE — Assessment & Plan Note (Signed)
He had recent acute on chronic renal failure secondary to poor oral intake, nausea and dehydration I recommend discontinuation of hydrochlorothiazide and potassium supplements I recommend increase oral fluid intake as tolerated I recommend he takes higher dose Imodium due to his ongoing diarrhea I will get advanced home care nursing to check on the patient and administer home IV fluids as needed to prevent dehydration

## 2016-12-12 NOTE — Patient Instructions (Signed)
[facility] Discharge Instructions for Patients Receiving Chemotherapy  Today you received the following chemotherapy agents:  Darzalex (daratumumab)  To help prevent nausea and vomiting after your treatment, we encourage you to take your nausea medication as prescribed.   If you develop nausea and vomiting that is not controlled by your nausea medication, call the clinic.   BELOW ARE SYMPTOMS THAT SHOULD BE REPORTED IMMEDIATELY:  *FEVER GREATER THAN 100.5 F  *CHILLS WITH OR WITHOUT FEVER  NAUSEA AND VOMITING THAT IS NOT CONTROLLED WITH YOUR NAUSEA MEDICATION  *UNUSUAL SHORTNESS OF BREATH  *UNUSUAL BRUISING OR BLEEDING  TENDERNESS IN MOUTH AND THROAT WITH OR WITHOUT PRESENCE OF ULCERS  *URINARY PROBLEMS  *BOWEL PROBLEMS  UNUSUAL RASH Items with * indicate a potential emergency and should be followed up as soon as possible.  Feel free to call the clinic should you have any questions or concerns. The clinic phone number is [phone].  Please show the CHEMO ALERT CARD at check-in to the Emergency Department and triage nurse.   

## 2016-12-12 NOTE — Telephone Encounter (Signed)
Pls call his wife and ask if she is comfortable to wait until next month to see me or I can bring him back for quick visit in 2 weeks with labs?

## 2016-12-12 NOTE — Progress Notes (Signed)
Per Dr Alvy Bimler, [place] to treat with CRT of 2.4.  Pt to get IV compazine instead of oral d/t n/v.

## 2016-12-12 NOTE — Telephone Encounter (Signed)
Called referral to [facility] GI. Faxed referral to [phone].

## 2016-12-12 NOTE — Progress Notes (Signed)
[facility] OFFICE PROGRESS NOTE  Patient Care Team: Lucianne Lei, MD as PCP - General (Family Medicine) Heath Lark, MD as Consulting Physician (Hematology and Oncology) Pleasant, [name], RN as [facility] Management  SUMMARY OF ONCOLOGIC HISTORY:   Multiple myeloma in relapse [facility])   12/12/2010 Initial Diagnosis    Multiple myeloma      02/16/2014 Imaging    Skeletal survey showed diffuse osteopenia      03/02/2014 Bone Marrow Biopsy    Accession: [id number] BM biopsy showed only 5 % plasma cells. However, the biopsy was difficult and the bone was fragmented during the procedure. Cytogenetics and FISH study is normal      03/03/2014 Procedure    he has port placement      03/10/2014 - 05/19/2014 Chemotherapy    he received elotuzumab and revlimid      05/20/2014 - 02/25/2016 Chemotherapy    Treatment is switched to maintenance Revlimid only. Treatment is stopped due to progressive disease      11/15/2014 Adverse Reaction    He has mild worsening anemia. Does of Revlimid reduced to 5 mg 21 days on, 7 days off      03/06/2016 -  Chemotherapy    He received Daratumumab and Velcade. Velcade is stopped on 07/24/16      08/14/2016 Miscellaneous    Pomalyst is added along with Daratumumab      10/17/2016 Surgery    EGD was performed for coffee-ground emesis. Multiple linear superficial ulcers were noted in the esophagus from 25-35 cm from insertion. Severe esophagitis was noted from 30-35 cm from insertion. Multiple biopsies were obtained. A small hiatal hernia was noted. The gastric cavity contained coffee-ground fluid, after suctioning and lavage, no obvious gastric lesions/erosion/ulceration/erythema was noted. Biopsies were taken from the antrum to rule out H. Pylori. Duodenum bulb appeared unremarkable. A small diverticulum was noted in second portion of the duodenum.        12/06/2016 Imaging    Ct scan abdomen No acute  findings.  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  Stable small hiatal hernia.  Colonic diverticulosis, without radiographic evidence of diverticulitis.       INTERVAL HISTORY: Please see below for problem oriented charting. He returns for further follow-up He was in the emergency department recently due to uncontrolled nausea and vomiting due to poor oral intake and ongoing diarrhea He has not seen the GI service since October He ran out of his prescription of Protonix and Carafate He was able to take his chemotherapy as indicated No recent fever or chills He is weak and overall quite debilitated from recent illness  REVIEW OF SYSTEMS:   Constitutional: Denies fevers, chills or abnormal weight loss Eyes: Denies blurriness of vision Ears, nose, mouth, throat, and face: Denies mucositis or sore throat Respiratory: Denies cough, dyspnea or wheezes Cardiovascular: Denies palpitation, chest discomfort or lower extremity swelling Skin: Denies abnormal skin rashes Lymphatics: Denies new lymphadenopathy or easy bruising Behavioral/Psych: Mood is stable, no new changes  All other systems were reviewed with the patient and are negative.  I have reviewed the past medical history, past surgical history, social history and family history with the patient and they are unchanged from previous note.  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  MEDICATIONS:  Current Outpatient Medications  Medication Sig Dispense Refill  . acyclovir (ZOVIRAX) 400 MG tablet Take 1 tablet (400 mg total) by mouth daily. 60 tablet 11  .  allopurinol (ZYLOPRIM) 100 MG tablet Take 100 mg by mouth 2 (two) times daily.     Marland Kitchen amLODipine (NORVASC) 10 MG tablet Take 10 mg by mouth daily.  1  . aspirin EC 81 MG tablet Take 81 mg by mouth daily.    Marland Kitchen atorvastatin (LIPITOR) 20 MG tablet Take 20 mg by mouth at bedtime.     . brimonidine (ALPHAGAN) 0.15 % ophthalmic  solution Place 1 drop into the right eye 3 (three) times daily.    . cholecalciferol (VITAMIN D) 1000 UNITS tablet Take 1,000 Units by mouth daily.    . dorzolamide-timolol (COSOPT) 22.3-6.8 MG/ML ophthalmic solution Place 1 drop into both eyes 2 (two) times daily.     . fluorometholone (FML) 0.1 % ophthalmic suspension Place 1 drop into both eyes daily.    Marland Kitchen latanoprost (XALATAN) 0.005 % ophthalmic solution Place 1 drop into the right eye at bedtime.    . lidocaine-prilocaine (EMLA) cream Apply to affected area once 30 g 3  . loperamide (IMODIUM A-D) 2 MG tablet 1 po after each watery BM.  Maximum 6 per 24hrs. 30 tablet PRN  . ondansetron (ZOFRAN ODT) 4 MG disintegrating tablet Take 1 tablet (4 mg total) by mouth every 8 (eight) hours as needed for nausea or vomiting. 20 tablet 1  . ondansetron (ZOFRAN) 8 MG tablet Take 1 tablet (8 mg total) by mouth 2 (two) times daily as needed (Nausea or vomiting). 30 tablet 1  . pantoprazole (PROTONIX) 40 MG tablet Take 1 tablet (40 mg total) by mouth 2 (two) times daily before a meal. 60 tablet 9  . pomalidomide (POMALYST) 2 MG capsule Take 1 capsule (2 mg total) by mouth daily. Take with water on days 1-21. Repeat every 28 days. 21 capsule 0  . prochlorperazine (COMPAZINE) 10 MG tablet Take 1 tablet (10 mg total) by mouth every 6 (six) hours as needed (Nausea or vomiting). (Patient not taking: Reported on 12/06/2016) 30 tablet 1  . sucralfate (CARAFATE) 1 GM/10ML suspension Take 10 mLs (1 g total) by mouth 2 (two) times daily. 420 mL 1   No current facility-administered medications for this visit.     PHYSICAL EXAMINATION: ECOG PERFORMANCE STATUS: 2 - Symptomatic, <50% confined to bed  Vitals:   12/12/16 0937  BP: 127/70  Pulse: 88  Resp: 17  Temp: 98 F (36.7 C)  SpO2: 100%   Filed Weights   12/12/16 0937  Weight: 145 lb 1.6 oz (65.8 kg)    GENERAL:alert, no distress and comfortable SKIN: skin color, texture, turgor are normal, no rashes or  significant lesions EYES: normal, Conjunctiva are pink and non-injected, sclera clear OROPHARYNX:no exudate, no erythema and lips, buccal mucosa, and tongue normal  NECK: supple, thyroid normal size, non-tender, without nodularity LYMPH:  no palpable lymphadenopathy in the cervical, axillary or inguinal LUNGS: clear to auscultation and percussion with normal breathing effort HEART: regular rate & rhythm and no murmurs and no lower extremity edema ABDOMEN:abdomen soft, non-tender and normal bowel sounds Musculoskeletal:no cyanosis of digits and no clubbing  NEURO: alert & oriented x 3 with fluent speech, no focal motor/sensory deficits  LABORATORY DATA:  I have reviewed the data as listed    Component Value Date/Time   NA 136 12/12/2016 0822   K 3.5 12/12/2016 0822   CL 99 (L) 12/06/2016 1824   CL 107 06/29/2012 1131   CO2 24 12/12/2016 0822   GLUCOSE 103 12/12/2016 0822   GLUCOSE 143 (H) 06/29/2012 1131  BUN 16.0 12/12/2016 0822   CREATININE 2.4 (H) 12/12/2016 0822   CALCIUM 9.6 12/12/2016 0822   PROT 7.2 12/12/2016 0822   ALBUMIN 3.4 (L) 12/12/2016 0822   AST 15 12/12/2016 0822   ALT 10 12/12/2016 0822   ALKPHOS 77 12/12/2016 0822   BILITOT 0.78 12/12/2016 0822   GFRNONAA 27 (L) 12/06/2016 1824   GFRAA 31 (L) 12/06/2016 1824    No results found for: SPEP, UPEP  Lab Results  Component Value Date   WBC 9.9 12/12/2016   NEUTROABS 6.7 (H) 12/12/2016   HGB 10.7 (L) 12/12/2016   HCT 31.1 (L) 12/12/2016   MCV 89.6 12/12/2016   PLT 431 (H) 12/12/2016      Chemistry      Component Value Date/Time   NA 136 12/12/2016 0822   K 3.5 12/12/2016 0822   CL 99 (L) 12/06/2016 1824   CL 107 06/29/2012 1131   CO2 24 12/12/2016 0822   BUN 16.0 12/12/2016 0822   CREATININE 2.4 (H) 12/12/2016 0822      Component Value Date/Time   CALCIUM 9.6 12/12/2016 0822   ALKPHOS 77 12/12/2016 0822   AST 15 12/12/2016 0822   ALT 10 12/12/2016 0822   BILITOT 0.78 12/12/2016 0822        RADIOGRAPHIC STUDIES: I have personally reviewed the radiological images as listed and agreed with the findings in the report. Ct Abdomen Pelvis Wo Contrast  Result Date: 12/06/2016 CLINICAL DATA:  Left-sided abdominal pain and vomiting for 3 days. Fever. Currently undergoing chemotherapy for multiple myeloma. EXAM: CT ABDOMEN AND PELVIS WITHOUT CONTRAST TECHNIQUE: Multidetector CT imaging of the abdomen and pelvis was performed following the standard protocol without IV contrast. COMPARISON:  04/08/2015 FINDINGS: Lower chest: No acute findings. Hepatobiliary: No mass visualized on this unenhanced exam. Gallbladder is unremarkable. Pancreas: No mass or inflammatory process visualized on this unenhanced exam. Spleen:  Within normal limits in size. Adrenals/Urinary tract: No evidence of urolithiasis or hydronephrosis. Stable fluid attenuation left renal cyst. Mild diffuse bladder wall thickening, likely due to chronic bladder outlet obstruction given enlarged prostate. Stomach/Bowel: Small hiatal hernia is unchanged. No evidence of obstruction, inflammatory process, or abnormal fluid collections. Normal appendix visualized. Sigmoid diverticulosis again noted, without evidence of diverticulitis. Vascular/Lymphatic: No pathologically enlarged lymph nodes identified. No evidence of abdominal aortic aneurysm. Reproductive: Mildly enlarged prostate which indents the bladder base. Other:  None. Musculoskeletal: No suspicious bone lesions identified. Severe lumbar spine degenerative changes and levoscoliosis. IMPRESSION: No acute findings. 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 Stable small hiatal hernia. Colonic diverticulosis, without radiographic evidence of diverticulitis. Electronically Signed   By: Earle Gell M.D.   On: 12/06/2016 21:56    ASSESSMENT & PLAN:  Multiple myeloma in relapse [facility]) We reviewed his recent blood work Given progression of disease, he was  started on Pomalyst on 08/14/2016. Clinically, he appears to be responding very well to treatment  He will continue treatment with Pomalyst 3 weeks on 1 week off. He will continue Daratumumab as scheduled every 4 weeks He is reminded to take calcium with vitamin D He will get Zometa every 6 months  Anemia in chronic renal disease He has multifactorial anemia, combination of anemia chronic kidney disease and recent blood loss/iron deficiency anemia His hemoglobin is stable.  Continue close observation  Chronic renal insufficiency, stage III (moderate) He had recent acute on chronic renal failure secondary to poor oral intake, nausea and dehydration I recommend discontinuation of hydrochlorothiazide and potassium  supplements I recommend increase oral fluid intake as tolerated I recommend he takes higher dose Imodium due to his ongoing diarrhea I will get advanced home care nursing to check on the patient and administer home IV fluids as needed to prevent dehydration  Physical debility The patient is very weak He is at risk of fall I will get advanced home care RN to check on the patient and also home PT for further assessment  Esophagitis, acute The patient was diagnosed with severe acute esophagitis in October 2018 He had no GI follow-up He ran out of his prescription of Protonix and Carafate a month ago and did not have any refills I reinforced the importance of taking his medications on an ongoing basis I refilled both prescription I will make a GI referral for the patient for further follow-up to see if repeat EGD is indicated The patient also has ongoing diarrhea and I also recommend consideration for colonoscopy  Diarrhea The patient have chronic diarrhea I recommend increase Imodium as needed Recent CT scan was unremarkable I would refer him to GI service to consider colonoscopy   Orders Placed This Encounter  Procedures  . Ambulatory referral to Gastroenterology     Referral Priority:   Routine    Referral Type:   Consultation    Referral Reason:   Specialty Services Required    Referred to Provider:   Ronnette Juniper, MD    Requested Specialty:   Gastroenterology    Number of Visits Requested:   1  . Ambulatory referral to Home Health    Referral Priority:   Routine    Referral Type:   Home Health Care    Referral Reason:   Specialty Services Required    Requested Specialty:   [place]    Number of Visits Requested:   1   All questions were answered. The patient knows to call the clinic with any problems, questions or concerns. No barriers to learning was detected. I spent 25 minutes counseling the patient face to face. The total time spent in the appointment was 40 minutes and more than 50% was on counseling and review of test results     Heath Lark, MD 12/12/2016 10:02 AM

## 2016-12-12 NOTE — Telephone Encounter (Signed)
Called regarding below message. They would like to come back in 2 weeks with labs.

## 2016-12-13 LAB — KAPPA/LAMBDA LIGHT CHAINS
IG KAPPA FREE LIGHT CHAIN: 98 mg/L — AB (ref 3.3–19.4)
IG LAMBDA FREE LIGHT CHAIN: 10.1 mg/L (ref 5.7–26.3)
Kappa/Lambda FluidC Ratio: 9.7 — ABNORMAL HIGH (ref 0.26–1.65)

## 2016-12-13 NOTE — Telephone Encounter (Signed)
I sent scheduling msg to see him in 2 weeks

## 2016-12-14 LAB — MULTIPLE MYELOMA PANEL, SERUM
ALBUMIN SERPL ELPH-MCNC: 3.2 g/dL (ref 2.9–4.4)
ALPHA 1: 0.3 g/dL (ref 0.0–0.4)
Albumin/Glob SerPl: 1.1 (ref 0.7–1.7)
Alpha2 Glob SerPl Elph-Mcnc: 1 g/dL (ref 0.4–1.0)
B-Globulin SerPl Elph-Mcnc: 0.9 g/dL (ref 0.7–1.3)
GAMMA GLOB SERPL ELPH-MCNC: 1 g/dL (ref 0.4–1.8)
GLOBULIN, TOTAL: 3.2 g/dL (ref 2.2–3.9)
IGA/IMMUNOGLOBULIN A, SERUM: 35 mg/dL — AB (ref 61–437)
IGM (IMMUNOGLOBIN M), SRM: 7 mg/dL — AB (ref 15–143)
IgG, Qn, Serum: 1023 mg/dL (ref 700–1600)
M Protein SerPl Elph-Mcnc: 0.7 g/dL — ABNORMAL HIGH
Total Protein: 6.4 g/dL (ref 6.0–8.5)

## 2016-12-17 ENCOUNTER — Telehealth: Payer: Self-pay

## 2016-12-17 NOTE — Telephone Encounter (Signed)
Spoke with patient and he is aware of his added appts on 12/20 per 12/7 inbasket  Marlin Jarrard

## 2016-12-23 DIAGNOSIS — K2211 Ulcer of esophagus with bleeding: Secondary | ICD-10-CM | POA: Diagnosis not present

## 2016-12-23 DIAGNOSIS — K209 Esophagitis, unspecified: Secondary | ICD-10-CM | POA: Diagnosis not present

## 2016-12-26 ENCOUNTER — Ambulatory Visit (HOSPITAL_BASED_OUTPATIENT_CLINIC_OR_DEPARTMENT_OTHER): Payer: Medicare Other

## 2016-12-26 ENCOUNTER — Ambulatory Visit: Payer: Medicare Other

## 2016-12-26 ENCOUNTER — Ambulatory Visit (HOSPITAL_BASED_OUTPATIENT_CLINIC_OR_DEPARTMENT_OTHER): Payer: Medicare Other | Admitting: Hematology and Oncology

## 2016-12-26 ENCOUNTER — Other Ambulatory Visit: Payer: Self-pay | Admitting: Hematology and Oncology

## 2016-12-26 ENCOUNTER — Encounter: Payer: Self-pay | Admitting: Hematology and Oncology

## 2016-12-26 ENCOUNTER — Other Ambulatory Visit (HOSPITAL_BASED_OUTPATIENT_CLINIC_OR_DEPARTMENT_OTHER): Payer: Medicare Other

## 2016-12-26 DIAGNOSIS — D62 Acute posthemorrhagic anemia: Secondary | ICD-10-CM

## 2016-12-26 DIAGNOSIS — N183 Chronic kidney disease, stage 3 unspecified: Secondary | ICD-10-CM

## 2016-12-26 DIAGNOSIS — D539 Nutritional anemia, unspecified: Secondary | ICD-10-CM

## 2016-12-26 DIAGNOSIS — C9002 Multiple myeloma in relapse: Secondary | ICD-10-CM | POA: Diagnosis not present

## 2016-12-26 DIAGNOSIS — Z95828 Presence of other vascular implants and grafts: Secondary | ICD-10-CM

## 2016-12-26 DIAGNOSIS — N2889 Other specified disorders of kidney and ureter: Secondary | ICD-10-CM

## 2016-12-26 DIAGNOSIS — D631 Anemia in chronic kidney disease: Secondary | ICD-10-CM | POA: Diagnosis not present

## 2016-12-26 DIAGNOSIS — K209 Esophagitis, unspecified without bleeding: Secondary | ICD-10-CM

## 2016-12-26 LAB — CBC WITH DIFFERENTIAL/PLATELET
BASO%: 4.9 % — ABNORMAL HIGH (ref 0.0–2.0)
BASOS ABS: 0.2 10*3/uL — AB (ref 0.0–0.1)
EOS ABS: 0.4 10*3/uL (ref 0.0–0.5)
EOS%: 8.9 % — AB (ref 0.0–7.0)
HEMATOCRIT: 25.4 % — AB (ref 38.4–49.9)
HGB: 8.6 g/dL — ABNORMAL LOW (ref 13.0–17.1)
LYMPH#: 2 10*3/uL (ref 0.9–3.3)
LYMPH%: 49.4 % — ABNORMAL HIGH (ref 14.0–49.0)
MCH: 31.3 pg (ref 27.2–33.4)
MCHC: 33.7 g/dL (ref 32.0–36.0)
MCV: 93.1 fL (ref 79.3–98.0)
MONO#: 0.4 10*3/uL (ref 0.1–0.9)
MONO%: 8.7 % (ref 0.0–14.0)
NEUT#: 1.1 10*3/uL — ABNORMAL LOW (ref 1.5–6.5)
NEUT%: 28.1 % — AB (ref 39.0–75.0)
Platelets: 276 10*3/uL (ref 140–400)
RBC: 2.73 10*6/uL — ABNORMAL LOW (ref 4.20–5.82)
RDW: 19.9 % — ABNORMAL HIGH (ref 11.0–14.6)
WBC: 4.1 10*3/uL (ref 4.0–10.3)

## 2016-12-26 LAB — COMPREHENSIVE METABOLIC PANEL
ALBUMIN: 3 g/dL — AB (ref 3.5–5.0)
ALK PHOS: 61 U/L (ref 40–150)
ALT: 7 U/L (ref 0–55)
AST: 11 U/L (ref 5–34)
Anion Gap: 9 mEq/L (ref 3–11)
BUN: 11.3 mg/dL (ref 7.0–26.0)
CALCIUM: 8.1 mg/dL — AB (ref 8.4–10.4)
CHLORIDE: 110 meq/L — AB (ref 98–109)
CO2: 21 mEq/L — ABNORMAL LOW (ref 22–29)
Creatinine: 1.5 mg/dL — ABNORMAL HIGH (ref 0.7–1.3)
EGFR: 51 mL/min/{1.73_m2} — AB (ref >60)
Glucose: 79 mg/dl (ref 70–140)
POTASSIUM: 3.7 meq/L (ref 3.5–5.1)
Sodium: 140 mEq/L (ref 136–145)
Total Bilirubin: 0.66 mg/dL (ref 0.20–1.20)
Total Protein: 5.7 g/dL — ABNORMAL LOW (ref 6.4–8.3)

## 2016-12-26 MED ORDER — HEPARIN SOD (PORK) LOCK FLUSH 100 UNIT/ML IV SOLN
500.0000 [IU] | Freq: Once | INTRAVENOUS | Status: AC
  Administered 2016-12-26: 500 [IU]
  Filled 2016-12-26: qty 5

## 2016-12-26 MED ORDER — SODIUM CHLORIDE 0.9% FLUSH
10.0000 mL | Freq: Once | INTRAVENOUS | Status: AC
  Administered 2016-12-26: 10 mL
  Filled 2016-12-26: qty 10

## 2016-12-26 MED ORDER — SODIUM CHLORIDE 0.9 % IV SOLN
1000.0000 mL | Freq: Once | INTRAVENOUS | Status: AC
  Administered 2016-12-26: 1000 mL via INTRAVENOUS

## 2016-12-26 NOTE — Assessment & Plan Note (Signed)
This has improved since resumption of proton pump inhibitor and Carafate

## 2016-12-26 NOTE — Patient Instructions (Signed)
Dehydration, Adult Dehydration is when there is not enough fluid or water in your body. This happens when you lose more fluids than you take in. Dehydration can range from mild to very bad. It should be treated right away to keep it from getting very bad. Symptoms of mild dehydration may include:  Thirst.  Dry lips.  Slightly dry mouth.  Dry, warm skin.  Dizziness. Symptoms of moderate dehydration may include:  Very dry mouth.  Muscle cramps.  Dark pee (urine). Pee may be the color of tea.  Your body making less pee.  Your eyes making fewer tears.  Heartbeat that is uneven or faster than normal (palpitations).  Headache.  Light-headedness, especially when you stand up from sitting.  Fainting (syncope). Symptoms of very bad dehydration may include:  Changes in skin, such as: ? Cold and clammy skin. ? Blotchy (mottled) or pale skin. ? Skin that does not quickly return to normal after being lightly pinched and let go (poor skin turgor).  Changes in body fluids, such as: ? Feeling very thirsty. ? Your eyes making fewer tears. ? Not sweating when body temperature is high, such as in hot weather. ? Your body making very little pee.  Changes in vital signs, such as: ? Weak pulse. ? Pulse that is more than 100 beats a minute when you are sitting still. ? Fast breathing. ? Low blood pressure.  Other changes, such as: ? Sunken eyes. ? Cold hands and feet. ? Confusion. ? Lack of energy (lethargy). ? Trouble waking up from sleep. ? Short-term weight loss. ? Unconsciousness. Follow these instructions at home:  If told by your doctor, drink an ORS: ? Make an ORS by using instructions on the package. ? Start by drinking small amounts, about  cup (120 mL) every 5-10 minutes. ? Slowly drink more until you have had the amount that your doctor said to have.  Drink enough clear fluid to keep your pee clear or pale yellow. If you were told to drink an ORS, finish the ORS  first, then start slowly drinking clear fluids. Drink fluids such as: ? Water. Do not drink only water by itself. Doing that can make the salt (sodium) level in your body get too low (hyponatremia). ? Ice chips. ? Fruit juice that you have added water to (diluted). ? Low-calorie sports drinks.  Avoid: ? Alcohol. ? Drinks that have a lot of sugar. These include high-calorie sports drinks, fruit juice that does not have water added, and soda. ? Caffeine. ? Foods that are greasy or have a lot of fat or sugar.  Take over-the-counter and prescription medicines only as told by your doctor.  Do not take salt tablets. Doing that can make the salt level in your body get too high (hypernatremia).  Eat foods that have minerals (electrolytes). Examples include bananas, oranges, potatoes, tomatoes, and spinach.  Keep all follow-up visits as told by your doctor. This is important. Contact a doctor if:  You have belly (abdominal) pain that: ? Gets worse. ? Stays in one area (localizes).  You have a rash.  You have a stiff neck.  You get angry or annoyed more easily than normal (irritability).  You are more sleepy than normal.  You have a harder time waking up than normal.  You feel: ? Weak. ? Dizzy. ? Very thirsty.  You have peed (urinated) only a small amount of very dark pee during 6-8 hours. Get help right away if:  You have symptoms of   very bad dehydration.  You cannot drink fluids without throwing up (vomiting).  Your symptoms get worse with treatment.  You have a fever.  You have a very bad headache.  You are throwing up or having watery poop (diarrhea) and it: ? Gets worse. ? Does not go away.  You have blood or something green (bile) in your throw-up.  You have blood in your poop (stool). This may cause poop to look black and tarry.  You have not peed in 6-8 hours.  You pass out (faint).  Your heart rate when you are sitting still is more than 100 beats a  minute.  You have trouble breathing. This information is not intended to replace advice given to you by your health care provider. Make sure you discuss any questions you have with your health care provider. Document Released: 10/20/2008 Document Revised: 07/14/2015 Document Reviewed: 02/17/2015 Elsevier Interactive Patient Education  2018 [employer]  

## 2016-12-26 NOTE — Assessment & Plan Note (Signed)
Recent myeloma panel is stable We reviewed his recent blood work Given progression of disease, he was started on Pomalyst on 08/14/2016. Clinically, he appears to be responding very well to treatment  He will continue treatment with Pomalyst 3 weeks on 1 week off. He will continue Daratumumab as scheduled every 4 weeks He is reminded to take calcium with vitamin D He will get Zometa every 6 months

## 2016-12-26 NOTE — Assessment & Plan Note (Signed)
He has multifactorial anemia, combination of anemia chronic kidney disease and recent blood loss/iron deficiency anemia His hemoglobin is stable.  Continue close observation I plan to recheck iron studies in his next visit

## 2016-12-26 NOTE — Assessment & Plan Note (Signed)
He had recent acute on chronic renal failure secondary to poor oral intake, nausea and dehydration I recommend discontinuation of hydrochlorothiazide and potassium supplements I recommend increase oral fluid intake as tolerated I recommend he takes higher dose Imodium due to his ongoing diarrhea Since recent IV fluid resuscitation, he is doing well and has gained some weight I would give him 1 more dose of IV fluids today

## 2016-12-26 NOTE — Progress Notes (Signed)
[place] OFFICE PROGRESS NOTE  Patient Care Team: Lucianne Lei, MD as PCP - General (Family Medicine) Heath Lark, MD as Consulting Physician (Hematology and Oncology) Pleasant, [name], RN as [place] Management  SUMMARY OF ONCOLOGIC HISTORY:   Multiple myeloma in relapse [facility])   12/12/2010 Initial Diagnosis    Multiple myeloma      02/16/2014 Imaging    Skeletal survey showed diffuse osteopenia      03/02/2014 Bone Marrow Biopsy    Accession: [id number] BM biopsy showed only 5 % plasma cells. However, the biopsy was difficult and the bone was fragmented during the procedure. Cytogenetics and FISH study is normal      03/03/2014 Procedure    he has port placement      03/10/2014 - 05/19/2014 Chemotherapy    he received elotuzumab and revlimid      05/20/2014 - 02/25/2016 Chemotherapy    Treatment is switched to maintenance Revlimid only. Treatment is stopped due to progressive disease      11/15/2014 Adverse Reaction    He has mild worsening anemia. Does of Revlimid reduced to 5 mg 21 days on, 7 days off      03/06/2016 -  Chemotherapy    He received Daratumumab and Velcade. Velcade is stopped on 07/24/16      08/14/2016 Miscellaneous    Pomalyst is added along with Daratumumab      10/17/2016 Surgery    EGD was performed for coffee-ground emesis. Multiple linear superficial ulcers were noted in the esophagus from 25-35 cm from insertion. Severe esophagitis was noted from 30-35 cm from insertion. Multiple biopsies were obtained. A small hiatal hernia was noted. The gastric cavity contained coffee-ground fluid, after suctioning and lavage, no obvious gastric lesions/erosion/ulceration/erythema was noted. Biopsies were taken from the antrum to rule out H. Pylori. Duodenum bulb appeared unremarkable. A small diverticulum was noted in second portion of the duodenum.        12/06/2016 Imaging    Ct scan abdomen No acute  findings.  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  Stable small hiatal hernia.  Colonic diverticulosis, without radiographic evidence of diverticulitis.       INTERVAL HISTORY: Please see below for problem oriented charting. He returns for further follow-up He is doing well He is eating better Denies recent nausea or he has gained some weight He denies recent infection No recent falls No bone pain  REVIEW OF SYSTEMS:   Constitutional: Denies fevers, chills or abnormal weight loss Eyes: Denies blurriness of vision Ears, nose, mouth, throat, and face: Denies mucositis or sore throat Respiratory: Denies cough, dyspnea or wheezes Cardiovascular: Denies palpitation, chest discomfort or lower extremity swelling Gastrointestinal:  Denies nausea, heartburn or change in bowel habits Skin: Denies abnormal skin rashes Lymphatics: Denies new lymphadenopathy or easy bruising Neurological:Denies numbness, tingling or new weaknesses Behavioral/Psych: Mood is stable, no new changes  All other systems were reviewed with the patient and are negative.  I have reviewed the past medical history, past surgical history, social history and family history with the patient and they are unchanged from previous note.  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  MEDICATIONS:  Current Outpatient Medications  Medication Sig Dispense Refill  . acyclovir (ZOVIRAX) 400 MG tablet Take 1 tablet (400 mg total) by mouth daily. 60 tablet 11  . allopurinol (ZYLOPRIM) 100 MG tablet Take 100 mg by mouth 2 (two) times daily.     Marland Kitchen amLODipine (  NORVASC) 10 MG tablet Take 10 mg by mouth daily.  1  . aspirin EC 81 MG tablet Take 81 mg by mouth daily.    Marland Kitchen atorvastatin (LIPITOR) 20 MG tablet Take 20 mg by mouth at bedtime.     . brimonidine (ALPHAGAN) 0.15 % ophthalmic solution Place 1 drop into the right eye 3 (three) times daily.    . cholecalciferol (VITAMIN D) 1000  UNITS tablet Take 1,000 Units by mouth daily.    . dorzolamide-timolol (COSOPT) 22.3-6.8 MG/ML ophthalmic solution Place 1 drop into both eyes 2 (two) times daily.     . fluorometholone (FML) 0.1 % ophthalmic suspension Place 1 drop into both eyes daily.    Marland Kitchen latanoprost (XALATAN) 0.005 % ophthalmic solution Place 1 drop into the right eye at bedtime.    . lidocaine-prilocaine (EMLA) cream Apply to affected area once 30 g 3  . loperamide (IMODIUM A-D) 2 MG tablet 1 po after each watery BM.  Maximum 6 per 24hrs. 30 tablet PRN  . ondansetron (ZOFRAN ODT) 4 MG disintegrating tablet Take 1 tablet (4 mg total) by mouth every 8 (eight) hours as needed for nausea or vomiting. 20 tablet 1  . ondansetron (ZOFRAN) 8 MG tablet Take 1 tablet (8 mg total) by mouth 2 (two) times daily as needed (Nausea or vomiting). 30 tablet 1  . pantoprazole (PROTONIX) 40 MG tablet Take 1 tablet (40 mg total) by mouth 2 (two) times daily before a meal. 60 tablet 9  . pomalidomide (POMALYST) 2 MG capsule Take 1 capsule (2 mg total) by mouth daily. Take with water on days 1-21. Repeat every 28 days. 21 capsule 0  . prochlorperazine (COMPAZINE) 10 MG tablet Take 1 tablet (10 mg total) by mouth every 6 (six) hours as needed (Nausea or vomiting). (Patient not taking: Reported on 12/06/2016) 30 tablet 1  . sucralfate (CARAFATE) 1 GM/10ML suspension Take 10 mLs (1 g total) by mouth 2 (two) times daily. 420 mL 1   No current facility-administered medications for this visit.    Facility-Administered Medications Ordered in Other Visits  Medication Dose Route Frequency Provider Last Rate Last Dose  . 0.9 %  sodium chloride infusion   Intravenous Once Alvy Bimler, Karim Aiello, MD        PHYSICAL EXAMINATION: ECOG PERFORMANCE STATUS: 1 - Symptomatic but completely ambulatory  Vitals:   12/26/16 0943  BP: 136/63  Pulse: 71  Resp: 18  Temp: 98.6 F (37 C)  SpO2: 100%   Filed Weights   12/26/16 0943  Weight: 155 lb 6.4 oz (70.5 kg)     GENERAL:alert, no distress and comfortable SKIN: skin color, texture, turgor are normal, no rashes or significant lesions EYES: normal, Conjunctiva are pink and non-injected, sclera clear OROPHARYNX:no exudate, no erythema and lips, buccal mucosa, and tongue normal  NECK: supple, thyroid normal size, non-tender, without nodularity LYMPH:  no palpable lymphadenopathy in the cervical, axillary or inguinal LUNGS: clear to auscultation and percussion with normal breathing effort HEART: regular rate & rhythm and no murmurs and no lower extremity edema ABDOMEN:abdomen soft, non-tender and normal bowel sounds Musculoskeletal:no cyanosis of digits and no clubbing  NEURO: alert & oriented x 3 with fluent speech, no focal motor/sensory deficits  LABORATORY DATA:  I have reviewed the data as listed    Component Value Date/Time   NA 140 12/26/2016 0834   K 3.7 12/26/2016 0834   CL 99 (L) 12/06/2016 1824   CL 107 06/29/2012 1131   CO2 21 (L)  12/26/2016 0834   GLUCOSE 79 12/26/2016 0834   GLUCOSE 143 (H) 06/29/2012 1131   BUN 11.3 12/26/2016 0834   CREATININE 1.5 (H) 12/26/2016 0834   CALCIUM 8.1 (L) 12/26/2016 0834   PROT 5.7 (L) 12/26/2016 0834   ALBUMIN 3.0 (L) 12/26/2016 0834   AST 11 12/26/2016 0834   ALT 7 12/26/2016 0834   ALKPHOS 61 12/26/2016 0834   BILITOT 0.66 12/26/2016 0834   GFRNONAA 27 (L) 12/06/2016 1824   GFRAA 31 (L) 12/06/2016 1824    No results found for: SPEP, UPEP  Lab Results  Component Value Date   WBC 4.1 12/26/2016   NEUTROABS 1.1 (L) 12/26/2016   HGB 8.6 (L) 12/26/2016   HCT 25.4 (L) 12/26/2016   MCV 93.1 12/26/2016   PLT 276 12/26/2016      Chemistry      Component Value Date/Time   NA 140 12/26/2016 0834   K 3.7 12/26/2016 0834   CL 99 (L) 12/06/2016 1824   CL 107 06/29/2012 1131   CO2 21 (L) 12/26/2016 0834   BUN 11.3 12/26/2016 0834   CREATININE 1.5 (H) 12/26/2016 0834      Component Value Date/Time   CALCIUM 8.1 (L) 12/26/2016 0834    ALKPHOS 61 12/26/2016 0834   AST 11 12/26/2016 0834   ALT 7 12/26/2016 0834   BILITOT 0.66 12/26/2016 0834       RADIOGRAPHIC STUDIES: I have personally reviewed the radiological images as listed and agreed with the findings in the report. Ct Abdomen Pelvis Wo Contrast  Result Date: 12/06/2016 CLINICAL DATA:  Left-sided abdominal pain and vomiting for 3 days. Fever. Currently undergoing chemotherapy for multiple myeloma. EXAM: CT ABDOMEN AND PELVIS WITHOUT CONTRAST TECHNIQUE: Multidetector CT imaging of the abdomen and pelvis was performed following the standard protocol without IV contrast. COMPARISON:  04/08/2015 FINDINGS: Lower chest: No acute findings. Hepatobiliary: No mass visualized on this unenhanced exam. Gallbladder is unremarkable. Pancreas: No mass or inflammatory process visualized on this unenhanced exam. Spleen:  Within normal limits in size. Adrenals/Urinary tract: No evidence of urolithiasis or hydronephrosis. Stable fluid attenuation left renal cyst. Mild diffuse bladder wall thickening, likely due to chronic bladder outlet obstruction given enlarged prostate. Stomach/Bowel: Small hiatal hernia is unchanged. No evidence of obstruction, inflammatory process, or abnormal fluid collections. Normal appendix visualized. Sigmoid diverticulosis again noted, without evidence of diverticulitis. Vascular/Lymphatic: No pathologically enlarged lymph nodes identified. No evidence of abdominal aortic aneurysm. Reproductive: Mildly enlarged prostate which indents the bladder base. Other:  None. Musculoskeletal: No suspicious bone lesions identified. Severe lumbar spine degenerative changes and levoscoliosis. IMPRESSION: No acute findings. 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 Stable small hiatal hernia. Colonic diverticulosis, without radiographic evidence of diverticulitis. Electronically Signed   By: Earle Gell M.D.   On: 12/06/2016 21:56    ASSESSMENT &  PLAN:  Multiple myeloma in relapse [facility]) Recent myeloma panel is stable We reviewed his recent blood work Given progression of disease, he was started on Pomalyst on 08/14/2016. Clinically, he appears to be responding very well to treatment  He will continue treatment with Pomalyst 3 weeks on 1 week off. He will continue Daratumumab as scheduled every 4 weeks He is reminded to take calcium with vitamin D He will get Zometa every 6 months  Anemia in chronic renal disease He has multifactorial anemia, combination of anemia chronic kidney disease and recent blood loss/iron deficiency anemia His hemoglobin is stable.  Continue close observation I plan to recheck iron  studies in his next visit  Chronic renal insufficiency, stage III (moderate) He had recent acute on chronic renal failure secondary to poor oral intake, nausea and dehydration I recommend discontinuation of hydrochlorothiazide and potassium supplements I recommend increase oral fluid intake as tolerated I recommend he takes higher dose Imodium due to his ongoing diarrhea Since recent IV fluid resuscitation, he is doing well and has gained some weight I would give him 1 more dose of IV fluids today  Esophagitis, acute This has improved since resumption of proton pump inhibitor and Carafate   No orders of the defined types were placed in this encounter.  All questions were answered. The patient knows to call the clinic with any problems, questions or concerns. No barriers to learning was detected. I spent 15 minutes counseling the patient face to face. The total time spent in the appointment was 20 minutes and more than 50% was on counseling and review of test results     Heath Lark, MD 12/26/2016 10:58 AM

## 2016-12-27 ENCOUNTER — Telehealth: Payer: Self-pay | Admitting: Hematology and Oncology

## 2016-12-27 NOTE — Telephone Encounter (Signed)
Per 12/21 los -Return for No new orders or return visit.

## 2017-01-01 ENCOUNTER — Other Ambulatory Visit: Payer: Self-pay

## 2017-01-01 DIAGNOSIS — C9002 Multiple myeloma in relapse: Secondary | ICD-10-CM

## 2017-01-01 MED ORDER — POMALIDOMIDE 2 MG PO CAPS: 2.0000 mg | ORAL_CAPSULE | Freq: Every day | ORAL | 0 refills | Status: DC

## 2017-01-09 ENCOUNTER — Telehealth: Payer: Self-pay | Admitting: Hematology and Oncology

## 2017-01-09 ENCOUNTER — Inpatient Hospital Stay: Payer: Medicare Other | Attending: Hematology and Oncology | Admitting: Hematology and Oncology

## 2017-01-09 ENCOUNTER — Encounter: Payer: Self-pay | Admitting: Hematology and Oncology

## 2017-01-09 ENCOUNTER — Other Ambulatory Visit (HOSPITAL_BASED_OUTPATIENT_CLINIC_OR_DEPARTMENT_OTHER): Payer: Medicare Other

## 2017-01-09 ENCOUNTER — Ambulatory Visit: Payer: Medicare Other

## 2017-01-09 ENCOUNTER — Other Ambulatory Visit: Payer: Self-pay | Admitting: Hematology and Oncology

## 2017-01-09 ENCOUNTER — Inpatient Hospital Stay: Payer: Medicare Other | Attending: Hematology and Oncology

## 2017-01-09 VITALS — BP 117/76 | HR 65 | Temp 97.8°F | Resp 17

## 2017-01-09 DIAGNOSIS — D500 Iron deficiency anemia secondary to blood loss (chronic): Secondary | ICD-10-CM | POA: Diagnosis not present

## 2017-01-09 DIAGNOSIS — N183 Chronic kidney disease, stage 3 unspecified: Secondary | ICD-10-CM

## 2017-01-09 DIAGNOSIS — Z5112 Encounter for antineoplastic immunotherapy: Secondary | ICD-10-CM | POA: Diagnosis not present

## 2017-01-09 DIAGNOSIS — Z79899 Other long term (current) drug therapy: Secondary | ICD-10-CM | POA: Insufficient documentation

## 2017-01-09 DIAGNOSIS — E860 Dehydration: Secondary | ICD-10-CM | POA: Insufficient documentation

## 2017-01-09 DIAGNOSIS — C9002 Multiple myeloma in relapse: Secondary | ICD-10-CM

## 2017-01-09 DIAGNOSIS — E441 Mild protein-calorie malnutrition: Secondary | ICD-10-CM | POA: Diagnosis not present

## 2017-01-09 DIAGNOSIS — D61818 Other pancytopenia: Secondary | ICD-10-CM

## 2017-01-09 DIAGNOSIS — R197 Diarrhea, unspecified: Secondary | ICD-10-CM | POA: Diagnosis not present

## 2017-01-09 DIAGNOSIS — D539 Nutritional anemia, unspecified: Secondary | ICD-10-CM | POA: Diagnosis not present

## 2017-01-09 DIAGNOSIS — D509 Iron deficiency anemia, unspecified: Secondary | ICD-10-CM | POA: Insufficient documentation

## 2017-01-09 DIAGNOSIS — Z95828 Presence of other vascular implants and grafts: Secondary | ICD-10-CM

## 2017-01-09 DIAGNOSIS — L853 Xerosis cutis: Secondary | ICD-10-CM | POA: Insufficient documentation

## 2017-01-09 DIAGNOSIS — Z7982 Long term (current) use of aspirin: Secondary | ICD-10-CM | POA: Insufficient documentation

## 2017-01-09 LAB — CBC WITH DIFFERENTIAL/PLATELET
BASO%: 3.4 % — ABNORMAL HIGH (ref 0.0–2.0)
BASOS ABS: 0.1 10*3/uL (ref 0.0–0.1)
EOS ABS: 0.3 10*3/uL (ref 0.0–0.5)
EOS%: 6.2 % (ref 0.0–7.0)
HEMATOCRIT: 25 % — AB (ref 38.4–49.9)
HEMOGLOBIN: 8.4 g/dL — AB (ref 13.0–17.1)
LYMPH#: 2.2 10*3/uL (ref 0.9–3.3)
LYMPH%: 49.6 % — ABNORMAL HIGH (ref 14.0–49.0)
MCH: 31.5 pg (ref 27.2–33.4)
MCHC: 33.5 g/dL (ref 32.0–36.0)
MCV: 94.1 fL (ref 79.3–98.0)
MONO#: 0.5 10*3/uL (ref 0.1–0.9)
MONO%: 11.9 % (ref 0.0–14.0)
NEUT#: 1.3 10*3/uL — ABNORMAL LOW (ref 1.5–6.5)
NEUT%: 28.9 % — ABNORMAL LOW (ref 39.0–75.0)
PLATELETS: 406 10*3/uL — AB (ref 140–400)
RBC: 2.65 10*6/uL — ABNORMAL LOW (ref 4.20–5.82)
RDW: 20.4 % — AB (ref 11.0–14.6)
WBC: 4.4 10*3/uL (ref 4.0–10.3)

## 2017-01-09 LAB — COMPREHENSIVE METABOLIC PANEL
ALT: 10 U/L (ref 0–55)
AST: 12 U/L (ref 5–34)
Albumin: 2.9 g/dL — ABNORMAL LOW (ref 3.5–5.0)
Alkaline Phosphatase: 55 U/L (ref 40–150)
Anion Gap: 9 mEq/L (ref 3–11)
BUN: 9.8 mg/dL (ref 7.0–26.0)
CHLORIDE: 110 meq/L — AB (ref 98–109)
CO2: 21 meq/L — AB (ref 22–29)
CREATININE: 1.5 mg/dL — AB (ref 0.7–1.3)
Calcium: 7.5 mg/dL — ABNORMAL LOW (ref 8.4–10.4)
EGFR: 52 mL/min/{1.73_m2} — ABNORMAL LOW (ref >60)
Glucose: 80 mg/dl (ref 70–140)
POTASSIUM: 3.7 meq/L (ref 3.5–5.1)
Sodium: 140 mEq/L (ref 136–145)
Total Bilirubin: 0.44 mg/dL (ref 0.20–1.20)
Total Protein: 5.7 g/dL — ABNORMAL LOW (ref 6.4–8.3)

## 2017-01-09 LAB — IRON AND TIBC
%SAT: 18 % — AB (ref 20–55)
Iron: 34 ug/dL — ABNORMAL LOW (ref 42–163)
TIBC: 191 ug/dL — AB (ref 202–409)
UIBC: 157 ug/dL (ref 117–376)

## 2017-01-09 LAB — FERRITIN: FERRITIN: 147 ng/mL (ref 22–316)

## 2017-01-09 MED ORDER — ACETAMINOPHEN 325 MG PO TABS
650.0000 mg | ORAL_TABLET | Freq: Once | ORAL | Status: AC
  Administered 2017-01-09: 650 mg via ORAL

## 2017-01-09 MED ORDER — SODIUM CHLORIDE 0.9% FLUSH
3.0000 mL | Freq: Once | INTRAVENOUS | Status: DC | PRN
  Filled 2017-01-09: qty 10

## 2017-01-09 MED ORDER — METHYLPREDNISOLONE SODIUM SUCC 40 MG IJ SOLR
40.0000 mg | Freq: Once | INTRAMUSCULAR | Status: AC
  Administered 2017-01-09: 40 mg via INTRAVENOUS

## 2017-01-09 MED ORDER — METHYLPREDNISOLONE SODIUM SUCC 40 MG IJ SOLR
INTRAMUSCULAR | Status: AC
Start: 2017-01-09 — End: 2017-01-09
  Filled 2017-01-09: qty 1

## 2017-01-09 MED ORDER — HEPARIN SOD (PORK) LOCK FLUSH 100 UNIT/ML IV SOLN
500.0000 [IU] | Freq: Once | INTRAVENOUS | Status: AC | PRN
  Administered 2017-01-09: 500 [IU]
  Filled 2017-01-09: qty 5

## 2017-01-09 MED ORDER — DIPHENHYDRAMINE HCL 25 MG PO CAPS
50.0000 mg | ORAL_CAPSULE | Freq: Once | ORAL | Status: AC
  Administered 2017-01-09: 50 mg via ORAL

## 2017-01-09 MED ORDER — SODIUM CHLORIDE 0.9 % IV SOLN
Freq: Once | INTRAVENOUS | Status: AC
  Administered 2017-01-09: 11:00:00 via INTRAVENOUS

## 2017-01-09 MED ORDER — ALTEPLASE 2 MG IJ SOLR
2.0000 mg | Freq: Once | INTRAMUSCULAR | Status: DC | PRN
  Filled 2017-01-09: qty 2

## 2017-01-09 MED ORDER — ACETAMINOPHEN 325 MG PO TABS
ORAL_TABLET | ORAL | Status: AC
  Filled 2017-01-09: qty 2

## 2017-01-09 MED ORDER — HEPARIN SOD (PORK) LOCK FLUSH 100 UNIT/ML IV SOLN
500.0000 [IU] | Freq: Once | INTRAVENOUS | Status: DC | PRN
  Filled 2017-01-09: qty 5

## 2017-01-09 MED ORDER — SODIUM CHLORIDE 0.9 % IV SOLN
1100.0000 mg | Freq: Once | INTRAVENOUS | Status: AC
  Administered 2017-01-09: 1100 mg via INTRAVENOUS
  Filled 2017-01-09: qty 40

## 2017-01-09 MED ORDER — SODIUM CHLORIDE 0.9% FLUSH
10.0000 mL | INTRAVENOUS | Status: DC | PRN
  Administered 2017-01-09: 10 mL
  Filled 2017-01-09: qty 10

## 2017-01-09 MED ORDER — HEPARIN SOD (PORK) LOCK FLUSH 100 UNIT/ML IV SOLN
250.0000 [IU] | Freq: Once | INTRAVENOUS | Status: DC | PRN
  Filled 2017-01-09: qty 5

## 2017-01-09 MED ORDER — DIPHENHYDRAMINE HCL 25 MG PO CAPS
ORAL_CAPSULE | ORAL | Status: AC
  Filled 2017-01-09: qty 2

## 2017-01-09 MED ORDER — PROCHLORPERAZINE MALEATE 10 MG PO TABS
10.0000 mg | ORAL_TABLET | Freq: Once | ORAL | Status: AC
  Administered 2017-01-09: 10 mg via ORAL

## 2017-01-09 MED ORDER — SODIUM CHLORIDE 0.9 % IV SOLN
510.0000 mg | Freq: Once | INTRAVENOUS | Status: AC
  Administered 2017-01-09: 510 mg via INTRAVENOUS
  Filled 2017-01-09: qty 17

## 2017-01-09 MED ORDER — PROCHLORPERAZINE MALEATE 10 MG PO TABS
ORAL_TABLET | ORAL | Status: AC
  Filled 2017-01-09: qty 1

## 2017-01-09 MED ORDER — SODIUM CHLORIDE 0.9% FLUSH
10.0000 mL | Freq: Once | INTRAVENOUS | Status: AC
  Administered 2017-01-09: 10 mL
  Filled 2017-01-09: qty 10

## 2017-01-09 NOTE — Telephone Encounter (Signed)
Gave patient AVs and calendar of upcoming January appointments.  °

## 2017-01-09 NOTE — Progress Notes (Signed)
[facility] OFFICE PROGRESS NOTE  Patient Care Team: Lucianne Lei, MD as PCP - General (Family Medicine) Heath Lark, MD as Consulting Physician (Hematology and Oncology) Pleasant, [name], RN as [place] Management  SUMMARY OF ONCOLOGIC HISTORY:   Multiple myeloma in relapse [facility])   12/12/2010 Initial Diagnosis    Multiple myeloma      02/16/2014 Imaging    Skeletal survey showed diffuse osteopenia      03/02/2014 Bone Marrow Biopsy    Accession: [id number] BM biopsy showed only 5 % plasma cells. However, the biopsy was difficult and the bone was fragmented during the procedure. Cytogenetics and FISH study is normal      03/03/2014 Procedure    he has port placement      03/10/2014 - 05/19/2014 Chemotherapy    he received elotuzumab and revlimid      05/20/2014 - 02/25/2016 Chemotherapy    Treatment is switched to maintenance Revlimid only. Treatment is stopped due to progressive disease      11/15/2014 Adverse Reaction    He has mild worsening anemia. Does of Revlimid reduced to 5 mg 21 days on, 7 days off      03/06/2016 -  Chemotherapy    He received Daratumumab and Velcade. Velcade is stopped on 07/24/16      08/14/2016 Miscellaneous    Pomalyst is added along with Daratumumab      10/17/2016 Surgery    EGD was performed for coffee-ground emesis. Multiple linear superficial ulcers were noted in the esophagus from 25-35 cm from insertion. Severe esophagitis was noted from 30-35 cm from insertion. Multiple biopsies were obtained. A small hiatal hernia was noted. The gastric cavity contained coffee-ground fluid, after suctioning and lavage, no obvious gastric lesions/erosion/ulceration/erythema was noted. Biopsies were taken from the antrum to rule out H. Pylori. Duodenum bulb appeared unremarkable. A small diverticulum was noted in second portion of the duodenum.        12/06/2016 Imaging    Ct scan abdomen No acute  findings.  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  Stable small hiatal hernia.  Colonic diverticulosis, without radiographic evidence of diverticulitis.       INTERVAL HISTORY: Please see below for problem oriented charting. He returns for further follow-up He is doing well and gaining denies recent infection No new bone pain No recent nausea The patient denies any recent signs or symptoms of bleeding such as spontaneous epistaxis, hematuria or hematochezia. He is compliant taking proton pump inhibitor as suggested  REVIEW OF SYSTEMS:   Constitutional: Denies fevers, chills or abnormal weight loss Eyes: Denies blurriness of vision Ears, nose, mouth, throat, and face: Denies mucositis or sore throat Respiratory: Denies cough, dyspnea or wheezes Cardiovascular: Denies palpitation, chest discomfort or lower extremity swelling Gastrointestinal:  Denies nausea, heartburn or change in bowel habits Skin: Denies abnormal skin rashes Lymphatics: Denies new lymphadenopathy or easy bruising Neurological:Denies numbness, tingling or new weaknesses Behavioral/Psych: Mood is stable, no new changes  All other systems were reviewed with the patient and are negative.  I have reviewed the past medical history, past surgical history, social history and family history with the patient and they are unchanged from previous note.  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  MEDICATIONS:  Current Outpatient Medications  Medication Sig Dispense Refill  . acyclovir (ZOVIRAX) 400 MG tablet Take 1 tablet (400 mg total) by mouth daily. 60 tablet 11  . allopurinol (ZYLOPRIM) 100 MG tablet  Take 100 mg by mouth 2 (two) times daily.     Marland Kitchen amLODipine (NORVASC) 10 MG tablet Take 10 mg by mouth daily.  1  . aspirin EC 81 MG tablet Take 81 mg by mouth daily.    Marland Kitchen atorvastatin (LIPITOR) 20 MG tablet Take 20 mg by mouth at bedtime.     . brimonidine (ALPHAGAN)  0.15 % ophthalmic solution Place 1 drop into the right eye 3 (three) times daily.    . cholecalciferol (VITAMIN D) 1000 UNITS tablet Take 1,000 Units by mouth daily.    . dorzolamide-timolol (COSOPT) 22.3-6.8 MG/ML ophthalmic solution Place 1 drop into both eyes 2 (two) times daily.     . fluorometholone (FML) 0.1 % ophthalmic suspension Place 1 drop into both eyes daily.    Marland Kitchen latanoprost (XALATAN) 0.005 % ophthalmic solution Place 1 drop into the right eye at bedtime.    . lidocaine-prilocaine (EMLA) cream Apply to affected area once 30 g 3  . loperamide (IMODIUM A-D) 2 MG tablet 1 po after each watery BM.  Maximum 6 per 24hrs. 30 tablet PRN  . ondansetron (ZOFRAN ODT) 4 MG disintegrating tablet Take 1 tablet (4 mg total) by mouth every 8 (eight) hours as needed for nausea or vomiting. 20 tablet 1  . ondansetron (ZOFRAN) 8 MG tablet Take 1 tablet (8 mg total) by mouth 2 (two) times daily as needed (Nausea or vomiting). 30 tablet 1  . pantoprazole (PROTONIX) 40 MG tablet Take 1 tablet (40 mg total) by mouth 2 (two) times daily before a meal. 60 tablet 9  . pomalidomide (POMALYST) 2 MG capsule Take 1 capsule (2 mg total) by mouth daily. Take with water on days 1-21. Repeat every 28 days. 21 capsule 0  . prochlorperazine (COMPAZINE) 10 MG tablet Take 1 tablet (10 mg total) by mouth every 6 (six) hours as needed (Nausea or vomiting). (Patient not taking: Reported on 12/06/2016) 30 tablet 1  . sucralfate (CARAFATE) 1 GM/10ML suspension Take 10 mLs (1 g total) by mouth 2 (two) times daily. 420 mL 1   No current facility-administered medications for this visit.    Facility-Administered Medications Ordered in Other Visits  Medication Dose Route Frequency Provider Last Rate Last Dose  . heparin lock flush 100 unit/mL  500 Units Intracatheter Once PRN Alvy Bimler, Dewanda Fennema, MD      . sodium chloride flush (NS) 0.9 % injection 10 mL  10 mL Intracatheter PRN Alvy Bimler, Dru Primeau, MD        PHYSICAL EXAMINATION: ECOG  PERFORMANCE STATUS: 1 - Symptomatic but completely ambulatory  Vitals:   01/09/17 1001  BP: 125/60  Pulse: 75  Resp: 18  Temp: 98.4 F (36.9 C)  SpO2: 100%   Filed Weights   01/09/17 1001  Weight: 156 lb 9.6 oz (71 kg)    GENERAL:alert, no distress and comfortable SKIN: skin color, texture, turgor are normal, no rashes or significant lesions EYES: normal, Conjunctiva are pink and non-injected, sclera clear OROPHARYNX:no exudate, no erythema and lips, buccal mucosa, and tongue normal  NECK: supple, thyroid normal size, non-tender, without nodularity LYMPH:  no palpable lymphadenopathy in the cervical, axillary or inguinal LUNGS: clear to auscultation and percussion with normal breathing effort HEART: regular rate & rhythm and no murmurs and no lower extremity edema ABDOMEN:abdomen soft, non-tender and normal bowel sounds Musculoskeletal:no cyanosis of digits and no clubbing  NEURO: alert & oriented x 3 with fluent speech, no focal motor/sensory deficits  LABORATORY DATA:  I have reviewed the  data as listed    Component Value Date/Time   NA 140 01/09/2017 0913   K 3.7 01/09/2017 0913   CL 99 (L) 12/06/2016 1824   CL 107 06/29/2012 1131   CO2 21 (L) 01/09/2017 0913   GLUCOSE 80 01/09/2017 0913   GLUCOSE 143 (H) 06/29/2012 1131   BUN 9.8 01/09/2017 0913   CREATININE 1.5 (H) 01/09/2017 0913   CALCIUM 7.5 (L) 01/09/2017 0913   PROT 5.7 (L) 01/09/2017 0913   ALBUMIN 2.9 (L) 01/09/2017 0913   AST 12 01/09/2017 0913   ALT 10 01/09/2017 0913   ALKPHOS 55 01/09/2017 0913   BILITOT 0.44 01/09/2017 0913   GFRNONAA 27 (L) 12/06/2016 1824   GFRAA 31 (L) 12/06/2016 1824    No results found for: SPEP, UPEP  Lab Results  Component Value Date   WBC 4.4 01/09/2017   NEUTROABS 1.3 (L) 01/09/2017   HGB 8.4 (L) 01/09/2017   HCT 25.0 (L) 01/09/2017   MCV 94.1 01/09/2017   PLT 406 (H) 01/09/2017      Chemistry      Component Value Date/Time   NA 140 01/09/2017 0913   K 3.7  01/09/2017 0913   CL 99 (L) 12/06/2016 1824   CL 107 06/29/2012 1131   CO2 21 (L) 01/09/2017 0913   BUN 9.8 01/09/2017 0913   CREATININE 1.5 (H) 01/09/2017 0913      Component Value Date/Time   CALCIUM 7.5 (L) 01/09/2017 0913   ALKPHOS 55 01/09/2017 0913   AST 12 01/09/2017 0913   ALT 10 01/09/2017 0913   BILITOT 0.44 01/09/2017 0913     ASSESSMENT & PLAN:  Multiple myeloma in relapse (HCC) Recent myeloma panel is stable Clinically, he appears to be responding very well to treatment except for mild pancytopenia of which he is not symptomatic I would proceed with treatment regardless of CBC He will continue treatment with Pomalyst 3 weeks on 1 week off. He will continue Daratumumab as scheduled every 4 weeks He is reminded to take calcium with vitamin D He will get Zometa every 6 months  Pancytopenia, acquired ([place]) He is not symptomatic from pancytopenia I will continue to treatment without dose adjustment He does not need blood transfusion He has history of iron deficiency anemia due to GI bleed Repeat iron studies show signs of iron deficiency even though ferritin level is within normal limits The most likely cause of his anemia is due to chronic blood loss/malabsorption syndrome. We discussed some of the risks, benefits, and alternatives of intravenous iron infusions. The patient is symptomatic from anemia and the iron level is critically low. He tolerated oral iron supplement poorly and desires to achieved higher levels of iron faster for adequate hematopoesis. Some of the side-effects to be expected including risks of infusion reactions, phlebitis, headaches, nausea and fatigue.  The patient is willing to proceed. Recommend iron infusion after daratumumab  Chronic renal insufficiency, stage III (moderate) He had recent acute on chronic renal failure secondary to poor oral intake, nausea and dehydration I recommend discontinuation of hydrochlorothiazide and potassium  supplements I recommend increase oral fluid intake as tolerated I recommend he takes higher dose Imodium due to his ongoing diarrhea Since recent IV fluid resuscitation, he is doing well and has gained some weight His serum creatinine has been stable since then  Protein calorie malnutrition ([place]) He has started to gain weight since the last time I saw him Recommend increase nutritional supplement as tolerated   No orders of the  defined types were placed in this encounter.  All questions were answered. The patient knows to call the clinic with any problems, questions or concerns. No barriers to learning was detected. I spent 20 minutes counseling the patient face to face. The total time spent in the appointment was 25 minutes and more than 50% was on counseling and review of test results     Heath Lark, MD 01/09/2017 1:20 PM

## 2017-01-09 NOTE — Assessment & Plan Note (Addendum)
He is not symptomatic from pancytopenia I will continue to treatment without dose adjustment He does not need blood transfusion He has history of iron deficiency anemia due to GI bleed Repeat iron studies show signs of iron deficiency even though ferritin level is within normal limits The most likely cause of his anemia is due to chronic blood loss/malabsorption syndrome. We discussed some of the risks, benefits, and alternatives of intravenous iron infusions. The patient is symptomatic from anemia and the iron level is critically low. He tolerated oral iron supplement poorly and desires to achieved higher levels of iron faster for adequate hematopoesis. Some of the side-effects to be expected including risks of infusion reactions, phlebitis, headaches, nausea and fatigue.  The patient is willing to proceed. Recommend iron infusion after daratumumab

## 2017-01-09 NOTE — Patient Instructions (Signed)
[facility] Discharge Instructions for Patients Receiving Chemotherapy  Today you received the following chemotherapy agents: Darzalex  To help prevent nausea and vomiting after your treatment, we encourage you to take your nausea medication as directed.    If you develop nausea and vomiting that is not controlled by your nausea medication, call the clinic.   BELOW ARE SYMPTOMS THAT SHOULD BE REPORTED IMMEDIATELY:  *FEVER GREATER THAN 100.5 F  *CHILLS WITH OR WITHOUT FEVER  NAUSEA AND VOMITING THAT IS NOT CONTROLLED WITH YOUR NAUSEA MEDICATION  *UNUSUAL SHORTNESS OF BREATH  *UNUSUAL BRUISING OR BLEEDING  TENDERNESS IN MOUTH AND THROAT WITH OR WITHOUT PRESENCE OF ULCERS  *URINARY PROBLEMS  *BOWEL PROBLEMS  UNUSUAL RASH Items with * indicate a potential emergency and should be followed up as soon as possible.  Feel free to call the clinic should you have any questions or concerns. The clinic phone number is [phone].  Please show the CHEMO ALERT CARD at check-in to the Emergency Department and triage nurse.   

## 2017-01-09 NOTE — Assessment & Plan Note (Signed)
Recent myeloma panel is stable Clinically, he appears to be responding very well to treatment except for mild pancytopenia of which he is not symptomatic I would proceed with treatment regardless of CBC He will continue treatment with Pomalyst 3 weeks on 1 week off. He will continue Daratumumab as scheduled every 4 weeks He is reminded to take calcium with vitamin D He will get Zometa every 6 months

## 2017-01-09 NOTE — Assessment & Plan Note (Addendum)
He had recent acute on chronic renal failure secondary to poor oral intake, nausea and dehydration I recommend discontinuation of hydrochlorothiazide and potassium supplements I recommend increase oral fluid intake as tolerated I recommend he takes higher dose Imodium due to his ongoing diarrhea Since recent IV fluid resuscitation, he is doing well and has gained some weight His serum creatinine has been stable since then

## 2017-01-09 NOTE — Assessment & Plan Note (Signed)
He has started to gain weight since the last time I saw him Recommend increase nutritional supplement as tolerated

## 2017-01-10 LAB — SEDIMENTATION RATE: SED RATE: 10 mm/h (ref 0–30)

## 2017-01-16 ENCOUNTER — Encounter: Payer: Self-pay | Admitting: Hematology and Oncology

## 2017-01-16 NOTE — Progress Notes (Signed)
Pt is approved w/ LLS to receive assistance up to $7,500 to use for ins premiums and/or qualifying treatment expenses effective 1/1/19to 01/06/18.

## 2017-01-19 ENCOUNTER — Other Ambulatory Visit: Payer: Self-pay | Admitting: Hematology and Oncology

## 2017-01-22 DIAGNOSIS — I10 Essential (primary) hypertension: Secondary | ICD-10-CM | POA: Diagnosis not present

## 2017-01-22 DIAGNOSIS — N189 Chronic kidney disease, unspecified: Secondary | ICD-10-CM | POA: Diagnosis not present

## 2017-01-22 DIAGNOSIS — R609 Edema, unspecified: Secondary | ICD-10-CM | POA: Diagnosis not present

## 2017-01-22 DIAGNOSIS — C9000 Multiple myeloma not having achieved remission: Secondary | ICD-10-CM | POA: Diagnosis not present

## 2017-01-27 ENCOUNTER — Other Ambulatory Visit: Payer: Self-pay | Admitting: *Deleted

## 2017-01-27 DIAGNOSIS — C9002 Multiple myeloma in relapse: Secondary | ICD-10-CM

## 2017-01-27 MED ORDER — POMALIDOMIDE 2 MG PO CAPS: 2.0000 mg | ORAL_CAPSULE | Freq: Every day | ORAL | 0 refills | Status: DC

## 2017-02-06 ENCOUNTER — Inpatient Hospital Stay (HOSPITAL_BASED_OUTPATIENT_CLINIC_OR_DEPARTMENT_OTHER): Payer: Medicare Other | Admitting: Hematology and Oncology

## 2017-02-06 ENCOUNTER — Inpatient Hospital Stay: Payer: Medicare Other

## 2017-02-06 ENCOUNTER — Telehealth: Payer: Self-pay | Admitting: Hematology and Oncology

## 2017-02-06 ENCOUNTER — Encounter: Payer: Self-pay | Admitting: Hematology and Oncology

## 2017-02-06 VITALS — BP 118/53 | HR 60 | Temp 97.8°F | Resp 16

## 2017-02-06 DIAGNOSIS — D509 Iron deficiency anemia, unspecified: Secondary | ICD-10-CM | POA: Insufficient documentation

## 2017-02-06 DIAGNOSIS — L853 Xerosis cutis: Secondary | ICD-10-CM | POA: Diagnosis not present

## 2017-02-06 DIAGNOSIS — Z79899 Other long term (current) drug therapy: Secondary | ICD-10-CM | POA: Diagnosis not present

## 2017-02-06 DIAGNOSIS — Z7982 Long term (current) use of aspirin: Secondary | ICD-10-CM | POA: Diagnosis not present

## 2017-02-06 DIAGNOSIS — C9002 Multiple myeloma in relapse: Secondary | ICD-10-CM

## 2017-02-06 DIAGNOSIS — N183 Chronic kidney disease, stage 3 unspecified: Secondary | ICD-10-CM

## 2017-02-06 DIAGNOSIS — E860 Dehydration: Secondary | ICD-10-CM

## 2017-02-06 DIAGNOSIS — D500 Iron deficiency anemia secondary to blood loss (chronic): Secondary | ICD-10-CM

## 2017-02-06 DIAGNOSIS — Z95828 Presence of other vascular implants and grafts: Secondary | ICD-10-CM

## 2017-02-06 DIAGNOSIS — Z5112 Encounter for antineoplastic immunotherapy: Secondary | ICD-10-CM | POA: Diagnosis not present

## 2017-02-06 DIAGNOSIS — D539 Nutritional anemia, unspecified: Secondary | ICD-10-CM

## 2017-02-06 LAB — COMPREHENSIVE METABOLIC PANEL
ALBUMIN: 3.2 g/dL — AB (ref 3.5–5.0)
ALK PHOS: 56 U/L (ref 40–150)
ALT: 8 U/L (ref 0–55)
AST: 14 U/L (ref 5–34)
Anion gap: 11 (ref 3–11)
BILIRUBIN TOTAL: 0.5 mg/dL (ref 0.2–1.2)
BUN: 16 mg/dL (ref 7–26)
CALCIUM: 8.2 mg/dL — AB (ref 8.4–10.4)
CO2: 20 mmol/L — ABNORMAL LOW (ref 22–29)
CREATININE: 1.81 mg/dL — AB (ref 0.70–1.30)
Chloride: 109 mmol/L (ref 98–109)
GFR calc Af Amer: 41 mL/min — ABNORMAL LOW (ref >60)
GFR calc non Af Amer: 35 mL/min — ABNORMAL LOW (ref >60)
GLUCOSE: 82 mg/dL (ref 70–140)
Potassium: 3.8 mmol/L (ref 3.5–5.1)
Sodium: 140 mmol/L (ref 136–145)
TOTAL PROTEIN: 6.6 g/dL (ref 6.4–8.3)

## 2017-02-06 LAB — CBC WITH DIFFERENTIAL/PLATELET
BASOS ABS: 0.2 10*3/uL — AB (ref 0.0–0.1)
Basophils Relative: 4 %
EOS PCT: 4 %
Eosinophils Absolute: 0.2 10*3/uL (ref 0.0–0.5)
HCT: 29.3 % — ABNORMAL LOW (ref 38.4–49.9)
Hemoglobin: 9.6 g/dL — ABNORMAL LOW (ref 13.0–17.1)
LYMPHS PCT: 41 %
Lymphs Abs: 2 10*3/uL (ref 0.9–3.3)
MCH: 32.3 pg (ref 27.2–33.4)
MCHC: 32.7 g/dL (ref 32.0–36.0)
MCV: 98.9 fL — AB (ref 79.3–98.0)
Monocytes Absolute: 0.9 10*3/uL (ref 0.1–0.9)
Monocytes Relative: 18 %
Neutro Abs: 1.6 10*3/uL (ref 1.5–6.5)
Neutrophils Relative %: 33 %
Platelets: 364 10*3/uL (ref 140–400)
RBC: 2.96 MIL/uL — ABNORMAL LOW (ref 4.20–5.82)
RDW: 20.7 % — ABNORMAL HIGH (ref 11.0–14.6)
WBC: 4.8 10*3/uL (ref 4.0–10.3)

## 2017-02-06 MED ORDER — ACETAMINOPHEN 325 MG PO TABS
650.0000 mg | ORAL_TABLET | Freq: Once | ORAL | Status: AC
  Administered 2017-02-06: 650 mg via ORAL

## 2017-02-06 MED ORDER — SODIUM CHLORIDE 0.9 % IV SOLN
1100.0000 mg | Freq: Once | INTRAVENOUS | Status: AC
  Administered 2017-02-06: 1100 mg via INTRAVENOUS
  Filled 2017-02-06: qty 40

## 2017-02-06 MED ORDER — SODIUM CHLORIDE 0.9% FLUSH
10.0000 mL | INTRAVENOUS | Status: DC | PRN
  Administered 2017-02-06: 10 mL
  Filled 2017-02-06: qty 10

## 2017-02-06 MED ORDER — PROCHLORPERAZINE MALEATE 10 MG PO TABS
ORAL_TABLET | ORAL | Status: AC
  Filled 2017-02-06: qty 1

## 2017-02-06 MED ORDER — SODIUM CHLORIDE 0.9 % IV SOLN
510.0000 mg | Freq: Once | INTRAVENOUS | Status: AC
  Administered 2017-02-06: 510 mg via INTRAVENOUS
  Filled 2017-02-06: qty 17

## 2017-02-06 MED ORDER — METHYLPREDNISOLONE SODIUM SUCC 40 MG IJ SOLR
40.0000 mg | Freq: Once | INTRAMUSCULAR | Status: AC
  Administered 2017-02-06: 40 mg via INTRAVENOUS

## 2017-02-06 MED ORDER — SODIUM CHLORIDE 0.9% FLUSH
10.0000 mL | INTRAVENOUS | Status: DC | PRN
  Filled 2017-02-06: qty 10

## 2017-02-06 MED ORDER — SODIUM CHLORIDE 0.9 % IV SOLN
Freq: Once | INTRAVENOUS | Status: AC
  Administered 2017-02-06: 10:00:00 via INTRAVENOUS

## 2017-02-06 MED ORDER — METHYLPREDNISOLONE SODIUM SUCC 40 MG IJ SOLR
INTRAMUSCULAR | Status: AC
  Filled 2017-02-06: qty 1

## 2017-02-06 MED ORDER — DIPHENHYDRAMINE HCL 25 MG PO CAPS
50.0000 mg | ORAL_CAPSULE | Freq: Once | ORAL | Status: AC
  Administered 2017-02-06: 50 mg via ORAL

## 2017-02-06 MED ORDER — PROCHLORPERAZINE MALEATE 10 MG PO TABS
10.0000 mg | ORAL_TABLET | Freq: Once | ORAL | Status: AC
  Administered 2017-02-06: 10 mg via ORAL

## 2017-02-06 MED ORDER — ACETAMINOPHEN 325 MG PO TABS
ORAL_TABLET | ORAL | Status: AC
  Filled 2017-02-06: qty 2

## 2017-02-06 MED ORDER — HEPARIN SOD (PORK) LOCK FLUSH 100 UNIT/ML IV SOLN
500.0000 [IU] | Freq: Once | INTRAVENOUS | Status: AC | PRN
  Administered 2017-02-06: 500 [IU]
  Filled 2017-02-06: qty 5

## 2017-02-06 MED ORDER — DIPHENHYDRAMINE HCL 25 MG PO CAPS
ORAL_CAPSULE | ORAL | Status: AC
  Filled 2017-02-06: qty 2

## 2017-02-06 NOTE — Assessment & Plan Note (Signed)
He had recent acute on chronic renal failure secondary to poor oral intake, nausea and dehydration I recommend increase oral fluid intake as tolerated We will monitor his kidney function carefully.

## 2017-02-06 NOTE — Assessment & Plan Note (Signed)
He has significant dry skin dermatitis I recommend topical emollient cream If it does not improve, I will put him on some low-dose steroid I encouraged his wife to call me next week for report

## 2017-02-06 NOTE — Telephone Encounter (Signed)
Gave patient AVs and calendar of upcoming February and March appointments.  °

## 2017-02-06 NOTE — Progress Notes (Signed)
[facility] OFFICE PROGRESS NOTE  Patient Care Team: Lucianne Lei, MD as PCP - General (Family Medicine) Heath Lark, MD as Consulting Physician (Hematology and Oncology) Pleasant, [name], RN as [place] [facility] Beverely Pace, LCSW as Social Worker (General Practice)  SUMMARY OF ONCOLOGIC HISTORY:   Multiple myeloma in relapse ([place])   12/12/2010 Initial Diagnosis    Multiple myeloma      02/16/2014 Imaging    Skeletal survey showed diffuse osteopenia      03/02/2014 Bone Marrow Biopsy    Accession: [id number] BM biopsy showed only 5 % plasma cells. However, the biopsy was difficult and the bone was fragmented during the procedure. Cytogenetics and FISH study is normal      03/03/2014 Procedure    he has port placement      03/10/2014 - 05/19/2014 Chemotherapy    he received elotuzumab and revlimid      05/20/2014 - 02/25/2016 Chemotherapy    Treatment is switched to maintenance Revlimid only. Treatment is stopped due to progressive disease      11/15/2014 Adverse Reaction    He has mild worsening anemia. Does of Revlimid reduced to 5 mg 21 days on, 7 days off      03/06/2016 -  Chemotherapy    He received Daratumumab and Velcade. Velcade is stopped on 07/24/16      08/14/2016 Miscellaneous    Pomalyst is added along with Daratumumab      10/17/2016 Surgery    EGD was performed for coffee-ground emesis. Multiple linear superficial ulcers were noted in the esophagus from 25-35 cm from insertion. Severe esophagitis was noted from 30-35 cm from insertion. Multiple biopsies were obtained. A small hiatal hernia was noted. The gastric cavity contained coffee-ground fluid, after suctioning and lavage, no obvious gastric lesions/erosion/ulceration/erythema was noted. Biopsies were taken from the antrum to rule out H. Pylori. Duodenum bulb appeared unremarkable. A small diverticulum was noted in second portion of the duodenum.         12/06/2016 Imaging    Ct scan abdomen No acute findings.  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  Stable small hiatal hernia.  Colonic diverticulosis, without radiographic evidence of diverticulitis.       INTERVAL HISTORY: Please see below for problem oriented charting. He returns with his wife for further follow-up He complained of dry itchy skin throughout No recent infection Appetite is stable, no recent weight loss He denies new bone pain.  REVIEW OF SYSTEMS:   Constitutional: Denies fevers, chills or abnormal weight loss Eyes: Denies blurriness of vision Ears, nose, mouth, throat, and face: Denies mucositis or sore throat Respiratory: Denies cough, dyspnea or wheezes Cardiovascular: Denies palpitation, chest discomfort or lower extremity swelling Gastrointestinal:  Denies nausea, heartburn or change in bowel habits Lymphatics: Denies new lymphadenopathy or easy bruising Neurological:Denies numbness, tingling or new weaknesses Behavioral/Psych: Mood is stable, no new changes  All other systems were reviewed with the patient and are negative.  I have reviewed the past medical history, past surgical history, social history and family history with the patient and they are unchanged from previous note.  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  MEDICATIONS:  Current Outpatient Medications  Medication Sig Dispense Refill  . acyclovir (ZOVIRAX) 400 MG tablet Take 1 tablet (400 mg total) by mouth daily. 60 tablet 11  . allopurinol (ZYLOPRIM) 100 MG tablet Take 100 mg by mouth 2 (two) times daily.     Marland Kitchen  amLODipine (NORVASC) 10 MG tablet Take 10 mg by mouth daily.  1  . aspirin EC 81 MG tablet Take 81 mg by mouth daily.    Marland Kitchen atorvastatin (LIPITOR) 20 MG tablet Take 20 mg by mouth at bedtime.     . brimonidine (ALPHAGAN) 0.15 % ophthalmic solution Place 1 drop into the right eye 3 (three) times daily.    Marland Kitchen CARAFATE 1 GM/10ML  suspension TAKE 10 ML BY MOUTH TWICE DAILY 420 mL 1  . cholecalciferol (VITAMIN D) 1000 UNITS tablet Take 1,000 Units by mouth daily.    . dorzolamide-timolol (COSOPT) 22.3-6.8 MG/ML ophthalmic solution Place 1 drop into both eyes 2 (two) times daily.     . fluorometholone (FML) 0.1 % ophthalmic suspension Place 1 drop into both eyes daily.    Marland Kitchen latanoprost (XALATAN) 0.005 % ophthalmic solution Place 1 drop into the right eye at bedtime.    . lidocaine-prilocaine (EMLA) cream Apply to affected area once 30 g 3  . loperamide (IMODIUM A-D) 2 MG tablet 1 po after each watery BM.  Maximum 6 per 24hrs. 30 tablet PRN  . ondansetron (ZOFRAN ODT) 4 MG disintegrating tablet Take 1 tablet (4 mg total) by mouth every 8 (eight) hours as needed for nausea or vomiting. 20 tablet 1  . ondansetron (ZOFRAN) 8 MG tablet Take 1 tablet (8 mg total) by mouth 2 (two) times daily as needed (Nausea or vomiting). 30 tablet 1  . pantoprazole (PROTONIX) 40 MG tablet Take 1 tablet (40 mg total) by mouth 2 (two) times daily before a meal. 60 tablet 9  . pomalidomide (POMALYST) 2 MG capsule Take 1 capsule (2 mg total) by mouth daily. Take with water on days 1-21. Repeat every 28 days. 21 capsule 0  . prochlorperazine (COMPAZINE) 10 MG tablet Take 1 tablet (10 mg total) by mouth every 6 (six) hours as needed (Nausea or vomiting). (Patient not taking: Reported on 12/06/2016) 30 tablet 1   No current facility-administered medications for this visit.    Facility-Administered Medications Ordered in Other Visits  Medication Dose Route Frequency Provider Last Rate Last Dose  . sodium chloride flush (NS) 0.9 % injection 10 mL  10 mL Intracatheter PRN Alvy Bimler, Mark Benecke, MD   10 mL at 02/06/17 1244  . sodium chloride flush (NS) 0.9 % injection 10 mL  10 mL Intracatheter PRN Alvy Bimler, Amity Roes, MD        PHYSICAL EXAMINATION: ECOG PERFORMANCE STATUS: 1 - Symptomatic but completely ambulatory  Vitals:   02/06/17 0904  BP: (!) 141/61  Pulse: 85   Resp: 18  Temp: (!) 97.5 F (36.4 C)  SpO2: 100%   Filed Weights   02/06/17 0904  Weight: 154 lb 1.6 oz (69.9 kg)    GENERAL:alert, no distress and comfortable SKIN: He has dry skin throughout EYES: normal, Conjunctiva are pink and non-injected, sclera clear OROPHARYNX:no exudate, no erythema and lips, buccal mucosa, and tongue normal  NECK: supple, thyroid normal size, non-tender, without nodularity LYMPH:  no palpable lymphadenopathy in the cervical, axillary or inguinal LUNGS: clear to auscultation and percussion with normal breathing effort HEART: regular rate & rhythm and no murmurs and no lower extremity edema ABDOMEN:abdomen soft, non-tender and normal bowel sounds Musculoskeletal:no cyanosis of digits and no clubbing  NEURO: alert & oriented x 3 with fluent speech, no focal motor/sensory deficits  LABORATORY DATA:  I have reviewed the data as listed    Component Value Date/Time   NA 140 02/06/2017 0812   NA  140 01/09/2017 0913   K 3.8 02/06/2017 0812   K 3.7 01/09/2017 0913   CL 109 02/06/2017 0812   CL 107 06/29/2012 1131   CO2 20 (L) 02/06/2017 0812   CO2 21 (L) 01/09/2017 0913   GLUCOSE 82 02/06/2017 0812   GLUCOSE 80 01/09/2017 0913   GLUCOSE 143 (H) 06/29/2012 1131   BUN 16 02/06/2017 0812   BUN 9.8 01/09/2017 0913   CREATININE 1.81 (H) 02/06/2017 0812   CREATININE 1.5 (H) 01/09/2017 0913   CALCIUM 8.2 (L) 02/06/2017 0812   CALCIUM 7.5 (L) 01/09/2017 0913   PROT 6.6 02/06/2017 0812   PROT 5.7 (L) 01/09/2017 0913   ALBUMIN 3.2 (L) 02/06/2017 0812   ALBUMIN 2.9 (L) 01/09/2017 0913   AST 14 02/06/2017 0812   AST 12 01/09/2017 0913   ALT 8 02/06/2017 0812   ALT 10 01/09/2017 0913   ALKPHOS 56 02/06/2017 0812   ALKPHOS 55 01/09/2017 0913   BILITOT 0.5 02/06/2017 0812   BILITOT 0.44 01/09/2017 0913   GFRNONAA 35 (L) 02/06/2017 0812   GFRAA 41 (L) 02/06/2017 0812    No results found for: SPEP, UPEP  Lab Results  Component Value Date   WBC 4.8  02/06/2017   NEUTROABS 1.6 02/06/2017   HGB 9.6 (L) 02/06/2017   HCT 29.3 (L) 02/06/2017   MCV 98.9 (H) 02/06/2017   PLT 364 02/06/2017      Chemistry      Component Value Date/Time   NA 140 02/06/2017 0812   NA 140 01/09/2017 0913   K 3.8 02/06/2017 0812   K 3.7 01/09/2017 0913   CL 109 02/06/2017 0812   CL 107 06/29/2012 1131   CO2 20 (L) 02/06/2017 0812   CO2 21 (L) 01/09/2017 0913   BUN 16 02/06/2017 0812   BUN 9.8 01/09/2017 0913   CREATININE 1.81 (H) 02/06/2017 0812   CREATININE 1.5 (H) 01/09/2017 0913      Component Value Date/Time   CALCIUM 8.2 (L) 02/06/2017 0812   CALCIUM 7.5 (L) 01/09/2017 0913   ALKPHOS 56 02/06/2017 0812   ALKPHOS 55 01/09/2017 0913   AST 14 02/06/2017 0812   AST 12 01/09/2017 0913   ALT 8 02/06/2017 0812   ALT 10 01/09/2017 0913   BILITOT 0.5 02/06/2017 0812   BILITOT 0.44 01/09/2017 0913      ASSESSMENT & PLAN:  Multiple myeloma in relapse ([place]) Recent myeloma panel is stable Clinically, he appears to be responding very well to treatment except for mild pancytopenia of which he is not symptomatic I would proceed with treatment regardless of CBC He will continue treatment with Pomalyst 3 weeks on 1 week off. He will continue Daratumumab as scheduled every 4 weeks He is reminded to take calcium with vitamin D He will get Zometa every 6 months  Dry skin dermatitis He has significant dry skin dermatitis I recommend topical emollient cream If it does not improve, I will put him on some low-dose steroid I encouraged his wife to call me next week for report  Deficiency anemia He has multifactorial anemia with component of iron deficiency I recommend intravenous iron infusion today  Chronic renal insufficiency, stage III (moderate) He had recent acute on chronic renal failure secondary to poor oral intake, nausea and dehydration I recommend increase oral fluid intake as tolerated We will monitor his kidney function  carefully.   Orders Placed This Encounter  Procedures  . Ferritin    Standing Status:   Future  Standing Expiration Date:   03/13/2018  . Iron and TIBC    Standing Status:   Future    Standing Expiration Date:   03/13/2018   All questions were answered. The patient knows to call the clinic with any problems, questions or concerns. No barriers to learning was detected. I spent 15 minutes counseling the patient face to face. The total time spent in the appointment was 20 minutes and more than 50% was on counseling and review of test results     Heath Lark, MD 02/06/2017 1:00 PM

## 2017-02-06 NOTE — Patient Instructions (Signed)
[facility] Discharge Instructions for Patients Receiving Chemotherapy  Today you received the following chemotherapy agents: Darzalex  To help prevent nausea and vomiting after your treatment, we encourage you to take your nausea medication as directed.    If you develop nausea and vomiting that is not controlled by your nausea medication, call the clinic.   BELOW ARE SYMPTOMS THAT SHOULD BE REPORTED IMMEDIATELY:  *FEVER GREATER THAN 100.5 F  *CHILLS WITH OR WITHOUT FEVER  NAUSEA AND VOMITING THAT IS NOT CONTROLLED WITH YOUR NAUSEA MEDICATION  *UNUSUAL SHORTNESS OF BREATH  *UNUSUAL BRUISING OR BLEEDING  TENDERNESS IN MOUTH AND THROAT WITH OR WITHOUT PRESENCE OF ULCERS  *URINARY PROBLEMS  *BOWEL PROBLEMS  UNUSUAL RASH Items with * indicate a potential emergency and should be followed up as soon as possible.  Feel free to call the clinic should you have any questions or concerns. The clinic phone number is [phone].  Please show the CHEMO ALERT CARD at check-in to the Emergency Department and triage nurse.   

## 2017-02-06 NOTE — Assessment & Plan Note (Signed)
Recent myeloma panel is stable Clinically, he appears to be responding very well to treatment except for mild pancytopenia of which he is not symptomatic I would proceed with treatment regardless of CBC He will continue treatment with Pomalyst 3 weeks on 1 week off. He will continue Daratumumab as scheduled every 4 weeks He is reminded to take calcium with vitamin D He will get Zometa every 6 months

## 2017-02-06 NOTE — Assessment & Plan Note (Signed)
He has multifactorial anemia with component of iron deficiency I recommend intravenous iron infusion today

## 2017-02-07 DIAGNOSIS — N189 Chronic kidney disease, unspecified: Secondary | ICD-10-CM | POA: Diagnosis not present

## 2017-02-07 DIAGNOSIS — I10 Essential (primary) hypertension: Secondary | ICD-10-CM | POA: Diagnosis not present

## 2017-02-07 DIAGNOSIS — R609 Edema, unspecified: Secondary | ICD-10-CM | POA: Diagnosis not present

## 2017-02-07 DIAGNOSIS — C9000 Multiple myeloma not having achieved remission: Secondary | ICD-10-CM | POA: Diagnosis not present

## 2017-02-07 LAB — KAPPA/LAMBDA LIGHT CHAINS
KAPPA, LAMDA LIGHT CHAIN RATIO: 12.21 — AB (ref 0.26–1.65)
Kappa free light chain: 123.3 mg/L — ABNORMAL HIGH (ref 3.3–19.4)
LAMDA FREE LIGHT CHAINS: 10.1 mg/L (ref 5.7–26.3)

## 2017-02-10 LAB — MULTIPLE MYELOMA PANEL, SERUM
ALBUMIN SERPL ELPH-MCNC: 3.4 g/dL (ref 2.9–4.4)
ALPHA 1: 0.3 g/dL (ref 0.0–0.4)
Albumin/Glob SerPl: 1.3 (ref 0.7–1.7)
Alpha2 Glob SerPl Elph-Mcnc: 0.7 g/dL (ref 0.4–1.0)
B-Globulin SerPl Elph-Mcnc: 0.8 g/dL (ref 0.7–1.3)
Gamma Glob SerPl Elph-Mcnc: 1 g/dL (ref 0.4–1.8)
Globulin, Total: 2.7 g/dL (ref 2.2–3.9)
IGA: 20 mg/dL — AB (ref 61–437)
IGG (IMMUNOGLOBIN G), SERUM: 1214 mg/dL (ref 700–1600)
IGM (IMMUNOGLOBULIN M), SRM: 9 mg/dL — AB (ref 15–143)
M Protein SerPl Elph-Mcnc: 0.8 g/dL — ABNORMAL HIGH
Total Protein ELP: 6.1 g/dL (ref 6.0–8.5)

## 2017-02-11 ENCOUNTER — Ambulatory Visit (HOSPITAL_COMMUNITY): Payer: Medicare Other | Admitting: Dentistry

## 2017-02-11 ENCOUNTER — Encounter (HOSPITAL_COMMUNITY): Payer: Self-pay | Admitting: Dentistry

## 2017-02-11 VITALS — BP 131/50 | HR 54 | Temp 97.9°F

## 2017-02-11 DIAGNOSIS — C9000 Multiple myeloma not having achieved remission: Secondary | ICD-10-CM | POA: Diagnosis not present

## 2017-02-11 DIAGNOSIS — K08109 Complete loss of teeth, unspecified cause, unspecified class: Secondary | ICD-10-CM

## 2017-02-11 DIAGNOSIS — K0820 Unspecified atrophy of edentulous alveolar ridge: Secondary | ICD-10-CM

## 2017-02-11 DIAGNOSIS — Z972 Presence of dental prosthetic device (complete) (partial): Secondary | ICD-10-CM

## 2017-02-11 DIAGNOSIS — Z463 Encounter for fitting and adjustment of dental prosthetic device: Secondary | ICD-10-CM

## 2017-02-11 NOTE — Progress Notes (Signed)
02/11/2017  Patient Name:   Calvin Mccormick Date of Birth:   [birth year] Medical Record Number: [id number]  BP (!) 131/50 (BP Location: Left Arm)   Pulse (!) 54   Temp 97.9 F (36.6 C)   Calvin Mccormick is a 72 year old male that presents for periodic oral exam and evaluation of upper and lower complete dentures. Patient had extraction of remaining teeth with alveoloplasty on 05/27/2014 as part of a pre-Zometa therapy dental protocol. Upper and lower complete dentures were then fabricated and inserted on 09/01/2014. Patient has been seen for multiple denture adjustment appointment since then. Patient has multiple myeloma that is in relapse. Patient now having Zometa therapy every 6 months with Dr. Alvy Bimler. Patient now presents for a periodic oral examination and evaluation of upper and lower complete dentures.  Medical Hx Update:  Past Medical History:  Diagnosis Date  . Anemia 11/27/2010  . Cancer ([place])   . Chronic renal insufficiency, stage III (moderate) ([place]) 06/17/2011   Due to myeloma & HTN  . CKD (chronic kidney disease), stage III ([place]) 02/05/2011  . 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 Diarrhea 06/17/2011   Hospital admission 05/30/11 velcade toxicity? Infectious?  Marland Kitchen Hyperlipemia   . Hypertension, benign essential, goal below 140/90 11/27/2010  . Hypokalemia with normal acid-base balance 07/29/2011  . Hypomagnesemia 07/29/2011  . Seasonal allergies   .  Past Surgical History:  Procedure Laterality Date  . COLONOSCOPY  07/26/2011   Procedure: COLONOSCOPY;  Surgeon: Cleotis Nipper, MD;  Location: [facility] ENDOSCOPY;  Service: Endoscopy;  Laterality: N/A;  . ESOPHAGOGASTRODUODENOSCOPY (EGD) WITH PROPOFOL Left 10/17/2016   Procedure: ESOPHAGOGASTRODUODENOSCOPY (EGD) WITH PROPOFOL;  Surgeon: Ronnette Juniper, MD;  Location: [facility] ENDOSCOPY;  Service: Gastroenterology;  Laterality: Left;  . EYE SURGERY     bilateral cataract  surgery  . MULTIPLE EXTRACTIONS WITH ALVEOLOPLASTY N/A 05/27/2014   Procedure:  Extraction of tooth #'s 1,6,7,8,10,11,13,21,22,23,24,25,26,27 and 28 with alveoloplasty;  Surgeon: Lenn Cal, DDS;  Location: [facility] ORS;  Service: Oral Surgery;  Laterality: N/A;  . porta cath      for chemotherapy- right chest area    ALLERGIES/ADVERSE DRUG REACTIONS: Allergies  Allergen Reactions  . Lactose Intolerance (Gi) Nausea And Vomiting    Pt said he is not ??    MEDICATIONS: Current Outpatient Medications  Medication Sig Dispense Refill  . acyclovir (ZOVIRAX) 400 MG tablet Take 1 tablet (400 mg total) by mouth daily. 60 tablet 11  . allopurinol (ZYLOPRIM) 100 MG tablet Take 100 mg by mouth 2 (two) times daily.     Marland Kitchen amLODipine (NORVASC) 10 MG tablet Take 10 mg by mouth daily.  1  . aspirin EC 81 MG tablet Take 81 mg by mouth daily.    Marland Kitchen atorvastatin (LIPITOR) 20 MG tablet Take 20 mg by mouth at bedtime.     . brimonidine (ALPHAGAN) 0.15 % ophthalmic solution Place 1 drop into the right eye 3 (three) times daily.    Marland Kitchen CARAFATE 1 GM/10ML suspension TAKE 10 ML BY MOUTH TWICE DAILY 420 mL 1  . cholecalciferol (VITAMIN D) 1000 UNITS tablet Take 1,000 Units by mouth daily.    . dorzolamide-timolol (COSOPT) 22.3-6.8 MG/ML ophthalmic solution Place 1 drop into both eyes 2 (two) times daily.     . fluorometholone (FML) 0.1 % ophthalmic suspension Place 1 drop into both eyes daily.    Marland Kitchen latanoprost (XALATAN) 0.005 % ophthalmic solution Place 1 drop into the right eye at bedtime.    . lidocaine-prilocaine (  EMLA) cream Apply to affected area once 30 g 3  . loperamide (IMODIUM A-D) 2 MG tablet 1 po after each watery BM.  Maximum 6 per 24hrs. 30 tablet PRN  . ondansetron (ZOFRAN ODT) 4 MG disintegrating tablet Take 1 tablet (4 mg total) by mouth every 8 (eight) hours as needed for nausea or vomiting. 20 tablet 1  . ondansetron (ZOFRAN) 8 MG tablet Take 1 tablet (8 mg total) by mouth 2 (two) times daily as needed (Nausea or vomiting). 30 tablet 1  . pantoprazole (PROTONIX) 40 MG tablet  Take 1 tablet (40 mg total) by mouth 2 (two) times daily before a meal. 60 tablet 9  . pomalidomide (POMALYST) 2 MG capsule Take 1 capsule (2 mg total) by mouth daily. Take with water on days 1-21. Repeat every 28 days. 21 capsule 0  . prochlorperazine (COMPAZINE) 10 MG tablet Take 1 tablet (10 mg total) by mouth every 6 (six) hours as needed (Nausea or vomiting). (Patient not taking: Reported on 12/06/2016) 30 tablet 1   No current facility-administered medications for this visit.     C/C: Evaluation of upper lower complete dentures.   HPI:  Calvin Mccormick is a 72 year old male that presents for periodic oral exam and evaluation of upper and lower complete dentures. Patient had extraction of remaining teeth with alveoloplasty on 05/27/2014 as part of a pre-Zometa therapy dental protocol. Upper and lower complete dentures were then fabricated and inserted on 09/01/2014. Patient has been seen for multiple denture adjustment appointment since then. Patient has multiple myeloma that is in relapse. Patient now having Zometa therapy every 6 months with Dr. Alvy Bimler. Patient now presents for a periodic oral examination and evaluation of upper and lower complete dentures.  Patient currently denies having any problems with his upper and lower complete dentures.  DENTAL EXAM: General: Patient is a well-developed, well-nourished male in no acute distress. Vitals: BP (!) 131/50 (BP Location: Left Arm)   Pulse (!) 54   Temp 97.9 F (36.6 C)  Extraoral Exam: There is no palpable lymphadenopathy. The patient denies acute TMJ symptoms. Intraoral  Exam: Patient has normal saliva. There is no evidence of denture irritation. There is atrophy of the edentulous alveolar ridges. There is no evidence of osteonecrosis of the jaw. Dentition: Patient is edentulous. Prosthodontic: Patient has upper and lower complete dentures. The dentures are stable and retentive. Pressure indicating paste was applied to the dentures  and minimal adjustments made as needed. Dentures were polished. Occlusion:  Dentures were minimally adjusted in centric relation and protrusive strokes. Patient with tendency to protrude lower denture. Pateint was again instructed on finding maximum intercuspation postion. Patient expressed understanding.  Assessments: 1. Multiple myeloma in relapse 2. Zometa therapy every 6 months  3. Completely edentulous 4. Atrophy of the edentulous alveolar ridges 5. Upper and lower complete dentures are stable.  Plan:  1. Keep dentures out if sore spots arise. 2. Use salt water rinses as needed to aid healing. 3. Return to clinic as scheduled for denture evaluation. Call if problems arise before then.   Lenn Cal, DDS

## 2017-02-11 NOTE — Patient Instructions (Signed)
Plan:  1. Keep dentures out if sore spots arise. 2. Use salt water rinses as needed to aid healing. 3. Return to clinic as scheduled for denture evaluation. Call if problems arise before then.   Hawraa Stambaugh F. Cassanda Walmer, DDS 

## 2017-02-24 ENCOUNTER — Telehealth: Payer: Self-pay | Admitting: *Deleted

## 2017-02-24 DIAGNOSIS — C9002 Multiple myeloma in relapse: Secondary | ICD-10-CM

## 2017-02-24 MED ORDER — POMALIDOMIDE 2 MG PO CAPS: 2.0000 mg | ORAL_CAPSULE | Freq: Every day | ORAL | 0 refills | Status: DC

## 2017-02-24 NOTE — Telephone Encounter (Signed)
Received call from [facility] stating that pt called for refill on his pomalyst & needs by fri.  Informed script would be sent.

## 2017-03-06 ENCOUNTER — Inpatient Hospital Stay: Payer: Medicare Other | Attending: Hematology and Oncology

## 2017-03-06 ENCOUNTER — Encounter: Payer: Self-pay | Admitting: Hematology and Oncology

## 2017-03-06 ENCOUNTER — Inpatient Hospital Stay (HOSPITAL_BASED_OUTPATIENT_CLINIC_OR_DEPARTMENT_OTHER): Payer: Medicare Other | Admitting: Hematology and Oncology

## 2017-03-06 ENCOUNTER — Inpatient Hospital Stay: Payer: Medicare Other

## 2017-03-06 ENCOUNTER — Telehealth: Payer: Self-pay

## 2017-03-06 VITALS — BP 113/56 | HR 60 | Temp 98.0°F | Resp 16

## 2017-03-06 DIAGNOSIS — Z7982 Long term (current) use of aspirin: Secondary | ICD-10-CM | POA: Insufficient documentation

## 2017-03-06 DIAGNOSIS — D631 Anemia in chronic kidney disease: Secondary | ICD-10-CM | POA: Diagnosis not present

## 2017-03-06 DIAGNOSIS — R21 Rash and other nonspecific skin eruption: Secondary | ICD-10-CM | POA: Insufficient documentation

## 2017-03-06 DIAGNOSIS — N183 Chronic kidney disease, stage 3 unspecified: Secondary | ICD-10-CM

## 2017-03-06 DIAGNOSIS — L853 Xerosis cutis: Secondary | ICD-10-CM | POA: Insufficient documentation

## 2017-03-06 DIAGNOSIS — C9002 Multiple myeloma in relapse: Secondary | ICD-10-CM | POA: Insufficient documentation

## 2017-03-06 DIAGNOSIS — Z79899 Other long term (current) drug therapy: Secondary | ICD-10-CM

## 2017-03-06 DIAGNOSIS — E876 Hypokalemia: Secondary | ICD-10-CM | POA: Diagnosis not present

## 2017-03-06 DIAGNOSIS — Z5112 Encounter for antineoplastic immunotherapy: Secondary | ICD-10-CM | POA: Insufficient documentation

## 2017-03-06 DIAGNOSIS — D500 Iron deficiency anemia secondary to blood loss (chronic): Secondary | ICD-10-CM

## 2017-03-06 LAB — COMPREHENSIVE METABOLIC PANEL
ALK PHOS: 57 U/L (ref 40–150)
ALT: 11 U/L (ref 0–55)
ANION GAP: 11 (ref 3–11)
AST: 12 U/L (ref 5–34)
Albumin: 2.7 g/dL — ABNORMAL LOW (ref 3.5–5.0)
BILIRUBIN TOTAL: 0.7 mg/dL (ref 0.2–1.2)
BUN: 10 mg/dL (ref 7–26)
CALCIUM: 7.3 mg/dL — AB (ref 8.4–10.4)
CO2: 22 mmol/L (ref 22–29)
Chloride: 105 mmol/L (ref 98–109)
Creatinine, Ser: 1.66 mg/dL — ABNORMAL HIGH (ref 0.70–1.30)
GFR, EST AFRICAN AMERICAN: 45 mL/min — AB (ref >60)
GFR, EST NON AFRICAN AMERICAN: 39 mL/min — AB (ref >60)
GLUCOSE: 136 mg/dL (ref 70–140)
Potassium: 3.1 mmol/L — ABNORMAL LOW (ref 3.5–5.1)
Sodium: 138 mmol/L (ref 136–145)
TOTAL PROTEIN: 6.4 g/dL (ref 6.4–8.3)

## 2017-03-06 LAB — CBC WITH DIFFERENTIAL/PLATELET
Basophils Absolute: 0.1 10*3/uL (ref 0.0–0.1)
Basophils Relative: 2 %
Eosinophils Absolute: 0.3 10*3/uL (ref 0.0–0.5)
Eosinophils Relative: 5 %
HEMATOCRIT: 25.8 % — AB (ref 38.4–49.9)
HEMOGLOBIN: 8.8 g/dL — AB (ref 13.0–17.1)
LYMPHS ABS: 2.1 10*3/uL (ref 0.9–3.3)
Lymphocytes Relative: 39 %
MCH: 32.7 pg (ref 27.2–33.4)
MCHC: 34.1 g/dL (ref 32.0–36.0)
MCV: 95.9 fL (ref 79.3–98.0)
MONOS PCT: 12 %
Monocytes Absolute: 0.7 10*3/uL (ref 0.1–0.9)
NEUTROS ABS: 2.4 10*3/uL (ref 1.5–6.5)
NEUTROS PCT: 42 %
Platelets: 325 10*3/uL (ref 140–400)
RBC: 2.69 MIL/uL — ABNORMAL LOW (ref 4.20–5.82)
RDW: 15.6 % — ABNORMAL HIGH (ref 11.0–14.6)
WBC: 5.5 10*3/uL (ref 4.0–10.3)

## 2017-03-06 LAB — IRON AND TIBC
IRON: 27 ug/dL — AB (ref 42–163)
SATURATION RATIOS: 17 % — AB (ref 42–163)
TIBC: 158 ug/dL — ABNORMAL LOW (ref 202–409)
UIBC: 131 ug/dL

## 2017-03-06 LAB — FERRITIN: Ferritin: 750 ng/mL — ABNORMAL HIGH (ref 22–316)

## 2017-03-06 MED ORDER — METHYLPREDNISOLONE SODIUM SUCC 40 MG IJ SOLR
INTRAMUSCULAR | Status: AC
  Filled 2017-03-06: qty 1

## 2017-03-06 MED ORDER — DIPHENHYDRAMINE HCL 25 MG PO CAPS
50.0000 mg | ORAL_CAPSULE | Freq: Once | ORAL | Status: AC
  Administered 2017-03-06: 50 mg via ORAL

## 2017-03-06 MED ORDER — PROCHLORPERAZINE MALEATE 10 MG PO TABS
10.0000 mg | ORAL_TABLET | Freq: Once | ORAL | Status: AC
  Administered 2017-03-06: 10 mg via ORAL

## 2017-03-06 MED ORDER — ACETAMINOPHEN 325 MG PO TABS
ORAL_TABLET | ORAL | Status: AC
  Filled 2017-03-06: qty 2

## 2017-03-06 MED ORDER — SODIUM CHLORIDE 0.9 % IV SOLN
Freq: Once | INTRAVENOUS | Status: AC
  Administered 2017-03-06: 11:00:00 via INTRAVENOUS

## 2017-03-06 MED ORDER — SODIUM CHLORIDE 0.9% FLUSH
10.0000 mL | INTRAVENOUS | Status: DC | PRN
  Administered 2017-03-06: 10 mL
  Filled 2017-03-06: qty 10

## 2017-03-06 MED ORDER — SODIUM CHLORIDE 0.9% FLUSH
10.0000 mL | Freq: Once | INTRAVENOUS | Status: AC
  Administered 2017-03-06: 10 mL via INTRAVENOUS
  Filled 2017-03-06: qty 10

## 2017-03-06 MED ORDER — PROCHLORPERAZINE MALEATE 10 MG PO TABS
ORAL_TABLET | ORAL | Status: AC
  Filled 2017-03-06: qty 1

## 2017-03-06 MED ORDER — HEPARIN SOD (PORK) LOCK FLUSH 100 UNIT/ML IV SOLN
500.0000 [IU] | Freq: Once | INTRAVENOUS | Status: AC | PRN
  Administered 2017-03-06: 500 [IU]
  Filled 2017-03-06: qty 5

## 2017-03-06 MED ORDER — DIPHENHYDRAMINE HCL 25 MG PO CAPS
ORAL_CAPSULE | ORAL | Status: AC
  Filled 2017-03-06: qty 2

## 2017-03-06 MED ORDER — METHYLPREDNISOLONE SODIUM SUCC 40 MG IJ SOLR
40.0000 mg | Freq: Once | INTRAMUSCULAR | Status: AC
  Administered 2017-03-06: 40 mg via INTRAVENOUS

## 2017-03-06 MED ORDER — SODIUM CHLORIDE 0.9 % IV SOLN
16.8000 mg/kg | Freq: Once | INTRAVENOUS | Status: AC
  Administered 2017-03-06: 1100 mg via INTRAVENOUS
  Filled 2017-03-06: qty 55

## 2017-03-06 MED ORDER — ACETAMINOPHEN 325 MG PO TABS
650.0000 mg | ORAL_TABLET | Freq: Once | ORAL | Status: AC
  Administered 2017-03-06: 650 mg via ORAL

## 2017-03-06 NOTE — Assessment & Plan Note (Signed)
He had significant hypokalemia and hypocalcemia I will hold Zometa recommend increase calcium and vitamin D supplement

## 2017-03-06 NOTE — Assessment & Plan Note (Signed)
He has significant hypokalemia He denies diarrhea I recommend potassium rich diet

## 2017-03-06 NOTE — Progress Notes (Signed)
[facility] OFFICE PROGRESS NOTE  Patient Care Team: Calvin Lei, MD as PCP - General (Family Medicine) Calvin Lark, MD as Consulting Physician (Hematology and Oncology) Pleasant, Calvin Gibson, RN as [facility] Calvin Pace, LCSW as Veterinary surgeon)  ASSESSMENT & PLAN:  Multiple myeloma in relapse [facility]) He tolerated the addition of Pomalyst poorly due to skin rash and pancytopenia Recent myeloma panel suggest disease progression Repeat panel today is pending I plan to see him back again in 11 days to review test results If repeat myeloma panel confirmed disease progression, I will stop current treatment and consider switching him over to Cytoxan, Kyprolis and dexamethasone  Anemia in chronic renal disease He has multifactorial anemia, combination of anemia chronic kidney disease and recent blood loss/iron deficiency anemia His hemoglobin is stable.  Continue close observation  Chronic renal insufficiency, stage III (moderate) He had recent acute on chronic renal failure secondary to poor oral intake, nausea and dehydration I recommend increase oral fluid intake as tolerated We will monitor his kidney function carefully.  Dry skin dermatitis He has significant dry skin dermatitis I recommend topical emollient cream If it does not improve, I will put him on some low-dose steroid It also could be exacerbated by Pomalyst I will observe only for now and reassess in his next visit  Hypocalcemia He had significant hypokalemia and hypocalcemia I will hold Zometa recommend increase calcium and vitamin D supplement  Hypokalemia He has significant hypokalemia He denies diarrhea I recommend potassium rich diet   No orders of the defined types were placed in this encounter.   INTERVAL HISTORY: Please see below for problem oriented charting. He returns for further follow-up He denies bone pain Denies recent nausea,  vomiting or diarrhea His weight is stable He complained of skin dryness and itchiness again when he resume Pomalyst The patient denies any recent signs or symptoms of bleeding such as spontaneous epistaxis, hematuria or hematochezia. He denies recent infection  SUMMARY OF ONCOLOGIC HISTORY:   Multiple myeloma in relapse ([place])   12/12/2010 Initial Diagnosis    Multiple myeloma      02/16/2014 Imaging    Skeletal survey showed diffuse osteopenia      03/02/2014 Bone Marrow Biopsy    Accession: [id number] BM biopsy showed only 5 % plasma cells. However, the biopsy was difficult and the bone was fragmented during the procedure. Cytogenetics and FISH study is normal      03/03/2014 Procedure    he has port placement      03/10/2014 - 05/19/2014 Chemotherapy    he received elotuzumab and revlimid      05/20/2014 - 02/25/2016 Chemotherapy    Treatment is switched to maintenance Revlimid only. Treatment is stopped due to progressive disease      11/15/2014 Adverse Reaction    He has mild worsening anemia. Does of Revlimid reduced to 5 mg 21 days on, 7 days off      03/06/2016 -  Chemotherapy    He received Daratumumab and Velcade. Velcade is stopped on 07/24/16      08/14/2016 Miscellaneous    Pomalyst is added along with Daratumumab      10/17/2016 Surgery    EGD was performed for coffee-ground emesis. Multiple linear superficial ulcers were noted in the esophagus from 25-35 cm from insertion. Severe esophagitis was noted from 30-35 cm from insertion. Multiple biopsies were obtained. A small hiatal hernia was noted. The gastric cavity contained coffee-ground fluid,  after suctioning and lavage, no obvious gastric lesions/erosion/ulceration/erythema was noted. Biopsies were taken from the antrum to rule out H. Pylori. Duodenum bulb appeared unremarkable. A small diverticulum was noted in second portion of the duodenum.        12/06/2016 Imaging    Ct scan abdomen No acute  findings.  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  Stable small hiatal hernia.  Colonic diverticulosis, without radiographic evidence of diverticulitis.       REVIEW OF SYSTEMS:   Constitutional: Denies fevers, chills or abnormal weight loss Eyes: Denies blurriness of vision Ears, nose, mouth, throat, and face: Denies mucositis or sore throat Respiratory: Denies cough, dyspnea or wheezes Cardiovascular: Denies palpitation, chest discomfort or lower extremity swelling Gastrointestinal:  Denies nausea, heartburn or change in bowel habits Lymphatics: Denies new lymphadenopathy or easy bruising Neurological:Denies numbness, tingling or new weaknesses Behavioral/Psych: Mood is stable, no new changes  All other systems were reviewed with the patient and are negative.  I have reviewed the past medical history, past surgical history, social history and family history with the patient and they are unchanged from previous note.  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  MEDICATIONS:  Current Outpatient Medications  Medication Sig Dispense Refill  . acyclovir (ZOVIRAX) 400 MG tablet Take 1 tablet (400 mg total) by mouth daily. 60 tablet 11  . allopurinol (ZYLOPRIM) 100 MG tablet Take 100 mg by mouth 2 (two) times daily.     Marland Kitchen amLODipine (NORVASC) 10 MG tablet Take 10 mg by mouth daily.  1  . aspirin EC 81 MG tablet Take 81 mg by mouth daily.    Marland Kitchen atorvastatin (LIPITOR) 20 MG tablet Take 20 mg by mouth at bedtime.     . brimonidine (ALPHAGAN) 0.15 % ophthalmic solution Place 1 drop into the right eye 3 (three) times daily.    Marland Kitchen CARAFATE 1 GM/10ML suspension TAKE 10 ML BY MOUTH TWICE DAILY 420 mL 1  . cholecalciferol (VITAMIN D) 1000 UNITS tablet Take 1,000 Units by mouth daily.    . dorzolamide-timolol (COSOPT) 22.3-6.8 MG/ML ophthalmic solution Place 1 drop into both eyes 2 (two) times daily.     . fluorometholone (FML) 0.1 % ophthalmic  suspension Place 1 drop into both eyes daily.    Marland Kitchen latanoprost (XALATAN) 0.005 % ophthalmic solution Place 1 drop into the right eye at bedtime.    . lidocaine-prilocaine (EMLA) cream Apply to affected area once 30 g 3  . loperamide (IMODIUM A-D) 2 MG tablet 1 po after each watery BM.  Maximum 6 per 24hrs. 30 tablet PRN  . ondansetron (ZOFRAN ODT) 4 MG disintegrating tablet Take 1 tablet (4 mg total) by mouth every 8 (eight) hours as needed for nausea or vomiting. 20 tablet 1  . ondansetron (ZOFRAN) 8 MG tablet Take 1 tablet (8 mg total) by mouth 2 (two) times daily as needed (Nausea or vomiting). 30 tablet 1  . pantoprazole (PROTONIX) 40 MG tablet Take 1 tablet (40 mg total) by mouth 2 (two) times daily before a meal. 60 tablet 9  . pomalidomide (POMALYST) 2 MG capsule Take 1 capsule (2 mg total) by mouth daily. Take with water on days 1-21. Repeat every 28 days. 21 capsule 0  . prochlorperazine (COMPAZINE) 10 MG tablet Take 1 tablet (10 mg total) by mouth every 6 (six) hours as needed (Nausea or vomiting). (Patient not taking: Reported on 12/06/2016) 30 tablet 1   No current facility-administered medications for this visit.  Facility-Administered Medications Ordered in Other Visits  Medication Dose Route Frequency Provider Last Rate Last Dose  . daratumumab (DARZALEX) 1,100 mg in sodium chloride 0.9 % 445 mL chemo infusion  16.8 mg/kg (Order-Specific) Intravenous Once Alvy Bimler, Dianne Whelchel, MD      . heparin lock flush 100 unit/mL  500 Units Intracatheter Once PRN Alvy Bimler, Torii Royse, MD      . sodium chloride flush (NS) 0.9 % injection 10 mL  10 mL Intracatheter PRN Alvy Bimler, Sacora Hawbaker, MD        PHYSICAL EXAMINATION: ECOG PERFORMANCE STATUS: 1 - Symptomatic but completely ambulatory  Vitals:   03/06/17 1034  BP: 131/66  Pulse: 75  Resp: 18  Temp: 98.7 F (37.1 C)  SpO2: 100%   Filed Weights   03/06/17 1034  Weight: 152 lb (68.9 kg)    GENERAL:alert, no distress and comfortable.  He looks thin and  frail SKIN: Noted significant dry skin EYES: normal, Conjunctiva are pink and non-injected, sclera clear OROPHARYNX:no exudate, no erythema and lips, buccal mucosa, and tongue normal  NECK: supple, thyroid normal size, non-tender, without nodularity LYMPH:  no palpable lymphadenopathy in the cervical, axillary or inguinal LUNGS: clear to auscultation and percussion with normal breathing effort HEART: regular rate & rhythm and no murmurs and no lower extremity edema ABDOMEN:abdomen soft, non-tender and normal bowel sounds Musculoskeletal:no cyanosis of digits and no clubbing  NEURO: alert & oriented x 3 with fluent speech, no focal motor/sensory deficits  LABORATORY DATA:  I have reviewed the data as listed    Component Value Date/Time   NA 138 03/06/2017 0907   NA 140 01/09/2017 0913   K 3.1 (L) 03/06/2017 0907   K 3.7 01/09/2017 0913   CL 105 03/06/2017 0907   CL 107 06/29/2012 1131   CO2 22 03/06/2017 0907   CO2 21 (L) 01/09/2017 0913   GLUCOSE 136 03/06/2017 0907   GLUCOSE 80 01/09/2017 0913   GLUCOSE 143 (H) 06/29/2012 1131   BUN 10 03/06/2017 0907   BUN 9.8 01/09/2017 0913   CREATININE 1.66 (H) 03/06/2017 0907   CREATININE 1.5 (H) 01/09/2017 0913   CALCIUM 7.3 (L) 03/06/2017 0907   CALCIUM 7.5 (L) 01/09/2017 0913   PROT 6.4 03/06/2017 0907   PROT 5.7 (L) 01/09/2017 0913   ALBUMIN 2.7 (L) 03/06/2017 0907   ALBUMIN 2.9 (L) 01/09/2017 0913   AST 12 03/06/2017 0907   AST 12 01/09/2017 0913   ALT 11 03/06/2017 0907   ALT 10 01/09/2017 0913   ALKPHOS 57 03/06/2017 0907   ALKPHOS 55 01/09/2017 0913   BILITOT 0.7 03/06/2017 0907   BILITOT 0.44 01/09/2017 0913   GFRNONAA 39 (L) 03/06/2017 0907   GFRAA 45 (L) 03/06/2017 0907    No results found for: SPEP, UPEP  Lab Results  Component Value Date   WBC 5.5 03/06/2017   NEUTROABS 2.4 03/06/2017   HGB 8.8 (L) 03/06/2017   HCT 25.8 (L) 03/06/2017   MCV 95.9 03/06/2017   PLT 325 03/06/2017      Chemistry       Component Value Date/Time   NA 138 03/06/2017 0907   NA 140 01/09/2017 0913   K 3.1 (L) 03/06/2017 0907   K 3.7 01/09/2017 0913   CL 105 03/06/2017 0907   CL 107 06/29/2012 1131   CO2 22 03/06/2017 0907   CO2 21 (L) 01/09/2017 0913   BUN 10 03/06/2017 0907   BUN 9.8 01/09/2017 0913   CREATININE 1.66 (H) 03/06/2017 0907   CREATININE  1.5 (H) 01/09/2017 0913      Component Value Date/Time   CALCIUM 7.3 (L) 03/06/2017 0907   CALCIUM 7.5 (L) 01/09/2017 0913   ALKPHOS 57 03/06/2017 0907   ALKPHOS 55 01/09/2017 0913   AST 12 03/06/2017 0907   AST 12 01/09/2017 0913   ALT 11 03/06/2017 0907   ALT 10 01/09/2017 0913   BILITOT 0.7 03/06/2017 0907   BILITOT 0.44 01/09/2017 0913      All questions were answered. The patient knows to call the clinic with any problems, questions or concerns. No barriers to learning was detected.  I spent 30 minutes counseling the patient face to face. The total time spent in the appointment was 40 minutes and more than 50% was on counseling and review of test results  Calvin Lark, MD 03/06/2017 11:11 AM

## 2017-03-06 NOTE — Assessment & Plan Note (Signed)
He had recent acute on chronic renal failure secondary to poor oral intake, nausea and dehydration I recommend increase oral fluid intake as tolerated We will monitor his kidney function carefully.

## 2017-03-06 NOTE — Telephone Encounter (Signed)
Called infusion with below message. Verbalized understanding.

## 2017-03-06 NOTE — Progress Notes (Signed)
Per Dr. Alvy Bimler ok to treat with abnormal creatinine.

## 2017-03-06 NOTE — Assessment & Plan Note (Signed)
He tolerated the addition of Pomalyst poorly due to skin rash and pancytopenia Recent myeloma panel suggest disease progression Repeat panel today is pending I plan to see him back again in 11 days to review test results If repeat myeloma panel confirmed disease progression, I will stop current treatment and consider switching him over to Cytoxan, Kyprolis and dexamethasone

## 2017-03-06 NOTE — Patient Instructions (Addendum)
[facility] Discharge Instructions for Patients Receiving Chemotherapy  Today you received the following chemotherapy agents Darzalex.  To help prevent nausea and vomiting after your treatment, we encourage you to take your nausea medication as directed.  If you develop nausea and vomiting that is not controlled by your nausea medication, call the clinic.   BELOW ARE SYMPTOMS THAT SHOULD BE REPORTED IMMEDIATELY:  *FEVER GREATER THAN 100.5 F  *CHILLS WITH OR WITHOUT FEVER  NAUSEA AND VOMITING THAT IS NOT CONTROLLED WITH YOUR NAUSEA MEDICATION  *UNUSUAL SHORTNESS OF BREATH  *UNUSUAL BRUISING OR BLEEDING  TENDERNESS IN MOUTH AND THROAT WITH OR WITHOUT PRESENCE OF ULCERS  *URINARY PROBLEMS  *BOWEL PROBLEMS  UNUSUAL RASH Items with * indicate a potential emergency and should be followed up as soon as possible.  Feel free to call the clinic should you have any questions or concerns. The clinic phone number is [phone].  Please show the [facility] at check-in to the Emergency Department and triage nurse.  As we discussed, Per Dr. Alvy Bimler, eat a potassium rich diet and continue to take your potassium and Vitamin D.

## 2017-03-06 NOTE — Assessment & Plan Note (Signed)
He has multifactorial anemia, combination of anemia chronic kidney disease and recent blood loss/iron deficiency anemia His hemoglobin is stable.  Continue close observation

## 2017-03-06 NOTE — Telephone Encounter (Signed)
-----   Message from Heath Lark, MD sent at 03/06/2017 10:44 AM EST ----- Regarding: low potassium Let infusion nurse OK to proceed with abnormal labs. Also advise patient to eat potassium rich diet and make sure he takes calcium and vitamin D

## 2017-03-06 NOTE — Assessment & Plan Note (Signed)
He has significant dry skin dermatitis I recommend topical emollient cream If it does not improve, I will put him on some low-dose steroid It also could be exacerbated by Pomalyst I will observe only for now and reassess in his next visit

## 2017-03-06 NOTE — Patient Instructions (Signed)

## 2017-03-07 LAB — KAPPA/LAMBDA LIGHT CHAINS
KAPPA, LAMDA LIGHT CHAIN RATIO: 12.54 — AB (ref 0.26–1.65)
Kappa free light chain: 116.6 mg/L — ABNORMAL HIGH (ref 3.3–19.4)
LAMDA FREE LIGHT CHAINS: 9.3 mg/L (ref 5.7–26.3)

## 2017-03-10 LAB — MULTIPLE MYELOMA PANEL, SERUM
ALBUMIN SERPL ELPH-MCNC: 2.8 g/dL — AB (ref 2.9–4.4)
ALPHA 1: 0.3 g/dL (ref 0.0–0.4)
Albumin/Glob SerPl: 1 (ref 0.7–1.7)
Alpha2 Glob SerPl Elph-Mcnc: 0.8 g/dL (ref 0.4–1.0)
B-Globulin SerPl Elph-Mcnc: 0.8 g/dL (ref 0.7–1.3)
Gamma Glob SerPl Elph-Mcnc: 1.1 g/dL (ref 0.4–1.8)
Globulin, Total: 3 g/dL (ref 2.2–3.9)
IGA: 22 mg/dL — AB (ref 61–437)
IgG (Immunoglobin G), Serum: 1201 mg/dL (ref 700–1600)
IgM (Immunoglobulin M), Srm: 8 mg/dL — ABNORMAL LOW (ref 15–143)
M Protein SerPl Elph-Mcnc: 0.9 g/dL — ABNORMAL HIGH
Total Protein ELP: 5.8 g/dL — ABNORMAL LOW (ref 6.0–8.5)

## 2017-03-17 ENCOUNTER — Inpatient Hospital Stay: Payer: Medicare Other | Attending: Hematology and Oncology | Admitting: Hematology and Oncology

## 2017-03-17 ENCOUNTER — Encounter: Payer: Self-pay | Admitting: Hematology and Oncology

## 2017-03-17 VITALS — BP 129/57 | HR 70 | Temp 98.5°F | Resp 18 | Ht 71.0 in | Wt 154.2 lb

## 2017-03-17 DIAGNOSIS — N183 Chronic kidney disease, stage 3 unspecified: Secondary | ICD-10-CM

## 2017-03-17 DIAGNOSIS — Z7982 Long term (current) use of aspirin: Secondary | ICD-10-CM

## 2017-03-17 DIAGNOSIS — R21 Rash and other nonspecific skin eruption: Secondary | ICD-10-CM | POA: Insufficient documentation

## 2017-03-17 DIAGNOSIS — D631 Anemia in chronic kidney disease: Secondary | ICD-10-CM | POA: Diagnosis not present

## 2017-03-17 DIAGNOSIS — C9002 Multiple myeloma in relapse: Secondary | ICD-10-CM | POA: Diagnosis not present

## 2017-03-17 DIAGNOSIS — Z79899 Other long term (current) drug therapy: Secondary | ICD-10-CM

## 2017-03-17 DIAGNOSIS — Z5112 Encounter for antineoplastic immunotherapy: Secondary | ICD-10-CM | POA: Diagnosis present

## 2017-03-17 DIAGNOSIS — D500 Iron deficiency anemia secondary to blood loss (chronic): Secondary | ICD-10-CM

## 2017-03-17 DIAGNOSIS — L853 Xerosis cutis: Secondary | ICD-10-CM | POA: Insufficient documentation

## 2017-03-17 NOTE — Assessment & Plan Note (Signed)
His skin rash is improving.  He will continue topical emollient cream along with over-the-counter antihistamines as needed.

## 2017-03-17 NOTE — Assessment & Plan Note (Signed)
He had recent acute on chronic renal failure secondary to poor oral intake, nausea and dehydration I recommend increase oral fluid intake as tolerated We will monitor his kidney function carefully.

## 2017-03-17 NOTE — Assessment & Plan Note (Signed)
He has multifactorial anemia, combination of anemia chronic kidney disease and recent blood loss/iron deficiency anemia His hemoglobin and iron studies are stable.  Continue close observation I plan to recheck iron studies again in his next visit

## 2017-03-17 NOTE — Assessment & Plan Note (Signed)
He appears to be tolerating Pomalyst well His recent myeloma panel showed improved disease control We discussed the risk and benefit of continuing Pomalyst and he agreed to proceed For now, he will continue Pomalyst on day 1-21, rest 7 days and daratumumab once a month He will continue acyclovir for antimicrobial prophylaxis and aspirin for DVT prophylaxis His recent calcium level is low and he is not certain he is taking appropriate doses of calcium and vitamin D I recommend he bring all his pill bottles in his next visit so that I can go through all the his medications with him

## 2017-03-17 NOTE — Progress Notes (Signed)
[place] OFFICE PROGRESS NOTE  Patient Care Team: Lucianne Lei, MD as PCP - General (Family Medicine) Heath Lark, MD as Consulting Physician (Hematology and Oncology) Pleasant, [name], RN as [place] Management Beverely Pace, LCSW as Veterinary surgeon)  ASSESSMENT & PLAN:  Multiple myeloma in relapse [facility]) Calvin Mccormick His recent myeloma panel showed improved disease control We discussed the risk and benefit of continuing Pomalyst and Calvin agreed to proceed For now, Calvin will continue Pomalyst on day 1-21, rest 7 days and daratumumab once a month Calvin will continue acyclovir for antimicrobial prophylaxis and aspirin for DVT prophylaxis His recent calcium level is low and Calvin is not certain Calvin is taking appropriate doses of calcium and vitamin D I recommend Calvin bring all his pill bottles in his next visit so that I can go through all the his medications with him  Chronic renal insufficiency, stage III (moderate) Calvin had recent acute on chronic renal failure secondary to poor oral intake, nausea and dehydration I recommend increase oral fluid intake as tolerated We will monitor his kidney function carefully.  Anemia in chronic renal disease Calvin has multifactorial anemia, combination of anemia chronic kidney disease and recent blood loss/iron deficiency anemia His hemoglobin and iron studies are stable.  Continue close observation I plan to recheck iron studies again in his next visit  Dry skin dermatitis His skin rash is improving.  Calvin will continue topical emollient cream along with over-the-counter antihistamines as needed.   Orders Placed This Encounter  Procedures  . Ferritin    Standing Status:   Standing    Number of Occurrences:   2    Standing Expiration Date:   03/18/2018  . Iron and TIBC    Standing Status:   Standing    Number of Occurrences:   2    Standing Expiration Date:   03/18/2018     INTERVAL HISTORY: Please see below for problem oriented charting. Calvin returns with his wife for further follow-up His skin rashes are improving Calvin denies recent bone pain No recent infection The patient denies any recent signs or symptoms of bleeding such as spontaneous epistaxis, hematuria or hematochezia. His energy level is fair.  Calvin is tolerating chemotherapy Mccormick  SUMMARY OF ONCOLOGIC HISTORY:   Multiple myeloma in relapse ([place])   12/12/2010 Initial Diagnosis    Multiple myeloma      02/16/2014 Imaging    Skeletal survey showed diffuse osteopenia      03/02/2014 Bone Marrow Biopsy    Accession: [id number] BM biopsy showed only 5 % plasma cells. However, the biopsy was difficult and the bone was fragmented during the procedure. Cytogenetics and FISH study is normal      03/03/2014 Procedure    Calvin has port placement      03/10/2014 - 05/19/2014 Chemotherapy    Calvin received elotuzumab and revlimid      05/20/2014 - 02/25/2016 Chemotherapy    Treatment is switched to maintenance Revlimid only. Treatment is stopped due to progressive disease      11/15/2014 Adverse Reaction    Calvin has mild worsening anemia. Does of Revlimid reduced to 5 mg 21 days on, 7 days off      03/06/2016 -  Chemotherapy    Calvin received Daratumumab and Velcade. Velcade is stopped on 07/24/16      08/14/2016 Miscellaneous    Pomalyst is added along with Daratumumab  10/17/2016 Surgery    EGD was performed for coffee-ground emesis. Multiple linear superficial ulcers were noted in the esophagus from 25-35 cm from insertion. Severe esophagitis was noted from 30-35 cm from insertion. Multiple biopsies were obtained. A small hiatal hernia was noted. The gastric cavity contained coffee-ground fluid, after suctioning and lavage, no obvious gastric lesions/erosion/ulceration/erythema was noted. Biopsies were taken from the antrum to rule out H. Pylori. Duodenum bulb appeared unremarkable. A small  diverticulum was noted in second portion of the duodenum.        12/06/2016 Imaging    Ct scan abdomen No acute findings.  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  Stable small hiatal hernia.  Colonic diverticulosis, without radiographic evidence of diverticulitis.       REVIEW OF SYSTEMS:   Constitutional: Denies fevers, chills or abnormal weight loss Eyes: Denies blurriness of vision Ears, nose, mouth, throat, and face: Denies mucositis or sore throat Respiratory: Denies cough, dyspnea or wheezes Cardiovascular: Denies palpitation, chest discomfort or lower extremity swelling Gastrointestinal:  Denies nausea, heartburn or change in bowel habits Lymphatics: Denies new lymphadenopathy or easy bruising Neurological:Denies numbness, tingling or new weaknesses Behavioral/Psych: Mood is stable, no new changes  All other systems were reviewed with the patient and are negative.  I have reviewed the past medical history, past surgical history, social history and family history with the patient and they are unchanged from previous note.  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  MEDICATIONS:  Current Outpatient Medications  Medication Sig Dispense Refill  . acyclovir (ZOVIRAX) 400 MG tablet Take 1 tablet (400 mg total) by mouth daily. 60 tablet 11  . allopurinol (ZYLOPRIM) 100 MG tablet Take 100 mg by mouth 2 (two) times daily.     Marland Kitchen amLODipine (NORVASC) 10 MG tablet Take 10 mg by mouth daily.  1  . aspirin EC 81 MG tablet Take 81 mg by mouth daily.    Marland Kitchen atorvastatin (LIPITOR) 20 MG tablet Take 20 mg by mouth at bedtime.     . brimonidine (ALPHAGAN) 0.15 % ophthalmic solution Place 1 drop into the right eye 3 (three) times daily.    Marland Kitchen CARAFATE 1 GM/10ML suspension TAKE 10 ML BY MOUTH TWICE DAILY 420 mL 1  . cholecalciferol (VITAMIN D) 1000 UNITS tablet Take 1,000 Units by mouth daily.    . dorzolamide-timolol (COSOPT) 22.3-6.8  MG/ML ophthalmic solution Place 1 drop into both eyes 2 (two) times daily.     . fluorometholone (FML) 0.1 % ophthalmic suspension Place 1 drop into both eyes daily.    Marland Kitchen latanoprost (XALATAN) 0.005 % ophthalmic solution Place 1 drop into the right eye at bedtime.    . lidocaine-prilocaine (EMLA) cream Apply to affected area once 30 g 3  . loperamide (IMODIUM A-D) 2 MG tablet 1 po after each watery BM.  Maximum 6 per 24hrs. 30 tablet PRN  . ondansetron (ZOFRAN ODT) 4 MG disintegrating tablet Take 1 tablet (4 mg total) by mouth every 8 (eight) hours as needed for nausea or vomiting. 20 tablet 1  . ondansetron (ZOFRAN) 8 MG tablet Take 1 tablet (8 mg total) by mouth 2 (two) times daily as needed (Nausea or vomiting). 30 tablet 1  . pantoprazole (PROTONIX) 40 MG tablet Take 1 tablet (40 mg total) by mouth 2 (two) times daily before a meal. 60 tablet 9  . pomalidomide (POMALYST) 2 MG capsule Take 1 capsule (2 mg total) by mouth daily. Take with water on days  1-21. Repeat every 28 days. 21 capsule 0  . prochlorperazine (COMPAZINE) 10 MG tablet Take 1 tablet (10 mg total) by mouth every 6 (six) hours as needed (Nausea or vomiting). (Patient not taking: Reported on 12/06/2016) 30 tablet 1   No current facility-administered medications for this visit.     PHYSICAL EXAMINATION: ECOG PERFORMANCE STATUS: 1 - Symptomatic but completely ambulatory  Vitals:   03/17/17 1444  BP: (!) 129/57  Pulse: 70  Resp: 18  Temp: 98.5 F (36.9 C)  SpO2: 100%   Filed Weights   03/17/17 1444  Weight: 154 lb 3.2 oz (69.9 kg)    GENERAL:alert, no distress and comfortable SKIN: Calvin has persistent dry skin but the previous skin rash is improved dramatically EYES: normal, Conjunctiva are pink and non-injected, sclera clear OROPHARYNX:no exudate, no erythema and lips, buccal mucosa, and tongue normal  NECK: supple, thyroid normal size, non-tender, without nodularity LYMPH:  no palpable lymphadenopathy in the  cervical, axillary or inguinal LUNGS: clear to auscultation and percussion with normal breathing effort HEART: regular rate & rhythm and no murmurs and no lower extremity edema ABDOMEN:abdomen soft, non-tender and normal bowel sounds Musculoskeletal:no cyanosis of digits and no clubbing  NEURO: alert & oriented x 3 with fluent speech, no focal motor/sensory deficits  LABORATORY DATA:  I have reviewed the data as listed    Component Value Date/Time   NA 138 03/06/2017 0907   NA 140 01/09/2017 0913   K 3.1 (L) 03/06/2017 0907   K 3.7 01/09/2017 0913   CL 105 03/06/2017 0907   CL 107 06/29/2012 1131   CO2 22 03/06/2017 0907   CO2 21 (L) 01/09/2017 0913   GLUCOSE 136 03/06/2017 0907   GLUCOSE 80 01/09/2017 0913   GLUCOSE 143 (H) 06/29/2012 1131   BUN 10 03/06/2017 0907   BUN 9.8 01/09/2017 0913   CREATININE 1.66 (H) 03/06/2017 0907   CREATININE 1.5 (H) 01/09/2017 0913   CALCIUM 7.3 (L) 03/06/2017 0907   CALCIUM 7.5 (L) 01/09/2017 0913   PROT 6.4 03/06/2017 0907   PROT 5.7 (L) 01/09/2017 0913   ALBUMIN 2.7 (L) 03/06/2017 0907   ALBUMIN 2.9 (L) 01/09/2017 0913   AST 12 03/06/2017 0907   AST 12 01/09/2017 0913   ALT 11 03/06/2017 0907   ALT 10 01/09/2017 0913   ALKPHOS 57 03/06/2017 0907   ALKPHOS 55 01/09/2017 0913   BILITOT 0.7 03/06/2017 0907   BILITOT 0.44 01/09/2017 0913   GFRNONAA 39 (L) 03/06/2017 0907   GFRAA 45 (L) 03/06/2017 0907    No results found for: SPEP, UPEP  Lab Results  Component Value Date   WBC 5.5 03/06/2017   NEUTROABS 2.4 03/06/2017   HGB 8.8 (L) 03/06/2017   HCT 25.8 (L) 03/06/2017   MCV 95.9 03/06/2017   PLT 325 03/06/2017      Chemistry      Component Value Date/Time   NA 138 03/06/2017 0907   NA 140 01/09/2017 0913   K 3.1 (L) 03/06/2017 0907   K 3.7 01/09/2017 0913   CL 105 03/06/2017 0907   CL 107 06/29/2012 1131   CO2 22 03/06/2017 0907   CO2 21 (L) 01/09/2017 0913   BUN 10 03/06/2017 0907   BUN 9.8 01/09/2017 0913    CREATININE 1.66 (H) 03/06/2017 0907   CREATININE 1.5 (H) 01/09/2017 0913      Component Value Date/Time   CALCIUM 7.3 (L) 03/06/2017 0907   CALCIUM 7.5 (L) 01/09/2017 0913   ALKPHOS 57  03/06/2017 0907   ALKPHOS 55 01/09/2017 0913   AST 12 03/06/2017 0907   AST 12 01/09/2017 0913   ALT 11 03/06/2017 0907   ALT 10 01/09/2017 0913   BILITOT 0.7 03/06/2017 0907   BILITOT 0.44 01/09/2017 0913      All questions were answered. The patient knows to call the clinic with any problems, questions or concerns. No barriers to learning was detected.  I spent 15 minutes counseling the patient face to face. The total time spent in the appointment was 20 minutes and more than 50% was on counseling and review of test results  Heath Lark, MD 03/17/2017 5:02 PM

## 2017-03-21 ENCOUNTER — Other Ambulatory Visit: Payer: Self-pay

## 2017-03-21 DIAGNOSIS — C9002 Multiple myeloma in relapse: Secondary | ICD-10-CM

## 2017-03-21 MED ORDER — POMALIDOMIDE 2 MG PO CAPS: 2.0000 mg | ORAL_CAPSULE | Freq: Every day | ORAL | 0 refills | Status: DC

## 2017-04-03 ENCOUNTER — Inpatient Hospital Stay: Payer: Medicare Other

## 2017-04-03 ENCOUNTER — Encounter: Payer: Self-pay | Admitting: Hematology and Oncology

## 2017-04-03 ENCOUNTER — Inpatient Hospital Stay (HOSPITAL_BASED_OUTPATIENT_CLINIC_OR_DEPARTMENT_OTHER): Payer: Medicare Other | Admitting: Hematology and Oncology

## 2017-04-03 ENCOUNTER — Inpatient Hospital Stay: Payer: Medicare Other | Attending: Hematology and Oncology

## 2017-04-03 ENCOUNTER — Telehealth: Payer: Self-pay | Admitting: Hematology and Oncology

## 2017-04-03 VITALS — BP 129/63 | HR 63 | Temp 97.5°F | Resp 18 | Ht 71.0 in | Wt 149.7 lb

## 2017-04-03 VITALS — BP 110/55 | HR 61 | Temp 97.6°F | Resp 16

## 2017-04-03 DIAGNOSIS — C9002 Multiple myeloma in relapse: Secondary | ICD-10-CM

## 2017-04-03 DIAGNOSIS — Z7982 Long term (current) use of aspirin: Secondary | ICD-10-CM | POA: Insufficient documentation

## 2017-04-03 DIAGNOSIS — Z95828 Presence of other vascular implants and grafts: Secondary | ICD-10-CM

## 2017-04-03 DIAGNOSIS — R530 Neoplastic (malignant) related fatigue: Secondary | ICD-10-CM

## 2017-04-03 DIAGNOSIS — D631 Anemia in chronic kidney disease: Secondary | ICD-10-CM

## 2017-04-03 DIAGNOSIS — E876 Hypokalemia: Secondary | ICD-10-CM

## 2017-04-03 DIAGNOSIS — R197 Diarrhea, unspecified: Secondary | ICD-10-CM

## 2017-04-03 DIAGNOSIS — E440 Moderate protein-calorie malnutrition: Secondary | ICD-10-CM | POA: Diagnosis not present

## 2017-04-03 DIAGNOSIS — Z79899 Other long term (current) drug therapy: Secondary | ICD-10-CM | POA: Diagnosis not present

## 2017-04-03 DIAGNOSIS — N183 Chronic kidney disease, stage 3 unspecified: Secondary | ICD-10-CM

## 2017-04-03 DIAGNOSIS — Z5112 Encounter for antineoplastic immunotherapy: Secondary | ICD-10-CM | POA: Insufficient documentation

## 2017-04-03 DIAGNOSIS — L853 Xerosis cutis: Secondary | ICD-10-CM | POA: Diagnosis not present

## 2017-04-03 DIAGNOSIS — R21 Rash and other nonspecific skin eruption: Secondary | ICD-10-CM | POA: Diagnosis not present

## 2017-04-03 DIAGNOSIS — C9001 Multiple myeloma in remission: Secondary | ICD-10-CM

## 2017-04-03 DIAGNOSIS — D500 Iron deficiency anemia secondary to blood loss (chronic): Secondary | ICD-10-CM

## 2017-04-03 LAB — CBC WITH DIFFERENTIAL/PLATELET
BASOS ABS: 0.1 10*3/uL (ref 0.0–0.1)
BASOS PCT: 3 %
EOS ABS: 0.3 10*3/uL (ref 0.0–0.5)
EOS PCT: 6 %
HCT: 25.4 % — ABNORMAL LOW (ref 38.4–49.9)
HEMOGLOBIN: 8.6 g/dL — AB (ref 13.0–17.1)
Lymphocytes Relative: 43 %
Lymphs Abs: 2.5 10*3/uL (ref 0.9–3.3)
MCH: 33.3 pg (ref 27.2–33.4)
MCHC: 33.9 g/dL (ref 32.0–36.0)
MCV: 98.3 fL — ABNORMAL HIGH (ref 79.3–98.0)
MONOS PCT: 6 %
Monocytes Absolute: 0.3 10*3/uL (ref 0.1–0.9)
Neutro Abs: 2.4 10*3/uL (ref 1.5–6.5)
Neutrophils Relative %: 42 %
PLATELETS: 442 10*3/uL — AB (ref 140–400)
RBC: 2.58 MIL/uL — ABNORMAL LOW (ref 4.20–5.82)
RDW: 16.8 % — ABNORMAL HIGH (ref 11.0–14.6)
WBC: 5.7 10*3/uL (ref 4.0–10.3)

## 2017-04-03 LAB — IRON AND TIBC
Iron: 51 ug/dL (ref 42–163)
SATURATION RATIOS: 35 % — AB (ref 42–163)
TIBC: 146 ug/dL — ABNORMAL LOW (ref 202–409)
UIBC: 95 ug/dL

## 2017-04-03 LAB — FERRITIN: Ferritin: 669 ng/mL — ABNORMAL HIGH (ref 22–316)

## 2017-04-03 MED ORDER — SODIUM CHLORIDE 0.9% FLUSH
10.0000 mL | INTRAVENOUS | Status: DC | PRN
  Administered 2017-04-03: 10 mL
  Filled 2017-04-03: qty 10

## 2017-04-03 MED ORDER — SODIUM CHLORIDE 0.9 % IV SOLN
1100.0000 mg | Freq: Once | INTRAVENOUS | Status: AC
  Administered 2017-04-03: 1100 mg via INTRAVENOUS
  Filled 2017-04-03: qty 40

## 2017-04-03 MED ORDER — SODIUM CHLORIDE 0.9 % IV SOLN
Freq: Once | INTRAVENOUS | Status: AC
  Administered 2017-04-03: 10:00:00 via INTRAVENOUS

## 2017-04-03 MED ORDER — METHYLPREDNISOLONE SODIUM SUCC 40 MG IJ SOLR
40.0000 mg | Freq: Once | INTRAMUSCULAR | Status: AC
  Administered 2017-04-03: 40 mg via INTRAVENOUS

## 2017-04-03 MED ORDER — ACYCLOVIR 400 MG PO TABS: 400.0000 mg | ORAL_TABLET | Freq: Every day | ORAL | 11 refills | Status: DC

## 2017-04-03 MED ORDER — PROCHLORPERAZINE MALEATE 10 MG PO TABS
ORAL_TABLET | ORAL | Status: AC
Start: 2017-04-03 — End: 2017-04-03
  Filled 2017-04-03: qty 1

## 2017-04-03 MED ORDER — POTASSIUM CHLORIDE 10 MEQ/100ML IV SOLN
10.0000 meq | INTRAVENOUS | Status: DC
  Filled 2017-04-03: qty 100

## 2017-04-03 MED ORDER — POTASSIUM CHLORIDE 10 MEQ/100ML IV SOLN
10.0000 meq | INTRAVENOUS | Status: AC
  Administered 2017-04-03 (×2): 10 meq via INTRAVENOUS
  Filled 2017-04-03: qty 100

## 2017-04-03 MED ORDER — ACETAMINOPHEN 325 MG PO TABS
650.0000 mg | ORAL_TABLET | Freq: Once | ORAL | Status: AC
  Administered 2017-04-03: 650 mg via ORAL

## 2017-04-03 MED ORDER — METHYLPREDNISOLONE SODIUM SUCC 40 MG IJ SOLR
INTRAMUSCULAR | Status: AC
  Filled 2017-04-03: qty 1

## 2017-04-03 MED ORDER — ACETAMINOPHEN 325 MG PO TABS
ORAL_TABLET | ORAL | Status: AC
  Filled 2017-04-03: qty 2

## 2017-04-03 MED ORDER — HEPARIN SOD (PORK) LOCK FLUSH 100 UNIT/ML IV SOLN
500.0000 [IU] | Freq: Once | INTRAVENOUS | Status: AC | PRN
  Administered 2017-04-03: 500 [IU]
  Filled 2017-04-03: qty 5

## 2017-04-03 MED ORDER — PROCHLORPERAZINE MALEATE 10 MG PO TABS
10.0000 mg | ORAL_TABLET | Freq: Once | ORAL | Status: AC
  Administered 2017-04-03: 10 mg via ORAL

## 2017-04-03 MED ORDER — DIPHENHYDRAMINE HCL 25 MG PO CAPS
50.0000 mg | ORAL_CAPSULE | Freq: Once | ORAL | Status: AC
  Administered 2017-04-03: 50 mg via ORAL

## 2017-04-03 MED ORDER — SODIUM CHLORIDE 0.9% FLUSH
10.0000 mL | Freq: Once | INTRAVENOUS | Status: AC
  Administered 2017-04-03: 10 mL
  Filled 2017-04-03: qty 10

## 2017-04-03 MED ORDER — POTASSIUM CHLORIDE CRYS ER 20 MEQ PO TBCR: 20.0000 meq | EXTENDED_RELEASE_TABLET | Freq: Two times a day (BID) | ORAL | 0 refills | Status: DC

## 2017-04-03 MED ORDER — DIPHENHYDRAMINE HCL 25 MG PO CAPS
ORAL_CAPSULE | ORAL | Status: AC
Start: 2017-04-03 — End: 2017-04-03
  Filled 2017-04-03: qty 2

## 2017-04-03 NOTE — Assessment & Plan Note (Signed)
He had recent acute on chronic renal failure secondary to poor oral intake, diarrhea and dehydration I recommend increase oral fluid intake as tolerated We will monitor his kidney function carefully.

## 2017-04-03 NOTE — Progress Notes (Signed)
[place] OFFICE PROGRESS NOTE  Patient Care Team: Lucianne Lei, MD as PCP - General (Family Medicine) Heath Lark, MD as Consulting Physician (Hematology and Oncology) Pleasant, [name], RN as [place] Management Beverely Pace, LCSW as Veterinary surgeon)  ASSESSMENT & PLAN:  Multiple myeloma in relapse [facility]) He appears to be tolerating Pomalyst with expected side-effects His recent myeloma panel showed improved disease control We discussed the risk and benefit of continuing Pomalyst and he agreed to proceed For now, he will continue Pomalyst on day 1-21, rest 7 days and daratumumab once a month He will continue acyclovir for antimicrobial prophylaxis and aspirin for DVT prophylaxis He will continue calcium with vitamin D supplement  Anemia in chronic renal disease He has multifactorial anemia, combination of anemia chronic kidney disease and recent blood loss/iron deficiency anemia His hemoglobin and iron studies are stable.  Continue close observation I plan to recheck iron studies again in his next visit  Chronic renal insufficiency, stage III (moderate) He had recent acute on chronic renal failure secondary to poor oral intake, diarrhea and dehydration I recommend increase oral fluid intake as tolerated We will monitor his kidney function carefully.  Diarrhea He continues to have chronic diarrhea, could be due to treatment I recommend Imodium on a scheduled basis  Hypokalemia Hypokalemia likely due to GI loss I will give him IV replacement therapy and oral replacement therapy We discussed potassium rich diet I will check magnesium level in his next visit  Protein calorie malnutrition ([place]) He has significant weight loss since the last time I saw him We discussed increase oral intake as tolerated   Orders Placed This Encounter  Procedures  . Magnesium    Standing Status:   Standing    Number of Occurrences:    9    Standing Expiration Date:   04/04/2018    INTERVAL HISTORY: Please see below for problem oriented charting. He returns with his wife for further follow-up and chemotherapy He complained of weakness He has been having frequent diarrhea but claims he is not dehydrated He has lost some weight but denies loss of appetite The patient denies any recent signs or symptoms of bleeding such as spontaneous epistaxis, hematuria or hematochezia. He denies bone pain Denies recent infection  SUMMARY OF ONCOLOGIC HISTORY:   Multiple myeloma in relapse ([place])   12/12/2010 Initial Diagnosis    Multiple myeloma      02/16/2014 Imaging    Skeletal survey showed diffuse osteopenia      03/02/2014 Bone Marrow Biopsy    Accession: [id number] BM biopsy showed only 5 % plasma cells. However, the biopsy was difficult and the bone was fragmented during the procedure. Cytogenetics and FISH study is normal      03/03/2014 Procedure    he has port placement      03/10/2014 - 05/19/2014 Chemotherapy    he received elotuzumab and revlimid      05/20/2014 - 02/25/2016 Chemotherapy    Treatment is switched to maintenance Revlimid only. Treatment is stopped due to progressive disease      11/15/2014 Adverse Reaction    He has mild worsening anemia. Does of Revlimid reduced to 5 mg 21 days on, 7 days off      03/06/2016 -  Chemotherapy    He received Daratumumab and Velcade. Velcade is stopped on 07/24/16      08/14/2016 Miscellaneous    Pomalyst is added along with Daratumumab  10/17/2016 Surgery    EGD was performed for coffee-ground emesis. Multiple linear superficial ulcers were noted in the esophagus from 25-35 cm from insertion. Severe esophagitis was noted from 30-35 cm from insertion. Multiple biopsies were obtained. A small hiatal hernia was noted. The gastric cavity contained coffee-ground fluid, after suctioning and lavage, no obvious gastric lesions/erosion/ulceration/erythema was  noted. Biopsies were taken from the antrum to rule out H. Pylori. Duodenum bulb appeared unremarkable. A small diverticulum was noted in second portion of the duodenum.        12/06/2016 Imaging    Ct scan abdomen No acute findings.  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  Stable small hiatal hernia.  Colonic diverticulosis, without radiographic evidence of diverticulitis.       REVIEW OF SYSTEMS:   Constitutional: Denies fevers, chills or abnormal weight loss Eyes: Denies blurriness of vision Ears, nose, mouth, throat, and face: Denies mucositis or sore throat Respiratory: Denies cough, dyspnea or wheezes Cardiovascular: Denies palpitation, chest discomfort or lower extremity swelling Gastrointestinal:  Denies nausea, heartburn or change in bowel habits Skin: Denies abnormal skin rashes Lymphatics: Denies new lymphadenopathy or easy bruising Neurological:Denies numbness, tingling or new weaknesses Behavioral/Psych: Mood is stable, no new changes  All other systems were reviewed with the patient and are negative.  I have reviewed the past medical history, past surgical history, social history and family history with the patient and they are unchanged from previous note.  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  MEDICATIONS:  Current Outpatient Medications  Medication Sig Dispense Refill  . acyclovir (ZOVIRAX) 400 MG tablet Take 1 tablet (400 mg total) by mouth daily. 60 tablet 11  . allopurinol (ZYLOPRIM) 100 MG tablet Take 100 mg by mouth 2 (two) times daily.     Marland Kitchen amLODipine (NORVASC) 10 MG tablet Take 10 mg by mouth daily.  1  . aspirin EC 81 MG tablet Take 81 mg by mouth daily.    Marland Kitchen atorvastatin (LIPITOR) 20 MG tablet Take 20 mg by mouth at bedtime.     . brimonidine (ALPHAGAN) 0.15 % ophthalmic solution Place 1 drop into the right eye 3 (three) times daily.    . cholecalciferol (VITAMIN D) 1000 UNITS tablet Take  1,000 Units by mouth daily.    . dorzolamide-timolol (COSOPT) 22.3-6.8 MG/ML ophthalmic solution Place 1 drop into both eyes 2 (two) times daily.     . fluorometholone (FML) 0.1 % ophthalmic suspension Place 1 drop into both eyes daily.    Marland Kitchen latanoprost (XALATAN) 0.005 % ophthalmic solution Place 1 drop into the right eye at bedtime.    . lidocaine-prilocaine (EMLA) cream Apply to affected area once 30 g 3  . loperamide (IMODIUM A-D) 2 MG tablet 1 po after each watery BM.  Maximum 6 per 24hrs. 30 tablet PRN  . ondansetron (ZOFRAN) 8 MG tablet Take 1 tablet (8 mg total) by mouth 2 (two) times daily as needed (Nausea or vomiting). 30 tablet 1  . pantoprazole (PROTONIX) 40 MG tablet Take 1 tablet (40 mg total) by mouth 2 (two) times daily before a meal. 60 tablet 9  . pomalidomide (POMALYST) 2 MG capsule Take 1 capsule (2 mg total) by mouth daily. Take with water on days 1-21. Repeat every 28 days. 21 capsule 0  . potassium chloride SA (K-DUR,KLOR-CON) 20 MEQ tablet Take 1 tablet (20 mEq total) by mouth 2 (two) times daily. 14 tablet 0   No current facility-administered medications for this visit.  Facility-Administered Medications Ordered in Other Visits  Medication Dose Route Frequency Provider Last Rate Last Dose  . daratumumab (DARZALEX) 1,100 mg in sodium chloride 0.9 % 445 mL chemo infusion  1,100 mg Intravenous Once Jereline Ticer, MD      . heparin lock flush 100 unit/mL  500 Units Intracatheter Once PRN Alvy Bimler, Kruz Chiu, MD      . potassium chloride 10 mEq in 100 mL IVPB  10 mEq Intravenous Q1 Hr x 2 Alvy Bimler, Katianna Mcclenney, MD   10 mEq at 04/03/17 1025  . sodium chloride flush (NS) 0.9 % injection 10 mL  10 mL Intracatheter PRN Alvy Bimler, Thos Matsumoto, MD        PHYSICAL EXAMINATION: ECOG PERFORMANCE STATUS: 2 - Symptomatic, <50% confined to bed  Vitals:   04/03/17 0938  BP: 129/63  Pulse: 63  Resp: 18  Temp: (!) 97.5 F (36.4 C)  SpO2: 100%   Filed Weights   04/03/17 0938  Weight: 149 lb 11.2 oz (67.9  kg)    GENERAL:alert, no distress and comfortable.  He looks thin and cachectic SKIN: skin color, texture, turgor are normal, no rashes or significant lesions EYES: normal, Conjunctiva are pink and non-injected, sclera clear OROPHARYNX:no exudate, no erythema and lips, buccal mucosa, and tongue normal  NECK: supple, thyroid normal size, non-tender, without nodularity LYMPH:  no palpable lymphadenopathy in the cervical, axillary or inguinal LUNGS: clear to auscultation and percussion with normal breathing effort HEART: regular rate & rhythm and no murmurs and no lower extremity edema ABDOMEN:abdomen soft, non-tender and normal bowel sounds Musculoskeletal:no cyanosis of digits and no clubbing  NEURO: alert & oriented x 3 with fluent speech, no focal motor/sensory deficits  LABORATORY DATA:  I have reviewed the data as listed    Component Value Date/Time   NA 140 04/03/2017 0811   NA 140 01/09/2017 0913   K 2.7 (LL) 04/03/2017 0811   K 3.7 01/09/2017 0913   CL 106 04/03/2017 0811   CL 107 06/29/2012 1131   CO2 25 04/03/2017 0811   CO2 21 (L) 01/09/2017 0913   GLUCOSE 105 04/03/2017 0811   GLUCOSE 80 01/09/2017 0913   GLUCOSE 143 (H) 06/29/2012 1131   BUN 8 04/03/2017 0811   BUN 9.8 01/09/2017 0913   CREATININE 1.54 (H) 04/03/2017 0811   CREATININE 1.5 (H) 01/09/2017 0913   CALCIUM 7.4 (L) 04/03/2017 0811   CALCIUM 7.5 (L) 01/09/2017 0913   PROT 6.0 (L) 04/03/2017 0811   PROT 5.7 (L) 01/09/2017 0913   ALBUMIN 2.7 (L) 04/03/2017 0811   ALBUMIN 2.9 (L) 01/09/2017 0913   AST 14 04/03/2017 0811   AST 12 01/09/2017 0913   ALT 19 04/03/2017 0811   ALT 10 01/09/2017 0913   ALKPHOS 62 04/03/2017 0811   ALKPHOS 55 01/09/2017 0913   BILITOT 0.7 04/03/2017 0811   BILITOT 0.44 01/09/2017 0913   GFRNONAA 43 (L) 04/03/2017 0811   GFRAA 50 (L) 04/03/2017 0811    No results found for: SPEP, UPEP  Lab Results  Component Value Date   WBC 5.7 04/03/2017   NEUTROABS 2.4 04/03/2017    HGB 8.6 (L) 04/03/2017   HCT 25.4 (L) 04/03/2017   MCV 98.3 (H) 04/03/2017   PLT 442 (H) 04/03/2017      Chemistry      Component Value Date/Time   NA 140 04/03/2017 0811   NA 140 01/09/2017 0913   K 2.7 (LL) 04/03/2017 0811   K 3.7 01/09/2017 0913   CL 106 04/03/2017 0300  CL 107 06/29/2012 1131   CO2 25 04/03/2017 0811   CO2 21 (L) 01/09/2017 0913   BUN 8 04/03/2017 0811   BUN 9.8 01/09/2017 0913   CREATININE 1.54 (H) 04/03/2017 0811   CREATININE 1.5 (H) 01/09/2017 0913      Component Value Date/Time   CALCIUM 7.4 (L) 04/03/2017 0811   CALCIUM 7.5 (L) 01/09/2017 0913   ALKPHOS 62 04/03/2017 0811   ALKPHOS 55 01/09/2017 0913   AST 14 04/03/2017 0811   AST 12 01/09/2017 0913   ALT 19 04/03/2017 0811   ALT 10 01/09/2017 0913   BILITOT 0.7 04/03/2017 0811   BILITOT 0.44 01/09/2017 0913       All questions were answered. The patient knows to call the clinic with any problems, questions or concerns. No barriers to learning was detected.  I spent 25 minutes counseling the patient face to face. The total time spent in the appointment was 40 minutes and more than 50% was on counseling and review of test results  Heath Lark, MD 04/03/2017 11:19 AM

## 2017-04-03 NOTE — Assessment & Plan Note (Signed)
Hypokalemia likely due to GI loss I will give him IV replacement therapy and oral replacement therapy We discussed potassium rich diet I will check magnesium level in his next visit

## 2017-04-03 NOTE — Telephone Encounter (Signed)
Gave avs and calendar ° °

## 2017-04-03 NOTE — Assessment & Plan Note (Signed)
He has multifactorial anemia, combination of anemia chronic kidney disease and recent blood loss/iron deficiency anemia His hemoglobin and iron studies are stable.  Continue close observation I plan to recheck iron studies again in his next visit

## 2017-04-03 NOTE — Assessment & Plan Note (Signed)
He appears to be tolerating Pomalyst with expected side-effects His recent myeloma panel showed improved disease control We discussed the risk and benefit of continuing Pomalyst and he agreed to proceed For now, he will continue Pomalyst on day 1-21, rest 7 days and daratumumab once a month He will continue acyclovir for antimicrobial prophylaxis and aspirin for DVT prophylaxis He will continue calcium with vitamin D supplement

## 2017-04-03 NOTE — Assessment & Plan Note (Signed)
He has significant weight loss since the last time I saw him We discussed increase oral intake as tolerated

## 2017-04-03 NOTE — Assessment & Plan Note (Signed)
He continues to have chronic diarrhea, could be due to treatment I recommend Imodium on a scheduled basis

## 2017-04-03 NOTE — Patient Instructions (Signed)
[facility] Discharge Instructions for Patients Receiving Chemotherapy  Today you received the following chemotherapy agents: Darzalex  To help prevent nausea and vomiting after your treatment, we encourage you to take your nausea medication as directed.    If you develop nausea and vomiting that is not controlled by your nausea medication, call the clinic.   BELOW ARE SYMPTOMS THAT SHOULD BE REPORTED IMMEDIATELY:  *FEVER GREATER THAN 100.5 F  *CHILLS WITH OR WITHOUT FEVER  NAUSEA AND VOMITING THAT IS NOT CONTROLLED WITH YOUR NAUSEA MEDICATION  *UNUSUAL SHORTNESS OF BREATH  *UNUSUAL BRUISING OR BLEEDING  TENDERNESS IN MOUTH AND THROAT WITH OR WITHOUT PRESENCE OF ULCERS  *URINARY PROBLEMS  *BOWEL PROBLEMS  UNUSUAL RASH Items with * indicate a potential emergency and should be followed up as soon as possible.  Feel free to call the clinic should you have any questions or concerns. The clinic phone number is [phone].  Please show the CHEMO ALERT CARD at check-in to the Emergency Department and triage nurse.   

## 2017-04-04 DIAGNOSIS — H04123 Dry eye syndrome of bilateral lacrimal glands: Secondary | ICD-10-CM | POA: Diagnosis not present

## 2017-04-04 DIAGNOSIS — H26492 Other secondary cataract, left eye: Secondary | ICD-10-CM | POA: Diagnosis not present

## 2017-04-04 DIAGNOSIS — H5213 Myopia, bilateral: Secondary | ICD-10-CM | POA: Diagnosis not present

## 2017-04-04 DIAGNOSIS — Z961 Presence of intraocular lens: Secondary | ICD-10-CM | POA: Diagnosis not present

## 2017-04-04 LAB — COMPREHENSIVE METABOLIC PANEL
ALBUMIN: 2.7 g/dL — AB (ref 3.5–5.0)
ALK PHOS: 62 U/L (ref 40–150)
ALT: 19 U/L (ref 0–55)
AST: 14 U/L (ref 5–34)
Anion gap: 9 (ref 3–11)
BUN: 8 mg/dL (ref 7–26)
CALCIUM: 7.4 mg/dL — AB (ref 8.4–10.4)
CHLORIDE: 106 mmol/L (ref 98–109)
CO2: 25 mmol/L (ref 22–29)
Creatinine, Ser: 1.54 mg/dL — ABNORMAL HIGH (ref 0.70–1.30)
GFR calc Af Amer: 50 mL/min — ABNORMAL LOW (ref >60)
GFR calc non Af Amer: 43 mL/min — ABNORMAL LOW (ref >60)
GLUCOSE: 105 mg/dL (ref 70–140)
Potassium: 2.7 mmol/L — CL (ref 3.5–5.1)
SODIUM: 140 mmol/L (ref 136–145)
Total Bilirubin: 0.7 mg/dL (ref 0.2–1.2)
Total Protein: 6 g/dL — ABNORMAL LOW (ref 6.4–8.3)

## 2017-04-04 LAB — KAPPA/LAMBDA LIGHT CHAINS
KAPPA, LAMDA LIGHT CHAIN RATIO: 13.89 — AB (ref 0.26–1.65)
Kappa free light chain: 136.1 mg/L — ABNORMAL HIGH (ref 3.3–19.4)
Lambda free light chains: 9.8 mg/L (ref 5.7–26.3)

## 2017-04-07 LAB — MULTIPLE MYELOMA PANEL, SERUM
ALPHA 1: 0.3 g/dL (ref 0.0–0.4)
Albumin SerPl Elph-Mcnc: 2.8 g/dL — ABNORMAL LOW (ref 2.9–4.4)
Albumin/Glob SerPl: 1 (ref 0.7–1.7)
Alpha2 Glob SerPl Elph-Mcnc: 0.7 g/dL (ref 0.4–1.0)
B-Globulin SerPl Elph-Mcnc: 0.8 g/dL (ref 0.7–1.3)
GAMMA GLOB SERPL ELPH-MCNC: 1.3 g/dL (ref 0.4–1.8)
GLOBULIN, TOTAL: 3 g/dL (ref 2.2–3.9)
IGA: 22 mg/dL — AB (ref 61–437)
IgG (Immunoglobin G), Serum: 1380 mg/dL (ref 700–1600)
IgM (Immunoglobulin M), Srm: 8 mg/dL — ABNORMAL LOW (ref 15–143)
M Protein SerPl Elph-Mcnc: 1.1 g/dL — ABNORMAL HIGH
TOTAL PROTEIN ELP: 5.8 g/dL — AB (ref 6.0–8.5)

## 2017-04-14 ENCOUNTER — Other Ambulatory Visit: Payer: Self-pay | Admitting: *Deleted

## 2017-04-14 DIAGNOSIS — C9002 Multiple myeloma in relapse: Secondary | ICD-10-CM

## 2017-04-14 MED ORDER — POMALIDOMIDE 2 MG PO CAPS: 2.0000 mg | ORAL_CAPSULE | Freq: Every day | ORAL | 0 refills | Status: DC

## 2017-04-16 DIAGNOSIS — H4051X2 Glaucoma secondary to other eye disorders, right eye, moderate stage: Secondary | ICD-10-CM | POA: Diagnosis not present

## 2017-04-16 DIAGNOSIS — Z961 Presence of intraocular lens: Secondary | ICD-10-CM | POA: Diagnosis not present

## 2017-05-05 ENCOUNTER — Telehealth: Payer: Self-pay | Admitting: Hematology and Oncology

## 2017-05-05 ENCOUNTER — Encounter: Payer: Self-pay | Admitting: Hematology and Oncology

## 2017-05-05 ENCOUNTER — Inpatient Hospital Stay (HOSPITAL_BASED_OUTPATIENT_CLINIC_OR_DEPARTMENT_OTHER): Payer: Medicare Other | Admitting: Hematology and Oncology

## 2017-05-05 ENCOUNTER — Inpatient Hospital Stay: Payer: Medicare Other

## 2017-05-05 ENCOUNTER — Other Ambulatory Visit: Payer: Self-pay | Admitting: *Deleted

## 2017-05-05 ENCOUNTER — Inpatient Hospital Stay: Payer: Medicare Other | Attending: Hematology and Oncology

## 2017-05-05 VITALS — BP 146/63 | HR 52 | Temp 98.2°F | Resp 16

## 2017-05-05 VITALS — BP 167/65 | HR 60 | Temp 97.4°F | Resp 18 | Ht 71.0 in | Wt 150.3 lb

## 2017-05-05 DIAGNOSIS — Z7982 Long term (current) use of aspirin: Secondary | ICD-10-CM | POA: Insufficient documentation

## 2017-05-05 DIAGNOSIS — D631 Anemia in chronic kidney disease: Secondary | ICD-10-CM

## 2017-05-05 DIAGNOSIS — N183 Chronic kidney disease, stage 3 unspecified: Secondary | ICD-10-CM

## 2017-05-05 DIAGNOSIS — Z95828 Presence of other vascular implants and grafts: Secondary | ICD-10-CM

## 2017-05-05 DIAGNOSIS — Z79899 Other long term (current) drug therapy: Secondary | ICD-10-CM

## 2017-05-05 DIAGNOSIS — E860 Dehydration: Secondary | ICD-10-CM

## 2017-05-05 DIAGNOSIS — Z5112 Encounter for antineoplastic immunotherapy: Secondary | ICD-10-CM | POA: Insufficient documentation

## 2017-05-05 DIAGNOSIS — C9002 Multiple myeloma in relapse: Secondary | ICD-10-CM

## 2017-05-05 DIAGNOSIS — E876 Hypokalemia: Secondary | ICD-10-CM

## 2017-05-05 DIAGNOSIS — D500 Iron deficiency anemia secondary to blood loss (chronic): Secondary | ICD-10-CM

## 2017-05-05 LAB — COMPREHENSIVE METABOLIC PANEL
ALT: 14 U/L (ref 0–55)
ANION GAP: 8 (ref 3–11)
AST: 15 U/L (ref 5–34)
Albumin: 3 g/dL — ABNORMAL LOW (ref 3.5–5.0)
Alkaline Phosphatase: 61 U/L (ref 40–150)
BUN: 9 mg/dL (ref 7–26)
CHLORIDE: 109 mmol/L (ref 98–109)
CO2: 22 mmol/L (ref 22–29)
CREATININE: 1.32 mg/dL — AB (ref 0.70–1.30)
Calcium: 7.6 mg/dL — ABNORMAL LOW (ref 8.4–10.4)
GFR calc non Af Amer: 51 mL/min — ABNORMAL LOW (ref >60)
GFR, EST AFRICAN AMERICAN: 60 mL/min — AB (ref >60)
Glucose, Bld: 75 mg/dL (ref 70–140)
POTASSIUM: 3.6 mmol/L (ref 3.5–5.1)
SODIUM: 139 mmol/L (ref 136–145)
Total Bilirubin: 0.5 mg/dL (ref 0.2–1.2)
Total Protein: 6.3 g/dL — ABNORMAL LOW (ref 6.4–8.3)

## 2017-05-05 LAB — CBC WITH DIFFERENTIAL/PLATELET
Basophils Absolute: 0.1 10*3/uL (ref 0.0–0.1)
Basophils Relative: 2 %
Eosinophils Absolute: 0.2 10*3/uL (ref 0.0–0.5)
Eosinophils Relative: 3 %
HCT: 25.2 % — ABNORMAL LOW (ref 38.4–49.9)
HEMOGLOBIN: 8.5 g/dL — AB (ref 13.0–17.1)
LYMPHS ABS: 2.3 10*3/uL (ref 0.9–3.3)
Lymphocytes Relative: 43 %
MCH: 32.9 pg (ref 27.2–33.4)
MCHC: 33.7 g/dL (ref 32.0–36.0)
MCV: 97.7 fL (ref 79.3–98.0)
MONOS PCT: 3 %
Monocytes Absolute: 0.2 10*3/uL (ref 0.1–0.9)
NEUTROS ABS: 2.6 10*3/uL (ref 1.5–6.5)
NEUTROS PCT: 49 %
Platelets: 335 10*3/uL (ref 140–400)
RBC: 2.58 MIL/uL — AB (ref 4.20–5.82)
RDW: 15 % — ABNORMAL HIGH (ref 11.0–14.6)
WBC: 5.4 10*3/uL (ref 4.0–10.3)

## 2017-05-05 LAB — FERRITIN: Ferritin: 490 ng/mL — ABNORMAL HIGH (ref 22–316)

## 2017-05-05 LAB — IRON AND TIBC
Iron: 48 ug/dL (ref 42–163)
SATURATION RATIOS: 26 % — AB (ref 42–163)
TIBC: 186 ug/dL — ABNORMAL LOW (ref 202–409)
UIBC: 139 ug/dL

## 2017-05-05 LAB — MAGNESIUM: Magnesium: 0.7 mg/dL — CL (ref 1.7–2.4)

## 2017-05-05 MED ORDER — SODIUM CHLORIDE 0.9 % IV SOLN
16.6000 mg/kg | Freq: Once | INTRAVENOUS | Status: AC
  Administered 2017-05-05: 1100 mg via INTRAVENOUS
  Filled 2017-05-05: qty 40

## 2017-05-05 MED ORDER — SODIUM CHLORIDE 0.9 % IV SOLN
4.0000 g | Freq: Once | INTRAVENOUS | Status: AC
  Administered 2017-05-05: 4 g via INTRAVENOUS
  Filled 2017-05-05: qty 8

## 2017-05-05 MED ORDER — DIPHENHYDRAMINE HCL 25 MG PO CAPS
50.0000 mg | ORAL_CAPSULE | Freq: Once | ORAL | Status: AC
  Administered 2017-05-05: 50 mg via ORAL

## 2017-05-05 MED ORDER — METHYLPREDNISOLONE SODIUM SUCC 40 MG IJ SOLR
INTRAMUSCULAR | Status: AC
  Filled 2017-05-05: qty 1

## 2017-05-05 MED ORDER — DIPHENHYDRAMINE HCL 25 MG PO CAPS
ORAL_CAPSULE | ORAL | Status: AC
Start: 2017-05-05 — End: 2017-05-05
  Filled 2017-05-05: qty 2

## 2017-05-05 MED ORDER — HEPARIN SOD (PORK) LOCK FLUSH 100 UNIT/ML IV SOLN
500.0000 [IU] | Freq: Once | INTRAVENOUS | Status: DC | PRN
  Filled 2017-05-05: qty 5

## 2017-05-05 MED ORDER — ACETAMINOPHEN 325 MG PO TABS
ORAL_TABLET | ORAL | Status: AC
  Filled 2017-05-05: qty 2

## 2017-05-05 MED ORDER — SODIUM CHLORIDE 0.9 % IV SOLN
Freq: Once | INTRAVENOUS | Status: AC
  Administered 2017-05-05: 11:00:00 via INTRAVENOUS

## 2017-05-05 MED ORDER — METHYLPREDNISOLONE SODIUM SUCC 40 MG IJ SOLR
40.0000 mg | Freq: Once | INTRAMUSCULAR | Status: AC
Start: 2017-05-05 — End: 2017-05-05
  Administered 2017-05-05: 40 mg via INTRAVENOUS

## 2017-05-05 MED ORDER — MAGNESIUM OXIDE 400 (241.3 MG) MG PO TABS: 400.0000 mg | ORAL_TABLET | Freq: Every day | ORAL | 0 refills | Status: DC

## 2017-05-05 MED ORDER — ACETAMINOPHEN 325 MG PO TABS
650.0000 mg | ORAL_TABLET | Freq: Once | ORAL | Status: AC
  Administered 2017-05-05: 650 mg via ORAL

## 2017-05-05 MED ORDER — SODIUM CHLORIDE 0.9% FLUSH
10.0000 mL | Freq: Once | INTRAVENOUS | Status: AC
  Administered 2017-05-05: 10 mL
  Filled 2017-05-05: qty 10

## 2017-05-05 MED ORDER — PROCHLORPERAZINE MALEATE 10 MG PO TABS
10.0000 mg | ORAL_TABLET | Freq: Once | ORAL | Status: AC
  Administered 2017-05-05: 10 mg via ORAL

## 2017-05-05 MED ORDER — PROCHLORPERAZINE MALEATE 10 MG PO TABS
ORAL_TABLET | ORAL | Status: AC
  Filled 2017-05-05: qty 1

## 2017-05-05 NOTE — Patient Instructions (Addendum)
[facility] Discharge Instructions for Patients Receiving Chemotherapy  Today you received the following chemotherapy agents: Darzalex.  To help prevent nausea and vomiting after your treatment, we encourage you to take your nausea medication as directed.  If you develop nausea and vomiting that is not controlled by your nausea medication, call the clinic.   BELOW ARE SYMPTOMS THAT SHOULD BE REPORTED IMMEDIATELY:  *FEVER GREATER THAN 100.5 F  *CHILLS WITH OR WITHOUT FEVER  NAUSEA AND VOMITING THAT IS NOT CONTROLLED WITH YOUR NAUSEA MEDICATION  *UNUSUAL SHORTNESS OF BREATH  *UNUSUAL BRUISING OR BLEEDING  TENDERNESS IN MOUTH AND THROAT WITH OR WITHOUT PRESENCE OF ULCERS  *URINARY PROBLEMS  *BOWEL PROBLEMS  UNUSUAL RASH Items with * indicate a potential emergency and should be followed up as soon as possible.  Feel free to call the clinic should you have any questions or concerns. The clinic phone number is [phone].  Please show the [place] at check-in to the Emergency Department and triage nurse.  Hypomagnesemia Hypomagnesemia is a condition in which the level of magnesium in the blood is low. Magnesium is a mineral that is found in many foods. It is used in many different processes in the body. Hypomagnesemia can affect every organ in the body. It can cause life-threatening problems. What are the causes? Causes of hypomagnesemia include:  Not getting enough magnesium in your diet.  Malnutrition.  Problems with absorbing magnesium from the intestines.  Dehydration.  Alcohol abuse.  Vomiting.  Severe diarrhea.  Some medicines, including medicines that make you urinate more.  Certain diseases, such as kidney disease, diabetes, and overactive thyroid.  What are the signs or symptoms?  Involuntary shaking or trembling of a body part (tremor).  Confusion.  Muscle weakness.  Sensitivity to light, sound, and touch.  Psychiatric  issues, such as depression, irritability, or psychosis.  Sudden tightening of muscles (muscle spasms).  Tingling in the arms and legs.  A feeling of fluttering of the heart. These symptoms are more severe if magnesium levels drop suddenly. How is this diagnosed? To make a diagnosis, your health care provider will do a physical exam and order blood and urine tests. How is this treated? Treatment will depend on the cause and the severity of your condition. It may involve:  A magnesium supplement. This can be taken in pill form. It can also be given through an IV tube. This is usually done if the condition is severe.  Changes to your diet. You may be directed to eat foods that have a lot of magnesium, such as green leafy vegetables, peas, beans, and nuts.  Eliminating alcohol from your diet.  Follow these instructions at home:  Include foods with magnesium in your diet. Foods that are rich in magnesium include green vegetables, beans, nuts and seeds, and whole grains.  Take medicines only as directed by your health care provider.  Take magnesium supplements if your health care provider instructs you to do that. Take them as directed.  Have your magnesium levels monitored as directed by your health care provider.  When you are active, drink fluids that contain electrolytes.  Keep all follow-up visits as directed by your health care provider. This is important. Contact a health care provider if:  You get worse instead of better.  Your symptoms return. Get help right away if:  Your symptoms are severe. This information is not intended to replace advice given to you by your health care provider. Make sure you  discuss any questions you have with your health care provider. Document Released: 09/19/2004 Document Revised: 06/01/2015 Document Reviewed: 08/09/2013 Elsevier Interactive Patient Education  2018 [employer].

## 2017-05-05 NOTE — Assessment & Plan Note (Signed)
He has recurrent, chronic low magnesium We will give him oral magnesium along with intravenous magnesium replacement therapy 

## 2017-05-05 NOTE — Assessment & Plan Note (Signed)
He appears to be tolerating Pomalyst with expected side-effects His recent myeloma panel showed mild disease progression but overall he is not symptomatic except for the anemia. His chronic renal failure is unrelated We discussed the risk and benefit of continuing Pomalyst and he agreed to proceed For now, he will continue Pomalyst on day 1-21, rest 7 days and daratumumab once a month He will continue acyclovir for antimicrobial prophylaxis and aspirin for DVT prophylaxis He will continue calcium with vitamin D supplement If repeat myeloma panel from today's blood work shows disease progression, I have warned the patient the possibility of switching his treatment to Cytoxan, Kyprolis and dexamethasone versus referring him to another institution for clinical trial.

## 2017-05-05 NOTE — Progress Notes (Signed)
Corrected Ca = 8.4 today. PT is due for q 6 month Zometa today. Per Dr. Alvy Bimler, Hold Zometa today. Give in May.  Raul Del, PharmD, BCPS, BCOP

## 2017-05-05 NOTE — Progress Notes (Signed)
[facility] OFFICE PROGRESS NOTE  Patient Care Team: Lucianne Lei, MD as PCP - General (Family Medicine) Heath Lark, MD as Consulting Physician (Hematology and Oncology) Pleasant, [name], RN as [place] [facility] Beverely Pace, LCSW as Veterinary surgeon)  ASSESSMENT & PLAN:  Multiple myeloma in relapse [facility]) He appears to be tolerating Pomalyst with expected side-effects His recent myeloma panel showed mild disease progression but overall he is not symptomatic except for the anemia. His chronic renal failure is unrelated We discussed the risk and benefit of continuing Pomalyst and he agreed to proceed For now, he will continue Pomalyst on day 1-21, rest 7 days and daratumumab once a month He will continue acyclovir for antimicrobial prophylaxis and aspirin for DVT prophylaxis He will continue calcium with vitamin D supplement If repeat myeloma panel from today's blood work shows disease progression, I have warned the patient the possibility of switching his treatment to Cytoxan, Kyprolis and dexamethasone versus referring him to another institution for clinical trial.  Anemia in chronic renal disease He has multifactorial anemia, combination of anemia chronic kidney disease and recent blood loss/iron deficiency anemia His hemoglobin and iron studies are stable.  Continue close observation I plan to recheck iron studies again in his next visit  Chronic renal insufficiency, stage III (moderate) He had recent acute on chronic renal failure secondary to poor oral intake, diarrhea and dehydration I recommend increase oral fluid intake as tolerated We will monitor his kidney function carefully.  Hypomagnesemia He has recurrent, chronic low magnesium We will give him oral magnesium along with intravenous magnesium replacement therapy   No orders of the defined types were placed in this encounter.   INTERVAL HISTORY: Please see  below for problem oriented charting. He returns for chemotherapy and follow-up His appetite is stable He denies further diarrhea No recent skin rash The patient denies any recent signs or symptoms of bleeding such as spontaneous epistaxis, hematuria or hematochezia. He denies recent infection, fever or chills He reported normal blood pressure at home  SUMMARY OF ONCOLOGIC HISTORY:   Multiple myeloma in relapse ([place])   12/12/2010 Initial Diagnosis    Multiple myeloma      02/16/2014 Imaging    Skeletal survey showed diffuse osteopenia      03/02/2014 Bone Marrow Biopsy    Accession: [id number] BM biopsy showed only 5 % plasma cells. However, the biopsy was difficult and the bone was fragmented during the procedure. Cytogenetics and FISH study is normal      03/03/2014 Procedure    he has port placement      03/10/2014 - 05/19/2014 Chemotherapy    he received elotuzumab and revlimid      05/20/2014 - 02/25/2016 Chemotherapy    Treatment is switched to maintenance Revlimid only. Treatment is stopped due to progressive disease      11/15/2014 Adverse Reaction    He has mild worsening anemia. Does of Revlimid reduced to 5 mg 21 days on, 7 days off      03/06/2016 -  Chemotherapy    He received Daratumumab and Velcade. Velcade is stopped on 07/24/16      08/14/2016 Miscellaneous    Pomalyst is added along with Daratumumab      10/17/2016 Surgery    EGD was performed for coffee-ground emesis. Multiple linear superficial ulcers were noted in the esophagus from 25-35 cm from insertion. Severe esophagitis was noted from 30-35 cm from insertion. Multiple biopsies were obtained. A  small hiatal hernia was noted. The gastric cavity contained coffee-ground fluid, after suctioning and lavage, no obvious gastric lesions/erosion/ulceration/erythema was noted. Biopsies were taken from the antrum to rule out H. Pylori. Duodenum bulb appeared unremarkable. A small diverticulum was noted in  second portion of the duodenum.        12/06/2016 Imaging    Ct scan abdomen No acute findings.  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  Stable small hiatal hernia.  Colonic diverticulosis, without radiographic evidence of diverticulitis.       REVIEW OF SYSTEMS:   Constitutional: Denies fevers, chills or abnormal weight loss Eyes: Denies blurriness of vision Ears, nose, mouth, throat, and face: Denies mucositis or sore throat Respiratory: Denies cough, dyspnea or wheezes Cardiovascular: Denies palpitation, chest discomfort or lower extremity swelling Gastrointestinal:  Denies nausea, heartburn or change in bowel habits Skin: Denies abnormal skin rashes Lymphatics: Denies new lymphadenopathy or easy bruising Neurological:Denies numbness, tingling or new weaknesses Behavioral/Psych: Mood is stable, no new changes  All other systems were reviewed with the patient and are negative.  I have reviewed the past medical history, past surgical history, social history and family history with the patient and they are unchanged from previous note.  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  MEDICATIONS:  Current Outpatient Medications  Medication Sig Dispense Refill  . acyclovir (ZOVIRAX) 400 MG tablet Take 1 tablet (400 mg total) by mouth daily. 60 tablet 11  . allopurinol (ZYLOPRIM) 100 MG tablet Take 100 mg by mouth 2 (two) times daily.     Marland Kitchen amLODipine (NORVASC) 10 MG tablet Take 10 mg by mouth daily.  1  . aspirin EC 81 MG tablet Take 81 mg by mouth daily.    Marland Kitchen atorvastatin (LIPITOR) 20 MG tablet Take 20 mg by mouth at bedtime.     . brimonidine (ALPHAGAN) 0.15 % ophthalmic solution Place 1 drop into the right eye 3 (three) times daily.    . cholecalciferol (VITAMIN D) 1000 UNITS tablet Take 1,000 Units by mouth daily.    . dorzolamide-timolol (COSOPT) 22.3-6.8 MG/ML ophthalmic solution Place 1 drop into both eyes 2 (two) times  daily.     . fluorometholone (FML) 0.1 % ophthalmic suspension Place 1 drop into both eyes daily.    Marland Kitchen latanoprost (XALATAN) 0.005 % ophthalmic solution Place 1 drop into the right eye at bedtime.    . lidocaine-prilocaine (EMLA) cream Apply to affected area once 30 g 3  . loperamide (IMODIUM A-D) 2 MG tablet 1 po after each watery BM.  Maximum 6 per 24hrs. 30 tablet PRN  . magnesium oxide (MAG-OX) 400 (241.3 Mg) MG tablet Take 1 tablet (400 mg total) by mouth daily. 60 tablet 0  . ondansetron (ZOFRAN) 8 MG tablet Take 1 tablet (8 mg total) by mouth 2 (two) times daily as needed (Nausea or vomiting). 30 tablet 1  . pantoprazole (PROTONIX) 40 MG tablet Take 1 tablet (40 mg total) by mouth 2 (two) times daily before a meal. 60 tablet 9  . pomalidomide (POMALYST) 2 MG capsule Take 1 capsule (2 mg total) by mouth daily. Take with water on days 1-21. Repeat every 28 days. 21 capsule 0  . potassium chloride SA (K-DUR,KLOR-CON) 20 MEQ tablet Take 1 tablet (20 mEq total) by mouth 2 (two) times daily. 14 tablet 0   No current facility-administered medications for this visit.    Facility-Administered Medications Ordered in Other Visits  Medication Dose Route Frequency Provider Last Rate  Last Dose  . heparin lock flush 100 unit/mL  500 Units Intracatheter Once PRN Heath Lark, MD        PHYSICAL EXAMINATION: ECOG PERFORMANCE STATUS: 1 - Symptomatic but completely ambulatory  Vitals:   05/05/17 1037  BP: (!) 167/65  Pulse: 60  Resp: 18  Temp: (!) 97.4 F (36.3 C)  SpO2: 100%   Filed Weights   05/05/17 1037  Weight: 150 lb 4.8 oz (68.2 kg)    GENERAL:alert, no distress and comfortable SKIN: skin color, texture, turgor are normal, no rashes or significant lesions EYES: normal, Conjunctiva are pink and non-injected, sclera clear OROPHARYNX:no exudate, no erythema and lips, buccal mucosa, and tongue normal  NECK: supple, thyroid normal size, non-tender, without nodularity LYMPH:  no palpable  lymphadenopathy in the cervical, axillary or inguinal LUNGS: clear to auscultation and percussion with normal breathing effort HEART: regular rate & rhythm and no murmurs and no lower extremity edema ABDOMEN:abdomen soft, non-tender and normal bowel sounds Musculoskeletal:no cyanosis of digits and no clubbing  NEURO: alert & oriented x 3 with fluent speech, no focal motor/sensory deficits  LABORATORY DATA:  I have reviewed the data as listed    Component Value Date/Time   NA 139 05/05/2017 0929   NA 140 01/09/2017 0913   K 3.6 05/05/2017 0929   K 3.7 01/09/2017 0913   CL 109 05/05/2017 0929   CL 107 06/29/2012 1131   CO2 22 05/05/2017 0929   CO2 21 (L) 01/09/2017 0913   GLUCOSE 75 05/05/2017 0929   GLUCOSE 80 01/09/2017 0913   GLUCOSE 143 (H) 06/29/2012 1131   BUN 9 05/05/2017 0929   BUN 9.8 01/09/2017 0913   CREATININE 1.32 (H) 05/05/2017 0929   CREATININE 1.5 (H) 01/09/2017 0913   CALCIUM 7.6 (L) 05/05/2017 0929   CALCIUM 7.5 (L) 01/09/2017 0913   PROT 6.3 (L) 05/05/2017 0929   PROT 5.7 (L) 01/09/2017 0913   ALBUMIN 3.0 (L) 05/05/2017 0929   ALBUMIN 2.9 (L) 01/09/2017 0913   AST 15 05/05/2017 0929   AST 12 01/09/2017 0913   ALT 14 05/05/2017 0929   ALT 10 01/09/2017 0913   ALKPHOS 61 05/05/2017 0929   ALKPHOS 55 01/09/2017 0913   BILITOT 0.5 05/05/2017 0929   BILITOT 0.44 01/09/2017 0913   GFRNONAA 51 (L) 05/05/2017 0929   GFRAA 60 (L) 05/05/2017 0929    No results found for: SPEP, UPEP  Lab Results  Component Value Date   WBC 5.4 05/05/2017   NEUTROABS 2.6 05/05/2017   HGB 8.5 (L) 05/05/2017   HCT 25.2 (L) 05/05/2017   MCV 97.7 05/05/2017   PLT 335 05/05/2017      Chemistry      Component Value Date/Time   NA 139 05/05/2017 0929   NA 140 01/09/2017 0913   K 3.6 05/05/2017 0929   K 3.7 01/09/2017 0913   CL 109 05/05/2017 0929   CL 107 06/29/2012 1131   CO2 22 05/05/2017 0929   CO2 21 (L) 01/09/2017 0913   BUN 9 05/05/2017 0929   BUN 9.8 01/09/2017  0913   CREATININE 1.32 (H) 05/05/2017 0929   CREATININE 1.5 (H) 01/09/2017 0913      Component Value Date/Time   CALCIUM 7.6 (L) 05/05/2017 0929   CALCIUM 7.5 (L) 01/09/2017 0913   ALKPHOS 61 05/05/2017 0929   ALKPHOS 55 01/09/2017 0913   AST 15 05/05/2017 0929   AST 12 01/09/2017 0913   ALT 14 05/05/2017 0929   ALT 10 01/09/2017  0913   BILITOT 0.5 05/05/2017 0929   BILITOT 0.44 01/09/2017 0913       All questions were answered. The patient knows to call the clinic with any problems, questions or concerns. No barriers to learning was detected.  I spent 25 minutes counseling the patient face to face. The total time spent in the appointment was 30 minutes and more than 50% was on counseling and review of test results  Heath Lark, MD 05/05/2017 4:15 PM

## 2017-05-05 NOTE — Assessment & Plan Note (Signed)
He had recent acute on chronic renal failure secondary to poor oral intake, diarrhea and dehydration I recommend increase oral fluid intake as tolerated We will monitor his kidney function carefully.

## 2017-05-05 NOTE — Telephone Encounter (Signed)
Gave patient AVs and calendar of upcoming may appointments.  °

## 2017-05-05 NOTE — Assessment & Plan Note (Signed)
He has multifactorial anemia, combination of anemia chronic kidney disease and recent blood loss/iron deficiency anemia His hemoglobin and iron studies are stable.  Continue close observation I plan to recheck iron studies again in his next visit

## 2017-05-06 ENCOUNTER — Other Ambulatory Visit: Payer: Self-pay | Admitting: *Deleted

## 2017-05-06 DIAGNOSIS — C9002 Multiple myeloma in relapse: Secondary | ICD-10-CM

## 2017-05-06 LAB — KAPPA/LAMBDA LIGHT CHAINS
KAPPA, LAMDA LIGHT CHAIN RATIO: 9.7 — AB (ref 0.26–1.65)
Kappa free light chain: 141.6 mg/L — ABNORMAL HIGH (ref 3.3–19.4)
Lambda free light chains: 14.6 mg/L (ref 5.7–26.3)

## 2017-05-06 MED ORDER — POMALIDOMIDE 2 MG PO CAPS: 2.0000 mg | ORAL_CAPSULE | Freq: Every day | ORAL | 0 refills | Status: DC

## 2017-05-07 LAB — MULTIPLE MYELOMA PANEL, SERUM
ALBUMIN SERPL ELPH-MCNC: 2.9 g/dL (ref 2.9–4.4)
Albumin/Glob SerPl: 1.1 (ref 0.7–1.7)
Alpha 1: 0.3 g/dL (ref 0.0–0.4)
Alpha2 Glob SerPl Elph-Mcnc: 0.8 g/dL (ref 0.4–1.0)
B-Globulin SerPl Elph-Mcnc: 0.7 g/dL (ref 0.7–1.3)
Gamma Glob SerPl Elph-Mcnc: 1.1 g/dL (ref 0.4–1.8)
Globulin, Total: 2.9 g/dL (ref 2.2–3.9)
IGG (IMMUNOGLOBIN G), SERUM: 1319 mg/dL (ref 700–1600)
IGM (IMMUNOGLOBULIN M), SRM: 7 mg/dL — AB (ref 15–143)
IgA: 22 mg/dL — ABNORMAL LOW (ref 61–437)
M Protein SerPl Elph-Mcnc: 1 g/dL — ABNORMAL HIGH
TOTAL PROTEIN ELP: 5.8 g/dL — AB (ref 6.0–8.5)

## 2017-05-08 ENCOUNTER — Telehealth: Payer: Self-pay

## 2017-05-08 NOTE — Telephone Encounter (Addendum)
Attempted to reach pt by phone per Dr Alvy Bimler (note below), no answer at home or mobile number - was able to leave VM at home number, requesting pt to call office number.   ----- Message from Heath Lark, MD sent at 05/08/2017  9:39 AM EDT ----- Regarding: labs better pls tell patient that myeloma panel is better. No need to worry about change of chemo Continue treatment the same

## 2017-05-09 DIAGNOSIS — R413 Other amnesia: Secondary | ICD-10-CM | POA: Diagnosis not present

## 2017-05-09 DIAGNOSIS — E785 Hyperlipidemia, unspecified: Secondary | ICD-10-CM | POA: Diagnosis not present

## 2017-05-09 DIAGNOSIS — E0842 Diabetes mellitus due to underlying condition with diabetic polyneuropathy: Secondary | ICD-10-CM | POA: Diagnosis not present

## 2017-05-09 DIAGNOSIS — I10 Essential (primary) hypertension: Secondary | ICD-10-CM | POA: Diagnosis not present

## 2017-05-26 DIAGNOSIS — N189 Chronic kidney disease, unspecified: Secondary | ICD-10-CM | POA: Diagnosis not present

## 2017-05-26 DIAGNOSIS — C9000 Multiple myeloma not having achieved remission: Secondary | ICD-10-CM | POA: Diagnosis not present

## 2017-05-26 DIAGNOSIS — R069 Unspecified abnormalities of breathing: Secondary | ICD-10-CM | POA: Diagnosis not present

## 2017-05-26 DIAGNOSIS — I10 Essential (primary) hypertension: Secondary | ICD-10-CM | POA: Diagnosis not present

## 2017-06-05 ENCOUNTER — Telehealth: Payer: Self-pay

## 2017-06-05 ENCOUNTER — Telehealth: Payer: Self-pay | Admitting: Hematology and Oncology

## 2017-06-05 ENCOUNTER — Encounter: Payer: Self-pay | Admitting: Hematology and Oncology

## 2017-06-05 ENCOUNTER — Inpatient Hospital Stay: Payer: Medicare Other | Attending: Hematology and Oncology

## 2017-06-05 ENCOUNTER — Inpatient Hospital Stay (HOSPITAL_BASED_OUTPATIENT_CLINIC_OR_DEPARTMENT_OTHER): Payer: Medicare Other | Admitting: Hematology and Oncology

## 2017-06-05 ENCOUNTER — Inpatient Hospital Stay: Payer: Medicare Other

## 2017-06-05 VITALS — BP 122/64 | HR 51 | Resp 16

## 2017-06-05 DIAGNOSIS — Z95828 Presence of other vascular implants and grafts: Secondary | ICD-10-CM

## 2017-06-05 DIAGNOSIS — N183 Chronic kidney disease, stage 3 unspecified: Secondary | ICD-10-CM

## 2017-06-05 DIAGNOSIS — D631 Anemia in chronic kidney disease: Secondary | ICD-10-CM | POA: Diagnosis not present

## 2017-06-05 DIAGNOSIS — Z5112 Encounter for antineoplastic immunotherapy: Secondary | ICD-10-CM | POA: Diagnosis not present

## 2017-06-05 DIAGNOSIS — Z7982 Long term (current) use of aspirin: Secondary | ICD-10-CM

## 2017-06-05 DIAGNOSIS — E860 Dehydration: Secondary | ICD-10-CM | POA: Diagnosis not present

## 2017-06-05 DIAGNOSIS — D500 Iron deficiency anemia secondary to blood loss (chronic): Secondary | ICD-10-CM | POA: Insufficient documentation

## 2017-06-05 DIAGNOSIS — E876 Hypokalemia: Secondary | ICD-10-CM

## 2017-06-05 DIAGNOSIS — Z9221 Personal history of antineoplastic chemotherapy: Secondary | ICD-10-CM | POA: Insufficient documentation

## 2017-06-05 DIAGNOSIS — Z79899 Other long term (current) drug therapy: Secondary | ICD-10-CM | POA: Insufficient documentation

## 2017-06-05 DIAGNOSIS — C9002 Multiple myeloma in relapse: Secondary | ICD-10-CM | POA: Diagnosis not present

## 2017-06-05 LAB — CBC WITH DIFFERENTIAL/PLATELET
BASOS ABS: 0.2 10*3/uL — AB (ref 0.0–0.1)
Basophils Relative: 3 %
EOS ABS: 0.2 10*3/uL (ref 0.0–0.5)
EOS PCT: 3 %
HCT: 29.3 % — ABNORMAL LOW (ref 38.4–49.9)
Hemoglobin: 9.8 g/dL — ABNORMAL LOW (ref 13.0–17.1)
Lymphocytes Relative: 50 %
Lymphs Abs: 3.1 10*3/uL (ref 0.9–3.3)
MCH: 33.6 pg — ABNORMAL HIGH (ref 27.2–33.4)
MCHC: 33.3 g/dL (ref 32.0–36.0)
MCV: 100.9 fL — ABNORMAL HIGH (ref 79.3–98.0)
Monocytes Absolute: 0.4 10*3/uL (ref 0.1–0.9)
Monocytes Relative: 7 %
Neutro Abs: 2.3 10*3/uL (ref 1.5–6.5)
Neutrophils Relative %: 37 %
PLATELETS: 294 10*3/uL (ref 140–400)
RBC: 2.9 MIL/uL — AB (ref 4.20–5.82)
RDW: 16.8 % — ABNORMAL HIGH (ref 11.0–14.6)
WBC: 6.3 10*3/uL (ref 4.0–10.3)

## 2017-06-05 LAB — COMPREHENSIVE METABOLIC PANEL
ALT: 12 U/L (ref 0–55)
AST: 15 U/L (ref 5–34)
Albumin: 3.6 g/dL (ref 3.5–5.0)
Alkaline Phosphatase: 61 U/L (ref 40–150)
Anion gap: 7 (ref 3–11)
BUN: 16 mg/dL (ref 7–26)
CHLORIDE: 108 mmol/L (ref 98–109)
CO2: 23 mmol/L (ref 22–29)
CREATININE: 1.72 mg/dL — AB (ref 0.70–1.30)
Calcium: 8.9 mg/dL (ref 8.4–10.4)
GFR calc Af Amer: 43 mL/min — ABNORMAL LOW (ref >60)
GFR calc non Af Amer: 37 mL/min — ABNORMAL LOW (ref >60)
Glucose, Bld: 81 mg/dL (ref 70–140)
Potassium: 4.4 mmol/L (ref 3.5–5.1)
SODIUM: 138 mmol/L (ref 136–145)
Total Bilirubin: 0.5 mg/dL (ref 0.2–1.2)
Total Protein: 6.7 g/dL (ref 6.4–8.3)

## 2017-06-05 LAB — MAGNESIUM: MAGNESIUM: 1.4 mg/dL — AB (ref 1.7–2.4)

## 2017-06-05 MED ORDER — ZOLEDRONIC ACID 4 MG/5ML IV CONC
3.0000 mg | Freq: Once | INTRAVENOUS | Status: AC
  Administered 2017-06-05: 3 mg via INTRAVENOUS
  Filled 2017-06-05: qty 3.75

## 2017-06-05 MED ORDER — PROCHLORPERAZINE MALEATE 10 MG PO TABS
ORAL_TABLET | ORAL | Status: AC
  Filled 2017-06-05: qty 1

## 2017-06-05 MED ORDER — METHYLPREDNISOLONE SODIUM SUCC 40 MG IJ SOLR
INTRAMUSCULAR | Status: AC
  Filled 2017-06-05: qty 1

## 2017-06-05 MED ORDER — ACETAMINOPHEN 325 MG PO TABS
650.0000 mg | ORAL_TABLET | Freq: Once | ORAL | Status: AC
  Administered 2017-06-05: 650 mg via ORAL

## 2017-06-05 MED ORDER — HEPARIN SOD (PORK) LOCK FLUSH 100 UNIT/ML IV SOLN
500.0000 [IU] | Freq: Once | INTRAVENOUS | Status: AC | PRN
  Administered 2017-06-05: 500 [IU]
  Filled 2017-06-05: qty 5

## 2017-06-05 MED ORDER — DIPHENHYDRAMINE HCL 25 MG PO CAPS
ORAL_CAPSULE | ORAL | Status: AC
  Filled 2017-06-05: qty 2

## 2017-06-05 MED ORDER — SODIUM CHLORIDE 0.9% FLUSH
10.0000 mL | INTRAVENOUS | Status: DC | PRN
  Administered 2017-06-05: 10 mL
  Filled 2017-06-05: qty 10

## 2017-06-05 MED ORDER — SODIUM CHLORIDE 0.9 % IV SOLN
Freq: Once | INTRAVENOUS | Status: AC
  Administered 2017-06-05: 11:00:00 via INTRAVENOUS

## 2017-06-05 MED ORDER — DIPHENHYDRAMINE HCL 25 MG PO CAPS
50.0000 mg | ORAL_CAPSULE | Freq: Once | ORAL | Status: AC
  Administered 2017-06-05: 50 mg via ORAL

## 2017-06-05 MED ORDER — SODIUM CHLORIDE 0.9 % IV SOLN
16.8000 mg/kg | Freq: Once | INTRAVENOUS | Status: AC
  Administered 2017-06-05: 1100 mg via INTRAVENOUS
  Filled 2017-06-05: qty 40

## 2017-06-05 MED ORDER — PROCHLORPERAZINE MALEATE 10 MG PO TABS
10.0000 mg | ORAL_TABLET | Freq: Once | ORAL | Status: AC
  Administered 2017-06-05: 10 mg via ORAL

## 2017-06-05 MED ORDER — ACETAMINOPHEN 325 MG PO TABS
ORAL_TABLET | ORAL | Status: AC
  Filled 2017-06-05: qty 2

## 2017-06-05 MED ORDER — METHYLPREDNISOLONE SODIUM SUCC 40 MG IJ SOLR
40.0000 mg | Freq: Once | INTRAMUSCULAR | Status: AC
  Administered 2017-06-05: 40 mg via INTRAVENOUS

## 2017-06-05 MED ORDER — SODIUM CHLORIDE 0.9% FLUSH
10.0000 mL | Freq: Once | INTRAVENOUS | Status: AC
  Administered 2017-06-05: 10 mL
  Filled 2017-06-05: qty 10

## 2017-06-05 NOTE — Assessment & Plan Note (Signed)
He appears to be tolerating Pomalyst with expected side-effects His recent myeloma panel showed improved disease control His chronic renal failure is unrelated For now, he will continue Pomalyst on day 1-21, rest 7 days and daratumumab once a month He will continue acyclovir for antimicrobial prophylaxis and aspirin for DVT prophylaxis He will continue calcium with vitamin D supplement I will continue to see him once a month

## 2017-06-05 NOTE — Patient Instructions (Signed)
[facility] Discharge Instructions for Patients Receiving Chemotherapy  Today you received the following chemotherapy agents: Darzalex and Zometa.  To help prevent nausea and vomiting after your treatment, we encourage you to take your nausea medication as directed.   If you develop nausea and vomiting that is not controlled by your nausea medication, call the clinic.   BELOW ARE SYMPTOMS THAT SHOULD BE REPORTED IMMEDIATELY:  *FEVER GREATER THAN 100.5 F  *CHILLS WITH OR WITHOUT FEVER  NAUSEA AND VOMITING THAT IS NOT CONTROLLED WITH YOUR NAUSEA MEDICATION  *UNUSUAL SHORTNESS OF BREATH  *UNUSUAL BRUISING OR BLEEDING  TENDERNESS IN MOUTH AND THROAT WITH OR WITHOUT PRESENCE OF ULCERS  *URINARY PROBLEMS  *BOWEL PROBLEMS  UNUSUAL RASH Items with * indicate a potential emergency and should be followed up as soon as possible.  Feel free to call the clinic should you have any questions or concerns. The clinic phone number is [phone].  Please show the CHEMO ALERT CARD at check-in to the Emergency Department and triage nurse.   

## 2017-06-05 NOTE — Assessment & Plan Note (Signed)
He has multifactorial anemia, combination of anemia chronic kidney disease and recent blood loss/iron deficiency anemia His hemoglobin and iron studies are stable.  Continue close observation I plan to recheck iron studies again in his next visit

## 2017-06-05 NOTE — Telephone Encounter (Signed)
-----   Message from Heath Lark, MD sent at 06/05/2017 11:01 AM EDT ----- Regarding: magnesium is better He will just continue on oral magnesium No need IV magnesium Proceed with treatment ----- Message ----- From: Interface, Lab In [facility] Sent: 06/05/2017  10:19 AM To: Heath Lark, MD

## 2017-06-05 NOTE — Telephone Encounter (Signed)
Gave patient AVs and calendar of upcoming appointments.  °

## 2017-06-05 NOTE — Progress Notes (Signed)
[place] OFFICE PROGRESS NOTE  Patient Care Team: Lucianne Lei, MD as PCP - General (Family Medicine) Heath Lark, MD as Consulting Physician (Hematology and Oncology) Pleasant, [name], RN as [place] Management Beverely Pace, LCSW as Veterinary surgeon)  ASSESSMENT & PLAN:  Multiple myeloma in relapse [facility]) He appears to be tolerating Pomalyst with expected side-effects His recent myeloma panel showed improved disease control His chronic renal failure is unrelated For now, he will continue Pomalyst on day 1-21, rest 7 days and daratumumab once a month He will continue acyclovir for antimicrobial prophylaxis and aspirin for DVT prophylaxis He will continue calcium with vitamin D supplement I will continue to see him once a month  Anemia in chronic renal disease He has multifactorial anemia, combination of anemia chronic kidney disease and recent blood loss/iron deficiency anemia His hemoglobin and iron studies are stable.  Continue close observation I plan to recheck iron studies again in his next visit  Chronic renal insufficiency, stage III (moderate) He had recent acute on chronic renal failure secondary to poor oral intake, diarrhea and dehydration I recommend increase oral fluid intake as tolerated We will monitor his kidney function carefully.  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   INTERVAL HISTORY: Please see below for problem oriented charting. He returns for further follow-up and chemotherapy He is doing well Denies recent infection No new bone pain Denies recent diarrhea Overall, he felt better No major side effects from treatment so far He is compliant taking all the  medications as directed  SUMMARY OF ONCOLOGIC HISTORY:   Multiple myeloma in relapse ([place])   12/12/2010 Initial Diagnosis    Multiple myeloma      02/16/2014 Imaging    Skeletal survey showed diffuse osteopenia      03/02/2014 Bone Marrow Biopsy    Accession: [id number] BM biopsy showed only 5 % plasma cells. However, the biopsy was difficult and the bone was fragmented during the procedure. Cytogenetics and FISH study is normal      03/03/2014 Procedure    he has port placement      03/10/2014 - 05/19/2014 Chemotherapy    he received elotuzumab and revlimid      05/20/2014 - 02/25/2016 Chemotherapy    Treatment is switched to maintenance Revlimid only. Treatment is stopped due to progressive disease      11/15/2014 Adverse Reaction    He has mild worsening anemia. Does of Revlimid reduced to 5 mg 21 days on, 7 days off      03/06/2016 -  Chemotherapy    He received Daratumumab and Velcade. Velcade is stopped on 07/24/16      08/14/2016 Miscellaneous    Pomalyst is added along with Daratumumab      10/17/2016 Surgery    EGD was performed for coffee-ground emesis. Multiple linear superficial ulcers were noted in the esophagus from 25-35 cm from insertion. Severe esophagitis was noted from 30-35 cm from insertion. Multiple biopsies were obtained. A small hiatal hernia was noted. The gastric cavity contained coffee-ground fluid, after suctioning and lavage, no obvious gastric lesions/erosion/ulceration/erythema was noted. Biopsies were taken from the antrum to rule out H. Pylori. Duodenum bulb appeared unremarkable. A small diverticulum was noted in second portion of the duodenum.  12/06/2016 Imaging    Ct scan abdomen No acute findings.  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  Stable small hiatal hernia.  Colonic diverticulosis, without radiographic evidence of diverticulitis.       REVIEW OF SYSTEMS:   Constitutional:  Denies fevers, chills or abnormal weight loss Eyes: Denies blurriness of vision Ears, nose, mouth, throat, and face: Denies mucositis or sore throat Respiratory: Denies cough, dyspnea or wheezes Cardiovascular: Denies palpitation, chest discomfort or lower extremity swelling Gastrointestinal:  Denies nausea, heartburn or change in bowel habits Skin: Denies abnormal skin rashes Lymphatics: Denies new lymphadenopathy or easy bruising Neurological:Denies numbness, tingling or new weaknesses Behavioral/Psych: Mood is stable, no new changes  All other systems were reviewed with the patient and are negative.  I have reviewed the past medical history, past surgical history, social history and family history with the patient and they are unchanged from previous note.  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  MEDICATIONS:  Current Outpatient Medications  Medication Sig Dispense Refill  . acyclovir (ZOVIRAX) 400 MG tablet Take 1 tablet (400 mg total) by mouth daily. 60 tablet 11  . allopurinol (ZYLOPRIM) 100 MG tablet Take 100 mg by mouth 2 (two) times daily.     Marland Kitchen amLODipine (NORVASC) 10 MG tablet Take 10 mg by mouth daily.  1  . aspirin EC 81 MG tablet Take 81 mg by mouth daily.    Marland Kitchen atorvastatin (LIPITOR) 20 MG tablet Take 20 mg by mouth at bedtime.     . brimonidine (ALPHAGAN) 0.15 % ophthalmic solution Place 1 drop into the right eye 3 (three) times daily.    . cholecalciferol (VITAMIN D) 1000 UNITS tablet Take 1,000 Units by mouth daily.    . dorzolamide-timolol (COSOPT) 22.3-6.8 MG/ML ophthalmic solution Place 1 drop into both eyes 2 (two) times daily.     . fluorometholone (FML) 0.1 % ophthalmic suspension Place 1 drop into both eyes daily.    Marland Kitchen latanoprost (XALATAN) 0.005 % ophthalmic solution Place 1 drop into the right eye at bedtime.    . lidocaine-prilocaine (EMLA) cream Apply to affected area once 30 g 3  . loperamide (IMODIUM A-D) 2 MG tablet 1 po after each watery BM.   Maximum 6 per 24hrs. 30 tablet PRN  . magnesium oxide (MAG-OX) 400 (241.3 Mg) MG tablet Take 1 tablet (400 mg total) by mouth daily. 60 tablet 0  . ondansetron (ZOFRAN) 8 MG tablet Take 1 tablet (8 mg total) by mouth 2 (two) times daily as needed (Nausea or vomiting). 30 tablet 1  . pantoprazole (PROTONIX) 40 MG tablet Take 1 tablet (40 mg total) by mouth 2 (two) times daily before a meal. 60 tablet 9  . pomalidomide (POMALYST) 2 MG capsule Take 1 capsule (2 mg total) by mouth daily. Take with water on days 1-21. Repeat every 28 days. 21 capsule 0  . potassium chloride SA (K-DUR,KLOR-CON) 20 MEQ tablet Take 1 tablet (20 mEq total) by mouth 2 (two) times daily. 14 tablet 0   No current facility-administered medications for this visit.     PHYSICAL EXAMINATION: ECOG PERFORMANCE STATUS: 1 - Symptomatic but completely ambulatory  Vitals:   06/05/17 1006  BP: 137/63  Pulse: (!) 51  Resp: 18  Temp: (!) 97.5 F (36.4 C)  SpO2: 100%   Filed Weights   06/05/17 1006  Weight: 155 lb (70.3 kg)    GENERAL:alert, no distress and comfortable SKIN: skin color, texture, turgor are normal, no  rashes or significant lesions EYES: normal, Conjunctiva are pink and non-injected, sclera clear OROPHARYNX:no exudate, no erythema and lips, buccal mucosa, and tongue normal  NECK: supple, thyroid normal size, non-tender, without nodularity LYMPH:  no palpable lymphadenopathy in the cervical, axillary or inguinal LUNGS: clear to auscultation and percussion with normal breathing effort HEART: regular rate & rhythm and no murmurs and no lower extremity edema ABDOMEN:abdomen soft, non-tender and normal bowel sounds Musculoskeletal:no cyanosis of digits and no clubbing  NEURO: alert & oriented x 3 with fluent speech, no focal motor/sensory deficits  LABORATORY DATA:  I have reviewed the data as listed    Component Value Date/Time   NA 138 06/05/2017 0934   NA 140 01/09/2017 0913   K 4.4 06/05/2017 0934    K 3.7 01/09/2017 0913   CL 108 06/05/2017 0934   CL 107 06/29/2012 1131   CO2 23 06/05/2017 0934   CO2 21 (L) 01/09/2017 0913   GLUCOSE 81 06/05/2017 0934   GLUCOSE 80 01/09/2017 0913   GLUCOSE 143 (H) 06/29/2012 1131   BUN 16 06/05/2017 0934   BUN 9.8 01/09/2017 0913   CREATININE 1.72 (H) 06/05/2017 0934   CREATININE 1.5 (H) 01/09/2017 0913   CALCIUM 8.9 06/05/2017 0934   CALCIUM 7.5 (L) 01/09/2017 0913   PROT 6.7 06/05/2017 0934   PROT 5.7 (L) 01/09/2017 0913   ALBUMIN 3.6 06/05/2017 0934   ALBUMIN 2.9 (L) 01/09/2017 0913   AST 15 06/05/2017 0934   AST 12 01/09/2017 0913   ALT 12 06/05/2017 0934   ALT 10 01/09/2017 0913   ALKPHOS 61 06/05/2017 0934   ALKPHOS 55 01/09/2017 0913   BILITOT 0.5 06/05/2017 0934   BILITOT 0.44 01/09/2017 0913   GFRNONAA 37 (L) 06/05/2017 0934   GFRAA 43 (L) 06/05/2017 0934    No results found for: SPEP, UPEP  Lab Results  Component Value Date   WBC 6.3 06/05/2017   NEUTROABS 2.3 06/05/2017   HGB 9.8 (L) 06/05/2017   HCT 29.3 (L) 06/05/2017   MCV 100.9 (H) 06/05/2017   PLT 294 06/05/2017      Chemistry      Component Value Date/Time   NA 138 06/05/2017 0934   NA 140 01/09/2017 0913   K 4.4 06/05/2017 0934   K 3.7 01/09/2017 0913   CL 108 06/05/2017 0934   CL 107 06/29/2012 1131   CO2 23 06/05/2017 0934   CO2 21 (L) 01/09/2017 0913   BUN 16 06/05/2017 0934   BUN 9.8 01/09/2017 0913   CREATININE 1.72 (H) 06/05/2017 0934   CREATININE 1.5 (H) 01/09/2017 0913      Component Value Date/Time   CALCIUM 8.9 06/05/2017 0934   CALCIUM 7.5 (L) 01/09/2017 0913   ALKPHOS 61 06/05/2017 0934   ALKPHOS 55 01/09/2017 0913   AST 15 06/05/2017 0934   AST 12 01/09/2017 0913   ALT 12 06/05/2017 0934   ALT 10 01/09/2017 0913   BILITOT 0.5 06/05/2017 0934   BILITOT 0.44 01/09/2017 0913       All questions were answered. The patient knows to call the clinic with any problems, questions or concerns. No barriers to learning was  detected.  I spent 15 minutes counseling the patient face to face. The total time spent in the appointment was 20 minutes and more than 50% was on counseling and review of test results  Heath Lark, MD 06/05/2017 11:08 AM

## 2017-06-05 NOTE — Assessment & Plan Note (Signed)
He had recent acute on chronic renal failure secondary to poor oral intake, diarrhea and dehydration I recommend increase oral fluid intake as tolerated We will monitor his kidney function carefully.

## 2017-06-05 NOTE — Telephone Encounter (Signed)
Called and given below message to nurse Kathlee Nations, she will give below message to patient,

## 2017-06-05 NOTE — Assessment & Plan Note (Signed)
He has recurrent, chronic low magnesium He had received aggressive IV magnesium replacement recently Magnesium level is better He will continue chronic oral magnesium replacement therapy

## 2017-06-05 NOTE — Progress Notes (Signed)
Okay for treatment today with Magnesium results, patient to continue oral supplements at home.  Also, okay for Zometa infusion with creatinine results per Dr. Alvy Bimler.

## 2017-06-06 LAB — KAPPA/LAMBDA LIGHT CHAINS
Kappa free light chain: 196.9 mg/L — ABNORMAL HIGH (ref 3.3–19.4)
Kappa, lambda light chain ratio: 14.59 — ABNORMAL HIGH (ref 0.26–1.65)
Lambda free light chains: 13.5 mg/L (ref 5.7–26.3)

## 2017-06-10 LAB — MULTIPLE MYELOMA PANEL, SERUM
ALBUMIN SERPL ELPH-MCNC: 3.5 g/dL (ref 2.9–4.4)
Albumin/Glob SerPl: 1.3 (ref 0.7–1.7)
Alpha 1: 0.2 g/dL (ref 0.0–0.4)
Alpha2 Glob SerPl Elph-Mcnc: 0.6 g/dL (ref 0.4–1.0)
B-GLOBULIN SERPL ELPH-MCNC: 0.8 g/dL (ref 0.7–1.3)
Gamma Glob SerPl Elph-Mcnc: 1.2 g/dL (ref 0.4–1.8)
Globulin, Total: 2.8 g/dL (ref 2.2–3.9)
IGG (IMMUNOGLOBIN G), SERUM: 1402 mg/dL (ref 700–1600)
IGM (IMMUNOGLOBULIN M), SRM: 13 mg/dL — AB (ref 15–143)
IgA: 25 mg/dL — ABNORMAL LOW (ref 61–437)
M PROTEIN SERPL ELPH-MCNC: 1.1 g/dL — AB
TOTAL PROTEIN ELP: 6.3 g/dL (ref 6.0–8.5)

## 2017-06-18 ENCOUNTER — Other Ambulatory Visit: Payer: Self-pay | Admitting: *Deleted

## 2017-06-18 DIAGNOSIS — C9002 Multiple myeloma in relapse: Secondary | ICD-10-CM

## 2017-06-18 MED ORDER — POMALIDOMIDE 2 MG PO CAPS: 2.0000 mg | ORAL_CAPSULE | Freq: Every day | ORAL | 0 refills | Status: DC

## 2017-07-01 DIAGNOSIS — N189 Chronic kidney disease, unspecified: Secondary | ICD-10-CM | POA: Diagnosis not present

## 2017-07-01 DIAGNOSIS — I10 Essential (primary) hypertension: Secondary | ICD-10-CM | POA: Diagnosis not present

## 2017-07-01 DIAGNOSIS — R609 Edema, unspecified: Secondary | ICD-10-CM | POA: Diagnosis not present

## 2017-07-01 DIAGNOSIS — C61 Malignant neoplasm of prostate: Secondary | ICD-10-CM | POA: Diagnosis not present

## 2017-07-03 ENCOUNTER — Inpatient Hospital Stay: Payer: Medicare Other | Attending: Hematology and Oncology

## 2017-07-03 ENCOUNTER — Encounter: Payer: Self-pay | Admitting: Hematology and Oncology

## 2017-07-03 ENCOUNTER — Inpatient Hospital Stay (HOSPITAL_BASED_OUTPATIENT_CLINIC_OR_DEPARTMENT_OTHER): Payer: Medicare Other | Admitting: Hematology and Oncology

## 2017-07-03 ENCOUNTER — Other Ambulatory Visit: Payer: Self-pay | Admitting: Hematology and Oncology

## 2017-07-03 ENCOUNTER — Telehealth: Payer: Self-pay | Admitting: Hematology and Oncology

## 2017-07-03 ENCOUNTER — Inpatient Hospital Stay: Payer: Medicare Other

## 2017-07-03 VITALS — BP 123/64 | HR 50 | Resp 17

## 2017-07-03 DIAGNOSIS — N183 Chronic kidney disease, stage 3 unspecified: Secondary | ICD-10-CM

## 2017-07-03 DIAGNOSIS — Z5112 Encounter for antineoplastic immunotherapy: Secondary | ICD-10-CM | POA: Insufficient documentation

## 2017-07-03 DIAGNOSIS — D539 Nutritional anemia, unspecified: Secondary | ICD-10-CM

## 2017-07-03 DIAGNOSIS — E876 Hypokalemia: Secondary | ICD-10-CM

## 2017-07-03 DIAGNOSIS — Z79899 Other long term (current) drug therapy: Secondary | ICD-10-CM | POA: Diagnosis not present

## 2017-07-03 DIAGNOSIS — Z7982 Long term (current) use of aspirin: Secondary | ICD-10-CM | POA: Diagnosis not present

## 2017-07-03 DIAGNOSIS — E860 Dehydration: Secondary | ICD-10-CM

## 2017-07-03 DIAGNOSIS — C9002 Multiple myeloma in relapse: Secondary | ICD-10-CM

## 2017-07-03 DIAGNOSIS — Z95828 Presence of other vascular implants and grafts: Secondary | ICD-10-CM

## 2017-07-03 DIAGNOSIS — D631 Anemia in chronic kidney disease: Secondary | ICD-10-CM | POA: Insufficient documentation

## 2017-07-03 LAB — CBC WITH DIFFERENTIAL/PLATELET
Basophils Absolute: 0.1 10*3/uL (ref 0.0–0.1)
Basophils Relative: 3 %
Eosinophils Absolute: 0.2 10*3/uL (ref 0.0–0.5)
Eosinophils Relative: 3 %
HEMATOCRIT: 29.4 % — AB (ref 38.4–49.9)
HEMOGLOBIN: 9.9 g/dL — AB (ref 13.0–17.1)
LYMPHS ABS: 2.4 10*3/uL (ref 0.9–3.3)
Lymphocytes Relative: 44 %
MCH: 33.3 pg (ref 27.2–33.4)
MCHC: 33.7 g/dL (ref 32.0–36.0)
MCV: 98.9 fL — AB (ref 79.3–98.0)
Monocytes Absolute: 0.3 10*3/uL (ref 0.1–0.9)
Monocytes Relative: 6 %
NEUTROS ABS: 2.4 10*3/uL (ref 1.5–6.5)
NEUTROS PCT: 44 %
Platelets: 286 10*3/uL (ref 140–400)
RBC: 2.97 MIL/uL — ABNORMAL LOW (ref 4.20–5.82)
RDW: 16.7 % — ABNORMAL HIGH (ref 11.0–14.6)
WBC: 5.4 10*3/uL (ref 4.0–10.3)

## 2017-07-03 LAB — COMPREHENSIVE METABOLIC PANEL
ALK PHOS: 63 U/L (ref 38–126)
ALT: 16 U/L (ref 0–44)
ANION GAP: 5 (ref 5–15)
AST: 15 U/L (ref 15–41)
Albumin: 3.6 g/dL (ref 3.5–5.0)
BILIRUBIN TOTAL: 0.4 mg/dL (ref 0.3–1.2)
BUN: 20 mg/dL (ref 8–23)
CO2: 23 mmol/L (ref 22–32)
Calcium: 9 mg/dL (ref 8.9–10.3)
Chloride: 107 mmol/L (ref 98–111)
Creatinine, Ser: 2.06 mg/dL — ABNORMAL HIGH (ref 0.61–1.24)
GFR calc non Af Amer: 30 mL/min — ABNORMAL LOW (ref >60)
GFR, EST AFRICAN AMERICAN: 35 mL/min — AB (ref >60)
Glucose, Bld: 110 mg/dL — ABNORMAL HIGH (ref 70–99)
Potassium: 4.5 mmol/L (ref 3.5–5.1)
Sodium: 135 mmol/L (ref 135–145)
TOTAL PROTEIN: 6.9 g/dL (ref 6.5–8.1)

## 2017-07-03 LAB — MAGNESIUM: Magnesium: 1.4 mg/dL — CL (ref 1.7–2.4)

## 2017-07-03 MED ORDER — DIPHENHYDRAMINE HCL 25 MG PO CAPS
ORAL_CAPSULE | ORAL | Status: AC
  Filled 2017-07-03: qty 2

## 2017-07-03 MED ORDER — SODIUM CHLORIDE 0.9 % IV SOLN
16.8000 mg/kg | Freq: Once | INTRAVENOUS | Status: AC
  Administered 2017-07-03: 1100 mg via INTRAVENOUS
  Filled 2017-07-03: qty 40

## 2017-07-03 MED ORDER — PROCHLORPERAZINE MALEATE 10 MG PO TABS
10.0000 mg | ORAL_TABLET | Freq: Once | ORAL | Status: AC
  Administered 2017-07-03: 10 mg via ORAL

## 2017-07-03 MED ORDER — HEPARIN SOD (PORK) LOCK FLUSH 100 UNIT/ML IV SOLN
500.0000 [IU] | Freq: Once | INTRAVENOUS | Status: AC | PRN
  Administered 2017-07-03: 500 [IU]
  Filled 2017-07-03: qty 5

## 2017-07-03 MED ORDER — ACETAMINOPHEN 325 MG PO TABS
ORAL_TABLET | ORAL | Status: AC
  Filled 2017-07-03: qty 2

## 2017-07-03 MED ORDER — SODIUM CHLORIDE 0.9% FLUSH
10.0000 mL | INTRAVENOUS | Status: DC | PRN
  Administered 2017-07-03: 10 mL
  Filled 2017-07-03: qty 10

## 2017-07-03 MED ORDER — ACETAMINOPHEN 325 MG PO TABS
650.0000 mg | ORAL_TABLET | Freq: Once | ORAL | Status: AC
  Administered 2017-07-03: 650 mg via ORAL

## 2017-07-03 MED ORDER — SODIUM CHLORIDE 0.9% FLUSH
10.0000 mL | Freq: Once | INTRAVENOUS | Status: AC
  Administered 2017-07-03: 10 mL
  Filled 2017-07-03: qty 10

## 2017-07-03 MED ORDER — DIPHENHYDRAMINE HCL 25 MG PO CAPS
50.0000 mg | ORAL_CAPSULE | Freq: Once | ORAL | Status: AC
  Administered 2017-07-03: 50 mg via ORAL

## 2017-07-03 MED ORDER — MAGNESIUM OXIDE 400 (241.3 MG) MG PO TABS: 400.0000 mg | ORAL_TABLET | Freq: Two times a day (BID) | ORAL | 9 refills | Status: DC

## 2017-07-03 MED ORDER — SODIUM CHLORIDE 0.9 % IV SOLN
Freq: Once | INTRAVENOUS | Status: AC
  Administered 2017-07-03: 12:00:00 via INTRAVENOUS

## 2017-07-03 MED ORDER — SODIUM CHLORIDE 0.9 % IV SOLN
2.0000 g | Freq: Once | INTRAVENOUS | Status: AC
  Administered 2017-07-03: 2 g via INTRAVENOUS
  Filled 2017-07-03: qty 4

## 2017-07-03 MED ORDER — METHYLPREDNISOLONE SODIUM SUCC 40 MG IJ SOLR
INTRAMUSCULAR | Status: AC
Start: 2017-07-03 — End: ?
  Filled 2017-07-03: qty 1

## 2017-07-03 MED ORDER — METHYLPREDNISOLONE SODIUM SUCC 40 MG IJ SOLR
40.0000 mg | Freq: Once | INTRAMUSCULAR | Status: AC
  Administered 2017-07-03: 40 mg via INTRAVENOUS

## 2017-07-03 MED ORDER — PROCHLORPERAZINE MALEATE 10 MG PO TABS
ORAL_TABLET | ORAL | Status: AC
  Filled 2017-07-03: qty 1

## 2017-07-03 NOTE — Telephone Encounter (Signed)
Gave avs and calendar ° °

## 2017-07-03 NOTE — Progress Notes (Signed)
[place] OFFICE PROGRESS NOTE  Patient Care Team: Lucianne Lei, MD as PCP - General (Family Medicine) Heath Lark, MD as Consulting Physician (Hematology and Oncology) Pleasant, [name], RN as [place] Management Beverely Pace, LCSW as Veterinary surgeon)  ASSESSMENT & PLAN:  Multiple myeloma in relapse [facility]) He appears to be tolerating Pomalyst with expected side-effects His recent myeloma panel showed fluctuated but overall stable His chronic renal failure is unrelated For now, he will continue Pomalyst on day 1-21, rest 7 days and daratumumab once a month He will continue acyclovir for antimicrobial prophylaxis and aspirin for DVT prophylaxis He will continue calcium with vitamin D supplement I will continue to see him once a month  Chronic renal insufficiency, stage III (moderate) He had recent acute on chronic renal failure secondary to poor oral intake, diarrhea and dehydration I recommend increase oral fluid intake as tolerated We will monitor his kidney function carefully. This is likely the cause of the elevated serum light chain recently  Deficiency anemia He has multifactorial anemia, combination of anemia chronic kidney disease and recent blood loss/iron deficiency anemia His hemoglobin and iron studies are stable.  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   INTERVAL HISTORY: Please see below for problem oriented charting. He returns for chemotherapy and follow-up He is compliant taking his medications as prescribed The patient denies any recent signs or symptoms of bleeding such as spontaneous epistaxis, hematuria or hematochezia. He denies recent  infection, fever or chills Denies recent new bone pain  SUMMARY OF ONCOLOGIC HISTORY:   Multiple myeloma in relapse ([place])   12/12/2010 Initial Diagnosis    Multiple myeloma      02/16/2014 Imaging    Skeletal survey showed diffuse osteopenia      03/02/2014 Bone Marrow Biopsy    Accession: [id number] BM biopsy showed only 5 % plasma cells. However, the biopsy was difficult and the bone was fragmented during the procedure. Cytogenetics and FISH study is normal      03/03/2014 Procedure    he has port placement      03/10/2014 - 05/19/2014 Chemotherapy    he received elotuzumab and revlimid      05/20/2014 - 02/25/2016 Chemotherapy    Treatment is switched to maintenance Revlimid only. Treatment is stopped due to progressive disease      11/15/2014 Adverse Reaction    He has mild worsening anemia. Does of Revlimid reduced to 5 mg 21 days on, 7 days off      03/06/2016 -  Chemotherapy    He received Daratumumab and Velcade. Velcade is stopped on 07/24/16      08/14/2016 Miscellaneous    Pomalyst is added along with Daratumumab      10/17/2016 Surgery    EGD was performed for coffee-ground emesis. Multiple linear superficial ulcers were noted in the esophagus from 25-35 cm from insertion. Severe esophagitis was noted from 30-35 cm from insertion. Multiple biopsies were obtained. A small hiatal hernia was noted. The gastric cavity contained coffee-ground fluid, after suctioning and lavage, no obvious gastric lesions/erosion/ulceration/erythema was noted. Biopsies were taken from the antrum to rule out H. Pylori. Duodenum bulb appeared unremarkable. A small diverticulum was noted in second portion  of the duodenum.        12/06/2016 Imaging    Ct scan abdomen No acute findings.  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  Stable small hiatal hernia.  Colonic diverticulosis, without radiographic evidence of diverticulitis.       REVIEW  OF SYSTEMS:   Constitutional: Denies fevers, chills or abnormal weight loss Eyes: Denies blurriness of vision Ears, nose, mouth, throat, and face: Denies mucositis or sore throat Respiratory: Denies cough, dyspnea or wheezes Cardiovascular: Denies palpitation, chest discomfort or lower extremity swelling Gastrointestinal:  Denies nausea, heartburn or change in bowel habits Skin: Denies abnormal skin rashes Lymphatics: Denies new lymphadenopathy or easy bruising Neurological:Denies numbness, tingling or new weaknesses Behavioral/Psych: Mood is stable, no new changes  All other systems were reviewed with the patient and are negative.  I have reviewed the past medical history, past surgical history, social history and family history with the patient and they are unchanged from previous note.  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  MEDICATIONS:  Current Outpatient Medications  Medication Sig Dispense Refill  . acyclovir (ZOVIRAX) 400 MG tablet Take 1 tablet (400 mg total) by mouth daily. 60 tablet 11  . allopurinol (ZYLOPRIM) 100 MG tablet Take 100 mg by mouth 2 (two) times daily.     Marland Kitchen amLODipine (NORVASC) 10 MG tablet Take 10 mg by mouth daily.  1  . aspirin EC 81 MG tablet Take 81 mg by mouth daily.    Marland Kitchen atorvastatin (LIPITOR) 20 MG tablet Take 20 mg by mouth at bedtime.     . brimonidine (ALPHAGAN) 0.15 % ophthalmic solution Place 1 drop into the right eye 3 (three) times daily.    . cholecalciferol (VITAMIN D) 1000 UNITS tablet Take 1,000 Units by mouth daily.    . dorzolamide-timolol (COSOPT) 22.3-6.8 MG/ML ophthalmic solution Place 1 drop into both eyes 2 (two) times daily.     . fluorometholone (FML) 0.1 % ophthalmic suspension Place 1 drop into both eyes daily.    Marland Kitchen latanoprost (XALATAN) 0.005 % ophthalmic solution Place 1 drop into the right eye at bedtime.    . lidocaine-prilocaine (EMLA) cream Apply to affected area once 30 g 3  . loperamide (IMODIUM A-D) 2 MG  tablet 1 po after each watery BM.  Maximum 6 per 24hrs. 30 tablet PRN  . magnesium oxide (MAG-OX) 400 (241.3 Mg) MG tablet Take 1 tablet (400 mg total) by mouth daily. 60 tablet 0  . ondansetron (ZOFRAN) 8 MG tablet Take 1 tablet (8 mg total) by mouth 2 (two) times daily as needed (Nausea or vomiting). 30 tablet 1  . pantoprazole (PROTONIX) 40 MG tablet Take 1 tablet (40 mg total) by mouth 2 (two) times daily before a meal. 60 tablet 9  . pomalidomide (POMALYST) 2 MG capsule Take 1 capsule (2 mg total) by mouth daily. Take with water on days 1-21. Repeat every 28 days. 21 capsule 0  . potassium chloride SA (K-DUR,KLOR-CON) 20 MEQ tablet Take 1 tablet (20 mEq total) by mouth 2 (two) times daily. 14 tablet 0   No current facility-administered medications for this visit.     PHYSICAL EXAMINATION: ECOG PERFORMANCE STATUS: 1 - Symptomatic but completely ambulatory Blood pressure 136/60 Temperature 97.6 Weight 156.6 Heart rate 63 Respiration rate 18 GENERAL:alert, no distress and comfortable SKIN: skin color, texture, turgor are normal, no rashes or significant lesions EYES: normal, Conjunctiva are pink and non-injected, sclera clear OROPHARYNX:no exudate, no erythema and lips, buccal  mucosa, and tongue normal  NECK: supple, thyroid normal size, non-tender, without nodularity LYMPH:  no palpable lymphadenopathy in the cervical, axillary or inguinal LUNGS: clear to auscultation and percussion with normal breathing effort HEART: regular rate & rhythm and no murmurs and no lower extremity edema ABDOMEN:abdomen soft, non-tender and normal bowel sounds Musculoskeletal:no cyanosis of digits and no clubbing  NEURO: alert & oriented x 3 with fluent speech, no focal motor/sensory deficits  LABORATORY DATA:  I have reviewed the data as listed    Component Value Date/Time   NA 135 07/03/2017 0941   NA 140 01/09/2017 0913   K 4.5 07/03/2017 0941   K 3.7 01/09/2017 0913   CL 107 07/03/2017 0941    CL 107 06/29/2012 1131   CO2 23 07/03/2017 0941   CO2 21 (L) 01/09/2017 0913   GLUCOSE 110 (H) 07/03/2017 0941   GLUCOSE 80 01/09/2017 0913   GLUCOSE 143 (H) 06/29/2012 1131   BUN 20 07/03/2017 0941   BUN 9.8 01/09/2017 0913   CREATININE 2.06 (H) 07/03/2017 0941   CREATININE 1.5 (H) 01/09/2017 0913   CALCIUM 9.0 07/03/2017 0941   CALCIUM 7.5 (L) 01/09/2017 0913   PROT 6.9 07/03/2017 0941   PROT 5.7 (L) 01/09/2017 0913   ALBUMIN 3.6 07/03/2017 0941   ALBUMIN 2.9 (L) 01/09/2017 0913   AST 15 07/03/2017 0941   AST 12 01/09/2017 0913   ALT 16 07/03/2017 0941   ALT 10 01/09/2017 0913   ALKPHOS 63 07/03/2017 0941   ALKPHOS 55 01/09/2017 0913   BILITOT 0.4 07/03/2017 0941   BILITOT 0.44 01/09/2017 0913   GFRNONAA 30 (L) 07/03/2017 0941   GFRAA 35 (L) 07/03/2017 0941    No results found for: SPEP, UPEP  Lab Results  Component Value Date   WBC 5.4 07/03/2017   NEUTROABS 2.4 07/03/2017   HGB 9.9 (L) 07/03/2017   HCT 29.4 (L) 07/03/2017   MCV 98.9 (H) 07/03/2017   PLT 286 07/03/2017      Chemistry      Component Value Date/Time   NA 135 07/03/2017 0941   NA 140 01/09/2017 0913   K 4.5 07/03/2017 0941   K 3.7 01/09/2017 0913   CL 107 07/03/2017 0941   CL 107 06/29/2012 1131   CO2 23 07/03/2017 0941   CO2 21 (L) 01/09/2017 0913   BUN 20 07/03/2017 0941   BUN 9.8 01/09/2017 0913   CREATININE 2.06 (H) 07/03/2017 0941   CREATININE 1.5 (H) 01/09/2017 0913      Component Value Date/Time   CALCIUM 9.0 07/03/2017 0941   CALCIUM 7.5 (L) 01/09/2017 0913   ALKPHOS 63 07/03/2017 0941   ALKPHOS 55 01/09/2017 0913   AST 15 07/03/2017 0941   AST 12 01/09/2017 0913   ALT 16 07/03/2017 0941   ALT 10 01/09/2017 0913   BILITOT 0.4 07/03/2017 0941   BILITOT 0.44 01/09/2017 0913       All questions were answered. The patient knows to call the clinic with any problems, questions or concerns. No barriers to learning was detected.  I spent 15 minutes counseling the patient face  to face. The total time spent in the appointment was 20 minutes and more than 50% was on counseling and review of test results  Heath Lark, MD 07/03/2017 10:59 AM

## 2017-07-03 NOTE — Assessment & Plan Note (Signed)
He has multifactorial anemia, combination of anemia chronic kidney disease and recent blood loss/iron deficiency anemia His hemoglobin and iron studies are stable.  Continue close observation I plan to check iron studies on a regular basis and replace as needed 

## 2017-07-03 NOTE — Patient Instructions (Signed)
[facility] Discharge Instructions for Patients Receiving Chemotherapy  Today you received the following chemotherapy agents Darzalex.  To help prevent nausea and vomiting after your treatment, we encourage you to take your nausea medication as directed.  If you develop nausea and vomiting that is not controlled by your nausea medication, call the clinic.   BELOW ARE SYMPTOMS THAT SHOULD BE REPORTED IMMEDIATELY:  *FEVER GREATER THAN 100.5 F  *CHILLS WITH OR WITHOUT FEVER  NAUSEA AND VOMITING THAT IS NOT CONTROLLED WITH YOUR NAUSEA MEDICATION  *UNUSUAL SHORTNESS OF BREATH  *UNUSUAL BRUISING OR BLEEDING  TENDERNESS IN MOUTH AND THROAT WITH OR WITHOUT PRESENCE OF ULCERS  *URINARY PROBLEMS  *BOWEL PROBLEMS  UNUSUAL RASH Items with * indicate a potential emergency and should be followed up as soon as possible.  Feel free to call the clinic you have any questions or concerns. The clinic phone number is [phone].  Please show the CHEMO ALERT CARD at check-in to the Emergency Department and triage nurse.    

## 2017-07-03 NOTE — Assessment & Plan Note (Signed)
He appears to be tolerating Pomalyst with expected side-effects His recent myeloma panel showed fluctuated but overall stable His chronic renal failure is unrelated For now, he will continue Pomalyst on day 1-21, rest 7 days and daratumumab once a month He will continue acyclovir for antimicrobial prophylaxis and aspirin for DVT prophylaxis He will continue calcium with vitamin D supplement I will continue to see him once a month

## 2017-07-03 NOTE — Assessment & Plan Note (Signed)
He has recurrent, chronic low magnesium We will give him oral magnesium along with intravenous magnesium replacement therapy

## 2017-07-03 NOTE — Assessment & Plan Note (Signed)
He had recent acute on chronic renal failure secondary to poor oral intake, diarrhea and dehydration I recommend increase oral fluid intake as tolerated We will monitor his kidney function carefully. This is likely the cause of the elevated serum light chain recently

## 2017-07-03 NOTE — Progress Notes (Signed)
Completed on 07/03/2017

## 2017-07-04 LAB — KAPPA/LAMBDA LIGHT CHAINS
KAPPA, LAMDA LIGHT CHAIN RATIO: 17.89 — AB (ref 0.26–1.65)
Kappa free light chain: 223.6 mg/L — ABNORMAL HIGH (ref 3.3–19.4)
LAMDA FREE LIGHT CHAINS: 12.5 mg/L (ref 5.7–26.3)

## 2017-07-08 LAB — MULTIPLE MYELOMA PANEL, SERUM
ALBUMIN SERPL ELPH-MCNC: 3.3 g/dL (ref 2.9–4.4)
ALPHA 1: 0.2 g/dL (ref 0.0–0.4)
ALPHA2 GLOB SERPL ELPH-MCNC: 0.6 g/dL (ref 0.4–1.0)
Albumin/Glob SerPl: 1.2 (ref 0.7–1.7)
B-Globulin SerPl Elph-Mcnc: 0.8 g/dL (ref 0.7–1.3)
Gamma Glob SerPl Elph-Mcnc: 1.3 g/dL (ref 0.4–1.8)
Globulin, Total: 3 g/dL (ref 2.2–3.9)
IGG (IMMUNOGLOBIN G), SERUM: 1561 mg/dL (ref 700–1600)
IgA: 18 mg/dL — ABNORMAL LOW (ref 61–437)
IgM (Immunoglobulin M), Srm: 11 mg/dL — ABNORMAL LOW (ref 15–143)
M PROTEIN SERPL ELPH-MCNC: 1.1 g/dL — AB
TOTAL PROTEIN ELP: 6.3 g/dL (ref 6.0–8.5)

## 2017-07-14 ENCOUNTER — Other Ambulatory Visit: Payer: Self-pay | Admitting: *Deleted

## 2017-07-14 DIAGNOSIS — C9002 Multiple myeloma in relapse: Secondary | ICD-10-CM

## 2017-07-14 MED ORDER — POMALIDOMIDE 2 MG PO CAPS: 2.0000 mg | ORAL_CAPSULE | Freq: Every day | ORAL | 0 refills | Status: DC

## 2017-07-16 ENCOUNTER — Telehealth: Payer: Self-pay | Admitting: *Deleted

## 2017-07-16 NOTE — Telephone Encounter (Signed)
Faxed [facility] to [place]; release [phone]

## 2017-07-25 ENCOUNTER — Telehealth: Payer: Self-pay | Admitting: *Deleted

## 2017-07-25 NOTE — Telephone Encounter (Signed)
Faxed [facility] to [facility] - att: Kendrick Fries; release [phone]

## 2017-07-31 ENCOUNTER — Inpatient Hospital Stay: Payer: Medicare Other | Attending: Hematology and Oncology

## 2017-07-31 ENCOUNTER — Inpatient Hospital Stay (HOSPITAL_BASED_OUTPATIENT_CLINIC_OR_DEPARTMENT_OTHER): Payer: Medicare Other | Admitting: Hematology and Oncology

## 2017-07-31 ENCOUNTER — Inpatient Hospital Stay: Payer: Medicare Other

## 2017-07-31 ENCOUNTER — Telehealth: Payer: Self-pay | Admitting: Hematology and Oncology

## 2017-07-31 VITALS — BP 118/58 | HR 52 | Temp 97.5°F | Resp 18

## 2017-07-31 DIAGNOSIS — Z79899 Other long term (current) drug therapy: Secondary | ICD-10-CM | POA: Diagnosis not present

## 2017-07-31 DIAGNOSIS — N183 Chronic kidney disease, stage 3 unspecified: Secondary | ICD-10-CM

## 2017-07-31 DIAGNOSIS — C9002 Multiple myeloma in relapse: Secondary | ICD-10-CM

## 2017-07-31 DIAGNOSIS — E876 Hypokalemia: Secondary | ICD-10-CM

## 2017-07-31 DIAGNOSIS — D631 Anemia in chronic kidney disease: Secondary | ICD-10-CM

## 2017-07-31 DIAGNOSIS — Z5112 Encounter for antineoplastic immunotherapy: Secondary | ICD-10-CM | POA: Insufficient documentation

## 2017-07-31 DIAGNOSIS — D539 Nutritional anemia, unspecified: Secondary | ICD-10-CM | POA: Insufficient documentation

## 2017-07-31 DIAGNOSIS — Z95828 Presence of other vascular implants and grafts: Secondary | ICD-10-CM

## 2017-07-31 DIAGNOSIS — Z7982 Long term (current) use of aspirin: Secondary | ICD-10-CM | POA: Diagnosis not present

## 2017-07-31 LAB — COMPREHENSIVE METABOLIC PANEL
ALT: 12 U/L (ref 0–44)
ANION GAP: 8 (ref 5–15)
AST: 13 U/L — AB (ref 15–41)
Albumin: 3.5 g/dL (ref 3.5–5.0)
Alkaline Phosphatase: 74 U/L (ref 38–126)
BILIRUBIN TOTAL: 0.3 mg/dL (ref 0.3–1.2)
BUN: 17 mg/dL (ref 8–23)
CHLORIDE: 108 mmol/L (ref 98–111)
CO2: 21 mmol/L — ABNORMAL LOW (ref 22–32)
Calcium: 9.4 mg/dL (ref 8.9–10.3)
Creatinine, Ser: 1.85 mg/dL — ABNORMAL HIGH (ref 0.61–1.24)
GFR calc Af Amer: 40 mL/min — ABNORMAL LOW (ref >60)
GFR calc non Af Amer: 34 mL/min — ABNORMAL LOW (ref >60)
Glucose, Bld: 75 mg/dL (ref 70–99)
POTASSIUM: 4 mmol/L (ref 3.5–5.1)
Sodium: 137 mmol/L (ref 135–145)
TOTAL PROTEIN: 7.2 g/dL (ref 6.5–8.1)

## 2017-07-31 LAB — CBC WITH DIFFERENTIAL/PLATELET
BASOS ABS: 0 10*3/uL (ref 0.0–0.1)
BASOS PCT: 0 %
EOS PCT: 3 %
Eosinophils Absolute: 0.2 10*3/uL (ref 0.0–0.5)
HEMATOCRIT: 28.6 % — AB (ref 38.4–49.9)
Hemoglobin: 9.7 g/dL — ABNORMAL LOW (ref 13.0–17.1)
Lymphocytes Relative: 43 %
Lymphs Abs: 3.8 10*3/uL — ABNORMAL HIGH (ref 0.9–3.3)
MCH: 31.8 pg (ref 27.2–33.4)
MCHC: 33.9 g/dL (ref 32.0–36.0)
MCV: 93.8 fL (ref 79.3–98.0)
MONO ABS: 0.7 10*3/uL (ref 0.1–0.9)
MONOS PCT: 7 %
Neutro Abs: 4.2 10*3/uL (ref 1.5–6.5)
Neutrophils Relative %: 47 %
PLATELETS: 322 10*3/uL (ref 140–400)
RBC: 3.05 MIL/uL — ABNORMAL LOW (ref 4.20–5.82)
RDW: 15.3 % — AB (ref 11.0–14.6)
WBC: 8.9 10*3/uL (ref 4.0–10.3)

## 2017-07-31 LAB — FERRITIN: FERRITIN: 351 ng/mL — AB (ref 24–336)

## 2017-07-31 LAB — IRON AND TIBC
IRON: 50 ug/dL (ref 42–163)
Saturation Ratios: 22 % — ABNORMAL LOW (ref 42–163)
TIBC: 226 ug/dL (ref 202–409)
UIBC: 177 ug/dL

## 2017-07-31 LAB — MAGNESIUM: Magnesium: 1.6 mg/dL — ABNORMAL LOW (ref 1.7–2.4)

## 2017-07-31 MED ORDER — METHYLPREDNISOLONE SODIUM SUCC 40 MG IJ SOLR
40.0000 mg | Freq: Once | INTRAMUSCULAR | Status: AC
  Administered 2017-07-31: 40 mg via INTRAVENOUS

## 2017-07-31 MED ORDER — PROCHLORPERAZINE MALEATE 10 MG PO TABS
ORAL_TABLET | ORAL | Status: AC
  Filled 2017-07-31: qty 1

## 2017-07-31 MED ORDER — ACETAMINOPHEN 325 MG PO TABS
650.0000 mg | ORAL_TABLET | Freq: Once | ORAL | Status: AC
  Administered 2017-07-31: 650 mg via ORAL

## 2017-07-31 MED ORDER — SODIUM CHLORIDE 0.9 % IV SOLN
16.8000 mg/kg | Freq: Once | INTRAVENOUS | Status: AC
  Administered 2017-07-31: 1100 mg via INTRAVENOUS
  Filled 2017-07-31: qty 40

## 2017-07-31 MED ORDER — PROCHLORPERAZINE MALEATE 10 MG PO TABS
10.0000 mg | ORAL_TABLET | Freq: Once | ORAL | Status: AC
  Administered 2017-07-31: 10 mg via ORAL

## 2017-07-31 MED ORDER — SODIUM CHLORIDE 0.9 % IV SOLN
510.0000 mg | Freq: Once | INTRAVENOUS | Status: AC
  Administered 2017-07-31: 510 mg via INTRAVENOUS
  Filled 2017-07-31: qty 17

## 2017-07-31 MED ORDER — ACETAMINOPHEN 325 MG PO TABS
ORAL_TABLET | ORAL | Status: AC
  Filled 2017-07-31: qty 2

## 2017-07-31 MED ORDER — SODIUM CHLORIDE 0.9 % IV SOLN
Freq: Once | INTRAVENOUS | Status: AC
  Administered 2017-07-31: 12:00:00 via INTRAVENOUS
  Filled 2017-07-31: qty 250

## 2017-07-31 MED ORDER — METHYLPREDNISOLONE SODIUM SUCC 40 MG IJ SOLR
INTRAMUSCULAR | Status: AC
  Filled 2017-07-31: qty 1

## 2017-07-31 MED ORDER — DIPHENHYDRAMINE HCL 25 MG PO CAPS
ORAL_CAPSULE | ORAL | Status: AC
  Filled 2017-07-31: qty 2

## 2017-07-31 MED ORDER — DIPHENHYDRAMINE HCL 25 MG PO CAPS
50.0000 mg | ORAL_CAPSULE | Freq: Once | ORAL | Status: AC
  Administered 2017-07-31: 50 mg via ORAL

## 2017-07-31 MED ORDER — SODIUM CHLORIDE 0.9% FLUSH
10.0000 mL | INTRAVENOUS | Status: DC | PRN
  Administered 2017-07-31: 10 mL
  Filled 2017-07-31: qty 10

## 2017-07-31 MED ORDER — HEPARIN SOD (PORK) LOCK FLUSH 100 UNIT/ML IV SOLN
500.0000 [IU] | Freq: Once | INTRAVENOUS | Status: AC | PRN
  Administered 2017-07-31: 500 [IU]
  Filled 2017-07-31: qty 5

## 2017-07-31 MED ORDER — SODIUM CHLORIDE 0.9% FLUSH
10.0000 mL | Freq: Once | INTRAVENOUS | Status: AC
  Administered 2017-07-31: 10 mL
  Filled 2017-07-31: qty 10

## 2017-07-31 NOTE — Patient Instructions (Addendum)
[facility] Discharge Instructions for Patients Receiving Chemotherapy  Today you received the following chemotherapy agents Darzalex  To help prevent nausea and vomiting after your treatment, we encourage you to take your nausea medication as directed.    If you develop nausea and vomiting that is not controlled by your nausea medication, call the clinic.   BELOW ARE SYMPTOMS THAT SHOULD BE REPORTED IMMEDIATELY:  *FEVER GREATER THAN 100.5 F  *CHILLS WITH OR WITHOUT FEVER  NAUSEA AND VOMITING THAT IS NOT CONTROLLED WITH YOUR NAUSEA MEDICATION  *UNUSUAL SHORTNESS OF BREATH  *UNUSUAL BRUISING OR BLEEDING  TENDERNESS IN MOUTH AND THROAT WITH OR WITHOUT PRESENCE OF ULCERS  *URINARY PROBLEMS  *BOWEL PROBLEMS  UNUSUAL RASH Items with * indicate a potential emergency and should be followed up as soon as possible.  Feel free to call the clinic should you have any questions or concerns. The clinic phone number is [phone].  Please show the [facility] at check-in to the Emergency Department and triage nurse.  Ferumoxytol injection What is this medicine? FERUMOXYTOL is an iron complex. Iron is used to make healthy red blood cells, which carry oxygen and nutrients throughout the body. This medicine is used to treat iron deficiency anemia in people with chronic kidney disease. This medicine may be used for other purposes; ask your health care provider or pharmacist if you have questions. COMMON BRAND NAME(S): Feraheme What should I tell my health care provider before I take this medicine? They need to know if you have any of these conditions: -anemia not caused by low iron levels -high levels of iron in the blood -magnetic resonance imaging (MRI) test scheduled -an unusual or allergic reaction to iron, other medicines, foods, dyes, or preservatives -pregnant or trying to get pregnant -breast-feeding How should I use this medicine? This medicine is for  injection into a vein. It is given by a health care professional in a hospital or clinic setting. Talk to your pediatrician regarding the use of this medicine in children. Special care may be needed. Overdosage: If you think you have taken too much of this medicine contact a poison control center or emergency room at once. NOTE: This medicine is only for you. Do not share this medicine with others. What if I miss a dose? It is important not to miss your dose. Call your doctor or health care professional if you are unable to keep an appointment. What may interact with this medicine? This medicine may interact with the following medications: -other iron products This list may not describe all possible interactions. Give your health care provider a list of all the medicines, herbs, non-prescription drugs, or dietary supplements you use. Also tell them if you smoke, drink alcohol, or use illegal drugs. Some items may interact with your medicine. What should I watch for while using this medicine? Visit your doctor or healthcare professional regularly. Tell your doctor or healthcare professional if your symptoms do not start to get better or if they get worse. You may need blood work done while you are taking this medicine. You may need to follow a special diet. Talk to your doctor. Foods that contain iron include: whole grains/cereals, dried fruits, beans, or peas, leafy green vegetables, and organ meats (liver, kidney). What side effects may I notice from receiving this medicine? Side effects that you should report to your doctor or health care professional as soon as possible: -allergic reactions like skin rash, itching or hives, swelling of the  face, lips, or tongue -breathing problems -changes in blood pressure -feeling faint or lightheaded, falls -fever or chills -flushing, sweating, or hot feelings -swelling of the ankles or feet Side effects that usually do not require medical attention  (report to your doctor or health care professional if they continue or are bothersome): -diarrhea -headache -nausea, vomiting -stomach pain This list may not describe all possible side effects. Call your doctor for medical advice about side effects. You may report side effects to FDA at 1-800-FDA-1088. Where should I keep my medicine? This drug is given in a hospital or clinic and will not be stored at home. NOTE: This sheet is a summary. It may not cover all possible information. If you have questions about this medicine, talk to your doctor, pharmacist, or health care provider.  2018 Elsevier/Gold Standard (2015-01-26 12:41:49)

## 2017-07-31 NOTE — Telephone Encounter (Signed)
Per 7/25 los, no active orders or requests.

## 2017-08-01 ENCOUNTER — Encounter: Payer: Self-pay | Admitting: Hematology and Oncology

## 2017-08-01 LAB — KAPPA/LAMBDA LIGHT CHAINS
KAPPA FREE LGHT CHN: 213.7 mg/L — AB (ref 3.3–19.4)
KAPPA, LAMDA LIGHT CHAIN RATIO: 18.75 — AB (ref 0.26–1.65)
LAMDA FREE LIGHT CHAINS: 11.4 mg/L (ref 5.7–26.3)

## 2017-08-01 NOTE — Assessment & Plan Note (Signed)
He has multifactorial anemia, combination of anemia chronic kidney disease and recent blood loss/iron deficiency anemia His hemoglobin and iron studies are stable.  Continue close observation I plan to check iron studies on a regular basis and replace as needed 

## 2017-08-01 NOTE — Assessment & Plan Note (Signed)
I recommend increase oral fluid intake as tolerated We will monitor his kidney function carefully. This is likely the cause of the elevated serum light chain recently

## 2017-08-01 NOTE — Assessment & Plan Note (Signed)
He has recurrent, chronic low magnesium We will continue aggressive magnesium replacement therapy. 

## 2017-08-01 NOTE — Progress Notes (Signed)
[facility] OFFICE PROGRESS NOTE  Patient Care Team: Lucianne Lei, MD as PCP - General (Family Medicine) Heath Lark, MD as Consulting Physician (Hematology and Oncology) Pleasant, [name], RN as [facility] Beverely Pace, LCSW as Veterinary surgeon)  ASSESSMENT & PLAN:  Multiple myeloma in relapse [facility]) He appears to be tolerating Pomalyst with expected side-effects His recent myeloma panel showed fluctuated but overall stable His chronic renal failure is unrelated For now, he will continue Pomalyst on day 1-21, rest 7 days and daratumumab once a month He will continue acyclovir for antimicrobial prophylaxis and aspirin for DVT prophylaxis He will continue calcium with vitamin D supplement I will continue to see him once a month  Chronic renal insufficiency, stage III (moderate) I recommend increase oral fluid intake as tolerated We will monitor his kidney function carefully. This is likely the cause of the elevated serum light chain recently  Deficiency anemia He has multifactorial anemia, combination of anemia chronic kidney disease and recent blood loss/iron deficiency anemia His hemoglobin and iron studies are stable.  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   Orders Placed This Encounter  Procedures  . Iron and TIBC    Standing Status:   Standing    Number of Occurrences:   9    Standing Expiration Date:   08/02/2018  . Ferritin    Standing Status:   Standing    Number of Occurrences:   9    Standing Expiration Date:   08/02/2018    INTERVAL HISTORY: Please see below for problem oriented charting. He returns for chemotherapy and follow-up He tolerated treatment well Denies recent diarrhea or skin rash No recent infection, fever or  chills Denies new bone pain. The patient denies any recent signs or symptoms of bleeding such as spontaneous epistaxis, hematuria or hematochezia.   SUMMARY OF ONCOLOGIC HISTORY:   Multiple myeloma in relapse ([place])   12/12/2010 Initial Diagnosis    Multiple myeloma      02/16/2014 Imaging    Skeletal survey showed diffuse osteopenia      03/02/2014 Bone Marrow Biopsy    Accession: [id number] BM biopsy showed only 5 % plasma cells. However, the biopsy was difficult and the bone was fragmented during the procedure. Cytogenetics and FISH study is normal      03/03/2014 Procedure    he has port placement      03/10/2014 - 05/19/2014 Chemotherapy    he received elotuzumab and revlimid      05/20/2014 - 02/25/2016 Chemotherapy    Treatment is switched to maintenance Revlimid only. Treatment is stopped due to progressive disease      11/15/2014 Adverse Reaction    He has mild worsening anemia. Does of Revlimid reduced to 5 mg 21 days on, 7 days off      03/06/2016 -  Chemotherapy    He received Daratumumab and Velcade. Velcade is stopped on 07/24/16      08/14/2016 Miscellaneous    Pomalyst is added along with Daratumumab      10/17/2016 Surgery    EGD was performed for coffee-ground emesis. Multiple linear superficial ulcers were noted in the esophagus from 25-35 cm from insertion. Severe esophagitis was noted from 30-35 cm from insertion. Multiple biopsies were obtained. A small hiatal hernia was noted. The gastric cavity  contained coffee-ground fluid, after suctioning and lavage, no obvious gastric lesions/erosion/ulceration/erythema was noted. Biopsies were taken from the antrum to rule out H. Pylori. Duodenum bulb appeared unremarkable. A small diverticulum was noted in second portion of the duodenum.        12/06/2016 Imaging    Ct scan abdomen No acute findings.  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  Stable small hiatal  hernia.  Colonic diverticulosis, without radiographic evidence of diverticulitis.       REVIEW OF SYSTEMS:   Constitutional: Denies fevers, chills or abnormal weight loss Eyes: Denies blurriness of vision Ears, nose, mouth, throat, and face: Denies mucositis or sore throat Respiratory: Denies cough, dyspnea or wheezes Cardiovascular: Denies palpitation, chest discomfort or lower extremity swelling Gastrointestinal:  Denies nausea, heartburn or change in bowel habits Skin: Denies abnormal skin rashes Lymphatics: Denies new lymphadenopathy or easy bruising Neurological:Denies numbness, tingling or new weaknesses Behavioral/Psych: Mood is stable, no new changes  All other systems were reviewed with the patient and are negative.  I have reviewed the past medical history, past surgical history, social history and family history with the patient and they are unchanged from previous note.  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  MEDICATIONS:  Current Outpatient Medications  Medication Sig Dispense Refill  . acyclovir (ZOVIRAX) 400 MG tablet Take 1 tablet (400 mg total) by mouth daily. 60 tablet 11  . allopurinol (ZYLOPRIM) 100 MG tablet Take 100 mg by mouth 2 (two) times daily.     Marland Kitchen amLODipine (NORVASC) 10 MG tablet Take 10 mg by mouth daily.  1  . aspirin EC 81 MG tablet Take 81 mg by mouth daily.    Marland Kitchen atorvastatin (LIPITOR) 20 MG tablet Take 20 mg by mouth at bedtime.     . brimonidine (ALPHAGAN) 0.15 % ophthalmic solution Place 1 drop into the right eye 3 (three) times daily.    . cholecalciferol (VITAMIN D) 1000 UNITS tablet Take 1,000 Units by mouth daily.    . dorzolamide-timolol (COSOPT) 22.3-6.8 MG/ML ophthalmic solution Place 1 drop into both eyes 2 (two) times daily.     . fluorometholone (FML) 0.1 % ophthalmic suspension Place 1 drop into both eyes daily.    Marland Kitchen latanoprost (XALATAN) 0.005 % ophthalmic solution Place 1 drop into the right eye at bedtime.    .  lidocaine-prilocaine (EMLA) cream Apply to affected area once 30 g 3  . loperamide (IMODIUM A-D) 2 MG tablet 1 po after each watery BM.  Maximum 6 per 24hrs. 30 tablet PRN  . magnesium oxide (MAG-OX) 400 (241.3 Mg) MG tablet Take 1 tablet (400 mg total) by mouth 2 (two) times daily. 60 tablet 9  . ondansetron (ZOFRAN) 8 MG tablet Take 1 tablet (8 mg total) by mouth 2 (two) times daily as needed (Nausea or vomiting). 30 tablet 1  . pantoprazole (PROTONIX) 40 MG tablet Take 1 tablet (40 mg total) by mouth 2 (two) times daily before a meal. 60 tablet 9  . pomalidomide (POMALYST) 2 MG capsule Take 1 capsule (2 mg total) by mouth daily. Take with water on days 1-21. Repeat every 28 days. 21 capsule 0  . potassium chloride SA (K-DUR,KLOR-CON) 20 MEQ tablet Take 1 tablet (20 mEq total) by mouth 2 (two) times daily. 14 tablet 0   No current facility-administered medications for this visit.     PHYSICAL EXAMINATION: ECOG PERFORMANCE STATUS: 1 - Symptomatic but completely ambulatory  Vitals:   07/31/17 1114  BP: (!) 151/63  Pulse: (!) 56  Resp: 18  Temp: (!) 97.5 F (36.4 C)  SpO2: 100%   Filed Weights   07/31/17 1114  Weight: 156 lb (70.8 kg)    GENERAL:alert, no distress and comfortable SKIN: skin color, texture, turgor are normal, no rashes or significant lesions EYES: normal, Conjunctiva are pink and non-injected, sclera clear OROPHARYNX:no exudate, no erythema and lips, buccal mucosa, and tongue normal  NECK: supple, thyroid normal size, non-tender, without nodularity LYMPH:  no palpable lymphadenopathy in the cervical, axillary or inguinal LUNGS: clear to auscultation and percussion with normal breathing effort HEART: regular rate & rhythm and no murmurs and no lower extremity edema ABDOMEN:abdomen soft, non-tender and normal bowel sounds Musculoskeletal:no cyanosis of digits and no clubbing  NEURO: alert & oriented x 3 with fluent speech, no focal motor/sensory  deficits  LABORATORY DATA:  I have reviewed the data as listed    Component Value Date/Time   NA 137 07/31/2017 0859   NA 140 01/09/2017 0913   K 4.0 07/31/2017 0859   K 3.7 01/09/2017 0913   CL 108 07/31/2017 0859   CL 107 06/29/2012 1131   CO2 21 (L) 07/31/2017 0859   CO2 21 (L) 01/09/2017 0913   GLUCOSE 75 07/31/2017 0859   GLUCOSE 80 01/09/2017 0913   GLUCOSE 143 (H) 06/29/2012 1131   BUN 17 07/31/2017 0859   BUN 9.8 01/09/2017 0913   CREATININE 1.85 (H) 07/31/2017 0859   CREATININE 1.5 (H) 01/09/2017 0913   CALCIUM 9.4 07/31/2017 0859   CALCIUM 7.5 (L) 01/09/2017 0913   PROT 7.2 07/31/2017 0859   PROT 5.7 (L) 01/09/2017 0913   ALBUMIN 3.5 07/31/2017 0859   ALBUMIN 2.9 (L) 01/09/2017 0913   AST 13 (L) 07/31/2017 0859   AST 12 01/09/2017 0913   ALT 12 07/31/2017 0859   ALT 10 01/09/2017 0913   ALKPHOS 74 07/31/2017 0859   ALKPHOS 55 01/09/2017 0913   BILITOT 0.3 07/31/2017 0859   BILITOT 0.44 01/09/2017 0913   GFRNONAA 34 (L) 07/31/2017 0859   GFRAA 40 (L) 07/31/2017 0859    No results found for: SPEP, UPEP  Lab Results  Component Value Date   WBC 8.9 07/31/2017   NEUTROABS 4.2 07/31/2017   HGB 9.7 (L) 07/31/2017   HCT 28.6 (L) 07/31/2017   MCV 93.8 07/31/2017   PLT 322 07/31/2017      Chemistry      Component Value Date/Time   NA 137 07/31/2017 0859   NA 140 01/09/2017 0913   K 4.0 07/31/2017 0859   K 3.7 01/09/2017 0913   CL 108 07/31/2017 0859   CL 107 06/29/2012 1131   CO2 21 (L) 07/31/2017 0859   CO2 21 (L) 01/09/2017 0913   BUN 17 07/31/2017 0859   BUN 9.8 01/09/2017 0913   CREATININE 1.85 (H) 07/31/2017 0859   CREATININE 1.5 (H) 01/09/2017 0913      Component Value Date/Time   CALCIUM 9.4 07/31/2017 0859   CALCIUM 7.5 (L) 01/09/2017 0913   ALKPHOS 74 07/31/2017 0859   ALKPHOS 55 01/09/2017 0913   AST 13 (L) 07/31/2017 0859   AST 12 01/09/2017 0913   ALT 12 07/31/2017 0859   ALT 10 01/09/2017 0913   BILITOT 0.3 07/31/2017 0859    BILITOT 0.44 01/09/2017 0913       All questions were answered. The patient knows to call the clinic with any problems, questions or concerns. No barriers to learning was detected.  I  spent 15 minutes counseling the patient face to face. The total time spent in the appointment was 20 minutes and more than 50% was on counseling and review of test results  Heath Lark, MD 08/01/2017 8:30 AM

## 2017-08-01 NOTE — Assessment & Plan Note (Signed)
He appears to be tolerating Pomalyst with expected side-effects His recent myeloma panel showed fluctuated but overall stable His chronic renal failure is unrelated For now, he will continue Pomalyst on day 1-21, rest 7 days and daratumumab once a month He will continue acyclovir for antimicrobial prophylaxis and aspirin for DVT prophylaxis He will continue calcium with vitamin D supplement I will continue to see him once a month

## 2017-08-04 ENCOUNTER — Other Ambulatory Visit: Payer: Self-pay

## 2017-08-04 DIAGNOSIS — C9002 Multiple myeloma in relapse: Secondary | ICD-10-CM

## 2017-08-04 LAB — MULTIPLE MYELOMA PANEL, SERUM
ALBUMIN SERPL ELPH-MCNC: 3.6 g/dL (ref 2.9–4.4)
ALPHA 1: 0.3 g/dL (ref 0.0–0.4)
ALPHA2 GLOB SERPL ELPH-MCNC: 0.8 g/dL (ref 0.4–1.0)
Albumin/Glob SerPl: 1.1 (ref 0.7–1.7)
B-GLOBULIN SERPL ELPH-MCNC: 0.8 g/dL (ref 0.7–1.3)
GAMMA GLOB SERPL ELPH-MCNC: 1.5 g/dL (ref 0.4–1.8)
GLOBULIN, TOTAL: 3.3 g/dL (ref 2.2–3.9)
IGG (IMMUNOGLOBIN G), SERUM: 1761 mg/dL — AB (ref 700–1600)
IgA: 21 mg/dL — ABNORMAL LOW (ref 61–437)
IgM (Immunoglobulin M), Srm: 7 mg/dL — ABNORMAL LOW (ref 15–143)
M PROTEIN SERPL ELPH-MCNC: 1.3 g/dL — AB
Total Protein ELP: 6.9 g/dL (ref 6.0–8.5)

## 2017-08-04 MED ORDER — POMALIDOMIDE 2 MG PO CAPS
2.0000 mg | ORAL_CAPSULE | Freq: Every day | ORAL | 0 refills | Status: DC
Start: 2017-08-04 — End: 2017-09-09

## 2017-08-09 ENCOUNTER — Other Ambulatory Visit: Payer: Self-pay | Admitting: Hematology and Oncology

## 2017-08-28 ENCOUNTER — Inpatient Hospital Stay: Payer: Medicare Other

## 2017-08-28 ENCOUNTER — Inpatient Hospital Stay: Payer: Medicare Other | Attending: Hematology and Oncology

## 2017-08-28 ENCOUNTER — Inpatient Hospital Stay (HOSPITAL_BASED_OUTPATIENT_CLINIC_OR_DEPARTMENT_OTHER): Payer: Medicare Other | Admitting: Hematology and Oncology

## 2017-08-28 ENCOUNTER — Telehealth: Payer: Self-pay | Admitting: Hematology and Oncology

## 2017-08-28 ENCOUNTER — Encounter: Payer: Self-pay | Admitting: Hematology and Oncology

## 2017-08-28 VITALS — BP 129/65 | HR 50 | Temp 98.9°F | Resp 16

## 2017-08-28 DIAGNOSIS — Z7982 Long term (current) use of aspirin: Secondary | ICD-10-CM

## 2017-08-28 DIAGNOSIS — Z95828 Presence of other vascular implants and grafts: Secondary | ICD-10-CM

## 2017-08-28 DIAGNOSIS — M8580 Other specified disorders of bone density and structure, unspecified site: Secondary | ICD-10-CM | POA: Insufficient documentation

## 2017-08-28 DIAGNOSIS — N183 Chronic kidney disease, stage 3 unspecified: Secondary | ICD-10-CM

## 2017-08-28 DIAGNOSIS — C9002 Multiple myeloma in relapse: Secondary | ICD-10-CM

## 2017-08-28 DIAGNOSIS — D539 Nutritional anemia, unspecified: Secondary | ICD-10-CM

## 2017-08-28 DIAGNOSIS — D500 Iron deficiency anemia secondary to blood loss (chronic): Secondary | ICD-10-CM | POA: Diagnosis not present

## 2017-08-28 DIAGNOSIS — Z79899 Other long term (current) drug therapy: Secondary | ICD-10-CM

## 2017-08-28 DIAGNOSIS — Z5112 Encounter for antineoplastic immunotherapy: Secondary | ICD-10-CM | POA: Insufficient documentation

## 2017-08-28 DIAGNOSIS — D631 Anemia in chronic kidney disease: Secondary | ICD-10-CM | POA: Diagnosis not present

## 2017-08-28 DIAGNOSIS — Z9221 Personal history of antineoplastic chemotherapy: Secondary | ICD-10-CM | POA: Insufficient documentation

## 2017-08-28 DIAGNOSIS — E876 Hypokalemia: Secondary | ICD-10-CM

## 2017-08-28 LAB — CBC WITH DIFFERENTIAL/PLATELET
BASOS PCT: 2 %
Basophils Absolute: 0.1 10*3/uL (ref 0.0–0.1)
EOS ABS: 0.2 10*3/uL (ref 0.0–0.5)
Eosinophils Relative: 3 %
HEMATOCRIT: 28.3 % — AB (ref 38.4–49.9)
HEMOGLOBIN: 9.5 g/dL — AB (ref 13.0–17.1)
Lymphocytes Relative: 48 %
Lymphs Abs: 3.1 10*3/uL (ref 0.9–3.3)
MCH: 32.4 pg (ref 27.2–33.4)
MCHC: 33.6 g/dL (ref 32.0–36.0)
MCV: 96.6 fL (ref 79.3–98.0)
MONOS PCT: 6 %
Monocytes Absolute: 0.4 10*3/uL (ref 0.1–0.9)
NEUTROS ABS: 2.7 10*3/uL (ref 1.5–6.5)
Neutrophils Relative %: 41 %
Platelets: 208 10*3/uL (ref 140–400)
RBC: 2.93 MIL/uL — AB (ref 4.20–5.82)
RDW: 17.1 % — ABNORMAL HIGH (ref 11.0–14.6)
WBC: 6.4 10*3/uL (ref 4.0–10.3)

## 2017-08-28 LAB — COMPREHENSIVE METABOLIC PANEL
ALBUMIN: 3.3 g/dL — AB (ref 3.5–5.0)
ALK PHOS: 62 U/L (ref 38–126)
ALT: 19 U/L (ref 0–44)
AST: 16 U/L (ref 15–41)
Anion gap: 6 (ref 5–15)
BILIRUBIN TOTAL: 0.3 mg/dL (ref 0.3–1.2)
BUN: 18 mg/dL (ref 8–23)
CALCIUM: 8.7 mg/dL — AB (ref 8.9–10.3)
CO2: 23 mmol/L (ref 22–32)
CREATININE: 1.76 mg/dL — AB (ref 0.61–1.24)
Chloride: 110 mmol/L (ref 98–111)
GFR calc Af Amer: 42 mL/min — ABNORMAL LOW (ref >60)
GFR calc non Af Amer: 36 mL/min — ABNORMAL LOW (ref >60)
GLUCOSE: 105 mg/dL — AB (ref 70–99)
Potassium: 4.3 mmol/L (ref 3.5–5.1)
SODIUM: 139 mmol/L (ref 135–145)
TOTAL PROTEIN: 6.4 g/dL — AB (ref 6.5–8.1)

## 2017-08-28 LAB — FERRITIN: FERRITIN: 456 ng/mL — AB (ref 24–336)

## 2017-08-28 LAB — IRON AND TIBC
IRON: 84 ug/dL (ref 42–163)
SATURATION RATIOS: 36 % — AB (ref 42–163)
TIBC: 235 ug/dL (ref 202–409)
UIBC: 151 ug/dL

## 2017-08-28 LAB — MAGNESIUM: Magnesium: 1.4 mg/dL — CL (ref 1.7–2.4)

## 2017-08-28 MED ORDER — SODIUM CHLORIDE 0.9% FLUSH
10.0000 mL | INTRAVENOUS | Status: DC | PRN
  Administered 2017-08-28: 10 mL
  Filled 2017-08-28: qty 10

## 2017-08-28 MED ORDER — ACETAMINOPHEN 325 MG PO TABS
ORAL_TABLET | ORAL | Status: AC
  Filled 2017-08-28: qty 2

## 2017-08-28 MED ORDER — METHYLPREDNISOLONE SODIUM SUCC 40 MG IJ SOLR
INTRAMUSCULAR | Status: AC
  Filled 2017-08-28: qty 1

## 2017-08-28 MED ORDER — PROCHLORPERAZINE MALEATE 10 MG PO TABS
ORAL_TABLET | ORAL | Status: AC
  Filled 2017-08-28: qty 1

## 2017-08-28 MED ORDER — DIPHENHYDRAMINE HCL 25 MG PO CAPS
50.0000 mg | ORAL_CAPSULE | Freq: Once | ORAL | Status: AC
  Administered 2017-08-28: 50 mg via ORAL

## 2017-08-28 MED ORDER — MAGNESIUM SULFATE 50 % IJ SOLN: 2000.0000 mg | Freq: Once | INTRAVENOUS | Status: DC

## 2017-08-28 MED ORDER — SODIUM CHLORIDE 0.9 % IV SOLN: 2.0000 g | Freq: Once | INTRAVENOUS | Status: DC

## 2017-08-28 MED ORDER — SODIUM CHLORIDE 0.9% FLUSH
10.0000 mL | Freq: Once | INTRAVENOUS | Status: AC
  Administered 2017-08-28: 10 mL
  Filled 2017-08-28: qty 10

## 2017-08-28 MED ORDER — SODIUM CHLORIDE 0.9 % IV SOLN
Freq: Once | INTRAVENOUS | Status: AC
  Administered 2017-08-28: 12:00:00 via INTRAVENOUS
  Filled 2017-08-28: qty 250

## 2017-08-28 MED ORDER — DIPHENHYDRAMINE HCL 25 MG PO CAPS
ORAL_CAPSULE | ORAL | Status: AC
  Filled 2017-08-28: qty 2

## 2017-08-28 MED ORDER — PROCHLORPERAZINE MALEATE 10 MG PO TABS
10.0000 mg | ORAL_TABLET | Freq: Once | ORAL | Status: AC
  Administered 2017-08-28: 10 mg via ORAL

## 2017-08-28 MED ORDER — MAGNESIUM SULFATE 2 GM/50ML IV SOLN
2.0000 g | Freq: Once | INTRAVENOUS | Status: AC
  Administered 2017-08-28: 2 g via INTRAVENOUS
  Filled 2017-08-28: qty 50

## 2017-08-28 MED ORDER — SODIUM CHLORIDE 0.9 % IV SOLN
16.8000 mg/kg | Freq: Once | INTRAVENOUS | Status: AC
  Administered 2017-08-28: 1100 mg via INTRAVENOUS
  Filled 2017-08-28: qty 40

## 2017-08-28 MED ORDER — METHYLPREDNISOLONE SODIUM SUCC 40 MG IJ SOLR
40.0000 mg | Freq: Once | INTRAMUSCULAR | Status: AC
  Administered 2017-08-28: 40 mg via INTRAVENOUS

## 2017-08-28 MED ORDER — HEPARIN SOD (PORK) LOCK FLUSH 100 UNIT/ML IV SOLN
500.0000 [IU] | Freq: Once | INTRAVENOUS | Status: AC | PRN
  Administered 2017-08-28: 500 [IU]
  Filled 2017-08-28: qty 5

## 2017-08-28 MED ORDER — ACETAMINOPHEN 325 MG PO TABS
650.0000 mg | ORAL_TABLET | Freq: Once | ORAL | Status: AC
  Administered 2017-08-28: 650 mg via ORAL

## 2017-08-28 NOTE — Assessment & Plan Note (Signed)
He has multifactorial anemia, combination of anemia chronic kidney disease and recent blood loss/iron deficiency anemia His hemoglobin and iron studies are stable.  Continue close observation I plan to check iron studies on a regular basis and replace as needed 

## 2017-08-28 NOTE — Assessment & Plan Note (Signed)
I recommend increase oral fluid intake as tolerated We will monitor his kidney function carefully. 

## 2017-08-28 NOTE — Progress Notes (Signed)
[facility] OFFICE PROGRESS NOTE  Patient Care Team: Lucianne Lei, MD as PCP - General (Family Medicine) Heath Lark, MD as Consulting Physician (Hematology and Oncology) Pleasant, [name], RN as [place] [facility] Beverely Pace, LCSW as Veterinary surgeon)  ASSESSMENT & PLAN:  Multiple myeloma in relapse [facility]) I reviewed myeloma panel with the patient and family His IgG level is slightly trending up along with M protein but light chains were stable I will wait for myeloma panel from this week to come back and if result confirm worsening disease control, I might have to switch his treatment In the meantime, he will continue Pomalyst, monthly daratumumab for treatment He will continue acyclovir for antimicrobial prophylaxis along with calcium and vitamin D He will continue aspirin for DVT prophylaxis  Chronic renal insufficiency, stage III (moderate) I recommend increase oral fluid intake as tolerated We will monitor his kidney function carefully.  Anemia in chronic renal disease He has multifactorial anemia, combination of anemia chronic kidney disease and recent blood loss/iron deficiency anemia His hemoglobin and iron studies are stable.  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 We will give him IV magnesium as well.   No orders of the defined types were placed in this encounter.   INTERVAL HISTORY: Please see below for problem oriented charting. He returns for further follow-up He feels well No new bone pain Denies recent infection, fever or chills He has occasional diarrhea, stable The patient denies any recent signs or symptoms of bleeding such as spontaneous epistaxis, hematuria or hematochezia.  SUMMARY OF ONCOLOGIC HISTORY:   Multiple  myeloma in relapse ([place])   12/12/2010 Initial Diagnosis    Multiple myeloma    02/16/2014 Imaging    Skeletal survey showed diffuse osteopenia    03/02/2014 Bone Marrow Biopsy    Accession: [id number] BM biopsy showed only 5 % plasma cells. However, the biopsy was difficult and the bone was fragmented during the procedure. Cytogenetics and FISH study is normal    03/03/2014 Procedure    he has port placement    03/10/2014 - 05/19/2014 Chemotherapy    he received elotuzumab and revlimid    05/20/2014 - 02/25/2016 Chemotherapy    Treatment is switched to maintenance Revlimid only. Treatment is stopped due to progressive disease    11/15/2014 Adverse Reaction    He has mild worsening anemia. Does of Revlimid reduced to 5 mg 21 days on, 7 days off    03/06/2016 -  Chemotherapy    He received Daratumumab and Velcade. Velcade is stopped on 07/24/16    08/14/2016 Miscellaneous    Pomalyst is added along with Daratumumab    10/17/2016 Surgery    EGD was performed for coffee-ground emesis. Multiple linear superficial ulcers were noted in the esophagus from 25-35 cm from insertion. Severe esophagitis was noted from 30-35 cm from insertion. Multiple biopsies were obtained. A small hiatal hernia was noted. The gastric cavity contained coffee-ground fluid, after suctioning and lavage, no obvious gastric lesions/erosion/ulceration/erythema was noted. Biopsies were taken from the antrum to rule out H. Pylori. Duodenum bulb appeared unremarkable. A small diverticulum was noted in second portion of the duodenum.      12/06/2016 Imaging    Ct scan abdomen No acute findings.  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  Stable  small hiatal hernia.  Colonic diverticulosis, without radiographic evidence of diverticulitis.     REVIEW OF SYSTEMS:   Constitutional: Denies fevers, chills or abnormal weight loss Eyes: Denies blurriness of vision Ears, nose, mouth,  throat, and face: Denies mucositis or sore throat Respiratory: Denies cough, dyspnea or wheezes Cardiovascular: Denies palpitation, chest discomfort or lower extremity swelling Skin: Denies abnormal skin rashes Lymphatics: Denies new lymphadenopathy or easy bruising Neurological:Denies numbness, tingling or new weaknesses Behavioral/Psych: Mood is stable, no new changes  All other systems were reviewed with the patient and are negative.  I have reviewed the past medical history, past surgical history, social history and family history with the patient and they are unchanged from previous note.  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  MEDICATIONS:  Current Outpatient Medications  Medication Sig Dispense Refill  . acyclovir (ZOVIRAX) 400 MG tablet Take 1 tablet (400 mg total) by mouth daily. 60 tablet 11  . allopurinol (ZYLOPRIM) 100 MG tablet Take 100 mg by mouth 2 (two) times daily.     Marland Kitchen amLODipine (NORVASC) 10 MG tablet Take 10 mg by mouth daily.  1  . aspirin EC 81 MG tablet Take 81 mg by mouth daily.    Marland Kitchen atorvastatin (LIPITOR) 20 MG tablet Take 20 mg by mouth at bedtime.     . brimonidine (ALPHAGAN) 0.15 % ophthalmic solution Place 1 drop into the right eye 3 (three) times daily.    . cholecalciferol (VITAMIN D) 1000 UNITS tablet Take 1,000 Units by mouth daily.    . dorzolamide-timolol (COSOPT) 22.3-6.8 MG/ML ophthalmic solution Place 1 drop into both eyes 2 (two) times daily.     . fluorometholone (FML) 0.1 % ophthalmic suspension Place 1 drop into both eyes daily.    Marland Kitchen latanoprost (XALATAN) 0.005 % ophthalmic solution Place 1 drop into the right eye at bedtime.    . lidocaine-prilocaine (EMLA) cream Apply to affected area once 30 g 3  . loperamide (IMODIUM A-D) 2 MG tablet 1 po after each watery BM.  Maximum 6 per 24hrs. 30 tablet PRN  . magnesium oxide (MAG-OX) 400 (241.3 Mg) MG tablet Take 1 tablet (400 mg total) by mouth 2 (two) times daily. 60 tablet 9  .  ondansetron (ZOFRAN) 8 MG tablet Take 1 tablet (8 mg total) by mouth 2 (two) times daily as needed (Nausea or vomiting). 30 tablet 1  . pantoprazole (PROTONIX) 40 MG tablet Take 1 tablet (40 mg total) by mouth daily. 180 tablet 3  . pomalidomide (POMALYST) 2 MG capsule Take 1 capsule (2 mg total) by mouth daily. Take with water on days 1-21. Repeat every 28 days. 21 capsule 0  . potassium chloride SA (K-DUR,KLOR-CON) 20 MEQ tablet Take 1 tablet (20 mEq total) by mouth 2 (two) times daily. 14 tablet 0   No current facility-administered medications for this visit.    Facility-Administered Medications Ordered in Other Visits  Medication Dose Route Frequency Provider Last Rate Last Dose  . heparin lock flush 100 unit/mL  500 Units Intracatheter Once PRN Alvy Bimler, Areya Lemmerman, MD      . sodium chloride flush (NS) 0.9 % injection 10 mL  10 mL Intracatheter PRN Alvy Bimler, Mitch Arquette, MD        PHYSICAL EXAMINATION: ECOG PERFORMANCE STATUS: 1 - Symptomatic but completely ambulatory  Vitals:   08/28/17 1109  BP: (!) 127/57  Pulse: (!) 58  Resp: 17  Temp: (!) 97.5 F (36.4 C)  SpO2: 100%   Filed Weights  08/28/17 1109  Weight: 156 lb 11.2 oz (71.1 kg)    GENERAL:alert, no distress and comfortable SKIN: skin color, texture, turgor are normal, no rashes or significant lesions EYES: normal, Conjunctiva are pink and non-injected, sclera clear OROPHARYNX:no exudate, no erythema and lips, buccal mucosa, and tongue normal  NECK: supple, thyroid normal size, non-tender, without nodularity LYMPH:  no palpable lymphadenopathy in the cervical, axillary or inguinal LUNGS: clear to auscultation and percussion with normal breathing effort HEART: regular rate & rhythm and no murmurs and no lower extremity edema ABDOMEN:abdomen soft, non-tender and normal bowel sounds Musculoskeletal:no cyanosis of digits and no clubbing  NEURO: alert & oriented x 3 with fluent speech, no focal motor/sensory deficits  LABORATORY DATA:   I have reviewed the data as listed    Component Value Date/Time   NA 139 08/28/2017 1004   NA 140 01/09/2017 0913   K 4.3 08/28/2017 1004   K 3.7 01/09/2017 0913   CL 110 08/28/2017 1004   CL 107 06/29/2012 1131   CO2 23 08/28/2017 1004   CO2 21 (L) 01/09/2017 0913   GLUCOSE 105 (H) 08/28/2017 1004   GLUCOSE 80 01/09/2017 0913   GLUCOSE 143 (H) 06/29/2012 1131   BUN 18 08/28/2017 1004   BUN 9.8 01/09/2017 0913   CREATININE 1.76 (H) 08/28/2017 1004   CREATININE 1.5 (H) 01/09/2017 0913   CALCIUM 8.7 (L) 08/28/2017 1004   CALCIUM 7.5 (L) 01/09/2017 0913   PROT 6.4 (L) 08/28/2017 1004   PROT 5.7 (L) 01/09/2017 0913   ALBUMIN 3.3 (L) 08/28/2017 1004   ALBUMIN 2.9 (L) 01/09/2017 0913   AST 16 08/28/2017 1004   AST 12 01/09/2017 0913   ALT 19 08/28/2017 1004   ALT 10 01/09/2017 0913   ALKPHOS 62 08/28/2017 1004   ALKPHOS 55 01/09/2017 0913   BILITOT 0.3 08/28/2017 1004   BILITOT 0.44 01/09/2017 0913   GFRNONAA 36 (L) 08/28/2017 1004   GFRAA 42 (L) 08/28/2017 1004    No results found for: SPEP, UPEP  Lab Results  Component Value Date   WBC 6.4 08/28/2017   NEUTROABS 2.7 08/28/2017   HGB 9.5 (L) 08/28/2017   HCT 28.3 (L) 08/28/2017   MCV 96.6 08/28/2017   PLT 208 08/28/2017      Chemistry      Component Value Date/Time   NA 139 08/28/2017 1004   NA 140 01/09/2017 0913   K 4.3 08/28/2017 1004   K 3.7 01/09/2017 0913   CL 110 08/28/2017 1004   CL 107 06/29/2012 1131   CO2 23 08/28/2017 1004   CO2 21 (L) 01/09/2017 0913   BUN 18 08/28/2017 1004   BUN 9.8 01/09/2017 0913   CREATININE 1.76 (H) 08/28/2017 1004   CREATININE 1.5 (H) 01/09/2017 0913      Component Value Date/Time   CALCIUM 8.7 (L) 08/28/2017 1004   CALCIUM 7.5 (L) 01/09/2017 0913   ALKPHOS 62 08/28/2017 1004   ALKPHOS 55 01/09/2017 0913   AST 16 08/28/2017 1004   AST 12 01/09/2017 0913   ALT 19 08/28/2017 1004   ALT 10 01/09/2017 0913   BILITOT 0.3 08/28/2017 1004   BILITOT 0.44 01/09/2017  0913      All questions were answered. The patient knows to call the clinic with any problems, questions or concerns. No barriers to learning was detected.  I spent 15 minutes counseling the patient face to face. The total time spent in the appointment was 20 minutes and more than 50% was  on counseling and review of test results  Heath Lark, MD 08/28/2017 1:50 PM

## 2017-08-28 NOTE — Patient Instructions (Signed)
[facility] Discharge Instructions for Patients Receiving Chemotherapy  Today you received the following chemotherapy agents Darzalex  To help prevent nausea and vomiting after your treatment, we encourage you to take your nausea medication as directed.    If you develop nausea and vomiting that is not controlled by your nausea medication, call the clinic.   BELOW ARE SYMPTOMS THAT SHOULD BE REPORTED IMMEDIATELY:  *FEVER GREATER THAN 100.5 F  *CHILLS WITH OR WITHOUT FEVER  NAUSEA AND VOMITING THAT IS NOT CONTROLLED WITH YOUR NAUSEA MEDICATION  *UNUSUAL SHORTNESS OF BREATH  *UNUSUAL BRUISING OR BLEEDING  TENDERNESS IN MOUTH AND THROAT WITH OR WITHOUT PRESENCE OF ULCERS  *URINARY PROBLEMS  *BOWEL PROBLEMS  UNUSUAL RASH Items with * indicate a potential emergency and should be followed up as soon as possible.  Feel free to call the clinic should you have any questions or concerns. The clinic phone number is [phone].  Please show the [place] at check-in to the Emergency Department and triage nurse.  Ferumoxytol injection What is this medicine? FERUMOXYTOL is an iron complex. Iron is used to make healthy red blood cells, which carry oxygen and nutrients throughout the body. This medicine is used to treat iron deficiency anemia in people with chronic kidney disease. This medicine may be used for other purposes; ask your health care provider or pharmacist if you have questions. COMMON BRAND NAME(S): Feraheme What should I tell my health care provider before I take this medicine? They need to know if you have any of these conditions: -anemia not caused by low iron levels -high levels of iron in the blood -magnetic resonance imaging (MRI) test scheduled -an unusual or allergic reaction to iron, other medicines, foods, dyes, or preservatives -pregnant or trying to get pregnant -breast-feeding How should I use this medicine? This medicine is for  injection into a vein. It is given by a health care professional in a hospital or clinic setting. Talk to your pediatrician regarding the use of this medicine in children. Special care may be needed. Overdosage: If you think you have taken too much of this medicine contact a poison control center or emergency room at once. NOTE: This medicine is only for you. Do not share this medicine with others. What if I miss a dose? It is important not to miss your dose. Call your doctor or health care professional if you are unable to keep an appointment. What may interact with this medicine? This medicine may interact with the following medications: -other iron products This list may not describe all possible interactions. Give your health care provider a list of all the medicines, herbs, non-prescription drugs, or dietary supplements you use. Also tell them if you smoke, drink alcohol, or use illegal drugs. Some items may interact with your medicine. What should I watch for while using this medicine? Visit your doctor or healthcare professional regularly. Tell your doctor or healthcare professional if your symptoms do not start to get better or if they get worse. You may need blood work done while you are taking this medicine. You may need to follow a special diet. Talk to your doctor. Foods that contain iron include: whole grains/cereals, dried fruits, beans, or peas, leafy green vegetables, and organ meats (liver, kidney). What side effects may I notice from receiving this medicine? Side effects that you should report to your doctor or health care professional as soon as possible: -allergic reactions like skin rash, itching or hives, swelling of the  face, lips, or tongue -breathing problems -changes in blood pressure -feeling faint or lightheaded, falls -fever or chills -flushing, sweating, or hot feelings -swelling of the ankles or feet Side effects that usually do not require medical attention  (report to your doctor or health care professional if they continue or are bothersome): -diarrhea -headache -nausea, vomiting -stomach pain This list may not describe all possible side effects. Call your doctor for medical advice about side effects. You may report side effects to FDA at 1-800-FDA-1088. Where should I keep my medicine? This drug is given in a hospital or clinic and will not be stored at home. NOTE: This sheet is a summary. It may not cover all possible information. If you have questions about this medicine, talk to your doctor, pharmacist, or health care provider.  2018 Elsevier/Gold Standard (2015-01-26 12:41:49)

## 2017-08-28 NOTE — Assessment & Plan Note (Signed)
He has recurrent, chronic low magnesium We will continue aggressive magnesium replacement therapy. We will give him IV magnesium as well.

## 2017-08-28 NOTE — Telephone Encounter (Signed)
Gave patient avs and calendar.   °

## 2017-08-28 NOTE — Assessment & Plan Note (Signed)
I reviewed myeloma panel with the patient and family His IgG level is slightly trending up along with M protein but light chains were stable I will wait for myeloma panel from this week to come back and if result confirm worsening disease control, I might have to switch his treatment In the meantime, he will continue Pomalyst, monthly daratumumab for treatment He will continue acyclovir for antimicrobial prophylaxis along with calcium and vitamin D He will continue aspirin for DVT prophylaxis

## 2017-08-29 LAB — KAPPA/LAMBDA LIGHT CHAINS
Kappa free light chain: 234.9 mg/L — ABNORMAL HIGH (ref 3.3–19.4)
Kappa, lambda light chain ratio: 24.47 — ABNORMAL HIGH (ref 0.26–1.65)
Lambda free light chains: 9.6 mg/L (ref 5.7–26.3)

## 2017-08-30 LAB — MULTIPLE MYELOMA PANEL, SERUM
ALBUMIN/GLOB SERPL: 1.3 (ref 0.7–1.7)
ALPHA 1: 0.2 g/dL (ref 0.0–0.4)
ALPHA2 GLOB SERPL ELPH-MCNC: 0.6 g/dL (ref 0.4–1.0)
Albumin SerPl Elph-Mcnc: 3.2 g/dL (ref 2.9–4.4)
B-GLOBULIN SERPL ELPH-MCNC: 0.7 g/dL (ref 0.7–1.3)
GAMMA GLOB SERPL ELPH-MCNC: 1.2 g/dL (ref 0.4–1.8)
GLOBULIN, TOTAL: 2.6 g/dL (ref 2.2–3.9)
IGG (IMMUNOGLOBIN G), SERUM: 1364 mg/dL (ref 700–1600)
IgA: 19 mg/dL — ABNORMAL LOW (ref 61–437)
IgM (Immunoglobulin M), Srm: 6 mg/dL — ABNORMAL LOW (ref 15–143)
M PROTEIN SERPL ELPH-MCNC: 1.1 g/dL — AB
Total Protein ELP: 5.8 g/dL — ABNORMAL LOW (ref 6.0–8.5)

## 2017-09-02 ENCOUNTER — Telehealth: Payer: Self-pay

## 2017-09-02 DIAGNOSIS — I10 Essential (primary) hypertension: Secondary | ICD-10-CM | POA: Diagnosis not present

## 2017-09-02 DIAGNOSIS — C9002 Multiple myeloma in relapse: Secondary | ICD-10-CM | POA: Diagnosis not present

## 2017-09-02 DIAGNOSIS — Z6824 Body mass index (BMI) 24.0-24.9, adult: Secondary | ICD-10-CM | POA: Diagnosis not present

## 2017-09-02 DIAGNOSIS — N189 Chronic kidney disease, unspecified: Secondary | ICD-10-CM | POA: Diagnosis not present

## 2017-09-02 NOTE — Telephone Encounter (Signed)
-----   Message from Heath Lark, MD sent at 09/02/2017 12:02 PM EDT ----- Regarding: myeloma panel is good pls let him know myeloma panel is stable No change in Rx ----- Message ----- From: Interface, [facility] In [place] Sent: 08/28/2017  10:42 AM EDT To: Heath Lark, MD

## 2017-09-02 NOTE — Telephone Encounter (Signed)
Called back and given below message. He verbalized understanding. 

## 2017-09-02 NOTE — Telephone Encounter (Signed)
Left a message asking him to call the office.

## 2017-09-09 ENCOUNTER — Other Ambulatory Visit: Payer: Self-pay | Admitting: *Deleted

## 2017-09-09 DIAGNOSIS — C9002 Multiple myeloma in relapse: Secondary | ICD-10-CM

## 2017-09-09 MED ORDER — POMALIDOMIDE 2 MG PO CAPS: 2.0000 mg | ORAL_CAPSULE | Freq: Every day | ORAL | 11 refills | Status: DC

## 2017-09-24 ENCOUNTER — Other Ambulatory Visit: Payer: Self-pay

## 2017-09-24 DIAGNOSIS — C9002 Multiple myeloma in relapse: Secondary | ICD-10-CM

## 2017-09-24 MED ORDER — POMALIDOMIDE 2 MG PO CAPS: 2.0000 mg | ORAL_CAPSULE | Freq: Every day | ORAL | 0 refills | Status: DC

## 2017-09-29 ENCOUNTER — Inpatient Hospital Stay: Payer: Medicare Other

## 2017-09-29 ENCOUNTER — Inpatient Hospital Stay (HOSPITAL_BASED_OUTPATIENT_CLINIC_OR_DEPARTMENT_OTHER): Payer: Medicare Other | Admitting: Hematology and Oncology

## 2017-09-29 ENCOUNTER — Inpatient Hospital Stay: Payer: Medicare Other | Attending: Hematology and Oncology

## 2017-09-29 ENCOUNTER — Telehealth: Payer: Self-pay | Admitting: Hematology and Oncology

## 2017-09-29 VITALS — BP 124/69 | HR 52 | Temp 97.5°F | Resp 16

## 2017-09-29 DIAGNOSIS — D539 Nutritional anemia, unspecified: Secondary | ICD-10-CM

## 2017-09-29 DIAGNOSIS — M8580 Other specified disorders of bone density and structure, unspecified site: Secondary | ICD-10-CM

## 2017-09-29 DIAGNOSIS — Z5112 Encounter for antineoplastic immunotherapy: Secondary | ICD-10-CM | POA: Diagnosis not present

## 2017-09-29 DIAGNOSIS — D631 Anemia in chronic kidney disease: Secondary | ICD-10-CM | POA: Diagnosis not present

## 2017-09-29 DIAGNOSIS — N183 Chronic kidney disease, stage 3 unspecified: Secondary | ICD-10-CM

## 2017-09-29 DIAGNOSIS — C9002 Multiple myeloma in relapse: Secondary | ICD-10-CM

## 2017-09-29 DIAGNOSIS — E876 Hypokalemia: Secondary | ICD-10-CM

## 2017-09-29 DIAGNOSIS — Z23 Encounter for immunization: Secondary | ICD-10-CM | POA: Diagnosis not present

## 2017-09-29 DIAGNOSIS — Z95828 Presence of other vascular implants and grafts: Secondary | ICD-10-CM

## 2017-09-29 LAB — CBC WITH DIFFERENTIAL/PLATELET
BASOS ABS: 0.1 10*3/uL (ref 0.0–0.1)
BASOS PCT: 2 %
Eosinophils Absolute: 0.3 10*3/uL (ref 0.0–0.5)
Eosinophils Relative: 5 %
HEMATOCRIT: 29 % — AB (ref 38.4–49.9)
Hemoglobin: 9.7 g/dL — ABNORMAL LOW (ref 13.0–17.1)
LYMPHS PCT: 48 %
Lymphs Abs: 3 10*3/uL (ref 0.9–3.3)
MCH: 33.2 pg (ref 27.2–33.4)
MCHC: 33.3 g/dL (ref 32.0–36.0)
MCV: 99.7 fL — ABNORMAL HIGH (ref 79.3–98.0)
MONO ABS: 0.6 10*3/uL (ref 0.1–0.9)
Monocytes Relative: 9 %
NEUTROS ABS: 2.3 10*3/uL (ref 1.5–6.5)
NEUTROS PCT: 36 %
PLATELETS: 304 10*3/uL (ref 140–400)
RBC: 2.91 MIL/uL — ABNORMAL LOW (ref 4.20–5.82)
RDW: 18.3 % — AB (ref 11.0–14.6)
WBC: 6.2 10*3/uL (ref 4.0–10.3)

## 2017-09-29 LAB — COMPREHENSIVE METABOLIC PANEL
ALBUMIN: 3.2 g/dL — AB (ref 3.5–5.0)
ALT: 17 U/L (ref 0–44)
ANION GAP: 6 (ref 5–15)
AST: 13 U/L — ABNORMAL LOW (ref 15–41)
Alkaline Phosphatase: 68 U/L (ref 38–126)
BILIRUBIN TOTAL: 0.5 mg/dL (ref 0.3–1.2)
BUN: 15 mg/dL (ref 8–23)
CHLORIDE: 109 mmol/L (ref 98–111)
CO2: 23 mmol/L (ref 22–32)
Calcium: 9.1 mg/dL (ref 8.9–10.3)
Creatinine, Ser: 1.93 mg/dL — ABNORMAL HIGH (ref 0.61–1.24)
GFR calc Af Amer: 38 mL/min — ABNORMAL LOW (ref >60)
GFR calc non Af Amer: 33 mL/min — ABNORMAL LOW (ref >60)
GLUCOSE: 106 mg/dL — AB (ref 70–99)
POTASSIUM: 4.1 mmol/L (ref 3.5–5.1)
SODIUM: 138 mmol/L (ref 135–145)
TOTAL PROTEIN: 6.9 g/dL (ref 6.5–8.1)

## 2017-09-29 LAB — IRON AND TIBC
IRON: 58 ug/dL (ref 42–163)
Saturation Ratios: 26 % — ABNORMAL LOW (ref 42–163)
TIBC: 225 ug/dL (ref 202–409)
UIBC: 167 ug/dL

## 2017-09-29 LAB — MAGNESIUM: MAGNESIUM: 1.6 mg/dL — AB (ref 1.7–2.4)

## 2017-09-29 LAB — FERRITIN: Ferritin: 543 ng/mL — ABNORMAL HIGH (ref 24–336)

## 2017-09-29 MED ORDER — HEPARIN SOD (PORK) LOCK FLUSH 100 UNIT/ML IV SOLN
500.0000 [IU] | Freq: Once | INTRAVENOUS | Status: DC
  Filled 2017-09-29: qty 5

## 2017-09-29 MED ORDER — SODIUM CHLORIDE 0.9 % IV SOLN
16.8000 mg/kg | Freq: Once | INTRAVENOUS | Status: AC
  Administered 2017-09-29: 1100 mg via INTRAVENOUS
  Filled 2017-09-29: qty 40

## 2017-09-29 MED ORDER — SODIUM CHLORIDE 0.9% FLUSH
10.0000 mL | Freq: Once | INTRAVENOUS | Status: AC
  Administered 2017-09-29: 10 mL
  Filled 2017-09-29: qty 10

## 2017-09-29 MED ORDER — ALTEPLASE 2 MG IJ SOLR
2.0000 mg | Freq: Once | INTRAMUSCULAR | Status: DC
  Filled 2017-09-29: qty 2

## 2017-09-29 MED ORDER — PROCHLORPERAZINE MALEATE 10 MG PO TABS
10.0000 mg | ORAL_TABLET | Freq: Once | ORAL | Status: AC
  Administered 2017-09-29: 10 mg via ORAL

## 2017-09-29 MED ORDER — HEPARIN SOD (PORK) LOCK FLUSH 100 UNIT/ML IV SOLN
500.0000 [IU] | Freq: Once | INTRAVENOUS | Status: AC | PRN
  Administered 2017-09-29: 500 [IU]
  Filled 2017-09-29: qty 5

## 2017-09-29 MED ORDER — ACETAMINOPHEN 325 MG PO TABS
650.0000 mg | ORAL_TABLET | Freq: Once | ORAL | Status: AC
  Administered 2017-09-29: 650 mg via ORAL

## 2017-09-29 MED ORDER — SODIUM CHLORIDE 0.9% FLUSH
10.0000 mL | INTRAVENOUS | Status: DC | PRN
  Administered 2017-09-29: 10 mL
  Filled 2017-09-29: qty 10

## 2017-09-29 MED ORDER — INFLUENZA VAC SPLIT HIGH-DOSE 0.5 ML IM SUSY: 0.5000 mL | PREFILLED_SYRINGE | INTRAMUSCULAR | Status: DC

## 2017-09-29 MED ORDER — ZOLEDRONIC ACID 4 MG/5ML IV CONC: 3.0000 mg | Freq: Once | INTRAVENOUS | Status: DC

## 2017-09-29 MED ORDER — ACETAMINOPHEN 325 MG PO TABS
ORAL_TABLET | ORAL | Status: AC
  Filled 2017-09-29: qty 2

## 2017-09-29 MED ORDER — PROCHLORPERAZINE MALEATE 10 MG PO TABS
ORAL_TABLET | ORAL | Status: AC
  Filled 2017-09-29: qty 1

## 2017-09-29 MED ORDER — INFLUENZA VAC SPLIT HIGH-DOSE 0.5 ML IM SUSY
0.5000 mL | PREFILLED_SYRINGE | Freq: Once | INTRAMUSCULAR | Status: AC
  Administered 2017-09-29: 0.5 mL via INTRAMUSCULAR
  Filled 2017-09-29: qty 0.5

## 2017-09-29 MED ORDER — SODIUM CHLORIDE 0.9 % IV SOLN
Freq: Once | INTRAVENOUS | Status: AC
  Administered 2017-09-29: 12:00:00 via INTRAVENOUS
  Filled 2017-09-29: qty 250

## 2017-09-29 MED ORDER — DIPHENHYDRAMINE HCL 25 MG PO CAPS
50.0000 mg | ORAL_CAPSULE | Freq: Once | ORAL | Status: AC
  Administered 2017-09-29: 50 mg via ORAL

## 2017-09-29 MED ORDER — METHYLPREDNISOLONE SODIUM SUCC 40 MG IJ SOLR
INTRAMUSCULAR | Status: AC
  Filled 2017-09-29: qty 1

## 2017-09-29 MED ORDER — METHYLPREDNISOLONE SODIUM SUCC 40 MG IJ SOLR
40.0000 mg | Freq: Once | INTRAMUSCULAR | Status: AC
  Administered 2017-09-29: 40 mg via INTRAVENOUS

## 2017-09-29 MED ORDER — DIPHENHYDRAMINE HCL 25 MG PO CAPS
ORAL_CAPSULE | ORAL | Status: AC
  Filled 2017-09-29: qty 2

## 2017-09-29 NOTE — Progress Notes (Signed)
Okay to treat with creatinine levels per Dr. Alvy Bimler.

## 2017-09-29 NOTE — Patient Instructions (Signed)
[facility] Discharge Instructions for Patients Receiving Chemotherapy  Today you received the following chemotherapy agents Darzalex  To help prevent nausea and vomiting after your treatment, we encourage you to take your nausea medication as directed.    If you develop nausea and vomiting that is not controlled by your nausea medication, call the clinic.   BELOW ARE SYMPTOMS THAT SHOULD BE REPORTED IMMEDIATELY:  *FEVER GREATER THAN 100.5 F  *CHILLS WITH OR WITHOUT FEVER  NAUSEA AND VOMITING THAT IS NOT CONTROLLED WITH YOUR NAUSEA MEDICATION  *UNUSUAL SHORTNESS OF BREATH  *UNUSUAL BRUISING OR BLEEDING  TENDERNESS IN MOUTH AND THROAT WITH OR WITHOUT PRESENCE OF ULCERS  *URINARY PROBLEMS  *BOWEL PROBLEMS  UNUSUAL RASH Items with * indicate a potential emergency and should be followed up as soon as possible.  Feel free to call the clinic should you have any questions or concerns. The clinic phone number is [phone].  Please show the [facility] at check-in to the Emergency Department and triage nurse.  Ferumoxytol injection What is this medicine? FERUMOXYTOL is an iron complex. Iron is used to make healthy red blood cells, which carry oxygen and nutrients throughout the body. This medicine is used to treat iron deficiency anemia in people with chronic kidney disease. This medicine may be used for other purposes; ask your health care provider or pharmacist if you have questions. COMMON BRAND NAME(S): Feraheme What should I tell my health care provider before I take this medicine? They need to know if you have any of these conditions: -anemia not caused by low iron levels -high levels of iron in the blood -magnetic resonance imaging (MRI) test scheduled -an unusual or allergic reaction to iron, other medicines, foods, dyes, or preservatives -pregnant or trying to get pregnant -breast-feeding How should I use this medicine? This medicine is for  injection into a vein. It is given by a health care professional in a hospital or clinic setting. Talk to your pediatrician regarding the use of this medicine in children. Special care may be needed. Overdosage: If you think you have taken too much of this medicine contact a poison control center or emergency room at once. NOTE: This medicine is only for you. Do not share this medicine with others. What if I miss a dose? It is important not to miss your dose. Call your doctor or health care professional if you are unable to keep an appointment. What may interact with this medicine? This medicine may interact with the following medications: -other iron products This list may not describe all possible interactions. Give your health care provider a list of all the medicines, herbs, non-prescription drugs, or dietary supplements you use. Also tell them if you smoke, drink alcohol, or use illegal drugs. Some items may interact with your medicine. What should I watch for while using this medicine? Visit your doctor or healthcare professional regularly. Tell your doctor or healthcare professional if your symptoms do not start to get better or if they get worse. You may need blood work done while you are taking this medicine. You may need to follow a special diet. Talk to your doctor. Foods that contain iron include: whole grains/cereals, dried fruits, beans, or peas, leafy green vegetables, and organ meats (liver, kidney). What side effects may I notice from receiving this medicine? Side effects that you should report to your doctor or health care professional as soon as possible: -allergic reactions like skin rash, itching or hives, swelling of the  face, lips, or tongue -breathing problems -changes in blood pressure -feeling faint or lightheaded, falls -fever or chills -flushing, sweating, or hot feelings -swelling of the ankles or feet Side effects that usually do not require medical attention  (report to your doctor or health care professional if they continue or are bothersome): -diarrhea -headache -nausea, vomiting -stomach pain This list may not describe all possible side effects. Call your doctor for medical advice about side effects. You may report side effects to FDA at 1-800-FDA-1088. Where should I keep my medicine? This drug is given in a hospital or clinic and will not be stored at home. NOTE: This sheet is a summary. It may not cover all possible information. If you have questions about this medicine, talk to your doctor, pharmacist, or health care provider.  2018 Elsevier/Gold Standard (2015-01-26 12:41:49)

## 2017-09-29 NOTE — Telephone Encounter (Signed)
Gave pt avs and calendar  °

## 2017-09-30 ENCOUNTER — Encounter: Payer: Self-pay | Admitting: Hematology and Oncology

## 2017-09-30 DIAGNOSIS — M419 Scoliosis, unspecified: Secondary | ICD-10-CM | POA: Diagnosis not present

## 2017-09-30 DIAGNOSIS — I129 Hypertensive chronic kidney disease with stage 1 through stage 4 chronic kidney disease, or unspecified chronic kidney disease: Secondary | ICD-10-CM | POA: Diagnosis not present

## 2017-09-30 DIAGNOSIS — M47816 Spondylosis without myelopathy or radiculopathy, lumbar region: Secondary | ICD-10-CM | POA: Diagnosis not present

## 2017-09-30 DIAGNOSIS — S81012A Laceration without foreign body, left knee, initial encounter: Secondary | ICD-10-CM | POA: Diagnosis not present

## 2017-09-30 DIAGNOSIS — S8992XA Unspecified injury of left lower leg, initial encounter: Secondary | ICD-10-CM | POA: Diagnosis not present

## 2017-09-30 DIAGNOSIS — M25512 Pain in left shoulder: Secondary | ICD-10-CM | POA: Diagnosis not present

## 2017-09-30 DIAGNOSIS — S4992XA Unspecified injury of left shoulder and upper arm, initial encounter: Secondary | ICD-10-CM | POA: Diagnosis not present

## 2017-09-30 DIAGNOSIS — R0781 Pleurodynia: Secondary | ICD-10-CM | POA: Diagnosis not present

## 2017-09-30 DIAGNOSIS — M549 Dorsalgia, unspecified: Secondary | ICD-10-CM | POA: Diagnosis not present

## 2017-09-30 DIAGNOSIS — S4991XA Unspecified injury of right shoulder and upper arm, initial encounter: Secondary | ICD-10-CM | POA: Diagnosis not present

## 2017-09-30 DIAGNOSIS — E785 Hyperlipidemia, unspecified: Secondary | ICD-10-CM | POA: Diagnosis not present

## 2017-09-30 DIAGNOSIS — S80212A Abrasion, left knee, initial encounter: Secondary | ICD-10-CM | POA: Diagnosis not present

## 2017-09-30 DIAGNOSIS — S299XXA Unspecified injury of thorax, initial encounter: Secondary | ICD-10-CM | POA: Diagnosis not present

## 2017-09-30 DIAGNOSIS — M25511 Pain in right shoulder: Secondary | ICD-10-CM | POA: Diagnosis not present

## 2017-09-30 DIAGNOSIS — S3992XA Unspecified injury of lower back, initial encounter: Secondary | ICD-10-CM | POA: Diagnosis not present

## 2017-09-30 DIAGNOSIS — M25562 Pain in left knee: Secondary | ICD-10-CM | POA: Diagnosis not present

## 2017-09-30 DIAGNOSIS — N189 Chronic kidney disease, unspecified: Secondary | ICD-10-CM | POA: Diagnosis not present

## 2017-09-30 DIAGNOSIS — M545 Low back pain: Secondary | ICD-10-CM | POA: Diagnosis not present

## 2017-09-30 LAB — MULTIPLE MYELOMA PANEL, SERUM
ALBUMIN/GLOB SERPL: 1.1 (ref 0.7–1.7)
Albumin SerPl Elph-Mcnc: 3.2 g/dL (ref 2.9–4.4)
Alpha 1: 0.3 g/dL (ref 0.0–0.4)
Alpha2 Glob SerPl Elph-Mcnc: 0.8 g/dL (ref 0.4–1.0)
B-Globulin SerPl Elph-Mcnc: 0.8 g/dL (ref 0.7–1.3)
Gamma Glob SerPl Elph-Mcnc: 1.3 g/dL (ref 0.4–1.8)
Globulin, Total: 3.1 g/dL (ref 2.2–3.9)
IGA: 22 mg/dL — AB (ref 61–437)
IGM (IMMUNOGLOBULIN M), SRM: 6 mg/dL — AB (ref 15–143)
IgG (Immunoglobin G), Serum: 1555 mg/dL (ref 700–1600)
M Protein SerPl Elph-Mcnc: 1.2 g/dL — ABNORMAL HIGH
Total Protein ELP: 6.3 g/dL (ref 6.0–8.5)

## 2017-09-30 LAB — KAPPA/LAMBDA LIGHT CHAINS
KAPPA FREE LGHT CHN: 239 mg/L — AB (ref 3.3–19.4)
Kappa, lambda light chain ratio: 19.43 — ABNORMAL HIGH (ref 0.26–1.65)
Lambda free light chains: 12.3 mg/L (ref 5.7–26.3)

## 2017-09-30 NOTE — Assessment & Plan Note (Signed)
I recommend increase oral fluid intake as tolerated We will monitor his kidney function carefully. 

## 2017-09-30 NOTE — Progress Notes (Signed)
[name] OFFICE PROGRESS NOTE  Patient Care Team: Calvin Lei, MD as PCP - General (Family Medicine) Calvin Lark, MD as Consulting Physician (Hematology and Oncology) Calvin Mccormick, Calvin Gibson, RN as [place] Management Calvin Pace, LCSW as Veterinary surgeon)  ASSESSMENT & PLAN:  Multiple myeloma in relapse [facility]) His last few myeloma panel fluctuate up and down but overall he is not symptomatic I will wait for myeloma panel from this week to come back and if result confirm worsening disease control, I might have to switch his treatment In the meantime, he will continue Pomalyst, monthly daratumumab for treatment He will continue acyclovir for antimicrobial prophylaxis along with calcium and vitamin D He will continue aspirin for DVT prophylaxis  Anemia in chronic renal disease He has multifactorial anemia, combination of anemia chronic kidney disease and recent blood loss/iron deficiency anemia His hemoglobin and iron studies are stable.  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  Chronic renal insufficiency, stage III (moderate) I recommend increase oral fluid intake as tolerated We will monitor his kidney function carefully.   No orders of the defined types were placed in this encounter.   INTERVAL HISTORY: Please see below for problem oriented charting. He returns for further chemotherapy and follow-up He feels well Denies recent infection, fever or chills No new bone pain The patient denies any recent signs or symptoms of bleeding such as spontaneous epistaxis, hematuria or hematochezia.   SUMMARY OF ONCOLOGIC HISTORY:   Multiple myeloma in relapse ([place])   12/12/2010 Initial Diagnosis    Multiple myeloma    02/16/2014 Imaging    Skeletal survey  showed diffuse osteopenia    03/02/2014 Bone Marrow Biopsy    Accession: [id number] biopsy showed only 5 % plasma cells. However, the biopsy was difficult and the bone was fragmented during the procedure. Cytogenetics and FISH study is normal    03/03/2014 Procedure    he has port placement    03/10/2014 - 05/19/2014 Chemotherapy    he received elotuzumab and revlimid    05/20/2014 - 02/25/2016 Chemotherapy    Treatment is switched to maintenance Revlimid only. Treatment is stopped due to progressive disease    11/15/2014 Adverse Reaction    He has mild worsening anemia. Does of Revlimid reduced to 5 mg 21 days on, 7 days off    03/06/2016 -  Chemotherapy    He received Daratumumab and Velcade. Velcade is stopped on 07/24/16    08/14/2016 Miscellaneous    Pomalyst is added along with Daratumumab    10/17/2016 Surgery    EGD was performed for coffee-ground emesis. Multiple linear superficial ulcers were noted in the esophagus from 25-35 cm from insertion. Severe esophagitis was noted from 30-35 cm from insertion. Multiple biopsies were obtained. A small hiatal hernia was noted. The gastric cavity contained coffee-ground fluid, after suctioning and lavage, no obvious gastric lesions/erosion/ulceration/erythema was noted. Biopsies were taken from the antrum to rule out H. Pylori. Duodenum bulb appeared unremarkable. A small diverticulum was noted in second portion of the duodenum.      12/06/2016 Imaging    Ct scan abdomen No acute findings.  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  Stable small hiatal hernia.  Colonic diverticulosis, without radiographic evidence of diverticulitis.     REVIEW OF SYSTEMS:  Constitutional: Denies fevers, chills or abnormal weight loss Eyes: Denies blurriness of vision Ears, nose, mouth, throat, and face: Denies mucositis or sore throat Respiratory: Denies cough, dyspnea or wheezes Cardiovascular: Denies  palpitation, chest discomfort or lower extremity swelling Gastrointestinal:  Denies nausea, heartburn or change in bowel habits Skin: Denies abnormal skin rashes Lymphatics: Denies new lymphadenopathy or easy bruising Neurological:Denies numbness, tingling or new weaknesses Behavioral/Psych: Mood is stable, no new changes  All other systems were reviewed with the patient and are negative.  I have reviewed the past medical history, past surgical history, social history and family history with the patient and they are unchanged from previous note.  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  MEDICATIONS:  Current Outpatient Medications  Medication Sig Dispense Refill  . acyclovir (ZOVIRAX) 400 MG tablet Take 1 tablet (400 mg total) by mouth daily. 60 tablet 11  . allopurinol (ZYLOPRIM) 100 MG tablet Take 100 mg by mouth 2 (two) times daily.     Marland Kitchen amLODipine (NORVASC) 10 MG tablet Take 10 mg by mouth daily.  1  . aspirin EC 81 MG tablet Take 81 mg by mouth daily.    Marland Kitchen atorvastatin (LIPITOR) 20 MG tablet Take 20 mg by mouth at bedtime.     . brimonidine (ALPHAGAN) 0.15 % ophthalmic solution Place 1 drop into the right eye 3 (three) times daily.    . cholecalciferol (VITAMIN D) 1000 UNITS tablet Take 1,000 Units by mouth daily.    . dorzolamide-timolol (COSOPT) 22.3-6.8 MG/ML ophthalmic solution Place 1 drop into both eyes 2 (two) times daily.     . fluorometholone (FML) 0.1 % ophthalmic suspension Place 1 drop into both eyes daily.    Marland Kitchen latanoprost (XALATAN) 0.005 % ophthalmic solution Place 1 drop into the right eye at bedtime.    . lidocaine-prilocaine (EMLA) cream Apply to affected area once 30 g 3  . loperamide (IMODIUM A-D) 2 MG tablet 1 po after each watery BM.  Maximum 6 per 24hrs. 30 tablet PRN  . magnesium oxide (MAG-OX) 400 (241.3 Mg) MG tablet Take 1 tablet (400 mg total) by mouth 2 (two) times daily. 60 tablet 9  . ondansetron (ZOFRAN) 8 MG tablet Take 1 tablet (8 mg  total) by mouth 2 (two) times daily as needed (Nausea or vomiting). 30 tablet 1  . pantoprazole (PROTONIX) 40 MG tablet Take 1 tablet (40 mg total) by mouth daily. 180 tablet 3  . pomalidomide (POMALYST) 2 MG capsule Take 1 capsule (2 mg total) by mouth daily. Take with water on days 1-21. Repeat every 28 days. 21 capsule 0  . potassium chloride SA (K-DUR,KLOR-CON) 20 MEQ tablet Take 1 tablet (20 mEq total) by mouth 2 (two) times daily. 14 tablet 0   No current facility-administered medications for this visit.     PHYSICAL EXAMINATION: ECOG PERFORMANCE STATUS: 1 - Symptomatic but completely ambulatory  Vitals:   09/29/17 1110  BP: (!) 144/61  Pulse: (!) 57  Resp: 18  Temp: (!) 97.5 F (36.4 C)  SpO2: 100%   Filed Weights   09/29/17 1110  Weight: 154 lb 8 oz (70.1 kg)    GENERAL:alert, no distress and comfortable SKIN: skin color, texture, turgor are normal, no rashes or significant lesions EYES: normal, Conjunctiva are pink and non-injected, sclera clear OROPHARYNX:no exudate, no erythema and lips, buccal mucosa, and tongue normal  NECK: supple, thyroid normal size, non-tender, without nodularity LYMPH:  no palpable lymphadenopathy in the cervical, axillary or inguinal LUNGS:  clear to auscultation and percussion with normal breathing effort HEART: regular rate & rhythm and no murmurs and no lower extremity edema ABDOMEN:abdomen soft, non-tender and normal bowel sounds Musculoskeletal:no cyanosis of digits and no clubbing  NEURO: alert & oriented x 3 with fluent speech, no focal motor/sensory deficits  LABORATORY DATA:  I have reviewed the data as listed    Component Value Date/Time   NA 138 09/29/2017 1022   NA 140 01/09/2017 0913   K 4.1 09/29/2017 1022   K 3.7 01/09/2017 0913   CL 109 09/29/2017 1022   CL 107 06/29/2012 1131   CO2 23 09/29/2017 1022   CO2 21 (L) 01/09/2017 0913   GLUCOSE 106 (H) 09/29/2017 1022   GLUCOSE 80 01/09/2017 0913   GLUCOSE 143 (H)  06/29/2012 1131   BUN 15 09/29/2017 1022   BUN 9.8 01/09/2017 0913   CREATININE 1.93 (H) 09/29/2017 1022   CREATININE 1.5 (H) 01/09/2017 0913   CALCIUM 9.1 09/29/2017 1022   CALCIUM 7.5 (L) 01/09/2017 0913   PROT 6.9 09/29/2017 1022   PROT 5.7 (L) 01/09/2017 0913   ALBUMIN 3.2 (L) 09/29/2017 1022   ALBUMIN 2.9 (L) 01/09/2017 0913   AST 13 (L) 09/29/2017 1022   AST 12 01/09/2017 0913   ALT 17 09/29/2017 1022   ALT 10 01/09/2017 0913   ALKPHOS 68 09/29/2017 1022   ALKPHOS 55 01/09/2017 0913   BILITOT 0.5 09/29/2017 1022   BILITOT 0.44 01/09/2017 0913   GFRNONAA 33 (L) 09/29/2017 1022   GFRAA 38 (L) 09/29/2017 1022    No results found for: SPEP, UPEP  Lab Results  Component Value Date   WBC 6.2 09/29/2017   NEUTROABS 2.3 09/29/2017   HGB 9.7 (L) 09/29/2017   HCT 29.0 (L) 09/29/2017   MCV 99.7 (H) 09/29/2017   PLT 304 09/29/2017      Chemistry      Component Value Date/Time   NA 138 09/29/2017 1022   NA 140 01/09/2017 0913   K 4.1 09/29/2017 1022   K 3.7 01/09/2017 0913   CL 109 09/29/2017 1022   CL 107 06/29/2012 1131   CO2 23 09/29/2017 1022   CO2 21 (L) 01/09/2017 0913   BUN 15 09/29/2017 1022   BUN 9.8 01/09/2017 0913   CREATININE 1.93 (H) 09/29/2017 1022   CREATININE 1.5 (H) 01/09/2017 0913      Component Value Date/Time   CALCIUM 9.1 09/29/2017 1022   CALCIUM 7.5 (L) 01/09/2017 0913   ALKPHOS 68 09/29/2017 1022   ALKPHOS 55 01/09/2017 0913   AST 13 (L) 09/29/2017 1022   AST 12 01/09/2017 0913   ALT 17 09/29/2017 1022   ALT 10 01/09/2017 0913   BILITOT 0.5 09/29/2017 1022   BILITOT 0.44 01/09/2017 0913      All questions were answered. The patient knows to call the clinic with any problems, questions or concerns. No barriers to learning was detected.  I spent 15 minutes counseling the patient face to face. The total time spent in the appointment was 20 minutes and more than 50% was on counseling and review of test results  Calvin Lark,  MD 09/30/2017 7:47 AM

## 2017-09-30 NOTE — Assessment & Plan Note (Signed)
He has recurrent, chronic low magnesium We will continue aggressive magnesium replacement therapy. 

## 2017-09-30 NOTE — Assessment & Plan Note (Signed)
He has multifactorial anemia, combination of anemia chronic kidney disease and recent blood loss/iron deficiency anemia His hemoglobin and iron studies are stable.  Continue close observation I plan to check iron studies on a regular basis and replace as needed 

## 2017-09-30 NOTE — Assessment & Plan Note (Signed)
His last few myeloma panel fluctuate up and down but overall he is not symptomatic I will wait for myeloma panel from this week to come back and if result confirm worsening disease control, I might have to switch his treatment In the meantime, he will continue Pomalyst, monthly daratumumab for treatment He will continue acyclovir for antimicrobial prophylaxis along with calcium and vitamin D He will continue aspirin for DVT prophylaxis

## 2017-10-01 ENCOUNTER — Telehealth: Payer: Self-pay

## 2017-10-01 NOTE — Telephone Encounter (Signed)
-----   Message from Heath Lark, MD sent at 09/30/2017  5:31 PM EDT ----- Regarding: myeloma panel Let him/wife know myeloma panel is stable ----- Message ----- From: Interface, Lab In [place] Sent: 09/29/2017  11:00 AM EDT To: Heath Lark, MD

## 2017-10-01 NOTE — Telephone Encounter (Signed)
Called and left a message asking him to call the office. 

## 2017-10-02 NOTE — Telephone Encounter (Signed)
Called and left below message. Instructed to call office for questions.

## 2017-10-08 DIAGNOSIS — Z0000 Encounter for general adult medical examination without abnormal findings: Secondary | ICD-10-CM | POA: Diagnosis not present

## 2017-10-08 DIAGNOSIS — Z6823 Body mass index (BMI) 23.0-23.9, adult: Secondary | ICD-10-CM | POA: Diagnosis not present

## 2017-10-08 DIAGNOSIS — I10 Essential (primary) hypertension: Secondary | ICD-10-CM | POA: Diagnosis not present

## 2017-10-08 DIAGNOSIS — C9002 Multiple myeloma in relapse: Secondary | ICD-10-CM | POA: Diagnosis not present

## 2017-10-15 ENCOUNTER — Other Ambulatory Visit: Payer: Self-pay

## 2017-10-15 DIAGNOSIS — C9002 Multiple myeloma in relapse: Secondary | ICD-10-CM

## 2017-10-15 MED ORDER — POMALIDOMIDE 2 MG PO CAPS: 2.0000 mg | ORAL_CAPSULE | Freq: Every day | ORAL | 0 refills | Status: DC

## 2017-10-27 ENCOUNTER — Inpatient Hospital Stay: Payer: Medicare Other

## 2017-10-27 ENCOUNTER — Inpatient Hospital Stay (HOSPITAL_BASED_OUTPATIENT_CLINIC_OR_DEPARTMENT_OTHER): Payer: Medicare Other | Admitting: Hematology and Oncology

## 2017-10-27 ENCOUNTER — Telehealth: Payer: Self-pay | Admitting: Hematology and Oncology

## 2017-10-27 ENCOUNTER — Telehealth: Payer: Self-pay

## 2017-10-27 ENCOUNTER — Inpatient Hospital Stay: Payer: Medicare Other | Attending: Hematology and Oncology

## 2017-10-27 VITALS — BP 123/64 | HR 52 | Temp 98.0°F | Resp 17

## 2017-10-27 DIAGNOSIS — C9002 Multiple myeloma in relapse: Secondary | ICD-10-CM | POA: Diagnosis not present

## 2017-10-27 DIAGNOSIS — Z79899 Other long term (current) drug therapy: Secondary | ICD-10-CM | POA: Insufficient documentation

## 2017-10-27 DIAGNOSIS — D631 Anemia in chronic kidney disease: Secondary | ICD-10-CM | POA: Diagnosis not present

## 2017-10-27 DIAGNOSIS — Z7982 Long term (current) use of aspirin: Secondary | ICD-10-CM

## 2017-10-27 DIAGNOSIS — N183 Chronic kidney disease, stage 3 unspecified: Secondary | ICD-10-CM

## 2017-10-27 DIAGNOSIS — M8580 Other specified disorders of bone density and structure, unspecified site: Secondary | ICD-10-CM

## 2017-10-27 DIAGNOSIS — Z9221 Personal history of antineoplastic chemotherapy: Secondary | ICD-10-CM | POA: Diagnosis not present

## 2017-10-27 DIAGNOSIS — Z5112 Encounter for antineoplastic immunotherapy: Secondary | ICD-10-CM | POA: Diagnosis not present

## 2017-10-27 DIAGNOSIS — E876 Hypokalemia: Secondary | ICD-10-CM

## 2017-10-27 DIAGNOSIS — D539 Nutritional anemia, unspecified: Secondary | ICD-10-CM

## 2017-10-27 LAB — COMPREHENSIVE METABOLIC PANEL WITH GFR
ALT: 14 U/L (ref 0–44)
AST: 14 U/L — ABNORMAL LOW (ref 15–41)
Albumin: 3.3 g/dL — ABNORMAL LOW (ref 3.5–5.0)
Alkaline Phosphatase: 72 U/L (ref 38–126)
Anion gap: 9 (ref 5–15)
BUN: 14 mg/dL (ref 8–23)
CO2: 22 mmol/L (ref 22–32)
Calcium: 8.8 mg/dL — ABNORMAL LOW (ref 8.9–10.3)
Chloride: 109 mmol/L (ref 98–111)
Creatinine, Ser: 1.83 mg/dL — ABNORMAL HIGH (ref 0.61–1.24)
GFR calc Af Amer: 40 mL/min — ABNORMAL LOW
GFR calc non Af Amer: 35 mL/min — ABNORMAL LOW
Glucose, Bld: 79 mg/dL (ref 70–99)
Potassium: 4.8 mmol/L (ref 3.5–5.1)
Sodium: 140 mmol/L (ref 135–145)
Total Bilirubin: 0.6 mg/dL (ref 0.3–1.2)
Total Protein: 6.7 g/dL (ref 6.5–8.1)

## 2017-10-27 LAB — CBC WITH DIFFERENTIAL/PLATELET
Abs Immature Granulocytes: 0.03 10*3/uL (ref 0.00–0.07)
BASOS ABS: 0.2 10*3/uL — AB (ref 0.0–0.1)
Basophils Relative: 3 %
EOS ABS: 0.3 10*3/uL (ref 0.0–0.5)
Eosinophils Relative: 5 %
HEMATOCRIT: 28.3 % — AB (ref 39.0–52.0)
Hemoglobin: 9.3 g/dL — ABNORMAL LOW (ref 13.0–17.0)
IMMATURE GRANULOCYTES: 1 %
Lymphocytes Relative: 53 %
Lymphs Abs: 3.3 10*3/uL (ref 0.7–4.0)
MCH: 33.1 pg (ref 26.0–34.0)
MCHC: 32.9 g/dL (ref 30.0–36.0)
MCV: 100.7 fL — AB (ref 80.0–100.0)
Monocytes Absolute: 0.5 10*3/uL (ref 0.1–1.0)
Monocytes Relative: 9 %
NEUTROS PCT: 29 %
NRBC: 0 % (ref 0.0–0.2)
Neutro Abs: 1.8 10*3/uL (ref 1.7–7.7)
Platelets: 251 10*3/uL (ref 150–400)
RBC: 2.81 MIL/uL — AB (ref 4.22–5.81)
RDW: 15.7 % — AB (ref 11.5–15.5)
WBC: 6 10*3/uL (ref 4.0–10.5)

## 2017-10-27 LAB — FERRITIN: Ferritin: 443 ng/mL — ABNORMAL HIGH (ref 24–336)

## 2017-10-27 LAB — IRON AND TIBC
Iron: 77 ug/dL (ref 42–163)
Saturation Ratios: 30 % — ABNORMAL LOW (ref 42–163)
TIBC: 254 ug/dL (ref 202–409)
UIBC: 177 ug/dL

## 2017-10-27 LAB — MAGNESIUM: Magnesium: 1.4 mg/dL — CL (ref 1.7–2.4)

## 2017-10-27 MED ORDER — SODIUM CHLORIDE 0.9 % IV SOLN
16.8000 mg/kg | Freq: Once | INTRAVENOUS | Status: AC
  Administered 2017-10-27: 1100 mg via INTRAVENOUS
  Filled 2017-10-27: qty 40

## 2017-10-27 MED ORDER — DIPHENHYDRAMINE HCL 25 MG PO CAPS
50.0000 mg | ORAL_CAPSULE | Freq: Once | ORAL | Status: AC
  Administered 2017-10-27: 50 mg via ORAL

## 2017-10-27 MED ORDER — METHYLPREDNISOLONE SODIUM SUCC 40 MG IJ SOLR
40.0000 mg | Freq: Once | INTRAMUSCULAR | Status: AC
  Administered 2017-10-27: 40 mg via INTRAVENOUS

## 2017-10-27 MED ORDER — PROCHLORPERAZINE MALEATE 10 MG PO TABS
10.0000 mg | ORAL_TABLET | Freq: Once | ORAL | Status: AC
  Administered 2017-10-27: 10 mg via ORAL

## 2017-10-27 MED ORDER — ACETAMINOPHEN 325 MG PO TABS
ORAL_TABLET | ORAL | Status: AC
  Filled 2017-10-27: qty 2

## 2017-10-27 MED ORDER — ACETAMINOPHEN 325 MG PO TABS
650.0000 mg | ORAL_TABLET | Freq: Once | ORAL | Status: AC
  Administered 2017-10-27: 650 mg via ORAL

## 2017-10-27 MED ORDER — METHYLPREDNISOLONE SODIUM SUCC 40 MG IJ SOLR
INTRAMUSCULAR | Status: AC
  Filled 2017-10-27: qty 1

## 2017-10-27 MED ORDER — SODIUM CHLORIDE 0.9 % IV SOLN
Freq: Once | INTRAVENOUS | Status: AC
  Administered 2017-10-27: 12:00:00 via INTRAVENOUS
  Filled 2017-10-27: qty 250

## 2017-10-27 MED ORDER — SODIUM CHLORIDE 0.9% FLUSH
10.0000 mL | INTRAVENOUS | Status: DC | PRN
  Administered 2017-10-27: 10 mL
  Filled 2017-10-27: qty 10

## 2017-10-27 MED ORDER — HEPARIN SOD (PORK) LOCK FLUSH 100 UNIT/ML IV SOLN
500.0000 [IU] | Freq: Once | INTRAVENOUS | Status: AC | PRN
  Administered 2017-10-27: 500 [IU]
  Filled 2017-10-27: qty 5

## 2017-10-27 MED ORDER — DIPHENHYDRAMINE HCL 25 MG PO CAPS
ORAL_CAPSULE | ORAL | Status: AC
  Filled 2017-10-27: qty 2

## 2017-10-27 MED ORDER — PROCHLORPERAZINE MALEATE 10 MG PO TABS
ORAL_TABLET | ORAL | Status: AC
  Filled 2017-10-27: qty 1

## 2017-10-27 NOTE — Telephone Encounter (Signed)
Oral Oncology Patient Advocate Encounter  I received a fax from [facility] that they have not been successful in getting in touch with the patient to refill his Pomalyst. I called the patient and left a message with [place] phone number [phone].    [place] Patient [place] Phone [phone] Fax [phone]

## 2017-10-27 NOTE — Patient Instructions (Signed)
Implanted Port Home Guide An implanted port is a type of central line that is placed under the skin. Central lines are used to provide IV access when treatment or nutrition needs to be given through a person's veins. Implanted ports are used for long-term IV access. An implanted port may be placed because:  You need IV medicine that would be irritating to the small veins in your hands or arms.  You need long-term IV medicines, such as antibiotics.  You need IV nutrition for a long period.  You need frequent blood draws for lab tests.  You need dialysis.  Implanted ports are usually placed in the chest area, but they can also be placed in the upper arm, the abdomen, or the leg. An implanted port has two main parts:  Reservoir. The reservoir is round and will appear as a small, raised area under your skin. The reservoir is the part where a needle is inserted to give medicines or draw blood.  Catheter. The catheter is a thin, flexible tube that extends from the reservoir. The catheter is placed into a large vein. Medicine that is inserted into the reservoir goes into the catheter and then into the vein.  How will I care for my incision site? Do not get the incision site wet. Bathe or shower as directed by your health care provider. How is my port accessed? Special steps must be taken to access the port:  Before the port is accessed, a numbing cream can be placed on the skin. This helps numb the skin over the port site.  Your health care provider uses a sterile technique to access the port. ? Your health care provider must put on a mask and sterile gloves. ? The skin over your port is cleaned carefully with an antiseptic and allowed to dry. ? The port is gently pinched between sterile gloves, and a needle is inserted into the port.  Only "non-coring" port needles should be used to access the port. Once the port is accessed, a blood return should be checked. This helps ensure that the port  is in the vein and is not clogged.  If your port needs to remain accessed for a constant infusion, a clear (transparent) bandage will be placed over the needle site. The bandage and needle will need to be changed every week, or as directed by your health care provider.  Keep the bandage covering the needle clean and dry. Do not get it wet. Follow your health care provider's instructions on how to take a shower or bath while the port is accessed.  If your port does not need to stay accessed, no bandage is needed over the port.  What is flushing? Flushing helps keep the port from getting clogged. Follow your health care provider's instructions on how and when to flush the port. Ports are usually flushed with saline solution or a medicine called heparin. The need for flushing will depend on how the port is used.  If the port is used for intermittent medicines or blood draws, the port will need to be flushed: ? After medicines have been given. ? After blood has been drawn. ? As part of routine maintenance.  If a constant infusion is running, the port may not need to be flushed.  How long will my port stay implanted? The port can stay in for as long as your health care provider thinks it is needed. When it is time for the port to come out, surgery will be   done to remove it. The procedure is similar to the one performed when the port was put in. When should I seek immediate medical care? When you have an implanted port, you should seek immediate medical care if:  You notice a bad smell coming from the incision site.  You have swelling, redness, or drainage at the incision site.  You have more swelling or pain at the port site or the surrounding area.  You have a fever that is not controlled with medicine.  This information is not intended to replace advice given to you by your health care provider. Make sure you discuss any questions you have with your health care provider. Document  Released: 12/24/2004 Document Revised: 06/01/2015 Document Reviewed: 08/31/2012 [employer] Interactive Patient Education  2017 [employer]  

## 2017-10-27 NOTE — Telephone Encounter (Signed)
Gave patient avs and calendar.   °

## 2017-10-27 NOTE — Patient Instructions (Signed)
[facility] Discharge Instructions for Patients Receiving Chemotherapy  Today you received the following chemotherapy agents: Darzalex  To help prevent nausea and vomiting after your treatment, we encourage you to take your nausea medication as directed.    If you develop nausea and vomiting that is not controlled by your nausea medication, call the clinic.   BELOW ARE SYMPTOMS THAT SHOULD BE REPORTED IMMEDIATELY:  *FEVER GREATER THAN 100.5 F  *CHILLS WITH OR WITHOUT FEVER  NAUSEA AND VOMITING THAT IS NOT CONTROLLED WITH YOUR NAUSEA MEDICATION  *UNUSUAL SHORTNESS OF BREATH  *UNUSUAL BRUISING OR BLEEDING  TENDERNESS IN MOUTH AND THROAT WITH OR WITHOUT PRESENCE OF ULCERS  *URINARY PROBLEMS  *BOWEL PROBLEMS  UNUSUAL RASH Items with * indicate a potential emergency and should be followed up as soon as possible.  Feel free to call the clinic should you have any questions or concerns. The clinic phone number is [phone].  Please show the CHEMO ALERT CARD at check-in to the Emergency Department and triage nurse.   

## 2017-10-28 ENCOUNTER — Encounter: Payer: Self-pay | Admitting: Hematology and Oncology

## 2017-10-28 NOTE — Assessment & Plan Note (Signed)
His last few myeloma panel fluctuate up and down but overall he is not symptomatic His last light chain measurement is stable In the meantime, he will continue Pomalyst, monthly daratumumab for treatment He will continue acyclovir for antimicrobial prophylaxis along with calcium and vitamin D He will continue aspirin for DVT prophylaxis 

## 2017-10-28 NOTE — Telephone Encounter (Signed)
Called and left a message asking him to call the office. Instructed and given phone number for [facility] to call and set up delivery.

## 2017-10-28 NOTE — Assessment & Plan Note (Signed)
He has recurrent, chronic low magnesium We will continue aggressive magnesium replacement therapy. 

## 2017-10-28 NOTE — Progress Notes (Signed)
[place] OFFICE PROGRESS NOTE  Patient Care Team: Lucianne Lei, MD as PCP - General (Family Medicine) Heath Lark, MD as Consulting Physician (Hematology and Oncology) [place], [name], RN as [place] Management Beverely Pace, LCSW as Veterinary surgeon)  ASSESSMENT & PLAN:  Multiple myeloma in relapse [facility]) His last few myeloma panel fluctuate up and down but overall he is not symptomatic His last light chain measurement is stable In the meantime, he will continue Pomalyst, monthly daratumumab for treatment He will continue acyclovir for antimicrobial prophylaxis along with calcium and vitamin D He will continue aspirin for DVT prophylaxis  Anemia in chronic renal disease He has multifactorial anemia, combination of anemia chronic kidney disease and recent blood loss/iron deficiency anemia His hemoglobin and iron studies are stable.  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  Chronic renal insufficiency, stage III (moderate) I recommend increase oral fluid intake as tolerated We will monitor his kidney function carefully.   Orders Placed This Encounter  Procedures  . Kappa/lambda light chains    Standing Status:   Standing    Number of Occurrences:   9    Standing Expiration Date:   10/29/2018  . Multiple Myeloma Panel (SPEP&IFE w/QIG)    Standing Status:   Standing    Number of Occurrences:   9    Standing Expiration Date:   10/29/2018    INTERVAL HISTORY: Please see below for problem oriented charting. He returns for further follow-up He is doing well Denies recent infection, fever or chills He has occasional diarrhea No new bone pain Denies peripheral neuropathy  The patient denies any recent signs or symptoms of bleeding such as  spontaneous epistaxis, hematuria or hematochezia.   SUMMARY OF ONCOLOGIC HISTORY:   Multiple myeloma in relapse ([place])   12/12/2010 Initial Diagnosis    Multiple myeloma    02/16/2014 Imaging    Skeletal survey showed diffuse osteopenia    03/02/2014 Bone Marrow Biopsy    Accession: [id number] BM biopsy showed only 5 % plasma cells. However, the biopsy was difficult and the bone was fragmented during the procedure. Cytogenetics and FISH study is normal    03/03/2014 Procedure    he has port placement    03/10/2014 - 05/19/2014 Chemotherapy    he received elotuzumab and revlimid    05/20/2014 - 02/25/2016 Chemotherapy    Treatment is switched to maintenance Revlimid only. Treatment is stopped due to progressive disease    11/15/2014 Adverse Reaction    He has mild worsening anemia. Does of Revlimid reduced to 5 mg 21 days on, 7 days off    03/06/2016 -  Chemotherapy    He received Daratumumab and Velcade. Velcade is stopped on 07/24/16    08/14/2016 Miscellaneous    Pomalyst is added along with Daratumumab    10/17/2016 Surgery    EGD was performed for coffee-ground emesis. Multiple linear superficial ulcers were noted in the esophagus from 25-35 cm from insertion. Severe esophagitis was noted from 30-35 cm from insertion. Multiple biopsies were obtained. A small hiatal hernia was noted. The gastric cavity contained coffee-ground fluid, after suctioning and lavage, no obvious gastric lesions/erosion/ulceration/erythema was noted. Biopsies were taken from the antrum to rule out H. Pylori. Duodenum bulb appeared unremarkable. A small diverticulum was noted in second portion of the duodenum.  12/06/2016 Imaging    Ct scan abdomen No acute findings.  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  Stable small hiatal hernia.  Colonic diverticulosis, without radiographic evidence of diverticulitis.     REVIEW OF SYSTEMS:   Constitutional: Denies  fevers, chills or abnormal weight loss Eyes: Denies blurriness of vision Ears, nose, mouth, throat, and face: Denies mucositis or sore throat Respiratory: Denies cough, dyspnea or wheezes Cardiovascular: Denies palpitation, chest discomfort or lower extremity swelling Gastrointestinal:  Denies nausea, heartburn or change in bowel habits Skin: Denies abnormal skin rashes Lymphatics: Denies new lymphadenopathy or easy bruising Neurological:Denies numbness, tingling or new weaknesses Behavioral/Psych: Mood is stable, no new changes  All other systems were reviewed with the patient and are negative.  I have reviewed the past medical history, past surgical history, social history and family history with the patient and they are unchanged from previous note.  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  MEDICATIONS:  Current Outpatient Medications  Medication Sig Dispense Refill  . acyclovir (ZOVIRAX) 400 MG tablet Take 1 tablet (400 mg total) by mouth daily. 60 tablet 11  . allopurinol (ZYLOPRIM) 100 MG tablet Take 100 mg by mouth 2 (two) times daily.     Marland Kitchen amLODipine (NORVASC) 10 MG tablet Take 10 mg by mouth daily.  1  . aspirin EC 81 MG tablet Take 81 mg by mouth daily.    Marland Kitchen atorvastatin (LIPITOR) 20 MG tablet Take 20 mg by mouth at bedtime.     . brimonidine (ALPHAGAN) 0.15 % ophthalmic solution Place 1 drop into the right eye 3 (three) times daily.    . cholecalciferol (VITAMIN D) 1000 UNITS tablet Take 1,000 Units by mouth daily.    . dorzolamide-timolol (COSOPT) 22.3-6.8 MG/ML ophthalmic solution Place 1 drop into both eyes 2 (two) times daily.     . fluorometholone (FML) 0.1 % ophthalmic suspension Place 1 drop into both eyes daily.    Marland Kitchen latanoprost (XALATAN) 0.005 % ophthalmic solution Place 1 drop into the right eye at bedtime.    . lidocaine-prilocaine (EMLA) cream Apply to affected area once 30 g 3  . loperamide (IMODIUM A-D) 2 MG tablet 1 po after each watery BM.   Maximum 6 per 24hrs. 30 tablet PRN  . magnesium oxide (MAG-OX) 400 (241.3 Mg) MG tablet Take 1 tablet (400 mg total) by mouth 2 (two) times daily. 60 tablet 9  . ondansetron (ZOFRAN) 8 MG tablet Take 1 tablet (8 mg total) by mouth 2 (two) times daily as needed (Nausea or vomiting). 30 tablet 1  . pantoprazole (PROTONIX) 40 MG tablet Take 1 tablet (40 mg total) by mouth daily. 180 tablet 3  . pomalidomide (POMALYST) 2 MG capsule Take 1 capsule (2 mg total) by mouth daily. Take with water on days 1-21. Repeat every 28 days. 21 capsule 0  . potassium chloride SA (K-DUR,KLOR-CON) 20 MEQ tablet Take 1 tablet (20 mEq total) by mouth 2 (two) times daily. 14 tablet 0   No current facility-administered medications for this visit.     PHYSICAL EXAMINATION: ECOG PERFORMANCE STATUS: 1 - Symptomatic but completely ambulatory  Vitals:   10/27/17 1104  BP: (!) 135/56  Pulse: (!) 49  Resp: 18  Temp: 97.8 F (36.6 C)  SpO2: 100%   Filed Weights   10/27/17 1104  Weight: 153 lb 12.8 oz (69.8 kg)    GENERAL:alert, no distress and comfortable SKIN: skin color, texture, turgor are normal, no rashes or significant  lesions EYES: normal, Conjunctiva are pink and non-injected, sclera clear OROPHARYNX:no exudate, no erythema and lips, buccal mucosa, and tongue normal  NECK: supple, thyroid normal size, non-tender, without nodularity LYMPH:  no palpable lymphadenopathy in the cervical, axillary or inguinal LUNGS: clear to auscultation and percussion with normal breathing effort HEART: regular rate & rhythm and no murmurs and no lower extremity edema ABDOMEN:abdomen soft, non-tender and normal bowel sounds Musculoskeletal:no cyanosis of digits and no clubbing  NEURO: alert & oriented x 3 with fluent speech, no focal motor/sensory deficits  LABORATORY DATA:  I have reviewed the data as listed    Component Value Date/Time   NA 140 10/27/2017 1014   NA 140 01/09/2017 0913   K 4.8 10/27/2017 1014   K  3.7 01/09/2017 0913   CL 109 10/27/2017 1014   CL 107 06/29/2012 1131   CO2 22 10/27/2017 1014   CO2 21 (L) 01/09/2017 0913   GLUCOSE 79 10/27/2017 1014   GLUCOSE 80 01/09/2017 0913   GLUCOSE 143 (H) 06/29/2012 1131   BUN 14 10/27/2017 1014   BUN 9.8 01/09/2017 0913   CREATININE 1.83 (H) 10/27/2017 1014   CREATININE 1.5 (H) 01/09/2017 0913   CALCIUM 8.8 (L) 10/27/2017 1014   CALCIUM 7.5 (L) 01/09/2017 0913   PROT 6.7 10/27/2017 1014   PROT 5.7 (L) 01/09/2017 0913   ALBUMIN 3.3 (L) 10/27/2017 1014   ALBUMIN 2.9 (L) 01/09/2017 0913   AST 14 (L) 10/27/2017 1014   AST 12 01/09/2017 0913   ALT 14 10/27/2017 1014   ALT 10 01/09/2017 0913   ALKPHOS 72 10/27/2017 1014   ALKPHOS 55 01/09/2017 0913   BILITOT 0.6 10/27/2017 1014   BILITOT 0.44 01/09/2017 0913   GFRNONAA 35 (L) 10/27/2017 1014   GFRAA 40 (L) 10/27/2017 1014    No results found for: SPEP, UPEP  Lab Results  Component Value Date   WBC 6.0 10/27/2017   NEUTROABS 1.8 10/27/2017   HGB 9.3 (L) 10/27/2017   HCT 28.3 (L) 10/27/2017   MCV 100.7 (H) 10/27/2017   PLT 251 10/27/2017      Chemistry      Component Value Date/Time   NA 140 10/27/2017 1014   NA 140 01/09/2017 0913   K 4.8 10/27/2017 1014   K 3.7 01/09/2017 0913   CL 109 10/27/2017 1014   CL 107 06/29/2012 1131   CO2 22 10/27/2017 1014   CO2 21 (L) 01/09/2017 0913   BUN 14 10/27/2017 1014   BUN 9.8 01/09/2017 0913   CREATININE 1.83 (H) 10/27/2017 1014   CREATININE 1.5 (H) 01/09/2017 0913      Component Value Date/Time   CALCIUM 8.8 (L) 10/27/2017 1014   CALCIUM 7.5 (L) 01/09/2017 0913   ALKPHOS 72 10/27/2017 1014   ALKPHOS 55 01/09/2017 0913   AST 14 (L) 10/27/2017 1014   AST 12 01/09/2017 0913   ALT 14 10/27/2017 1014   ALT 10 01/09/2017 0913   BILITOT 0.6 10/27/2017 1014   BILITOT 0.44 01/09/2017 0913      All questions were answered. The patient knows to call the clinic with any problems, questions or concerns. No barriers to learning  was detected.  I spent 15 minutes counseling the patient face to face. The total time spent in the appointment was 20 minutes and more than 50% was on counseling and review of test results  Heath Lark, MD 10/28/2017 1:46 PM

## 2017-10-28 NOTE — Telephone Encounter (Signed)
Hi Calvin Mccormick, please make sure he gets it

## 2017-10-28 NOTE — Assessment & Plan Note (Signed)
He has multifactorial anemia, combination of anemia chronic kidney disease and recent blood loss/iron deficiency anemia His hemoglobin and iron studies are stable.  Continue close observation I plan to check iron studies on a regular basis and replace as needed 

## 2017-10-28 NOTE — Assessment & Plan Note (Signed)
I recommend increase oral fluid intake as tolerated We will monitor his kidney function carefully. 

## 2017-10-30 NOTE — Telephone Encounter (Signed)
Called. He is getting Pomalyst deliver today.

## 2017-11-24 ENCOUNTER — Inpatient Hospital Stay: Payer: Medicare Other

## 2017-11-24 ENCOUNTER — Inpatient Hospital Stay (HOSPITAL_BASED_OUTPATIENT_CLINIC_OR_DEPARTMENT_OTHER): Payer: Medicare Other | Admitting: Hematology and Oncology

## 2017-11-24 ENCOUNTER — Inpatient Hospital Stay: Payer: Medicare Other | Attending: Hematology and Oncology

## 2017-11-24 ENCOUNTER — Ambulatory Visit: Payer: Medicare Other | Admitting: Hematology and Oncology

## 2017-11-24 ENCOUNTER — Encounter: Payer: Self-pay | Admitting: Hematology and Oncology

## 2017-11-24 VITALS — BP 123/68 | HR 51 | Resp 18

## 2017-11-24 VITALS — BP 138/58 | HR 56 | Temp 98.1°F | Resp 18 | Ht 71.0 in | Wt 158.4 lb

## 2017-11-24 DIAGNOSIS — Z79899 Other long term (current) drug therapy: Secondary | ICD-10-CM | POA: Insufficient documentation

## 2017-11-24 DIAGNOSIS — N183 Chronic kidney disease, stage 3 unspecified: Secondary | ICD-10-CM

## 2017-11-24 DIAGNOSIS — C9002 Multiple myeloma in relapse: Secondary | ICD-10-CM

## 2017-11-24 DIAGNOSIS — M8580 Other specified disorders of bone density and structure, unspecified site: Secondary | ICD-10-CM | POA: Insufficient documentation

## 2017-11-24 DIAGNOSIS — E876 Hypokalemia: Secondary | ICD-10-CM

## 2017-11-24 DIAGNOSIS — Z9221 Personal history of antineoplastic chemotherapy: Secondary | ICD-10-CM | POA: Diagnosis not present

## 2017-11-24 DIAGNOSIS — Z7982 Long term (current) use of aspirin: Secondary | ICD-10-CM

## 2017-11-24 DIAGNOSIS — Z95828 Presence of other vascular implants and grafts: Secondary | ICD-10-CM

## 2017-11-24 DIAGNOSIS — D631 Anemia in chronic kidney disease: Secondary | ICD-10-CM

## 2017-11-24 DIAGNOSIS — C9001 Multiple myeloma in remission: Secondary | ICD-10-CM

## 2017-11-24 DIAGNOSIS — M791 Myalgia, unspecified site: Secondary | ICD-10-CM

## 2017-11-24 DIAGNOSIS — D539 Nutritional anemia, unspecified: Secondary | ICD-10-CM

## 2017-11-24 DIAGNOSIS — Z5112 Encounter for antineoplastic immunotherapy: Secondary | ICD-10-CM | POA: Diagnosis not present

## 2017-11-24 LAB — COMPREHENSIVE METABOLIC PANEL
ALBUMIN: 3.1 g/dL — AB (ref 3.5–5.0)
ALT: 17 U/L (ref 0–44)
ANION GAP: 4 — AB (ref 5–15)
AST: 17 U/L (ref 15–41)
Alkaline Phosphatase: 68 U/L (ref 38–126)
BUN: 13 mg/dL (ref 8–23)
CALCIUM: 8.9 mg/dL (ref 8.9–10.3)
CO2: 25 mmol/L (ref 22–32)
Chloride: 109 mmol/L (ref 98–111)
Creatinine, Ser: 1.74 mg/dL — ABNORMAL HIGH (ref 0.61–1.24)
GFR calc Af Amer: 43 mL/min — ABNORMAL LOW (ref >60)
GFR calc non Af Amer: 37 mL/min — ABNORMAL LOW (ref >60)
GLUCOSE: 78 mg/dL (ref 70–99)
POTASSIUM: 4.5 mmol/L (ref 3.5–5.1)
SODIUM: 138 mmol/L (ref 135–145)
Total Bilirubin: 0.5 mg/dL (ref 0.3–1.2)
Total Protein: 6.5 g/dL (ref 6.5–8.1)

## 2017-11-24 LAB — IRON AND TIBC
IRON: 68 ug/dL (ref 42–163)
SATURATION RATIOS: 30 % (ref 20–55)
TIBC: 229 ug/dL (ref 202–409)
UIBC: 161 ug/dL (ref 117–376)

## 2017-11-24 LAB — CBC WITH DIFFERENTIAL/PLATELET
Abs Immature Granulocytes: 0.02 10*3/uL (ref 0.00–0.07)
Basophils Absolute: 0.1 10*3/uL (ref 0.0–0.1)
Basophils Relative: 2 %
EOS ABS: 0.3 10*3/uL (ref 0.0–0.5)
EOS PCT: 5 %
HEMATOCRIT: 27.6 % — AB (ref 39.0–52.0)
Hemoglobin: 9.2 g/dL — ABNORMAL LOW (ref 13.0–17.0)
Immature Granulocytes: 0 %
Lymphocytes Relative: 55 %
Lymphs Abs: 3.4 10*3/uL (ref 0.7–4.0)
MCH: 33.3 pg (ref 26.0–34.0)
MCHC: 33.3 g/dL (ref 30.0–36.0)
MCV: 100 fL (ref 80.0–100.0)
MONOS PCT: 10 %
Monocytes Absolute: 0.6 10*3/uL (ref 0.1–1.0)
Neutro Abs: 1.7 10*3/uL (ref 1.7–7.7)
Neutrophils Relative %: 28 %
PLATELETS: 227 10*3/uL (ref 150–400)
RBC: 2.76 MIL/uL — ABNORMAL LOW (ref 4.22–5.81)
RDW: 15 % (ref 11.5–15.5)
WBC: 6.1 10*3/uL (ref 4.0–10.5)
nRBC: 0 % (ref 0.0–0.2)

## 2017-11-24 LAB — MAGNESIUM: Magnesium: 1.6 mg/dL — ABNORMAL LOW (ref 1.7–2.4)

## 2017-11-24 LAB — FERRITIN: FERRITIN: 420 ng/mL — AB (ref 24–336)

## 2017-11-24 MED ORDER — SODIUM CHLORIDE 0.9 % IV SOLN
16.8000 mg/kg | Freq: Once | INTRAVENOUS | Status: AC
  Administered 2017-11-24: 1100 mg via INTRAVENOUS
  Filled 2017-11-24: qty 40

## 2017-11-24 MED ORDER — SODIUM CHLORIDE 0.9 % IV SOLN
Freq: Once | INTRAVENOUS | Status: AC
  Administered 2017-11-24: 13:00:00 via INTRAVENOUS
  Filled 2017-11-24: qty 250

## 2017-11-24 MED ORDER — HEPARIN SOD (PORK) LOCK FLUSH 100 UNIT/ML IV SOLN
500.0000 [IU] | Freq: Once | INTRAVENOUS | Status: AC | PRN
  Administered 2017-11-24: 500 [IU]
  Filled 2017-11-24: qty 5

## 2017-11-24 MED ORDER — DIPHENHYDRAMINE HCL 25 MG PO CAPS
ORAL_CAPSULE | ORAL | Status: AC
  Filled 2017-11-24: qty 2

## 2017-11-24 MED ORDER — PROCHLORPERAZINE MALEATE 10 MG PO TABS
ORAL_TABLET | ORAL | Status: AC
  Filled 2017-11-24: qty 1

## 2017-11-24 MED ORDER — PROCHLORPERAZINE MALEATE 10 MG PO TABS
10.0000 mg | ORAL_TABLET | Freq: Once | ORAL | Status: AC
  Administered 2017-11-24: 10 mg via ORAL

## 2017-11-24 MED ORDER — DIPHENHYDRAMINE HCL 25 MG PO CAPS
50.0000 mg | ORAL_CAPSULE | Freq: Once | ORAL | Status: AC
  Administered 2017-11-24: 50 mg via ORAL

## 2017-11-24 MED ORDER — METHYLPREDNISOLONE SODIUM SUCC 40 MG IJ SOLR
INTRAMUSCULAR | Status: AC
  Filled 2017-11-24: qty 1

## 2017-11-24 MED ORDER — SODIUM CHLORIDE 0.9% FLUSH
10.0000 mL | Freq: Once | INTRAVENOUS | Status: AC
  Administered 2017-11-24: 10 mL
  Filled 2017-11-24: qty 10

## 2017-11-24 MED ORDER — ACETAMINOPHEN 325 MG PO TABS
ORAL_TABLET | ORAL | Status: AC
  Filled 2017-11-24: qty 2

## 2017-11-24 MED ORDER — SODIUM CHLORIDE 0.9% FLUSH
10.0000 mL | INTRAVENOUS | Status: DC | PRN
  Administered 2017-11-24: 10 mL
  Filled 2017-11-24: qty 10

## 2017-11-24 MED ORDER — METHYLPREDNISOLONE SODIUM SUCC 40 MG IJ SOLR
40.0000 mg | Freq: Once | INTRAMUSCULAR | Status: AC
  Administered 2017-11-24: 40 mg via INTRAVENOUS

## 2017-11-24 MED ORDER — ACETAMINOPHEN 325 MG PO TABS
650.0000 mg | ORAL_TABLET | Freq: Once | ORAL | Status: AC
  Administered 2017-11-24: 650 mg via ORAL
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
## 2017-11-24 NOTE — Patient Instructions (Signed)
[facility] Discharge Instructions for Patients Receiving Chemotherapy  Today you received the following chemotherapy agents: Darzalex  To help prevent nausea and vomiting after your treatment, we encourage you to take your nausea medication as directed.    If you develop nausea and vomiting that is not controlled by your nausea medication, call the clinic.   BELOW ARE SYMPTOMS THAT SHOULD BE REPORTED IMMEDIATELY:  *FEVER GREATER THAN 100.5 F  *CHILLS WITH OR WITHOUT FEVER  NAUSEA AND VOMITING THAT IS NOT CONTROLLED WITH YOUR NAUSEA MEDICATION  *UNUSUAL SHORTNESS OF BREATH  *UNUSUAL BRUISING OR BLEEDING  TENDERNESS IN MOUTH AND THROAT WITH OR WITHOUT PRESENCE OF ULCERS  *URINARY PROBLEMS  *BOWEL PROBLEMS  UNUSUAL RASH Items with * indicate a potential emergency and should be followed up as soon as possible.  Feel free to call the clinic should you have any questions or concerns. The clinic phone number is [phone].  Please show the CHEMO ALERT CARD at check-in to the Emergency Department and triage nurse.   

## 2017-11-24 NOTE — Patient Instructions (Signed)
We discussed the results of your labs from today.  Your protein studies and chemistry are not yet available.  Copies of your lab work were given for your review.  You are scheduled for treatment today.  Barring any unforeseen complications, your neck scheduled doctor visit with laboratory studies prior to daratumumab is now scheduled for December 16.  Continue the Pomalyst as previously recommended.  Days 1-21 every 28 days (3 out of 4 weeks).  Please do not hesitate to call should any new or untoward problems arise in the interim.  Happy Thanksgiving!

## 2017-11-25 ENCOUNTER — Telehealth: Payer: Self-pay | Admitting: Hematology and Oncology

## 2017-11-25 LAB — KAPPA/LAMBDA LIGHT CHAINS
KAPPA, LAMDA LIGHT CHAIN RATIO: 22.59 — AB (ref 0.26–1.65)
Kappa free light chain: 255.3 mg/L — ABNORMAL HIGH (ref 3.3–19.4)
LAMDA FREE LIGHT CHAINS: 11.3 mg/L (ref 5.7–26.3)

## 2017-11-25 NOTE — Telephone Encounter (Signed)
Appointment already scheduled for Dec 16 per 11/18 los

## 2017-11-26 LAB — MULTIPLE MYELOMA PANEL, SERUM
ALBUMIN SERPL ELPH-MCNC: 2.9 g/dL (ref 2.9–4.4)
ALPHA 1: 0.2 g/dL (ref 0.0–0.4)
Albumin/Glob SerPl: 1.2 (ref 0.7–1.7)
Alpha2 Glob SerPl Elph-Mcnc: 0.6 g/dL (ref 0.4–1.0)
B-Globulin SerPl Elph-Mcnc: 0.9 g/dL (ref 0.7–1.3)
GAMMA GLOB SERPL ELPH-MCNC: 0.8 g/dL (ref 0.4–1.8)
GLOBULIN, TOTAL: 2.6 g/dL (ref 2.2–3.9)
IGA: 21 mg/dL — AB (ref 61–437)
IGM (IMMUNOGLOBULIN M), SRM: 6 mg/dL — AB (ref 15–143)
IgG (Immunoglobin G), Serum: 1476 mg/dL (ref 700–1600)
M Protein SerPl Elph-Mcnc: 0.7 g/dL — ABNORMAL HIGH
Total Protein ELP: 5.5 g/dL — ABNORMAL LOW (ref 6.0–8.5)

## 2017-12-01 ENCOUNTER — Other Ambulatory Visit: Payer: Self-pay | Admitting: *Deleted

## 2017-12-01 ENCOUNTER — Ambulatory Visit: Payer: Medicare Other | Admitting: Oncology

## 2017-12-01 DIAGNOSIS — Z6823 Body mass index (BMI) 23.0-23.9, adult: Secondary | ICD-10-CM | POA: Diagnosis not present

## 2017-12-01 DIAGNOSIS — M791 Myalgia, unspecified site: Secondary | ICD-10-CM | POA: Diagnosis not present

## 2017-12-01 DIAGNOSIS — C9002 Multiple myeloma in relapse: Secondary | ICD-10-CM

## 2017-12-01 MED ORDER — POMALIDOMIDE 2 MG PO CAPS: 2.0000 mg | ORAL_CAPSULE | Freq: Every day | ORAL | 0 refills | Status: DC

## 2017-12-18 ENCOUNTER — Other Ambulatory Visit: Payer: Self-pay | Admitting: Hematology and Oncology

## 2017-12-18 DIAGNOSIS — C9002 Multiple myeloma in relapse: Secondary | ICD-10-CM

## 2017-12-22 ENCOUNTER — Other Ambulatory Visit: Payer: Medicare Other

## 2017-12-22 ENCOUNTER — Inpatient Hospital Stay (HOSPITAL_BASED_OUTPATIENT_CLINIC_OR_DEPARTMENT_OTHER): Payer: Medicare Other | Admitting: Hematology and Oncology

## 2017-12-22 ENCOUNTER — Ambulatory Visit: Payer: Medicare Other | Admitting: Hematology and Oncology

## 2017-12-22 ENCOUNTER — Ambulatory Visit: Payer: Medicare Other

## 2017-12-22 ENCOUNTER — Inpatient Hospital Stay: Payer: Medicare Other

## 2017-12-22 ENCOUNTER — Telehealth: Payer: Self-pay | Admitting: Hematology and Oncology

## 2017-12-22 ENCOUNTER — Inpatient Hospital Stay: Payer: Medicare Other | Attending: Hematology and Oncology

## 2017-12-22 VITALS — BP 124/64 | HR 51 | Resp 18

## 2017-12-22 DIAGNOSIS — C9002 Multiple myeloma in relapse: Secondary | ICD-10-CM

## 2017-12-22 DIAGNOSIS — D631 Anemia in chronic kidney disease: Secondary | ICD-10-CM

## 2017-12-22 DIAGNOSIS — N183 Chronic kidney disease, stage 3 unspecified: Secondary | ICD-10-CM

## 2017-12-22 DIAGNOSIS — Z5112 Encounter for antineoplastic immunotherapy: Secondary | ICD-10-CM | POA: Insufficient documentation

## 2017-12-22 DIAGNOSIS — Z9221 Personal history of antineoplastic chemotherapy: Secondary | ICD-10-CM

## 2017-12-22 DIAGNOSIS — Z79899 Other long term (current) drug therapy: Secondary | ICD-10-CM

## 2017-12-22 DIAGNOSIS — Z7982 Long term (current) use of aspirin: Secondary | ICD-10-CM

## 2017-12-22 DIAGNOSIS — D539 Nutritional anemia, unspecified: Secondary | ICD-10-CM

## 2017-12-22 DIAGNOSIS — M8580 Other specified disorders of bone density and structure, unspecified site: Secondary | ICD-10-CM

## 2017-12-22 DIAGNOSIS — Z95828 Presence of other vascular implants and grafts: Secondary | ICD-10-CM

## 2017-12-22 LAB — CBC WITH DIFFERENTIAL/PLATELET
Abs Immature Granulocytes: 0.03 10*3/uL (ref 0.00–0.07)
BASOS PCT: 1 %
Basophils Absolute: 0.1 10*3/uL (ref 0.0–0.1)
Eosinophils Absolute: 0.3 10*3/uL (ref 0.0–0.5)
Eosinophils Relative: 5 %
HCT: 27.6 % — ABNORMAL LOW (ref 39.0–52.0)
HEMOGLOBIN: 9.3 g/dL — AB (ref 13.0–17.0)
Immature Granulocytes: 1 %
LYMPHS PCT: 57 %
Lymphs Abs: 3.3 10*3/uL (ref 0.7–4.0)
MCH: 33.3 pg (ref 26.0–34.0)
MCHC: 33.7 g/dL (ref 30.0–36.0)
MCV: 98.9 fL (ref 80.0–100.0)
Monocytes Absolute: 0.5 10*3/uL (ref 0.1–1.0)
Monocytes Relative: 9 %
Neutro Abs: 1.6 10*3/uL — ABNORMAL LOW (ref 1.7–7.7)
Neutrophils Relative %: 27 %
Platelets: 247 10*3/uL (ref 150–400)
RBC: 2.79 MIL/uL — AB (ref 4.22–5.81)
RDW: 15.1 % (ref 11.5–15.5)
WBC: 5.8 10*3/uL (ref 4.0–10.5)
nRBC: 0 % (ref 0.0–0.2)

## 2017-12-22 LAB — IRON AND TIBC
Iron: 54 ug/dL (ref 42–163)
Saturation Ratios: 23 % (ref 20–55)
TIBC: 233 ug/dL (ref 202–409)
UIBC: 179 ug/dL (ref 117–376)

## 2017-12-22 LAB — COMPREHENSIVE METABOLIC PANEL
ALK PHOS: 69 U/L (ref 38–126)
ALT: 18 U/L (ref 0–44)
ANION GAP: 6 (ref 5–15)
AST: 15 U/L (ref 15–41)
Albumin: 3.2 g/dL — ABNORMAL LOW (ref 3.5–5.0)
BILIRUBIN TOTAL: 0.5 mg/dL (ref 0.3–1.2)
BUN: 12 mg/dL (ref 8–23)
CALCIUM: 8.3 mg/dL — AB (ref 8.9–10.3)
CO2: 24 mmol/L (ref 22–32)
Chloride: 109 mmol/L (ref 98–111)
Creatinine, Ser: 1.47 mg/dL — ABNORMAL HIGH (ref 0.61–1.24)
GFR, EST AFRICAN AMERICAN: 54 mL/min — AB (ref >60)
GFR, EST NON AFRICAN AMERICAN: 46 mL/min — AB (ref >60)
Glucose, Bld: 76 mg/dL (ref 70–99)
Potassium: 3.8 mmol/L (ref 3.5–5.1)
SODIUM: 139 mmol/L (ref 135–145)
TOTAL PROTEIN: 6.4 g/dL — AB (ref 6.5–8.1)

## 2017-12-22 LAB — FERRITIN: Ferritin: 298 ng/mL (ref 24–336)

## 2017-12-22 MED ORDER — DIPHENHYDRAMINE HCL 25 MG PO CAPS
ORAL_CAPSULE | ORAL | Status: AC
  Filled 2017-12-22: qty 23

## 2017-12-22 MED ORDER — HEPARIN SOD (PORK) LOCK FLUSH 100 UNIT/ML IV SOLN
500.0000 [IU] | Freq: Once | INTRAVENOUS | Status: AC | PRN
  Administered 2017-12-22: 500 [IU]
  Filled 2017-12-22: qty 5

## 2017-12-22 MED ORDER — SODIUM CHLORIDE 0.9% FLUSH
10.0000 mL | Freq: Once | INTRAVENOUS | Status: AC
  Administered 2017-12-22: 10 mL
  Filled 2017-12-22: qty 10

## 2017-12-22 MED ORDER — SODIUM CHLORIDE 0.9 % IV SOLN
Freq: Once | INTRAVENOUS | Status: AC
  Administered 2017-12-22: 12:00:00 via INTRAVENOUS
  Filled 2017-12-22: qty 250

## 2017-12-22 MED ORDER — PROCHLORPERAZINE MALEATE 10 MG PO TABS
10.0000 mg | ORAL_TABLET | Freq: Once | ORAL | Status: AC
  Administered 2017-12-22: 10 mg via ORAL

## 2017-12-22 MED ORDER — METHYLPREDNISOLONE SODIUM SUCC 40 MG IJ SOLR
INTRAMUSCULAR | Status: AC
  Filled 2017-12-22: qty 1

## 2017-12-22 MED ORDER — DIPHENHYDRAMINE HCL 25 MG PO CAPS
50.0000 mg | ORAL_CAPSULE | Freq: Once | ORAL | Status: AC
  Administered 2017-12-22: 50 mg via ORAL

## 2017-12-22 MED ORDER — ACETAMINOPHEN 325 MG PO TABS
650.0000 mg | ORAL_TABLET | Freq: Once | ORAL | Status: AC
  Administered 2017-12-22: 650 mg via ORAL

## 2017-12-22 MED ORDER — SODIUM CHLORIDE 0.9% FLUSH
10.0000 mL | INTRAVENOUS | Status: DC | PRN
  Administered 2017-12-22: 10 mL
  Filled 2017-12-22: qty 10

## 2017-12-22 MED ORDER — METHYLPREDNISOLONE SODIUM SUCC 40 MG IJ SOLR
40.0000 mg | Freq: Once | INTRAMUSCULAR | Status: AC
  Administered 2017-12-22: 40 mg via INTRAVENOUS

## 2017-12-22 MED ORDER — ACETAMINOPHEN 325 MG PO TABS
ORAL_TABLET | ORAL | Status: AC
  Filled 2017-12-22: qty 2

## 2017-12-22 MED ORDER — SODIUM CHLORIDE 0.9 % IV SOLN
16.8000 mg/kg | Freq: Once | INTRAVENOUS | Status: AC
  Administered 2017-12-22: 1100 mg via INTRAVENOUS
  Filled 2017-12-22: qty 40

## 2017-12-22 MED ORDER — PROCHLORPERAZINE MALEATE 10 MG PO TABS
ORAL_TABLET | ORAL | Status: AC
  Filled 2017-12-22: qty 1

## 2017-12-22 NOTE — Telephone Encounter (Signed)
Gave avs and calendar ° °

## 2017-12-22 NOTE — Patient Instructions (Signed)
[facility] Discharge Instructions for Patients Receiving Chemotherapy  Today you received the following chemotherapy agents: Darzalex  To help prevent nausea and vomiting after your treatment, we encourage you to take your nausea medication as directed.    If you develop nausea and vomiting that is not controlled by your nausea medication, call the clinic.   BELOW ARE SYMPTOMS THAT SHOULD BE REPORTED IMMEDIATELY:  *FEVER GREATER THAN 100.5 F  *CHILLS WITH OR WITHOUT FEVER  NAUSEA AND VOMITING THAT IS NOT CONTROLLED WITH YOUR NAUSEA MEDICATION  *UNUSUAL SHORTNESS OF BREATH  *UNUSUAL BRUISING OR BLEEDING  TENDERNESS IN MOUTH AND THROAT WITH OR WITHOUT PRESENCE OF ULCERS  *URINARY PROBLEMS  *BOWEL PROBLEMS  UNUSUAL RASH Items with * indicate a potential emergency and should be followed up as soon as possible.  Feel free to call the clinic should you have any questions or concerns. The clinic phone number is [phone].  Please show the CHEMO ALERT CARD at check-in to the Emergency Department and triage nurse.   

## 2017-12-23 ENCOUNTER — Encounter: Payer: Self-pay | Admitting: Hematology and Oncology

## 2017-12-23 LAB — KAPPA/LAMBDA LIGHT CHAINS
Kappa free light chain: 254 mg/L — ABNORMAL HIGH (ref 3.3–19.4)
Kappa, lambda light chain ratio: 27.31 — ABNORMAL HIGH (ref 0.26–1.65)
LAMDA FREE LIGHT CHAINS: 9.3 mg/L (ref 5.7–26.3)

## 2017-12-23 NOTE — Assessment & Plan Note (Signed)
I recommend increase oral fluid intake as tolerated We will monitor his kidney function carefully.

## 2017-12-23 NOTE — Assessment & Plan Note (Signed)
He has multifactorial anemia, combination of anemia chronic kidney disease and recent blood loss/iron deficiency anemia His hemoglobin and iron studies are stable.  Continue close observation I plan to check iron studies on a regular basis and replace as needed

## 2017-12-23 NOTE — Assessment & Plan Note (Signed)
His last few myeloma panel fluctuate up and down but overall he is not symptomatic His last light chain measurement is stable In the meantime, he will continue Pomalyst, monthly daratumumab for treatment He will continue acyclovir for antimicrobial prophylaxis along with calcium and vitamin D He will continue aspirin for DVT prophylaxis

## 2017-12-23 NOTE — Progress Notes (Signed)
[place] OFFICE PROGRESS NOTE  Patient Care Team: Lucianne Lei, MD as PCP - General (Family Medicine) Heath Lark, MD as Consulting Physician (Hematology and Oncology) Pleasant, [name], RN as [place] Management Beverely Pace, LCSW as Veterinary surgeon)  ASSESSMENT & PLAN:  Multiple myeloma in relapse [facility]) His last few myeloma panel fluctuate up and down but overall he is not symptomatic His last light chain measurement is stable In the meantime, he will continue Pomalyst, monthly daratumumab for treatment He will continue acyclovir for antimicrobial prophylaxis along with calcium and vitamin D He will continue aspirin for DVT prophylaxis  Chronic renal insufficiency, stage III (moderate) I recommend increase oral fluid intake as tolerated We will monitor his kidney function carefully.  Deficiency anemia He has multifactorial anemia, combination of anemia chronic kidney disease and recent blood loss/iron deficiency anemia His hemoglobin and iron studies are stable.  Continue close observation I plan to check iron studies on a regular basis and replace as needed   Orders Placed This Encounter  Procedures  . Magnesium    Standing Status:   Standing    Number of Occurrences:   2    Standing Expiration Date:   12/24/2018    INTERVAL HISTORY: Please see below for problem oriented charting. He returns for further follow-up and chemotherapy He feels well Denies significant recent diarrhea No recent infection, fever or chills No bone pain The patient denies any recent signs or symptoms of bleeding such as spontaneous epistaxis, hematuria or hematochezia.  SUMMARY OF ONCOLOGIC HISTORY:   Multiple myeloma in relapse ([place])   12/12/2010 Initial Diagnosis    Multiple myeloma    02/16/2014 Imaging    Skeletal survey showed diffuse osteopenia    03/02/2014 Bone Marrow Biopsy    Accession: [id number] BM biopsy showed only  5 % plasma cells. However, the biopsy was difficult and the bone was fragmented during the procedure. Cytogenetics and FISH study is normal    03/03/2014 Procedure    he has port placement    03/10/2014 - 05/19/2014 Chemotherapy    he received elotuzumab and revlimid    05/20/2014 - 02/25/2016 Chemotherapy    Treatment is switched to maintenance Revlimid only. Treatment is stopped due to progressive disease    11/15/2014 Adverse Reaction    He has mild worsening anemia. Does of Revlimid reduced to 5 mg 21 days on, 7 days off    03/06/2016 -  Chemotherapy    He received Daratumumab and Velcade. Velcade is stopped on 07/24/16    08/14/2016 Miscellaneous    Pomalyst is added along with Daratumumab    10/17/2016 Surgery    EGD was performed for coffee-ground emesis. Multiple linear superficial ulcers were noted in the esophagus from 25-35 cm from insertion. Severe esophagitis was noted from 30-35 cm from insertion. Multiple biopsies were obtained. A small hiatal hernia was noted. The gastric cavity contained coffee-ground fluid, after suctioning and lavage, no obvious gastric lesions/erosion/ulceration/erythema was noted. Biopsies were taken from the antrum to rule out H. Pylori. Duodenum bulb appeared unremarkable. A small diverticulum was noted in second portion of the duodenum.      12/06/2016 Imaging    Ct scan abdomen No acute findings.  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  Stable small hiatal hernia.  Colonic diverticulosis, without radiographic evidence of diverticulitis.     REVIEW OF SYSTEMS:   Constitutional: Denies fevers, chills or abnormal weight loss Eyes: Denies  blurriness of vision Ears, nose, mouth, throat, and face: Denies mucositis or sore throat Respiratory: Denies cough, dyspnea or wheezes Cardiovascular: Denies palpitation, chest discomfort or lower extremity swelling Gastrointestinal:  Denies nausea, heartburn or  change in bowel habits Skin: Denies abnormal skin rashes Lymphatics: Denies new lymphadenopathy or easy bruising Neurological:Denies numbness, tingling or new weaknesses Behavioral/Psych: Mood is stable, no new changes  All other systems were reviewed with the patient and are negative.  I have reviewed the past medical history, past surgical history, social history and family history with the patient and they are unchanged from previous note.  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  MEDICATIONS:  Current Outpatient Medications  Medication Sig Dispense Refill  . acyclovir (ZOVIRAX) 400 MG tablet Take 1 tablet (400 mg total) by mouth daily. 60 tablet 11  . allopurinol (ZYLOPRIM) 100 MG tablet Take 100 mg by mouth 2 (two) times daily.     Marland Kitchen amLODipine (NORVASC) 10 MG tablet Take 10 mg by mouth daily.  1  . aspirin EC 81 MG tablet Take 81 mg by mouth daily.    Marland Kitchen atorvastatin (LIPITOR) 20 MG tablet Take 20 mg by mouth at bedtime.     . brimonidine (ALPHAGAN) 0.15 % ophthalmic solution Place 1 drop into the right eye 3 (three) times daily.    . cholecalciferol (VITAMIN D) 1000 UNITS tablet Take 1,000 Units by mouth daily.    . dorzolamide-timolol (COSOPT) 22.3-6.8 MG/ML ophthalmic solution Place 1 drop into both eyes 2 (two) times daily.     . fluorometholone (FML) 0.1 % ophthalmic suspension Place 1 drop into both eyes daily.    Marland Kitchen latanoprost (XALATAN) 0.005 % ophthalmic solution Place 1 drop into the right eye at bedtime.    . lidocaine-prilocaine (EMLA) cream Apply to affected area once 30 g 3  . loperamide (IMODIUM A-D) 2 MG tablet 1 po after each watery BM.  Maximum 6 per 24hrs. 30 tablet PRN  . magnesium oxide (MAG-OX) 400 (241.3 Mg) MG tablet Take 1 tablet (400 mg total) by mouth 2 (two) times daily. 60 tablet 9  . ondansetron (ZOFRAN) 8 MG tablet Take 1 tablet (8 mg total) by mouth 2 (two) times daily as needed (Nausea or vomiting). 30 tablet 1  . pantoprazole (PROTONIX)  40 MG tablet Take 1 tablet (40 mg total) by mouth daily. 180 tablet 3  . pomalidomide (POMALYST) 2 MG capsule Take 1 capsule (2 mg total) by mouth daily. Take with water on days 1-21. Repeat every 28 days. 21 capsule 0  . potassium chloride SA (K-DUR,KLOR-CON) 20 MEQ tablet Take 1 tablet (20 mEq total) by mouth 2 (two) times daily. 14 tablet 0   No current facility-administered medications for this visit.     PHYSICAL EXAMINATION: ECOG PERFORMANCE STATUS: 1 - Symptomatic but completely ambulatory  Vitals:   12/22/17 1139  BP: (!) 144/65  Pulse: (!) 58  Resp: 18  Temp: 97.8 F (36.6 C)  SpO2: 100%   Filed Weights   12/22/17 1139  Weight: 160 lb 12.8 oz (72.9 kg)    GENERAL:alert, no distress and comfortable SKIN: skin color, texture, turgor are normal, no rashes or significant lesions EYES: normal, Conjunctiva are pink and non-injected, sclera clear OROPHARYNX:no exudate, no erythema and lips, buccal mucosa, and tongue normal  NECK: supple, thyroid normal size, non-tender, without nodularity LYMPH:  no palpable lymphadenopathy in the cervical, axillary or inguinal LUNGS: clear to auscultation and percussion with normal breathing effort HEART: regular  rate & rhythm and no murmurs and no lower extremity edema ABDOMEN:abdomen soft, non-tender and normal bowel sounds Musculoskeletal:no cyanosis of digits and no clubbing  NEURO: alert & oriented x 3 with fluent speech, no focal motor/sensory deficits  LABORATORY DATA:  I have reviewed the data as listed    Component Value Date/Time   NA 139 12/22/2017 1039   NA 140 01/09/2017 0913   K 3.8 12/22/2017 1039   K 3.7 01/09/2017 0913   CL 109 12/22/2017 1039   CL 107 06/29/2012 1131   CO2 24 12/22/2017 1039   CO2 21 (L) 01/09/2017 0913   GLUCOSE 76 12/22/2017 1039   GLUCOSE 80 01/09/2017 0913   GLUCOSE 143 (H) 06/29/2012 1131   BUN 12 12/22/2017 1039   BUN 9.8 01/09/2017 0913   CREATININE 1.47 (H) 12/22/2017 1039    CREATININE 1.5 (H) 01/09/2017 0913   CALCIUM 8.3 (L) 12/22/2017 1039   CALCIUM 7.5 (L) 01/09/2017 0913   PROT 6.4 (L) 12/22/2017 1039   PROT 5.7 (L) 01/09/2017 0913   ALBUMIN 3.2 (L) 12/22/2017 1039   ALBUMIN 2.9 (L) 01/09/2017 0913   AST 15 12/22/2017 1039   AST 12 01/09/2017 0913   ALT 18 12/22/2017 1039   ALT 10 01/09/2017 0913   ALKPHOS 69 12/22/2017 1039   ALKPHOS 55 01/09/2017 0913   BILITOT 0.5 12/22/2017 1039   BILITOT 0.44 01/09/2017 0913   GFRNONAA 46 (L) 12/22/2017 1039   GFRAA 54 (L) 12/22/2017 1039    No results found for: SPEP, UPEP  Lab Results  Component Value Date   WBC 5.8 12/22/2017   NEUTROABS 1.6 (L) 12/22/2017   HGB 9.3 (L) 12/22/2017   HCT 27.6 (L) 12/22/2017   MCV 98.9 12/22/2017   PLT 247 12/22/2017      Chemistry      Component Value Date/Time   NA 139 12/22/2017 1039   NA 140 01/09/2017 0913   K 3.8 12/22/2017 1039   K 3.7 01/09/2017 0913   CL 109 12/22/2017 1039   CL 107 06/29/2012 1131   CO2 24 12/22/2017 1039   CO2 21 (L) 01/09/2017 0913   BUN 12 12/22/2017 1039   BUN 9.8 01/09/2017 0913   CREATININE 1.47 (H) 12/22/2017 1039   CREATININE 1.5 (H) 01/09/2017 0913      Component Value Date/Time   CALCIUM 8.3 (L) 12/22/2017 1039   CALCIUM 7.5 (L) 01/09/2017 0913   ALKPHOS 69 12/22/2017 1039   ALKPHOS 55 01/09/2017 0913   AST 15 12/22/2017 1039   AST 12 01/09/2017 0913   ALT 18 12/22/2017 1039   ALT 10 01/09/2017 0913   BILITOT 0.5 12/22/2017 1039   BILITOT 0.44 01/09/2017 0913     All questions were answered. The patient knows to call the clinic with any problems, questions or concerns. No barriers to learning was detected.  I spent 15 minutes counseling the patient face to face. The total time spent in the appointment was 20 minutes and more than 50% was on counseling and review of test results  Heath Lark, MD 12/23/2017 7:12 AM

## 2017-12-25 LAB — MULTIPLE MYELOMA PANEL, SERUM
ALBUMIN SERPL ELPH-MCNC: 3.2 g/dL (ref 2.9–4.4)
ALBUMIN/GLOB SERPL: 1.2 (ref 0.7–1.7)
ALPHA 1: 0.3 g/dL (ref 0.0–0.4)
ALPHA2 GLOB SERPL ELPH-MCNC: 0.7 g/dL (ref 0.4–1.0)
B-Globulin SerPl Elph-Mcnc: 0.7 g/dL (ref 0.7–1.3)
GLOBULIN, TOTAL: 2.9 g/dL (ref 2.2–3.9)
Gamma Glob SerPl Elph-Mcnc: 1.3 g/dL (ref 0.4–1.8)
IGA: 26 mg/dL — AB (ref 61–437)
IGG (IMMUNOGLOBIN G), SERUM: 1583 mg/dL (ref 700–1600)
IGM (IMMUNOGLOBULIN M), SRM: 6 mg/dL — AB (ref 15–143)
M Protein SerPl Elph-Mcnc: 1.1 g/dL — ABNORMAL HIGH
Total Protein ELP: 6.1 g/dL (ref 6.0–8.5)

## 2017-12-29 ENCOUNTER — Other Ambulatory Visit: Payer: Self-pay

## 2017-12-29 DIAGNOSIS — C9002 Multiple myeloma in relapse: Secondary | ICD-10-CM

## 2017-12-29 MED ORDER — POMALIDOMIDE 2 MG PO CAPS: 2.0000 mg | ORAL_CAPSULE | Freq: Every day | ORAL | 0 refills | Status: DC

## 2018-01-06 DIAGNOSIS — M791 Myalgia, unspecified site: Secondary | ICD-10-CM | POA: Diagnosis not present

## 2018-01-19 ENCOUNTER — Telehealth: Payer: Self-pay | Admitting: Hematology and Oncology

## 2018-01-19 ENCOUNTER — Inpatient Hospital Stay: Payer: Medicare Other | Attending: Hematology and Oncology

## 2018-01-19 ENCOUNTER — Inpatient Hospital Stay (HOSPITAL_BASED_OUTPATIENT_CLINIC_OR_DEPARTMENT_OTHER): Payer: Medicare Other | Admitting: Hematology and Oncology

## 2018-01-19 ENCOUNTER — Inpatient Hospital Stay: Payer: Medicare Other

## 2018-01-19 ENCOUNTER — Encounter: Payer: Self-pay | Admitting: Hematology and Oncology

## 2018-01-19 VITALS — BP 140/58 | HR 48 | Resp 14

## 2018-01-19 DIAGNOSIS — Z9221 Personal history of antineoplastic chemotherapy: Secondary | ICD-10-CM

## 2018-01-19 DIAGNOSIS — D631 Anemia in chronic kidney disease: Secondary | ICD-10-CM | POA: Insufficient documentation

## 2018-01-19 DIAGNOSIS — I129 Hypertensive chronic kidney disease with stage 1 through stage 4 chronic kidney disease, or unspecified chronic kidney disease: Secondary | ICD-10-CM | POA: Insufficient documentation

## 2018-01-19 DIAGNOSIS — C9002 Multiple myeloma in relapse: Secondary | ICD-10-CM | POA: Diagnosis not present

## 2018-01-19 DIAGNOSIS — M8580 Other specified disorders of bone density and structure, unspecified site: Secondary | ICD-10-CM | POA: Insufficient documentation

## 2018-01-19 DIAGNOSIS — N183 Chronic kidney disease, stage 3 unspecified: Secondary | ICD-10-CM

## 2018-01-19 DIAGNOSIS — Z5112 Encounter for antineoplastic immunotherapy: Secondary | ICD-10-CM | POA: Insufficient documentation

## 2018-01-19 DIAGNOSIS — Z7982 Long term (current) use of aspirin: Secondary | ICD-10-CM | POA: Diagnosis not present

## 2018-01-19 DIAGNOSIS — Z79899 Other long term (current) drug therapy: Secondary | ICD-10-CM | POA: Diagnosis not present

## 2018-01-19 DIAGNOSIS — I10 Essential (primary) hypertension: Secondary | ICD-10-CM

## 2018-01-19 DIAGNOSIS — Z95828 Presence of other vascular implants and grafts: Secondary | ICD-10-CM

## 2018-01-19 LAB — CBC WITH DIFFERENTIAL/PLATELET
Abs Immature Granulocytes: 0.05 10*3/uL (ref 0.00–0.07)
Basophils Absolute: 0.1 10*3/uL (ref 0.0–0.1)
Basophils Relative: 2 %
Eosinophils Absolute: 0.4 10*3/uL (ref 0.0–0.5)
Eosinophils Relative: 5 %
HCT: 28.2 % — ABNORMAL LOW (ref 39.0–52.0)
Hemoglobin: 9.6 g/dL — ABNORMAL LOW (ref 13.0–17.0)
IMMATURE GRANULOCYTES: 1 %
Lymphocytes Relative: 51 %
Lymphs Abs: 4 10*3/uL (ref 0.7–4.0)
MCH: 33.1 pg (ref 26.0–34.0)
MCHC: 34 g/dL (ref 30.0–36.0)
MCV: 97.2 fL (ref 80.0–100.0)
Monocytes Absolute: 0.7 10*3/uL (ref 0.1–1.0)
Monocytes Relative: 9 %
NEUTROS PCT: 32 %
NRBC: 0 % (ref 0.0–0.2)
Neutro Abs: 2.5 10*3/uL (ref 1.7–7.7)
PLATELETS: 263 10*3/uL (ref 150–400)
RBC: 2.9 MIL/uL — ABNORMAL LOW (ref 4.22–5.81)
RDW: 15 % (ref 11.5–15.5)
WBC: 7.7 10*3/uL (ref 4.0–10.5)

## 2018-01-19 LAB — COMPREHENSIVE METABOLIC PANEL
ALT: 12 U/L (ref 0–44)
AST: 13 U/L — ABNORMAL LOW (ref 15–41)
Albumin: 3.2 g/dL — ABNORMAL LOW (ref 3.5–5.0)
Alkaline Phosphatase: 68 U/L (ref 38–126)
Anion gap: 4 — ABNORMAL LOW (ref 5–15)
BUN: 13 mg/dL (ref 8–23)
CO2: 24 mmol/L (ref 22–32)
Calcium: 8.5 mg/dL — ABNORMAL LOW (ref 8.9–10.3)
Chloride: 109 mmol/L (ref 98–111)
Creatinine, Ser: 1.7 mg/dL — ABNORMAL HIGH (ref 0.61–1.24)
GFR calc Af Amer: 45 mL/min — ABNORMAL LOW (ref >60)
GFR calc non Af Amer: 39 mL/min — ABNORMAL LOW (ref >60)
GLUCOSE: 80 mg/dL (ref 70–99)
Potassium: 4 mmol/L (ref 3.5–5.1)
Sodium: 137 mmol/L (ref 135–145)
Total Bilirubin: 0.5 mg/dL (ref 0.3–1.2)
Total Protein: 6.9 g/dL (ref 6.5–8.1)

## 2018-01-19 LAB — MAGNESIUM: Magnesium: 1.4 mg/dL — CL (ref 1.7–2.4)

## 2018-01-19 MED ORDER — SODIUM CHLORIDE 0.9 % IV SOLN
4.0000 g | Freq: Once | INTRAVENOUS | Status: AC
  Administered 2018-01-19: 4 g via INTRAVENOUS
  Filled 2018-01-19: qty 8

## 2018-01-19 MED ORDER — PROCHLORPERAZINE MALEATE 10 MG PO TABS
ORAL_TABLET | ORAL | Status: AC
  Filled 2018-01-19: qty 1

## 2018-01-19 MED ORDER — HEPARIN SOD (PORK) LOCK FLUSH 100 UNIT/ML IV SOLN
500.0000 [IU] | Freq: Once | INTRAVENOUS | Status: AC | PRN
  Administered 2018-01-19: 500 [IU]
  Filled 2018-01-19: qty 5

## 2018-01-19 MED ORDER — SODIUM CHLORIDE 0.9 % IV SOLN
Freq: Once | INTRAVENOUS | Status: AC
  Administered 2018-01-19: 12:00:00 via INTRAVENOUS
  Filled 2018-01-19: qty 250

## 2018-01-19 MED ORDER — ACETAMINOPHEN 325 MG PO TABS
650.0000 mg | ORAL_TABLET | Freq: Once | ORAL | Status: AC
  Administered 2018-01-19: 650 mg via ORAL

## 2018-01-19 MED ORDER — SODIUM CHLORIDE 0.9% FLUSH
10.0000 mL | Freq: Once | INTRAVENOUS | Status: AC
  Administered 2018-01-19: 10 mL
  Filled 2018-01-19: qty 10

## 2018-01-19 MED ORDER — METHYLPREDNISOLONE SODIUM SUCC 40 MG IJ SOLR
INTRAMUSCULAR | Status: AC
  Filled 2018-01-19: qty 1

## 2018-01-19 MED ORDER — ACETAMINOPHEN 325 MG PO TABS
ORAL_TABLET | ORAL | Status: AC
  Filled 2018-01-19: qty 2

## 2018-01-19 MED ORDER — DIPHENHYDRAMINE HCL 25 MG PO CAPS
50.0000 mg | ORAL_CAPSULE | Freq: Once | ORAL | Status: AC
  Administered 2018-01-19: 50 mg via ORAL

## 2018-01-19 MED ORDER — PROCHLORPERAZINE MALEATE 10 MG PO TABS
10.0000 mg | ORAL_TABLET | Freq: Once | ORAL | Status: AC
  Administered 2018-01-19: 10 mg via ORAL

## 2018-01-19 MED ORDER — SODIUM CHLORIDE 0.9 % IV SOLN: Freq: Once | INTRAVENOUS | Status: DC

## 2018-01-19 MED ORDER — ZOLEDRONIC ACID 4 MG/5ML IV CONC
3.0000 mg | Freq: Once | INTRAVENOUS | Status: AC
  Administered 2018-01-19: 3 mg via INTRAVENOUS
  Filled 2018-01-19: qty 3.75

## 2018-01-19 MED ORDER — DIPHENHYDRAMINE HCL 25 MG PO CAPS
ORAL_CAPSULE | ORAL | Status: AC
  Filled 2018-01-19: qty 2

## 2018-01-19 MED ORDER — SODIUM CHLORIDE 0.9% FLUSH
10.0000 mL | INTRAVENOUS | Status: DC | PRN
  Administered 2018-01-19: 10 mL
  Filled 2018-01-19: qty 10

## 2018-01-19 MED ORDER — METHYLPREDNISOLONE SODIUM SUCC 40 MG IJ SOLR
40.0000 mg | Freq: Once | INTRAMUSCULAR | Status: AC
  Administered 2018-01-19: 40 mg via INTRAVENOUS

## 2018-01-19 MED ORDER — SODIUM CHLORIDE 0.9 % IV SOLN
1100.0000 mg | Freq: Once | INTRAVENOUS | Status: AC
  Administered 2018-01-19: 1100 mg via INTRAVENOUS
  Filled 2018-01-19: qty 40

## 2018-01-19 MED ORDER — HEPARIN SOD (PORK) LOCK FLUSH 100 UNIT/ML IV SOLN
500.0000 [IU] | Freq: Once | INTRAVENOUS | Status: DC
  Filled 2018-01-19: qty 5

## 2018-01-19 NOTE — Progress Notes (Signed)
[place] OFFICE PROGRESS NOTE  Patient Care Team: Lucianne Lei, MD as PCP - General (Family Medicine) Heath Lark, MD as Consulting Physician (Hematology and Oncology) Pleasant, [name], RN as [place] Management Beverely Pace, LCSW as Veterinary surgeon)  ASSESSMENT & PLAN:  Multiple myeloma in relapse [facility]) His last few myeloma panel fluctuate up and down but overall he is not symptomatic His last light chain measurement is stable In the meantime, he will continue Pomalyst, monthly daratumumab for treatment He will continue acyclovir for antimicrobial prophylaxis along with calcium and vitamin D He will continue aspirin for DVT prophylaxis He will also get reduced dose Zometa with treatment every 6 months  Anemia in chronic renal disease He has multifactorial anemia, combination of anemia chronic kidney disease and recent blood loss/iron deficiency anemia His hemoglobin and iron studies are stable.  Continue close observation   Chronic renal insufficiency, stage III (moderate) I recommend increase oral fluid intake as tolerated We will monitor his kidney function carefully.   No orders of the defined types were placed in this encounter.   INTERVAL HISTORY: Please see below for problem oriented charting. He returns with his wife for further follow-up He feels well No recent infection, fever or chills No new bone pain He denies recent new skin rashes He has appointment to see Dr. Dorothyann Gibbs in regards to his dentures.  No jaw pain. The patient denies any recent signs or symptoms of bleeding such as spontaneous epistaxis, hematuria or hematochezia. Denies diarrhea.  SUMMARY OF ONCOLOGIC HISTORY:   Multiple myeloma in relapse ([place])   12/12/2010 Initial Diagnosis    Multiple myeloma    02/16/2014 Imaging    Skeletal survey showed diffuse osteopenia    03/02/2014 Bone Marrow Biopsy    Accession: [id number] BM biopsy  showed only 5 % plasma cells. However, the biopsy was difficult and the bone was fragmented during the procedure. Cytogenetics and FISH study is normal    03/03/2014 Procedure    he has port placement    03/10/2014 - 05/19/2014 Chemotherapy    he received elotuzumab and revlimid    05/20/2014 - 02/25/2016 Chemotherapy    Treatment is switched to maintenance Revlimid only. Treatment is stopped due to progressive disease    11/15/2014 Adverse Reaction    He has mild worsening anemia. Does of Revlimid reduced to 5 mg 21 days on, 7 days off    03/06/2016 -  Chemotherapy    He received Daratumumab and Velcade. Velcade is stopped on 07/24/16    08/14/2016 Miscellaneous    Pomalyst is added along with Daratumumab    10/17/2016 Surgery    EGD was performed for coffee-ground emesis. Multiple linear superficial ulcers were noted in the esophagus from 25-35 cm from insertion. Severe esophagitis was noted from 30-35 cm from insertion. Multiple biopsies were obtained. A small hiatal hernia was noted. The gastric cavity contained coffee-ground fluid, after suctioning and lavage, no obvious gastric lesions/erosion/ulceration/erythema was noted. Biopsies were taken from the antrum to rule out H. Pylori. Duodenum bulb appeared unremarkable. A small diverticulum was noted in second portion of the duodenum.      12/06/2016 Imaging    Ct scan abdomen No acute findings.  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  Stable small hiatal hernia.  Colonic diverticulosis, without radiographic evidence of diverticulitis.     REVIEW OF SYSTEMS:   Constitutional: Denies fevers, chills or abnormal weight loss Eyes: Denies blurriness  of vision Ears, nose, mouth, throat, and face: Denies mucositis or sore throat Respiratory: Denies cough, dyspnea or wheezes Cardiovascular: Denies palpitation, chest discomfort or lower extremity swelling Gastrointestinal:  Denies nausea,  heartburn or change in bowel habits Skin: Denies abnormal skin rashes Lymphatics: Denies new lymphadenopathy or easy bruising Neurological:Denies numbness, tingling or new weaknesses Behavioral/Psych: Mood is stable, no new changes  All other systems were reviewed with the patient and are negative.  I have reviewed the past medical history, past surgical history, social history and family history with the patient and they are unchanged from previous note.  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  MEDICATIONS:  Current Outpatient Medications  Medication Sig Dispense Refill  . acyclovir (ZOVIRAX) 400 MG tablet Take 1 tablet (400 mg total) by mouth daily. 60 tablet 11  . allopurinol (ZYLOPRIM) 100 MG tablet Take 100 mg by mouth 2 (two) times daily.     Marland Kitchen amLODipine (NORVASC) 10 MG tablet Take 10 mg by mouth daily.  1  . aspirin EC 81 MG tablet Take 81 mg by mouth daily.    Marland Kitchen atorvastatin (LIPITOR) 20 MG tablet Take 20 mg by mouth at bedtime.     . brimonidine (ALPHAGAN) 0.15 % ophthalmic solution Place 1 drop into the right eye 3 (three) times daily.    . cholecalciferol (VITAMIN D) 1000 UNITS tablet Take 1,000 Units by mouth daily.    . dorzolamide-timolol (COSOPT) 22.3-6.8 MG/ML ophthalmic solution Place 1 drop into both eyes 2 (two) times daily.     . fluorometholone (FML) 0.1 % ophthalmic suspension Place 1 drop into both eyes daily.    Marland Kitchen latanoprost (XALATAN) 0.005 % ophthalmic solution Place 1 drop into the right eye at bedtime.    . lidocaine-prilocaine (EMLA) cream Apply to affected area once 30 g 3  . loperamide (IMODIUM A-D) 2 MG tablet 1 po after each watery BM.  Maximum 6 per 24hrs. 30 tablet PRN  . magnesium oxide (MAG-OX) 400 (241.3 Mg) MG tablet Take 1 tablet (400 mg total) by mouth 2 (two) times daily. 60 tablet 9  . ondansetron (ZOFRAN) 8 MG tablet Take 1 tablet (8 mg total) by mouth 2 (two) times daily as needed (Nausea or vomiting). 30 tablet 1  . pantoprazole  (PROTONIX) 40 MG tablet Take 1 tablet (40 mg total) by mouth daily. 180 tablet 3  . pomalidomide (POMALYST) 2 MG capsule Take 1 capsule (2 mg total) by mouth daily. Take with water on days 1-21. Repeat every 28 days. 21 capsule 0  . potassium chloride SA (K-DUR,KLOR-CON) 20 MEQ tablet Take 1 tablet (20 mEq total) by mouth 2 (two) times daily. 14 tablet 0   No current facility-administered medications for this visit.     PHYSICAL EXAMINATION: ECOG PERFORMANCE STATUS: 1 - Symptomatic but completely ambulatory  Vitals:   01/19/18 1133  BP: (!) 148/55  Pulse: (!) 57  Resp: 18  Temp: (!) 97.5 F (36.4 C)  SpO2: 100%   Filed Weights   01/19/18 1133  Weight: 161 lb (73 kg)    GENERAL:alert, no distress and comfortable SKIN: skin color, texture, turgor are normal, no rashes or significant lesions EYES: normal, Conjunctiva are pink and non-injected, sclera clear OROPHARYNX:no exudate, no erythema and lips, buccal mucosa, and tongue normal  NECK: supple, thyroid normal size, non-tender, without nodularity LYMPH:  no palpable lymphadenopathy in the cervical, axillary or inguinal LUNGS: clear to auscultation and percussion with normal breathing effort HEART: regular rate &  rhythm and no murmurs and no lower extremity edema ABDOMEN:abdomen soft, non-tender and normal bowel sounds Musculoskeletal:no cyanosis of digits and no clubbing  NEURO: alert & oriented x 3 with fluent speech, no focal motor/sensory deficits  LABORATORY DATA:  I have reviewed the data as listed    Component Value Date/Time   NA 139 12/22/2017 1039   NA 140 01/09/2017 0913   K 3.8 12/22/2017 1039   K 3.7 01/09/2017 0913   CL 109 12/22/2017 1039   CL 107 06/29/2012 1131   CO2 24 12/22/2017 1039   CO2 21 (L) 01/09/2017 0913   GLUCOSE 76 12/22/2017 1039   GLUCOSE 80 01/09/2017 0913   GLUCOSE 143 (H) 06/29/2012 1131   BUN 12 12/22/2017 1039   BUN 9.8 01/09/2017 0913   CREATININE 1.47 (H) 12/22/2017 1039    CREATININE 1.5 (H) 01/09/2017 0913   CALCIUM 8.3 (L) 12/22/2017 1039   CALCIUM 7.5 (L) 01/09/2017 0913   PROT 6.4 (L) 12/22/2017 1039   PROT 5.7 (L) 01/09/2017 0913   ALBUMIN 3.2 (L) 12/22/2017 1039   ALBUMIN 2.9 (L) 01/09/2017 0913   AST 15 12/22/2017 1039   AST 12 01/09/2017 0913   ALT 18 12/22/2017 1039   ALT 10 01/09/2017 0913   ALKPHOS 69 12/22/2017 1039   ALKPHOS 55 01/09/2017 0913   BILITOT 0.5 12/22/2017 1039   BILITOT 0.44 01/09/2017 0913   GFRNONAA 46 (L) 12/22/2017 1039   GFRAA 54 (L) 12/22/2017 1039    No results found for: SPEP, UPEP  Lab Results  Component Value Date   WBC 7.7 01/19/2018   NEUTROABS 2.5 01/19/2018   HGB 9.6 (L) 01/19/2018   HCT 28.2 (L) 01/19/2018   MCV 97.2 01/19/2018   PLT 263 01/19/2018      Chemistry      Component Value Date/Time   NA 139 12/22/2017 1039   NA 140 01/09/2017 0913   K 3.8 12/22/2017 1039   K 3.7 01/09/2017 0913   CL 109 12/22/2017 1039   CL 107 06/29/2012 1131   CO2 24 12/22/2017 1039   CO2 21 (L) 01/09/2017 0913   BUN 12 12/22/2017 1039   BUN 9.8 01/09/2017 0913   CREATININE 1.47 (H) 12/22/2017 1039   CREATININE 1.5 (H) 01/09/2017 0913      Component Value Date/Time   CALCIUM 8.3 (L) 12/22/2017 1039   CALCIUM 7.5 (L) 01/09/2017 0913   ALKPHOS 69 12/22/2017 1039   ALKPHOS 55 01/09/2017 0913   AST 15 12/22/2017 1039   AST 12 01/09/2017 0913   ALT 18 12/22/2017 1039   ALT 10 01/09/2017 0913   BILITOT 0.5 12/22/2017 1039   BILITOT 0.44 01/09/2017 0913       All questions were answered. The patient knows to call the clinic with any problems, questions or concerns. No barriers to learning was detected.  I spent 15 minutes counseling the patient face to face. The total time spent in the appointment was 20 minutes and more than 50% was on counseling and review of test results  Heath Lark, MD 01/19/2018 11:41 AM

## 2018-01-19 NOTE — Assessment & Plan Note (Signed)
He has multifactorial anemia, combination of anemia chronic kidney disease and recent blood loss/iron deficiency anemia His hemoglobin and iron studies are stable.  Continue close observation  

## 2018-01-19 NOTE — Telephone Encounter (Signed)
Per 1/13 patient had another appointment could not schedule 2/10  And he did not want tues. He will cancel other appt that is scheduled

## 2018-01-19 NOTE — Assessment & Plan Note (Signed)
I recommend increase oral fluid intake as tolerated We will monitor his kidney function carefully.

## 2018-01-19 NOTE — Patient Instructions (Addendum)
[facility] Discharge Instructions for Patients Receiving Chemotherapy  Today you received the following chemotherapy agents: Darzalex and Zometa.  To help prevent nausea and vomiting after your treatment, we encourage you to take your nausea medication as directed.   If you develop nausea and vomiting that is not controlled by your nausea medication, call the clinic.   BELOW ARE SYMPTOMS THAT SHOULD BE REPORTED IMMEDIATELY:  *FEVER GREATER THAN 100.5 F  *CHILLS WITH OR WITHOUT FEVER  NAUSEA AND VOMITING THAT IS NOT CONTROLLED WITH YOUR NAUSEA MEDICATION  *UNUSUAL SHORTNESS OF BREATH  *UNUSUAL BRUISING OR BLEEDING  TENDERNESS IN MOUTH AND THROAT WITH OR WITHOUT PRESENCE OF ULCERS  *URINARY PROBLEMS  *BOWEL PROBLEMS  UNUSUAL RASH Items with * indicate a potential emergency and should be followed up as soon as possible.  Feel free to call the clinic should you have any questions or concerns. The clinic phone number is [phone].  Please show the [facility] at check-in to the Emergency Department and triage nurse.

## 2018-01-19 NOTE — Assessment & Plan Note (Addendum)
His last few myeloma panel fluctuate up and down but overall he is not symptomatic His last light chain measurement is stable In the meantime, he will continue Pomalyst, monthly daratumumab for treatment He will continue acyclovir for antimicrobial prophylaxis along with calcium and vitamin D He will continue aspirin for DVT prophylaxis He will also get reduced dose Zometa with treatment every 6 months

## 2018-01-20 LAB — KAPPA/LAMBDA LIGHT CHAINS
Kappa free light chain: 298.2 mg/L — ABNORMAL HIGH (ref 3.3–19.4)
Kappa, lambda light chain ratio: 22.59 — ABNORMAL HIGH (ref 0.26–1.65)
Lambda free light chains: 13.2 mg/L (ref 5.7–26.3)

## 2018-01-21 ENCOUNTER — Encounter: Payer: Self-pay | Admitting: Hematology and Oncology

## 2018-01-21 LAB — MULTIPLE MYELOMA PANEL, SERUM
Albumin SerPl Elph-Mcnc: 3.3 g/dL (ref 2.9–4.4)
Albumin/Glob SerPl: 1.1 (ref 0.7–1.7)
Alpha 1: 0.2 g/dL (ref 0.0–0.4)
Alpha2 Glob SerPl Elph-Mcnc: 0.6 g/dL (ref 0.4–1.0)
B-Globulin SerPl Elph-Mcnc: 0.7 g/dL (ref 0.7–1.3)
Gamma Glob SerPl Elph-Mcnc: 1.5 g/dL (ref 0.4–1.8)
Globulin, Total: 3.1 g/dL (ref 2.2–3.9)
IgA: 27 mg/dL — ABNORMAL LOW (ref 61–437)
IgG (Immunoglobin G), Serum: 1767 mg/dL — ABNORMAL HIGH (ref 700–1600)
IgM (Immunoglobulin M), Srm: 5 mg/dL — ABNORMAL LOW (ref 15–143)
M Protein SerPl Elph-Mcnc: 1.3 g/dL — ABNORMAL HIGH
Total Protein ELP: 6.4 g/dL (ref 6.0–8.5)

## 2018-01-21 NOTE — Progress Notes (Signed)
Pt is approved w/ LLS to receive assistance up to $11,000 to assist w/ ins premiums and/or qualifying treatment expenses effective 01/07/18 to 01/07/19. °

## 2018-01-22 ENCOUNTER — Telehealth: Payer: Self-pay

## 2018-01-22 ENCOUNTER — Other Ambulatory Visit: Payer: Self-pay | Admitting: Hematology and Oncology

## 2018-01-22 DIAGNOSIS — C9002 Multiple myeloma in relapse: Secondary | ICD-10-CM

## 2018-01-22 MED ORDER — POMALIDOMIDE 3 MG PO CAPS: 3.0000 mg | ORAL_CAPSULE | Freq: Every day | ORAL | 11 refills | Status: DC

## 2018-01-22 NOTE — Telephone Encounter (Signed)
Called back and given below message. He verbalized understanding. 

## 2018-01-22 NOTE — Telephone Encounter (Signed)
Called and left a message asking him to call the office. 

## 2018-01-22 NOTE — Telephone Encounter (Signed)
-----   Message from Heath Lark, MD sent at 01/22/2018  8:09 AM EST ----- Regarding: myeloma panel is worse Pls call the patient/wife His panel is worse I recommend increasing pomalyst to 3 mg  I will print a new script

## 2018-01-27 ENCOUNTER — Other Ambulatory Visit: Payer: Self-pay

## 2018-01-27 DIAGNOSIS — C9002 Multiple myeloma in relapse: Secondary | ICD-10-CM

## 2018-01-27 MED ORDER — POMALIDOMIDE 3 MG PO CAPS: 3.0000 mg | ORAL_CAPSULE | Freq: Every day | ORAL | 11 refills | Status: DC

## 2018-02-06 DIAGNOSIS — M791 Myalgia, unspecified site: Secondary | ICD-10-CM | POA: Diagnosis not present

## 2018-02-11 DIAGNOSIS — Z961 Presence of intraocular lens: Secondary | ICD-10-CM | POA: Diagnosis not present

## 2018-02-11 DIAGNOSIS — H4051X2 Glaucoma secondary to other eye disorders, right eye, moderate stage: Secondary | ICD-10-CM | POA: Diagnosis not present

## 2018-02-16 ENCOUNTER — Encounter (HOSPITAL_COMMUNITY): Payer: Self-pay | Admitting: Dentistry

## 2018-02-16 ENCOUNTER — Inpatient Hospital Stay: Payer: Medicare Other | Attending: Hematology and Oncology

## 2018-02-16 ENCOUNTER — Encounter: Payer: Self-pay | Admitting: Hematology and Oncology

## 2018-02-16 ENCOUNTER — Inpatient Hospital Stay (HOSPITAL_BASED_OUTPATIENT_CLINIC_OR_DEPARTMENT_OTHER): Payer: Medicare Other | Admitting: Hematology and Oncology

## 2018-02-16 ENCOUNTER — Telehealth: Payer: Self-pay | Admitting: Hematology and Oncology

## 2018-02-16 ENCOUNTER — Other Ambulatory Visit: Payer: Self-pay | Admitting: Hematology and Oncology

## 2018-02-16 ENCOUNTER — Ambulatory Visit (HOSPITAL_COMMUNITY): Payer: Self-pay | Admitting: Dentistry

## 2018-02-16 ENCOUNTER — Inpatient Hospital Stay: Payer: Medicare Other

## 2018-02-16 VITALS — BP 130/60 | HR 57 | Temp 97.8°F | Resp 16

## 2018-02-16 VITALS — BP 137/65 | HR 57 | Temp 97.7°F

## 2018-02-16 DIAGNOSIS — Z79899 Other long term (current) drug therapy: Secondary | ICD-10-CM | POA: Insufficient documentation

## 2018-02-16 DIAGNOSIS — L853 Xerosis cutis: Secondary | ICD-10-CM

## 2018-02-16 DIAGNOSIS — K0820 Unspecified atrophy of edentulous alveolar ridge: Secondary | ICD-10-CM

## 2018-02-16 DIAGNOSIS — Z95828 Presence of other vascular implants and grafts: Secondary | ICD-10-CM

## 2018-02-16 DIAGNOSIS — C9002 Multiple myeloma in relapse: Secondary | ICD-10-CM

## 2018-02-16 DIAGNOSIS — Z972 Presence of dental prosthetic device (complete) (partial): Principal | ICD-10-CM

## 2018-02-16 DIAGNOSIS — D631 Anemia in chronic kidney disease: Secondary | ICD-10-CM

## 2018-02-16 DIAGNOSIS — N183 Chronic kidney disease, stage 3 unspecified: Secondary | ICD-10-CM

## 2018-02-16 DIAGNOSIS — K08109 Complete loss of teeth, unspecified cause, unspecified class: Secondary | ICD-10-CM

## 2018-02-16 DIAGNOSIS — Z9221 Personal history of antineoplastic chemotherapy: Secondary | ICD-10-CM | POA: Diagnosis not present

## 2018-02-16 DIAGNOSIS — Z5112 Encounter for antineoplastic immunotherapy: Secondary | ICD-10-CM | POA: Diagnosis not present

## 2018-02-16 DIAGNOSIS — M8580 Other specified disorders of bone density and structure, unspecified site: Secondary | ICD-10-CM

## 2018-02-16 DIAGNOSIS — Z7982 Long term (current) use of aspirin: Secondary | ICD-10-CM

## 2018-02-16 DIAGNOSIS — C9001 Multiple myeloma in remission: Secondary | ICD-10-CM

## 2018-02-16 DIAGNOSIS — Z463 Encounter for fitting and adjustment of dental prosthetic device: Secondary | ICD-10-CM

## 2018-02-16 LAB — CBC WITH DIFFERENTIAL/PLATELET
Abs Immature Granulocytes: 0.05 10*3/uL (ref 0.00–0.07)
BASOS ABS: 0.1 10*3/uL (ref 0.0–0.1)
Basophils Relative: 2 %
Eosinophils Absolute: 0.2 10*3/uL (ref 0.0–0.5)
Eosinophils Relative: 4 %
HCT: 32.2 % — ABNORMAL LOW (ref 39.0–52.0)
Hemoglobin: 10.5 g/dL — ABNORMAL LOW (ref 13.0–17.0)
IMMATURE GRANULOCYTES: 1 %
Lymphocytes Relative: 55 %
Lymphs Abs: 3.8 10*3/uL (ref 0.7–4.0)
MCH: 32.4 pg (ref 26.0–34.0)
MCHC: 32.6 g/dL (ref 30.0–36.0)
MCV: 99.4 fL (ref 80.0–100.0)
Monocytes Absolute: 0.6 10*3/uL (ref 0.1–1.0)
Monocytes Relative: 9 %
NRBC: 0 % (ref 0.0–0.2)
Neutro Abs: 1.9 10*3/uL (ref 1.7–7.7)
Neutrophils Relative %: 29 %
Platelets: 243 10*3/uL (ref 150–400)
RBC: 3.24 MIL/uL — ABNORMAL LOW (ref 4.22–5.81)
RDW: 15.5 % (ref 11.5–15.5)
WBC: 6.7 10*3/uL (ref 4.0–10.5)

## 2018-02-16 LAB — COMPREHENSIVE METABOLIC PANEL
ALT: 16 U/L (ref 0–44)
AST: 16 U/L (ref 15–41)
Albumin: 3.8 g/dL (ref 3.5–5.0)
Alkaline Phosphatase: 68 U/L (ref 38–126)
Anion gap: 6 (ref 5–15)
BUN: 16 mg/dL (ref 8–23)
CO2: 23 mmol/L (ref 22–32)
Calcium: 9.3 mg/dL (ref 8.9–10.3)
Chloride: 110 mmol/L (ref 98–111)
Creatinine, Ser: 2.07 mg/dL — ABNORMAL HIGH (ref 0.61–1.24)
GFR calc Af Amer: 35 mL/min — ABNORMAL LOW (ref >60)
GFR calc non Af Amer: 30 mL/min — ABNORMAL LOW (ref >60)
Glucose, Bld: 80 mg/dL (ref 70–99)
Potassium: 4.9 mmol/L (ref 3.5–5.1)
Sodium: 139 mmol/L (ref 135–145)
Total Bilirubin: 0.9 mg/dL (ref 0.3–1.2)
Total Protein: 7.3 g/dL (ref 6.5–8.1)

## 2018-02-16 LAB — MAGNESIUM: Magnesium: 1.5 mg/dL — ABNORMAL LOW (ref 1.7–2.4)

## 2018-02-16 MED ORDER — PROCHLORPERAZINE MALEATE 10 MG PO TABS
ORAL_TABLET | ORAL | Status: AC
  Filled 2018-02-16: qty 1

## 2018-02-16 MED ORDER — ACYCLOVIR 400 MG PO TABS: 400.0000 mg | ORAL_TABLET | Freq: Every day | ORAL | 11 refills | Status: DC

## 2018-02-16 MED ORDER — SODIUM CHLORIDE 0.9 % IV SOLN
16.8000 mg/kg | Freq: Once | INTRAVENOUS | Status: AC
  Administered 2018-02-16: 1100 mg via INTRAVENOUS
  Filled 2018-02-16: qty 40

## 2018-02-16 MED ORDER — METHYLPREDNISOLONE SODIUM SUCC 40 MG IJ SOLR
INTRAMUSCULAR | Status: AC
  Filled 2018-02-16: qty 1

## 2018-02-16 MED ORDER — METHYLPREDNISOLONE SODIUM SUCC 40 MG IJ SOLR
40.0000 mg | Freq: Once | INTRAMUSCULAR | Status: AC
  Administered 2018-02-16: 40 mg via INTRAVENOUS

## 2018-02-16 MED ORDER — SODIUM CHLORIDE 0.9 % IV SOLN
Freq: Once | INTRAVENOUS | Status: AC
  Administered 2018-02-16: 13:00:00 via INTRAVENOUS
  Filled 2018-02-16: qty 250

## 2018-02-16 MED ORDER — ACETAMINOPHEN 325 MG PO TABS
650.0000 mg | ORAL_TABLET | Freq: Once | ORAL | Status: AC
  Administered 2018-02-16: 650 mg via ORAL

## 2018-02-16 MED ORDER — HEPARIN SOD (PORK) LOCK FLUSH 100 UNIT/ML IV SOLN
500.0000 [IU] | Freq: Once | INTRAVENOUS | Status: AC | PRN
  Administered 2018-02-16: 500 [IU]
  Filled 2018-02-16: qty 5

## 2018-02-16 MED ORDER — MAGNESIUM OXIDE 400 (241.3 MG) MG PO TABS: 400.0000 mg | ORAL_TABLET | Freq: Two times a day (BID) | ORAL | 9 refills | Status: DC

## 2018-02-16 MED ORDER — PANTOPRAZOLE SODIUM 40 MG PO TBEC: 40.0000 mg | DELAYED_RELEASE_TABLET | Freq: Every day | ORAL | 3 refills | Status: DC

## 2018-02-16 MED ORDER — DIPHENHYDRAMINE HCL 25 MG PO CAPS
ORAL_CAPSULE | ORAL | Status: AC
  Filled 2018-02-16: qty 2

## 2018-02-16 MED ORDER — SODIUM CHLORIDE 0.9% FLUSH
10.0000 mL | INTRAVENOUS | Status: DC | PRN
  Administered 2018-02-16: 10 mL
  Filled 2018-02-16: qty 10

## 2018-02-16 MED ORDER — ACETAMINOPHEN 325 MG PO TABS
ORAL_TABLET | ORAL | Status: AC
  Filled 2018-02-16: qty 2

## 2018-02-16 MED ORDER — PROCHLORPERAZINE MALEATE 10 MG PO TABS
10.0000 mg | ORAL_TABLET | Freq: Once | ORAL | Status: AC
  Administered 2018-02-16: 10 mg via ORAL

## 2018-02-16 MED ORDER — DIPHENHYDRAMINE HCL 25 MG PO CAPS
50.0000 mg | ORAL_CAPSULE | Freq: Once | ORAL | Status: AC
  Administered 2018-02-16: 50 mg via ORAL

## 2018-02-16 MED ORDER — SODIUM CHLORIDE 0.9% FLUSH
10.0000 mL | Freq: Once | INTRAVENOUS | Status: AC
  Administered 2018-02-16: 10 mL
  Filled 2018-02-16: qty 10

## 2018-02-16 NOTE — Progress Notes (Signed)
02/16/2018  Patient Name:   Calvin Mccormick Date of Birth:   1945/10/16 Medical Record Number: [id number]  BP 137/65 (BP Location: Left Arm)   Pulse (!) 57   Temp 97.7 F (36.5 C)   Calvin Mccormick is a 73 year old male that presents for periodic oral exam and evaluation of upper and lower complete dentures. Patient had extraction of remaining teeth with alveoloplasty on 05/27/2014 as part of a pre-Zometa therapy dental protocol. Upper and lower complete dentures were then fabricated and inserted on 09/01/2014. Patient has been seen for multiple denture adjustment appointment since then. Patient has multiple myeloma that is in relapse. Patient now having Zometa therapy every 12 weeks with Dr. Alvy Bimler. Patient now presents for a periodic oral examination and evaluation of upper and lower complete dentures.  Medical Hx Update:  Past Medical History:  Diagnosis Date  . Anemia 11/27/2010  . Cancer ([place])   . Chronic renal insufficiency, stage III (moderate) ([place]) 06/17/2011   Due to myeloma & HTN  . CKD (chronic kidney disease), stage III ([place]) 02/05/2011  . 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 Diarrhea 06/17/2011   Hospital admission 05/30/11 velcade toxicity? Infectious?  Marland Kitchen Hyperlipemia   . Hypertension, benign essential, goal below 140/90 11/27/2010  . Hypokalemia with normal acid-base balance 07/29/2011  . Hypomagnesemia 07/29/2011  . Seasonal allergies   .  Past Surgical History:  Procedure Laterality Date  . COLONOSCOPY  07/26/2011   Procedure: COLONOSCOPY;  Surgeon: Cleotis Nipper, MD;  Location: [facility];  Service: Endoscopy;  Laterality: N/A;  . ESOPHAGOGASTRODUODENOSCOPY (EGD) WITH PROPOFOL Left 10/17/2016   Procedure: ESOPHAGOGASTRODUODENOSCOPY (EGD) WITH PROPOFOL;  Surgeon: Ronnette Juniper, MD;  Location: [facility];  Service: Gastroenterology;  Laterality: Left;  . EYE SURGERY     bilateral cataract  surgery  . MULTIPLE EXTRACTIONS WITH ALVEOLOPLASTY N/A 05/27/2014   Procedure: Extraction  of tooth #'s 1,6,7,8,10,11,13,21,22,23,24,25,26,27 and 28 with alveoloplasty;  Surgeon: Lenn Cal, DDS;  Location: [facility];  Service: Oral Surgery;  Laterality: N/A;  . porta cath      for chemotherapy- right chest area    ALLERGIES/ADVERSE DRUG REACTIONS: Allergies  Allergen Reactions  . Lactose Intolerance (Gi) Nausea And Vomiting    Pt said he is not ??    MEDICATIONS: Current Outpatient Medications  Medication Sig Dispense Refill  . acyclovir (ZOVIRAX) 400 MG tablet Take 1 tablet (400 mg total) by mouth daily. 60 tablet 11  . allopurinol (ZYLOPRIM) 100 MG tablet Take 100 mg by mouth 2 (two) times daily.     Marland Kitchen amLODipine (NORVASC) 10 MG tablet Take 10 mg by mouth daily.  1  . aspirin EC 81 MG tablet Take 81 mg by mouth daily.    Marland Kitchen atorvastatin (LIPITOR) 20 MG tablet Take 20 mg by mouth at bedtime.     . brimonidine (ALPHAGAN) 0.15 % ophthalmic solution Place 1 drop into the right eye 3 (three) times daily.    . cholecalciferol (VITAMIN D) 1000 UNITS tablet Take 1,000 Units by mouth daily.    . dorzolamide-timolol (COSOPT) 22.3-6.8 MG/ML ophthalmic solution Place 1 drop into both eyes 2 (two) times daily.     . fluorometholone (FML) 0.1 % ophthalmic suspension Place 1 drop into both eyes daily.    Marland Kitchen latanoprost (XALATAN) 0.005 % ophthalmic solution Place 1 drop into the right eye at bedtime.    . lidocaine-prilocaine (EMLA) cream Apply to affected area once 30 g 3  . loperamide (IMODIUM A-D) 2 MG  tablet 1 po after each watery BM.  Maximum 6 per 24hrs. 30 tablet PRN  . magnesium oxide (MAG-OX) 400 (241.3 Mg) MG tablet Take 1 tablet (400 mg total) by mouth 2 (two) times daily. 60 tablet 9  . ondansetron (ZOFRAN) 8 MG tablet Take 1 tablet (8 mg total) by mouth 2 (two) times daily as needed (Nausea or vomiting). 30 tablet 1  . pantoprazole (PROTONIX) 40 MG tablet Take 1 tablet (40 mg total) by mouth daily. 180 tablet 3  . pomalidomide (POMALYST) 3 MG capsule Take 1 capsule (3 mg  total) by mouth daily. Take with water on days 1-21. Repeat every 28 days. 21 capsule 11  . potassium chloride SA (K-DUR,KLOR-CON) 20 MEQ tablet Take 1 tablet (20 mEq total) by mouth 2 (two) times daily. 14 tablet 0   No current facility-administered medications for this visit.     C/C: Evaluation of upper lower complete dentures.   HPI:  Calvin Mccormick is a 73 year old male that presents for periodic oral exam and evaluation of upper and lower complete dentures. Patient had extraction of remaining teeth with alveoloplasty on 05/27/2014 as part of a pre-Zometa therapy dental protocol. Upper and lower complete dentures were then fabricated and inserted on 09/01/2014. Patient has been seen for multiple denture adjustment appointment since then. Patient has multiple myeloma that is in relapse. Patient now having Zometa therapy every 12 weeks with Dr. Alvy Bimler. Patient now presents for a periodic oral examination and evaluation of upper and lower complete dentures.  Patient currently denies having any problems with his upper and lower complete dentures.  DENTAL EXAM: General: Patient is a well-developed, well-nourished male in no acute distress. Vitals: BP 137/65 (BP Location: Left Arm)   Pulse (!) 57   Temp 97.7 F (36.5 C)  Extraoral Exam: There is no palpable lymphadenopathy. The patient denies acute TMJ symptoms. Intraoral  Exam: Patient has normal saliva. There is no evidence of denture irritation. There is atrophy of the edentulous alveolar ridges. There is no evidence of osteonecrosis of the jaw. Dentition: Patient is edentulous. Prosthodontic: Patient has upper and lower complete dentures. The dentures are stable and retentive. Pressure indicating paste was applied to the dentures and adjustments were made as needed. Dentures were polished. Occlusion:  Dentures were minimally adjusted in centric relation and protrusive strokes. Patient with tendency to protrude lower denture to an end to end  position. Patient was again instructed on finding maximum intercuspation postion. Patient expressed understanding.  Assessments: 1. Multiple myeloma in relapse 2. Zometa therapy every 3 months  3. Completely edentulous 4. Atrophy of the edentulous alveolar ridges 5. Upper and lower complete dentures are stable.  Plan:  1. Keep dentures out if sore spots arise. 2. Use salt water rinses as needed to aid healing. 3. Return to clinic as scheduled for denture evaluation. Call if problems arise before then.   Lenn Cal, DDS

## 2018-02-16 NOTE — Progress Notes (Signed)
[facility] OFFICE PROGRESS NOTE  Patient Care Team: Lucianne Lei, MD as PCP - General (Family Medicine) Heath Lark, MD as Consulting Physician (Hematology and Oncology) Pleasant, [name], RN as Melvin, LCSW as Social Worker (General Practice)  ASSESSMENT & PLAN:  Multiple myeloma in relapse [facility]) His recent myeloma panel was a little worse.  I have increased the dose of pomalidomide Repeat myeloma panel is pending We will continue pomalidomide along with monthly daratumumab He will continue acyclovir for antimicrobial prophylaxis He will continue calcium with vitamin D along with Zometa  Anemia in chronic renal disease He has multifactorial anemia, combination of anemia chronic kidney disease and recent blood loss/iron deficiency anemia His hemoglobin and iron studies are stable.  Continue close observation   Chronic renal insufficiency, stage III (moderate) I recommend increase oral fluid intake as tolerated We will monitor his kidney function carefully.  Hypomagnesemia He has recurrent, chronic low magnesium We will continue aggressive magnesium replacement therapy.  Dry skin dermatitis He has persistent dry skin on exam I reinforced the importance of using topical emollient   No orders of the defined types were placed in this encounter.   INTERVAL HISTORY: Please see below for problem oriented charting. He returns for further follow-up He denies recent infection, fever or chills Denies recent diarrhea He denies significant esophagitis The patient denies any recent signs or symptoms of bleeding such as spontaneous epistaxis, hematuria or hematochezia. He saw his dentist recently.  No recent dental issue He tolerated increased dose pomalidomide well No peripheral neuropathy.  No new bone pain  SUMMARY OF ONCOLOGIC HISTORY:   Multiple myeloma in relapse ([place])   12/12/2010 Initial Diagnosis   Multiple myeloma    02/16/2014 Imaging    Skeletal survey showed diffuse osteopenia    03/02/2014 Bone Marrow Biopsy    Accession: [id number] BM biopsy showed only 5 % plasma cells. However, the biopsy was difficult and the bone was fragmented during the procedure. Cytogenetics and FISH study is normal    03/03/2014 Procedure    he has port placement    03/10/2014 - 05/19/2014 Chemotherapy    he received elotuzumab and revlimid    05/20/2014 - 02/25/2016 Chemotherapy    Treatment is switched to maintenance Revlimid only. Treatment is stopped due to progressive disease    11/15/2014 Adverse Reaction    He has mild worsening anemia. Does of Revlimid reduced to 5 mg 21 days on, 7 days off    03/06/2016 -  Chemotherapy    He received Daratumumab and Velcade. Velcade is stopped on 07/24/16    08/14/2016 Miscellaneous    Pomalyst is added along with Daratumumab    10/17/2016 Surgery    EGD was performed for coffee-ground emesis. Multiple linear superficial ulcers were noted in the esophagus from 25-35 cm from insertion. Severe esophagitis was noted from 30-35 cm from insertion. Multiple biopsies were obtained. A small hiatal hernia was noted. The gastric cavity contained coffee-ground fluid, after suctioning and lavage, no obvious gastric lesions/erosion/ulceration/erythema was noted. Biopsies were taken from the antrum to rule out H. Pylori. Duodenum bulb appeared unremarkable. A small diverticulum was noted in second portion of the duodenum.      12/06/2016 Imaging    Ct scan abdomen No acute findings.  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  Stable small hiatal hernia.  Colonic diverticulosis, without radiographic evidence of diverticulitis.     REVIEW OF SYSTEMS:  Constitutional: Denies fevers, chills or abnormal weight loss Eyes: Denies blurriness of vision Ears, nose, mouth, throat, and face: Denies mucositis or sore throat Respiratory:  Denies cough, dyspnea or wheezes Cardiovascular: Denies palpitation, chest discomfort or lower extremity swelling Gastrointestinal:  Denies nausea, heartburn or change in bowel habits Skin: Denies abnormal skin rashes Lymphatics: Denies new lymphadenopathy or easy bruising Neurological:Denies numbness, tingling or new weaknesses Behavioral/Psych: Mood is stable, no new changes  All other systems were reviewed with the patient and are negative.  I have reviewed the past medical history, past surgical history, social history and family history with the patient and they are unchanged from previous note.  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  MEDICATIONS:  Current Outpatient Medications  Medication Sig Dispense Refill  . acyclovir (ZOVIRAX) 400 MG tablet Take 1 tablet (400 mg total) by mouth daily. 60 tablet 11  . allopurinol (ZYLOPRIM) 100 MG tablet Take 100 mg by mouth 2 (two) times daily.     Marland Kitchen amLODipine (NORVASC) 10 MG tablet Take 10 mg by mouth daily.  1  . aspirin EC 81 MG tablet Take 81 mg by mouth daily.    Marland Kitchen atorvastatin (LIPITOR) 20 MG tablet Take 20 mg by mouth at bedtime.     . brimonidine (ALPHAGAN) 0.15 % ophthalmic solution Place 1 drop into the right eye 3 (three) times daily.    . cholecalciferol (VITAMIN D) 1000 UNITS tablet Take 1,000 Units by mouth daily.    . dorzolamide-timolol (COSOPT) 22.3-6.8 MG/ML ophthalmic solution Place 1 drop into both eyes 2 (two) times daily.     . fluorometholone (FML) 0.1 % ophthalmic suspension Place 1 drop into both eyes daily.    Marland Kitchen latanoprost (XALATAN) 0.005 % ophthalmic solution Place 1 drop into the right eye at bedtime.    . lidocaine-prilocaine (EMLA) cream Apply to affected area once 30 g 3  . loperamide (IMODIUM A-D) 2 MG tablet 1 po after each watery BM.  Maximum 6 per 24hrs. 30 tablet PRN  . magnesium oxide (MAG-OX) 400 (241.3 Mg) MG tablet Take 1 tablet (400 mg total) by mouth 2 (two) times daily. 60 tablet 9  .  ondansetron (ZOFRAN) 8 MG tablet Take 1 tablet (8 mg total) by mouth 2 (two) times daily as needed (Nausea or vomiting). 30 tablet 1  . pantoprazole (PROTONIX) 40 MG tablet Take 1 tablet (40 mg total) by mouth daily. 180 tablet 3  . pomalidomide (POMALYST) 3 MG capsule Take 1 capsule (3 mg total) by mouth daily. Take with water on days 1-21. Repeat every 28 days. 21 capsule 11   No current facility-administered medications for this visit.    Facility-Administered Medications Ordered in Other Visits  Medication Dose Route Frequency Provider Last Rate Last Dose  . daratumumab (DARZALEX) 1,100 mg in sodium chloride 0.9 % 445 mL chemo infusion  16.8 mg/kg (Order-Specific) Intravenous Once Alvy Bimler, Paullette Mckain, MD      . heparin lock flush 100 unit/mL  500 Units Intracatheter Once PRN Alvy Bimler, Alaia Lordi, MD      . sodium chloride flush (NS) 0.9 % injection 10 mL  10 mL Intracatheter PRN Alvy Bimler, Presley Summerlin, MD        PHYSICAL EXAMINATION: ECOG PERFORMANCE STATUS: 2 - Symptomatic, <50% confined to bed  Vitals:   02/16/18 1034  BP: (!) 128/56  Pulse: (!) 55  Resp: 18  Temp: (!) 97.3 F (36.3 C)  SpO2: 100%   Filed Weights   02/16/18 1034  Weight: 160  lb 3.2 oz (72.7 kg)    GENERAL:alert, no distress and comfortable SKIN: skin color, texture, turgor are normal, no rashes or significant lesions.  He has very dry skin EYES: normal, Conjunctiva are pink and non-injected, sclera clear OROPHARYNX:no exudate, no erythema and lips, buccal mucosa, and tongue normal  NECK: supple, thyroid normal size, non-tender, without nodularity LYMPH:  no palpable lymphadenopathy in the cervical, axillary or inguinal LUNGS: clear to auscultation and percussion with normal breathing effort HEART: regular rate & rhythm and no murmurs and no lower extremity edema ABDOMEN:abdomen soft, non-tender and normal bowel sounds Musculoskeletal:no cyanosis of digits and no clubbing  NEURO: alert & oriented x 3 with fluent speech, no focal  motor/sensory deficits  LABORATORY DATA:  I have reviewed the data as listed    Component Value Date/Time   NA 139 02/16/2018 0952   NA 140 01/09/2017 0913   K 4.9 02/16/2018 0952   K 3.7 01/09/2017 0913   CL 110 02/16/2018 0952   CL 107 06/29/2012 1131   CO2 23 02/16/2018 0952   CO2 21 (L) 01/09/2017 0913   GLUCOSE 80 02/16/2018 0952   GLUCOSE 80 01/09/2017 0913   GLUCOSE 143 (H) 06/29/2012 1131   BUN 16 02/16/2018 0952   BUN 9.8 01/09/2017 0913   CREATININE 2.07 (H) 02/16/2018 0952   CREATININE 1.5 (H) 01/09/2017 0913   CALCIUM 9.3 02/16/2018 0952   CALCIUM 7.5 (L) 01/09/2017 0913   PROT 7.3 02/16/2018 0952   PROT 5.7 (L) 01/09/2017 0913   ALBUMIN 3.8 02/16/2018 0952   ALBUMIN 2.9 (L) 01/09/2017 0913   AST 16 02/16/2018 0952   AST 12 01/09/2017 0913   ALT 16 02/16/2018 0952   ALT 10 01/09/2017 0913   ALKPHOS 68 02/16/2018 0952   ALKPHOS 55 01/09/2017 0913   BILITOT 0.9 02/16/2018 0952   BILITOT 0.44 01/09/2017 0913   GFRNONAA 30 (L) 02/16/2018 0952   GFRAA 35 (L) 02/16/2018 0952    No results found for: SPEP, UPEP  Lab Results  Component Value Date   WBC 6.7 02/16/2018   NEUTROABS 1.9 02/16/2018   HGB 10.5 (L) 02/16/2018   HCT 32.2 (L) 02/16/2018   MCV 99.4 02/16/2018   PLT 243 02/16/2018      Chemistry      Component Value Date/Time   NA 139 02/16/2018 0952   NA 140 01/09/2017 0913   K 4.9 02/16/2018 0952   K 3.7 01/09/2017 0913   CL 110 02/16/2018 0952   CL 107 06/29/2012 1131   CO2 23 02/16/2018 0952   CO2 21 (L) 01/09/2017 0913   BUN 16 02/16/2018 0952   BUN 9.8 01/09/2017 0913   CREATININE 2.07 (H) 02/16/2018 0952   CREATININE 1.5 (H) 01/09/2017 0913      Component Value Date/Time   CALCIUM 9.3 02/16/2018 0952   CALCIUM 7.5 (L) 01/09/2017 0913   ALKPHOS 68 02/16/2018 0952   ALKPHOS 55 01/09/2017 0913   AST 16 02/16/2018 0952   AST 12 01/09/2017 0913   ALT 16 02/16/2018 0952   ALT 10 01/09/2017 0913   BILITOT 0.9 02/16/2018 0952    BILITOT 0.44 01/09/2017 0913       All questions were answered. The patient knows to call the clinic with any problems, questions or concerns. No barriers to learning was detected.  I spent 25 minutes counseling the patient face to face. The total time spent in the appointment was 30 minutes and more than 50% was on counseling and  review of test results  Heath Lark, MD 02/16/2018 1:12 PM

## 2018-02-16 NOTE — Assessment & Plan Note (Signed)
He has multifactorial anemia, combination of anemia chronic kidney disease and recent blood loss/iron deficiency anemia His hemoglobin and iron studies are stable.  Continue close observation  

## 2018-02-16 NOTE — Assessment & Plan Note (Signed)
His recent myeloma panel was a little worse.  I have increased the dose of pomalidomide Repeat myeloma panel is pending We will continue pomalidomide along with monthly daratumumab He will continue acyclovir for antimicrobial prophylaxis He will continue calcium with vitamin D along with Zometa

## 2018-02-16 NOTE — Assessment & Plan Note (Signed)
He has persistent dry skin on exam I reinforced the importance of using topical emollient

## 2018-02-16 NOTE — Assessment & Plan Note (Signed)
I recommend increase oral fluid intake as tolerated We will monitor his kidney function carefully.

## 2018-02-16 NOTE — Patient Instructions (Signed)
Cancer Center Discharge Instructions for Patients Receiving Chemotherapy  Today you received the following chemotherapy agents: Darzalex  To help prevent nausea and vomiting after your treatment, we encourage you to take your nausea medication as directed.    If you develop nausea and vomiting that is not controlled by your nausea medication, call the clinic.   BELOW ARE SYMPTOMS THAT SHOULD BE REPORTED IMMEDIATELY:  *FEVER GREATER THAN 100.5 F  *CHILLS WITH OR WITHOUT FEVER  NAUSEA AND VOMITING THAT IS NOT CONTROLLED WITH YOUR NAUSEA MEDICATION  *UNUSUAL SHORTNESS OF BREATH  *UNUSUAL BRUISING OR BLEEDING  TENDERNESS IN MOUTH AND THROAT WITH OR WITHOUT PRESENCE OF ULCERS  *URINARY PROBLEMS  *BOWEL PROBLEMS  UNUSUAL RASH Items with * indicate a potential emergency and should be followed up as soon as possible.  Feel free to call the clinic should you have any questions or concerns. The clinic phone number is [phone].  Please show the CHEMO ALERT CARD at check-in to the Emergency Department and triage nurse.   

## 2018-02-16 NOTE — Patient Instructions (Signed)
Plan:  1. Keep dentures out if sore spots arise. 2. Use salt water rinses as needed to aid healing. 3. Return to clinic as scheduled for denture evaluation. Call if problems arise before then.   Lenn Cal, DDS

## 2018-02-16 NOTE — Assessment & Plan Note (Signed)
He has recurrent, chronic low magnesium We will continue aggressive magnesium replacement therapy.

## 2018-02-16 NOTE — Telephone Encounter (Signed)
Gave avs and calendar ° °

## 2018-02-17 ENCOUNTER — Other Ambulatory Visit: Payer: Self-pay

## 2018-02-17 DIAGNOSIS — C9002 Multiple myeloma in relapse: Secondary | ICD-10-CM

## 2018-02-17 LAB — MULTIPLE MYELOMA PANEL, SERUM
ALBUMIN SERPL ELPH-MCNC: 3.7 g/dL (ref 2.9–4.4)
Albumin/Glob SerPl: 1.1 (ref 0.7–1.7)
Alpha 1: 0.3 g/dL (ref 0.0–0.4)
Alpha2 Glob SerPl Elph-Mcnc: 0.8 g/dL (ref 0.4–1.0)
B-Globulin SerPl Elph-Mcnc: 0.8 g/dL (ref 0.7–1.3)
Gamma Glob SerPl Elph-Mcnc: 1.5 g/dL (ref 0.4–1.8)
Globulin, Total: 3.4 g/dL (ref 2.2–3.9)
IgA: 27 mg/dL — ABNORMAL LOW (ref 61–437)
IgG (Immunoglobin G), Serum: 1693 mg/dL — ABNORMAL HIGH (ref 700–1600)
IgM (Immunoglobulin M), Srm: 15 mg/dL (ref 15–143)
M Protein SerPl Elph-Mcnc: 1.3 g/dL — ABNORMAL HIGH
Total Protein ELP: 7.1 g/dL (ref 6.0–8.5)

## 2018-02-17 LAB — KAPPA/LAMBDA LIGHT CHAINS
Kappa free light chain: 321.3 mg/L — ABNORMAL HIGH (ref 3.3–19.4)
Kappa, lambda light chain ratio: 27.23 — ABNORMAL HIGH (ref 0.26–1.65)
Lambda free light chains: 11.8 mg/L (ref 5.7–26.3)

## 2018-02-17 MED ORDER — POMALIDOMIDE 3 MG PO CAPS: 3.0000 mg | ORAL_CAPSULE | Freq: Every day | ORAL | 11 refills | Status: DC

## 2018-02-18 ENCOUNTER — Telehealth: Payer: Self-pay

## 2018-02-18 NOTE — Telephone Encounter (Signed)
-----   Message from Heath Lark, MD sent at 02/17/2018  4:47 PM EST ----- Regarding: myeloma panel is stable Pls let him know his results are stable ----- Message ----- From: Interface, Lab In [place] Sent: 02/16/2018  10:15 AM EST To: Heath Lark, MD

## 2018-02-18 NOTE — Telephone Encounter (Signed)
Called and left below message. Ask them to call if they have questions.

## 2018-03-16 ENCOUNTER — Inpatient Hospital Stay: Payer: Medicare Other

## 2018-03-16 ENCOUNTER — Other Ambulatory Visit: Payer: Self-pay

## 2018-03-16 ENCOUNTER — Encounter: Payer: Self-pay | Admitting: Hematology and Oncology

## 2018-03-16 ENCOUNTER — Inpatient Hospital Stay: Payer: Medicare Other | Attending: Hematology and Oncology

## 2018-03-16 ENCOUNTER — Telehealth: Payer: Self-pay | Admitting: Hematology and Oncology

## 2018-03-16 ENCOUNTER — Inpatient Hospital Stay (HOSPITAL_BASED_OUTPATIENT_CLINIC_OR_DEPARTMENT_OTHER): Payer: Medicare Other | Admitting: Hematology and Oncology

## 2018-03-16 VITALS — BP 125/71 | HR 51 | Resp 16

## 2018-03-16 DIAGNOSIS — N183 Chronic kidney disease, stage 3 unspecified: Secondary | ICD-10-CM

## 2018-03-16 DIAGNOSIS — C9002 Multiple myeloma in relapse: Secondary | ICD-10-CM

## 2018-03-16 DIAGNOSIS — D631 Anemia in chronic kidney disease: Secondary | ICD-10-CM | POA: Diagnosis not present

## 2018-03-16 DIAGNOSIS — Z5112 Encounter for antineoplastic immunotherapy: Secondary | ICD-10-CM | POA: Diagnosis not present

## 2018-03-16 DIAGNOSIS — Z95828 Presence of other vascular implants and grafts: Secondary | ICD-10-CM

## 2018-03-16 DIAGNOSIS — N2889 Other specified disorders of kidney and ureter: Secondary | ICD-10-CM

## 2018-03-16 LAB — CBC WITH DIFFERENTIAL/PLATELET
Abs Immature Granulocytes: 0.05 10*3/uL (ref 0.00–0.07)
BASOS PCT: 1 %
Basophils Absolute: 0.1 10*3/uL (ref 0.0–0.1)
Eosinophils Absolute: 0.4 10*3/uL (ref 0.0–0.5)
Eosinophils Relative: 5 %
HCT: 30.1 % — ABNORMAL LOW (ref 39.0–52.0)
Hemoglobin: 9.9 g/dL — ABNORMAL LOW (ref 13.0–17.0)
IMMATURE GRANULOCYTES: 1 %
Lymphocytes Relative: 49 %
Lymphs Abs: 3.7 10*3/uL (ref 0.7–4.0)
MCH: 33 pg (ref 26.0–34.0)
MCHC: 32.9 g/dL (ref 30.0–36.0)
MCV: 100.3 fL — AB (ref 80.0–100.0)
MONOS PCT: 9 %
Monocytes Absolute: 0.6 10*3/uL (ref 0.1–1.0)
NEUTROS PCT: 35 %
Neutro Abs: 2.5 10*3/uL (ref 1.7–7.7)
Platelets: 235 10*3/uL (ref 150–400)
RBC: 3 MIL/uL — ABNORMAL LOW (ref 4.22–5.81)
RDW: 15.5 % (ref 11.5–15.5)
WBC: 7.4 10*3/uL (ref 4.0–10.5)
nRBC: 0 % (ref 0.0–0.2)

## 2018-03-16 LAB — COMPREHENSIVE METABOLIC PANEL
ALT: 14 U/L (ref 0–44)
AST: 12 U/L — AB (ref 15–41)
Albumin: 3.4 g/dL — ABNORMAL LOW (ref 3.5–5.0)
Alkaline Phosphatase: 62 U/L (ref 38–126)
Anion gap: 9 (ref 5–15)
BUN: 15 mg/dL (ref 8–23)
CO2: 20 mmol/L — ABNORMAL LOW (ref 22–32)
Calcium: 8.7 mg/dL — ABNORMAL LOW (ref 8.9–10.3)
Chloride: 111 mmol/L (ref 98–111)
Creatinine, Ser: 1.74 mg/dL — ABNORMAL HIGH (ref 0.61–1.24)
GFR calc Af Amer: 43 mL/min — ABNORMAL LOW (ref >60)
GFR calc non Af Amer: 38 mL/min — ABNORMAL LOW (ref >60)
Glucose, Bld: 70 mg/dL (ref 70–99)
Potassium: 4.5 mmol/L (ref 3.5–5.1)
Sodium: 140 mmol/L (ref 135–145)
Total Bilirubin: 0.7 mg/dL (ref 0.3–1.2)
Total Protein: 6.9 g/dL (ref 6.5–8.1)

## 2018-03-16 MED ORDER — POMALIDOMIDE 3 MG PO CAPS: 3.0000 mg | ORAL_CAPSULE | Freq: Every day | ORAL | 11 refills | Status: DC

## 2018-03-16 MED ORDER — SODIUM CHLORIDE 0.9% FLUSH
10.0000 mL | INTRAVENOUS | Status: DC | PRN
  Administered 2018-03-16: 10 mL
  Filled 2018-03-16: qty 10

## 2018-03-16 MED ORDER — ACETAMINOPHEN 325 MG PO TABS
ORAL_TABLET | ORAL | Status: AC
  Filled 2018-03-16: qty 2

## 2018-03-16 MED ORDER — ACETAMINOPHEN 325 MG PO TABS
650.0000 mg | ORAL_TABLET | Freq: Once | ORAL | Status: AC
  Administered 2018-03-16: 650 mg via ORAL

## 2018-03-16 MED ORDER — HEPARIN SOD (PORK) LOCK FLUSH 100 UNIT/ML IV SOLN
500.0000 [IU] | Freq: Once | INTRAVENOUS | Status: AC | PRN
  Administered 2018-03-16: 500 [IU]
  Filled 2018-03-16: qty 5

## 2018-03-16 MED ORDER — METHYLPREDNISOLONE SODIUM SUCC 40 MG IJ SOLR
40.0000 mg | Freq: Once | INTRAMUSCULAR | Status: AC
  Administered 2018-03-16: 40 mg via INTRAVENOUS

## 2018-03-16 MED ORDER — DIPHENHYDRAMINE HCL 25 MG PO CAPS
50.0000 mg | ORAL_CAPSULE | Freq: Once | ORAL | Status: AC
  Administered 2018-03-16: 50 mg via ORAL

## 2018-03-16 MED ORDER — DIPHENHYDRAMINE HCL 25 MG PO CAPS
ORAL_CAPSULE | ORAL | Status: AC
  Filled 2018-03-16: qty 2

## 2018-03-16 MED ORDER — SODIUM CHLORIDE 0.9 % IV SOLN
16.8000 mg/kg | Freq: Once | INTRAVENOUS | Status: AC
  Administered 2018-03-16: 1100 mg via INTRAVENOUS
  Filled 2018-03-16: qty 40

## 2018-03-16 MED ORDER — SODIUM CHLORIDE 0.9% FLUSH
10.0000 mL | Freq: Once | INTRAVENOUS | Status: AC
  Administered 2018-03-16: 10 mL
  Filled 2018-03-16: qty 10

## 2018-03-16 MED ORDER — PROCHLORPERAZINE MALEATE 10 MG PO TABS
ORAL_TABLET | ORAL | Status: AC
  Filled 2018-03-16: qty 1

## 2018-03-16 MED ORDER — PROCHLORPERAZINE MALEATE 10 MG PO TABS
10.0000 mg | ORAL_TABLET | Freq: Once | ORAL | Status: AC
  Administered 2018-03-16: 10 mg via ORAL

## 2018-03-16 MED ORDER — METHYLPREDNISOLONE SODIUM SUCC 40 MG IJ SOLR
INTRAMUSCULAR | Status: AC
  Filled 2018-03-16: qty 1

## 2018-03-16 MED ORDER — SODIUM CHLORIDE 0.9 % IV SOLN
Freq: Once | INTRAVENOUS | Status: AC
  Administered 2018-03-16: 14:00:00 via INTRAVENOUS
  Filled 2018-03-16: qty 250

## 2018-03-16 NOTE — Assessment & Plan Note (Addendum)
I recommend increase oral fluid intake as tolerated We will monitor his kidney function carefully. We will proceed regardless of creatinine function with daratumumab 

## 2018-03-16 NOTE — Assessment & Plan Note (Signed)
He has multifactorial anemia, combination of anemia chronic kidney disease and recent blood loss/iron deficiency anemia His hemoglobin and iron studies are stable.  Continue close observation  

## 2018-03-16 NOTE — Patient Instructions (Signed)
[facility] Discharge Instructions for Patients Receiving Chemotherapy  Today you received the following chemotherapy agents: Darzalex  To help prevent nausea and vomiting after your treatment, we encourage you to take your nausea medication as directed.    If you develop nausea and vomiting that is not controlled by your nausea medication, call the clinic.   BELOW ARE SYMPTOMS THAT SHOULD BE REPORTED IMMEDIATELY:  *FEVER GREATER THAN 100.5 F  *CHILLS WITH OR WITHOUT FEVER  NAUSEA AND VOMITING THAT IS NOT CONTROLLED WITH YOUR NAUSEA MEDICATION  *UNUSUAL SHORTNESS OF BREATH  *UNUSUAL BRUISING OR BLEEDING  TENDERNESS IN MOUTH AND THROAT WITH OR WITHOUT PRESENCE OF ULCERS  *URINARY PROBLEMS  *BOWEL PROBLEMS  UNUSUAL RASH Items with * indicate a potential emergency and should be followed up as soon as possible.  Feel free to call the clinic should you have any questions or concerns. The clinic phone number is [phone].  Please show the CHEMO ALERT CARD at check-in to the Emergency Department and triage nurse.   

## 2018-03-16 NOTE — Telephone Encounter (Signed)
Gave avs and calendar ° °

## 2018-03-16 NOTE — Progress Notes (Signed)
[place] OFFICE PROGRESS NOTE  Patient Care Team: Lucianne Lei, MD as PCP - General (Family Medicine) Heath Lark, MD as Consulting Physician (Hematology and Oncology) Pleasant, [name], RN as [place], LCSW as Social Worker (General Practice)  ASSESSMENT & PLAN:  Multiple myeloma in relapse [facility]) His recent myeloma panel was stable.  I have increased the dose of pomalidomide Repeat myeloma panel from today is pending We will continue pomalidomide along with monthly daratumumab He will continue acyclovir for antimicrobial prophylaxis He will continue calcium with vitamin D along with Zometa  Anemia in chronic renal disease He has multifactorial anemia, combination of anemia chronic kidney disease and recent blood loss/iron deficiency anemia His hemoglobin and iron studies are stable.  Continue close observation   Chronic renal insufficiency, stage III (moderate) I recommend increase oral fluid intake as tolerated We will monitor his kidney function carefully. We will proceed regardless of creatinine function with daratumumab  Hypomagnesemia He has recurrent, chronic low magnesium We will continue aggressive magnesium replacement therapy.   No orders of the defined types were placed in this encounter.   INTERVAL HISTORY: Please see below for problem oriented charting. He returns for further follow-up He denies recent infection, fever or chills No infusion reaction He has no difficulties getting Pomalyst refill No recent dental issues The patient denies any recent signs or symptoms of bleeding such as spontaneous epistaxis, hematuria or hematochezia.   SUMMARY OF ONCOLOGIC HISTORY:   Multiple myeloma in relapse ([place])   12/12/2010 Initial Diagnosis    Multiple myeloma    02/16/2014 Imaging    Skeletal survey showed diffuse osteopenia    03/02/2014 Bone Marrow Biopsy    Accession: [id number] BM biopsy  showed only 5 % plasma cells. However, the biopsy was difficult and the bone was fragmented during the procedure. Cytogenetics and FISH study is normal    03/03/2014 Procedure    he has port placement    03/10/2014 - 05/19/2014 Chemotherapy    he received elotuzumab and revlimid    05/20/2014 - 02/25/2016 Chemotherapy    Treatment is switched to maintenance Revlimid only. Treatment is stopped due to progressive disease    11/15/2014 Adverse Reaction    He has mild worsening anemia. Does of Revlimid reduced to 5 mg 21 days on, 7 days off    03/06/2016 -  Chemotherapy    He received Daratumumab and Velcade. Velcade is stopped on 07/24/16    08/14/2016 Miscellaneous    Pomalyst is added along with Daratumumab    10/17/2016 Surgery    EGD was performed for coffee-ground emesis. Multiple linear superficial ulcers were noted in the esophagus from 25-35 cm from insertion. Severe esophagitis was noted from 30-35 cm from insertion. Multiple biopsies were obtained. A small hiatal hernia was noted. The gastric cavity contained coffee-ground fluid, after suctioning and lavage, no obvious gastric lesions/erosion/ulceration/erythema was noted. Biopsies were taken from the antrum to rule out H. Pylori. Duodenum bulb appeared unremarkable. A small diverticulum was noted in second portion of the duodenum.      12/06/2016 Imaging    Ct scan abdomen No acute findings.  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  Stable small hiatal hernia.  Colonic diverticulosis, without radiographic evidence of diverticulitis.     REVIEW OF SYSTEMS:   Constitutional: Denies fevers, chills or abnormal weight loss Eyes: Denies blurriness of vision Ears, nose, mouth, throat, and face: Denies mucositis or sore throat  Respiratory: Denies cough, dyspnea or wheezes Cardiovascular: Denies palpitation, chest discomfort  Gastrointestinal:  Denies nausea, heartburn or change in bowel  habits Skin: Denies abnormal skin rashes Lymphatics: Denies new lymphadenopathy or easy bruising Neurological:Denies numbness, tingling or new weaknesses Behavioral/Psych: Mood is stable, no new changes  All other systems were reviewed with the patient and are negative.  I have reviewed the past medical history, past surgical history, social history and family history with the patient and they are unchanged from previous note.  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  MEDICATIONS:  Current Outpatient Medications  Medication Sig Dispense Refill  . acyclovir (ZOVIRAX) 400 MG tablet Take 1 tablet (400 mg total) by mouth daily. 60 tablet 11  . allopurinol (ZYLOPRIM) 100 MG tablet Take 100 mg by mouth 2 (two) times daily.     Marland Kitchen amLODipine (NORVASC) 10 MG tablet Take 10 mg by mouth daily.  1  . aspirin EC 81 MG tablet Take 81 mg by mouth daily.    Marland Kitchen atorvastatin (LIPITOR) 20 MG tablet Take 20 mg by mouth at bedtime.     . brimonidine (ALPHAGAN) 0.15 % ophthalmic solution Place 1 drop into the right eye 3 (three) times daily.    . cholecalciferol (VITAMIN D) 1000 UNITS tablet Take 1,000 Units by mouth daily.    . dorzolamide-timolol (COSOPT) 22.3-6.8 MG/ML ophthalmic solution Place 1 drop into both eyes 2 (two) times daily.     . fluorometholone (FML) 0.1 % ophthalmic suspension Place 1 drop into both eyes daily.    Marland Kitchen latanoprost (XALATAN) 0.005 % ophthalmic solution Place 1 drop into the right eye at bedtime.    . lidocaine-prilocaine (EMLA) cream Apply to affected area once 30 g 3  . loperamide (IMODIUM A-D) 2 MG tablet 1 po after each watery BM.  Maximum 6 per 24hrs. 30 tablet PRN  . magnesium oxide (MAG-OX) 400 (241.3 Mg) MG tablet Take 1 tablet (400 mg total) by mouth 2 (two) times daily. 60 tablet 9  . ondansetron (ZOFRAN) 8 MG tablet Take 1 tablet (8 mg total) by mouth 2 (two) times daily as needed (Nausea or vomiting). 30 tablet 1  . pantoprazole (PROTONIX) 40 MG tablet Take  1 tablet (40 mg total) by mouth daily. 180 tablet 3  . pomalidomide (POMALYST) 3 MG capsule Take 1 capsule (3 mg total) by mouth daily. Take with water on days 1-21. Repeat every 28 days. 21 capsule 11   No current facility-administered medications for this visit.     PHYSICAL EXAMINATION: ECOG PERFORMANCE STATUS: 1 - Symptomatic but completely ambulatory  Vitals:   03/16/18 1252  BP: 135/60  Pulse: (!) 56  Resp: 18  Temp: (!) 97.5 F (36.4 C)  SpO2: 100%   Filed Weights   03/16/18 1252  Weight: 165 lb 6.4 oz (75 kg)    GENERAL:alert, no distress and comfortable SKIN: skin color, texture, turgor are normal, no rashes or significant lesions EYES: normal, Conjunctiva are pink and non-injected, sclera clear OROPHARYNX:no exudate, no erythema and lips, buccal mucosa, and tongue normal  NECK: supple, thyroid normal size, non-tender, without nodularity LYMPH:  no palpable lymphadenopathy in the cervical, axillary or inguinal LUNGS: clear to auscultation and percussion with normal breathing effort HEART: regular rate & rhythm and no murmurs with chronic bilateral lower extremity edema ABDOMEN:abdomen soft, non-tender and normal bowel sounds Musculoskeletal:no cyanosis of digits and no clubbing  NEURO: alert & oriented x 3 with fluent speech, no focal motor/sensory deficits  LABORATORY DATA:  I have reviewed the data as listed    Component Value Date/Time   NA 139 02/16/2018 0952   NA 140 01/09/2017 0913   K 4.9 02/16/2018 0952   K 3.7 01/09/2017 0913   CL 110 02/16/2018 0952   CL 107 06/29/2012 1131   CO2 23 02/16/2018 0952   CO2 21 (L) 01/09/2017 0913   GLUCOSE 80 02/16/2018 0952   GLUCOSE 80 01/09/2017 0913   GLUCOSE 143 (H) 06/29/2012 1131   BUN 16 02/16/2018 0952   BUN 9.8 01/09/2017 0913   CREATININE 2.07 (H) 02/16/2018 0952   CREATININE 1.5 (H) 01/09/2017 0913   CALCIUM 9.3 02/16/2018 0952   CALCIUM 7.5 (L) 01/09/2017 0913   PROT 7.3 02/16/2018 0952   PROT 5.7  (L) 01/09/2017 0913   ALBUMIN 3.8 02/16/2018 0952   ALBUMIN 2.9 (L) 01/09/2017 0913   AST 16 02/16/2018 0952   AST 12 01/09/2017 0913   ALT 16 02/16/2018 0952   ALT 10 01/09/2017 0913   ALKPHOS 68 02/16/2018 0952   ALKPHOS 55 01/09/2017 0913   BILITOT 0.9 02/16/2018 0952   BILITOT 0.44 01/09/2017 0913   GFRNONAA 30 (L) 02/16/2018 0952   GFRAA 35 (L) 02/16/2018 0952    No results found for: SPEP, UPEP  Lab Results  Component Value Date   WBC 7.4 03/16/2018   NEUTROABS 2.5 03/16/2018   HGB 9.9 (L) 03/16/2018   HCT 30.1 (L) 03/16/2018   MCV 100.3 (H) 03/16/2018   PLT 235 03/16/2018      Chemistry      Component Value Date/Time   NA 139 02/16/2018 0952   NA 140 01/09/2017 0913   K 4.9 02/16/2018 0952   K 3.7 01/09/2017 0913   CL 110 02/16/2018 0952   CL 107 06/29/2012 1131   CO2 23 02/16/2018 0952   CO2 21 (L) 01/09/2017 0913   BUN 16 02/16/2018 0952   BUN 9.8 01/09/2017 0913   CREATININE 2.07 (H) 02/16/2018 0952   CREATININE 1.5 (H) 01/09/2017 0913      Component Value Date/Time   CALCIUM 9.3 02/16/2018 0952   CALCIUM 7.5 (L) 01/09/2017 0913   ALKPHOS 68 02/16/2018 0952   ALKPHOS 55 01/09/2017 0913   AST 16 02/16/2018 0952   AST 12 01/09/2017 0913   ALT 16 02/16/2018 0952   ALT 10 01/09/2017 0913   BILITOT 0.9 02/16/2018 0952   BILITOT 0.44 01/09/2017 0913       All questions were answered. The patient knows to call the clinic with any problems, questions or concerns. No barriers to learning was detected.  I spent 15 minutes counseling the patient face to face. The total time spent in the appointment was 20 minutes and more than 50% was on counseling and review of test results  Heath Lark, MD 03/16/2018 12:58 PM

## 2018-03-16 NOTE — Telephone Encounter (Signed)
Gave avs and caelndar

## 2018-03-16 NOTE — Assessment & Plan Note (Signed)
His recent myeloma panel was stable.  I have increased the dose of pomalidomide Repeat myeloma panel from today is pending We will continue pomalidomide along with monthly daratumumab He will continue acyclovir for antimicrobial prophylaxis He will continue calcium with vitamin D along with Zometa

## 2018-03-16 NOTE — Assessment & Plan Note (Signed)
He has recurrent, chronic low magnesium We will continue aggressive magnesium replacement therapy.

## 2018-03-17 LAB — KAPPA/LAMBDA LIGHT CHAINS
Kappa free light chain: 280.5 mg/L — ABNORMAL HIGH (ref 3.3–19.4)
Kappa, lambda light chain ratio: 22.62 — ABNORMAL HIGH (ref 0.26–1.65)
Lambda free light chains: 12.4 mg/L (ref 5.7–26.3)

## 2018-03-18 LAB — MULTIPLE MYELOMA PANEL, SERUM
ALBUMIN/GLOB SERPL: 1.1 (ref 0.7–1.7)
Albumin SerPl Elph-Mcnc: 3.3 g/dL (ref 2.9–4.4)
Alpha 1: 0.3 g/dL (ref 0.0–0.4)
Alpha2 Glob SerPl Elph-Mcnc: 0.7 g/dL (ref 0.4–1.0)
B-Globulin SerPl Elph-Mcnc: 0.8 g/dL (ref 0.7–1.3)
Gamma Glob SerPl Elph-Mcnc: 1.3 g/dL (ref 0.4–1.8)
Globulin, Total: 3.1 g/dL (ref 2.2–3.9)
IgA: 27 mg/dL — ABNORMAL LOW (ref 61–437)
IgG (Immunoglobin G), Serum: 1469 mg/dL (ref 700–1600)
IgM (Immunoglobulin M), Srm: 7 mg/dL — ABNORMAL LOW (ref 15–143)
M Protein SerPl Elph-Mcnc: 1.2 g/dL — ABNORMAL HIGH
Total Protein ELP: 6.4 g/dL (ref 6.0–8.5)

## 2018-03-19 ENCOUNTER — Other Ambulatory Visit: Payer: Self-pay

## 2018-03-19 ENCOUNTER — Encounter (HOSPITAL_COMMUNITY): Payer: Self-pay | Admitting: Emergency Medicine

## 2018-03-19 ENCOUNTER — Emergency Department (HOSPITAL_COMMUNITY)
Admission: EM | Admit: 2018-03-19 | Discharge: 2018-03-19 | Disposition: A | Discharge status: Home / Self Care | Payer: Medicare Other | Attending: Emergency Medicine | Admitting: Emergency Medicine

## 2018-03-19 ENCOUNTER — Emergency Department (HOSPITAL_COMMUNITY): Payer: Medicare Other

## 2018-03-19 DIAGNOSIS — R110 Nausea: Secondary | ICD-10-CM | POA: Insufficient documentation

## 2018-03-19 DIAGNOSIS — R112 Nausea with vomiting, unspecified: Secondary | ICD-10-CM | POA: Diagnosis not present

## 2018-03-19 LAB — CBC
HCT: 28.9 % — ABNORMAL LOW (ref 39.0–52.0)
Hemoglobin: 9.4 g/dL — ABNORMAL LOW (ref 13.0–17.0)
MCH: 32.8 pg (ref 26.0–34.0)
MCHC: 32.5 g/dL (ref 30.0–36.0)
MCV: 100.7 fL — ABNORMAL HIGH (ref 80.0–100.0)
Platelets: 243 10*3/uL (ref 150–400)
RBC: 2.87 MIL/uL — AB (ref 4.22–5.81)
RDW: 15.3 % (ref 11.5–15.5)
WBC: 6.4 10*3/uL (ref 4.0–10.5)
nRBC: 0 % (ref 0.0–0.2)

## 2018-03-19 LAB — COMPREHENSIVE METABOLIC PANEL
ALT: 23 U/L (ref 0–44)
ANION GAP: 7 (ref 5–15)
AST: 17 U/L (ref 15–41)
Albumin: 3.7 g/dL (ref 3.5–5.0)
Alkaline Phosphatase: 54 U/L (ref 38–126)
BUN: 22 mg/dL (ref 8–23)
CO2: 22 mmol/L (ref 22–32)
Calcium: 8.7 mg/dL — ABNORMAL LOW (ref 8.9–10.3)
Chloride: 105 mmol/L (ref 98–111)
Creatinine, Ser: 1.64 mg/dL — ABNORMAL HIGH (ref 0.61–1.24)
GFR calc Af Amer: 47 mL/min — ABNORMAL LOW (ref >60)
GFR calc non Af Amer: 40 mL/min — ABNORMAL LOW (ref >60)
GLUCOSE: 94 mg/dL (ref 70–99)
Potassium: 3.7 mmol/L (ref 3.5–5.1)
Sodium: 134 mmol/L — ABNORMAL LOW (ref 135–145)
TOTAL PROTEIN: 6.7 g/dL (ref 6.5–8.1)
Total Bilirubin: 1.1 mg/dL (ref 0.3–1.2)

## 2018-03-19 LAB — URINALYSIS, ROUTINE W REFLEX MICROSCOPIC
Bilirubin Urine: NEGATIVE
Glucose, UA: NEGATIVE mg/dL
Hgb urine dipstick: NEGATIVE
Ketones, ur: NEGATIVE mg/dL
Leukocytes,Ua: NEGATIVE
Nitrite: NEGATIVE
Protein, ur: NEGATIVE mg/dL
Specific Gravity, Urine: 1.015 (ref 1.005–1.030)
pH: 5 (ref 5.0–8.0)

## 2018-03-19 LAB — LIPASE, BLOOD: Lipase: 49 U/L (ref 11–51)

## 2018-03-19 LAB — MAGNESIUM: Magnesium: 1.2 mg/dL — ABNORMAL LOW (ref 1.7–2.4)

## 2018-03-19 MED ORDER — SODIUM CHLORIDE 0.9% FLUSH
3.0000 mL | Freq: Once | INTRAVENOUS | Status: AC
  Administered 2018-03-19: 3 mL via INTRAVENOUS

## 2018-03-19 MED ORDER — ACETAMINOPHEN 500 MG PO TABS
1000.0000 mg | ORAL_TABLET | Freq: Once | ORAL | Status: AC
  Administered 2018-03-19: 1000 mg via ORAL
  Filled 2018-03-19: qty 2

## 2018-03-19 MED ORDER — ONDANSETRON HCL 4 MG PO TABS: 4.0000 mg | ORAL_TABLET | Freq: Four times a day (QID) | ORAL | 0 refills | Status: DC

## 2018-03-19 MED ORDER — MAGNESIUM SULFATE IN D5W 1-5 GM/100ML-% IV SOLN
1.0000 g | Freq: Once | INTRAVENOUS | Status: AC
  Administered 2018-03-19: 1 g via INTRAVENOUS
  Filled 2018-03-19: qty 100

## 2018-03-19 MED ORDER — HEPARIN SOD (PORK) LOCK FLUSH 100 UNIT/ML IV SOLN
500.0000 [IU] | Freq: Once | INTRAVENOUS | Status: AC
  Administered 2018-03-19: 500 [IU]
  Filled 2018-03-19: qty 5

## 2018-03-19 NOTE — Discharge Instructions (Signed)
Please return for any problem. Follow up with your regular physician as instructed.  °

## 2018-03-19 NOTE — ED Triage Notes (Addendum)
Patient here from home with complaints of n/v that started today. Also reports bilateral leg swelling. And bilateral hip pain that been "ongoing". Active chemo patient.

## 2018-03-19 NOTE — ED Provider Notes (Signed)
[place] DEPT Provider Note   CSN: [id number] Arrival date & time: 03/19/18  1801    History   Chief Complaint Chief Complaint  Patient presents with  . Nausea  . Emesis  . Leg Swelling  . Hip Pain    HPI Calvin Mccormick is a 73 y.o. male.     73 year old male presents with complaint of nausea and vomiting.  Patient reports he had an episode of nausea with some vomiting earlier today.  His symptoms have improved since.  He also complains of some mild bilateral lower extremity edema.  This appears to be chronic in nature.  The history is provided by the patient and medical records.  Emesis  Severity:  Mild Duration:  4 hours Timing:  Intermittent Number of daily episodes:  1 Chronicity:  New Recent urination:  Normal Relieved by:  Nothing Worsened by:  Nothing Ineffective treatments:  None tried Hip Pain     Past Medical History:  Diagnosis Date  . Anemia 11/27/2010  . Cancer ([place])   . Chronic renal insufficiency, stage III (moderate) ([place]) 06/17/2011   Due to myeloma & HTN  . CKD (chronic kidney disease), stage III ([place]) 02/05/2011  . 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 Diarrhea 06/17/2011   Hospital admission 05/30/11 velcade toxicity? Infectious?  Marland Kitchen Hyperlipemia   . Hypertension, benign essential, goal below 140/90 11/27/2010  . Hypokalemia with normal acid-base balance 07/29/2011  . Hypomagnesemia 07/29/2011  . Seasonal allergies     Patient Active Problem List   Diagnosis Date Noted  . Iron deficiency anemia 02/06/2017  . Dry skin dermatitis 02/06/2017  . Esophagitis, acute 12/12/2016  . Acute blood loss anemia 10/18/2016  . Aspiration pneumonia ([place]) 10/16/2016  . AKI (acute kidney injury) ([place]) 10/16/2016  . Upper GI bleed 10/16/2016  . Gait instability 05/22/2016  . Physical debility 05/22/2016  . Pancytopenia, acquired ([place]) 03/27/2016  . Goals of care, counseling/discussion 02/25/2016  . Port catheter in place  01/15/2016  . Acute neck pain 11/23/2015  . Renal failure 04/08/2015  . Acute renal failure (ARF) ([place]) 04/08/2015  . Acute renal failure superimposed on stage 3 chronic kidney disease ([place]) 04/08/2015  . Nausea and vomiting 04/08/2015  . Hypocalcemia 04/08/2015  . Pancytopenia due to antineoplastic chemotherapy ([place]) 11/15/2014  . 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 Protein calorie malnutrition ([place]) 10/06/2014  . Neuropathy due to chemotherapeutic drug ([place]) 02/21/2014  . Cellulitis and abscess of hand 08/05/2011  . Hypomagnesemia 07/29/2011  . Hypokalemia with normal acid-base balance 07/29/2011  . HTN (hypertension) 07/24/2011  . Anemia in chronic renal disease 07/24/2011  . Diarrhea 06/17/2011  . Chronic renal insufficiency, stage III (moderate) ([place]) 06/17/2011  . Hypokalemia 05/30/2011  . CKD (chronic kidney disease), stage III ([place]) 02/05/2011  . Multiple myeloma in relapse ([place]) 12/12/2010  . 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 COLONOSCOPY  07/26/2011   Procedure: COLONOSCOPY;  Surgeon: Cleotis Nipper, MD;  Location: [facility] ENDOSCOPY;  Service: Endoscopy;  Laterality: N/A;  . ESOPHAGOGASTRODUODENOSCOPY (EGD) WITH PROPOFOL Left 10/17/2016   Procedure: ESOPHAGOGASTRODUODENOSCOPY (EGD) WITH PROPOFOL;  Surgeon: Ronnette Juniper, MD;  Location: [facility] ENDOSCOPY;  Service: Gastroenterology;  Laterality: Left;  . EYE SURGERY     bilateral cataract  surgery  . MULTIPLE EXTRACTIONS WITH ALVEOLOPLASTY N/A 05/27/2014   Procedure: Extraction of tooth #'s 1,6,7,8,10,11,13,21,22,23,24,25,26,27 and 28 with alveoloplasty;  Surgeon: Lenn Cal, DDS;  Location: [facility] ORS;  Service: Oral Surgery;  Laterality: N/A;  . porta cath      for chemotherapy- right chest area        Home Medications    Prior to Admission medications   Medication Sig Start Date End Date Taking? Authorizing Provider  acyclovir (ZOVIRAX) 400 MG tablet Take 1 tablet  (400 mg total) by mouth daily. 02/16/18  Yes Gorsuch, Ni, MD  allopurinol (ZYLOPRIM) 100 MG tablet Take 100 mg by mouth 2 (two) times daily.  12/06/12  Yes [provider]  amLODipine (NORVASC) 10 MG tablet Take 10 mg by mouth daily. 07/19/16  Yes [provider]  aspirin EC 81 MG tablet Take 81 mg by mouth daily.   Yes [provider]  atorvastatin (LIPITOR) 20 MG tablet Take 20 mg by mouth at bedtime.    Yes [provider]  brimonidine (ALPHAGAN) 0.2 % ophthalmic solution Place 1 drop into the right eye 3 (three) times daily.  02/02/18  Yes [provider]  calcium carbonate (TUMS - DOSED IN MG ELEMENTAL CALCIUM) 500 MG chewable tablet Chew 1 tablet by mouth daily as needed for indigestion or heartburn.   Yes [provider]  cholecalciferol (VITAMIN D) 1000 UNITS tablet Take 1,000 Units by mouth daily.   Yes [provider]  dorzolamide-timolol (COSOPT) 22.3-6.8 MG/ML ophthalmic solution Place 1 drop into the right eye 2 (two) times daily.  07/29/13  Yes [provider]  fluorometholone (FML) 0.1 % ophthalmic suspension Place 1 drop into the right eye daily.    Yes [provider]  latanoprost (XALATAN) 0.005 % ophthalmic solution Place 1 drop into the right eye at bedtime.   Yes [provider]  lidocaine-prilocaine (EMLA) cream Apply to affected area once 02/22/16  Yes Gorsuch, Ni, MD  magnesium oxide (MAG-OX) 400 (241.3 Mg) MG tablet Take 1 tablet (400 mg total) by mouth 2 (two) times daily. 02/16/18  Yes Gorsuch, Ni, MD  ondansetron (ZOFRAN) 8 MG tablet Take 1 tablet (8 mg total) by mouth 2 (two) times daily as needed (Nausea or vomiting). 04/03/16  Yes Gorsuch, Ni, MD  pomalidomide (POMALYST) 3 MG capsule Take 1 capsule (3 mg total) by mouth daily. Take with water on days 1-21. Repeat every 28 days. 03/16/18  Yes Gorsuch, Ni, MD  loperamide (IMODIUM A-D) 2 MG tablet 1 po after each watery BM.  Maximum 6 per  24hrs. Patient not taking: Reported on 03/19/2018 10/01/11   Annia Belt, MD  ondansetron (ZOFRAN) 4 MG tablet Take 1 tablet (4 mg total) by mouth every 6 (six) hours. 03/19/18   Valarie Merino, MD  pantoprazole (PROTONIX) 40 MG tablet Take 1 tablet (40 mg total) by mouth daily. Patient not taking: Reported on 03/19/2018 02/16/18   Heath Lark, MD    Family History Family History  Problem Relation Age of Onset  . Cancer Brother        lung ca    Social History Social History   Tobacco Use  . Smoking status: Never Smoker  . Smokeless tobacco: Never Used  Substance Use Topics  . Alcohol use: No  . Drug use: No     Allergies   Lactose intolerance (gi)   Review of Systems Review of Systems  Gastrointestinal: Positive for vomiting.  All other systems reviewed and are negative.    Physical Exam Updated Vital Signs BP (!) 142/67   Pulse (!) 51   Temp 97.9 F (36.6 C)   Resp 15  SpO2 100%   Physical Exam Vitals signs and nursing note reviewed.  Constitutional:      General: He is not in acute distress.    Appearance: Normal appearance. He is well-developed.  HENT:     Head: Normocephalic and atraumatic.     Mouth/Throat:     Mouth: Mucous membranes are moist.  Eyes:     Conjunctiva/sclera: Conjunctivae normal.     Pupils: Pupils are equal, round, and reactive to light.  Neck:     Musculoskeletal: Normal range of motion and neck supple.  Cardiovascular:     Rate and Rhythm: Normal rate and regular rhythm.     Heart sounds: Normal heart sounds.  Pulmonary:     Effort: Pulmonary effort is normal. No respiratory distress.     Breath sounds: Normal breath sounds.  Abdominal:     General: Abdomen is flat. There is no distension.     Palpations: Abdomen is soft.     Tenderness: There is no abdominal tenderness.  Musculoskeletal: Normal range of motion.        General: No deformity.     Right lower leg: Edema present.     Left lower leg: Edema present.   Skin:    General: Skin is warm and dry.  Neurological:     General: No focal deficit present.     Mental Status: He is alert and oriented to person, place, and time. Mental status is at baseline.     Cranial Nerves: No cranial nerve deficit.     Sensory: No sensory deficit.     Motor: No weakness.     Coordination: Coordination normal.     Gait: Gait normal.     Deep Tendon Reflexes: Reflexes normal.      ED Treatments / Results  Labs (all labs ordered are listed, but only abnormal results are displayed) Labs Reviewed  COMPREHENSIVE METABOLIC PANEL - Abnormal; Notable for the following components:      Result Value   Sodium 134 (*)    Creatinine, Ser 1.64 (*)    Calcium 8.7 (*)    GFR calc non Af Amer 40 (*)    GFR calc Af Amer 47 (*)    All other components within normal limits  CBC - Abnormal; Notable for the following components:   RBC 2.87 (*)    Hemoglobin 9.4 (*)    HCT 28.9 (*)    MCV 100.7 (*)    All other components within normal limits  MAGNESIUM - Abnormal; Notable for the following components:   Magnesium 1.2 (*)    All other components within normal limits  LIPASE, BLOOD  URINALYSIS, ROUTINE W REFLEX MICROSCOPIC    EKG None  Radiology Dg Chest Port 1 View  Result Date: 03/19/2018 CLINICAL DATA:  Nausea and vomiting. EXAM: PORTABLE CHEST 1 VIEW COMPARISON:  09/30/2017 FINDINGS: 1840 hours. The cardiopericardial silhouette is within normal limits for size. The lungs are clear without focal pneumonia, edema, pneumothorax or pleural effusion. Right Port-A-Cath tip overlies distal SVC. The visualized bony structures of the thorax are intact. IMPRESSION: No acute findings. Electronically Signed   By: Misty Stanley M.D.   On: 03/19/2018 19:01    Procedures Procedures (including critical care time)  Medications Ordered in ED Medications  sodium chloride flush (NS) 0.9 % injection 3 mL (3 mLs Intravenous Given 03/19/18 1927)  magnesium sulfate IVPB 1 g 100  mL (0 g Intravenous Stopped 03/19/18 2122)  acetaminophen (TYLENOL) tablet 1,000 mg (1,000  mg Oral Given 03/19/18 2129)     Initial Impression / Assessment and Plan / ED Course  I have reviewed the triage vital signs and the nursing notes.  Pertinent labs & imaging results that were available during my care of the patient were reviewed by me and considered in my medical decision making (see chart for details).        MDM  Screen complete  Patient is presenting for evaluation of reported episode of nausea.  This has improved prior to arrival.  Patient is also complaining of edema to both lower extremities.  This appears to be chronic in nature.  Screening labs did reveal mildly decreased magnesium.  This is also a chronic condition.  Patient's magnesium was repleted.  Following his ED evaluation and treatment he does feel improved.  Importance of close follow-up is stressed.  Strict return precautions given and understood.  Final Clinical Impressions(s) / ED Diagnoses   Final diagnoses:  Nausea  Hypomagnesemia    ED Discharge Orders         Ordered    ondansetron (ZOFRAN) 4 MG tablet  Every 6 hours     03/19/18 2142           Valarie Merino, MD 03/19/18 2206

## 2018-03-24 ENCOUNTER — Other Ambulatory Visit: Payer: Self-pay | Admitting: *Deleted

## 2018-03-24 NOTE — Patient Outreach (Addendum)
[place] [facility]) Care Management  03/24/2018  Calvin Mccormick 05/11/1945 [id number]   Telephone screening call  Referral date : 3/16 Referral source: [facility] Um Referral reason: Patient help with magnesium oxide.  Insurance: Advertising account planner and KPN reviewed : PMHX includes but not limited to hypomagnesemia, multiple myeloma ,dry skin dermatitis, CKD,   Successful outreach call to patient, explained reason for the call.  Social  Patient discussed that he lives at home with his wife. Patient reports that his wife is able to assist with personal care, is he needs it . Patient state that he doesn't drive but his wife does. Patient discussed having food sources in the home.  Patient able reports that he has a walker for use at home and an elevated commode sit.   Conditions  Patient discussed recent visit to ED for nausea, lower leg swelling, soreness . Patient discussed having some lower legs swelling often but not the soreness . Patient denies nausea reports tolerating diet well and has only taken prescription for nausea once. Patient discussed due to soreness in the legs he is using his walker. He report no always having to use his walker just now due to soreness in his legs.  Patient discussed being treated monthly for multiple myeloma with  pills that he takes on a monthly plan   Medications  Patient reports taking medications as prescribed, and no cost concern other than Magnesium costing $17 per month. Discussed importance of taking medication as prescribed, he discussed that he presently has about 7 day supply at home. Discussed support of family being able to assist with cost . Discussed possibility of magnesium being in the OTC catalog , patient reports that he has a catalog at home. Discussed assisting him with calling and verify if magnesium OTC is part of his plan , he states that he does not feel like it at this time.   Placed call to verify [facility] to verify  if patient has an prescription on file, representative state that it is an over the counter medication and his insurance plan does not cover.   Discussed with Lenis Dickinson, [facility] pharmacist, she suggested Good Rx coupon discount as patient has a prescription at [facility] may provide cost saving. She reports Magnesium is not listed on [employer] over the counter list .   Advanced Directive Patient does not have an advanced directive in place, began to explain and states he does not want additional information .  Appointment  Patient discussed that he had an appointment with Dr.Bland on today and did not go because he did not feel like it reinforced with patient importance of following up with provider   Discussed [facility] care management program , patient declines needs other than concern regarding cost of magnesium .   Plan  Will plan return call in the next 4 business days to complete call and discuss good rx coupon for some saving , as insurance does not cover OTC and magnesium is not listed on OTC benefit of [place] . Will send successful outreach letter   Joylene Draft, RN, [place] Management Coordinator  [phone]- Mobile [phone]- [place]

## 2018-03-26 ENCOUNTER — Other Ambulatory Visit: Payer: Self-pay | Admitting: *Deleted

## 2018-03-26 NOTE — Patient Outreach (Addendum)
[place] [facility]) Care Management  03/26/2018  Calvin Mccormick [birth year] [id number]   Telephone follow up screening call   Referral date : 3/16 Referral source: [facility] Referral reason: Patient help with magnesium oxide.  Insurance: Advertising account planner and KPN reviewed : PMHX includes but not limited to hypomagnesemia, multiple myeloma ,dry skin dermatitis, CKD,     Unsuccessful follow up call to complete screening call, patient not available per person answering phone.   Plan  Will plan return call in the next 4 business days    Joylene Draft, RN, [place] Management Coordinator  [phone]- Mobile [phone]- [place]

## 2018-03-28 ENCOUNTER — Other Ambulatory Visit: Payer: Self-pay | Admitting: Hematology and Oncology

## 2018-03-31 ENCOUNTER — Other Ambulatory Visit: Payer: Self-pay

## 2018-03-31 ENCOUNTER — Other Ambulatory Visit: Payer: Self-pay | Admitting: *Deleted

## 2018-03-31 NOTE — Patient Outreach (Signed)
[place] [facility]) Care Management  03/31/2018  MAINOR HELLMANN 1945-05-17 [id number]     Follow up Telephone outreach call to patient.   Telephone screening call  Referral date : 3/16 Referral source: [facility] Um Referral reason: Patient help with magnesium oxide.  Insurance: Advertising account planner and KPN reviewed : PMHX includes but not limited to hypomagnesemia, multiple myeloma ,dry skin dermatitis, CKD,   Successful follow up call to patient to complete screening call.  Discussed patient concern regarding purchasing magnesium oxide patient discussed that he was able to purchase and is taking as prescribed. Discuss option of  coupon such as Good Rx, since medication is OTC for some discount, not interested,   patient states it  is working out that he was able to get medication .  Patient discussed that he is moving around better at home, some decrease in swelling and soreness in legs. Encouraged post ED visit with Dr. Criss Rosales rescheduling visit.  Has family at home , food sources in place and family available able to assist as needed. Patient denies any new concerns at this time.   Plan Will close case : assessed no further intervention needed.  Encouraged to notify MD of any new concerns related to leg swelling, pain or increased weakness.   Joylene Draft, RN, [place] Management Coordinator  [phone]- Mobile [phone]- Toll Free Main Office

## 2018-04-06 ENCOUNTER — Ambulatory Visit
Admission: RE | Admit: 2018-04-06 | Discharge: 2018-04-06 | Disposition: A | Discharge status: Home / Self Care | Payer: Medicare Other | Source: Ambulatory Visit | Attending: Family Medicine | Admitting: Family Medicine

## 2018-04-06 ENCOUNTER — Other Ambulatory Visit: Payer: Self-pay

## 2018-04-06 ENCOUNTER — Other Ambulatory Visit: Payer: Self-pay | Admitting: Family Medicine

## 2018-04-06 DIAGNOSIS — Z7189 Other specified counseling: Secondary | ICD-10-CM | POA: Diagnosis not present

## 2018-04-06 DIAGNOSIS — M545 Low back pain, unspecified: Secondary | ICD-10-CM

## 2018-04-06 DIAGNOSIS — K591 Functional diarrhea: Secondary | ICD-10-CM | POA: Diagnosis not present

## 2018-04-06 DIAGNOSIS — C9002 Multiple myeloma in relapse: Secondary | ICD-10-CM | POA: Diagnosis not present

## 2018-04-06 DIAGNOSIS — M4140 Neuromuscular scoliosis, site unspecified: Secondary | ICD-10-CM | POA: Diagnosis not present

## 2018-04-06 DIAGNOSIS — M79605 Pain in left leg: Secondary | ICD-10-CM | POA: Diagnosis not present

## 2018-04-09 ENCOUNTER — Other Ambulatory Visit: Payer: Self-pay

## 2018-04-09 DIAGNOSIS — M4140 Neuromuscular scoliosis, site unspecified: Secondary | ICD-10-CM | POA: Diagnosis not present

## 2018-04-09 DIAGNOSIS — C9002 Multiple myeloma in relapse: Secondary | ICD-10-CM

## 2018-04-09 MED ORDER — POMALIDOMIDE 3 MG PO CAPS: 3.0000 mg | ORAL_CAPSULE | Freq: Every day | ORAL | 11 refills | Status: DC

## 2018-04-13 ENCOUNTER — Inpatient Hospital Stay: Payer: Medicare Other

## 2018-04-13 ENCOUNTER — Other Ambulatory Visit: Payer: Self-pay

## 2018-04-13 ENCOUNTER — Inpatient Hospital Stay: Payer: Medicare Other | Attending: Hematology and Oncology

## 2018-04-13 ENCOUNTER — Encounter: Payer: Self-pay | Admitting: Hematology and Oncology

## 2018-04-13 ENCOUNTER — Inpatient Hospital Stay (HOSPITAL_BASED_OUTPATIENT_CLINIC_OR_DEPARTMENT_OTHER): Payer: Medicare Other | Admitting: Hematology and Oncology

## 2018-04-13 VITALS — BP 137/59 | HR 59 | Temp 99.1°F | Resp 18

## 2018-04-13 DIAGNOSIS — Z7982 Long term (current) use of aspirin: Secondary | ICD-10-CM

## 2018-04-13 DIAGNOSIS — Z9221 Personal history of antineoplastic chemotherapy: Secondary | ICD-10-CM

## 2018-04-13 DIAGNOSIS — D631 Anemia in chronic kidney disease: Secondary | ICD-10-CM | POA: Insufficient documentation

## 2018-04-13 DIAGNOSIS — Z79899 Other long term (current) drug therapy: Secondary | ICD-10-CM

## 2018-04-13 DIAGNOSIS — C9002 Multiple myeloma in relapse: Secondary | ICD-10-CM | POA: Diagnosis not present

## 2018-04-13 DIAGNOSIS — I129 Hypertensive chronic kidney disease with stage 1 through stage 4 chronic kidney disease, or unspecified chronic kidney disease: Secondary | ICD-10-CM

## 2018-04-13 DIAGNOSIS — K047 Periapical abscess without sinus: Secondary | ICD-10-CM

## 2018-04-13 DIAGNOSIS — N183 Chronic kidney disease, stage 3 unspecified: Secondary | ICD-10-CM

## 2018-04-13 DIAGNOSIS — Z5112 Encounter for antineoplastic immunotherapy: Secondary | ICD-10-CM | POA: Insufficient documentation

## 2018-04-13 DIAGNOSIS — Z95828 Presence of other vascular implants and grafts: Secondary | ICD-10-CM

## 2018-04-13 LAB — CBC WITH DIFFERENTIAL/PLATELET
Abs Immature Granulocytes: 0 10*3/uL (ref 0.00–0.07)
Band Neutrophils: 3 %
Basophils Absolute: 0.1 10*3/uL (ref 0.0–0.1)
Basophils Relative: 1 %
Eosinophils Absolute: 0 10*3/uL (ref 0.0–0.5)
Eosinophils Relative: 0 %
HCT: 26 % — ABNORMAL LOW (ref 39.0–52.0)
Hemoglobin: 8.7 g/dL — ABNORMAL LOW (ref 13.0–17.0)
Lymphocytes Relative: 26 %
Lymphs Abs: 2.4 10*3/uL (ref 0.7–4.0)
MCH: 33.2 pg (ref 26.0–34.0)
MCHC: 33.5 g/dL (ref 30.0–36.0)
MCV: 99.2 fL (ref 80.0–100.0)
Monocytes Absolute: 1 10*3/uL (ref 0.1–1.0)
Monocytes Relative: 11 %
Neutro Abs: 5.8 10*3/uL (ref 1.7–17.7)
Neutrophils Relative %: 59 %
Platelets: 257 10*3/uL (ref 150–400)
RBC: 2.62 MIL/uL — ABNORMAL LOW (ref 4.22–5.81)
RDW: 15.5 % (ref 11.5–15.5)
WBC: 9.3 10*3/uL (ref 4.0–10.5)
nRBC: 0 % (ref 0.0–0.2)

## 2018-04-13 LAB — COMPREHENSIVE METABOLIC PANEL
ALT: 12 U/L (ref 0–44)
AST: 12 U/L — ABNORMAL LOW (ref 15–41)
Albumin: 2.9 g/dL — ABNORMAL LOW (ref 3.5–5.0)
Alkaline Phosphatase: 53 U/L (ref 38–126)
Anion gap: 8 (ref 5–15)
BUN: 15 mg/dL (ref 8–23)
CO2: 22 mmol/L (ref 22–32)
Calcium: 8.8 mg/dL — ABNORMAL LOW (ref 8.9–10.3)
Chloride: 104 mmol/L (ref 98–111)
Creatinine, Ser: 1.69 mg/dL — ABNORMAL HIGH (ref 0.61–1.24)
GFR calc Af Amer: 45 mL/min — ABNORMAL LOW (ref >60)
GFR calc non Af Amer: 39 mL/min — ABNORMAL LOW (ref >60)
Glucose, Bld: 92 mg/dL (ref 70–99)
Potassium: 3.9 mmol/L (ref 3.5–5.1)
Sodium: 134 mmol/L — ABNORMAL LOW (ref 135–145)
Total Bilirubin: 1.2 mg/dL (ref 0.3–1.2)
Total Protein: 6.7 g/dL (ref 6.5–8.1)

## 2018-04-13 MED ORDER — ACETAMINOPHEN 325 MG PO TABS
650.0000 mg | ORAL_TABLET | Freq: Once | ORAL | Status: AC
  Administered 2018-04-13: 650 mg via ORAL

## 2018-04-13 MED ORDER — DIPHENHYDRAMINE HCL 25 MG PO CAPS
ORAL_CAPSULE | ORAL | Status: AC
  Filled 2018-04-13: qty 2

## 2018-04-13 MED ORDER — PROCHLORPERAZINE MALEATE 10 MG PO TABS
ORAL_TABLET | ORAL | Status: AC
  Filled 2018-04-13: qty 1

## 2018-04-13 MED ORDER — SODIUM CHLORIDE 0.9% FLUSH
10.0000 mL | INTRAVENOUS | Status: DC | PRN
  Administered 2018-04-13: 10 mL
  Filled 2018-04-13: qty 10

## 2018-04-13 MED ORDER — PROCHLORPERAZINE MALEATE 10 MG PO TABS
10.0000 mg | ORAL_TABLET | Freq: Once | ORAL | Status: AC
  Administered 2018-04-13: 10 mg via ORAL

## 2018-04-13 MED ORDER — HEPARIN SOD (PORK) LOCK FLUSH 100 UNIT/ML IV SOLN
500.0000 [IU] | Freq: Once | INTRAVENOUS | Status: AC | PRN
  Administered 2018-04-13: 15:00:00 500 [IU]
  Filled 2018-04-13: qty 5

## 2018-04-13 MED ORDER — ACETAMINOPHEN 325 MG PO TABS
ORAL_TABLET | ORAL | Status: AC
  Filled 2018-04-13: qty 2

## 2018-04-13 MED ORDER — METHYLPREDNISOLONE SODIUM SUCC 40 MG IJ SOLR
40.0000 mg | Freq: Once | INTRAMUSCULAR | Status: AC
  Administered 2018-04-13: 12:00:00 40 mg via INTRAVENOUS

## 2018-04-13 MED ORDER — SODIUM CHLORIDE 0.9 % IV SOLN
Freq: Once | INTRAVENOUS | Status: AC
  Administered 2018-04-13: 12:00:00 via INTRAVENOUS
  Filled 2018-04-13: qty 250

## 2018-04-13 MED ORDER — SODIUM CHLORIDE 0.9 % IV SOLN
16.8000 mg/kg | Freq: Once | INTRAVENOUS | Status: AC
  Administered 2018-04-13: 13:00:00 1100 mg via INTRAVENOUS
  Filled 2018-04-13: qty 40

## 2018-04-13 MED ORDER — SODIUM CHLORIDE 0.9% FLUSH
10.0000 mL | Freq: Once | INTRAVENOUS | Status: AC
  Administered 2018-04-13: 11:00:00 10 mL
  Filled 2018-04-13: qty 10

## 2018-04-13 MED ORDER — METHYLPREDNISOLONE SODIUM SUCC 40 MG IJ SOLR
INTRAMUSCULAR | Status: AC
  Filled 2018-04-13: qty 1

## 2018-04-13 MED ORDER — AMOXICILLIN 500 MG PO TABS: 500.0000 mg | ORAL_TABLET | Freq: Two times a day (BID) | ORAL | 0 refills | Status: DC

## 2018-04-13 MED ORDER — DIPHENHYDRAMINE HCL 25 MG PO CAPS
50.0000 mg | ORAL_CAPSULE | Freq: Once | ORAL | Status: AC
  Administered 2018-04-13: 12:00:00 50 mg via ORAL

## 2018-04-13 NOTE — Patient Instructions (Signed)
[facility] Discharge Instructions for Patients Receiving Chemotherapy  Today you received the following chemotherapy agents: Darzalex  To help prevent nausea and vomiting after your treatment, we encourage you to take your nausea medication as directed.    If you develop nausea and vomiting that is not controlled by your nausea medication, call the clinic.   BELOW ARE SYMPTOMS THAT SHOULD BE REPORTED IMMEDIATELY:  *FEVER GREATER THAN 100.5 F  *CHILLS WITH OR WITHOUT FEVER  NAUSEA AND VOMITING THAT IS NOT CONTROLLED WITH YOUR NAUSEA MEDICATION  *UNUSUAL SHORTNESS OF BREATH  *UNUSUAL BRUISING OR BLEEDING  TENDERNESS IN MOUTH AND THROAT WITH OR WITHOUT PRESENCE OF ULCERS  *URINARY PROBLEMS  *BOWEL PROBLEMS  UNUSUAL RASH Items with * indicate a potential emergency and should be followed up as soon as possible.  Feel free to call the clinic should you have any questions or concerns. The clinic phone number is [phone].  Please show the CHEMO ALERT CARD at check-in to the Emergency Department and triage nurse.   

## 2018-04-13 NOTE — Progress Notes (Signed)
Per Dr. Alvy Bimler no Zometa today due to tooth infection and does not want to re-check magnesium at this time.   Jalene Mullet, PharmD PGY2 Hematology/ Oncology Pharmacy Resident 04/13/2018 12:09 PM

## 2018-04-13 NOTE — Assessment & Plan Note (Addendum)
He has mild facial swelling likely due to tooth infection I recommend a week course of amoxicillin I will call him in 3 days for assessment If he does not improve, I will have to order CT imaging to exclude abscess. I will hold Zometa today

## 2018-04-13 NOTE — Progress Notes (Signed)
[facility] OFFICE PROGRESS NOTE  Patient Care Team: Bland, Veita, MD as PCP - General (Family Medicine) Gorsuch, Ni, MD as Consulting Physician (Hematology and Oncology) Cunningham, Anne C, LCSW as Social Worker (General Practice)  ASSESSMENT & PLAN:  Multiple myeloma in relapse (HCC) His recent myeloma panel was stable.  Recently, I have increased the dose of pomalidomide Repeat myeloma panel from today is pending We will continue pomalidomide along with monthly daratumumab He will continue acyclovir for antimicrobial prophylaxis He will continue calcium with vitamin D along with Zometa  Chronic renal insufficiency, stage III (moderate) I recommend increase oral fluid intake as tolerated We will monitor his kidney function carefully. We will proceed regardless of creatinine function with daratumumab  Anemia in chronic renal disease He has multifactorial anemia, combination of anemia chronic kidney disease and recent blood loss/iron deficiency anemia His hemoglobin and iron studies are stable.  Continue close observation   Tooth infection He has mild facial swelling likely due to tooth infection I recommend a week course of amoxicillin I will call him in 3 days for assessment If he does not improve, I will have to order CT imaging to exclude abscess. I will hold Zometa today   No orders of the defined types were placed in this encounter.   INTERVAL HISTORY: Please see below for problem oriented charting. He returns for further follow-up He complains of mild facial swelling 5 days ago Denies fever or chills No recent diarrhea, changes in bowel habits or nausea Appetite is stable Denies recent cough, chest pain or shortness of breath  SUMMARY OF ONCOLOGIC HISTORY:   Multiple myeloma in relapse (HCC)   12/12/2010 Initial Diagnosis    Multiple myeloma    02/16/2014 Imaging    Skeletal survey showed diffuse osteopenia    03/02/2014 Bone Marrow Biopsy     Accession: [id number] BM biopsy showed only 5 % plasma cells. However, the biopsy was difficult and the bone was fragmented during the procedure. Cytogenetics and FISH study is normal    03/03/2014 Procedure    he has port placement    03/10/2014 - 05/19/2014 Chemotherapy    he received elotuzumab and revlimid    05/20/2014 - 02/25/2016 Chemotherapy    Treatment is switched to maintenance Revlimid only. Treatment is stopped due to progressive disease    11/15/2014 Adverse Reaction    He has mild worsening anemia. Does of Revlimid reduced to 5 mg 21 days on, 7 days off    03/06/2016 -  Chemotherapy    He received Daratumumab and Velcade. Velcade is stopped on 07/24/16    08/14/2016 Miscellaneous    Pomalyst is added along with Daratumumab    10/17/2016 Surgery    EGD was performed for coffee-ground emesis. Multiple linear superficial ulcers were noted in the esophagus from 25-35 cm from insertion. Severe esophagitis was noted from 30-35 cm from insertion. Multiple biopsies were obtained. A small hiatal hernia was noted. The gastric cavity contained coffee-ground fluid, after suctioning and lavage, no obvious gastric lesions/erosion/ulceration/erythema was noted. Biopsies were taken from the antrum to rule out H. Pylori. Duodenum bulb appeared unremarkable. A small diverticulum was noted in second portion of the duodenum.      12/06/2016 Imaging    Ct scan abdomen No acute findings.  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  Stable small hiatal hernia.  Colonic diverticulosis, without radiographic evidence of diverticulitis.     REVIEW OF SYSTEMS:   Constitutional: Denies fevers, chills   or abnormal weight loss Eyes: Denies blurriness of vision Ears, nose, mouth, throat, and face: Denies mucositis or sore throat Respiratory: Denies cough, dyspnea or wheezes Cardiovascular: Denies palpitation, chest discomfort or lower extremity  swelling Gastrointestinal:  Denies nausea, heartburn or change in bowel habits Skin: Denies abnormal skin rashes Lymphatics: Denies new lymphadenopathy or easy bruising Neurological:Denies numbness, tingling or new weaknesses Behavioral/Psych: Mood is stable, no new changes  All other systems were reviewed with the patient and are negative.  I have reviewed the past medical history, past surgical history, social history and family history with the patient and they are unchanged from previous note.  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  MEDICATIONS:  Current Outpatient Medications  Medication Sig Dispense Refill  . acyclovir (ZOVIRAX) 400 MG tablet Take 1 tablet (400 mg total) by mouth daily. 60 tablet 11  . allopurinol (ZYLOPRIM) 100 MG tablet Take 100 mg by mouth 2 (two) times daily.     Marland Kitchen amLODipine (NORVASC) 10 MG tablet Take 10 mg by mouth daily.  1  . amoxicillin (AMOXIL) 500 MG tablet Take 1 tablet (500 mg total) by mouth 2 (two) times daily. 14 tablet 0  . aspirin EC 81 MG tablet Take 81 mg by mouth daily.    Marland Kitchen atorvastatin (LIPITOR) 20 MG tablet Take 20 mg by mouth at bedtime.     . brimonidine (ALPHAGAN) 0.2 % ophthalmic solution Place 1 drop into the right eye 3 (three) times daily.     . calcium carbonate (TUMS - DOSED IN MG ELEMENTAL CALCIUM) 500 MG chewable tablet Chew 1 tablet by mouth daily as needed for indigestion or heartburn.    . cholecalciferol (VITAMIN D) 1000 UNITS tablet Take 1,000 Units by mouth daily.    . dorzolamide-timolol (COSOPT) 22.3-6.8 MG/ML ophthalmic solution Place 1 drop into the right eye 2 (two) times daily.     . fluorometholone (FML) 0.1 % ophthalmic suspension Place 1 drop into the right eye daily.     Marland Kitchen latanoprost (XALATAN) 0.005 % ophthalmic solution Place 1 drop into the right eye at bedtime.    . lidocaine-prilocaine (EMLA) cream Apply to affected area once 30 g 3  . loperamide (IMODIUM A-D) 2 MG tablet 1 po after each watery  BM.  Maximum 6 per 24hrs. (Patient not taking: Reported on 03/19/2018) 30 tablet PRN  . magnesium oxide (MAG-OX) 400 (241.3 Mg) MG tablet Take 1 tablet (400 mg total) by mouth 2 (two) times daily. 60 tablet 9  . magnesium oxide (MAG-OX) 400 MG tablet TAKE 1 TABLET BY MOUTH TWICE A DAY 180 tablet 3  . ondansetron (ZOFRAN) 4 MG tablet Take 1 tablet (4 mg total) by mouth every 6 (six) hours. 12 tablet 0  . ondansetron (ZOFRAN) 8 MG tablet Take 1 tablet (8 mg total) by mouth 2 (two) times daily as needed (Nausea or vomiting). 30 tablet 1  . pantoprazole (PROTONIX) 40 MG tablet Take 1 tablet (40 mg total) by mouth daily. (Patient not taking: Reported on 03/19/2018) 180 tablet 3  . pomalidomide (POMALYST) 3 MG capsule Take 1 capsule (3 mg total) by mouth daily. Take with water on days 1-21. Repeat every 28 days. 21 capsule 11   No current facility-administered medications for this visit.    Facility-Administered Medications Ordered in Other Visits  Medication Dose Route Frequency Provider Last Rate Last Dose  . daratumumab (DARZALEX) 1,100 mg in sodium chloride 0.9 % 445 mL chemo infusion  16.8 mg/kg (Order-Specific) Intravenous  Once Heath Lark, MD      . heparin lock flush 100 unit/mL  500 Units Intracatheter Once PRN Alvy Bimler, Reshanda Lewey, MD      . sodium chloride flush (NS) 0.9 % injection 10 mL  10 mL Intracatheter PRN Alvy Bimler, Ramona Slinger, MD        PHYSICAL EXAMINATION: ECOG PERFORMANCE STATUS: 2 - Symptomatic, <50% confined to bed  Vitals:   04/13/18 1142  BP: 120/88  Pulse: 82  Resp: 17  Temp: 99.7 F (37.6 C)  SpO2: 92%   Filed Weights   04/13/18 1142  Weight: 144 lb 12.8 oz (65.7 kg)    GENERAL:alert, no distress and comfortable SKIN: skin color, texture, turgor are normal, no rashes or significant lesions EYES: normal, Conjunctiva are pink and non-injected, sclera clear OROPHARYNX: Noted left facial swelling, mild tenderness on palpation near the left maxillary region NECK: supple, thyroid  normal size, non-tender, without nodularity LYMPH:  no palpable lymphadenopathy in the cervical, axillary or inguinal LUNGS: clear to auscultation and percussion with normal breathing effort HEART: regular rate & rhythm and no murmurs and no lower extremity edema ABDOMEN:abdomen soft, non-tender and normal bowel sounds Musculoskeletal:no cyanosis of digits and no clubbing  NEURO: alert & oriented x 3 with fluent speech, no focal motor/sensory deficits  LABORATORY DATA:  I have reviewed the data as listed    Component Value Date/Time   NA 134 (L) 04/13/2018 1116   NA 140 01/09/2017 0913   K 3.9 04/13/2018 1116   K 3.7 01/09/2017 0913   CL 104 04/13/2018 1116   CL 107 06/29/2012 1131   CO2 22 04/13/2018 1116   CO2 21 (L) 01/09/2017 0913   GLUCOSE 92 04/13/2018 1116   GLUCOSE 80 01/09/2017 0913   GLUCOSE 143 (H) 06/29/2012 1131   BUN 15 04/13/2018 1116   BUN 9.8 01/09/2017 0913   CREATININE 1.69 (H) 04/13/2018 1116   CREATININE 1.5 (H) 01/09/2017 0913   CALCIUM 8.8 (L) 04/13/2018 1116   CALCIUM 7.5 (L) 01/09/2017 0913   PROT 6.7 04/13/2018 1116   PROT 5.7 (L) 01/09/2017 0913   ALBUMIN 2.9 (L) 04/13/2018 1116   ALBUMIN 2.9 (L) 01/09/2017 0913   AST 12 (L) 04/13/2018 1116   AST 12 01/09/2017 0913   ALT 12 04/13/2018 1116   ALT 10 01/09/2017 0913   ALKPHOS 53 04/13/2018 1116   ALKPHOS 55 01/09/2017 0913   BILITOT 1.2 04/13/2018 1116   BILITOT 0.44 01/09/2017 0913   GFRNONAA 39 (L) 04/13/2018 1116   GFRAA 45 (L) 04/13/2018 1116    No results found for: SPEP, UPEP  Lab Results  Component Value Date   WBC 9.3 04/13/2018   NEUTROABS 5.8 04/13/2018   HGB 8.7 (L) 04/13/2018   HCT 26.0 (L) 04/13/2018   MCV 99.2 04/13/2018   PLT 257 04/13/2018      Chemistry      Component Value Date/Time   NA 134 (L) 04/13/2018 1116   NA 140 01/09/2017 0913   K 3.9 04/13/2018 1116   K 3.7 01/09/2017 0913   CL 104 04/13/2018 1116   CL 107 06/29/2012 1131   CO2 22 04/13/2018 1116    CO2 21 (L) 01/09/2017 0913   BUN 15 04/13/2018 1116   BUN 9.8 01/09/2017 0913   CREATININE 1.69 (H) 04/13/2018 1116   CREATININE 1.5 (H) 01/09/2017 0913      Component Value Date/Time   CALCIUM 8.8 (L) 04/13/2018 1116   CALCIUM 7.5 (L) 01/09/2017 0913   ALKPHOS  53 04/13/2018 1116   ALKPHOS 55 01/09/2017 0913   AST 12 (L) 04/13/2018 1116   AST 12 01/09/2017 0913   ALT 12 04/13/2018 1116   ALT 10 01/09/2017 0913   BILITOT 1.2 04/13/2018 1116   BILITOT 0.44 01/09/2017 0913       RADIOGRAPHIC STUDIES: I have personally reviewed the radiological images as listed and agreed with the findings in the report. Dg Lumbar Spine Complete  Result Date: 04/06/2018 CLINICAL DATA:  History of multiple myeloma. Severe low back and left leg pain. No known injury. EXAM: LUMBAR SPINE - COMPLETE 4+ VIEW COMPARISON:  Plain films lumbar spine 09/30/2017. CT abdomen and pelvis 12/06/2016. FINDINGS: Severe convex left scoliosis and advanced multilevel degenerative disease are stable in appearance. No fracture or focal lesion is identified. Paraspinous structures are unremarkable. IMPRESSION: No acute finding. No change in the appearance of severe convex left scoliosis and multilevel degenerative disease. Electronically Signed   By: Inge Rise M.D.   On: 04/06/2018 15:10   Dg Pelvis 1-2 Views  Result Date: 04/06/2018 CLINICAL DATA:  History of multiple myeloma. Severe low back and left leg pain. No known injury. EXAM: PELVIS - 1-2 VIEW COMPARISON:  CT abdomen and pelvis 12/06/2016. FINDINGS: No acute bony or joint abnormality is identified. No focal bony lesion is seen. Severe convex left lumbar scoliosis and multilevel degenerative disease noted. IMPRESSION: No acute finding.  No focal lesion is identified. Electronically Signed   By: Inge Rise M.D.   On: 04/06/2018 15:09   Dg Chest Port 1 View  Result Date: 03/19/2018 CLINICAL DATA:  Nausea and vomiting. EXAM: PORTABLE CHEST 1 VIEW COMPARISON:   09/30/2017 FINDINGS: 1840 hours. The cardiopericardial silhouette is within normal limits for size. The lungs are clear without focal pneumonia, edema, pneumothorax or pleural effusion. Right Port-A-Cath tip overlies distal SVC. The visualized bony structures of the thorax are intact. IMPRESSION: No acute findings. Electronically Signed   By: Misty Stanley M.D.   On: 03/19/2018 19:01   Dg Femur Min 2 Views Left  Result Date: 04/06/2018 CLINICAL DATA:  History of multiple myeloma. Severe low back and left leg pain. No known injury. EXAM: LEFT FEMUR 2 VIEWS COMPARISON:  None. FINDINGS: There is no evidence of fracture or other focal bone lesions. Soft tissues are unremarkable. IMPRESSION: Negative exam. Electronically Signed   By: Inge Rise M.D.   On: 04/06/2018 15:09    All questions were answered. The patient knows to call the clinic with any problems, questions or concerns. No barriers to learning was detected.  I spent 20 minutes counseling the patient face to face. The total time spent in the appointment was 30 minutes and more than 50% was on counseling and review of test results  Heath Lark, MD 04/13/2018 12:10 PM

## 2018-04-13 NOTE — Assessment & Plan Note (Signed)
He has multifactorial anemia, combination of anemia chronic kidney disease and recent blood loss/iron deficiency anemia His hemoglobin and iron studies are stable.  Continue close observation

## 2018-04-13 NOTE — Assessment & Plan Note (Signed)
His recent myeloma panel was stable.  Recently, I have increased the dose of pomalidomide Repeat myeloma panel from today is pending We will continue pomalidomide along with monthly daratumumab He will continue acyclovir for antimicrobial prophylaxis He will continue calcium with vitamin D along with Zometa

## 2018-04-13 NOTE — Assessment & Plan Note (Signed)
I recommend increase oral fluid intake as tolerated We will monitor his kidney function carefully. We will proceed regardless of creatinine function with daratumumab

## 2018-04-14 LAB — MULTIPLE MYELOMA PANEL, SERUM
Albumin SerPl Elph-Mcnc: 2.9 g/dL (ref 2.9–4.4)
Albumin/Glob SerPl: 1 (ref 0.7–1.7)
Alpha 1: 0.4 g/dL (ref 0.0–0.4)
Alpha2 Glob SerPl Elph-Mcnc: 0.8 g/dL (ref 0.4–1.0)
B-Globulin SerPl Elph-Mcnc: 0.8 g/dL (ref 0.7–1.3)
Gamma Glob SerPl Elph-Mcnc: 1.2 g/dL (ref 0.4–1.8)
Globulin, Total: 3.2 g/dL (ref 2.2–3.9)
IgA: 28 mg/dL — ABNORMAL LOW (ref 61–437)
IgG (Immunoglobin G), Serum: 1423 mg/dL (ref 603–1613)
IgM (Immunoglobulin M), Srm: 7 mg/dL — ABNORMAL LOW (ref 15–143)
M Protein SerPl Elph-Mcnc: 1.1 g/dL — ABNORMAL HIGH
Total Protein ELP: 6.1 g/dL (ref 6.0–8.5)

## 2018-04-14 LAB — KAPPA/LAMBDA LIGHT CHAINS
Kappa free light chain: 115.4 mg/L — ABNORMAL HIGH (ref 3.3–19.4)
Kappa, lambda light chain ratio: 11.43 — ABNORMAL HIGH (ref 0.26–1.65)
Lambda free light chains: 10.1 mg/L (ref 5.7–26.3)

## 2018-04-16 ENCOUNTER — Telehealth: Payer: Self-pay

## 2018-04-16 NOTE — Telephone Encounter (Signed)
3 calls made to contact pt to numbers provided.  Cell number msg says "call can not go through at this time".   Other number provided - someone picks up but a lot of static and background noise - can heare people talking but cannot hear anyone talking to me.   Will continue to get in contact with pt.

## 2018-04-16 NOTE — Telephone Encounter (Signed)
Called again x 2 - busy signal both times.

## 2018-04-16 NOTE — Telephone Encounter (Signed)
Attempted again to contact pt. Called [phone].  Someone does pick up the phone but they do not speak.  I can hear someone talking in the background but no one answers me when I talk. Called spouses cell phone and obtained vm. Lvm asking to please have pt call this office ASAP.

## 2018-04-30 DIAGNOSIS — M545 Low back pain: Secondary | ICD-10-CM | POA: Diagnosis not present

## 2018-04-30 DIAGNOSIS — M791 Myalgia, unspecified site: Secondary | ICD-10-CM | POA: Diagnosis not present

## 2018-04-30 DIAGNOSIS — I10 Essential (primary) hypertension: Secondary | ICD-10-CM | POA: Diagnosis not present

## 2018-05-04 ENCOUNTER — Other Ambulatory Visit: Payer: Self-pay

## 2018-05-04 DIAGNOSIS — C9002 Multiple myeloma in relapse: Secondary | ICD-10-CM

## 2018-05-04 MED ORDER — POMALIDOMIDE 3 MG PO CAPS: 3.0000 mg | ORAL_CAPSULE | Freq: Every day | ORAL | 11 refills | Status: DC

## 2018-05-06 ENCOUNTER — Encounter: Payer: Self-pay | Admitting: Pharmacist

## 2018-05-11 ENCOUNTER — Encounter: Payer: Self-pay | Admitting: Hematology and Oncology

## 2018-05-11 ENCOUNTER — Inpatient Hospital Stay: Payer: Medicare Other

## 2018-05-11 ENCOUNTER — Inpatient Hospital Stay (HOSPITAL_BASED_OUTPATIENT_CLINIC_OR_DEPARTMENT_OTHER): Payer: Medicare Other | Admitting: Hematology and Oncology

## 2018-05-11 ENCOUNTER — Other Ambulatory Visit: Payer: Self-pay

## 2018-05-11 ENCOUNTER — Telehealth: Payer: Self-pay | Admitting: *Deleted

## 2018-05-11 ENCOUNTER — Inpatient Hospital Stay: Payer: Medicare Other | Attending: Hematology and Oncology

## 2018-05-11 VITALS — BP 143/66 | HR 53 | Temp 97.7°F | Resp 20

## 2018-05-11 DIAGNOSIS — Z7982 Long term (current) use of aspirin: Secondary | ICD-10-CM | POA: Insufficient documentation

## 2018-05-11 DIAGNOSIS — D631 Anemia in chronic kidney disease: Secondary | ICD-10-CM | POA: Diagnosis not present

## 2018-05-11 DIAGNOSIS — N183 Chronic kidney disease, stage 3 unspecified: Secondary | ICD-10-CM

## 2018-05-11 DIAGNOSIS — Z79899 Other long term (current) drug therapy: Secondary | ICD-10-CM

## 2018-05-11 DIAGNOSIS — Z5112 Encounter for antineoplastic immunotherapy: Secondary | ICD-10-CM | POA: Insufficient documentation

## 2018-05-11 DIAGNOSIS — Z95828 Presence of other vascular implants and grafts: Secondary | ICD-10-CM

## 2018-05-11 DIAGNOSIS — C9002 Multiple myeloma in relapse: Secondary | ICD-10-CM

## 2018-05-11 LAB — COMPREHENSIVE METABOLIC PANEL
ALT: 8 U/L (ref 0–44)
AST: 12 U/L — ABNORMAL LOW (ref 15–41)
Albumin: 3 g/dL — ABNORMAL LOW (ref 3.5–5.0)
Alkaline Phosphatase: 63 U/L (ref 38–126)
Anion gap: 6 (ref 5–15)
BUN: 14 mg/dL (ref 8–23)
CO2: 24 mmol/L (ref 22–32)
Calcium: 8.1 mg/dL — ABNORMAL LOW (ref 8.9–10.3)
Chloride: 109 mmol/L (ref 98–111)
Creatinine, Ser: 1.54 mg/dL — ABNORMAL HIGH (ref 0.61–1.24)
GFR calc Af Amer: 50 mL/min — ABNORMAL LOW (ref >60)
GFR calc non Af Amer: 43 mL/min — ABNORMAL LOW (ref >60)
Glucose, Bld: 74 mg/dL (ref 70–99)
Potassium: 4.1 mmol/L (ref 3.5–5.1)
Sodium: 139 mmol/L (ref 135–145)
Total Bilirubin: 0.5 mg/dL (ref 0.3–1.2)
Total Protein: 6.5 g/dL (ref 6.5–8.1)

## 2018-05-11 LAB — CBC WITH DIFFERENTIAL/PLATELET
Abs Immature Granulocytes: 0.06 10*3/uL (ref 0.00–0.07)
Basophils Absolute: 0.1 10*3/uL (ref 0.0–0.1)
Basophils Relative: 2 %
Eosinophils Absolute: 0.3 10*3/uL (ref 0.0–0.5)
Eosinophils Relative: 5 %
HCT: 26.5 % — ABNORMAL LOW (ref 39.0–52.0)
Hemoglobin: 8.4 g/dL — ABNORMAL LOW (ref 13.0–17.0)
Immature Granulocytes: 1 %
Lymphocytes Relative: 55 %
Lymphs Abs: 3.3 10*3/uL (ref 0.7–4.0)
MCH: 31.8 pg (ref 26.0–34.0)
MCHC: 31.7 g/dL (ref 30.0–36.0)
MCV: 100.4 fL — ABNORMAL HIGH (ref 80.0–100.0)
Monocytes Absolute: 0.6 10*3/uL (ref 0.1–1.0)
Monocytes Relative: 9 %
Neutro Abs: 1.7 10*3/uL (ref 1.7–7.7)
Neutrophils Relative %: 28 %
Platelets: 277 10*3/uL (ref 150–400)
RBC: 2.64 MIL/uL — ABNORMAL LOW (ref 4.22–5.81)
RDW: 16.4 % — ABNORMAL HIGH (ref 11.5–15.5)
WBC: 6.1 10*3/uL (ref 4.0–10.5)
nRBC: 0 % (ref 0.0–0.2)

## 2018-05-11 MED ORDER — HEPARIN SOD (PORK) LOCK FLUSH 100 UNIT/ML IV SOLN
500.0000 [IU] | Freq: Once | INTRAVENOUS | Status: AC | PRN
  Administered 2018-05-11: 500 [IU]
  Filled 2018-05-11: qty 5

## 2018-05-11 MED ORDER — PROCHLORPERAZINE MALEATE 10 MG PO TABS
ORAL_TABLET | ORAL | Status: AC
  Filled 2018-05-11: qty 1

## 2018-05-11 MED ORDER — ACETAMINOPHEN 325 MG PO TABS
650.0000 mg | ORAL_TABLET | Freq: Once | ORAL | Status: AC
  Administered 2018-05-11: 650 mg via ORAL

## 2018-05-11 MED ORDER — SODIUM CHLORIDE 0.9 % IV SOLN
Freq: Once | INTRAVENOUS | Status: AC
  Administered 2018-05-11: 12:00:00 via INTRAVENOUS
  Filled 2018-05-11: qty 250

## 2018-05-11 MED ORDER — METHYLPREDNISOLONE SODIUM SUCC 40 MG IJ SOLR
INTRAMUSCULAR | Status: AC
  Filled 2018-05-11: qty 1

## 2018-05-11 MED ORDER — DIPHENHYDRAMINE HCL 25 MG PO CAPS
50.0000 mg | ORAL_CAPSULE | Freq: Once | ORAL | Status: AC
  Administered 2018-05-11: 50 mg via ORAL

## 2018-05-11 MED ORDER — SODIUM CHLORIDE 0.9% FLUSH
10.0000 mL | INTRAVENOUS | Status: DC | PRN
  Administered 2018-05-11: 10 mL
  Filled 2018-05-11: qty 10

## 2018-05-11 MED ORDER — METHYLPREDNISOLONE SODIUM SUCC 40 MG IJ SOLR
40.0000 mg | Freq: Once | INTRAMUSCULAR | Status: AC
  Administered 2018-05-11: 40 mg via INTRAVENOUS

## 2018-05-11 MED ORDER — METHYLPREDNISOLONE SODIUM SUCC 125 MG IJ SOLR
INTRAMUSCULAR | Status: AC
  Filled 2018-05-11: qty 2

## 2018-05-11 MED ORDER — SODIUM CHLORIDE 0.9% FLUSH
10.0000 mL | Freq: Once | INTRAVENOUS | Status: AC
  Administered 2018-05-11: 10 mL
  Filled 2018-05-11: qty 10

## 2018-05-11 MED ORDER — DIPHENHYDRAMINE HCL 25 MG PO CAPS
ORAL_CAPSULE | ORAL | Status: AC
  Filled 2018-05-11: qty 2

## 2018-05-11 MED ORDER — ACETAMINOPHEN 325 MG PO TABS
ORAL_TABLET | ORAL | Status: AC
  Filled 2018-05-11: qty 2

## 2018-05-11 MED ORDER — PROCHLORPERAZINE MALEATE 10 MG PO TABS
10.0000 mg | ORAL_TABLET | Freq: Once | ORAL | Status: AC
  Administered 2018-05-11: 10 mg via ORAL

## 2018-05-11 MED ORDER — ZOLEDRONIC ACID 4 MG/5ML IV CONC
3.0000 mg | Freq: Once | INTRAVENOUS | Status: AC
  Administered 2018-05-11: 3 mg via INTRAVENOUS
  Filled 2018-05-11: qty 3.75

## 2018-05-11 MED ORDER — SODIUM CHLORIDE 0.9 % IV SOLN
16.8000 mg/kg | Freq: Once | INTRAVENOUS | Status: AC
  Administered 2018-05-11: 1100 mg via INTRAVENOUS
  Filled 2018-05-11: qty 15

## 2018-05-11 NOTE — Assessment & Plan Note (Signed)
His recent myeloma panel was stable.  Recently, I have increased the dose of pomalidomide Repeat myeloma panel from today is pending We will continue pomalidomide along with monthly daratumumab He will continue acyclovir for antimicrobial prophylaxis He will continue calcium with vitamin D along with Zometa

## 2018-05-11 NOTE — Telephone Encounter (Signed)
Patient to receive appts with his discharge summary after his treatment today

## 2018-05-11 NOTE — Assessment & Plan Note (Signed)
He has multifactorial anemia, combination of anemia chronic kidney disease and recent blood loss/iron deficiency anemia His hemoglobin and iron studies are stable.  Continue close observation

## 2018-05-11 NOTE — Assessment & Plan Note (Signed)
I recommend increase oral fluid intake as tolerated We will monitor his kidney function carefully. We will proceed regardless of creatinine function with daratumumab

## 2018-05-11 NOTE — Progress Notes (Signed)
[facility] OFFICE PROGRESS NOTE  Patient Care Team: Lucianne Lei, MD as PCP - General (Family Medicine) Heath Lark, MD as Consulting Physician (Hematology and Oncology) Beverely Pace, LCSW as Social Worker (General Practice)  ASSESSMENT & PLAN:  Multiple myeloma in relapse [facility]) His recent myeloma panel was stable.  Recently, I have increased the dose of pomalidomide Repeat myeloma panel from today is pending We will continue pomalidomide along with monthly daratumumab He will continue acyclovir for antimicrobial prophylaxis He will continue calcium with vitamin D along with Zometa  Anemia in chronic renal disease He has multifactorial anemia, combination of anemia chronic kidney disease and recent blood loss/iron deficiency anemia His hemoglobin and iron studies are stable.  Continue close observation   Chronic renal insufficiency, stage III (moderate) I recommend increase oral fluid intake as tolerated We will monitor his kidney function carefully. We will proceed regardless of creatinine function with daratumumab   No orders of the defined types were placed in this encounter.   INTERVAL HISTORY: Please see below for problem oriented charting. Since the last time I saw him, he is doing well The maxilla infection had resolved and he is eating well and gaining weight No new bone pain No recent infection, fever or chills No recent cough The patient denies any recent signs or symptoms of bleeding such as spontaneous epistaxis, hematuria or hematochezia.   SUMMARY OF ONCOLOGIC HISTORY:   Multiple myeloma in relapse ([place])   12/12/2010 Initial Diagnosis    Multiple myeloma    02/16/2014 Imaging    Skeletal survey showed diffuse osteopenia    03/02/2014 Bone Marrow Biopsy    Accession: [id number] BM biopsy showed only 5 % plasma cells. However, the biopsy was difficult and the bone was fragmented during the procedure. Cytogenetics and FISH study is  normal    03/03/2014 Procedure    he has port placement    03/10/2014 - 05/19/2014 Chemotherapy    he received elotuzumab and revlimid    05/20/2014 - 02/25/2016 Chemotherapy    Treatment is switched to maintenance Revlimid only. Treatment is stopped due to progressive disease    11/15/2014 Adverse Reaction    He has mild worsening anemia. Does of Revlimid reduced to 5 mg 21 days on, 7 days off    03/06/2016 -  Chemotherapy    He received Daratumumab and Velcade. Velcade is stopped on 07/24/16    08/14/2016 Miscellaneous    Pomalyst is added along with Daratumumab    10/17/2016 Surgery    EGD was performed for coffee-ground emesis. Multiple linear superficial ulcers were noted in the esophagus from 25-35 cm from insertion. Severe esophagitis was noted from 30-35 cm from insertion. Multiple biopsies were obtained. A small hiatal hernia was noted. The gastric cavity contained coffee-ground fluid, after suctioning and lavage, no obvious gastric lesions/erosion/ulceration/erythema was noted. Biopsies were taken from the antrum to rule out H. Pylori. Duodenum bulb appeared unremarkable. A small diverticulum was noted in second portion of the duodenum.      12/06/2016 Imaging    Ct scan abdomen No acute findings.  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  Stable small hiatal hernia.  Colonic diverticulosis, without radiographic evidence of diverticulitis.     REVIEW OF SYSTEMS:   Constitutional: Denies fevers, chills or abnormal weight loss Eyes: Denies blurriness of vision Ears, nose, mouth, throat, and face: Denies mucositis or sore throat Respiratory: Denies cough, dyspnea or wheezes Cardiovascular: Denies palpitation, chest discomfort or lower  extremity swelling Gastrointestinal:  Denies nausea, heartburn or change in bowel habits Skin: Denies abnormal skin rashes Lymphatics: Denies new lymphadenopathy or easy bruising Neurological:Denies  numbness, tingling or new weaknesses Behavioral/Psych: Mood is stable, no new changes  All other systems were reviewed with the patient and are negative.  I have reviewed the past medical history, past surgical history, social history and family history with the patient and they are unchanged from previous note.  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  MEDICATIONS:  Current Outpatient Medications  Medication Sig Dispense Refill  . acyclovir (ZOVIRAX) 400 MG tablet Take 1 tablet (400 mg total) by mouth daily. 60 tablet 11  . allopurinol (ZYLOPRIM) 100 MG tablet Take 100 mg by mouth 2 (two) times daily.     Marland Kitchen amLODipine (NORVASC) 10 MG tablet Take 10 mg by mouth daily.  1  . amoxicillin (AMOXIL) 500 MG tablet Take 1 tablet (500 mg total) by mouth 2 (two) times daily. 14 tablet 0  . aspirin EC 81 MG tablet Take 81 mg by mouth daily.    Marland Kitchen atorvastatin (LIPITOR) 20 MG tablet Take 20 mg by mouth at bedtime.     . brimonidine (ALPHAGAN) 0.2 % ophthalmic solution Place 1 drop into the right eye 3 (three) times daily.     . calcium carbonate (TUMS - DOSED IN MG ELEMENTAL CALCIUM) 500 MG chewable tablet Chew 1 tablet by mouth daily as needed for indigestion or heartburn.    . cholecalciferol (VITAMIN D) 1000 UNITS tablet Take 1,000 Units by mouth daily.    . dorzolamide-timolol (COSOPT) 22.3-6.8 MG/ML ophthalmic solution Place 1 drop into the right eye 2 (two) times daily.     . fluorometholone (FML) 0.1 % ophthalmic suspension Place 1 drop into the right eye daily.     Marland Kitchen latanoprost (XALATAN) 0.005 % ophthalmic solution Place 1 drop into the right eye at bedtime.    . lidocaine-prilocaine (EMLA) cream Apply to affected area once 30 g 3  . loperamide (IMODIUM A-D) 2 MG tablet 1 po after each watery BM.  Maximum 6 per 24hrs. (Patient not taking: Reported on 03/19/2018) 30 tablet PRN  . magnesium oxide (MAG-OX) 400 (241.3 Mg) MG tablet Take 1 tablet (400 mg total) by mouth 2 (two) times  daily. 60 tablet 9  . magnesium oxide (MAG-OX) 400 MG tablet TAKE 1 TABLET BY MOUTH TWICE A DAY 180 tablet 3  . ondansetron (ZOFRAN) 4 MG tablet Take 1 tablet (4 mg total) by mouth every 6 (six) hours. 12 tablet 0  . ondansetron (ZOFRAN) 8 MG tablet Take 1 tablet (8 mg total) by mouth 2 (two) times daily as needed (Nausea or vomiting). 30 tablet 1  . pantoprazole (PROTONIX) 40 MG tablet Take 1 tablet (40 mg total) by mouth daily. (Patient not taking: Reported on 03/19/2018) 180 tablet 3  . pomalidomide (POMALYST) 3 MG capsule Take 1 capsule (3 mg total) by mouth daily. Take with water on days 1-21. Repeat every 28 days. 21 capsule 11   No current facility-administered medications for this visit.     PHYSICAL EXAMINATION: ECOG PERFORMANCE STATUS: 2 - Symptomatic, <50% confined to bed   GENERAL:alert, no distress and comfortable SKIN: skin color, texture, turgor are normal, no rashes or significant lesions EYES: normal, Conjunctiva are pink and non-injected, sclera clear OROPHARYNX:no exudate, no erythema and lips, buccal mucosa, and tongue normal  NECK: supple, thyroid normal size, non-tender, without nodularity LYMPH:  no palpable lymphadenopathy in the cervical,  axillary or inguinal LUNGS: clear to auscultation and percussion with normal breathing effort HEART: regular rate & rhythm and no murmurs and no lower extremity edema ABDOMEN:abdomen soft, non-tender and normal bowel sounds Musculoskeletal:no cyanosis of digits and no clubbing  NEURO: alert & oriented x 3 with fluent speech, no focal motor/sensory deficits  LABORATORY DATA:  I have reviewed the data as listed    Component Value Date/Time   NA 134 (L) 04/13/2018 1116   NA 140 01/09/2017 0913   K 3.9 04/13/2018 1116   K 3.7 01/09/2017 0913   CL 104 04/13/2018 1116   CL 107 06/29/2012 1131   CO2 22 04/13/2018 1116   CO2 21 (L) 01/09/2017 0913   GLUCOSE 92 04/13/2018 1116   GLUCOSE 80 01/09/2017 0913   GLUCOSE 143 (H)  06/29/2012 1131   BUN 15 04/13/2018 1116   BUN 9.8 01/09/2017 0913   CREATININE 1.69 (H) 04/13/2018 1116   CREATININE 1.5 (H) 01/09/2017 0913   CALCIUM 8.8 (L) 04/13/2018 1116   CALCIUM 7.5 (L) 01/09/2017 0913   PROT 6.7 04/13/2018 1116   PROT 5.7 (L) 01/09/2017 0913   ALBUMIN 2.9 (L) 04/13/2018 1116   ALBUMIN 2.9 (L) 01/09/2017 0913   AST 12 (L) 04/13/2018 1116   AST 12 01/09/2017 0913   ALT 12 04/13/2018 1116   ALT 10 01/09/2017 0913   ALKPHOS 53 04/13/2018 1116   ALKPHOS 55 01/09/2017 0913   BILITOT 1.2 04/13/2018 1116   BILITOT 0.44 01/09/2017 0913   GFRNONAA 39 (L) 04/13/2018 1116   GFRAA 45 (L) 04/13/2018 1116    No results found for: SPEP, UPEP  Lab Results  Component Value Date   WBC 6.1 05/11/2018   NEUTROABS 1.7 05/11/2018   HGB 8.4 (L) 05/11/2018   HCT 26.5 (L) 05/11/2018   MCV 100.4 (H) 05/11/2018   PLT 277 05/11/2018      Chemistry      Component Value Date/Time   NA 134 (L) 04/13/2018 1116   NA 140 01/09/2017 0913   K 3.9 04/13/2018 1116   K 3.7 01/09/2017 0913   CL 104 04/13/2018 1116   CL 107 06/29/2012 1131   CO2 22 04/13/2018 1116   CO2 21 (L) 01/09/2017 0913   BUN 15 04/13/2018 1116   BUN 9.8 01/09/2017 0913   CREATININE 1.69 (H) 04/13/2018 1116   CREATININE 1.5 (H) 01/09/2017 0913      Component Value Date/Time   CALCIUM 8.8 (L) 04/13/2018 1116   CALCIUM 7.5 (L) 01/09/2017 0913   ALKPHOS 53 04/13/2018 1116   ALKPHOS 55 01/09/2017 0913   AST 12 (L) 04/13/2018 1116   AST 12 01/09/2017 0913   ALT 12 04/13/2018 1116   ALT 10 01/09/2017 0913   BILITOT 1.2 04/13/2018 1116   BILITOT 0.44 01/09/2017 0913      All questions were answered. The patient knows to call the clinic with any problems, questions or concerns. No barriers to learning was detected.  I spent 15 minutes counseling the patient face to face. The total time spent in the appointment was 20 minutes and more than 50% was on counseling and review of test results  Heath Lark,  MD 05/11/2018 11:21 AM

## 2018-05-11 NOTE — Progress Notes (Signed)
Per Dr. Alvy Bimler patient to resume zometa today. She has been in contact with dentist also in agreement to resume as patient has dentures and no tooth infection noted.  CorrCa today 8.9mg /dL  Jalene Mullet, PharmD PGY2 Hematology/ Oncology Pharmacy Resident 05/11/2018 12:11 PM

## 2018-05-11 NOTE — Patient Instructions (Signed)

## 2018-05-11 NOTE — Patient Instructions (Signed)
Welcome Discharge Instructions for Patients Receiving Chemotherapy  Today you received the following chemotherapy agents: Darsalex, Zometa  To help prevent nausea and vomiting after your treatment, we encourage you to take your nausea medication: As directed by MD   If you develop nausea and vomiting that is not controlled by your nausea medication, call the clinic.   BELOW ARE SYMPTOMS THAT SHOULD BE REPORTED IMMEDIATELY:  *FEVER GREATER THAN 100.5 F  *CHILLS WITH OR WITHOUT FEVER  NAUSEA AND VOMITING THAT IS NOT CONTROLLED WITH YOUR NAUSEA MEDICATION  *UNUSUAL SHORTNESS OF BREATH  *UNUSUAL BRUISING OR BLEEDING  TENDERNESS IN MOUTH AND THROAT WITH OR WITHOUT PRESENCE OF ULCERS  *URINARY PROBLEMS  *BOWEL PROBLEMS  UNUSUAL RASH Items with * indicate a potential emergency and should be followed up as soon as possible.  Feel free to call the clinic should you have any questions or concerns. The clinic phone number is [phone].  Please show the [place] at check-in to the Emergency Department and triage nurse. Zoledronic Acid injection (Hypercalcemia, Oncology) What is this medicine? ZOLEDRONIC ACID (ZOE le dron ik AS id) lowers the amount of calcium loss from bone. It is used to treat too much calcium in your blood from cancer. It is also used to prevent complications of cancer that has spread to the bone. This medicine may be used for other purposes; ask your health care provider or pharmacist if you have questions. COMMON BRAND NAME(S): Zometa What should I tell my health care provider before I take this medicine? They need to know if you have any of these conditions: -aspirin-sensitive asthma -cancer, especially if you are receiving medicines used to treat cancer -dental disease or wear dentures -infection -kidney disease -receiving corticosteroids like dexamethasone or prednisone -an unusual or allergic reaction to zoledronic acid, other  medicines, foods, dyes, or preservatives -pregnant or trying to get pregnant -breast-feeding How should I use this medicine? This medicine is for infusion into a vein. It is given by a health care professional in a hospital or clinic setting. Talk to your pediatrician regarding the use of this medicine in children. Special care may be needed. Overdosage: If you think you have taken too much of this medicine contact a poison control center or emergency room at once. NOTE: This medicine is only for you. Do not share this medicine with others. What if I miss a dose? It is important not to miss your dose. Call your doctor or health care professional if you are unable to keep an appointment. What may interact with this medicine? -certain antibiotics given by injection -NSAIDs, medicines for pain and inflammation, like ibuprofen or naproxen -some diuretics like bumetanide, furosemide -teriparatide -thalidomide This list may not describe all possible interactions. Give your health care provider a list of all the medicines, herbs, non-prescription drugs, or dietary supplements you use. Also tell them if you smoke, drink alcohol, or use illegal drugs. Some items may interact with your medicine. What should I watch for while using this medicine? Visit your doctor or health care professional for regular checkups. It may be some time before you see the benefit from this medicine. Do not stop taking your medicine unless your doctor tells you to. Your doctor may order blood tests or other tests to see how you are doing. Women should inform their doctor if they wish to become pregnant or think they might be pregnant. There is a potential for serious side effects to an unborn child.  Talk to your health care professional or pharmacist for more information. You should make sure that you get enough calcium and vitamin D while you are taking this medicine. Discuss the foods you eat and the vitamins you take with  your health care professional. Some people who take this medicine have severe bone, joint, and/or muscle pain. This medicine may also increase your risk for jaw problems or a broken thigh bone. Tell your doctor right away if you have severe pain in your jaw, bones, joints, or muscles. Tell your doctor if you have any pain that does not go away or that gets worse. Tell your dentist and dental surgeon that you are taking this medicine. You should not have major dental surgery while on this medicine. See your dentist to have a dental exam and fix any dental problems before starting this medicine. Take good care of your teeth while on this medicine. Make sure you see your dentist for regular follow-up appointments. What side effects may I notice from receiving this medicine? Side effects that you should report to your doctor or health care professional as soon as possible: -allergic reactions like skin rash, itching or hives, swelling of the face, lips, or tongue -anxiety, confusion, or depression -breathing problems -changes in vision -eye pain -feeling faint or lightheaded, falls -jaw pain, especially after dental work -mouth sores -muscle cramps, stiffness, or weakness -redness, blistering, peeling or loosening of the skin, including inside the mouth -trouble passing urine or change in the amount of urine Side effects that usually do not require medical attention (report to your doctor or health care professional if they continue or are bothersome): -bone, joint, or muscle pain -constipation -diarrhea -fever -hair loss -irritation at site where injected -loss of appetite -nausea, vomiting -stomach upset -trouble sleeping -trouble swallowing -weak or tired This list may not describe all possible side effects. Call your doctor for medical advice about side effects. You may report side effects to FDA at 1-800-FDA-1088. Where should I keep my medicine? This drug is given in a hospital or  clinic and will not be stored at home. NOTE: This sheet is a summary. It may not cover all possible information. If you have questions about this medicine, talk to your doctor, pharmacist, or health care provider.  2019 Elsevier/Gold Standard (2013-05-22 14:19:39) Daratumumab injection What is this medicine? DARATUMUMAB (dar a toom ue mab) is a monoclonal antibody. It is used to treat multiple myeloma. This medicine may be used for other purposes; ask your health care provider or pharmacist if you have questions. COMMON BRAND NAME(S): DARZALEX What should I tell my health care provider before I take this medicine? They need to know if you have any of these conditions: -infection (especially a virus infection such as chickenpox, herpes, or hepatitis B virus) -lung or breathing disease -an unusual or allergic reaction to daratumumab, other medicines, foods, dyes, or preservatives -pregnant or trying to get pregnant -breast-feeding How should I use this medicine? This medicine is for infusion into a vein. It is given by a health care professional in a hospital or clinic setting. Talk to your pediatrician regarding the use of this medicine in children. Special care may be needed. Overdosage: If you think you have taken too much of this medicine contact a poison control center or emergency room at once. NOTE: This medicine is only for you. Do not share this medicine with others. What if I miss a dose? Keep appointments for follow-up doses as directed. It is  important not to miss your dose. Call your doctor or health care professional if you are unable to keep an appointment. What may interact with this medicine? Interactions have not been studied. Give your health care provider a list of all the medicines, herbs, non-prescription drugs, or dietary supplements you use. Also tell them if you smoke, drink alcohol, or use illegal drugs. Some items may interact with your medicine. This list may not  describe all possible interactions. Give your health care provider a list of all the medicines, herbs, non-prescription drugs, or dietary supplements you use. Also tell them if you smoke, drink alcohol, or use illegal drugs. Some items may interact with your medicine. What should I watch for while using this medicine? This drug may make you feel generally unwell. Report any side effects. Continue your course of treatment even though you feel ill unless your doctor tells you to stop. This medicine can cause serious allergic reactions. To reduce your risk you may need to take medicine before treatment with this medicine. Take your medicine as directed. This medicine can affect the results of blood tests to match your blood type. These changes can last for up to 6 months after the final dose. Your healthcare provider will do blood tests to match your blood type before you start treatment. Tell all of your healthcare providers that you are being treated with this medicine before receiving a blood transfusion. This medicine can affect the results of some tests used to determine treatment response; extra tests may be needed to evaluate response. Do not become pregnant while taking this medicine or for 3 months after stopping it. Women should inform their doctor if they wish to become pregnant or think they might be pregnant. There is a potential for serious side effects to an unborn child. Talk to your health care professional or pharmacist for more information. What side effects may I notice from receiving this medicine? Side effects that you should report to your doctor or health care professional as soon as possible: -allergic reactions like skin rash, itching or hives, swelling of the face, lips, or tongue -breathing problems -chills -cough -dizziness -feeling faint or lightheaded -headache -low blood counts - this medicine may decrease the number of white blood cells, red blood cells and platelets.  You may be at increased risk for infections and bleeding. -nausea, vomiting -shortness of breath -signs of decreased platelets or bleeding - bruising, pinpoint red spots on the skin, black, tarry stools, blood in the urine -signs of decreased red blood cells - unusually weak or tired, feeling faint or lightheaded, falls -signs of infection - fever or chills, cough, sore throat, pain or difficulty passing urine -signs and symptoms of liver injury like dark yellow or brown urine; general ill feeling or flu-like symptoms; light-colored stools; loss of appetite; right upper belly pain; unusually weak or tired; yellowing of the eyes or skin Side effects that usually do not require medical attention (report to your doctor or health care professional if they continue or are bothersome): -back pain -constipation -loss of appetite -diarrhea -joint pain -muscle cramps -pain, tingling, numbness in the hands or feet -swelling of the ankles, feet, hands -tiredness -trouble sleeping This list may not describe all possible side effects. Call your doctor for medical advice about side effects. You may report side effects to FDA at 1-800-FDA-1088. Where should I keep my medicine? Keep out of the reach of children. This drug is given in a hospital or clinic and will not  be stored at home. NOTE: This sheet is a summary. It may not cover all possible information. If you have questions about this medicine, talk to your doctor, pharmacist, or health care provider.  2019 Elsevier/Gold Standard (2017-07-25 15:52:44)

## 2018-05-12 LAB — MULTIPLE MYELOMA PANEL, SERUM
Albumin SerPl Elph-Mcnc: 3.1 g/dL (ref 2.9–4.4)
Albumin/Glob SerPl: 1.2 (ref 0.7–1.7)
Alpha 1: 0.2 g/dL (ref 0.0–0.4)
Alpha2 Glob SerPl Elph-Mcnc: 0.7 g/dL (ref 0.4–1.0)
B-Globulin SerPl Elph-Mcnc: 0.6 g/dL — ABNORMAL LOW (ref 0.7–1.3)
Gamma Glob SerPl Elph-Mcnc: 1.2 g/dL (ref 0.4–1.8)
Globulin, Total: 2.8 g/dL (ref 2.2–3.9)
IgA: 39 mg/dL — ABNORMAL LOW (ref 61–437)
IgG (Immunoglobin G), Serum: 1355 mg/dL (ref 603–1613)
IgM (Immunoglobulin M), Srm: 6 mg/dL — ABNORMAL LOW (ref 15–143)
M Protein SerPl Elph-Mcnc: 0.9 g/dL — ABNORMAL HIGH
Total Protein ELP: 5.9 g/dL — ABNORMAL LOW (ref 6.0–8.5)

## 2018-05-12 LAB — KAPPA/LAMBDA LIGHT CHAINS
Kappa free light chain: 222.7 mg/L — ABNORMAL HIGH (ref 3.3–19.4)
Kappa, lambda light chain ratio: 18.4 — ABNORMAL HIGH (ref 0.26–1.65)
Lambda free light chains: 12.1 mg/L (ref 5.7–26.3)

## 2018-05-20 ENCOUNTER — Telehealth: Payer: Self-pay

## 2018-05-20 NOTE — Telephone Encounter (Signed)
He called and left a message requesting next appt date and times.  Called back and given date and time. He verbalized understanding.

## 2018-06-02 ENCOUNTER — Other Ambulatory Visit: Payer: Self-pay

## 2018-06-02 DIAGNOSIS — C9002 Multiple myeloma in relapse: Secondary | ICD-10-CM

## 2018-06-02 MED ORDER — POMALIDOMIDE 3 MG PO CAPS: 3.0000 mg | ORAL_CAPSULE | Freq: Every day | ORAL | 11 refills | Status: DC

## 2018-06-08 ENCOUNTER — Inpatient Hospital Stay (HOSPITAL_BASED_OUTPATIENT_CLINIC_OR_DEPARTMENT_OTHER): Payer: Medicare Other | Admitting: Hematology and Oncology

## 2018-06-08 ENCOUNTER — Ambulatory Visit: Payer: Medicare Other

## 2018-06-08 ENCOUNTER — Encounter: Payer: Self-pay | Admitting: Hematology and Oncology

## 2018-06-08 ENCOUNTER — Other Ambulatory Visit: Payer: Self-pay

## 2018-06-08 ENCOUNTER — Inpatient Hospital Stay: Payer: Medicare Other

## 2018-06-08 ENCOUNTER — Inpatient Hospital Stay: Payer: Medicare Other | Attending: Hematology and Oncology

## 2018-06-08 VITALS — BP 120/61 | HR 54 | Temp 97.5°F | Resp 17

## 2018-06-08 DIAGNOSIS — C9002 Multiple myeloma in relapse: Secondary | ICD-10-CM

## 2018-06-08 DIAGNOSIS — Z79899 Other long term (current) drug therapy: Secondary | ICD-10-CM | POA: Diagnosis not present

## 2018-06-08 DIAGNOSIS — M8580 Other specified disorders of bone density and structure, unspecified site: Secondary | ICD-10-CM

## 2018-06-08 DIAGNOSIS — D631 Anemia in chronic kidney disease: Secondary | ICD-10-CM | POA: Diagnosis not present

## 2018-06-08 DIAGNOSIS — Z7982 Long term (current) use of aspirin: Secondary | ICD-10-CM | POA: Insufficient documentation

## 2018-06-08 DIAGNOSIS — N183 Chronic kidney disease, stage 3 unspecified: Secondary | ICD-10-CM

## 2018-06-08 DIAGNOSIS — Z5112 Encounter for antineoplastic immunotherapy: Secondary | ICD-10-CM | POA: Insufficient documentation

## 2018-06-08 DIAGNOSIS — Z9221 Personal history of antineoplastic chemotherapy: Secondary | ICD-10-CM | POA: Insufficient documentation

## 2018-06-08 DIAGNOSIS — I10 Essential (primary) hypertension: Secondary | ICD-10-CM

## 2018-06-08 DIAGNOSIS — C9001 Multiple myeloma in remission: Secondary | ICD-10-CM

## 2018-06-08 DIAGNOSIS — Z95828 Presence of other vascular implants and grafts: Secondary | ICD-10-CM

## 2018-06-08 LAB — COMPREHENSIVE METABOLIC PANEL
ALT: 16 U/L (ref 0–44)
AST: 14 U/L — ABNORMAL LOW (ref 15–41)
Albumin: 3 g/dL — ABNORMAL LOW (ref 3.5–5.0)
Alkaline Phosphatase: 62 U/L (ref 38–126)
Anion gap: 5 (ref 5–15)
BUN: 20 mg/dL (ref 8–23)
CO2: 23 mmol/L (ref 22–32)
Calcium: 8.6 mg/dL — ABNORMAL LOW (ref 8.9–10.3)
Chloride: 109 mmol/L (ref 98–111)
Creatinine, Ser: 1.8 mg/dL — ABNORMAL HIGH (ref 0.61–1.24)
GFR calc Af Amer: 42 mL/min — ABNORMAL LOW (ref >60)
GFR calc non Af Amer: 36 mL/min — ABNORMAL LOW (ref >60)
Glucose, Bld: 77 mg/dL (ref 70–99)
Potassium: 4.5 mmol/L (ref 3.5–5.1)
Sodium: 137 mmol/L (ref 135–145)
Total Bilirubin: 0.5 mg/dL (ref 0.3–1.2)
Total Protein: 6.7 g/dL (ref 6.5–8.1)

## 2018-06-08 LAB — CBC WITH DIFFERENTIAL/PLATELET
Abs Immature Granulocytes: 0.03 10*3/uL (ref 0.00–0.07)
Basophils Absolute: 0.2 10*3/uL — ABNORMAL HIGH (ref 0.0–0.1)
Basophils Relative: 3 %
Eosinophils Absolute: 0.4 10*3/uL (ref 0.0–0.5)
Eosinophils Relative: 7 %
HCT: 27 % — ABNORMAL LOW (ref 39.0–52.0)
Hemoglobin: 8.5 g/dL — ABNORMAL LOW (ref 13.0–17.0)
Immature Granulocytes: 1 %
Lymphocytes Relative: 51 %
Lymphs Abs: 3 10*3/uL (ref 0.7–4.0)
MCH: 31.5 pg (ref 26.0–34.0)
MCHC: 31.5 g/dL (ref 30.0–36.0)
MCV: 100 fL (ref 80.0–100.0)
Monocytes Absolute: 0.5 10*3/uL (ref 0.1–1.0)
Monocytes Relative: 8 %
Neutro Abs: 1.7 10*3/uL (ref 1.7–7.7)
Neutrophils Relative %: 30 %
Platelets: 288 10*3/uL (ref 150–400)
RBC: 2.7 MIL/uL — ABNORMAL LOW (ref 4.22–5.81)
RDW: 15.9 % — ABNORMAL HIGH (ref 11.5–15.5)
WBC: 5.7 10*3/uL (ref 4.0–10.5)
nRBC: 0 % (ref 0.0–0.2)

## 2018-06-08 MED ORDER — DIPHENHYDRAMINE HCL 25 MG PO CAPS
ORAL_CAPSULE | ORAL | Status: AC
  Filled 2018-06-08: qty 2

## 2018-06-08 MED ORDER — HEPARIN SOD (PORK) LOCK FLUSH 100 UNIT/ML IV SOLN
500.0000 [IU] | Freq: Once | INTRAVENOUS | Status: AC | PRN
  Administered 2018-06-08: 500 [IU]
  Filled 2018-06-08: qty 5

## 2018-06-08 MED ORDER — LIDOCAINE-PRILOCAINE 2.5-2.5 % EX CREA: TOPICAL_CREAM | CUTANEOUS | 3 refills | Status: AC

## 2018-06-08 MED ORDER — METHYLPREDNISOLONE SODIUM SUCC 40 MG IJ SOLR
INTRAMUSCULAR | Status: AC
  Filled 2018-06-08: qty 1

## 2018-06-08 MED ORDER — SODIUM CHLORIDE 0.9% FLUSH
10.0000 mL | Freq: Once | INTRAVENOUS | Status: AC
  Administered 2018-06-08: 10 mL
  Filled 2018-06-08: qty 10

## 2018-06-08 MED ORDER — DIPHENHYDRAMINE HCL 25 MG PO CAPS
50.0000 mg | ORAL_CAPSULE | Freq: Once | ORAL | Status: AC
  Administered 2018-06-08: 50 mg via ORAL

## 2018-06-08 MED ORDER — ACETAMINOPHEN 325 MG PO TABS
ORAL_TABLET | ORAL | Status: AC
  Filled 2018-06-08: qty 2

## 2018-06-08 MED ORDER — SODIUM CHLORIDE 0.9% FLUSH
10.0000 mL | INTRAVENOUS | Status: DC | PRN
  Administered 2018-06-08: 10 mL
  Filled 2018-06-08: qty 10

## 2018-06-08 MED ORDER — PROCHLORPERAZINE MALEATE 10 MG PO TABS
ORAL_TABLET | ORAL | Status: AC
  Filled 2018-06-08: qty 1

## 2018-06-08 MED ORDER — ACETAMINOPHEN 325 MG PO TABS
650.0000 mg | ORAL_TABLET | Freq: Once | ORAL | Status: AC
  Administered 2018-06-08: 650 mg via ORAL

## 2018-06-08 MED ORDER — SODIUM CHLORIDE 0.9 % IV SOLN
16.8000 mg/kg | Freq: Once | INTRAVENOUS | Status: AC
  Administered 2018-06-08: 1100 mg via INTRAVENOUS
  Filled 2018-06-08: qty 55

## 2018-06-08 MED ORDER — SODIUM CHLORIDE 0.9 % IV SOLN
Freq: Once | INTRAVENOUS | Status: AC
  Administered 2018-06-08: 13:00:00 via INTRAVENOUS
  Filled 2018-06-08: qty 250

## 2018-06-08 MED ORDER — METHYLPREDNISOLONE SODIUM SUCC 40 MG IJ SOLR
40.0000 mg | Freq: Once | INTRAMUSCULAR | Status: AC
  Administered 2018-06-08: 40 mg via INTRAVENOUS

## 2018-06-08 MED ORDER — PROCHLORPERAZINE MALEATE 10 MG PO TABS
10.0000 mg | ORAL_TABLET | Freq: Once | ORAL | Status: AC
  Administered 2018-06-08: 10 mg via ORAL

## 2018-06-08 NOTE — Assessment & Plan Note (Signed)
He has multifactorial anemia, combination of anemia chronic kidney disease and recent blood loss/iron deficiency anemia His hemoglobin and iron studies are stable.  Continue close observation

## 2018-06-08 NOTE — Assessment & Plan Note (Signed)
I recommend increase oral fluid intake as tolerated We will monitor his kidney function carefully. We will proceed regardless of creatinine function with daratumumab

## 2018-06-08 NOTE — Progress Notes (Signed)
[place] OFFICE PROGRESS NOTE  Patient Care Team: Lucianne Lei, MD as PCP - General (Family Medicine) Heath Lark, MD as Consulting Physician (Hematology and Oncology) Beverely Pace, LCSW as Social Worker (General Practice)  ASSESSMENT & PLAN:  Multiple myeloma in relapse [facility]) His recent myeloma panel was stable.  Recently, I have increased the dose of pomalidomide Repeat myeloma panel from today is pending; the panel from 4 weeks ago showed he had good response to increased dose of Pomalyst We will continue pomalidomide along with monthly daratumumab He will continue acyclovir for antimicrobial prophylaxis He will continue calcium with vitamin D along with Zometa  Anemia in chronic renal disease He has multifactorial anemia, combination of anemia chronic kidney disease and recent blood loss/iron deficiency anemia His hemoglobin and iron studies are stable.  Continue close observation   Chronic renal insufficiency, stage III (moderate) I recommend increase oral fluid intake as tolerated We will monitor his kidney function carefully. We will proceed regardless of creatinine function with daratumumab   No orders of the defined types were placed in this encounter.   INTERVAL HISTORY: Please see below for problem oriented charting. He returns for treatment and follow-up He tolerated recent treatment well No recent infection, fever or chills No new bone pain Denies bleeding complications He has no side effects from treatment so far  SUMMARY OF ONCOLOGIC HISTORY:   Multiple myeloma in relapse ([place])   12/12/2010 Initial Diagnosis    Multiple myeloma    02/16/2014 Imaging    Skeletal survey showed diffuse osteopenia    03/02/2014 Bone Marrow Biopsy    Accession: [id number] BM biopsy showed only 5 % plasma cells. However, the biopsy was difficult and the bone was fragmented during the procedure. Cytogenetics and FISH study is normal    03/03/2014 Procedure     he has port placement    03/10/2014 - 05/19/2014 Chemotherapy    he received elotuzumab and revlimid    05/20/2014 - 02/25/2016 Chemotherapy    Treatment is switched to maintenance Revlimid only. Treatment is stopped due to progressive disease    11/15/2014 Adverse Reaction    He has mild worsening anemia. Does of Revlimid reduced to 5 mg 21 days on, 7 days off    03/06/2016 -  Chemotherapy    He received Daratumumab and Velcade. Velcade is stopped on 07/24/16    08/14/2016 Miscellaneous    Pomalyst is added along with Daratumumab    10/17/2016 Surgery    EGD was performed for coffee-ground emesis. Multiple linear superficial ulcers were noted in the esophagus from 25-35 cm from insertion. Severe esophagitis was noted from 30-35 cm from insertion. Multiple biopsies were obtained. A small hiatal hernia was noted. The gastric cavity contained coffee-ground fluid, after suctioning and lavage, no obvious gastric lesions/erosion/ulceration/erythema was noted. Biopsies were taken from the antrum to rule out H. Pylori. Duodenum bulb appeared unremarkable. A small diverticulum was noted in second portion of the duodenum.      12/06/2016 Imaging    Ct scan abdomen No acute findings.  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  Stable small hiatal hernia.  Colonic diverticulosis, without radiographic evidence of diverticulitis.     REVIEW OF SYSTEMS:   Constitutional: Denies fevers, chills or abnormal weight loss Eyes: Denies blurriness of vision Ears, nose, mouth, throat, and face: Denies mucositis or sore throat Respiratory: Denies cough, dyspnea or wheezes Cardiovascular: Denies palpitation, chest discomfort or lower extremity swelling Gastrointestinal:  Denies nausea,  heartburn or change in bowel habits Skin: Denies abnormal skin rashes Lymphatics: Denies new lymphadenopathy or easy bruising Neurological:Denies numbness, tingling or new  weaknesses Behavioral/Psych: Mood is stable, no new changes  All other systems were reviewed with the patient and are negative.  I have reviewed the past medical history, past surgical history, social history and family history with the patient and they are unchanged from previous note.  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  MEDICATIONS:  Current Outpatient Medications  Medication Sig Dispense Refill  . acyclovir (ZOVIRAX) 400 MG tablet Take 1 tablet (400 mg total) by mouth daily. 60 tablet 11  . allopurinol (ZYLOPRIM) 100 MG tablet Take 100 mg by mouth 2 (two) times daily.     Marland Kitchen amLODipine (NORVASC) 10 MG tablet Take 10 mg by mouth daily.  1  . aspirin EC 81 MG tablet Take 81 mg by mouth daily.    Marland Kitchen atorvastatin (LIPITOR) 20 MG tablet Take 20 mg by mouth at bedtime.     . brimonidine (ALPHAGAN) 0.2 % ophthalmic solution Place 1 drop into the right eye 3 (three) times daily.     . calcium carbonate (TUMS - DOSED IN MG ELEMENTAL CALCIUM) 500 MG chewable tablet Chew 1 tablet by mouth daily as needed for indigestion or heartburn.    . cholecalciferol (VITAMIN D) 1000 UNITS tablet Take 1,000 Units by mouth daily.    . dorzolamide-timolol (COSOPT) 22.3-6.8 MG/ML ophthalmic solution Place 1 drop into the right eye 2 (two) times daily.     . fluorometholone (FML) 0.1 % ophthalmic suspension Place 1 drop into the right eye daily.     Marland Kitchen latanoprost (XALATAN) 0.005 % ophthalmic solution Place 1 drop into the right eye at bedtime.    . lidocaine-prilocaine (EMLA) cream Apply to affected area once 30 g 3  . loperamide (IMODIUM A-D) 2 MG tablet 1 po after each watery BM.  Maximum 6 per 24hrs. (Patient not taking: Reported on 03/19/2018) 30 tablet PRN  . magnesium oxide (MAG-OX) 400 (241.3 Mg) MG tablet Take 1 tablet (400 mg total) by mouth 2 (two) times daily. 60 tablet 9  . magnesium oxide (MAG-OX) 400 MG tablet TAKE 1 TABLET BY MOUTH TWICE A DAY 180 tablet 3  . ondansetron (ZOFRAN) 4 MG  tablet Take 1 tablet (4 mg total) by mouth every 6 (six) hours. 12 tablet 0  . ondansetron (ZOFRAN) 8 MG tablet Take 1 tablet (8 mg total) by mouth 2 (two) times daily as needed (Nausea or vomiting). 30 tablet 1  . pantoprazole (PROTONIX) 40 MG tablet Take 1 tablet (40 mg total) by mouth daily. (Patient not taking: Reported on 03/19/2018) 180 tablet 3  . pomalidomide (POMALYST) 3 MG capsule Take 1 capsule (3 mg total) by mouth daily. Take with water on days 1-21. Repeat every 28 days. 21 capsule 11   No current facility-administered medications for this visit.     PHYSICAL EXAMINATION: ECOG PERFORMANCE STATUS: 1 - Symptomatic but completely ambulatory  Vitals:   06/08/18 1216  BP: 133/60  Pulse: (!) 56  Resp: 18  Temp: 98.7 F (37.1 C)  SpO2: 100%   Filed Weights   06/08/18 1216  Weight: 167 lb 3.2 oz (75.8 kg)    GENERAL:alert, no distress and comfortable SKIN: skin color, texture, turgor are normal, no rashes or significant lesions EYES: normal, Conjunctiva are pink and non-injected, sclera clear OROPHARYNX:no exudate, no erythema and lips, buccal mucosa, and tongue normal  NECK: supple, thyroid  normal size, non-tender, without nodularity LYMPH:  no palpable lymphadenopathy in the cervical, axillary or inguinal LUNGS: clear to auscultation and percussion with normal breathing effort HEART: regular rate & rhythm and no murmurs and no lower extremity edema ABDOMEN:abdomen soft, non-tender and normal bowel sounds Musculoskeletal:no cyanosis of digits and no clubbing  NEURO: alert & oriented x 3 with fluent speech, no focal motor/sensory deficits  LABORATORY DATA:  I have reviewed the data as listed    Component Value Date/Time   NA 137 06/08/2018 1155   NA 140 01/09/2017 0913   K 4.5 06/08/2018 1155   K 3.7 01/09/2017 0913   CL 109 06/08/2018 1155   CL 107 06/29/2012 1131   CO2 23 06/08/2018 1155   CO2 21 (L) 01/09/2017 0913   GLUCOSE 77 06/08/2018 1155   GLUCOSE 80  01/09/2017 0913   GLUCOSE 143 (H) 06/29/2012 1131   BUN 20 06/08/2018 1155   BUN 9.8 01/09/2017 0913   CREATININE 1.80 (H) 06/08/2018 1155   CREATININE 1.5 (H) 01/09/2017 0913   CALCIUM 8.6 (L) 06/08/2018 1155   CALCIUM 7.5 (L) 01/09/2017 0913   PROT 6.7 06/08/2018 1155   PROT 5.7 (L) 01/09/2017 0913   ALBUMIN 3.0 (L) 06/08/2018 1155   ALBUMIN 2.9 (L) 01/09/2017 0913   AST 14 (L) 06/08/2018 1155   AST 12 01/09/2017 0913   ALT 16 06/08/2018 1155   ALT 10 01/09/2017 0913   ALKPHOS 62 06/08/2018 1155   ALKPHOS 55 01/09/2017 0913   BILITOT 0.5 06/08/2018 1155   BILITOT 0.44 01/09/2017 0913   GFRNONAA 36 (L) 06/08/2018 1155   GFRAA 42 (L) 06/08/2018 1155    No results found for: SPEP, UPEP  Lab Results  Component Value Date   WBC 5.7 06/08/2018   NEUTROABS 1.7 06/08/2018   HGB 8.5 (L) 06/08/2018   HCT 27.0 (L) 06/08/2018   MCV 100.0 06/08/2018   PLT 288 06/08/2018      Chemistry      Component Value Date/Time   NA 137 06/08/2018 1155   NA 140 01/09/2017 0913   K 4.5 06/08/2018 1155   K 3.7 01/09/2017 0913   CL 109 06/08/2018 1155   CL 107 06/29/2012 1131   CO2 23 06/08/2018 1155   CO2 21 (L) 01/09/2017 0913   BUN 20 06/08/2018 1155   BUN 9.8 01/09/2017 0913   CREATININE 1.80 (H) 06/08/2018 1155   CREATININE 1.5 (H) 01/09/2017 0913      Component Value Date/Time   CALCIUM 8.6 (L) 06/08/2018 1155   CALCIUM 7.5 (L) 01/09/2017 0913   ALKPHOS 62 06/08/2018 1155   ALKPHOS 55 01/09/2017 0913   AST 14 (L) 06/08/2018 1155   AST 12 01/09/2017 0913   ALT 16 06/08/2018 1155   ALT 10 01/09/2017 0913   BILITOT 0.5 06/08/2018 1155   BILITOT 0.44 01/09/2017 0913      All questions were answered. The patient knows to call the clinic with any problems, questions or concerns. No barriers to learning was detected.  I spent 15 minutes counseling the patient face to face. The total time spent in the appointment was 20 minutes and more than 50% was on counseling and review of  test results  Heath Lark, MD 06/08/2018 12:31 PM

## 2018-06-08 NOTE — Assessment & Plan Note (Signed)
His recent myeloma panel was stable.  Recently, I have increased the dose of pomalidomide Repeat myeloma panel from today is pending; the panel from 4 weeks ago showed he had good response to increased dose of Pomalyst We will continue pomalidomide along with monthly daratumumab He will continue acyclovir for antimicrobial prophylaxis He will continue calcium with vitamin D along with Zometa

## 2018-06-08 NOTE — Patient Instructions (Signed)
[facility] Discharge Instructions for Patients Receiving Chemotherapy  Today you received the following chemotherapy agents: Darzalex  To help prevent nausea and vomiting after your treatment, we encourage you to take your nausea medication as directed.    If you develop nausea and vomiting that is not controlled by your nausea medication, call the clinic.   BELOW ARE SYMPTOMS THAT SHOULD BE REPORTED IMMEDIATELY:  *FEVER GREATER THAN 100.5 F  *CHILLS WITH OR WITHOUT FEVER  NAUSEA AND VOMITING THAT IS NOT CONTROLLED WITH YOUR NAUSEA MEDICATION  *UNUSUAL SHORTNESS OF BREATH  *UNUSUAL BRUISING OR BLEEDING  TENDERNESS IN MOUTH AND THROAT WITH OR WITHOUT PRESENCE OF ULCERS  *URINARY PROBLEMS  *BOWEL PROBLEMS  UNUSUAL RASH Items with * indicate a potential emergency and should be followed up as soon as possible.  Feel free to call the clinic should you have any questions or concerns. The clinic phone number is [phone].  Please show the CHEMO ALERT CARD at check-in to the Emergency Department and triage nurse.   

## 2018-06-08 NOTE — Patient Instructions (Signed)

## 2018-06-09 ENCOUNTER — Other Ambulatory Visit: Payer: Self-pay | Admitting: Hematology and Oncology

## 2018-06-09 ENCOUNTER — Telehealth: Payer: Self-pay | Admitting: Hematology and Oncology

## 2018-06-09 LAB — MULTIPLE MYELOMA PANEL, SERUM
Albumin SerPl Elph-Mcnc: 3.1 g/dL (ref 2.9–4.4)
Albumin/Glob SerPl: 1 (ref 0.7–1.7)
Alpha 1: 0.3 g/dL (ref 0.0–0.4)
Alpha2 Glob SerPl Elph-Mcnc: 0.8 g/dL (ref 0.4–1.0)
B-Globulin SerPl Elph-Mcnc: 0.8 g/dL (ref 0.7–1.3)
Gamma Glob SerPl Elph-Mcnc: 1.3 g/dL (ref 0.4–1.8)
Globulin, Total: 3.2 g/dL (ref 2.2–3.9)
IgA: 36 mg/dL — ABNORMAL LOW (ref 61–437)
IgG (Immunoglobin G), Serum: 1551 mg/dL (ref 603–1613)
IgM (Immunoglobulin M), Srm: <5 mg/dL — ABNORMAL LOW (ref 15–143)
M Protein SerPl Elph-Mcnc: 1.1 g/dL — ABNORMAL HIGH
Total Protein ELP: 6.3 g/dL (ref 6.0–8.5)

## 2018-06-09 LAB — KAPPA/LAMBDA LIGHT CHAINS
Kappa free light chain: 254.5 mg/L — ABNORMAL HIGH (ref 3.3–19.4)
Kappa, lambda light chain ratio: 21.94 — ABNORMAL HIGH (ref 0.26–1.65)
Lambda free light chains: 11.6 mg/L (ref 5.7–26.3)

## 2018-06-09 NOTE — Telephone Encounter (Signed)
I could not reach patient no voicemail. Will mail schedule

## 2018-06-11 DIAGNOSIS — I10 Essential (primary) hypertension: Secondary | ICD-10-CM | POA: Diagnosis not present

## 2018-06-11 DIAGNOSIS — Z0000 Encounter for general adult medical examination without abnormal findings: Secondary | ICD-10-CM | POA: Diagnosis not present

## 2018-06-11 DIAGNOSIS — C9000 Multiple myeloma not having achieved remission: Secondary | ICD-10-CM | POA: Diagnosis not present

## 2018-07-01 ENCOUNTER — Other Ambulatory Visit: Payer: Self-pay

## 2018-07-01 DIAGNOSIS — C9002 Multiple myeloma in relapse: Secondary | ICD-10-CM

## 2018-07-01 MED ORDER — POMALIDOMIDE 3 MG PO CAPS: 3.0000 mg | ORAL_CAPSULE | Freq: Every day | ORAL | 11 refills | Status: DC

## 2018-07-06 ENCOUNTER — Other Ambulatory Visit: Payer: Self-pay | Admitting: Hematology and Oncology

## 2018-07-06 ENCOUNTER — Encounter: Payer: Self-pay | Admitting: Hematology and Oncology

## 2018-07-06 ENCOUNTER — Inpatient Hospital Stay (HOSPITAL_BASED_OUTPATIENT_CLINIC_OR_DEPARTMENT_OTHER): Payer: Medicare Other | Admitting: Hematology and Oncology

## 2018-07-06 ENCOUNTER — Inpatient Hospital Stay: Payer: Medicare Other

## 2018-07-06 ENCOUNTER — Other Ambulatory Visit: Payer: Self-pay

## 2018-07-06 VITALS — BP 118/59 | HR 54 | Temp 98.0°F | Resp 18

## 2018-07-06 DIAGNOSIS — C9002 Multiple myeloma in relapse: Secondary | ICD-10-CM

## 2018-07-06 DIAGNOSIS — M8580 Other specified disorders of bone density and structure, unspecified site: Secondary | ICD-10-CM | POA: Diagnosis not present

## 2018-07-06 DIAGNOSIS — Z7982 Long term (current) use of aspirin: Secondary | ICD-10-CM

## 2018-07-06 DIAGNOSIS — Z79899 Other long term (current) drug therapy: Secondary | ICD-10-CM

## 2018-07-06 DIAGNOSIS — Z9221 Personal history of antineoplastic chemotherapy: Secondary | ICD-10-CM | POA: Diagnosis not present

## 2018-07-06 DIAGNOSIS — Z95828 Presence of other vascular implants and grafts: Secondary | ICD-10-CM

## 2018-07-06 DIAGNOSIS — N183 Chronic kidney disease, stage 3 unspecified: Secondary | ICD-10-CM

## 2018-07-06 DIAGNOSIS — D631 Anemia in chronic kidney disease: Secondary | ICD-10-CM

## 2018-07-06 DIAGNOSIS — I10 Essential (primary) hypertension: Secondary | ICD-10-CM | POA: Diagnosis not present

## 2018-07-06 DIAGNOSIS — Z5112 Encounter for antineoplastic immunotherapy: Secondary | ICD-10-CM | POA: Diagnosis not present

## 2018-07-06 LAB — CMP (CANCER CENTER ONLY)
ALT: 11 U/L (ref 0–44)
AST: 14 U/L — ABNORMAL LOW (ref 15–41)
Albumin: 3.3 g/dL — ABNORMAL LOW (ref 3.5–5.0)
Alkaline Phosphatase: 54 U/L (ref 38–126)
Anion gap: 7 (ref 5–15)
BUN: 11 mg/dL (ref 8–23)
CO2: 22 mmol/L (ref 22–32)
Calcium: 8.3 mg/dL — ABNORMAL LOW (ref 8.9–10.3)
Chloride: 110 mmol/L (ref 98–111)
Creatinine: 1.85 mg/dL — ABNORMAL HIGH (ref 0.61–1.24)
GFR, Est AFR Am: 40 mL/min — ABNORMAL LOW (ref >60)
GFR, Estimated: 35 mL/min — ABNORMAL LOW (ref >60)
Glucose, Bld: 85 mg/dL (ref 70–99)
Potassium: 4.1 mmol/L (ref 3.5–5.1)
Sodium: 139 mmol/L (ref 135–145)
Total Bilirubin: 0.5 mg/dL (ref 0.3–1.2)
Total Protein: 6.7 g/dL (ref 6.5–8.1)

## 2018-07-06 LAB — CBC WITH DIFFERENTIAL/PLATELET
Abs Immature Granulocytes: 0.05 10*3/uL (ref 0.00–0.07)
Basophils Absolute: 0.2 10*3/uL — ABNORMAL HIGH (ref 0.0–0.1)
Basophils Relative: 2 %
Eosinophils Absolute: 0.3 10*3/uL (ref 0.0–0.5)
Eosinophils Relative: 4 %
HCT: 27.4 % — ABNORMAL LOW (ref 39.0–52.0)
Hemoglobin: 8.9 g/dL — ABNORMAL LOW (ref 13.0–17.0)
Immature Granulocytes: 1 %
Lymphocytes Relative: 56 %
Lymphs Abs: 4.1 10*3/uL — ABNORMAL HIGH (ref 0.7–4.0)
MCH: 31.8 pg (ref 26.0–34.0)
MCHC: 32.5 g/dL (ref 30.0–36.0)
MCV: 97.9 fL (ref 80.0–100.0)
Monocytes Absolute: 0.7 10*3/uL (ref 0.1–1.0)
Monocytes Relative: 10 %
Neutro Abs: 2 10*3/uL (ref 1.7–7.7)
Neutrophils Relative %: 27 %
Platelets: 253 10*3/uL (ref 150–400)
RBC: 2.8 MIL/uL — ABNORMAL LOW (ref 4.22–5.81)
RDW: 16 % — ABNORMAL HIGH (ref 11.5–15.5)
WBC: 7.4 10*3/uL (ref 4.0–10.5)
nRBC: 0 % (ref 0.0–0.2)

## 2018-07-06 MED ORDER — SODIUM CHLORIDE 0.9 % IV SOLN
15.4000 mg/kg | Freq: Once | INTRAVENOUS | Status: AC
  Administered 2018-07-06: 1200 mg via INTRAVENOUS
  Filled 2018-07-06: qty 60

## 2018-07-06 MED ORDER — SODIUM CHLORIDE 0.9% FLUSH
10.0000 mL | Freq: Once | INTRAVENOUS | Status: AC
  Administered 2018-07-06: 10 mL
  Filled 2018-07-06: qty 10

## 2018-07-06 MED ORDER — METHYLPREDNISOLONE SODIUM SUCC 40 MG IJ SOLR
INTRAMUSCULAR | Status: AC
  Filled 2018-07-06: qty 1

## 2018-07-06 MED ORDER — ACETAMINOPHEN 325 MG PO TABS
ORAL_TABLET | ORAL | Status: AC
  Filled 2018-07-06: qty 2

## 2018-07-06 MED ORDER — PROCHLORPERAZINE MALEATE 10 MG PO TABS
10.0000 mg | ORAL_TABLET | Freq: Once | ORAL | Status: AC
  Administered 2018-07-06: 10 mg via ORAL

## 2018-07-06 MED ORDER — SODIUM CHLORIDE 0.9% FLUSH
10.0000 mL | INTRAVENOUS | Status: DC | PRN
  Administered 2018-07-06: 10 mL
  Filled 2018-07-06: qty 10

## 2018-07-06 MED ORDER — DIPHENHYDRAMINE HCL 25 MG PO CAPS
50.0000 mg | ORAL_CAPSULE | Freq: Once | ORAL | Status: AC
  Administered 2018-07-06: 50 mg via ORAL

## 2018-07-06 MED ORDER — PROCHLORPERAZINE MALEATE 10 MG PO TABS
ORAL_TABLET | ORAL | Status: AC
  Filled 2018-07-06: qty 1

## 2018-07-06 MED ORDER — METHYLPREDNISOLONE SODIUM SUCC 40 MG IJ SOLR
40.0000 mg | Freq: Once | INTRAMUSCULAR | Status: AC
  Administered 2018-07-06: 40 mg via INTRAVENOUS

## 2018-07-06 MED ORDER — ACETAMINOPHEN 325 MG PO TABS
650.0000 mg | ORAL_TABLET | Freq: Once | ORAL | Status: AC
  Administered 2018-07-06: 650 mg via ORAL

## 2018-07-06 MED ORDER — DIPHENHYDRAMINE HCL 25 MG PO CAPS
ORAL_CAPSULE | ORAL | Status: AC
  Filled 2018-07-06: qty 2

## 2018-07-06 MED ORDER — SODIUM CHLORIDE 0.9 % IV SOLN: 16.8000 mg/kg | Freq: Once | INTRAVENOUS | Status: DC

## 2018-07-06 MED ORDER — SODIUM CHLORIDE 0.9 % IV SOLN
Freq: Once | INTRAVENOUS | Status: AC
  Administered 2018-07-06: 11:00:00 via INTRAVENOUS
  Filled 2018-07-06: qty 250

## 2018-07-06 MED ORDER — HEPARIN SOD (PORK) LOCK FLUSH 100 UNIT/ML IV SOLN
500.0000 [IU] | Freq: Once | INTRAVENOUS | Status: AC | PRN
  Administered 2018-07-06: 500 [IU]
  Filled 2018-07-06: qty 5

## 2018-07-06 NOTE — Assessment & Plan Note (Signed)
I recommend increase oral fluid intake as tolerated We will monitor his kidney function carefully. We will proceed regardless of creatinine function with daratumumab

## 2018-07-06 NOTE — Progress Notes (Signed)
[place] OFFICE PROGRESS NOTE  Patient Care Team: Lucianne Lei, MD as PCP - General (Family Medicine) Heath Lark, MD as Consulting Physician (Hematology and Oncology) Beverely Pace, LCSW as Social Worker (General Practice)  ASSESSMENT & PLAN:  Multiple myeloma in relapse [facility]) His recent myeloma panel was stable.  Recently, I have increased the dose of pomalidomide Repeat myeloma panel from today is pending; the panel from 4 weeks ago showed stable disease control We will continue pomalidomide along with monthly daratumumab He will continue acyclovir for antimicrobial prophylaxis He will continue calcium with vitamin D along with Zometa  Anemia in chronic renal disease He has multifactorial anemia, combination of anemia chronic kidney disease and recent blood loss/iron deficiency anemia His hemoglobin and iron studies are stable.  Continue close observation   Chronic renal insufficiency, stage III (moderate) I recommend increase oral fluid intake as tolerated We will monitor his kidney function carefully. We will proceed regardless of creatinine function with daratumumab   No orders of the defined types were placed in this encounter.   INTERVAL HISTORY: Please see below for problem oriented charting. He returns for further follow-up He feels well No side effects from treatment so far No recent infection, fever or chills No new bone pain  SUMMARY OF ONCOLOGIC HISTORY: Oncology History  Multiple myeloma in relapse ([place])  12/12/2010 Initial Diagnosis   Multiple myeloma   02/16/2014 Imaging   Skeletal survey showed diffuse osteopenia   03/02/2014 Bone Marrow Biopsy   Accession: [id number] BM biopsy showed only 5 % plasma cells. However, the biopsy was difficult and the bone was fragmented during the procedure. Cytogenetics and FISH study is normal   03/03/2014 Procedure   he has port placement   03/10/2014 - 05/19/2014 Chemotherapy   he received  elotuzumab and revlimid   05/20/2014 - 02/25/2016 Chemotherapy   Treatment is switched to maintenance Revlimid only. Treatment is stopped due to progressive disease   11/15/2014 Adverse Reaction   He has mild worsening anemia. Does of Revlimid reduced to 5 mg 21 days on, 7 days off   03/06/2016 -  Chemotherapy   He received Daratumumab and Velcade. Velcade is stopped on 07/24/16   08/14/2016 Miscellaneous   Pomalyst is added along with Daratumumab   10/17/2016 Surgery   EGD was performed for coffee-ground emesis. Multiple linear superficial ulcers were noted in the esophagus from 25-35 cm from insertion. Severe esophagitis was noted from 30-35 cm from insertion. Multiple biopsies were obtained. A small hiatal hernia was noted. The gastric cavity contained coffee-ground fluid, after suctioning and lavage, no obvious gastric lesions/erosion/ulceration/erythema was noted. Biopsies were taken from the antrum to rule out H. Pylori. Duodenum bulb appeared unremarkable. A small diverticulum was noted in second portion of the duodenum.     12/06/2016 Imaging   Ct scan abdomen No acute findings.  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  Stable small hiatal hernia.  Colonic diverticulosis, without radiographic evidence of diverticulitis.     REVIEW OF SYSTEMS:   Constitutional: Denies fevers, chills or abnormal weight loss Eyes: Denies blurriness of vision Ears, nose, mouth, throat, and face: Denies mucositis or sore throat Respiratory: Denies cough, dyspnea or wheezes Cardiovascular: Denies palpitation, chest discomfort or lower extremity swelling Gastrointestinal:  Denies nausea, heartburn or change in bowel habits Skin: Denies abnormal skin rashes Lymphatics: Denies new lymphadenopathy or easy bruising Neurological:Denies numbness, tingling or new weaknesses Behavioral/Psych: Mood is stable, no new changes  All other systems  were reviewed with  the patient and are negative.  I have reviewed the past medical history, past surgical history, social history and family history with the patient and they are unchanged from previous note.  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  MEDICATIONS:  Current Outpatient Medications  Medication Sig Dispense Refill  . acyclovir (ZOVIRAX) 400 MG tablet Take 1 tablet (400 mg total) by mouth daily. 60 tablet 11  . allopurinol (ZYLOPRIM) 100 MG tablet Take 100 mg by mouth 2 (two) times daily.     Marland Kitchen amLODipine (NORVASC) 10 MG tablet Take 10 mg by mouth daily.  1  . aspirin EC 81 MG tablet Take 81 mg by mouth daily.    Marland Kitchen atorvastatin (LIPITOR) 20 MG tablet Take 20 mg by mouth at bedtime.     . brimonidine (ALPHAGAN) 0.2 % ophthalmic solution Place 1 drop into the right eye 3 (three) times daily.     . calcium carbonate (TUMS - DOSED IN MG ELEMENTAL CALCIUM) 500 MG chewable tablet Chew 1 tablet by mouth daily as needed for indigestion or heartburn.    . cholecalciferol (VITAMIN D) 1000 UNITS tablet Take 1,000 Units by mouth daily.    . dorzolamide-timolol (COSOPT) 22.3-6.8 MG/ML ophthalmic solution Place 1 drop into the right eye 2 (two) times daily.     . fluorometholone (FML) 0.1 % ophthalmic suspension Place 1 drop into the right eye daily.     Marland Kitchen latanoprost (XALATAN) 0.005 % ophthalmic solution Place 1 drop into the right eye at bedtime.    . lidocaine-prilocaine (EMLA) cream Apply to affected area once 30 g 3  . loperamide (IMODIUM A-D) 2 MG tablet 1 po after each watery BM.  Maximum 6 per 24hrs. (Patient not taking: Reported on 03/19/2018) 30 tablet PRN  . magnesium oxide (MAG-OX) 400 (241.3 Mg) MG tablet Take 1 tablet (400 mg total) by mouth 2 (two) times daily. 60 tablet 9  . magnesium oxide (MAG-OX) 400 MG tablet TAKE 1 TABLET BY MOUTH TWICE A DAY 180 tablet 3  . ondansetron (ZOFRAN) 4 MG tablet Take 1 tablet (4 mg total) by mouth every 6 (six) hours. 12 tablet 0  . ondansetron  (ZOFRAN) 8 MG tablet Take 1 tablet (8 mg total) by mouth 2 (two) times daily as needed (Nausea or vomiting). 30 tablet 1  . pantoprazole (PROTONIX) 40 MG tablet Take 1 tablet (40 mg total) by mouth daily. (Patient not taking: Reported on 03/19/2018) 180 tablet 3  . pomalidomide (POMALYST) 3 MG capsule Take 1 capsule (3 mg total) by mouth daily. Take with water on days 1-21. Repeat every 28 days. 21 capsule 11   No current facility-administered medications for this visit.    Facility-Administered Medications Ordered in Other Visits  Medication Dose Route Frequency Provider Last Rate Last Dose  . heparin lock flush 100 unit/mL  500 Units Intracatheter Once PRN Alvy Bimler, Shiree Altemus, MD      . sodium chloride flush (NS) 0.9 % injection 10 mL  10 mL Intracatheter PRN Alvy Bimler, Marvella Jenning, MD        PHYSICAL EXAMINATION: ECOG PERFORMANCE STATUS: 1 - Symptomatic but completely ambulatory  Vitals:   07/06/18 0958  BP: (!) 143/56  Pulse: 62  Resp: 17  Temp: 98 F (36.7 C)  SpO2: 100%   Filed Weights   07/06/18 0958  Weight: 170 lb 12.8 oz (77.5 kg)    GENERAL:alert, no distress and comfortable SKIN: skin color, texture, turgor are normal, no rashes or  significant lesions EYES: normal, Conjunctiva are pink and non-injected, sclera clear OROPHARYNX:no exudate, no erythema and lips, buccal mucosa, and tongue normal  NECK: supple, thyroid normal size, non-tender, without nodularity LYMPH:  no palpable lymphadenopathy in the cervical, axillary or inguinal LUNGS: clear to auscultation and percussion with normal breathing effort HEART: regular rate & rhythm and no murmurs and no lower extremity edema ABDOMEN:abdomen soft, non-tender and normal bowel sounds Musculoskeletal:no cyanosis of digits and no clubbing  NEURO: alert & oriented x 3 with fluent speech, no focal motor/sensory deficits  LABORATORY DATA:  I have reviewed the data as listed    Component Value Date/Time   NA 139 07/06/2018 0935   NA 140  01/09/2017 0913   K 4.1 07/06/2018 0935   K 3.7 01/09/2017 0913   CL 110 07/06/2018 0935   CL 107 06/29/2012 1131   CO2 22 07/06/2018 0935   CO2 21 (L) 01/09/2017 0913   GLUCOSE 85 07/06/2018 0935   GLUCOSE 80 01/09/2017 0913   GLUCOSE 143 (H) 06/29/2012 1131   BUN 11 07/06/2018 0935   BUN 9.8 01/09/2017 0913   CREATININE 1.85 (H) 07/06/2018 0935   CREATININE 1.5 (H) 01/09/2017 0913   CALCIUM 8.3 (L) 07/06/2018 0935   CALCIUM 7.5 (L) 01/09/2017 0913   PROT 6.7 07/06/2018 0935   PROT 5.7 (L) 01/09/2017 0913   ALBUMIN 3.3 (L) 07/06/2018 0935   ALBUMIN 2.9 (L) 01/09/2017 0913   AST 14 (L) 07/06/2018 0935   AST 12 01/09/2017 0913   ALT 11 07/06/2018 0935   ALT 10 01/09/2017 0913   ALKPHOS 54 07/06/2018 0935   ALKPHOS 55 01/09/2017 0913   BILITOT 0.5 07/06/2018 0935   BILITOT 0.44 01/09/2017 0913   GFRNONAA 35 (L) 07/06/2018 0935   GFRAA 40 (L) 07/06/2018 0935    No results found for: SPEP, UPEP  Lab Results  Component Value Date   WBC 7.4 07/06/2018   NEUTROABS 2.0 07/06/2018   HGB 8.9 (L) 07/06/2018   HCT 27.4 (L) 07/06/2018   MCV 97.9 07/06/2018   PLT 253 07/06/2018      Chemistry      Component Value Date/Time   NA 139 07/06/2018 0935   NA 140 01/09/2017 0913   K 4.1 07/06/2018 0935   K 3.7 01/09/2017 0913   CL 110 07/06/2018 0935   CL 107 06/29/2012 1131   CO2 22 07/06/2018 0935   CO2 21 (L) 01/09/2017 0913   BUN 11 07/06/2018 0935   BUN 9.8 01/09/2017 0913   CREATININE 1.85 (H) 07/06/2018 0935   CREATININE 1.5 (H) 01/09/2017 0913      Component Value Date/Time   CALCIUM 8.3 (L) 07/06/2018 0935   CALCIUM 7.5 (L) 01/09/2017 0913   ALKPHOS 54 07/06/2018 0935   ALKPHOS 55 01/09/2017 0913   AST 14 (L) 07/06/2018 0935   AST 12 01/09/2017 0913   ALT 11 07/06/2018 0935   ALT 10 01/09/2017 0913   BILITOT 0.5 07/06/2018 0935   BILITOT 0.44 01/09/2017 0913       All questions were answered. The patient knows to call the clinic with any problems,  questions or concerns. No barriers to learning was detected.  I spent 15 minutes counseling the patient face to face. The total time spent in the appointment was 20 minutes and more than 50% was on counseling and review of test results  Heath Lark, MD 07/06/2018 12:39 PM

## 2018-07-06 NOTE — Assessment & Plan Note (Signed)
His recent myeloma panel was stable.  Recently, I have increased the dose of pomalidomide Repeat myeloma panel from today is pending; the panel from 4 weeks ago showed stable disease control We will continue pomalidomide along with monthly daratumumab He will continue acyclovir for antimicrobial prophylaxis He will continue calcium with vitamin D along with Zometa

## 2018-07-06 NOTE — Patient Instructions (Signed)
[facility] Discharge Instructions for Patients Receiving Chemotherapy  Today you received the following chemotherapy agents: Darzalex  To help prevent nausea and vomiting after your treatment, we encourage you to take your nausea medication as directed.    If you develop nausea and vomiting that is not controlled by your nausea medication, call the clinic.   BELOW ARE SYMPTOMS THAT SHOULD BE REPORTED IMMEDIATELY:  *FEVER GREATER THAN 100.5 F  *CHILLS WITH OR WITHOUT FEVER  NAUSEA AND VOMITING THAT IS NOT CONTROLLED WITH YOUR NAUSEA MEDICATION  *UNUSUAL SHORTNESS OF BREATH  *UNUSUAL BRUISING OR BLEEDING  TENDERNESS IN MOUTH AND THROAT WITH OR WITHOUT PRESENCE OF ULCERS  *URINARY PROBLEMS  *BOWEL PROBLEMS  UNUSUAL RASH Items with * indicate a potential emergency and should be followed up as soon as possible.  Feel free to call the clinic should you have any questions or concerns. The clinic phone number is [phone].  Please show the [facility] at check-in to the Emergency Department and triage nurse.  Coronavirus (COVID-19) Are you at risk?  Are you at risk for the Coronavirus (COVID-19)?  To be considered HIGH RISK for Coronavirus (COVID-19), you have to meet the following criteria:  . Traveled to [place], [place], [place], [place] or [place]; or in the [place] to [place], [place], [place], or [place]; and have fever, cough, and shortness of breath within the last 2 weeks of travel OR . Been in close contact with a person diagnosed with COVID-19 within the last 2 weeks and have fever, cough, and shortness of breath . IF YOU DO NOT MEET THESE CRITERIA, YOU ARE CONSIDERED LOW RISK FOR COVID-19.  What to do if you are HIGH RISK for COVID-19?  Marland Kitchen If you are having a medical emergency, call 911. . Seek medical care right away. Before you go to a doctor's office, urgent care or emergency department, call ahead and tell them about your  recent travel, contact with someone diagnosed with COVID-19, and your symptoms. You should receive instructions from your physician's office regarding next steps of care.  . When you arrive at healthcare provider, tell the healthcare staff immediately you have returned from visiting [place], [place], [place], [place] or [place]; or traveled in the [place] to [place], [place], [place], or [place]; in the last two weeks or you have been in close contact with a person diagnosed with COVID-19 in the last 2 weeks.   . Tell the health care staff about your symptoms: fever, cough and shortness of breath. . After you have been seen by a medical provider, you will be either: o Tested for (COVID-19) and discharged home on quarantine except to seek medical care if symptoms worsen, and asked to  - Stay home and avoid contact with others until you get your results (4-5 days)  - Avoid travel on public transportation if possible (such as bus, train, or airplane) or o Sent to the Emergency Department by EMS for evaluation, COVID-19 testing, and possible admission depending on your condition and test results.  What to do if you are LOW RISK for COVID-19?  Reduce your risk of any infection by using the same precautions used for avoiding the common cold or flu:  Marland Kitchen Wash your hands often with soap and warm water for at least 20 seconds.  If soap and water are not readily available, use an alcohol-based hand sanitizer with at least 60% alcohol.  . If coughing or  sneezing, cover your mouth and nose by coughing or sneezing into the elbow areas of your shirt or coat, into a tissue or into your sleeve (not your hands). . Avoid shaking hands with others and consider head nods or verbal greetings only. . Avoid touching your eyes, nose, or mouth with unwashed hands.  . Avoid close contact with people who are sick. . Avoid places or events with large numbers of people in one location, like concerts or sporting  events. . Carefully consider travel plans you have or are making. . If you are planning any travel outside or inside the [place], visit the CDC's Travelers' Health webpage for the latest health notices. . If you have some symptoms but not all symptoms, continue to monitor at home and seek medical attention if your symptoms worsen. . If you are having a medical emergency, call 911.   [place] / e-Visit: [email or web address]         [facility]: [facility]: [phone]                   [facility]: [phone]

## 2018-07-06 NOTE — Assessment & Plan Note (Signed)
He has multifactorial anemia, combination of anemia chronic kidney disease and recent blood loss/iron deficiency anemia His hemoglobin and iron studies are stable.  Continue close observation

## 2018-07-06 NOTE — Progress Notes (Signed)
Daratumumab dose updated to reflect patient's weight gain per Dr. Alvy Bimler.   Demetrius Charity, PharmD, [facility] Pharmacist Pharmacy Phone: [phone] 07/06/2018

## 2018-07-20 ENCOUNTER — Other Ambulatory Visit: Payer: Self-pay | Admitting: *Deleted

## 2018-07-20 DIAGNOSIS — C9002 Multiple myeloma in relapse: Secondary | ICD-10-CM

## 2018-07-20 MED ORDER — POMALIDOMIDE 3 MG PO CAPS: 3.0000 mg | ORAL_CAPSULE | Freq: Every day | ORAL | 11 refills | Status: DC

## 2018-07-30 ENCOUNTER — Other Ambulatory Visit: Payer: Self-pay | Admitting: Hematology and Oncology

## 2018-08-03 ENCOUNTER — Other Ambulatory Visit: Payer: Self-pay

## 2018-08-03 ENCOUNTER — Inpatient Hospital Stay: Payer: Medicare Other

## 2018-08-03 ENCOUNTER — Encounter: Payer: Self-pay | Admitting: Hematology and Oncology

## 2018-08-03 ENCOUNTER — Inpatient Hospital Stay (HOSPITAL_BASED_OUTPATIENT_CLINIC_OR_DEPARTMENT_OTHER): Payer: Medicare Other | Admitting: Hematology and Oncology

## 2018-08-03 ENCOUNTER — Inpatient Hospital Stay: Payer: Medicare Other | Attending: Hematology and Oncology

## 2018-08-03 VITALS — BP 128/67 | HR 49 | Temp 97.8°F | Resp 18

## 2018-08-03 VITALS — BP 148/68 | HR 53 | Temp 98.7°F | Resp 18 | Ht 71.0 in | Wt 171.8 lb

## 2018-08-03 DIAGNOSIS — M8580 Other specified disorders of bone density and structure, unspecified site: Secondary | ICD-10-CM | POA: Insufficient documentation

## 2018-08-03 DIAGNOSIS — D631 Anemia in chronic kidney disease: Secondary | ICD-10-CM | POA: Diagnosis not present

## 2018-08-03 DIAGNOSIS — Z7982 Long term (current) use of aspirin: Secondary | ICD-10-CM

## 2018-08-03 DIAGNOSIS — I10 Essential (primary) hypertension: Secondary | ICD-10-CM

## 2018-08-03 DIAGNOSIS — C9002 Multiple myeloma in relapse: Secondary | ICD-10-CM

## 2018-08-03 DIAGNOSIS — Z5112 Encounter for antineoplastic immunotherapy: Secondary | ICD-10-CM | POA: Insufficient documentation

## 2018-08-03 DIAGNOSIS — N183 Chronic kidney disease, stage 3 unspecified: Secondary | ICD-10-CM

## 2018-08-03 DIAGNOSIS — Z9221 Personal history of antineoplastic chemotherapy: Secondary | ICD-10-CM

## 2018-08-03 DIAGNOSIS — Z79899 Other long term (current) drug therapy: Secondary | ICD-10-CM | POA: Insufficient documentation

## 2018-08-03 LAB — CBC WITH DIFFERENTIAL/PLATELET
Abs Immature Granulocytes: 0.06 10*3/uL (ref 0.00–0.07)
Basophils Absolute: 0.2 10*3/uL — ABNORMAL HIGH (ref 0.0–0.1)
Basophils Relative: 2 %
Eosinophils Absolute: 0.3 10*3/uL (ref 0.0–0.5)
Eosinophils Relative: 4 %
HCT: 30.1 % — ABNORMAL LOW (ref 39.0–52.0)
Hemoglobin: 9.9 g/dL — ABNORMAL LOW (ref 13.0–17.0)
Immature Granulocytes: 1 %
Lymphocytes Relative: 55 %
Lymphs Abs: 4.4 10*3/uL — ABNORMAL HIGH (ref 0.7–4.0)
MCH: 31.7 pg (ref 26.0–34.0)
MCHC: 32.9 g/dL (ref 30.0–36.0)
MCV: 96.5 fL (ref 80.0–100.0)
Monocytes Absolute: 0.6 10*3/uL (ref 0.1–1.0)
Monocytes Relative: 8 %
Neutro Abs: 2.4 10*3/uL (ref 1.7–7.7)
Neutrophils Relative %: 30 %
Platelets: 224 10*3/uL (ref 150–400)
RBC: 3.12 MIL/uL — ABNORMAL LOW (ref 4.22–5.81)
RDW: 16.2 % — ABNORMAL HIGH (ref 11.5–15.5)
WBC: 8 10*3/uL (ref 4.0–10.5)
nRBC: 0 % (ref 0.0–0.2)

## 2018-08-03 LAB — COMPREHENSIVE METABOLIC PANEL
ALT: 23 U/L (ref 0–44)
AST: 21 U/L (ref 15–41)
Albumin: 3.5 g/dL (ref 3.5–5.0)
Alkaline Phosphatase: 54 U/L (ref 38–126)
Anion gap: 5 (ref 5–15)
BUN: 16 mg/dL (ref 8–23)
CO2: 22 mmol/L (ref 22–32)
Calcium: 8.8 mg/dL — ABNORMAL LOW (ref 8.9–10.3)
Chloride: 109 mmol/L (ref 98–111)
Creatinine, Ser: 1.89 mg/dL — ABNORMAL HIGH (ref 0.61–1.24)
GFR calc Af Amer: 39 mL/min — ABNORMAL LOW (ref >60)
GFR calc non Af Amer: 34 mL/min — ABNORMAL LOW (ref >60)
Glucose, Bld: 85 mg/dL (ref 70–99)
Potassium: 4.2 mmol/L (ref 3.5–5.1)
Sodium: 136 mmol/L (ref 135–145)
Total Bilirubin: 0.5 mg/dL (ref 0.3–1.2)
Total Protein: 7 g/dL (ref 6.5–8.1)

## 2018-08-03 MED ORDER — SODIUM CHLORIDE 0.9 % IV SOLN
15.4000 mg/kg | Freq: Once | INTRAVENOUS | Status: AC
  Administered 2018-08-03: 1200 mg via INTRAVENOUS
  Filled 2018-08-03: qty 60

## 2018-08-03 MED ORDER — SODIUM CHLORIDE 0.9 % IV SOLN
Freq: Once | INTRAVENOUS | Status: AC
  Administered 2018-08-03: 12:00:00 via INTRAVENOUS
  Filled 2018-08-03: qty 250

## 2018-08-03 MED ORDER — DIPHENHYDRAMINE HCL 25 MG PO CAPS
ORAL_CAPSULE | ORAL | Status: AC
  Filled 2018-08-03: qty 2

## 2018-08-03 MED ORDER — PROCHLORPERAZINE MALEATE 10 MG PO TABS
10.0000 mg | ORAL_TABLET | Freq: Once | ORAL | Status: AC
  Administered 2018-08-03: 10 mg via ORAL

## 2018-08-03 MED ORDER — ACETAMINOPHEN 325 MG PO TABS
ORAL_TABLET | ORAL | Status: AC
  Filled 2018-08-03: qty 2

## 2018-08-03 MED ORDER — METHYLPREDNISOLONE SODIUM SUCC 40 MG IJ SOLR
INTRAMUSCULAR | Status: AC
  Filled 2018-08-03: qty 1

## 2018-08-03 MED ORDER — DIPHENHYDRAMINE HCL 25 MG PO CAPS
50.0000 mg | ORAL_CAPSULE | Freq: Once | ORAL | Status: AC
  Administered 2018-08-03: 50 mg via ORAL

## 2018-08-03 MED ORDER — ACETAMINOPHEN 325 MG PO TABS
650.0000 mg | ORAL_TABLET | Freq: Once | ORAL | Status: AC
  Administered 2018-08-03: 650 mg via ORAL

## 2018-08-03 MED ORDER — PROCHLORPERAZINE MALEATE 10 MG PO TABS
ORAL_TABLET | ORAL | Status: AC
  Filled 2018-08-03: qty 1

## 2018-08-03 MED ORDER — SODIUM CHLORIDE 0.9% FLUSH
10.0000 mL | INTRAVENOUS | Status: DC | PRN
  Administered 2018-08-03: 10 mL
  Filled 2018-08-03: qty 10

## 2018-08-03 MED ORDER — METHYLPREDNISOLONE SODIUM SUCC 40 MG IJ SOLR
40.0000 mg | Freq: Once | INTRAMUSCULAR | Status: AC
  Administered 2018-08-03: 40 mg via INTRAVENOUS

## 2018-08-03 MED ORDER — HEPARIN SOD (PORK) LOCK FLUSH 100 UNIT/ML IV SOLN
500.0000 [IU] | Freq: Once | INTRAVENOUS | Status: AC | PRN
  Administered 2018-08-03: 500 [IU]
  Filled 2018-08-03: qty 5

## 2018-08-03 NOTE — Assessment & Plan Note (Signed)
I recommend increase oral fluid intake as tolerated We will monitor his kidney function carefully. We will proceed regardless of creatinine function with daratumumab

## 2018-08-03 NOTE — Progress Notes (Signed)
[facility] OFFICE PROGRESS NOTE  Patient Care Team: Lucianne Lei, MD as PCP - General (Family Medicine) Heath Lark, MD as Consulting Physician (Hematology and Oncology) Beverely Pace, LCSW as Social Worker (General Practice)  ASSESSMENT & PLAN:  Multiple myeloma in relapse [facility]) His recent myeloma panel was stable.  Repeat myeloma panel from today is pending; the panel from 4 weeks ago showed stable disease control We will continue pomalidomide along with monthly daratumumab He will continue acyclovir for antimicrobial prophylaxis He will continue calcium with vitamin D along with Zometa  Anemia in chronic renal disease He has multifactorial anemia, combination of anemia chronic kidney disease and recent blood loss/iron deficiency anemia His hemoglobin and iron studies are stable.  Continue close observation   Chronic renal insufficiency, stage III (moderate) I recommend increase oral fluid intake as tolerated We will monitor his kidney function carefully. We will proceed regardless of creatinine function with daratumumab   Orders Placed This Encounter  Procedures  . Kappa/lambda light chains    Standing Status:   Standing    Number of Occurrences:   11    Standing Expiration Date:   08/03/2019  . Multiple Myeloma Panel (SPEP&IFE w/QIG)    Standing Status:   Standing    Number of Occurrences:   11    Standing Expiration Date:   08/03/2019    INTERVAL HISTORY: Please see below for problem oriented charting. He returns for further follow-up He denies recent infection No infusion reaction Denies bleeding Overall, he tolerated treatment very well  SUMMARY OF ONCOLOGIC HISTORY: Oncology History  Multiple myeloma in relapse ([place])  12/12/2010 Initial Diagnosis   Multiple myeloma   02/16/2014 Imaging   Skeletal survey showed diffuse osteopenia   03/02/2014 Bone Marrow Biopsy   Accession: [id number] BM biopsy showed only 5 % plasma cells. However, the  biopsy was difficult and the bone was fragmented during the procedure. Cytogenetics and FISH study is normal   03/03/2014 Procedure   he has port placement   03/10/2014 - 05/19/2014 Chemotherapy   he received elotuzumab and revlimid   05/20/2014 - 02/25/2016 Chemotherapy   Treatment is switched to maintenance Revlimid only. Treatment is stopped due to progressive disease   11/15/2014 Adverse Reaction   He has mild worsening anemia. Does of Revlimid reduced to 5 mg 21 days on, 7 days off   03/06/2016 -  Chemotherapy   He received Daratumumab and Velcade. Velcade is stopped on 07/24/16   08/14/2016 Miscellaneous   Pomalyst is added along with Daratumumab   10/17/2016 Surgery   EGD was performed for coffee-ground emesis. Multiple linear superficial ulcers were noted in the esophagus from 25-35 cm from insertion. Severe esophagitis was noted from 30-35 cm from insertion. Multiple biopsies were obtained. A small hiatal hernia was noted. The gastric cavity contained coffee-ground fluid, after suctioning and lavage, no obvious gastric lesions/erosion/ulceration/erythema was noted. Biopsies were taken from the antrum to rule out H. Pylori. Duodenum bulb appeared unremarkable. A small diverticulum was noted in second portion of the duodenum.     12/06/2016 Imaging   Ct scan abdomen No acute findings.  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  Stable small hiatal hernia.  Colonic diverticulosis, without radiographic evidence of diverticulitis.     REVIEW OF SYSTEMS:   Constitutional: Denies fevers, chills or abnormal weight loss Eyes: Denies blurriness of vision Ears, nose, mouth, throat, and face: Denies mucositis or sore throat Respiratory: Denies cough, dyspnea or wheezes Cardiovascular:  Denies palpitation, chest discomfort or lower extremity swelling Gastrointestinal:  Denies nausea, heartburn or change in bowel habits Skin: Denies abnormal skin  rashes Lymphatics: Denies new lymphadenopathy or easy bruising Neurological:Denies numbness, tingling or new weaknesses Behavioral/Psych: Mood is stable, no new changes  All other systems were reviewed with the patient and are negative.  I have reviewed the past medical history, past surgical history, social history and family history with the patient and they are unchanged from previous note.  ALLERGIES:  has No Known Allergies.  MEDICATIONS:  Current Outpatient Medications  Medication Sig Dispense Refill  . acyclovir (ZOVIRAX) 400 MG tablet Take 1 tablet (400 mg total) by mouth daily. 60 tablet 11  . allopurinol (ZYLOPRIM) 100 MG tablet Take 100 mg by mouth 2 (two) times daily.     Marland Kitchen amLODipine (NORVASC) 10 MG tablet Take 10 mg by mouth daily.  1  . aspirin EC 81 MG tablet Take 81 mg by mouth daily.    Marland Kitchen atorvastatin (LIPITOR) 20 MG tablet Take 20 mg by mouth at bedtime.     . brimonidine (ALPHAGAN) 0.2 % ophthalmic solution Place 1 drop into the right eye 3 (three) times daily.     . calcium carbonate (TUMS - DOSED IN MG ELEMENTAL CALCIUM) 500 MG chewable tablet Chew 1 tablet by mouth daily as needed for indigestion or heartburn.    . cholecalciferol (VITAMIN D) 1000 UNITS tablet Take 1,000 Units by mouth daily.    . dorzolamide-timolol (COSOPT) 22.3-6.8 MG/ML ophthalmic solution Place 1 drop into the right eye 2 (two) times daily.     . fluorometholone (FML) 0.1 % ophthalmic suspension Place 1 drop into the right eye daily.     Marland Kitchen latanoprost (XALATAN) 0.005 % ophthalmic solution Place 1 drop into the right eye at bedtime.    . lidocaine-prilocaine (EMLA) cream Apply to affected area once 30 g 3  . loperamide (IMODIUM A-D) 2 MG tablet 1 po after each watery BM.  Maximum 6 per 24hrs. (Patient not taking: Reported on 03/19/2018) 30 tablet PRN  . magnesium oxide (MAG-OX) 400 (241.3 Mg) MG tablet Take 1 tablet (400 mg total) by mouth 2 (two) times daily. 60 tablet 9  . magnesium oxide  (MAG-OX) 400 MG tablet TAKE 1 TABLET BY MOUTH TWICE A DAY 180 tablet 3  . ondansetron (ZOFRAN) 4 MG tablet Take 1 tablet (4 mg total) by mouth every 6 (six) hours. 12 tablet 0  . ondansetron (ZOFRAN) 8 MG tablet Take 1 tablet (8 mg total) by mouth 2 (two) times daily as needed (Nausea or vomiting). 30 tablet 1  . pantoprazole (PROTONIX) 40 MG tablet Take 1 tablet (40 mg total) by mouth daily. (Patient not taking: Reported on 03/19/2018) 180 tablet 3  . pomalidomide (POMALYST) 3 MG capsule Take 1 capsule (3 mg total) by mouth daily. Take with water on days 1-21. Repeat every 28 days. 21 capsule 11   No current facility-administered medications for this visit.    Facility-Administered Medications Ordered in Other Visits  Medication Dose Route Frequency Provider Last Rate Last Dose  . heparin lock flush 100 unit/mL  500 Units Intracatheter Once PRN Alvy Bimler, Sohil Timko, MD      . sodium chloride flush (NS) 0.9 % injection 10 mL  10 mL Intracatheter PRN Alvy Bimler, Zarian Colpitts, MD        PHYSICAL EXAMINATION: ECOG PERFORMANCE STATUS: 1 - Symptomatic but completely ambulatory  Vitals:   08/03/18 1031  BP: (!) 148/68  Pulse: (!) 53  Resp: 18  Temp: 98.7 F (37.1 C)  SpO2: 100%   Filed Weights   08/03/18 1031  Weight: 171 lb 12.8 oz (77.9 kg)    GENERAL:alert, no distress and comfortable SKIN: skin color, texture, turgor are normal, no rashes or significant lesions EYES: normal, Conjunctiva are pink and non-injected, sclera clear OROPHARYNX:no exudate, no erythema and lips, buccal mucosa, and tongue normal  NECK: supple, thyroid normal size, non-tender, without nodularity LYMPH:  no palpable lymphadenopathy in the cervical, axillary or inguinal LUNGS: clear to auscultation and percussion with normal breathing effort HEART: regular rate & rhythm and no murmurs and no lower extremity edema ABDOMEN:abdomen soft, non-tender and normal bowel sounds Musculoskeletal:no cyanosis of digits and no clubbing  NEURO:  alert & oriented x 3 with fluent speech, no focal motor/sensory deficits  LABORATORY DATA:  I have reviewed the data as listed    Component Value Date/Time   NA 136 08/03/2018 0944   NA 140 01/09/2017 0913   K 4.2 08/03/2018 0944   K 3.7 01/09/2017 0913   CL 109 08/03/2018 0944   CL 107 06/29/2012 1131   CO2 22 08/03/2018 0944   CO2 21 (L) 01/09/2017 0913   GLUCOSE 85 08/03/2018 0944   GLUCOSE 80 01/09/2017 0913   GLUCOSE 143 (H) 06/29/2012 1131   BUN 16 08/03/2018 0944   BUN 9.8 01/09/2017 0913   CREATININE 1.89 (H) 08/03/2018 0944   CREATININE 1.85 (H) 07/06/2018 0935   CREATININE 1.5 (H) 01/09/2017 0913   CALCIUM 8.8 (L) 08/03/2018 0944   CALCIUM 7.5 (L) 01/09/2017 0913   PROT 7.0 08/03/2018 0944   PROT 5.7 (L) 01/09/2017 0913   ALBUMIN 3.5 08/03/2018 0944   ALBUMIN 2.9 (L) 01/09/2017 0913   AST 21 08/03/2018 0944   AST 14 (L) 07/06/2018 0935   AST 12 01/09/2017 0913   ALT 23 08/03/2018 0944   ALT 11 07/06/2018 0935   ALT 10 01/09/2017 0913   ALKPHOS 54 08/03/2018 0944   ALKPHOS 55 01/09/2017 0913   BILITOT 0.5 08/03/2018 0944   BILITOT 0.5 07/06/2018 0935   BILITOT 0.44 01/09/2017 0913   GFRNONAA 34 (L) 08/03/2018 0944   GFRNONAA 35 (L) 07/06/2018 0935   GFRAA 39 (L) 08/03/2018 0944   GFRAA 40 (L) 07/06/2018 0935    No results found for: SPEP, UPEP  Lab Results  Component Value Date   WBC 8.0 08/03/2018   NEUTROABS 2.4 08/03/2018   HGB 9.9 (L) 08/03/2018   HCT 30.1 (L) 08/03/2018   MCV 96.5 08/03/2018   PLT 224 08/03/2018      Chemistry      Component Value Date/Time   NA 136 08/03/2018 0944   NA 140 01/09/2017 0913   K 4.2 08/03/2018 0944   K 3.7 01/09/2017 0913   CL 109 08/03/2018 0944   CL 107 06/29/2012 1131   CO2 22 08/03/2018 0944   CO2 21 (L) 01/09/2017 0913   BUN 16 08/03/2018 0944   BUN 9.8 01/09/2017 0913   CREATININE 1.89 (H) 08/03/2018 0944   CREATININE 1.85 (H) 07/06/2018 0935   CREATININE 1.5 (H) 01/09/2017 0913       Component Value Date/Time   CALCIUM 8.8 (L) 08/03/2018 0944   CALCIUM 7.5 (L) 01/09/2017 0913   ALKPHOS 54 08/03/2018 0944   ALKPHOS 55 01/09/2017 0913   AST 21 08/03/2018 0944   AST 14 (L) 07/06/2018 0935   AST 12 01/09/2017 0913   ALT 23 08/03/2018 0944   ALT 11  07/06/2018 0935   ALT 10 01/09/2017 0913   BILITOT 0.5 08/03/2018 0944   BILITOT 0.5 07/06/2018 0935   BILITOT 0.44 01/09/2017 0913      All questions were answered. The patient knows to call the clinic with any problems, questions or concerns. No barriers to learning was detected.  I spent 15 minutes counseling the patient face to face. The total time spent in the appointment was 20 minutes and more than 50% was on counseling and review of test results  Heath Lark, MD 08/03/2018 1:15 PM

## 2018-08-03 NOTE — Assessment & Plan Note (Signed)
He has multifactorial anemia, combination of anemia chronic kidney disease and recent blood loss/iron deficiency anemia His hemoglobin and iron studies are stable.  Continue close observation

## 2018-08-03 NOTE — Assessment & Plan Note (Signed)
His recent myeloma panel was stable.  Repeat myeloma panel from today is pending; the panel from 4 weeks ago showed stable disease control We will continue pomalidomide along with monthly daratumumab He will continue acyclovir for antimicrobial prophylaxis He will continue calcium with vitamin D along with Zometa

## 2018-08-03 NOTE — Patient Instructions (Signed)
[facility] Discharge Instructions for Patients Receiving Chemotherapy  Today you received the following chemotherapy agent: Darzalex  To help prevent nausea and vomiting after your treatment, we encourage you to take your nausea medication as directed.    If you develop nausea and vomiting that is not controlled by your nausea medication, call the clinic.   BELOW ARE SYMPTOMS THAT SHOULD BE REPORTED IMMEDIATELY:  *FEVER GREATER THAN 100.5 F  *CHILLS WITH OR WITHOUT FEVER  NAUSEA AND VOMITING THAT IS NOT CONTROLLED WITH YOUR NAUSEA MEDICATION  *UNUSUAL SHORTNESS OF BREATH  *UNUSUAL BRUISING OR BLEEDING  TENDERNESS IN MOUTH AND THROAT WITH OR WITHOUT PRESENCE OF ULCERS  *URINARY PROBLEMS  *BOWEL PROBLEMS  UNUSUAL RASH Items with * indicate a potential emergency and should be followed up as soon as possible.  Feel free to call the clinic should you have any questions or concerns. The clinic phone number is [phone].  Please show the [facility] at check-in to the Emergency Department and triage nurse.

## 2018-08-04 LAB — KAPPA/LAMBDA LIGHT CHAINS
Kappa free light chain: 279.9 mg/L — ABNORMAL HIGH (ref 3.3–19.4)
Kappa, lambda light chain ratio: 19.04 — ABNORMAL HIGH (ref 0.26–1.65)
Lambda free light chains: 14.7 mg/L (ref 5.7–26.3)

## 2018-08-05 LAB — MULTIPLE MYELOMA PANEL, SERUM
Albumin SerPl Elph-Mcnc: 3.6 g/dL (ref 2.9–4.4)
Albumin/Glob SerPl: 1.3 (ref 0.7–1.7)
Alpha 1: 0.2 g/dL (ref 0.0–0.4)
Alpha2 Glob SerPl Elph-Mcnc: 0.6 g/dL (ref 0.4–1.0)
B-Globulin SerPl Elph-Mcnc: 0.7 g/dL (ref 0.7–1.3)
Gamma Glob SerPl Elph-Mcnc: 1.3 g/dL (ref 0.4–1.8)
Globulin, Total: 2.9 g/dL (ref 2.2–3.9)
IgA: 34 mg/dL — ABNORMAL LOW (ref 61–437)
IgG (Immunoglobin G), Serum: 1581 mg/dL (ref 603–1613)
IgM (Immunoglobulin M), Srm: 9 mg/dL — ABNORMAL LOW (ref 15–143)
M Protein SerPl Elph-Mcnc: 1.1 g/dL — ABNORMAL HIGH
Total Protein ELP: 6.5 g/dL (ref 6.0–8.5)

## 2018-08-19 DIAGNOSIS — Z961 Presence of intraocular lens: Secondary | ICD-10-CM | POA: Diagnosis not present

## 2018-08-19 DIAGNOSIS — H4051X2 Glaucoma secondary to other eye disorders, right eye, moderate stage: Secondary | ICD-10-CM | POA: Diagnosis not present

## 2018-08-19 DIAGNOSIS — Z8669 Personal history of other diseases of the nervous system and sense organs: Secondary | ICD-10-CM | POA: Diagnosis not present

## 2018-08-31 ENCOUNTER — Inpatient Hospital Stay: Payer: Medicare Other

## 2018-08-31 ENCOUNTER — Inpatient Hospital Stay (HOSPITAL_BASED_OUTPATIENT_CLINIC_OR_DEPARTMENT_OTHER): Payer: Medicare Other | Admitting: Hematology and Oncology

## 2018-08-31 ENCOUNTER — Encounter: Payer: Self-pay | Admitting: Hematology and Oncology

## 2018-08-31 ENCOUNTER — Inpatient Hospital Stay: Payer: Medicare Other | Attending: Hematology and Oncology

## 2018-08-31 ENCOUNTER — Other Ambulatory Visit: Payer: Self-pay

## 2018-08-31 VITALS — BP 120/59 | HR 53 | Temp 97.7°F | Resp 17

## 2018-08-31 DIAGNOSIS — D631 Anemia in chronic kidney disease: Secondary | ICD-10-CM | POA: Diagnosis not present

## 2018-08-31 DIAGNOSIS — C9002 Multiple myeloma in relapse: Secondary | ICD-10-CM

## 2018-08-31 DIAGNOSIS — K449 Diaphragmatic hernia without obstruction or gangrene: Secondary | ICD-10-CM | POA: Insufficient documentation

## 2018-08-31 DIAGNOSIS — Z9221 Personal history of antineoplastic chemotherapy: Secondary | ICD-10-CM | POA: Insufficient documentation

## 2018-08-31 DIAGNOSIS — N183 Chronic kidney disease, stage 3 unspecified: Secondary | ICD-10-CM

## 2018-08-31 DIAGNOSIS — M8580 Other specified disorders of bone density and structure, unspecified site: Secondary | ICD-10-CM | POA: Insufficient documentation

## 2018-08-31 DIAGNOSIS — Z79899 Other long term (current) drug therapy: Secondary | ICD-10-CM | POA: Insufficient documentation

## 2018-08-31 LAB — CBC WITH DIFFERENTIAL/PLATELET
Abs Immature Granulocytes: 0.06 10*3/uL (ref 0.00–0.07)
Basophils Absolute: 0.1 10*3/uL (ref 0.0–0.1)
Basophils Relative: 2 %
Eosinophils Absolute: 0.2 10*3/uL (ref 0.0–0.5)
Eosinophils Relative: 3 %
HCT: 27.5 % — ABNORMAL LOW (ref 39.0–52.0)
Hemoglobin: 9.2 g/dL — ABNORMAL LOW (ref 13.0–17.0)
Immature Granulocytes: 1 %
Lymphocytes Relative: 41 %
Lymphs Abs: 3 10*3/uL (ref 0.7–4.0)
MCH: 32.7 pg (ref 26.0–34.0)
MCHC: 33.5 g/dL (ref 30.0–36.0)
MCV: 97.9 fL (ref 80.0–100.0)
Monocytes Absolute: 0.6 10*3/uL (ref 0.1–1.0)
Monocytes Relative: 8 %
Neutro Abs: 3.3 10*3/uL (ref 1.7–7.7)
Neutrophils Relative %: 45 %
Platelets: 202 10*3/uL (ref 150–400)
RBC: 2.81 MIL/uL — ABNORMAL LOW (ref 4.22–5.81)
RDW: 17 % — ABNORMAL HIGH (ref 11.5–15.5)
WBC: 7.2 10*3/uL (ref 4.0–10.5)
nRBC: 0 % (ref 0.0–0.2)

## 2018-08-31 LAB — COMPREHENSIVE METABOLIC PANEL
ALT: 11 U/L (ref 0–44)
AST: 11 U/L — ABNORMAL LOW (ref 15–41)
Albumin: 3.3 g/dL — ABNORMAL LOW (ref 3.5–5.0)
Alkaline Phosphatase: 53 U/L (ref 38–126)
Anion gap: 6 (ref 5–15)
BUN: 13 mg/dL (ref 8–23)
CO2: 22 mmol/L (ref 22–32)
Calcium: 8.4 mg/dL — ABNORMAL LOW (ref 8.9–10.3)
Chloride: 110 mmol/L (ref 98–111)
Creatinine, Ser: 1.82 mg/dL — ABNORMAL HIGH (ref 0.61–1.24)
GFR calc Af Amer: 41 mL/min — ABNORMAL LOW (ref >60)
GFR calc non Af Amer: 36 mL/min — ABNORMAL LOW (ref >60)
Glucose, Bld: 98 mg/dL (ref 70–99)
Potassium: 3.8 mmol/L (ref 3.5–5.1)
Sodium: 138 mmol/L (ref 135–145)
Total Bilirubin: 0.6 mg/dL (ref 0.3–1.2)
Total Protein: 6.5 g/dL (ref 6.5–8.1)

## 2018-08-31 MED ORDER — HEPARIN SOD (PORK) LOCK FLUSH 100 UNIT/ML IV SOLN
500.0000 [IU] | Freq: Once | INTRAVENOUS | Status: AC | PRN
  Administered 2018-08-31: 15:00:00 500 [IU]
  Filled 2018-08-31: qty 5

## 2018-08-31 MED ORDER — SODIUM CHLORIDE 0.9 % IV SOLN
15.4000 mg/kg | Freq: Once | INTRAVENOUS | Status: AC
  Administered 2018-08-31: 1200 mg via INTRAVENOUS
  Filled 2018-08-31: qty 60

## 2018-08-31 MED ORDER — PROCHLORPERAZINE MALEATE 10 MG PO TABS
10.0000 mg | ORAL_TABLET | Freq: Once | ORAL | Status: AC
  Administered 2018-08-31: 12:00:00 10 mg via ORAL

## 2018-08-31 MED ORDER — ACETAMINOPHEN 325 MG PO TABS
650.0000 mg | ORAL_TABLET | Freq: Once | ORAL | Status: AC
  Administered 2018-08-31: 12:00:00 650 mg via ORAL

## 2018-08-31 MED ORDER — ACETAMINOPHEN 325 MG PO TABS
ORAL_TABLET | ORAL | Status: AC
  Filled 2018-08-31: qty 2

## 2018-08-31 MED ORDER — DIPHENHYDRAMINE HCL 25 MG PO CAPS
50.0000 mg | ORAL_CAPSULE | Freq: Once | ORAL | Status: AC
  Administered 2018-08-31: 12:00:00 50 mg via ORAL

## 2018-08-31 MED ORDER — METHYLPREDNISOLONE SODIUM SUCC 40 MG IJ SOLR
INTRAMUSCULAR | Status: AC
  Filled 2018-08-31: qty 1

## 2018-08-31 MED ORDER — PROCHLORPERAZINE MALEATE 10 MG PO TABS
ORAL_TABLET | ORAL | Status: AC
  Filled 2018-08-31: qty 1

## 2018-08-31 MED ORDER — DIPHENHYDRAMINE HCL 25 MG PO CAPS
ORAL_CAPSULE | ORAL | Status: AC
  Filled 2018-08-31: qty 2

## 2018-08-31 MED ORDER — SODIUM CHLORIDE 0.9 % IV SOLN
Freq: Once | INTRAVENOUS | Status: AC
  Administered 2018-08-31: 12:00:00 via INTRAVENOUS
  Filled 2018-08-31: qty 250

## 2018-08-31 MED ORDER — METHYLPREDNISOLONE SODIUM SUCC 40 MG IJ SOLR
40.0000 mg | Freq: Once | INTRAMUSCULAR | Status: AC
  Administered 2018-08-31: 12:00:00 40 mg via INTRAVENOUS

## 2018-08-31 MED ORDER — SODIUM CHLORIDE 0.9% FLUSH
10.0000 mL | INTRAVENOUS | Status: DC | PRN
  Administered 2018-08-31: 10 mL
  Filled 2018-08-31: qty 10

## 2018-08-31 NOTE — Assessment & Plan Note (Signed)
His recent myeloma panel was stable.  Repeat myeloma panel from today is pending; the panel from 4 weeks ago showed stable disease control We will continue pomalidomide along with monthly daratumumab He will continue acyclovir for antimicrobial prophylaxis He will continue calcium with vitamin D along with Zometa 

## 2018-08-31 NOTE — Progress Notes (Signed)
[place] OFFICE PROGRESS NOTE  Patient Care Team: Lucianne Lei, MD as PCP - General (Family Medicine) Heath Lark, MD as Consulting Physician (Hematology and Oncology) Beverely Pace, LCSW as Social Worker (General Practice)  ASSESSMENT & PLAN:  Multiple myeloma in relapse [facility]) His recent myeloma panel was stable.  Repeat myeloma panel from today is pending; the panel from 4 weeks ago showed stable disease control We will continue pomalidomide along with monthly daratumumab He will continue acyclovir for antimicrobial prophylaxis He will continue calcium with vitamin D along with Zometa  Chronic renal insufficiency, stage III (moderate) I recommend increase oral fluid intake as tolerated We will monitor his kidney function carefully. We will proceed regardless of creatinine function with daratumumab   Anemia in chronic renal disease He has multifactorial anemia, combination of anemia chronic kidney disease and recent blood loss/iron deficiency anemia Continue close observation    No orders of the defined types were placed in this encounter.   INTERVAL HISTORY: Please see below for problem oriented charting. He returns for further follow-up No new bone pain No recent infection, fever or chills Denies infusion reaction. The patient denies any recent signs or symptoms of bleeding such as spontaneous epistaxis, hematuria or hematochezia.  SUMMARY OF ONCOLOGIC HISTORY: Oncology History  Multiple myeloma in relapse ([place])  12/12/2010 Initial Diagnosis   Multiple myeloma   02/16/2014 Imaging   Skeletal survey showed diffuse osteopenia   03/02/2014 Bone Marrow Biopsy   Accession: [id number] biopsy showed only 5 % plasma cells. However, the biopsy was difficult and the bone was fragmented during the procedure. Cytogenetics and FISH study is normal   03/03/2014 Procedure   he has port placement   03/10/2014 - 05/19/2014 Chemotherapy   he received  elotuzumab and revlimid   05/20/2014 - 02/25/2016 Chemotherapy   Treatment is switched to maintenance Revlimid only. Treatment is stopped due to progressive disease   11/15/2014 Adverse Reaction   He has mild worsening anemia. Does of Revlimid reduced to 5 mg 21 days on, 7 days off   03/06/2016 -  Chemotherapy   He received Daratumumab and Velcade. Velcade is stopped on 07/24/16   08/14/2016 Miscellaneous   Pomalyst is added along with Daratumumab   10/17/2016 Surgery   EGD was performed for coffee-ground emesis. Multiple linear superficial ulcers were noted in the esophagus from 25-35 cm from insertion. Severe esophagitis was noted from 30-35 cm from insertion. Multiple biopsies were obtained. A small hiatal hernia was noted. The gastric cavity contained coffee-ground fluid, after suctioning and lavage, no obvious gastric lesions/erosion/ulceration/erythema was noted. Biopsies were taken from the antrum to rule out H. Pylori. Duodenum bulb appeared unremarkable. A small diverticulum was noted in second portion of the duodenum.     12/06/2016 Imaging   Ct scan abdomen No acute findings.  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  Stable small hiatal hernia.  Colonic diverticulosis, without radiographic evidence of diverticulitis.     REVIEW OF SYSTEMS:   Constitutional: Denies fevers, chills or abnormal weight loss Eyes: Denies blurriness of vision Ears, nose, mouth, throat, and face: Denies mucositis or sore throat Respiratory: Denies cough, dyspnea or wheezes Cardiovascular: Denies palpitation, chest discomfort or lower extremity swelling Gastrointestinal:  Denies nausea, heartburn or change in bowel habits Skin: Denies abnormal skin rashes Lymphatics: Denies new lymphadenopathy or easy bruising Neurological:Denies numbness, tingling or new weaknesses Behavioral/Psych: Mood is stable, no new changes  All other systems were reviewed with  the  patient and are negative.  I have reviewed the past medical history, past surgical history, social history and family history with the patient and they are unchanged from previous note.  ALLERGIES:  has No Known Allergies.  MEDICATIONS:  Current Outpatient Medications  Medication Sig Dispense Refill  . acyclovir (ZOVIRAX) 400 MG tablet Take 1 tablet (400 mg total) by mouth daily. 60 tablet 11  . allopurinol (ZYLOPRIM) 100 MG tablet Take 100 mg by mouth 2 (two) times daily.     Marland Kitchen amLODipine (NORVASC) 10 MG tablet Take 10 mg by mouth daily.  1  . aspirin EC 81 MG tablet Take 81 mg by mouth daily.    Marland Kitchen atorvastatin (LIPITOR) 20 MG tablet Take 20 mg by mouth at bedtime.     . brimonidine (ALPHAGAN) 0.2 % ophthalmic solution Place 1 drop into the right eye 3 (three) times daily.     . calcium carbonate (TUMS - DOSED IN MG ELEMENTAL CALCIUM) 500 MG chewable tablet Chew 1 tablet by mouth daily as needed for indigestion or heartburn.    . cholecalciferol (VITAMIN D) 1000 UNITS tablet Take 1,000 Units by mouth daily.    . dorzolamide-timolol (COSOPT) 22.3-6.8 MG/ML ophthalmic solution Place 1 drop into the right eye 2 (two) times daily.     . fluorometholone (FML) 0.1 % ophthalmic suspension Place 1 drop into the right eye daily.     Marland Kitchen latanoprost (XALATAN) 0.005 % ophthalmic solution Place 1 drop into the right eye at bedtime.    . lidocaine-prilocaine (EMLA) cream Apply to affected area once 30 g 3  . loperamide (IMODIUM A-D) 2 MG tablet 1 po after each watery BM.  Maximum 6 per 24hrs. (Patient not taking: Reported on 03/19/2018) 30 tablet PRN  . magnesium oxide (MAG-OX) 400 (241.3 Mg) MG tablet Take 1 tablet (400 mg total) by mouth 2 (two) times daily. 60 tablet 9  . magnesium oxide (MAG-OX) 400 MG tablet TAKE 1 TABLET BY MOUTH TWICE A DAY 180 tablet 3  . ondansetron (ZOFRAN) 4 MG tablet Take 1 tablet (4 mg total) by mouth every 6 (six) hours. 12 tablet 0  . ondansetron (ZOFRAN) 8 MG tablet  Take 1 tablet (8 mg total) by mouth 2 (two) times daily as needed (Nausea or vomiting). 30 tablet 1  . pantoprazole (PROTONIX) 40 MG tablet Take 1 tablet (40 mg total) by mouth daily. (Patient not taking: Reported on 03/19/2018) 180 tablet 3  . pomalidomide (POMALYST) 3 MG capsule Take 1 capsule (3 mg total) by mouth daily. Take with water on days 1-21. Repeat every 28 days. 21 capsule 11   No current facility-administered medications for this visit.    Facility-Administered Medications Ordered in Other Visits  Medication Dose Route Frequency Provider Last Rate Last Dose  . sodium chloride flush (NS) 0.9 % injection 10 mL  10 mL Intracatheter PRN Alvy Bimler, Kajal Scalici, MD   10 mL at 08/31/18 1501    PHYSICAL EXAMINATION: ECOG PERFORMANCE STATUS: 1 - Symptomatic but completely ambulatory  Vitals:   08/31/18 1112  BP: (!) 140/51  Pulse: (!) 59  Resp: 17  Temp: 98.3 F (36.8 C)  SpO2: 100%   Filed Weights   08/31/18 1112  Weight: 172 lb (78 kg)    GENERAL:alert, no distress and comfortable SKIN: skin color, texture, turgor are normal, no rashes or significant lesions EYES: normal, Conjunctiva are pink and non-injected, sclera clear OROPHARYNX:no exudate, no erythema and lips, buccal mucosa, and tongue normal  NECK: supple,  thyroid normal size, non-tender, without nodularity LYMPH:  no palpable lymphadenopathy in the cervical, axillary or inguinal LUNGS: clear to auscultation and percussion with normal breathing effort HEART: regular rate & rhythm and no murmurs and no lower extremity edema ABDOMEN:abdomen soft, non-tender and normal bowel sounds Musculoskeletal:no cyanosis of digits and no clubbing  NEURO: alert & oriented x 3 with fluent speech, no focal motor/sensory deficits  LABORATORY DATA:  I have reviewed the data as listed    Component Value Date/Time   NA 138 08/31/2018 1040   NA 140 01/09/2017 0913   K 3.8 08/31/2018 1040   K 3.7 01/09/2017 0913   CL 110 08/31/2018 1040    CL 107 06/29/2012 1131   CO2 22 08/31/2018 1040   CO2 21 (L) 01/09/2017 0913   GLUCOSE 98 08/31/2018 1040   GLUCOSE 80 01/09/2017 0913   GLUCOSE 143 (H) 06/29/2012 1131   BUN 13 08/31/2018 1040   BUN 9.8 01/09/2017 0913   CREATININE 1.82 (H) 08/31/2018 1040   CREATININE 1.85 (H) 07/06/2018 0935   CREATININE 1.5 (H) 01/09/2017 0913   CALCIUM 8.4 (L) 08/31/2018 1040   CALCIUM 7.5 (L) 01/09/2017 0913   PROT 6.5 08/31/2018 1040   PROT 5.7 (L) 01/09/2017 0913   ALBUMIN 3.3 (L) 08/31/2018 1040   ALBUMIN 2.9 (L) 01/09/2017 0913   AST 11 (L) 08/31/2018 1040   AST 14 (L) 07/06/2018 0935   AST 12 01/09/2017 0913   ALT 11 08/31/2018 1040   ALT 11 07/06/2018 0935   ALT 10 01/09/2017 0913   ALKPHOS 53 08/31/2018 1040   ALKPHOS 55 01/09/2017 0913   BILITOT 0.6 08/31/2018 1040   BILITOT 0.5 07/06/2018 0935   BILITOT 0.44 01/09/2017 0913   GFRNONAA 36 (L) 08/31/2018 1040   GFRNONAA 35 (L) 07/06/2018 0935   GFRAA 41 (L) 08/31/2018 1040   GFRAA 40 (L) 07/06/2018 0935    No results found for: SPEP, UPEP  Lab Results  Component Value Date   WBC 7.2 08/31/2018   NEUTROABS 3.3 08/31/2018   HGB 9.2 (L) 08/31/2018   HCT 27.5 (L) 08/31/2018   MCV 97.9 08/31/2018   PLT 202 08/31/2018      Chemistry      Component Value Date/Time   NA 138 08/31/2018 1040   NA 140 01/09/2017 0913   K 3.8 08/31/2018 1040   K 3.7 01/09/2017 0913   CL 110 08/31/2018 1040   CL 107 06/29/2012 1131   CO2 22 08/31/2018 1040   CO2 21 (L) 01/09/2017 0913   BUN 13 08/31/2018 1040   BUN 9.8 01/09/2017 0913   CREATININE 1.82 (H) 08/31/2018 1040   CREATININE 1.85 (H) 07/06/2018 0935   CREATININE 1.5 (H) 01/09/2017 0913      Component Value Date/Time   CALCIUM 8.4 (L) 08/31/2018 1040   CALCIUM 7.5 (L) 01/09/2017 0913   ALKPHOS 53 08/31/2018 1040   ALKPHOS 55 01/09/2017 0913   AST 11 (L) 08/31/2018 1040   AST 14 (L) 07/06/2018 0935   AST 12 01/09/2017 0913   ALT 11 08/31/2018 1040   ALT 11 07/06/2018  0935   ALT 10 01/09/2017 0913   BILITOT 0.6 08/31/2018 1040   BILITOT 0.5 07/06/2018 0935   BILITOT 0.44 01/09/2017 0913      All questions were answered. The patient knows to call the clinic with any problems, questions or concerns. No barriers to learning was detected.  I spent 15 minutes counseling the patient face to face. The total  time spent in the appointment was 20 minutes and more than 50% was on counseling and review of test results  Heath Lark, MD 08/31/2018 3:42 PM

## 2018-08-31 NOTE — Assessment & Plan Note (Signed)
He has multifactorial anemia, combination of anemia chronic kidney disease and recent blood loss/iron deficiency anemia Continue close observation

## 2018-08-31 NOTE — Patient Instructions (Signed)
[facility] Discharge Instructions for Patients Receiving Chemotherapy  Today you received the following chemotherapy agent: Darzalex  To help prevent nausea and vomiting after your treatment, we encourage you to take your nausea medication as directed.    If you develop nausea and vomiting that is not controlled by your nausea medication, call the clinic.   BELOW ARE SYMPTOMS THAT SHOULD BE REPORTED IMMEDIATELY:  *FEVER GREATER THAN 100.5 F  *CHILLS WITH OR WITHOUT FEVER  NAUSEA AND VOMITING THAT IS NOT CONTROLLED WITH YOUR NAUSEA MEDICATION  *UNUSUAL SHORTNESS OF BREATH  *UNUSUAL BRUISING OR BLEEDING  TENDERNESS IN MOUTH AND THROAT WITH OR WITHOUT PRESENCE OF ULCERS  *URINARY PROBLEMS  *BOWEL PROBLEMS  UNUSUAL RASH Items with * indicate a potential emergency and should be followed up as soon as possible.  Feel free to call the clinic should you have any questions or concerns. The clinic phone number is [phone].  Please show the CHEMO ALERT CARD at check-in to the Emergency Department and triage nurse.  

## 2018-08-31 NOTE — Assessment & Plan Note (Signed)
I recommend increase oral fluid intake as tolerated We will monitor his kidney function carefully. We will proceed regardless of creatinine function with daratumumab 

## 2018-09-01 ENCOUNTER — Telehealth: Payer: Self-pay | Admitting: Hematology and Oncology

## 2018-09-01 LAB — KAPPA/LAMBDA LIGHT CHAINS
Kappa free light chain: 240.9 mg/L — ABNORMAL HIGH (ref 3.3–19.4)
Kappa, lambda light chain ratio: 18.67 — ABNORMAL HIGH (ref 0.26–1.65)
Lambda free light chains: 12.9 mg/L (ref 5.7–26.3)

## 2018-09-01 LAB — MULTIPLE MYELOMA PANEL, SERUM
Albumin SerPl Elph-Mcnc: 3.3 g/dL (ref 2.9–4.4)
Albumin/Glob SerPl: 1.3 (ref 0.7–1.7)
Alpha 1: 0.2 g/dL (ref 0.0–0.4)
Alpha2 Glob SerPl Elph-Mcnc: 0.6 g/dL (ref 0.4–1.0)
B-Globulin SerPl Elph-Mcnc: 0.7 g/dL (ref 0.7–1.3)
Gamma Glob SerPl Elph-Mcnc: 1.2 g/dL (ref 0.4–1.8)
Globulin, Total: 2.7 g/dL (ref 2.2–3.9)
IgA: 29 mg/dL — ABNORMAL LOW (ref 61–437)
IgG (Immunoglobin G), Serum: 1357 mg/dL (ref 603–1613)
IgM (Immunoglobulin M), Srm: 6 mg/dL — ABNORMAL LOW (ref 15–143)
M Protein SerPl Elph-Mcnc: 0.9 g/dL — ABNORMAL HIGH
Total Protein ELP: 6 g/dL (ref 6.0–8.5)

## 2018-09-01 NOTE — Telephone Encounter (Signed)
I talk with patient regarding schedule  

## 2018-09-03 ENCOUNTER — Other Ambulatory Visit: Payer: Self-pay

## 2018-09-03 DIAGNOSIS — C9002 Multiple myeloma in relapse: Secondary | ICD-10-CM

## 2018-09-03 MED ORDER — POMALIDOMIDE 3 MG PO CAPS: 3.0000 mg | ORAL_CAPSULE | Freq: Every day | ORAL | 11 refills | Status: DC

## 2018-09-28 ENCOUNTER — Telehealth: Payer: Self-pay | Admitting: Hematology and Oncology

## 2018-09-28 ENCOUNTER — Inpatient Hospital Stay: Payer: Medicare Other

## 2018-09-28 ENCOUNTER — Other Ambulatory Visit: Payer: Self-pay

## 2018-09-28 ENCOUNTER — Inpatient Hospital Stay (HOSPITAL_BASED_OUTPATIENT_CLINIC_OR_DEPARTMENT_OTHER): Payer: Medicare Other | Admitting: Hematology and Oncology

## 2018-09-28 ENCOUNTER — Inpatient Hospital Stay: Payer: Medicare Other | Attending: Hematology and Oncology

## 2018-09-28 ENCOUNTER — Encounter: Payer: Self-pay | Admitting: Hematology and Oncology

## 2018-09-28 VITALS — BP 121/65 | HR 50 | Temp 97.7°F | Resp 18

## 2018-09-28 DIAGNOSIS — K5730 Diverticulosis of large intestine without perforation or abscess without bleeding: Secondary | ICD-10-CM | POA: Diagnosis not present

## 2018-09-28 DIAGNOSIS — C9002 Multiple myeloma in relapse: Secondary | ICD-10-CM

## 2018-09-28 DIAGNOSIS — M8580 Other specified disorders of bone density and structure, unspecified site: Secondary | ICD-10-CM | POA: Insufficient documentation

## 2018-09-28 DIAGNOSIS — N183 Chronic kidney disease, stage 3 unspecified: Secondary | ICD-10-CM

## 2018-09-28 DIAGNOSIS — Z5112 Encounter for antineoplastic immunotherapy: Secondary | ICD-10-CM | POA: Insufficient documentation

## 2018-09-28 DIAGNOSIS — Z23 Encounter for immunization: Secondary | ICD-10-CM | POA: Insufficient documentation

## 2018-09-28 DIAGNOSIS — K529 Noninfective gastroenteritis and colitis, unspecified: Secondary | ICD-10-CM | POA: Insufficient documentation

## 2018-09-28 DIAGNOSIS — D61818 Other pancytopenia: Secondary | ICD-10-CM | POA: Diagnosis not present

## 2018-09-28 DIAGNOSIS — Z9221 Personal history of antineoplastic chemotherapy: Secondary | ICD-10-CM | POA: Insufficient documentation

## 2018-09-28 DIAGNOSIS — Z79899 Other long term (current) drug therapy: Secondary | ICD-10-CM | POA: Insufficient documentation

## 2018-09-28 DIAGNOSIS — K449 Diaphragmatic hernia without obstruction or gangrene: Secondary | ICD-10-CM | POA: Insufficient documentation

## 2018-09-28 DIAGNOSIS — Z95828 Presence of other vascular implants and grafts: Secondary | ICD-10-CM

## 2018-09-28 LAB — CBC WITH DIFFERENTIAL/PLATELET
Abs Immature Granulocytes: 0.04 10*3/uL (ref 0.00–0.07)
Basophils Absolute: 0.1 10*3/uL (ref 0.0–0.1)
Basophils Relative: 2 %
Eosinophils Absolute: 0.3 10*3/uL (ref 0.0–0.5)
Eosinophils Relative: 3 %
HCT: 25.5 % — ABNORMAL LOW (ref 39.0–52.0)
Hemoglobin: 8.7 g/dL — ABNORMAL LOW (ref 13.0–17.0)
Immature Granulocytes: 1 %
Lymphocytes Relative: 39 %
Lymphs Abs: 3.4 10*3/uL (ref 0.7–4.0)
MCH: 32.1 pg (ref 26.0–34.0)
MCHC: 34.1 g/dL (ref 30.0–36.0)
MCV: 94.1 fL (ref 80.0–100.0)
Monocytes Absolute: 0.9 10*3/uL (ref 0.1–1.0)
Monocytes Relative: 10 %
Neutro Abs: 4.1 10*3/uL (ref 1.7–7.7)
Neutrophils Relative %: 45 %
Platelets: 321 10*3/uL (ref 150–400)
RBC: 2.71 MIL/uL — ABNORMAL LOW (ref 4.22–5.81)
RDW: 17.2 % — ABNORMAL HIGH (ref 11.5–15.5)
WBC: 8.8 10*3/uL (ref 4.0–10.5)
nRBC: 0 % (ref 0.0–0.2)

## 2018-09-28 LAB — COMPREHENSIVE METABOLIC PANEL
ALT: 12 U/L (ref 0–44)
AST: 12 U/L — ABNORMAL LOW (ref 15–41)
Albumin: 3.3 g/dL — ABNORMAL LOW (ref 3.5–5.0)
Alkaline Phosphatase: 58 U/L (ref 38–126)
Anion gap: 8 (ref 5–15)
BUN: 11 mg/dL (ref 8–23)
CO2: 24 mmol/L (ref 22–32)
Calcium: 8.8 mg/dL — ABNORMAL LOW (ref 8.9–10.3)
Chloride: 109 mmol/L (ref 98–111)
Creatinine, Ser: 1.61 mg/dL — ABNORMAL HIGH (ref 0.61–1.24)
GFR calc Af Amer: 48 mL/min — ABNORMAL LOW (ref >60)
GFR calc non Af Amer: 41 mL/min — ABNORMAL LOW (ref >60)
Glucose, Bld: 86 mg/dL (ref 70–99)
Potassium: 3.6 mmol/L (ref 3.5–5.1)
Sodium: 141 mmol/L (ref 135–145)
Total Bilirubin: 0.6 mg/dL (ref 0.3–1.2)
Total Protein: 6.3 g/dL — ABNORMAL LOW (ref 6.5–8.1)

## 2018-09-28 MED ORDER — INFLUENZA VAC A&B SA ADJ QUAD 0.5 ML IM PRSY
PREFILLED_SYRINGE | INTRAMUSCULAR | Status: AC
  Filled 2018-09-28: qty 0.5

## 2018-09-28 MED ORDER — INFLUENZA VAC A&B SA ADJ QUAD 0.5 ML IM PRSY
0.5000 mL | PREFILLED_SYRINGE | Freq: Once | INTRAMUSCULAR | Status: AC
  Administered 2018-09-28: 0.5 mL via INTRAMUSCULAR

## 2018-09-28 MED ORDER — DIPHENHYDRAMINE HCL 25 MG PO CAPS
ORAL_CAPSULE | ORAL | Status: AC
  Filled 2018-09-28: qty 2

## 2018-09-28 MED ORDER — METHYLPREDNISOLONE SODIUM SUCC 40 MG IJ SOLR
40.0000 mg | Freq: Once | INTRAMUSCULAR | Status: AC
  Administered 2018-09-28: 40 mg via INTRAVENOUS

## 2018-09-28 MED ORDER — SODIUM CHLORIDE 0.9 % IV SOLN
Freq: Once | INTRAVENOUS | Status: AC
  Administered 2018-09-28: 11:00:00 via INTRAVENOUS
  Filled 2018-09-28: qty 250

## 2018-09-28 MED ORDER — PROCHLORPERAZINE MALEATE 10 MG PO TABS
ORAL_TABLET | ORAL | Status: AC
  Filled 2018-09-28: qty 1

## 2018-09-28 MED ORDER — METHYLPREDNISOLONE SODIUM SUCC 40 MG IJ SOLR
INTRAMUSCULAR | Status: AC
  Filled 2018-09-28: qty 1

## 2018-09-28 MED ORDER — ACETAMINOPHEN 325 MG PO TABS
ORAL_TABLET | ORAL | Status: AC
  Filled 2018-09-28: qty 2

## 2018-09-28 MED ORDER — SODIUM CHLORIDE 0.9 % IV SOLN
15.4000 mg/kg | Freq: Once | INTRAVENOUS | Status: AC
  Administered 2018-09-28: 1200 mg via INTRAVENOUS
  Filled 2018-09-28: qty 60

## 2018-09-28 MED ORDER — HEPARIN SOD (PORK) LOCK FLUSH 100 UNIT/ML IV SOLN
500.0000 [IU] | Freq: Once | INTRAVENOUS | Status: AC | PRN
  Administered 2018-09-28: 500 [IU]
  Filled 2018-09-28: qty 5

## 2018-09-28 MED ORDER — DIPHENHYDRAMINE HCL 25 MG PO CAPS
50.0000 mg | ORAL_CAPSULE | Freq: Once | ORAL | Status: AC
  Administered 2018-09-28: 50 mg via ORAL

## 2018-09-28 MED ORDER — PROCHLORPERAZINE MALEATE 10 MG PO TABS
10.0000 mg | ORAL_TABLET | Freq: Once | ORAL | Status: AC
  Administered 2018-09-28: 10 mg via ORAL

## 2018-09-28 MED ORDER — SODIUM CHLORIDE 0.9% FLUSH
10.0000 mL | INTRAVENOUS | Status: DC | PRN
  Administered 2018-09-28: 10 mL
  Filled 2018-09-28: qty 10

## 2018-09-28 MED ORDER — ZOLEDRONIC ACID 4 MG/5ML IV CONC
3.0000 mg | Freq: Once | INTRAVENOUS | Status: AC
  Administered 2018-09-28: 3 mg via INTRAVENOUS
  Filled 2018-09-28: qty 3.75

## 2018-09-28 MED ORDER — SODIUM CHLORIDE 0.9% FLUSH
10.0000 mL | Freq: Once | INTRAVENOUS | Status: AC
  Administered 2018-09-28: 10 mL
  Filled 2018-09-28: qty 10

## 2018-09-28 MED ORDER — ACETAMINOPHEN 325 MG PO TABS
650.0000 mg | ORAL_TABLET | Freq: Once | ORAL | Status: AC
  Administered 2018-09-28: 650 mg via ORAL

## 2018-09-28 NOTE — Progress Notes (Signed)
[place] OFFICE PROGRESS NOTE  Patient Care Team: Lucianne Lei, MD as PCP - General (Family Medicine) Heath Lark, MD as Consulting Physician (Hematology and Oncology) Beverely Pace, LCSW as Social Worker (General Practice)  ASSESSMENT & PLAN:  Multiple myeloma in relapse [facility]) His recent myeloma panel was stable.  Repeat myeloma panel from today is pending; the panel from 4 weeks ago showed stable disease control We will continue pomalidomide along with monthly daratumumab He will continue acyclovir for antimicrobial prophylaxis He will continue calcium with vitamin D along with Zometa  Chronic renal insufficiency, stage III (moderate) I recommend increase oral fluid intake as tolerated We will monitor his kidney function carefully. We will proceed regardless of creatinine function with daratumumab   Pancytopenia, acquired ([place]) He has multifactorial anemia and mild pancytopenia due to treatment He is not symptomatic Observe only  Chronic diarrhea He has mild chronic intermittent diarrhea, could be due to medication I recommend antidiarrheal medicine as needed   No orders of the defined types were placed in this encounter.   INTERVAL HISTORY: Please see below for problem oriented charting. He returns for further follow-up He feels well No recent infection, fever or chills Denies infusion reaction He complained of recent diarrhea and saw his primary care doctor He was prescribed some medicine for diarrhea and that seems to help No recent bleeding No recent jaw problems  SUMMARY OF ONCOLOGIC HISTORY: Oncology History  Multiple myeloma in relapse ([place])  12/12/2010 Initial Diagnosis   Multiple myeloma   02/16/2014 Imaging   Skeletal survey showed diffuse osteopenia   03/02/2014 Bone Marrow Biopsy   Accession: [id number] biopsy showed only 5 % plasma cells. However, the biopsy was difficult and the bone was fragmented during the procedure.  Cytogenetics and FISH study is normal   03/03/2014 Procedure   he has port placement   03/10/2014 - 05/19/2014 Chemotherapy   he received elotuzumab and revlimid   05/20/2014 - 02/25/2016 Chemotherapy   Treatment is switched to maintenance Revlimid only. Treatment is stopped due to progressive disease   11/15/2014 Adverse Reaction   He has mild worsening anemia. Does of Revlimid reduced to 5 mg 21 days on, 7 days off   03/06/2016 -  Chemotherapy   He received Daratumumab and Velcade. Velcade is stopped on 07/24/16   08/14/2016 Miscellaneous   Pomalyst is added along with Daratumumab   10/17/2016 Surgery   EGD was performed for coffee-ground emesis. Multiple linear superficial ulcers were noted in the esophagus from 25-35 cm from insertion. Severe esophagitis was noted from 30-35 cm from insertion. Multiple biopsies were obtained. A small hiatal hernia was noted. The gastric cavity contained coffee-ground fluid, after suctioning and lavage, no obvious gastric lesions/erosion/ulceration/erythema was noted. Biopsies were taken from the antrum to rule out H. Pylori. Duodenum bulb appeared unremarkable. A small diverticulum was noted in second portion of the duodenum.     12/06/2016 Imaging   Ct scan abdomen No acute findings.  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  Stable small hiatal hernia.  Colonic diverticulosis, without radiographic evidence of diverticulitis.     REVIEW OF SYSTEMS:   Constitutional: Denies fevers, chills or abnormal weight loss Eyes: Denies blurriness of vision Ears, nose, mouth, throat, and face: Denies mucositis or sore throat Respiratory: Denies cough, dyspnea or wheezes Cardiovascular: Denies palpitation, chest discomfort or lower extremity swelling Gastrointestinal:  Denies nausea, heartburn or change in bowel habits Skin: Denies abnormal skin rashes Lymphatics: Denies new  lymphadenopathy or easy  bruising Neurological:Denies numbness, tingling or new weaknesses Behavioral/Psych: Mood is stable, no new changes  All other systems were reviewed with the patient and are negative.  I have reviewed the past medical history, past surgical history, social history and family history with the patient and they are unchanged from previous note.  ALLERGIES:  has No Known Allergies.  MEDICATIONS:  Current Outpatient Medications  Medication Sig Dispense Refill  . acyclovir (ZOVIRAX) 400 MG tablet Take 1 tablet (400 mg total) by mouth daily. 60 tablet 11  . allopurinol (ZYLOPRIM) 100 MG tablet Take 100 mg by mouth 2 (two) times daily.     Marland Kitchen amLODipine (NORVASC) 10 MG tablet Take 10 mg by mouth daily.  1  . aspirin EC 81 MG tablet Take 81 mg by mouth daily.    Marland Kitchen atorvastatin (LIPITOR) 20 MG tablet Take 20 mg by mouth at bedtime.     . brimonidine (ALPHAGAN) 0.2 % ophthalmic solution Place 1 drop into the right eye 3 (three) times daily.     . calcium carbonate (TUMS - DOSED IN MG ELEMENTAL CALCIUM) 500 MG chewable tablet Chew 1 tablet by mouth daily as needed for indigestion or heartburn.    . cholecalciferol (VITAMIN D) 1000 UNITS tablet Take 1,000 Units by mouth daily.    . dorzolamide-timolol (COSOPT) 22.3-6.8 MG/ML ophthalmic solution Place 1 drop into the right eye 2 (two) times daily.     . fluorometholone (FML) 0.1 % ophthalmic suspension Place 1 drop into the right eye daily.     Marland Kitchen latanoprost (XALATAN) 0.005 % ophthalmic solution Place 1 drop into the right eye at bedtime.    . lidocaine-prilocaine (EMLA) cream Apply to affected area once 30 g 3  . loperamide (IMODIUM A-D) 2 MG tablet 1 po after each watery BM.  Maximum 6 per 24hrs. (Patient not taking: Reported on 03/19/2018) 30 tablet PRN  . magnesium oxide (MAG-OX) 400 (241.3 Mg) MG tablet Take 1 tablet (400 mg total) by mouth 2 (two) times daily. 60 tablet 9  . magnesium oxide (MAG-OX) 400 MG tablet TAKE 1 TABLET BY MOUTH TWICE A DAY 180  tablet 3  . ondansetron (ZOFRAN) 4 MG tablet Take 1 tablet (4 mg total) by mouth every 6 (six) hours. 12 tablet 0  . ondansetron (ZOFRAN) 8 MG tablet Take 1 tablet (8 mg total) by mouth 2 (two) times daily as needed (Nausea or vomiting). 30 tablet 1  . pantoprazole (PROTONIX) 40 MG tablet Take 1 tablet (40 mg total) by mouth daily. (Patient not taking: Reported on 03/19/2018) 180 tablet 3  . pomalidomide (POMALYST) 3 MG capsule Take 1 capsule (3 mg total) by mouth daily. Take with water on days 1-21. Repeat every 28 days. 21 capsule 11   No current facility-administered medications for this visit.    Facility-Administered Medications Ordered in Other Visits  Medication Dose Route Frequency Provider Last Rate Last Dose  . acetaminophen (TYLENOL) tablet 650 mg  650 mg Oral Once Alvy Bimler, Daishia Fetterly, MD      . daratumumab (DARZALEX) 1,200 mg in sodium chloride 0.9 % 440 mL chemo infusion  15.4 mg/kg (Order-Specific) Intravenous Once Alvy Bimler, Cordelia Bessinger, MD      . diphenhydrAMINE (BENADRYL) capsule 50 mg  50 mg Oral Once Shirlee Whitmire, MD      . heparin lock flush 100 unit/mL  500 Units Intracatheter Once PRN Alvy Bimler, Elin Seats, MD      . methylPREDNISolone sodium succinate (SOLU-MEDROL) 40 mg/mL injection 40 mg  40 mg Intravenous Once Alvy Bimler, Winter Trefz, MD      . prochlorperazine (COMPAZINE) tablet 10 mg  10 mg Oral Once Alvy Bimler, Berkeley Veldman, MD      . sodium chloride flush (NS) 0.9 % injection 10 mL  10 mL Intracatheter PRN Alvy Bimler, Stephanieann Popescu, MD        PHYSICAL EXAMINATION: ECOG PERFORMANCE STATUS: 2 - Symptomatic, <50% confined to bed  Vitals:   09/28/18 0955  BP: (!) 131/57  Pulse: 65  Resp: 18  Temp: 98.7 F (37.1 C)  SpO2: 99%   Filed Weights   09/28/18 0955  Weight: 173 lb 3.2 oz (78.6 kg)    GENERAL:alert, no distress and comfortable SKIN: skin color, texture, turgor are normal, no rashes or significant lesions EYES: normal, Conjunctiva are pink and non-injected, sclera clear OROPHARYNX:no exudate, no erythema and lips,  buccal mucosa, and tongue normal  NECK: supple, thyroid normal size, non-tender, without nodularity LYMPH:  no palpable lymphadenopathy in the cervical, axillary or inguinal LUNGS: clear to auscultation and percussion with normal breathing effort HEART: regular rate & rhythm and no murmurs and no lower extremity edema ABDOMEN:abdomen soft, non-tender and normal bowel sounds Musculoskeletal:no cyanosis of digits and no clubbing  NEURO: alert & oriented x 3 with fluent speech, no focal motor/sensory deficits  LABORATORY DATA:  I have reviewed the data as listed    Component Value Date/Time   NA 141 09/28/2018 0940   NA 140 01/09/2017 0913   K 3.6 09/28/2018 0940   K 3.7 01/09/2017 0913   CL 109 09/28/2018 0940   CL 107 06/29/2012 1131   CO2 24 09/28/2018 0940   CO2 21 (L) 01/09/2017 0913   GLUCOSE 86 09/28/2018 0940   GLUCOSE 80 01/09/2017 0913   GLUCOSE 143 (H) 06/29/2012 1131   BUN 11 09/28/2018 0940   BUN 9.8 01/09/2017 0913   CREATININE 1.61 (H) 09/28/2018 0940   CREATININE 1.85 (H) 07/06/2018 0935   CREATININE 1.5 (H) 01/09/2017 0913   CALCIUM 8.8 (L) 09/28/2018 0940   CALCIUM 7.5 (L) 01/09/2017 0913   PROT 6.3 (L) 09/28/2018 0940   PROT 5.7 (L) 01/09/2017 0913   ALBUMIN 3.3 (L) 09/28/2018 0940   ALBUMIN 2.9 (L) 01/09/2017 0913   AST 12 (L) 09/28/2018 0940   AST 14 (L) 07/06/2018 0935   AST 12 01/09/2017 0913   ALT 12 09/28/2018 0940   ALT 11 07/06/2018 0935   ALT 10 01/09/2017 0913   ALKPHOS 58 09/28/2018 0940   ALKPHOS 55 01/09/2017 0913   BILITOT 0.6 09/28/2018 0940   BILITOT 0.5 07/06/2018 0935   BILITOT 0.44 01/09/2017 0913   GFRNONAA 41 (L) 09/28/2018 0940   GFRNONAA 35 (L) 07/06/2018 0935   GFRAA 48 (L) 09/28/2018 0940   GFRAA 40 (L) 07/06/2018 0935    No results found for: SPEP, UPEP  Lab Results  Component Value Date   WBC 8.8 09/28/2018   NEUTROABS 4.1 09/28/2018   HGB 8.7 (L) 09/28/2018   HCT 25.5 (L) 09/28/2018   MCV 94.1 09/28/2018   PLT  321 09/28/2018      Chemistry      Component Value Date/Time   NA 141 09/28/2018 0940   NA 140 01/09/2017 0913   K 3.6 09/28/2018 0940   K 3.7 01/09/2017 0913   CL 109 09/28/2018 0940   CL 107 06/29/2012 1131   CO2 24 09/28/2018 0940   CO2 21 (L) 01/09/2017 0913   BUN 11 09/28/2018 0940   BUN 9.8 01/09/2017  0913   CREATININE 1.61 (H) 09/28/2018 0940   CREATININE 1.85 (H) 07/06/2018 0935   CREATININE 1.5 (H) 01/09/2017 0913      Component Value Date/Time   CALCIUM 8.8 (L) 09/28/2018 0940   CALCIUM 7.5 (L) 01/09/2017 0913   ALKPHOS 58 09/28/2018 0940   ALKPHOS 55 01/09/2017 0913   AST 12 (L) 09/28/2018 0940   AST 14 (L) 07/06/2018 0935   AST 12 01/09/2017 0913   ALT 12 09/28/2018 0940   ALT 11 07/06/2018 0935   ALT 10 01/09/2017 0913   BILITOT 0.6 09/28/2018 0940   BILITOT 0.5 07/06/2018 0935   BILITOT 0.44 01/09/2017 0913       All questions were answered. The patient knows to call the clinic with any problems, questions or concerns. No barriers to learning was detected.  I spent 15 minutes counseling the patient face to face. The total time spent in the appointment was 20 minutes and more than 50% was on counseling and review of test results  Heath Lark, MD 09/28/2018 10:57 AM

## 2018-09-28 NOTE — Patient Instructions (Signed)
[facility] Discharge Instructions for Patients Receiving Chemotherapy  Today you received the following chemotherapy agents: Darzalex  To help prevent nausea and vomiting after your treatment, we encourage you to take your nausea medication as directed.    If you develop nausea and vomiting that is not controlled by your nausea medication, call the clinic.   BELOW ARE SYMPTOMS THAT SHOULD BE REPORTED IMMEDIATELY:  *FEVER GREATER THAN 100.5 F  *CHILLS WITH OR WITHOUT FEVER  NAUSEA AND VOMITING THAT IS NOT CONTROLLED WITH YOUR NAUSEA MEDICATION  *UNUSUAL SHORTNESS OF BREATH  *UNUSUAL BRUISING OR BLEEDING  TENDERNESS IN MOUTH AND THROAT WITH OR WITHOUT PRESENCE OF ULCERS  *URINARY PROBLEMS  *BOWEL PROBLEMS  UNUSUAL RASH Items with * indicate a potential emergency and should be followed up as soon as possible.  Feel free to call the clinic should you have any questions or concerns. The clinic phone number is [phone].  Please show the CHEMO ALERT CARD at check-in to the Emergency Department and triage nurse.   

## 2018-09-28 NOTE — Telephone Encounter (Signed)
Given schedule in infusion

## 2018-09-28 NOTE — Assessment & Plan Note (Signed)
His recent myeloma panel was stable.  Repeat myeloma panel from today is pending; the panel from 4 weeks ago showed stable disease control We will continue pomalidomide along with monthly daratumumab He will continue acyclovir for antimicrobial prophylaxis He will continue calcium with vitamin D along with Zometa 

## 2018-09-28 NOTE — Assessment & Plan Note (Signed)
He has mild chronic intermittent diarrhea, could be due to medication I recommend antidiarrheal medicine as needed

## 2018-09-28 NOTE — Assessment & Plan Note (Signed)
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

## 2018-09-28 NOTE — Assessment & Plan Note (Signed)
I recommend increase oral fluid intake as tolerated We will monitor his kidney function carefully. We will proceed regardless of creatinine function with daratumumab 

## 2018-09-29 ENCOUNTER — Other Ambulatory Visit: Payer: Self-pay

## 2018-09-29 DIAGNOSIS — C9002 Multiple myeloma in relapse: Secondary | ICD-10-CM

## 2018-09-29 LAB — KAPPA/LAMBDA LIGHT CHAINS
Kappa free light chain: 259.9 mg/L — ABNORMAL HIGH (ref 3.3–19.4)
Kappa, lambda light chain ratio: 23.41 — ABNORMAL HIGH (ref 0.26–1.65)
Lambda free light chains: 11.1 mg/L (ref 5.7–26.3)

## 2018-09-29 MED ORDER — POMALIDOMIDE 3 MG PO CAPS: 3.0000 mg | ORAL_CAPSULE | Freq: Every day | ORAL | 11 refills | Status: DC

## 2018-09-30 ENCOUNTER — Telehealth: Payer: Self-pay

## 2018-09-30 LAB — MULTIPLE MYELOMA PANEL, SERUM
Albumin SerPl Elph-Mcnc: 3.3 g/dL (ref 2.9–4.4)
Albumin/Glob SerPl: 1.2 (ref 0.7–1.7)
Alpha 1: 0.3 g/dL (ref 0.0–0.4)
Alpha2 Glob SerPl Elph-Mcnc: 0.7 g/dL (ref 0.4–1.0)
B-Globulin SerPl Elph-Mcnc: 0.7 g/dL (ref 0.7–1.3)
Gamma Glob SerPl Elph-Mcnc: 1.1 g/dL (ref 0.4–1.8)
Globulin, Total: 2.8 g/dL (ref 2.2–3.9)
IgA: 30 mg/dL — ABNORMAL LOW (ref 61–437)
IgG (Immunoglobin G), Serum: 1324 mg/dL (ref 603–1613)
IgM (Immunoglobulin M), Srm: 8 mg/dL — ABNORMAL LOW (ref 15–143)
M Protein SerPl Elph-Mcnc: 0.9 g/dL — ABNORMAL HIGH
Total Protein ELP: 6.1 g/dL (ref 6.0–8.5)

## 2018-09-30 NOTE — Telephone Encounter (Signed)
Called and given below message to wife. She verbalized understanding. 

## 2018-09-30 NOTE — Telephone Encounter (Signed)
-----   Message from Heath Lark, MD sent at 09/30/2018  2:40 PM EDT ----- Regarding: pls call his wife and ler her know myeloma panel is good

## 2018-10-13 DIAGNOSIS — E782 Mixed hyperlipidemia: Secondary | ICD-10-CM | POA: Diagnosis not present

## 2018-10-13 DIAGNOSIS — N189 Chronic kidney disease, unspecified: Secondary | ICD-10-CM | POA: Diagnosis not present

## 2018-10-13 DIAGNOSIS — I10 Essential (primary) hypertension: Secondary | ICD-10-CM | POA: Diagnosis not present

## 2018-10-13 DIAGNOSIS — C9002 Multiple myeloma in relapse: Secondary | ICD-10-CM | POA: Diagnosis not present

## 2018-10-26 ENCOUNTER — Inpatient Hospital Stay: Payer: Medicare Other | Attending: Hematology and Oncology

## 2018-10-26 ENCOUNTER — Inpatient Hospital Stay (HOSPITAL_BASED_OUTPATIENT_CLINIC_OR_DEPARTMENT_OTHER): Payer: Medicare Other | Admitting: Hematology and Oncology

## 2018-10-26 ENCOUNTER — Inpatient Hospital Stay: Payer: Medicare Other

## 2018-10-26 ENCOUNTER — Other Ambulatory Visit: Payer: Self-pay

## 2018-10-26 ENCOUNTER — Encounter: Payer: Self-pay | Admitting: Hematology and Oncology

## 2018-10-26 VITALS — BP 133/59 | HR 56 | Temp 97.0°F | Resp 18

## 2018-10-26 DIAGNOSIS — N1831 Chronic kidney disease, stage 3a: Secondary | ICD-10-CM

## 2018-10-26 DIAGNOSIS — Z5112 Encounter for antineoplastic immunotherapy: Secondary | ICD-10-CM | POA: Diagnosis not present

## 2018-10-26 DIAGNOSIS — Z9221 Personal history of antineoplastic chemotherapy: Secondary | ICD-10-CM | POA: Diagnosis not present

## 2018-10-26 DIAGNOSIS — D631 Anemia in chronic kidney disease: Secondary | ICD-10-CM | POA: Diagnosis not present

## 2018-10-26 DIAGNOSIS — M8580 Other specified disorders of bone density and structure, unspecified site: Secondary | ICD-10-CM | POA: Insufficient documentation

## 2018-10-26 DIAGNOSIS — N183 Chronic kidney disease, stage 3 unspecified: Secondary | ICD-10-CM | POA: Diagnosis not present

## 2018-10-26 DIAGNOSIS — C9002 Multiple myeloma in relapse: Secondary | ICD-10-CM

## 2018-10-26 DIAGNOSIS — K449 Diaphragmatic hernia without obstruction or gangrene: Secondary | ICD-10-CM | POA: Insufficient documentation

## 2018-10-26 DIAGNOSIS — Z79899 Other long term (current) drug therapy: Secondary | ICD-10-CM | POA: Insufficient documentation

## 2018-10-26 DIAGNOSIS — Z95828 Presence of other vascular implants and grafts: Secondary | ICD-10-CM

## 2018-10-26 LAB — COMPREHENSIVE METABOLIC PANEL
ALT: 9 U/L (ref 0–44)
AST: 11 U/L — ABNORMAL LOW (ref 15–41)
Albumin: 3.2 g/dL — ABNORMAL LOW (ref 3.5–5.0)
Alkaline Phosphatase: 58 U/L (ref 38–126)
Anion gap: 7 (ref 5–15)
BUN: 12 mg/dL (ref 8–23)
CO2: 23 mmol/L (ref 22–32)
Calcium: 8.4 mg/dL — ABNORMAL LOW (ref 8.9–10.3)
Chloride: 109 mmol/L (ref 98–111)
Creatinine, Ser: 1.5 mg/dL — ABNORMAL HIGH (ref 0.61–1.24)
GFR calc Af Amer: 52 mL/min — ABNORMAL LOW (ref >60)
GFR calc non Af Amer: 45 mL/min — ABNORMAL LOW (ref >60)
Glucose, Bld: 87 mg/dL (ref 70–99)
Potassium: 4 mmol/L (ref 3.5–5.1)
Sodium: 139 mmol/L (ref 135–145)
Total Bilirubin: 0.5 mg/dL (ref 0.3–1.2)
Total Protein: 6.6 g/dL (ref 6.5–8.1)

## 2018-10-26 LAB — CBC WITH DIFFERENTIAL/PLATELET
Abs Immature Granulocytes: 0.08 10*3/uL — ABNORMAL HIGH (ref 0.00–0.07)
Basophils Absolute: 0.2 10*3/uL — ABNORMAL HIGH (ref 0.0–0.1)
Basophils Relative: 2 %
Eosinophils Absolute: 0.3 10*3/uL (ref 0.0–0.5)
Eosinophils Relative: 4 %
HCT: 26.7 % — ABNORMAL LOW (ref 39.0–52.0)
Hemoglobin: 8.8 g/dL — ABNORMAL LOW (ref 13.0–17.0)
Immature Granulocytes: 1 %
Lymphocytes Relative: 51 %
Lymphs Abs: 4.5 10*3/uL — ABNORMAL HIGH (ref 0.7–4.0)
MCH: 32.4 pg (ref 26.0–34.0)
MCHC: 33 g/dL (ref 30.0–36.0)
MCV: 98.2 fL (ref 80.0–100.0)
Monocytes Absolute: 0.7 10*3/uL (ref 0.1–1.0)
Monocytes Relative: 8 %
Neutro Abs: 3 10*3/uL (ref 1.7–7.7)
Neutrophils Relative %: 34 %
Platelets: 290 10*3/uL (ref 150–400)
RBC: 2.72 MIL/uL — ABNORMAL LOW (ref 4.22–5.81)
RDW: 16.9 % — ABNORMAL HIGH (ref 11.5–15.5)
WBC: 8.7 10*3/uL (ref 4.0–10.5)
nRBC: 0 % (ref 0.0–0.2)

## 2018-10-26 MED ORDER — ACETAMINOPHEN 325 MG PO TABS
ORAL_TABLET | ORAL | Status: AC
  Filled 2018-10-26: qty 2

## 2018-10-26 MED ORDER — DIPHENHYDRAMINE HCL 25 MG PO CAPS
ORAL_CAPSULE | ORAL | Status: AC
  Filled 2018-10-26: qty 2

## 2018-10-26 MED ORDER — SODIUM CHLORIDE 0.9% FLUSH
10.0000 mL | Freq: Once | INTRAVENOUS | Status: AC
  Administered 2018-10-26: 10 mL
  Filled 2018-10-26: qty 10

## 2018-10-26 MED ORDER — HEPARIN SOD (PORK) LOCK FLUSH 100 UNIT/ML IV SOLN
500.0000 [IU] | Freq: Once | INTRAVENOUS | Status: AC | PRN
  Administered 2018-10-26: 14:00:00 500 [IU]
  Filled 2018-10-26: qty 5

## 2018-10-26 MED ORDER — SODIUM CHLORIDE 0.9 % IV SOLN
15.4000 mg/kg | Freq: Once | INTRAVENOUS | Status: AC
  Administered 2018-10-26: 12:00:00 1200 mg via INTRAVENOUS
  Filled 2018-10-26: qty 60

## 2018-10-26 MED ORDER — METHYLPREDNISOLONE SODIUM SUCC 40 MG IJ SOLR
INTRAMUSCULAR | Status: AC
  Filled 2018-10-26: qty 1

## 2018-10-26 MED ORDER — ACETAMINOPHEN 325 MG PO TABS
650.0000 mg | ORAL_TABLET | Freq: Once | ORAL | Status: AC
  Administered 2018-10-26: 11:00:00 650 mg via ORAL

## 2018-10-26 MED ORDER — METHYLPREDNISOLONE SODIUM SUCC 40 MG IJ SOLR
40.0000 mg | Freq: Once | INTRAMUSCULAR | Status: AC
  Administered 2018-10-26: 40 mg via INTRAVENOUS

## 2018-10-26 MED ORDER — SODIUM CHLORIDE 0.9 % IV SOLN
Freq: Once | INTRAVENOUS | Status: AC
  Administered 2018-10-26: 11:00:00 via INTRAVENOUS
  Filled 2018-10-26: qty 250

## 2018-10-26 MED ORDER — PROCHLORPERAZINE MALEATE 10 MG PO TABS
10.0000 mg | ORAL_TABLET | Freq: Once | ORAL | Status: AC
  Administered 2018-10-26: 11:00:00 10 mg via ORAL

## 2018-10-26 MED ORDER — DIPHENHYDRAMINE HCL 25 MG PO CAPS
50.0000 mg | ORAL_CAPSULE | Freq: Once | ORAL | Status: AC
  Administered 2018-10-26: 11:00:00 50 mg via ORAL

## 2018-10-26 MED ORDER — SODIUM CHLORIDE 0.9% FLUSH
10.0000 mL | INTRAVENOUS | Status: DC | PRN
  Administered 2018-10-26: 14:00:00 10 mL
  Filled 2018-10-26: qty 10

## 2018-10-26 MED ORDER — PROCHLORPERAZINE MALEATE 10 MG PO TABS
ORAL_TABLET | ORAL | Status: AC
  Filled 2018-10-26: qty 1

## 2018-10-26 NOTE — Assessment & Plan Note (Signed)
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 

## 2018-10-26 NOTE — Progress Notes (Signed)
[facility] OFFICE PROGRESS NOTE  Patient Care Team: Lucianne Lei, MD as PCP - General (Family Medicine) Heath Lark, MD as Consulting Physician (Hematology and Oncology) Beverely Pace, LCSW as Social Worker (General Practice)  ASSESSMENT & PLAN:  Multiple myeloma in relapse [facility]) His recent myeloma panel was stable.  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 We will proceed regardless of creatinine function with daratumumab    No orders of the defined types were placed in this encounter.   INTERVAL HISTORY: Please see below for problem oriented charting. He returns for further follow-up and treatment No infusion reaction He denies difficulties getting his Pomalyst refill No new bone pain Denies recent bleeding He denies diarrhea His appetite is fair  SUMMARY OF ONCOLOGIC HISTORY: Oncology History  Multiple myeloma in relapse ([place])  12/12/2010 Initial Diagnosis   Multiple myeloma   02/16/2014 Imaging   Skeletal survey showed diffuse osteopenia   03/02/2014 Bone Marrow Biopsy   Accession: [id number] BM biopsy showed only 5 % plasma cells. However, the biopsy was difficult and the bone was fragmented during the procedure. Cytogenetics and FISH study is normal   03/03/2014 Procedure   he has port placement   03/10/2014 - 05/19/2014 Chemotherapy   he received elotuzumab and revlimid   05/20/2014 - 02/25/2016 Chemotherapy   Treatment is  switched to maintenance Revlimid only. Treatment is stopped due to progressive disease   11/15/2014 Adverse Reaction   He has mild worsening anemia. Does of Revlimid reduced to 5 mg 21 days on, 7 days off   03/06/2016 -  Chemotherapy   He received Daratumumab and Velcade. Velcade is stopped on 07/24/16   03/06/2016 -  Chemotherapy   The patient had daratumumab (DARZALEX) 1,200 mg in sodium chloride 0.9 % 940 mL (1.2 mg/mL) chemo infusion, 15.3 mg/kg = 1,260 mg, Intravenous, Once, 1 of 1 cycle Administration: 1,200 mg (03/06/2016) daratumumab (DARZALEX) 1,200 mg in sodium chloride 0.9 % 440 mL (2.4 mg/mL) chemo infusion, 15.3 mg/kg = 1,260 mg, Intravenous, Once, 14 of 14 cycles Administration: 1,200 mg (03/13/2016), 1,200 mg (03/20/2016), 1,200 mg (05/01/2016), 1,200 mg (05/22/2016), 1,200 mg (03/27/2016), 1,200 mg (04/03/2016), 1,200 mg (04/10/2016), 1,200 mg (04/17/2016), 1,200 mg (04/24/2016), 1,200 mg (06/12/2016), 1,200 mg (07/03/2016), 1,200 mg (07/24/2016), 1,200 mg (08/14/2016), 1,200 mg (09/04/2016) daratumumab (DARZALEX) 1,200 mg in sodium chloride 0.9 % 440 mL chemo infusion, 15.2 mg/kg = 1,260 mg, Intravenous, Once, 27 of 30 cycles Dose modification: 16 mg/kg (original dose 16 mg/kg, Cycle 40, Reason: Other (see comments), Comment: weight gain) Administration: 1,200 mg (09/25/2016), 1,200 mg (10/16/2016), 1,200 mg (11/13/2016), 1,100 mg (01/09/2017), 1,100 mg (12/12/2016), 1,100 mg (02/06/2017), 1,100 mg (03/06/2017), 1,100 mg (04/03/2017), 1,100 mg (05/05/2017), 1,100 mg (06/05/2017), 1,100 mg (07/03/2017), 1,100 mg (07/31/2017), 1,100 mg (08/28/2017), 1,100 mg (09/29/2017), 1,100 mg (10/27/2017), 1,100 mg (11/24/2017), 1,100 mg (12/22/2017), 1,100 mg (01/19/2018), 1,100 mg (02/16/2018), 1,100 mg (03/16/2018), 1,100 mg (04/13/2018), 1,100 mg (05/11/2018), 1,100 mg (06/08/2018), 1,200 mg (07/06/2018), 1,200 mg (08/03/2018), 1,200 mg (08/31/2018), 1,200 mg (09/28/2018)  for  chemotherapy treatment.    08/14/2016 Miscellaneous   Pomalyst is  added along with Daratumumab   10/17/2016 Surgery   EGD was performed for coffee-ground emesis. Multiple linear superficial ulcers were noted in the esophagus from 25-35 cm from insertion. Severe esophagitis was noted from 30-35 cm from insertion. Multiple biopsies were obtained. A small hiatal hernia was noted. The gastric cavity contained coffee-ground fluid, after suctioning and lavage, no obvious gastric lesions/erosion/ulceration/erythema was noted. Biopsies were taken from the antrum to rule out H. Pylori. Duodenum bulb appeared unremarkable. A small diverticulum was noted in second portion of the duodenum.     12/06/2016 Imaging   Ct scan abdomen No acute findings.  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  Stable small hiatal hernia.  Colonic diverticulosis, without radiographic evidence of diverticulitis.     REVIEW OF SYSTEMS:   Constitutional: Denies fevers, chills or abnormal weight loss Eyes: Denies blurriness of vision Ears, nose, mouth, throat, and face: Denies mucositis or sore throat Respiratory: Denies cough, dyspnea or wheezes Cardiovascular: Denies palpitation, chest discomfort or lower extremity swelling Gastrointestinal:  Denies nausea, heartburn or change in bowel habits Skin: Denies abnormal skin rashes Lymphatics: Denies new lymphadenopathy or easy bruising Neurological:Denies numbness, tingling or new weaknesses Behavioral/Psych: Mood is stable, no new changes  All other systems were reviewed with the patient and are negative.  I have reviewed the past medical history, past surgical history, social history and family history with the patient and they are unchanged from previous note.  ALLERGIES:  has No Known Allergies.  MEDICATIONS:  Current Outpatient Medications  Medication Sig Dispense Refill  . acyclovir (ZOVIRAX) 400 MG tablet Take 1 tablet (400 mg total) by mouth daily. 60 tablet 11  . allopurinol  (ZYLOPRIM) 100 MG tablet Take 100 mg by mouth 2 (two) times daily.     Marland Kitchen amLODipine (NORVASC) 10 MG tablet Take 10 mg by mouth daily.  1  . aspirin EC 81 MG tablet Take 81 mg by mouth daily.    Marland Kitchen atorvastatin (LIPITOR) 20 MG tablet Take 20 mg by mouth at bedtime.     . brimonidine (ALPHAGAN) 0.2 % ophthalmic solution Place 1 drop into the right eye 3 (three) times daily.     . calcium carbonate (TUMS - DOSED IN MG ELEMENTAL CALCIUM) 500 MG chewable tablet Chew 1 tablet by mouth daily as needed for indigestion or heartburn.    . cholecalciferol (VITAMIN D) 1000 UNITS tablet Take 1,000 Units by mouth daily.    . dorzolamide-timolol (COSOPT) 22.3-6.8 MG/ML ophthalmic solution Place 1 drop into the right eye 2 (two) times daily.     . fluorometholone (FML) 0.1 % ophthalmic suspension Place 1 drop into the right eye daily.     Marland Kitchen latanoprost (XALATAN) 0.005 % ophthalmic solution Place 1 drop into the right eye at bedtime.    . lidocaine-prilocaine (EMLA) cream Apply to affected area once 30 g 3  . ondansetron (ZOFRAN) 4 MG tablet Take 1 tablet (4 mg total) by mouth every 6 (six) hours. 12 tablet 0  . ondansetron (ZOFRAN) 8 MG tablet Take 1 tablet (8 mg total) by mouth 2 (two) times daily as needed (Nausea or vomiting). 30 tablet 1  . pantoprazole (PROTONIX) 40 MG tablet Take 1 tablet (40 mg total) by mouth daily. (Patient not taking: Reported on 03/19/2018) 180 tablet 3  . pomalidomide (POMALYST) 3 MG capsule Take 1 capsule (3 mg total) by mouth daily. Take with water on days  1-21. Repeat every 28 days. 21 capsule 11   No current facility-administered medications for this visit.     PHYSICAL EXAMINATION: ECOG PERFORMANCE STATUS: 1 - Symptomatic but completely ambulatory  Vitals:   10/26/18 1019  BP: (!) 149/54  Pulse: 69  Resp: 18  Temp: 98 F (36.7 C)  SpO2: 100%   There were no vitals filed for this visit.  GENERAL:alert, no distress and comfortable SKIN: skin color, texture, turgor are  normal, no rashes or significant lesions EYES: normal, Conjunctiva are pink and non-injected, sclera clear OROPHARYNX:no exudate, no erythema and lips, buccal mucosa, and tongue normal  NECK: supple, thyroid normal size, non-tender, without nodularity LYMPH:  no palpable lymphadenopathy in the cervical, axillary or inguinal LUNGS: clear to auscultation and percussion with normal breathing effort HEART: regular rate & rhythm and no murmurs and no lower extremity edema ABDOMEN:abdomen soft, non-tender and normal bowel sounds Musculoskeletal:no cyanosis of digits and no clubbing  NEURO: alert & oriented x 3 with fluent speech, no focal motor/sensory deficits  LABORATORY DATA:  I have reviewed the data as listed    Component Value Date/Time   NA 141 09/28/2018 0940   NA 140 01/09/2017 0913   K 3.6 09/28/2018 0940   K 3.7 01/09/2017 0913   CL 109 09/28/2018 0940   CL 107 06/29/2012 1131   CO2 24 09/28/2018 0940   CO2 21 (L) 01/09/2017 0913   GLUCOSE 86 09/28/2018 0940   GLUCOSE 80 01/09/2017 0913   GLUCOSE 143 (H) 06/29/2012 1131   BUN 11 09/28/2018 0940   BUN 9.8 01/09/2017 0913   CREATININE 1.61 (H) 09/28/2018 0940   CREATININE 1.85 (H) 07/06/2018 0935   CREATININE 1.5 (H) 01/09/2017 0913   CALCIUM 8.8 (L) 09/28/2018 0940   CALCIUM 7.5 (L) 01/09/2017 0913   PROT 6.3 (L) 09/28/2018 0940   PROT 5.7 (L) 01/09/2017 0913   ALBUMIN 3.3 (L) 09/28/2018 0940   ALBUMIN 2.9 (L) 01/09/2017 0913   AST 12 (L) 09/28/2018 0940   AST 14 (L) 07/06/2018 0935   AST 12 01/09/2017 0913   ALT 12 09/28/2018 0940   ALT 11 07/06/2018 0935   ALT 10 01/09/2017 0913   ALKPHOS 58 09/28/2018 0940   ALKPHOS 55 01/09/2017 0913   BILITOT 0.6 09/28/2018 0940   BILITOT 0.5 07/06/2018 0935   BILITOT 0.44 01/09/2017 0913   GFRNONAA 41 (L) 09/28/2018 0940   GFRNONAA 35 (L) 07/06/2018 0935   GFRAA 48 (L) 09/28/2018 0940   GFRAA 40 (L) 07/06/2018 0935    No results found for: SPEP, UPEP  Lab Results   Component Value Date   WBC 8.7 10/26/2018   NEUTROABS 3.0 10/26/2018   HGB 8.8 (L) 10/26/2018   HCT 26.7 (L) 10/26/2018   MCV 98.2 10/26/2018   PLT 290 10/26/2018      Chemistry      Component Value Date/Time   NA 141 09/28/2018 0940   NA 140 01/09/2017 0913   K 3.6 09/28/2018 0940   K 3.7 01/09/2017 0913   CL 109 09/28/2018 0940   CL 107 06/29/2012 1131   CO2 24 09/28/2018 0940   CO2 21 (L) 01/09/2017 0913   BUN 11 09/28/2018 0940   BUN 9.8 01/09/2017 0913   CREATININE 1.61 (H) 09/28/2018 0940   CREATININE 1.85 (H) 07/06/2018 0935   CREATININE 1.5 (H) 01/09/2017 0913      Component Value Date/Time   CALCIUM 8.8 (L) 09/28/2018 0940   CALCIUM 7.5 (L) 01/09/2017 0913  ALKPHOS 58 09/28/2018 0940   ALKPHOS 55 01/09/2017 0913   AST 12 (L) 09/28/2018 0940   AST 14 (L) 07/06/2018 0935   AST 12 01/09/2017 0913   ALT 12 09/28/2018 0940   ALT 11 07/06/2018 0935   ALT 10 01/09/2017 0913   BILITOT 0.6 09/28/2018 0940   BILITOT 0.5 07/06/2018 0935   BILITOT 0.44 01/09/2017 0913       All questions were answered. The patient knows to call the clinic with any problems, questions or concerns. No barriers to learning was detected.  I spent 15 minutes counseling the patient face to face. The total time spent in the appointment was 20 minutes and more than 50% was on counseling and review of test results  Heath Lark, MD 10/26/2018 10:50 AM

## 2018-10-26 NOTE — Patient Instructions (Signed)

## 2018-10-26 NOTE — Assessment & Plan Note (Signed)
His recent myeloma panel was stable.  Repeat myeloma panel from today is pending; the panel from 4 weeks ago showed stable disease control We will continue pomalidomide along with monthly daratumumab He will continue acyclovir for antimicrobial prophylaxis He will continue calcium with vitamin D along with Zometa 

## 2018-10-26 NOTE — Patient Instructions (Signed)
[facility] Discharge Instructions for Patients Receiving Immunotherapy Today you received the following immunotherapy agent: DARARTUMUMAB  To help prevent nausea and vomiting after your treatment, we encourage you to take your nausea medication as prescribed . If you develop nausea and vomiting that is not controlled by your nausea medication, call the clinic.   BELOW ARE SYMPTOMS THAT SHOULD BE REPORTED IMMEDIATELY:  *FEVER GREATER THAN 100.5 F  *CHILLS WITH OR WITHOUT FEVER  NAUSEA AND VOMITING THAT IS NOT CONTROLLED WITH YOUR NAUSEA MEDICATION  *UNUSUAL SHORTNESS OF BREATH  *UNUSUAL BRUISING OR BLEEDING  TENDERNESS IN MOUTH AND THROAT WITH OR WITHOUT PRESENCE OF ULCERS  *URINARY PROBLEMS  *BOWEL PROBLEMS  UNUSUAL RASH Items with * indicate a potential emergency and should be followed up as soon as possible.  Feel free to call the clinic should you have any questions or concerns. The clinic phone number is [phone].  Please show the [facility] at check-in to the Emergency Department and triage nurse.

## 2018-10-26 NOTE — Assessment & Plan Note (Signed)
I recommend increase oral fluid intake as tolerated We will monitor his kidney function carefully. We will proceed regardless of creatinine function with daratumumab 

## 2018-10-27 LAB — KAPPA/LAMBDA LIGHT CHAINS
Kappa free light chain: 277.5 mg/L — ABNORMAL HIGH (ref 3.3–19.4)
Kappa, lambda light chain ratio: 26.94 — ABNORMAL HIGH (ref 0.26–1.65)
Lambda free light chains: 10.3 mg/L (ref 5.7–26.3)

## 2018-10-28 ENCOUNTER — Telehealth: Payer: Self-pay | Admitting: Hematology and Oncology

## 2018-10-28 LAB — MULTIPLE MYELOMA PANEL, SERUM
Albumin SerPl Elph-Mcnc: 3.4 g/dL (ref 2.9–4.4)
Albumin/Glob SerPl: 1.2 (ref 0.7–1.7)
Alpha 1: 0.2 g/dL (ref 0.0–0.4)
Alpha2 Glob SerPl Elph-Mcnc: 0.7 g/dL (ref 0.4–1.0)
B-Globulin SerPl Elph-Mcnc: 0.7 g/dL (ref 0.7–1.3)
Gamma Glob SerPl Elph-Mcnc: 1.3 g/dL (ref 0.4–1.8)
Globulin, Total: 2.9 g/dL (ref 2.2–3.9)
IgA: 30 mg/dL — ABNORMAL LOW (ref 61–437)
IgG (Immunoglobin G), Serum: 1562 mg/dL (ref 603–1613)
IgM (Immunoglobulin M), Srm: 7 mg/dL — ABNORMAL LOW (ref 15–143)
M Protein SerPl Elph-Mcnc: 1.1 g/dL — ABNORMAL HIGH
Total Protein ELP: 6.3 g/dL (ref 6.0–8.5)

## 2018-10-28 NOTE — Telephone Encounter (Signed)
Scheduled appt per 10/19 sch message- called pt - no answer and unable to leave message. Mailed letter with appt date and time    And messaged MD to let them know . Both numbers called

## 2018-10-29 ENCOUNTER — Other Ambulatory Visit: Payer: Self-pay | Admitting: *Deleted

## 2018-10-29 DIAGNOSIS — C9002 Multiple myeloma in relapse: Secondary | ICD-10-CM

## 2018-10-29 MED ORDER — POMALIDOMIDE 3 MG PO CAPS: 3.0000 mg | ORAL_CAPSULE | Freq: Every day | ORAL | 11 refills | Status: DC

## 2018-11-05 DIAGNOSIS — H43813 Vitreous degeneration, bilateral: Secondary | ICD-10-CM | POA: Diagnosis not present

## 2018-11-05 DIAGNOSIS — H26492 Other secondary cataract, left eye: Secondary | ICD-10-CM | POA: Diagnosis not present

## 2018-11-05 DIAGNOSIS — H4720 Unspecified optic atrophy: Secondary | ICD-10-CM | POA: Diagnosis not present

## 2018-11-05 DIAGNOSIS — H5213 Myopia, bilateral: Secondary | ICD-10-CM | POA: Diagnosis not present

## 2018-11-23 ENCOUNTER — Other Ambulatory Visit: Payer: Self-pay | Admitting: Hematology and Oncology

## 2018-11-23 ENCOUNTER — Inpatient Hospital Stay: Payer: Medicare Other | Attending: Hematology and Oncology

## 2018-11-23 ENCOUNTER — Inpatient Hospital Stay: Payer: Medicare Other

## 2018-11-23 ENCOUNTER — Other Ambulatory Visit: Payer: Self-pay

## 2018-11-23 ENCOUNTER — Inpatient Hospital Stay (HOSPITAL_BASED_OUTPATIENT_CLINIC_OR_DEPARTMENT_OTHER): Payer: Medicare Other | Admitting: Hematology and Oncology

## 2018-11-23 VITALS — BP 125/62 | HR 58 | Temp 97.9°F | Resp 18

## 2018-11-23 DIAGNOSIS — Z95828 Presence of other vascular implants and grafts: Secondary | ICD-10-CM

## 2018-11-23 DIAGNOSIS — D631 Anemia in chronic kidney disease: Secondary | ICD-10-CM | POA: Diagnosis not present

## 2018-11-23 DIAGNOSIS — N1831 Chronic kidney disease, stage 3a: Secondary | ICD-10-CM

## 2018-11-23 DIAGNOSIS — M8580 Other specified disorders of bone density and structure, unspecified site: Secondary | ICD-10-CM | POA: Diagnosis not present

## 2018-11-23 DIAGNOSIS — K449 Diaphragmatic hernia without obstruction or gangrene: Secondary | ICD-10-CM | POA: Insufficient documentation

## 2018-11-23 DIAGNOSIS — N183 Chronic kidney disease, stage 3 unspecified: Secondary | ICD-10-CM | POA: Diagnosis not present

## 2018-11-23 DIAGNOSIS — C9002 Multiple myeloma in relapse: Secondary | ICD-10-CM

## 2018-11-23 DIAGNOSIS — Z5112 Encounter for antineoplastic immunotherapy: Secondary | ICD-10-CM | POA: Diagnosis not present

## 2018-11-23 DIAGNOSIS — Z79899 Other long term (current) drug therapy: Secondary | ICD-10-CM | POA: Insufficient documentation

## 2018-11-23 LAB — COMPREHENSIVE METABOLIC PANEL
ALT: 10 U/L (ref 0–44)
AST: 12 U/L — ABNORMAL LOW (ref 15–41)
Albumin: 3.3 g/dL — ABNORMAL LOW (ref 3.5–5.0)
Alkaline Phosphatase: 55 U/L (ref 38–126)
Anion gap: 9 (ref 5–15)
BUN: 14 mg/dL (ref 8–23)
CO2: 20 mmol/L — ABNORMAL LOW (ref 22–32)
Calcium: 7.5 mg/dL — ABNORMAL LOW (ref 8.9–10.3)
Chloride: 110 mmol/L (ref 98–111)
Creatinine, Ser: 1.65 mg/dL — ABNORMAL HIGH (ref 0.61–1.24)
GFR calc Af Amer: 46 mL/min — ABNORMAL LOW (ref >60)
GFR calc non Af Amer: 40 mL/min — ABNORMAL LOW (ref >60)
Glucose, Bld: 94 mg/dL (ref 70–99)
Potassium: 3.6 mmol/L (ref 3.5–5.1)
Sodium: 139 mmol/L (ref 135–145)
Total Bilirubin: 0.7 mg/dL (ref 0.3–1.2)
Total Protein: 6.5 g/dL (ref 6.5–8.1)

## 2018-11-23 LAB — CBC WITH DIFFERENTIAL/PLATELET
Abs Immature Granulocytes: 0.07 10*3/uL (ref 0.00–0.07)
Basophils Absolute: 0.2 10*3/uL — ABNORMAL HIGH (ref 0.0–0.1)
Basophils Relative: 2 %
Eosinophils Absolute: 0.2 10*3/uL (ref 0.0–0.5)
Eosinophils Relative: 3 %
HCT: 26.9 % — ABNORMAL LOW (ref 39.0–52.0)
Hemoglobin: 8.8 g/dL — ABNORMAL LOW (ref 13.0–17.0)
Immature Granulocytes: 1 %
Lymphocytes Relative: 55 %
Lymphs Abs: 4.1 10*3/uL — ABNORMAL HIGH (ref 0.7–4.0)
MCH: 32.2 pg (ref 26.0–34.0)
MCHC: 32.7 g/dL (ref 30.0–36.0)
MCV: 98.5 fL (ref 80.0–100.0)
Monocytes Absolute: 0.5 10*3/uL (ref 0.1–1.0)
Monocytes Relative: 7 %
Neutro Abs: 2.4 10*3/uL (ref 1.7–7.7)
Neutrophils Relative %: 32 %
Platelets: 223 10*3/uL (ref 150–400)
RBC: 2.73 MIL/uL — ABNORMAL LOW (ref 4.22–5.81)
RDW: 16.1 % — ABNORMAL HIGH (ref 11.5–15.5)
WBC: 7.5 10*3/uL (ref 4.0–10.5)
nRBC: 0 % (ref 0.0–0.2)

## 2018-11-23 MED ORDER — DIPHENHYDRAMINE HCL 25 MG PO CAPS
ORAL_CAPSULE | ORAL | Status: AC
  Filled 2018-11-23: qty 2

## 2018-11-23 MED ORDER — SODIUM CHLORIDE 0.9 % IV SOLN
Freq: Once | INTRAVENOUS | Status: AC
  Administered 2018-11-23: 14:00:00 via INTRAVENOUS
  Filled 2018-11-23: qty 250

## 2018-11-23 MED ORDER — METHYLPREDNISOLONE SODIUM SUCC 40 MG IJ SOLR
INTRAMUSCULAR | Status: AC
  Filled 2018-11-23: qty 1

## 2018-11-23 MED ORDER — HEPARIN SOD (PORK) LOCK FLUSH 100 UNIT/ML IV SOLN
500.0000 [IU] | Freq: Once | INTRAVENOUS | Status: AC | PRN
  Administered 2018-11-23: 500 [IU]
  Filled 2018-11-23: qty 5

## 2018-11-23 MED ORDER — ACETAMINOPHEN 325 MG PO TABS
650.0000 mg | ORAL_TABLET | Freq: Once | ORAL | Status: AC
  Administered 2018-11-23: 650 mg via ORAL

## 2018-11-23 MED ORDER — SODIUM CHLORIDE 0.9 % IV SOLN
15.4000 mg/kg | Freq: Once | INTRAVENOUS | Status: AC
  Administered 2018-11-23: 1200 mg via INTRAVENOUS
  Filled 2018-11-23: qty 60

## 2018-11-23 MED ORDER — PROCHLORPERAZINE MALEATE 10 MG PO TABS
ORAL_TABLET | ORAL | Status: AC
  Filled 2018-11-23: qty 1

## 2018-11-23 MED ORDER — METHYLPREDNISOLONE SODIUM SUCC 40 MG IJ SOLR
40.0000 mg | Freq: Once | INTRAMUSCULAR | Status: AC
  Administered 2018-11-23: 40 mg via INTRAVENOUS

## 2018-11-23 MED ORDER — ACETAMINOPHEN 325 MG PO TABS
ORAL_TABLET | ORAL | Status: AC
  Filled 2018-11-23: qty 2

## 2018-11-23 MED ORDER — SODIUM CHLORIDE 0.9% FLUSH
10.0000 mL | INTRAVENOUS | Status: DC | PRN
  Administered 2018-11-23: 10 mL
  Filled 2018-11-23: qty 10

## 2018-11-23 MED ORDER — DIPHENHYDRAMINE HCL 25 MG PO CAPS
50.0000 mg | ORAL_CAPSULE | Freq: Once | ORAL | Status: AC
  Administered 2018-11-23: 50 mg via ORAL

## 2018-11-23 MED ORDER — PROCHLORPERAZINE MALEATE 10 MG PO TABS
10.0000 mg | ORAL_TABLET | Freq: Once | ORAL | Status: AC
  Administered 2018-11-23: 10 mg via ORAL

## 2018-11-23 MED ORDER — SODIUM CHLORIDE 0.9% FLUSH
10.0000 mL | Freq: Once | INTRAVENOUS | Status: AC
  Administered 2018-11-23: 10 mL
  Filled 2018-11-23: qty 10

## 2018-11-23 NOTE — Patient Instructions (Signed)
[facility] Discharge Instructions for Patients Receiving Chemotherapy  Today you received the following chemotherapy agents :  Daratumumab.  To help prevent nausea and vomiting after your treatment, we encourage you to take your nausea medication as prescribed.   If you develop nausea and vomiting that is not controlled by your nausea medication, call the clinic.   BELOW ARE SYMPTOMS THAT SHOULD BE REPORTED IMMEDIATELY:  *FEVER GREATER THAN 100.5 F  *CHILLS WITH OR WITHOUT FEVER  NAUSEA AND VOMITING THAT IS NOT CONTROLLED WITH YOUR NAUSEA MEDICATION  *UNUSUAL SHORTNESS OF BREATH  *UNUSUAL BRUISING OR BLEEDING  TENDERNESS IN MOUTH AND THROAT WITH OR WITHOUT PRESENCE OF ULCERS  *URINARY PROBLEMS  *BOWEL PROBLEMS  UNUSUAL RASH Items with * indicate a potential emergency and should be followed up as soon as possible.  Feel free to call the clinic should you have any questions or concerns. The clinic phone number is [phone].  Please show the CHEMO ALERT CARD at check-in to the Emergency Department and triage nurse.   

## 2018-11-24 LAB — KAPPA/LAMBDA LIGHT CHAINS
Kappa free light chain: 251 mg/L — ABNORMAL HIGH (ref 3.3–19.4)
Kappa, lambda light chain ratio: 24.13 — ABNORMAL HIGH (ref 0.26–1.65)
Lambda free light chains: 10.4 mg/L (ref 5.7–26.3)

## 2018-11-25 ENCOUNTER — Encounter: Payer: Self-pay | Admitting: Hematology and Oncology

## 2018-11-25 LAB — MULTIPLE MYELOMA PANEL, SERUM
Albumin SerPl Elph-Mcnc: 3.3 g/dL (ref 2.9–4.4)
Albumin/Glob SerPl: 1.2 (ref 0.7–1.7)
Alpha 1: 0.3 g/dL (ref 0.0–0.4)
Alpha2 Glob SerPl Elph-Mcnc: 0.7 g/dL (ref 0.4–1.0)
B-Globulin SerPl Elph-Mcnc: 0.8 g/dL (ref 0.7–1.3)
Gamma Glob SerPl Elph-Mcnc: 1.3 g/dL (ref 0.4–1.8)
Globulin, Total: 3 g/dL (ref 2.2–3.9)
IgA: 31 mg/dL — ABNORMAL LOW (ref 61–437)
IgG (Immunoglobin G), Serum: 1601 mg/dL (ref 603–1613)
IgM (Immunoglobulin M), Srm: 6 mg/dL — ABNORMAL LOW (ref 15–143)
M Protein SerPl Elph-Mcnc: 1.2 g/dL — ABNORMAL HIGH
Total Protein ELP: 6.3 g/dL (ref 6.0–8.5)

## 2018-11-25 NOTE — Progress Notes (Signed)
[place] OFFICE PROGRESS NOTE  Patient Care Team: Lucianne Lei, MD as PCP - General (Family Medicine) Heath Lark, MD as Consulting Physician (Hematology and Oncology) Beverely Pace, LCSW as Social Worker (General Practice)  ASSESSMENT & PLAN:  Multiple myeloma in relapse [facility]) His recent myeloma panel was stable.  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 We will proceed regardless of creatinine function with daratumumab    No orders of the defined types were placed in this encounter.   INTERVAL HISTORY: Please see below for problem oriented charting. He returns for treatment and follow-up He is compliant taking medications as directed He had no difficulties getting pomalidomide refilled No new bone pain No recent infection, fever or chills  SUMMARY OF ONCOLOGIC HISTORY: Oncology History  Multiple myeloma in relapse ([place])  12/12/2010 Initial Diagnosis   Multiple myeloma   02/16/2014 Imaging   Skeletal survey showed diffuse osteopenia   03/02/2014 Bone Marrow Biopsy   Accession: [id number] BM biopsy showed only 5 % plasma cells. However, the biopsy was difficult and the bone was fragmented during the procedure. Cytogenetics and FISH study is normal   03/03/2014 Procedure   he has port placement   03/10/2014 - 05/19/2014 Chemotherapy   he received elotuzumab and revlimid   05/20/2014 - 02/25/2016 Chemotherapy   Treatment is switched  to maintenance Revlimid only. Treatment is stopped due to progressive disease   11/15/2014 Adverse Reaction   He has mild worsening anemia. Does of Revlimid reduced to 5 mg 21 days on, 7 days off   03/06/2016 -  Chemotherapy   He received Daratumumab and Velcade. Velcade is stopped on 07/24/16   03/06/2016 -  Chemotherapy   The patient had daratumumab (DARZALEX) 1,200 mg in sodium chloride 0.9 % 940 mL (1.2 mg/mL) chemo infusion, 15.3 mg/kg = 1,260 mg, Intravenous, Once, 1 of 1 cycle Administration: 1,200 mg (03/06/2016) daratumumab (DARZALEX) 1,200 mg in sodium chloride 0.9 % 440 mL (2.4 mg/mL) chemo infusion, 15.3 mg/kg = 1,260 mg, Intravenous, Once, 14 of 14 cycles Administration: 1,200 mg (03/13/2016), 1,200 mg (03/20/2016), 1,200 mg (05/01/2016), 1,200 mg (05/22/2016), 1,200 mg (03/27/2016), 1,200 mg (04/03/2016), 1,200 mg (04/10/2016), 1,200 mg (04/17/2016), 1,200 mg (04/24/2016), 1,200 mg (06/12/2016), 1,200 mg (07/03/2016), 1,200 mg (07/24/2016), 1,200 mg (08/14/2016), 1,200 mg (09/04/2016) daratumumab (DARZALEX) 1,200 mg in sodium chloride 0.9 % 440 mL chemo infusion, 15.2 mg/kg = 1,260 mg, Intravenous, Once, 29 of 30 cycles Dose modification: 16 mg/kg (original dose 16 mg/kg, Cycle 40, Reason: Other (see comments), Comment: weight gain) Administration: 1,200 mg (09/25/2016), 1,200 mg (10/16/2016), 1,200 mg (11/13/2016), 1,100 mg (01/09/2017), 1,100 mg (12/12/2016), 1,100 mg (02/06/2017), 1,100 mg (03/06/2017), 1,100 mg (04/03/2017), 1,100 mg (05/05/2017), 1,100 mg (06/05/2017), 1,100 mg (07/03/2017), 1,100 mg (07/31/2017), 1,100 mg (08/28/2017), 1,100 mg (09/29/2017), 1,100 mg (10/27/2017), 1,100 mg (11/24/2017), 1,100 mg (12/22/2017), 1,100 mg (01/19/2018), 1,100 mg (02/16/2018), 1,100 mg (03/16/2018), 1,100 mg (04/13/2018), 1,100 mg (05/11/2018), 1,100 mg (06/08/2018), 1,200 mg (07/06/2018), 1,200 mg (08/03/2018), 1,200 mg (08/31/2018), 1,200 mg (09/28/2018), 1,200 mg (10/26/2018),  1,200 mg (11/23/2018)  for chemotherapy treatment.     08/14/2016 Miscellaneous   Pomalyst is added along with Daratumumab   10/17/2016 Surgery   EGD was performed for coffee-ground emesis. Multiple linear superficial ulcers were noted in the esophagus from 25-35 cm from insertion. Severe esophagitis was noted from 30-35 cm from insertion. Multiple biopsies were obtained. A small hiatal hernia was noted. The gastric cavity contained coffee-ground fluid, after suctioning and lavage, no obvious gastric lesions/erosion/ulceration/erythema was noted. Biopsies were taken from the antrum to rule out H. Pylori. Duodenum bulb appeared unremarkable. A small diverticulum was noted in second portion of the duodenum.     12/06/2016 Imaging   Ct scan abdomen No acute findings.  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  Stable small hiatal hernia.  Colonic diverticulosis, without radiographic evidence of diverticulitis.     REVIEW OF SYSTEMS:   Constitutional: Denies fevers, chills or abnormal weight loss Eyes: Denies blurriness of vision Ears, nose, mouth, throat, and face: Denies mucositis or sore throat Respiratory: Denies cough, dyspnea or wheezes Cardiovascular: Denies palpitation, chest discomfort or lower extremity swelling Gastrointestinal:  Denies nausea, heartburn or change in bowel habits Skin: Denies abnormal skin rashes Lymphatics: Denies new lymphadenopathy or easy bruising Neurological:Denies numbness, tingling or new weaknesses Behavioral/Psych: Mood is stable, no new changes  All other systems were reviewed with the patient and are negative.  I have reviewed the past medical history, past surgical history, social history and family history with the patient and they are unchanged from previous note.  ALLERGIES:  has No Known Allergies.  MEDICATIONS:  Current Outpatient Medications  Medication Sig Dispense Refill  . acyclovir (ZOVIRAX) 400 MG tablet Take 1 tablet (400 mg total) by  mouth daily. 60 tablet 11  . allopurinol (ZYLOPRIM) 100 MG tablet Take 100 mg by mouth 2 (two) times daily.     Marland Kitchen amLODipine (NORVASC) 10 MG tablet Take 10 mg by mouth daily.  1  . aspirin EC 81 MG tablet Take 81 mg by mouth daily.    Marland Kitchen atorvastatin (LIPITOR) 20 MG tablet Take 20 mg by mouth at bedtime.     . brimonidine (ALPHAGAN) 0.2 % ophthalmic solution Place 1 drop into the right eye 3 (three) times daily.     . calcium carbonate (TUMS - DOSED IN MG ELEMENTAL CALCIUM) 500 MG chewable tablet Chew 1 tablet by mouth daily as needed for indigestion or heartburn.    . cholecalciferol (VITAMIN D) 1000 UNITS tablet Take 1,000 Units by mouth daily.    . dorzolamide-timolol (COSOPT) 22.3-6.8 MG/ML ophthalmic solution Place 1 drop into the right eye 2 (two) times daily.     . fluorometholone (FML) 0.1 % ophthalmic suspension Place 1 drop into the right eye daily.     Marland Kitchen latanoprost (XALATAN) 0.005 % ophthalmic solution Place 1 drop into the right eye at bedtime.    . lidocaine-prilocaine (EMLA) cream Apply to affected area once 30 g 3  . ondansetron (ZOFRAN) 4 MG tablet Take 1 tablet (4 mg total) by mouth every 6 (six) hours. 12 tablet 0  . ondansetron (ZOFRAN) 8 MG tablet Take 1 tablet (8 mg total) by mouth 2 (two) times daily as needed (Nausea or vomiting). 30 tablet 1  . pantoprazole (PROTONIX) 40 MG tablet Take 1 tablet (40 mg total) by mouth daily. (Patient not taking: Reported on 03/19/2018) 180 tablet 3  . pomalidomide (POMALYST) 3 MG capsule Take 1 capsule (3 mg total) by mouth daily.  Take with water on days 1-21. Repeat every 28 days. 21 capsule 11   No current facility-administered medications for this visit.     PHYSICAL EXAMINATION: ECOG PERFORMANCE STATUS: 1 - Symptomatic but completely ambulatory  Vitals:   11/23/18 1252  BP: (!) 155/75  Pulse: 77  Resp: 18  Temp: 98.5 F (36.9 C)  SpO2: 100%   Filed Weights   11/23/18 1252  Weight: 176 lb 1.6 oz (79.9 kg)     GENERAL:alert, no distress and comfortable SKIN: skin color, texture, turgor are normal, no rashes or significant lesions EYES: normal, Conjunctiva are pink and non-injected, sclera clear OROPHARYNX:no exudate, no erythema and lips, buccal mucosa, and tongue normal  NECK: supple, thyroid normal size, non-tender, without nodularity LYMPH:  no palpable lymphadenopathy in the cervical, axillary or inguinal LUNGS: clear to auscultation and percussion with normal breathing effort HEART: regular rate & rhythm and no murmurs and no lower extremity edema ABDOMEN:abdomen soft, non-tender and normal bowel sounds Musculoskeletal:no cyanosis of digits and no clubbing  NEURO: alert & oriented x 3 with fluent speech, no focal motor/sensory deficits  LABORATORY DATA:  I have reviewed the data as listed    Component Value Date/Time   NA 139 11/23/2018 1228   NA 140 01/09/2017 0913   K 3.6 11/23/2018 1228   K 3.7 01/09/2017 0913   CL 110 11/23/2018 1228   CL 107 06/29/2012 1131   CO2 20 (L) 11/23/2018 1228   CO2 21 (L) 01/09/2017 0913   GLUCOSE 94 11/23/2018 1228   GLUCOSE 80 01/09/2017 0913   GLUCOSE 143 (H) 06/29/2012 1131   BUN 14 11/23/2018 1228   BUN 9.8 01/09/2017 0913   CREATININE 1.65 (H) 11/23/2018 1228   CREATININE 1.85 (H) 07/06/2018 0935   CREATININE 1.5 (H) 01/09/2017 0913   CALCIUM 7.5 (L) 11/23/2018 1228   CALCIUM 7.5 (L) 01/09/2017 0913   PROT 6.5 11/23/2018 1228   PROT 5.7 (L) 01/09/2017 0913   ALBUMIN 3.3 (L) 11/23/2018 1228   ALBUMIN 2.9 (L) 01/09/2017 0913   AST 12 (L) 11/23/2018 1228   AST 14 (L) 07/06/2018 0935   AST 12 01/09/2017 0913   ALT 10 11/23/2018 1228   ALT 11 07/06/2018 0935   ALT 10 01/09/2017 0913   ALKPHOS 55 11/23/2018 1228   ALKPHOS 55 01/09/2017 0913   BILITOT 0.7 11/23/2018 1228   BILITOT 0.5 07/06/2018 0935   BILITOT 0.44 01/09/2017 0913   GFRNONAA 40 (L) 11/23/2018 1228   GFRNONAA 35 (L) 07/06/2018 0935   GFRAA 46 (L) 11/23/2018 1228    GFRAA 40 (L) 07/06/2018 0935    No results found for: SPEP, UPEP  Lab Results  Component Value Date   WBC 7.5 11/23/2018   NEUTROABS 2.4 11/23/2018   HGB 8.8 (L) 11/23/2018   HCT 26.9 (L) 11/23/2018   MCV 98.5 11/23/2018   PLT 223 11/23/2018      Chemistry      Component Value Date/Time   NA 139 11/23/2018 1228   NA 140 01/09/2017 0913   K 3.6 11/23/2018 1228   K 3.7 01/09/2017 0913   CL 110 11/23/2018 1228   CL 107 06/29/2012 1131   CO2 20 (L) 11/23/2018 1228   CO2 21 (L) 01/09/2017 0913   BUN 14 11/23/2018 1228   BUN 9.8 01/09/2017 0913   CREATININE 1.65 (H) 11/23/2018 1228   CREATININE 1.85 (H) 07/06/2018 0935   CREATININE 1.5 (H) 01/09/2017 0913      Component Value Date/Time  CALCIUM 7.5 (L) 11/23/2018 1228   CALCIUM 7.5 (L) 01/09/2017 0913   ALKPHOS 55 11/23/2018 1228   ALKPHOS 55 01/09/2017 0913   AST 12 (L) 11/23/2018 1228   AST 14 (L) 07/06/2018 0935   AST 12 01/09/2017 0913   ALT 10 11/23/2018 1228   ALT 11 07/06/2018 0935   ALT 10 01/09/2017 0913   BILITOT 0.7 11/23/2018 1228   BILITOT 0.5 07/06/2018 0935   BILITOT 0.44 01/09/2017 0913    All questions were answered. The patient knows to call the clinic with any problems, questions or concerns. No barriers to learning was detected.  I spent 15 minutes counseling the patient face to face. The total time spent in the appointment was 20 minutes and more than 50% was on counseling and review of test results  Heath Lark, MD 11/25/2018 8:28 AM

## 2018-11-25 NOTE — Assessment & Plan Note (Signed)
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 

## 2018-11-25 NOTE — Assessment & Plan Note (Signed)
I recommend increase oral fluid intake as tolerated We will monitor his kidney function carefully. We will proceed regardless of creatinine function with daratumumab 

## 2018-11-25 NOTE — Assessment & Plan Note (Signed)
His recent myeloma panel was stable.  Repeat myeloma panel from today is pending; the panel from 4 weeks ago showed stable disease control We will continue pomalidomide along with monthly daratumumab He will continue acyclovir for antimicrobial prophylaxis He will continue calcium with vitamin D along with Zometa 

## 2018-12-21 ENCOUNTER — Inpatient Hospital Stay: Payer: Medicare Other

## 2018-12-21 ENCOUNTER — Inpatient Hospital Stay: Payer: Medicare Other | Attending: Hematology and Oncology

## 2018-12-21 ENCOUNTER — Other Ambulatory Visit: Payer: Self-pay

## 2018-12-21 ENCOUNTER — Inpatient Hospital Stay (HOSPITAL_BASED_OUTPATIENT_CLINIC_OR_DEPARTMENT_OTHER): Payer: Medicare Other | Admitting: Hematology and Oncology

## 2018-12-21 ENCOUNTER — Encounter: Payer: Self-pay | Admitting: Hematology and Oncology

## 2018-12-21 VITALS — BP 139/69 | HR 59 | Temp 97.7°F | Resp 16

## 2018-12-21 DIAGNOSIS — Z79899 Other long term (current) drug therapy: Secondary | ICD-10-CM | POA: Insufficient documentation

## 2018-12-21 DIAGNOSIS — Z9221 Personal history of antineoplastic chemotherapy: Secondary | ICD-10-CM | POA: Diagnosis not present

## 2018-12-21 DIAGNOSIS — Z5112 Encounter for antineoplastic immunotherapy: Secondary | ICD-10-CM | POA: Diagnosis not present

## 2018-12-21 DIAGNOSIS — D631 Anemia in chronic kidney disease: Secondary | ICD-10-CM | POA: Insufficient documentation

## 2018-12-21 DIAGNOSIS — N1831 Chronic kidney disease, stage 3a: Secondary | ICD-10-CM | POA: Diagnosis not present

## 2018-12-21 DIAGNOSIS — K449 Diaphragmatic hernia without obstruction or gangrene: Secondary | ICD-10-CM | POA: Diagnosis not present

## 2018-12-21 DIAGNOSIS — E860 Dehydration: Secondary | ICD-10-CM | POA: Diagnosis not present

## 2018-12-21 DIAGNOSIS — C9002 Multiple myeloma in relapse: Secondary | ICD-10-CM

## 2018-12-21 DIAGNOSIS — N183 Chronic kidney disease, stage 3 unspecified: Secondary | ICD-10-CM | POA: Insufficient documentation

## 2018-12-21 DIAGNOSIS — M8580 Other specified disorders of bone density and structure, unspecified site: Secondary | ICD-10-CM | POA: Diagnosis not present

## 2018-12-21 DIAGNOSIS — Z95828 Presence of other vascular implants and grafts: Secondary | ICD-10-CM

## 2018-12-21 LAB — CBC WITH DIFFERENTIAL/PLATELET
Abs Immature Granulocytes: 0.05 10*3/uL (ref 0.00–0.07)
Basophils Absolute: 0.2 10*3/uL — ABNORMAL HIGH (ref 0.0–0.1)
Basophils Relative: 2 %
Eosinophils Absolute: 0.2 10*3/uL (ref 0.0–0.5)
Eosinophils Relative: 3 %
HCT: 28.1 % — ABNORMAL LOW (ref 39.0–52.0)
Hemoglobin: 9.2 g/dL — ABNORMAL LOW (ref 13.0–17.0)
Immature Granulocytes: 1 %
Lymphocytes Relative: 51 %
Lymphs Abs: 3.8 10*3/uL (ref 0.7–4.0)
MCH: 31.9 pg (ref 26.0–34.0)
MCHC: 32.7 g/dL (ref 30.0–36.0)
MCV: 97.6 fL (ref 80.0–100.0)
Monocytes Absolute: 0.6 10*3/uL (ref 0.1–1.0)
Monocytes Relative: 8 %
Neutro Abs: 2.5 10*3/uL (ref 1.7–7.7)
Neutrophils Relative %: 35 %
Platelets: 238 10*3/uL (ref 150–400)
RBC: 2.88 MIL/uL — ABNORMAL LOW (ref 4.22–5.81)
RDW: 16.2 % — ABNORMAL HIGH (ref 11.5–15.5)
WBC: 7.3 10*3/uL (ref 4.0–10.5)
nRBC: 0 % (ref 0.0–0.2)

## 2018-12-21 LAB — COMPREHENSIVE METABOLIC PANEL
ALT: 11 U/L (ref 0–44)
AST: 14 U/L — ABNORMAL LOW (ref 15–41)
Albumin: 3.5 g/dL (ref 3.5–5.0)
Alkaline Phosphatase: 55 U/L (ref 38–126)
Anion gap: 7 (ref 5–15)
BUN: 17 mg/dL (ref 8–23)
CO2: 23 mmol/L (ref 22–32)
Calcium: 8.2 mg/dL — ABNORMAL LOW (ref 8.9–10.3)
Chloride: 110 mmol/L (ref 98–111)
Creatinine, Ser: 1.81 mg/dL — ABNORMAL HIGH (ref 0.61–1.24)
GFR calc Af Amer: 41 mL/min — ABNORMAL LOW (ref >60)
GFR calc non Af Amer: 36 mL/min — ABNORMAL LOW (ref >60)
Glucose, Bld: 84 mg/dL (ref 70–99)
Potassium: 4.4 mmol/L (ref 3.5–5.1)
Sodium: 140 mmol/L (ref 135–145)
Total Bilirubin: 0.6 mg/dL (ref 0.3–1.2)
Total Protein: 6.9 g/dL (ref 6.5–8.1)

## 2018-12-21 MED ORDER — SODIUM CHLORIDE 0.9% FLUSH
10.0000 mL | INTRAVENOUS | Status: DC | PRN
  Administered 2018-12-21: 10 mL
  Filled 2018-12-21: qty 10

## 2018-12-21 MED ORDER — DIPHENHYDRAMINE HCL 25 MG PO CAPS
50.0000 mg | ORAL_CAPSULE | Freq: Once | ORAL | Status: AC
  Administered 2018-12-21: 13:00:00 50 mg via ORAL

## 2018-12-21 MED ORDER — METHYLPREDNISOLONE SODIUM SUCC 40 MG IJ SOLR
INTRAMUSCULAR | Status: AC
  Filled 2018-12-21: qty 1

## 2018-12-21 MED ORDER — SODIUM CHLORIDE 0.9 % IV SOLN
15.4000 mg/kg | Freq: Once | INTRAVENOUS | Status: AC
  Administered 2018-12-21: 1200 mg via INTRAVENOUS
  Filled 2018-12-21: qty 60

## 2018-12-21 MED ORDER — PROCHLORPERAZINE MALEATE 10 MG PO TABS
10.0000 mg | ORAL_TABLET | Freq: Once | ORAL | Status: AC
  Administered 2018-12-21: 10 mg via ORAL

## 2018-12-21 MED ORDER — ACETAMINOPHEN 325 MG PO TABS
ORAL_TABLET | ORAL | Status: AC
  Filled 2018-12-21: qty 2

## 2018-12-21 MED ORDER — HEPARIN SOD (PORK) LOCK FLUSH 100 UNIT/ML IV SOLN
500.0000 [IU] | Freq: Once | INTRAVENOUS | Status: AC | PRN
  Administered 2018-12-21: 500 [IU]
  Filled 2018-12-21: qty 5

## 2018-12-21 MED ORDER — DIPHENHYDRAMINE HCL 25 MG PO CAPS
ORAL_CAPSULE | ORAL | Status: AC
  Filled 2018-12-21: qty 2

## 2018-12-21 MED ORDER — SODIUM CHLORIDE 0.9 % IV SOLN
Freq: Once | INTRAVENOUS | Status: AC
  Administered 2018-12-21: 13:00:00 via INTRAVENOUS
  Filled 2018-12-21: qty 250

## 2018-12-21 MED ORDER — PROCHLORPERAZINE MALEATE 10 MG PO TABS
ORAL_TABLET | ORAL | Status: AC
  Filled 2018-12-21: qty 1

## 2018-12-21 MED ORDER — SODIUM CHLORIDE 0.9% FLUSH
10.0000 mL | Freq: Once | INTRAVENOUS | Status: AC
  Administered 2018-12-21: 10 mL
  Filled 2018-12-21: qty 10

## 2018-12-21 MED ORDER — ACETAMINOPHEN 325 MG PO TABS
650.0000 mg | ORAL_TABLET | Freq: Once | ORAL | Status: AC
  Administered 2018-12-21: 650 mg via ORAL

## 2018-12-21 MED ORDER — METHYLPREDNISOLONE SODIUM SUCC 40 MG IJ SOLR
40.0000 mg | Freq: Once | INTRAMUSCULAR | Status: AC
  Administered 2018-12-21: 40 mg via INTRAVENOUS

## 2018-12-21 NOTE — Progress Notes (Signed)
[place] OFFICE PROGRESS NOTE  Patient Care Team: Lucianne Lei, MD as PCP - General (Family Medicine) Heath Lark, MD as Consulting Physician (Hematology and Oncology) Beverely Pace, LCSW as Social Worker (General Practice)  ASSESSMENT & PLAN:  Multiple myeloma in relapse [facility]) His recent myeloma panel was stable.  Repeat myeloma panel from today is pending; the panel from 4 weeks ago showed stable disease control He has widely fluctuating serum light chain due to his chronic kidney disease He also have occasional elevated M protein due to dehydration We will continue pomalidomide along with monthly daratumumab He will continue acyclovir for antimicrobial prophylaxis He will continue calcium with vitamin D along with Zometa  Anemia in chronic renal disease He has multifactorial anemia due to treatment and chronic kidney disease He is not symptomatic Observe only  Chronic renal insufficiency, stage III (moderate) I recommend increase oral fluid intake as tolerated We will monitor his kidney function carefully. We will proceed regardless of creatinine function with daratumumab    No orders of the defined types were placed in this encounter.   INTERVAL HISTORY: Please see below for problem oriented charting. He returns for treatment and follow-up He stated he has been compliant taking Pomalyst as prescribed No recent infection, fever or chills He denies recent bone pain The patient denies any recent signs or symptoms of bleeding such as spontaneous epistaxis, hematuria or hematochezia.   SUMMARY OF ONCOLOGIC HISTORY: Oncology History  Multiple myeloma in relapse ([place])  12/12/2010 Initial Diagnosis   Multiple myeloma   02/16/2014 Imaging   Skeletal survey showed diffuse osteopenia   03/02/2014 Bone Marrow Biopsy   Accession: [id number] BM biopsy showed only 5 % plasma cells. However, the biopsy was difficult and the bone was fragmented during the  procedure. Cytogenetics and FISH study is normal   03/03/2014 Procedure   he has port placement   03/10/2014 - 05/19/2014 Chemotherapy   he received elotuzumab and revlimid   05/20/2014 - 02/25/2016 Chemotherapy   Treatment is switched to maintenance Revlimid only. Treatment is stopped due to progressive disease   11/15/2014 Adverse Reaction   He has mild worsening anemia. Does of Revlimid reduced to 5 mg 21 days on, 7 days off   03/06/2016 -  Chemotherapy   He received Daratumumab and Velcade. Velcade is stopped on 07/24/16   03/06/2016 -  Chemotherapy   The patient had daratumumab (DARZALEX) 1,200 mg in sodium chloride 0.9 % 940 mL (1.2 mg/mL) chemo infusion, 15.3 mg/kg = 1,260 mg, Intravenous, Once, 1 of 1 cycle Administration: 1,200 mg (03/06/2016) daratumumab (DARZALEX) 1,200 mg in sodium chloride 0.9 % 440 mL (2.4 mg/mL) chemo infusion, 15.3 mg/kg = 1,260 mg, Intravenous, Once, 14 of 14 cycles Administration: 1,200 mg (03/13/2016), 1,200 mg (03/20/2016), 1,200 mg (05/01/2016), 1,200 mg (05/22/2016), 1,200 mg (03/27/2016), 1,200 mg (04/03/2016), 1,200 mg (04/10/2016), 1,200 mg (04/17/2016), 1,200 mg (04/24/2016), 1,200 mg (06/12/2016), 1,200 mg (07/03/2016), 1,200 mg (07/24/2016), 1,200 mg (08/14/2016), 1,200 mg (09/04/2016) daratumumab (DARZALEX) 1,200 mg in sodium chloride 0.9 % 440 mL chemo infusion, 15.2 mg/kg = 1,260 mg, Intravenous, Once, 29 of 30 cycles Dose modification: 16 mg/kg (original dose 16 mg/kg, Cycle 40, Reason: Other (see comments), Comment: weight gain) Administration: 1,200 mg (09/25/2016), 1,200 mg (10/16/2016), 1,200 mg (11/13/2016), 1,100 mg (01/09/2017), 1,100 mg (12/12/2016), 1,100 mg (02/06/2017), 1,100 mg (03/06/2017), 1,100 mg (04/03/2017), 1,100 mg (05/05/2017), 1,100 mg (06/05/2017), 1,100 mg (07/03/2017), 1,100 mg (07/31/2017), 1,100 mg (08/28/2017), 1,100 mg (09/29/2017), 1,100 mg (10/27/2017), 1,100  mg (11/24/2017), 1,100 mg (12/22/2017), 1,100 mg (01/19/2018), 1,100 mg (02/16/2018), 1,100 mg  (03/16/2018), 1,100 mg (04/13/2018), 1,100 mg (05/11/2018), 1,100 mg (06/08/2018), 1,200 mg (07/06/2018), 1,200 mg (08/03/2018), 1,200 mg (08/31/2018), 1,200 mg (09/28/2018), 1,200 mg (10/26/2018), 1,200 mg (11/23/2018)  for chemotherapy treatment.    08/14/2016 Miscellaneous   Pomalyst is added along with Daratumumab   10/17/2016 Surgery   EGD was performed for coffee-ground emesis. Multiple linear superficial ulcers were noted in the esophagus from 25-35 cm from insertion. Severe esophagitis was noted from 30-35 cm from insertion. Multiple biopsies were obtained. A small hiatal hernia was noted. The gastric cavity contained coffee-ground fluid, after suctioning and lavage, no obvious gastric lesions/erosion/ulceration/erythema was noted. Biopsies were taken from the antrum to rule out H. Pylori. Duodenum bulb appeared unremarkable. A small diverticulum was noted in second portion of the duodenum.     12/06/2016 Imaging   Ct scan abdomen No acute findings.  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  Stable small hiatal hernia.  Colonic diverticulosis, without radiographic evidence of diverticulitis.     REVIEW OF SYSTEMS:   Constitutional: Denies fevers, chills or abnormal weight loss Eyes: Denies blurriness of vision Ears, nose, mouth, throat, and face: Denies mucositis or sore throat Respiratory: Denies cough, dyspnea or wheezes Cardiovascular: Denies palpitation, chest discomfort or lower extremity swelling Gastrointestinal:  Denies nausea, heartburn or change in bowel habits Skin: Denies abnormal skin rashes Lymphatics: Denies new lymphadenopathy or easy bruising Neurological:Denies numbness, tingling or new weaknesses Behavioral/Psych: Mood is stable, no new changes  All other systems were reviewed with the patient and are negative.  I have reviewed the past medical history, past surgical history, social history and family history with the  patient and they are unchanged from previous note.  ALLERGIES:  has No Known Allergies.  MEDICATIONS:  Current Outpatient Medications  Medication Sig Dispense Refill  . acyclovir (ZOVIRAX) 400 MG tablet Take 1 tablet (400 mg total) by mouth daily. 60 tablet 11  . allopurinol (ZYLOPRIM) 100 MG tablet Take 100 mg by mouth 2 (two) times daily.     Marland Kitchen amLODipine (NORVASC) 10 MG tablet Take 10 mg by mouth daily.  1  . aspirin EC 81 MG tablet Take 81 mg by mouth daily.    Marland Kitchen atorvastatin (LIPITOR) 20 MG tablet Take 20 mg by mouth at bedtime.     . brimonidine (ALPHAGAN) 0.2 % ophthalmic solution Place 1 drop into the right eye 3 (three) times daily.     . calcium carbonate (TUMS - DOSED IN MG ELEMENTAL CALCIUM) 500 MG chewable tablet Chew 1 tablet by mouth daily as needed for indigestion or heartburn.    . cholecalciferol (VITAMIN D) 1000 UNITS tablet Take 1,000 Units by mouth daily.    . dorzolamide-timolol (COSOPT) 22.3-6.8 MG/ML ophthalmic solution Place 1 drop into the right eye 2 (two) times daily.     . fluorometholone (FML) 0.1 % ophthalmic suspension Place 1 drop into the right eye daily.     Marland Kitchen latanoprost (XALATAN) 0.005 % ophthalmic solution Place 1 drop into the right eye at bedtime.    . lidocaine-prilocaine (EMLA) cream Apply to affected area once 30 g 3  . ondansetron (ZOFRAN) 4 MG tablet Take 1 tablet (4 mg total) by mouth every 6 (six) hours. 12 tablet 0  . ondansetron (ZOFRAN) 8 MG tablet Take 1 tablet (8 mg total) by mouth 2 (two) times daily as needed (Nausea or vomiting). 30 tablet 1  . pantoprazole (  PROTONIX) 40 MG tablet Take 1 tablet (40 mg total) by mouth daily. (Patient not taking: Reported on 03/19/2018) 180 tablet 3  . pomalidomide (POMALYST) 3 MG capsule Take 1 capsule (3 mg total) by mouth daily. Take with water on days 1-21. Repeat every 28 days. 21 capsule 11   No current facility-administered medications for this visit.    PHYSICAL EXAMINATION: ECOG PERFORMANCE  STATUS: 1 - Symptomatic but completely ambulatory  Vitals:   12/21/18 1120  BP: (!) 149/59  Pulse: 69  Resp: 18  Temp: 98.2 F (36.8 C)  SpO2: 100%   Filed Weights   12/21/18 1120  Weight: 174 lb 12.8 oz (79.3 kg)    GENERAL:alert, no distress and comfortable SKIN: skin color, texture, turgor are normal, no rashes or significant lesions EYES: normal, Conjunctiva are pink and non-injected, sclera clear OROPHARYNX:no exudate, no erythema and lips, buccal mucosa, and tongue normal  NECK: supple, thyroid normal size, non-tender, without nodularity LYMPH:  no palpable lymphadenopathy in the cervical, axillary or inguinal LUNGS: clear to auscultation and percussion with normal breathing effort HEART: regular rate & rhythm and no murmurs and no lower extremity edema ABDOMEN:abdomen soft, non-tender and normal bowel sounds Musculoskeletal:no cyanosis of digits and no clubbing  NEURO: alert & oriented x 3 with fluent speech, no focal motor/sensory deficits  LABORATORY DATA:  I have reviewed the data as listed    Component Value Date/Time   NA 140 12/21/2018 1015   NA 140 01/09/2017 0913   K 4.4 12/21/2018 1015   K 3.7 01/09/2017 0913   CL 110 12/21/2018 1015   CL 107 06/29/2012 1131   CO2 23 12/21/2018 1015   CO2 21 (L) 01/09/2017 0913   GLUCOSE 84 12/21/2018 1015   GLUCOSE 80 01/09/2017 0913   GLUCOSE 143 (H) 06/29/2012 1131   BUN 17 12/21/2018 1015   BUN 9.8 01/09/2017 0913   CREATININE 1.81 (H) 12/21/2018 1015   CREATININE 1.85 (H) 07/06/2018 0935   CREATININE 1.5 (H) 01/09/2017 0913   CALCIUM 8.2 (L) 12/21/2018 1015   CALCIUM 7.5 (L) 01/09/2017 0913   PROT 6.9 12/21/2018 1015   PROT 5.7 (L) 01/09/2017 0913   ALBUMIN 3.5 12/21/2018 1015   ALBUMIN 2.9 (L) 01/09/2017 0913   AST 14 (L) 12/21/2018 1015   AST 14 (L) 07/06/2018 0935   AST 12 01/09/2017 0913   ALT 11 12/21/2018 1015   ALT 11 07/06/2018 0935   ALT 10 01/09/2017 0913   ALKPHOS 55 12/21/2018 1015    ALKPHOS 55 01/09/2017 0913   BILITOT 0.6 12/21/2018 1015   BILITOT 0.5 07/06/2018 0935   BILITOT 0.44 01/09/2017 0913   GFRNONAA 36 (L) 12/21/2018 1015   GFRNONAA 35 (L) 07/06/2018 0935   GFRAA 41 (L) 12/21/2018 1015   GFRAA 40 (L) 07/06/2018 0935    No results found for: SPEP, UPEP  Lab Results  Component Value Date   WBC 7.3 12/21/2018   NEUTROABS 2.5 12/21/2018   HGB 9.2 (L) 12/21/2018   HCT 28.1 (L) 12/21/2018   MCV 97.6 12/21/2018   PLT 238 12/21/2018      Chemistry      Component Value Date/Time   NA 140 12/21/2018 1015   NA 140 01/09/2017 0913   K 4.4 12/21/2018 1015   K 3.7 01/09/2017 0913   CL 110 12/21/2018 1015   CL 107 06/29/2012 1131   CO2 23 12/21/2018 1015   CO2 21 (L) 01/09/2017 0913   BUN 17 12/21/2018 1015  BUN 9.8 01/09/2017 0913   CREATININE 1.81 (H) 12/21/2018 1015   CREATININE 1.85 (H) 07/06/2018 0935   CREATININE 1.5 (H) 01/09/2017 0913      Component Value Date/Time   CALCIUM 8.2 (L) 12/21/2018 1015   CALCIUM 7.5 (L) 01/09/2017 0913   ALKPHOS 55 12/21/2018 1015   ALKPHOS 55 01/09/2017 0913   AST 14 (L) 12/21/2018 1015   AST 14 (L) 07/06/2018 0935   AST 12 01/09/2017 0913   ALT 11 12/21/2018 1015   ALT 11 07/06/2018 0935   ALT 10 01/09/2017 0913   BILITOT 0.6 12/21/2018 1015   BILITOT 0.5 07/06/2018 0935   BILITOT 0.44 01/09/2017 0913     All questions were answered. The patient knows to call the clinic with any problems, questions or concerns. No barriers to learning was detected.  I spent 15 minutes counseling the patient face to face. The total time spent in the appointment was 20 minutes and more than 50% was on counseling and review of test results  Heath Lark, MD 12/21/2018 12:28 PM

## 2018-12-21 NOTE — Assessment & Plan Note (Signed)
I recommend increase oral fluid intake as tolerated We will monitor his kidney function carefully. We will proceed regardless of creatinine function with daratumumab

## 2018-12-21 NOTE — Assessment & Plan Note (Signed)
His recent myeloma panel was stable.  Repeat myeloma panel from today is pending; the panel from 4 weeks ago showed stable disease control He has widely fluctuating serum light chain due to his chronic kidney disease He also have occasional elevated M protein due to dehydration We will continue pomalidomide along with monthly daratumumab He will continue acyclovir for antimicrobial prophylaxis He will continue calcium with vitamin D along with Zometa

## 2018-12-21 NOTE — Patient Instructions (Signed)
[facility] Discharge Instructions for Patients Receiving Chemotherapy  Today you received the following chemotherapy agents :  Daratumumab.  To help prevent nausea and vomiting after your treatment, we encourage you to take your nausea medication as prescribed.   If you develop nausea and vomiting that is not controlled by your nausea medication, call the clinic.   BELOW ARE SYMPTOMS THAT SHOULD BE REPORTED IMMEDIATELY:  *FEVER GREATER THAN 100.5 F  *CHILLS WITH OR WITHOUT FEVER  NAUSEA AND VOMITING THAT IS NOT CONTROLLED WITH YOUR NAUSEA MEDICATION  *UNUSUAL SHORTNESS OF BREATH  *UNUSUAL BRUISING OR BLEEDING  TENDERNESS IN MOUTH AND THROAT WITH OR WITHOUT PRESENCE OF ULCERS  *URINARY PROBLEMS  *BOWEL PROBLEMS  UNUSUAL RASH Items with * indicate a potential emergency and should be followed up as soon as possible.  Feel free to call the clinic should you have any questions or concerns. The clinic phone number is [phone].  Please show the CHEMO ALERT CARD at check-in to the Emergency Department and triage nurse.   

## 2018-12-21 NOTE — Assessment & Plan Note (Signed)
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

## 2018-12-22 ENCOUNTER — Other Ambulatory Visit: Payer: Self-pay

## 2018-12-22 DIAGNOSIS — C9002 Multiple myeloma in relapse: Secondary | ICD-10-CM

## 2018-12-22 LAB — KAPPA/LAMBDA LIGHT CHAINS
Kappa free light chain: 266.5 mg/L — ABNORMAL HIGH (ref 3.3–19.4)
Kappa, lambda light chain ratio: 28.66 — ABNORMAL HIGH (ref 0.26–1.65)
Lambda free light chains: 9.3 mg/L (ref 5.7–26.3)

## 2018-12-22 MED ORDER — POMALIDOMIDE 3 MG PO CAPS: 3.0000 mg | ORAL_CAPSULE | Freq: Every day | ORAL | 0 refills | Status: DC

## 2018-12-24 ENCOUNTER — Telehealth: Payer: Self-pay

## 2018-12-24 ENCOUNTER — Telehealth: Payer: Self-pay | Admitting: Hematology and Oncology

## 2018-12-24 LAB — MULTIPLE MYELOMA PANEL, SERUM
Albumin SerPl Elph-Mcnc: 3.6 g/dL (ref 2.9–4.4)
Albumin/Glob SerPl: 1.3 (ref 0.7–1.7)
Alpha 1: 0.2 g/dL (ref 0.0–0.4)
Alpha2 Glob SerPl Elph-Mcnc: 0.6 g/dL (ref 0.4–1.0)
B-Globulin SerPl Elph-Mcnc: 0.7 g/dL (ref 0.7–1.3)
Gamma Glob SerPl Elph-Mcnc: 1.3 g/dL (ref 0.4–1.8)
Globulin, Total: 2.9 g/dL (ref 2.2–3.9)
IgA: 30 mg/dL — ABNORMAL LOW (ref 61–437)
IgG (Immunoglobin G), Serum: 1667 mg/dL — ABNORMAL HIGH (ref 603–1613)
IgM (Immunoglobulin M), Srm: 5 mg/dL — ABNORMAL LOW (ref 15–143)
M Protein SerPl Elph-Mcnc: 1.2 g/dL — ABNORMAL HIGH
Total Protein ELP: 6.5 g/dL (ref 6.0–8.5)

## 2018-12-24 NOTE — Telephone Encounter (Signed)
Wife called and left a message to call her.  Called back. She wanted to verify appt date and times. Given up coming appt times/date. She verbalized understanding.

## 2018-12-24 NOTE — Telephone Encounter (Signed)
Returned patient's phone call regarding scheduling an appointment, left a voicemail. 

## 2018-12-24 NOTE — Telephone Encounter (Signed)
Scheduled appt per 12/14 sch message - unable to reach pt . Left message with appt date and time   

## 2019-01-13 DIAGNOSIS — I10 Essential (primary) hypertension: Secondary | ICD-10-CM | POA: Diagnosis not present

## 2019-01-13 DIAGNOSIS — C9001 Multiple myeloma in remission: Secondary | ICD-10-CM | POA: Diagnosis not present

## 2019-01-13 DIAGNOSIS — R799 Abnormal finding of blood chemistry, unspecified: Secondary | ICD-10-CM | POA: Diagnosis not present

## 2019-01-13 DIAGNOSIS — E039 Hypothyroidism, unspecified: Secondary | ICD-10-CM | POA: Diagnosis not present

## 2019-01-13 DIAGNOSIS — E782 Mixed hyperlipidemia: Secondary | ICD-10-CM | POA: Diagnosis not present

## 2019-01-13 DIAGNOSIS — R609 Edema, unspecified: Secondary | ICD-10-CM | POA: Diagnosis not present

## 2019-01-18 ENCOUNTER — Inpatient Hospital Stay: Payer: Medicare Other | Admitting: Hematology and Oncology

## 2019-01-18 ENCOUNTER — Inpatient Hospital Stay: Payer: Medicare Other | Attending: Hematology and Oncology

## 2019-01-18 ENCOUNTER — Inpatient Hospital Stay: Payer: Medicare Other

## 2019-01-18 ENCOUNTER — Other Ambulatory Visit: Payer: Self-pay

## 2019-01-18 ENCOUNTER — Other Ambulatory Visit: Payer: Self-pay | Admitting: Hematology and Oncology

## 2019-01-18 VITALS — BP 125/42 | HR 62 | Temp 97.9°F | Resp 18

## 2019-01-18 DIAGNOSIS — N1831 Chronic kidney disease, stage 3a: Secondary | ICD-10-CM

## 2019-01-18 DIAGNOSIS — K449 Diaphragmatic hernia without obstruction or gangrene: Secondary | ICD-10-CM | POA: Diagnosis not present

## 2019-01-18 DIAGNOSIS — C9002 Multiple myeloma in relapse: Secondary | ICD-10-CM | POA: Insufficient documentation

## 2019-01-18 DIAGNOSIS — M8580 Other specified disorders of bone density and structure, unspecified site: Secondary | ICD-10-CM | POA: Insufficient documentation

## 2019-01-18 DIAGNOSIS — Z79899 Other long term (current) drug therapy: Secondary | ICD-10-CM | POA: Diagnosis not present

## 2019-01-18 DIAGNOSIS — N183 Chronic kidney disease, stage 3 unspecified: Secondary | ICD-10-CM | POA: Insufficient documentation

## 2019-01-18 DIAGNOSIS — L853 Xerosis cutis: Secondary | ICD-10-CM

## 2019-01-18 DIAGNOSIS — Z9221 Personal history of antineoplastic chemotherapy: Secondary | ICD-10-CM | POA: Insufficient documentation

## 2019-01-18 DIAGNOSIS — Z5112 Encounter for antineoplastic immunotherapy: Secondary | ICD-10-CM | POA: Diagnosis not present

## 2019-01-18 DIAGNOSIS — Z923 Personal history of irradiation: Secondary | ICD-10-CM | POA: Diagnosis not present

## 2019-01-18 DIAGNOSIS — D631 Anemia in chronic kidney disease: Secondary | ICD-10-CM | POA: Diagnosis not present

## 2019-01-18 DIAGNOSIS — Z95828 Presence of other vascular implants and grafts: Secondary | ICD-10-CM

## 2019-01-18 LAB — CBC WITH DIFFERENTIAL/PLATELET
Abs Immature Granulocytes: 0.07 10*3/uL (ref 0.00–0.07)
Basophils Absolute: 0.2 10*3/uL — ABNORMAL HIGH (ref 0.0–0.1)
Basophils Relative: 2 %
Eosinophils Absolute: 0.2 10*3/uL (ref 0.0–0.5)
Eosinophils Relative: 2 %
HCT: 27.6 % — ABNORMAL LOW (ref 39.0–52.0)
Hemoglobin: 9.2 g/dL — ABNORMAL LOW (ref 13.0–17.0)
Immature Granulocytes: 1 %
Lymphocytes Relative: 48 %
Lymphs Abs: 4.5 10*3/uL — ABNORMAL HIGH (ref 0.7–4.0)
MCH: 31.6 pg (ref 26.0–34.0)
MCHC: 33.3 g/dL (ref 30.0–36.0)
MCV: 94.8 fL (ref 80.0–100.0)
Monocytes Absolute: 0.8 10*3/uL (ref 0.1–1.0)
Monocytes Relative: 8 %
Neutro Abs: 3.6 10*3/uL (ref 1.7–7.7)
Neutrophils Relative %: 39 %
Platelets: 257 10*3/uL (ref 150–400)
RBC: 2.91 MIL/uL — ABNORMAL LOW (ref 4.22–5.81)
RDW: 16.2 % — ABNORMAL HIGH (ref 11.5–15.5)
WBC: 9.3 10*3/uL (ref 4.0–10.5)
nRBC: 0 % (ref 0.0–0.2)

## 2019-01-18 LAB — COMPREHENSIVE METABOLIC PANEL
ALT: 9 U/L (ref 0–44)
AST: 11 U/L — ABNORMAL LOW (ref 15–41)
Albumin: 3.5 g/dL (ref 3.5–5.0)
Alkaline Phosphatase: 59 U/L (ref 38–126)
Anion gap: 8 (ref 5–15)
BUN: 13 mg/dL (ref 8–23)
CO2: 20 mmol/L — ABNORMAL LOW (ref 22–32)
Calcium: 8.5 mg/dL — ABNORMAL LOW (ref 8.9–10.3)
Chloride: 110 mmol/L (ref 98–111)
Creatinine, Ser: 1.97 mg/dL — ABNORMAL HIGH (ref 0.61–1.24)
GFR calc Af Amer: 37 mL/min — ABNORMAL LOW (ref >60)
GFR calc non Af Amer: 32 mL/min — ABNORMAL LOW (ref >60)
Glucose, Bld: 89 mg/dL (ref 70–99)
Potassium: 4 mmol/L (ref 3.5–5.1)
Sodium: 138 mmol/L (ref 135–145)
Total Bilirubin: 0.7 mg/dL (ref 0.3–1.2)
Total Protein: 7.4 g/dL (ref 6.5–8.1)

## 2019-01-18 MED ORDER — PROCHLORPERAZINE MALEATE 10 MG PO TABS
ORAL_TABLET | ORAL | Status: AC
  Filled 2019-01-18: qty 1

## 2019-01-18 MED ORDER — SODIUM CHLORIDE 0.9 % IV SOLN
15.4000 mg/kg | Freq: Once | INTRAVENOUS | Status: AC
  Administered 2019-01-18: 1200 mg via INTRAVENOUS
  Filled 2019-01-18: qty 60

## 2019-01-18 MED ORDER — SODIUM CHLORIDE 0.9 % IV SOLN
Freq: Once | INTRAVENOUS | Status: AC
  Filled 2019-01-18: qty 250

## 2019-01-18 MED ORDER — SODIUM CHLORIDE 0.9% FLUSH
10.0000 mL | Freq: Once | INTRAVENOUS | Status: AC
  Administered 2019-01-18: 10 mL
  Filled 2019-01-18: qty 10

## 2019-01-18 MED ORDER — ACETAMINOPHEN 325 MG PO TABS
ORAL_TABLET | ORAL | Status: AC
  Filled 2019-01-18: qty 2

## 2019-01-18 MED ORDER — ACETAMINOPHEN 325 MG PO TABS
650.0000 mg | ORAL_TABLET | Freq: Once | ORAL | Status: AC
  Administered 2019-01-18: 650 mg via ORAL

## 2019-01-18 MED ORDER — HEPARIN SOD (PORK) LOCK FLUSH 100 UNIT/ML IV SOLN
500.0000 [IU] | Freq: Once | INTRAVENOUS | Status: AC | PRN
  Administered 2019-01-18: 500 [IU]
  Filled 2019-01-18: qty 5

## 2019-01-18 MED ORDER — DIPHENHYDRAMINE HCL 25 MG PO CAPS
50.0000 mg | ORAL_CAPSULE | Freq: Once | ORAL | Status: AC
  Administered 2019-01-18: 50 mg via ORAL

## 2019-01-18 MED ORDER — METHYLPREDNISOLONE SODIUM SUCC 40 MG IJ SOLR
40.0000 mg | Freq: Once | INTRAMUSCULAR | Status: AC
  Administered 2019-01-18: 40 mg via INTRAVENOUS

## 2019-01-18 MED ORDER — DIPHENHYDRAMINE HCL 25 MG PO CAPS
ORAL_CAPSULE | ORAL | Status: AC
  Filled 2019-01-18: qty 2

## 2019-01-18 MED ORDER — PROCHLORPERAZINE MALEATE 10 MG PO TABS
10.0000 mg | ORAL_TABLET | Freq: Once | ORAL | Status: AC
  Administered 2019-01-18: 10 mg via ORAL

## 2019-01-18 MED ORDER — METHYLPREDNISOLONE SODIUM SUCC 40 MG IJ SOLR
INTRAMUSCULAR | Status: AC
  Filled 2019-01-18: qty 1

## 2019-01-18 MED ORDER — SODIUM CHLORIDE 0.9% FLUSH
10.0000 mL | INTRAVENOUS | Status: DC | PRN
  Administered 2019-01-18: 10 mL
  Filled 2019-01-18: qty 10

## 2019-01-18 NOTE — Patient Instructions (Signed)
[facility] Discharge Instructions for Patients Receiving Chemotherapy  Today you received the following chemotherapy agents :  Daratumumab.  To help prevent nausea and vomiting after your treatment, we encourage you to take your nausea medication as prescribed.   If you develop nausea and vomiting that is not controlled by your nausea medication, call the clinic.   BELOW ARE SYMPTOMS THAT SHOULD BE REPORTED IMMEDIATELY:  *FEVER GREATER THAN 100.5 F  *CHILLS WITH OR WITHOUT FEVER  NAUSEA AND VOMITING THAT IS NOT CONTROLLED WITH YOUR NAUSEA MEDICATION  *UNUSUAL SHORTNESS OF BREATH  *UNUSUAL BRUISING OR BLEEDING  TENDERNESS IN MOUTH AND THROAT WITH OR WITHOUT PRESENCE OF ULCERS  *URINARY PROBLEMS  *BOWEL PROBLEMS  UNUSUAL RASH Items with * indicate a potential emergency and should be followed up as soon as possible.  Feel free to call the clinic should you have any questions or concerns. The clinic phone number is [phone].  Please show the CHEMO ALERT CARD at check-in to the Emergency Department and triage nurse.   

## 2019-01-18 NOTE — Progress Notes (Signed)
Per Dr. Alvy Bimler, ok to proceed with treatment with Scr. 1.97.

## 2019-01-19 ENCOUNTER — Telehealth: Payer: Self-pay | Admitting: Hematology and Oncology

## 2019-01-19 ENCOUNTER — Encounter: Payer: Self-pay | Admitting: Hematology and Oncology

## 2019-01-19 LAB — KAPPA/LAMBDA LIGHT CHAINS
Kappa free light chain: 300.9 mg/L — ABNORMAL HIGH (ref 3.3–19.4)
Kappa, lambda light chain ratio: 28.93 — ABNORMAL HIGH (ref 0.26–1.65)
Lambda free light chains: 10.4 mg/L (ref 5.7–26.3)

## 2019-01-19 NOTE — Assessment & Plan Note (Signed)
I recommend increase oral fluid intake as tolerated We will monitor his kidney function carefully. We will proceed regardless of creatinine function with daratumumab

## 2019-01-19 NOTE — Assessment & Plan Note (Signed)
He has significant dry skin We discussed the importance of adequate hydration and moisturizing cream

## 2019-01-19 NOTE — Telephone Encounter (Signed)
Scheduled per 1/11 sch msg. Called and left msg. Mailing printout

## 2019-01-19 NOTE — Assessment & Plan Note (Signed)
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

## 2019-01-19 NOTE — Assessment & Plan Note (Signed)
His recent myeloma panel was stable.  Repeat myeloma panel from today is pending; the panel from 4 weeks ago showed stable disease control He has widely fluctuating serum light chain due to his chronic kidney disease He also have occasional elevated M protein due to dehydration; if the M protein continues to trend upwards closer to 1.5, I will plan to start staging imaging again along with 24-hour urine collection We will continue pomalidomide along with monthly daratumumab He will continue acyclovir for antimicrobial prophylaxis He will continue calcium with vitamin D along with Zometa

## 2019-01-19 NOTE — Progress Notes (Signed)
[facility] OFFICE PROGRESS NOTE  Patient Care Team: Lucianne Lei, MD as PCP - General (Family Medicine) Heath Lark, MD as Consulting Physician (Hematology and Oncology) Beverely Pace, LCSW as Social Worker (General Practice)  ASSESSMENT & PLAN:  Multiple myeloma in relapse [facility]) His recent myeloma panel was stable.  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 We will proceed regardless of creatinine function with daratumumab   Dry skin dermatitis He has significant dry skin We discussed the importance of adequate hydration and moisturizing cream   No orders of the defined types were placed in this encounter.   All questions were answered. The patient knows to call the clinic with any problems, questions or concerns. The total time spent in the appointment was 20 minutes encounter with patients including review of chart and various tests results, discussions about plan of care and coordination of care plan   Heath Lark, MD 01/19/2019 7:31 AM  INTERVAL HISTORY: Please see below for problem oriented charting. He returns for treatment and follow-up He denies recent  infection, fever or chills No new bone pain Denies osteonecrosis of the jaw No infusion reaction so far No recent bleeding  SUMMARY OF ONCOLOGIC HISTORY: Oncology History Overview Note  Progressed on Elotuzumab, revlimid, Velcade, Daratumumab and Pomalyst   Multiple myeloma in relapse ([place])  12/12/2010 Initial Diagnosis   Multiple myeloma   02/16/2014 Imaging   Skeletal survey showed diffuse osteopenia   03/02/2014 Bone Marrow Biopsy   Accession: [id number] BM biopsy showed only 5 % plasma cells. However, the biopsy was difficult and the bone was fragmented during the procedure. Cytogenetics and FISH study is normal   03/03/2014 Procedure   he has port placement   03/10/2014 - 05/19/2014 Chemotherapy   he received elotuzumab and revlimid   05/20/2014 - 02/25/2016 Chemotherapy   Treatment is switched to maintenance Revlimid only. Treatment is stopped due to progressive disease   11/15/2014 Adverse Reaction   He has mild worsening anemia. Does of Revlimid reduced to 5 mg 21 days on, 7 days off   03/06/2016 -  Chemotherapy   He received Daratumumab and Velcade. Velcade is stopped on 07/24/16   03/06/2016 -  Chemotherapy   The patient had daratumumab (DARZALEX) 1,200 mg in sodium chloride 0.9 % 940 mL (1.2 mg/mL) chemo infusion, 15.3 mg/kg = 1,260 mg, Intravenous, Once, 1 of 1 cycle Administration: 1,200 mg (03/06/2016) daratumumab (DARZALEX) 1,200 mg in sodium chloride 0.9 % 440 mL (2.4 mg/mL) chemo infusion, 15.3 mg/kg = 1,260 mg, Intravenous, Once, 14 of 14 cycles Administration: 1,200 mg (03/13/2016), 1,200 mg (03/20/2016), 1,200 mg (05/01/2016), 1,200 mg (05/22/2016), 1,200 mg (03/27/2016), 1,200 mg (04/03/2016), 1,200 mg (04/10/2016), 1,200 mg (04/17/2016), 1,200 mg (04/24/2016), 1,200 mg (06/12/2016), 1,200 mg (  07/03/2016), 1,200 mg (07/24/2016), 1,200 mg (08/14/2016), 1,200 mg (09/04/2016) daratumumab (DARZALEX) 1,200 mg in sodium chloride 0.9 % 440 mL chemo infusion, 15.2 mg/kg = 1,260 mg, Intravenous,  Once, 31 of 32 cycles Dose modification: 16 mg/kg (original dose 16 mg/kg, Cycle 40, Reason: Other (see comments), Comment: weight gain) Administration: 1,200 mg (09/25/2016), 1,200 mg (10/16/2016), 1,200 mg (11/13/2016), 1,100 mg (01/09/2017), 1,100 mg (12/12/2016), 1,100 mg (02/06/2017), 1,100 mg (03/06/2017), 1,100 mg (04/03/2017), 1,100 mg (05/05/2017), 1,100 mg (06/05/2017), 1,100 mg (07/03/2017), 1,100 mg (07/31/2017), 1,100 mg (08/28/2017), 1,100 mg (09/29/2017), 1,100 mg (10/27/2017), 1,100 mg (11/24/2017), 1,100 mg (12/22/2017), 1,100 mg (01/19/2018), 1,100 mg (02/16/2018), 1,100 mg (03/16/2018), 1,100 mg (04/13/2018), 1,100 mg (05/11/2018), 1,100 mg (06/08/2018), 1,200 mg (07/06/2018), 1,200 mg (08/03/2018), 1,200 mg (08/31/2018), 1,200 mg (09/28/2018), 1,200 mg (10/26/2018), 1,200 mg (11/23/2018), 1,200 mg (12/21/2018), 1,200 mg (01/18/2019)  for chemotherapy treatment.    08/14/2016 Miscellaneous   Pomalyst is added along with Daratumumab   10/17/2016 Surgery   EGD was performed for coffee-ground emesis. Multiple linear superficial ulcers were noted in the esophagus from 25-35 cm from insertion. Severe esophagitis was noted from 30-35 cm from insertion. Multiple biopsies were obtained. A small hiatal hernia was noted. The gastric cavity contained coffee-ground fluid, after suctioning and lavage, no obvious gastric lesions/erosion/ulceration/erythema was noted. Biopsies were taken from the antrum to rule out H. Pylori. Duodenum bulb appeared unremarkable. A small diverticulum was noted in second portion of the duodenum.     12/06/2016 Imaging   Ct scan abdomen No acute findings.  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  Stable small hiatal hernia.  Colonic diverticulosis, without radiographic evidence of diverticulitis.     REVIEW OF SYSTEMS:   Constitutional: Denies fevers, chills or abnormal weight loss Eyes: Denies blurriness of vision Ears, nose, mouth,  throat, and face: Denies mucositis or sore throat Respiratory: Denies cough, dyspnea or wheezes Cardiovascular: Denies palpitation, chest discomfort or lower extremity swelling Gastrointestinal:  Denies nausea, heartburn or change in bowel habits Skin: Denies abnormal skin rashes Lymphatics: Denies new lymphadenopathy or easy bruising Neurological:Denies numbness, tingling or new weaknesses Behavioral/Psych: Mood is stable, no new changes  All other systems were reviewed with the patient and are negative.  I have reviewed the past medical history, past surgical history, social history and family history with the patient and they are unchanged from previous note.  ALLERGIES:  has No Known Allergies.  MEDICATIONS:  Current Outpatient Medications  Medication Sig Dispense Refill  . acyclovir (ZOVIRAX) 400 MG tablet Take 1 tablet (400 mg total) by mouth daily. 60 tablet 11  . allopurinol (ZYLOPRIM) 100 MG tablet Take 100 mg by mouth 2 (two) times daily.     Marland Kitchen amLODipine (NORVASC) 10 MG tablet Take 10 mg by mouth daily.  1  . aspirin EC 81 MG tablet Take 81 mg by mouth daily.    Marland Kitchen atorvastatin (LIPITOR) 20 MG tablet Take 20 mg by mouth at bedtime.     . brimonidine (ALPHAGAN) 0.2 % ophthalmic solution Place 1 drop into the right eye 3 (three) times daily.     . calcium carbonate (TUMS - DOSED IN MG ELEMENTAL CALCIUM) 500 MG chewable tablet Chew 1 tablet by mouth daily as needed for indigestion or heartburn.    . cholecalciferol (VITAMIN D) 1000 UNITS tablet Take 1,000 Units by mouth daily.    . dorzolamide-timolol (COSOPT) 22.3-6.8 MG/ML ophthalmic solution Place 1 drop into the right eye 2 (two) times daily.     Marland Kitchen  fluorometholone (FML) 0.1 % ophthalmic suspension Place 1 drop into the right eye daily.     Marland Kitchen latanoprost (XALATAN) 0.005 % ophthalmic solution Place 1 drop into the right eye at bedtime.    . lidocaine-prilocaine (EMLA) cream Apply to affected area once 30 g 3  . ondansetron  (ZOFRAN) 8 MG tablet Take 1 tablet (8 mg total) by mouth 2 (two) times daily as needed (Nausea or vomiting). 30 tablet 1  . pantoprazole (PROTONIX) 40 MG tablet Take 1 tablet (40 mg total) by mouth daily. 180 tablet 3  . pomalidomide (POMALYST) 3 MG capsule Take 1 capsule (3 mg total) by mouth daily. Take with water on days 1-21. Repeat every 28 days. 21 capsule 0   No current facility-administered medications for this visit.    PHYSICAL EXAMINATION: ECOG PERFORMANCE STATUS: 1 - Symptomatic but completely ambulatory  Vitals:   01/18/19 1113  BP: (!) 143/53  Pulse: 71  Resp: 17  Temp: 98.2 F (36.8 C)  SpO2: 100%   Filed Weights   01/18/19 1113  Weight: 175 lb 9.6 oz (79.7 kg)    GENERAL:alert, no distress and comfortable SKIN: Noted significant dry skin EYES: normal, Conjunctiva are pink and non-injected, sclera clear OROPHARYNX:no exudate, no erythema and lips, buccal mucosa, and tongue normal  NECK: supple, thyroid normal size, non-tender, without nodularity LYMPH:  no palpable lymphadenopathy in the cervical, axillary or inguinal LUNGS: clear to auscultation and percussion with normal breathing effort HEART: regular rate & rhythm and no murmurs and no lower extremity edema ABDOMEN:abdomen soft, non-tender and normal bowel sounds Musculoskeletal:no cyanosis of digits and no clubbing  NEURO: alert & oriented x 3 with fluent speech, no focal motor/sensory deficits  LABORATORY DATA:  I have reviewed the data as listed    Component Value Date/Time   NA 138 01/18/2019 1050   NA 140 01/09/2017 0913   K 4.0 01/18/2019 1050   K 3.7 01/09/2017 0913   CL 110 01/18/2019 1050   CL 107 06/29/2012 1131   CO2 20 (L) 01/18/2019 1050   CO2 21 (L) 01/09/2017 0913   GLUCOSE 89 01/18/2019 1050   GLUCOSE 80 01/09/2017 0913   GLUCOSE 143 (H) 06/29/2012 1131   BUN 13 01/18/2019 1050   BUN 9.8 01/09/2017 0913   CREATININE 1.97 (H) 01/18/2019 1050   CREATININE 1.85 (H) 07/06/2018 0935    CREATININE 1.5 (H) 01/09/2017 0913   CALCIUM 8.5 (L) 01/18/2019 1050   CALCIUM 7.5 (L) 01/09/2017 0913   PROT 7.4 01/18/2019 1050   PROT 5.7 (L) 01/09/2017 0913   ALBUMIN 3.5 01/18/2019 1050   ALBUMIN 2.9 (L) 01/09/2017 0913   AST 11 (L) 01/18/2019 1050   AST 14 (L) 07/06/2018 0935   AST 12 01/09/2017 0913   ALT 9 01/18/2019 1050   ALT 11 07/06/2018 0935   ALT 10 01/09/2017 0913   ALKPHOS 59 01/18/2019 1050   ALKPHOS 55 01/09/2017 0913   BILITOT 0.7 01/18/2019 1050   BILITOT 0.5 07/06/2018 0935   BILITOT 0.44 01/09/2017 0913   GFRNONAA 32 (L) 01/18/2019 1050   GFRNONAA 35 (L) 07/06/2018 0935   GFRAA 37 (L) 01/18/2019 1050   GFRAA 40 (L) 07/06/2018 0935    No results found for: SPEP, UPEP  Lab Results  Component Value Date   WBC 9.3 01/18/2019   NEUTROABS 3.6 01/18/2019   HGB 9.2 (L) 01/18/2019   HCT 27.6 (L) 01/18/2019   MCV 94.8 01/18/2019   PLT 257 01/18/2019  Chemistry      Component Value Date/Time   NA 138 01/18/2019 1050   NA 140 01/09/2017 0913   K 4.0 01/18/2019 1050   K 3.7 01/09/2017 0913   CL 110 01/18/2019 1050   CL 107 06/29/2012 1131   CO2 20 (L) 01/18/2019 1050   CO2 21 (L) 01/09/2017 0913   BUN 13 01/18/2019 1050   BUN 9.8 01/09/2017 0913   CREATININE 1.97 (H) 01/18/2019 1050   CREATININE 1.85 (H) 07/06/2018 0935   CREATININE 1.5 (H) 01/09/2017 0913      Component Value Date/Time   CALCIUM 8.5 (L) 01/18/2019 1050   CALCIUM 7.5 (L) 01/09/2017 0913   ALKPHOS 59 01/18/2019 1050   ALKPHOS 55 01/09/2017 0913   AST 11 (L) 01/18/2019 1050   AST 14 (L) 07/06/2018 0935   AST 12 01/09/2017 0913   ALT 9 01/18/2019 1050   ALT 11 07/06/2018 0935   ALT 10 01/09/2017 0913   BILITOT 0.7 01/18/2019 1050   BILITOT 0.5 07/06/2018 0935   BILITOT 0.44 01/09/2017 0913

## 2019-01-21 LAB — MULTIPLE MYELOMA PANEL, SERUM
Albumin SerPl Elph-Mcnc: 3.4 g/dL (ref 2.9–4.4)
Albumin/Glob SerPl: 1.1 (ref 0.7–1.7)
Alpha 1: 0.3 g/dL (ref 0.0–0.4)
Alpha2 Glob SerPl Elph-Mcnc: 0.7 g/dL (ref 0.4–1.0)
B-Globulin SerPl Elph-Mcnc: 0.9 g/dL (ref 0.7–1.3)
Gamma Glob SerPl Elph-Mcnc: 1.4 g/dL (ref 0.4–1.8)
Globulin, Total: 3.3 g/dL (ref 2.2–3.9)
IgA: 32 mg/dL — ABNORMAL LOW (ref 61–437)
IgG (Immunoglobin G), Serum: 1759 mg/dL — ABNORMAL HIGH (ref 603–1613)
IgM (Immunoglobulin M), Srm: <5 mg/dL — ABNORMAL LOW (ref 15–143)
M Protein SerPl Elph-Mcnc: 1.2 g/dL — ABNORMAL HIGH
Total Protein ELP: 6.7 g/dL (ref 6.0–8.5)

## 2019-01-25 ENCOUNTER — Other Ambulatory Visit: Payer: Self-pay | Admitting: *Deleted

## 2019-01-25 DIAGNOSIS — C9002 Multiple myeloma in relapse: Secondary | ICD-10-CM

## 2019-01-25 MED ORDER — POMALIDOMIDE 3 MG PO CAPS: 3.0000 mg | ORAL_CAPSULE | Freq: Every day | ORAL | 0 refills | Status: DC

## 2019-02-15 ENCOUNTER — Inpatient Hospital Stay: Payer: Medicare Other | Attending: Hematology and Oncology

## 2019-02-15 ENCOUNTER — Inpatient Hospital Stay: Payer: Medicare Other

## 2019-02-15 ENCOUNTER — Other Ambulatory Visit: Payer: Self-pay

## 2019-02-15 ENCOUNTER — Inpatient Hospital Stay (HOSPITAL_BASED_OUTPATIENT_CLINIC_OR_DEPARTMENT_OTHER): Payer: Medicare Other | Admitting: Hematology and Oncology

## 2019-02-15 ENCOUNTER — Encounter: Payer: Self-pay | Admitting: Hematology and Oncology

## 2019-02-15 ENCOUNTER — Other Ambulatory Visit: Payer: Self-pay | Admitting: Hematology and Oncology

## 2019-02-15 VITALS — BP 113/55 | HR 65 | Temp 98.0°F | Resp 17

## 2019-02-15 DIAGNOSIS — Z9221 Personal history of antineoplastic chemotherapy: Secondary | ICD-10-CM | POA: Insufficient documentation

## 2019-02-15 DIAGNOSIS — R5383 Other fatigue: Secondary | ICD-10-CM | POA: Diagnosis not present

## 2019-02-15 DIAGNOSIS — N183 Chronic kidney disease, stage 3 unspecified: Secondary | ICD-10-CM | POA: Insufficient documentation

## 2019-02-15 DIAGNOSIS — N1831 Chronic kidney disease, stage 3a: Secondary | ICD-10-CM | POA: Diagnosis not present

## 2019-02-15 DIAGNOSIS — C9002 Multiple myeloma in relapse: Secondary | ICD-10-CM | POA: Diagnosis not present

## 2019-02-15 DIAGNOSIS — E46 Unspecified protein-calorie malnutrition: Secondary | ICD-10-CM | POA: Insufficient documentation

## 2019-02-15 DIAGNOSIS — B029 Zoster without complications: Secondary | ICD-10-CM | POA: Diagnosis not present

## 2019-02-15 DIAGNOSIS — K449 Diaphragmatic hernia without obstruction or gangrene: Secondary | ICD-10-CM | POA: Diagnosis not present

## 2019-02-15 DIAGNOSIS — D61818 Other pancytopenia: Secondary | ICD-10-CM

## 2019-02-15 DIAGNOSIS — H538 Other visual disturbances: Secondary | ICD-10-CM | POA: Insufficient documentation

## 2019-02-15 DIAGNOSIS — M419 Scoliosis, unspecified: Secondary | ICD-10-CM | POA: Diagnosis not present

## 2019-02-15 DIAGNOSIS — Z7952 Long term (current) use of systemic steroids: Secondary | ICD-10-CM | POA: Insufficient documentation

## 2019-02-15 DIAGNOSIS — Z79899 Other long term (current) drug therapy: Secondary | ICD-10-CM | POA: Insufficient documentation

## 2019-02-15 DIAGNOSIS — D649 Anemia, unspecified: Secondary | ICD-10-CM | POA: Diagnosis not present

## 2019-02-15 DIAGNOSIS — M8580 Other specified disorders of bone density and structure, unspecified site: Secondary | ICD-10-CM | POA: Diagnosis not present

## 2019-02-15 DIAGNOSIS — Z95828 Presence of other vascular implants and grafts: Secondary | ICD-10-CM

## 2019-02-15 DIAGNOSIS — Z5112 Encounter for antineoplastic immunotherapy: Secondary | ICD-10-CM | POA: Diagnosis not present

## 2019-02-15 LAB — CBC WITH DIFFERENTIAL/PLATELET
Abs Immature Granulocytes: 0.08 10*3/uL — ABNORMAL HIGH (ref 0.00–0.07)
Basophils Absolute: 0.1 10*3/uL (ref 0.0–0.1)
Basophils Relative: 1 %
Eosinophils Absolute: 0.2 10*3/uL (ref 0.0–0.5)
Eosinophils Relative: 3 %
HCT: 27.4 % — ABNORMAL LOW (ref 39.0–52.0)
Hemoglobin: 9.1 g/dL — ABNORMAL LOW (ref 13.0–17.0)
Immature Granulocytes: 1 %
Lymphocytes Relative: 39 %
Lymphs Abs: 3.1 10*3/uL (ref 0.7–4.0)
MCH: 31.9 pg (ref 26.0–34.0)
MCHC: 33.2 g/dL (ref 30.0–36.0)
MCV: 96.1 fL (ref 80.0–100.0)
Monocytes Absolute: 0.7 10*3/uL (ref 0.1–1.0)
Monocytes Relative: 9 %
Neutro Abs: 3.8 10*3/uL (ref 1.7–7.7)
Neutrophils Relative %: 47 %
Platelets: 315 10*3/uL (ref 150–400)
RBC: 2.85 MIL/uL — ABNORMAL LOW (ref 4.22–5.81)
RDW: 16 % — ABNORMAL HIGH (ref 11.5–15.5)
WBC: 8.1 10*3/uL (ref 4.0–10.5)
nRBC: 0 % (ref 0.0–0.2)

## 2019-02-15 LAB — COMPREHENSIVE METABOLIC PANEL
ALT: 13 U/L (ref 0–44)
AST: 11 U/L — ABNORMAL LOW (ref 15–41)
Albumin: 3.4 g/dL — ABNORMAL LOW (ref 3.5–5.0)
Alkaline Phosphatase: 74 U/L (ref 38–126)
Anion gap: 9 (ref 5–15)
BUN: 14 mg/dL (ref 8–23)
CO2: 22 mmol/L (ref 22–32)
Calcium: 8.3 mg/dL — ABNORMAL LOW (ref 8.9–10.3)
Chloride: 109 mmol/L (ref 98–111)
Creatinine, Ser: 2.03 mg/dL — ABNORMAL HIGH (ref 0.61–1.24)
GFR calc Af Amer: 36 mL/min — ABNORMAL LOW (ref >60)
GFR calc non Af Amer: 31 mL/min — ABNORMAL LOW (ref >60)
Glucose, Bld: 96 mg/dL (ref 70–99)
Potassium: 4.2 mmol/L (ref 3.5–5.1)
Sodium: 140 mmol/L (ref 135–145)
Total Bilirubin: 0.7 mg/dL (ref 0.3–1.2)
Total Protein: 7.7 g/dL (ref 6.5–8.1)

## 2019-02-15 MED ORDER — SODIUM CHLORIDE 0.9% FLUSH
10.0000 mL | Freq: Once | INTRAVENOUS | Status: AC
  Administered 2019-02-15: 10 mL
  Filled 2019-02-15: qty 10

## 2019-02-15 MED ORDER — PROCHLORPERAZINE MALEATE 10 MG PO TABS
10.0000 mg | ORAL_TABLET | Freq: Once | ORAL | Status: AC
  Administered 2019-02-15: 10 mg via ORAL

## 2019-02-15 MED ORDER — HEPARIN SOD (PORK) LOCK FLUSH 100 UNIT/ML IV SOLN
500.0000 [IU] | Freq: Once | INTRAVENOUS | Status: AC | PRN
  Administered 2019-02-15: 500 [IU]
  Filled 2019-02-15: qty 5

## 2019-02-15 MED ORDER — ACETAMINOPHEN 325 MG PO TABS
ORAL_TABLET | ORAL | Status: AC
  Filled 2019-02-15: qty 2

## 2019-02-15 MED ORDER — SODIUM CHLORIDE 0.9% FLUSH
10.0000 mL | INTRAVENOUS | Status: DC | PRN
  Administered 2019-02-15: 10 mL
  Filled 2019-02-15: qty 10

## 2019-02-15 MED ORDER — METHYLPREDNISOLONE SODIUM SUCC 40 MG IJ SOLR
40.0000 mg | Freq: Once | INTRAMUSCULAR | Status: AC
  Administered 2019-02-15: 40 mg via INTRAVENOUS

## 2019-02-15 MED ORDER — METHYLPREDNISOLONE SODIUM SUCC 40 MG IJ SOLR
INTRAMUSCULAR | Status: AC
  Filled 2019-02-15: qty 1

## 2019-02-15 MED ORDER — ACETAMINOPHEN 325 MG PO TABS
650.0000 mg | ORAL_TABLET | Freq: Once | ORAL | Status: AC
  Administered 2019-02-15: 13:00:00 650 mg via ORAL

## 2019-02-15 MED ORDER — DIPHENHYDRAMINE HCL 25 MG PO CAPS
ORAL_CAPSULE | ORAL | Status: AC
  Filled 2019-02-15: qty 2

## 2019-02-15 MED ORDER — PROCHLORPERAZINE MALEATE 10 MG PO TABS
ORAL_TABLET | ORAL | Status: AC
  Filled 2019-02-15: qty 1

## 2019-02-15 MED ORDER — SODIUM CHLORIDE 0.9 % IV SOLN
15.4000 mg/kg | Freq: Once | INTRAVENOUS | Status: AC
  Administered 2019-02-15: 1200 mg via INTRAVENOUS
  Filled 2019-02-15: qty 60

## 2019-02-15 MED ORDER — DIPHENHYDRAMINE HCL 25 MG PO CAPS
50.0000 mg | ORAL_CAPSULE | Freq: Once | ORAL | Status: AC
  Administered 2019-02-15: 50 mg via ORAL

## 2019-02-15 MED ORDER — SODIUM CHLORIDE 0.9 % IV SOLN
Freq: Once | INTRAVENOUS | Status: AC
  Filled 2019-02-15: qty 250

## 2019-02-15 NOTE — Assessment & Plan Note (Signed)
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

## 2019-02-15 NOTE — Assessment & Plan Note (Signed)
I recommend increase oral fluid intake as tolerated We will monitor his kidney function carefully. We will proceed regardless of creatinine function with daratumumab

## 2019-02-15 NOTE — Assessment & Plan Note (Signed)
He has recent shingles outbreak on his right torso It is already crusted and healed He has no pain or complication from this I recommend observation only He will continue taking acyclovir

## 2019-02-15 NOTE — Patient Instructions (Signed)
[facility] Discharge Instructions for Patients Receiving Chemotherapy  Today you received the following chemotherapy agents: Darzalex  To help prevent nausea and vomiting after your treatment, we encourage you to take your nausea medication as directed.    If you develop nausea and vomiting that is not controlled by your nausea medication, call the clinic.   BELOW ARE SYMPTOMS THAT SHOULD BE REPORTED IMMEDIATELY:  *FEVER GREATER THAN 100.5 F  *CHILLS WITH OR WITHOUT FEVER  NAUSEA AND VOMITING THAT IS NOT CONTROLLED WITH YOUR NAUSEA MEDICATION  *UNUSUAL SHORTNESS OF BREATH  *UNUSUAL BRUISING OR BLEEDING  TENDERNESS IN MOUTH AND THROAT WITH OR WITHOUT PRESENCE OF ULCERS  *URINARY PROBLEMS  *BOWEL PROBLEMS  UNUSUAL RASH Items with * indicate a potential emergency and should be followed up as soon as possible.  Feel free to call the clinic should you have any questions or concerns. The clinic phone number is [phone].  Please show the CHEMO ALERT CARD at check-in to the Emergency Department and triage nurse.   

## 2019-02-15 NOTE — Assessment & Plan Note (Signed)
His recent myeloma panel was stable.  Repeat myeloma panel from today is pending; the panel from 4 weeks ago showed stable disease control He has widely fluctuating serum light chain due to his chronic kidney disease He also have occasional elevated M protein due to dehydration; if the M protein continues to trend upwards closer to 1.5, I will plan to start staging imaging again along with 24-hour urine collection We will continue pomalidomide along with monthly daratumumab He will continue acyclovir for antimicrobial prophylaxis He will continue calcium with vitamin D along with Zometa

## 2019-02-15 NOTE — Progress Notes (Signed)
[place] OFFICE PROGRESS NOTE  Patient Care Team: Lucianne Lei, MD as PCP - General (Family Medicine) Heath Lark, MD as Consulting Physician (Hematology and Oncology) Beverely Pace, LCSW as Social Worker (General Practice)  ASSESSMENT & PLAN:  Multiple myeloma in relapse [facility]) His recent myeloma panel was stable.  Repeat myeloma panel from today is pending; the panel from 4 weeks ago showed stable disease control He has widely fluctuating serum light chain due to his chronic kidney disease He also have occasional elevated M protein due to dehydration; if the M protein continues to trend upwards closer to 1.5, I will plan to start staging imaging again along with 24-hour urine collection We will continue pomalidomide along with monthly daratumumab He will continue acyclovir for antimicrobial prophylaxis He will continue calcium with vitamin D along with Zometa   Shingles rash He has recent shingles outbreak on his right torso It is already crusted and healed He has no pain or complication from this I recommend observation only He will continue taking acyclovir  Pancytopenia, acquired ([place]) He has multifactorial anemia and mild pancytopenia due to treatment He is not symptomatic Observe only  Chronic renal insufficiency, stage III (moderate) I recommend increase oral fluid intake as tolerated We will monitor his kidney function carefully. We will proceed regardless of creatinine function with daratumumab    No orders of the defined types were placed in this encounter.   All questions were answered. The patient knows to call the clinic with any problems, questions or concerns. The total time spent in the appointment was 20 minutes encounter with patients including review of chart and various tests results, discussions about plan of care and coordination of care plan   Heath Lark, MD 02/15/2019 10:57 AM  INTERVAL HISTORY: Please see below for problem  oriented charting. He returns for treatment and follow-up He had recent skin rash on his right torso/axillary region several weeks ago He denies pain No recent fever or chills No recent changes in bowel habits He tolerated chemo very well so far SUMMARY OF ONCOLOGIC HISTORY: Oncology History Overview Note  Progressed on Elotuzumab, revlimid, Velcade, Daratumumab and Pomalyst   Multiple myeloma in relapse ([place])  12/12/2010 Initial Diagnosis   Multiple myeloma   02/16/2014 Imaging   Skeletal survey showed diffuse osteopenia   03/02/2014 Bone Marrow Biopsy   Accession: [id number] BM biopsy showed only 5 % plasma cells. However, the biopsy was difficult and the bone was fragmented during the procedure. Cytogenetics and FISH study is normal   03/03/2014 Procedure   he has port placement   03/10/2014 - 05/19/2014 Chemotherapy   he received elotuzumab and revlimid   05/20/2014 - 02/25/2016 Chemotherapy   Treatment is switched to maintenance Revlimid only. Treatment is stopped due to progressive disease   11/15/2014 Adverse Reaction   He has mild worsening anemia. Does of Revlimid reduced to 5 mg 21 days on, 7 days off   03/06/2016 -  Chemotherapy   He received Daratumumab and Velcade. Velcade is stopped on 07/24/16   03/06/2016 -  Chemotherapy   The patient had daratumumab (DARZALEX) 1,200 mg in sodium chloride 0.9 % 940 mL (1.2 mg/mL) chemo infusion, 15.3 mg/kg = 1,260 mg, Intravenous, Once, 1 of 1 cycle Administration: 1,200 mg (03/06/2016) daratumumab (DARZALEX) 1,200 mg in sodium chloride 0.9 % 440 mL (2.4 mg/mL) chemo infusion, 15.3 mg/kg = 1,260 mg, Intravenous, Once, 14 of 14 cycles Administration: 1,200 mg (03/13/2016), 1,200 mg (03/20/2016), 1,200 mg (05/01/2016), 1,200  mg (05/22/2016), 1,200 mg (03/27/2016), 1,200 mg (04/03/2016), 1,200 mg (04/10/2016), 1,200 mg (04/17/2016), 1,200 mg (04/24/2016), 1,200 mg (06/12/2016), 1,200 mg (07/03/2016), 1,200 mg (07/24/2016), 1,200 mg (08/14/2016), 1,200 mg  (09/04/2016) daratumumab (DARZALEX) 1,200 mg in sodium chloride 0.9 % 440 mL chemo infusion, 15.2 mg/kg = 1,260 mg, Intravenous, Once, 31 of 33 cycles Dose modification: 16 mg/kg (original dose 16 mg/kg, Cycle 40, Reason: Other (see comments), Comment: weight gain) Administration: 1,200 mg (09/25/2016), 1,200 mg (10/16/2016), 1,200 mg (11/13/2016), 1,100 mg (01/09/2017), 1,100 mg (12/12/2016), 1,100 mg (02/06/2017), 1,100 mg (03/06/2017), 1,100 mg (04/03/2017), 1,100 mg (05/05/2017), 1,100 mg (06/05/2017), 1,100 mg (07/03/2017), 1,100 mg (07/31/2017), 1,100 mg (08/28/2017), 1,100 mg (09/29/2017), 1,100 mg (10/27/2017), 1,100 mg (11/24/2017), 1,100 mg (12/22/2017), 1,100 mg (01/19/2018), 1,100 mg (02/16/2018), 1,100 mg (03/16/2018), 1,100 mg (04/13/2018), 1,100 mg (05/11/2018), 1,100 mg (06/08/2018), 1,200 mg (07/06/2018), 1,200 mg (08/03/2018), 1,200 mg (08/31/2018), 1,200 mg (09/28/2018), 1,200 mg (10/26/2018), 1,200 mg (11/23/2018), 1,200 mg (12/21/2018), 1,200 mg (01/18/2019)  for chemotherapy treatment.    08/14/2016 Miscellaneous   Pomalyst is added along with Daratumumab   10/17/2016 Surgery   EGD was performed for coffee-ground emesis. Multiple linear superficial ulcers were noted in the esophagus from 25-35 cm from insertion. Severe esophagitis was noted from 30-35 cm from insertion. Multiple biopsies were obtained. A small hiatal hernia was noted. The gastric cavity contained coffee-ground fluid, after suctioning and lavage, no obvious gastric lesions/erosion/ulceration/erythema was noted. Biopsies were taken from the antrum to rule out H. Pylori. Duodenum bulb appeared unremarkable. A small diverticulum was noted in second portion of the duodenum.     12/06/2016 Imaging   Ct scan abdomen No acute findings.  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  Stable small hiatal hernia.  Colonic diverticulosis, without radiographic evidence of diverticulitis.     REVIEW OF  SYSTEMS:   Constitutional: Denies fevers, chills or abnormal weight loss Eyes: Denies blurriness of vision Ears, nose, mouth, throat, and face: Denies mucositis or sore throat Respiratory: Denies cough, dyspnea or wheezes Cardiovascular: Denies palpitation, chest discomfort or lower extremity swelling Gastrointestinal:  Denies nausea, heartburn or change in bowel habits Lymphatics: Denies new lymphadenopathy or easy bruising Neurological:Denies numbness, tingling or new weaknesses Behavioral/Psych: Mood is stable, no new changes  All other systems were reviewed with the patient and are negative.  I have reviewed the past medical history, past surgical history, social history and family history with the patient and they are unchanged from previous note.  ALLERGIES:  has No Known Allergies.  MEDICATIONS:  Current Outpatient Medications  Medication Sig Dispense Refill  . acyclovir (ZOVIRAX) 400 MG tablet Take 1 tablet (400 mg total) by mouth daily. 60 tablet 11  . allopurinol (ZYLOPRIM) 100 MG tablet Take 100 mg by mouth 2 (two) times daily.     Marland Kitchen amLODipine (NORVASC) 10 MG tablet Take 10 mg by mouth daily.  1  . aspirin EC 81 MG tablet Take 81 mg by mouth daily.    Marland Kitchen atorvastatin (LIPITOR) 20 MG tablet Take 20 mg by mouth at bedtime.     . brimonidine (ALPHAGAN) 0.2 % ophthalmic solution Place 1 drop into the right eye 3 (three) times daily.     . calcium carbonate (TUMS - DOSED IN MG ELEMENTAL CALCIUM) 500 MG chewable tablet Chew 1 tablet by mouth daily as needed for indigestion or heartburn.    . cholecalciferol (VITAMIN D) 1000 UNITS tablet Take 1,000 Units by mouth daily.    . dorzolamide-timolol (COSOPT)  22.3-6.8 MG/ML ophthalmic solution Place 1 drop into the right eye 2 (two) times daily.     . fluorometholone (FML) 0.1 % ophthalmic suspension Place 1 drop into the right eye daily.     Marland Kitchen latanoprost (XALATAN) 0.005 % ophthalmic solution Place 1 drop into the right eye at bedtime.     . lidocaine-prilocaine (EMLA) cream Apply to affected area once 30 g 3  . ondansetron (ZOFRAN) 8 MG tablet Take 1 tablet (8 mg total) by mouth 2 (two) times daily as needed (Nausea or vomiting). 30 tablet 1  . pantoprazole (PROTONIX) 40 MG tablet Take 1 tablet (40 mg total) by mouth daily. 180 tablet 3  . pomalidomide (POMALYST) 3 MG capsule Take 1 capsule (3 mg total) by mouth daily. Take with water on days 1-21. Repeat every 28 days. 21 capsule 0   No current facility-administered medications for this visit.    PHYSICAL EXAMINATION: ECOG PERFORMANCE STATUS: 1 - Symptomatic but completely ambulatory  Vitals:   02/15/19 1044  BP: (!) 155/59  Pulse: 80  Resp: 18  Temp: 98.7 F (37.1 C)  SpO2: 100%   Filed Weights   02/15/19 1044  Weight: 154 lb 9.6 oz (70.1 kg)    GENERAL:alert, no distress and comfortable SKIN: Noted skin rash on his torso consistent with recent shingles but it is healed EYES: normal, Conjunctiva are pink and non-injected, sclera clear OROPHARYNX:no exudate, no erythema and lips, buccal mucosa, and tongue normal  NECK: supple, thyroid normal size, non-tender, without nodularity LYMPH:  no palpable lymphadenopathy in the cervical, axillary or inguinal LUNGS: clear to auscultation and percussion with normal breathing effort HEART: regular rate & rhythm and no murmurs and no lower extremity edema ABDOMEN:abdomen soft, non-tender and normal bowel sounds Musculoskeletal:no cyanosis of digits and no clubbing  NEURO: alert & oriented x 3 with fluent speech, no focal motor/sensory deficits  LABORATORY DATA:  I have reviewed the data as listed    Component Value Date/Time   NA 138 01/18/2019 1050   NA 140 01/09/2017 0913   K 4.0 01/18/2019 1050   K 3.7 01/09/2017 0913   CL 110 01/18/2019 1050   CL 107 06/29/2012 1131   CO2 20 (L) 01/18/2019 1050   CO2 21 (L) 01/09/2017 0913   GLUCOSE 89 01/18/2019 1050   GLUCOSE 80 01/09/2017 0913   GLUCOSE 143 (H)  06/29/2012 1131   BUN 13 01/18/2019 1050   BUN 9.8 01/09/2017 0913   CREATININE 1.97 (H) 01/18/2019 1050   CREATININE 1.85 (H) 07/06/2018 0935   CREATININE 1.5 (H) 01/09/2017 0913   CALCIUM 8.5 (L) 01/18/2019 1050   CALCIUM 7.5 (L) 01/09/2017 0913   PROT 7.4 01/18/2019 1050   PROT 5.7 (L) 01/09/2017 0913   ALBUMIN 3.5 01/18/2019 1050   ALBUMIN 2.9 (L) 01/09/2017 0913   AST 11 (L) 01/18/2019 1050   AST 14 (L) 07/06/2018 0935   AST 12 01/09/2017 0913   ALT 9 01/18/2019 1050   ALT 11 07/06/2018 0935   ALT 10 01/09/2017 0913   ALKPHOS 59 01/18/2019 1050   ALKPHOS 55 01/09/2017 0913   BILITOT 0.7 01/18/2019 1050   BILITOT 0.5 07/06/2018 0935   BILITOT 0.44 01/09/2017 0913   GFRNONAA 32 (L) 01/18/2019 1050   GFRNONAA 35 (L) 07/06/2018 0935   GFRAA 37 (L) 01/18/2019 1050   GFRAA 40 (L) 07/06/2018 0935    No results found for: SPEP, UPEP  Lab Results  Component Value Date   WBC 8.1 02/15/2019  NEUTROABS 3.8 02/15/2019   HGB 9.1 (L) 02/15/2019   HCT 27.4 (L) 02/15/2019   MCV 96.1 02/15/2019   PLT 315 02/15/2019      Chemistry      Component Value Date/Time   NA 138 01/18/2019 1050   NA 140 01/09/2017 0913   K 4.0 01/18/2019 1050   K 3.7 01/09/2017 0913   CL 110 01/18/2019 1050   CL 107 06/29/2012 1131   CO2 20 (L) 01/18/2019 1050   CO2 21 (L) 01/09/2017 0913   BUN 13 01/18/2019 1050   BUN 9.8 01/09/2017 0913   CREATININE 1.97 (H) 01/18/2019 1050   CREATININE 1.85 (H) 07/06/2018 0935   CREATININE 1.5 (H) 01/09/2017 0913      Component Value Date/Time   CALCIUM 8.5 (L) 01/18/2019 1050   CALCIUM 7.5 (L) 01/09/2017 0913   ALKPHOS 59 01/18/2019 1050   ALKPHOS 55 01/09/2017 0913   AST 11 (L) 01/18/2019 1050   AST 14 (L) 07/06/2018 0935   AST 12 01/09/2017 0913   ALT 9 01/18/2019 1050   ALT 11 07/06/2018 0935   ALT 10 01/09/2017 0913   BILITOT 0.7 01/18/2019 1050   BILITOT 0.5 07/06/2018 0935   BILITOT 0.44 01/09/2017 0913

## 2019-02-16 ENCOUNTER — Encounter (HOSPITAL_COMMUNITY): Payer: Self-pay | Admitting: Dentistry

## 2019-02-16 ENCOUNTER — Other Ambulatory Visit: Payer: Self-pay

## 2019-02-16 DIAGNOSIS — C9002 Multiple myeloma in relapse: Secondary | ICD-10-CM

## 2019-02-16 MED ORDER — POMALIDOMIDE 3 MG PO CAPS: 3.0000 mg | ORAL_CAPSULE | Freq: Every day | ORAL | 0 refills | Status: DC

## 2019-02-17 ENCOUNTER — Telehealth: Payer: Self-pay | Admitting: Hematology and Oncology

## 2019-02-17 LAB — MULTIPLE MYELOMA PANEL, SERUM
Albumin SerPl Elph-Mcnc: 3.3 g/dL (ref 2.9–4.4)
Albumin/Glob SerPl: 0.9 (ref 0.7–1.7)
Alpha 1: 0.4 g/dL (ref 0.0–0.4)
Alpha2 Glob SerPl Elph-Mcnc: 0.8 g/dL (ref 0.4–1.0)
B-Globulin SerPl Elph-Mcnc: 1 g/dL (ref 0.7–1.3)
Gamma Glob SerPl Elph-Mcnc: 1.6 g/dL (ref 0.4–1.8)
Globulin, Total: 3.8 g/dL (ref 2.2–3.9)
IgA: 33 mg/dL — ABNORMAL LOW (ref 61–437)
IgG (Immunoglobin G), Serum: 1860 mg/dL — ABNORMAL HIGH (ref 603–1613)
IgM (Immunoglobulin M), Srm: 6 mg/dL — ABNORMAL LOW (ref 15–143)
M Protein SerPl Elph-Mcnc: 1.5 g/dL — ABNORMAL HIGH
Total Protein ELP: 7.1 g/dL (ref 6.0–8.5)

## 2019-02-17 LAB — KAPPA/LAMBDA LIGHT CHAINS
Kappa free light chain: 304.9 mg/L — ABNORMAL HIGH (ref 3.3–19.4)
Kappa, lambda light chain ratio: 24.79 — ABNORMAL HIGH (ref 0.26–1.65)
Lambda free light chains: 12.3 mg/L (ref 5.7–26.3)

## 2019-02-17 NOTE — Telephone Encounter (Signed)
Called both numbers - no answer and unable to leave message - mailed letter with March appt .

## 2019-02-18 ENCOUNTER — Other Ambulatory Visit: Payer: Self-pay | Admitting: Hematology and Oncology

## 2019-02-18 ENCOUNTER — Telehealth: Payer: Self-pay

## 2019-02-18 DIAGNOSIS — C9002 Multiple myeloma in relapse: Secondary | ICD-10-CM

## 2019-02-18 NOTE — Telephone Encounter (Signed)
Per MD recommendations patient has been scheduled for 24-hour urine collection, labs, and bone survey.   RN educated patient, and patient's wife on process of 24-hour urine collection.   Pt verbalized understanding with teach back method.  RN provided written instructions as well.    Pt scheduled for bone survey on 2/16 @ 10 am with 9:45 am arrival time.  Pt notified.  Scheduling message sent to coordinate MD follow up after scan.

## 2019-02-19 ENCOUNTER — Telehealth: Payer: Self-pay | Admitting: Hematology and Oncology

## 2019-02-19 NOTE — Telephone Encounter (Signed)
Scheduled per 2/11 sch msg. Called and spoke with pt, confirmed 2/22 appt

## 2019-02-22 ENCOUNTER — Inpatient Hospital Stay: Payer: Medicare Other

## 2019-02-22 ENCOUNTER — Other Ambulatory Visit: Payer: Self-pay | Admitting: *Deleted

## 2019-02-22 DIAGNOSIS — Z5112 Encounter for antineoplastic immunotherapy: Secondary | ICD-10-CM | POA: Diagnosis not present

## 2019-02-22 DIAGNOSIS — N183 Chronic kidney disease, stage 3 unspecified: Secondary | ICD-10-CM | POA: Diagnosis not present

## 2019-02-22 DIAGNOSIS — C9002 Multiple myeloma in relapse: Secondary | ICD-10-CM | POA: Diagnosis not present

## 2019-02-22 DIAGNOSIS — R5383 Other fatigue: Secondary | ICD-10-CM | POA: Diagnosis not present

## 2019-02-22 DIAGNOSIS — K449 Diaphragmatic hernia without obstruction or gangrene: Secondary | ICD-10-CM | POA: Diagnosis not present

## 2019-02-22 DIAGNOSIS — M419 Scoliosis, unspecified: Secondary | ICD-10-CM | POA: Diagnosis not present

## 2019-02-22 DIAGNOSIS — E46 Unspecified protein-calorie malnutrition: Secondary | ICD-10-CM | POA: Diagnosis not present

## 2019-02-22 DIAGNOSIS — Z7952 Long term (current) use of systemic steroids: Secondary | ICD-10-CM | POA: Diagnosis not present

## 2019-02-22 DIAGNOSIS — M8580 Other specified disorders of bone density and structure, unspecified site: Secondary | ICD-10-CM | POA: Diagnosis not present

## 2019-02-22 DIAGNOSIS — Z79899 Other long term (current) drug therapy: Secondary | ICD-10-CM | POA: Diagnosis not present

## 2019-02-22 DIAGNOSIS — H538 Other visual disturbances: Secondary | ICD-10-CM | POA: Diagnosis not present

## 2019-02-22 DIAGNOSIS — D649 Anemia, unspecified: Secondary | ICD-10-CM | POA: Diagnosis not present

## 2019-02-22 DIAGNOSIS — B029 Zoster without complications: Secondary | ICD-10-CM | POA: Diagnosis not present

## 2019-02-22 DIAGNOSIS — D61818 Other pancytopenia: Secondary | ICD-10-CM | POA: Diagnosis not present

## 2019-02-22 DIAGNOSIS — Z9221 Personal history of antineoplastic chemotherapy: Secondary | ICD-10-CM | POA: Diagnosis not present

## 2019-02-23 ENCOUNTER — Emergency Department (HOSPITAL_COMMUNITY)
Admission: EM | Admit: 2019-02-23 | Discharge: 2019-02-23 | Discharge status: Absent without leave | Payer: Medicare Other

## 2019-02-23 ENCOUNTER — Ambulatory Visit (HOSPITAL_COMMUNITY)
Admission: RE | Admit: 2019-02-23 | Discharge: 2019-02-23 | Disposition: A | Discharge status: Home / Self Care | Payer: Medicare Other | Source: Ambulatory Visit | Attending: Hematology and Oncology | Admitting: Hematology and Oncology

## 2019-02-23 ENCOUNTER — Other Ambulatory Visit: Payer: Self-pay

## 2019-02-23 DIAGNOSIS — C9002 Multiple myeloma in relapse: Secondary | ICD-10-CM

## 2019-02-23 DIAGNOSIS — C9000 Multiple myeloma not having achieved remission: Secondary | ICD-10-CM | POA: Diagnosis not present

## 2019-02-23 LAB — KAPPA/LAMBDA LIGHT CHAINS, FREE, WITH RATIO, 24HR. URINE
FR KAPPA LT CH,24HR: 2000.31 mg/24 hr
FR LAMBDA LT CH,24HR: 15.56 mg/24 hr
Free Kappa Lt Chains,Ur: 2105.59 mg/L — ABNORMAL HIGH (ref 0.63–113.79)
Free Kappa/Lambda Ratio: 128.55 — ABNORMAL HIGH (ref 1.03–31.76)
Free Lambda Lt Chains,Ur: 16.38 mg/L — ABNORMAL HIGH (ref 0.47–11.77)
Total Volume: 950

## 2019-03-01 ENCOUNTER — Telehealth: Payer: Self-pay | Admitting: Pharmacist

## 2019-03-01 ENCOUNTER — Encounter: Payer: Self-pay | Admitting: Hematology and Oncology

## 2019-03-01 ENCOUNTER — Other Ambulatory Visit: Payer: Self-pay

## 2019-03-01 ENCOUNTER — Inpatient Hospital Stay (HOSPITAL_BASED_OUTPATIENT_CLINIC_OR_DEPARTMENT_OTHER): Payer: Medicare Other | Admitting: Hematology and Oncology

## 2019-03-01 VITALS — BP 143/54 | HR 70 | Temp 98.9°F | Resp 18 | Ht 69.0 in | Wt 154.2 lb

## 2019-03-01 DIAGNOSIS — N1831 Chronic kidney disease, stage 3a: Secondary | ICD-10-CM | POA: Diagnosis not present

## 2019-03-01 DIAGNOSIS — C9001 Multiple myeloma in remission: Secondary | ICD-10-CM | POA: Diagnosis not present

## 2019-03-01 DIAGNOSIS — Z5112 Encounter for antineoplastic immunotherapy: Secondary | ICD-10-CM | POA: Diagnosis not present

## 2019-03-01 DIAGNOSIS — Z7189 Other specified counseling: Secondary | ICD-10-CM | POA: Diagnosis not present

## 2019-03-01 DIAGNOSIS — Z9221 Personal history of antineoplastic chemotherapy: Secondary | ICD-10-CM | POA: Diagnosis not present

## 2019-03-01 DIAGNOSIS — C9002 Multiple myeloma in relapse: Secondary | ICD-10-CM

## 2019-03-01 DIAGNOSIS — R5383 Other fatigue: Secondary | ICD-10-CM | POA: Diagnosis not present

## 2019-03-01 DIAGNOSIS — B029 Zoster without complications: Secondary | ICD-10-CM | POA: Diagnosis not present

## 2019-03-01 DIAGNOSIS — M8580 Other specified disorders of bone density and structure, unspecified site: Secondary | ICD-10-CM | POA: Diagnosis not present

## 2019-03-01 DIAGNOSIS — Z79899 Other long term (current) drug therapy: Secondary | ICD-10-CM | POA: Diagnosis not present

## 2019-03-01 DIAGNOSIS — N183 Chronic kidney disease, stage 3 unspecified: Secondary | ICD-10-CM | POA: Diagnosis not present

## 2019-03-01 DIAGNOSIS — D61818 Other pancytopenia: Secondary | ICD-10-CM | POA: Diagnosis not present

## 2019-03-01 DIAGNOSIS — H538 Other visual disturbances: Secondary | ICD-10-CM | POA: Diagnosis not present

## 2019-03-01 DIAGNOSIS — M419 Scoliosis, unspecified: Secondary | ICD-10-CM | POA: Diagnosis not present

## 2019-03-01 DIAGNOSIS — Z7952 Long term (current) use of systemic steroids: Secondary | ICD-10-CM | POA: Diagnosis not present

## 2019-03-01 DIAGNOSIS — E440 Moderate protein-calorie malnutrition: Secondary | ICD-10-CM | POA: Diagnosis not present

## 2019-03-01 DIAGNOSIS — D649 Anemia, unspecified: Secondary | ICD-10-CM | POA: Diagnosis not present

## 2019-03-01 DIAGNOSIS — K449 Diaphragmatic hernia without obstruction or gangrene: Secondary | ICD-10-CM | POA: Diagnosis not present

## 2019-03-01 DIAGNOSIS — E46 Unspecified protein-calorie malnutrition: Secondary | ICD-10-CM | POA: Diagnosis not present

## 2019-03-01 MED ORDER — ACYCLOVIR 400 MG PO TABS: 400.0000 mg | ORAL_TABLET | Freq: Every day | ORAL | 11 refills | Status: AC

## 2019-03-01 MED ORDER — SELINEXOR (80 MG TWICE WEEKLY) 20 MG PO TBPK: 80.0000 mg | ORAL_TABLET | ORAL | 11 refills | Status: DC

## 2019-03-01 MED ORDER — PROCHLORPERAZINE MALEATE 10 MG PO TABS: 10.0000 mg | ORAL_TABLET | Freq: Four times a day (QID) | ORAL | 1 refills | Status: AC | PRN

## 2019-03-01 MED ORDER — DEXAMETHASONE 4 MG PO TABS: ORAL_TABLET | ORAL | 11 refills | Status: DC

## 2019-03-01 MED ORDER — ONDANSETRON HCL 8 MG PO TABS: 8.0000 mg | ORAL_TABLET | Freq: Three times a day (TID) | ORAL | 1 refills | Status: AC | PRN

## 2019-03-01 NOTE — Assessment & Plan Note (Signed)
We had multiple goals of care discussions in the past Treatment goal is palliative in nature

## 2019-03-01 NOTE — Assessment & Plan Note (Signed)
He has slight worsening renal dysfunction recently, could be due to disease progression He will continue to hydrate as tolerated at home

## 2019-03-01 NOTE — Assessment & Plan Note (Signed)
His wife is concerned about weight loss and poor appetite I am hopeful, with plan to start him on high-dose dexamethasone, his appetite and weight will improve

## 2019-03-01 NOTE — Telephone Encounter (Signed)
Oral Oncology Pharmacist Encounter  Received new prescription for Xpovio (selinexor) for the treatment of relapsed in conjunction with dexamethasone, planned duration until disease progression or unacceptable drug toxicity.  CBC/CMP from 12-Apr-1945 assessed, no relevant lab abnormalities. Prescription dose and frequency assessed.   Current medication list in Epic reviewed, no DDIs with selinexor identified.  Prescription has been e-scribed to [employer] for benefits analysis and approval.  Oral Oncology Clinic will continue to follow for insurance authorization, copayment issues, initial counseling and start date.  Darl Pikes, PharmD, BCPS, [facility] Hematology/Oncology Clinical Pharmacist [facility]/AP [facility] [phone]  03/01/2019 2:13 PM

## 2019-03-01 NOTE — Progress Notes (Signed)
[place] OFFICE PROGRESS NOTE  Patient Care Team: Lucianne Lei, MD as PCP - General (Family Medicine) Heath Lark, MD as Consulting Physician (Hematology and Oncology) Beverely Pace, LCSW as Social Worker (General Practice)  ASSESSMENT & PLAN:  Multiple myeloma in relapse [facility]) I reviewed test results with the patient and his wife Unfortunately, the patient had disease progression He has currently progressed on multiple lines of therapy I reviewed the current NCCN guidelines and discussed options, including carfilzomib/Cytoxan, Belantamab, bendamustine, Panobinostat or [name]/dexamethasone The patient is undecided The patient had significant poor visual impairment.  I felt that Belantamab might not be a good option He is concerned about risk of heart disease/hair loss with combination carfilzomib/Cytoxan Ultimately, he is in agreement to try [name]/dexamethasone  This is based on recent FDA approval and publications as follows  We discussed the role of treatment is strictly palliative  Oral [name]-Dexamethasone for TripleClass Refractory Multiple Myeloma  A. Ashley Jacobs, D.T. Vogl, Mylo Red, Jodell Cipro, C.A. Zachery Conch, D. Dingli, C. Landry Mellow, S. Christean Leaf, M. Dimopoulos, A.K. Kathleene Hazel. Darron Doom, Santiago Bur, S. Amalia Hailey, M.S. Oneal Grout, M. Delforge, R.F. Evalyn Casco, J.E. Hoffman, L.J. [place], Ronn Melena. Dennis Bast, M. Schreder, N. Meuleman, L. Zigmund Daniel, Domenic Moras, S. Judson Roch, R.L. Comenzo, Franky Macho, T. Illmer, P. Vlummens, C. Doyen, T. Carlis Stable, A. Perrot, K. Podar, Starleen Blue, S. Eden Emms, S. Tang, C. Picklesimer, J.-R. Saint-Martin, M. Crochiere, H. Radene Knee, S. Alexia Freestone. Landesman, J. Clemon Chambers. Marvel Plan and [place] J Med [phone]. DOI: [id number]/[id number]  BACKGROUND [name], a selective inhibitor of nuclear export compound that blocks exportin 1 (XPO1) and forces  nuclear accumulation and activation of tumor suppressor proteins, inhibits nuclear factor ?B, and reduces oncoprotein messenger RNA translation, is a potential novel treatment for myeloma that is refractory to current therapeutic options. METHODS We administered oral selinexor (80 mg) plus dexamethasone (20 mg) twice weekly to patients with myeloma who had previous exposure to bortezomib, carfilzomib, lenalidomide, pomalidomide, daratumumab, and an alkylating agent and had disease refractory to at least one proteasome inhibitor, one immunomodulatory agent, and daratumumab (triple-class refractory). The primary end point was overall response, defined as a partial response or better, with response assessed by an independent review committee. Clinical benefit, defined as a minimal response or better, was a secondary end point.  RESULTS A total of 122 patients in the [place] were included in the modified intention-to-treat population (primary analysis), and 123 were included in the safety population. The median age was 84 years, and the median number of previous regimens was 7; a total of 53% of the patients had high-risk cytogenetic abnormalities. A partial response or better was observed in 26% of patients (95% confidence interval, 19 to 35), including two stringent complete responses; 39% of patients had a minimal response or better. The median duration of response was 4.4 months, median progression-free survival was 3.7 months, and median overall survival was 8.6 months. Fatigue, nausea, and decreased appetite were common and were typically grade 1 or 2 (grade 3 events were noted in up to 25% of patients, and no grade 4 events were reported). Thrombocytopenia occurred in 73% of the patients (grade 3 in 25% and grade 4 in 33%). Thrombocytopenia led to bleeding events of grade 3 or higher in 6 patients. CONCLUSIONS [name]-dexamethasone resulted in objective treatment responses in patients  with myeloma refractory to currently available therapies  Some of the short  term side-effects included, though not limited to, risk of fatigue, weight loss, tumor lysis syndrome, risk of allergic reactions, hyperglycemia, pancytopenia, risk of blood clots or bleeding, life-threatening infections, need for transfusions of blood products, admission to hospital for various reasons, and risks of death.   The patient is aware that the response rates discussed earlier is not guaranteed.    After a long discussion, patient made an informed decision to proceed with the prescribed plan of care.   I am concerned about uncontrolled diabetes while on high-dose dexamethasone I recommend he makes appointment to see his primary care doctor for blood sugar monitoring and management of diabetes if needed I will get insurance prior authorization for [name] Based on prescription label, we would not need dose adjustment for renal impairment He will continue acyclovir for antimicrobial prophylaxis, along with calcium and vitamin D for bone health He is instructed to stop pomalidomide due to disease progression I am hopeful we will get prescription authorized and ready to start treatment next week I plan to see him prior to second week of treatment along with blood count monitoring     Chronic renal insufficiency, stage III (moderate) He has slight worsening renal dysfunction recently, could be due to disease progression He will continue to hydrate as tolerated at home  Protein calorie malnutrition ([place]) His wife is concerned about weight loss and poor appetite I am hopeful, with plan to start him on high-dose dexamethasone, his appetite and weight will improve  Goals of care, counseling/discussion We had multiple goals of care discussions in the past Treatment goal is palliative in nature   No orders of the defined types were placed in this encounter.   All questions were answered. The patient  knows to call the clinic with any problems, questions or concerns. The total time spent in the appointment was 40 minutes encounter with patients including review of chart and various tests results, discussions about plan of care and coordination of care plan   Heath Lark, MD 03/02/2019 7:47 AM  INTERVAL HISTORY: Please see below for problem oriented charting. He is seen for further follow-up and review of test results Unfortunately, his wife was not present I spoke with his wife over the telephone during the visit to collaborate history with her The patient has been complaining of fatigue, spending more time in bed and with poor appetite He denies worsening bone pain He has chronic visual impairment secondary to cataracts and glaucoma No recent fever or chills  SUMMARY OF ONCOLOGIC HISTORY: Oncology History Overview Note  Progressed on Elotuzumab, revlimid, Velcade, Daratumumab and Pomalyst   Multiple myeloma in relapse ([place])  12/12/2010 Initial Diagnosis   Multiple myeloma   02/16/2014 Imaging   Skeletal survey showed diffuse osteopenia   03/02/2014 Bone Marrow Biopsy   Accession: [id number] biopsy showed only 5 % plasma cells. However, the biopsy was difficult and the bone was fragmented during the procedure. Cytogenetics and FISH study is normal   03/03/2014 Procedure   he has port placement   03/10/2014 - 05/19/2014 Chemotherapy   he received elotuzumab and revlimid   05/20/2014 - 02/25/2016 Chemotherapy   Treatment is switched to maintenance Revlimid only. Treatment is stopped due to progressive disease   11/15/2014 Adverse Reaction   He has mild worsening anemia. Does of Revlimid reduced to 5 mg 21 days on, 7 days off   03/06/2016 -  Chemotherapy   He received Daratumumab and Velcade. Velcade is stopped on 07/24/16   03/06/2016 -  02/15/2019 Chemotherapy   The patient had daratumumab with Pomalyst for chemotherapy treatment.     08/14/2016 Miscellaneous   Pomalyst is added  along with Daratumumab   10/17/2016 Surgery   EGD was performed for coffee-ground emesis. Multiple linear superficial ulcers were noted in the esophagus from 25-35 cm from insertion. Severe esophagitis was noted from 30-35 cm from insertion. Multiple biopsies were obtained. A small hiatal hernia was noted. The gastric cavity contained coffee-ground fluid, after suctioning and lavage, no obvious gastric lesions/erosion/ulceration/erythema was noted. Biopsies were taken from the antrum to rule out H. Pylori. Duodenum bulb appeared unremarkable. A small diverticulum was noted in second portion of the duodenum.     12/06/2016 Imaging   Ct scan abdomen No acute findings.  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  Stable small hiatal hernia.  Colonic diverticulosis, without radiographic evidence of diverticulitis.     REVIEW OF SYSTEMS:   Constitutional: Denies fevers, chills or abnormal weight loss Eyes: Denies blurriness of vision Ears, nose, mouth, throat, and face: Denies mucositis or sore throat Respiratory: Denies cough, dyspnea or wheezes Cardiovascular: Denies palpitation, chest discomfort or lower extremity swelling Gastrointestinal:  Denies nausea, heartburn or change in bowel habits Skin: Denies abnormal skin rashes Lymphatics: Denies new lymphadenopathy or easy bruising Neurological:Denies numbness, tingling or new weaknesses Behavioral/Psych: Mood is stable, no new changes  All other systems were reviewed with the patient and are negative.  I have reviewed the past medical history, past surgical history, social history and family history with the patient and they are unchanged from previous note.  ALLERGIES:  has No Known Allergies.  MEDICATIONS:  Current Outpatient Medications  Medication Sig Dispense Refill  . acyclovir (ZOVIRAX) 400 MG tablet Take 1 tablet (400 mg total) by mouth daily. 60 tablet 11  . allopurinol  (ZYLOPRIM) 100 MG tablet Take 100 mg by mouth 2 (two) times daily.     Marland Kitchen amLODipine (NORVASC) 10 MG tablet Take 10 mg by mouth daily.  1  . aspirin EC 81 MG tablet Take 81 mg by mouth daily.    Marland Kitchen atorvastatin (LIPITOR) 20 MG tablet Take 20 mg by mouth at bedtime.     . brimonidine (ALPHAGAN) 0.2 % ophthalmic solution Place 1 drop into the right eye 3 (three) times daily.     . calcium carbonate (TUMS - DOSED IN MG ELEMENTAL CALCIUM) 500 MG chewable tablet Chew 1 tablet by mouth daily as needed for indigestion or heartburn.    . cholecalciferol (VITAMIN D) 1000 UNITS tablet Take 1,000 Units by mouth daily.    Marland Kitchen dexamethasone (DECADRON) 4 MG tablet Take 5 tabs on days 1 and 3 every week, take with food, preferably in the morning 40 tablet 11  . dorzolamide-timolol (COSOPT) 22.3-6.8 MG/ML ophthalmic solution Place 1 drop into the right eye 2 (two) times daily.     . fluorometholone (FML) 0.1 % ophthalmic suspension Place 1 drop into the right eye daily.     Marland Kitchen latanoprost (XALATAN) 0.005 % ophthalmic solution Place 1 drop into the right eye at bedtime.    . lidocaine-prilocaine (EMLA) cream Apply to affected area once 30 g 3  . ondansetron (ZOFRAN) 8 MG tablet Take 1 tablet (8 mg total) by mouth every 8 (eight) hours as needed (Nausea or vomiting). 30 tablet 1  . pantoprazole (PROTONIX) 40 MG tablet Take 1 tablet (40 mg total) by mouth daily. 180 tablet 3  . prochlorperazine (COMPAZINE) 10 MG tablet Take 1 tablet (  10 mg total) by mouth every 6 (six) hours as needed for nausea or vomiting. 30 tablet 1  . [name], 80 MG Twice Weekly, 20 MG TBPK Take 80 mg by mouth 2 (two) times a week. Take 80 mg on days 1 and 3 every week 32 each 11   No current facility-administered medications for this visit.    PHYSICAL EXAMINATION: ECOG PERFORMANCE STATUS: 1 - Symptomatic but completely ambulatory  Vitals:   03/01/19 1121  BP: (!) 143/54  Pulse: 70  Resp: 18  Temp: 98.9 F (37.2 C)  SpO2: 100%    Filed Weights   03/01/19 1121  Weight: 154 lb 3.2 oz (69.9 kg)    GENERAL:alert, no distress and comfortable SKIN: Noted dry skin NEURO: alert & oriented x 3 with fluent speech, no focal motor/sensory deficits  LABORATORY DATA:  I have reviewed the data as listed    Component Value Date/Time   NA 140 02/15/2019 1035   NA 140 01/09/2017 0913   K 4.2 02/15/2019 1035   K 3.7 01/09/2017 0913   CL 109 02/15/2019 1035   CL 107 06/29/2012 1131   CO2 22 02/15/2019 1035   CO2 21 (L) 01/09/2017 0913   GLUCOSE 96 02/15/2019 1035   GLUCOSE 80 01/09/2017 0913   GLUCOSE 143 (H) 06/29/2012 1131   BUN 14 02/15/2019 1035   BUN 9.8 01/09/2017 0913   CREATININE 2.03 (H) 02/15/2019 1035   CREATININE 1.85 (H) 07/06/2018 0935   CREATININE 1.5 (H) 01/09/2017 0913   CALCIUM 8.3 (L) 02/15/2019 1035   CALCIUM 7.5 (L) 01/09/2017 0913   PROT 7.7 02/15/2019 1035   PROT 5.7 (L) 01/09/2017 0913   ALBUMIN 3.4 (L) 02/15/2019 1035   ALBUMIN 2.9 (L) 01/09/2017 0913   AST 11 (L) 02/15/2019 1035   AST 14 (L) 07/06/2018 0935   AST 12 01/09/2017 0913   ALT 13 02/15/2019 1035   ALT 11 07/06/2018 0935   ALT 10 01/09/2017 0913   ALKPHOS 74 02/15/2019 1035   ALKPHOS 55 01/09/2017 0913   BILITOT 0.7 02/15/2019 1035   BILITOT 0.5 07/06/2018 0935   BILITOT 0.44 01/09/2017 0913   GFRNONAA 31 (L) 02/15/2019 1035   GFRNONAA 35 (L) 07/06/2018 0935   GFRAA 36 (L) 02/15/2019 1035   GFRAA 40 (L) 07/06/2018 0935    No results found for: SPEP, UPEP  Lab Results  Component Value Date   WBC 8.1 02/15/2019   NEUTROABS 3.8 02/15/2019   HGB 9.1 (L) 02/15/2019   HCT 27.4 (L) 02/15/2019   MCV 96.1 02/15/2019   PLT 315 02/15/2019      Chemistry      Component Value Date/Time   NA 140 02/15/2019 1035   NA 140 01/09/2017 0913   K 4.2 02/15/2019 1035   K 3.7 01/09/2017 0913   CL 109 02/15/2019 1035   CL 107 06/29/2012 1131   CO2 22 02/15/2019 1035   CO2 21 (L) 01/09/2017 0913   BUN 14 02/15/2019 1035    BUN 9.8 01/09/2017 0913   CREATININE 2.03 (H) 02/15/2019 1035   CREATININE 1.85 (H) 07/06/2018 0935   CREATININE 1.5 (H) 01/09/2017 0913      Component Value Date/Time   CALCIUM 8.3 (L) 02/15/2019 1035   CALCIUM 7.5 (L) 01/09/2017 0913   ALKPHOS 74 02/15/2019 1035   ALKPHOS 55 01/09/2017 0913   AST 11 (L) 02/15/2019 1035   AST 14 (L) 07/06/2018 0935   AST 12 01/09/2017 0913   ALT 13 02/15/2019  1035   ALT 11 07/06/2018 0935   ALT 10 01/09/2017 0913   BILITOT 0.7 02/15/2019 1035   BILITOT 0.5 07/06/2018 0935   BILITOT 0.44 01/09/2017 0913       RADIOGRAPHIC STUDIES: I have personally reviewed the radiological images as listed and agreed with the findings in the report. DG Bone Survey Met  Result Date: 02/23/2019 CLINICAL DATA:  Multiple myeloma.  In remission. EXAM: METASTATIC BONE SURVEY COMPARISON:  None. FINDINGS: No calvarial lytic lesion. No lytic lesion in the shoulder girdles or upper extremities. No lytic lesions cervical spine. There is multilevel endplate flowing osteophytosis with bony fusion of the vertebral bodies. Levoscoliosis spine. No lytic lesion identified in the ribs. Dense osteophytosis of the lumbar spine with scoliosis. Sclerotic lesion in the LEFT iliac wing appears benign. IMPRESSION: No lytic lesion on whole-body skeletal survey 2 suggest active multiple myeloma. Severe disc osteophytic disease of the cervical, thoracic and lumbar spine. Scoliosis of the lumbar spine. Electronically Signed   By: Suzy Bouchard M.D.   On: 02/23/2019 17:18

## 2019-03-01 NOTE — Assessment & Plan Note (Addendum)
I reviewed test results with the patient and his wife Unfortunately, the patient had disease progression He has currently progressed on multiple lines of therapy I reviewed the current NCCN guidelines and discussed options, including carfilzomib/Cytoxan, Belantamab, bendamustine, Panobinostat or [name]/dexamethasone The patient is undecided The patient had significant poor visual impairment.  I felt that Belantamab might not be a good option He is concerned about risk of heart disease/hair loss with combination carfilzomib/Cytoxan Ultimately, he is in agreement to try [name]/dexamethasone  This is based on recent FDA approval and publications as follows  We discussed the role of treatment is strictly palliative  Oral [name]-Dexamethasone for TripleClass Refractory Multiple Myeloma  A. Ashley Jacobs, D.T. Vogl, Mylo Red, Jodell Cipro, C.A. Zachery Conch, D. Dingli, C. Landry Mellow, S. Christean Leaf, M. Dimopoulos, A.K. Kathleene Hazel. Darron Doom, Santiago Bur, S. Amalia Hailey, M.S. Oneal Grout, M. Delforge, R.F. Evalyn Casco, J.E. Hoffman, L.J. [place], Ronn Melena. Dennis Bast, M. Schreder, N. Meuleman, L. Zigmund Daniel, Domenic Moras, S. Judson Roch, R.L. Comenzo, Franky Macho, T. Illmer, P. Vlummens, C. Doyen, T. Carlis Stable, A. Perrot, K. Podar, Starleen Blue, S. Eden Emms, S. Tang, C. Picklesimer, J.-R. Saint-[place], M. Crochiere, H. Radene Knee, S. Alexia Freestone. Landesman, J. Clemon Chambers. Marvel Plan and [place] J Med [phone]. DOI: [id number]  BACKGROUND [name], a selective inhibitor of nuclear export compound that blocks exportin 1 (XPO1) and forces nuclear accumulation and activation of tumor suppressor proteins, inhibits nuclear factor ?B, and reduces oncoprotein messenger RNA translation, is a potential novel treatment for myeloma that is refractory to current therapeutic options. METHODS We administered oral selinexor (80 mg) plus dexamethasone (20 mg)  twice weekly to patients with myeloma who had previous exposure to bortezomib, carfilzomib, lenalidomide, pomalidomide, daratumumab, and an alkylating agent and had disease refractory to at least one proteasome inhibitor, one immunomodulatory agent, and daratumumab (triple-class refractory). The primary end point was overall response, defined as a partial response or better, with response assessed by an independent review committee. Clinical benefit, defined as a minimal response or better, was a secondary end point.  RESULTS A total of 122 patients in the [place] were included in the modified intention-to-treat population (primary analysis), and 123 were included in the safety population. The median age was 57 years, and the median number of previous regimens was 7; a total of 53% of the patients had high-risk cytogenetic abnormalities. A partial response or better was observed in 26% of patients (95% confidence interval, 19 to 35), including two stringent complete responses; 39% of patients had a minimal response or better. The median duration of response was 4.4 months, median progression-free survival was 3.7 months, and median overall survival was 8.6 months. Fatigue, nausea, and decreased appetite were common and were typically grade 1 or 2 (grade 3 events were noted in up to 25% of patients, and no grade 4 events were reported). Thrombocytopenia occurred in 73% of the patients (grade 3 in 25% and grade 4 in 33%). Thrombocytopenia led to bleeding events of grade 3 or higher in 6 patients. CONCLUSIONS [name]-dexamethasone resulted in objective treatment responses in patients with myeloma refractory to currently available therapies  Some of the short term side-effects included, though not limited to, risk of fatigue, weight loss, tumor lysis syndrome, risk of allergic reactions, hyperglycemia, pancytopenia, risk of blood clots or bleeding, life-threatening infections, need for  transfusions of blood products, admission to hospital for various reasons, and risks of death.   The  patient is aware that the response rates discussed earlier is not guaranteed.    After a long discussion, patient made an informed decision to proceed with the prescribed plan of care.   I am concerned about uncontrolled diabetes while on high-dose dexamethasone I recommend he makes appointment to see his primary care doctor for blood sugar monitoring and management of diabetes if needed I will get insurance prior authorization for [name] Based on prescription label, we would not need dose adjustment for renal impairment He will continue acyclovir for antimicrobial prophylaxis, along with calcium and vitamin D for bone health He is instructed to stop pomalidomide due to disease progression I am hopeful we will get prescription authorized and ready to start treatment next week I plan to see him prior to second week of treatment along with blood count monitoring

## 2019-03-02 MED ORDER — SELINEXOR (80 MG TWICE WEEKLY) 20 MG PO TBPK: 80.0000 mg | ORAL_TABLET | ORAL | 11 refills | Status: DC

## 2019-03-02 NOTE — Progress Notes (Signed)
Clinical notes successfully faxed to [employer] at [phone].

## 2019-03-04 ENCOUNTER — Telehealth: Payer: Self-pay

## 2019-03-04 NOTE — Telephone Encounter (Signed)
Oral Oncology Patient Advocate Encounter  Prior Authorization for Calvin Mccormick has been approved.    PA# [id number] Effective dates: 03/04/19 through 01/07/20  Oral Oncology Clinic will continue to follow.   [place] Patient [place] Phone [phone] Fax [phone] 03/04/2019 3:13 PM

## 2019-03-04 NOTE — Telephone Encounter (Signed)
Oral Oncology Patient Advocate Encounter  Received notification from [facility] that prior authorization for Xpovio is required.  PA submitted on CoverMyMeds Key [id number] Status is pending  Oral Oncology Clinic will continue to follow.   [place] Patient [place] Phone [phone] Fax [phone] 03/04/2019 3:12 PM

## 2019-03-06 ENCOUNTER — Other Ambulatory Visit: Payer: Self-pay | Admitting: Hematology and Oncology

## 2019-03-08 ENCOUNTER — Telehealth: Payer: Self-pay

## 2019-03-08 ENCOUNTER — Telehealth: Payer: Self-pay | Admitting: Hematology and Oncology

## 2019-03-08 NOTE — Telephone Encounter (Signed)
Called wife, per Dr. Alvy Bimler. call his wife and have the patient to start [name] tomorrow. Take on days 1 and 3 every week, that would be Tuesday and Thursdays for him. Remind them to take dexamethasone in the mornings on those days only. Dr. Alvy Bimler to send a scheduling msg to see him next week. Wife verbalized understanding. Instructed to call the office for questions.

## 2019-03-08 NOTE — Telephone Encounter (Signed)
Scheduled appt per 3/1 sch msg. Called pt on every number that we have on file. Was not able to leave a voicemail. Mailed a reminder letter and calender.

## 2019-03-12 ENCOUNTER — Telehealth: Payer: Self-pay

## 2019-03-12 NOTE — Telephone Encounter (Signed)
Wife called and left a message to call her with appt times.  Called back. She is asking when to start new medications, dexamethasone and [name]. Reviewed 3/1 telephone call with her. She misunderstood and ask me to tell her husband. Put on speaker phone and told to start next Tuesday 3/9, take dexamethasone, 5 tablets in am, every Tuesday/ Thursday with food. Take [name] every Tuesday and Thursday, starting 3/9. He verbalized understanding. Appt rescheduled to 3/16 at 0830 for labs, to bring wife to appt.

## 2019-03-13 ENCOUNTER — Other Ambulatory Visit: Payer: Self-pay | Admitting: Nurse Practitioner

## 2019-03-15 ENCOUNTER — Other Ambulatory Visit: Payer: Self-pay | Admitting: Hematology and Oncology

## 2019-03-15 ENCOUNTER — Ambulatory Visit: Payer: Medicare Other

## 2019-03-15 ENCOUNTER — Ambulatory Visit: Payer: Medicare Other | Admitting: Hematology and Oncology

## 2019-03-15 ENCOUNTER — Other Ambulatory Visit: Payer: Medicare Other

## 2019-03-15 DIAGNOSIS — C9002 Multiple myeloma in relapse: Secondary | ICD-10-CM

## 2019-03-16 ENCOUNTER — Inpatient Hospital Stay: Payer: Medicare Other

## 2019-03-16 ENCOUNTER — Inpatient Hospital Stay: Payer: Medicare Other | Admitting: Hematology and Oncology

## 2019-03-23 ENCOUNTER — Inpatient Hospital Stay: Payer: Medicare Other

## 2019-03-23 ENCOUNTER — Encounter: Payer: Self-pay | Admitting: Hematology and Oncology

## 2019-03-23 ENCOUNTER — Inpatient Hospital Stay: Payer: Medicare Other | Attending: Hematology and Oncology | Admitting: Hematology and Oncology

## 2019-03-23 ENCOUNTER — Other Ambulatory Visit: Payer: Self-pay

## 2019-03-23 VITALS — BP 131/51 | HR 84 | Temp 97.8°F | Resp 18 | Ht 69.0 in | Wt 157.5 lb

## 2019-03-23 DIAGNOSIS — K5730 Diverticulosis of large intestine without perforation or abscess without bleeding: Secondary | ICD-10-CM | POA: Insufficient documentation

## 2019-03-23 DIAGNOSIS — T50905A Adverse effect of unspecified drugs, medicaments and biological substances, initial encounter: Secondary | ICD-10-CM

## 2019-03-23 DIAGNOSIS — D631 Anemia in chronic kidney disease: Secondary | ICD-10-CM

## 2019-03-23 DIAGNOSIS — Z9221 Personal history of antineoplastic chemotherapy: Secondary | ICD-10-CM | POA: Diagnosis not present

## 2019-03-23 DIAGNOSIS — E876 Hypokalemia: Secondary | ICD-10-CM | POA: Diagnosis not present

## 2019-03-23 DIAGNOSIS — R531 Weakness: Secondary | ICD-10-CM | POA: Diagnosis not present

## 2019-03-23 DIAGNOSIS — K529 Noninfective gastroenteritis and colitis, unspecified: Secondary | ICD-10-CM | POA: Insufficient documentation

## 2019-03-23 DIAGNOSIS — K449 Diaphragmatic hernia without obstruction or gangrene: Secondary | ICD-10-CM | POA: Insufficient documentation

## 2019-03-23 DIAGNOSIS — Z7952 Long term (current) use of systemic steroids: Secondary | ICD-10-CM | POA: Diagnosis not present

## 2019-03-23 DIAGNOSIS — M8580 Other specified disorders of bone density and structure, unspecified site: Secondary | ICD-10-CM | POA: Diagnosis not present

## 2019-03-23 DIAGNOSIS — Z79899 Other long term (current) drug therapy: Secondary | ICD-10-CM | POA: Diagnosis not present

## 2019-03-23 DIAGNOSIS — N1831 Chronic kidney disease, stage 3a: Secondary | ICD-10-CM

## 2019-03-23 DIAGNOSIS — N183 Chronic kidney disease, stage 3 unspecified: Secondary | ICD-10-CM | POA: Diagnosis not present

## 2019-03-23 DIAGNOSIS — L02426 Furuncle of left lower limb: Secondary | ICD-10-CM | POA: Diagnosis not present

## 2019-03-23 DIAGNOSIS — R739 Hyperglycemia, unspecified: Secondary | ICD-10-CM

## 2019-03-23 DIAGNOSIS — M419 Scoliosis, unspecified: Secondary | ICD-10-CM | POA: Diagnosis not present

## 2019-03-23 DIAGNOSIS — L02416 Cutaneous abscess of left lower limb: Secondary | ICD-10-CM | POA: Diagnosis not present

## 2019-03-23 DIAGNOSIS — C9002 Multiple myeloma in relapse: Secondary | ICD-10-CM

## 2019-03-23 DIAGNOSIS — D539 Nutritional anemia, unspecified: Secondary | ICD-10-CM | POA: Insufficient documentation

## 2019-03-23 LAB — CBC WITH DIFFERENTIAL/PLATELET
Abs Immature Granulocytes: 0.43 10*3/uL — ABNORMAL HIGH (ref 0.00–0.07)
Basophils Absolute: 0 10*3/uL (ref 0.0–0.1)
Basophils Relative: 0 %
Eosinophils Absolute: 0 10*3/uL (ref 0.0–0.5)
Eosinophils Relative: 0 %
HCT: 24 % — ABNORMAL LOW (ref 39.0–52.0)
Hemoglobin: 8.2 g/dL — ABNORMAL LOW (ref 13.0–17.0)
Immature Granulocytes: 2 %
Lymphocytes Relative: 13 %
Lymphs Abs: 2.8 10*3/uL (ref 0.7–4.0)
MCH: 31.2 pg (ref 26.0–34.0)
MCHC: 34.2 g/dL (ref 30.0–36.0)
MCV: 91.3 fL (ref 80.0–100.0)
Monocytes Absolute: 0.2 10*3/uL (ref 0.1–1.0)
Monocytes Relative: 1 %
Neutro Abs: 18.2 10*3/uL — ABNORMAL HIGH (ref 1.7–7.7)
Neutrophils Relative %: 84 %
Platelets: 384 10*3/uL (ref 150–400)
RBC: 2.63 MIL/uL — ABNORMAL LOW (ref 4.22–5.81)
RDW: 16 % — ABNORMAL HIGH (ref 11.5–15.5)
WBC: 21.7 10*3/uL — ABNORMAL HIGH (ref 4.0–10.5)
nRBC: 0.4 % — ABNORMAL HIGH (ref 0.0–0.2)

## 2019-03-23 LAB — COMPREHENSIVE METABOLIC PANEL
ALT: 19 U/L (ref 0–44)
AST: 9 U/L — ABNORMAL LOW (ref 15–41)
Albumin: 3.2 g/dL — ABNORMAL LOW (ref 3.5–5.0)
Alkaline Phosphatase: 61 U/L (ref 38–126)
Anion gap: 16 — ABNORMAL HIGH (ref 5–15)
BUN: 27 mg/dL — ABNORMAL HIGH (ref 8–23)
CO2: 18 mmol/L — ABNORMAL LOW (ref 22–32)
Calcium: 7.7 mg/dL — ABNORMAL LOW (ref 8.9–10.3)
Chloride: 102 mmol/L (ref 98–111)
Creatinine, Ser: 1.91 mg/dL — ABNORMAL HIGH (ref 0.61–1.24)
GFR calc Af Amer: 39 mL/min — ABNORMAL LOW (ref >60)
GFR calc non Af Amer: 33 mL/min — ABNORMAL LOW (ref >60)
Glucose, Bld: 155 mg/dL — ABNORMAL HIGH (ref 70–99)
Potassium: 3.6 mmol/L (ref 3.5–5.1)
Sodium: 136 mmol/L (ref 135–145)
Total Bilirubin: 0.7 mg/dL (ref 0.3–1.2)
Total Protein: 7.7 g/dL (ref 6.5–8.1)

## 2019-03-23 NOTE — Assessment & Plan Note (Signed)
He has slight worsening renal dysfunction recently, could be due to disease progression He will continue to hydrate as tolerated at home

## 2019-03-23 NOTE — Assessment & Plan Note (Signed)
So far, he tolerated treatment well but he did develop severe anemia Due to recent difficulties in understanding of instructions, I plan to see him again next week for further follow-up I have given him written instruction and told his wife as well how to take his medications correctly He will take dexamethasone as well as his chemotherapy, [name] every Tuesdays and Thursdays

## 2019-03-23 NOTE — Assessment & Plan Note (Signed)
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

## 2019-03-23 NOTE — Progress Notes (Signed)
[place] OFFICE PROGRESS NOTE  Patient Care Team: Lucianne Lei, MD as PCP - General (Family Medicine) Heath Lark, MD as Consulting Physician (Hematology and Oncology) Beverely Pace, LCSW as Social Worker (General Practice)  ASSESSMENT & PLAN:  Multiple myeloma in relapse [facility]) So far, he tolerated treatment well but he did develop severe anemia Due to recent difficulties in understanding of instructions, I plan to see him again next week for further follow-up I have given him written instruction and told his wife as well how to take his medications correctly He will take dexamethasone as well as his chemotherapy, [name] every Tuesdays and Thursdays  Anemia in chronic renal disease He has multifactorial anemia due to treatment and chronic kidney disease He is not symptomatic Observe only  Chronic renal insufficiency, stage III (moderate) He has slight worsening renal dysfunction recently, could be due to disease progression He will continue to hydrate as tolerated at home  Drug-induced hyperglycemia He has significant elevated blood sugar likely induced by steroids I recommend close follow-up with his primary care doctor for medical management   Orders Placed This Encounter  Procedures  . Sample to Blood Bank    Standing Status:   Standing    Number of Occurrences:   33    Standing Expiration Date:   03/22/2020    All questions were answered. The patient knows to call the clinic with any problems, questions or concerns. The total time spent in the appointment was 20 minutes encounter with patients including review of chart and various tests results, discussions about plan of care and coordination of care plan   Heath Lark, MD 03/23/2019 4:14 PM  INTERVAL HISTORY: Please see below for problem oriented charting. He returns with his wife for further follow-up Upon questioning, he is found to be not taking his medications correctly He started taking  chemotherapy last week but not taking dexamethasone in the correct fashion No recent nausea No new bone pain Denies changes in bowel habits The patient denies any recent signs or symptoms of bleeding such as spontaneous epistaxis, hematuria or hematochezia.   SUMMARY OF ONCOLOGIC HISTORY: Oncology History Overview Note  Progressed on Elotuzumab, revlimid, Velcade, Daratumumab and Pomalyst   Multiple myeloma in relapse ([place])  12/12/2010 Initial Diagnosis   Multiple myeloma   02/16/2014 Imaging   Skeletal survey showed diffuse osteopenia   03/02/2014 Bone Marrow Biopsy   Accession: [id number] BM biopsy showed only 5 % plasma cells. However, the biopsy was difficult and the bone was fragmented during the procedure. Cytogenetics and FISH study is normal   03/03/2014 Procedure   he has port placement   03/10/2014 - 05/19/2014 Chemotherapy   he received elotuzumab and revlimid   05/20/2014 - 02/25/2016 Chemotherapy   Treatment is switched to maintenance Revlimid only. Treatment is stopped due to progressive disease   11/15/2014 Adverse Reaction   He has mild worsening anemia. Does of Revlimid reduced to 5 mg 21 days on, 7 days off   03/06/2016 -  Chemotherapy   He received Daratumumab and Velcade. Velcade is stopped on 07/24/16   03/06/2016 - 02/15/2019 Chemotherapy   The patient had daratumumab with Pomalyst for chemotherapy treatment.     08/14/2016 Miscellaneous   Pomalyst is added along with Daratumumab   10/17/2016 Surgery   EGD was performed for coffee-ground emesis. Multiple linear superficial ulcers were noted in the esophagus from 25-35 cm from insertion. Severe esophagitis was noted from 30-35 cm from insertion. Multiple biopsies were  obtained. A small hiatal hernia was noted. The gastric cavity contained coffee-ground fluid, after suctioning and lavage, no obvious gastric lesions/erosion/ulceration/erythema was noted. Biopsies were taken from the antrum to rule out H.  Pylori. Duodenum bulb appeared unremarkable. A small diverticulum was noted in second portion of the duodenum.     12/06/2016 Imaging   Ct scan abdomen No acute findings.  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  Stable small hiatal hernia.  Colonic diverticulosis, without radiographic evidence of diverticulitis.     REVIEW OF SYSTEMS:   Constitutional: Denies fevers, chills or abnormal weight loss Eyes: Denies blurriness of vision Ears, nose, mouth, throat, and face: Denies mucositis or sore throat Respiratory: Denies cough, dyspnea or wheezes Cardiovascular: Denies palpitation, chest discomfort or lower extremity swelling Gastrointestinal:  Denies nausea, heartburn or change in bowel habits Skin: Denies abnormal skin rashes Lymphatics: Denies new lymphadenopathy or easy bruising Neurological:Denies numbness, tingling or new weaknesses Behavioral/Psych: Mood is stable, no new changes  All other systems were reviewed with the patient and are negative.  I have reviewed the past medical history, past surgical history, social history and family history with the patient and they are unchanged from previous note.  ALLERGIES:  has No Known Allergies.  MEDICATIONS:  Current Outpatient Medications  Medication Sig Dispense Refill  . acyclovir (ZOVIRAX) 400 MG tablet Take 1 tablet (400 mg total) by mouth daily. 60 tablet 11  . allopurinol (ZYLOPRIM) 100 MG tablet Take 100 mg by mouth 2 (two) times daily.     Marland Kitchen amLODipine (NORVASC) 10 MG tablet Take 10 mg by mouth daily.  1  . aspirin EC 81 MG tablet Take 81 mg by mouth daily.    Marland Kitchen atorvastatin (LIPITOR) 20 MG tablet Take 20 mg by mouth at bedtime.     . brimonidine (ALPHAGAN) 0.2 % ophthalmic solution Place 1 drop into the right eye 3 (three) times daily.     . calcium carbonate (TUMS - DOSED IN MG ELEMENTAL CALCIUM) 500 MG chewable tablet Chew 1 tablet by mouth daily as needed for indigestion  or heartburn.    . cholecalciferol (VITAMIN D) 1000 UNITS tablet Take 1,000 Units by mouth daily.    Marland Kitchen dexamethasone (DECADRON) 4 MG tablet Take 5 tabs on days 1 and 3 every week, take with food, preferably in the morning 40 tablet 11  . dorzolamide-timolol (COSOPT) 22.3-6.8 MG/ML ophthalmic solution Place 1 drop into the right eye 2 (two) times daily.     . fluorometholone (FML) 0.1 % ophthalmic suspension Place 1 drop into the right eye daily.     Marland Kitchen latanoprost (XALATAN) 0.005 % ophthalmic solution Place 1 drop into the right eye at bedtime.    . lidocaine-prilocaine (EMLA) cream Apply to affected area once 30 g 3  . ondansetron (ZOFRAN) 8 MG tablet Take 1 tablet (8 mg total) by mouth every 8 (eight) hours as needed (Nausea or vomiting). 30 tablet 1  . pantoprazole (PROTONIX) 40 MG tablet TAKE 1 TABLET BY MOUTH EVERY DAY 90 tablet 5  . prochlorperazine (COMPAZINE) 10 MG tablet Take 1 tablet (10 mg total) by mouth every 6 (six) hours as needed for nausea or vomiting. 30 tablet 1  . [name], 80 MG Twice Weekly, 20 MG TBPK Take 80 mg by mouth 2 (two) times a week. Take 80 mg on days 1 and 3 every week 32 each 11   No current facility-administered medications for this visit.    PHYSICAL EXAMINATION: ECOG PERFORMANCE  STATUS: 1 - Symptomatic but completely ambulatory  Vitals:   03/23/19 0923  BP: (!) 131/51  Pulse: 84  Resp: 18  Temp: 97.8 F (36.6 C)  SpO2: 100%   Filed Weights   03/23/19 0923  Weight: 157 lb 8 oz (71.4 kg)    GENERAL:alert, no distress and comfortable NEURO: alert & oriented x 3 with fluent speech, no focal motor/sensory deficits  LABORATORY DATA:  I have reviewed the data as listed    Component Value Date/Time   NA 136 03/23/2019 0850   NA 140 01/09/2017 0913   K 3.6 03/23/2019 0850   K 3.7 01/09/2017 0913   CL 102 03/23/2019 0850   CL 107 06/29/2012 1131   CO2 18 (L) 03/23/2019 0850   CO2 21 (L) 01/09/2017 0913   GLUCOSE 155 (H) 03/23/2019 0850    GLUCOSE 80 01/09/2017 0913   GLUCOSE 143 (H) 06/29/2012 1131   BUN 27 (H) 03/23/2019 0850   BUN 9.8 01/09/2017 0913   CREATININE 1.91 (H) 03/23/2019 0850   CREATININE 1.85 (H) 07/06/2018 0935   CREATININE 1.5 (H) 01/09/2017 0913   CALCIUM 7.7 (L) 03/23/2019 0850   CALCIUM 7.5 (L) 01/09/2017 0913   PROT 7.7 03/23/2019 0850   PROT 5.7 (L) 01/09/2017 0913   ALBUMIN 3.2 (L) 03/23/2019 0850   ALBUMIN 2.9 (L) 01/09/2017 0913   AST 9 (L) 03/23/2019 0850   AST 14 (L) 07/06/2018 0935   AST 12 01/09/2017 0913   ALT 19 03/23/2019 0850   ALT 11 07/06/2018 0935   ALT 10 01/09/2017 0913   ALKPHOS 61 03/23/2019 0850   ALKPHOS 55 01/09/2017 0913   BILITOT 0.7 03/23/2019 0850   BILITOT 0.5 07/06/2018 0935   BILITOT 0.44 01/09/2017 0913   GFRNONAA 33 (L) 03/23/2019 0850   GFRNONAA 35 (L) 07/06/2018 0935   GFRAA 39 (L) 03/23/2019 0850   GFRAA 40 (L) 07/06/2018 0935    No results found for: SPEP, UPEP  Lab Results  Component Value Date   WBC 21.7 (H) 03/23/2019   NEUTROABS 18.2 (H) 03/23/2019   HGB 8.2 (L) 03/23/2019   HCT 24.0 (L) 03/23/2019   MCV 91.3 03/23/2019   PLT 384 03/23/2019      Chemistry      Component Value Date/Time   NA 136 03/23/2019 0850   NA 140 01/09/2017 0913   K 3.6 03/23/2019 0850   K 3.7 01/09/2017 0913   CL 102 03/23/2019 0850   CL 107 06/29/2012 1131   CO2 18 (L) 03/23/2019 0850   CO2 21 (L) 01/09/2017 0913   BUN 27 (H) 03/23/2019 0850   BUN 9.8 01/09/2017 0913   CREATININE 1.91 (H) 03/23/2019 0850   CREATININE 1.85 (H) 07/06/2018 0935   CREATININE 1.5 (H) 01/09/2017 0913      Component Value Date/Time   CALCIUM 7.7 (L) 03/23/2019 0850   CALCIUM 7.5 (L) 01/09/2017 0913   ALKPHOS 61 03/23/2019 0850   ALKPHOS 55 01/09/2017 0913   AST 9 (L) 03/23/2019 0850   AST 14 (L) 07/06/2018 0935   AST 12 01/09/2017 0913   ALT 19 03/23/2019 0850   ALT 11 07/06/2018 0935   ALT 10 01/09/2017 0913   BILITOT 0.7 03/23/2019 0850   BILITOT 0.5 07/06/2018 0935    BILITOT 0.44 01/09/2017 0913       RADIOGRAPHIC STUDIES: I have personally reviewed the radiological images as listed and agreed with the findings in the report. DG Bone Survey Met  Result Date: 02/23/2019  CLINICAL DATA:  Multiple myeloma.  In remission. EXAM: METASTATIC BONE SURVEY COMPARISON:  None. FINDINGS: No calvarial lytic lesion. No lytic lesion in the shoulder girdles or upper extremities. No lytic lesions cervical spine. There is multilevel endplate flowing osteophytosis with bony fusion of the vertebral bodies. Levoscoliosis spine. No lytic lesion identified in the ribs. Dense osteophytosis of the lumbar spine with scoliosis. Sclerotic lesion in the LEFT iliac wing appears benign. IMPRESSION: No lytic lesion on whole-body skeletal survey 2 suggest active multiple myeloma. Severe disc osteophytic disease of the cervical, thoracic and lumbar spine. Scoliosis of the lumbar spine. Electronically Signed   By: Suzy Bouchard M.D.   On: 02/23/2019 17:18

## 2019-03-23 NOTE — Assessment & Plan Note (Signed)
He has significant elevated blood sugar likely induced by steroids I recommend close follow-up with his primary care doctor for medical management

## 2019-03-24 LAB — KAPPA/LAMBDA LIGHT CHAINS
Kappa free light chain: 195.8 mg/L — ABNORMAL HIGH (ref 3.3–19.4)
Kappa, lambda light chain ratio: 55.94 — ABNORMAL HIGH (ref 0.26–1.65)
Lambda free light chains: 3.5 mg/L — ABNORMAL LOW (ref 5.7–26.3)

## 2019-03-25 ENCOUNTER — Telehealth: Payer: Self-pay | Admitting: Hematology and Oncology

## 2019-03-25 NOTE — Telephone Encounter (Signed)
Schedule per 3/16 sch msg. Called and spoke with pt, confirmed 3/22 appt

## 2019-03-26 LAB — MULTIPLE MYELOMA PANEL, SERUM
Albumin SerPl Elph-Mcnc: 3.2 g/dL (ref 2.9–4.4)
Albumin/Glob SerPl: 0.9 (ref 0.7–1.7)
Alpha 1: 0.4 g/dL (ref 0.0–0.4)
Alpha2 Glob SerPl Elph-Mcnc: 0.9 g/dL (ref 0.4–1.0)
B-Globulin SerPl Elph-Mcnc: 0.9 g/dL (ref 0.7–1.3)
Gamma Glob SerPl Elph-Mcnc: 1.8 g/dL (ref 0.4–1.8)
Globulin, Total: 4 g/dL — ABNORMAL HIGH (ref 2.2–3.9)
IgA: 25 mg/dL — ABNORMAL LOW (ref 61–437)
IgG (Immunoglobin G), Serum: 2077 mg/dL — ABNORMAL HIGH (ref 603–1613)
IgM (Immunoglobulin M), Srm: 5 mg/dL — ABNORMAL LOW (ref 15–143)
M Protein SerPl Elph-Mcnc: 1.6 g/dL — ABNORMAL HIGH
Total Protein ELP: 7.2 g/dL (ref 6.0–8.5)

## 2019-03-29 ENCOUNTER — Inpatient Hospital Stay (HOSPITAL_BASED_OUTPATIENT_CLINIC_OR_DEPARTMENT_OTHER): Payer: Medicare Other | Admitting: Hematology and Oncology

## 2019-03-29 ENCOUNTER — Encounter: Payer: Self-pay | Admitting: Hematology and Oncology

## 2019-03-29 ENCOUNTER — Telehealth: Payer: Self-pay

## 2019-03-29 ENCOUNTER — Inpatient Hospital Stay: Payer: Medicare Other

## 2019-03-29 ENCOUNTER — Other Ambulatory Visit: Payer: Self-pay

## 2019-03-29 ENCOUNTER — Other Ambulatory Visit: Payer: Self-pay | Admitting: Hematology and Oncology

## 2019-03-29 DIAGNOSIS — K449 Diaphragmatic hernia without obstruction or gangrene: Secondary | ICD-10-CM | POA: Diagnosis not present

## 2019-03-29 DIAGNOSIS — N183 Chronic kidney disease, stage 3 unspecified: Secondary | ICD-10-CM

## 2019-03-29 DIAGNOSIS — Z7952 Long term (current) use of systemic steroids: Secondary | ICD-10-CM | POA: Diagnosis not present

## 2019-03-29 DIAGNOSIS — D631 Anemia in chronic kidney disease: Secondary | ICD-10-CM | POA: Diagnosis not present

## 2019-03-29 DIAGNOSIS — Z79899 Other long term (current) drug therapy: Secondary | ICD-10-CM | POA: Diagnosis not present

## 2019-03-29 DIAGNOSIS — N1831 Chronic kidney disease, stage 3a: Secondary | ICD-10-CM | POA: Diagnosis not present

## 2019-03-29 DIAGNOSIS — M419 Scoliosis, unspecified: Secondary | ICD-10-CM | POA: Diagnosis not present

## 2019-03-29 DIAGNOSIS — C9002 Multiple myeloma in relapse: Secondary | ICD-10-CM

## 2019-03-29 DIAGNOSIS — R531 Weakness: Secondary | ICD-10-CM | POA: Diagnosis not present

## 2019-03-29 DIAGNOSIS — M8580 Other specified disorders of bone density and structure, unspecified site: Secondary | ICD-10-CM | POA: Diagnosis not present

## 2019-03-29 DIAGNOSIS — R197 Diarrhea, unspecified: Secondary | ICD-10-CM | POA: Diagnosis not present

## 2019-03-29 DIAGNOSIS — D539 Nutritional anemia, unspecified: Secondary | ICD-10-CM | POA: Diagnosis not present

## 2019-03-29 DIAGNOSIS — Z95828 Presence of other vascular implants and grafts: Secondary | ICD-10-CM

## 2019-03-29 DIAGNOSIS — Z9221 Personal history of antineoplastic chemotherapy: Secondary | ICD-10-CM | POA: Diagnosis not present

## 2019-03-29 DIAGNOSIS — L02416 Cutaneous abscess of left lower limb: Secondary | ICD-10-CM | POA: Diagnosis not present

## 2019-03-29 DIAGNOSIS — E876 Hypokalemia: Secondary | ICD-10-CM | POA: Diagnosis not present

## 2019-03-29 DIAGNOSIS — L02426 Furuncle of left lower limb: Secondary | ICD-10-CM | POA: Diagnosis not present

## 2019-03-29 DIAGNOSIS — K5730 Diverticulosis of large intestine without perforation or abscess without bleeding: Secondary | ICD-10-CM | POA: Diagnosis not present

## 2019-03-29 DIAGNOSIS — K529 Noninfective gastroenteritis and colitis, unspecified: Secondary | ICD-10-CM | POA: Diagnosis not present

## 2019-03-29 DIAGNOSIS — L02429 Furuncle of limb, unspecified: Secondary | ICD-10-CM

## 2019-03-29 DIAGNOSIS — R739 Hyperglycemia, unspecified: Secondary | ICD-10-CM | POA: Diagnosis not present

## 2019-03-29 LAB — CBC WITH DIFFERENTIAL/PLATELET
Abs Immature Granulocytes: 0.23 10*3/uL — ABNORMAL HIGH (ref 0.00–0.07)
Basophils Absolute: 0 10*3/uL (ref 0.0–0.1)
Basophils Relative: 0 %
Eosinophils Absolute: 0 10*3/uL (ref 0.0–0.5)
Eosinophils Relative: 0 %
HCT: 26.5 % — ABNORMAL LOW (ref 39.0–52.0)
Hemoglobin: 8.6 g/dL — ABNORMAL LOW (ref 13.0–17.0)
Immature Granulocytes: 1 %
Lymphocytes Relative: 17 %
Lymphs Abs: 2.9 10*3/uL (ref 0.7–4.0)
MCH: 30.5 pg (ref 26.0–34.0)
MCHC: 32.5 g/dL (ref 30.0–36.0)
MCV: 94 fL (ref 80.0–100.0)
Monocytes Absolute: 1.3 10*3/uL — ABNORMAL HIGH (ref 0.1–1.0)
Monocytes Relative: 7 %
Neutro Abs: 12.5 10*3/uL — ABNORMAL HIGH (ref 1.7–7.7)
Neutrophils Relative %: 75 %
Platelets: 226 10*3/uL (ref 150–400)
RBC: 2.82 MIL/uL — ABNORMAL LOW (ref 4.22–5.81)
RDW: 17 % — ABNORMAL HIGH (ref 11.5–15.5)
WBC: 16.9 10*3/uL — ABNORMAL HIGH (ref 4.0–10.5)
nRBC: 0 % (ref 0.0–0.2)

## 2019-03-29 LAB — COMPREHENSIVE METABOLIC PANEL
ALT: 23 U/L (ref 0–44)
AST: 15 U/L (ref 15–41)
Albumin: 2.7 g/dL — ABNORMAL LOW (ref 3.5–5.0)
Alkaline Phosphatase: 76 U/L (ref 38–126)
Anion gap: 13 (ref 5–15)
BUN: 15 mg/dL (ref 8–23)
CO2: 22 mmol/L (ref 22–32)
Calcium: 8.6 mg/dL — ABNORMAL LOW (ref 8.9–10.3)
Chloride: 104 mmol/L (ref 98–111)
Creatinine, Ser: 2.06 mg/dL — ABNORMAL HIGH (ref 0.61–1.24)
GFR calc Af Amer: 35 mL/min — ABNORMAL LOW (ref >60)
GFR calc non Af Amer: 30 mL/min — ABNORMAL LOW (ref >60)
Glucose, Bld: 110 mg/dL — ABNORMAL HIGH (ref 70–99)
Potassium: 3.7 mmol/L (ref 3.5–5.1)
Sodium: 139 mmol/L (ref 135–145)
Total Bilirubin: 0.8 mg/dL (ref 0.3–1.2)
Total Protein: 7 g/dL (ref 6.5–8.1)

## 2019-03-29 LAB — SAMPLE TO BLOOD BANK

## 2019-03-29 MED ORDER — SODIUM CHLORIDE 0.9 % IV SOLN
Freq: Once | INTRAVENOUS | Status: AC
  Filled 2019-03-29: qty 250

## 2019-03-29 MED ORDER — CEPHALEXIN 500 MG PO CAPS: 500.0000 mg | ORAL_CAPSULE | Freq: Two times a day (BID) | ORAL | 0 refills | Status: DC

## 2019-03-29 MED ORDER — HEPARIN SOD (PORK) LOCK FLUSH 100 UNIT/ML IV SOLN
500.0000 [IU] | Freq: Once | INTRAVENOUS | Status: AC
  Administered 2019-03-29: 500 [IU]
  Filled 2019-03-29: qty 5

## 2019-03-29 MED ORDER — SODIUM CHLORIDE 0.9% FLUSH
10.0000 mL | Freq: Once | INTRAVENOUS | Status: AC
  Administered 2019-03-29: 10 mL
  Filled 2019-03-29: qty 10

## 2019-03-29 NOTE — Assessment & Plan Note (Signed)
He has profound, generalized weakness likely secondary to infection as well as severe diarrhea We will manage his side effects aggressively He does not need transfusion support at this point I will reassess next week

## 2019-03-29 NOTE — Assessment & Plan Note (Signed)
He has significant history of chronic kidney disease With his diarrhea, I am concerned about risk of worsening renal failure I recommend daily IV fluids over the next few days and he agreed

## 2019-03-29 NOTE — Assessment & Plan Note (Signed)
He has diarrhea, secondary to side effects of chemotherapy His treatment will be placed on hold I recommend Imodium I recommend IV fluids daily due to recent dehydration and significant weight loss

## 2019-03-29 NOTE — Progress Notes (Signed)
[facility] OFFICE PROGRESS NOTE  Patient Care Team: Bland, Veita, MD as PCP - General (Family Medicine) Gorsuch, Ni, MD as Consulting Physician (Hematology and Oncology) Cunningham, Anne C, LCSW as Social Worker (General Practice)  ASSESSMENT & PLAN:  Multiple myeloma in relapse ([facility]) Unfortunately, he has significant side effects from treatment He has profound diarrhea and has lost a lot of weight since I saw him He also have a significant skin infection on his left thigh that needs to be drained I recommend he hold all his treatment until reassessment next week  Boil, leg He has a large skin boil on the left thigh that is draining when I saw him He is in pain That needs to be drained by surgical intervention I sent urgent request to be seen by general surgery this afternoon I recommend a course of antibiotics as well His chemotherapy will be placed on hold  Diarrhea He has diarrhea, secondary to side effects of chemotherapy His treatment will be placed on hold I recommend Imodium I recommend IV fluids daily due to recent dehydration and significant weight loss  Anemia in chronic renal disease He does not need blood transfusion today but we will monitor his blood counts closely  Chronic renal insufficiency, stage III (moderate) He has significant history of chronic kidney disease With his diarrhea, I am concerned about risk of worsening renal failure I recommend daily IV fluids over the next few days and he agreed  Generalized weakness He has profound, generalized weakness likely secondary to infection as well as severe diarrhea We will manage his side effects aggressively He does not need transfusion support at this point I will reassess next week   No orders of the defined types were placed in this encounter.   All questions were answered. The patient knows to call the clinic with any problems, questions or concerns. The total time spent in the  appointment was 40 minutes encounter with patients including review of chart and various tests results, discussions about plan of care and coordination of care plan   Ni Gorsuch, MD 03/29/2019 1:10 PM  INTERVAL HISTORY: Please see below for problem oriented charting. He returns with his wife for further follow-up His daughter attempted to connect on cell phone but I was not able to hear her well The patient stated he felt a lump on the thigh several days ago and it is not draining He has severe diarrhea over the weekend and felt weak The patient denies any recent signs or symptoms of bleeding such as spontaneous epistaxis, hematuria or hematochezia. He denies fever or chills  SUMMARY OF ONCOLOGIC HISTORY: Oncology History Overview Note  Progressed on Elotuzumab, revlimid, Velcade, Daratumumab and Pomalyst   Multiple myeloma in relapse ([facility])  12/12/2010 Initial Diagnosis   Multiple myeloma   02/16/2014 Imaging   Skeletal survey showed diffuse osteopenia   03/02/2014 Bone Marrow Biopsy   Accession: [id number] BM biopsy showed only 5 % plasma cells. However, the biopsy was difficult and the bone was fragmented during the procedure. Cytogenetics and FISH study is normal   03/03/2014 Procedure   he has port placement   03/10/2014 - 05/19/2014 Chemotherapy   he received elotuzumab and revlimid   05/20/2014 - 02/25/2016 Chemotherapy   Treatment is switched to maintenance Revlimid only. Treatment is stopped due to progressive disease   11/15/2014 Adverse Reaction   He has mild worsening anemia. Does of Revlimid reduced to 5 mg 21 days on, 7 days off     03/06/2016 -  Chemotherapy   He received Daratumumab and Velcade. Velcade is stopped on 07/24/16   03/06/2016 - 02/15/2019 Chemotherapy   The patient had daratumumab with Pomalyst for chemotherapy treatment.     08/14/2016 Miscellaneous   Pomalyst is added along with Daratumumab   10/17/2016 Surgery   EGD was performed for coffee-ground  emesis. Multiple linear superficial ulcers were noted in the esophagus from 25-35 cm from insertion. Severe esophagitis was noted from 30-35 cm from insertion. Multiple biopsies were obtained. A small hiatal hernia was noted. The gastric cavity contained coffee-ground fluid, after suctioning and lavage, no obvious gastric lesions/erosion/ulceration/erythema was noted. Biopsies were taken from the antrum to rule out H. Pylori. Duodenum bulb appeared unremarkable. A small diverticulum was noted in second portion of the duodenum.     12/06/2016 Imaging   Ct scan abdomen No acute findings.  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  Stable small hiatal hernia.  Colonic diverticulosis, without radiographic evidence of diverticulitis.     REVIEW OF SYSTEMS:   Constitutional: Denies fevers, chills or abnormal weight loss Eyes: Denies blurriness of vision Ears, nose, mouth, throat, and face: Denies mucositis or sore throat Respiratory: Denies cough, dyspnea or wheezes Cardiovascular: Denies palpitation, chest discomfort or lower extremity swelling Lymphatics: Denies new lymphadenopathy or easy bruising Behavioral/Psych: Mood is stable, no new changes  All other systems were reviewed with the patient and are negative.  I have reviewed the past medical history, past surgical history, social history and family history with the patient and they are unchanged from previous note.  ALLERGIES:  has No Known Allergies.  MEDICATIONS:  Current Outpatient Medications  Medication Sig Dispense Refill  . acyclovir (ZOVIRAX) 400 MG tablet Take 1 tablet (400 mg total) by mouth daily. 60 tablet 11  . allopurinol (ZYLOPRIM) 100 MG tablet Take 100 mg by mouth 2 (two) times daily.     . amLODipine (NORVASC) 10 MG tablet Take 10 mg by mouth daily.  1  . aspirin EC 81 MG tablet Take 81 mg by mouth daily.    . atorvastatin (LIPITOR) 20 MG tablet Take 20 mg by mouth at  bedtime.     . brimonidine (ALPHAGAN) 0.2 % ophthalmic solution Place 1 drop into the right eye 3 (three) times daily.     . calcium carbonate (TUMS - DOSED IN MG ELEMENTAL CALCIUM) 500 MG chewable tablet Chew 1 tablet by mouth daily as needed for indigestion or heartburn.    . cephALEXin (KEFLEX) 500 MG capsule Take 1 capsule (500 mg total) by mouth 2 (two) times daily. 14 capsule 0  . cholecalciferol (VITAMIN D) 1000 UNITS tablet Take 1,000 Units by mouth daily.    . dexamethasone (DECADRON) 4 MG tablet Take 5 tabs on days 1 and 3 every week, take with food, preferably in the morning 40 tablet 11  . dorzolamide-timolol (COSOPT) 22.3-6.8 MG/ML ophthalmic solution Place 1 drop into the right eye 2 (two) times daily.     . fluorometholone (FML) 0.1 % ophthalmic suspension Place 1 drop into the right eye daily.     . latanoprost (XALATAN) 0.005 % ophthalmic solution Place 1 drop into the right eye at bedtime.    . lidocaine-prilocaine (EMLA) cream Apply to affected area once 30 g 3  . ondansetron (ZOFRAN) 8 MG tablet Take 1 tablet (8 mg total) by mouth every 8 (eight) hours as needed (Nausea or vomiting). 30 tablet 1  . pantoprazole (PROTONIX) 40 MG tablet TAKE   1 TABLET BY MOUTH EVERY DAY 90 tablet 5  . prochlorperazine (COMPAZINE) 10 MG tablet Take 1 tablet (10 mg total) by mouth every 6 (six) hours as needed for nausea or vomiting. 30 tablet 1  . [name], 80 MG Twice Weekly, 20 MG TBPK Take 80 mg by mouth 2 (two) times a week. Take 80 mg on days 1 and 3 every week 32 each 11   No current facility-administered medications for this visit.    PHYSICAL EXAMINATION: ECOG PERFORMANCE STATUS: 2 - Symptomatic, <50% confined to bed  Vitals:   03/29/19 1130  BP: (!) 115/55  Pulse: 97  Resp: 18  Temp: 98.3 F (36.8 C)  SpO2: 100%   Filed Weights   03/29/19 1130  Weight: 150 lb (68 kg)    GENERAL:alert, no distress and comfortable.  Have lost a lot of weight and appears cachectic SKIN: A  very large skin boil is noted on the left thigh I put a clean dressing over it EYES: normal, Conjunctiva are pink and non-injected, sclera clear OROPHARYNX:no exudate, no erythema and lips, buccal mucosa, and tongue normal  NECK: supple, thyroid normal size, non-tender, without nodularity LYMPH:  no palpable lymphadenopathy in the cervical, axillary or inguinal LUNGS: clear to auscultation and percussion with normal breathing effort HEART: regular rate & rhythm and no murmurs and no lower extremity edema ABDOMEN:abdomen soft, non-tender and normal bowel sounds Musculoskeletal:no cyanosis of digits and no clubbing  NEURO: alert & oriented x 3 with fluent speech, no focal motor/sensory deficits  LABORATORY DATA:  I have reviewed the data as listed    Component Value Date/Time   NA 139 03/29/2019 1052   NA 140 01/09/2017 0913   K 3.7 03/29/2019 1052   K 3.7 01/09/2017 0913   CL 104 03/29/2019 1052   CL 107 06/29/2012 1131   CO2 22 03/29/2019 1052   CO2 21 (L) 01/09/2017 0913   GLUCOSE 110 (H) 03/29/2019 1052   GLUCOSE 80 01/09/2017 0913   GLUCOSE 143 (H) 06/29/2012 1131   BUN 15 03/29/2019 1052   BUN 9.8 01/09/2017 0913   CREATININE 2.06 (H) 03/29/2019 1052   CREATININE 1.85 (H) 07/06/2018 0935   CREATININE 1.5 (H) 01/09/2017 0913   CALCIUM 8.6 (L) 03/29/2019 1052   CALCIUM 7.5 (L) 01/09/2017 0913   PROT 7.0 03/29/2019 1052   PROT 5.7 (L) 01/09/2017 0913   ALBUMIN 2.7 (L) 03/29/2019 1052   ALBUMIN 2.9 (L) 01/09/2017 0913   AST 15 03/29/2019 1052   AST 14 (L) 07/06/2018 0935   AST 12 01/09/2017 0913   ALT 23 03/29/2019 1052   ALT 11 07/06/2018 0935   ALT 10 01/09/2017 0913   ALKPHOS 76 03/29/2019 1052   ALKPHOS 55 01/09/2017 0913   BILITOT 0.8 03/29/2019 1052   BILITOT 0.5 07/06/2018 0935   BILITOT 0.44 01/09/2017 0913   GFRNONAA 30 (L) 03/29/2019 1052   GFRNONAA 35 (L) 07/06/2018 0935   GFRAA 35 (L) 03/29/2019 1052   GFRAA 40 (L) 07/06/2018 0935    No results found  for: SPEP, UPEP  Lab Results  Component Value Date   WBC 16.9 (H) 03/29/2019   NEUTROABS 12.5 (H) 03/29/2019   HGB 8.6 (L) 03/29/2019   HCT 26.5 (L) 03/29/2019   MCV 94.0 03/29/2019   PLT 226 03/29/2019      Chemistry      Component Value Date/Time   NA 139 03/29/2019 1052   NA 140 01/09/2017 0913   K 3.7 03/29/2019 1052     K 3.7 01/09/2017 0913   CL 104 03/29/2019 1052   CL 107 06/29/2012 1131   CO2 22 03/29/2019 1052   CO2 21 (L) 01/09/2017 0913   BUN 15 03/29/2019 1052   BUN 9.8 01/09/2017 0913   CREATININE 2.06 (H) 03/29/2019 1052   CREATININE 1.85 (H) 07/06/2018 0935   CREATININE 1.5 (H) 01/09/2017 0913      Component Value Date/Time   CALCIUM 8.6 (L) 03/29/2019 1052   CALCIUM 7.5 (L) 01/09/2017 0913   ALKPHOS 76 03/29/2019 1052   ALKPHOS 55 01/09/2017 0913   AST 15 03/29/2019 1052   AST 14 (L) 07/06/2018 0935   AST 12 01/09/2017 0913   ALT 23 03/29/2019 1052   ALT 11 07/06/2018 0935   ALT 10 01/09/2017 0913   BILITOT 0.8 03/29/2019 1052   BILITOT 0.5 07/06/2018 0935   BILITOT 0.44 01/09/2017 0913

## 2019-03-29 NOTE — Patient Instructions (Signed)
Dehydration, Adult Dehydration is condition in which there is not enough water or other fluids in the body. This happens when a person loses more fluids than he or she takes in. Important body parts cannot work right without the right amount of fluids. Any loss of fluids from the body can cause dehydration. Dehydration can be mild, worse, or very bad. It should be treated right away to keep it from getting very bad. What are the causes? This condition may be caused by:  Conditions that cause loss of water or other fluids, such as: ? Watery poop (diarrhea). ? Vomiting. ? Sweating a lot. ? Peeing (urinating) a lot.  Not drinking enough fluids, especially when you: ? Are ill. ? Are doing things that take a lot of energy to do.  Other illnesses and conditions, such as fever or infection.  Certain medicines, such as medicines that take extra fluid out of the body (diuretics).  Lack of safe drinking water.  Not being able to get enough water and food. What increases the risk? The following factors may make you more likely to develop this condition:  Having a long-term (chronic) illness that has not been treated the right way, such as: ? Diabetes. ? Heart disease. ? Kidney disease.  Being 65 years of age or older.  Having a disability.  Living in a place that is high above the ground or sea (high in altitude). The thinner, dried air causes more fluid loss.  Doing exercises that put stress on your body for a long time. What are the signs or symptoms? Symptoms of dehydration depend on how bad it is. Mild or worse dehydration  Thirst.  Dry lips or dry mouth.  Feeling dizzy or light-headed, especially when you stand up from sitting.  Muscle cramps.  Your body making: ? Dark pee (urine). Pee may be the color of tea. ? Less pee than normal. ? Less tears than normal.  Headache. Very bad dehydration  Changes in skin. Skin may: ? Be cold to the touch (clammy). ? Be blotchy  or pale. ? Not go back to normal right after you lightly pinch it and let it go.  Little or no tears, pee, or sweat.  Changes in vital signs, such as: ? Fast breathing. ? Low blood pressure. ? Weak pulse. ? Pulse that is more than 100 beats a minute when you are sitting still.  Other changes, such as: ? Feeling very thirsty. ? Eyes that look hollow (sunken). ? Cold hands and feet. ? Being mixed up (confused). ? Being very tired (lethargic) or having trouble waking from sleep. ? Short-term weight loss. ? Loss of consciousness. How is this treated? Treatment for this condition depends on how bad it is. Treatment should start right away. Do not wait until your condition gets very bad. Very bad dehydration is an emergency. You will need to go to a hospital.  Mild or worse dehydration can be treated at home. You may be asked to: ? Drink more fluids. ? Drink an oral rehydration solution (ORS). This drink helps get the right amounts of fluids and salts and minerals in the blood (electrolytes).  Very bad dehydration can be treated: ? With fluids through an IV tube. ? By getting normal levels of salts and minerals in your blood. This is often done by giving salts and minerals through a tube. The tube is passed through your nose and into your stomach. ? By treating the root cause. Follow these instructions at   home: Oral rehydration solution If told by your doctor, drink an ORS:  Make an ORS. Use instructions on the package.  Start by drinking small amounts, about  cup (120 mL) every 5-10 minutes.  Slowly drink more until you have had the amount that your doctor said to have. Eating and drinking         Drink enough clear fluid to keep your pee pale yellow. If you were told to drink an ORS, finish the ORS first. Then, start slowly drinking other clear fluids. Drink fluids such as: ? Water. Do not drink only water. Doing that can make the salt (sodium) level in your body get too  low. ? Water from ice chips you suck on. ? Fruit juice that you have added water to (diluted). ? Low-calorie sports drinks.  Eat foods that have the right amounts of salts and minerals, such as: ? Bananas. ? Oranges. ? Potatoes. ? Tomatoes. ? Spinach.  Do not drink alcohol.  Avoid: ? Drinks that have a lot of sugar. These include:  High-calorie sports drinks.  Fruit juice that you did not add water to.  Soda.  Caffeine. ? Foods that are greasy or have a lot of fat or sugar. General instructions  Take over-the-counter and prescription medicines only as told by your doctor.  Do not take salt tablets. Doing that can make the salt level in your body get too high.  Return to your normal activities as told by your doctor. Ask your doctor what activities are safe for you.  Keep all follow-up visits as told by your doctor. This is important. Contact a doctor if:  You have pain in your belly (abdomen) and the pain: ? Gets worse. ? Stays in one place.  You have a rash.  You have a stiff neck.  You get angry or annoyed (irritable) more easily than normal.  You are more tired or have a harder time waking than normal.  You feel: ? Weak or dizzy. ? Very thirsty. Get help right away if you have:  Any symptoms of very bad dehydration.  Symptoms of vomiting, such as: ? You cannot eat or drink without vomiting. ? Your vomiting gets worse or does not go away. ? Your vomit has blood or green stuff in it.  Symptoms that get worse with treatment.  A fever.  A very bad headache.  Problems with peeing or pooping (having a bowel movement), such as: ? Watery poop that gets worse or does not go away. ? Blood in your poop (stool). This may cause poop to look black and tarry. ? Not peeing in 6-8 hours. ? Peeing only a small amount of very dark pee in 6-8 hours.  Trouble breathing. These symptoms may be an emergency. Do not wait to see if the symptoms will go away. Get  medical help right away. Call your local emergency services (911 in the [place] Do not drive yourself to the hospital. Summary  Dehydration is a condition in which there is not enough water or other fluids in the body. This happens when a person loses more fluids than he or she takes in.  Treatment for this condition depends on how bad it is. Treatment should be started right away. Do not wait until your condition gets very bad.  Drink enough clear fluid to keep your pee pale yellow. If you were told to drink an oral rehydration solution (ORS), finish the ORS first. Then, start slowly drinking other clear fluids.  Take over-the-counter and prescription medicines only as told by your doctor.  Get help right away if you have any symptoms of very bad dehydration. This information is not intended to replace advice given to you by your health care provider. Make sure you discuss any questions you have with your health care provider. Document Revised: 08/06/2018 Document Reviewed: 08/06/2018 Elsevier Patient Education  2020 [place] (COVID-19) Are you at risk?  Are you at risk for the Coronavirus (COVID-19)?  To be considered HIGH RISK for Coronavirus (COVID-19), you have to meet the following criteria:  . Traveled to [place], [place], [place], [place] or [place]; or in the [place] to [place], [place], [place], or [place]; and have fever, cough, and shortness of breath within the last 2 weeks of travel OR . Been in close contact with a person diagnosed with COVID-19 within the last 2 weeks and have fever, cough, and shortness of breath . IF YOU DO NOT MEET THESE CRITERIA, YOU ARE CONSIDERED LOW RISK FOR COVID-19.  What to do if you are HIGH RISK for COVID-19?  Marland Kitchen If you are having a medical emergency, call 911. . Seek medical care right away. Before you go to a doctor's office, urgent care or emergency department, call ahead and tell them about your recent travel,  contact with someone diagnosed with COVID-19, and your symptoms. You should receive instructions from your physician's office regarding next steps of care.  . When you arrive at healthcare provider, tell the healthcare staff immediately you have returned from visiting [place], [place], [place], [place] or [place]; or traveled in the [place] to [place], [place], [place], or [place]; in the last two weeks or you have been in close contact with a person diagnosed with COVID-19 in the last 2 weeks.   . Tell the health care staff about your symptoms: fever, cough and shortness of breath. . After you have been seen by a medical provider, you will be either: o Tested for (COVID-19) and discharged home on quarantine except to seek medical care if symptoms worsen, and asked to  - Stay home and avoid contact with others until you get your results (4-5 days)  - Avoid travel on public transportation if possible (such as bus, train, or airplane) or o Sent to the Emergency Department by EMS for evaluation, COVID-19 testing, and possible admission depending on your condition and test results.  What to do if you are LOW RISK for COVID-19?  Reduce your risk of any infection by using the same precautions used for avoiding the common cold or flu:  Marland Kitchen Wash your hands often with soap and warm water for at least 20 seconds.  If soap and water are not readily available, use an alcohol-based hand sanitizer with at least 60% alcohol.  . If coughing or sneezing, cover your mouth and nose by coughing or sneezing into the elbow areas of your shirt or coat, into a tissue or into your sleeve (not your hands). . Avoid shaking hands with others and consider head nods or verbal greetings only. . Avoid touching your eyes, nose, or mouth with unwashed hands.  . Avoid close contact with people who are sick. . Avoid places or events with large numbers of people in one location, like concerts or sporting events. . Carefully  consider travel plans you have or are making. . If you are planning any travel outside or inside the [place], visit the CDC's Travelers' Health webpage  for the latest health notices. . If you have some symptoms but not all symptoms, continue to monitor at home and seek medical attention if your symptoms worsen. . If you are having a medical emergency, call 911.   [facility] / e-Visit: [email or web address]         [facility]: [facility]: [id number]                   [facility]: [phone]

## 2019-03-29 NOTE — Assessment & Plan Note (Signed)
He has a large skin boil on the left thigh that is draining when I saw him He is in pain That needs to be drained by surgical intervention I sent urgent request to be seen by general surgery this afternoon I recommend a course of antibiotics as well His chemotherapy will be placed on hold

## 2019-03-29 NOTE — Assessment & Plan Note (Signed)
He does not need blood transfusion today but we will monitor his blood counts closely

## 2019-03-29 NOTE — Assessment & Plan Note (Signed)
Unfortunately, he has significant side effects from treatment He has profound diarrhea and has lost a lot of weight since I saw him He also have a significant skin infection on his left thigh that needs to be drained I recommend he hold all his treatment until reassessment next week

## 2019-03-29 NOTE — Telephone Encounter (Signed)
Called referral to [place] [facility] for boil to left thigh. Appt today at 4 pm at urgent care.   Wife called and given appt time of 4 pm today with address. She verbalized understanding.

## 2019-03-30 ENCOUNTER — Other Ambulatory Visit: Payer: Medicare Other

## 2019-03-30 ENCOUNTER — Ambulatory Visit: Payer: Medicare Other | Admitting: Hematology and Oncology

## 2019-03-30 ENCOUNTER — Inpatient Hospital Stay: Payer: Medicare Other

## 2019-03-30 ENCOUNTER — Other Ambulatory Visit: Payer: Self-pay

## 2019-03-30 VITALS — BP 136/57 | HR 75 | Temp 99.1°F | Resp 19

## 2019-03-30 DIAGNOSIS — E876 Hypokalemia: Secondary | ICD-10-CM | POA: Diagnosis not present

## 2019-03-30 DIAGNOSIS — K529 Noninfective gastroenteritis and colitis, unspecified: Secondary | ICD-10-CM | POA: Diagnosis not present

## 2019-03-30 DIAGNOSIS — M419 Scoliosis, unspecified: Secondary | ICD-10-CM | POA: Diagnosis not present

## 2019-03-30 DIAGNOSIS — R739 Hyperglycemia, unspecified: Secondary | ICD-10-CM | POA: Diagnosis not present

## 2019-03-30 DIAGNOSIS — K449 Diaphragmatic hernia without obstruction or gangrene: Secondary | ICD-10-CM | POA: Diagnosis not present

## 2019-03-30 DIAGNOSIS — C9002 Multiple myeloma in relapse: Secondary | ICD-10-CM | POA: Diagnosis not present

## 2019-03-30 DIAGNOSIS — L02426 Furuncle of left lower limb: Secondary | ICD-10-CM | POA: Diagnosis not present

## 2019-03-30 DIAGNOSIS — Z95828 Presence of other vascular implants and grafts: Secondary | ICD-10-CM

## 2019-03-30 DIAGNOSIS — D539 Nutritional anemia, unspecified: Secondary | ICD-10-CM | POA: Diagnosis not present

## 2019-03-30 DIAGNOSIS — M8580 Other specified disorders of bone density and structure, unspecified site: Secondary | ICD-10-CM | POA: Diagnosis not present

## 2019-03-30 DIAGNOSIS — Z79899 Other long term (current) drug therapy: Secondary | ICD-10-CM | POA: Diagnosis not present

## 2019-03-30 DIAGNOSIS — Z7952 Long term (current) use of systemic steroids: Secondary | ICD-10-CM | POA: Diagnosis not present

## 2019-03-30 DIAGNOSIS — N183 Chronic kidney disease, stage 3 unspecified: Secondary | ICD-10-CM | POA: Diagnosis not present

## 2019-03-30 DIAGNOSIS — Z9221 Personal history of antineoplastic chemotherapy: Secondary | ICD-10-CM | POA: Diagnosis not present

## 2019-03-30 DIAGNOSIS — N1831 Chronic kidney disease, stage 3a: Secondary | ICD-10-CM

## 2019-03-30 DIAGNOSIS — D631 Anemia in chronic kidney disease: Secondary | ICD-10-CM | POA: Diagnosis not present

## 2019-03-30 DIAGNOSIS — L02416 Cutaneous abscess of left lower limb: Secondary | ICD-10-CM | POA: Diagnosis not present

## 2019-03-30 DIAGNOSIS — K5730 Diverticulosis of large intestine without perforation or abscess without bleeding: Secondary | ICD-10-CM | POA: Diagnosis not present

## 2019-03-30 DIAGNOSIS — R531 Weakness: Secondary | ICD-10-CM | POA: Diagnosis not present

## 2019-03-30 MED ORDER — HEPARIN SOD (PORK) LOCK FLUSH 100 UNIT/ML IV SOLN
500.0000 [IU] | Freq: Once | INTRAVENOUS | Status: AC
  Administered 2019-03-30: 500 [IU]
  Filled 2019-03-30: qty 5

## 2019-03-30 MED ORDER — SODIUM CHLORIDE 0.9 % IV SOLN
Freq: Once | INTRAVENOUS | Status: AC
  Filled 2019-03-30: qty 250

## 2019-03-30 MED ORDER — SODIUM CHLORIDE 0.9% FLUSH
10.0000 mL | Freq: Once | INTRAVENOUS | Status: AC
  Administered 2019-03-30: 10 mL
  Filled 2019-03-30: qty 10

## 2019-03-30 NOTE — Patient Instructions (Signed)
COVID-19 Vaccine Information can be found at: [email or web address] For questions related to vaccine distribution or appointments, please email [email or web address] or call [phone].   Rehydration, Adult Rehydration is the replacement of body fluids and salts and minerals (electrolytes) that are lost during dehydration. Dehydration is when there is not enough fluid or water in the body. This happens when you lose more fluids than you take in. Common causes of dehydration include:  Vomiting.  Diarrhea.  Excessive sweating, such as from heat exposure or exercise.  Taking medicines that cause the body to lose excess fluid (diuretics).  Impaired kidney function.  Not drinking enough fluid.  Certain illnesses or infections.  Certain poorly controlled long-term (chronic) illnesses, such as diabetes, heart disease, and kidney disease.  Symptoms of mild dehydration may include thirst, dry lips and mouth, dry skin, and dizziness. Symptoms of severe dehydration may include increased heart rate, confusion, fainting, and not urinating. You can rehydrate by drinking certain fluids or getting fluids through an IV tube, as told by your health care provider. What are the risks? Generally, rehydration is safe. However, one problem that can happen is taking in too much fluid (overhydration). This is rare. If overhydration happens, it can cause an electrolyte imbalance, kidney failure, or a decrease in salt (sodium) levels in the body. How to rehydrate Follow instructions from your health care provider for rehydration. The kind of fluid you should drink and the amount you should drink depend on your condition.  If directed by your health care provider, drink an oral rehydration solution (ORS). This is a drink designed to treat dehydration that is found in pharmacies and retail stores. ? Make an ORS by following instructions on the  package. ? Start by drinking small amounts, about  cup (120 mL) every 5-10 minutes. ? Slowly increase how much you drink until you have taken the amount recommended by your health care provider.  Drink enough clear fluids to keep your urine clear or pale yellow. If you were instructed to drink an ORS, finish the ORS first, then start slowly drinking other clear fluids. Drink fluids such as: ? Water. Do not drink only water. Doing that can lead to having too little sodium in your body (hyponatremia). ? Ice chips. ? Fruit juice that you have added water to (diluted juice). ? Low-calorie sports drinks.  If you are severely dehydrated, your health care provider may recommend that you receive fluids through an IV tube in the hospital.  Do not take sodium tablets. Doing that can lead to the condition of having too much sodium in your body (hypernatremia). Eating while you rehydrate Follow instructions from your health care provider about what to eat while you rehydrate. Your health care provider may recommend that you slowly begin eating regular foods in small amounts.  Eat foods that contain a healthy balance of electrolytes, such as bananas, oranges, potatoes, tomatoes, and spinach.  Avoid foods that are greasy or contain a lot of fat or sugar.  In some cases, you may get nutrition through a feeding tube that is passed through your nose and into your stomach (nasogastric tube, or NG tube). This may be done if you have uncontrolled vomiting or diarrhea. Beverages to avoid Certain beverages may make dehydration worse. While you rehydrate, avoid:  Alcohol.  Caffeine.  Drinks that contain a lot of sugar. These include: ? High-calorie sports drinks. ? Fruit juice that is not diluted. ? Soda.  Check nutrition labels to see how much sugar or caffeine   a beverage contains. Signs of dehydration recovery You may be recovering from dehydration if:  You are urinating more often than before you  started rehydrating.  Your urine is clear or pale yellow.  Your energy level improves.  You vomit less frequently.  You have diarrhea less frequently.  Your appetite improves or returns to normal.  You feel less dizzy or less light-headed.  Your skin tone and color start to look more normal. Contact a health care provider if:  You continue to have symptoms of mild dehydration, such as: ? Thirst. ? Dry lips. ? Slightly dry mouth. ? Dry, warm skin. ? Dizziness.  You continue to vomit or have diarrhea. Get help right away if:  You have symptoms of dehydration that get worse.  You feel: ? Confused. ? Weak. ? Like you are going to faint.  You have not urinated in 6-8 hours.  You have very dark urine.  You have trouble breathing.  Your heart rate while sitting still is over 100 beats a minute.  You cannot drink fluids without vomiting.  You have vomiting or diarrhea that: ? Gets worse. ? Does not go away.  You have a fever. This information is not intended to replace advice given to you by your health care provider. Make sure you discuss any questions you have with your health care provider. Document Revised: 12/06/2016 Document Reviewed: 02/17/2015 Elsevier Patient Education  2020 [employer]  Coronavirus (COVID-19) Are you at risk?  Are you at risk for the Coronavirus (COVID-19)?  To be considered HIGH RISK for Coronavirus (COVID-19), you have to meet the following criteria:  . Traveled to [place], [place], [place], [place] or [place]; or in the [place] to [place], [place], [place], or [place]; and have fever, cough, and shortness of breath within the last 2 weeks of travel OR . Been in close contact with a person diagnosed with COVID-19 within the last 2 weeks and have fever, cough, and shortness of breath . IF YOU DO NOT MEET THESE CRITERIA, YOU ARE CONSIDERED LOW RISK FOR COVID-19.  What to do if you are HIGH RISK for COVID-19?  . If you  are having a medical emergency, call 911. . Seek medical care right away. Before you go to a doctor's office, urgent care or emergency department, call ahead and tell them about your recent travel, contact with someone diagnosed with COVID-19, and your symptoms. You should receive instructions from your physician's office regarding next steps of care.  . When you arrive at healthcare provider, tell the healthcare staff immediately you have returned from visiting [place], [place], [place], [place] or [place]; or traveled in the [place] to [place], [place], [place], or [place]; in the last two weeks or you have been in close contact with a person diagnosed with COVID-19 in the last 2 weeks.   . Tell the health care staff about your symptoms: fever, cough and shortness of breath. . After you have been seen by a medical provider, you will be either: o Tested for (COVID-19) and discharged home on quarantine except to seek medical care if symptoms worsen, and asked to  - Stay home and avoid contact with others until you get your results (4-5 days)  - Avoid travel on public transportation if possible (such as bus, train, or airplane) or o Sent to the Emergency Department by EMS for evaluation, COVID-19 testing, and possible admission depending on your condition and test results.  What   to do if you are LOW RISK for COVID-19?  Reduce your risk of any infection by using the same precautions used for avoiding the common cold or flu:  . Wash your hands often with soap and warm water for at least 20 seconds.  If soap and water are not readily available, use an alcohol-based hand sanitizer with at least 60% alcohol.  . If coughing or sneezing, cover your mouth and nose by coughing or sneezing into the elbow areas of your shirt or coat, into a tissue or into your sleeve (not your hands). . Avoid shaking hands with others and consider head nods or verbal greetings only. . Avoid touching your eyes, nose,  or mouth with unwashed hands.  . Avoid close contact with people who are sick. . Avoid places or events with large numbers of people in one location, like concerts or sporting events. . Carefully consider travel plans you have or are making. . If you are planning any travel outside or inside the [place], visit the CDC's Travelers' Health webpage for the latest health notices. . If you have some symptoms but not all symptoms, continue to monitor at home and seek medical attention if your symptoms worsen. . If you are having a medical emergency, call 911.   ADDITIONAL HEALTHCARE OPTIONS FOR PATIENTS  [facility] / e-Visit: [email or web address]         [facility]: [phone]  [facility]: [phone]                   [facility]: [phone]   

## 2019-03-31 ENCOUNTER — Other Ambulatory Visit: Payer: Self-pay

## 2019-03-31 ENCOUNTER — Inpatient Hospital Stay: Payer: Medicare Other

## 2019-03-31 VITALS — BP 131/59 | HR 52 | Temp 98.9°F | Resp 18

## 2019-03-31 DIAGNOSIS — D539 Nutritional anemia, unspecified: Secondary | ICD-10-CM | POA: Diagnosis not present

## 2019-03-31 DIAGNOSIS — E876 Hypokalemia: Secondary | ICD-10-CM | POA: Diagnosis not present

## 2019-03-31 DIAGNOSIS — R739 Hyperglycemia, unspecified: Secondary | ICD-10-CM | POA: Diagnosis not present

## 2019-03-31 DIAGNOSIS — K449 Diaphragmatic hernia without obstruction or gangrene: Secondary | ICD-10-CM | POA: Diagnosis not present

## 2019-03-31 DIAGNOSIS — Z9221 Personal history of antineoplastic chemotherapy: Secondary | ICD-10-CM | POA: Diagnosis not present

## 2019-03-31 DIAGNOSIS — L02416 Cutaneous abscess of left lower limb: Secondary | ICD-10-CM | POA: Diagnosis not present

## 2019-03-31 DIAGNOSIS — K5730 Diverticulosis of large intestine without perforation or abscess without bleeding: Secondary | ICD-10-CM | POA: Diagnosis not present

## 2019-03-31 DIAGNOSIS — C9002 Multiple myeloma in relapse: Secondary | ICD-10-CM

## 2019-03-31 DIAGNOSIS — K529 Noninfective gastroenteritis and colitis, unspecified: Secondary | ICD-10-CM | POA: Diagnosis not present

## 2019-03-31 DIAGNOSIS — Z7952 Long term (current) use of systemic steroids: Secondary | ICD-10-CM | POA: Diagnosis not present

## 2019-03-31 DIAGNOSIS — N183 Chronic kidney disease, stage 3 unspecified: Secondary | ICD-10-CM | POA: Diagnosis not present

## 2019-03-31 DIAGNOSIS — Z79899 Other long term (current) drug therapy: Secondary | ICD-10-CM | POA: Diagnosis not present

## 2019-03-31 DIAGNOSIS — M419 Scoliosis, unspecified: Secondary | ICD-10-CM | POA: Diagnosis not present

## 2019-03-31 DIAGNOSIS — D631 Anemia in chronic kidney disease: Secondary | ICD-10-CM | POA: Diagnosis not present

## 2019-03-31 DIAGNOSIS — R531 Weakness: Secondary | ICD-10-CM | POA: Diagnosis not present

## 2019-03-31 DIAGNOSIS — Z95828 Presence of other vascular implants and grafts: Secondary | ICD-10-CM

## 2019-03-31 DIAGNOSIS — M8580 Other specified disorders of bone density and structure, unspecified site: Secondary | ICD-10-CM | POA: Diagnosis not present

## 2019-03-31 DIAGNOSIS — N1831 Chronic kidney disease, stage 3a: Secondary | ICD-10-CM

## 2019-03-31 DIAGNOSIS — L02426 Furuncle of left lower limb: Secondary | ICD-10-CM | POA: Diagnosis not present

## 2019-03-31 MED ORDER — SODIUM CHLORIDE 0.9% FLUSH
10.0000 mL | INTRAVENOUS | Status: DC | PRN
  Administered 2019-03-31: 10 mL
  Filled 2019-03-31: qty 10

## 2019-03-31 MED ORDER — HEPARIN SOD (PORK) LOCK FLUSH 100 UNIT/ML IV SOLN
500.0000 [IU] | Freq: Once | INTRAVENOUS | Status: AC | PRN
  Administered 2019-03-31: 500 [IU]
  Filled 2019-03-31: qty 5

## 2019-03-31 MED ORDER — HEPARIN SOD (PORK) LOCK FLUSH 100 UNIT/ML IV SOLN
250.0000 [IU] | Freq: Once | INTRAVENOUS | Status: DC | PRN
  Filled 2019-03-31: qty 5

## 2019-03-31 MED ORDER — SODIUM CHLORIDE 0.9 % IV SOLN
Freq: Once | INTRAVENOUS | Status: AC
  Filled 2019-03-31: qty 250

## 2019-03-31 NOTE — Patient Instructions (Signed)

## 2019-04-01 ENCOUNTER — Other Ambulatory Visit: Payer: Self-pay

## 2019-04-01 ENCOUNTER — Inpatient Hospital Stay: Payer: Medicare Other

## 2019-04-01 VITALS — BP 128/64 | HR 61 | Temp 98.9°F | Resp 18

## 2019-04-01 DIAGNOSIS — Z9221 Personal history of antineoplastic chemotherapy: Secondary | ICD-10-CM | POA: Diagnosis not present

## 2019-04-01 DIAGNOSIS — D631 Anemia in chronic kidney disease: Secondary | ICD-10-CM | POA: Diagnosis not present

## 2019-04-01 DIAGNOSIS — Z79899 Other long term (current) drug therapy: Secondary | ICD-10-CM | POA: Diagnosis not present

## 2019-04-01 DIAGNOSIS — K529 Noninfective gastroenteritis and colitis, unspecified: Secondary | ICD-10-CM | POA: Diagnosis not present

## 2019-04-01 DIAGNOSIS — C9002 Multiple myeloma in relapse: Secondary | ICD-10-CM | POA: Diagnosis not present

## 2019-04-01 DIAGNOSIS — L02426 Furuncle of left lower limb: Secondary | ICD-10-CM | POA: Diagnosis not present

## 2019-04-01 DIAGNOSIS — D539 Nutritional anemia, unspecified: Secondary | ICD-10-CM | POA: Diagnosis not present

## 2019-04-01 DIAGNOSIS — M419 Scoliosis, unspecified: Secondary | ICD-10-CM | POA: Diagnosis not present

## 2019-04-01 DIAGNOSIS — R739 Hyperglycemia, unspecified: Secondary | ICD-10-CM | POA: Diagnosis not present

## 2019-04-01 DIAGNOSIS — Z7952 Long term (current) use of systemic steroids: Secondary | ICD-10-CM | POA: Diagnosis not present

## 2019-04-01 DIAGNOSIS — M8580 Other specified disorders of bone density and structure, unspecified site: Secondary | ICD-10-CM | POA: Diagnosis not present

## 2019-04-01 DIAGNOSIS — L02416 Cutaneous abscess of left lower limb: Secondary | ICD-10-CM | POA: Diagnosis not present

## 2019-04-01 DIAGNOSIS — E876 Hypokalemia: Secondary | ICD-10-CM | POA: Diagnosis not present

## 2019-04-01 DIAGNOSIS — N1831 Chronic kidney disease, stage 3a: Secondary | ICD-10-CM

## 2019-04-01 DIAGNOSIS — Z95828 Presence of other vascular implants and grafts: Secondary | ICD-10-CM

## 2019-04-01 DIAGNOSIS — R531 Weakness: Secondary | ICD-10-CM | POA: Diagnosis not present

## 2019-04-01 DIAGNOSIS — K449 Diaphragmatic hernia without obstruction or gangrene: Secondary | ICD-10-CM | POA: Diagnosis not present

## 2019-04-01 DIAGNOSIS — K5730 Diverticulosis of large intestine without perforation or abscess without bleeding: Secondary | ICD-10-CM | POA: Diagnosis not present

## 2019-04-01 DIAGNOSIS — N183 Chronic kidney disease, stage 3 unspecified: Secondary | ICD-10-CM | POA: Diagnosis not present

## 2019-04-01 MED ORDER — SODIUM CHLORIDE 0.9% FLUSH
10.0000 mL | Freq: Once | INTRAVENOUS | Status: AC
  Administered 2019-04-01: 10 mL
  Filled 2019-04-01: qty 10

## 2019-04-01 MED ORDER — SODIUM CHLORIDE 0.9% FLUSH
3.0000 mL | Freq: Once | INTRAVENOUS | Status: DC | PRN
  Filled 2019-04-01: qty 10

## 2019-04-01 MED ORDER — SODIUM CHLORIDE 0.9 % IV SOLN
Freq: Once | INTRAVENOUS | Status: AC
  Filled 2019-04-01: qty 250

## 2019-04-01 MED ORDER — ALTEPLASE 2 MG IJ SOLR
2.0000 mg | Freq: Once | INTRAMUSCULAR | Status: DC | PRN
  Filled 2019-04-01: qty 2

## 2019-04-01 MED ORDER — HEPARIN SOD (PORK) LOCK FLUSH 100 UNIT/ML IV SOLN
500.0000 [IU] | Freq: Once | INTRAVENOUS | Status: AC
  Administered 2019-04-01: 500 [IU]
  Filled 2019-04-01: qty 5

## 2019-04-01 NOTE — Patient Instructions (Signed)
COVID-19 Vaccine Information can be found at: [email or web address] For questions related to vaccine distribution or appointments, please email [email or web address] or call [phone].   Rehydration, Adult Rehydration is the replacement of body fluids and salts and minerals (electrolytes) that are lost during dehydration. Dehydration is when there is not enough fluid or water in the body. This happens when you lose more fluids than you take in. Common causes of dehydration include:  Vomiting.  Diarrhea.  Excessive sweating, such as from heat exposure or exercise.  Taking medicines that cause the body to lose excess fluid (diuretics).  Impaired kidney function.  Not drinking enough fluid.  Certain illnesses or infections.  Certain poorly controlled long-term (chronic) illnesses, such as diabetes, heart disease, and kidney disease.  Symptoms of mild dehydration may include thirst, dry lips and mouth, dry skin, and dizziness. Symptoms of severe dehydration may include increased heart rate, confusion, fainting, and not urinating. You can rehydrate by drinking certain fluids or getting fluids through an IV tube, as told by your health care provider. What are the risks? Generally, rehydration is safe. However, one problem that can happen is taking in too much fluid (overhydration). This is rare. If overhydration happens, it can cause an electrolyte imbalance, kidney failure, or a decrease in salt (sodium) levels in the body. How to rehydrate Follow instructions from your health care provider for rehydration. The kind of fluid you should drink and the amount you should drink depend on your condition.  If directed by your health care provider, drink an oral rehydration solution (ORS). This is a drink designed to treat dehydration that is found in pharmacies and retail stores. ? Make an ORS by following instructions on the  package. ? Start by drinking small amounts, about  cup (120 mL) every 5-10 minutes. ? Slowly increase how much you drink until you have taken the amount recommended by your health care provider.  Drink enough clear fluids to keep your urine clear or pale yellow. If you were instructed to drink an ORS, finish the ORS first, then start slowly drinking other clear fluids. Drink fluids such as: ? Water. Do not drink only water. Doing that can lead to having too little sodium in your body (hyponatremia). ? Ice chips. ? Fruit juice that you have added water to (diluted juice). ? Low-calorie sports drinks.  If you are severely dehydrated, your health care provider may recommend that you receive fluids through an IV tube in the hospital.  Do not take sodium tablets. Doing that can lead to the condition of having too much sodium in your body (hypernatremia). Eating while you rehydrate Follow instructions from your health care provider about what to eat while you rehydrate. Your health care provider may recommend that you slowly begin eating regular foods in small amounts.  Eat foods that contain a healthy balance of electrolytes, such as bananas, oranges, potatoes, tomatoes, and spinach.  Avoid foods that are greasy or contain a lot of fat or sugar.  In some cases, you may get nutrition through a feeding tube that is passed through your nose and into your stomach (nasogastric tube, or NG tube). This may be done if you have uncontrolled vomiting or diarrhea. Beverages to avoid Certain beverages may make dehydration worse. While you rehydrate, avoid:  Alcohol.  Caffeine.  Drinks that contain a lot of sugar. These include: ? High-calorie sports drinks. ? Fruit juice that is not diluted. ? Soda.  Check nutrition labels to see how much sugar or caffeine   a beverage contains. Signs of dehydration recovery You may be recovering from dehydration if:  You are urinating more often than before you  started rehydrating.  Your urine is clear or pale yellow.  Your energy level improves.  You vomit less frequently.  You have diarrhea less frequently.  Your appetite improves or returns to normal.  You feel less dizzy or less light-headed.  Your skin tone and color start to look more normal. Contact a health care provider if:  You continue to have symptoms of mild dehydration, such as: ? Thirst. ? Dry lips. ? Slightly dry mouth. ? Dry, warm skin. ? Dizziness.  You continue to vomit or have diarrhea. Get help right away if:  You have symptoms of dehydration that get worse.  You feel: ? Confused. ? Weak. ? Like you are going to faint.  You have not urinated in 6-8 hours.  You have very dark urine.  You have trouble breathing.  Your heart rate while sitting still is over 100 beats a minute.  You cannot drink fluids without vomiting.  You have vomiting or diarrhea that: ? Gets worse. ? Does not go away.  You have a fever. This information is not intended to replace advice given to you by your health care provider. Make sure you discuss any questions you have with your health care provider. Document Revised: 12/06/2016 Document Reviewed: 02/17/2015 Elsevier Patient Education  2020 [employer]  Coronavirus (COVID-19) Are you at risk?  Are you at risk for the Coronavirus (COVID-19)?  To be considered HIGH RISK for Coronavirus (COVID-19), you have to meet the following criteria:  . Traveled to [place], [place], [place], [place] or [place]; or in the [place] to [place], [place], [place], or [place]; and have fever, cough, and shortness of breath within the last 2 weeks of travel OR . Been in close contact with a person diagnosed with COVID-19 within the last 2 weeks and have fever, cough, and shortness of breath . IF YOU DO NOT MEET THESE CRITERIA, YOU ARE CONSIDERED LOW RISK FOR COVID-19.  What to do if you are HIGH RISK for COVID-19?  . If you  are having a medical emergency, call 911. . Seek medical care right away. Before you go to a doctor's office, urgent care or emergency department, call ahead and tell them about your recent travel, contact with someone diagnosed with COVID-19, and your symptoms. You should receive instructions from your physician's office regarding next steps of care.  . When you arrive at healthcare provider, tell the healthcare staff immediately you have returned from visiting [place], [place], [place], [place] or [place]; or traveled in the [place] to [place], [place], [place], or [place]; in the last two weeks or you have been in close contact with a person diagnosed with COVID-19 in the last 2 weeks.   . Tell the health care staff about your symptoms: fever, cough and shortness of breath. . After you have been seen by a medical provider, you will be either: o Tested for (COVID-19) and discharged home on quarantine except to seek medical care if symptoms worsen, and asked to  - Stay home and avoid contact with others until you get your results (4-5 days)  - Avoid travel on public transportation if possible (such as bus, train, or airplane) or o Sent to the Emergency Department by EMS for evaluation, COVID-19 testing, and possible admission depending on your condition and test results.  What   to do if you are LOW RISK for COVID-19?  Reduce your risk of any infection by using the same precautions used for avoiding the common cold or flu:  . Wash your hands often with soap and warm water for at least 20 seconds.  If soap and water are not readily available, use an alcohol-based hand sanitizer with at least 60% alcohol.  . If coughing or sneezing, cover your mouth and nose by coughing or sneezing into the elbow areas of your shirt or coat, into a tissue or into your sleeve (not your hands). . Avoid shaking hands with others and consider head nods or verbal greetings only. . Avoid touching your eyes, nose,  or mouth with unwashed hands.  . Avoid close contact with people who are sick. . Avoid places or events with large numbers of people in one location, like concerts or sporting events. . Carefully consider travel plans you have or are making. . If you are planning any travel outside or inside the [place], visit the CDC's Travelers' Health webpage for the latest health notices. . If you have some symptoms but not all symptoms, continue to monitor at home and seek medical attention if your symptoms worsen. . If you are having a medical emergency, call 911.   ADDITIONAL HEALTHCARE OPTIONS FOR PATIENTS  [facility] / e-Visit: [email or web address]         [facility]: [phone]  [facility]: [phone]                   [facility]: [phone]   

## 2019-04-02 ENCOUNTER — Inpatient Hospital Stay: Payer: Medicare Other

## 2019-04-02 ENCOUNTER — Other Ambulatory Visit: Payer: Self-pay

## 2019-04-02 VITALS — BP 140/62 | HR 65 | Temp 98.7°F | Resp 16

## 2019-04-02 DIAGNOSIS — M8580 Other specified disorders of bone density and structure, unspecified site: Secondary | ICD-10-CM | POA: Diagnosis not present

## 2019-04-02 DIAGNOSIS — C9002 Multiple myeloma in relapse: Secondary | ICD-10-CM | POA: Diagnosis not present

## 2019-04-02 DIAGNOSIS — M419 Scoliosis, unspecified: Secondary | ICD-10-CM | POA: Diagnosis not present

## 2019-04-02 DIAGNOSIS — L02426 Furuncle of left lower limb: Secondary | ICD-10-CM | POA: Diagnosis not present

## 2019-04-02 DIAGNOSIS — N183 Chronic kidney disease, stage 3 unspecified: Secondary | ICD-10-CM | POA: Diagnosis not present

## 2019-04-02 DIAGNOSIS — Z7952 Long term (current) use of systemic steroids: Secondary | ICD-10-CM | POA: Diagnosis not present

## 2019-04-02 DIAGNOSIS — K5730 Diverticulosis of large intestine without perforation or abscess without bleeding: Secondary | ICD-10-CM | POA: Diagnosis not present

## 2019-04-02 DIAGNOSIS — L02416 Cutaneous abscess of left lower limb: Secondary | ICD-10-CM | POA: Diagnosis not present

## 2019-04-02 DIAGNOSIS — R739 Hyperglycemia, unspecified: Secondary | ICD-10-CM | POA: Diagnosis not present

## 2019-04-02 DIAGNOSIS — K529 Noninfective gastroenteritis and colitis, unspecified: Secondary | ICD-10-CM | POA: Diagnosis not present

## 2019-04-02 DIAGNOSIS — K449 Diaphragmatic hernia without obstruction or gangrene: Secondary | ICD-10-CM | POA: Diagnosis not present

## 2019-04-02 DIAGNOSIS — Z95828 Presence of other vascular implants and grafts: Secondary | ICD-10-CM

## 2019-04-02 DIAGNOSIS — Z9221 Personal history of antineoplastic chemotherapy: Secondary | ICD-10-CM | POA: Diagnosis not present

## 2019-04-02 DIAGNOSIS — N1831 Chronic kidney disease, stage 3a: Secondary | ICD-10-CM

## 2019-04-02 DIAGNOSIS — D539 Nutritional anemia, unspecified: Secondary | ICD-10-CM | POA: Diagnosis not present

## 2019-04-02 DIAGNOSIS — E876 Hypokalemia: Secondary | ICD-10-CM | POA: Diagnosis not present

## 2019-04-02 DIAGNOSIS — D631 Anemia in chronic kidney disease: Secondary | ICD-10-CM | POA: Diagnosis not present

## 2019-04-02 DIAGNOSIS — Z79899 Other long term (current) drug therapy: Secondary | ICD-10-CM | POA: Diagnosis not present

## 2019-04-02 DIAGNOSIS — R531 Weakness: Secondary | ICD-10-CM | POA: Diagnosis not present

## 2019-04-02 MED ORDER — HEPARIN SOD (PORK) LOCK FLUSH 100 UNIT/ML IV SOLN
500.0000 [IU] | Freq: Once | INTRAVENOUS | Status: AC
  Administered 2019-04-02: 500 [IU]
  Filled 2019-04-02: qty 5

## 2019-04-02 MED ORDER — SODIUM CHLORIDE 0.9% FLUSH
10.0000 mL | Freq: Once | INTRAVENOUS | Status: AC
  Administered 2019-04-02: 10 mL
  Filled 2019-04-02: qty 10

## 2019-04-02 MED ORDER — SODIUM CHLORIDE 0.9 % IV SOLN
Freq: Once | INTRAVENOUS | Status: AC
  Filled 2019-04-02: qty 250

## 2019-04-02 NOTE — Patient Instructions (Signed)
Dehydration, Adult Dehydration is condition in which there is not enough water or other fluids in the body. This happens when a person loses more fluids than he or she takes in. Important body parts cannot work right without the right amount of fluids. Any loss of fluids from the body can cause dehydration. Dehydration can be mild, worse, or very bad. It should be treated right away to keep it from getting very bad. What are the causes? This condition may be caused by:  Conditions that cause loss of water or other fluids, such as: ? Watery poop (diarrhea). ? Vomiting. ? Sweating a lot. ? Peeing (urinating) a lot.  Not drinking enough fluids, especially when you: ? Are ill. ? Are doing things that take a lot of energy to do.  Other illnesses and conditions, such as fever or infection.  Certain medicines, such as medicines that take extra fluid out of the body (diuretics).  Lack of safe drinking water.  Not being able to get enough water and food. What increases the risk? The following factors may make you more likely to develop this condition:  Having a long-term (chronic) illness that has not been treated the right way, such as: ? Diabetes. ? Heart disease. ? Kidney disease.  Being 65 years of age or older.  Having a disability.  Living in a place that is high above the ground or sea (high in altitude). The thinner, dried air causes more fluid loss.  Doing exercises that put stress on your body for a long time. What are the signs or symptoms? Symptoms of dehydration depend on how bad it is. Mild or worse dehydration  Thirst.  Dry lips or dry mouth.  Feeling dizzy or light-headed, especially when you stand up from sitting.  Muscle cramps.  Your body making: ? Dark pee (urine). Pee may be the color of tea. ? Less pee than normal. ? Less tears than normal.  Headache. Very bad dehydration  Changes in skin. Skin may: ? Be cold to the touch (clammy). ? Be blotchy  or pale. ? Not go back to normal right after you lightly pinch it and let it go.  Little or no tears, pee, or sweat.  Changes in vital signs, such as: ? Fast breathing. ? Low blood pressure. ? Weak pulse. ? Pulse that is more than 100 beats a minute when you are sitting still.  Other changes, such as: ? Feeling very thirsty. ? Eyes that look hollow (sunken). ? Cold hands and feet. ? Being mixed up (confused). ? Being very tired (lethargic) or having trouble waking from sleep. ? Short-term weight loss. ? Loss of consciousness. How is this treated? Treatment for this condition depends on how bad it is. Treatment should start right away. Do not wait until your condition gets very bad. Very bad dehydration is an emergency. You will need to go to a hospital.  Mild or worse dehydration can be treated at home. You may be asked to: ? Drink more fluids. ? Drink an oral rehydration solution (ORS). This drink helps get the right amounts of fluids and salts and minerals in the blood (electrolytes).  Very bad dehydration can be treated: ? With fluids through an IV tube. ? By getting normal levels of salts and minerals in your blood. This is often done by giving salts and minerals through a tube. The tube is passed through your nose and into your stomach. ? By treating the root cause. Follow these instructions at   home: Oral rehydration solution If told by your doctor, drink an ORS:  Make an ORS. Use instructions on the package.  Start by drinking small amounts, about  cup (120 mL) every 5-10 minutes.  Slowly drink more until you have had the amount that your doctor said to have. Eating and drinking         Drink enough clear fluid to keep your pee pale yellow. If you were told to drink an ORS, finish the ORS first. Then, start slowly drinking other clear fluids. Drink fluids such as: ? Water. Do not drink only water. Doing that can make the salt (sodium) level in your body get too  low. ? Water from ice chips you suck on. ? Fruit juice that you have added water to (diluted). ? Low-calorie sports drinks.  Eat foods that have the right amounts of salts and minerals, such as: ? Bananas. ? Oranges. ? Potatoes. ? Tomatoes. ? Spinach.  Do not drink alcohol.  Avoid: ? Drinks that have a lot of sugar. These include:  High-calorie sports drinks.  Fruit juice that you did not add water to.  Soda.  Caffeine. ? Foods that are greasy or have a lot of fat or sugar. General instructions  Take over-the-counter and prescription medicines only as told by your doctor.  Do not take salt tablets. Doing that can make the salt level in your body get too high.  Return to your normal activities as told by your doctor. Ask your doctor what activities are safe for you.  Keep all follow-up visits as told by your doctor. This is important. Contact a doctor if:  You have pain in your belly (abdomen) and the pain: ? Gets worse. ? Stays in one place.  You have a rash.  You have a stiff neck.  You get angry or annoyed (irritable) more easily than normal.  You are more tired or have a harder time waking than normal.  You feel: ? Weak or dizzy. ? Very thirsty. Get help right away if you have:  Any symptoms of very bad dehydration.  Symptoms of vomiting, such as: ? You cannot eat or drink without vomiting. ? Your vomiting gets worse or does not go away. ? Your vomit has blood or green stuff in it.  Symptoms that get worse with treatment.  A fever.  A very bad headache.  Problems with peeing or pooping (having a bowel movement), such as: ? Watery poop that gets worse or does not go away. ? Blood in your poop (stool). This may cause poop to look black and tarry. ? Not peeing in 6-8 hours. ? Peeing only a small amount of very dark pee in 6-8 hours.  Trouble breathing. These symptoms may be an emergency. Do not wait to see if the symptoms will go away. Get  medical help right away. Call your local emergency services (911 in the [place] Do not drive yourself to the hospital. Summary  Dehydration is a condition in which there is not enough water or other fluids in the body. This happens when a person loses more fluids than he or she takes in.  Treatment for this condition depends on how bad it is. Treatment should be started right away. Do not wait until your condition gets very bad.  Drink enough clear fluid to keep your pee pale yellow. If you were told to drink an oral rehydration solution (ORS), finish the ORS first. Then, start slowly drinking other clear fluids.  Take over-the-counter and prescription medicines only as told by your doctor.  Get help right away if you have any symptoms of very bad dehydration. This information is not intended to replace advice given to you by your health care provider. Make sure you discuss any questions you have with your health care provider. Document Revised: 08/06/2018 Document Reviewed: 08/06/2018 Elsevier Patient Education  2020 [place] (COVID-19) Are you at risk?  Are you at risk for the Coronavirus (COVID-19)?  To be considered HIGH RISK for Coronavirus (COVID-19), you have to meet the following criteria:  . Traveled to [place], [place], [place], [place] or [place]; or in the [place] to [place], [place], [place], or [place]; and have fever, cough, and shortness of breath within the last 2 weeks of travel OR . Been in close contact with a person diagnosed with COVID-19 within the last 2 weeks and have fever, cough, and shortness of breath . IF YOU DO NOT MEET THESE CRITERIA, YOU ARE CONSIDERED LOW RISK FOR COVID-19.  What to do if you are HIGH RISK for COVID-19?  Marland Kitchen If you are having a medical emergency, call 911. . Seek medical care right away. Before you go to a doctor's office, urgent care or emergency department, call ahead and tell them about your recent travel,  contact with someone diagnosed with COVID-19, and your symptoms. You should receive instructions from your physician's office regarding next steps of care.  . When you arrive at healthcare provider, tell the healthcare staff immediately you have returned from visiting [place], [place], [place], [place] or [place]; or traveled in the [place] to [place], [place], [place], or [place]; in the last two weeks or you have been in close contact with a person diagnosed with COVID-19 in the last 2 weeks.   . Tell the health care staff about your symptoms: fever, cough and shortness of breath. . After you have been seen by a medical provider, you will be either: o Tested for (COVID-19) and discharged home on quarantine except to seek medical care if symptoms worsen, and asked to  - Stay home and avoid contact with others until you get your results (4-5 days)  - Avoid travel on public transportation if possible (such as bus, train, or airplane) or o Sent to the Emergency Department by EMS for evaluation, COVID-19 testing, and possible admission depending on your condition and test results.  What to do if you are LOW RISK for COVID-19?  Reduce your risk of any infection by using the same precautions used for avoiding the common cold or flu:  Marland Kitchen Wash your hands often with soap and warm water for at least 20 seconds.  If soap and water are not readily available, use an alcohol-based hand sanitizer with at least 60% alcohol.  . If coughing or sneezing, cover your mouth and nose by coughing or sneezing into the elbow areas of your shirt or coat, into a tissue or into your sleeve (not your hands). . Avoid shaking hands with others and consider head nods or verbal greetings only. . Avoid touching your eyes, nose, or mouth with unwashed hands.  . Avoid close contact with people who are sick. . Avoid places or events with large numbers of people in one location, like concerts or sporting events. . Carefully  consider travel plans you have or are making. . If you are planning any travel outside or inside the [place], visit the CDC's Travelers' Health  webpage for the latest health notices. . If you have some symptoms but not all symptoms, continue to monitor at home and seek medical attention if your symptoms worsen. . If you are having a medical emergency, call 911.   [facility] / e-Visit: [email or web address]         [facility]: [facility]: [id number]                   [facility]: [phone]

## 2019-04-05 ENCOUNTER — Telehealth: Payer: Self-pay

## 2019-04-05 ENCOUNTER — Ambulatory Visit: Payer: Medicare Other

## 2019-04-05 ENCOUNTER — Ambulatory Visit: Payer: Medicare Other | Admitting: Hematology and Oncology

## 2019-04-05 NOTE — Telephone Encounter (Signed)
Called wife with today's appt. He has a 1 pm appt with surgeon today. Appts to be rescheduled to tomorrow.

## 2019-04-05 NOTE — Telephone Encounter (Signed)
Called and given appt times for tomorrow at 8:15 for lab. MD and infusion. They both verbalized understanding.

## 2019-04-06 ENCOUNTER — Other Ambulatory Visit: Payer: Self-pay | Admitting: Hematology and Oncology

## 2019-04-06 ENCOUNTER — Other Ambulatory Visit: Payer: Self-pay

## 2019-04-06 ENCOUNTER — Inpatient Hospital Stay: Payer: Medicare Other

## 2019-04-06 ENCOUNTER — Encounter: Payer: Self-pay | Admitting: Hematology and Oncology

## 2019-04-06 ENCOUNTER — Inpatient Hospital Stay (HOSPITAL_BASED_OUTPATIENT_CLINIC_OR_DEPARTMENT_OTHER): Payer: Medicare Other | Admitting: Hematology and Oncology

## 2019-04-06 DIAGNOSIS — C9002 Multiple myeloma in relapse: Secondary | ICD-10-CM

## 2019-04-06 DIAGNOSIS — N1831 Chronic kidney disease, stage 3a: Secondary | ICD-10-CM | POA: Diagnosis not present

## 2019-04-06 DIAGNOSIS — K449 Diaphragmatic hernia without obstruction or gangrene: Secondary | ICD-10-CM | POA: Diagnosis not present

## 2019-04-06 DIAGNOSIS — K529 Noninfective gastroenteritis and colitis, unspecified: Secondary | ICD-10-CM | POA: Diagnosis not present

## 2019-04-06 DIAGNOSIS — Z9221 Personal history of antineoplastic chemotherapy: Secondary | ICD-10-CM | POA: Diagnosis not present

## 2019-04-06 DIAGNOSIS — D631 Anemia in chronic kidney disease: Secondary | ICD-10-CM | POA: Diagnosis not present

## 2019-04-06 DIAGNOSIS — D539 Nutritional anemia, unspecified: Secondary | ICD-10-CM

## 2019-04-06 DIAGNOSIS — N183 Chronic kidney disease, stage 3 unspecified: Secondary | ICD-10-CM | POA: Diagnosis not present

## 2019-04-06 DIAGNOSIS — R739 Hyperglycemia, unspecified: Secondary | ICD-10-CM | POA: Diagnosis not present

## 2019-04-06 DIAGNOSIS — Z7952 Long term (current) use of systemic steroids: Secondary | ICD-10-CM | POA: Diagnosis not present

## 2019-04-06 DIAGNOSIS — E876 Hypokalemia: Secondary | ICD-10-CM

## 2019-04-06 DIAGNOSIS — Z79899 Other long term (current) drug therapy: Secondary | ICD-10-CM | POA: Diagnosis not present

## 2019-04-06 DIAGNOSIS — M419 Scoliosis, unspecified: Secondary | ICD-10-CM | POA: Diagnosis not present

## 2019-04-06 DIAGNOSIS — L02416 Cutaneous abscess of left lower limb: Secondary | ICD-10-CM | POA: Diagnosis not present

## 2019-04-06 DIAGNOSIS — R531 Weakness: Secondary | ICD-10-CM | POA: Diagnosis not present

## 2019-04-06 DIAGNOSIS — L02426 Furuncle of left lower limb: Secondary | ICD-10-CM | POA: Diagnosis not present

## 2019-04-06 DIAGNOSIS — M8580 Other specified disorders of bone density and structure, unspecified site: Secondary | ICD-10-CM | POA: Diagnosis not present

## 2019-04-06 DIAGNOSIS — K5730 Diverticulosis of large intestine without perforation or abscess without bleeding: Secondary | ICD-10-CM | POA: Diagnosis not present

## 2019-04-06 LAB — CBC WITH DIFFERENTIAL/PLATELET
Abs Immature Granulocytes: 0.1 10*3/uL — ABNORMAL HIGH (ref 0.00–0.07)
Basophils Absolute: 0 10*3/uL (ref 0.0–0.1)
Basophils Relative: 0 %
Eosinophils Absolute: 0.1 10*3/uL (ref 0.0–0.5)
Eosinophils Relative: 1 %
HCT: 26.5 % — ABNORMAL LOW (ref 39.0–52.0)
Hemoglobin: 8.5 g/dL — ABNORMAL LOW (ref 13.0–17.0)
Immature Granulocytes: 1 %
Lymphocytes Relative: 45 %
Lymphs Abs: 5.8 10*3/uL — ABNORMAL HIGH (ref 0.7–4.0)
MCH: 30.2 pg (ref 26.0–34.0)
MCHC: 32.1 g/dL (ref 30.0–36.0)
MCV: 94.3 fL (ref 80.0–100.0)
Monocytes Absolute: 0.7 10*3/uL (ref 0.1–1.0)
Monocytes Relative: 5 %
Neutro Abs: 6.3 10*3/uL (ref 1.7–7.7)
Neutrophils Relative %: 48 %
Platelets: 125 10*3/uL — ABNORMAL LOW (ref 150–400)
RBC: 2.81 MIL/uL — ABNORMAL LOW (ref 4.22–5.81)
RDW: 17.2 % — ABNORMAL HIGH (ref 11.5–15.5)
WBC: 13 10*3/uL — ABNORMAL HIGH (ref 4.0–10.5)
nRBC: 0 % (ref 0.0–0.2)

## 2019-04-06 LAB — COMPREHENSIVE METABOLIC PANEL
ALT: 14 U/L (ref 0–44)
AST: 13 U/L — ABNORMAL LOW (ref 15–41)
Albumin: 3 g/dL — ABNORMAL LOW (ref 3.5–5.0)
Alkaline Phosphatase: 70 U/L (ref 38–126)
Anion gap: 13 (ref 5–15)
BUN: 13 mg/dL (ref 8–23)
CO2: 15 mmol/L — ABNORMAL LOW (ref 22–32)
Calcium: 8.4 mg/dL — ABNORMAL LOW (ref 8.9–10.3)
Chloride: 107 mmol/L (ref 98–111)
Creatinine, Ser: 1.8 mg/dL — ABNORMAL HIGH (ref 0.61–1.24)
GFR calc Af Amer: 41 mL/min — ABNORMAL LOW (ref >60)
GFR calc non Af Amer: 36 mL/min — ABNORMAL LOW (ref >60)
Glucose, Bld: 139 mg/dL — ABNORMAL HIGH (ref 70–99)
Potassium: 2.8 mmol/L — CL (ref 3.5–5.1)
Sodium: 135 mmol/L (ref 135–145)
Total Bilirubin: 0.8 mg/dL (ref 0.3–1.2)
Total Protein: 7.5 g/dL (ref 6.5–8.1)

## 2019-04-06 LAB — SAMPLE TO BLOOD BANK

## 2019-04-06 MED ORDER — POTASSIUM CHLORIDE CRYS ER 20 MEQ PO TBCR: 20.0000 meq | EXTENDED_RELEASE_TABLET | Freq: Two times a day (BID) | ORAL | 0 refills | Status: DC

## 2019-04-06 NOTE — Assessment & Plan Note (Signed)
His renal function has improved with aggressive hydration support I recommend he takes Imodium to avoid risk of acute renal failure

## 2019-04-06 NOTE — Assessment & Plan Note (Signed)
His diarrhea has improved since discontinuation of chemotherapy After very prolonged discussion, I recommend the patient to take Imodium on a regular basis to avoid dehydration and severe diarrhea If his diarrhea gets worse again next week, I plan to reduce the dose of chemotherapy

## 2019-04-06 NOTE — Progress Notes (Signed)
[facility] OFFICE PROGRESS NOTE  Patient Care Team: Lucianne Lei, MD as PCP - General (Family Medicine) Heath Lark, MD as Consulting Physician (Hematology and Oncology) Beverely Pace, LCSW as Social Worker (General Practice)  ASSESSMENT & PLAN:  Multiple myeloma in relapse [facility]) His diarrhea has subsided His skin infection has resolved We will resume chemotherapy today I will see him next week for further follow-up and supportive care   Chronic diarrhea His diarrhea has improved since discontinuation of chemotherapy After very prolonged discussion, I recommend the patient to take Imodium on a regular basis to avoid dehydration and severe diarrhea If his diarrhea gets worse again next week, I plan to reduce the dose of chemotherapy  Chronic renal insufficiency, stage III (moderate) His renal function has improved with aggressive hydration support I recommend he takes Imodium to avoid risk of acute renal failure  Hypokalemia He has hypokalemia secondary to GI loss I recommend potassium replacement therapy  Deficiency anemia He has multifactorial anemia His blood count is stable He does not need transfusion support   No orders of the defined types were placed in this encounter.   All questions were answered. The patient knows to call the clinic with any problems, questions or concerns. The total time spent in the appointment was 30 minutes encounter with patients including review of chart and various tests results, discussions about plan of care and coordination of care plan   Heath Lark, MD 04/06/2019 9:13 AM  INTERVAL HISTORY: Please see below for problem oriented charting. He returns with his wife today The patient showed me a pill bottle filled with different pills He has completed a course of antibiotics with good follow-up from general surgery yesterday after incision and drainage for skin boil/infection His wound is completely healed His  diarrhea has subsided He only goes to the bathroom once or twice a day, loose stool I asked the patient 5 different ways, whether he has been taking Imodium for diarrhea and ultimately, the answer was he is not taking Imodium He told me the reason he is not taking Imodium is because he felt that the diarrhea has subsided when chemotherapy was discontinued His wife who came with him does not know what pills he is taking and why he is taking some of the pills He cannot tell me which pill is for what reason I suggest that he brings all his pill bottle next week  SUMMARY OF ONCOLOGIC HISTORY: Oncology History Overview Note  Progressed on Elotuzumab, revlimid, Velcade, Daratumumab and Pomalyst   Multiple myeloma in relapse ([place])  12/12/2010 Initial Diagnosis   Multiple myeloma   02/16/2014 Imaging   Skeletal survey showed diffuse osteopenia   03/02/2014 Bone Marrow Biopsy   Accession: [id number] BM biopsy showed only 5 % plasma cells. However, the biopsy was difficult and the bone was fragmented during the procedure. Cytogenetics and FISH study is normal   03/03/2014 Procedure   he has port placement   03/10/2014 - 05/19/2014 Chemotherapy   he received elotuzumab and revlimid   05/20/2014 - 02/25/2016 Chemotherapy   Treatment is switched to maintenance Revlimid only. Treatment is stopped due to progressive disease   11/15/2014 Adverse Reaction   He has mild worsening anemia. Does of Revlimid reduced to 5 mg 21 days on, 7 days off   03/06/2016 -  Chemotherapy   He received Daratumumab and Velcade. Velcade is stopped on 07/24/16   03/06/2016 - 02/15/2019 Chemotherapy   The patient had daratumumab with Pomalyst  for chemotherapy treatment.     08/14/2016 Miscellaneous   Pomalyst is added along with Daratumumab   10/17/2016 Surgery   EGD was performed for coffee-ground emesis. Multiple linear superficial ulcers were noted in the esophagus from 25-35 cm from insertion. Severe esophagitis was noted  from 30-35 cm from insertion. Multiple biopsies were obtained. A small hiatal hernia was noted. The gastric cavity contained coffee-ground fluid, after suctioning and lavage, no obvious gastric lesions/erosion/ulceration/erythema was noted. Biopsies were taken from the antrum to rule out H. Pylori. Duodenum bulb appeared unremarkable. A small diverticulum was noted in second portion of the duodenum.     12/06/2016 Imaging   Ct scan abdomen No acute findings.  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  Stable small hiatal hernia.  Colonic diverticulosis, without radiographic evidence of diverticulitis.     REVIEW OF SYSTEMS:   Constitutional: Denies fevers, chills or abnormal weight loss Eyes: Denies blurriness of vision Ears, nose, mouth, throat, and face: Denies mucositis or sore throat Respiratory: Denies cough, dyspnea or wheezes Cardiovascular: Denies palpitation, chest discomfort or lower extremity swelling Lymphatics: Denies new lymphadenopathy or easy bruising Neurological:Denies numbness, tingling or new weaknesses Behavioral/Psych: Mood is stable, no new changes  All other systems were reviewed with the patient and are negative.  I have reviewed the past medical history, past surgical history, social history and family history with the patient and they are unchanged from previous note.  ALLERGIES:  has No Known Allergies.  MEDICATIONS:  Current Outpatient Medications  Medication Sig Dispense Refill  . acyclovir (ZOVIRAX) 400 MG tablet Take 1 tablet (400 mg total) by mouth daily. 60 tablet 11  . allopurinol (ZYLOPRIM) 100 MG tablet Take 100 mg by mouth 2 (two) times daily.     Marland Kitchen amLODipine (NORVASC) 10 MG tablet Take 10 mg by mouth daily.  1  . aspirin EC 81 MG tablet Take 81 mg by mouth daily.    Marland Kitchen atorvastatin (LIPITOR) 20 MG tablet Take 20 mg by mouth at bedtime.     . brimonidine (ALPHAGAN) 0.2 % ophthalmic solution Place 1  drop into the right eye 3 (three) times daily.     . calcium carbonate (TUMS - DOSED IN MG ELEMENTAL CALCIUM) 500 MG chewable tablet Chew 1 tablet by mouth daily as needed for indigestion or heartburn.    . cholecalciferol (VITAMIN D) 1000 UNITS tablet Take 1,000 Units by mouth daily.    Marland Kitchen dexamethasone (DECADRON) 4 MG tablet Take 5 tabs on days 1 and 3 every week, take with food, preferably in the morning 40 tablet 11  . dorzolamide-timolol (COSOPT) 22.3-6.8 MG/ML ophthalmic solution Place 1 drop into the right eye 2 (two) times daily.     . fluorometholone (FML) 0.1 % ophthalmic suspension Place 1 drop into the right eye daily.     Marland Kitchen latanoprost (XALATAN) 0.005 % ophthalmic solution Place 1 drop into the right eye at bedtime.    . lidocaine-prilocaine (EMLA) cream Apply to affected area once 30 g 3  . ondansetron (ZOFRAN) 8 MG tablet Take 1 tablet (8 mg total) by mouth every 8 (eight) hours as needed (Nausea or vomiting). 30 tablet 1  . pantoprazole (PROTONIX) 40 MG tablet TAKE 1 TABLET BY MOUTH EVERY DAY 90 tablet 5  . potassium chloride SA (KLOR-CON) 20 MEQ tablet Take 1 tablet (20 mEq total) by mouth 2 (two) times daily. 14 tablet 0  . prochlorperazine (COMPAZINE) 10 MG tablet Take 1 tablet (10 mg total)  by mouth every 6 (six) hours as needed for nausea or vomiting. 30 tablet 1  . [name], 80 MG Twice Weekly, 20 MG TBPK Take 80 mg by mouth 2 (two) times a week. Take 80 mg on days 1 and 3 every week 32 each 11   No current facility-administered medications for this visit.    PHYSICAL EXAMINATION: ECOG PERFORMANCE STATUS: 2 - Symptomatic, <50% confined to bed  Vitals:   04/06/19 0824  BP: (!) 153/71  Pulse: 99  Resp: 18  Temp: 97.8 F (36.6 C)  SpO2: 100%   Filed Weights   04/06/19 0824  Weight: 150 lb (68 kg)    GENERAL:alert, no distress and comfortable NEURO: alert & oriented x 3 with fluent speech, no focal motor/sensory deficits  LABORATORY DATA:  I have reviewed the  data as listed    Component Value Date/Time   NA 135 04/06/2019 0736   NA 140 01/09/2017 0913   K 2.8 (LL) 04/06/2019 0736   K 3.7 01/09/2017 0913   CL 107 04/06/2019 0736   CL 107 06/29/2012 1131   CO2 15 (L) 04/06/2019 0736   CO2 21 (L) 01/09/2017 0913   GLUCOSE 139 (H) 04/06/2019 0736   GLUCOSE 80 01/09/2017 0913   GLUCOSE 143 (H) 06/29/2012 1131   BUN 13 04/06/2019 0736   BUN 9.8 01/09/2017 0913   CREATININE 1.80 (H) 04/06/2019 0736   CREATININE 1.85 (H) 07/06/2018 0935   CREATININE 1.5 (H) 01/09/2017 0913   CALCIUM 8.4 (L) 04/06/2019 0736   CALCIUM 7.5 (L) 01/09/2017 0913   PROT 7.5 04/06/2019 0736   PROT 5.7 (L) 01/09/2017 0913   ALBUMIN 3.0 (L) 04/06/2019 0736   ALBUMIN 2.9 (L) 01/09/2017 0913   AST 13 (L) 04/06/2019 0736   AST 14 (L) 07/06/2018 0935   AST 12 01/09/2017 0913   ALT 14 04/06/2019 0736   ALT 11 07/06/2018 0935   ALT 10 01/09/2017 0913   ALKPHOS 70 04/06/2019 0736   ALKPHOS 55 01/09/2017 0913   BILITOT 0.8 04/06/2019 0736   BILITOT 0.5 07/06/2018 0935   BILITOT 0.44 01/09/2017 0913   GFRNONAA 36 (L) 04/06/2019 0736   GFRNONAA 35 (L) 07/06/2018 0935   GFRAA 41 (L) 04/06/2019 0736   GFRAA 40 (L) 07/06/2018 0935    No results found for: SPEP, UPEP  Lab Results  Component Value Date   WBC 13.0 (H) 04/06/2019   NEUTROABS 6.3 04/06/2019   HGB 8.5 (L) 04/06/2019   HCT 26.5 (L) 04/06/2019   MCV 94.3 04/06/2019   PLT 125 (L) 04/06/2019      Chemistry      Component Value Date/Time   NA 135 04/06/2019 0736   NA 140 01/09/2017 0913   K 2.8 (LL) 04/06/2019 0736   K 3.7 01/09/2017 0913   CL 107 04/06/2019 0736   CL 107 06/29/2012 1131   CO2 15 (L) 04/06/2019 0736   CO2 21 (L) 01/09/2017 0913   BUN 13 04/06/2019 0736   BUN 9.8 01/09/2017 0913   CREATININE 1.80 (H) 04/06/2019 0736   CREATININE 1.85 (H) 07/06/2018 0935   CREATININE 1.5 (H) 01/09/2017 0913      Component Value Date/Time   CALCIUM 8.4 (L) 04/06/2019 0736   CALCIUM 7.5 (L)  01/09/2017 0913   ALKPHOS 70 04/06/2019 0736   ALKPHOS 55 01/09/2017 0913   AST 13 (L) 04/06/2019 0736   AST 14 (L) 07/06/2018 0935   AST 12 01/09/2017 0913   ALT 14 04/06/2019 0736  ALT 11 07/06/2018 0935   ALT 10 01/09/2017 0913   BILITOT 0.8 04/06/2019 0736   BILITOT 0.5 07/06/2018 0935   BILITOT 0.44 01/09/2017 0913

## 2019-04-06 NOTE — Assessment & Plan Note (Signed)
He has multifactorial anemia His blood count is stable He does not need transfusion support

## 2019-04-06 NOTE — Assessment & Plan Note (Signed)
He has hypokalemia secondary to GI loss I recommend potassium replacement therapy

## 2019-04-06 NOTE — Assessment & Plan Note (Signed)
His diarrhea has subsided His skin infection has resolved We will resume chemotherapy today I will see him next week for further follow-up and supportive care

## 2019-04-06 NOTE — Progress Notes (Signed)
Lab called with potassium 2.8. Results given to Dr. Alvy Bimler.

## 2019-04-07 ENCOUNTER — Telehealth: Payer: Self-pay | Admitting: Hematology and Oncology

## 2019-04-07 NOTE — Telephone Encounter (Signed)
Scheduled per 3/30 sch msg. Called and spoke with pt, confirmed 4/6 appt

## 2019-04-13 ENCOUNTER — Encounter: Payer: Self-pay | Admitting: Hematology and Oncology

## 2019-04-13 ENCOUNTER — Inpatient Hospital Stay: Payer: Medicare Other

## 2019-04-13 ENCOUNTER — Ambulatory Visit: Payer: Medicare Other

## 2019-04-13 ENCOUNTER — Ambulatory Visit: Payer: Medicare Other | Admitting: Nutrition

## 2019-04-13 ENCOUNTER — Inpatient Hospital Stay: Payer: Medicare Other | Attending: Hematology and Oncology | Admitting: Hematology and Oncology

## 2019-04-13 ENCOUNTER — Other Ambulatory Visit: Payer: Self-pay

## 2019-04-13 VITALS — BP 119/71 | HR 86 | Temp 98.2°F | Resp 18 | Ht 69.0 in | Wt 144.2 lb

## 2019-04-13 VITALS — BP 122/60 | HR 72

## 2019-04-13 DIAGNOSIS — N1831 Chronic kidney disease, stage 3a: Secondary | ICD-10-CM

## 2019-04-13 DIAGNOSIS — D61818 Other pancytopenia: Secondary | ICD-10-CM | POA: Diagnosis not present

## 2019-04-13 DIAGNOSIS — E860 Dehydration: Secondary | ICD-10-CM | POA: Insufficient documentation

## 2019-04-13 DIAGNOSIS — K449 Diaphragmatic hernia without obstruction or gangrene: Secondary | ICD-10-CM | POA: Insufficient documentation

## 2019-04-13 DIAGNOSIS — C9002 Multiple myeloma in relapse: Secondary | ICD-10-CM

## 2019-04-13 DIAGNOSIS — N183 Chronic kidney disease, stage 3 unspecified: Secondary | ICD-10-CM

## 2019-04-13 DIAGNOSIS — Z9221 Personal history of antineoplastic chemotherapy: Secondary | ICD-10-CM | POA: Insufficient documentation

## 2019-04-13 DIAGNOSIS — D539 Nutritional anemia, unspecified: Secondary | ICD-10-CM | POA: Diagnosis not present

## 2019-04-13 DIAGNOSIS — Z7952 Long term (current) use of systemic steroids: Secondary | ICD-10-CM | POA: Diagnosis not present

## 2019-04-13 DIAGNOSIS — K529 Noninfective gastroenteritis and colitis, unspecified: Secondary | ICD-10-CM | POA: Insufficient documentation

## 2019-04-13 DIAGNOSIS — M8580 Other specified disorders of bone density and structure, unspecified site: Secondary | ICD-10-CM | POA: Diagnosis not present

## 2019-04-13 DIAGNOSIS — D631 Anemia in chronic kidney disease: Secondary | ICD-10-CM

## 2019-04-13 DIAGNOSIS — Z95828 Presence of other vascular implants and grafts: Secondary | ICD-10-CM

## 2019-04-13 DIAGNOSIS — Z79899 Other long term (current) drug therapy: Secondary | ICD-10-CM | POA: Diagnosis not present

## 2019-04-13 DIAGNOSIS — E875 Hyperkalemia: Secondary | ICD-10-CM | POA: Diagnosis not present

## 2019-04-13 LAB — CBC WITH DIFFERENTIAL/PLATELET
Abs Immature Granulocytes: 0.09 10*3/uL — ABNORMAL HIGH (ref 0.00–0.07)
Basophils Absolute: 0 10*3/uL (ref 0.0–0.1)
Basophils Relative: 0 %
Eosinophils Absolute: 0 10*3/uL (ref 0.0–0.5)
Eosinophils Relative: 0 %
HCT: 24 % — ABNORMAL LOW (ref 39.0–52.0)
Hemoglobin: 7.9 g/dL — ABNORMAL LOW (ref 13.0–17.0)
Immature Granulocytes: 1 %
Lymphocytes Relative: 28 %
Lymphs Abs: 2.4 10*3/uL (ref 0.7–4.0)
MCH: 30.6 pg (ref 26.0–34.0)
MCHC: 32.9 g/dL (ref 30.0–36.0)
MCV: 93 fL (ref 80.0–100.0)
Monocytes Absolute: 0.4 10*3/uL (ref 0.1–1.0)
Monocytes Relative: 5 %
Neutro Abs: 5.6 10*3/uL (ref 1.7–7.7)
Neutrophils Relative %: 66 %
Platelets: 157 10*3/uL (ref 150–400)
RBC: 2.58 MIL/uL — ABNORMAL LOW (ref 4.22–5.81)
RDW: 17.8 % — ABNORMAL HIGH (ref 11.5–15.5)
WBC: 8.6 10*3/uL (ref 4.0–10.5)
nRBC: 0.8 % — ABNORMAL HIGH (ref 0.0–0.2)

## 2019-04-13 LAB — COMPREHENSIVE METABOLIC PANEL
ALT: 11 U/L (ref 0–44)
AST: 12 U/L — ABNORMAL LOW (ref 15–41)
Albumin: 3 g/dL — ABNORMAL LOW (ref 3.5–5.0)
Alkaline Phosphatase: 64 U/L (ref 38–126)
Anion gap: 8 (ref 5–15)
BUN: 14 mg/dL (ref 8–23)
CO2: 17 mmol/L — ABNORMAL LOW (ref 22–32)
Calcium: 7.7 mg/dL — ABNORMAL LOW (ref 8.9–10.3)
Chloride: 106 mmol/L (ref 98–111)
Creatinine, Ser: 2.21 mg/dL — ABNORMAL HIGH (ref 0.61–1.24)
GFR calc Af Amer: 32 mL/min — ABNORMAL LOW (ref >60)
GFR calc non Af Amer: 28 mL/min — ABNORMAL LOW (ref >60)
Glucose, Bld: 121 mg/dL — ABNORMAL HIGH (ref 70–99)
Potassium: 5.2 mmol/L — ABNORMAL HIGH (ref 3.5–5.1)
Sodium: 131 mmol/L — ABNORMAL LOW (ref 135–145)
Total Bilirubin: 0.6 mg/dL (ref 0.3–1.2)
Total Protein: 7.1 g/dL (ref 6.5–8.1)

## 2019-04-13 LAB — SAMPLE TO BLOOD BANK

## 2019-04-13 LAB — PREPARE RBC (CROSSMATCH)

## 2019-04-13 MED ORDER — SODIUM CHLORIDE 0.9% IV SOLUTION
250.0000 mL | Freq: Once | INTRAVENOUS | Status: DC
  Filled 2019-04-13: qty 250

## 2019-04-13 MED ORDER — DIPHENHYDRAMINE HCL 25 MG PO CAPS
25.0000 mg | ORAL_CAPSULE | Freq: Once | ORAL | Status: AC
  Administered 2019-04-13: 25 mg via ORAL

## 2019-04-13 MED ORDER — SODIUM CHLORIDE 0.9 % IV SOLN
Freq: Once | INTRAVENOUS | Status: AC
  Filled 2019-04-13: qty 250

## 2019-04-13 MED ORDER — DEXAMETHASONE 4 MG PO TABS
ORAL_TABLET | ORAL | Status: AC
  Filled 2019-04-13: qty 5

## 2019-04-13 MED ORDER — DIPHENHYDRAMINE HCL 25 MG PO CAPS
ORAL_CAPSULE | ORAL | Status: AC
  Filled 2019-04-13: qty 1

## 2019-04-13 MED ORDER — DEXAMETHASONE 4 MG PO TABS
20.0000 mg | ORAL_TABLET | Freq: Once | ORAL | Status: AC
  Administered 2019-04-13: 20 mg via ORAL

## 2019-04-13 MED ORDER — ACETAMINOPHEN 325 MG PO TABS
ORAL_TABLET | ORAL | Status: AC
  Filled 2019-04-13: qty 2

## 2019-04-13 MED ORDER — ACETAMINOPHEN 325 MG PO TABS
650.0000 mg | ORAL_TABLET | Freq: Once | ORAL | Status: AC
  Administered 2019-04-13: 650 mg via ORAL

## 2019-04-13 MED ORDER — HEPARIN SOD (PORK) LOCK FLUSH 100 UNIT/ML IV SOLN
500.0000 [IU] | Freq: Every day | INTRAVENOUS | Status: AC | PRN
  Administered 2019-04-13: 500 [IU]
  Filled 2019-04-13: qty 5

## 2019-04-13 MED ORDER — SODIUM CHLORIDE 0.9% FLUSH
10.0000 mL | INTRAVENOUS | Status: AC | PRN
  Administered 2019-04-13: 10 mL
  Filled 2019-04-13: qty 10

## 2019-04-13 NOTE — Patient Instructions (Signed)
**PER DR GORSUCH, STOP TAKING POTASSIUM AT HOME**   Blood Transfusion, Adult A blood transfusion is a procedure in which you receive blood through an IV tube. You may need this procedure because of:  A bleeding disorder.  An illness.  An injury.  A surgery. The blood may come from someone else (a donor). You may also be able to donate blood for yourself. The blood given in a transfusion is made up of different types of cells. You may get:  Red blood cells. These carry oxygen to the cells in the body.  White blood cells. These help you fight infections.  Platelets. These help your blood to clot.  Plasma. This is the liquid part of your blood. It carries proteins and other substances through the body. If you have a clotting disorder, you may also get other types of blood products. Tell your doctor about:  Any blood disorders you have.  Any reactions you have had during a blood transfusion in the past.  Any allergies you have.  All medicines you are taking, including vitamins, herbs, eye drops, creams, and over-the-counter medicines.  Any surgeries you have had.  Any medical conditions you have. This includes any recent fever or cold symptoms.  Whether you are pregnant or may be pregnant. What are the risks? Generally, this is a safe procedure. However, problems may occur.  The most common problems include: ? A mild allergic reaction. This includes red, swollen areas of skin (hives) and itching. ? Fever or chills. This may be the body's response to new blood cells received. This may happen during or up to 4 hours after the transfusion.  More serious problems may include: ? Too much fluid in the lungs. This may cause breathing problems. ? A serious allergic reaction. This includes breathing trouble or swelling around the face and lips. ? Lung injury. This causes breathing trouble and low oxygen in the blood. This can happen within hours of the transfusion or days  later. ? Too much iron. This can happen after getting many blood transfusions over a period of time. ? An infection or virus passed through the blood. This is rare. Donated blood is carefully tested before it is given. ? Your body's defense system (immune system) trying to attack the new blood cells. This is rare. Symptoms may include fever, chills, nausea, low blood pressure, and low back or chest pain. ? Donated cells attacking healthy tissues. This is rare. What happens before the procedure? Medicines Ask your doctor about:  Changing or stopping your normal medicines. This is important.  Taking aspirin and ibuprofen. Do not take these medicines unless your doctor tells you to take them.  Taking over-the-counter medicines, vitamins, herbs, and supplements. General instructions  Follow instructions from your doctor about what you cannot eat or drink.  You will have a blood test to find out your blood type. The test also finds out what type of blood your body will accept and matches it to the donor type.  If you are going to have a planned surgery, you may be able to donate your own blood. This may be done in case you need a transfusion.  You will have your temperature, blood pressure, and pulse checked.  You may receive medicine to help prevent an allergic reaction. This may be done if you have had a reaction to a transfusion before. This medicine may be given to you by mouth or through an IV tube.  This procedure lasts about 1-4 hours.  Plan for the time you need. What happens during the procedure?   An IV tube will be put into one of your veins.  The bag of donated blood will be attached to your IV tube. Then, the blood will enter through your vein.  Your temperature, blood pressure, and pulse will be checked often. This is done to find early signs of a transfusion reaction.  Tell your nurse right away if you have any of these symptoms: ? Shortness of breath or trouble  breathing. ? Chest or back pain. ? Fever or chills. ? Red, swollen areas of skin or itching.  If you have any signs or symptoms of a reaction, your transfusion will be stopped. You may also be given medicine.  When the transfusion is finished, your IV tube will be taken out.  Pressure may be put on the IV site for a few minutes.  A bandage (dressing) will be put on the IV site. The procedure may vary among doctors and hospitals. What happens after the procedure?  You will be monitored until you leave the hospital or clinic. This includes checking your temperature, blood pressure, pulse, breathing rate, and blood oxygen level.  Your blood may be tested to see how you are responding to the transfusion.  You may be warmed with fluids or blankets. This is done to keep the temperature of your body normal.  If you have your procedure in an outpatient setting, you will be told whom to contact to report any reactions. Where to find more information To learn more, visit the [employer]: redcross.org Summary  A blood transfusion is a procedure in which you are given blood through an IV tube.  The blood may come from someone else (a donor). You may also be able to donate blood for yourself.  The blood you are given is made up of different blood cells. You may receive red blood cells, platelets, plasma, or white blood cells.  Your temperature, blood pressure, and pulse will be checked often.  After the procedure, your blood may be tested to see how you are responding. This information is not intended to replace advice given to you by your health care provider. Make sure you discuss any questions you have with your health care provider. Document Revised: 06/18/2018 Document Reviewed: 06/18/2018 Elsevier Patient Education  [place].

## 2019-04-13 NOTE — Progress Notes (Signed)
[facility] OFF[facility] PROGRESS NOTE  Patient Care Team: Lucianne Lei, MD as PCP - General (Family Medicine) Heath Lark, MD as Consulting Physician (Hematology and Oncology) Beverely Pace, LCSW as Social Worker (General Practice)  ASSESSMENT & PLAN:  Multiple myeloma in relapse [facility]) Patient continues to tolerate treatment poorly, partly due to poor understanding of his disease process and treatment of complications or side effects I will continue to see him on a weekly basis We will give him his oral dexamethasone this morning and he is instructed to take his pain medicine tonight His day 1 will be on Tuesdays and day 3 would be on Thursdays I plan to see him again next week for further follow-up We will continue aggressive supportive care  Anemia in chronic renal disease He has multifactorial anemia, anemia chronic kidney disease as well as his underlying bone marrow disorder and his cancer treatment  We discussed some of the risks, benefits, and alternatives of blood transfusions. The patient is symptomatic from anemia and the hemoglobin level is critically low.  Some of the side-effects to be expected including risks of transfusion reactions, chills, infection, syndrome of volume overload and risk of hospitalization from various reasons and the patient is willing to proceed and went ahead to sign consent today. He will receive a unit of blood The goal is to keep his hemoglobin greater than 8  Chronic renal insufficiency, stage III (moderate) He has acute on chronic renal failure secondary to poor oral intake and recent diarrhea I recommend IV fluid support today We will monitor his kidney functions carefully  Chronic diarrhea He felt that his diarrhea is controlled but yet he has lost a lot of weight He is instructed to take Imodium as needed if he had recurrent diarrhea with resumption of chemotherapy  Hyperkalemia His hyperkalemia is now due to dehydration and  acute renal failure as well as recent potassium replacement therapy He is instructed to stop potassium replacement   Orders Placed This Encounter  Procedures  . Informed Consent Details: Physician/Practitioner Attestation; Transcribe to consent form and obtain patient signature    Standing Status:   Future    Standing Expiration Date:   04/12/2020    Order Specific Question:   Physician/Practitioner attestation of informed consent for blood and or blood product transfusion    Answer:   I, the physician/practitioner, attest that I have discussed with the patient the benefits, risks, side effects, alternatives, likelihood of achieving goals and potential problems during recovery for the procedure that I have provided informed consent.    Order Specific Question:   Product(s)    Answer:   All Product(s)  . Care order/instruction    Transfuse Parameters    Standing Status:   Future    Standing Expiration Date:   04/12/2020  . Type and screen         Standing Status:   Future    Number of Occurrences:   1    Standing Expiration Date:   04/12/2020  . Prepare RBC (crossmatch)    Standing Status:   Standing    Number of Occurrences:   1    Order Specific Question:   # of Units    Answer:   1 unit    Order Specific Question:   Transfusion Indications    Answer:   Symptomatic Anemia    Order Specific Question:   Special Requirements    Answer:   Irradiated    Order Specific Question:  Number of Units to Keep Ahead    Answer:   NO units ahead    Order Specific Question:   Instructions:    Answer:   Transfuse    Order Specific Question:   If emergent release call blood bank    Answer:   Not emergent release    All questions were answered. The patient knows to call the clinic with any problems, questions or concerns. The total time spent in the appointment was 30 minutes encounter with patients including review of chart and various tests results, discussions about plan of care and coordination  of care plan   Heath Lark, MD 04/13/2019 4:00 PM  INTERVAL HISTORY: Please see below for problem oriented charting. He returns with his wife for further follow-up He had no further diarrhea He has lost a lot of weight due to poor appetite The patient denies any recent signs or symptoms of bleeding such as spontaneous epistaxis, hematuria or hematochezia. No further infection, fever or chills  SUMMARY OF ONCOLOGIC HISTORY: Oncology History Overview Note  Progressed on Elotuzumab, revlimid, Velcade, Daratumumab and Pomalyst   Multiple myeloma in relapse ([place])  12/12/2010 Initial Diagnosis   Multiple myeloma   02/16/2014 Imaging   Skeletal survey showed diffuse osteopenia   03/02/2014 Bone Marrow Biopsy   Accession: [id number] BM biopsy showed only 5 % plasma cells. However, the biopsy was difficult and the bone was fragmented during the procedure. Cytogenetics and FISH study is normal   03/03/2014 Procedure   he has port placement   03/10/2014 - 05/19/2014 Chemotherapy   he received elotuzumab and revlimid   05/20/2014 - 02/25/2016 Chemotherapy   Treatment is switched to maintenance Revlimid only. Treatment is stopped due to progressive disease   11/15/2014 Adverse Reaction   He has mild worsening anemia. Does of Revlimid reduced to 5 mg 21 days on, 7 days off   03/06/2016 -  Chemotherapy   He received Daratumumab and Velcade. Velcade is stopped on 07/24/16   03/06/2016 - 02/15/2019 Chemotherapy   The patient had daratumumab with Pomalyst for chemotherapy treatment.     08/14/2016 Miscellaneous   Pomalyst is added along with Daratumumab   10/17/2016 Surgery   EGD was performed for coffee-ground emesis. Multiple linear superficial ulcers were noted in the esophagus from 25-35 cm from insertion. Severe esophagitis was noted from 30-35 cm from insertion. Multiple biopsies were obtained. A small hiatal hernia was noted. The gastric cavity contained coffee-ground fluid, after suctioning  and lavage, no obvious gastric lesions/erosion/ulceration/erythema was noted. Biopsies were taken from the antrum to rule out H. Pylori. Duodenum bulb appeared unremarkable. A small diverticulum was noted in second portion of the duodenum.     12/06/2016 Imaging   Ct scan abdomen No acute findings.  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  Stable small hiatal hernia.  Colonic diverticulosis, without radiographic evidence of diverticulitis.     REVIEW OF SYSTEMS:   Constitutional: Denies fevers, chills Eyes: Denies blurriness of vision Ears, nose, mouth, throat, and face: Denies mucositis or sore throat Respiratory: Denies cough, dyspnea or wheezes Cardiovascular: Denies palpitation, chest discomfort or lower extremity swelling Gastrointestinal:  Denies nausea, heartburn or change in bowel habits Skin: Denies abnormal skin rashes Lymphatics: Denies new lymphadenopathy or easy bruising Neurological:Denies numbness, tingling or new weaknesses Behavioral/Psych: Mood is stable, no new changes  All other systems were reviewed with the patient and are negative.  I have reviewed the past medical history, past surgical history, social history  and family history with the patient and they are unchanged from previous note.  ALLERGIES:  has No Known Allergies.  MEDICATIONS:  Current Outpatient Medications  Medication Sig Dispense Refill  . acyclovir (ZOVIRAX) 400 MG tablet Take 1 tablet (400 mg total) by mouth daily. 60 tablet 11  . allopurinol (ZYLOPRIM) 100 MG tablet Take 100 mg by mouth 2 (two) times daily.     Marland Kitchen amLODipine (NORVASC) 10 MG tablet Take 10 mg by mouth daily.  1  . aspirin EC 81 MG tablet Take 81 mg by mouth daily.    Marland Kitchen atorvastatin (LIPITOR) 20 MG tablet Take 20 mg by mouth at bedtime.     . brimonidine (ALPHAGAN) 0.2 % ophthalmic solution Place 1 drop into the right eye 3 (three) times daily.     . calcium carbonate (TUMS -  DOSED IN MG ELEMENTAL CALCIUM) 500 MG chewable tablet Chew 1 tablet by mouth daily as needed for indigestion or heartburn.    . cholecalciferol (VITAMIN D) 1000 UNITS tablet Take 1,000 Units by mouth daily.    Marland Kitchen dexamethasone (DECADRON) 4 MG tablet Take 5 tabs on days 1 and 3 every week, take with food, preferably in the morning 40 tablet 11  . dorzolamide-timolol (COSOPT) 22.3-6.8 MG/ML ophthalmic solution Place 1 drop into the right eye 2 (two) times daily.     . fluorometholone (FML) 0.1 % ophthalmic suspension Place 1 drop into the right eye daily.     Marland Kitchen latanoprost (XALATAN) 0.005 % ophthalmic solution Place 1 drop into the right eye at bedtime.    . lidocaine-prilocaine (EMLA) cream Apply to affected area once 30 g 3  . ondansetron (ZOFRAN) 8 MG tablet Take 1 tablet (8 mg total) by mouth every 8 (eight) hours as needed (Nausea or vomiting). 30 tablet 1  . pantoprazole (PROTONIX) 40 MG tablet TAKE 1 TABLET BY MOUTH EVERY DAY 90 tablet 5  . prochlorperazine (COMPAZINE) 10 MG tablet Take 1 tablet (10 mg total) by mouth every 6 (six) hours as needed for nausea or vomiting. 30 tablet 1  . [name], 80 MG Twice Weekly, 20 MG TBPK Take 80 mg by mouth 2 (two) times a week. Take 80 mg on days 1 and 3 every week 32 each 11   No current facility-administered medications for this visit.    PHYSICAL EXAMINATION: ECOG PERFORMANCE STATUS: 2 - Symptomatic, <50% confined to bed  Vitals:   04/13/19 1045  BP: 119/71  Pulse: 86  Resp: 18  Temp: 98.2 F (36.8 C)  SpO2: 100%   Filed Weights   04/13/19 1045  Weight: 144 lb 3.2 oz (65.4 kg)    GENERAL:alert, no distress and comfortable.  The patient look cachectic SKIN: skin color, texture, turgor are normal, no rashes or significant lesions EYES: normal, Conjunctiva are pink and non-injected, sclera clear OROPHARYNX:no exudate, no erythema and lips, buccal mucosa, and tongue normal  NECK: supple, thyroid normal size, non-tender, without  nodularity LYMPH:  no palpable lymphadenopathy in the cervical, axillary or inguinal LUNGS: clear to auscultation and percussion with normal breathing effort HEART: regular rate & rhythm and no murmurs and no lower extremity edema ABDOMEN:abdomen soft, non-tender and normal bowel sounds Musculoskeletal:no cyanosis of digits and no clubbing  NEURO: alert & oriented x 3 with fluent speech, no focal motor/sensory deficits  LABORATORY DATA:  I have reviewed the data as listed    Component Value Date/Time   NA 131 (L) 04/13/2019 1002   NA 140 01/09/2017 0913  K 5.2 (H) 04/13/2019 1002   K 3.7 01/09/2017 0913   CL 106 04/13/2019 1002   CL 107 06/29/2012 1131   CO2 17 (L) 04/13/2019 1002   CO2 21 (L) 01/09/2017 0913   GLUCOSE 121 (H) 04/13/2019 1002   GLUCOSE 80 01/09/2017 0913   GLUCOSE 143 (H) 06/29/2012 1131   BUN 14 04/13/2019 1002   BUN 9.8 01/09/2017 0913   CREATININE 2.21 (H) 04/13/2019 1002   CREATININE 1.85 (H) 07/06/2018 0935   CREATININE 1.5 (H) 01/09/2017 0913   CALCIUM 7.7 (L) 04/13/2019 1002   CALCIUM 7.5 (L) 01/09/2017 0913   PROT 7.1 04/13/2019 1002   PROT 5.7 (L) 01/09/2017 0913   ALBUMIN 3.0 (L) 04/13/2019 1002   ALBUMIN 2.9 (L) 01/09/2017 0913   AST 12 (L) 04/13/2019 1002   AST 14 (L) 07/06/2018 0935   AST 12 01/09/2017 0913   ALT 11 04/13/2019 1002   ALT 11 07/06/2018 0935   ALT 10 01/09/2017 0913   ALKPHOS 64 04/13/2019 1002   ALKPHOS 55 01/09/2017 0913   BILITOT 0.6 04/13/2019 1002   BILITOT 0.5 07/06/2018 0935   BILITOT 0.44 01/09/2017 0913   GFRNONAA 28 (L) 04/13/2019 1002   GFRNONAA 35 (L) 07/06/2018 0935   GFRAA 32 (L) 04/13/2019 1002   GFRAA 40 (L) 07/06/2018 0935    No results found for: SPEP, UPEP  Lab Results  Component Value Date   WBC 8.6 04/13/2019   NEUTROABS 5.6 04/13/2019   HGB 7.9 (L) 04/13/2019   HCT 24.0 (L) 04/13/2019   MCV 93.0 04/13/2019   PLT 157 04/13/2019      Chemistry      Component Value Date/Time   NA 131 (L)  04/13/2019 1002   NA 140 01/09/2017 0913   K 5.2 (H) 04/13/2019 1002   K 3.7 01/09/2017 0913   CL 106 04/13/2019 1002   CL 107 06/29/2012 1131   CO2 17 (L) 04/13/2019 1002   CO2 21 (L) 01/09/2017 0913   BUN 14 04/13/2019 1002   BUN 9.8 01/09/2017 0913   CREATININE 2.21 (H) 04/13/2019 1002   CREATININE 1.85 (H) 07/06/2018 0935   CREATININE 1.5 (H) 01/09/2017 0913      Component Value Date/Time   CALCIUM 7.7 (L) 04/13/2019 1002   CALCIUM 7.5 (L) 01/09/2017 0913   ALKPHOS 64 04/13/2019 1002   ALKPHOS 55 01/09/2017 0913   AST 12 (L) 04/13/2019 1002   AST 14 (L) 07/06/2018 0935   AST 12 01/09/2017 0913   ALT 11 04/13/2019 1002   ALT 11 07/06/2018 0935   ALT 10 01/09/2017 0913   BILITOT 0.6 04/13/2019 1002   BILITOT 0.5 07/06/2018 0935   BILITOT 0.44 01/09/2017 0913

## 2019-04-13 NOTE — Assessment & Plan Note (Signed)
Patient continues to tolerate treatment poorly, partly due to poor understanding of his disease process and treatment of complications or side effects I will continue to see him on a weekly basis We will give him his oral dexamethasone this morning and he is instructed to take his pain medicine tonight His day 1 will be on Tuesdays and day 3 would be on Thursdays I plan to see him again next week for further follow-up We will continue aggressive supportive care

## 2019-04-13 NOTE — Assessment & Plan Note (Signed)
He felt that his diarrhea is controlled but yet he has lost a lot of weight He is instructed to take Imodium as needed if he had recurrent diarrhea with resumption of chemotherapy

## 2019-04-13 NOTE — Progress Notes (Signed)
MD requested nutrition consult. Patient is a 74 year old male diagnosed with multiple myeloma who is receiving IV fluids today.  He is a patient of Dr. Alvy Bimler.  Past medical history includes hypertension, hyperlipidemia, chronic kidney disease stage III.  Medications include Lipitor,Tums, vitamin D, Decadron, Zofran, Protonix, Compazine.  Labs noted: sodium 131, potassium 5.2, glucose 121, BUN 14, creatinine 2.21.  Height: 5 feet 9 inches. Weight: 144.2 pounds on April 6. Usual body weight: 175.6 pounds on January 11. BMI: 21.29.  Patient denies nausea, vomiting, diarrhea, and constipation.  He reports he used to have diarrhea but it is resolved. He complains of taste alterations and states that is the reason he does not eat well. He states no one prepares food for him. Patient agreeable to trying Ensure Enlive today.  Nutrition diagnosis:  Unintended weight loss related to multiple myeloma and associated treatments as evidenced by 18% weight loss over 3 months.  This is significant.  Intervention: Educated patient to try to increase calories and protein in small frequent meals and snacks. Brief education provided on improving taste alterations. Recommended patient increase water. Encourage patient to consume oral nutrition supplements twice a day and provided samples.  Monitoring, evaluation, goals: Patient will tolerate increased calories and protein to minimize weight loss.  Next visit: To be scheduled as needed.  **Disclaimer: This note was dictated with voice recognition software. Similar sounding words can inadvertently be transcribed and this note may contain transcription errors which may not have been corrected upon publication of note.**

## 2019-04-13 NOTE — Assessment & Plan Note (Signed)
His hyperkalemia is now due to dehydration and acute renal failure as well as recent potassium replacement therapy He is instructed to stop potassium replacement

## 2019-04-13 NOTE — Assessment & Plan Note (Signed)
He has acute on chronic renal failure secondary to poor oral intake and recent diarrhea I recommend IV fluid support today We will monitor his kidney functions carefully

## 2019-04-13 NOTE — Assessment & Plan Note (Signed)
He has multifactorial anemia, anemia chronic kidney disease as well as his underlying bone marrow disorder and his cancer treatment  We discussed some of the risks, benefits, and alternatives of blood transfusions. The patient is symptomatic from anemia and the hemoglobin level is critically low.  Some of the side-effects to be expected including risks of transfusion reactions, chills, infection, syndrome of volume overload and risk of hospitalization from various reasons and the patient is willing to proceed and went ahead to sign consent today. He will receive a unit of blood The goal is to keep his hemoglobin greater than 8

## 2019-04-14 ENCOUNTER — Telehealth: Payer: Self-pay

## 2019-04-14 ENCOUNTER — Inpatient Hospital Stay: Payer: Medicare Other

## 2019-04-14 ENCOUNTER — Other Ambulatory Visit: Payer: Self-pay

## 2019-04-14 DIAGNOSIS — E860 Dehydration: Secondary | ICD-10-CM | POA: Diagnosis not present

## 2019-04-14 DIAGNOSIS — M8580 Other specified disorders of bone density and structure, unspecified site: Secondary | ICD-10-CM | POA: Diagnosis not present

## 2019-04-14 DIAGNOSIS — C9002 Multiple myeloma in relapse: Secondary | ICD-10-CM

## 2019-04-14 DIAGNOSIS — D61818 Other pancytopenia: Secondary | ICD-10-CM | POA: Diagnosis not present

## 2019-04-14 DIAGNOSIS — N183 Chronic kidney disease, stage 3 unspecified: Secondary | ICD-10-CM | POA: Diagnosis not present

## 2019-04-14 DIAGNOSIS — Z9221 Personal history of antineoplastic chemotherapy: Secondary | ICD-10-CM | POA: Diagnosis not present

## 2019-04-14 DIAGNOSIS — Z95828 Presence of other vascular implants and grafts: Secondary | ICD-10-CM

## 2019-04-14 DIAGNOSIS — K529 Noninfective gastroenteritis and colitis, unspecified: Secondary | ICD-10-CM | POA: Diagnosis not present

## 2019-04-14 DIAGNOSIS — D539 Nutritional anemia, unspecified: Secondary | ICD-10-CM | POA: Diagnosis not present

## 2019-04-14 DIAGNOSIS — K449 Diaphragmatic hernia without obstruction or gangrene: Secondary | ICD-10-CM | POA: Diagnosis not present

## 2019-04-14 DIAGNOSIS — E875 Hyperkalemia: Secondary | ICD-10-CM | POA: Diagnosis not present

## 2019-04-14 DIAGNOSIS — Z79899 Other long term (current) drug therapy: Secondary | ICD-10-CM | POA: Diagnosis not present

## 2019-04-14 DIAGNOSIS — Z7952 Long term (current) use of systemic steroids: Secondary | ICD-10-CM | POA: Diagnosis not present

## 2019-04-14 LAB — KAPPA/LAMBDA LIGHT CHAINS
Kappa free light chain: 349.9 mg/L — ABNORMAL HIGH (ref 3.3–19.4)
Kappa, lambda light chain ratio: 102.91 — ABNORMAL HIGH (ref 0.26–1.65)
Lambda free light chains: 3.4 mg/L — ABNORMAL LOW (ref 5.7–26.3)

## 2019-04-14 MED ORDER — SODIUM CHLORIDE 0.9% FLUSH
10.0000 mL | INTRAVENOUS | Status: AC | PRN
  Administered 2019-04-14: 10 mL
  Filled 2019-04-14: qty 10

## 2019-04-14 MED ORDER — ACETAMINOPHEN 325 MG PO TABS
ORAL_TABLET | ORAL | Status: AC
  Filled 2019-04-14: qty 2

## 2019-04-14 MED ORDER — DIPHENHYDRAMINE HCL 25 MG PO CAPS
25.0000 mg | ORAL_CAPSULE | Freq: Once | ORAL | Status: AC
  Administered 2019-04-14: 25 mg via ORAL

## 2019-04-14 MED ORDER — ACETAMINOPHEN 325 MG PO TABS
650.0000 mg | ORAL_TABLET | Freq: Once | ORAL | Status: AC
  Administered 2019-04-14: 650 mg via ORAL

## 2019-04-14 MED ORDER — SODIUM CHLORIDE 0.9% IV SOLUTION
250.0000 mL | Freq: Once | INTRAVENOUS | Status: DC
  Filled 2019-04-14: qty 250

## 2019-04-14 MED ORDER — DIPHENHYDRAMINE HCL 25 MG PO CAPS
ORAL_CAPSULE | ORAL | Status: AC
  Filled 2019-04-14: qty 1

## 2019-04-14 MED ORDER — HEPARIN SOD (PORK) LOCK FLUSH 100 UNIT/ML IV SOLN
500.0000 [IU] | Freq: Every day | INTRAVENOUS | Status: AC | PRN
  Administered 2019-04-14: 500 [IU]
  Filled 2019-04-14: qty 5

## 2019-04-14 NOTE — Patient Instructions (Signed)

## 2019-04-14 NOTE — Telephone Encounter (Signed)
Blood bank called about the unit of blood for today. The blood will have a tag that needs to be signed by Dr. Alvy Bimler, the tag will say that the blood is least compatible due to the Daratumumab. Per Dr. Alvy Bimler, she will sign the tag tomorrow and Mr. Millstein should get the unit of blood today. Blood bank aware that tag will be signed tomorrow.

## 2019-04-15 ENCOUNTER — Telehealth: Payer: Self-pay | Admitting: Hematology and Oncology

## 2019-04-15 LAB — TYPE AND SCREEN
ABO/RH(D): O POS
Antibody Screen: POSITIVE
DAT, IgG: POSITIVE
Unit division: 0

## 2019-04-15 LAB — BPAM RBC
Blood Product Expiration Date: 202104252359
ISSUE DATE / TIME: 202104071222
Unit Type and Rh: 5100

## 2019-04-15 NOTE — Telephone Encounter (Signed)
Scheduled per 4/6 appt called and spoke with pt, confirmed 4/13 appt

## 2019-04-16 DIAGNOSIS — H4051X2 Glaucoma secondary to other eye disorders, right eye, moderate stage: Secondary | ICD-10-CM | POA: Diagnosis not present

## 2019-04-16 LAB — MULTIPLE MYELOMA PANEL, SERUM
Albumin SerPl Elph-Mcnc: 2.9 g/dL (ref 2.9–4.4)
Albumin/Glob SerPl: 0.9 (ref 0.7–1.7)
Alpha 1: 0.3 g/dL (ref 0.0–0.4)
Alpha2 Glob SerPl Elph-Mcnc: 0.8 g/dL (ref 0.4–1.0)
B-Globulin SerPl Elph-Mcnc: 0.8 g/dL (ref 0.7–1.3)
Gamma Glob SerPl Elph-Mcnc: 1.5 g/dL (ref 0.4–1.8)
Globulin, Total: 3.4 g/dL (ref 2.2–3.9)
IgA: 12 mg/dL — ABNORMAL LOW (ref 61–437)
IgG (Immunoglobin G), Serum: 1715 mg/dL — ABNORMAL HIGH (ref 603–1613)
IgM (Immunoglobulin M), Srm: 7 mg/dL — ABNORMAL LOW (ref 15–143)
M Protein SerPl Elph-Mcnc: 1.3 g/dL — ABNORMAL HIGH
Total Protein ELP: 6.3 g/dL (ref 6.0–8.5)

## 2019-04-18 ENCOUNTER — Other Ambulatory Visit: Payer: Self-pay

## 2019-04-18 ENCOUNTER — Inpatient Hospital Stay (HOSPITAL_COMMUNITY)
Admission: EM | Admit: 2019-04-18 | Discharge: 2019-04-22 | DRG: 871 | Disposition: A | Discharge status: Home Health | Payer: Medicare Other | Attending: Internal Medicine | Admitting: Internal Medicine

## 2019-04-18 ENCOUNTER — Encounter (HOSPITAL_COMMUNITY): Payer: Self-pay

## 2019-04-18 ENCOUNTER — Emergency Department (HOSPITAL_COMMUNITY): Payer: Medicare Other

## 2019-04-18 DIAGNOSIS — E876 Hypokalemia: Secondary | ICD-10-CM | POA: Diagnosis not present

## 2019-04-18 DIAGNOSIS — E440 Moderate protein-calorie malnutrition: Secondary | ICD-10-CM | POA: Diagnosis not present

## 2019-04-18 DIAGNOSIS — R9431 Abnormal electrocardiogram [ECG] [EKG]: Secondary | ICD-10-CM | POA: Diagnosis not present

## 2019-04-18 DIAGNOSIS — I10 Essential (primary) hypertension: Secondary | ICD-10-CM | POA: Diagnosis not present

## 2019-04-18 DIAGNOSIS — R918 Other nonspecific abnormal finding of lung field: Secondary | ICD-10-CM | POA: Diagnosis not present

## 2019-04-18 DIAGNOSIS — Z7952 Long term (current) use of systemic steroids: Secondary | ICD-10-CM | POA: Diagnosis not present

## 2019-04-18 DIAGNOSIS — Z20822 Contact with and (suspected) exposure to covid-19: Secondary | ICD-10-CM | POA: Diagnosis present

## 2019-04-18 DIAGNOSIS — E785 Hyperlipidemia, unspecified: Secondary | ICD-10-CM | POA: Diagnosis not present

## 2019-04-18 DIAGNOSIS — Z743 Need for continuous supervision: Secondary | ICD-10-CM | POA: Diagnosis not present

## 2019-04-18 DIAGNOSIS — R531 Weakness: Secondary | ICD-10-CM | POA: Diagnosis not present

## 2019-04-18 DIAGNOSIS — Z9221 Personal history of antineoplastic chemotherapy: Secondary | ICD-10-CM

## 2019-04-18 DIAGNOSIS — I129 Hypertensive chronic kidney disease with stage 1 through stage 4 chronic kidney disease, or unspecified chronic kidney disease: Secondary | ICD-10-CM | POA: Diagnosis not present

## 2019-04-18 DIAGNOSIS — D61818 Other pancytopenia: Secondary | ICD-10-CM | POA: Diagnosis not present

## 2019-04-18 DIAGNOSIS — C9002 Multiple myeloma in relapse: Secondary | ICD-10-CM | POA: Diagnosis present

## 2019-04-18 DIAGNOSIS — E878 Other disorders of electrolyte and fluid balance, not elsewhere classified: Secondary | ICD-10-CM | POA: Diagnosis not present

## 2019-04-18 DIAGNOSIS — R112 Nausea with vomiting, unspecified: Secondary | ICD-10-CM | POA: Diagnosis not present

## 2019-04-18 DIAGNOSIS — L8990 Pressure ulcer of unspecified site, unspecified stage: Secondary | ICD-10-CM | POA: Insufficient documentation

## 2019-04-18 DIAGNOSIS — J189 Pneumonia, unspecified organism: Secondary | ICD-10-CM

## 2019-04-18 DIAGNOSIS — N1831 Chronic kidney disease, stage 3a: Secondary | ICD-10-CM | POA: Diagnosis present

## 2019-04-18 DIAGNOSIS — N189 Chronic kidney disease, unspecified: Secondary | ICD-10-CM | POA: Diagnosis present

## 2019-04-18 DIAGNOSIS — J154 Pneumonia due to other streptococci: Secondary | ICD-10-CM | POA: Diagnosis not present

## 2019-04-18 DIAGNOSIS — E039 Hypothyroidism, unspecified: Secondary | ICD-10-CM | POA: Diagnosis present

## 2019-04-18 DIAGNOSIS — E43 Unspecified severe protein-calorie malnutrition: Secondary | ICD-10-CM | POA: Diagnosis not present

## 2019-04-18 DIAGNOSIS — Z681 Body mass index (BMI) 19 or less, adult: Secondary | ICD-10-CM

## 2019-04-18 DIAGNOSIS — L89150 Pressure ulcer of sacral region, unstageable: Secondary | ICD-10-CM | POA: Diagnosis not present

## 2019-04-18 DIAGNOSIS — A419 Sepsis, unspecified organism: Secondary | ICD-10-CM | POA: Diagnosis not present

## 2019-04-18 DIAGNOSIS — R6520 Severe sepsis without septic shock: Secondary | ICD-10-CM | POA: Diagnosis not present

## 2019-04-18 DIAGNOSIS — Z7982 Long term (current) use of aspirin: Secondary | ICD-10-CM | POA: Diagnosis not present

## 2019-04-18 DIAGNOSIS — D631 Anemia in chronic kidney disease: Secondary | ICD-10-CM | POA: Diagnosis present

## 2019-04-18 DIAGNOSIS — L89152 Pressure ulcer of sacral region, stage 2: Secondary | ICD-10-CM | POA: Diagnosis not present

## 2019-04-18 DIAGNOSIS — Z79899 Other long term (current) drug therapy: Secondary | ICD-10-CM

## 2019-04-18 DIAGNOSIS — J13 Pneumonia due to Streptococcus pneumoniae: Secondary | ICD-10-CM | POA: Diagnosis not present

## 2019-04-18 DIAGNOSIS — A409 Streptococcal sepsis, unspecified: Secondary | ICD-10-CM | POA: Diagnosis not present

## 2019-04-18 DIAGNOSIS — N183 Chronic kidney disease, stage 3 unspecified: Secondary | ICD-10-CM | POA: Diagnosis not present

## 2019-04-18 LAB — LACTIC ACID, PLASMA: Lactic Acid, Venous: 2.3 mmol/L (ref 0.5–1.9)

## 2019-04-18 LAB — COMPREHENSIVE METABOLIC PANEL
ALT: 40 U/L (ref 0–44)
AST: 23 U/L (ref 15–41)
Albumin: 3.3 g/dL — ABNORMAL LOW (ref 3.5–5.0)
Alkaline Phosphatase: 50 U/L (ref 38–126)
Anion gap: 11 (ref 5–15)
BUN: 20 mg/dL (ref 8–23)
CO2: 19 mmol/L — ABNORMAL LOW (ref 22–32)
Calcium: 7.4 mg/dL — ABNORMAL LOW (ref 8.9–10.3)
Chloride: 103 mmol/L (ref 98–111)
Creatinine, Ser: 1.56 mg/dL — ABNORMAL HIGH (ref 0.61–1.24)
GFR calc Af Amer: 49 mL/min — ABNORMAL LOW (ref >60)
GFR calc non Af Amer: 43 mL/min — ABNORMAL LOW (ref >60)
Glucose, Bld: 96 mg/dL (ref 70–99)
Potassium: 4.2 mmol/L (ref 3.5–5.1)
Sodium: 133 mmol/L — ABNORMAL LOW (ref 135–145)
Total Bilirubin: 0.8 mg/dL (ref 0.3–1.2)
Total Protein: 6.7 g/dL (ref 6.5–8.1)

## 2019-04-18 LAB — URINALYSIS, ROUTINE W REFLEX MICROSCOPIC
Bilirubin Urine: NEGATIVE
Glucose, UA: NEGATIVE mg/dL
Hgb urine dipstick: NEGATIVE
Ketones, ur: NEGATIVE mg/dL
Leukocytes,Ua: NEGATIVE
Nitrite: NEGATIVE
Protein, ur: NEGATIVE mg/dL
Specific Gravity, Urine: 1.006 (ref 1.005–1.030)
pH: 6 (ref 5.0–8.0)

## 2019-04-18 LAB — RAPID URINE DRUG SCREEN, HOSP PERFORMED
Amphetamines: NOT DETECTED
Barbiturates: NOT DETECTED
Benzodiazepines: NOT DETECTED
Cocaine: NOT DETECTED
Opiates: NOT DETECTED
Tetrahydrocannabinol: NOT DETECTED

## 2019-04-18 LAB — CBC WITH DIFFERENTIAL/PLATELET
Abs Immature Granulocytes: 0.08 10*3/uL — ABNORMAL HIGH (ref 0.00–0.07)
Basophils Absolute: 0 10*3/uL (ref 0.0–0.1)
Basophils Relative: 0 %
Eosinophils Absolute: 0 10*3/uL (ref 0.0–0.5)
Eosinophils Relative: 0 %
HCT: 29.3 % — ABNORMAL LOW (ref 39.0–52.0)
Hemoglobin: 9.6 g/dL — ABNORMAL LOW (ref 13.0–17.0)
Immature Granulocytes: 1 %
Lymphocytes Relative: 11 %
Lymphs Abs: 1.6 10*3/uL (ref 0.7–4.0)
MCH: 28.3 pg (ref 26.0–34.0)
MCHC: 32.8 g/dL (ref 30.0–36.0)
MCV: 86.4 fL (ref 80.0–100.0)
Monocytes Absolute: 0.3 10*3/uL (ref 0.1–1.0)
Monocytes Relative: 2 %
Neutro Abs: 12.9 10*3/uL — ABNORMAL HIGH (ref 1.7–7.7)
Neutrophils Relative %: 86 %
Platelets: 167 10*3/uL (ref 150–400)
RBC: 3.39 MIL/uL — ABNORMAL LOW (ref 4.22–5.81)
RDW: 25.1 % — ABNORMAL HIGH (ref 11.5–15.5)
WBC: 15 10*3/uL — ABNORMAL HIGH (ref 4.0–10.5)
nRBC: 2.5 % — ABNORMAL HIGH (ref 0.0–0.2)

## 2019-04-18 LAB — ETHANOL: Alcohol, Ethyl (B): <10 mg/dL (ref <10)

## 2019-04-18 LAB — LIPASE, BLOOD: Lipase: 46 U/L (ref 11–51)

## 2019-04-18 LAB — POC SARS CORONAVIRUS 2 AG -  ED: SARS Coronavirus 2 Ag: NEGATIVE

## 2019-04-18 MED ORDER — SODIUM CHLORIDE 0.9 % IV BOLUS
1000.0000 mL | Freq: Once | INTRAVENOUS | Status: AC
  Administered 2019-04-18: 1000 mL via INTRAVENOUS

## 2019-04-18 MED ORDER — SODIUM CHLORIDE 0.9 % IV SOLN: INTRAVENOUS | Status: DC

## 2019-04-18 MED ORDER — ACETAMINOPHEN 500 MG PO TABS: 1000.0000 mg | ORAL_TABLET | Freq: Once | ORAL | Status: DC

## 2019-04-18 MED ORDER — VANCOMYCIN HCL IN DEXTROSE 1-5 GM/200ML-% IV SOLN: 1000.0000 mg | Freq: Once | INTRAVENOUS | Status: DC

## 2019-04-18 MED ORDER — SODIUM CHLORIDE 0.9 % IV SOLN
2.0000 g | Freq: Once | INTRAVENOUS | Status: AC
  Administered 2019-04-18: 2 g via INTRAVENOUS
  Filled 2019-04-18: qty 2

## 2019-04-18 MED ORDER — VANCOMYCIN HCL 1250 MG/250ML IV SOLN
1250.0000 mg | Freq: Once | INTRAVENOUS | Status: AC
  Administered 2019-04-18: 1250 mg via INTRAVENOUS
  Filled 2019-04-18: qty 250

## 2019-04-18 NOTE — Progress Notes (Signed)
A consult was received from an ED physician for Vancomycin, cefepime  per pharmacy dosing.  The patient's profile has been reviewed for ht/wt/allergies/indication/available labs.   A one time order has been placed for Vancomycin 1.25gm iv x1, and Cefepime 2gm iv x1.  Further antibiotics/pharmacy consults should be ordered by admitting physician if indicated.                       Thank you, Nani Skillern Crowford 04/18/2019  9:40 PM

## 2019-04-18 NOTE — ED Notes (Signed)
Date and time results received: 04/18/19  9:47 PM  (use smartphrase ".now" to insert current time)  Test: Lactic Acid Critical Value: 2.3  Name of Provider Notified: Dr.Lockwood  Orders Received? Or Actions Taken?

## 2019-04-18 NOTE — H&P (Addendum)
Calvin Mccormick CHE:[id number] DOB: Sep 19, 1945 DOA: 04/18/2019     PCP: Lucianne Lei, MD   Outpatient Specialists:      Oncology  Dr. Alvy Bimler    Patient arrived to ER on 04/18/19 at 1943  Patient coming from: home Lives alone   Chief Complaint:  Chief Complaint  Patient presents with  . Emesis  . Weakness    HPI: Calvin Mccormick is a 74 y.o. male with medical history significant of relapsing multiple myeloma, anemia of chronic renal disease, CKD, chronic diarrhea, hypokalemia    Presented with confusion and vomiting possibly febrile.  EMS in route received Tylenol Zofran and IV fluids patient himself unable to provide detailed history. Patient reports feeling very weak and has had a fall.  Otherwise denies any abdominal pain   Infectious risk factors:  Reports fever  N/V/Diarrhea/abdominal pain,      Has  NOt been vaccinated against COVID   In  ER RAPID COVID TEST  NEGATIVE   in house  PCR testing  Pending  No results found for: SARSCOV2NAA   Regarding pertinent Chronic problems:   Multiple myeloma currently treated po chemotherapy [name] And recurrent blood transfusions   Hyperlipidemia - on statins Lipitor   HTN on Norvasc   CKD stage III - baseline Cr 2.0 Lab Results  Component Value Date   CREATININE 1.56 (H) 04/18/2019   CREATININE 2.21 (H) 04/13/2019   CREATININE 1.80 (H) 04/06/2019     While in ER: Noted to have leukocytosis hemoglobin 9.6 Febrile up to 102 Chest x-ray showing multifocal patchy opacities suspicious for pneumonia    Hospitalist was called for admission for sepsis and multifocal pNA  The following Work up has been ordered so far:  Orders Placed This Encounter  Procedures  . Blood Cultures (routine x 2)  . Urine culture  . DG Chest Port 1 View  . Comprehensive metabolic panel  . Ethanol  . Lipase, blood  . CBC with Differential  . Urinalysis, Routine w reflex microscopic  . Lactic acid, plasma  . Urine rapid drug screen  (hosp performed)  . Lactic acid, plasma  . Diet NPO time specified  . Cardiac monitoring  . Initiate Carrier Fluid Protocol  . Refer to Sidebar Report: Sepsis Sidebar ED/IP  . Document vital signs within 1-hour of fluid bolus completion and notify provider of bolus completion  . Document height and weight  . Initiate Code Sepsis ([facility] [id number])  When activated, this will prioritize pharmacy, lab, and radiology services for this patient for STAT collections and interventions.  . Consult to hospitalist  ALL PATIENTS BEING ADMITTED/HAVING PROCEDURES NEED COVID-19 SCREENING  . Airborne and Contact precautions  . Pulse oximetry, continuous  . POC SARS Coronavirus 2 Ag-ED - Nasal Swab (BD Veritor Kit)  . ED EKG  . Insert peripheral IV    Following Medications were ordered in ER: Medications  sodium chloride 0.9 % bolus 1,000 mL (0 mLs Intravenous Stopped 04/18/19 2208)    And  0.9 %  sodium chloride infusion (has no administration in time range)  ceFEPIme (MAXIPIME) 2 g in sodium chloride 0.9 % 100 mL IVPB (has no administration in time range)  vancomycin (VANCOREADY) IVPB 1250 mg/250 mL (has no administration in time range)  sodium chloride 0.9 % bolus 1,000 mL (0 mLs Intravenous Stopped 04/18/19 2208)        Consult Orders  (From admission, onward)         Start  Ordered   04/18/19 2139  Consult to hospitalist  ALL PATIENTS BEING ADMITTED/HAVING PROCEDURES NEED COVID-19 SCREENING  Once    Comments: ALL PATIENTS BEING ADMITTED/HAVING PROCEDURES NEED COVID-19 SCREENING  Provider:  (Not yet assigned)  Question Answer Comment  Place call to: [facility]   Reason for Consult Admit      04/18/19 2138           Significant initial  Findings: Abnormal Labs Reviewed  COMPREHENSIVE METABOLIC PANEL - Abnormal; Notable for the following components:      Result Value   Sodium 133 (*)    CO2 19 (*)    Creatinine, Ser 1.56 (*)    Calcium 7.4 (*)    Albumin 3.3 (*)      GFR calc non Af Amer 43 (*)    GFR calc Af Amer 49 (*)    All other components within normal limits  CBC WITH DIFFERENTIAL/PLATELET - Abnormal; Notable for the following components:   WBC 15.0 (*)    RBC 3.39 (*)    Hemoglobin 9.6 (*)    HCT 29.3 (*)    RDW 25.1 (*)    nRBC 2.5 (*)    Neutro Abs 12.9 (*)    Abs Immature Granulocytes 0.08 (*)    All other components within normal limits  URINALYSIS, ROUTINE W REFLEX MICROSCOPIC - Abnormal; Notable for the following components:   Color, Urine STRAW (*)    All other components within normal limits  LACTIC ACID, PLASMA - Abnormal; Notable for the following components:   Lactic Acid, Venous 2.3 (*)    All other components within normal limits     Otherwise labs showing:    Recent Labs  Lab 04/13/19 1002 04/18/19 2004  NA 131* 133*  K 5.2* 4.2  CO2 17* 19*  GLUCOSE 121* 96  BUN 14 20  CREATININE 2.21* 1.56*  CALCIUM 7.7* 7.4*    Cr down  from baseline see below Lab Results  Component Value Date   CREATININE 1.56 (H) 04/18/2019   CREATININE 2.21 (H) 04/13/2019   CREATININE 1.80 (H) 04/06/2019    Recent Labs  Lab 04/13/19 1002 04/18/19 2004  AST 12* 23  ALT 11 40  ALKPHOS 64 50  BILITOT 0.6 0.8  PROT 7.1 6.7  ALBUMIN 3.0* 3.3*   Lab Results  Component Value Date   CALCIUM 7.4 (L) 04/18/2019   PHOS 5.4 (H) 10/17/2016      WBC      Component Value Date/Time   WBC 15.0 (H) 04/18/2019 2004   ANC    Component Value Date/Time   NEUTROABS 12.9 (H) 04/18/2019 2004   NEUTROABS 1.3 (L) 01/09/2017 0913   ALC 1.6  Plt: Lab Results  Component Value Date   PLT 167 04/18/2019    Lactic Acid, Venous    Component Value Date/Time   LATICACIDVEN 2.3 (HH) 04/18/2019 2028    Procalcitonin   Ordered   COVID-19 Labs  No results for input(s): DDIMER, FERRITIN, LDH, CRP in the last 72 hours.  No results found for: SARSCOV2NAA   HG/HCT  stable,       Component Value Date/Time   HGB 9.6 (L)  04/18/2019 2004   HGB 8.4 (L) 01/09/2017 0913   HCT 29.3 (L) 04/18/2019 2004   HCT 25.0 (L) 01/09/2017 0913    Recent Labs  Lab 04/18/19 2004  LIPASE 46   No results for input(s): AMMONIA in the last 168 hours.  No components found for: LABALBU  ECG: Ordered Personally reviewed by me showing: HR : 94 Rhythm:  NSR   no evidence of ischemic changes QTC 433    UA   no evidence of UTI     Urine analysis:    Component Value Date/Time   COLORURINE STRAW (A) 04/18/2019 2004   APPEARANCEUR CLEAR 04/18/2019 2004   LABSPEC 1.006 04/18/2019 2004   PHURINE 6.0 04/18/2019 2004   GLUCOSEU NEGATIVE 04/18/2019 2004   HGBUR NEGATIVE 04/18/2019 2004   BILIRUBINUR NEGATIVE 04/18/2019 2004   KETONESUR NEGATIVE 04/18/2019 2004   PROTEINUR NEGATIVE 04/18/2019 2004   UROBILINOGEN 0.2 07/24/2011 1501   NITRITE NEGATIVE 04/18/2019 2004   LEUKOCYTESUR NEGATIVE 04/18/2019 2004       Ordered   CXR - multifocal    ED Triage Vitals  Enc Vitals Group     BP 04/18/19 2006 (!) 146/75     Pulse Rate 04/18/19 2007 90     Resp 04/18/19 2017 20     Temp 04/18/19 2017 (!) 102.4 F (39.1 C)     Temp Source 04/18/19 2017 Rectal     SpO2 04/18/19 1955 98 %     Weight 04/18/19 2016 143 lb 4.8 oz (65 kg)     Height 04/18/19 2016 '5\' 11"'  (1.803 m)     Head Circumference --      Peak Flow --      Pain Score --      Pain Loc --      Pain Edu? --      Excl. in [place]? --   TMAX(24)@       Latest  Blood pressure (!) 123/59, pulse 77, temperature 99.3 F (37.4 C), temperature source Oral, resp. rate 17, height '5\' 11"'  (1.803 m), weight 65 kg, SpO2 99 %.    Review of Systems:    Pertinent positives include:  Fevers, chills, fatigue,nausea, vomiting,  Constitutional:  No weight loss, night sweats, weight loss  HEENT:  No headaches, Difficulty swallowing,Tooth/dental problems,Sore throat,  No sneezing, itching, ear ache, nasal congestion, post nasal drip,  Cardio-vascular:  No chest pain,  Orthopnea, PND, anasarca, dizziness, palpitations.no Bilateral lower extremity swelling  GI:  No heartburn, indigestion, abdominal pain,  diarrhea, change in bowel habits, loss of appetite, melena, blood in stool, hematemesis Resp:  no shortness of breath at rest. No dyspnea on exertion, No excess mucus, no productive cough, No non-productive cough, No coughing up of blood.No change in color of mucus.No wheezing. Skin:  no rash or lesions. No jaundice GU:  no dysuria, change in color of urine, no urgency or frequency. No straining to urinate.  No flank pain.  Musculoskeletal:  No joint pain or no joint swelling. No decreased range of motion. No back pain.  Psych:  No change in mood or affect. No depression or anxiety. No memory loss.  Neuro: no localizing neurological complaints, no tingling, no weakness, no double vision, no gait abnormality, no slurred speech, no confusion  All systems reviewed and apart from [place] all are negative  Past Medical History:   Past Medical History:  Diagnosis Date  . Anemia 11/27/2010  . Cancer ([place])   . Chronic renal insufficiency, stage III (moderate) 06/17/2011   Due to myeloma & HTN  . CKD (chronic kidney disease), stage III 02/05/2011  . 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 Diarrhea 06/17/2011   Hospital admission 05/30/11 velcade toxicity? Infectious?  Marland Kitchen Hyperlipemia   . Hypertension, benign essential, goal below 140/90 11/27/2010  . Hypokalemia with normal acid-base  balance 07/29/2011  . Hypomagnesemia 07/29/2011  . Seasonal allergies      Past Surgical History:  Procedure Laterality Date  . COLONOSCOPY  07/26/2011   Procedure: COLONOSCOPY;  Surgeon: Cleotis Nipper, MD;  Location: [facility];  Service: Endoscopy;  Laterality: N/A;  . ESOPHAGOGASTRODUODENOSCOPY (EGD) WITH PROPOFOL Left 10/17/2016   Procedure: ESOPHAGOGASTRODUODENOSCOPY (EGD) WITH PROPOFOL;  Surgeon: Ronnette Juniper, MD;  Location: [facility] ENDOSCOPY;  Service: Gastroenterology;  Laterality:  Left;  . EYE SURGERY     bilateral cataract  surgery  . MULTIPLE EXTRACTIONS WITH ALVEOLOPLASTY N/A 05/27/2014   Procedure: Extraction of tooth #'s 1,6,7,8,10,11,13,21,22,23,24,25,26,27 and 28 with alveoloplasty;  Surgeon: Lenn Cal, DDS;  Location: [facility] ORS;  Service: Oral Surgery;  Laterality: N/A;  . porta cath      for chemotherapy- right chest area    Social History:  Ambulatory  independently     reports that he has never smoked. He has never used smokeless tobacco. He reports that he does not drink alcohol or use drugs.   Family History:   Family History  Problem Relation Age of Onset  . Cancer Brother        lung ca    Allergies: No Known Allergies   Prior to Admission medications   Medication Sig Start Date End Date Taking? Authorizing Provider  acyclovir (ZOVIRAX) 400 MG tablet Take 1 tablet (400 mg total) by mouth daily. 03/01/19   Heath Lark, MD  allopurinol (ZYLOPRIM) 100 MG tablet Take 100 mg by mouth 2 (two) times daily.  12/06/12   [provider]  amLODipine (NORVASC) 10 MG tablet Take 10 mg by mouth daily. 07/19/16   [provider]  aspirin EC 81 MG tablet Take 81 mg by mouth daily.    [provider]  atorvastatin (LIPITOR) 20 MG tablet Take 20 mg by mouth at bedtime.     [provider]  brimonidine (ALPHAGAN) 0.2 % ophthalmic solution Place 1 drop into the right eye 3 (three) times daily.  02/02/18   [provider]  calcium carbonate (TUMS - DOSED IN MG ELEMENTAL CALCIUM) 500 MG chewable tablet Chew 1 tablet by mouth daily as needed for indigestion or heartburn.    [provider]  cholecalciferol (VITAMIN D) 1000 UNITS tablet Take 1,000 Units by mouth daily.    [provider]  dexamethasone (DECADRON) 4 MG tablet Take 5 tabs on days 1 and 3 every week, take with food, preferably in the morning 03/01/19   Heath Lark, MD  dorzolamide-timolol (COSOPT) 22.3-6.8 MG/ML ophthalmic solution Place 1  drop into the right eye 2 (two) times daily.  07/29/13   [provider]  fluorometholone (FML) 0.1 % ophthalmic suspension Place 1 drop into the right eye daily.     [provider]  latanoprost (XALATAN) 0.005 % ophthalmic solution Place 1 drop into the right eye at bedtime.    [provider]  lidocaine-prilocaine (EMLA) cream Apply to affected area once 06/08/18   Heath Lark, MD  ondansetron (ZOFRAN) 8 MG tablet Take 1 tablet (8 mg total) by mouth every 8 (eight) hours as needed (Nausea or vomiting). 03/01/19   Heath Lark, MD  pantoprazole (PROTONIX) 40 MG tablet TAKE 1 TABLET BY MOUTH EVERY DAY 03/08/19   Heath Lark, MD  prochlorperazine (COMPAZINE) 10 MG tablet Take 1 tablet (10 mg total) by mouth every 6 (six) hours as needed for nausea or vomiting. 03/01/19   Heath Lark, MD  [name], 80 MG Twice  Weekly, 20 MG TBPK Take 80 mg by mouth 2 (two) times a week. Take 80 mg on days 1 and 3 every week 03/04/19   Heath Lark, MD   Physical Exam: Blood pressure (!) 123/59, pulse 77, temperature 99.3 F (37.4 C), temperature source Oral, resp. rate 17, height '5\' 11"'  (1.803 m), weight 65 kg, SpO2 99 %. 1. General:  in No  Acute distress   Chronically ill  -appearing 2. Psychological: Alert and Oriented 3. Head/ENT:     Dry Mucous Membranes                          Head Non traumatic, neck supple                            Poor Dentition 4. SKIN:  decreased Skin turgor,  Skin clean Dry and intact no rash 5. Heart: Regular rate and rhythm no Murmur, no Rub or gallop 6. Lungs:  no wheezes or crackles   7. Abdomen: Soft, non-tender, Non distended bowel sounds present 8. Lower extremities: no clubbing, cyanosis, no  edema 9. Neurologically Grossly intact, moving all 4 extremities equally   10. MSK: Normal range of motion   All other LABS:     Recent Labs  Lab 04/13/19 1002 04/18/19 2004  WBC 8.6 15.0*  NEUTROABS 5.6 12.9*  HGB 7.9* 9.6*  HCT 24.0* 29.3*  MCV 93.0  86.4  PLT 157 167     Recent Labs  Lab 04/13/19 1002 04/18/19 2004  NA 131* 133*  K 5.2* 4.2  CL 106 103  CO2 17* 19*  GLUCOSE 121* 96  BUN 14 20  CREATININE 2.21* 1.56*  CALCIUM 7.7* 7.4*     Recent Labs  Lab 04/13/19 1002 04/18/19 2004  AST 12* 23  ALT 11 40  ALKPHOS 64 50  BILITOT 0.6 0.8  PROT 7.1 6.7  ALBUMIN 3.0* 3.3*       Cultures:    Component Value Date/Time   SDES URINE, CLEAN CATCH 04/08/2015 0129   SPECREQUEST NONE 04/08/2015 0129   CULT  04/08/2015 0129    MULTIPLE SPECIES PRESENT, SUGGEST RECOLLECTION Performed at [facility] 04/09/2015 FINAL 04/08/2015 0129     Radiological Exams on Admission: DG Chest Port 1 View  Result Date: 04/18/2019 CLINICAL DATA:  Weakness, vomiting EXAM: PORTABLE CHEST 1 VIEW COMPARISON:  03/19/2018 FINDINGS: Mild patchy opacity in the right mid lung and bilateral lower lobes, suspicious for multifocal pneumonia. No pleural effusion or pneumothorax. The heart is normal in size. Right chest power port terminates at the cavoatrial junction. IMPRESSION: Multifocal patchy opacities, suspicious for pneumonia. Electronically Signed   By: Julian Hy M.D.   On: 04/18/2019 22:04    Chart has been reviewed    Assessment/Plan  74 y.o. male with medical history significant of relapsing multiple myeloma, anemia of chronic renal disease, CKD, chronic diarrhea, hypokalemia     Admitted for sepsis and multifocal PNA  Present on Admission:  Sepsis -  -SIRS criteria met with  elevated white blood cell count,  fever.    With evidence of end organ damage such as elevated lactic acid  -Most likely source being:  Pulmonary,   - Obtain serial lactic acid and procalcitonin level.  - Initiate IV antibiotics   - await results of blood and urine culture  - Rehydrate    . Community acquired  pneumonia -multifocal pneumonia patient states that he has not been vaccinated against Covid.  Covid currently  pending Admit as PUI .  Initiate antibiotics initially in the emergency department patient received broad-spectrum antibiotics including cefepime and vank.  We will continue with community-acquired pneumonia coverage Rocephin and azithromycin Obtain respiratory panel . Multiple myeloma in relapse [facility]) - notified Dr. Alvy Bimler that pt has been admitted  hold PO chemo  . CKD (chronic kidney disease), stage III - avoid nephrotoxic medications   . HTN (hypertension) -allow permissive hypertension for tonight restart home medications when able  . Anemia in chronic renal disease -chronic stable currently asymptomatic no indication for blood transfusion   Other plan as per orders.  DVT prophylaxis:  Lovenox     Code Status:  FULL CODE  as per patient  I had personally discussed CODE STATUS with patient    Family Communication:   Family not at  Bedside    Disposition Plan:   To home once workup is complete and patient is stable    Following barriers for discharge:                                                    Afebrile, white count improving able to transition to PO antibiotics                             Will need to be able to tolerate PO                                                                   Consults called:  Emailed Dr. Alvy Bimler  Admission status:  ED Disposition    None      Obs      Level of care     tele  For   24H     Precautions: admitted as  PUI   Airborne and Contact precautions  If Covid PCR is negative  - please DC precautions    PPE: Used by the provider:   P100  eye Goggles,  Gloves      Maddix Heinz 04/18/2019, 11:38 PM    [facility]     after 2 AM please page floor coverage PA If 7AM-7PM, please contact the day team taking care of the patient using [email or web address]   Patient was evaluated in the context of the global COVID-19 pandemic, which necessitated consideration that the patient might be at risk for infection with the  SARS-CoV-2 virus that causes COVID-19. Institutional protocols and algorithms that pertain to the evaluation of patients at risk for COVID-19 are in a state of rapid change based on information released by regulatory bodies including the CDC and federal and state organizations. These policies and algorithms were followed during the patient's care.

## 2019-04-18 NOTE — ED Provider Notes (Addendum)
[place] DEPT Provider Note   CSN: [id number] Arrival date & time: 04/18/19  1943     History Chief Complaint  Patient presents with  . Emesis  . Weakness    Calvin Mccormick is a 74 y.o. male.  HPI Patient with history of multiple medical issues including multiple myeloma, currently receiving oral chemotherapy, as well as chronic kidney disease, hypertension, hyperlipidemia presents with concern for fever , Altered mental status, weakness. The patient himself offers only minimal responses, seemingly denies pain, cannot provide any discernible history. Level 5 caveat secondary to acuity of condition. EMS reports the patient was at home, when he was having vomiting, and was brought here for evaluation.  EMS reports the patient was febrile in route received 1 mg almost of Tylenol, 4 mg of Zofran, and 200 mL of IV fluid.  Past Medical History:  Diagnosis Date  . Anemia 11/27/2010  . Cancer ([place])   . Chronic renal insufficiency, stage III (moderate) 06/17/2011   Due to myeloma & HTN  . CKD (chronic kidney disease), stage III 02/05/2011  . 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 Diarrhea 06/17/2011   Hospital admission 05/30/11 velcade toxicity? Infectious?  Marland Kitchen Hyperlipemia   . Hypertension, benign essential, goal below 140/90 11/27/2010  . Hypokalemia with normal acid-base balance 07/29/2011  . Hypomagnesemia 07/29/2011  . Seasonal allergies     Patient Active Problem List   Diagnosis Date Noted  . Hyperkalemia 04/13/2019  . Boil, leg 03/29/2019  . Generalized weakness 03/29/2019  . Drug-induced hyperglycemia 03/23/2019  . Shingles rash 02/15/2019  . Chronic diarrhea 09/28/2018  . Tooth infection 04/13/2018  . Iron deficiency anemia 02/06/2017  . Dry skin dermatitis 02/06/2017  . Esophagitis, acute 12/12/2016  . Acute blood loss anemia 10/18/2016  . Aspiration pneumonia ([place]) 10/16/2016  . AKI (acute kidney injury) ([place]) 10/16/2016  . Upper GI bleed  10/16/2016  . Gait instability 05/22/2016  . Physical debility 05/22/2016  . Pancytopenia, acquired ([place]) 03/27/2016  . Goals of care, counseling/discussion 02/25/2016  . Port catheter in place 01/15/2016  . Acute neck pain 11/23/2015  . Renal failure 04/08/2015  . Acute renal failure (ARF) ([place]) 04/08/2015  . Acute renal failure superimposed on stage 3 chronic kidney disease ([place]) 04/08/2015  . Nausea and vomiting 04/08/2015  . Hypocalcemia 04/08/2015  . Pancytopenia due to antineoplastic chemotherapy ([place]) 11/15/2014  . 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 Protein calorie malnutrition ([place]) 10/06/2014  . 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 Cellulitis and abscess of hand 08/05/2011  . Hypomagnesemia 07/29/2011  . Hypokalemia with normal acid-base balance 07/29/2011  . HTN (hypertension) 07/24/2011  . Anemia in chronic renal disease 07/24/2011  . Diarrhea 06/17/2011  . Chronic renal insufficiency, stage III (moderate) 06/17/2011  . Hypokalemia 05/30/2011  . CKD (chronic kidney disease), stage III ([place]) 02/05/2011  . Multiple myeloma in relapse ([place]) 12/12/2010  . 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 COLONOSCOPY  07/26/2011   Procedure: COLONOSCOPY;  Surgeon: Cleotis Nipper, MD;  Location: [facility] END[facility]COPY;  Service: Endoscopy;  Laterality: N/A;  . ESOPHAGOGASTRODUODENOSCOPY (EGD) WITH PROPOFOL Left 10/17/2016   Procedure: ESOPHAGOGASTRODUODENOSCOPY (EGD) WITH PROPOFOL;  Surgeon: Ronnette Juniper, MD;  Location: [facility] ENDOSCOPY;  Service: Gastroenterology;  Laterality: Left;  . EYE SURGERY     bilateral cataract  surgery  . MULTIPLE EXTRACTIONS WITH ALVEOLOPLASTY N/A 05/27/2014   Procedure: Extraction of tooth #'s 1,6,7,8,10,11,13,21,22,23,24,25,26,27 and 28 with alveoloplasty;  Surgeon: Lenn Cal, DDS;  Location: [facility] ORS;  Service: Oral Surgery;  Laterality: N/A;  . porta cath      for chemotherapy- right  chest area       Family History  Problem Relation Age of Onset  . Cancer Brother        lung ca    Social History   Tobacco Use  . Smoking status: Never Smoker  . Smokeless tobacco: Never Used  Substance Use Topics  . Alcohol use: No  . Drug use: No    Home Medications Prior to Admission medications   Medication Sig Start Date End Date Taking? Authorizing Provider  acyclovir (ZOVIRAX) 400 MG tablet Take 1 tablet (400 mg total) by mouth daily. 03/01/19   Heath Lark, MD  allopurinol (ZYLOPRIM) 100 MG tablet Take 100 mg by mouth 2 (two) times daily.  12/06/12   [provider]  amLODipine (NORVASC) 10 MG tablet Take 10 mg by mouth daily. 07/19/16   [provider]  aspirin EC 81 MG tablet Take 81 mg by mouth daily.    [provider]  atorvastatin (LIPITOR) 20 MG tablet Take 20 mg by mouth at bedtime.     [provider]  brimonidine (ALPHAGAN) 0.2 % ophthalmic solution Place 1 drop into the right eye 3 (three) times daily.  02/02/18   [provider]  calcium carbonate (TUMS - DOSED IN MG ELEMENTAL CALCIUM) 500 MG chewable tablet Chew 1 tablet by mouth daily as needed for indigestion or heartburn.    [provider]  cholecalciferol (VITAMIN D) 1000 UNITS tablet Take 1,000 Units by mouth daily.    [provider]  dexamethasone (DECADRON) 4 MG tablet Take 5 tabs on days 1 and 3 every week, take with food, preferably in the morning 03/01/19   Heath Lark, MD  dorzolamide-timolol (COSOPT) 22.3-6.8 MG/ML ophthalmic solution Place 1 drop into the right eye 2 (two) times daily.  07/29/13   [provider]  fluorometholone (FML) 0.1 % ophthalmic suspension Place 1 drop into the right eye daily.     [provider]  latanoprost (XALATAN) 0.005 % ophthalmic solution Place 1 drop into the right eye at bedtime.    [provider]  lidocaine-prilocaine (EMLA) cream Apply to affected area once 06/08/18    Heath Lark, MD  ondansetron (ZOFRAN) 8 MG tablet Take 1 tablet (8 mg total) by mouth every 8 (eight) hours as needed (Nausea or vomiting). 03/01/19   Heath Lark, MD  pantoprazole (PROTONIX) 40 MG tablet TAKE 1 TABLET BY MOUTH EVERY DAY 03/08/19   Heath Lark, MD  prochlorperazine (COMPAZINE) 10 MG tablet Take 1 tablet (10 mg total) by mouth every 6 (six) hours as needed for nausea or vomiting. 03/01/19   Heath Lark, MD  [name], 80 MG Twice Weekly, 20 MG TBPK Take 80 mg by mouth 2 (two) times a week. Take 80 mg on days 1 and 3 every week 03/04/19   Heath Lark, MD    Allergies    Patient has no known allergies.  Review of Systems   Review of Systems  Unable to perform ROS: Acuity of condition    Physical Exam Updated Vital Signs BP (!) 146/75   Pulse 90   Temp (!) 102.4 F (39.1 C) (Rectal)   Resp 20   Ht _0  (1.803 m)   Wt 65 kg   SpO2 100%   BMI 19.99 kg/m   Physical Exam Vitals and nursing  note reviewed.  Constitutional:      Appearance: He is well-developed. He is ill-appearing.     Comments: Withdrawn, gaunt elderly male minimally interactive  HENT:     Head: Normocephalic and atraumatic.  Eyes:     Conjunctiva/sclera: Conjunctivae normal.  Cardiovascular:     Rate and Rhythm: Normal rate and regular rhythm.  Pulmonary:     Effort: Pulmonary effort is normal. No respiratory distress.     Breath sounds: No stridor.  Abdominal:     General: There is no distension.     Tenderness: There is no guarding.  Skin:    General: Skin is warm and dry.  Neurological:     Motor: Weakness and atrophy present.     Comments: Patient minimally interactive, does not answer questions when having direct stimuli, otherwise non participatory. Diffuse atrophy is appreciable, but patient does move all extremities spontaneously.  No facial asymmetry.  Psychiatric:        Cognition and Memory: Cognition is impaired.     ED Results / Procedures / Treatments   Labs (all labs  ordered are listed, but only abnormal results are displayed) Labs Reviewed  COMPREHENSIVE METABOLIC PANEL - Abnormal; Notable for the following components:      Result Value   Sodium 133 (*)    CO2 19 (*)    Creatinine, Ser 1.56 (*)    Calcium 7.4 (*)    Albumin 3.3 (*)    GFR calc non Af Amer 43 (*)    GFR calc Af Amer 49 (*)    All other components within normal limits  CBC WITH DIFFERENTIAL/PLATELET - Abnormal; Notable for the following components:   WBC 15.0 (*)    RBC 3.39 (*)    Hemoglobin 9.6 (*)    HCT 29.3 (*)    RDW 25.1 (*)    nRBC 2.5 (*)    All other components within normal limits  CULTURE, BLOOD (ROUTINE X 2)  CULTURE, BLOOD (ROUTINE X 2)  URINE CULTURE  ETHANOL  LIPASE, BLOOD  URINALYSIS, ROUTINE W REFLEX MICROSCOPIC  LACTIC ACID, PLASMA  RAPID URINE DRUG SCREEN, HOSP PERFORMED  POC SARS CORONAVIRUS 2 AG -  ED    EKG EKG Interpretation  Date/Time:  Sunday April 18 2019 20:39:40 EDT Ventricular Rate:  94 PR Interval:    QRS Duration: 91 QT Interval:  346 QTC Calculation: 433 R Axis:   -16 Text Interpretation: Sinus rhythm Probable left atrial enlargement Borderline left axis deviation Artifact in lead(s) I II III aVR aVL aVF V1 V2 V3 V4 V5 V6 no gross cahnges from prior, though there is substantial artefact. Abnormal ECG Confirmed by Carmin Muskrat [phone]) on 04/18/2019 9:14:33 PM   Radiology DG Chest Port 1 View  Result Date: 04/18/2019 CLINICAL DATA:  Weakness, vomiting EXAM: PORTABLE CHEST 1 VIEW COMPARISON:  03/19/2018 FINDINGS: Mild patchy opacity in the right mid lung and bilateral lower lobes, suspicious for multifocal pneumonia. No pleural effusion or pneumothorax. The heart is normal in size. Right chest power port terminates at the cavoatrial junction. IMPRESSION: Multifocal patchy opacities, suspicious for pneumonia. Electronically Signed   By: Julian Hy M.D.   On: 04/18/2019 22:04    Procedures Procedures (including critical care  time) CRITICAL CARE Performed by: Carmin Muskrat Total critical care time: 35 minutes Critical care time was exclusive of separately billable procedures and treating other patients. Critical care was necessary to treat or prevent imminent or life-threatening deterioration. Critical care was time spent personally  by me on the following activities: development of treatment plan with patient and/or surrogate as well as nursing, discussions with consultants, evaluation of patient's response to treatment, examination of patient, obtaining history from patient or surrogate, ordering and performing treatments and interventions, ordering and review of laboratory studies, ordering and review of radiographic studies, pulse oximetry and re-evaluation of patient's condition.   Medications Ordered in ED Medications  sodium chloride 0.9 % bolus 1,000 mL (1,000 mLs Intravenous New Bag/Given 04/18/19 2128)    And  0.9 %  sodium chloride infusion (has no administration in time range)  ceFEPIme (MAXIPIME) 2 g in sodium chloride 0.9 % 100 mL IVPB (has no administration in time range)  vancomycin (VANCOCIN) IVPB 1000 mg/200 mL premix (has no administration in time range)  sodium chloride 0.9 % bolus 1,000 mL (1,000 mLs Intravenous New Bag/Given 04/18/19 2022)    ED Course  I have reviewed the triage vital signs and the nursing notes.  Pertinent labs & imaging results that were available during my care of the patient were reviewed by me and considered in my medical decision making (see chart for details).   After the initial evaluation with consideration of the patient's weakness, nausea, vomiting in the context of an oncologic therapy program, broad differential including infection, electrolyte abnormalities, dehydration, sepsis, Sirs all considered. Patient is initially febrile, greater than 102, but not tachycardic, not hypotensive, with a qSOFA of 1. Patient received Tylenol prior to ED arrival.  On  reviewing the patient's chart after the initial interventions clear the patient has been poorly tolerating his recent chemotherapy, with notation from his physician as below: This is from a office note within the past week:  "ASSESSMENT & PLAN:  Multiple myeloma in relapse [facility]) Patient continues to tolerate treatment poorly, partly due to poor understanding of his disease process and treatment of complications or side effects"  Patient also found to have worsening renal function, electrolyte abnormalities recently in the office.  Patient's initial labs notable for leukocytosis of 15,000 and with initial fever he meets SIRS criteria.  No obvious initial source of infection, but given his relatively immunocompromised status, patient will start broad-spectrum antibiotics, be designated as a code sepsis, require admission.  9:45 PM Patient measurably better than on arrival, now more awake, answering questions briefly, but appropriately, denies pain.  He is now accompanied by his wife who assists with history, notes that he has been feeling weak, has had episodes of vomiting, and a fall.  Currently the patient denies any pain including abdominal pain when specifically asked about this.  However, given evidence, as above for infection, sepsis, he will be admitted, may require additional serial exams versus imaging studies.  Patient's x-ray, reviewed by myself does suggest bilateral pneumonia in the upper lobes.  MDM Rules/Calculators/A&P This adult male with ongoing therapy for multiple myeloma presents febrile, essentially nonverbal from home. Patient has no obvious source of infection, but given concern for leukocytosis, fever, he was designated as a code sepsis, received initiation of broad-spectrum antibiotics.  Initial labs notable for elevated creatinine, slight diminished from his most recent value.  X-ray not initially available secondary to computer error, but the patient is not hypoxic. But the  patient is not hypoxic. Given concern for sepsis, patient required admission for further monitoring, management. COVID pending Final Clinical Impression(s) / ED Diagnoses Final diagnoses:  Severe sepsis [facility])      Carmin Muskrat, MD 04/18/19 2211

## 2019-04-18 NOTE — ED Triage Notes (Signed)
Pt arrived via [place] from home CC Weakness and vomiting X 3 days.    20G Left AC 200 ml NS. 975 mg Tylenol 4mg  Zofran IV  Given in route.   Hx cancer ( taking oral chemo). EMS unable to provide further history at this time.

## 2019-04-18 NOTE — ED Notes (Signed)
ED Provider at bedside. 

## 2019-04-18 NOTE — ED Provider Notes (Signed)
Patient seen and evaluated by Dr. Vanita Panda for fever, bilateral PNA, with leukocytosis, elevated lactic acid, h/o multiple myeloma on oral chemo. Needs admission for ongoing antibiotics given septic picture.   Discussed with Dr. Roel Cluck who is requesting POC COVID as PNA appears multi-focal. No documented history of COVID. Vaccination unknown. POC ordered. Dr. Roel Cluck accepting the patient for admission.      Charlann Lange, PA-C 04/18/19 2235    Carmin Muskrat, MD 04/19/19 [phone]

## 2019-04-19 DIAGNOSIS — N183 Chronic kidney disease, stage 3 unspecified: Secondary | ICD-10-CM | POA: Diagnosis not present

## 2019-04-19 DIAGNOSIS — I10 Essential (primary) hypertension: Secondary | ICD-10-CM | POA: Diagnosis not present

## 2019-04-19 DIAGNOSIS — C9000 Multiple myeloma not having achieved remission: Secondary | ICD-10-CM | POA: Diagnosis not present

## 2019-04-19 DIAGNOSIS — Z9221 Personal history of antineoplastic chemotherapy: Secondary | ICD-10-CM | POA: Diagnosis not present

## 2019-04-19 DIAGNOSIS — J189 Pneumonia, unspecified organism: Secondary | ICD-10-CM | POA: Diagnosis not present

## 2019-04-19 DIAGNOSIS — N1831 Chronic kidney disease, stage 3a: Secondary | ICD-10-CM | POA: Diagnosis not present

## 2019-04-19 DIAGNOSIS — E878 Other disorders of electrolyte and fluid balance, not elsewhere classified: Secondary | ICD-10-CM | POA: Diagnosis not present

## 2019-04-19 DIAGNOSIS — J181 Lobar pneumonia, unspecified organism: Secondary | ICD-10-CM

## 2019-04-19 DIAGNOSIS — Z79899 Other long term (current) drug therapy: Secondary | ICD-10-CM | POA: Diagnosis not present

## 2019-04-19 DIAGNOSIS — Z7982 Long term (current) use of aspirin: Secondary | ICD-10-CM | POA: Diagnosis not present

## 2019-04-19 DIAGNOSIS — E440 Moderate protein-calorie malnutrition: Secondary | ICD-10-CM

## 2019-04-19 DIAGNOSIS — Z7952 Long term (current) use of systemic steroids: Secondary | ICD-10-CM | POA: Diagnosis not present

## 2019-04-19 DIAGNOSIS — L8990 Pressure ulcer of unspecified site, unspecified stage: Secondary | ICD-10-CM | POA: Insufficient documentation

## 2019-04-19 DIAGNOSIS — I129 Hypertensive chronic kidney disease with stage 1 through stage 4 chronic kidney disease, or unspecified chronic kidney disease: Secondary | ICD-10-CM | POA: Diagnosis present

## 2019-04-19 DIAGNOSIS — Z20822 Contact with and (suspected) exposure to covid-19: Secondary | ICD-10-CM | POA: Diagnosis present

## 2019-04-19 DIAGNOSIS — L89150 Pressure ulcer of sacral region, unstageable: Secondary | ICD-10-CM | POA: Diagnosis not present

## 2019-04-19 DIAGNOSIS — E876 Hypokalemia: Secondary | ICD-10-CM | POA: Diagnosis present

## 2019-04-19 DIAGNOSIS — C9002 Multiple myeloma in relapse: Secondary | ICD-10-CM | POA: Diagnosis not present

## 2019-04-19 DIAGNOSIS — J13 Pneumonia due to Streptococcus pneumoniae: Secondary | ICD-10-CM | POA: Diagnosis not present

## 2019-04-19 DIAGNOSIS — E039 Hypothyroidism, unspecified: Secondary | ICD-10-CM | POA: Diagnosis not present

## 2019-04-19 DIAGNOSIS — J154 Pneumonia due to other streptococci: Secondary | ICD-10-CM | POA: Diagnosis present

## 2019-04-19 DIAGNOSIS — D61818 Other pancytopenia: Secondary | ICD-10-CM | POA: Diagnosis not present

## 2019-04-19 DIAGNOSIS — L89152 Pressure ulcer of sacral region, stage 2: Secondary | ICD-10-CM | POA: Diagnosis present

## 2019-04-19 DIAGNOSIS — Z681 Body mass index (BMI) 19 or less, adult: Secondary | ICD-10-CM | POA: Diagnosis not present

## 2019-04-19 DIAGNOSIS — A419 Sepsis, unspecified organism: Secondary | ICD-10-CM | POA: Diagnosis not present

## 2019-04-19 DIAGNOSIS — E785 Hyperlipidemia, unspecified: Secondary | ICD-10-CM | POA: Diagnosis present

## 2019-04-19 DIAGNOSIS — D631 Anemia in chronic kidney disease: Secondary | ICD-10-CM | POA: Diagnosis present

## 2019-04-19 DIAGNOSIS — E43 Unspecified severe protein-calorie malnutrition: Secondary | ICD-10-CM | POA: Diagnosis present

## 2019-04-19 DIAGNOSIS — R6520 Severe sepsis without septic shock: Secondary | ICD-10-CM | POA: Diagnosis present

## 2019-04-19 LAB — COMPREHENSIVE METABOLIC PANEL
ALT: 27 U/L (ref 0–44)
AST: 14 U/L — ABNORMAL LOW (ref 15–41)
Albumin: 2.5 g/dL — ABNORMAL LOW (ref 3.5–5.0)
Alkaline Phosphatase: 34 U/L — ABNORMAL LOW (ref 38–126)
Anion gap: 12 (ref 5–15)
BUN: 19 mg/dL (ref 8–23)
CO2: 19 mmol/L — ABNORMAL LOW (ref 22–32)
Calcium: 6.7 mg/dL — ABNORMAL LOW (ref 8.9–10.3)
Chloride: 102 mmol/L (ref 98–111)
Creatinine, Ser: 1.42 mg/dL — ABNORMAL HIGH (ref 0.61–1.24)
GFR calc Af Amer: 55 mL/min — ABNORMAL LOW (ref >60)
GFR calc non Af Amer: 48 mL/min — ABNORMAL LOW (ref >60)
Glucose, Bld: 91 mg/dL (ref 70–99)
Potassium: 3.9 mmol/L (ref 3.5–5.1)
Sodium: 133 mmol/L — ABNORMAL LOW (ref 135–145)
Total Bilirubin: 0.7 mg/dL (ref 0.3–1.2)
Total Protein: 5.3 g/dL — ABNORMAL LOW (ref 6.5–8.1)

## 2019-04-19 LAB — LACTIC ACID, PLASMA
Lactic Acid, Venous: 1.9 mmol/L (ref 0.5–1.9)
Lactic Acid, Venous: 1.5 mmol/L (ref 0.5–1.9)

## 2019-04-19 LAB — CBC
HCT: 24.5 % — ABNORMAL LOW (ref 39.0–52.0)
Hemoglobin: 8 g/dL — ABNORMAL LOW (ref 13.0–17.0)
MCH: 28 pg (ref 26.0–34.0)
MCHC: 32.7 g/dL (ref 30.0–36.0)
MCV: 85.7 fL (ref 80.0–100.0)
Platelets: 120 10*3/uL — ABNORMAL LOW (ref 150–400)
RBC: 2.86 MIL/uL — ABNORMAL LOW (ref 4.22–5.81)
RDW: 25.1 % — ABNORMAL HIGH (ref 11.5–15.5)
WBC: 15.8 10*3/uL — ABNORMAL HIGH (ref 4.0–10.5)
nRBC: 0.5 % — ABNORMAL HIGH (ref 0.0–0.2)

## 2019-04-19 LAB — RESPIRATORY PANEL BY RT PCR (FLU A&B, COVID)
Influenza A by PCR: NEGATIVE
Influenza B by PCR: NEGATIVE
SARS Coronavirus 2 by RT PCR: NEGATIVE

## 2019-04-19 LAB — MAGNESIUM: Magnesium: 1 mg/dL — ABNORMAL LOW (ref 1.7–2.4)

## 2019-04-19 LAB — STREP PNEUMONIAE URINARY ANTIGEN: Strep Pneumo Urinary Antigen: POSITIVE — AB

## 2019-04-19 LAB — TSH: TSH: 2.716 u[IU]/mL (ref 0.350–4.500)

## 2019-04-19 LAB — PHOSPHORUS: Phosphorus: 3.8 mg/dL (ref 2.5–4.6)

## 2019-04-19 MED ORDER — SODIUM CHLORIDE 0.9% FLUSH
10.0000 mL | Freq: Two times a day (BID) | INTRAVENOUS | Status: DC
  Administered 2019-04-20 (×2): 10 mL

## 2019-04-19 MED ORDER — PANTOPRAZOLE SODIUM 40 MG PO TBEC
40.0000 mg | DELAYED_RELEASE_TABLET | Freq: Every day | ORAL | Status: DC
  Administered 2019-04-19 – 2019-04-22 (×4): 40 mg via ORAL
  Filled 2019-04-19 (×4): qty 1

## 2019-04-19 MED ORDER — SODIUM CHLORIDE 0.9 % IV SOLN
2.0000 g | INTRAVENOUS | Status: DC
  Administered 2019-04-19 – 2019-04-22 (×4): 2 g via INTRAVENOUS
  Filled 2019-04-19: qty 20
  Filled 2019-04-19 (×3): qty 2

## 2019-04-19 MED ORDER — LEVOTHYROXINE SODIUM 50 MCG PO TABS
75.0000 ug | ORAL_TABLET | Freq: Every day | ORAL | Status: DC
  Administered 2019-04-19 – 2019-04-22 (×4): 75 ug via ORAL
  Filled 2019-04-19 (×4): qty 1

## 2019-04-19 MED ORDER — FLUOROMETHOLONE 0.1 % OP SUSP
1.0000 [drp] | Freq: Every day | OPHTHALMIC | Status: DC
  Administered 2019-04-19 – 2019-04-22 (×4): 1 [drp] via OPHTHALMIC
  Filled 2019-04-19: qty 5

## 2019-04-19 MED ORDER — SODIUM CHLORIDE 0.9 % IV SOLN
500.0000 mg | INTRAVENOUS | Status: DC
  Administered 2019-04-19: 500 mg via INTRAVENOUS
  Filled 2019-04-19: qty 500

## 2019-04-19 MED ORDER — DORZOLAMIDE HCL-TIMOLOL MAL 2-0.5 % OP SOLN
1.0000 [drp] | Freq: Two times a day (BID) | OPHTHALMIC | Status: DC
  Administered 2019-04-19 – 2019-04-22 (×7): 1 [drp] via OPHTHALMIC
  Filled 2019-04-19: qty 10

## 2019-04-19 MED ORDER — ASPIRIN EC 81 MG PO TBEC
81.0000 mg | DELAYED_RELEASE_TABLET | Freq: Every day | ORAL | Status: DC
  Administered 2019-04-19 – 2019-04-22 (×4): 81 mg via ORAL
  Filled 2019-04-19 (×4): qty 1

## 2019-04-19 MED ORDER — ENOXAPARIN SODIUM 40 MG/0.4ML ~~LOC~~ SOLN
40.0000 mg | Freq: Every day | SUBCUTANEOUS | Status: DC
  Administered 2019-04-19 – 2019-04-22 (×4): 40 mg via SUBCUTANEOUS
  Filled 2019-04-19 (×4): qty 0.4

## 2019-04-19 MED ORDER — ONDANSETRON HCL 4 MG PO TABS: 8.0000 mg | ORAL_TABLET | Freq: Three times a day (TID) | ORAL | Status: DC | PRN

## 2019-04-19 MED ORDER — LATANOPROST 0.005 % OP SOLN
1.0000 [drp] | Freq: Every day | OPHTHALMIC | Status: DC
  Administered 2019-04-19 – 2019-04-21 (×3): 1 [drp] via OPHTHALMIC
  Filled 2019-04-19: qty 2.5

## 2019-04-19 MED ORDER — HYDROCODONE-ACETAMINOPHEN 5-325 MG PO TABS
1.0000 | ORAL_TABLET | Freq: Four times a day (QID) | ORAL | Status: DC | PRN
  Administered 2019-04-19 – 2019-04-21 (×3): 2 via ORAL
  Filled 2019-04-19 (×4): qty 2

## 2019-04-19 MED ORDER — BRIMONIDINE TARTRATE 0.2 % OP SOLN
1.0000 [drp] | Freq: Three times a day (TID) | OPHTHALMIC | Status: DC
  Administered 2019-04-19 – 2019-04-22 (×11): 1 [drp] via OPHTHALMIC
  Filled 2019-04-19: qty 5

## 2019-04-19 MED ORDER — ALLOPURINOL 100 MG PO TABS
100.0000 mg | ORAL_TABLET | Freq: Two times a day (BID) | ORAL | Status: DC
  Administered 2019-04-19 – 2019-04-22 (×7): 100 mg via ORAL
  Filled 2019-04-19 (×7): qty 1

## 2019-04-19 MED ORDER — SODIUM CHLORIDE 0.9% FLUSH
10.0000 mL | INTRAVENOUS | Status: DC | PRN
  Administered 2019-04-20: 10 mL

## 2019-04-19 MED ORDER — ATORVASTATIN CALCIUM 20 MG PO TABS
20.0000 mg | ORAL_TABLET | Freq: Every day | ORAL | Status: DC
  Administered 2019-04-19 – 2019-04-21 (×3): 20 mg via ORAL
  Filled 2019-04-19 (×3): qty 1

## 2019-04-19 MED ORDER — ACYCLOVIR 400 MG PO TABS
400.0000 mg | ORAL_TABLET | Freq: Every day | ORAL | Status: DC
  Administered 2019-04-19 – 2019-04-22 (×4): 400 mg via ORAL
  Filled 2019-04-19 (×4): qty 1

## 2019-04-19 MED ORDER — CHLORHEXIDINE GLUCONATE CLOTH 2 % EX PADS
6.0000 | MEDICATED_PAD | Freq: Every day | CUTANEOUS | Status: DC
  Administered 2019-04-19 – 2019-04-22 (×4): 6 via TOPICAL

## 2019-04-19 MED ORDER — SODIUM CHLORIDE 0.9 % IV SOLN: INTRAVENOUS | Status: AC

## 2019-04-19 NOTE — Progress Notes (Signed)
RN notified speciality pharmacy that medication, [name], is currently on hold per MD recommendations.

## 2019-04-19 NOTE — Progress Notes (Signed)
Calvin Mccormick   DOB:1945/11/12   DP#:[id number]    ASSESSMENT & PLAN:  Multiple myeloma His recent myeloma panel showed positive response to treatment but due to his frail status and multiple complications, I will put his treatment on hold Continue supportive care  Acquired pancytopenia Due to treatment side effects He is frail with multiple comorbidities I recommend blood transfusion support if hemoglobin is less than 8  Chronic kidney disease stage III Continue IV support  Possible multilobar pneumonia Agree with broad-spectrum IV antibiotics  Multiple electrolyte imbalances Replace as needed  Moderate to severe protein calorie malnutrition Recommend nutritional supplement as tolerated  Code Status Full code  Goals of care Resolution of fever  Discharge planning He will likely be here for 2 to 3 days I will follow His appointment in the clinic tomorrow has been canceled  All questions were answered. The patient knows to call the clinic with any problems, questions or concerns.   The total time spent in the appointment was 25 minutes encounter with patients including review of chart and various tests results, discussions about plan of care and coordination of care plan  Heath Lark, MD 04/19/2019 8:22 AM  Subjective:  The patient is well-known to me I have been seeing him every week for multiple myeloma He was brought into the emergency department with confusion, vomiting and fever He has been very weak and had a fall He was started on broad-spectrum IV antibiotics Chest x-ray showed possible pneumonia This morning, he appears alert and oriented He feels well He denies pain He denies recent diarrhea  Objective:  Vitals:   04/19/19 0330 04/19/19 0440  BP: 94/62 109/64  Pulse: 69 64  Resp: 17 20  Temp:  98 F (36.7 C)  SpO2: 98% 97%     Intake/Output Summary (Last 24 hours) at 04/19/2019 8343 Last data filed at 04/19/2019 0500 Gross per 24 hour  Intake  2572.76 ml  Output --  Net 2572.76 ml    GENERAL:alert, no distress and comfortable.  He looks frail SKIN: skin color, texture, turgor are normal, no rashes or significant lesions EYES: normal, Conjunctiva are pink and non-injected, sclera clear OROPHARYNX:no exudate, no erythema and lips, buccal mucosa, and tongue normal  NECK: supple, thyroid normal size, non-tender, without nodularity LYMPH:  no palpable lymphadenopathy in the cervical, axillary or inguinal LUNGS: clear to auscultation and percussion with normal breathing effort HEART: regular rate & rhythm and no murmurs and no lower extremity edema ABDOMEN:abdomen soft, non-tender and normal bowel sounds Musculoskeletal:no cyanosis of digits and no clubbing  NEURO: alert & oriented x 3 with fluent speech, no focal motor/sensory deficits   Labs:  Recent Labs    04/13/19 1002 04/18/19 2004 04/19/19 0500  NA 131* 133* 133*  K 5.2* 4.2 3.9  CL 106 103 102  CO2 17* 19* 19*  GLUCOSE 121* 96 91  BUN '14 20 19  ' CREATININE 2.21* 1.56* 1.42*  CALCIUM 7.7* 7.4* 6.7*  GFRNONAA 28* 43* 48*  GFRAA 32* 49* 55*  PROT 7.1 6.7 5.3*  ALBUMIN 3.0* 3.3* 2.5*  AST 12* 23 14*  ALT 11 40 27  ALKPHOS 64 50 34*  BILITOT 0.6 0.8 0.7    Studies:  DG Chest Port 1 View  Result Date: 04/18/2019 CLINICAL DATA:  Weakness, vomiting EXAM: PORTABLE CHEST 1 VIEW COMPARISON:  03/19/2018 FINDINGS: Mild patchy opacity in the right mid lung and bilateral lower lobes, suspicious for multifocal pneumonia. No pleural effusion or pneumothorax. The heart  is normal in size. Right chest power port terminates at the cavoatrial junction. IMPRESSION: Multifocal patchy opacities, suspicious for pneumonia. Electronically Signed   By: Julian Hy M.D.   On: 04/18/2019 22:04

## 2019-04-19 NOTE — Sepsis Progress Note (Signed)
Notified bedside nurse of need to draw repeat lactic acid. 

## 2019-04-19 NOTE — Progress Notes (Addendum)
PROGRESS NOTE    TESLA BOCHICCHIO  VFI:[id number] DOB: April 26, 1945 DOA: 04/18/2019 PCP: Lucianne Lei, MD   Brief Narrative:  Calvin Mccormick is a 74 y.o. male with medical history significant of relapsing multiple myeloma, anemia of chronic renal disease, CKD, chronic diarrhea, hypokalemia. Presented with confusion and vomiting possibly febrile -very poor historian, following morning reports he was having shortness of breath abdominal pain and possibly diarrhea. EMS in route received Tylenol Zofran and IV fluids patient himself unable to provide detailed history. Patient reports feeling very weak and has had a fall.  Otherwise denies any abdominal pain.   Assessment & Plan:   Active Problems:   Multiple myeloma in relapse (HCC)   CKD (chronic kidney disease), stage III   Chronic renal insufficiency, stage III (moderate)   HTN (hypertension)   Anemia in chronic renal disease   Community acquired pneumonia   CAP (community acquired pneumonia)   Sepsis due to community-acquired pneumonia, POA Patient meets criteria due to leukocytosis, fever, multifocal pneumonia on imaging Notable evidence of end organ damage given lactic acidosis, now improving Covid swab negative, influenza AMB also negative Continue ceftriaxone azithromycin given community-acquired pneumonia without recent hospitalization, declines aspiration/difficulty swallowing  Multiple myeloma in relapse ([place]) Dr. Alvy Bimler notified at admission, appreciate insight and recommendations Chemotherapy appears to be on hold for now given poor tolerance and frail status with recurrent infection  CKD3a - at baseline without AKI Lab Results  Component Value Date   CREATININE 1.42 (H) 04/19/2019   CREATININE 1.56 (H) 04/18/2019   CREATININE 2.21 (H) 04/13/2019   Essential HTN Resume home medications  Chronic anemia of chronic renal disease  Stable, appears to be at baseline Likely somewhat hemoconcentrated at intake, follow with  morning labs CBC Latest Ref Rng & Units 04/19/2019 04/18/2019 04/13/2019  WBC 4.0 - 10.5 K/uL 15.8(H) 15.0(H) 8.6  Hemoglobin 13.0 - 17.0 g/dL 8.0(L) 9.6(L) 7.9(L)  Hematocrit 39.0 - 52.0 % 24.5(L) 29.3(L) 24.0(L)  Platelets 150 - 400 K/uL 120(L) 167 157     DVT prophylaxis: Lovenox Code Status: Full Disposition Plan: Transition to inpatient status, remarkably poor p.o. intake, requiring IV fluids, IV antibiotics and close monitoring given respiratory infection in the setting of myeloma as above.    Antimicrobials:  Ceftriaxone, azithromycin, tentative 5-day course through 04/22/2019  Subjective: No acute issues or events overnight, patient continues to be somewhat poor historian, indicates ongoing shortness of breath, cough, dyspnea with exertion and poor p.o. intake with questionable episode of diarrhea.  Declines chest pain, nausea, vomiting, constipation, headache, fevers, chills.  Objective: Vitals:   04/19/19 0300 04/19/19 0330 04/19/19 0430 04/19/19 0440  BP: 119/63 94/62  109/64  Pulse: 74 69  64  Resp: _0 Temp:    98 F (36.7 C)  TempSrc:    Oral  SpO2: 98% 98%  97%  Weight:   67.1 kg   Height:        Intake/Output Summary (Last 24 hours) at 04/19/2019 0721 Last data filed at 04/19/2019 0500 Gross per 24 hour  Intake 2572.76 ml  Output --  Net 2572.76 ml   Filed Weights   04/18/19 2016 04/18/19 2236 04/19/19 0430  Weight: 65 kg 65.8 kg 67.1 kg    Examination:  General:  Pleasantly resting in bed, No acute distress. HEENT:  Normocephalic atraumatic.  Sclerae nonicteric, noninjected.  Extraocular movements intact bilaterally. Neck:  Without mass or deformity.  Trachea is midline. Lungs:  Clear to auscultate bilaterally without  rhonchi, wheeze, or rales. Heart:  Regular rate and rhythm.  Without murmurs, rubs, or gallops. Abdomen:  Soft, nontender, nondistended.  Without guarding or rebound. Extremities: Without cyanosis, clubbing, edema, or obvious  deformity. Vascular:  Dorsalis pedis and posterior tibial pulses palpable bilaterally. Skin:  Warm and dry, no erythema, no ulcerations.  Data Reviewed: I have personally reviewed following labs and imaging studies  CBC: Recent Labs  Lab 04/13/19 1002 04/18/19 2004 04/19/19 0500  WBC 8.6 15.0* 15.8*  NEUTROABS 5.6 12.9*  --   HGB 7.9* 9.6* 8.0*  HCT 24.0* 29.3* 24.5*  MCV 93.0 86.4 85.7  PLT 157 167 443*   Basic Metabolic Panel: Recent Labs  Lab 04/13/19 1002 04/18/19 2004 04/19/19 0500  NA 131* 133* 133*  K 5.2* 4.2 3.9  CL 106 103 102  CO2 17* 19* 19*  GLUCOSE 121* 96 91  BUN _0 CREATININE 2.21* 1.56* 1.42*  CALCIUM 7.7* 7.4* 6.7*  MG  --   --  1.0*  PHOS  --   --  3.8   GFR: Estimated Creatinine Clearance: 42 mL/min (A) (by C-G formula based on SCr of 1.42 mg/dL (H)). Liver Function Tests: Recent Labs  Lab 04/13/19 1002 04/18/19 2004 04/19/19 0500  AST 12* 23 14*  ALT 11 40 27  ALKPHOS 64 50 34*  BILITOT 0.6 0.8 0.7  PROT 7.1 6.7 5.3*  ALBUMIN 3.0* 3.3* 2.5*   Recent Labs  Lab 04/18/19 2004  LIPASE 46   No results for input(s): AMMONIA in the last 168 hours. Coagulation Profile: No results for input(s): INR, PROTIME in the last 168 hours. Cardiac Enzymes: No results for input(s): CKTOTAL, CKMB, CKMBINDEX, TROPONINI in the last 168 hours. BNP (last 3 results) No results for input(s): PROBNP in the last 8760 hours. HbA1C: No results for input(s): HGBA1C in the last 72 hours. CBG: No results for input(s): GLUCAP in the last 168 hours. Lipid Profile: No results for input(s): CHOL, HDL, LDLCALC, TRIG, CHOLHDL, LDLDIRECT in the last 72 hours. Thyroid Function Tests: No results for input(s): TSH, T4TOTAL, FREET4, T3FREE, THYROIDAB in the last 72 hours. Anemia Panel: No results for input(s): VITAMINB12, FOLATE, FERRITIN, TIBC, IRON, RETICCTPCT in the last 72 hours. Sepsis Labs: Recent Labs  Lab 04/18/19 2028 04/19/19 0110 04/19/19 0500    LATICACIDVEN 2.3* 1.9 1.5    Recent Results (from the past 240 hour(s))  Respiratory Panel by RT PCR (Flu A&B, Covid) - Nasopharyngeal Swab     Status: None   Collection Time: 04/19/19  2:13 AM   Specimen: Nasopharyngeal Swab  Result Value Ref Range Status   SARS Coronavirus 2 by RT PCR NEGATIVE NEGATIVE Final    Comment: (NOTE) SARS-CoV-2 target nucleic acids are NOT DETECTED. The SARS-CoV-2 RNA is generally detectable in upper respiratoy specimens during the acute phase of infection. The lowest concentration of SARS-CoV-2 viral copies this assay can detect is 131 copies/mL. A negative result does not preclude SARS-Cov-2 infection and should not be used as the sole basis for treatment or other patient management decisions. A negative result may occur with  improper specimen collection/handling, submission of specimen other than nasopharyngeal swab, presence of viral mutation(s) within the areas targeted by this assay, and inadequate number of viral copies (<131 copies/mL). A negative result must be combined with clinical observations, patient history, and epidemiological information. The expected result is Negative. Fact Sheet for Patients:  PinkCheek.be Fact Sheet for Healthcare Providers:  GravelBags.it This test is not yet ap  proved or cleared by the [place] and  has been authorized for detection and/or diagnosis of SARS-CoV-2 by FDA under an Emergency Use Authorization (EUA). This EUA will remain  in effect (meaning this test can be used) for the duration of the COVID-19 declaration under Section 564(b)(1) of the Act, 21 U.S.C. section 360bbb-3(b)(1), unless the authorization is terminated or revoked sooner.    Influenza A by PCR NEGATIVE NEGATIVE Final   Influenza B by PCR NEGATIVE NEGATIVE Final    Comment: (NOTE) The Xpert Xpress SARS-CoV-2/FLU/RSV assay is intended as an aid in  the diagnosis of  influenza from Nasopharyngeal swab specimens and  should not be used as a sole basis for treatment. Nasal washings and  aspirates are unacceptable for Xpert Xpress SARS-CoV-2/FLU/RSV  testing. Fact Sheet for Patients: PinkCheek.be Fact Sheet for Healthcare Providers: GravelBags.it This test is not yet approved or cleared by the [place] FDA and  has been authorized for detection and/or diagnosis of SARS-CoV-2 by  FDA under an Emergency Use Authorization (EUA). This EUA will remain  in effect (meaning this test can be used) for the duration of the  Covid-19 declaration under Section 564(b)(1) of the Act, 21  U.S.C. section 360bbb-3(b)(1), unless the authorization is  terminated or revoked. Performed at [facility], [place] [address] [place], [place] [address]          Radiology Studies: DG Chest Port 1 View  Result Date: 04/18/2019 CLINICAL DATA:  Weakness, vomiting EXAM: PORTABLE CHEST 1 VIEW COMPARISON:  03/19/2018 FINDINGS: Mild patchy opacity in the right mid lung and bilateral lower lobes, suspicious for multifocal pneumonia. No pleural effusion or pneumothorax. The heart is normal in size. Right chest power port terminates at the cavoatrial junction. IMPRESSION: Multifocal patchy opacities, suspicious for pneumonia. Electronically Signed   By: Julian Hy M.D.   On: 04/18/2019 22:04    Scheduled Meds: . acyclovir  400 mg Oral Daily  . allopurinol  100 mg Oral BID  . aspirin EC  81 mg Oral Daily  . atorvastatin  20 mg Oral QHS  . brimonidine  1 drop Right Eye TID  . Chlorhexidine Gluconate Cloth  6 each Topical Daily  . dorzolamide-timolol  1 drop Right Eye BID  . enoxaparin (LOVENOX) injection  40 mg Subcutaneous Daily  . fluorometholone  1 drop Right Eye Daily  . latanoprost  1 drop Right Eye QHS  . pantoprazole  40 mg Oral Daily  . sodium chloride flush  10-40 mL Intracatheter Q12H   Continuous  Infusions: . sodium chloride 125 mL/hr at 04/18/19 2242  . sodium chloride 75 mL/hr at 04/19/19 0443  . azithromycin 500 mg (04/19/19 0612)  . cefTRIAXone (ROCEPHIN)  IV 2 g (04/19/19 0532)     LOS: 0 days   Time spent: 89mn  Leza Apsey C Sulaiman Imbert, DO [facility]  If 7PM-7AM, please contact night-coverage [email or web address]  04/19/2019, 7:22 AM

## 2019-04-20 ENCOUNTER — Inpatient Hospital Stay: Payer: Medicare Other

## 2019-04-20 ENCOUNTER — Inpatient Hospital Stay: Payer: Medicare Other | Admitting: Hematology and Oncology

## 2019-04-20 ENCOUNTER — Other Ambulatory Visit: Payer: Self-pay

## 2019-04-20 DIAGNOSIS — J13 Pneumonia due to Streptococcus pneumoniae: Secondary | ICD-10-CM | POA: Diagnosis not present

## 2019-04-20 DIAGNOSIS — N183 Chronic kidney disease, stage 3 unspecified: Secondary | ICD-10-CM | POA: Diagnosis not present

## 2019-04-20 DIAGNOSIS — D61818 Other pancytopenia: Secondary | ICD-10-CM | POA: Diagnosis not present

## 2019-04-20 DIAGNOSIS — E878 Other disorders of electrolyte and fluid balance, not elsewhere classified: Secondary | ICD-10-CM | POA: Diagnosis not present

## 2019-04-20 LAB — BASIC METABOLIC PANEL
Anion gap: 7 (ref 5–15)
BUN: 13 mg/dL (ref 8–23)
CO2: 20 mmol/L — ABNORMAL LOW (ref 22–32)
Calcium: 6.4 mg/dL — CL (ref 8.9–10.3)
Chloride: 105 mmol/L (ref 98–111)
Creatinine, Ser: 1.25 mg/dL — ABNORMAL HIGH (ref 0.61–1.24)
GFR calc Af Amer: >60 mL/min (ref >60)
GFR calc non Af Amer: 56 mL/min — ABNORMAL LOW (ref >60)
Glucose, Bld: 85 mg/dL (ref 70–99)
Potassium: 2.7 mmol/L — CL (ref 3.5–5.1)
Sodium: 132 mmol/L — ABNORMAL LOW (ref 135–145)

## 2019-04-20 LAB — CBC
HCT: 21.9 % — ABNORMAL LOW (ref 39.0–52.0)
Hemoglobin: 7.2 g/dL — ABNORMAL LOW (ref 13.0–17.0)
MCH: 28.5 pg (ref 26.0–34.0)
MCHC: 32.9 g/dL (ref 30.0–36.0)
MCV: 86.6 fL (ref 80.0–100.0)
Platelets: 113 10*3/uL — ABNORMAL LOW (ref 150–400)
RBC: 2.53 MIL/uL — ABNORMAL LOW (ref 4.22–5.81)
RDW: 25.5 % — ABNORMAL HIGH (ref 11.5–15.5)
WBC: 13 10*3/uL — ABNORMAL HIGH (ref 4.0–10.5)
nRBC: 0.2 % (ref 0.0–0.2)

## 2019-04-20 LAB — URINE CULTURE

## 2019-04-20 LAB — PREPARE RBC (CROSSMATCH)

## 2019-04-20 MED ORDER — DIPHENHYDRAMINE HCL 25 MG PO CAPS
25.0000 mg | ORAL_CAPSULE | Freq: Once | ORAL | Status: AC
  Administered 2019-04-20: 25 mg via ORAL
  Filled 2019-04-20: qty 1

## 2019-04-20 MED ORDER — SODIUM CHLORIDE 0.9% IV SOLUTION: Freq: Once | INTRAVENOUS | Status: DC

## 2019-04-20 MED ORDER — CALCIUM CITRATE 950 (200 CA) MG PO TABS
200.0000 mg | ORAL_TABLET | Freq: Every day | ORAL | Status: AC
  Administered 2019-04-20 – 2019-04-21 (×2): 200 mg via ORAL
  Filled 2019-04-20 (×2): qty 1

## 2019-04-20 MED ORDER — POTASSIUM CHLORIDE 10 MEQ/100ML IV SOLN
10.0000 meq | INTRAVENOUS | Status: AC
  Administered 2019-04-20 (×4): 10 meq via INTRAVENOUS
  Filled 2019-04-20 (×4): qty 100

## 2019-04-20 MED ORDER — POTASSIUM CHLORIDE CRYS ER 20 MEQ PO TBCR
30.0000 meq | EXTENDED_RELEASE_TABLET | Freq: Two times a day (BID) | ORAL | Status: AC
  Administered 2019-04-20 (×2): 30 meq via ORAL
  Filled 2019-04-20 (×2): qty 1

## 2019-04-20 MED ORDER — ACETAMINOPHEN 325 MG PO TABS
650.0000 mg | ORAL_TABLET | Freq: Once | ORAL | Status: AC
  Administered 2019-04-20: 650 mg via ORAL
  Filled 2019-04-20: qty 2

## 2019-04-20 MED ORDER — CALCIUM GLUCONATE-NACL 2-0.675 GM/100ML-% IV SOLN
2.0000 g | Freq: Once | INTRAVENOUS | Status: AC
  Administered 2019-04-20: 2000 mg via INTRAVENOUS
  Filled 2019-04-20: qty 100

## 2019-04-20 NOTE — Progress Notes (Signed)
PROGRESS NOTE    Calvin Mccormick  VFI:[id number] DOB: 1945/09/21 DOA: 04/18/2019 PCP: Lucianne Lei, MD   Brief Narrative:  Calvin Mccormick is a 74 y.o. male with medical history significant of relapsing multiple myeloma, anemia of chronic renal disease, CKD, chronic diarrhea, hypokalemia. Presented with confusion and vomiting possibly febrile -very poor historian, following morning reports he was having shortness of breath abdominal pain and possibly diarrhea. EMS in route received Tylenol Zofran and IV fluids patient himself unable to provide detailed history. Patient reports feeling very weak and has had a fall.  Otherwise denies any abdominal pain.   Assessment & Plan:   Active Problems:   Multiple myeloma in relapse (HCC)   CKD (chronic kidney disease), stage III   Chronic renal insufficiency, stage III (moderate)   HTN (hypertension)   Anemia in chronic renal disease   Community acquired pneumonia   CAP (community acquired pneumonia)   Pressure injury of skin   Sepsis due to community-acquired pneumonia, POA Patient meets criteria due to leukocytosis, fever, multifocal pneumonia on imaging Notable evidence of end organ damage given lactic acidosis, now improving Covid swab negative, influenza AMB also negative Continue ceftriaxone azithromycin given community-acquired pneumonia without recent hospitalization, declines aspiration/difficulty swallowing  Multiple myeloma in relapse ([place]) Dr. Alvy Bimler following along - appreciate insight/recs Chemotherapy appears to be on hold for now given poor tolerance and frail status with recurrent infection  Acute hypokalemia,hypocalcemia Unclear if related to chemotherapy - less likely given time frame 2 g calcium gluconate given this morning Potassium supplementation ongoing (175mq planned today) follow morning labs Lab Results  Component Value Date   K 2.7 (LL) 04/20/2019    Acute symptomatic anemia on altered factorial chronic anemia of  chronic renal disease, chemotherapy, myeloma Patient continues to downtrend, Dr. GJinny Sandersis recommending transfusion, patient has difficulty crossmatching given previous exposure to daratumumab - transfusion pending CBC Latest Ref Rng & Units 04/20/2019 04/19/2019 04/18/2019  WBC 4.0 - 10.5 K/uL 13.0(H) 15.8(H) 15.0(H)  Hemoglobin 13.0 - 17.0 g/dL 7.2(L) 8.0(L) 9.6(L)  Hematocrit 39.0 - 52.0 % 21.9(L) 24.5(L) 29.3(L)  Platelets 150 - 400 K/uL 113(L) 120(L) 167   CKD3a - at baseline without AKI Lab Results  Component Value Date   CREATININE 1.25 (H) 04/20/2019   CREATININE 1.42 (H) 04/19/2019   CREATININE 1.56 (H) 04/18/2019   Essential HTN Resume home medications  DVT prophylaxis: Lovenox Code Status: Full Disposition Plan: Transition to inpatient status, remarkably poor p.o. intake, requiring IV fluids, IV antibiotics and close monitoring given respiratory infection in the setting of myeloma as above.    Antimicrobials:  Ceftriaxone, azithromycin, tentative 5-day course through 04/22/2019  Subjective: No acute issues or events overnight, patient continues to be somewhat poor historian, weak, indicates ongoing shortness of breath, cough, dyspnea with exertion and poor p.o. intake with no further episodes of diarrhea.  Declines chest pain, nausea, vomiting, constipation, headache, fevers, chills.  Objective: Vitals:   04/19/19 1022 04/19/19 1422 04/19/19 2138 04/20/19 0501  BP: 114/86 104/80 132/73 140/68  Pulse: 76 77 75 73  Resp:      Temp: 98 F (36.7 C) 99 F (37.2 C) 98.4 F (36.9 C) 98.7 F (37.1 C)  TempSrc: Axillary Oral Oral Oral  SpO2: 99% 96% 96% 96%  Weight:      Height:        Intake/Output Summary (Last 24 hours) at 04/20/2019 0717 Last data filed at 04/20/2019 0600 Gross per 24 hour  Intake 3126.71 ml  Output  2020 ml  Net 1106.71 ml   Filed Weights   04/18/19 2016 04/18/19 2236 04/19/19 0430  Weight: 65 kg 65.8 kg 67.1 kg    Examination:  General:  Cachectic appearing, pleasantly resting in bed, No acute distress. HEENT:  Normocephalic atraumatic.  Sclerae nonicteric, noninjected.  Extraocular movements intact bilaterally. Neck:  Without mass or deformity.  Trachea is midline. Lungs/chest:  Mild bibasilar rhonchi without overt wheeze or rales, right-sided port noted clean dry intact. Heart:  Regular rate and rhythm.  Without murmurs, rubs, or gallops. Abdomen:  Soft, nontender, nondistended.  Without guarding or rebound. Extremities: Without cyanosis, clubbing, edema, or obvious deformity. Vascular:  Dorsalis pedis and posterior tibial pulses palpable bilaterally. Skin:  Warm and dry, no erythema, no ulcerations.  Data Reviewed: I have personally reviewed following labs and imaging studies  CBC: Recent Labs  Lab 04/13/19 1002 04/18/19 2004 04/19/19 0500 04/20/19 0359  WBC 8.6 15.0* 15.8* 13.0*  NEUTROABS 5.6 12.9*  --   --   HGB 7.9* 9.6* 8.0* 7.2*  HCT 24.0* 29.3* 24.5* 21.9*  MCV 93.0 86.4 85.7 86.6  PLT 157 167 120* 573*   Basic Metabolic Panel: Recent Labs  Lab 04/13/19 1002 04/18/19 2004 04/19/19 0500 04/20/19 0359  NA 131* 133* 133* 132*  K 5.2* 4.2 3.9 2.7*  CL 106 103 102 105  CO2 17* 19* 19* 20*  GLUCOSE 121* 96 91 85  BUN '14 20 19 13  ' CREATININE 2.21* 1.56* 1.42* 1.25*  CALCIUM 7.7* 7.4* 6.7* 6.4*  MG  --   --  1.0*  --   PHOS  --   --  3.8  --    GFR: Estimated Creatinine Clearance: 47.7 mL/min (A) (by C-G formula based on SCr of 1.25 mg/dL (H)). Liver Function Tests: Recent Labs  Lab 04/13/19 1002 04/18/19 2004 04/19/19 0500  AST 12* 23 14*  ALT 11 40 27  ALKPHOS 64 50 34*  BILITOT 0.6 0.8 0.7  PROT 7.1 6.7 5.3*  ALBUMIN 3.0* 3.3* 2.5*   Recent Labs  Lab 04/18/19 2004  LIPASE 46   No results for input(s): AMMONIA in the last 168 hours. Coagulation Profile: No results for input(s): INR, PROTIME in the last 168 hours. Cardiac Enzymes: No results for input(s): CKTOTAL, CKMB,  CKMBINDEX, TROPONINI in the last 168 hours. BNP (last 3 results) No results for input(s): PROBNP in the last 8760 hours. HbA1C: No results for input(s): HGBA1C in the last 72 hours. CBG: No results for input(s): GLUCAP in the last 168 hours. Lipid Profile: No results for input(s): CHOL, HDL, LDLCALC, TRIG, CHOLHDL, LDLDIRECT in the last 72 hours. Thyroid Function Tests: Recent Labs    04/19/19 0500  TSH 2.716   Anemia Panel: No results for input(s): VITAMINB12, FOLATE, FERRITIN, TIBC, IRON, RETICCTPCT in the last 72 hours. Sepsis Labs: Recent Labs  Lab 04/18/19 2028 04/19/19 0110 04/19/19 0500  LATICACIDVEN 2.3* 1.9 1.5    Recent Results (from the past 240 hour(s))  Blood Cultures (routine x 2)     Status: None (Preliminary result)   Collection Time: 04/18/19  8:28 PM   Specimen: BLOOD  Result Value Ref Range Status   Specimen Description   Final    BLOOD LEFT ANTECUBITAL Performed at [facility] [address], [place], [place] [address]    Special Requests   Final    BOTTLES DRAWN AEROBIC AND ANAEROBIC Blood Culture results may not be optimal due to an excessive volume of blood received  in culture bottles Performed at [facility], [place] [address], [place], [place] [address]    Culture   Final    NO GROWTH < 12 HOURS Performed at [facility] [address] [place], [place] [address]    Report Status PENDING  Incomplete  Blood Cultures (routine x 2)     Status: None (Preliminary result)   Collection Time: 04/18/19  8:33 PM   Specimen: BLOOD  Result Value Ref Range Status   Specimen Description   Final    BLOOD PORTA CATH Performed at [facility] [address] [place], [place] [address]    Special Requests   Final    BOTTLES DRAWN AEROBIC AND ANAEROBIC Blood Culture adequate volume Performed at [facility] [address], [place], [place] [address]    Culture   Final     NO GROWTH < 12 HOURS Performed at [facility] [address], [place], [place] [address]    Report Status PENDING  Incomplete  Respiratory Panel by RT PCR (Flu A&B, Covid) - Nasopharyngeal Swab     Status: None   Collection Time: 04/19/19  2:13 AM   Specimen: Nasopharyngeal Swab  Result Value Ref Range Status   SARS Coronavirus 2 by RT PCR NEGATIVE NEGATIVE Final    Comment: (NOTE) SARS-CoV-2 target nucleic acids are NOT DETECTED. The SARS-CoV-2 RNA is generally detectable in upper respiratoy specimens during the acute phase of infection. The lowest concentration of SARS-CoV-2 viral copies this assay can detect is 131 copies/mL. A negative result does not preclude SARS-Cov-2 infection and should not be used as the sole basis for treatment or other patient management decisions. A negative result may occur with  improper specimen collection/handling, submission of specimen other than nasopharyngeal swab, presence of viral mutation(s) within the areas targeted by this assay, and inadequate number of viral copies (<131 copies/mL). A negative result must be combined with clinical observations, patient history, and epidemiological information. The expected result is Negative. Fact Sheet for Patients:  PinkCheek.be Fact Sheet for Healthcare Providers:  GravelBags.it This test is not yet ap proved or cleared by the [place] FDA and  has been authorized for detection and/or diagnosis of SARS-CoV-2 by FDA under an Emergency Use Authorization (EUA). This EUA will remain  in effect (meaning this test can be used) for the duration of the COVID-19 declaration under Section 564(b)(1) of the Act, 21 U.S.C. section 360bbb-3(b)(1), unless the authorization is terminated or revoked sooner.    Influenza A by PCR NEGATIVE NEGATIVE Final   Influenza B by PCR NEGATIVE NEGATIVE Final    Comment: (NOTE) The Xpert Xpress  SARS-CoV-2/FLU/RSV assay is intended as an aid in  the diagnosis of influenza from Nasopharyngeal swab specimens and  should not be used as a sole basis for treatment. Nasal washings and  aspirates are unacceptable for Xpert Xpress SARS-CoV-2/FLU/RSV  testing. Fact Sheet for Patients: PinkCheek.be Fact Sheet for Healthcare Providers: GravelBags.it This test is not yet approved or cleared by the [place] FDA and  has been authorized for detection and/or diagnosis of SARS-CoV-2 by  FDA under an Emergency Use Authorization (EUA). This EUA will remain  in effect (meaning this test can be used) for the duration of the  Covid-19 declaration under Section 564(b)(1) of the Act, 21  U.S.C. section 360bbb-3(b)(1), unless the authorization is  terminated or revoked. Performed at [facility], [place] [address], [place],  [address]  Radiology Studies: DG Chest Port 1 View  Result Date: 04/18/2019 CLINICAL DATA:  Weakness, vomiting EXAM: PORTABLE CHEST 1 VIEW COMPARISON:  03/19/2018 FINDINGS: Mild patchy opacity in the right mid lung and bilateral lower lobes, suspicious for multifocal pneumonia. No pleural effusion or pneumothorax. The heart is normal in size. Right chest power port terminates at the cavoatrial junction. IMPRESSION: Multifocal patchy opacities, suspicious for pneumonia. Electronically Signed   By: Julian Hy M.D.   On: 04/18/2019 22:04    Scheduled Meds: . acyclovir  400 mg Oral Daily  . allopurinol  100 mg Oral BID  . aspirin EC  81 mg Oral Daily  . atorvastatin  20 mg Oral QHS  . brimonidine  1 drop Right Eye TID  . calcium citrate  200 mg of elemental calcium Oral Daily  . Chlorhexidine Gluconate Cloth  6 each Topical Daily  . dorzolamide-timolol  1 drop Right Eye BID  . enoxaparin (LOVENOX) injection  40 mg Subcutaneous Daily  . fluorometholone  1 drop Right Eye Daily  .  latanoprost  1 drop Right Eye QHS  . levothyroxine  75 mcg Oral QAC breakfast  . pantoprazole  40 mg Oral Daily  . potassium chloride  30 mEq Oral BID  . sodium chloride flush  10-40 mL Intracatheter Q12H   Continuous Infusions: . sodium chloride 125 mL/hr at 04/20/19 0641  . calcium gluconate 2,000 mg (04/20/19 0649)  . cefTRIAXone (ROCEPHIN)  IV Stopped (04/20/19 0544)  . potassium chloride 10 mEq (04/20/19 9166)     LOS: 1 day   Time spent: 47mn  Kien Mirsky C Ferron Ishmael, DO [facility]  If 7PM-7AM, please contact night-coverage [email or web address]  04/20/2019, 7:17 AM

## 2019-04-20 NOTE — Progress Notes (Signed)
Calvin Mccormick   DOB:1945-07-15   EE#:[id number]    ASSESSMENT & PLAN:  Multiple myeloma His recent myeloma panel showed positive response to treatment but due to his frail status and multiple complications, I will put his treatment on hold Continue supportive care  Acquired pancytopenia Due to treatment side effects He is frail with multiple co-morbidities; he has chronic renal failure as well as multiple myeloma and with recent chemotherapy, without blood transfusion, his blood count will not improve We discussed some of the risks, benefits, and alternatives of blood transfusions. The patient is symptomatic from anemia and the hemoglobin level is critically low.  Some of the side-effects to be expected including risks of transfusion reactions, chills, infection, syndrome of volume overload and risk of hospitalization from various reasons and the patient is willing to proceed and went ahead to sign consent today. I spoke with the patient and his wife and they are in agreement to receive 1 unit of blood transfusion Due to prior exposure to daratumumab, blood typing is difficult.  I will work with blood bank to give him the best matched blood possible  Chronic kidney disease stage III Continue IV support  Streptococcal pneumonia Urinary streptococcal pneumonia antigen is positive Continue antibiotics  Multiple electrolyte imbalances Replace as needed  Moderate to severe protein calorie malnutrition Recommend nutritional supplement as tolerated  Code Status Full code  Goals of care & Discharge planning The patient had received 1 course of antibiotics 2 weeks ago for severe skin infection I recommend he completes the whole course of IV antibiotics in the hospital if possible Agree with physical therapy/Occupational Therapy to assess his strength He may or may not be able to go home and may need skilled nursing facility depending on assessment I will return on Thursday to check on  him  All questions were answered. The patient knows to call the clinic with any problems, questions or concerns.   Heath Lark, MD 04/20/2019 8:23 AM  Subjective:  He felt better.  He has been afebrile.  I also spoke with his wife over the telephone.  He felt that he is eating okay.  Denies recent diarrhea.  His wife is concerned about him going back home because he looks so weak The patient denies any recent signs or symptoms of bleeding such as spontaneous epistaxis, hematuria or hematochezia.   Objective:  Vitals:   04/19/19 2138 04/20/19 0501  BP: 132/73 140/68  Pulse: 75 73  Resp:    Temp: 98.4 F (36.9 C) 98.7 F (37.1 C)  SpO2: 96% 96%     Intake/Output Summary (Last 24 hours) at 04/20/2019 0823 Last data filed at 04/20/2019 0750 Gross per 24 hour  Intake 3226.71 ml  Output 2020 ml  Net 1206.71 ml    GENERAL:alert, no distress and comfortable NEURO: alert & oriented x 3 with fluent speech, no focal motor/sensory deficits   Labs:  Recent Labs    04/13/19 1002 04/13/19 1002 04/18/19 2004 04/19/19 0500 04/20/19 0359  NA 131*   < > 133* 133* 132*  K 5.2*   < > 4.2 3.9 2.7*  CL 106   < > 103 102 105  CO2 17*   < > 19* 19* 20*  GLUCOSE 121*   < > 96 91 85  BUN 14   < > '20 19 13  ' CREATININE 2.21*   < > 1.56* 1.42* 1.25*  CALCIUM 7.7*   < > 7.4* 6.7* 6.4*  GFRNONAA 28*   < >  43* 48* 56*  GFRAA 32*   < > 49* 55* >60  PROT 7.1  --  6.7 5.3*  --   ALBUMIN 3.0*  --  3.3* 2.5*  --   AST 12*  --  23 14*  --   ALT 11  --  40 27  --   ALKPHOS 64  --  50 34*  --   BILITOT 0.6  --  0.8 0.7  --    < > = values in this interval not displayed.    Studies:  DG Chest Port 1 View  Result Date: 04/18/2019 CLINICAL DATA:  Weakness, vomiting EXAM: PORTABLE CHEST 1 VIEW COMPARISON:  03/19/2018 FINDINGS: Mild patchy opacity in the right mid lung and bilateral lower lobes, suspicious for multifocal pneumonia. No pleural effusion or pneumothorax. The heart is normal in size.  Right chest power port terminates at the cavoatrial junction. IMPRESSION: Multifocal patchy opacities, suspicious for pneumonia. Electronically Signed   By: Julian Hy M.D.   On: 04/18/2019 22:04

## 2019-04-20 NOTE — Progress Notes (Signed)
Report from Maudie Mercury, Therapist, sports. Care assumed for pt at this time. Assessment unchanged from AM assessment. Pt resting in bed, no c/o. IS taught to pt and he is using to 500 successfully.

## 2019-04-20 NOTE — Progress Notes (Signed)
Pt oral temp 100.2. Confirmed with Dr Lorenso Courier okay to continue with PRBC transfusion with Tylenol and benadryl as pre-med.

## 2019-04-20 NOTE — Progress Notes (Signed)
Made provider M. Sharlet Salina aware of critical lab for pt.  Potassium- 2.7 Calcium- 6.4

## 2019-04-21 DIAGNOSIS — A419 Sepsis, unspecified organism: Secondary | ICD-10-CM

## 2019-04-21 DIAGNOSIS — R6520 Severe sepsis without septic shock: Secondary | ICD-10-CM

## 2019-04-21 DIAGNOSIS — J188 Other pneumonia, unspecified organism: Secondary | ICD-10-CM

## 2019-04-21 DIAGNOSIS — J189 Pneumonia, unspecified organism: Secondary | ICD-10-CM

## 2019-04-21 DIAGNOSIS — E039 Hypothyroidism, unspecified: Secondary | ICD-10-CM

## 2019-04-21 DIAGNOSIS — E876 Hypokalemia: Secondary | ICD-10-CM

## 2019-04-21 LAB — BASIC METABOLIC PANEL
Anion gap: 7 (ref 5–15)
BUN: 7 mg/dL — ABNORMAL LOW (ref 8–23)
CO2: 17 mmol/L — ABNORMAL LOW (ref 22–32)
Calcium: 7.2 mg/dL — ABNORMAL LOW (ref 8.9–10.3)
Chloride: 108 mmol/L (ref 98–111)
Creatinine, Ser: 1.27 mg/dL — ABNORMAL HIGH (ref 0.61–1.24)
GFR calc Af Amer: >60 mL/min (ref >60)
GFR calc non Af Amer: 55 mL/min — ABNORMAL LOW (ref >60)
Glucose, Bld: 77 mg/dL (ref 70–99)
Potassium: 3.7 mmol/L (ref 3.5–5.1)
Sodium: 132 mmol/L — ABNORMAL LOW (ref 135–145)

## 2019-04-21 LAB — BPAM RBC
Blood Product Expiration Date: 202105112359
ISSUE DATE / TIME: 202104131814
Unit Type and Rh: 5100

## 2019-04-21 LAB — HEPATIC FUNCTION PANEL
ALT: 30 U/L (ref 0–44)
AST: 17 U/L (ref 15–41)
Albumin: 2.4 g/dL — ABNORMAL LOW (ref 3.5–5.0)
Alkaline Phosphatase: 49 U/L (ref 38–126)
Bilirubin, Direct: 0.1 mg/dL (ref 0.0–0.2)
Indirect Bilirubin: 0.2 mg/dL — ABNORMAL LOW (ref 0.3–0.9)
Total Bilirubin: 0.3 mg/dL (ref 0.3–1.2)
Total Protein: 5.5 g/dL — ABNORMAL LOW (ref 6.5–8.1)

## 2019-04-21 LAB — TYPE AND SCREEN
ABO/RH(D): O POS
Antibody Screen: POSITIVE
DAT, IgG: NEGATIVE
Donor AG Type: NEGATIVE
Unit division: 0

## 2019-04-21 LAB — CBC
HCT: 27.6 % — ABNORMAL LOW (ref 39.0–52.0)
Hemoglobin: 8.9 g/dL — ABNORMAL LOW (ref 13.0–17.0)
MCH: 28.7 pg (ref 26.0–34.0)
MCHC: 32.2 g/dL (ref 30.0–36.0)
MCV: 89 fL (ref 80.0–100.0)
Platelets: 105 10*3/uL — ABNORMAL LOW (ref 150–400)
RBC: 3.1 MIL/uL — ABNORMAL LOW (ref 4.22–5.81)
RDW: 22.9 % — ABNORMAL HIGH (ref 11.5–15.5)
WBC: 9.3 10*3/uL (ref 4.0–10.5)
nRBC: 0.2 % (ref 0.0–0.2)

## 2019-04-21 LAB — MAGNESIUM: Magnesium: 0.9 mg/dL — CL (ref 1.7–2.4)

## 2019-04-21 MED ORDER — MAGNESIUM SULFATE 4 GM/100ML IV SOLN
4.0000 g | Freq: Once | INTRAVENOUS | Status: AC
  Administered 2019-04-21: 4 g via INTRAVENOUS
  Filled 2019-04-21: qty 100

## 2019-04-21 MED ORDER — CALCIUM CARBONATE-VITAMIN D 500-200 MG-UNIT PO TABS
2.0000 | ORAL_TABLET | Freq: Three times a day (TID) | ORAL | Status: DC
  Administered 2019-04-21 – 2019-04-22 (×3): 2 via ORAL
  Filled 2019-04-21 (×3): qty 2

## 2019-04-21 NOTE — Progress Notes (Signed)
PROGRESS NOTE    Calvin Mccormick  ZOX:[id number] DOB: [birth year] DOA: 04/18/2019 PCP: Calvin Lei, MD   Chief Complaint  Patient presents with  . Emesis  . Weakness    Brief Narrative:  [name] a 74 y.o.malewith medical history significant of relapsing multiple myeloma, anemia of chronic renal disease, CKD, chronic diarrhea, hypokalemia. Presented withconfusion and vomiting possibly febrile -very poor historian, following morning reports he was having shortness of breath abdominal pain and possibly diarrhea.EMS in route received Tylenol Zofran and IV fluids patient himself unable to provide detailed history. Patient reports feeling very weak and has had a fall. Otherwise denies any abdominal pain.   Assessment & Plan:   Active Problems:   Multiple myeloma in relapse (HCC)   CKD (chronic kidney disease), stage III   Chronic renal insufficiency, stage III (moderate)   HTN (hypertension)   Anemia in chronic renal disease   Community acquired pneumonia   CAP (community acquired pneumonia)   Pressure injury of skin   Multifocal pneumonia   Sepsis with acute organ dysfunction without septic shock ([facility])   Hypothyroidism  #1 sepsis secondary to community-acquired pneumonia, POA Patient on presentation met criteria for sepsis with leukocytosis, fever, chest x-ray concerning for multifocal pneumonia, evidence of endorgan damage with elevated lactic acidosis.  COVID-19 PCR negative.  Influenza AMB negative.  Patient initially was on azithromycin and currently on IV Rocephin.  Continue empiric IV antibiotics, supportive care.  Follow.  2.  Multiple myeloma in relapse Being followed by oncology, Dr. Alvy Mccormick.  Chemotherapy currently on hold due to patient's poor tolerance and frail status at this time in the setting of acute infection.  Outpatient follow-up with oncology.  3.  Hypokalemia/hypocalcemia/hypomagnesemia Unclear as to whether related to chemotherapy versus poor oral  intake.  Patient received IV calcium gluconate.  Corrected calcium of 8.48.  Check a phosphorus, vitamin D 25 hydroxy level, PTH.  Place on Os-Cal with vitamin D.  Magnesium 4 g IV x1.  Repeat magnesium level in the morning.  Keep magnesium greater than 2.  Potassium currently at 3.7.  4.  Acute symptomatic anemia/anemia of chronic disease/multiple myeloma/status post chemotherapy Patient noted to have a downtrending hemoglobin.  Oncology recommended transfusion.  Patient noted to have difficulty crossmatching given previous exposure to daratumumab.  Status post transfusion 1 unit packed red blood cells 04/20/2019.  Hemoglobin currently at 8.9.  Follow.  5.  Chronic kidney disease stage IIIa Stable.  6.  Hypertension Currently stable.  7.  Hypothyroidism Continue home dose Synthroid.  8.  Stage II pressure injury mid vertebrae,/coccyx POA Continue current wound care.    DVT prophylaxis: Lovenox Code Status: Full Family Communication: Updated patient.  No family at bedside. Disposition:   Status is: Inpatient    Dispo: The patient is from: Home              Anticipated d/c is to: To be determined.  Likely back home with his family.              Anticipated d/c date is: 04/23/2019, 04/24/2019              Patient currently likely home when clinically improved, completion of antibiotics.        Consultants:   Hematology/oncology Calvin Mccormick  Procedures:   Chest x-ray 04/18/2019  Transfused 1 unit packed red blood cells 04/20/2019  Antimicrobials:  IV azithromycin for twelve 2021 x 1 dose  IV Rocephin 04/19/2019>>>>   Subjective: Patient sleeping  but arousable.  Denies any chest pain.  States shortness of breath improving.  No abdominal pain.  Feeling better than on admission.  Objective: Vitals:   04/21/19 0100 04/21/19 0400 04/21/19 0547 04/21/19 1242  BP:   (!) 156/78 137/81  Pulse:   63 66  Resp: '14 15 16 16  ' Temp:   98.9 F (37.2 C) 97.9 F (36.6 C)    TempSrc:   Oral Oral  SpO2:   100% 97%  Weight:      Height:        Intake/Output Summary (Last 24 hours) at 04/21/2019 2102 Last data filed at 04/21/2019 1700 Gross per 24 hour  Intake 2730.01 ml  Output 3525 ml  Net -794.99 ml   Filed Weights   04/18/19 2016 04/18/19 2236 04/19/19 0430  Weight: 65 kg 65.8 kg 67.1 kg    Examination:  General exam: Appears calm and comfortable  Respiratory system: some coarse BS.  No wheezing.  Normal respiratory effort.  Speaking in full sentences.   Cardiovascular system: S1 & S2 heard, RRR. No JVD, murmurs, rubs, gallops or clicks. No pedal edema. Gastrointestinal system: Abdomen is nondistended, soft and nontender. No organomegaly or masses felt. Normal bowel sounds heard. Central nervous system: Alert and oriented. No focal neurological deficits. Extremities: Symmetric 5 x 5 power. Skin: No rashes, lesions or ulcers Psychiatry: Judgement and insight appear normal. Mood & affect appropriate.     Data Reviewed: I have personally reviewed following labs and imaging studies  CBC: Recent Labs  Lab 04/18/19 2004 04/19/19 0500 04/20/19 0359 04/21/19 0300  WBC 15.0* 15.8* 13.0* 9.3  NEUTROABS 12.9*  --   --   --   HGB 9.6* 8.0* 7.2* 8.9*  HCT 29.3* 24.5* 21.9* 27.6*  MCV 86.4 85.7 86.6 89.0  PLT 167 120* 113* 105*    Basic Metabolic Panel: Recent Labs  Lab 04/18/19 2004 04/19/19 0500 04/20/19 0359 04/21/19 0300 04/21/19 0941  NA 133* 133* 132* 132*  --   K 4.2 3.9 2.7* 3.7  --   CL 103 102 105 108  --   CO2 19* 19* 20* 17*  --   GLUCOSE 96 91 85 77  --   BUN '20 19 13 ' 7*  --   CREATININE 1.56* 1.42* 1.25* 1.27*  --   CALCIUM 7.4* 6.7* 6.4* 7.2*  --   MG  --  1.0*  --   --  0.9*  PHOS  --  3.8  --   --   --     GFR: Estimated Creatinine Clearance: 47 mL/min (A) (by C-G formula based on SCr of 1.27 mg/dL (H)).  Liver Function Tests: Recent Labs  Lab 04/18/19 2004 04/19/19 0500 04/21/19 0941  AST 23 14* 17  ALT  40 27 30  ALKPHOS 50 34* 49  BILITOT 0.8 0.7 0.3  PROT 6.7 5.3* 5.5*  ALBUMIN 3.3* 2.5* 2.4*    CBG: No results for input(s): GLUCAP in the last 168 hours.   Recent Results (from the past 240 hour(s))  Urine culture     Status: Abnormal   Collection Time: 04/18/19  8:04 PM   Specimen: In/Out Cath Urine  Result Value Ref Range Status   Specimen Description   Final    IN/OUT CATH URINE Performed at [facility] [address], [place], [place] [address]    Special Requests   Final    NONE Performed at [facility], [address] [place],  [place] [address]    Culture MULTIPLE SPECIES PRESENT, SUGGEST RECOLLECTION (A)  Final   Report Status 04/20/2019 FINAL  Final  Blood Cultures (routine x 2)     Status: None (Preliminary result)   Collection Time: 04/18/19  8:28 PM   Specimen: BLOOD  Result Value Ref Range Status   Specimen Description BLOOD LEFT ANTECUBITAL  Final   Special Requests   Final    BOTTLES DRAWN AEROBIC AND ANAEROBIC Blood Culture results may not be optimal due to an excessive volume of blood received in culture bottles Performed at [facility], [place] [address], [place], [place] [address]    Culture NO GROWTH 2 DAYS  Final   Report Status PENDING  Incomplete  Blood Cultures (routine x 2)     Status: None (Preliminary result)   Collection Time: 04/18/19  8:33 PM   Specimen: BLOOD  Result Value Ref Range Status   Specimen Description BLOOD PORTA CATH  Final   Special Requests   Final    BOTTLES DRAWN AEROBIC AND ANAEROBIC Blood Culture adequate volume Performed at [facility] [address], [place], [place] [address]    Culture NO GROWTH 2 DAYS  Final   Report Status PENDING  Incomplete  Respiratory Panel by RT PCR (Flu A&B, Covid) - Nasopharyngeal Swab     Status: None   Collection Time: 04/19/19  2:13 AM   Specimen: Nasopharyngeal Swab  Result Value Ref Range Status   SARS  Coronavirus 2 by RT PCR NEGATIVE NEGATIVE Final    Comment: (NOTE) SARS-CoV-2 target nucleic acids are NOT DETECTED. The SARS-CoV-2 RNA is generally detectable in upper respiratoy specimens during the acute phase of infection. The lowest concentration of SARS-CoV-2 viral copies this assay can detect is 131 copies/mL. A negative result does not preclude SARS-Cov-2 infection and should not be used as the sole basis for treatment or other patient management decisions. A negative result may occur with  improper specimen collection/handling, submission of specimen other than nasopharyngeal swab, presence of viral mutation(s) within the areas targeted by this assay, and inadequate number of viral copies (<131 copies/mL). A negative result must be combined with clinical observations, patient history, and epidemiological information. The expected result is Negative. Fact Sheet for Patients:  PinkCheek.be Fact Sheet for Healthcare Providers:  GravelBags.it This test is not yet ap proved or cleared by the [place] FDA and  has been authorized for detection and/or diagnosis of SARS-CoV-2 by FDA under an Emergency Use Authorization (EUA). This EUA will remain  in effect (meaning this test can be used) for the duration of the COVID-19 declaration under Section 564(b)(1) of the Act, 21 U.S.C. section 360bbb-3(b)(1), unless the authorization is terminated or revoked sooner.    Influenza A by PCR NEGATIVE NEGATIVE Final   Influenza B by PCR NEGATIVE NEGATIVE Final    Comment: (NOTE) The Xpert Xpress SARS-CoV-2/FLU/RSV assay is intended as an aid in  the diagnosis of influenza from Nasopharyngeal swab specimens and  should not be used as a sole basis for treatment. Nasal washings and  aspirates are unacceptable for Xpert Xpress SARS-CoV-2/FLU/RSV  testing. Fact Sheet for Patients: PinkCheek.be Fact Sheet  for Healthcare Providers: GravelBags.it This test is not yet approved or cleared by the [place] FDA and  has been authorized for detection and/or diagnosis of SARS-CoV-2 by  FDA under an Emergency Use Authorization (EUA). This EUA will remain  in effect (meaning this test can be used) for the duration  of the  Covid-19 declaration under Section 564(b)(1) of the Act, 21  U.S.C. section 360bbb-3(b)(1), unless the authorization is  terminated or revoked. Performed at [facility], [place] [address] [place], [place] [address]          Radiology Studies: No results found.      Scheduled Meds: . sodium chloride   Intravenous Once  . acyclovir  400 mg Oral Daily  . allopurinol  100 mg Oral BID  . aspirin EC  81 mg Oral Daily  . atorvastatin  20 mg Oral QHS  . brimonidine  1 drop Right Eye TID  . Chlorhexidine Gluconate Cloth  6 each Topical Daily  . dorzolamide-timolol  1 drop Right Eye BID  . enoxaparin (LOVENOX) injection  40 mg Subcutaneous Daily  . fluorometholone  1 drop Right Eye Daily  . latanoprost  1 drop Right Eye QHS  . levothyroxine  75 mcg Oral QAC breakfast  . pantoprazole  40 mg Oral Daily  . sodium chloride flush  10-40 mL Intracatheter Q12H   Continuous Infusions: . sodium chloride 125 mL/hr at 04/21/19 1947  . cefTRIAXone (ROCEPHIN)  IV 2 g (04/21/19 0550)     LOS: 2 days    Time spent: 35 minutes    Irine Seal, MD [facility]   To contact the attending provider between 7A-7P or the covering provider during after hours 7P-7A, please log into the web site [email or web address] and access using universal [place] password for that web site. If you do not have the password, please call the hospital operator.  04/21/2019, 9:02 PM

## 2019-04-22 DIAGNOSIS — J13 Pneumonia due to Streptococcus pneumoniae: Secondary | ICD-10-CM | POA: Diagnosis not present

## 2019-04-22 DIAGNOSIS — N183 Chronic kidney disease, stage 3 unspecified: Secondary | ICD-10-CM | POA: Diagnosis not present

## 2019-04-22 DIAGNOSIS — E878 Other disorders of electrolyte and fluid balance, not elsewhere classified: Secondary | ICD-10-CM | POA: Diagnosis not present

## 2019-04-22 DIAGNOSIS — L8990 Pressure ulcer of unspecified site, unspecified stage: Secondary | ICD-10-CM

## 2019-04-22 DIAGNOSIS — E440 Moderate protein-calorie malnutrition: Secondary | ICD-10-CM | POA: Diagnosis not present

## 2019-04-22 DIAGNOSIS — A409 Streptococcal sepsis, unspecified: Secondary | ICD-10-CM

## 2019-04-22 LAB — CBC WITH DIFFERENTIAL/PLATELET
Abs Immature Granulocytes: 0.07 10*3/uL (ref 0.00–0.07)
Basophils Absolute: 0 10*3/uL (ref 0.0–0.1)
Basophils Relative: 0 %
Eosinophils Absolute: 0 10*3/uL (ref 0.0–0.5)
Eosinophils Relative: 0 %
HCT: 26.5 % — ABNORMAL LOW (ref 39.0–52.0)
Hemoglobin: 8.9 g/dL — ABNORMAL LOW (ref 13.0–17.0)
Immature Granulocytes: 1 %
Lymphocytes Relative: 18 %
Lymphs Abs: 1 10*3/uL (ref 0.7–4.0)
MCH: 29.7 pg (ref 26.0–34.0)
MCHC: 33.6 g/dL (ref 30.0–36.0)
MCV: 88.3 fL (ref 80.0–100.0)
Monocytes Absolute: 0.3 10*3/uL (ref 0.1–1.0)
Monocytes Relative: 5 %
Neutro Abs: 4.3 10*3/uL (ref 1.7–7.7)
Neutrophils Relative %: 76 %
Platelets: 94 10*3/uL — ABNORMAL LOW (ref 150–400)
RBC: 3 MIL/uL — ABNORMAL LOW (ref 4.22–5.81)
RDW: 22.8 % — ABNORMAL HIGH (ref 11.5–15.5)
WBC: 5.7 10*3/uL (ref 4.0–10.5)
nRBC: 0 % (ref 0.0–0.2)

## 2019-04-22 LAB — COMPREHENSIVE METABOLIC PANEL
ALT: 27 U/L (ref 0–44)
AST: 16 U/L (ref 15–41)
Albumin: 2.4 g/dL — ABNORMAL LOW (ref 3.5–5.0)
Alkaline Phosphatase: 53 U/L (ref 38–126)
Anion gap: 8 (ref 5–15)
BUN: 8 mg/dL (ref 8–23)
CO2: 19 mmol/L — ABNORMAL LOW (ref 22–32)
Calcium: 7.7 mg/dL — ABNORMAL LOW (ref 8.9–10.3)
Chloride: 104 mmol/L (ref 98–111)
Creatinine, Ser: 1.26 mg/dL — ABNORMAL HIGH (ref 0.61–1.24)
GFR calc Af Amer: >60 mL/min (ref >60)
GFR calc non Af Amer: 55 mL/min — ABNORMAL LOW (ref >60)
Glucose, Bld: 95 mg/dL (ref 70–99)
Potassium: 3.5 mmol/L (ref 3.5–5.1)
Sodium: 131 mmol/L — ABNORMAL LOW (ref 135–145)
Total Bilirubin: 0.3 mg/dL (ref 0.3–1.2)
Total Protein: 5.5 g/dL — ABNORMAL LOW (ref 6.5–8.1)

## 2019-04-22 LAB — MAGNESIUM: Magnesium: 1.5 mg/dL — ABNORMAL LOW (ref 1.7–2.4)

## 2019-04-22 LAB — PHOSPHORUS: Phosphorus: 2.5 mg/dL (ref 2.5–4.6)

## 2019-04-22 LAB — VITAMIN D 25 HYDROXY (VIT D DEFICIENCY, FRACTURES): Vit D, 25-Hydroxy: 35.15 ng/mL (ref 30–100)

## 2019-04-22 MED ORDER — CALCIUM CARBONATE-VITAMIN D 500-200 MG-UNIT PO TABS: 2.0000 | ORAL_TABLET | Freq: Three times a day (TID) | ORAL | Status: AC

## 2019-04-22 MED ORDER — CEFDINIR 300 MG PO CAPS: 300.0000 mg | ORAL_CAPSULE | Freq: Two times a day (BID) | ORAL | Status: DC

## 2019-04-22 MED ORDER — HEPARIN SOD (PORK) LOCK FLUSH 100 UNIT/ML IV SOLN
500.0000 [IU] | INTRAVENOUS | Status: AC | PRN
  Administered 2019-04-22: 500 [IU]
  Filled 2019-04-22: qty 5

## 2019-04-22 MED ORDER — CEFDINIR 300 MG PO CAPS: 300.0000 mg | ORAL_CAPSULE | Freq: Two times a day (BID) | ORAL | 0 refills | Status: AC

## 2019-04-22 MED ORDER — MAGNESIUM SULFATE 4 GM/100ML IV SOLN
4.0000 g | Freq: Once | INTRAVENOUS | Status: AC
  Administered 2019-04-22: 4 g via INTRAVENOUS
  Filled 2019-04-22: qty 100

## 2019-04-22 MED ORDER — POTASSIUM CHLORIDE CRYS ER 20 MEQ PO TBCR
40.0000 meq | EXTENDED_RELEASE_TABLET | Freq: Once | ORAL | Status: AC
  Administered 2019-04-22: 40 meq via ORAL
  Filled 2019-04-22: qty 2

## 2019-04-22 NOTE — Evaluation (Signed)
Physical Therapy Evaluation Patient Details Name: Calvin Mccormick MRN: [id number] DOB: 06/22/1945 Today's Date: 04/22/2019   History of Present Illness  Calvin Mccormick is a 74 y.o. male with medical history significant of relapsing multiple myeloma, anemia of chronic renal disease, CKD, chronic diarrhea, hypokalemia. Presented with confusion and vomiting possibly febrile   Clinical Impression  Pt admitted with above diagnosis. Pt requires mod assist with OOB mobility. Pt demonstrates kyphotic posture with RW providing steadying assistance, able to ambulate limited distances limited by BLE pain and fatigue. Significant education on benefit of acute care PT/OT and HH therapy to improve strength and reduce risk for falls and pt considering recommendation by end of conversation. Pt currently with functional limitations due to the deficits listed below (see PT Problem List). Pt will benefit from skilled PT to increase their independence and safety with mobility to allow discharge to the venue listed below.       Follow Up Recommendations Home health PT;Supervision for mobility/OOB    Equipment Recommendations  Rolling walker with 5" wheels;3in1 (PT)    Recommendations for Other Services       Precautions / Restrictions Precautions Precautions: Fall Restrictions Weight Bearing Restrictions: No      Mobility  Bed Mobility Overal bed mobility: Needs Assistance Bed Mobility: Supine to Sit     Supine to sit: Supervision     General bed mobility comments: verbal cues for hand placement and sequencing to come to EOB, min assist to scoot to EOB to place feet flat on floor  Transfers Overall transfer level: Needs assistance Equipment used: Rolling walker (2 wheeled) Transfers: Sit to/from Stand Sit to Stand: Mod assist         General transfer comment: mod assist to power up, slow to extend bil knees and hips, maintains flexed trunk posture with dependence on RW for  steadying  Ambulation/Gait Ambulation/Gait assistance: Mod assist;+2 safety/equipment Gait Distance (Feet): 8 Feet(WC follow) Assistive device: Rolling walker (2 wheeled) Gait Pattern/deviations: Step-to pattern;Decreased stride length;Shuffle Gait velocity: decreased   General Gait Details: slow, shuffling steps from EOB to doorway, assistance to maintain body within RW frame, poor bil foot clearance, maintains flexed trunk and knees throughout gait cycle, mild SOB, WC follow due to fatigue  Stairs            Wheelchair Mobility    Modified Rankin (Stroke Patients Only)       Balance Overall balance assessment: Needs assistance;History of Falls Sitting-balance support: Feet supported;No upper extremity supported Sitting balance-Leahy Scale: Fair Sitting balance - Comments: seated EOB   Standing balance support: During functional activity;Bilateral upper extremity supported Standing balance-Leahy Scale: Poor Standing balance comment: dependent on RW for steadying              Pertinent Vitals/Pain Pain Assessment: 0-10 Pain Score: 8  Pain Location: BLE Pain Descriptors / Indicators: Aching;Sore Pain Intervention(s): Limited activity within patient's tolerance;Monitored during session;RN gave pain meds during session;Repositioned    Home Living Family/patient expects to be discharged to:: Private residence Living Arrangements: Spouse/significant other Available Help at Discharge: Family Type of Home: House Home Access: Ramped entrance     Home Layout: One level Home Equipment: Grab bars - tub/shower;Walker - 2 wheels;Cane - single point      Prior Function Level of Independence: Independent with assistive device(s)         Comments: needs a new shower chair goes back and forth with SPC and RW     Hand Dominance  Dominant Hand: Right    Extremity/Trunk Assessment   Upper Extremity Assessment Upper Extremity Assessment: Defer to OT evaluation     Lower Extremity Assessment Lower Extremity Assessment: Generalized weakness(BLE AROM WNL, grossly 3/5)    Cervical / Trunk Assessment Cervical / Trunk Assessment: Kyphotic  Communication   Communication: No difficulties  Cognition Arousal/Alertness: Awake/alert Behavior During Therapy: WFL for tasks assessed/performed Overall Cognitive Status: Within Functional Limits for tasks assessed          General Comments: occasionally slow to respond, but appropriate      General Comments General comments (skin integrity, edema, etc.): Pt reports falling on Sunday but didn't hit head head; on RA and SpO2 99-100% with mobility    Exercises     Assessment/Plan    PT Assessment Patient needs continued PT services  PT Problem List Decreased strength;Decreased range of motion;Decreased activity tolerance;Decreased balance;Decreased mobility;Decreased knowledge of use of DME;Pain       PT Treatment Interventions DME instruction;Gait training;Functional mobility training;Therapeutic activities;Therapeutic exercise;Balance training;Neuromuscular re-education;Patient/family education;Manual techniques    PT Goals (Current goals can be found in the Care Plan section)  Acute Rehab PT Goals Patient Stated Goal: go back home PT Goal Formulation: With patient Time For Goal Achievement: 04/29/19 Potential to Achieve Goals: Good    Frequency Min 3X/week   Barriers to discharge        Co-evaluation PT/OT/SLP Co-Evaluation/Treatment: Yes Reason for Co-Treatment: For patient/therapist safety;To address functional/ADL transfers PT goals addressed during session: Mobility/safety with mobility;Balance;Proper use of DME         AM-PAC PT "6 Clicks" Mobility  Outcome Measure Help needed turning from your back to your side while in a flat bed without using bedrails?: None Help needed moving from lying on your back to sitting on the side of a flat bed without using bedrails?: None Help  needed moving to and from a bed to a chair (including a wheelchair)?: A Lot Help needed standing up from a chair using your arms (e.g., wheelchair or bedside chair)?: A Lot Help needed to walk in hospital room?: A Lot Help needed climbing 3-5 steps with a railing? : Total 6 Click Score: 15    End of Session Equipment Utilized During Treatment: Gait belt Activity Tolerance: Patient tolerated treatment well;Patient limited by fatigue Patient left: in chair;with call bell/phone within reach;with chair alarm set Nurse Communication: Mobility status PT Visit Diagnosis: Unsteadiness on feet (R26.81);Other abnormalities of gait and mobility (R26.89);Muscle weakness (generalized) (M62.81);History of falling (Z91.81)    Time: 6286-3817 PT Time Calculation (min) (ACUTE ONLY): 35 min   Charges:   PT Evaluation $PT Eval Moderate Complexity: 1 Mod PT Treatments $Self Care/Home Management: 8-22         Tori Kassim Guertin PT, DPT 04/22/19, 11:03 AM [phone]

## 2019-04-22 NOTE — Care Management Important Message (Signed)
Important Message  Patient Details IM Letter given to [name] SW Case Manager to present to the Patient Name: Calvin Mccormick MRN: [id number] Date of Birth: 27-Aug-1945   Medicare Important Message Given:  Yes     Kerin Salen 04/22/2019, 11:29 AM

## 2019-04-22 NOTE — Discharge Summary (Signed)
Physician Discharge Summary  Calvin Mccormick GYI:[id number] DOB: 07-Jul-1945 DOA: 04/18/2019  PCP: Calvin Lei, MD  Admit date: 04/18/2019 Discharge date: 04/22/2019  Time spent: 60 minutes  Recommendations for Outpatient Follow-up:  1. Follow-up with Dr. Alvy Bimler,  hematology/oncology as scheduled on Tuesday, 04/27/2019.  On follow-up patient need a magnesium level, basic metabolic profile, CBC. 2. Follow-up with Calvin Lei, MD in 2 to 3 weeks.  On follow-up patient will need a magnesium level and a basic metabolic profile done to follow-up on electrolytes and renal function.  Patient will need a repeat thyroid function studies done in about 4 to 6 weeks.  Patient's pneumonia need to be reassessed on follow-up.   Discharge Diagnoses:  Active Problems:   Multiple myeloma in relapse (HCC)   CKD (chronic kidney disease), stage III   Chronic renal insufficiency, stage III (moderate)   HTN (hypertension)   Anemia in chronic renal disease   Community acquired pneumonia   CAP (community acquired pneumonia)   Pressure injury of skin   Multifocal pneumonia   Sepsis with acute organ dysfunction without septic shock (Calvin Mccormick)   Hypothyroidism   Discharge Condition: Stable and improved  Diet recommendation: Heart healthy  Filed Weights   04/18/19 2016 04/18/19 2236 04/19/19 0430  Weight: 65 kg 65.8 kg 67.1 kg    History of present illness:  HPI per Dr. Ludwig Clarks is a 74 y.o. male with medical history significant of relapsing multiple myeloma, anemia of chronic renal disease, CKD, chronic diarrhea, hypokalemia    Presented with confusion and vomiting possibly febrile.  EMS en route received Tylenol Zofran and IV fluids patient himself unable to provide detailed history. Patient reported feeling very weak and has had a fall.  Otherwise denied any abdominal pain   Infectious risk factors:  Reports fever  N/V/Diarrhea/abdominal pain,      Has  NOt been vaccinated against COVID    In  ER RAPID COVID TEST  NEGATIVE   in house  PCR testing  Pending  Recent Labs  No results found for: SARSCOV2NAA     Regarding pertinent Chronic problems:   Multiple myeloma currently treated po chemotherapy [name] And recurrent blood transfusions   Hyperlipidemia - on statins Lipitor   HTN on Norvasc   CKD stage III - baseline Cr 2.0 Recent Labs       Lab Results  Component Value Date   CREATININE 1.56 (H) 04/18/2019   CREATININE 2.21 (H) 04/13/2019   CREATININE 1.80 (H) 04/06/2019       While in ER: Noted to have leukocytosis hemoglobin 9.6 Febrile up to 102 Chest x-ray showing multifocal patchy opacities suspicious for pneumonia    Hospitalist was called for admission for sepsis and multifocal pNA  Hospital Course:  1 sepsis secondary to community-acquired pneumonia, POA Patient on presentation met criteria for sepsis with leukocytosis, fever, chest x-ray concerning for multifocal pneumonia, evidence of endorgan damage with elevated lactic acidosis.  COVID-19 PCR negative.  Influenza AMB negative.  Patient initially was on azithromycin and on IV Rocephin.    Urine strep pneumococcus antigen was positive.  Azithromycin subsequently discontinued.  Patient maintained on IV Rocephin.  Patient be discharged home on 3 more days of oral Vantin/Omnicef to complete a 1 week course of antibiotic treatment.  Patient improved clinically.  Patient remained afebrile.  Outpatient follow-up with PCP.   2.  Multiple myeloma in relapse Being followed by oncology, Dr. Alvy Bimler.  Chemotherapy currently on hold due to patient's poor  tolerance and frail status at this time in the setting of acute infection.  Patient was followed by oncology throughout the hospitalization. Outpatient follow-up with oncology as scheduled..  3.  Hypokalemia/hypocalcemia/hypomagnesemia Unclear as to whether related to chemotherapy versus poor oral intake.  Patient received IV calcium  gluconate.  Corrected calcium of 8.48(04/21/2019).    Phosphorus level noted to be 2.5.  Vitamin D 25-hydroxy was 35.15.  Intact PTH was pending at time of discharge.  Patient was noted to have a significantly low magnesium level of 0.9 and magnesium was repleted.  Potassium was also repleted.  Patient started on Os-Cal with vitamin D 3 times daily.  Outpatient follow-up.  4.  Acute symptomatic anemia/anemia of chronic disease/multiple myeloma/status post chemotherapy/acquired pancytopenia Patient noted to have a downtrending hemoglobin.  Oncology recommended transfusion.  Patient noted to have difficulty crossmatching given previous exposure to daratumumab.  Status post transfusion 1 unit packed red blood cells 04/20/2019.  Hemoglobin stabilized at 8.9 by day of discharge.  Outpatient follow-up.  5.  Chronic kidney disease stage IIIa Remains stable.  6.  Hypertension Remained stable.   7.  Hypothyroidism Patient maintained on home regimen Synthroid.  8.  Stage II pressure injury mid vertebrae,/coccyx POA Patient was seen by wound care who had made recommendations for wound care.  Patient be discharged home with home health RN.  9.  Moderate to severe protein calorie malnutrition Patient maintained on nutritional supplementation.    Procedures:  Chest x-ray 04/18/2019  Transfused 1 unit packed red blood cells 04/20/2019   Consultations:  Hematology/oncology Dr. Alvy Bimler  Discharge Exam: Vitals:   04/22/19 0512 04/22/19 1230  BP: (!) 154/82 126/77  Pulse: 71 78  Resp: 17 16  Temp: 98.1 F (36.7 C) 99.1 F (37.3 C)  SpO2: 98% 99%    General: NAD Cardiovascular: Regular rate rhythm no murmurs rubs or gallops.  No JVD.  No lower extremity edema. Respiratory: Clear to auscultation bilaterally.  Fair air movement.  No wheezing.  Speaking in full sentences.  Normal respiratory effort.  Discharge Instructions   Discharge Instructions    Diet - low sodium heart healthy    Complete by: As directed    Increase activity slowly   Complete by: As directed      Allergies as of 04/22/2019   No Known Allergies     Medication List    STOP taking these medications   cholecalciferol 1000 units tablet Commonly known as: VITAMIN D   selinexor 20 MG Tbpk     TAKE these medications   acyclovir 400 MG tablet Commonly known as: ZOVIRAX Take 1 tablet (400 mg total) by mouth daily.   allopurinol 100 MG tablet Commonly known as: ZYLOPRIM Take 100 mg by mouth 2 (two) times daily.   amLODipine 10 MG tablet Commonly known as: NORVASC Take 10 mg by mouth daily.   aspirin EC 81 MG tablet Take 81 mg by mouth daily.   atorvastatin 20 MG tablet Commonly known as: LIPITOR Take 20 mg by mouth at bedtime.   brimonidine 0.2 % ophthalmic solution Commonly known as: ALPHAGAN Place 1 drop into the right eye 3 (three) times daily.   calcium carbonate 500 MG chewable tablet Commonly known as: TUMS - dosed in mg elemental calcium Chew 1 tablet by mouth daily as needed for indigestion or heartburn.   calcium-vitamin D 500-200 MG-UNIT tablet Commonly known as: OSCAL WITH D Take 2 tablets by mouth 3 (three) times daily.   cefdinir 300 MG  capsule Commonly known as: OMNICEF Take 1 capsule (300 mg total) by mouth every 12 (twelve) hours for 3 days. Start taking on: April 23, 2019   dexamethasone 4 MG tablet Commonly known as: DECADRON Take 5 tabs on days 1 and 3 every week, take with food, preferably in the morning   dorzolamide-timolol 22.3-6.8 MG/ML ophthalmic solution Commonly known as: COSOPT Place 1 drop into the right eye 2 (two) times daily.   Euthyrox 75 MCG tablet Generic drug: levothyroxine Take 75 mcg by mouth daily before breakfast.   fluorometholone 0.1 % ophthalmic suspension Commonly known as: FML Place 1 drop into the right eye daily.   latanoprost 0.005 % ophthalmic solution Commonly known as: XALATAN Place 1 drop into the right eye at  bedtime.   lidocaine-prilocaine cream Commonly known as: EMLA Apply to affected area once   ondansetron 8 MG tablet Commonly known as: Zofran Take 1 tablet (8 mg total) by mouth every 8 (eight) hours as needed (Nausea or vomiting).   pantoprazole 40 MG tablet Commonly known as: PROTONIX TAKE 1 TABLET BY MOUTH EVERY DAY   prochlorperazine 10 MG tablet Commonly known as: COMPAZINE Take 1 tablet (10 mg total) by mouth every 6 (six) hours as needed for nausea or vomiting.            Durable Medical Equipment  (From admission, onward)         Start     Ordered   04/22/19 1212  For home use only DME 4 wheeled rolling walker with seat  Once    Question:  Patient needs a walker to treat with the following condition  Answer:  Debility   04/22/19 1211   04/22/19 1211  For home use only DME 3 n 1  Once     04/22/19 1210   04/22/19 1211  For home use only DME Tub bench  Once     04/22/19 1210         No Known Allergies Follow-up Information    Calvin Lei, MD. Schedule an appointment as soon as possible for a visit in 2 week(s).   Specialty: Family Medicine Why: f/u in 2-3 weeks. Contact information: [address]  [place] [address] [phone]        Heath Lark, MD Follow up on 04/27/2019.   Specialty: Hematology and Oncology Why: Follow-up as scheduled at 12 noon. Contact information: [place] [address] [phone]            The results of significant diagnostics from this hospitalization (including imaging, microbiology, ancillary and laboratory) are listed below for reference.    Significant Diagnostic Studies: DG Chest Port 1 View  Result Date: 04/18/2019 CLINICAL DATA:  Weakness, vomiting EXAM: PORTABLE CHEST 1 VIEW COMPARISON:  03/19/2018 FINDINGS: Mild patchy opacity in the right mid lung and bilateral lower lobes, suspicious for multifocal pneumonia. No pleural effusion or pneumothorax. The heart is  normal in size. Right chest power port terminates at the cavoatrial junction. IMPRESSION: Multifocal patchy opacities, suspicious for pneumonia. Electronically Signed   By: Julian Hy M.D.   On: 04/18/2019 22:04    Microbiology: Recent Results (from the past 240 hour(s))  Urine culture     Status: Abnormal   Collection Time: 04/18/19  8:04 PM   Specimen: In/Out Cath Urine  Result Value Ref Range Status   Specimen Description   Final    IN/OUT CATH URINE Performed at [facility], [place]  [address] [place], [place] [address]    Special Requests   Final    NONE Performed at [facility], [place] [address] [place], [place] [address]    Culture MULTIPLE SPECIES PRESENT, SUGGEST RECOLLECTION (A)  Final   Report Status 04/20/2019 FINAL  Final  Blood Cultures (routine x 2)     Status: None (Preliminary result)   Collection Time: 04/18/19  8:28 PM   Specimen: BLOOD  Result Value Ref Range Status   Specimen Description   Final    BLOOD LEFT ANTECUBITAL Performed at [facility] [address], [place], [place] [address]    Special Requests   Final    BOTTLES DRAWN AEROBIC AND ANAEROBIC Blood Culture results may not be optimal due to an excessive volume of blood received in culture bottles Performed at [facility] [address] [place], [place] [address]    Culture   Final    NO GROWTH 3 DAYS Performed at [facility], [place] [address], [place], [place] [address]    Report Status PENDING  Incomplete  Blood Cultures (routine x 2)     Status: None (Preliminary result)   Collection Time: 04/18/19  8:33 PM   Specimen: BLOOD  Result Value Ref Range Status   Specimen Description   Final    BLOOD PORTA CATH Performed at [facility] [address], [place], [place] [address]    Special Requests   Final    BOTTLES DRAWN AEROBIC AND ANAEROBIC Blood Culture adequate volume Performed  at [facility] [address], [place], [place] [address]    Culture   Final    NO GROWTH 3 DAYS Performed at [facility], [place] [address] [place],  [address]    Report Status PENDING  Incomplete  Respiratory Panel by RT PCR (Flu A&B, Covid) - Nasopharyngeal Swab     Status: None   Collection Time: 04/19/19  2:13 AM   Specimen: Nasopharyngeal Swab  Result Value Ref Range Status   SARS Coronavirus 2 by RT PCR NEGATIVE NEGATIVE Final    Comment: (NOTE) SARS-CoV-2 target nucleic acids are NOT DETECTED. The SARS-CoV-2 RNA is generally detectable in upper respiratoy specimens during the acute phase of infection. The lowest concentration of SARS-CoV-2 viral copies this assay can detect is 131 copies/mL. A negative result does not preclude SARS-Cov-2 infection and should not be used as the sole basis for treatment or other patient management decisions. A negative result may occur with  improper specimen collection/handling, submission of specimen other than nasopharyngeal swab, presence of viral mutation(s) within the areas targeted by this assay, and inadequate number of viral copies (<131 copies/mL). A negative result must be combined with clinical observations, patient history, and epidemiological information. The expected result is Negative. Fact Sheet for Patients:  PinkCheek.be Fact Sheet for Healthcare Providers:  GravelBags.it This test is not yet ap proved or cleared by the [place] FDA and  has been authorized for detection and/or diagnosis of SARS-CoV-2 by FDA under an Emergency Use Authorization (EUA). This EUA will remain  in effect (meaning this test can be used) for the duration of the COVID-19 declaration under Section 564(b)(1) of the Act, 21 U.S.C. section 360bbb-3(b)(1), unless the authorization is terminated or revoked sooner.    Influenza A by PCR NEGATIVE NEGATIVE  Final   Influenza B by PCR NEGATIVE NEGATIVE Final    Comment: (NOTE) The Xpert Xpress SARS-CoV-2/FLU/RSV assay  is intended as an aid in  the diagnosis of influenza from Nasopharyngeal swab specimens and  should not be used as a sole basis for treatment. Nasal washings and  aspirates are unacceptable for Xpert Xpress SARS-CoV-2/FLU/RSV  testing. Fact Sheet for Patients: PinkCheek.be Fact Sheet for Healthcare Providers: GravelBags.it This test is not yet approved or cleared by the [place] FDA and  has been authorized for detection and/or diagnosis of SARS-CoV-2 by  FDA under an Emergency Use Authorization (EUA). This EUA will remain  in effect (meaning this test can be used) for the duration of the  Covid-19 declaration under Section 564(b)(1) of the Act, 21  U.S.C. section 360bbb-3(b)(1), unless the authorization is  terminated or revoked. Performed at [facility], [place] [address], [place], [place] [address]      Labs: Basic Metabolic Panel: Recent Labs  Lab 04/18/19 2004 04/19/19 0500 04/20/19 0359 04/21/19 0300 04/21/19 0941 04/22/19 0435  NA 133* 133* 132* 132*  --  131*  K 4.2 3.9 2.7* 3.7  --  3.5  CL 103 102 105 108  --  104  CO2 19* 19* 20* 17*  --  19*  GLUCOSE 96 91 85 77  --  95  BUN _0 7*  --  8  CREATININE 1.56* 1.42* 1.25* 1.27*  --  1.26*  CALCIUM 7.4* 6.7* 6.4* 7.2*  --  7.7*  MG  --  1.0*  --   --  0.9* 1.5*  PHOS  --  3.8  --   --   --  2.5   Liver Function Tests: Recent Labs  Lab 04/18/19 2004 04/19/19 0500 04/21/19 0941 04/22/19 0435  AST 23 14* 17 16  ALT 40 _1 ALKPHOS 50 34* 49 53  BILITOT 0.8 0.7 0.3 0.3  PROT 6.7 5.3* 5.5* 5.5*  ALBUMIN 3.3* 2.5* 2.4* 2.4*   Recent Labs  Lab 04/18/19 2004  LIPASE 46   No results for input(s): AMMONIA in the last 168 hours. CBC: Recent Labs  Lab 04/18/19 2004 04/19/19 0500 04/20/19 0359 04/21/19 0300  04/22/19 0435  WBC 15.0* 15.8* 13.0* 9.3 5.7  NEUTROABS 12.9*  --   --   --  4.3  HGB 9.6* 8.0* 7.2* 8.9* 8.9*  HCT 29.3* 24.5* 21.9* 27.6* 26.5*  MCV 86.4 85.7 86.6 89.0 88.3  PLT 167 120* 113* 105* 94*   Cardiac Enzymes: No results for input(s): CKTOTAL, CKMB, CKMBINDEX, TROPONINI in the last 168 hours. BNP: BNP (last 3 results) No results for input(s): BNP in the last 8760 hours.  ProBNP (last 3 results) No results for input(s): PROBNP in the last 8760 hours.  CBG: No results for input(s): GLUCAP in the last 168 hours.     Signed:  Irine Seal MD.  [facility] 04/22/2019, 4:10 PM

## 2019-04-22 NOTE — Progress Notes (Signed)
I spoke with his daughter over the phone She would like to take care of the patient at home without resorting to skilled nursing facility.  She says she is a very experienced CNA. I have reviewed this with hospitalist Hopefully, he can be discharged home soon From my standpoint, he can be discharged if ready He has appointment to see me next week

## 2019-04-22 NOTE — Plan of Care (Signed)
Discharge instructions reviewed with patient and wife, questions answered, Rx faxed to [facility] in [facility], hard copy sent with wife.  Patient transported via wheelchair to main entrance to be taken home by daughter, 3n1 and rollator sent with patient.

## 2019-04-22 NOTE — Evaluation (Signed)
Occupational Therapy Evaluation Patient Details Name: Calvin Mccormick MRN: [id number] DOB: Jun 07, 1945 Today's Date: 04/22/2019    History of Present Illness Calvin Mccormick is a 74 y.o. male with medical history significant of relapsing multiple myeloma, anemia of chronic renal disease, CKD, chronic diarrhea, hypokalemia. Presented with confusion and vomiting possibly febrile    Clinical Impression   Pt reports that his PLOF was mod I for mobility with RW/SPC and mod I for ADL with PRN assist for LB ADL. Today Pt is mod A for sit<>stand from elevated bed (+2 for safety), dependent on RW in standing requiring sitting for all ADL tasks. He is max A for LB ADL at this time, and will benefit from skilled OT in the acute setting as well as afterwards at the [facility] level to maximize safety and independence in ADL and functional transfers. Pt shared that he needs a new shower seat, and as he only was able to walk 8 ft today highly recommend BSC. Pt would benefit from energy conservation education next session as well as education for AE to assist with LB ADL.     Follow Up Recommendations  Home health OT;Supervision/Assistance - 24 hour(initially)    Equipment Recommendations  Tub/shower seat;3 in 1 bedside commode(needs BOTH)    Recommendations for Other Services       Precautions / Restrictions Precautions Precautions: Fall Restrictions Weight Bearing Restrictions: No      Mobility Bed Mobility Overal bed mobility: Needs Assistance Bed Mobility: Supine to Sit     Supine to sit: Supervision     General bed mobility comments: verbal cues for hand placement and sequencing to come to EOB, min assist to scoot to EOB to place feet flat on floor  Transfers Overall transfer level: Needs assistance Equipment used: Rolling walker (2 wheeled) Transfers: Sit to/from Stand Sit to Stand: Mod assist;+2 safety/equipment;From elevated surface(bed elevated)         General transfer comment: mod  assist to power up, slow to extend bil knees and hips, maintains flexed trunk posture with dependence on RW for steadying    Balance Overall balance assessment: Needs assistance;History of Falls Sitting-balance support: Feet supported;No upper extremity supported Sitting balance-Leahy Scale: Fair Sitting balance - Comments: seated EOB   Standing balance support: During functional activity;Bilateral upper extremity supported Standing balance-Leahy Scale: Poor Standing balance comment: dependent on RW for steadying                           ADL either performed or assessed with clinical judgement   ADL Overall ADL's : Needs assistance/impaired Eating/Feeding: Set up   Grooming: Wash/dry hands;Wash/dry face;Set up;Sitting Grooming Details (indicate cue type and reason): unable to perform in standing at this time Upper Body Bathing: Moderate assistance   Lower Body Bathing: Maximal assistance   Upper Body Dressing : Minimal assistance   Lower Body Dressing: Maximal assistance Lower Body Dressing Details (indicate cue type and reason): EOB, unable to don socks - "I sometimes need help with this at home too" Toilet Transfer: Moderate assistance;+2 for safety/equipment;Stand-pivot;BSC Toilet Transfer Details (indicate cue type and reason): from elevated bed, vc for safe hand placement Toileting- Clothing Manipulation and Hygiene: Maximal assistance;+2 for safety/equipment;Sit to/from stand Toileting - Clothing Manipulation Details (indicate cue type and reason): requires BUE in standing     Functional mobility during ADLs: Moderate assistance;+2 for safety/equipment;Rolling walker;Cueing for sequencing;Cueing for safety General ADL Comments: Pt with decreased activity tolerance, balance,  and requires increased assist for ADL - especially transfers and LB ADL     Vision Baseline Vision/History: No visual deficits Patient Visual Report: No change from baseline        Perception     Praxis      Pertinent Vitals/Pain Pain Assessment: 0-10 Pain Score: 8  Pain Location: BLE Pain Descriptors / Indicators: Aching;Sore Pain Intervention(s): Limited activity within patient's tolerance;Monitored during session;RN gave pain meds during session     Hand Dominance Right   Extremity/Trunk Assessment Upper Extremity Assessment Upper Extremity Assessment: Generalized weakness(some fine motor deficits - Pt reports baseline)   Lower Extremity Assessment Lower Extremity Assessment: Defer to PT evaluation   Cervical / Trunk Assessment Cervical / Trunk Assessment: Kyphotic   Communication Communication Communication: No difficulties   Cognition Arousal/Alertness: Awake/alert Behavior During Therapy: WFL for tasks assessed/performed Overall Cognitive Status: Impaired/Different from baseline Area of Impairment: Safety/judgement                         Safety/Judgement: Decreased awareness of deficits;Decreased awareness of safety     General Comments: occasionally slow to respond, but appropriate   General Comments  Pt reports falling on Sunday but didn't hit head head; on RA and SpO2 99-100% with mobility    Exercises     Shoulder Instructions      Home Living Family/patient expects to be discharged to:: Private residence Living Arrangements: Spouse/significant other Available Help at Discharge: Family;Available 24 hours/day(daughter is CNA) Type of Home: House Home Access: Ramped entrance     Home Layout: One level     Bathroom Shower/Tub: Teacher, early years/pre: Standard Bathroom Accessibility: Yes How Accessible: Accessible via walker Home Equipment: Grab bars - tub/shower;Walker - 2 wheels;Cane - single point          Prior Functioning/Environment Level of Independence: Independent with assistive device(s)        Comments: needs a new shower chair goes back and forth with SPC and RW        OT  Problem List: Decreased strength;Decreased range of motion;Decreased activity tolerance;Impaired balance (sitting and/or standing);Decreased safety awareness;Pain      OT Treatment/Interventions: Self-care/ADL training;Energy conservation;DME and/or AE instruction;Therapeutic activities;Patient/family education;Balance training    OT Goals(Current goals can be found in the care plan section) Acute Rehab OT Goals Patient Stated Goal: go back home OT Goal Formulation: With patient Time For Goal Achievement: 05/06/19 Potential to Achieve Goals: Good ADL Goals Pt Will Perform Grooming: with modified independence;sitting Pt Will Perform Upper Body Dressing: with modified independence;sitting Pt Will Perform Lower Body Dressing: with min guard assist;with adaptive equipment;sitting/lateral leans Pt Will Transfer to Toilet: with min guard assist;stand pivot transfer;bedside commode Pt Will Perform Toileting - Clothing Manipulation and hygiene: with supervision;sitting/lateral leans Pt Will Perform Tub/Shower Transfer: Tub transfer;with min assist;shower seat;rolling walker Additional ADL Goal #1: Pt will recall 3 ways of conserving energy during ADL routine with 1 or less verbal cues  OT Frequency: Min 2X/week   Barriers to D/C:            Co-evaluation PT/OT/SLP Co-Evaluation/Treatment: Yes Reason for Co-Treatment: For patient/therapist safety;To address functional/ADL transfers PT goals addressed during session: Mobility/safety with mobility;Balance;Proper use of DME OT goals addressed during session: ADL's and self-care;Proper use of Adaptive equipment and DME      AM-PAC OT "6 Clicks" Daily Activity     Outcome Measure Help from another person eating meals?: None Help from another  person taking care of personal grooming?: A Little Help from another person toileting, which includes using toliet, bedpan, or urinal?: A Lot Help from another person bathing (including washing, rinsing,  drying)?: A Lot Help from another person to put on and taking off regular upper body clothing?: A Little Help from another person to put on and taking off regular lower body clothing?: A Lot 6 Click Score: 16   End of Session Equipment Utilized During Treatment: Gait belt;Rolling walker Nurse Communication: Mobility status  Activity Tolerance: Patient tolerated treatment well Patient left: in chair;with call bell/phone within reach;with chair alarm set  OT Visit Diagnosis: Unsteadiness on feet (R26.81);Other abnormalities of gait and mobility (R26.89);History of falling (Z91.81);Muscle weakness (generalized) (M62.81);Pain Pain - Right/Left: Right(Bilateral) Pain - part of body: Leg                Time: 3838-1840 OT Time Calculation (min): 35 min Charges:  OT General Charges $OT Visit: 1 Visit OT Evaluation $OT Eval Moderate Complexity: Palmer OTR/L Acute Rehabilitation Services Pager: [phone] Office: [place] 04/22/2019, 11:43 AM

## 2019-04-22 NOTE — Progress Notes (Signed)
Calvin Mccormick   DOB:[birth year]   WE#:[id number]    ASSESSMENT & PLAN:  Multiple myeloma His recent myeloma panel showed positive response to treatment but due to his frail status and multiple complications, I will put his treatment on hold Continue supportive care  Acquired pancytopenia Due to treatment side effects He is frail with multiple co-morbidities; he has chronic renal failure as well as multiple myeloma and with recent chemotherapy, without blood transfusion, his blood count will not improve He has received 1 unit of blood recently with improvement No need transfusion today Due to prior exposure to daratumumab, blood typing is difficult.  I will work with blood bank to give him the best matched blood possible  Chronic kidney disease stage III Continue IV support  Streptococcal pneumonia, resolving Urinary streptococcal pneumonia antigen is positive Continue antibiotics  Multiple electrolyte imbalances Replace as needed  Moderate to severe protein calorie malnutrition Recommend nutritional supplement as tolerated  Reported decubitus ulcer, unknown stage He is getting aggressive nursing care  Code Status Full code  Goals of care & Discharge planning The patient had received 1 course of antibiotics 2 weeks ago for severe skin infection I recommend he completes the whole course of IV antibiotics in the hospital if possible Agree with physical therapy/Occupational Therapy to assess his strength; this has not been accomplished  He may or may not be able to go home and may need skilled nursing facility depending on assessment I have updated his wife.  She requested that I call his daughter which I attempted but not able to get hold of her.  I left her voicemail Overall, I do not believe he is ready to be discharged today I will continue to follow tomorrow All questions were answered. The patient knows to call the clinic with any problems, questions or concerns.   The  total time spent in the appointment was 25 minutes encounter with patients including review of chart and various tests results, discussions about plan of care and coordination of care plan  Heath Lark, MD 04/22/2019 8:28 AM  Subjective:  He is doing well.  He denies pain.  No recent nausea.  His appetite is good.  No fever.  He has not been able to get up out of his bed yet.  Physical therapy/occupational therapy assessment are pending  Objective:  Vitals:   04/21/19 2219 04/22/19 0512  BP: 125/71 (!) 154/82  Pulse: 71 71  Resp: 20 17  Temp: 99.3 F (37.4 C) 98.1 F (36.7 C)  SpO2: 100% 98%     Intake/Output Summary (Last 24 hours) at 04/22/2019 0828 Last data filed at 04/22/2019 6195 Gross per 24 hour  Intake 720 ml  Output 3050 ml  Net -2330 ml    GENERAL:alert, no distress and comfortable NEURO: alert & oriented x 3 with fluent speech, no focal motor/sensory deficits   Labs:  Recent Labs    04/19/19 0500 04/19/19 0500 04/20/19 0359 04/21/19 0300 04/21/19 0941 04/22/19 0435  NA 133*   < > 132* 132*  --  131*  K 3.9   < > 2.7* 3.7  --  3.5  CL 102   < > 105 108  --  104  CO2 19*   < > 20* 17*  --  19*  GLUCOSE 91   < > 85 77  --  95  BUN 19   < > 13 7*  --  8  CREATININE 1.42*   < > 1.25* 1.27*  --  1.26*  CALCIUM 6.7*   < > 6.4* 7.2*  --  7.7*  GFRNONAA 48*   < > 56* 55*  --  55*  GFRAA 55*   < > >60 >60  --  >60  PROT 5.3*  --   --   --  5.5* 5.5*  ALBUMIN 2.5*  --   --   --  2.4* 2.4*  AST 14*  --   --   --  17 16  ALT 27  --   --   --  30 27  ALKPHOS 34*  --   --   --  49 53  BILITOT 0.7  --   --   --  0.3 0.3  BILIDIR  --   --   --   --  0.1  --   IBILI  --   --   --   --  0.2*  --    < > = values in this interval not displayed.    Studies:  DG Chest Port 1 View  Result Date: 04/18/2019 CLINICAL DATA:  Weakness, vomiting EXAM: PORTABLE CHEST 1 VIEW COMPARISON:  03/19/2018 FINDINGS: Mild patchy opacity in the right mid lung and bilateral lower lobes,  suspicious for multifocal pneumonia. No pleural effusion or pneumothorax. The heart is normal in size. Right chest power port terminates at the cavoatrial junction. IMPRESSION: Multifocal patchy opacities, suspicious for pneumonia. Electronically Signed   By: Julian Hy M.D.   On: 04/18/2019 22:04

## 2019-04-23 LAB — PTH, INTACT AND CALCIUM
Calcium, Total (PTH): 7.3 mg/dL — ABNORMAL LOW (ref 8.6–10.2)
PTH: 80 pg/mL — ABNORMAL HIGH (ref 15–65)

## 2019-04-24 LAB — CULTURE, BLOOD (ROUTINE X 2)
Culture: NO GROWTH
Culture: NO GROWTH
Special Requests: ADEQUATE

## 2019-04-25 IMAGING — DX DG CHEST 1V PORT
1 series · 1 of 1 positions shown · non-contrast
Comparison: 04/07/2015

CLINICAL DATA: 71-year-old male with a history of vomiting.

EXAM:
PORTABLE CHEST 1 VIEW

[chest ap]
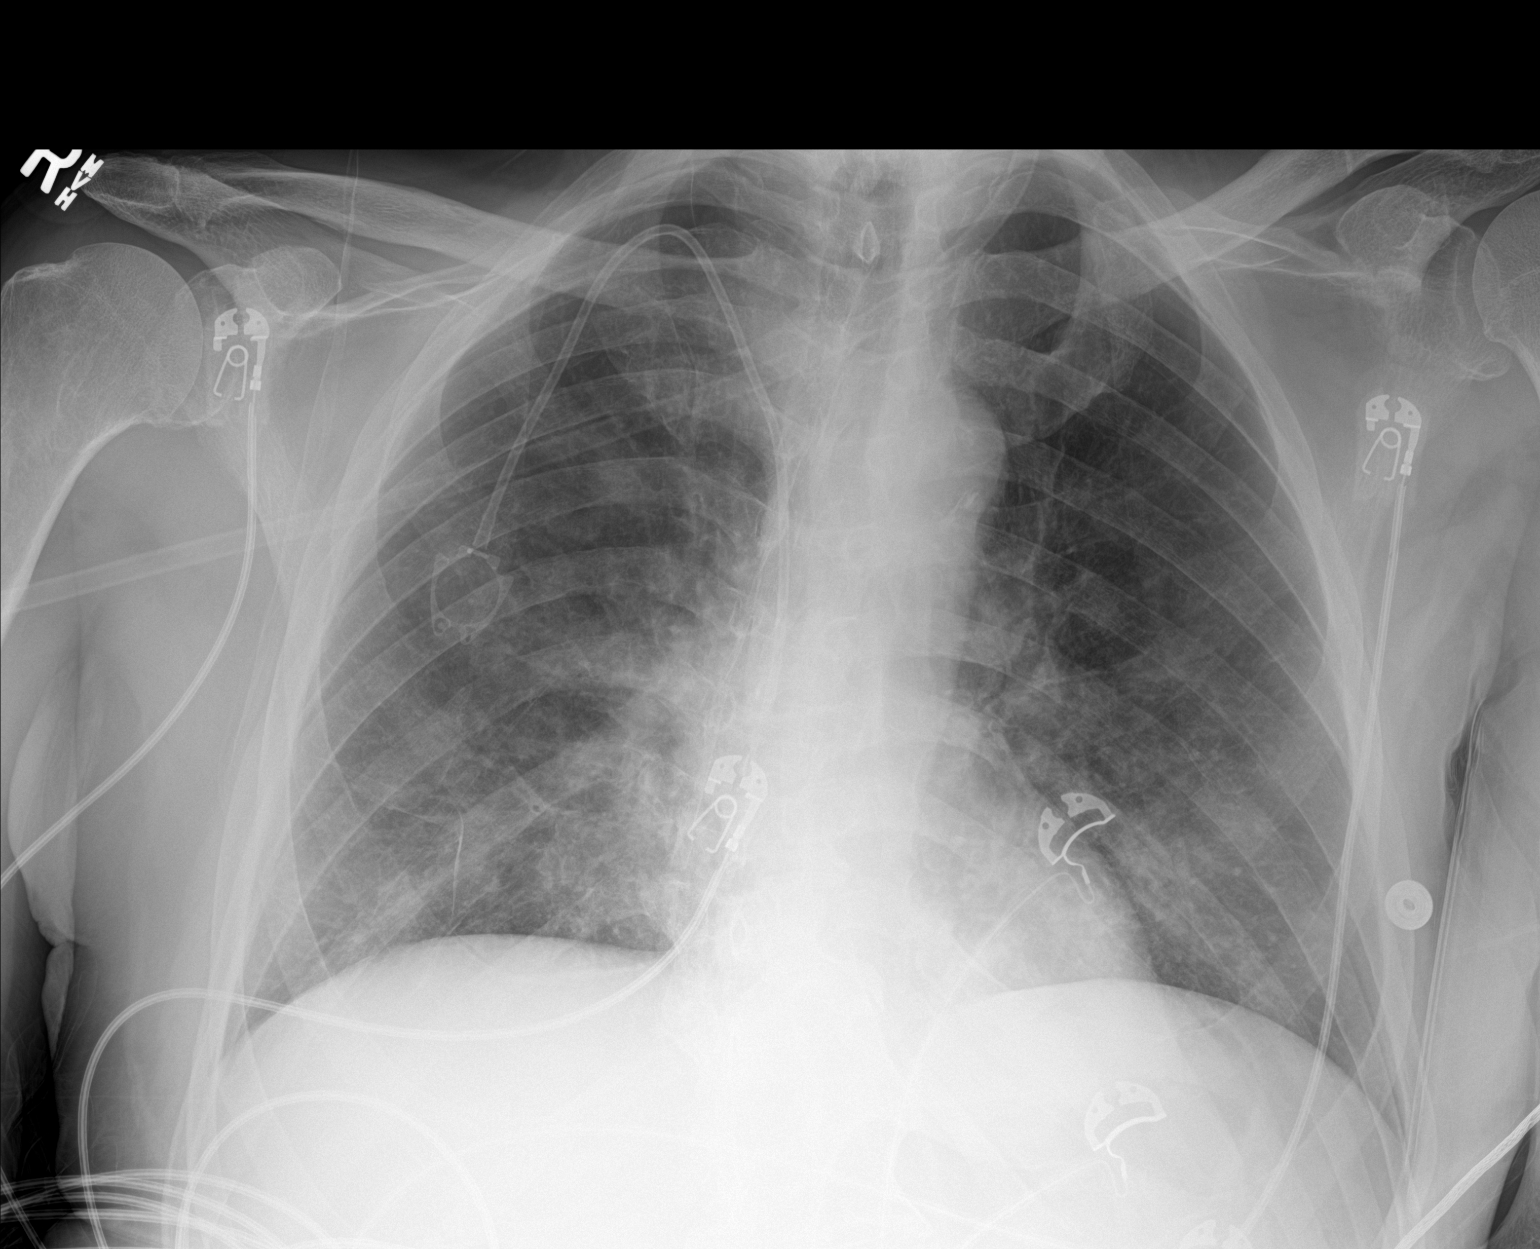

[1 of 1 positions shown; findings below may reference images not displayed]

FINDINGS: Cardiomediastinal silhouette unchanged in size and contour.

Patchy opacities at the bilateral lung bases with mild interlobular
septal thickening. No pneumothorax. No large pleural effusion.

Port catheter on the right chest wall via IJ approach with the
catheter appearing to terminate in the superior vena cava.

No displaced fracture
IMPRESSION: Ill-defined opacities at the lung bases with interlobular septal
thickening suggesting early pulmonary edema, however, atypical
infection not excluded.

Right IJ port catheter.

## 2019-04-27 ENCOUNTER — Telehealth: Payer: Self-pay

## 2019-04-27 ENCOUNTER — Inpatient Hospital Stay: Payer: Medicare Other

## 2019-04-27 ENCOUNTER — Other Ambulatory Visit: Payer: Self-pay | Admitting: Hematology and Oncology

## 2019-04-27 ENCOUNTER — Encounter: Payer: Self-pay | Admitting: Hematology and Oncology

## 2019-04-27 ENCOUNTER — Inpatient Hospital Stay: Payer: Medicare Other | Admitting: Hematology and Oncology

## 2019-04-27 ENCOUNTER — Other Ambulatory Visit: Payer: Self-pay

## 2019-04-27 DIAGNOSIS — C9002 Multiple myeloma in relapse: Secondary | ICD-10-CM

## 2019-04-27 DIAGNOSIS — D631 Anemia in chronic kidney disease: Secondary | ICD-10-CM

## 2019-04-27 DIAGNOSIS — E860 Dehydration: Secondary | ICD-10-CM | POA: Diagnosis not present

## 2019-04-27 DIAGNOSIS — E875 Hyperkalemia: Secondary | ICD-10-CM | POA: Diagnosis not present

## 2019-04-27 DIAGNOSIS — D539 Nutritional anemia, unspecified: Secondary | ICD-10-CM

## 2019-04-27 DIAGNOSIS — L89150 Pressure ulcer of sacral region, unstageable: Secondary | ICD-10-CM

## 2019-04-27 DIAGNOSIS — Z79899 Other long term (current) drug therapy: Secondary | ICD-10-CM | POA: Diagnosis not present

## 2019-04-27 DIAGNOSIS — Z7952 Long term (current) use of systemic steroids: Secondary | ICD-10-CM | POA: Diagnosis not present

## 2019-04-27 DIAGNOSIS — K529 Noninfective gastroenteritis and colitis, unspecified: Secondary | ICD-10-CM | POA: Diagnosis not present

## 2019-04-27 DIAGNOSIS — N183 Chronic kidney disease, stage 3 unspecified: Secondary | ICD-10-CM

## 2019-04-27 DIAGNOSIS — M8580 Other specified disorders of bone density and structure, unspecified site: Secondary | ICD-10-CM | POA: Diagnosis not present

## 2019-04-27 DIAGNOSIS — D61818 Other pancytopenia: Secondary | ICD-10-CM | POA: Diagnosis not present

## 2019-04-27 DIAGNOSIS — K449 Diaphragmatic hernia without obstruction or gangrene: Secondary | ICD-10-CM | POA: Diagnosis not present

## 2019-04-27 DIAGNOSIS — Z9221 Personal history of antineoplastic chemotherapy: Secondary | ICD-10-CM | POA: Diagnosis not present

## 2019-04-27 LAB — COMPREHENSIVE METABOLIC PANEL
ALT: 15 U/L (ref 0–44)
AST: 12 U/L — ABNORMAL LOW (ref 15–41)
Albumin: 2.5 g/dL — ABNORMAL LOW (ref 3.5–5.0)
Alkaline Phosphatase: 62 U/L (ref 38–126)
Anion gap: 8 (ref 5–15)
BUN: 13 mg/dL (ref 8–23)
CO2: 21 mmol/L — ABNORMAL LOW (ref 22–32)
Calcium: 8.7 mg/dL — ABNORMAL LOW (ref 8.9–10.3)
Chloride: 103 mmol/L (ref 98–111)
Creatinine, Ser: 1.39 mg/dL — ABNORMAL HIGH (ref 0.61–1.24)
GFR calc Af Amer: 57 mL/min — ABNORMAL LOW (ref >60)
GFR calc non Af Amer: 49 mL/min — ABNORMAL LOW (ref >60)
Glucose, Bld: 87 mg/dL (ref 70–99)
Potassium: 4 mmol/L (ref 3.5–5.1)
Sodium: 132 mmol/L — ABNORMAL LOW (ref 135–145)
Total Bilirubin: 0.6 mg/dL (ref 0.3–1.2)
Total Protein: 6.5 g/dL (ref 6.5–8.1)

## 2019-04-27 LAB — CBC WITH DIFFERENTIAL/PLATELET
Abs Immature Granulocytes: 0.04 10*3/uL (ref 0.00–0.07)
Basophils Absolute: 0 10*3/uL (ref 0.0–0.1)
Basophils Relative: 0 %
Eosinophils Absolute: 0 10*3/uL (ref 0.0–0.5)
Eosinophils Relative: 0 %
HCT: 27.1 % — ABNORMAL LOW (ref 39.0–52.0)
Hemoglobin: 8.9 g/dL — ABNORMAL LOW (ref 13.0–17.0)
Immature Granulocytes: 1 %
Lymphocytes Relative: 37 %
Lymphs Abs: 2.5 10*3/uL (ref 0.7–4.0)
MCH: 29.4 pg (ref 26.0–34.0)
MCHC: 32.8 g/dL (ref 30.0–36.0)
MCV: 89.4 fL (ref 80.0–100.0)
Monocytes Absolute: 0.4 10*3/uL (ref 0.1–1.0)
Monocytes Relative: 6 %
Neutro Abs: 3.8 10*3/uL (ref 1.7–7.7)
Neutrophils Relative %: 56 %
Platelets: 76 10*3/uL — ABNORMAL LOW (ref 150–400)
RBC: 3.03 MIL/uL — ABNORMAL LOW (ref 4.22–5.81)
RDW: 23 % — ABNORMAL HIGH (ref 11.5–15.5)
WBC: 6.8 10*3/uL (ref 4.0–10.5)
nRBC: 0 % (ref 0.0–0.2)

## 2019-04-27 LAB — MAGNESIUM: Magnesium: 1.2 mg/dL — CL (ref 1.7–2.4)

## 2019-04-27 LAB — SAMPLE TO BLOOD BANK

## 2019-04-27 MED ORDER — MAGNESIUM OXIDE 400 (241.3 MG) MG PO TABS: 400.0000 mg | ORAL_TABLET | Freq: Every day | ORAL | 11 refills | Status: AC

## 2019-04-27 NOTE — Progress Notes (Signed)
Reported critical Magnesium 1.2 to Dr. Alvy Bimler.

## 2019-04-27 NOTE — Progress Notes (Signed)
[place] OFFICE PROGRESS NOTE  Patient Care Team: Lucianne Lei, MD as PCP - General (Family Medicine) Heath Lark, MD as Consulting Physician (Hematology and Oncology) Beverely Pace, LCSW as Social Worker Hotel manager)  ASSESSMENT & PLAN:  Multiple myeloma in relapse [facility]) His recent myeloma panel showed positive response to treatment He is still recovering from recent hospitalization He will continue to hold chemotherapy for now I will see him again next week for further follow-up If the pancytopenia improves and he continues to feel better, we will resume his treatment next week He is reminded to bring in all his pills next week  Deficiency anemia He has acquired pancytopenia due to recent infection and side effects from treatment He is not symptomatic Observe only for now He does not need transfusion support  Chronic renal insufficiency, stage III (moderate) His renal function is stable I continue to encourage him to increase oral intake as tolerated  Hypomagnesemia I recommend magnesium replacement therapy  Decubitus skin ulcer According to the patient, this is improving He will continue dressing changes for now   No orders of the defined types were placed in this encounter.   All questions were answered. The patient knows to call the clinic with any problems, questions or concerns. The total time spent in the appointment was 20 minutes encounter with patients including review of chart and various tests results, discussions about plan of care and coordination of care plan   Heath Lark, MD 04/27/2019 1:52 PM  INTERVAL HISTORY: Please see below for problem oriented charting. He returns with his wife for further follow-up He is getting better He has good appetite No fever or chills The decubitus ulcer is improving No recent bleeding  SUMMARY OF ONCOLOGIC HISTORY: Oncology History Overview Note  Progressed on Elotuzumab, revlimid,  Velcade, Daratumumab and Pomalyst   Multiple myeloma in relapse ([place])  12/12/2010 Initial Diagnosis   Multiple myeloma   02/16/2014 Imaging   Skeletal survey showed diffuse osteopenia   03/02/2014 Bone Marrow Biopsy   Accession: [id number] BM biopsy showed only 5 % plasma cells. However, the biopsy was difficult and the bone was fragmented during the procedure. Cytogenetics and FISH study is normal   03/03/2014 Procedure   he has port placement   03/10/2014 - 05/19/2014 Chemotherapy   he received elotuzumab and revlimid   05/20/2014 - 02/25/2016 Chemotherapy   Treatment is switched to maintenance Revlimid only. Treatment is stopped due to progressive disease   11/15/2014 Adverse Reaction   He has mild worsening anemia. Does of Revlimid reduced to 5 mg 21 days on, 7 days off   03/06/2016 -  Chemotherapy   He received Daratumumab and Velcade. Velcade is stopped on 07/24/16   03/06/2016 - 02/15/2019 Chemotherapy   The patient had daratumumab with Pomalyst for chemotherapy treatment.     08/14/2016 Miscellaneous   Pomalyst is added along with Daratumumab   10/17/2016 Surgery   EGD was performed for coffee-ground emesis. Multiple linear superficial ulcers were noted in the esophagus from 25-35 cm from insertion. Severe esophagitis was noted from 30-35 cm from insertion. Multiple biopsies were obtained. A small hiatal hernia was noted. The gastric cavity contained coffee-ground fluid, after suctioning and lavage, no obvious gastric lesions/erosion/ulceration/erythema was noted. Biopsies were taken from the antrum to rule out H. Pylori. Duodenum bulb appeared unremarkable. A small diverticulum was noted in second portion of the duodenum.     12/06/2016 Imaging   Ct scan abdomen No acute findings.  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  Stable small hiatal hernia.  Colonic diverticulosis, without radiographic evidence of diverticulitis.      REVIEW OF SYSTEMS:   Constitutional: Denies fevers, chills or abnormal weight loss Eyes: Denies blurriness of vision Ears, nose, mouth, throat, and face: Denies mucositis or sore throat Respiratory: Denies cough, dyspnea or wheezes Cardiovascular: Denies palpitation, chest discomfort or lower extremity swelling Gastrointestinal:  Denies nausea, heartburn or change in bowel habits Skin: Denies abnormal skin rashes Lymphatics: Denies new lymphadenopathy or easy bruising Neurological:Denies numbness, tingling or new weaknesses Behavioral/Psych: Mood is stable, no new changes  All other systems were reviewed with the patient and are negative.  I have reviewed the past medical history, past surgical history, social history and family history with the patient and they are unchanged from previous note.  ALLERGIES:  has No Known Allergies.  MEDICATIONS:  Current Outpatient Medications  Medication Sig Dispense Refill  . acyclovir (ZOVIRAX) 400 MG tablet Take 1 tablet (400 mg total) by mouth daily. 60 tablet 11  . allopurinol (ZYLOPRIM) 100 MG tablet Take 100 mg by mouth 2 (two) times daily.     Marland Kitchen amLODipine (NORVASC) 10 MG tablet Take 10 mg by mouth daily.  1  . aspirin EC 81 MG tablet Take 81 mg by mouth daily.    Marland Kitchen atorvastatin (LIPITOR) 20 MG tablet Take 20 mg by mouth at bedtime.     . brimonidine (ALPHAGAN) 0.2 % ophthalmic solution Place 1 drop into the right eye 3 (three) times daily.     . calcium carbonate (TUMS - DOSED IN MG ELEMENTAL CALCIUM) 500 MG chewable tablet Chew 1 tablet by mouth daily as needed for indigestion or heartburn.    . calcium-vitamin D (OSCAL WITH D) 500-200 MG-UNIT tablet Take 2 tablets by mouth 3 (three) times daily.    Marland Kitchen dexamethasone (DECADRON) 4 MG tablet Take 5 tabs on days 1 and 3 every week, take with food, preferably in the morning 40 tablet 11  . dorzolamide-timolol (COSOPT) 22.3-6.8 MG/ML ophthalmic solution Place 1 drop into the right eye 2 (two)  times daily.     Arna Medici 75 MCG tablet Take 75 mcg by mouth daily before breakfast.    . fluorometholone (FML) 0.1 % ophthalmic suspension Place 1 drop into the right eye daily.     Marland Kitchen latanoprost (XALATAN) 0.005 % ophthalmic solution Place 1 drop into the right eye at bedtime.    . lidocaine-prilocaine (EMLA) cream Apply to affected area once 30 g 3  . magnesium oxide (MAG-OX) 400 (241.3 Mg) MG tablet Take 1 tablet (400 mg total) by mouth daily. 30 tablet 11  . ondansetron (ZOFRAN) 8 MG tablet Take 1 tablet (8 mg total) by mouth every 8 (eight) hours as needed (Nausea or vomiting). 30 tablet 1  . pantoprazole (PROTONIX) 40 MG tablet TAKE 1 TABLET BY MOUTH EVERY DAY 90 tablet 5  . prochlorperazine (COMPAZINE) 10 MG tablet Take 1 tablet (10 mg total) by mouth every 6 (six) hours as needed for nausea or vomiting. 30 tablet 1   No current facility-administered medications for this visit.    PHYSICAL EXAMINATION: ECOG PERFORMANCE STATUS: 2 - Symptomatic, <50% confined to bed  Vitals:   04/27/19 1236  BP: (!) 144/62  Pulse: 62  Resp: 18  Temp: 99.1 F (37.3 C)  SpO2: 100%   Filed Weights   04/27/19 1236  Weight: 144 lb 6.4 oz (65.5 kg)    GENERAL:alert, no  distress and comfortable SKIN: skin color, texture, turgor are normal, no rashes or significant lesions EYES: normal, Conjunctiva are pink and non-injected, sclera clear OROPHARYNX:no exudate, no erythema and lips, buccal mucosa, and tongue normal  NECK: supple, thyroid normal size, non-tender, without nodularity LYMPH:  no palpable lymphadenopathy in the cervical, axillary or inguinal LUNGS: Mild bibasilar crackles but normal breathing effort HEART: regular rate & rhythm and no murmurs and no lower extremity edema ABDOMEN:abdomen soft, non-tender and normal bowel sounds Musculoskeletal:no cyanosis of digits and no clubbing  NEURO: alert & oriented x 3 with fluent speech, no focal motor/sensory deficits  LABORATORY DATA:  I  have reviewed the data as listed    Component Value Date/Time   NA 132 (L) 04/27/2019 1200   NA 140 01/09/2017 0913   K 4.0 04/27/2019 1200   K 3.7 01/09/2017 0913   CL 103 04/27/2019 1200   CL 107 06/29/2012 1131   CO2 21 (L) 04/27/2019 1200   CO2 21 (L) 01/09/2017 0913   GLUCOSE 87 04/27/2019 1200   GLUCOSE 80 01/09/2017 0913   GLUCOSE 143 (H) 06/29/2012 1131   BUN 13 04/27/2019 1200   BUN 9.8 01/09/2017 0913   CREATININE 1.39 (H) 04/27/2019 1200   CREATININE 1.85 (H) 07/06/2018 0935   CREATININE 1.5 (H) 01/09/2017 0913   CALCIUM 8.7 (L) 04/27/2019 1200   CALCIUM 7.3 (L) 04/22/2019 0435   CALCIUM 7.5 (L) 01/09/2017 0913   PROT 6.5 04/27/2019 1200   PROT 5.7 (L) 01/09/2017 0913   ALBUMIN 2.5 (L) 04/27/2019 1200   ALBUMIN 2.9 (L) 01/09/2017 0913   AST 12 (L) 04/27/2019 1200   AST 14 (L) 07/06/2018 0935   AST 12 01/09/2017 0913   ALT 15 04/27/2019 1200   ALT 11 07/06/2018 0935   ALT 10 01/09/2017 0913   ALKPHOS 62 04/27/2019 1200   ALKPHOS 55 01/09/2017 0913   BILITOT 0.6 04/27/2019 1200   BILITOT 0.5 07/06/2018 0935   BILITOT 0.44 01/09/2017 0913   GFRNONAA 49 (L) 04/27/2019 1200   GFRNONAA 35 (L) 07/06/2018 0935   GFRAA 57 (L) 04/27/2019 1200   GFRAA 40 (L) 07/06/2018 0935    No results found for: SPEP, UPEP  Lab Results  Component Value Date   WBC 6.8 04/27/2019   NEUTROABS 3.8 04/27/2019   HGB 8.9 (L) 04/27/2019   HCT 27.1 (L) 04/27/2019   MCV 89.4 04/27/2019   PLT 76 (L) 04/27/2019      Chemistry      Component Value Date/Time   NA 132 (L) 04/27/2019 1200   NA 140 01/09/2017 0913   K 4.0 04/27/2019 1200   K 3.7 01/09/2017 0913   CL 103 04/27/2019 1200   CL 107 06/29/2012 1131   CO2 21 (L) 04/27/2019 1200   CO2 21 (L) 01/09/2017 0913   BUN 13 04/27/2019 1200   BUN 9.8 01/09/2017 0913   CREATININE 1.39 (H) 04/27/2019 1200   CREATININE 1.85 (H) 07/06/2018 0935   CREATININE 1.5 (H) 01/09/2017 0913      Component Value Date/Time   CALCIUM 8.7  (L) 04/27/2019 1200   CALCIUM 7.3 (L) 04/22/2019 0435   CALCIUM 7.5 (L) 01/09/2017 0913   ALKPHOS 62 04/27/2019 1200   ALKPHOS 55 01/09/2017 0913   AST 12 (L) 04/27/2019 1200   AST 14 (L) 07/06/2018 0935   AST 12 01/09/2017 0913   ALT 15 04/27/2019 1200   ALT 11 07/06/2018 0935   ALT 10 01/09/2017 0913   BILITOT 0.6  04/27/2019 1200   BILITOT 0.5 07/06/2018 0935   BILITOT 0.44 01/09/2017 0913       RADIOGRAPHIC STUDIES: I have personally reviewed the radiological images as listed and agreed with the findings in the report. DG Chest Port 1 View  Result Date: 04/18/2019 CLINICAL DATA:  Weakness, vomiting EXAM: PORTABLE CHEST 1 VIEW COMPARISON:  03/19/2018 FINDINGS: Mild patchy opacity in the right mid lung and bilateral lower lobes, suspicious for multifocal pneumonia. No pleural effusion or pneumothorax. The heart is normal in size. Right chest power port terminates at the cavoatrial junction. IMPRESSION: Multifocal patchy opacities, suspicious for pneumonia. Electronically Signed   By: Julian Hy M.D.   On: 04/18/2019 22:04

## 2019-04-27 NOTE — Assessment & Plan Note (Signed)
I recommend magnesium replacement therapy 

## 2019-04-27 NOTE — Telephone Encounter (Signed)
Called and given magnesium results. Instructed to pick up Magnesium Rx at [facility] and take daily. Patient and wife verbalized understanding.

## 2019-04-27 NOTE — Assessment & Plan Note (Signed)
His recent myeloma panel showed positive response to treatment He is still recovering from recent hospitalization He will continue to hold chemotherapy for now I will see him again next week for further follow-up If the pancytopenia improves and he continues to feel better, we will resume his treatment next week He is reminded to bring in all his pills next week

## 2019-04-27 NOTE — Assessment & Plan Note (Signed)
According to the patient, this is improving He will continue dressing changes for now

## 2019-04-27 NOTE — Assessment & Plan Note (Signed)
He has acquired pancytopenia due to recent infection and side effects from treatment He is not symptomatic Observe only for now He does not need transfusion support

## 2019-04-27 NOTE — Assessment & Plan Note (Signed)
His renal function is stable I continue to encourage him to increase oral intake as tolerated

## 2019-04-28 ENCOUNTER — Telehealth: Payer: Self-pay | Admitting: Hematology and Oncology

## 2019-04-28 NOTE — Telephone Encounter (Signed)
No new orders per 4/20 los. No changes made to pt's schedule.

## 2019-05-04 ENCOUNTER — Inpatient Hospital Stay: Payer: Medicare Other | Admitting: Hematology and Oncology

## 2019-05-04 ENCOUNTER — Inpatient Hospital Stay: Payer: Medicare Other

## 2019-05-04 ENCOUNTER — Telehealth: Payer: Self-pay | Admitting: Hematology and Oncology

## 2019-05-04 ENCOUNTER — Other Ambulatory Visit: Payer: Self-pay

## 2019-05-04 ENCOUNTER — Encounter: Payer: Self-pay | Admitting: Hematology and Oncology

## 2019-05-04 DIAGNOSIS — Z7952 Long term (current) use of systemic steroids: Secondary | ICD-10-CM | POA: Diagnosis not present

## 2019-05-04 DIAGNOSIS — Z79899 Other long term (current) drug therapy: Secondary | ICD-10-CM | POA: Diagnosis not present

## 2019-05-04 DIAGNOSIS — E860 Dehydration: Secondary | ICD-10-CM | POA: Diagnosis not present

## 2019-05-04 DIAGNOSIS — C9002 Multiple myeloma in relapse: Secondary | ICD-10-CM

## 2019-05-04 DIAGNOSIS — D539 Nutritional anemia, unspecified: Secondary | ICD-10-CM | POA: Diagnosis not present

## 2019-05-04 DIAGNOSIS — Z9221 Personal history of antineoplastic chemotherapy: Secondary | ICD-10-CM | POA: Diagnosis not present

## 2019-05-04 DIAGNOSIS — K449 Diaphragmatic hernia without obstruction or gangrene: Secondary | ICD-10-CM | POA: Diagnosis not present

## 2019-05-04 DIAGNOSIS — N183 Chronic kidney disease, stage 3 unspecified: Secondary | ICD-10-CM

## 2019-05-04 DIAGNOSIS — D61818 Other pancytopenia: Secondary | ICD-10-CM

## 2019-05-04 DIAGNOSIS — E875 Hyperkalemia: Secondary | ICD-10-CM | POA: Diagnosis not present

## 2019-05-04 DIAGNOSIS — M8580 Other specified disorders of bone density and structure, unspecified site: Secondary | ICD-10-CM | POA: Diagnosis not present

## 2019-05-04 DIAGNOSIS — K529 Noninfective gastroenteritis and colitis, unspecified: Secondary | ICD-10-CM | POA: Diagnosis not present

## 2019-05-04 LAB — COMPREHENSIVE METABOLIC PANEL
ALT: 13 U/L (ref 0–44)
AST: 15 U/L (ref 15–41)
Albumin: 2.9 g/dL — ABNORMAL LOW (ref 3.5–5.0)
Alkaline Phosphatase: 70 U/L (ref 38–126)
Anion gap: 8 (ref 5–15)
BUN: 14 mg/dL (ref 8–23)
CO2: 21 mmol/L — ABNORMAL LOW (ref 22–32)
Calcium: 8.7 mg/dL — ABNORMAL LOW (ref 8.9–10.3)
Chloride: 107 mmol/L (ref 98–111)
Creatinine, Ser: 1.61 mg/dL — ABNORMAL HIGH (ref 0.61–1.24)
GFR calc Af Amer: 47 mL/min — ABNORMAL LOW (ref >60)
GFR calc non Af Amer: 41 mL/min — ABNORMAL LOW (ref >60)
Glucose, Bld: 119 mg/dL — ABNORMAL HIGH (ref 70–99)
Potassium: 3.5 mmol/L (ref 3.5–5.1)
Sodium: 136 mmol/L (ref 135–145)
Total Bilirubin: 0.5 mg/dL (ref 0.3–1.2)
Total Protein: 6.8 g/dL (ref 6.5–8.1)

## 2019-05-04 LAB — CBC WITH DIFFERENTIAL/PLATELET
Abs Immature Granulocytes: 0.03 10*3/uL (ref 0.00–0.07)
Basophils Absolute: 0 10*3/uL (ref 0.0–0.1)
Basophils Relative: 0 %
Eosinophils Absolute: 0 10*3/uL (ref 0.0–0.5)
Eosinophils Relative: 0 %
HCT: 26.3 % — ABNORMAL LOW (ref 39.0–52.0)
Hemoglobin: 8.5 g/dL — ABNORMAL LOW (ref 13.0–17.0)
Immature Granulocytes: 0 %
Lymphocytes Relative: 39 %
Lymphs Abs: 2.8 10*3/uL (ref 0.7–4.0)
MCH: 29.4 pg (ref 26.0–34.0)
MCHC: 32.3 g/dL (ref 30.0–36.0)
MCV: 91 fL (ref 80.0–100.0)
Monocytes Absolute: 0.4 10*3/uL (ref 0.1–1.0)
Monocytes Relative: 5 %
Neutro Abs: 3.8 10*3/uL (ref 1.7–7.7)
Neutrophils Relative %: 56 %
Platelets: 92 10*3/uL — ABNORMAL LOW (ref 150–400)
RBC: 2.89 MIL/uL — ABNORMAL LOW (ref 4.22–5.81)
RDW: 23.1 % — ABNORMAL HIGH (ref 11.5–15.5)
WBC: 7 10*3/uL (ref 4.0–10.5)
nRBC: 0 % (ref 0.0–0.2)

## 2019-05-04 LAB — SAMPLE TO BLOOD BANK

## 2019-05-04 LAB — MAGNESIUM: Magnesium: 1.2 mg/dL — CL (ref 1.7–2.4)

## 2019-05-04 NOTE — Progress Notes (Signed)
Critical Magnesium 1.2 reported to Dr. Alvy Bimler.

## 2019-05-04 NOTE — Assessment & Plan Note (Signed)
Overall, he is improving since the last time I saw him He is not symptomatic He does not need transfusion support

## 2019-05-04 NOTE — Patient Instructions (Signed)
Take 2 pills of dexamethasone today and Thursday morning Take 2 pills twice a day today and Thursday for selinexor

## 2019-05-04 NOTE — Assessment & Plan Note (Signed)
Overall, he is improving clinically since recent hospitalization I recommend he resume his chemotherapy at half a dose Specifically, he will start dexamethasone only at 8 mg today and Thursday and [name] at 40 mg twice a day today and Thursday I will see him back next week for further follow-up If he tolerated treatment well, we will gradually increase his chemotherapy to full dose

## 2019-05-04 NOTE — Assessment & Plan Note (Signed)
His renal function is stable I continue to encourage him to increase oral intake as tolerated

## 2019-05-04 NOTE — Assessment & Plan Note (Signed)
His magnesium level is still low He will continue 1 oral magnesium oxide daily We will continue to monitor carefully

## 2019-05-04 NOTE — Telephone Encounter (Signed)
Scheduled appts per 4/27 sch msg. Left voicemail with appt date and time.

## 2019-05-04 NOTE — Progress Notes (Signed)
[place] OFFICE PROGRESS NOTE  Patient Care Team: Lucianne Lei, MD as PCP - General (Family Medicine) Heath Lark, MD as Consulting Physician (Hematology and Oncology) Beverely Pace, LCSW as Social Worker (General Practice)  ASSESSMENT & PLAN:  Multiple myeloma in relapse ([place]) Overall, he is improving clinically since recent hospitalization I recommend he resume his chemotherapy at half a dose Specifically, he will start dexamethasone only at 8 mg today and Thursday and [name] at 40 mg twice a day today and Thursday I will see him back next week for further follow-up If he tolerated treatment well, we will gradually increase his chemotherapy to full dose  Pancytopenia, acquired ([place]) Overall, he is improving since the last time I saw him He is not symptomatic He does not need transfusion support  Chronic renal insufficiency, stage III (moderate) His renal function is stable I continue to encourage him to increase oral intake as tolerated  Hypomagnesemia His magnesium level is still low He will continue 1 oral magnesium oxide daily We will continue to monitor carefully   No orders of the defined types were placed in this encounter.   All questions were answered. The patient knows to call the clinic with any problems, questions or concerns. The total time spent in the appointment was 20 minutes encounter with patients including review of chart and various tests results, discussions about plan of care and coordination of care plan   Heath Lark, MD 05/04/2019 2:20 PM  INTERVAL HISTORY: Please see below for problem oriented charting. He returns with his son for further follow-up He is doing well He is eating better and gaining weight He continues to have mild loose stool 2-3 times a day but he is able to hydrate himself adequately No nausea No further fever or chills No recent cough, chest pain or shortness of breath He brought with him half of his  medications that he has been taking at home  SUMMARY OF ONCOLOGIC HISTORY: Oncology History Overview Note  Progressed on Elotuzumab, revlimid, Velcade, Daratumumab and Pomalyst   Multiple myeloma in relapse ([place])  12/12/2010 Initial Diagnosis   Multiple myeloma   02/16/2014 Imaging   Skeletal survey showed diffuse osteopenia   03/02/2014 Bone Marrow Biopsy   Accession: [id number] BM biopsy showed only 5 % plasma cells. However, the biopsy was difficult and the bone was fragmented during the procedure. Cytogenetics and FISH study is normal   03/03/2014 Procedure   he has port placement   03/10/2014 - 05/19/2014 Chemotherapy   he received elotuzumab and revlimid   05/20/2014 - 02/25/2016 Chemotherapy   Treatment is switched to maintenance Revlimid only. Treatment is stopped due to progressive disease   11/15/2014 Adverse Reaction   He has mild worsening anemia. Does of Revlimid reduced to 5 mg 21 days on, 7 days off   03/06/2016 -  Chemotherapy   He received Daratumumab and Velcade. Velcade is stopped on 07/24/16   03/06/2016 - 02/15/2019 Chemotherapy   The patient had daratumumab with Pomalyst for chemotherapy treatment.     08/14/2016 Miscellaneous   Pomalyst is added along with Daratumumab   10/17/2016 Surgery   EGD was performed for coffee-ground emesis. Multiple linear superficial ulcers were noted in the esophagus from 25-35 cm from insertion. Severe esophagitis was noted from 30-35 cm from insertion. Multiple biopsies were obtained. A small hiatal hernia was noted. The gastric cavity contained coffee-ground fluid, after suctioning and lavage, no obvious gastric lesions/erosion/ulceration/erythema was noted. Biopsies were taken from the  antrum to rule out H. Pylori. Duodenum bulb appeared unremarkable. A small diverticulum was noted in second portion of the duodenum.     12/06/2016 Imaging   Ct scan abdomen No acute findings.  Mildly enlarged prostate, and probable chronic  bladder outlet obstruction.  Stable small hiatal hernia.  Colonic diverticulosis, without radiographic evidence of diverticulitis.     REVIEW OF SYSTEMS:   Constitutional: Denies fevers, chills or abnormal weight loss Eyes: Denies blurriness of vision Ears, nose, mouth, throat, and face: Denies mucositis or sore throat Respiratory: Denies cough, dyspnea or wheezes Cardiovascular: Denies palpitation, chest discomfort or lower extremity swelling Skin: Denies abnormal skin rashes Lymphatics: Denies new lymphadenopathy or easy bruising Neurological:Denies numbness, tingling or new weaknesses Behavioral/Psych: Mood is stable, no new changes  All other systems were reviewed with the patient and are negative.  I have reviewed the past medical history, past surgical history, social history and family history with the patient and they are unchanged from previous note.  ALLERGIES:  has No Known Allergies.  MEDICATIONS:  Current Outpatient Medications  Medication Sig Dispense Refill  . acyclovir (ZOVIRAX) 400 MG tablet Take 1 tablet (400 mg total) by mouth daily. 60 tablet 11  . allopurinol (ZYLOPRIM) 100 MG tablet Take 100 mg by mouth 2 (two) times daily.     Marland Kitchen amLODipine (NORVASC) 10 MG tablet Take 10 mg by mouth daily.  1  . aspirin EC 81 MG tablet Take 81 mg by mouth daily.    Marland Kitchen atorvastatin (LIPITOR) 20 MG tablet Take 20 mg by mouth at bedtime.     . brimonidine (ALPHAGAN) 0.2 % ophthalmic solution Place 1 drop into the right eye 3 (three) times daily.     . calcium carbonate (TUMS - DOSED IN MG ELEMENTAL CALCIUM) 500 MG chewable tablet Chew 1 tablet by mouth daily as needed for indigestion or heartburn.    . calcium-vitamin D (OSCAL WITH D) 500-200 MG-UNIT tablet Take 2 tablets by mouth 3 (three) times daily.    Marland Kitchen dexamethasone (DECADRON) 4 MG tablet Take 5 tabs on days 1 and 3 every week, take with food, preferably in the morning 40 tablet 11  . dorzolamide-timolol (COSOPT) 22.3-6.8  MG/ML ophthalmic solution Place 1 drop into the right eye 2 (two) times daily.     Arna Medici 75 MCG tablet Take 75 mcg by mouth daily before breakfast.    . fluorometholone (FML) 0.1 % ophthalmic suspension Place 1 drop into the right eye daily.     Marland Kitchen latanoprost (XALATAN) 0.005 % ophthalmic solution Place 1 drop into the right eye at bedtime.    . lidocaine-prilocaine (EMLA) cream Apply to affected area once 30 g 3  . magnesium oxide (MAG-OX) 400 (241.3 Mg) MG tablet Take 1 tablet (400 mg total) by mouth daily. 30 tablet 11  . ondansetron (ZOFRAN) 8 MG tablet Take 1 tablet (8 mg total) by mouth every 8 (eight) hours as needed (Nausea or vomiting). 30 tablet 1  . pantoprazole (PROTONIX) 40 MG tablet TAKE 1 TABLET BY MOUTH EVERY DAY 90 tablet 5  . prochlorperazine (COMPAZINE) 10 MG tablet Take 1 tablet (10 mg total) by mouth every 6 (six) hours as needed for nausea or vomiting. 30 tablet 1   No current facility-administered medications for this visit.    PHYSICAL EXAMINATION: ECOG PERFORMANCE STATUS: 2 - Symptomatic, <50% confined to bed  Vitals:   05/04/19 1118  BP: 128/65  Pulse: 62  Resp: 18  Temp: 99.1 F (37.3  C)  SpO2: 100%   Filed Weights   05/04/19 1118  Weight: 146 lb 9.6 oz (66.5 kg)    GENERAL:alert, no distress and comfortable SKIN: skin color, texture, turgor are normal, no rashes or significant lesions EYES: normal, Conjunctiva are pink and non-injected, sclera clear OROPHARYNX:no exudate, no erythema and lips, buccal mucosa, and tongue normal  NECK: supple, thyroid normal size, non-tender, without nodularity LYMPH:  no palpable lymphadenopathy in the cervical, axillary or inguinal LUNGS: clear to auscultation and percussion with normal breathing effort HEART: regular rate & rhythm and no murmurs and no lower extremity edema ABDOMEN:abdomen soft, non-tender and normal bowel sounds Musculoskeletal:no cyanosis of digits and no clubbing  NEURO: alert & oriented x 3  with fluent speech, no focal motor/sensory deficits  LABORATORY DATA:  I have reviewed the data as listed    Component Value Date/Time   NA 136 05/04/2019 1050   NA 140 01/09/2017 0913   K 3.5 05/04/2019 1050   K 3.7 01/09/2017 0913   CL 107 05/04/2019 1050   CL 107 06/29/2012 1131   CO2 21 (L) 05/04/2019 1050   CO2 21 (L) 01/09/2017 0913   GLUCOSE 119 (H) 05/04/2019 1050   GLUCOSE 80 01/09/2017 0913   GLUCOSE 143 (H) 06/29/2012 1131   BUN 14 05/04/2019 1050   BUN 9.8 01/09/2017 0913   CREATININE 1.61 (H) 05/04/2019 1050   CREATININE 1.85 (H) 07/06/2018 0935   CREATININE 1.5 (H) 01/09/2017 0913   CALCIUM 8.7 (L) 05/04/2019 1050   CALCIUM 7.3 (L) 04/22/2019 0435   CALCIUM 7.5 (L) 01/09/2017 0913   PROT 6.8 05/04/2019 1050   PROT 5.7 (L) 01/09/2017 0913   ALBUMIN 2.9 (L) 05/04/2019 1050   ALBUMIN 2.9 (L) 01/09/2017 0913   AST 15 05/04/2019 1050   AST 14 (L) 07/06/2018 0935   AST 12 01/09/2017 0913   ALT 13 05/04/2019 1050   ALT 11 07/06/2018 0935   ALT 10 01/09/2017 0913   ALKPHOS 70 05/04/2019 1050   ALKPHOS 55 01/09/2017 0913   BILITOT 0.5 05/04/2019 1050   BILITOT 0.5 07/06/2018 0935   BILITOT 0.44 01/09/2017 0913   GFRNONAA 41 (L) 05/04/2019 1050   GFRNONAA 35 (L) 07/06/2018 0935   GFRAA 47 (L) 05/04/2019 1050   GFRAA 40 (L) 07/06/2018 0935    No results found for: SPEP, UPEP  Lab Results  Component Value Date   WBC 7.0 05/04/2019   NEUTROABS 3.8 05/04/2019   HGB 8.5 (L) 05/04/2019   HCT 26.3 (L) 05/04/2019   MCV 91.0 05/04/2019   PLT 92 (L) 05/04/2019      Chemistry      Component Value Date/Time   NA 136 05/04/2019 1050   NA 140 01/09/2017 0913   K 3.5 05/04/2019 1050   K 3.7 01/09/2017 0913   CL 107 05/04/2019 1050   CL 107 06/29/2012 1131   CO2 21 (L) 05/04/2019 1050   CO2 21 (L) 01/09/2017 0913   BUN 14 05/04/2019 1050   BUN 9.8 01/09/2017 0913   CREATININE 1.61 (H) 05/04/2019 1050   CREATININE 1.85 (H) 07/06/2018 0935   CREATININE 1.5  (H) 01/09/2017 0913      Component Value Date/Time   CALCIUM 8.7 (L) 05/04/2019 1050   CALCIUM 7.3 (L) 04/22/2019 0435   CALCIUM 7.5 (L) 01/09/2017 0913   ALKPHOS 70 05/04/2019 1050   ALKPHOS 55 01/09/2017 0913   AST 15 05/04/2019 1050   AST 14 (L) 07/06/2018 0935   AST 12  01/09/2017 0913   ALT 13 05/04/2019 1050   ALT 11 07/06/2018 0935   ALT 10 01/09/2017 0913   BILITOT 0.5 05/04/2019 1050   BILITOT 0.5 07/06/2018 0935   BILITOT 0.44 01/09/2017 0913       RADIOGRAPHIC STUDIES: I have personally reviewed the radiological images as listed and agreed with the findings in the report. DG Chest Port 1 View  Result Date: 04/18/2019 CLINICAL DATA:  Weakness, vomiting EXAM: PORTABLE CHEST 1 VIEW COMPARISON:  03/19/2018 FINDINGS: Mild patchy opacity in the right mid lung and bilateral lower lobes, suspicious for multifocal pneumonia. No pleural effusion or pneumothorax. The heart is normal in size. Right chest power port terminates at the cavoatrial junction. IMPRESSION: Multifocal patchy opacities, suspicious for pneumonia. Electronically Signed   By: Julian Hy M.D.   On: 04/18/2019 22:04

## 2019-05-07 ENCOUNTER — Telehealth: Payer: Self-pay

## 2019-05-07 NOTE — Telephone Encounter (Signed)
Called and given below message to wife and patient. They verbalized understanding.

## 2019-05-07 NOTE — Telephone Encounter (Signed)
I verify with his son during the appt that he has enough of both before I see him back next week I am not refilling anything until I see him next week Please remind her to bring all his pills

## 2019-05-07 NOTE — Telephone Encounter (Signed)
He and his wife called about Dexamethasone Rx and [name]. He needs a refill on Decadon to [facility]. He also needs a refill on [name].  Clarifying Rx's.

## 2019-05-10 ENCOUNTER — Telehealth: Payer: Self-pay

## 2019-05-10 DIAGNOSIS — C9001 Multiple myeloma in remission: Secondary | ICD-10-CM | POA: Diagnosis not present

## 2019-05-10 DIAGNOSIS — N189 Chronic kidney disease, unspecified: Secondary | ICD-10-CM | POA: Diagnosis not present

## 2019-05-10 DIAGNOSIS — R413 Other amnesia: Secondary | ICD-10-CM | POA: Diagnosis not present

## 2019-05-10 DIAGNOSIS — E039 Hypothyroidism, unspecified: Secondary | ICD-10-CM | POA: Diagnosis not present

## 2019-05-10 DIAGNOSIS — K2900 Acute gastritis without bleeding: Secondary | ICD-10-CM | POA: Diagnosis not present

## 2019-05-10 DIAGNOSIS — D649 Anemia, unspecified: Secondary | ICD-10-CM | POA: Diagnosis not present

## 2019-05-10 NOTE — Telephone Encounter (Signed)
Wife called and left a message to call.  Called back. No answer or voicemail. Will call back later.

## 2019-05-10 NOTE — Telephone Encounter (Signed)
Called back. He found his medication and will bring it tomorrow to appt.

## 2019-05-11 ENCOUNTER — Encounter: Payer: Self-pay | Admitting: Hematology and Oncology

## 2019-05-11 ENCOUNTER — Telehealth: Payer: Self-pay

## 2019-05-11 ENCOUNTER — Other Ambulatory Visit: Payer: Self-pay | Admitting: Hematology and Oncology

## 2019-05-11 ENCOUNTER — Inpatient Hospital Stay: Payer: Medicare Other

## 2019-05-11 ENCOUNTER — Other Ambulatory Visit: Payer: Self-pay | Admitting: Pharmacist

## 2019-05-11 ENCOUNTER — Other Ambulatory Visit: Payer: Self-pay

## 2019-05-11 ENCOUNTER — Inpatient Hospital Stay: Payer: Medicare Other | Attending: Hematology and Oncology | Admitting: Hematology and Oncology

## 2019-05-11 VITALS — BP 116/68 | HR 74 | Temp 98.1°F | Resp 18 | Ht 71.0 in

## 2019-05-11 DIAGNOSIS — C9002 Multiple myeloma in relapse: Secondary | ICD-10-CM | POA: Insufficient documentation

## 2019-05-11 DIAGNOSIS — K449 Diaphragmatic hernia without obstruction or gangrene: Secondary | ICD-10-CM | POA: Insufficient documentation

## 2019-05-11 DIAGNOSIS — K529 Noninfective gastroenteritis and colitis, unspecified: Secondary | ICD-10-CM | POA: Insufficient documentation

## 2019-05-11 DIAGNOSIS — E860 Dehydration: Secondary | ICD-10-CM | POA: Insufficient documentation

## 2019-05-11 DIAGNOSIS — Z79899 Other long term (current) drug therapy: Secondary | ICD-10-CM | POA: Diagnosis not present

## 2019-05-11 DIAGNOSIS — K5730 Diverticulosis of large intestine without perforation or abscess without bleeding: Secondary | ICD-10-CM | POA: Insufficient documentation

## 2019-05-11 DIAGNOSIS — M8580 Other specified disorders of bone density and structure, unspecified site: Secondary | ICD-10-CM | POA: Diagnosis not present

## 2019-05-11 DIAGNOSIS — N183 Chronic kidney disease, stage 3 unspecified: Secondary | ICD-10-CM

## 2019-05-11 DIAGNOSIS — D61818 Other pancytopenia: Secondary | ICD-10-CM | POA: Diagnosis not present

## 2019-05-11 DIAGNOSIS — Z7952 Long term (current) use of systemic steroids: Secondary | ICD-10-CM | POA: Insufficient documentation

## 2019-05-11 DIAGNOSIS — Z9221 Personal history of antineoplastic chemotherapy: Secondary | ICD-10-CM | POA: Insufficient documentation

## 2019-05-11 DIAGNOSIS — E876 Hypokalemia: Secondary | ICD-10-CM | POA: Insufficient documentation

## 2019-05-11 LAB — CMP (CANCER CENTER ONLY)
ALT: 9 U/L (ref 0–44)
AST: 9 U/L — ABNORMAL LOW (ref 15–41)
Albumin: 3.4 g/dL — ABNORMAL LOW (ref 3.5–5.0)
Alkaline Phosphatase: 63 U/L (ref 38–126)
Anion gap: 10 (ref 5–15)
BUN: 23 mg/dL (ref 8–23)
CO2: 21 mmol/L — ABNORMAL LOW (ref 22–32)
Calcium: 8.6 mg/dL — ABNORMAL LOW (ref 8.9–10.3)
Chloride: 102 mmol/L (ref 98–111)
Creatinine: 2.26 mg/dL — ABNORMAL HIGH (ref 0.61–1.24)
GFR, Est AFR Am: 31 mL/min — ABNORMAL LOW (ref >60)
GFR, Estimated: 27 mL/min — ABNORMAL LOW (ref >60)
Glucose, Bld: 97 mg/dL (ref 70–99)
Potassium: 3 mmol/L — CL (ref 3.5–5.1)
Sodium: 133 mmol/L — ABNORMAL LOW (ref 135–145)
Total Bilirubin: 0.7 mg/dL (ref 0.3–1.2)
Total Protein: 7.2 g/dL (ref 6.5–8.1)

## 2019-05-11 LAB — CBC WITH DIFFERENTIAL (CANCER CENTER ONLY)
Abs Immature Granulocytes: 0.04 10*3/uL (ref 0.00–0.07)
Basophils Absolute: 0 10*3/uL (ref 0.0–0.1)
Basophils Relative: 0 %
Eosinophils Absolute: 0 10*3/uL (ref 0.0–0.5)
Eosinophils Relative: 0 %
HCT: 27.5 % — ABNORMAL LOW (ref 39.0–52.0)
Hemoglobin: 9.5 g/dL — ABNORMAL LOW (ref 13.0–17.0)
Immature Granulocytes: 1 %
Lymphocytes Relative: 20 %
Lymphs Abs: 1.4 10*3/uL (ref 0.7–4.0)
MCH: 29 pg (ref 26.0–34.0)
MCHC: 34.5 g/dL (ref 30.0–36.0)
MCV: 83.8 fL (ref 80.0–100.0)
Monocytes Absolute: 0.3 10*3/uL (ref 0.1–1.0)
Monocytes Relative: 4 %
Neutro Abs: 5.1 10*3/uL (ref 1.7–7.7)
Neutrophils Relative %: 75 %
Platelet Count: 118 10*3/uL — ABNORMAL LOW (ref 150–400)
RBC: 3.28 MIL/uL — ABNORMAL LOW (ref 4.22–5.81)
RDW: 22.1 % — ABNORMAL HIGH (ref 11.5–15.5)
WBC Count: 6.8 10*3/uL (ref 4.0–10.5)
nRBC: 0.4 % — ABNORMAL HIGH (ref 0.0–0.2)

## 2019-05-11 LAB — MAGNESIUM: Magnesium: 1.6 mg/dL — ABNORMAL LOW (ref 1.7–2.4)

## 2019-05-11 LAB — SAMPLE TO BLOOD BANK

## 2019-05-11 MED ORDER — SELINEXOR (80 MG TWICE WEEKLY) 20 MG PO TBPK: ORAL_TABLET | ORAL | 11 refills | Status: DC

## 2019-05-11 MED ORDER — DEXAMETHASONE 4 MG PO TABS: ORAL_TABLET | ORAL | 11 refills | Status: DC

## 2019-05-11 NOTE — Assessment & Plan Note (Signed)
He felt that his diarrhea is controlled but yet he has lost a lot of weight He is instructed to take Imodium as needed

## 2019-05-11 NOTE — Assessment & Plan Note (Signed)
Overall, he continues to improve clinically He continues to exhibit poor understanding of management of side-effects of treatment I recommend he continues weekly follow-up and to bring his son to each appointment I provided written instructions; specifically, he will start dexamethasone only at 12 mg today and Thursday and [name] at 60 mg twice a day today and Thursday Then plan is to gradually increase back to full dose by next week I will see him back next week for further follow-up

## 2019-05-11 NOTE — Telephone Encounter (Signed)
Wife called and left a message to call Calvin Mccormick.  Called back. He wanted to clarify [name] Rx. Told him Rx sent to [facility] today.

## 2019-05-11 NOTE — Progress Notes (Signed)
[facility] OFFICE PROGRESS NOTE  Patient Care Team: Lucianne Lei, MD as PCP - General (Family Medicine) Heath Lark, MD as Consulting Physician (Hematology and Oncology) Beverely Pace, LCSW as Social Worker (General Practice)  ASSESSMENT & PLAN:  Multiple myeloma in relapse ([place]) Overall, he continues to improve clinically He continues to exhibit poor understanding of management of side-effects of treatment I recommend he continues weekly follow-up and to bring his son to each appointment I provided written instructions; specifically, he will start dexamethasone only at 12 mg today and Thursday and [name] at 60 mg twice a day today and Thursday Then plan is to gradually increase back to full dose by next week I will see him back next week for further follow-up  Pancytopenia, acquired ([place]) Overall, he is improving since the last time I saw him He is not symptomatic He does not need transfusion support  Hypomagnesemia His magnesium level is still low He will continue 1 oral magnesium oxide daily We will continue to monitor carefully  Hypokalemia He has hypokalemia secondary to GI loss I recommend close monitoring and potassium rich diet  Chronic renal insufficiency, stage III (moderate) His renal function is slightly elevated due to dehydration I continue to encourage him to increase oral intake as tolerated  Chronic diarrhea He felt that his diarrhea is controlled but yet he has lost a lot of weight He is instructed to take Imodium as needed   Orders Placed This Encounter  Procedures  . CMP ([place] only)    Standing Status:   Future    Number of Occurrences:   1    Standing Expiration Date:   06/11/2019  . Comprehensive metabolic panel    Standing Status:   Standing    Number of Occurrences:   33    Standing Expiration Date:   05/10/2020  . CBC with Differential/Platelet    Standing Status:   Standing    Number of Occurrences:   33   Standing Expiration Date:   05/10/2020    All questions were answered. The patient knows to call the clinic with any problems, questions or concerns. The total time spent in the appointment was 30 minutes encounter with patients including review of chart and various tests results, discussions about plan of care and coordination of care plan   Heath Lark, MD 05/11/2019 3:57 PM  INTERVAL HISTORY: Please see below for problem oriented charting. He returns with his son for follow-up He has intermittent diarrhea this past week but did not take imodium The patient denies any recent signs or symptoms of bleeding such as spontaneous epistaxis, hematuria or hematochezia. No recent fevers, chills, or cough Appetite is fair No nausea He brought in all his prescriptions today which I have reviewed  SUMMARY OF ONCOLOGIC HISTORY: Oncology History Overview Note  Progressed on Elotuzumab, revlimid, Velcade, Daratumumab and Pomalyst   Multiple myeloma in relapse ([place])  12/12/2010 Initial Diagnosis   Multiple myeloma   02/16/2014 Imaging   Skeletal survey showed diffuse osteopenia   03/02/2014 Bone Marrow Biopsy   Accession: [id number] BM biopsy showed only 5 % plasma cells. However, the biopsy was difficult and the bone was fragmented during the procedure. Cytogenetics and FISH study is normal   03/03/2014 Procedure   he has port placement   03/10/2014 - 05/19/2014 Chemotherapy   he received elotuzumab and revlimid   05/20/2014 - 02/25/2016 Chemotherapy   Treatment is switched to maintenance Revlimid only. Treatment is stopped due to  progressive disease   11/15/2014 Adverse Reaction   He has mild worsening anemia. Does of Revlimid reduced to 5 mg 21 days on, 7 days off   03/06/2016 -  Chemotherapy   He received Daratumumab and Velcade. Velcade is stopped on 07/24/16   03/06/2016 - 02/15/2019 Chemotherapy   The patient had daratumumab with Pomalyst for chemotherapy treatment.     08/14/2016 Miscellaneous    Pomalyst is added along with Daratumumab   10/17/2016 Surgery   EGD was performed for coffee-ground emesis. Multiple linear superficial ulcers were noted in the esophagus from 25-35 cm from insertion. Severe esophagitis was noted from 30-35 cm from insertion. Multiple biopsies were obtained. A small hiatal hernia was noted. The gastric cavity contained coffee-ground fluid, after suctioning and lavage, no obvious gastric lesions/erosion/ulceration/erythema was noted. Biopsies were taken from the antrum to rule out H. Pylori. Duodenum bulb appeared unremarkable. A small diverticulum was noted in second portion of the duodenum.     12/06/2016 Imaging   Ct scan abdomen No acute findings.  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  Stable small hiatal hernia.  Colonic diverticulosis, without radiographic evidence of diverticulitis.     REVIEW OF SYSTEMS:   Constitutional: Denies fevers, chills or abnormal weight loss Eyes: Denies blurriness of vision Ears, nose, mouth, throat, and face: Denies mucositis or sore throat Respiratory: Denies cough, dyspnea or wheezes Cardiovascular: Denies palpitation, chest discomfort or lower extremity swelling Skin: Denies abnormal skin rashes Lymphatics: Denies new lymphadenopathy or easy bruising Neurological:Denies numbness, tingling or new weaknesses Behavioral/Psych: Mood is stable, no new changes  All other systems were reviewed with the patient and are negative.  I have reviewed the past medical history, past surgical history, social history and family history with the patient and they are unchanged from previous note.  ALLERGIES:  has No Known Allergies.  MEDICATIONS:  Current Outpatient Medications  Medication Sig Dispense Refill  . acyclovir (ZOVIRAX) 400 MG tablet Take 1 tablet (400 mg total) by mouth daily. 60 tablet 11  . allopurinol (ZYLOPRIM) 100 MG tablet Take 100 mg by mouth 2 (two)  times daily.     Marland Kitchen amLODipine (NORVASC) 10 MG tablet Take 10 mg by mouth daily.  1  . aspirin EC 81 MG tablet Take 81 mg by mouth daily.    Marland Kitchen atorvastatin (LIPITOR) 20 MG tablet Take 20 mg by mouth at bedtime.     . brimonidine (ALPHAGAN) 0.2 % ophthalmic solution Place 1 drop into the right eye 3 (three) times daily.     . calcium carbonate (TUMS - DOSED IN MG ELEMENTAL CALCIUM) 500 MG chewable tablet Chew 1 tablet by mouth daily as needed for indigestion or heartburn.    . calcium-vitamin D (OSCAL WITH D) 500-200 MG-UNIT tablet Take 2 tablets by mouth 3 (three) times daily.    Marland Kitchen dexamethasone (DECADRON) 4 MG tablet Take 5 tabs on days 1 and 3 every week, take with food, preferably in the morning 40 tablet 11  . dorzolamide-timolol (COSOPT) 22.3-6.8 MG/ML ophthalmic solution Place 1 drop into the right eye 2 (two) times daily.     Arna Medici 75 MCG tablet Take 75 mcg by mouth daily before breakfast.    . fluorometholone (FML) 0.1 % ophthalmic suspension Place 1 drop into the right eye daily.     Marland Kitchen latanoprost (XALATAN) 0.005 % ophthalmic solution Place 1 drop into the right eye at bedtime.    . lidocaine-prilocaine (EMLA) cream Apply to affected area  once 30 g 3  . magnesium oxide (MAG-OX) 400 (241.3 Mg) MG tablet Take 1 tablet (400 mg total) by mouth daily. 30 tablet 11  . ondansetron (ZOFRAN) 8 MG tablet Take 1 tablet (8 mg total) by mouth every 8 (eight) hours as needed (Nausea or vomiting). 30 tablet 1  . pantoprazole (PROTONIX) 40 MG tablet TAKE 1 TABLET BY MOUTH EVERY DAY 90 tablet 5  . prochlorperazine (COMPAZINE) 10 MG tablet Take 1 tablet (10 mg total) by mouth every 6 (six) hours as needed for nausea or vomiting. 30 tablet 1  . selinexor 20 MG TBPK Take 80 mg twice weekly on Tuesdays and Thursdays 32 each 11   No current facility-administered medications for this visit.    PHYSICAL EXAMINATION: ECOG PERFORMANCE STATUS: 2 - Symptomatic, <50% confined to bed  Vitals:   05/11/19  1100  BP: 116/68  Pulse: 74  Resp: 18  Temp: 98.1 F (36.7 C)  SpO2: 100%   There were no vitals filed for this visit.  GENERAL:alert, no distress and comfortable. He looks thin and frail  NEURO: alert & oriented x 3 with fluent speech, no focal motor/sensory deficits  LABORATORY DATA:  I have reviewed the data as listed    Component Value Date/Time   NA 133 (L) 05/11/2019 1025   NA 140 01/09/2017 0913   K 3.0 (LL) 05/11/2019 1025   K 3.7 01/09/2017 0913   CL 102 05/11/2019 1025   CL 107 06/29/2012 1131   CO2 21 (L) 05/11/2019 1025   CO2 21 (L) 01/09/2017 0913   GLUCOSE 97 05/11/2019 1025   GLUCOSE 80 01/09/2017 0913   GLUCOSE 143 (H) 06/29/2012 1131   BUN 23 05/11/2019 1025   BUN 9.8 01/09/2017 0913   CREATININE 2.26 (H) 05/11/2019 1025   CREATININE 1.5 (H) 01/09/2017 0913   CALCIUM 8.6 (L) 05/11/2019 1025   CALCIUM 7.3 (L) 04/22/2019 0435   CALCIUM 7.5 (L) 01/09/2017 0913   PROT 7.2 05/11/2019 1025   PROT 5.7 (L) 01/09/2017 0913   ALBUMIN 3.4 (L) 05/11/2019 1025   ALBUMIN 2.9 (L) 01/09/2017 0913   AST 9 (L) 05/11/2019 1025   AST 12 01/09/2017 0913   ALT 9 05/11/2019 1025   ALT 10 01/09/2017 0913   ALKPHOS 63 05/11/2019 1025   ALKPHOS 55 01/09/2017 0913   BILITOT 0.7 05/11/2019 1025   BILITOT 0.44 01/09/2017 0913   GFRNONAA 27 (L) 05/11/2019 1025   GFRAA 31 (L) 05/11/2019 1025    No results found for: SPEP, UPEP  Lab Results  Component Value Date   WBC 6.8 05/11/2019   NEUTROABS 5.1 05/11/2019   HGB 9.5 (L) 05/11/2019   HCT 27.5 (L) 05/11/2019   MCV 83.8 05/11/2019   PLT 118 (L) 05/11/2019      Chemistry      Component Value Date/Time   NA 133 (L) 05/11/2019 1025   NA 140 01/09/2017 0913   K 3.0 (LL) 05/11/2019 1025   K 3.7 01/09/2017 0913   CL 102 05/11/2019 1025   CL 107 06/29/2012 1131   CO2 21 (L) 05/11/2019 1025   CO2 21 (L) 01/09/2017 0913   BUN 23 05/11/2019 1025   BUN 9.8 01/09/2017 0913   CREATININE 2.26 (H) 05/11/2019 1025    CREATININE 1.5 (H) 01/09/2017 0913      Component Value Date/Time   CALCIUM 8.6 (L) 05/11/2019 1025   CALCIUM 7.3 (L) 04/22/2019 0435   CALCIUM 7.5 (L) 01/09/2017 0913   ALKPHOS 63  05/11/2019 1025   ALKPHOS 55 01/09/2017 0913   AST 9 (L) 05/11/2019 1025   AST 12 01/09/2017 0913   ALT 9 05/11/2019 1025   ALT 10 01/09/2017 0913   BILITOT 0.7 05/11/2019 1025   BILITOT 0.44 01/09/2017 0913

## 2019-05-11 NOTE — Telephone Encounter (Signed)
Attempted to call. No answer. Will call back later. Potassium is 3. Instruct Mr. Tribe to start a potassium rich diet. Continue Magnesium and drink more fluids.

## 2019-05-11 NOTE — Telephone Encounter (Signed)
Called and given below message to patient and wife. They both verbalized understanding. Unable to get in touch with his son.

## 2019-05-11 NOTE — Assessment & Plan Note (Signed)
His magnesium level is still low He will continue 1 oral magnesium oxide daily We will continue to monitor carefully

## 2019-05-11 NOTE — Assessment & Plan Note (Signed)
Overall, he is improving since the last time I saw him He is not symptomatic He does not need transfusion support

## 2019-05-11 NOTE — Assessment & Plan Note (Signed)
He has hypokalemia secondary to GI loss I recommend close monitoring and potassium rich diet

## 2019-05-11 NOTE — Progress Notes (Signed)
Reported critical potassium 3.0 to Dr. Alvy Bimler.

## 2019-05-11 NOTE — Assessment & Plan Note (Signed)
His renal function is slightly elevated due to dehydration I continue to encourage him to increase oral intake as tolerated

## 2019-05-12 ENCOUNTER — Telehealth: Payer: Self-pay | Admitting: Hematology and Oncology

## 2019-05-12 NOTE — Telephone Encounter (Signed)
Scheduled appts per 5/4 sch msg. Left voicemail with appt date and time.

## 2019-05-13 LAB — KAPPA/LAMBDA LIGHT CHAINS
Kappa free light chain: 175 mg/L — ABNORMAL HIGH (ref 3.3–19.4)
Kappa, lambda light chain ratio: 92.11 — ABNORMAL HIGH (ref 0.26–1.65)
Lambda free light chains: 1.9 mg/L — ABNORMAL LOW (ref 5.7–26.3)

## 2019-05-14 LAB — MULTIPLE MYELOMA PANEL, SERUM
Albumin SerPl Elph-Mcnc: 3.3 g/dL
Albumin/Glob SerPl: 1.1
Alpha 1: 0.2 g/dL
Alpha2 Glob SerPl Elph-Mcnc: 0.8 g/dL
B-Globulin SerPl Elph-Mcnc: 0.8 g/dL
Gamma Glob SerPl Elph-Mcnc: 1.4 g/dL
Globulin, Total: 3.3 g/dL
IgA: 14 mg/dL
IgG (Immunoglobin G), Serum: 1608 mg/dL
IgM (Immunoglobulin M), Srm: <5 mg/dL
M Protein SerPl Elph-Mcnc: 1.2 g/dL — ABNORMAL HIGH
Total Protein ELP: 6.6 g/dL (ref 6.0–8.5)

## 2019-05-18 ENCOUNTER — Telehealth: Payer: Self-pay | Admitting: Hematology and Oncology

## 2019-05-18 ENCOUNTER — Encounter: Payer: Self-pay | Admitting: Hematology and Oncology

## 2019-05-18 ENCOUNTER — Inpatient Hospital Stay (HOSPITAL_BASED_OUTPATIENT_CLINIC_OR_DEPARTMENT_OTHER): Payer: Medicare Other | Admitting: Hematology and Oncology

## 2019-05-18 ENCOUNTER — Inpatient Hospital Stay: Payer: Medicare Other

## 2019-05-18 ENCOUNTER — Other Ambulatory Visit: Payer: Self-pay

## 2019-05-18 ENCOUNTER — Encounter (HOSPITAL_COMMUNITY): Payer: Medicare Other | Admitting: Dentistry

## 2019-05-18 DIAGNOSIS — M8580 Other specified disorders of bone density and structure, unspecified site: Secondary | ICD-10-CM | POA: Diagnosis not present

## 2019-05-18 DIAGNOSIS — C9002 Multiple myeloma in relapse: Secondary | ICD-10-CM | POA: Diagnosis not present

## 2019-05-18 DIAGNOSIS — K5730 Diverticulosis of large intestine without perforation or abscess without bleeding: Secondary | ICD-10-CM | POA: Diagnosis not present

## 2019-05-18 DIAGNOSIS — N183 Chronic kidney disease, stage 3 unspecified: Secondary | ICD-10-CM

## 2019-05-18 DIAGNOSIS — K449 Diaphragmatic hernia without obstruction or gangrene: Secondary | ICD-10-CM | POA: Diagnosis not present

## 2019-05-18 DIAGNOSIS — E860 Dehydration: Secondary | ICD-10-CM | POA: Diagnosis not present

## 2019-05-18 DIAGNOSIS — K529 Noninfective gastroenteritis and colitis, unspecified: Secondary | ICD-10-CM | POA: Diagnosis not present

## 2019-05-18 DIAGNOSIS — D61818 Other pancytopenia: Secondary | ICD-10-CM

## 2019-05-18 DIAGNOSIS — Z9221 Personal history of antineoplastic chemotherapy: Secondary | ICD-10-CM | POA: Diagnosis not present

## 2019-05-18 DIAGNOSIS — E876 Hypokalemia: Secondary | ICD-10-CM | POA: Diagnosis not present

## 2019-05-18 DIAGNOSIS — Z79899 Other long term (current) drug therapy: Secondary | ICD-10-CM | POA: Diagnosis not present

## 2019-05-18 DIAGNOSIS — Z7952 Long term (current) use of systemic steroids: Secondary | ICD-10-CM | POA: Diagnosis not present

## 2019-05-18 LAB — CBC WITH DIFFERENTIAL/PLATELET
Abs Immature Granulocytes: 0.09 10*3/uL — ABNORMAL HIGH (ref 0.00–0.07)
Basophils Absolute: 0 10*3/uL (ref 0.0–0.1)
Basophils Relative: 0 %
Eosinophils Absolute: 0 10*3/uL (ref 0.0–0.5)
Eosinophils Relative: 0 %
HCT: 30.2 % — ABNORMAL LOW (ref 39.0–52.0)
Hemoglobin: 10.2 g/dL — ABNORMAL LOW (ref 13.0–17.0)
Immature Granulocytes: 1 %
Lymphocytes Relative: 15 %
Lymphs Abs: 1.6 10*3/uL (ref 0.7–4.0)
MCH: 29.1 pg (ref 26.0–34.0)
MCHC: 33.8 g/dL (ref 30.0–36.0)
MCV: 86 fL (ref 80.0–100.0)
Monocytes Absolute: 0.7 10*3/uL (ref 0.1–1.0)
Monocytes Relative: 7 %
Neutro Abs: 8 10*3/uL — ABNORMAL HIGH (ref 1.7–7.7)
Neutrophils Relative %: 77 %
Platelets: 111 10*3/uL — ABNORMAL LOW (ref 150–400)
RBC: 3.51 MIL/uL — ABNORMAL LOW (ref 4.22–5.81)
RDW: 22.6 % — ABNORMAL HIGH (ref 11.5–15.5)
WBC: 10.4 10*3/uL (ref 4.0–10.5)
nRBC: 0 % (ref 0.0–0.2)

## 2019-05-18 LAB — COMPREHENSIVE METABOLIC PANEL
ALT: 10 U/L (ref 0–44)
AST: 10 U/L — ABNORMAL LOW (ref 15–41)
Albumin: 3.6 g/dL (ref 3.5–5.0)
Alkaline Phosphatase: 65 U/L (ref 38–126)
Anion gap: 11 (ref 5–15)
BUN: 32 mg/dL — ABNORMAL HIGH (ref 8–23)
CO2: 23 mmol/L (ref 22–32)
Calcium: 9.3 mg/dL (ref 8.9–10.3)
Chloride: 99 mmol/L (ref 98–111)
Creatinine, Ser: 2.09 mg/dL — ABNORMAL HIGH (ref 0.61–1.24)
GFR calc Af Amer: 35 mL/min — ABNORMAL LOW (ref >60)
GFR calc non Af Amer: 30 mL/min — ABNORMAL LOW (ref >60)
Glucose, Bld: 102 mg/dL — ABNORMAL HIGH (ref 70–99)
Potassium: 3.9 mmol/L (ref 3.5–5.1)
Sodium: 133 mmol/L — ABNORMAL LOW (ref 135–145)
Total Bilirubin: 0.9 mg/dL (ref 0.3–1.2)
Total Protein: 7 g/dL (ref 6.5–8.1)

## 2019-05-18 LAB — SAMPLE TO BLOOD BANK

## 2019-05-18 LAB — MAGNESIUM: Magnesium: 1.7 mg/dL (ref 1.7–2.4)

## 2019-05-18 MED ORDER — DEXAMETHASONE 4 MG PO TABS: ORAL_TABLET | ORAL | 11 refills | Status: AC

## 2019-05-18 NOTE — Telephone Encounter (Signed)
Spoke with pt. Pt aware of appts on 5/18. Per 5/11 sch msg.

## 2019-05-18 NOTE — Assessment & Plan Note (Signed)
Overall, he continues to improve clinically He continues to exhibit poor understanding of management of side-effects of treatment I recommend he continues weekly follow-up and to bring his son to each appointment I provided written instructions; specifically, he will increase dexamethasone to 16 today and Thursday and [name] at 80 mg twice a day today and Thursday I will see him back next week for further follow-up

## 2019-05-18 NOTE — Assessment & Plan Note (Signed)
His renal function is slightly elevated due to dehydration I continue to encourage him to increase oral intake as tolerated

## 2019-05-18 NOTE — Assessment & Plan Note (Signed)
Overall, he is improving since the last time I saw him He is not symptomatic He does not need transfusion support

## 2019-05-18 NOTE — Progress Notes (Signed)
[facility] OFFICE PROGRESS NOTE  Patient Care Team: Lucianne Lei, MD as PCP - General (Family Medicine) Heath Lark, MD as Consulting Physician (Hematology and Oncology) Beverely Pace, LCSW as Social Worker (General Practice)  ASSESSMENT & PLAN:  Multiple myeloma in relapse ([place]) Overall, he continues to improve clinically He continues to exhibit poor understanding of management of side-effects of treatment I recommend he continues weekly follow-up and to bring his son to each appointment I provided written instructions; specifically, he will increase dexamethasone to 16 today and Thursday and [name] at 80 mg twice a day today and Thursday I will see him back next week for further follow-up  Pancytopenia, acquired ([place]) Overall, he is improving since the last time I saw him He is not symptomatic He does not need transfusion support  Chronic renal insufficiency, stage III (moderate) His renal function is slightly elevated due to dehydration I continue to encourage him to increase oral intake as tolerated  Chronic diarrhea He felt that his diarrhea is controlled but yet he has lost a lot of weight He is instructed to take Imodium as needed   No orders of the defined types were placed in this encounter.   All questions were answered. The patient knows to call the clinic with any problems, questions or concerns. The total time spent in the appointment was 20 minutes encounter with patients including review of chart and various tests results, discussions about plan of care and coordination of care plan   Heath Lark, MD 05/18/2019 4:12 PM  INTERVAL HISTORY: Please see below for problem oriented charting. He returns with his son for further follow-up He has lost a lot of weight since last time I saw him He felt that he is eating enough He continues to have intermittent diarrhea but he is not taking Imodium No recent nausea The patient denies any recent  signs or symptoms of bleeding such as spontaneous epistaxis, hematuria or hematochezia.   SUMMARY OF ONCOLOGIC HISTORY: Oncology History Overview Note  Progressed on Elotuzumab, revlimid, Velcade, Daratumumab and Pomalyst   Multiple myeloma in relapse ([place])  12/12/2010 Initial Diagnosis   Multiple myeloma   02/16/2014 Imaging   Skeletal survey showed diffuse osteopenia   03/02/2014 Bone Marrow Biopsy   Accession: [id number] BM biopsy showed only 5 % plasma cells. However, the biopsy was difficult and the bone was fragmented during the procedure. Cytogenetics and FISH study is normal   03/03/2014 Procedure   he has port placement   03/10/2014 - 05/19/2014 Chemotherapy   he received elotuzumab and revlimid   05/20/2014 - 02/25/2016 Chemotherapy   Treatment is switched to maintenance Revlimid only. Treatment is stopped due to progressive disease   11/15/2014 Adverse Reaction   He has mild worsening anemia. Does of Revlimid reduced to 5 mg 21 days on, 7 days off   03/06/2016 -  Chemotherapy   He received Daratumumab and Velcade. Velcade is stopped on 07/24/16   03/06/2016 - 02/15/2019 Chemotherapy   The patient had daratumumab with Pomalyst for chemotherapy treatment.     08/14/2016 Miscellaneous   Pomalyst is added along with Daratumumab   10/17/2016 Surgery   EGD was performed for coffee-ground emesis. Multiple linear superficial ulcers were noted in the esophagus from 25-35 cm from insertion. Severe esophagitis was noted from 30-35 cm from insertion. Multiple biopsies were obtained. A small hiatal hernia was noted. The gastric cavity contained coffee-ground fluid, after suctioning and lavage, no obvious gastric lesions/erosion/ulceration/erythema was noted. Biopsies were  taken from the antrum to rule out H. Pylori. Duodenum bulb appeared unremarkable. A small diverticulum was noted in second portion of the duodenum.     12/06/2016 Imaging   Ct scan abdomen No acute  findings.  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  Stable small hiatal hernia.  Colonic diverticulosis, without radiographic evidence of diverticulitis.     REVIEW OF SYSTEMS:   Eyes: Denies blurriness of vision Ears, nose, mouth, throat, and face: Denies mucositis or sore throat Respiratory: Denies cough, dyspnea or wheezes Cardiovascular: Denies palpitation, chest discomfort or lower extremity swelling Skin: Denies abnormal skin rashes Lymphatics: Denies new lymphadenopathy or easy bruising Neurological:Denies numbness, tingling or new weaknesses Behavioral/Psych: Mood is stable, no new changes  All other systems were reviewed with the patient and are negative.  I have reviewed the past medical history, past surgical history, social history and family history with the patient and they are unchanged from previous note.  ALLERGIES:  has No Known Allergies.  MEDICATIONS:  Current Outpatient Medications  Medication Sig Dispense Refill  . acyclovir (ZOVIRAX) 400 MG tablet Take 1 tablet (400 mg total) by mouth daily. 60 tablet 11  . allopurinol (ZYLOPRIM) 100 MG tablet Take 100 mg by mouth 2 (two) times daily.     Marland Kitchen amLODipine (NORVASC) 10 MG tablet Take 10 mg by mouth daily.  1  . aspirin EC 81 MG tablet Take 81 mg by mouth daily.    Marland Kitchen atorvastatin (LIPITOR) 20 MG tablet Take 20 mg by mouth at bedtime.     . brimonidine (ALPHAGAN) 0.2 % ophthalmic solution Place 1 drop into the right eye 3 (three) times daily.     . calcium carbonate (TUMS - DOSED IN MG ELEMENTAL CALCIUM) 500 MG chewable tablet Chew 1 tablet by mouth daily as needed for indigestion or heartburn.    . calcium-vitamin D (OSCAL WITH D) 500-200 MG-UNIT tablet Take 2 tablets by mouth 3 (three) times daily.    Marland Kitchen dexamethasone (DECADRON) 4 MG tablet Take 4 tabs on days 1 and 3 every week, take with food, preferably in the morning 40 tablet 11  . dorzolamide-timolol (COSOPT) 22.3-6.8  MG/ML ophthalmic solution Place 1 drop into the right eye 2 (two) times daily.     Arna Medici 75 MCG tablet Take 75 mcg by mouth daily before breakfast.    . fluorometholone (FML) 0.1 % ophthalmic suspension Place 1 drop into the right eye daily.     Marland Kitchen latanoprost (XALATAN) 0.005 % ophthalmic solution Place 1 drop into the right eye at bedtime.    . lidocaine-prilocaine (EMLA) cream Apply to affected area once 30 g 3  . magnesium oxide (MAG-OX) 400 (241.3 Mg) MG tablet Take 1 tablet (400 mg total) by mouth daily. 30 tablet 11  . ondansetron (ZOFRAN) 8 MG tablet Take 1 tablet (8 mg total) by mouth every 8 (eight) hours as needed (Nausea or vomiting). 30 tablet 1  . pantoprazole (PROTONIX) 40 MG tablet TAKE 1 TABLET BY MOUTH EVERY DAY 90 tablet 5  . prochlorperazine (COMPAZINE) 10 MG tablet Take 1 tablet (10 mg total) by mouth every 6 (six) hours as needed for nausea or vomiting. 30 tablet 1  . selinexor 20 MG TBPK Take 80 mg twice weekly on Tuesdays and Thursdays 32 each 11   No current facility-administered medications for this visit.    PHYSICAL EXAMINATION: ECOG PERFORMANCE STATUS: 2 - Symptomatic, <50% confined to bed  Vitals:   05/18/19 1005  BP: 123/76  Pulse: 83  Resp: 18  Temp: 98.5 F (36.9 C)  SpO2: 100%   Filed Weights   05/18/19 1005  Weight: 131 lb 6.4 oz (59.6 kg)    GENERAL:alert, no distress and comfortable NEURO: alert & oriented x 3 with fluent speech, no focal motor/sensory deficits  LABORATORY DATA:  I have reviewed the data as listed    Component Value Date/Time   NA 133 (L) 05/18/2019 0940   NA 140 01/09/2017 0913   K 3.9 05/18/2019 0940   K 3.7 01/09/2017 0913   CL 99 05/18/2019 0940   CL 107 06/29/2012 1131   CO2 23 05/18/2019 0940   CO2 21 (L) 01/09/2017 0913   GLUCOSE 102 (H) 05/18/2019 0940   GLUCOSE 80 01/09/2017 0913   GLUCOSE 143 (H) 06/29/2012 1131   BUN 32 (H) 05/18/2019 0940   BUN 9.8 01/09/2017 0913   CREATININE 2.09 (H) 05/18/2019  0940   CREATININE 2.26 (H) 05/11/2019 1025   CREATININE 1.5 (H) 01/09/2017 0913   CALCIUM 9.3 05/18/2019 0940   CALCIUM 7.3 (L) 04/22/2019 0435   CALCIUM 7.5 (L) 01/09/2017 0913   PROT 7.0 05/18/2019 0940   PROT 5.7 (L) 01/09/2017 0913   ALBUMIN 3.6 05/18/2019 0940   ALBUMIN 2.9 (L) 01/09/2017 0913   AST 10 (L) 05/18/2019 0940   AST 9 (L) 05/11/2019 1025   AST 12 01/09/2017 0913   ALT 10 05/18/2019 0940   ALT 9 05/11/2019 1025   ALT 10 01/09/2017 0913   ALKPHOS 65 05/18/2019 0940   ALKPHOS 55 01/09/2017 0913   BILITOT 0.9 05/18/2019 0940   BILITOT 0.7 05/11/2019 1025   BILITOT 0.44 01/09/2017 0913   GFRNONAA 30 (L) 05/18/2019 0940   GFRNONAA 27 (L) 05/11/2019 1025   GFRAA 35 (L) 05/18/2019 0940   GFRAA 31 (L) 05/11/2019 1025    No results found for: SPEP, UPEP  Lab Results  Component Value Date   WBC 10.4 05/18/2019   NEUTROABS 8.0 (H) 05/18/2019   HGB 10.2 (L) 05/18/2019   HCT 30.2 (L) 05/18/2019   MCV 86.0 05/18/2019   PLT 111 (L) 05/18/2019      Chemistry      Component Value Date/Time   NA 133 (L) 05/18/2019 0940   NA 140 01/09/2017 0913   K 3.9 05/18/2019 0940   K 3.7 01/09/2017 0913   CL 99 05/18/2019 0940   CL 107 06/29/2012 1131   CO2 23 05/18/2019 0940   CO2 21 (L) 01/09/2017 0913   BUN 32 (H) 05/18/2019 0940   BUN 9.8 01/09/2017 0913   CREATININE 2.09 (H) 05/18/2019 0940   CREATININE 2.26 (H) 05/11/2019 1025   CREATININE 1.5 (H) 01/09/2017 0913      Component Value Date/Time   CALCIUM 9.3 05/18/2019 0940   CALCIUM 7.3 (L) 04/22/2019 0435   CALCIUM 7.5 (L) 01/09/2017 0913   ALKPHOS 65 05/18/2019 0940   ALKPHOS 55 01/09/2017 0913   AST 10 (L) 05/18/2019 0940   AST 9 (L) 05/11/2019 1025   AST 12 01/09/2017 0913   ALT 10 05/18/2019 0940   ALT 9 05/11/2019 1025   ALT 10 01/09/2017 0913   BILITOT 0.9 05/18/2019 0940   BILITOT 0.7 05/11/2019 1025   BILITOT 0.44 01/09/2017 0913

## 2019-05-18 NOTE — Assessment & Plan Note (Signed)
He felt that his diarrhea is controlled but yet he has lost a lot of weight He is instructed to take Imodium as needed

## 2019-05-19 LAB — KAPPA/LAMBDA LIGHT CHAINS
Kappa free light chain: 93.2 mg/L — ABNORMAL HIGH (ref 3.3–19.4)
Kappa, lambda light chain ratio: 33.29 — ABNORMAL HIGH (ref 0.26–1.65)
Lambda free light chains: 2.8 mg/L — ABNORMAL LOW (ref 5.7–26.3)

## 2019-05-20 LAB — MULTIPLE MYELOMA PANEL, SERUM
Albumin SerPl Elph-Mcnc: 3.5 g/dL (ref 2.9–4.4)
Albumin/Glob SerPl: 1.2 (ref 0.7–1.7)
Alpha 1: 0.2 g/dL (ref 0.0–0.4)
Alpha2 Glob SerPl Elph-Mcnc: 0.7 g/dL (ref 0.4–1.0)
B-Globulin SerPl Elph-Mcnc: 0.8 g/dL (ref 0.7–1.3)
Gamma Glob SerPl Elph-Mcnc: 1.2 g/dL (ref 0.4–1.8)
Globulin, Total: 3 g/dL (ref 2.2–3.9)
IgA: 12 mg/dL — ABNORMAL LOW (ref 61–437)
IgG (Immunoglobin G), Serum: 1406 mg/dL (ref 603–1613)
IgM (Immunoglobulin M), Srm: 6 mg/dL — ABNORMAL LOW (ref 15–143)
M Protein SerPl Elph-Mcnc: 1.1 g/dL — ABNORMAL HIGH
Total Protein ELP: 6.5 g/dL (ref 6.0–8.5)

## 2019-05-25 ENCOUNTER — Encounter: Payer: Self-pay | Admitting: Hematology and Oncology

## 2019-05-25 ENCOUNTER — Other Ambulatory Visit: Payer: Self-pay

## 2019-05-25 ENCOUNTER — Inpatient Hospital Stay (HOSPITAL_BASED_OUTPATIENT_CLINIC_OR_DEPARTMENT_OTHER): Payer: Medicare Other | Admitting: Hematology and Oncology

## 2019-05-25 ENCOUNTER — Inpatient Hospital Stay: Payer: Medicare Other

## 2019-05-25 DIAGNOSIS — Z79899 Other long term (current) drug therapy: Secondary | ICD-10-CM | POA: Diagnosis not present

## 2019-05-25 DIAGNOSIS — C9002 Multiple myeloma in relapse: Secondary | ICD-10-CM

## 2019-05-25 DIAGNOSIS — Z515 Encounter for palliative care: Secondary | ICD-10-CM | POA: Diagnosis not present

## 2019-05-25 DIAGNOSIS — E860 Dehydration: Secondary | ICD-10-CM | POA: Diagnosis not present

## 2019-05-25 DIAGNOSIS — E785 Hyperlipidemia, unspecified: Secondary | ICD-10-CM | POA: Diagnosis not present

## 2019-05-25 DIAGNOSIS — R4182 Altered mental status, unspecified: Secondary | ICD-10-CM | POA: Diagnosis not present

## 2019-05-25 DIAGNOSIS — N183 Chronic kidney disease, stage 3 unspecified: Secondary | ICD-10-CM

## 2019-05-25 DIAGNOSIS — Z66 Do not resuscitate: Secondary | ICD-10-CM | POA: Diagnosis not present

## 2019-05-25 DIAGNOSIS — E871 Hypo-osmolality and hyponatremia: Secondary | ICD-10-CM | POA: Diagnosis not present

## 2019-05-25 DIAGNOSIS — E876 Hypokalemia: Secondary | ICD-10-CM | POA: Diagnosis not present

## 2019-05-25 DIAGNOSIS — R64 Cachexia: Secondary | ICD-10-CM | POA: Diagnosis not present

## 2019-05-25 DIAGNOSIS — E872 Acidosis: Secondary | ICD-10-CM | POA: Diagnosis not present

## 2019-05-25 DIAGNOSIS — R531 Weakness: Secondary | ICD-10-CM | POA: Diagnosis not present

## 2019-05-25 DIAGNOSIS — Z20822 Contact with and (suspected) exposure to covid-19: Secondary | ICD-10-CM | POA: Diagnosis not present

## 2019-05-25 DIAGNOSIS — Z9221 Personal history of antineoplastic chemotherapy: Secondary | ICD-10-CM | POA: Diagnosis not present

## 2019-05-25 DIAGNOSIS — G9341 Metabolic encephalopathy: Secondary | ICD-10-CM | POA: Diagnosis not present

## 2019-05-25 DIAGNOSIS — I130 Hypertensive heart and chronic kidney disease with heart failure and stage 1 through stage 4 chronic kidney disease, or unspecified chronic kidney disease: Secondary | ICD-10-CM | POA: Diagnosis not present

## 2019-05-25 DIAGNOSIS — I248 Other forms of acute ischemic heart disease: Secondary | ICD-10-CM | POA: Diagnosis not present

## 2019-05-25 DIAGNOSIS — K529 Noninfective gastroenteritis and colitis, unspecified: Secondary | ICD-10-CM | POA: Diagnosis not present

## 2019-05-25 DIAGNOSIS — N1831 Chronic kidney disease, stage 3a: Secondary | ICD-10-CM | POA: Diagnosis not present

## 2019-05-25 DIAGNOSIS — D61818 Other pancytopenia: Secondary | ICD-10-CM | POA: Diagnosis not present

## 2019-05-25 DIAGNOSIS — I5041 Acute combined systolic (congestive) and diastolic (congestive) heart failure: Secondary | ICD-10-CM | POA: Diagnosis not present

## 2019-05-25 DIAGNOSIS — E039 Hypothyroidism, unspecified: Secondary | ICD-10-CM | POA: Diagnosis not present

## 2019-05-25 DIAGNOSIS — R55 Syncope and collapse: Secondary | ICD-10-CM | POA: Diagnosis not present

## 2019-05-25 DIAGNOSIS — Z7982 Long term (current) use of aspirin: Secondary | ICD-10-CM | POA: Diagnosis not present

## 2019-05-25 DIAGNOSIS — N179 Acute kidney failure, unspecified: Secondary | ICD-10-CM | POA: Diagnosis not present

## 2019-05-25 LAB — COMPREHENSIVE METABOLIC PANEL
ALT: 29 U/L (ref 0–44)
AST: 12 U/L — ABNORMAL LOW (ref 15–41)
Albumin: 3.6 g/dL (ref 3.5–5.0)
Alkaline Phosphatase: 66 U/L (ref 38–126)
Anion gap: 9 (ref 5–15)
BUN: 33 mg/dL — ABNORMAL HIGH (ref 8–23)
CO2: 23 mmol/L (ref 22–32)
Calcium: 9 mg/dL (ref 8.9–10.3)
Chloride: 96 mmol/L — ABNORMAL LOW (ref 98–111)
Creatinine, Ser: 1.79 mg/dL — ABNORMAL HIGH (ref 0.61–1.24)
GFR calc Af Amer: 42 mL/min — ABNORMAL LOW (ref >60)
GFR calc non Af Amer: 36 mL/min — ABNORMAL LOW (ref >60)
Glucose, Bld: 122 mg/dL — ABNORMAL HIGH (ref 70–99)
Potassium: 4.6 mmol/L (ref 3.5–5.1)
Sodium: 128 mmol/L — ABNORMAL LOW (ref 135–145)
Total Bilirubin: 1 mg/dL (ref 0.3–1.2)
Total Protein: 6.7 g/dL (ref 6.5–8.1)

## 2019-05-25 LAB — CBC WITH DIFFERENTIAL/PLATELET
Abs Immature Granulocytes: 0.09 10*3/uL — ABNORMAL HIGH (ref 0.00–0.07)
Basophils Absolute: 0 10*3/uL (ref 0.0–0.1)
Basophils Relative: 0 %
Eosinophils Absolute: 0 10*3/uL (ref 0.0–0.5)
Eosinophils Relative: 0 %
HCT: 30.3 % — ABNORMAL LOW (ref 39.0–52.0)
Hemoglobin: 10.3 g/dL — ABNORMAL LOW (ref 13.0–17.0)
Immature Granulocytes: 1 %
Lymphocytes Relative: 8 %
Lymphs Abs: 0.9 10*3/uL (ref 0.7–4.0)
MCH: 29.7 pg (ref 26.0–34.0)
MCHC: 34 g/dL (ref 30.0–36.0)
MCV: 87.3 fL (ref 80.0–100.0)
Monocytes Absolute: 0.6 10*3/uL (ref 0.1–1.0)
Monocytes Relative: 5 %
Neutro Abs: 10.2 10*3/uL — ABNORMAL HIGH (ref 1.7–7.7)
Neutrophils Relative %: 86 %
Platelets: 75 10*3/uL — ABNORMAL LOW (ref 150–400)
RBC: 3.47 MIL/uL — ABNORMAL LOW (ref 4.22–5.81)
RDW: 23.2 % — ABNORMAL HIGH (ref 11.5–15.5)
WBC: 11.9 10*3/uL — ABNORMAL HIGH (ref 4.0–10.5)
nRBC: 0.3 % — ABNORMAL HIGH (ref 0.0–0.2)

## 2019-05-25 LAB — SAMPLE TO BLOOD BANK

## 2019-05-25 LAB — MAGNESIUM: Magnesium: 1.7 mg/dL (ref 1.7–2.4)

## 2019-05-25 MED ORDER — SELINEXOR (80 MG TWICE WEEKLY) 20 MG PO TBPK: ORAL_TABLET | ORAL | 11 refills | Status: AC

## 2019-05-25 NOTE — Progress Notes (Signed)
[place] OFFICE PROGRESS NOTE  Patient Care Team: Lucianne Lei, MD as PCP - General (Family Medicine) Heath Lark, MD as Consulting Physician (Hematology and Oncology) Beverely Pace, LCSW as Social Worker (General Practice)  ASSESSMENT & PLAN:  Multiple myeloma in relapse [facility]) He has mild decline this week with pancytopenia I recommend dose adjustment of his medication: he will continue on dexamethasone 16 mg every Tuesday and Thursday and reduce [name] at 60 mg every Tuesday and Thursday I will see him back in 2 week for further follow-up  Pancytopenia, acquired ([place]) Overall, this is slightly worse than last week I plan dose adjustment as above He is not symptomatic He does not need transfusion support  Chronic renal insufficiency, stage III (moderate) His renal function is stable, intermittently elevated due to dehydration I continue to encourage him to increase oral intake as tolerated   No orders of the defined types were placed in this encounter.   All questions were answered. The patient knows to call the clinic with any problems, questions or concerns. The total time spent in the appointment was 20 minutes encounter with patients including review of chart and various tests results, discussions about plan of care and coordination of care plan   Heath Lark, MD 05/25/2019 7:20 PM  INTERVAL HISTORY: Please see below for problem oriented charting. He returns with his son for appointment He is doing well although family noticed reduced oral intake He denies pain The patient denies any recent signs or symptoms of bleeding such as spontaneous epistaxis, hematuria or hematochezia. No diarrhea  SUMMARY OF ONCOLOGIC HISTORY: Oncology History Overview Note  Progressed on Elotuzumab, revlimid, Velcade, Daratumumab and Pomalyst   Multiple myeloma in relapse ([place])  12/12/2010 Initial Diagnosis   Multiple myeloma   02/16/2014 Imaging   Skeletal survey  showed diffuse osteopenia   03/02/2014 Bone Marrow Biopsy   Accession: [id number] biopsy showed only 5 % plasma cells. However, the biopsy was difficult and the bone was fragmented during the procedure. Cytogenetics and FISH study is normal   03/03/2014 Procedure   he has port placement   03/10/2014 - 05/19/2014 Chemotherapy   he received elotuzumab and revlimid   05/20/2014 - 02/25/2016 Chemotherapy   Treatment is switched to maintenance Revlimid only. Treatment is stopped due to progressive disease   11/15/2014 Adverse Reaction   He has mild worsening anemia. Does of Revlimid reduced to 5 mg 21 days on, 7 days off   03/06/2016 -  Chemotherapy   He received Daratumumab and Velcade. Velcade is stopped on 07/24/16   03/06/2016 - 02/15/2019 Chemotherapy   The patient had daratumumab with Pomalyst for chemotherapy treatment.     08/14/2016 Miscellaneous   Pomalyst is added along with Daratumumab   10/17/2016 Surgery   EGD was performed for coffee-ground emesis. Multiple linear superficial ulcers were noted in the esophagus from 25-35 cm from insertion. Severe esophagitis was noted from 30-35 cm from insertion. Multiple biopsies were obtained. A small hiatal hernia was noted. The gastric cavity contained coffee-ground fluid, after suctioning and lavage, no obvious gastric lesions/erosion/ulceration/erythema was noted. Biopsies were taken from the antrum to rule out H. Pylori. Duodenum bulb appeared unremarkable. A small diverticulum was noted in second portion of the duodenum.     12/06/2016 Imaging   Ct scan abdomen No acute findings.  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  Stable small hiatal hernia.  Colonic diverticulosis, without radiographic evidence of diverticulitis.  REVIEW OF SYSTEMS:   Constitutional: Denies fevers, chills or abnormal weight loss Eyes: Denies blurriness of vision Ears, nose, mouth, throat, and face: Denies  mucositis or sore throat Respiratory: Denies cough, dyspnea or wheezes Cardiovascular: Denies palpitation, chest discomfort or lower extremity swelling Gastrointestinal:  Denies nausea, heartburn or change in bowel habits Skin: Denies abnormal skin rashes Lymphatics: Denies new lymphadenopathy or easy bruising Neurological:Denies numbness, tingling or new weaknesses Behavioral/Psych: Mood is stable, no new changes  All other systems were reviewed with the patient and are negative.  I have reviewed the past medical history, past surgical history, social history and family history with the patient and they are unchanged from previous note.  ALLERGIES:  has No Known Allergies.  MEDICATIONS:  Current Outpatient Medications  Medication Sig Dispense Refill  . acyclovir (ZOVIRAX) 400 MG tablet Take 1 tablet (400 mg total) by mouth daily. 60 tablet 11  . allopurinol (ZYLOPRIM) 100 MG tablet Take 100 mg by mouth 2 (two) times daily.     Marland Kitchen amLODipine (NORVASC) 10 MG tablet Take 10 mg by mouth daily.  1  . aspirin EC 81 MG tablet Take 81 mg by mouth daily.    Marland Kitchen atorvastatin (LIPITOR) 20 MG tablet Take 20 mg by mouth at bedtime.     . brimonidine (ALPHAGAN) 0.2 % ophthalmic solution Place 1 drop into the right eye 3 (three) times daily.     . calcium carbonate (TUMS - DOSED IN MG ELEMENTAL CALCIUM) 500 MG chewable tablet Chew 1 tablet by mouth daily as needed for indigestion or heartburn.    . calcium-vitamin D (OSCAL WITH D) 500-200 MG-UNIT tablet Take 2 tablets by mouth 3 (three) times daily.    Marland Kitchen dexamethasone (DECADRON) 4 MG tablet Take 4 tabs on days 1 and 3 every week, take with food, preferably in the morning 40 tablet 11  . dorzolamide-timolol (COSOPT) 22.3-6.8 MG/ML ophthalmic solution Place 1 drop into the right eye 2 (two) times daily.     Arna Medici 75 MCG tablet Take 75 mcg by mouth daily before breakfast.    . fluorometholone (FML) 0.1 % ophthalmic suspension Place 1 drop into the right  eye daily.     Marland Kitchen latanoprost (XALATAN) 0.005 % ophthalmic solution Place 1 drop into the right eye at bedtime.    . lidocaine-prilocaine (EMLA) cream Apply to affected area once 30 g 3  . magnesium oxide (MAG-OX) 400 (241.3 Mg) MG tablet Take 1 tablet (400 mg total) by mouth daily. 30 tablet 11  . ondansetron (ZOFRAN) 8 MG tablet Take 1 tablet (8 mg total) by mouth every 8 (eight) hours as needed (Nausea or vomiting). 30 tablet 1  . pantoprazole (PROTONIX) 40 MG tablet TAKE 1 TABLET BY MOUTH EVERY DAY 90 tablet 5  . prochlorperazine (COMPAZINE) 10 MG tablet Take 1 tablet (10 mg total) by mouth every 6 (six) hours as needed for nausea or vomiting. 30 tablet 1  . selinexor 20 MG TBPK Take 60 mg twice weekly on Tuesdays and Thursdays 32 each 11   No current facility-administered medications for this visit.    PHYSICAL EXAMINATION: ECOG PERFORMANCE STATUS: 2 - Symptomatic, <50% confined to bed  Vitals:   05/25/19 1121  BP: 111/76  Pulse: 63  Resp: 17  Temp: 98 F (36.7 C)  SpO2: 100%   Filed Weights   05/25/19 1121  Weight: 131 lb 14.4 oz (59.8 kg)    GENERAL:alert, no distress and comfortable  NEURO: alert & oriented x  3 with fluent speech, no focal motor/sensory deficits  LABORATORY DATA:  I have reviewed the data as listed    Component Value Date/Time   NA 128 (L) 05/25/2019 1049   NA 140 01/09/2017 0913   K 4.6 05/25/2019 1049   K 3.7 01/09/2017 0913   CL 96 (L) 05/25/2019 1049   CL 107 06/29/2012 1131   CO2 23 05/25/2019 1049   CO2 21 (L) 01/09/2017 0913   GLUCOSE 122 (H) 05/25/2019 1049   GLUCOSE 80 01/09/2017 0913   GLUCOSE 143 (H) 06/29/2012 1131   BUN 33 (H) 05/25/2019 1049   BUN 9.8 01/09/2017 0913   CREATININE 1.79 (H) 05/25/2019 1049   CREATININE 2.26 (H) 05/11/2019 1025   CREATININE 1.5 (H) 01/09/2017 0913   CALCIUM 9.0 05/25/2019 1049   CALCIUM 7.3 (L) 04/22/2019 0435   CALCIUM 7.5 (L) 01/09/2017 0913   PROT 6.7 05/25/2019 1049   PROT 5.7 (L)  01/09/2017 0913   ALBUMIN 3.6 05/25/2019 1049   ALBUMIN 2.9 (L) 01/09/2017 0913   AST 12 (L) 05/25/2019 1049   AST 9 (L) 05/11/2019 1025   AST 12 01/09/2017 0913   ALT 29 05/25/2019 1049   ALT 9 05/11/2019 1025   ALT 10 01/09/2017 0913   ALKPHOS 66 05/25/2019 1049   ALKPHOS 55 01/09/2017 0913   BILITOT 1.0 05/25/2019 1049   BILITOT 0.7 05/11/2019 1025   BILITOT 0.44 01/09/2017 0913   GFRNONAA 36 (L) 05/25/2019 1049   GFRNONAA 27 (L) 05/11/2019 1025   GFRAA 42 (L) 05/25/2019 1049   GFRAA 31 (L) 05/11/2019 1025    No results found for: SPEP, UPEP  Lab Results  Component Value Date   WBC 11.9 (H) 05/25/2019   NEUTROABS 10.2 (H) 05/25/2019   HGB 10.3 (L) 05/25/2019   HCT 30.3 (L) 05/25/2019   MCV 87.3 05/25/2019   PLT 75 (L) 05/25/2019      Chemistry      Component Value Date/Time   NA 128 (L) 05/25/2019 1049   NA 140 01/09/2017 0913   K 4.6 05/25/2019 1049   K 3.7 01/09/2017 0913   CL 96 (L) 05/25/2019 1049   CL 107 06/29/2012 1131   CO2 23 05/25/2019 1049   CO2 21 (L) 01/09/2017 0913   BUN 33 (H) 05/25/2019 1049   BUN 9.8 01/09/2017 0913   CREATININE 1.79 (H) 05/25/2019 1049   CREATININE 2.26 (H) 05/11/2019 1025   CREATININE 1.5 (H) 01/09/2017 0913      Component Value Date/Time   CALCIUM 9.0 05/25/2019 1049   CALCIUM 7.3 (L) 04/22/2019 0435   CALCIUM 7.5 (L) 01/09/2017 0913   ALKPHOS 66 05/25/2019 1049   ALKPHOS 55 01/09/2017 0913   AST 12 (L) 05/25/2019 1049   AST 9 (L) 05/11/2019 1025   AST 12 01/09/2017 0913   ALT 29 05/25/2019 1049   ALT 9 05/11/2019 1025   ALT 10 01/09/2017 0913   BILITOT 1.0 05/25/2019 1049   BILITOT 0.7 05/11/2019 1025   BILITOT 0.44 01/09/2017 0913

## 2019-05-25 NOTE — Assessment & Plan Note (Signed)
He has mild decline this week with pancytopenia I recommend dose adjustment of his medication: he will continue on dexamethasone 16 mg every Tuesday and Thursday and reduce [name] at 60 mg every Tuesday and Thursday I will see him back in 2 week for further follow-up

## 2019-05-25 NOTE — Assessment & Plan Note (Signed)
His renal function is stable, intermittently elevated due to dehydration I continue to encourage him to increase oral intake as tolerated

## 2019-05-25 NOTE — Assessment & Plan Note (Signed)
Overall, this is slightly worse than last week I plan dose adjustment as above He is not symptomatic He does not need transfusion support

## 2019-05-28 ENCOUNTER — Other Ambulatory Visit: Payer: Self-pay

## 2019-05-28 ENCOUNTER — Encounter (HOSPITAL_COMMUNITY): Payer: Self-pay

## 2019-05-28 ENCOUNTER — Emergency Department (HOSPITAL_COMMUNITY): Payer: Medicare Other

## 2019-05-28 ENCOUNTER — Inpatient Hospital Stay (HOSPITAL_COMMUNITY)
Admission: EM | Admit: 2019-05-28 | Discharge: 2019-07-08 | DRG: 682 | Disposition: E | Payer: Medicare Other | Attending: Internal Medicine | Admitting: Internal Medicine

## 2019-05-28 DIAGNOSIS — I5041 Acute combined systolic (congestive) and diastolic (congestive) heart failure: Secondary | ICD-10-CM

## 2019-05-28 DIAGNOSIS — L89892 Pressure ulcer of other site, stage 2: Secondary | ICD-10-CM | POA: Diagnosis present

## 2019-05-28 DIAGNOSIS — E872 Acidosis: Secondary | ICD-10-CM | POA: Diagnosis present

## 2019-05-28 DIAGNOSIS — Z515 Encounter for palliative care: Secondary | ICD-10-CM | POA: Diagnosis not present

## 2019-05-28 DIAGNOSIS — R531 Weakness: Secondary | ICD-10-CM | POA: Diagnosis not present

## 2019-05-28 DIAGNOSIS — D61818 Other pancytopenia: Secondary | ICD-10-CM | POA: Diagnosis present

## 2019-05-28 DIAGNOSIS — E875 Hyperkalemia: Secondary | ICD-10-CM | POA: Diagnosis not present

## 2019-05-28 DIAGNOSIS — Z79890 Hormone replacement therapy (postmenopausal): Secondary | ICD-10-CM

## 2019-05-28 DIAGNOSIS — N1831 Chronic kidney disease, stage 3a: Secondary | ICD-10-CM | POA: Diagnosis present

## 2019-05-28 DIAGNOSIS — E860 Dehydration: Secondary | ICD-10-CM | POA: Diagnosis not present

## 2019-05-28 DIAGNOSIS — R64 Cachexia: Secondary | ICD-10-CM | POA: Diagnosis present

## 2019-05-28 DIAGNOSIS — I130 Hypertensive heart and chronic kidney disease with heart failure and stage 1 through stage 4 chronic kidney disease, or unspecified chronic kidney disease: Secondary | ICD-10-CM | POA: Diagnosis present

## 2019-05-28 DIAGNOSIS — E785 Hyperlipidemia, unspecified: Secondary | ICD-10-CM | POA: Diagnosis present

## 2019-05-28 DIAGNOSIS — N179 Acute kidney failure, unspecified: Secondary | ICD-10-CM | POA: Diagnosis not present

## 2019-05-28 DIAGNOSIS — E876 Hypokalemia: Secondary | ICD-10-CM | POA: Diagnosis not present

## 2019-05-28 DIAGNOSIS — I248 Other forms of acute ischemic heart disease: Secondary | ICD-10-CM | POA: Diagnosis present

## 2019-05-28 DIAGNOSIS — T451X5A Adverse effect of antineoplastic and immunosuppressive drugs, initial encounter: Secondary | ICD-10-CM | POA: Diagnosis present

## 2019-05-28 DIAGNOSIS — R4182 Altered mental status, unspecified: Secondary | ICD-10-CM

## 2019-05-28 DIAGNOSIS — E162 Hypoglycemia, unspecified: Secondary | ICD-10-CM | POA: Diagnosis not present

## 2019-05-28 DIAGNOSIS — N400 Enlarged prostate without lower urinary tract symptoms: Secondary | ICD-10-CM | POA: Diagnosis present

## 2019-05-28 DIAGNOSIS — E871 Hypo-osmolality and hyponatremia: Secondary | ICD-10-CM | POA: Diagnosis present

## 2019-05-28 DIAGNOSIS — D649 Anemia, unspecified: Secondary | ICD-10-CM | POA: Diagnosis not present

## 2019-05-28 DIAGNOSIS — Z79899 Other long term (current) drug therapy: Secondary | ICD-10-CM

## 2019-05-28 DIAGNOSIS — E039 Hypothyroidism, unspecified: Secondary | ICD-10-CM | POA: Diagnosis present

## 2019-05-28 DIAGNOSIS — Z66 Do not resuscitate: Secondary | ICD-10-CM | POA: Diagnosis not present

## 2019-05-28 DIAGNOSIS — G9340 Encephalopathy, unspecified: Secondary | ICD-10-CM | POA: Diagnosis not present

## 2019-05-28 DIAGNOSIS — K529 Noninfective gastroenteritis and colitis, unspecified: Secondary | ICD-10-CM | POA: Diagnosis present

## 2019-05-28 DIAGNOSIS — Z9221 Personal history of antineoplastic chemotherapy: Secondary | ICD-10-CM | POA: Diagnosis not present

## 2019-05-28 DIAGNOSIS — R9431 Abnormal electrocardiogram [ECG] [EKG]: Secondary | ICD-10-CM | POA: Diagnosis not present

## 2019-05-28 DIAGNOSIS — G9341 Metabolic encephalopathy: Secondary | ICD-10-CM | POA: Diagnosis not present

## 2019-05-28 DIAGNOSIS — I10 Essential (primary) hypertension: Secondary | ICD-10-CM | POA: Diagnosis not present

## 2019-05-28 DIAGNOSIS — Z20822 Contact with and (suspected) exposure to covid-19: Secondary | ICD-10-CM | POA: Diagnosis present

## 2019-05-28 DIAGNOSIS — Z681 Body mass index (BMI) 19 or less, adult: Secondary | ICD-10-CM

## 2019-05-28 DIAGNOSIS — Z7982 Long term (current) use of aspirin: Secondary | ICD-10-CM

## 2019-05-28 DIAGNOSIS — C9002 Multiple myeloma in relapse: Secondary | ICD-10-CM | POA: Diagnosis present

## 2019-05-28 DIAGNOSIS — D696 Thrombocytopenia, unspecified: Secondary | ICD-10-CM | POA: Diagnosis not present

## 2019-05-28 DIAGNOSIS — R197 Diarrhea, unspecified: Secondary | ICD-10-CM

## 2019-05-28 DIAGNOSIS — Z7189 Other specified counseling: Secondary | ICD-10-CM | POA: Diagnosis not present

## 2019-05-28 DIAGNOSIS — I2489 Other forms of acute ischemic heart disease: Secondary | ICD-10-CM

## 2019-05-28 DIAGNOSIS — I959 Hypotension, unspecified: Secondary | ICD-10-CM | POA: Diagnosis not present

## 2019-05-28 DIAGNOSIS — R55 Syncope and collapse: Secondary | ICD-10-CM | POA: Diagnosis not present

## 2019-05-28 LAB — COMPREHENSIVE METABOLIC PANEL
ALT: 24 U/L (ref 0–44)
AST: 15 U/L (ref 15–41)
Albumin: 3.6 g/dL (ref 3.5–5.0)
Alkaline Phosphatase: 57 U/L (ref 38–126)
Anion gap: 16 — ABNORMAL HIGH (ref 5–15)
BUN: 65 mg/dL — ABNORMAL HIGH (ref 8–23)
CO2: 25 mmol/L (ref 22–32)
Calcium: 8.8 mg/dL — ABNORMAL LOW (ref 8.9–10.3)
Chloride: 92 mmol/L — ABNORMAL LOW (ref 98–111)
Creatinine, Ser: 2.65 mg/dL — ABNORMAL HIGH (ref 0.61–1.24)
GFR calc Af Amer: 26 mL/min — ABNORMAL LOW (ref >60)
GFR calc non Af Amer: 22 mL/min — ABNORMAL LOW (ref >60)
Glucose, Bld: 227 mg/dL — ABNORMAL HIGH (ref 70–99)
Potassium: 4 mmol/L (ref 3.5–5.1)
Sodium: 133 mmol/L — ABNORMAL LOW (ref 135–145)
Total Bilirubin: 1.2 mg/dL (ref 0.3–1.2)
Total Protein: 6.7 g/dL (ref 6.5–8.1)

## 2019-05-28 LAB — CBC WITH DIFFERENTIAL/PLATELET
Abs Immature Granulocytes: 0.07 10*3/uL (ref 0.00–0.07)
Basophils Absolute: 0 10*3/uL (ref 0.0–0.1)
Basophils Relative: 0 %
Eosinophils Absolute: 0 10*3/uL (ref 0.0–0.5)
Eosinophils Relative: 0 %
HCT: 31.7 % — ABNORMAL LOW (ref 39.0–52.0)
Hemoglobin: 11.2 g/dL — ABNORMAL LOW (ref 13.0–17.0)
Immature Granulocytes: 1 %
Lymphocytes Relative: 4 %
Lymphs Abs: 0.3 10*3/uL — ABNORMAL LOW (ref 0.7–4.0)
MCH: 30.9 pg (ref 26.0–34.0)
MCHC: 35.3 g/dL (ref 30.0–36.0)
MCV: 87.3 fL (ref 80.0–100.0)
Monocytes Absolute: 0.2 10*3/uL (ref 0.1–1.0)
Monocytes Relative: 2 %
Neutro Abs: 7.7 10*3/uL (ref 1.7–7.7)
Neutrophils Relative %: 93 %
Platelets: 44 10*3/uL — ABNORMAL LOW (ref 150–400)
RBC: 3.63 MIL/uL — ABNORMAL LOW (ref 4.22–5.81)
RDW: 23.4 % — ABNORMAL HIGH (ref 11.5–15.5)
WBC: 8.2 10*3/uL (ref 4.0–10.5)
nRBC: 1.2 % — ABNORMAL HIGH (ref 0.0–0.2)

## 2019-05-28 LAB — ETHANOL: Alcohol, Ethyl (B): <10 mg/dL (ref <10)

## 2019-05-28 LAB — LACTIC ACID, PLASMA
Lactic Acid, Venous: 4.1 mmol/L (ref 0.5–1.9)
Lactic Acid, Venous: 3.1 mmol/L (ref 0.5–1.9)
Lactic Acid, Venous: 2.3 mmol/L (ref 0.5–1.9)

## 2019-05-28 LAB — BLOOD GAS, VENOUS
Acid-base deficit: 2.8 mmol/L — ABNORMAL HIGH (ref 0.0–2.0)
Bicarbonate: 21.7 mmol/L (ref 20.0–28.0)
FIO2: 21
O2 Saturation: 90.7 %
Patient temperature: 98.6
pCO2, Ven: 39.1 mmHg — ABNORMAL LOW (ref 44.0–60.0)
pH, Ven: 7.363 (ref 7.250–7.430)
pO2, Ven: 67.8 mmHg — ABNORMAL HIGH (ref 32.0–45.0)

## 2019-05-28 LAB — I-STAT CHEM 8, ED
BUN: 51 mg/dL — ABNORMAL HIGH (ref 8–23)
Calcium, Ion: 1.12 mmol/L — ABNORMAL LOW (ref 1.15–1.40)
Chloride: 95 mmol/L — ABNORMAL LOW (ref 98–111)
Creatinine, Ser: 2.7 mg/dL — ABNORMAL HIGH (ref 0.61–1.24)
Glucose, Bld: 213 mg/dL — ABNORMAL HIGH (ref 70–99)
HCT: 32 % — ABNORMAL LOW (ref 39.0–52.0)
Hemoglobin: 10.9 g/dL — ABNORMAL LOW (ref 13.0–17.0)
Potassium: 3.7 mmol/L (ref 3.5–5.1)
Sodium: 131 mmol/L — ABNORMAL LOW (ref 135–145)
TCO2: 24 mmol/L (ref 22–32)

## 2019-05-28 LAB — LIPASE, BLOOD: Lipase: 35 U/L (ref 11–51)

## 2019-05-28 LAB — PROTIME-INR
INR: 1.2 (ref 0.8–1.2)
Prothrombin Time: 15 seconds (ref 11.4–15.2)

## 2019-05-28 LAB — TSH: TSH: 0.606 u[IU]/mL (ref 0.350–4.500)

## 2019-05-28 LAB — MRSA PCR SCREENING: MRSA by PCR: NEGATIVE

## 2019-05-28 LAB — CBG MONITORING, ED: Glucose-Capillary: 225 mg/dL — ABNORMAL HIGH (ref 70–99)

## 2019-05-28 LAB — AMMONIA: Ammonia: 14 umol/L (ref 9–35)

## 2019-05-28 LAB — SARS CORONAVIRUS 2 BY RT PCR (HOSPITAL ORDER, PERFORMED IN ~~LOC~~ HOSPITAL LAB): SARS Coronavirus 2: NEGATIVE

## 2019-05-28 MED ORDER — ORAL CARE MOUTH RINSE
15.0000 mL | Freq: Two times a day (BID) | OROMUCOSAL | Status: DC
  Administered 2019-05-28 – 2019-06-08 (×21): 15 mL via OROMUCOSAL

## 2019-05-28 MED ORDER — CHLORHEXIDINE GLUCONATE CLOTH 2 % EX PADS
6.0000 | MEDICATED_PAD | Freq: Every day | CUTANEOUS | Status: DC
  Administered 2019-05-28 – 2019-06-08 (×10): 6 via TOPICAL

## 2019-05-28 MED ORDER — SODIUM CHLORIDE 0.9 % IV SOLN
2.0000 g | INTRAVENOUS | Status: DC
  Administered 2019-05-29 – 2019-05-30 (×2): 2 g via INTRAVENOUS
  Filled 2019-05-28 (×2): qty 2

## 2019-05-28 MED ORDER — ALLOPURINOL 100 MG PO TABS
100.0000 mg | ORAL_TABLET | Freq: Two times a day (BID) | ORAL | Status: DC
  Administered 2019-06-01 (×2): 100 mg via ORAL
  Filled 2019-05-28 (×7): qty 1

## 2019-05-28 MED ORDER — ASPIRIN EC 81 MG PO TBEC: 81.0000 mg | DELAYED_RELEASE_TABLET | Freq: Every day | ORAL | Status: DC

## 2019-05-28 MED ORDER — SODIUM CHLORIDE 0.9 % IV BOLUS
1000.0000 mL | Freq: Once | INTRAVENOUS | Status: AC
  Administered 2019-05-28: 1000 mL via INTRAVENOUS

## 2019-05-28 MED ORDER — FLUOROMETHOLONE 0.1 % OP SUSP
1.0000 [drp] | Freq: Every day | OPHTHALMIC | Status: DC
  Administered 2019-05-29 – 2019-06-08 (×11): 1 [drp] via OPHTHALMIC
  Filled 2019-05-28 (×2): qty 5

## 2019-05-28 MED ORDER — BRIMONIDINE TARTRATE 0.2 % OP SOLN
1.0000 [drp] | Freq: Three times a day (TID) | OPHTHALMIC | Status: DC
  Administered 2019-05-29 – 2019-06-08 (×30): 1 [drp] via OPHTHALMIC
  Filled 2019-05-28: qty 5

## 2019-05-28 MED ORDER — ACETAMINOPHEN 650 MG RE SUPP: 650.0000 mg | Freq: Four times a day (QID) | RECTAL | Status: DC | PRN

## 2019-05-28 MED ORDER — ACETAMINOPHEN 325 MG PO TABS: 650.0000 mg | ORAL_TABLET | Freq: Four times a day (QID) | ORAL | Status: DC | PRN

## 2019-05-28 MED ORDER — DEXTROSE-NACL 5-0.9 % IV SOLN: INTRAVENOUS | Status: DC

## 2019-05-28 MED ORDER — ALBUTEROL SULFATE (2.5 MG/3ML) 0.083% IN NEBU
2.5000 mg | INHALATION_SOLUTION | RESPIRATORY_TRACT | Status: DC | PRN
  Administered 2019-06-03 – 2019-06-04 (×2): 2.5 mg via RESPIRATORY_TRACT
  Filled 2019-05-28 (×2): qty 3

## 2019-05-28 MED ORDER — LATANOPROST 0.005 % OP SOLN
1.0000 [drp] | Freq: Every day | OPHTHALMIC | Status: DC
  Administered 2019-05-29 – 2019-06-07 (×9): 1 [drp] via OPHTHALMIC
  Filled 2019-05-28: qty 2.5

## 2019-05-28 MED ORDER — DORZOLAMIDE HCL-TIMOLOL MAL 2-0.5 % OP SOLN
1.0000 [drp] | Freq: Two times a day (BID) | OPHTHALMIC | Status: DC
  Administered 2019-05-29 – 2019-06-08 (×19): 1 [drp] via OPHTHALMIC
  Filled 2019-05-28: qty 10

## 2019-05-28 MED ORDER — SODIUM CHLORIDE 0.9 % IV BOLUS
30.0000 mL/kg | Freq: Once | INTRAVENOUS | Status: AC
  Administered 2019-05-28: 1794 mL via INTRAVENOUS

## 2019-05-28 MED ORDER — LEVOTHYROXINE SODIUM 50 MCG PO TABS: 75.0000 ug | ORAL_TABLET | Freq: Every day | ORAL | Status: DC

## 2019-05-28 MED ORDER — ONDANSETRON HCL 4 MG PO TABS: 4.0000 mg | ORAL_TABLET | Freq: Four times a day (QID) | ORAL | Status: DC | PRN

## 2019-05-28 MED ORDER — ONDANSETRON HCL 4 MG/2ML IJ SOLN: 4.0000 mg | Freq: Four times a day (QID) | INTRAMUSCULAR | Status: DC | PRN

## 2019-05-28 MED ORDER — SODIUM CHLORIDE 0.9 % IV SOLN
2.0000 g | Freq: Once | INTRAVENOUS | Status: AC
  Administered 2019-05-28: 2 g via INTRAVENOUS
  Filled 2019-05-28: qty 2

## 2019-05-28 MED ORDER — PANTOPRAZOLE SODIUM 40 MG IV SOLR
40.0000 mg | INTRAVENOUS | Status: DC
  Administered 2019-05-28 – 2019-05-29 (×2): 40 mg via INTRAVENOUS
  Filled 2019-05-28: qty 40

## 2019-05-28 NOTE — ED Notes (Signed)
CBG 245. 

## 2019-05-28 NOTE — ED Triage Notes (Signed)
Pt arrived POV from home CC unresponsives and failure to thrive from family. Pts son states " he has been like this for about 2 days coming in and out". Pt unable to respond verbally or move extremity's. Per son pts is able to speak and move at baseline.    Hx cancer, low platlets

## 2019-05-28 NOTE — Progress Notes (Signed)
Pharmacy Antibiotic Note  Calvin Mccormick is a 74 y.o. male admitted on 05/23/2019 with possible infection, unknown source. Lactic acid elevated at 4.1 on presentation to ED. Pharmacy has been consulted for Cefepime dosing.  Plan: Cefepime 2g IV q24h Monitor renal function, cultures, clinical course     Temp (24hrs), Avg:97.4 F (36.3 C), Min:97.4 F (36.3 C), Max:97.4 F (36.3 C)  Recent Labs  Lab 05/25/19 1049 05/21/2019 1531 05/25/2019 1554 05/23/2019 1727  WBC 11.9* 8.2  --   --   CREATININE 1.79* 2.65* 2.70*  --   LATICACIDVEN  --  4.1*  --  3.1*    Estimated Creatinine Clearance: 19.7 mL/min (A) (by C-G formula based on SCr of 2.7 mg/dL (H)).    No Known Allergies  Antimicrobials this admission: 5/21 Cefepime >>  Dose adjustments this admission: --  Microbiology results: 5/21 BCx: sent 5/21 UCx: ordered   5/21 MRSA PCR: sent 5/21 COVID: negative   Thank you for allowing pharmacy to be a part of this patient's care.  Luiz Ochoa 05/18/2019 6:24 PM

## 2019-05-28 NOTE — ED Notes (Signed)
Hospitalist at bedside. DNR wishes discussed between provider and pt/wife.

## 2019-05-28 NOTE — H&P (Signed)
History and Physical    Calvin Mccormick:[id number] DOB: 1945/08/28 DOA: 05/09/2019  PCP: Lucianne Lei, MD   Patient coming from: home  Chief Complaint:  confusion  HPI: Calvin Mccormick is a 74 y.o. male with medical history significant for multiple myeloma in relapse currently on chemotherapy-dexamethasone 16 mg every Tuesday and Thursday and selinexor at 60 mg every Tuesday and Thursday followed by Dr. 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  Patient unable to provide history and presentation.  As per family patient developed significant nausea and vomiting after taking "stomach pills" yesterday afternoon and became less responsive and since then has been intermittently unresponsive sometimes walking up, not been eating well for the past 2 days.  No reported fever, chest pain, nausea, vomiting, fever, chills,, focal weakness, dysuria.  In ED:On presentation patient was minimally responsive, vitals with a stable blood pressure saturating well on room air, temperature 97.4, blood work showed hyponatremia, worsening renal failure lactic acid is 4.1, hemoglobin 10.9 g, platelet 44,000.  Patient was given 1800 mL of fluid about to finish his bolus, since starting fluid patient has perked up is much more alert awake oriented knows he is in the hospital. When I saw his wife is at the bedside helping with history.  Last x-ray and CT head no acute findings. Repeat lactic acid has been sent, UA VBG, Covid screening pending. Admission was requested for further management.  Review of Systems: Unable to obtain full review of system due to patient presentation but on review he currently denies any nausea, abdominal pain, dysuria, focal weakness, headache, chest pain, shortness of breath. Positive for generalized weakness.  Past  Medical History:  Diagnosis Date  . Anemia 11/27/2010  . Cancer ([place])   . Chronic renal insufficiency, stage III (moderate) 06/17/2011   Due to myeloma & HTN  . CKD (chronic kidney disease), stage III 02/05/2011  . 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 Diarrhea 06/17/2011   Hospital admission 05/30/11 velcade toxicity? Infectious?  Marland Kitchen Hyperlipemia   . Hypertension, benign essential, goal below 140/90 11/27/2010  . Hypokalemia with normal acid-base balance 07/29/2011  . Hypomagnesemia 07/29/2011  . Seasonal allergies     Past Surgical History:  Procedure Laterality Date  . COLONOSCOPY  07/26/2011   Procedure: COLONOSCOPY;  Surgeon: Cleotis Nipper, MD;  Location: [facility];  Service: Endoscopy;  Laterality: N/A;  . ESOPHAGOGASTRODUODENOSCOPY (EGD) WITH PROPOFOL Left 10/17/2016   Procedure: ESOPHAGOGASTRODUODENOSCOPY (EGD) WITH PROPOFOL;  Surgeon: Ronnette Juniper, MD;  Location: [facility];  Service: Gastroenterology;  Laterality: Left;  . EYE SURGERY     bilateral cataract  surgery  . MULTIPLE EXTRACTIONS WITH ALVEOLOPLASTY N/A 05/27/2014   Procedure: Extraction of tooth #'s 1,6,7,8,10,11,13,21,22,23,24,25,26,27 and 28 with alveoloplasty;  Surgeon: Lenn Cal, DDS;  Location: [facility];  Service: Oral Surgery;  Laterality: N/A;  . porta cath      for chemotherapy- right chest area     reports that he has never smoked. He has never used smokeless tobacco. He reports that he does not drink alcohol or use drugs.  No Known Allergies  Family History  Problem Relation Age of Onset  . Cancer Brother        lung ca     Prior to Admission medications   Medication Sig Start Date End Date Taking? Authorizing Provider  acyclovir (ZOVIRAX) 400 MG tablet  Take 1 tablet (400 mg total) by mouth daily. 03/01/19   Heath Lark, MD  allopurinol (ZYLOPRIM) 100 MG tablet Take 100 mg by mouth 2 (two) times daily.  12/06/12   [provider]  amLODipine (NORVASC) 10 MG tablet Take 10 mg by mouth  daily. 07/19/16   [provider]  aspirin EC 81 MG tablet Take 81 mg by mouth daily.    [provider]  atorvastatin (LIPITOR) 20 MG tablet Take 20 mg by mouth at bedtime.     [provider]  brimonidine (ALPHAGAN) 0.2 % ophthalmic solution Place 1 drop into the right eye 3 (three) times daily.  02/02/18   [provider]  calcium carbonate (TUMS - DOSED IN MG ELEMENTAL CALCIUM) 500 MG chewable tablet Chew 1 tablet by mouth daily as needed for indigestion or heartburn.    [provider]  calcium-vitamin D (OSCAL WITH D) 500-200 MG-UNIT tablet Take 2 tablets by mouth 3 (three) times daily. 04/22/19   Eugenie Filler, MD  dexamethasone (DECADRON) 4 MG tablet Take 4 tabs on days 1 and 3 every week, take with food, preferably in the morning 05/18/19   Heath Lark, MD  dorzolamide-timolol (COSOPT) 22.3-6.8 MG/ML ophthalmic solution Place 1 drop into the right eye 2 (two) times daily.  07/29/13   [provider]  EUTHYROX 75 MCG tablet Take 75 mcg by mouth daily before breakfast. 01/16/19   [provider]  fluorometholone (FML) 0.1 % ophthalmic suspension Place 1 drop into the right eye daily.     [provider]  latanoprost (XALATAN) 0.005 % ophthalmic solution Place 1 drop into the right eye at bedtime.    [provider]  lidocaine-prilocaine (EMLA) cream Apply to affected area once 06/08/18   Heath Lark, MD  magnesium oxide (MAG-OX) 400 (241.3 Mg) MG tablet Take 1 tablet (400 mg total) by mouth daily. 04/27/19   Heath Lark, MD  ondansetron (ZOFRAN) 8 MG tablet Take 1 tablet (8 mg total) by mouth every 8 (eight) hours as needed (Nausea or vomiting). 03/01/19   Heath Lark, MD  pantoprazole (PROTONIX) 40 MG tablet TAKE 1 TABLET BY MOUTH EVERY DAY 03/08/19   Heath Lark, MD  prochlorperazine (COMPAZINE) 10 MG tablet Take 1 tablet (10 mg total) by mouth every 6 (six) hours as needed for nausea or vomiting. 03/01/19   Heath Lark,  MD  selinexor 20 MG TBPK Take 60 mg twice weekly on Tuesdays and Thursdays 05/25/19   Heath Lark, MD    Physical Exam: Vitals:   05/22/2019 1533 06/02/2019 1630 05/25/2019 1715  BP: (!) 138/92 134/90 139/85  Pulse: 86 86 86  Resp: '14 14 14  ' Temp: (!) 97.4 F (36.3 C)    TempSrc: Oral    SpO2: 100% 99% 100%    General exam: Alert awake but mildly lethargic, follows commands, interactive, answers questions appropriately.   HEENT:Oral mucosa very dry, Ear/Nose WNL grossly, dentition normal. Respiratory system: bilaterally clear,no wheezing or crackles,no use of accessory muscle Cardiovascular system: S1 & S2 +, No JVD,. Gastrointestinal system: Abdomen soft, NT,ND, BS+ Nervous System:Alert, awake, moving extremities and grossly weak in all 4 extremities. Extremities: No edema, distal peripheral pulses palpable.  Skin: No rashes,no icterus. MSK: Seen muscle bulk,tone, power    Labs on Admission: I have personally reviewed following labs and imaging studies  CBC: Recent Labs  Lab 05/25/19 1049 05/22/2019 1531 06/03/2019 1554  WBC 11.9* 8.2  --   NEUTROABS 10.2* 7.7  --  HGB 10.3* 11.2* 10.9*  HCT 30.3* 31.7* 32.0*  MCV 87.3 87.3  --   PLT 75* 44*  --    Basic Metabolic Panel: Recent Labs  Lab 05/25/19 1049 05/10/2019 1531 05/12/2019 1554  NA 128* 133* 131*  K 4.6 4.0 3.7  CL 96* 92* 95*  CO2 23 25  --   GLUCOSE 122* 227* 213*  BUN 33* 65* 51*  CREATININE 1.79* 2.65* 2.70*  CALCIUM 9.0 8.8*  --   MG 1.7  --   --    GFR: Estimated Creatinine Clearance: 19.7 mL/min (A) (by C-G formula based on SCr of 2.7 mg/dL (H)). Liver Function Tests: Recent Labs  Lab 05/25/19 1049 05/30/2019 1531  AST 12* 15  ALT 29 24  ALKPHOS 66 57  BILITOT 1.0 1.2  PROT 6.7 6.7  ALBUMIN 3.6 3.6   Recent Labs  Lab 06/01/2019 1531  LIPASE 35   Recent Labs  Lab 05/27/2019 1531  AMMONIA 14   Coagulation Profile: Recent Labs  Lab 05/15/2019 1531  INR 1.2   Cardiac Enzymes: No results for  input(s): CKTOTAL, CKMB, CKMBINDEX, TROPONINI in the last 168 hours. BNP (last 3 results) No results for input(s): PROBNP in the last 8760 hours. HbA1C: No results for input(s): HGBA1C in the last 72 hours. CBG: Recent Labs  Lab 05/25/2019 1525  GLUCAP 225*   Lipid Profile: No results for input(s): CHOL, HDL, LDLCALC, TRIG, CHOLHDL, LDLDIRECT in the last 72 hours. Thyroid Function Tests: No results for input(s): TSH, T4TOTAL, FREET4, T3FREE, THYROIDAB in the last 72 hours. Anemia Panel: No results for input(s): VITAMINB12, FOLATE, FERRITIN, TIBC, IRON, RETICCTPCT in the last 72 hours. Urine analysis:    Component Value Date/Time   COLORURINE STRAW (A) 04/18/2019 2004   APPEARANCEUR CLEAR 04/18/2019 2004   LABSPEC 1.006 04/18/2019 2004   PHURINE 6.0 04/18/2019 2004   GLUCOSEU NEGATIVE 04/18/2019 2004   HGBUR NEGATIVE 04/18/2019 2004   [place] NEGATIVE 04/18/2019 2004   [place] NEGATIVE 04/18/2019 2004   PROTEINUR NEGATIVE 04/18/2019 2004   UROBILINOGEN 0.2 07/24/2011 1501   NITRITE NEGATIVE 04/18/2019 2004   LEUKOCYTESUR NEGATIVE 04/18/2019 2004    Radiological Exams on Admission: CT Head Wo Contrast  Result Date: 05/29/2019 CLINICAL DATA:  Unresponsive, failure to thrive, multiple myeloma EXAM: CT HEAD WITHOUT CONTRAST TECHNIQUE: Contiguous axial images were obtained from the base of the skull through the vertex without intravenous contrast. COMPARISON:  None. FINDINGS: Brain: There is scattered hypodensities throughout the periventricular white matter consistent with age-indeterminate small vessel ischemic changes. No other signs of acute infarct or hemorrhage. Lateral ventricles and midline structures are unremarkable. No acute extra-axial fluid collections. No mass effect. Vascular: No hyperdense vessel or unexpected calcification. Skull: Normal. Negative for fracture or focal lesion. Sinuses/Orbits: No acute finding. Other: None. IMPRESSION: 1. Age-indeterminate small  vessel ischemic changes throughout the periventricular white matter. No acute hemorrhage. Electronically Signed   By: Randa Ngo M.D.   On: 05/19/2019 17:04   DG Chest Port 1 View  Result Date: 05/14/2019 CLINICAL DATA:  Altered mental status. EXAM: PORTABLE CHEST 1 VIEW COMPARISON:  April 18, 2019 FINDINGS: There is stable right-sided venous Port-A-Cath positioning. Mildly decreased lung volumes are seen which is likely secondary to suboptimal patient inspiration. There is no evidence of acute infiltrate, pleural effusion or pneumothorax. The heart size and mediastinal contours are within normal limits. The visualized skeletal structures are unremarkable. IMPRESSION: No active disease. Electronically Signed   By: Joyce Gross.D.  On: 06/02/2019 16:41    Assessment/Plan  Acute encephalopathy likely metabolic due to severe dehydration: Mental status significantly improved after IV fluids.  CT head unremarkable, grossly nonfocal.  Will monitor closely, if no significant improvement can consider neuro eval/EEG.Keep n.p.o. for now.  Will out occult infection follow-up on UA and culture of the blood, ammonia normal, VBG pending for CO2 level  Multiple yeloma in relapse currently on chemotherapy-dexamethasone 16 mg every Tuesday and Thursday and selinexor at 60 mg every Tuesday and Thursday followed by Dr. Alvy Bimler  AKI on CKD stage IIIa. ckd from from MM baseline creatinine 1.2-1.6, bun IN 10s in 04/2019, slowly worsening recently  1.7-2.2: Today significantly worse BUN and creatinine.  AK/ATN from  due to patient's poor oral intake nausea vomiting.  Aggressive IV fluid hydration as patient looks very dry, UA pending.  Continue pubic for output measurement.  Check urine electrolytes, osmole, and bladder scan.  Nausea and vomiting yesterday currently none.  Add Protonix IV, IV fluids symptomatic management nausea medication.  Could be due to the patient's high-dose steroid.  LFTs lipase were  normal. Monitor.  Lactic acidosis no obvious source of infection noted UA pending but patient denies any urinary symptoms.  No leukocytosis or fever.  Trend lactic acid and continue IV fluid hydration.  Empirically given cefepime will continue until blood cultures are back then will de-escalate antibiotics.  Hypotonic hyponatremia/severe dehydration: In the setting of poor oral intake nausea vomiting.  Continue IV fluids.  Anemia/pancytopenia: Has been running low in the setting of multiple myeloma.  Will monitor closely.  Hypothyroidism check TSH, continue Synthroid tomorrow  Chronic diarrhea: Supportive care.  Hypertension: Pressure stable will hold  his home meds.   Hyperlipidemia: Hold home meds.  History of pressure ulcer on the vertebral column, mid stage 2 in April, cont woudn care.  Goals of care: I discussed patient's wishes regarding CODE STATUS he clearly indicated he does not want to have CPR and he does not want to be on respirator/ventilator- this was also asked by his wife at the bedside in ED and  Confirmed.  There is no height or weight on file to calculate BMI.   Severity of Illness: * I certify that at the point of admission it is my clinical judgment that the patient will require inpatient hospital care spanning beyond 2 midnights from the point of admission due to high intensity of service, high risk for further deterioration and high frequency of surveillance required with the patient's acute encephalopathy, renal dysfunction, lactic acidosis  DVT prophylaxis:  SCD Code Status:DNR-this was verified by patient wife at the bedside who also asked the patient and he wished to be DNR Family Communication: Admission, patients condition and plan of care including tests being ordered have been discussed with the patient and his wife who indicate understanding and agree with the plan and Code Status.  Consults called:   Antonieta Pert MD [facility]  If 7PM-7AM,  please contact night-coverage [email or web address]  05/30/2019, 5:48 PM

## 2019-05-28 NOTE — ED Notes (Signed)
Unable to collect second set of [facility] provider made aware

## 2019-05-28 NOTE — ED Provider Notes (Signed)
[facility] DEPT Provider Note   CSN: [id number] Arrival date & time: 05/13/2019  1514     History Chief Complaint  Patient presents with  . Altered Mental Status    Calvin Mccormick is a 74 y.o. male.  The history is provided by the patient, medical records and a relative. No language interpreter was used.   Calvin Mccormick is a 74 y.o. male who presents to the Emergency Department complaining of unresponsive. Level V caveat due to altered mental status. History is provided by the patient's son. He has a history of multiple myeloma and takes chemotherapy Tuesday and Thursdays. Son reports that yesterday the patient developed significant nausea and vomiting after taking, "stomach pills." Yesterday afternoon he became less responsive. Since yesterday he has been intermittently unresponsive. Son states that he does wake up occasionally. At baseline he is awake, alert, and oriented. No reports of fevers, chest pain, abdominal pain, diarrhea, dysuria. Patient lives at home with son and his wife.    Past Medical History:  Diagnosis Date  . Anemia 11/27/2010  . Cancer ([place])   . Chronic renal insufficiency, stage III (moderate) 06/17/2011   Due to myeloma & HTN  . CKD (chronic kidney disease), stage III 02/05/2011  . 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 Diarrhea 06/17/2011   Hospital admission 05/30/11 velcade toxicity? Infectious?  Marland Kitchen Hyperlipemia   . Hypertension, benign essential, goal below 140/90 11/27/2010  . Hypokalemia with normal acid-base balance 07/29/2011  . Hypomagnesemia 07/29/2011  . Seasonal allergies     Patient Active Problem List   Diagnosis Date Noted  . Multifocal pneumonia   . Sepsis with acute organ dysfunction without septic shock ([place])   . Hypothyroidism   . Decubitus skin ulcer 04/19/2019  . Community acquired pneumonia 04/18/2019  . CAP (community acquired pneumonia) 04/18/2019  . Hyperkalemia 04/13/2019  . Boil, leg 03/29/2019  .  Generalized weakness 03/29/2019  . Drug-induced hyperglycemia 03/23/2019  . Shingles rash 02/15/2019  . Chronic diarrhea 09/28/2018  . Tooth infection 04/13/2018  . Iron deficiency anemia 02/06/2017  . Dry skin dermatitis 02/06/2017  . Esophagitis, acute 12/12/2016  . Acute blood loss anemia 10/18/2016  . Aspiration pneumonia ([place]) 10/16/2016  . AKI (acute kidney injury) ([place]) 10/16/2016  . Upper GI bleed 10/16/2016  . Gait instability 05/22/2016  . Physical debility 05/22/2016  . Pancytopenia, acquired ([place]) 03/27/2016  . Goals of care, counseling/discussion 02/25/2016  . Port catheter in place 01/15/2016  . Acute neck pain 11/23/2015  . Renal failure 04/08/2015  . Acute renal failure (ARF) ([place]) 04/08/2015  . Acute renal failure superimposed on stage 3 chronic kidney disease ([place]) 04/08/2015  . Nausea and vomiting 04/08/2015  . Hypocalcemia 04/08/2015  . Pancytopenia due to antineoplastic chemotherapy ([place]) 11/15/2014  . 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 Protein calorie malnutrition ([place]) 10/06/2014  . 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 Cellulitis and abscess of hand 08/05/2011  . Hypomagnesemia 07/29/2011  . Hypokalemia with normal acid-base balance 07/29/2011  . HTN (hypertension) 07/24/2011  . Anemia in chronic renal disease 07/24/2011  . Diarrhea 06/17/2011  . Chronic renal insufficiency, stage III (moderate) 06/17/2011  . Hypokalemia 05/30/2011  . CKD (chronic kidney disease), stage III 02/05/2011  . Multiple myeloma in relapse ([place]) 12/12/2010  . 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 COLONOSCOPY  07/26/2011   Procedure: COLONOSCOPY;  Surgeon: Cleotis Nipper, MD;  Location: [facility] END[facility]COPY;  Service: Endoscopy;  Laterality: N/A;  . ESOPHAGOGASTRODUODENOSCOPY (EGD) WITH PROPOFOL Left 10/17/2016   Procedure: ESOPHAGOGASTRODUODENOSCOPY (EGD) WITH PROPOFOL;  Surgeon: Ronnette Juniper, MD;  Location:  [facility] ENDOSCOPY;  Service: Gastroenterology;  Laterality: Left;  . EYE SURGERY     bilateral cataract  surgery  . MULTIPLE EXTRACTIONS WITH ALVEOLOPLASTY N/A 05/27/2014   Procedure: Extraction of tooth #'s 1,6,7,8,10,11,13,21,22,23,24,25,26,27 and 28 with alveoloplasty;  Surgeon: Lenn Cal, DDS;  Location: [facility] ORS;  Service: Oral Surgery;  Laterality: N/A;  . porta cath      for chemotherapy- right chest area       Family History  Problem Relation Age of Onset  . Cancer Brother        lung ca    Social History   Tobacco Use  . Smoking status: Never Smoker  . Smokeless tobacco: Never Used  Substance Use Topics  . Alcohol use: No  . Drug use: No    Home Medications Prior to Admission medications   Medication Sig Start Date End Date Taking? Authorizing Provider  acyclovir (ZOVIRAX) 400 MG tablet Take 1 tablet (400 mg total) by mouth daily. 03/01/19   Heath Lark, MD  allopurinol (ZYLOPRIM) 100 MG tablet Take 100 mg by mouth 2 (two) times daily.  12/06/12   [provider]  amLODipine (NORVASC) 10 MG tablet Take 10 mg by mouth daily. 07/19/16   [provider]  aspirin EC 81 MG tablet Take 81 mg by mouth daily.    [provider]  atorvastatin (LIPITOR) 20 MG tablet Take 20 mg by mouth at bedtime.     [provider]  brimonidine (ALPHAGAN) 0.2 % ophthalmic solution Place 1 drop into the right eye 3 (three) times daily.  02/02/18   [provider]  calcium carbonate (TUMS - DOSED IN MG ELEMENTAL CALCIUM) 500 MG chewable tablet Chew 1 tablet by mouth daily as needed for indigestion or heartburn.    [provider]  calcium-vitamin D (OSCAL WITH D) 500-200 MG-UNIT tablet Take 2 tablets by mouth 3 (three) times daily. 04/22/19   Eugenie Filler, MD  dexamethasone (DECADRON) 4 MG tablet Take 4 tabs on days 1 and 3 every week, take with food, preferably in the morning 05/18/19   Heath Lark, MD  dorzolamide-timolol (COSOPT) 22.3-6.8  MG/ML ophthalmic solution Place 1 drop into the right eye 2 (two) times daily.  07/29/13   [provider]  EUTHYROX 75 MCG tablet Take 75 mcg by mouth daily before breakfast. 01/16/19   [provider]  fluorometholone (FML) 0.1 % ophthalmic suspension Place 1 drop into the right eye daily.     [provider]  latanoprost (XALATAN) 0.005 % ophthalmic solution Place 1 drop into the right eye at bedtime.    [provider]  lidocaine-prilocaine (EMLA) cream Apply to affected area once 06/08/18   Heath Lark, MD  magnesium oxide (MAG-OX) 400 (241.3 Mg) MG tablet Take 1 tablet (400 mg total) by mouth daily. 04/27/19   Heath Lark, MD  ondansetron (ZOFRAN) 8 MG tablet Take 1 tablet (8 mg total) by mouth every 8 (eight) hours as needed (Nausea or vomiting). 03/01/19   Heath Lark, MD  pantoprazole (PROTONIX) 40 MG tablet TAKE 1 TABLET BY MOUTH EVERY DAY 03/08/19   Heath Lark, MD  prochlorperazine (COMPAZINE) 10 MG tablet Take 1 tablet (10 mg total) by mouth every 6 (six) hours as needed for nausea or vomiting. 03/01/19   Heath Lark, MD  selinexor 20  MG TBPK Take 60 mg twice weekly on Tuesdays and Thursdays 05/25/19   Heath Lark, MD    Allergies    Patient has no known allergies.  Review of Systems   Review of Systems  All other systems reviewed and are negative.   Physical Exam Updated Vital Signs BP 139/85   Pulse 86   Temp (!) 97.4 F (36.3 C) (Oral)   Resp 14   SpO2 100%   Physical Exam Vitals and nursing note reviewed.  Constitutional:      General: He is in acute distress.     Appearance: He is well-developed. He is ill-appearing.     Comments: Lethargic  HENT:     Head: Normocephalic and atraumatic.     Mouth/Throat:     Mouth: Mucous membranes are dry.  Cardiovascular:     Rate and Rhythm: Normal rate and regular rhythm.     Heart sounds: No murmur.  Pulmonary:     Effort: Pulmonary effort is normal. No respiratory distress.     Breath  sounds: Normal breath sounds.  Abdominal:     Palpations: Abdomen is soft.     Tenderness: There is no abdominal tenderness. There is no guarding or rebound.  Musculoskeletal:        General: No tenderness.  Skin:    General: Skin is warm and dry.  Neurological:     Comments: Lethargic. Arouses to verbal stimuli. Very weakly moves all four extremities.  Psychiatric:     Comments: Unable to assess     ED Results / Procedures / Treatments   Labs (all labs ordered are listed, but only abnormal results are displayed) Labs Reviewed  COMPREHENSIVE METABOLIC PANEL - Abnormal; Notable for the following components:      Result Value   Sodium 133 (*)    Chloride 92 (*)    Glucose, Bld 227 (*)    BUN 65 (*)    Creatinine, Ser 2.65 (*)    Calcium 8.8 (*)    GFR calc non Af Amer 22 (*)    GFR calc Af Amer 26 (*)    Anion gap 16 (*)    All other components within normal limits  CBC WITH DIFFERENTIAL/PLATELET - Abnormal; Notable for the following components:   RBC 3.63 (*)    Hemoglobin 11.2 (*)    HCT 31.7 (*)    RDW 23.4 (*)    Platelets 44 (*)    nRBC 1.2 (*)    Lymphs Abs 0.3 (*)    All other components within normal limits  LACTIC ACID, PLASMA - Abnormal; Notable for the following components:   Lactic Acid, Venous 4.1 (*)    All other components within normal limits  I-STAT CHEM 8, ED - Abnormal; Notable for the following components:   Sodium 131 (*)    Chloride 95 (*)    BUN 51 (*)    Creatinine, Ser 2.70 (*)    Glucose, Bld 213 (*)    Calcium, Ion 1.12 (*)    Hemoglobin 10.9 (*)    HCT 32.0 (*)    All other components within normal limits  CBG MONITORING, ED - Abnormal; Notable for the following components:   Glucose-Capillary 225 (*)    All other components within normal limits  URINE CULTURE  SARS CORONAVIRUS 2 BY RT PCR (HOSPITAL ORDER, [facility])  CULTURE, BLOOD (ROUTINE X 2)  CULTURE, BLOOD (ROUTINE X 2)  LIPASE, BLOOD  ETHANOL  PROTIME-INR  AMMONIA  LACTIC ACID, PLASMA  URINALYSIS, ROUTINE W REFLEX MICROSCOPIC  BLOOD GAS, VENOUS    EKG EKG Interpretation  Date/Time:  Friday May 28 2019 15:37:37 EDT Ventricular Rate:  81 PR Interval:    QRS Duration: 115 QT Interval:  404 QTC Calculation: 469 R Axis:   -4 Text Interpretation: Sinus rhythm Probable left atrial enlargement Left ventricular hypertrophy Nonspecific T abnormalities, lateral leads No significant change since last tracing Confirmed by Quintella Reichert [phone]) on 05/08/2019 3:40:49 PM   Radiology CT Head Wo Contrast  Result Date: 05/24/2019 CLINICAL DATA:  Unresponsive, failure to thrive, multiple myeloma EXAM: CT HEAD WITHOUT CONTRAST TECHNIQUE: Contiguous axial images were obtained from the base of the skull through the vertex without intravenous contrast. COMPARISON:  None. FINDINGS: Brain: There is scattered hypodensities throughout the periventricular white matter consistent with age-indeterminate small vessel ischemic changes. No other signs of acute infarct or hemorrhage. Lateral ventricles and midline structures are unremarkable. No acute extra-axial fluid collections. No mass effect. Vascular: No hyperdense vessel or unexpected calcification. Skull: Normal. Negative for fracture or focal lesion. Sinuses/Orbits: No acute finding. Other: None. IMPRESSION: 1. Age-indeterminate small vessel ischemic changes throughout the periventricular white matter. No acute hemorrhage. Electronically Signed   By: Randa Ngo M.D.   On: 05/26/2019 17:04   DG Chest Port 1 View  Result Date: 05/23/2019 CLINICAL DATA:  Altered mental status. EXAM: PORTABLE CHEST 1 VIEW COMPARISON:  April 18, 2019 FINDINGS: There is stable right-sided venous Port-A-Cath positioning. Mildly decreased lung volumes are seen which is likely secondary to suboptimal patient inspiration. There is no evidence of acute infiltrate, pleural effusion or pneumothorax. The heart size and  mediastinal contours are within normal limits. The visualized skeletal structures are unremarkable. IMPRESSION: No active disease. Electronically Signed   By: Virgina Norfolk M.D.   On: 05/21/2019 16:41    Procedures Procedures (including critical care time) CRITICAL CARE Performed by: Quintella Reichert   Total critical care time: 45 minutes  Critical care time was exclusive of separately billable procedures and treating other patients.  Critical care was necessary to treat or prevent imminent or life-threatening deterioration.  Critical care was time spent personally by me on the following activities: development of treatment plan with patient and/or surrogate as well as nursing, discussions with consultants, evaluation of patient's response to treatment, examination of patient, obtaining history from patient or surrogate, ordering and performing treatments and interventions, ordering and review of laboratory studies, ordering and review of radiographic studies, pulse oximetry and re-evaluation of patient's condition.  Medications Ordered in ED Medications  ceFEPIme (MAXIPIME) 2 g in sodium chloride 0.9 % 100 mL IVPB (2 g Intravenous New Bag/Given 05/20/2019 1706)  sodium chloride 0.9 % bolus 1,000 mL (0 mLs Intravenous Stopped 06/03/2019 1705)  sodium chloride 0.9 % bolus 1,794 mL (1,794 mLs Intravenous New Bag/Given 05/31/2019 1707)    ED Course  I have reviewed the triage vital signs and the nursing notes.  Pertinent labs & imaging results that were available during my care of the patient were reviewed by me and considered in my medical decision making (see chart for details).    MDM Rules/Calculators/A&P                     Patient with multiple myeloma here for evaluation of altered mental status since yesterday. He is ill appearing on evaluation with lethargy and generalized weakness. Labs with AKI, he was treated with aggressive IV fluid hydration. Lactic  acid is elevated, but there is  no clear source of infection. Given his immunocompromised state he was treated with empiric antibiotics pending further workup. Lactic acidosis is most likely secondary to dehydration. CT head without acute abnormality. On repeat assessment during his ED stay patient is more awake and alert but does continue to be generally week and confused. Hospitalist consulted for admission for altered mental status, AKI.    Final Clinical Impression(s) / ED Diagnoses Final diagnoses:  Altered mental status, unspecified altered mental status type  Dehydration  AKI (acute kidney injury) [facility])    Rx / DC Orders ED Discharge Orders    None       Quintella Reichert, MD 06/06/2019 1737

## 2019-05-28 NOTE — ED Notes (Signed)
Pt transported to CT ?

## 2019-05-28 NOTE — Plan of Care (Signed)
  Problem: Education: Goal: Knowledge of General Education information will improve Description: Including pain rating scale, medication(s)/side effects and non-pharmacologic comfort measures Outcome: Not Progressing   Problem: Nutrition: Goal: Adequate nutrition will be maintained Outcome: Not Progressing   Problem: Pain Managment: Goal: General experience of comfort will improve Outcome: Progressing   Problem: Safety: Goal: Ability to remain free from injury will improve Outcome: Progressing

## 2019-05-28 NOTE — ED Notes (Signed)
Pt and all belongings transported upstairs.  

## 2019-05-29 DIAGNOSIS — R4182 Altered mental status, unspecified: Secondary | ICD-10-CM

## 2019-05-29 DIAGNOSIS — D696 Thrombocytopenia, unspecified: Secondary | ICD-10-CM

## 2019-05-29 DIAGNOSIS — E872 Acidosis: Secondary | ICD-10-CM

## 2019-05-29 DIAGNOSIS — D649 Anemia, unspecified: Secondary | ICD-10-CM

## 2019-05-29 DIAGNOSIS — C9002 Multiple myeloma in relapse: Secondary | ICD-10-CM

## 2019-05-29 LAB — BASIC METABOLIC PANEL
Anion gap: 9 (ref 5–15)
BUN: 48 mg/dL — ABNORMAL HIGH (ref 8–23)
CO2: 19 mmol/L — ABNORMAL LOW (ref 22–32)
Calcium: 7.5 mg/dL — ABNORMAL LOW (ref 8.9–10.3)
Chloride: 109 mmol/L (ref 98–111)
Creatinine, Ser: 1.78 mg/dL — ABNORMAL HIGH (ref 0.61–1.24)
GFR calc Af Amer: 42 mL/min — ABNORMAL LOW (ref >60)
GFR calc non Af Amer: 36 mL/min — ABNORMAL LOW (ref >60)
Glucose, Bld: 181 mg/dL — ABNORMAL HIGH (ref 70–99)
Potassium: 3.5 mmol/L (ref 3.5–5.1)
Sodium: 137 mmol/L (ref 135–145)

## 2019-05-29 LAB — URINALYSIS, ROUTINE W REFLEX MICROSCOPIC
Bacteria, UA: NONE SEEN
Bilirubin Urine: NEGATIVE
Glucose, UA: 150 mg/dL — AB
Ketones, ur: NEGATIVE mg/dL
Leukocytes,Ua: NEGATIVE
Nitrite: NEGATIVE
Protein, ur: NEGATIVE mg/dL
Specific Gravity, Urine: 1.012 (ref 1.005–1.030)
pH: 7 (ref 5.0–8.0)

## 2019-05-29 LAB — PROTEIN / CREATININE RATIO, URINE
Creatinine, Urine: 35.97 mg/dL
Protein Creatinine Ratio: 0.61 mg/mg{Cre} — ABNORMAL HIGH (ref 0.00–0.15)
Total Protein, Urine: 22 mg/dL

## 2019-05-29 LAB — CBC
HCT: 26.1 % — ABNORMAL LOW (ref 39.0–52.0)
Hemoglobin: 8.7 g/dL — ABNORMAL LOW (ref 13.0–17.0)
MCH: 29.8 pg (ref 26.0–34.0)
MCHC: 33.3 g/dL (ref 30.0–36.0)
MCV: 89.4 fL (ref 80.0–100.0)
Platelets: 34 10*3/uL — ABNORMAL LOW (ref 150–400)
RBC: 2.92 MIL/uL — ABNORMAL LOW (ref 4.22–5.81)
RDW: 23.9 % — ABNORMAL HIGH (ref 11.5–15.5)
WBC: 8.8 10*3/uL (ref 4.0–10.5)
nRBC: 0.6 % — ABNORMAL HIGH (ref 0.0–0.2)

## 2019-05-29 LAB — NA AND K (SODIUM & POTASSIUM), RAND UR
Potassium Urine: 20 mmol/L
Sodium, Ur: 106 mmol/L

## 2019-05-29 LAB — OSMOLALITY, URINE: Osmolality, Ur: 456 mOsm/kg (ref 300–900)

## 2019-05-29 MED ORDER — SODIUM CHLORIDE 0.9% FLUSH: 10.0000 mL | INTRAVENOUS | Status: DC | PRN

## 2019-05-29 MED ORDER — SODIUM CHLORIDE 0.9% FLUSH
10.0000 mL | Freq: Two times a day (BID) | INTRAVENOUS | Status: DC
  Administered 2019-05-29 – 2019-06-03 (×12): 10 mL

## 2019-05-29 MED ORDER — LEVOTHYROXINE SODIUM 100 MCG/5ML IV SOLN
37.5000 ug | Freq: Every day | INTRAVENOUS | Status: DC
  Administered 2019-05-29 – 2019-06-05 (×7): 37.5 ug via INTRAVENOUS
  Filled 2019-05-29 (×8): qty 5

## 2019-05-29 NOTE — Progress Notes (Signed)
PROGRESS NOTE    [name]  HTD:[id number] DOB: 09/29/1945 DOA: 05/14/2019 PCP: Lucianne Lei, MD   Chef Complaints:Unresponsiveness.  Brief Narrative:74 y.o. male with medical history significant for multiple myeloma in relapse currently on chemotherapy-dexamethasone 16 mg every Tuesday and Thursday and selinexor at 60 mg every Tuesday and Thursday followed by Dr. 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  Patient unable to provide history and presentation.  As per family patient developed significant nausea and vomiting after taking "stomach pills" yesterday afternoon and became less responsive and since then has been intermittently unresponsive sometimes walking up, not been eating well for the past 2 days.  No reported fever, chest pain, nausea, vomiting, fever, chills,, focal weakness, dysuria.  In ED:On presentation patient was minimally responsive, vitals with a stable blood pressure saturating well on room air, temperature 97.4, blood work showed hyponatremia, worsening renal failure lactic acid is 4.1, hemoglobin 10.9 g, platelet 44,000.  Patient was given 1800 mL of fluid about to finish his bolus, since starting fluid patient has perked up is much more alert awake oriented knows he is in the hospital. When I saw his wife is at the bedside helping with history.  Last x-ray and CT head no acute findings. Repeat lactic acid has been sent, UA VBG, Covid screening pending. Admission was requested for further management.  Subjective: Has been lethargic, weak overnight On mitten Lethargic- but after callign few times- answered his name " Get me out of here man" Told me his name, DOB. On R, BP stable, making urine and had Big BM per RN mixed- solid/liquid.  Assessment & Plan:  Acute metabolic  encephalopathy likely due to severe dehydration, nonfocal on exam, CT brain no acute finding.  No obvious infection on chest x-ray, UA came back WBC 0-5, leukocyte and nitrites are negative.  Blood culture pending. Still confused this am.  Keep n.p.o. until more alert awake and safe to take p.o.  Keep him hydrated IV fluids.  Nausea and vomiting at home continue Protonix symptomatic management.  Reportedly started after taking "stomach pills" and patient is on high-dose steroids Tuesday and Thursday.  Severe dehydration, due to poor intake nausea vomiting:  AKI on CKD stage IIIa:ckd from from MM baseline creatinine 1.2-1.6, bun IN 10s in 04/2019, slowly worsening recently  1.7-2.2.  Elevated BUN/creatinine on admission now improving with IV fluids continue gentle hydration given significantly elevated BUN. Recent Labs  Lab 05/25/19 1049 06/04/2019 1531 05/26/2019 1554 05/29/19 0248  BUN 33* 65* 51* 48*  CREATININE 1.79* 2.65* 2.70* 1.78*   Lactic acidosis due to severe dehydration improving on IV fluids.  Bicarb at 19.  Monitor.  He clearly is started on IV antibiotics will de-escalate/stop once blood culture negative.  Hypotonic hyponatremia sodium improved with fluids.  Pancytopenia/anemia in the setting of multiple myeloma, monitor closely avoid heparin product due to low platelet count.  Platelet low at 34K.  Monitor closely hemoglobin at 8.7 g.  Consulted oncology and discussed.  Multiple yeloma in relapse currently on chemotherapy-dexamethasone 16 mg every Tuesday and Thursday and selinexor at 60 mg every Tuesday and Thursday followed by Dr. Alvy Bimler. I have consulted Oncology team.  Hypothyroidism: Euthyroid TSH 0.6.  Continue home Synthroid.   DVT prophylaxis:SCD Code Status: DNR after discussion with the patient and his wife.  Confirmed with patient's wife.  I did updated  patient's son about his wishes and they are going to discuss again. Family Communication: plan of care discussed  with patient's wife over the phone, also revisited patient and updated patient's wife at the bedside.  I called patient's son's and spoke 2 times.  Unable to reach patient's daughter over the phone.  Status is: Inpatient Remains inpatient appropriate because:Persistent severe electrolyte disturbances, IV treatments appropriate due to intensity of illness or inability to take PO and Inpatient level of care appropriate due to severity of illness  Dispo: The patient is from: Home              Anticipated d/c is to: TBD              Anticipated d/c date is: 2 days              Patient currently is not medically stable to d/c. Nutrition: Diet Order            Diet NPO time specified  Diet effective now             Body mass index is 17.28 kg/m.  Consultants:see note  Procedures:see note Microbiology:see note  Medications: Scheduled Meds: . allopurinol  100 mg Oral BID  . aspirin EC  81 mg Oral Daily  . brimonidine  1 drop Right Eye TID  . Chlorhexidine Gluconate Cloth  6 each Topical Daily  . dorzolamide-timolol  1 drop Right Eye BID  . fluorometholone  1 drop Right Eye Daily  . latanoprost  1 drop Right Eye QHS  . levothyroxine  75 mcg Oral Q0600  . mouth rinse  15 mL Mouth Rinse BID  . pantoprazole (PROTONIX) IV  40 mg Intravenous Q24H  . sodium chloride flush  10-40 mL Intracatheter Q12H   Continuous Infusions: . ceFEPime (MAXIPIME) IV    . dextrose 5 % and 0.9% NaCl 125 mL/hr at 05/29/19 0354    Antimicrobials: Anti-infectives (From admission, onward)   Start     Dose/Rate Route Frequency Ordered Stop   05/29/19 1600  ceFEPIme (MAXIPIME) 2 g in sodium chloride 0.9 % 100 mL IVPB     2 g 200 mL/hr over 30 Minutes Intravenous Every 24 hours 05/14/2019 1916     05/18/2019 1630  ceFEPIme (MAXIPIME) 2 g in sodium chloride 0.9 % 100 mL IVPB     2 g 200 mL/hr over 30 Minutes Intravenous  Once 05/10/2019 1619 05/14/2019 1803       Objective: Vitals: Today's Vitals   05/29/19  0300 05/29/19 0400 05/29/19 0500 05/29/19 0700  BP: (!) 142/80 117/71 106/66 (!) 150/78  Pulse: 73 77 70   Resp:  10 (!) 9 11  Temp:   98.1 F (36.7 C) 97.8 F (36.6 C)  TempSrc:   Oral Axillary  SpO2: 100% 97% 95% 96%  Weight:      Height:      PainSc:        Intake/Output Summary (Last 24 hours) at 05/29/2019 0911 Last data filed at 05/29/2019 0538 Gross per 24 hour  Intake 2278.11 ml  Output 400 ml  Net 1878.11 ml   Filed Weights   05/30/2019 1831  Weight: 56.2 kg   Weight change:    Intake/Output from previous day: 05/21 0701 - 05/22 0700 In: 2278.1 [I.V.:1178.1; IV Piggyback:1100] Out: 400 [Urine:400] Intake/Output this shift: No intake/output data recorded.  Examination:  General exam: weak, frail, lethargic, able to tell his name. HEENT:Oral mucosadry, Ear/Nose WNL grossly,dentition normal. Respiratory system:  bilaterally clear,no wheezing or crackles,no use of accessory muscle, non tender. Cardiovascular system: S1 & S2 +, regular, No JVD. Gastrointestinal system: Abdomen soft, NT,ND, BS+. Nervous System: follows some commands, able to tell his name responds to pai Extremities: No edema, distal peripheral pulses palpable.  Skin: No rashes,no icterus. MSK: Normal muscle bulk,tone, power  Data Reviewed: I have personally reviewed following labs and imaging studies CBC: Recent Labs  Lab 05/25/19 1049 05/25/2019 1531 05/14/2019 1554 05/29/19 0248  WBC 11.9* 8.2  --  8.8  NEUTROABS 10.2* 7.7  --   --   HGB 10.3* 11.2* 10.9* 8.7*  HCT 30.3* 31.7* 32.0* 26.1*  MCV 87.3 87.3  --  89.4  PLT 75* 44*  --  34*   Basic Metabolic Panel: Recent Labs  Lab 05/25/19 1049 05/30/2019 1531 05/13/2019 1554 05/29/19 0248  NA 128* 133* 131* 137  K 4.6 4.0 3.7 3.5  CL 96* 92* 95* 109  CO2 23 25  --  19*  GLUCOSE 122* 227* 213* 181*  BUN 33* 65* 51* 48*  CREATININE 1.79* 2.65* 2.70* 1.78*  CALCIUM 9.0 8.8*  --  7.5*  MG 1.7  --   --   --    GFR: Estimated Creatinine  Clearance: 28.1 mL/min (A) (by C-G formula based on SCr of 1.78 mg/dL (H)). Liver Function Tests: Recent Labs  Lab 05/25/19 1049 05/10/2019 1531  AST 12* 15  ALT 29 24  ALKPHOS 66 57  BILITOT 1.0 1.2  PROT 6.7 6.7  ALBUMIN 3.6 3.6   Recent Labs  Lab 05/18/2019 1531  LIPASE 35   Recent Labs  Lab 06/04/2019 1531  AMMONIA 14   Coagulation Profile: Recent Labs  Lab 05/14/2019 1531  INR 1.2   Cardiac Enzymes: No results for input(s): CKTOTAL, CKMB, CKMBINDEX, TROPONINI in the last 168 hours. BNP (last 3 results) No results for input(s): PROBNP in the last 8760 hours. HbA1C: No results for input(s): HGBA1C in the last 72 hours. CBG: Recent Labs  Lab 06/02/2019 1525  GLUCAP 225*   Lipid Profile: No results for input(s): CHOL, HDL, LDLCALC, TRIG, CHOLHDL, LDLDIRECT in the last 72 hours. Thyroid Function Tests: Recent Labs    05/24/2019 1850  TSH 0.606   Anemia Panel: No results for input(s): VITAMINB12, FOLATE, FERRITIN, TIBC, IRON, RETICCTPCT in the last 72 hours. Sepsis Labs: Recent Labs  Lab 05/27/2019 1531 05/18/2019 1727 05/23/2019 2300  LATICACIDVEN 4.1* 3.1* 2.3*    Recent Results (from the past 240 hour(s))  SARS Coronavirus 2 by RT PCR (hospital order, performed in [facility] hospital lab) Nasopharyngeal Nasopharyngeal Swab     Status: None   Collection Time: 06/07/2019  4:12 PM   Specimen: Nasopharyngeal Swab  Result Value Ref Range Status   SARS Coronavirus 2 NEGATIVE NEGATIVE Final    Comment: (NOTE) SARS-CoV-2 target nucleic acids are NOT DETECTED. The SARS-CoV-2 RNA is generally detectable in upper and lower respiratory specimens during the acute phase of infection. The lowest concentration of SARS-CoV-2 viral copies this assay can detect is 250 copies / mL. A negative result does not preclude SARS-CoV-2 infection and should not be used as the sole basis for treatment or other patient management decisions.  A negative result may occur with improper  specimen collection / handling, submission of specimen other than nasopharyngeal swab, presence of viral mutation(s) within the areas targeted by this assay, and inadequate number of viral copies (<250 copies / mL). A negative result must be combined with clinical observations, patient  history, and epidemiological information. Fact Sheet for Patients:   StrictlyIdeas.no Fact Sheet for Healthcare Providers: BankingDealers.co.za This test is not yet approved or cleared  by the [place] FDA and has been authorized for detection and/or diagnosis of SARS-CoV-2 by FDA under an Emergency Use Authorization (EUA).  This EUA will remain in effect (meaning this test can be used) for the duration of the COVID-19 declaration under Section 564(b)(1) of the Act, 21 U.S.C. section 360bbb-3(b)(1), unless the authorization is terminated or revoked sooner. Performed at [facility], [place] [address] [place], [place] [address]   Culture, blood (routine x 2)     Status: None (Preliminary result)   Collection Time: 05/20/2019  4:18 PM   Specimen: BLOOD  Result Value Ref Range Status   Specimen Description   Final    BLOOD SITE NOT SPECIFIED Performed at [facility] [address], [place], [place] [address]    Special Requests   Final    BOTTLES DRAWN AEROBIC AND ANAEROBIC Blood Culture adequate volume Performed at [facility] [address] [place], [place] [address]    Culture   Final    NO GROWTH < 12 HOURS Performed at [facility] [address], [place], [place] [address]    Report Status PENDING  Incomplete  MRSA PCR Screening     Status: None   Collection Time: 05/25/2019  6:25 PM   Specimen: Nasal Mucosa; Nasopharyngeal  Result Value Ref Range Status   MRSA by PCR NEGATIVE NEGATIVE Final    Comment:        The GeneXpert MRSA Assay (FDA approved for NASAL specimens only),  is one component of a comprehensive MRSA colonization surveillance program. It is not intended to diagnose MRSA infection nor to guide or monitor treatment for MRSA infections. Performed at [facility], [place] [address], [place], [place] [address]       Radiology Studies: CT Head Wo Contrast  Result Date: 05/21/2019 CLINICAL DATA:  Unresponsive, failure to thrive, multiple myeloma EXAM: CT HEAD WITHOUT CONTRAST TECHNIQUE: Contiguous axial images were obtained from the base of the skull through the vertex without intravenous contrast. COMPARISON:  None. FINDINGS: Brain: There is scattered hypodensities throughout the periventricular white matter consistent with age-indeterminate small vessel ischemic changes. No other signs of acute infarct or hemorrhage. Lateral ventricles and midline structures are unremarkable. No acute extra-axial fluid collections. No mass effect. Vascular: No hyperdense vessel or unexpected calcification. Skull: Normal. Negative for fracture or focal lesion. Sinuses/Orbits: No acute finding. Other: None. IMPRESSION: 1. Age-indeterminate small vessel ischemic changes throughout the periventricular white matter. No acute hemorrhage. Electronically Signed   By: Randa Ngo M.D.   On: 05/16/2019 17:04   DG Chest Port 1 View  Result Date: 05/23/2019 CLINICAL DATA:  Altered mental status. EXAM: PORTABLE CHEST 1 VIEW COMPARISON:  April 18, 2019 FINDINGS: There is stable right-sided venous Port-A-Cath positioning. Mildly decreased lung volumes are seen which is likely secondary to suboptimal patient inspiration. There is no evidence of acute infiltrate, pleural effusion or pneumothorax. The heart size and mediastinal contours are within normal limits. The visualized skeletal structures are unremarkable. IMPRESSION: No active disease. Electronically Signed   By: Virgina Norfolk M.D.   On: 05/15/2019 16:41     LOS: 1 day   Antonieta Pert, MD [facility]  05/29/2019, 9:11 AM

## 2019-05-29 NOTE — Consult Note (Signed)
[place] Telephone:([phone]) [phone]   Fax:([phone]) [place] NOTE  Patient Care Team: Lucianne Lei, MD as PCP - General (Family Medicine) Heath Lark, MD as Consulting Physician (Hematology and Oncology) Beverely Pace, LCSW as Social Worker Hotel manager)  Hematological/Oncological History Oncology History Overview Note  Progressed on Elotuzumab, revlimid, Velcade, Daratumumab and Pomalyst   Multiple myeloma in relapse ([place])  12/12/2010 Initial Diagnosis   Multiple myeloma   02/16/2014 Imaging   Skeletal survey showed diffuse osteopenia   03/02/2014 Bone Marrow Biopsy   Accession: [id number] biopsy showed only 5 % plasma cells. However, the biopsy was difficult and the bone was fragmented during the procedure. Cytogenetics and FISH study is normal   03/03/2014 Procedure   he has port placement   03/10/2014 - 05/19/2014 Chemotherapy   he received elotuzumab and revlimid   05/20/2014 - 02/25/2016 Chemotherapy   Treatment is switched to maintenance Revlimid only. Treatment is stopped due to progressive disease   11/15/2014 Adverse Reaction   He has mild worsening anemia. Does of Revlimid reduced to 5 mg 21 days on, 7 days off   03/06/2016 -  Chemotherapy   He received Daratumumab and Velcade. Velcade is stopped on 07/24/16   03/06/2016 - 02/15/2019 Chemotherapy   The patient had daratumumab with Pomalyst for chemotherapy treatment.     08/14/2016 Miscellaneous   Pomalyst is added along with Daratumumab   10/17/2016 Surgery   EGD was performed for coffee-ground emesis. Multiple linear superficial ulcers were noted in the esophagus from 25-35 cm from insertion. Severe esophagitis was noted from 30-35 cm from insertion. Multiple biopsies were obtained. A small hiatal hernia was noted. The gastric cavity contained coffee-ground fluid, after suctioning and lavage, no obvious gastric lesions/erosion/ulceration/erythema was noted. Biopsies were taken  from the antrum to rule out H. Pylori. Duodenum bulb appeared unremarkable. A small diverticulum was noted in second portion of the duodenum.     12/06/2016 Imaging   Ct scan abdomen No acute findings.  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  Stable small hiatal hernia.  Colonic diverticulosis, without radiographic evidence of diverticulitis.      CHIEF COMPLAINTS/PURPOSE OF CONSULTATION:  "Unresponsiveness x 2 days "  HISTORY OF PRESENTING ILLNESS:  Calvin Mccormick 74 y.o. male with medical history significant for multiple myeloma currently on selinexor and dexamethasone under the care of Dr. Alvy Bimler as the [facility]. He is currently admitted after presenting to the Emergency department with 2 days of intermittent nonresponsiveness.   On review of the previous records Calvin Mccormick was brought the the ED on 05/27/2019 by his family due to concerns for intermittent nonresponsiveness over the last 2 days.  Initially brought to the emergency department he was minimally responsive with stable blood pressure and saturating well on room air.  Initial lab evaluation showed white blood cell count 8.2, hemoglobin 11.2, MCV 87.3, and a platelet count of 44.  Electrolytes showed a creatinine of 2.65 (up from 1.79 during his last visit with Dr. Alvy Bimler on 5/18), potassium 4.0, and a calcium of 8.8 with albumin of 3.6.  His lactic acid was elevated at 4.1.  CT imaging of the brain was performed with no evidence of acute hemorrhage or CVA.  Blood cultures and urine cultures were collected and patient was started up on empiric antibiotics.  Other labs for altered mental status including TSH, ethanol, and ammonia, were all within normal limits.  Due to concern for this patient's history of  multiple myeloma hematology service was consulted for further evaluation.  On exam today while he will open his eyes to loud voice, however he does not answer questions appropriately.  He  grumbles and mumbles some answers, but no valuable information is obtained through discussion with him today.  When I asked "how are you doing" he responded "how are you doing?".  He periodically opens his eyes throughout the encounter.  An ROS is not possible given his current mental status.  Called his wife to obtain more information. Nausea/vomiting x 2-3 days prior to admission.  He also notes that he was having staring spells approximately 1 week before admission.  She denies him having any other infectious symptoms, or any other signs or symptoms that would be revealing for possible etiology.  MEDICAL HISTORY:  Past Medical History:  Diagnosis Date  . Anemia 11/27/2010  . Cancer ([place])   . Chronic renal insufficiency, stage III (moderate) 06/17/2011   Due to myeloma & HTN  . CKD (chronic kidney disease), stage III 02/05/2011  . 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 Diarrhea 06/17/2011   Hospital admission 05/30/11 velcade toxicity? Infectious?  Marland Kitchen Hyperlipemia   . Hypertension, benign essential, goal below 140/90 11/27/2010  . Hypokalemia with normal acid-base balance 07/29/2011  . Hypomagnesemia 07/29/2011  . Seasonal allergies     SURGICAL HISTORY: Past Surgical History:  Procedure Laterality Date  . COLONOSCOPY  07/26/2011   Procedure: COLONOSCOPY;  Surgeon: Cleotis Nipper, MD;  Location: [facility];  Service: Endoscopy;  Laterality: N/A;  . ESOPHAGOGASTRODUODENOSCOPY (EGD) WITH PROPOFOL Left 10/17/2016   Procedure: ESOPHAGOGASTRODUODENOSCOPY (EGD) WITH PROPOFOL;  Surgeon: Ronnette Juniper, MD;  Location: [facility];  Service: Gastroenterology;  Laterality: Left;  . EYE SURGERY     bilateral cataract  surgery  . MULTIPLE EXTRACTIONS WITH ALVEOLOPLASTY N/A 05/27/2014   Procedure: Extraction of tooth #'s 1,6,7,8,10,11,13,21,22,23,24,25,26,27 and 28 with alveoloplasty;  Surgeon: Lenn Cal, DDS;  Location: [facility] ORS;  Service: Oral Surgery;  Laterality: N/A;  . porta cath      for  chemotherapy- right chest area    SOCIAL HISTORY: Social History   Socioeconomic History  . Marital status: Married    Spouse name: Not on file  . Number of children: Not on file  . Years of education: Not on file  . Highest education level: Not on file  Occupational History  . Not on file  Tobacco Use  . Smoking status: Never Smoker  . Smokeless tobacco: Never Used  Substance and Sexual Activity  . Alcohol use: No  . Drug use: No  . Sexual activity: Not Currently  Other Topics Concern  . Not on file  Social History Narrative  . Not on file   Social Determinants of Health   Financial Resource Strain:   . Difficulty of Paying Living Expenses:   Food Insecurity:   . Worried About Charity fundraiser in the Last Year:   . Arboriculturist in the Last Year:   Transportation Needs:   . Film/video editor (Medical):   Marland Kitchen Lack of Transportation (Non-Medical):   Physical Activity:   . Days of Exercise per Week:   . Minutes of Exercise per Session:   Stress:   . Feeling of Stress :   Social Connections:   . Frequency of Communication with Friends and Family:   . Frequency of Social Gatherings with Friends and Family:   . Attends Religious Services:   . Active Member of Clubs or  Organizations:   . Attends Archivist Meetings:   Marland Kitchen Marital Status:   Intimate Partner Violence:   . Fear of Current or Ex-Partner:   . Emotionally Abused:   Marland Kitchen Physically Abused:   . Sexually Abused:     FAMILY HISTORY: Family History  Problem Relation Age of Onset  . Cancer Brother        lung ca    ALLERGIES:  has No Known Allergies.  MEDICATIONS:  Current Facility-Administered Medications  Medication Dose Route Frequency Provider Last Rate Last Admin  . acetaminophen (TYLENOL) tablet 650 mg  650 mg Oral Q6H PRN Kc, Maren Beach, MD       Or  . acetaminophen (TYLENOL) suppository 650 mg  650 mg Rectal Q6H PRN Kc, Ramesh, MD      . albuterol (PROVENTIL) (2.5 MG/3ML) 0.083%  nebulizer solution 2.5 mg  2.5 mg Nebulization Q2H PRN Kc, Ramesh, MD      . allopurinol (ZYLOPRIM) tablet 100 mg  100 mg Oral BID Kc, Ramesh, MD      . aspirin EC tablet 81 mg  81 mg Oral Daily Kc, Ramesh, MD      . brimonidine (ALPHAGAN) 0.2 % ophthalmic solution 1 drop  1 drop Right Eye TID Antonieta Pert, MD   1 drop at 05/29/19 1003  . ceFEPIme (MAXIPIME) 2 g in sodium chloride 0.9 % 100 mL IVPB  2 g Intravenous Q24H Luiz Ochoa, RPH      . Chlorhexidine Gluconate Cloth 2 % PADS 6 each  6 each Topical Daily Antonieta Pert, MD   6 each at 05/29/19 1003  . dextrose 5 %-0.9 % sodium chloride infusion   Intravenous Continuous Antonieta Pert, MD 125 mL/hr at 05/29/19 1223 New Bag at 05/29/19 1223  . dorzolamide-timolol (COSOPT) 22.3-6.8 MG/ML ophthalmic solution 1 drop  1 drop Right Eye BID Antonieta Pert, MD   1 drop at 05/29/19 1004  . fluorometholone (FML) 0.1 % ophthalmic suspension 1 drop  1 drop Right Eye Daily Antonieta Pert, MD   1 drop at 05/29/19 1004  . latanoprost (XALATAN) 0.005 % ophthalmic solution 1 drop  1 drop Right Eye QHS Kc, Ramesh, MD      . levothyroxine (SYNTHROID, LEVOTHROID) injection 37.5 mcg  37.5 mcg Intravenous Daily Kc, Ramesh, MD   37.5 mcg at 05/29/19 1223  . MEDLINE mouth rinse  15 mL Mouth Rinse BID Kc, Ramesh, MD   15 mL at 05/29/19 1005  . ondansetron (ZOFRAN) tablet 4 mg  4 mg Oral Q6H PRN Kc, Ramesh, MD       Or  . ondansetron (ZOFRAN) injection 4 mg  4 mg Intravenous Q6H PRN Kc, Ramesh, MD      . pantoprazole (PROTONIX) injection 40 mg  40 mg Intravenous Q24H Kc, Ramesh, MD   40 mg at 05/14/2019 2244  . sodium chloride flush (NS) 0.9 % injection 10-40 mL  10-40 mL Intracatheter Q12H Kc, Ramesh, MD   10 mL at 05/29/19 1005  . sodium chloride flush (NS) 0.9 % injection 10-40 mL  10-40 mL Intracatheter PRN Kc, Ramesh, MD        REVIEW OF SYSTEMS:   Not able to obtain  PHYSICAL EXAMINATION:  Vitals:   05/29/19 1200 05/29/19 1300  BP: 139/75 126/70  Pulse: 66 71  Resp:  16 (!) 24  Temp: 98.2 F (36.8 C)   SpO2: 100% 97%   Filed Weights   06/04/2019 1831  Weight: 123 lb 14.4 oz (56.2  kg)    GENERAL: chronically ill appearing elderly African American male in NAD  SKIN: skin color, texture, turgor are normal, no rashes or significant lesions EYES: conjunctiva are pink and non-injected, sclera clear LUNGS: clear to auscultation and percussion with normal breathing effort HEART: regular rate & rhythm and no murmurs and no lower extremity edema Musculoskeletal: no cyanosis of digits and no clubbing  PSYCH: alert & oriented x 3, fluent speech NEURO: no focal motor/sensory deficits  LABORATORY DATA:  I have reviewed the data as listed CBC Latest Ref Rng & Units 05/29/2019 05/20/2019 05/25/2019  WBC 4.0 - 10.5 K/uL 8.8 - 8.2  Hemoglobin 13.0 - 17.0 g/dL 8.7(L) 10.9(L) 11.2(L)  Hematocrit 39.0 - 52.0 % 26.1(L) 32.0(L) 31.7(L)  Platelets 150 - 400 K/uL 34(L) - 44(L)    CMP Latest Ref Rng & Units 05/29/2019 05/10/2019 06/02/2019  Glucose 70 - 99 mg/dL 181(H) 213(H) 227(H)  BUN 8 - 23 mg/dL 48(H) 51(H) 65(H)  Creatinine 0.61 - 1.24 mg/dL 1.78(H) 2.70(H) 2.65(H)  Sodium 135 - 145 mmol/L 137 131(L) 133(L)  Potassium 3.5 - 5.1 mmol/L 3.5 3.7 4.0  Chloride 98 - 111 mmol/L 109 95(L) 92(L)  CO2 22 - 32 mmol/L 19(L) - 25  Calcium 8.9 - 10.3 mg/dL 7.5(L) - 8.8(L)  Total Protein 6.5 - 8.1 g/dL - - 6.7  Total Bilirubin 0.3 - 1.2 mg/dL - - 1.2  Alkaline Phos 38 - 126 U/L - - 57  AST 15 - 41 U/L - - 15  ALT 0 - 44 U/L - - 24    RADIOGRAPHIC STUDIES: CT Head Wo Contrast  Result Date: 05/23/2019 CLINICAL DATA:  Unresponsive, failure to thrive, multiple myeloma EXAM: CT HEAD WITHOUT CONTRAST TECHNIQUE: Contiguous axial images were obtained from the base of the skull through the vertex without intravenous contrast. COMPARISON:  None. FINDINGS: Brain: There is scattered hypodensities throughout the periventricular white matter consistent with age-indeterminate small vessel  ischemic changes. No other signs of acute infarct or hemorrhage. Lateral ventricles and midline structures are unremarkable. No acute extra-axial fluid collections. No mass effect. Vascular: No hyperdense vessel or unexpected calcification. Skull: Normal. Negative for fracture or focal lesion. Sinuses/Orbits: No acute finding. Other: None. IMPRESSION: 1. Age-indeterminate small vessel ischemic changes throughout the periventricular white matter. No acute hemorrhage. Electronically Signed   By: Randa Ngo M.D.   On: 05/10/2019 17:04   DG Chest Port 1 View  Result Date: 06/06/2019 CLINICAL DATA:  Altered mental status. EXAM: PORTABLE CHEST 1 VIEW COMPARISON:  April 18, 2019 FINDINGS: There is stable right-sided venous Port-A-Cath positioning. Mildly decreased lung volumes are seen which is likely secondary to suboptimal patient inspiration. There is no evidence of acute infiltrate, pleural effusion or pneumothorax. The heart size and mediastinal contours are within normal limits. The visualized skeletal structures are unremarkable. IMPRESSION: No active disease. Electronically Signed   By: Virgina Norfolk M.D.   On: 05/13/2019 16:41    ASSESSMENT & PLAN Calvin Mccormick 74 y.o. male with medical history significant for multiple myeloma currently on selinexor and dexamethasone under the care of Dr. Alvy Bimler as the [facility]. He is currently admitted after presenting to the Emergency department with 2 days of intermittent nonresponsiveness.  A review of the labs and discussion with the patient his findings are most consistent with altered mental status secondary to severe dehydration.  The most likely cause of his dehydration is the reported nausea and vomiting that he was experiencing over the last week.  The reason  for the nausea vomiting he has been experiencing is not entirely clear, though it may be related to his selinexor therapy.    A thorough and detailed work-up for altered mental status has been  performed which included a head CT, infectious work-up, and causes of metabolic encephalopathy.  Fortunately at this time none of these have found a cause for his altered mental status.  The only other issue would be the apparent drop in his hemoglobin from 11.2 on admission down to 8.7 today.  In all likelihood this is due to hemoconcentration at the time of admission with fluids correcting him to his normal baseline hemoglobin level.  I would still recommend close monitoring of his hemoglobin levels to assure they are not dropping further and he does not have any active source of bleed.  #Altered Mental Status #Lactic Acidosis --initial workup has been reassuring, with no evidence of intracranial abnormality, electrolyte disturbance, or infection  --most likely etiology is dehydration 2/2 to nausea/vomiting/diarrhea. His labs are correcting with hydration.  --agree with empiric abx until his blood and urine cultures return negative --continue to monitor   #Refractory Multiple Myeloma --patient not due for his next dose of [name] until Tuesday.  --this medication is not associated with altered mental status, though may be contributing to his nausea/vomiting leading to dehydration --last MM labs ordered on 05/18/2019 are reassuring, appear to show response to therapy with declined in [facility] and M protein.  --will bring this to the attention of his primary oncologist Dr. Alvy Bimler   #Normocytic Anemia #Thrombocytopenia --patient initially presented with Hgb 11.2, with decline to 8.7.  --patient's baseline appears to be approximately 9.0. Elevation to 11.2 is likely hemoconcentration in the setting of dehydration, with return to baseline levels with IVF.  --no indication for platelet transfusion unless Plt <10 or signs of active bleeding.  --continue to monitor to assure no evidence of bleed  All questions were answered. The patient knows to call the clinic with any problems, questions or  concerns.  A total of more than 55 minutes were spent on this encounter and over half of that time was spent on counseling and coordination of care as outlined above.   Ledell Peoples, MD Department of Hematology/Oncology [place] at [facility] Phone: [phone] Pager: [phone] Email: [email or web address]  05/29/2019 1:30 PM

## 2019-05-29 NOTE — Progress Notes (Signed)
Initial Nutrition Assessment  DOCUMENTATION CODES:   Underweight(Suspected PCM)  INTERVENTION:  Monitor for diet advancement, will provided supplements as appropriate  Monitor magnesium, potassium, and phosphorus daily for at least 3 days with diet advancement, MD to replete as needed, as pt is at risk for refeeding syndrome given poor po intake prior to admission    NUTRITION DIAGNOSIS:   Inadequate oral intake related to lethargy/confusion as evidenced by NPO status.    GOAL:   Patient will meet greater than or equal to 90% of their needs    MONITOR:   Diet advancement, Weight trends, Labs, I & O's  REASON FOR ASSESSMENT:   Malnutrition Screening Tool    ASSESSMENT:  RD working remotely.  74 year old male with past medical history significant for multiple myeloma in relapse currently on chemotherapy, followed by Dr. Gorsuch, CKD stage IIIa, chronic diarrhea, HTN, HLD, brought to ED with intermittent unresponsiveness, poor po intake over the past 2 days and admitted for acute metabolic encephalopathy likely due to severe dehydration.  Unable to obtain nutrition history at this time, per notes pt still confused, weak and lethargic overnight. Patient to continue NPO until more alert, awake, and safe to take po. Will continue to monitor for diet advancement and provided nutrition supplements as appropriate. Patient is at risk for refeeding with diet advancement, recommend monitoring potassium, magnesium, and phosphorous.   Current wt 123.64 lb Per history, on 5/11 pt wt 131.12 lb, on 4/20 pt wt 144.1 lb, on 3/22 pt wt 149.6 lb. This indicates a 25.96 lb (17.4%) wt loss in the past 2 months and a 7.48 lb (5.7%) wt loss in 10 days which is significant. Patient is underweight, given recent trends, advanced age, and history of illness expect degree of malnutrition, however unable to identify at this time.  Per notes: -CT brain no acute findings -no obvious infection on  CXR -UA back with WBC 0-5, leukocyte, nitrite negative -Blood culture pending -lactic acidosis and hypotonic hyponatremia improving on IV fluids  I/Os: +1878 ml since admit UOP: 400 ml x 12 hrs Medications reviewed and include: IVPB Maxipime IVF D5 NaCl @125 ml Labs reviewed   NUTRITION - FOCUSED PHYSICAL EXAM: Unable to complete at this time, RD working remotely.    Diet Order:   Diet Order            Diet NPO time specified  Diet effective now              EDUCATION NEEDS:   Not appropriate for education at this time  Skin:  Skin Assessment: Reviewed RN Assessment  Last BM:  5/22  Height:   Ht Readings from Last 1 Encounters:  05/16/2019 5' 11" (1.803 m)    Weight:   Wt Readings from Last 1 Encounters:  05/16/2019 56.2 kg    BMI:  Body mass index is 17.28 kg/m.  Estimated Nutritional Needs:   Kcal:  1686-1967  Protein:  73-79  Fluid:  >/= 1.6/day  Suzanne Clayton, RD, LDN Clinical Nutrition After Hours/Weekend Pager # in Amion  

## 2019-05-30 DIAGNOSIS — Z515 Encounter for palliative care: Secondary | ICD-10-CM

## 2019-05-30 DIAGNOSIS — G9340 Encephalopathy, unspecified: Secondary | ICD-10-CM

## 2019-05-30 LAB — C DIFFICILE QUICK SCREEN W PCR REFLEX
C Diff antigen: NEGATIVE
C Diff interpretation: NOT DETECTED
C Diff toxin: NEGATIVE

## 2019-05-30 LAB — URINE CULTURE: Culture: NO GROWTH

## 2019-05-30 LAB — COMPREHENSIVE METABOLIC PANEL
ALT: 25 U/L (ref 0–44)
AST: 19 U/L (ref 15–41)
Albumin: 2.5 g/dL — ABNORMAL LOW (ref 3.5–5.0)
Alkaline Phosphatase: 49 U/L (ref 38–126)
Anion gap: 5 (ref 5–15)
BUN: 24 mg/dL — ABNORMAL HIGH (ref 8–23)
CO2: 22 mmol/L (ref 22–32)
Calcium: 7.3 mg/dL — ABNORMAL LOW (ref 8.9–10.3)
Chloride: 113 mmol/L — ABNORMAL HIGH (ref 98–111)
Creatinine, Ser: 1.41 mg/dL — ABNORMAL HIGH (ref 0.61–1.24)
GFR calc Af Amer: 56 mL/min — ABNORMAL LOW (ref >60)
GFR calc non Af Amer: 48 mL/min — ABNORMAL LOW (ref >60)
Glucose, Bld: 149 mg/dL — ABNORMAL HIGH (ref 70–99)
Potassium: 2.4 mmol/L — CL (ref 3.5–5.1)
Sodium: 140 mmol/L (ref 135–145)
Total Bilirubin: 0.7 mg/dL (ref 0.3–1.2)
Total Protein: 5.2 g/dL — ABNORMAL LOW (ref 6.5–8.1)

## 2019-05-30 LAB — CBC
HCT: 23 % — ABNORMAL LOW (ref 39.0–52.0)
Hemoglobin: 7.7 g/dL — ABNORMAL LOW (ref 13.0–17.0)
MCH: 30.3 pg (ref 26.0–34.0)
MCHC: 33.5 g/dL (ref 30.0–36.0)
MCV: 90.6 fL (ref 80.0–100.0)
Platelets: 24 10*3/uL — CL (ref 150–400)
RBC: 2.54 MIL/uL — ABNORMAL LOW (ref 4.22–5.81)
RDW: 23.8 % — ABNORMAL HIGH (ref 11.5–15.5)
WBC: 8.6 10*3/uL (ref 4.0–10.5)
nRBC: 0 % (ref 0.0–0.2)

## 2019-05-30 LAB — MAGNESIUM: Magnesium: 1.5 mg/dL — ABNORMAL LOW (ref 1.7–2.4)

## 2019-05-30 LAB — GLUCOSE, CAPILLARY: Glucose-Capillary: 128 mg/dL — ABNORMAL HIGH (ref 70–99)

## 2019-05-30 LAB — POTASSIUM: Potassium: 2.9 mmol/L — ABNORMAL LOW (ref 3.5–5.1)

## 2019-05-30 MED ORDER — FAMOTIDINE IN NACL 20-0.9 MG/50ML-% IV SOLN
20.0000 mg | INTRAVENOUS | Status: DC
  Administered 2019-05-31 – 2019-06-05 (×5): 20 mg via INTRAVENOUS
  Filled 2019-05-30 (×5): qty 50

## 2019-05-30 MED ORDER — POTASSIUM CHLORIDE 10 MEQ/100ML IV SOLN: 10.0000 meq | INTRAVENOUS | Status: DC

## 2019-05-30 MED ORDER — HYDRALAZINE HCL 20 MG/ML IJ SOLN
5.0000 mg | Freq: Once | INTRAMUSCULAR | Status: AC
  Administered 2019-05-30: 5 mg via INTRAVENOUS
  Filled 2019-05-30: qty 1

## 2019-05-30 MED ORDER — POTASSIUM CHLORIDE 10 MEQ/100ML IV SOLN
10.0000 meq | INTRAVENOUS | Status: AC
  Administered 2019-05-30 (×6): 10 meq via INTRAVENOUS
  Filled 2019-05-30: qty 100

## 2019-05-30 MED ORDER — MAGNESIUM SULFATE 2 GM/50ML IV SOLN
2.0000 g | Freq: Once | INTRAVENOUS | Status: AC
  Administered 2019-05-30: 2 g via INTRAVENOUS
  Filled 2019-05-30: qty 50

## 2019-05-30 NOTE — Progress Notes (Signed)
PROGRESS NOTE    DORTHY HUSTEAD  ZOX:[id number] DOB: Oct 03, 1945 DOA: 06/01/2019 PCP: Lucianne Lei, MD   Chef Complaints: unresponsiveness.  Brief Narrative:74 y.o. male with medical history significant for multiple myeloma in relapse currently on chemotherapy-dexamethasone 16 mg every Tuesday and Thursday and selinexor at 60 mg every Tuesday and Thursday followed by Dr. 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  Patient unable to provide history and presentation.  As per family patient developed significant nausea and vomiting after taking "stomach pills" yesterday afternoon and became less responsive and since then has been intermittently unresponsive sometimes walking up, not been eating well for the past 2 days.  No reported fever, chest pain, nausea, vomiting, fever, chills,, focal weakness, dysuria.  In ED:On presentation patient was minimally responsive, vitals with a stable blood pressure saturating well on room air, temperature 97.4, blood work showed hyponatremia, worsening renal failure lactic acid is 4.1, hemoglobin 10.9 g, platelet 44,000.  Patient was given 1800 mL of fluid about to finish his bolus, since starting fluid patient has perked up is much more alert awake oriented knows he is in the hospital. When I saw his wife is at the bedside helping with history.  Last x-ray and CT head no acute findings. Repeat lactic acid has been sent, UA VBG, Covid screening pending. Patient is admitted with empiric antibiotics and aggressive IV fluid hydration.    Subjective:  Afebrile overnight.  Urine output reassuring to 2325 past 24 hours. Patient sleeping, able to wake up intermittently thinks she is at home.  Assessment & Plan:  Acute metabolic encephalopathy likely due to severe dehydration, nonfocal on  exam, CT brain no acute finding.UA and chest x-ray no acute finding. Blood culture so far negative patient has been on empiric antibiotics.N.p.o. until safe for p.o. Suspect uremic symptoms given his elevated BUN.  I discussed with Dr. Rory Percy from neurology and he would like to get MRI brain if he is able to tolerate, ?if it is contributed by [name], and alos get EEG tomorrow. Cont supportive measures and high fluid hydration  Nausea and vomiting could be secondary to his chemo versus uremic symptom: Continue on symptomatic management.  Continue Pepcid hold of Protonix due to thrombocytopenia  Severe dehydration due to #1: Improving on IV fluid hydration.  Output reassuring continue fluids  AKI on CKD stage IIIa:Baseline CKD from multiple myeloma with creatinine 1.2-1.6, bun IN 10s in 04/2019, slowly worsening recently  1.7-2.2. Significantly elevated BUN/creatinine on admission.  Having good urine output.  BUN/creatinine has nicely improved, continue IV fluids.  Recent Labs  Lab 05/25/19 1049 05/24/2019 1531 06/03/2019 1554 05/29/19 0248 05/30/19 0939  BUN 33* 65* 51* 48* 24*  CREATININE 1.79* 2.65* 2.70* 1.78* 1.41*   Hypokalemia severe: potassium very low, will replete aggressively with IV KCl and repeat labs.  Check magnesium. Recent Labs  Lab 05/25/19 1049 05/31/2019 1531 05/21/2019 1554 05/29/19 0248 05/30/19 0939  K 4.6 4.0 3.7 3.5 2.4*   Hypomagnesemia- will replet  Lactic acidosis due to severe dehydration improving on IV fluids.Bicarb at 19.Monitor.He clearly is started on IV antibiotics will de-escalate/stop once blood culture negative.  Hypotonic hyponatremia sodium improved with fluids.  Pancytopenia/anemia: in the setting of multiple myeloma, monitor closely avoid heparin product due to low platelet count.  Platelet low at  24K.  Patient not taking any p.o. meds, avoid aspirin,  heparin products. Outpatient oncology input on board, transfuse platelets less than 10,000 or if  any acute bleeding. Hemoglobin downtrending 7.7 g normally around 9 g at baseline on admission hemoconcentrated and high at 10-11. Transfuse prbc as needed < 7 gm.  Await for further oncology recommendation Recent Labs  Lab 05/25/19 1049 05/12/2019 1531 05/29/19 0248 05/30/19 0939  PLT 75* 44* 34* 24*   Multiple yeloma in relapse currently on chemotherapy-dexamethasone 16 mg every Tuesday and Thursday and selinexor at 60 mg every Tuesday and Thursday followed by Dr. Alvy Bimler. I have consulted Oncology team appreciate input and his most recent lab work shows improving myeloma.  Hypothyroidism: Euthyroid TSH 0.6.Continue home Synthroid.  DVT prophylaxis:SCD Code Status: DNR after discussion with the patient and his wife.  Confirmed with patient's wife.  I did updated patient's son about his wishes and they are going to discuss again.  Family Communication: I have updated patient's family and admissions and subsequent days.    Status is: Inpatient Remains inpatient appropriate because:Persistent severe electrolyte disturbances, IV treatments appropriate due to intensity of illness or inability to take PO and Inpatient level of care appropriate due to severity of illness  Dispo: The patient is from: Home              Anticipated d/c is to: TBD              Anticipated d/c date is:  >3 days              Patient currently is not medically stable to d/c. Nutrition: Diet Order            Diet NPO time specified  Diet effective now             Body mass index is 17.28 kg/m.  Consultants:see note  Procedures:see note Microbiology:see note  Medications: Scheduled Meds: . allopurinol  100 mg Oral BID  . brimonidine  1 drop Right Eye TID  . Chlorhexidine Gluconate Cloth  6 each Topical Daily  . dorzolamide-timolol  1 drop Right Eye BID  . fluorometholone  1 drop Right Eye Daily  . latanoprost  1 drop Right Eye QHS  . levothyroxine  37.5 mcg Intravenous Daily  . mouth rinse  15 mL  Mouth Rinse BID  . sodium chloride flush  10-40 mL Intracatheter Q12H   Continuous Infusions: . ceFEPime (MAXIPIME) IV Stopped (05/29/19 1652)  . dextrose 5 % and 0.9% NaCl 125 mL/hr at 05/30/19 0300  . [START ON 05/31/2019] famotidine (PEPCID) IV    . potassium chloride      Antimicrobials: Anti-infectives (From admission, onward)   Start     Dose/Rate Route Frequency Ordered Stop   05/29/19 1600  ceFEPIme (MAXIPIME) 2 g in sodium chloride 0.9 % 100 mL IVPB     2 g 200 mL/hr over 30 Minutes Intravenous Every 24 hours 05/15/2019 1916     05/27/2019 1630  ceFEPIme (MAXIPIME) 2 g in sodium chloride 0.9 % 100 mL IVPB     2 g 200 mL/hr over 30 Minutes Intravenous  Once 05/10/2019 1619 05/30/2019 1803       Objective: Vitals: Today's Vitals   05/30/19 0300 05/30/19 0400 05/30/19 0500 05/30/19 0800  BP: (!) 147/80 (!) 143/84 125/81   Pulse: 71 75 68   Resp: '10 15 12   ' Temp:  97.6 F (36.4 C)  97.7 F (36.5 C)  TempSrc:  Oral  Oral  SpO2: 99% 99% 100%   Weight:  Height:      PainSc:        Intake/Output Summary (Last 24 hours) at 05/30/2019 1114 Last data filed at 05/30/2019 0500 Gross per 24 hour  Intake 2050.59 ml  Output 1625 ml  Net 425.59 ml   Filed Weights   05/15/2019 1831  Weight: 56.2 kg   Weight change:    Intake/Output from previous day: 05/22 0701 - 05/23 0700 In: 2596.4 [I.V.:2496.4; IV Piggyback:100] Out: 2325 [Urine:2325] Intake/Output this shift: No intake/output data recorded.  Examination: General exam: Sleepy, able to wake up and tell me his name, thinks he is here at home, on room air.   HEENT:Oral mucosa moist, Ear/Nose WNL grossly, dentition normal. Respiratory system: bilaterally clear,no wheezing or crackles,no use of accessory muscle Cardiovascular system: S1 & S2 +, No JVD,. Gastrointestinal system: Abdomen soft, NT,ND, BS+ Nervous System: Lethargic, weak frail, able to moved extremities to pain Extremities: No edema, distal peripheral  pulses palpable.  Skin: No rashes,no icterus. MSK: Normal muscle bulk,tone, power  Data Reviewed: I have personally reviewed following labs and imaging studies CBC: Recent Labs  Lab 05/25/19 1049 05/31/2019 1531 05/31/2019 1554 05/29/19 0248 05/30/19 0939  WBC 11.9* 8.2  --  8.8 8.6  NEUTROABS 10.2* 7.7  --   --   --   HGB 10.3* 11.2* 10.9* 8.7* 7.7*  HCT 30.3* 31.7* 32.0* 26.1* 23.0*  MCV 87.3 87.3  --  89.4 90.6  PLT 75* 44*  --  34* 24*   Basic Metabolic Panel: Recent Labs  Lab 05/25/19 1049 05/18/2019 1531 06/06/2019 1554 05/29/19 0248 05/30/19 0939  NA 128* 133* 131* 137 140  K 4.6 4.0 3.7 3.5 2.4*  CL 96* 92* 95* 109 113*  CO2 23 25  --  19* 22  GLUCOSE 122* 227* 213* 181* 149*  BUN 33* 65* 51* 48* 24*  CREATININE 1.79* 2.65* 2.70* 1.78* 1.41*  CALCIUM 9.0 8.8*  --  7.5* 7.3*  MG 1.7  --   --   --   --    GFR: Estimated Creatinine Clearance: 35.4 mL/min (A) (by C-G formula based on SCr of 1.41 mg/dL (H)). Liver Function Tests: Recent Labs  Lab 05/25/19 1049 05/25/2019 1531 05/30/19 0939  AST 12* 15 19  ALT '29 24 25  ' ALKPHOS 66 57 49  BILITOT 1.0 1.2 0.7  PROT 6.7 6.7 5.2*  ALBUMIN 3.6 3.6 2.5*   Recent Labs  Lab 05/24/2019 1531  LIPASE 35   Recent Labs  Lab 05/24/2019 1531  AMMONIA 14   Coagulation Profile: Recent Labs  Lab 06/02/2019 1531  INR 1.2   Cardiac Enzymes: No results for input(s): CKTOTAL, CKMB, CKMBINDEX, TROPONINI in the last 168 hours. BNP (last 3 results) No results for input(s): PROBNP in the last 8760 hours. HbA1C: No results for input(s): HGBA1C in the last 72 hours. CBG: Recent Labs  Lab 06/02/2019 1525 05/30/19 0011  GLUCAP 225* 128*   Lipid Profile: No results for input(s): CHOL, HDL, LDLCALC, TRIG, CHOLHDL, LDLDIRECT in the last 72 hours. Thyroid Function Tests: Recent Labs    05/26/2019 1850  TSH 0.606   Anemia Panel: No results for input(s): VITAMINB12, FOLATE, FERRITIN, TIBC, IRON, RETICCTPCT in the last 72  hours. Sepsis Labs: Recent Labs  Lab 06/02/2019 1531 05/29/2019 1727 05/19/2019 2300  LATICACIDVEN 4.1* 3.1* 2.3*    Recent Results (from the past 240 hour(s))  Urine culture     Status: None   Collection Time: 05/19/2019  3:27 PM   Specimen:  Urine, Clean Catch  Result Value Ref Range Status   Specimen Description   Final    URINE, CLEAN CATCH Performed at [facility], [place] [address], [place], [place] [address]    Special Requests   Final    NONE Performed at [facility], [place] [address], [place], [place] [address]    Culture   Final    NO GROWTH Performed at [facility], [place] [address] [place], [place] [address]    Report Status 05/30/2019 FINAL  Final  SARS Coronavirus 2 by RT PCR (hospital order, performed in [facility] hospital lab) Nasopharyngeal Nasopharyngeal Swab     Status: None   Collection Time: 06/04/2019  4:12 PM   Specimen: Nasopharyngeal Swab  Result Value Ref Range Status   SARS Coronavirus 2 NEGATIVE NEGATIVE Final    Comment: (NOTE) SARS-CoV-2 target nucleic acids are NOT DETECTED. The SARS-CoV-2 RNA is generally detectable in upper and lower respiratory specimens during the acute phase of infection. The lowest concentration of SARS-CoV-2 viral copies this assay can detect is 250 copies / mL. A negative result does not preclude SARS-CoV-2 infection and should not be used as the sole basis for treatment or other patient management decisions.  A negative result may occur with improper specimen collection / handling, submission of specimen other than nasopharyngeal swab, presence of viral mutation(s) within the areas targeted by this assay, and inadequate number of viral copies (<250 copies / mL). A negative result must be combined with clinical observations, patient history, and epidemiological information. Fact Sheet for Patients:   StrictlyIdeas.no Fact Sheet for Healthcare  Providers: BankingDealers.co.za This test is not yet approved or cleared  by the [place] FDA and has been authorized for detection and/or diagnosis of SARS-CoV-2 by FDA under an Emergency Use Authorization (EUA).  This EUA will remain in effect (meaning this test can be used) for the duration of the COVID-19 declaration under Section 564(b)(1) of the Act, 21 U.S.C. section 360bbb-3(b)(1), unless the authorization is terminated or revoked sooner. Performed at [facility], [place] [address] [place], [place] [address]   Culture, blood (routine x 2)     Status: None (Preliminary result)   Collection Time: 05/26/2019  4:18 PM   Specimen: BLOOD  Result Value Ref Range Status   Specimen Description   Final    BLOOD SITE NOT SPECIFIED Performed at [place] [address], [place], [place] [address]    Special Requests   Final    BOTTLES DRAWN AEROBIC AND ANAEROBIC Blood Culture adequate volume Performed at [place] [address], [place], [place] [address]    Culture   Final    NO GROWTH < 12 HOURS Performed at [facility] [address], [place], [place] [address]    Report Status PENDING  Incomplete  MRSA PCR Screening     Status: None   Collection Time: 05/12/2019  6:25 PM   Specimen: Nasal Mucosa; Nasopharyngeal  Result Value Ref Range Status   MRSA by PCR NEGATIVE NEGATIVE Final    Comment:        The GeneXpert MRSA Assay (FDA approved for NASAL specimens only), is one component of a comprehensive MRSA colonization surveillance program. It is not intended to diagnose MRSA infection nor to guide or monitor treatment for MRSA infections. Performed at [facility], [place] [address], [place], [place] [address]  Radiology Studies: CT Head Wo Contrast  Result Date: 05/24/2019 CLINICAL DATA:  Unresponsive, failure to thrive, multiple myeloma EXAM: CT HEAD  WITHOUT CONTRAST TECHNIQUE: Contiguous axial images were obtained from the base of the skull through the vertex without intravenous contrast. COMPARISON:  None. FINDINGS: Brain: There is scattered hypodensities throughout the periventricular white matter consistent with age-indeterminate small vessel ischemic changes. No other signs of acute infarct or hemorrhage. Lateral ventricles and midline structures are unremarkable. No acute extra-axial fluid collections. No mass effect. Vascular: No hyperdense vessel or unexpected calcification. Skull: Normal. Negative for fracture or focal lesion. Sinuses/Orbits: No acute finding. Other: None. IMPRESSION: 1. Age-indeterminate small vessel ischemic changes throughout the periventricular white matter. No acute hemorrhage. Electronically Signed   By: Randa Ngo M.D.   On: 05/27/2019 17:04   DG Chest Port 1 View  Result Date: 05/23/2019 CLINICAL DATA:  Altered mental status. EXAM: PORTABLE CHEST 1 VIEW COMPARISON:  April 18, 2019 FINDINGS: There is stable right-sided venous Port-A-Cath positioning. Mildly decreased lung volumes are seen which is likely secondary to suboptimal patient inspiration. There is no evidence of acute infiltrate, pleural effusion or pneumothorax. The heart size and mediastinal contours are within normal limits. The visualized skeletal structures are unremarkable. IMPRESSION: No active disease. Electronically Signed   By: Virgina Norfolk M.D.   On: 05/25/2019 16:41     LOS: 2 days   Antonieta Pert, MD [facility]  05/30/2019, 11:14 AM

## 2019-05-30 NOTE — Consult Note (Signed)
Palliative Medicine  Name: Calvin Mccormick Date: 05/30/2019 MRN: [id number]  DOB: 02/09/1945  Patient Care Team: Lucianne Lei, MD as PCP - General (Family Medicine) Heath Lark, MD as Consulting Physician (Hematology and Oncology) Beverely Pace, LCSW as Social Worker (General Practice)    [place]: Calvin Mccormick is a 74 y.o. male with multiple medical problems including multiple myeloma in relapse on treatment with selinexor and dexamethasone who was admitted to the hospital on 05/19/2019 with altered mental status.  Reportedly, patient had N/V with progressive confusion and lethargy over the 2 days prior to admission.  Work-up to date is unrevealing of cause for altered mental status.  Patient was felt to have dehydration.  He is pending MRI of the brain and EEG.  Palliative care was consulted up address goals.  SOCIAL HISTORY:     reports that he has never smoked. He has never used smokeless tobacco. He reports that he does not drink alcohol or use drugs.   Patient is married and lives at home with his wife and son.  He also has a daughter who lives nearby.  Patient previously worked in a [employer], trade [employer].  ADVANCE DIRECTIVES:  Not on file  CODE STATUS: DNR  PAST MEDICAL HISTORY: Past Medical History:  Diagnosis Date  . Anemia 11/27/2010  . Cancer ([place])   . Chronic renal insufficiency, stage III (moderate) 06/17/2011   Due to myeloma & HTN  . CKD (chronic kidney disease), stage III 02/05/2011  . 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 Diarrhea 06/17/2011   Hospital admission 05/30/11 velcade toxicity? Infectious?  Marland Kitchen Hyperlipemia   . Hypertension, benign essential, goal below 140/90 11/27/2010  . Hypokalemia with normal acid-base balance 07/29/2011  . Hypomagnesemia 07/29/2011  . Seasonal allergies     PAST SURGICAL HISTORY:  Past Surgical History:  Procedure Laterality Date  . COLONOSCOPY  07/26/2011   Procedure: COLONOSCOPY;  Surgeon: Cleotis Nipper, MD;   Location: [facility];  Service: Endoscopy;  Laterality: N/A;  . ESOPHAGOGASTRODUODENOSCOPY (EGD) WITH PROPOFOL Left 10/17/2016   Procedure: ESOPHAGOGASTRODUODENOSCOPY (EGD) WITH PROPOFOL;  Surgeon: Ronnette Juniper, MD;  Location: [facility];  Service: Gastroenterology;  Laterality: Left;  . EYE SURGERY     bilateral cataract  surgery  . MULTIPLE EXTRACTIONS WITH ALVEOLOPLASTY N/A 05/27/2014   Procedure: Extraction of tooth #'s 1,6,7,8,10,11,13,21,22,23,24,25,26,27 and 28 with alveoloplasty;  Surgeon: Lenn Cal, DDS;  Location: [facility];  Service: Oral Surgery;  Laterality: N/A;  . porta cath      for chemotherapy- right chest area    HEMATOLOGY/ONCOLOGY HISTORY:  Oncology History Overview Note  Progressed on Elotuzumab, revlimid, Velcade, Daratumumab and Pomalyst   Multiple myeloma in relapse ([place])  12/12/2010 Initial Diagnosis   Multiple myeloma   02/16/2014 Imaging   Skeletal survey showed diffuse osteopenia   03/02/2014 Bone Marrow Biopsy   Accession: [id number] biopsy showed only 5 % plasma cells. However, the biopsy was difficult and the bone was fragmented during the procedure. Cytogenetics and FISH study is normal   03/03/2014 Procedure   he has port placement   03/10/2014 - 05/19/2014 Chemotherapy   he received elotuzumab and revlimid   05/20/2014 - 02/25/2016 Chemotherapy   Treatment is switched to maintenance Revlimid only. Treatment is stopped due to progressive disease   11/15/2014 Adverse Reaction   He has mild worsening anemia. Does of Revlimid reduced to 5 mg 21 days on, 7 days off   03/06/2016 -  Chemotherapy   He  received Daratumumab and Velcade. Velcade is stopped on 07/24/16   03/06/2016 - 02/15/2019 Chemotherapy   The patient had daratumumab with Pomalyst for chemotherapy treatment.     08/14/2016 Miscellaneous   Pomalyst is added along with Daratumumab   10/17/2016 Surgery   EGD was performed for coffee-ground emesis. Multiple linear superficial ulcers were  noted in the esophagus from 25-35 cm from insertion. Severe esophagitis was noted from 30-35 cm from insertion. Multiple biopsies were obtained. A small hiatal hernia was noted. The gastric cavity contained coffee-ground fluid, after suctioning and lavage, no obvious gastric lesions/erosion/ulceration/erythema was noted. Biopsies were taken from the antrum to rule out H. Pylori. Duodenum bulb appeared unremarkable. A small diverticulum was noted in second portion of the duodenum.     12/06/2016 Imaging   Ct scan abdomen No acute findings.  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  Stable small hiatal hernia.  Colonic diverticulosis, without radiographic evidence of diverticulitis.     ALLERGIES:  has No Known Allergies.  MEDICATIONS:  Current Facility-Administered Medications  Medication Dose Route Frequency Provider Last Rate Last Admin  . acetaminophen (TYLENOL) tablet 650 mg  650 mg Oral Q6H PRN Kc, Maren Beach, MD       Or  . acetaminophen (TYLENOL) suppository 650 mg  650 mg Rectal Q6H PRN Kc, Ramesh, MD      . albuterol (PROVENTIL) (2.5 MG/3ML) 0.083% nebulizer solution 2.5 mg  2.5 mg Nebulization Q2H PRN Kc, Ramesh, MD      . allopurinol (ZYLOPRIM) tablet 100 mg  100 mg Oral BID Kc, Ramesh, MD      . brimonidine (ALPHAGAN) 0.2 % ophthalmic solution 1 drop  1 drop Right Eye TID Antonieta Pert, MD   1 drop at 05/30/19 1048  . ceFEPIme (MAXIPIME) 2 g in sodium chloride 0.9 % 100 mL IVPB  2 g Intravenous Q24H Luiz Ochoa, [facility]   Stopped at 05/29/19 1652  . Chlorhexidine Gluconate Cloth 2 % PADS 6 each  6 each Topical Daily Antonieta Pert, MD   6 each at 05/30/19 1200  . dextrose 5 %-0.9 % sodium chloride infusion   Intravenous Continuous Kc, Ramesh, MD 75 mL/hr at 05/30/19 1201 Rate Change at 05/30/19 1201  . dorzolamide-timolol (COSOPT) 22.3-6.8 MG/ML ophthalmic solution 1 drop  1 drop Right Eye BID Antonieta Pert, MD   1 drop at 05/30/19 1052  .  [START ON 05/31/2019] famotidine (PEPCID) IVPB 20 mg premix  20 mg Intravenous Q24H Kc, Ramesh, MD      . fluorometholone (FML) 0.1 % ophthalmic suspension 1 drop  1 drop Right Eye Daily Kc, Ramesh, MD   1 drop at 05/30/19 1254  . latanoprost (XALATAN) 0.005 % ophthalmic solution 1 drop  1 drop Right Eye Cloretta Ned, MD   1 drop at 05/29/19 2134  . levothyroxine (SYNTHROID, LEVOTHROID) injection 37.5 mcg  37.5 mcg Intravenous Daily Kc, Ramesh, MD   37.5 mcg at 05/30/19 1042  . MEDLINE mouth rinse  15 mL Mouth Rinse BID Kc, Ramesh, MD   15 mL at 05/29/19 2134  . ondansetron (ZOFRAN) tablet 4 mg  4 mg Oral Q6H PRN Kc, Ramesh, MD       Or  . ondansetron (ZOFRAN) injection 4 mg  4 mg Intravenous Q6H PRN Kc, Ramesh, MD      . potassium chloride 10 mEq in 100 mL IVPB  10 mEq Intravenous Q1 Hr x 6 Kc, Ramesh, MD 100 mL/hr at 05/30/19 1441 10 mEq  at 05/30/19 1441  . sodium chloride flush (NS) 0.9 % injection 10-40 mL  10-40 mL Intracatheter Q12H Kc, Ramesh, MD   10 mL at 05/30/19 1047  . sodium chloride flush (NS) 0.9 % injection 10-40 mL  10-40 mL Intracatheter PRN Kc, Ramesh, MD        VITAL SIGNS: BP 130/66   Pulse 62   Temp 98.1 F (36.7 C) (Oral)   Resp 13   Ht _0  (1.803 m)   Wt 123 lb 14.4 oz (56.2 kg)   SpO2 100%   BMI 17.28 kg/m  Filed Weights   06/04/2019 1831  Weight: 123 lb 14.4 oz (56.2 kg)    Estimated body mass index is 17.28 kg/m as calculated from the following:   Height as of this encounter: _1  (1.803 m).   Weight as of this encounter: 123 lb 14.4 oz (56.2 kg).  LABS: CBC:    Component Value Date/Time   WBC 8.6 05/30/2019 0939   HGB 7.7 (L) 05/30/2019 0939   HGB 9.5 (L) 05/11/2019 1024   HGB 8.4 (L) 01/09/2017 0913   HCT 23.0 (L) 05/30/2019 0939   HCT 25.0 (L) 01/09/2017 0913   PLT 24 (LL) 05/30/2019 0939   PLT 118 (L) 05/11/2019 1024   PLT 406 (H) 01/09/2017 0913   MCV 90.6 05/30/2019 0939   MCV 94.1 01/09/2017 0913   NEUTROABS 7.7 05/27/2019 1531    NEUTROABS 1.3 (L) 01/09/2017 0913   LYMPHSABS 0.3 (L) 05/24/2019 1531   LYMPHSABS 2.2 01/09/2017 0913   MONOABS 0.2 05/18/2019 1531   MONOABS 0.5 01/09/2017 0913   EOSABS 0.0 06/06/2019 1531   EOSABS 0.3 01/09/2017 0913   BASOSABS 0.0 05/19/2019 1531   BASOSABS 0.1 01/09/2017 0913   Comprehensive Metabolic Panel:    Component Value Date/Time   NA 140 05/30/2019 0939   NA 140 01/09/2017 0913   K 2.4 (LL) 05/30/2019 0939   K 3.7 01/09/2017 0913   CL 113 (H) 05/30/2019 0939   CL 107 06/29/2012 1131   CO2 22 05/30/2019 0939   CO2 21 (L) 01/09/2017 0913   BUN 24 (H) 05/30/2019 0939   BUN 9.8 01/09/2017 0913   CREATININE 1.41 (H) 05/30/2019 0939   CREATININE 2.26 (H) 05/11/2019 1025   CREATININE 1.5 (H) 01/09/2017 0913   GLUCOSE 149 (H) 05/30/2019 0939   GLUCOSE 80 01/09/2017 0913   GLUCOSE 143 (H) 06/29/2012 1131   CALCIUM 7.3 (L) 05/30/2019 0939   CALCIUM 7.3 (L) 04/22/2019 0435   CALCIUM 7.5 (L) 01/09/2017 0913   AST 19 05/30/2019 0939   AST 9 (L) 05/11/2019 1025   AST 12 01/09/2017 0913   ALT 25 05/30/2019 0939   ALT 9 05/11/2019 1025   ALT 10 01/09/2017 0913   ALKPHOS 49 05/30/2019 0939   ALKPHOS 55 01/09/2017 0913   BILITOT 0.7 05/30/2019 0939   BILITOT 0.7 05/11/2019 1025   BILITOT 0.44 01/09/2017 0913   PROT 5.2 (L) 05/30/2019 0939   PROT 5.7 (L) 01/09/2017 0913   ALBUMIN 2.5 (L) 05/30/2019 0939   ALBUMIN 2.9 (L) 01/09/2017 0913    RADIOGRAPHIC STUDIES: CT Head Wo Contrast  Result Date: 05/12/2019 CLINICAL DATA:  Unresponsive, failure to thrive, multiple myeloma EXAM: CT HEAD WITHOUT CONTRAST TECHNIQUE: Contiguous axial images were obtained from the base of the skull through the vertex without intravenous contrast. COMPARISON:  None. FINDINGS: Brain: There is scattered hypodensities throughout the periventricular white matter consistent with age-indeterminate small vessel ischemic changes. No other  signs of acute infarct or hemorrhage. Lateral ventricles and  midline structures are unremarkable. No acute extra-axial fluid collections. No mass effect. Vascular: No hyperdense vessel or unexpected calcification. Skull: Normal. Negative for fracture or focal lesion. Sinuses/Orbits: No acute finding. Other: None. IMPRESSION: 1. Age-indeterminate small vessel ischemic changes throughout the periventricular white matter. No acute hemorrhage. Electronically Signed   By: Randa Ngo M.D.   On: 05/29/2019 17:04   DG Chest Port 1 View  Result Date: 05/13/2019 CLINICAL DATA:  Altered mental status. EXAM: PORTABLE CHEST 1 VIEW COMPARISON:  April 18, 2019 FINDINGS: There is stable right-sided venous Port-A-Cath positioning. Mildly decreased lung volumes are seen which is likely secondary to suboptimal patient inspiration. There is no evidence of acute infiltrate, pleural effusion or pneumothorax. The heart size and mediastinal contours are within normal limits. The visualized skeletal structures are unremarkable. IMPRESSION: No active disease. Electronically Signed   By: Virgina Norfolk M.D.   On: 06/01/2019 16:41    PERFORMANCE STATUS (ECOG) : 4 - Bedbound  Review of Systems Unable to complete  Physical Exam General: Frail appearing Pulmonary: Unlabored Abdomen: soft, nontender, + bowel sounds GU: no suprapubic tenderness Extremities: no edema, no joint deformities Skin: no rashes Neurological: Lethargic, patient wakes when stimulated and can say a few words.  He follows simple commands.  IMPRESSION: Patient remains lethargic.  He wakes when stimulated and can follow simple commands.  He told me his name but was otherwise difficult to understand his speech pattern.  I was unable to engage him meaningfully in a conversation regarding his goals.  I met with patient's wife and son.  I also spoke with patient's daughter by phone.  Together, we reviewed patient's current medical problems and work-up to date.  Both remain committed to the current scope of  treatment.  They say that at baseline, patient was completely oriented and able to interact with family.  He was ambulatory with use of a cane and occasional assistance from his son.  Patient was able to provide most of his own self-care including bathing and dressing.  Son attributes patient's altered mental status to use of a "stomach medication".  I suspect he is referring to the prochlorperazine that patient was taking as needed for nausea.  It seems unlikely that this would be the etiology of his metabolic encephalopathy.  Again, patient is pending MRI and EEG.  We will plan to reengage family in a conversation regarding goals after completion of work-up.  PLAN: -Continue current scope of treatment -DNR -We will follow   Time Total: 60 minutes  Visit consisted of counseling and education dealing with the complex and emotionally intense issues of symptom management and palliative care in the setting of serious and potentially life-threatening illness.Greater than 50%  of this time was spent counseling and coordinating care related to the above assessment and plan.  Signed by: Altha Harm, PhD, NP-C

## 2019-05-31 ENCOUNTER — Inpatient Hospital Stay (HOSPITAL_COMMUNITY)
Admit: 2019-05-31 | Discharge: 2019-05-31 | Disposition: A | Discharge status: Home / Self Care | Payer: Medicare Other | Attending: Internal Medicine | Admitting: Internal Medicine

## 2019-05-31 ENCOUNTER — Inpatient Hospital Stay (HOSPITAL_COMMUNITY): Payer: Medicare Other

## 2019-05-31 LAB — COMPREHENSIVE METABOLIC PANEL
ALT: 28 U/L (ref 0–44)
AST: 18 U/L (ref 15–41)
Albumin: 2.4 g/dL — ABNORMAL LOW (ref 3.5–5.0)
Alkaline Phosphatase: 55 U/L (ref 38–126)
Anion gap: 7 (ref 5–15)
BUN: 17 mg/dL (ref 8–23)
CO2: 21 mmol/L — ABNORMAL LOW (ref 22–32)
Calcium: 7.5 mg/dL — ABNORMAL LOW (ref 8.9–10.3)
Chloride: 112 mmol/L — ABNORMAL HIGH (ref 98–111)
Creatinine, Ser: 1.17 mg/dL (ref 0.61–1.24)
GFR calc Af Amer: >60 mL/min (ref >60)
GFR calc non Af Amer: >60 mL/min (ref >60)
Glucose, Bld: 133 mg/dL — ABNORMAL HIGH (ref 70–99)
Potassium: 2.5 mmol/L — CL (ref 3.5–5.1)
Sodium: 140 mmol/L (ref 135–145)
Total Bilirubin: 0.7 mg/dL (ref 0.3–1.2)
Total Protein: 5.1 g/dL — ABNORMAL LOW (ref 6.5–8.1)

## 2019-05-31 LAB — CBC
HCT: 22.6 % — ABNORMAL LOW (ref 39.0–52.0)
Hemoglobin: 7.5 g/dL — ABNORMAL LOW (ref 13.0–17.0)
MCH: 29.8 pg (ref 26.0–34.0)
MCHC: 33.2 g/dL (ref 30.0–36.0)
MCV: 89.7 fL (ref 80.0–100.0)
Platelets: 19 10*3/uL — CL (ref 150–400)
RBC: 2.52 MIL/uL — ABNORMAL LOW (ref 4.22–5.81)
RDW: 23.5 % — ABNORMAL HIGH (ref 11.5–15.5)
WBC: 6.7 10*3/uL (ref 4.0–10.5)
nRBC: 0 % (ref 0.0–0.2)

## 2019-05-31 LAB — VITAMIN B12: Vitamin B-12: 300 pg/mL (ref 180–914)

## 2019-05-31 LAB — POTASSIUM: Potassium: 3.4 mmol/L — ABNORMAL LOW (ref 3.5–5.1)

## 2019-05-31 MED ORDER — SODIUM CHLORIDE 0.45 % IV SOLN: INTRAVENOUS | Status: DC

## 2019-05-31 MED ORDER — POTASSIUM CHLORIDE 10 MEQ/100ML IV SOLN
10.0000 meq | INTRAVENOUS | Status: AC
  Administered 2019-05-31 (×4): 10 meq via INTRAVENOUS
  Filled 2019-05-31 (×4): qty 100

## 2019-05-31 MED ORDER — SODIUM CHLORIDE 0.45 % IV SOLN
INTRAVENOUS | Status: DC
  Filled 2019-05-31 (×7): qty 1000

## 2019-05-31 MED ORDER — MAGNESIUM SULFATE IN D5W 1-5 GM/100ML-% IV SOLN
1.0000 g | Freq: Once | INTRAVENOUS | Status: AC
  Administered 2019-05-31: 1 g via INTRAVENOUS
  Filled 2019-05-31: qty 100

## 2019-05-31 NOTE — Progress Notes (Signed)
PROGRESS NOTE    Calvin Mccormick  LPF:[id number] DOB: 06-Jun-1945 DOA: 06/01/2019 PCP: Lucianne Lei, MD   Chef Complaints: unresponsiveness.  Brief Narrative:74 y.o. male with medical history significant for multiple myeloma in relapse currently on chemotherapy-dexamethasone 16 mg every Tuesday and Thursday and selinexor at 60 mg every Tuesday and Thursday followed by Dr. 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  Patient unable to provide history and presentation.  As per family patient developed significant nausea and vomiting after taking "stomach pills" yesterday afternoon and became less responsive and since then has been intermittently unresponsive sometimes walking up, not been eating well for the past 2 days.  No reported fever, chest pain, nausea, vomiting, fever, chills,, focal weakness, dysuria.  In ED:On presentation patient was minimally responsive, vitals with a stable blood pressure saturating well on room air, temperature 97.4, blood work showed hyponatremia, worsening renal failure lactic acid is 4.1, hemoglobin 10.9 g, platelet 44,000.  Patient was given 1800 mL of fluid about to finish his bolus, since starting fluid patient has perked up is much more alert awake oriented knows he is in the hospital. When I saw his wife is at the bedside helping with history.  Last x-ray and CT head no acute findings. Repeat lactic acid has been sent, UA VBG, Covid screening pending. Patient is admitted with empiric antibiotics and aggressive IV fluid hydration.   BUN/creatinine is improving but patient remains significantly encephalopathic.  Subjective:  Seen this morning Having loose stool, rectal tube in place Patient is lethargic, able to briefly open eyes says for response, not following commands, moving his  extremities to pain Urine output 1800, BUN/creatinine has improved potassium is still low  Assessment & Plan:  Acute metabolic encephalopathy likely due to severe dehydration/uremic symptoms on admission. In ED was nonfocal, and CT brain with no acute finding and no obvious infection UA and chest x-ray. Blood culture so far negative patient has been on empiric antibiotics with cefepime-since cultures data negative so far and leukocytosis improved we will discontinue cefepime, patient is having diarrhea and C. difficile was negative. Cont NPO till safe to take p.o.I had discussed with Dr. Rory Percy from neurology and ordered MRI brain-that did not show any acute finding, EEG showed moderate to severe diffuse encephalopathy, ? if it is contributed by [name].  B12- 300, b1 pending, tsh 0.6, ammonia- Nl. Cont supportive measures and  ivfluid hydration.Palliative care on board. Oncology on board.  Nausea vomiting at home currently none here. could be secondary to his chemo versus uremic symptom: Continue on symptomatic management.  Remains n.p.o.Continue pepcid , hold of Protonix due to thrombocytopenia  Diarrhea continue rectal tube, C. difficile negative.  Stopping antibiotics as #1  Severe dehydration due to #1 continue on aggressive oral hydration. uop STABLE  AKI on CKD stage IIIa, baseline CKD from multiple myeloma with creatinine1.2-1.6, bun IN 10s in 04/2019, slowly worsening recently  1.7-2.2. Significantly elevated BUN/creatinine on admission.  With aggressive fluid BUN/creatinine has improved, CONT IVF gently while npo and having diarrhea  Recent Labs  Lab 05/12/2019 1531 05/27/2019 1554 05/29/19 0248 05/30/19 0939 05/31/19 0601  BUN 65* 51* 48* 24* 17  CREATININE 2.65* 2.70* 1.78* 1.41* 1.17   Hypokalemia, severe: We are now on aggressive IV potassium replacement with KCl 40 M EQ IV ordered this morning, change IV fluids to half-normal saline with  potassium chloride .  Repeat potassium this  afternoon, replete mag.   Recent Labs  Lab 06/01/2019 1554 05/29/19 0248 05/30/19 0939 05/30/19 2114 05/31/19 0601  K 3.7 3.5 2.4* 2.9* 2.5*   Hypomagnesemia-being repleted.  Lactic acidosis due to severe dehydration improving on IV fluids.Bicarb at 19.Monitor.He clearly is started on IV antibiotics will de-escalate/stop once blood culture negative.  Hypotonic hyponatremia sodium improved with fluids.  Pancytopenia with anemia, thrombocytopenia:in the setting of multiple myeloma,monitor closely avoid heparin product due to low platelet count.Avoid aspirin, heparin product.Oncology on board and appreciate input.And advised Platelets transfusion if less than 10,000 or if any active bleeding noted.  Currently no signs of bleeding hemoglobin low but stable continue to monitor. Hb normally around 9 g at baseline on admission hemoconcentrated and high at 10-11. Transfuse prbc as needed < 7 gm.  Recent Labs  Lab 05/25/19 1049 06/01/2019 1531 05/29/19 0248 05/30/19 0939 05/31/19 0601  PLT 75* 44* 34* 24* 19*   Recent Labs  Lab 05/31/2019 1531 05/13/2019 1554 05/29/19 0248 05/30/19 0939 05/31/19 0601  HGB 11.2* 10.9* 8.7* 7.7* 7.5*  HCT 31.7* 32.0* 26.1* 23.0* 22.6*   Multiple yeloma in relapse currently on chemotherapy-dexamethasone 16 mg every Tuesday and Thursday and selinexor at 60 mg every Tuesday and Thursday followed by Dr. Alvy Bimler. I have consulted Oncology team appreciate input and his most recent lab work shows improving myeloma.  Hypothyroidism:Euthyroid TSH 0.6.Continue iv synthroid  Goals of care: discussed his CODE STATUS on admission with patient and his wife and per discussion DNR.  This has been communicated with patient's family.  Palliative care has been involved.  DVT prophylaxis:SCD. Code Status:DNR. Family Communication:I have updated patient's family and admissions and subsequent days (wife and son, daughter phone goes to voice mail).  I called and updated patient's  wife again today she will be visiting him later today along with his son.  She understand that patient remains minimally responsive prognosis guarded and will continue on supportive care.  Answered all the questions to the best of my abilities.  Status is: Inpatient Remains inpatient appropriate because:Persistent severe electrolyte disturbances, IV treatments appropriate due to intensity of illness or inability to take PO and Inpatient level of care appropriate due to severity of illness  Dispo: The patient is from: Home              Anticipated d/c is to: TBD              Anticipated d/c date is:  >3 days              Patient currently is not medically stable to d/c. Nutrition: Diet Order            Diet NPO time specified  Diet effective now             Body mass index is 17.28 kg/m.  Consultants:see note  Procedures:  HWE:XHBZ study is suggestive of moderate to severe diffuse encephalopathy, nonspecific etiology. No seizures or epileptiform discharges were seen throughout the recording  Microbiology:see note  Medications: Scheduled Meds: . allopurinol  100 mg Oral BID  . brimonidine  1 drop Right Eye TID  . Chlorhexidine Gluconate Cloth  6 each Topical Daily  . dorzolamide-timolol  1 drop Right Eye BID  . fluorometholone  1 drop Right Eye Daily  . latanoprost  1 drop Right Eye QHS  . levothyroxine  37.5 mcg Intravenous Daily  . mouth rinse  15 mL Mouth Rinse BID  .  sodium chloride flush  10-40 mL Intracatheter Q12H   Continuous Infusions: . ceFEPime (MAXIPIME) IV Stopped (05/30/19 1654)  . dextrose 5 % and 0.9% NaCl 75 mL/hr at 05/31/19 0500  . famotidine (PEPCID) IV    . potassium chloride      Antimicrobials: Anti-infectives (From admission, onward)   Start     Dose/Rate Route Frequency Ordered Stop   05/29/19 1600  ceFEPIme (MAXIPIME) 2 g in sodium chloride 0.9 % 100 mL IVPB     2 g 200 mL/hr over 30 Minutes Intravenous Every 24 hours 06/06/2019 1916      05/20/2019 1630  ceFEPIme (MAXIPIME) 2 g in sodium chloride 0.9 % 100 mL IVPB     2 g 200 mL/hr over 30 Minutes Intravenous  Once 05/10/2019 1619 05/17/2019 1803       Objective: Vitals: Today's Vitals   05/30/19 2230 05/31/19 0000 05/31/19 0400 05/31/19 0600  BP: 132/78   (!) 142/68  Pulse: 71   64  Resp: 18   12  Temp:  (!) 97.5 F (36.4 C) 97.8 F (36.6 C)   TempSrc:  Axillary Axillary   SpO2: 100%   100%  Weight:      Height:      PainSc:        Intake/Output Summary (Last 24 hours) at 05/31/2019 0739 Last data filed at 05/31/2019 0500 Gross per 24 hour  Intake 2750.85 ml  Output 1450 ml  Net 1300.85 ml   Filed Weights   05/10/2019 1831  Weight: 56.2 kg   Weight change:    Intake/Output from previous day: 05/23 0701 - 05/24 0700 In: 2750.9 [I.V.:1892.4; IV Piggyback:858.5] Out: 5397 [Urine:1450] Intake/Output this shift: No intake/output data recorded.  Examination: General exam: Frail, older for stated age, on room air, lethargic, confused, responds with  ahh" moves extremities to painful stimuli  HEENT:Oral mucosa dry, Ear/Nose WNL grossly, dentition normal. Respiratory system: bilaterally clear,no wheezing or crackles,no use of accessory muscle Cardiovascular system: S1 & S2 +, No JVD,. Gastrointestinal system: Abdomen soft, NT,ND, BS+ Nervous System: Lethargic, confused, withdraws to pain opens eyes does not follow command  Extremities: No edema, distal peripheral pulses palpable.  Skin: No rashes,no icterus. MSK: Normal muscle bulk,tone, power Chemo-Port present on the right chest.  Data Reviewed: I have personally reviewed following labs and imaging studies CBC: Recent Labs  Lab 05/25/19 1049 05/25/19 1049 05/11/2019 1531 05/14/2019 1554 05/29/19 0248 05/30/19 0939 05/31/19 0601  WBC 11.9*  --  8.2  --  8.8 8.6 6.7  NEUTROABS 10.2*  --  7.7  --   --   --   --   HGB 10.3*   < > 11.2* 10.9* 8.7* 7.7* 7.5*  HCT 30.3*   < > 31.7* 32.0* 26.1* 23.0* 22.6*    MCV 87.3  --  87.3  --  89.4 90.6 89.7  PLT 75*  --  44*  --  34* 24* 19*   < > = values in this interval not displayed.   Basic Metabolic Panel: Recent Labs  Lab 05/25/19 1049 05/25/19 1049 05/12/2019 1531 05/18/2019 1531 05/20/2019 1554 05/29/19 0248 05/30/19 0939 05/30/19 1114 05/30/19 2114 05/31/19 0601  NA 128*   < > 133*  --  131* 137 140  --   --  140  K 4.6   < > 4.0   < > 3.7 3.5 2.4*  --  2.9* 2.5*  CL 96*   < > 92*  --  95* 109 113*  --   --  112*  CO2 23  --  25  --   --  19* 22  --   --  21*  GLUCOSE 122*   < > 227*  --  213* 181* 149*  --   --  133*  BUN 33*   < > 65*  --  51* 48* 24*  --   --  17  CREATININE 1.79*   < > 2.65*  --  2.70* 1.78* 1.41*  --   --  1.17  CALCIUM 9.0  --  8.8*  --   --  7.5* 7.3*  --   --  7.5*  MG 1.7  --   --   --   --   --   --  1.5*  --   --    < > = values in this interval not displayed.   GFR: Estimated Creatinine Clearance: 42.7 mL/min (by C-G formula based on SCr of 1.17 mg/dL). Liver Function Tests: Recent Labs  Lab 05/25/19 1049 06/07/2019 1531 05/30/19 0939 05/31/19 0601  AST 12* '15 19 18  ' ALT '29 24 25 28  ' ALKPHOS 66 57 49 55  BILITOT 1.0 1.2 0.7 0.7  PROT 6.7 6.7 5.2* 5.1*  ALBUMIN 3.6 3.6 2.5* 2.4*   Recent Labs  Lab 05/08/2019 1531  LIPASE 35   Recent Labs  Lab 05/16/2019 1531  AMMONIA 14   Coagulation Profile: Recent Labs  Lab 05/29/2019 1531  INR 1.2   Cardiac Enzymes: No results for input(s): CKTOTAL, CKMB, CKMBINDEX, TROPONINI in the last 168 hours. BNP (last 3 results) No results for input(s): PROBNP in the last 8760 hours. HbA1C: No results for input(s): HGBA1C in the last 72 hours. CBG: Recent Labs  Lab 05/11/2019 1525 05/30/19 0011  GLUCAP 225* 128*   Lipid Profile: No results for input(s): CHOL, HDL, LDLCALC, TRIG, CHOLHDL, LDLDIRECT in the last 72 hours. Thyroid Function Tests: Recent Labs    06/02/2019 1850  TSH 0.606   Anemia Panel: Recent Labs    05/31/19 0601  VITAMINB12 300    Sepsis Labs: Recent Labs  Lab 05/23/2019 1531 05/22/2019 1727 05/26/2019 2300  LATICACIDVEN 4.1* 3.1* 2.3*    Recent Results (from the past 240 hour(s))  Urine culture     Status: None   Collection Time: 05/09/2019  3:27 PM   Specimen: Urine, Clean Catch  Result Value Ref Range Status   Specimen Description   Final    URINE, CLEAN CATCH Performed at [facility], [place] [address] [place], [place] [address]    Special Requests   Final    NONE Performed at [facility], [place] [address] [place], [place] [address]    Culture   Final    NO GROWTH Performed at [facility], [place] [address] [place], [place] [address]    Report Status 05/30/2019 FINAL  Final  SARS Coronavirus 2 by RT PCR (hospital order, performed in [facility] hospital lab) Nasopharyngeal Nasopharyngeal Swab     Status: None   Collection Time: 06/06/2019  4:12 PM   Specimen: Nasopharyngeal Swab  Result Value Ref Range Status   SARS Coronavirus 2 NEGATIVE NEGATIVE Final    Comment: (NOTE) SARS-CoV-2 target nucleic acids are NOT DETECTED. The SARS-CoV-2 RNA is generally detectable in upper and lower respiratory specimens during the acute phase of infection. The lowest concentration of SARS-CoV-2 viral copies this assay can detect is 250 copies / mL. A negative result does not preclude SARS-CoV-2 infection  and should not be used as the sole basis for treatment or other patient management decisions.  A negative result may occur with improper specimen collection / handling, submission of specimen other than nasopharyngeal swab, presence of viral mutation(s) within the areas targeted by this assay, and inadequate number of viral copies (<250 copies / mL). A negative result must be combined with clinical observations, patient history, and epidemiological information. Fact Sheet for Patients:   StrictlyIdeas.no Fact Sheet for Healthcare  Providers: BankingDealers.co.za This test is not yet approved or cleared  by the [place] FDA and has been authorized for detection and/or diagnosis of SARS-CoV-2 by FDA under an Emergency Use Authorization (EUA).  This EUA will remain in effect (meaning this test can be used) for the duration of the COVID-19 declaration under Section 564(b)(1) of the Act, 21 U.S.C. section 360bbb-3(b)(1), unless the authorization is terminated or revoked sooner. Performed at [facility], [place] [address] [place], [place] [address]   Culture, blood (routine x 2)     Status: None (Preliminary result)   Collection Time: 05/21/2019  4:18 PM   Specimen: BLOOD  Result Value Ref Range Status   Specimen Description   Final    BLOOD SITE NOT SPECIFIED Performed at [place] [address] [place], [place] [address]    Special Requests   Final    BOTTLES DRAWN AEROBIC AND ANAEROBIC Blood Culture adequate volume Performed at [place] [address], [place], [place] [address]    Culture   Final    NO GROWTH 2 DAYS Performed at [place] [address], [place], [place] [address]    Report Status PENDING  Incomplete  MRSA PCR Screening     Status: None   Collection Time: 06/06/2019  6:25 PM   Specimen: Nasal Mucosa; Nasopharyngeal  Result Value Ref Range Status   MRSA by PCR NEGATIVE NEGATIVE Final    Comment:        The GeneXpert MRSA Assay (FDA approved for NASAL specimens only), is one component of a comprehensive MRSA colonization surveillance program. It is not intended to diagnose MRSA infection nor to guide or monitor treatment for MRSA infections. Performed at [facility], [place] [address] [place], [place] [address]   Culture, blood (routine x 2)     Status: None (Preliminary result)   Collection Time: 05/24/2019  6:47 PM   Specimen: BLOOD  Result Value Ref Range Status    Specimen Description   Final    BLOOD RIGHT ANTECUBITAL Performed at [place] [address], [place], [place] [address]    Special Requests   Final    BOTTLES DRAWN AEROBIC AND ANAEROBIC Blood Culture adequate volume Performed at [place] [address], [place], [place] [address]    Culture   Final    NO GROWTH 1 DAY Performed at [facility], [place] [address], [place], [place] [address]    Report Status PENDING  Incomplete  C Difficile Quick Screen w PCR reflex     Status: None   Collection Time: 05/30/19 10:17 PM   Specimen: STOOL  Result Value Ref Range Status   C Diff antigen NEGATIVE NEGATIVE Final   C Diff toxin NEGATIVE NEGATIVE Final   C Diff interpretation No C. difficile detected.  Final    Comment: Performed at [facility], [place] [address], [place], [place] [address]  Radiology Studies: No results found.   LOS: 3 days   Antonieta Pert, MD [facility]  05/31/2019, 7:39 AM

## 2019-05-31 NOTE — Progress Notes (Signed)
PT Cancellation Note  Patient Details Name: Calvin Mccormick MRN: [id number] DOB: 1945-02-26   Cancelled Treatment:    Reason Eval/Treat Not Completed: Patient at procedure or test/unavailable, will check back another time.    Claretha Cooper 05/31/2019, 10:46 AM Welcome Pager [phone] Office [phone]

## 2019-05-31 NOTE — Progress Notes (Signed)
EEG complete - results pending 

## 2019-05-31 NOTE — Progress Notes (Addendum)
[place] PROGRESS NOTE  Patient Care Team: Lucianne Lei, MD as PCP - General (Family Medicine) Heath Lark, MD as Consulting Physician (Hematology and Oncology) Beverely Pace, LCSW as Social Worker Hotel manager) I have seen the patient and agreed with the documentation as follows  ASSESSMENT & PLAN:  Multiple myeloma in relapse [facility]) He is minimally responsive We will hold chemotherapy given his acute metabolic encephalopathy   Pancytopenia, acquired ([place]) Overall,  this has been worsening Chemotherapy was dose adjusted WBC remains normal, but hemoglobin and platelets have continued to drop during hospitalization Transfuse PRBCs for hemoglobin less than 7 and transfuse platelets for platelet count less than 10,000 or active bleeding  Acute metabolic encephalopathy Unclear etiology, likely due to metabolic encephalopathy CT of brain with no acute finding MRI did not reveal signs of stroke EEG completed and results pending   Severe hypokalemia Replete per primary team Likely due to diarrhea  Chronic renal insufficiency, stage III (moderate) His renal function is stable, intermittently elevated due to dehydration Continue IVF  Goals of care  Due to the patient's acute metabolic encephalopathy and persistent pancytopenia, he is not currently a candidate for any additional chemotherapy Appreciate assistance from palliative care team with goals of care discussion Agree with DNR/DNI    Mikey Bussing, NP 05/31/2019 11:02 AM Heath Lark, MD  INTERVAL HISTORY: No family is at the bedside Somnolent this morning Opens eyes but does not answer any questions and does not follow command No bleeding has been noted by nursing  SUMMARY OF ONCOLOGIC HISTORY: Oncology History Overview Note  Progressed on Elotuzumab, revlimid, Velcade, Daratumumab and Pomalyst   Multiple myeloma in relapse ([place])  12/12/2010 Initial Diagnosis   Multiple myeloma   02/16/2014  Imaging   Skeletal survey showed diffuse osteopenia   03/02/2014 Bone Marrow Biopsy   Accession: [id number] BM biopsy showed only 5 % plasma cells. However, the biopsy was difficult and the bone was fragmented during the procedure. Cytogenetics and FISH study is normal   03/03/2014 Procedure   he has port placement   03/10/2014 - 05/19/2014 Chemotherapy   he received elotuzumab and revlimid   05/20/2014 - 02/25/2016 Chemotherapy   Treatment is switched to maintenance Revlimid only. Treatment is stopped due to progressive disease   11/15/2014 Adverse Reaction   He has mild worsening anemia. Does of Revlimid reduced to 5 mg 21 days on, 7 days off   03/06/2016 -  Chemotherapy   He received Daratumumab and Velcade. Velcade is stopped on 07/24/16   03/06/2016 - 02/15/2019 Chemotherapy   The patient had daratumumab with Pomalyst for chemotherapy treatment.     08/14/2016 Miscellaneous   Pomalyst is added along with Daratumumab   10/17/2016 Surgery   EGD was performed for coffee-ground emesis. Multiple linear superficial ulcers were noted in the esophagus from 25-35 cm from insertion. Severe esophagitis was noted from 30-35 cm from insertion. Multiple biopsies were obtained. A small hiatal hernia was noted. The gastric cavity contained coffee-ground fluid, after suctioning and lavage, no obvious gastric lesions/erosion/ulceration/erythema was noted. Biopsies were taken from the antrum to rule out H. Pylori. Duodenum bulb appeared unremarkable. A small diverticulum was noted in second portion of the duodenum.     12/06/2016 Imaging   Ct scan abdomen No acute findings.   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   Stable small hiatal hernia.   Colonic diverticulosis, without radiographic evidence of diverticulitis.     REVIEW OF SYSTEMS:  Unable to obtain ROS secondary to patient condition  I have reviewed the past medical history, past surgical  history, social history and family history with the patient and they are unchanged from previous note.  ALLERGIES:  has No Known Allergies.  MEDICATIONS:  Current Facility-Administered Medications  Medication Dose Route Frequency Provider Last Rate Last Admin   acetaminophen (TYLENOL) tablet 650 mg  650 mg Oral Q6H PRN Kc, Ramesh, MD       Or   acetaminophen (TYLENOL) suppository 650 mg  650 mg Rectal Q6H PRN Kc, Ramesh, MD       albuterol (PROVENTIL) (2.5 MG/3ML) 0.083% nebulizer solution 2.5 mg  2.5 mg Nebulization Q2H PRN Kc, Ramesh, MD       allopurinol (ZYLOPRIM) tablet 100 mg  100 mg Oral BID Kc, Ramesh, MD       brimonidine (ALPHAGAN) 0.2 % ophthalmic solution 1 drop  1 drop Right Eye TID Antonieta Pert, MD   1 drop at 05/31/19 1497   Chlorhexidine Gluconate Cloth 2 % PADS 6 each  6 each Topical Daily Kc, Maren Beach, MD   6 each at 05/30/19 1200   dorzolamide-timolol (COSOPT) 22.3-6.8 MG/ML ophthalmic solution 1 drop  1 drop Right Eye BID Antonieta Pert, MD   1 drop at 05/31/19 0923   famotidine (PEPCID) IVPB 20 mg premix  20 mg Intravenous Q24H Kc, Ramesh, MD       fluorometholone (FML) 0.1 % ophthalmic suspension 1 drop  1 drop Right Eye Daily Kc, Ramesh, MD   1 drop at 05/31/19 0923   latanoprost (XALATAN) 0.005 % ophthalmic solution 1 drop  1 drop Right Eye Cloretta Ned, MD   1 drop at 05/30/19 2115   levothyroxine (SYNTHROID, LEVOTHROID) injection 37.5 mcg  37.5 mcg Intravenous Daily Kc, Ramesh, MD   37.5 mcg at 05/30/19 1042   MEDLINE mouth rinse  15 mL Mouth Rinse BID Kc, Ramesh, MD   15 mL at 05/31/19 0923   ondansetron (ZOFRAN) tablet 4 mg  4 mg Oral Q6H PRN Kc, Maren Beach, MD       Or   ondansetron (ZOFRAN) injection 4 mg  4 mg Intravenous Q6H PRN Kc, Ramesh, MD       sodium chloride 0.45 % 1,000 mL with potassium chloride 40 mEq infusion   Intravenous Continuous Kc, Ramesh, MD 75 mL/hr at 05/31/19 0824 New Bag at 05/31/19 0824   sodium chloride flush (NS) 0.9 % injection 10-40 mL  10-40  mL Intracatheter Q12H Kc, Maren Beach, MD   10 mL at 05/31/19 0263   sodium chloride flush (NS) 0.9 % injection 10-40 mL  10-40 mL Intracatheter PRN Antonieta Pert, MD        PHYSICAL EXAMINATION: ECOG PERFORMANCE STATUS: 2 - Symptomatic, <50% confined to bed  Vitals:   05/31/19 0600 05/31/19 0800  BP: (!) 142/68   Pulse: 64   Resp: 12   Temp:  (!) 97.2 F (36.2 C)  SpO2: 100%    Filed Weights   05/31/2019 1831  Weight: 56.2 kg    GENERAL: Opens eyes, appears comfortable, chronically ill-appearing RESP: Lungs clear to auscultation bilaterally CV: Regular rate and rhythm, no lower extremity edema NEURO: Somnolent, does not respond to questions  LABORATORY DATA:  I have reviewed the data as listed    Component Value Date/Time   NA 140 05/31/2019 0601   NA 140 01/09/2017 0913   K 2.5 (LL) 05/31/2019 0601   K 3.7 01/09/2017 0913   CL 112 (H)  05/31/2019 0601   CL 107 06/29/2012 1131   CO2 21 (L) 05/31/2019 0601   CO2 21 (L) 01/09/2017 0913   GLUCOSE 133 (H) 05/31/2019 0601   GLUCOSE 80 01/09/2017 0913   GLUCOSE 143 (H) 06/29/2012 1131   BUN 17 05/31/2019 0601   BUN 9.8 01/09/2017 0913   CREATININE 1.17 05/31/2019 0601   CREATININE 2.26 (H) 05/11/2019 1025   CREATININE 1.5 (H) 01/09/2017 0913   CALCIUM 7.5 (L) 05/31/2019 0601   CALCIUM 7.3 (L) 04/22/2019 0435   CALCIUM 7.5 (L) 01/09/2017 0913   PROT 5.1 (L) 05/31/2019 0601   PROT 5.7 (L) 01/09/2017 0913   ALBUMIN 2.4 (L) 05/31/2019 0601   ALBUMIN 2.9 (L) 01/09/2017 0913   AST 18 05/31/2019 0601   AST 9 (L) 05/11/2019 1025   AST 12 01/09/2017 0913   ALT 28 05/31/2019 0601   ALT 9 05/11/2019 1025   ALT 10 01/09/2017 0913   ALKPHOS 55 05/31/2019 0601   ALKPHOS 55 01/09/2017 0913   BILITOT 0.7 05/31/2019 0601   BILITOT 0.7 05/11/2019 1025   BILITOT 0.44 01/09/2017 0913   GFRNONAA >60 05/31/2019 0601   GFRNONAA 27 (L) 05/11/2019 1025   GFRAA >60 05/31/2019 0601   GFRAA 31 (L) 05/11/2019 1025    No results found for:  SPEP, UPEP  Lab Results  Component Value Date   WBC 6.7 05/31/2019   NEUTROABS 7.7 06/06/2019   HGB 7.5 (L) 05/31/2019   HCT 22.6 (L) 05/31/2019   MCV 89.7 05/31/2019   PLT 19 (LL) 05/31/2019      Chemistry      Component Value Date/Time   NA 140 05/31/2019 0601   NA 140 01/09/2017 0913   K 2.5 (LL) 05/31/2019 0601   K 3.7 01/09/2017 0913   CL 112 (H) 05/31/2019 0601   CL 107 06/29/2012 1131   CO2 21 (L) 05/31/2019 0601   CO2 21 (L) 01/09/2017 0913   BUN 17 05/31/2019 0601   BUN 9.8 01/09/2017 0913   CREATININE 1.17 05/31/2019 0601   CREATININE 2.26 (H) 05/11/2019 1025   CREATININE 1.5 (H) 01/09/2017 0913      Component Value Date/Time   CALCIUM 7.5 (L) 05/31/2019 0601   CALCIUM 7.3 (L) 04/22/2019 0435   CALCIUM 7.5 (L) 01/09/2017 0913   ALKPHOS 55 05/31/2019 0601   ALKPHOS 55 01/09/2017 0913   AST 18 05/31/2019 0601   AST 9 (L) 05/11/2019 1025   AST 12 01/09/2017 0913   ALT 28 05/31/2019 0601   ALT 9 05/11/2019 1025   ALT 10 01/09/2017 0913   BILITOT 0.7 05/31/2019 0601   BILITOT 0.7 05/11/2019 1025   BILITOT 0.44 01/09/2017 0913

## 2019-05-31 NOTE — TOC Initial Note (Signed)
Transition of Care [facility]) - Initial/Assessment Note    Patient Details  Name: Calvin Mccormick MRN: [id number] Date of Birth: [birth year]  Transition of Care [facility]) CM/SW Contact:    Leeroy Cha, RN Phone Number: 05/31/2019, 8:28 AM  Clinical Narrative:                 Lives at home with son and wife,has pcp, hopefully will go back home may need placement.lethargic this am, palliative care consult done made dnr will continue present aggressive treatment, plan will follow.   Expected Discharge Plan: Home/Self Care Barriers to Discharge: Continued Medical Work up   Patient Goals and CMS Choice Patient states their goals for this hospitalization and ongoing recovery are:: unable to state CMS Medicare.gov Compare Post Acute Care list provided to:: Patient    Expected Discharge Plan and Services Expected Discharge Plan: Home/Self Care   Discharge Planning Services: CM Consult   Living arrangements for the past 2 months: Single Family Home                                      Prior Living Arrangements/Services Living arrangements for the past 2 months: Single Family Home Lives with:: Spouse, Adult Children Patient language and need for interpreter reviewed:: No Do you feel safe going back to the place where you live?: Yes      Need for Family Participation in Patient Care: Yes (Comment) Care giver support system in place?: Yes (comment)   Criminal Activity/Legal Involvement Pertinent to Current Situation/Hospitalization: No - Comment as needed  Activities of Daily Living Home Assistive Devices/Equipment: Cane (specify quad or straight), Dentures (specify type)(full upper and lower plates) ADL Screening (condition at time of admission) Patient's cognitive ability adequate to safely complete daily activities?: No Is the patient deaf or have difficulty hearing?: No Does the patient have difficulty seeing, even when wearing glasses/contacts?: No Does the patient have  difficulty concentrating, remembering, or making decisions?: Yes Patient able to express need for assistance with ADLs?: Yes Does the patient have difficulty dressing or bathing?: Yes Independently performs ADLs?: No Communication: Independent Dressing (OT): Needs assistance Is this a change from baseline?: Change from baseline, expected to last >3 days Grooming: Needs assistance Is this a change from baseline?: Pre-admission baseline Feeding: Independent Bathing: Needs assistance Is this a change from baseline?: Change from baseline, expected to last >3 days Toileting: Needs assistance Is this a change from baseline?: Change from baseline, expected to last >3days In/Out Bed: Needs assistance Is this a change from baseline?: Change from baseline, expected to last >3 days Walks in Home: Needs assistance Is this a change from baseline?: Change from baseline, expected to last >3 days Does the patient have difficulty walking or climbing stairs?: Yes Weakness of Legs: Both Weakness of Arms/Hands: Both  Permission Sought/Granted                  Emotional Assessment Appearance:: Appears stated age Attitude/Demeanor/Rapport: Lethargic Affect (typically observed): Calm Orientation: : Fluctuating Orientation (Suspected and/or reported Sundowners) Alcohol / Substance Use: Not Applicable Psych Involvement: No (comment)  Admission diagnosis:  Dehydration [E86.0] Acute encephalopathy [G93.40] AKI (acute kidney injury) ([place]) [N17.9] Altered mental status, unspecified altered mental status type [R41.82] Patient Active Problem List   Diagnosis Date Noted  . Palliative care encounter   . Acute encephalopathy 05/29/2019  . Multifocal pneumonia   . Sepsis with acute organ  dysfunction without septic shock ([place])   . Hypothyroidism   . Decubitus skin ulcer 04/19/2019  . Community acquired pneumonia 04/18/2019  . CAP (community acquired pneumonia) 04/18/2019  . Hyperkalemia 04/13/2019   . Boil, leg 03/29/2019  . Generalized weakness 03/29/2019  . Drug-induced hyperglycemia 03/23/2019  . Shingles rash 02/15/2019  . Chronic diarrhea 09/28/2018  . Tooth infection 04/13/2018  . Iron deficiency anemia 02/06/2017  . Dry skin dermatitis 02/06/2017  . Esophagitis, acute 12/12/2016  . Acute blood loss anemia 10/18/2016  . Aspiration pneumonia ([place]) 10/16/2016  . AKI (acute kidney injury) ([place]) 10/16/2016  . Upper GI bleed 10/16/2016  . Gait instability 05/22/2016  . Physical debility 05/22/2016  . Pancytopenia, acquired ([place]) 03/27/2016  . Goals of care, counseling/discussion 02/25/2016  . Port catheter in place 01/15/2016  . Acute neck pain 11/23/2015  . Renal failure 04/08/2015  . Acute renal failure (ARF) ([place]) 04/08/2015  . Acute renal failure superimposed on stage 3 chronic kidney disease ([place]) 04/08/2015  . Nausea and vomiting 04/08/2015  . Hypocalcemia 04/08/2015  . Pancytopenia due to antineoplastic chemotherapy ([place]) 11/15/2014  . 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 Protein calorie malnutrition ([place]) 10/06/2014  . Neuropathy due to chemotherapeutic drug ([place]) 02/21/2014  . Cellulitis and abscess of hand 08/05/2011  . Hypomagnesemia 07/29/2011  . Hypokalemia with normal acid-base balance 07/29/2011  . HTN (hypertension) 07/24/2011  . Anemia in chronic renal disease 07/24/2011  . Diarrhea 06/17/2011  . Chronic renal insufficiency, stage III (moderate) 06/17/2011  . Hypokalemia 05/30/2011  . CKD (chronic kidney disease), stage III 02/05/2011  . Multiple myeloma in relapse ([place]) 12/12/2010  . IgG monoclonal gammopathy 11/27/2010   PCP:  Lucianne Lei, MD Pharmacy:   [id number] [place], [place] [address]Phone: [phone]: [phone] [facility] - [address]Phone: [phone]: [phone]  [id number] [place]one: [phone]: [address]one: [phone]: [phone]   Social Determinants of Health (SDAmeryInterventions    Readmission Risk Interventions No flowsheet data found.

## 2019-05-31 NOTE — Procedures (Signed)
Patient Name: Calvin Mccormick  MRN: [id number]  Epilepsy Attending: Lora Havens  Referring Physician/Provider: Dr. Antonieta Pert Date: 05/31/2019 Duration: 22.33 minutes  Patient history: 74 year old male with altered mental status.  EEG tolerated for seizures.  Level of alertness: Lethargic, sleep  AEDs during EEG study: None  Technical aspects: This EEG study was done with scalp electrodes positioned according to the 10-20 International system of electrode placement. Electrical activity was acquired at a sampling rate of 500Hz  and reviewed with a high frequency filter of 70Hz  and a low frequency filter of 1Hz . EEG data were recorded continuously and digitally stored.   Description: No clear posterior dominant rhythm was seen.  Sleep was characterized by vertex waves, sleep spindles (12 to 14 Hz), maximal frontocentral region.  EEG showed continuous generalized 3 to 6 Hz theta-delta slowing. Hyperventilation and photic stimulation were not performed.     ABNORMALITY -Continuous slow, generalized  IMPRESSION: This study is suggestive of moderate to severe diffuse encephalopathy, nonspecific etiology. No seizures or epileptiform discharges were seen throughout the recording.  Tenise Stetler Barbra Sarks

## 2019-06-01 LAB — CBC
HCT: 27.4 % — ABNORMAL LOW (ref 39.0–52.0)
Hemoglobin: 9.1 g/dL — ABNORMAL LOW (ref 13.0–17.0)
MCH: 29.7 pg (ref 26.0–34.0)
MCHC: 33.2 g/dL (ref 30.0–36.0)
MCV: 89.5 fL (ref 80.0–100.0)
Platelets: 18 10*3/uL — CL (ref 150–400)
RBC: 3.06 MIL/uL — ABNORMAL LOW (ref 4.22–5.81)
RDW: 23.1 % — ABNORMAL HIGH (ref 11.5–15.5)
WBC: 7.5 10*3/uL (ref 4.0–10.5)
nRBC: 0 % (ref 0.0–0.2)

## 2019-06-01 LAB — COMPREHENSIVE METABOLIC PANEL
ALT: 27 U/L (ref 0–44)
AST: 18 U/L (ref 15–41)
Albumin: 2.9 g/dL — ABNORMAL LOW (ref 3.5–5.0)
Alkaline Phosphatase: 63 U/L (ref 38–126)
Anion gap: 9 (ref 5–15)
BUN: 20 mg/dL (ref 8–23)
CO2: 16 mmol/L — ABNORMAL LOW (ref 22–32)
Calcium: 8.8 mg/dL — ABNORMAL LOW (ref 8.9–10.3)
Chloride: 112 mmol/L — ABNORMAL HIGH (ref 98–111)
Creatinine, Ser: 1.4 mg/dL — ABNORMAL HIGH (ref 0.61–1.24)
GFR calc Af Amer: 56 mL/min — ABNORMAL LOW (ref >60)
GFR calc non Af Amer: 48 mL/min — ABNORMAL LOW (ref >60)
Glucose, Bld: 100 mg/dL — ABNORMAL HIGH (ref 70–99)
Potassium: 4.2 mmol/L (ref 3.5–5.1)
Sodium: 137 mmol/L (ref 135–145)
Total Bilirubin: 0.8 mg/dL (ref 0.3–1.2)
Total Protein: 6 g/dL — ABNORMAL LOW (ref 6.5–8.1)

## 2019-06-01 MED ORDER — LOPERAMIDE HCL 2 MG PO CAPS
2.0000 mg | ORAL_CAPSULE | Freq: Three times a day (TID) | ORAL | Status: AC
  Administered 2019-06-01 (×3): 2 mg via ORAL
  Filled 2019-06-01 (×3): qty 1

## 2019-06-01 NOTE — Evaluation (Signed)
Clinical/Bedside Swallow Evaluation Patient Details  Name: Calvin Mccormick MRN: [id number] Date of Birth: [birth year]  Today's Date: 06/01/2019 Time: SLP Start Time (ACUTE ONLY): 1425 SLP Stop Time (ACUTE ONLY): 1450 SLP Time Calculation (min) (ACUTE ONLY): 25 min  Past Medical History:  Past Medical History:  Diagnosis Date  . Anemia 11/27/2010  . Cancer ([place])   . Chronic renal insufficiency, stage III (moderate) 06/17/2011   Due to myeloma & HTN  . CKD (chronic kidney disease), stage III 02/05/2011  . 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 Diarrhea 06/17/2011   Hospital admission 05/30/11 velcade toxicity? Infectious?  Marland Kitchen Hyperlipemia   . Hypertension, benign essential, goal below 140/90 11/27/2010  . Hypokalemia with normal acid-base balance 07/29/2011  . Hypomagnesemia 07/29/2011  . Seasonal allergies    Past Surgical History:  Past Surgical History:  Procedure Laterality Date  . COLONOSCOPY  07/26/2011   Procedure: COLONOSCOPY;  Surgeon: Cleotis Nipper, MD;  Location: [facility];  Service: Endoscopy;  Laterality: N/A;  . ESOPHAGOGASTRODUODENOSCOPY (EGD) WITH PROPOFOL Left 10/17/2016   Procedure: ESOPHAGOGASTRODUODENOSCOPY (EGD) WITH PROPOFOL;  Surgeon: Ronnette Juniper, MD;  Location: [facility] END[facility];  Service: Gastroenterology;  Laterality: Left;  . EYE SURGERY     bilateral cataract  surgery  . MULTIPLE EXTRACTIONS WITH ALVEOLOPLASTY N/A 05/27/2014   Procedure: Extraction of tooth #'s 1,6,7,8,10,11,13,21,22,23,24,25,26,27 and 28 with alveoloplasty;  Surgeon: Lenn Cal, DDS;  Location: [facility]S;  Service: Oral Surgery;  Laterality: N/A;  . porta cath      for chemotherapy- right chest area   HPI:  74yo male admitted from home 06/05/2019 with confusion. PMH: multiple myeloma, anemia, chronic diarrhea, CKD3, HTN, HLD. MRI = no evidence of recent infarction, hemorrhage or mass. Chronic microvascular ischemic changes   Assessment / Plan / Recommendation Clinical Impression  Pt presents with  edentulous oral cavity. Mouth is moist, tongue surface white. Oral care completed with suction. Pt speech is poorly intelligible, due to generalized oral motor weakness. Trials of thin liquid and puree were tolerated without overt s/s aspiration, however, he exhibits extended oral prep and oral holding of puree textures. Solids not given at this time for this reason. Volitional cough noted to be weak.   Recommend puree diet with thin liquids, meds crushed in puree. SLP will follow up at bedside for assessment of diet tolerance and readiness to advance. Safe swallow precautions posted at [facility]. MD and RN informed.    SLP Visit Diagnosis: Dysphagia, unspecified (R13.10)    Aspiration Risk  Mild aspiration risk;Risk for inadequate nutrition/hydration    Diet Recommendation Dysphagia 1 (Puree);Thin liquid   Liquid Administration via: Straw Medication Administration: Crushed with puree Supervision: Full supervision/cueing for compensatory strategies;Staff to assist with self feeding Compensations: Slow rate;Small sips/bites Postural Changes: Seated upright at 90 degrees    Other  Recommendations Oral Care Recommendations: Oral care BID   Follow up Recommendations 24 hour supervision/assistance      Frequency and Duration min 2x/week  1 week;2 weeks       Prognosis Prognosis for Safe Diet Advancement: Fair      Swallow Study   General Date of Onset: 05/22/2019 HPI: 74yo male admitted from home 05/24/2019 with confusion. PMH: multiple myeloma, anemia, chronic diarrhea, CKD3, HTN, HLD. MRI = no evidence of recent infarction, hemorrhage or mass. Chronic microvascular ischemic changes Type of Study: Bedside Swallow Evaluation Previous Swallow Assessment: none Diet Prior to this Study: NPO Temperature Spikes Noted: No Respiratory Status: Room air History of Recent Intubation: No Behavior/Cognition:  Alert;Cooperative;Requires cueing Oral Cavity Assessment: Within Functional Limits Oral Care  Completed by SLP: Yes Oral Cavity - Dentition: Edentulous Self-Feeding Abilities: Total assist Patient Positioning: Upright in chair Baseline Vocal Quality: Normal Volitional Cough: Weak Volitional Swallow: Able to elicit    Oral/Motor/Sensory Function Overall Oral Motor/Sensory Function: Generalized oral weakness(moderate weakness -poorly intelligible speech)   Ice Chips Ice chips: Within functional limits Presentation: Spoon   Thin Liquid Thin Liquid: Within functional limits Presentation: Straw    Nectar Thick Nectar Thick Liquid: Not tested   Honey Thick Honey Thick Liquid: Not tested   Puree Puree: Impaired Presentation: Spoon Oral Phase Impairments: Impaired mastication;Poor awareness of bolus Oral Phase Functional Implications: Oral holding;Prolonged oral transit;Oral residue   Solid     Solid: Not tested     Calvin Mccormick B. Quentin Ore, [place], [place] Speech Language Pathologist Office: [phone]  Shonna Chock 06/01/2019,2:57 PM

## 2019-06-01 NOTE — Evaluation (Signed)
Physical Therapy Evaluation Patient Details Name: Calvin Mccormick MRN: [id number] DOB: 1945/05/12 Today's Date: 06/01/2019   History of Present Illness  y.o. male with medical history significant for multiple myeloma in relapse currently on chemotherapy, CKD , anemia/pancytopenia, chronic diarrhea, hypertension, hyperlipidemia brought to the ED 5/21/21with unresponsiveness, acute metabolic encephalopathy  Clinical Impression  The patient is very flat and lethargic. Does answer slowly. Follows directions inconsistently. Level of function unknown, appears getting cancer treatments. No family present. Patient is very deconditioned and required max assistance to transfer to recliner. Pt admitted with above diagnosis. Pt currently with functional limitations due to the deficits listed below (see PT Problem List). Pt will benefit from skilled PT to increase their independence and safety with mobility to allow discharge to the venue listed below.       Follow Up Recommendations SNF;Supervision/Assistance - 24 hour    Equipment Recommendations    ? WC    Recommendations for Other Services   OT   Precautions / Restrictions Precautions Precautions: Fall Restrictions Weight Bearing Restrictions: No      Mobility  Bed Mobility Overal bed mobility: Needs Assistance Bed Mobility: Supine to Sit     Supine to sit: Max assist     General bed mobility comments: extra time and tactile cues to move legs. assist with trunk to sit upright.  Transfers Overall transfer level: Needs assistance   Transfers: Sit to/from Stand;Stand Pivot Transfers Sit to Stand: Max assist Stand pivot transfers: Max assist       General transfer comment: assist to rise, bear hug technique, shuffle steps to recliner.  Ambulation/Gait                Stairs            Wheelchair Mobility    Modified Rankin (Stroke Patients Only)       Balance Overall balance assessment: History of Falls;Needs  assistance Sitting-balance support: Bilateral upper extremity supported;Feet supported Sitting balance-Leahy Scale: Fair     Standing balance support: Bilateral upper extremity supported;During functional activity Standing balance-Leahy Scale: Poor                               Pertinent Vitals/Pain Pain Assessment: Faces Faces Pain Scale: No hurt    Home Living Family/patient expects to be discharged to:: Private residence   Available Help at Discharge: Family;Available 24 hours/day Type of Home: House Home Access: Ramped entrance     Home Layout: One level Home Equipment: Grab bars - tub/shower;Walker - 2 wheels;Cane - single point Additional Comments: info from previous encounter as pt. unable to provide    Prior Function           Comments: unsure of recent mobility. family not present     Hand Dominance   Dominant Hand: Right    Extremity/Trunk Assessment   Upper Extremity Assessment Upper Extremity Assessment: Generalized weakness    Lower Extremity Assessment Lower Extremity Assessment: Generalized weakness    Cervical / Trunk Assessment Cervical / Trunk Assessment: Kyphotic  Communication   Communication: Other (comment)(speach is barely audible, slurred)  Cognition Arousal/Alertness: Lethargic Behavior During Therapy: Flat affect Overall Cognitive Status: History of cognitive impairments - at baseline Area of Impairment: Orientation;Attention;Memory;Following commands;Safety/judgement;Awareness                 Orientation Level: Time;Situation Current Attention Level: Focused   Following Commands: Follows one step commands inconsistently;Follows one step commands  with increased time Safety/Judgement: Decreased awareness of deficits;Decreased awareness of safety Awareness: Intellectual   General Comments: pt tending to have right gaze favor, does not really make eye contact but does see objects.      General Comments       Exercises     Assessment/Plan    PT Assessment Patient needs continued PT services  PT Problem List Decreased strength;Decreased balance;Decreased cognition;Decreased knowledge of precautions;Decreased mobility;Decreased knowledge of use of DME;Decreased activity tolerance;Decreased safety awareness       PT Treatment Interventions Therapeutic activities;DME instruction;Gait training;Therapeutic exercise;Patient/family education;Functional mobility training    PT Goals (Current goals can be found in the Care Plan section)  Acute Rehab PT Goals PT Goal Formulation: Patient unable to participate in goal setting Time For Goal Achievement: 06/15/19 Potential to Achieve Goals: Fair    Frequency Min 2X/week   Barriers to discharge  ? decvreased caregivers      Co-evaluation               AM-PAC PT "6 Clicks" Mobility  Outcome Measure Help needed turning from your back to your side while in a flat bed without using bedrails?: A Lot Help needed moving from lying on your back to sitting on the side of a flat bed without using bedrails?: A Lot Help needed moving to and from a bed to a chair (including a wheelchair)?: Total Help needed standing up from a chair using your arms (e.g., wheelchair or bedside chair)?: Total Help needed to walk in hospital room?: Total Help needed climbing 3-5 steps with a railing? : Total 6 Click Score: 8    End of Session Equipment Utilized During Treatment: Gait belt Activity Tolerance: Patient limited by fatigue;Patient limited by lethargy Patient left: in chair;with call bell/phone within reach;with chair alarm set Nurse Communication: Mobility status(patient's port leaking-it was disloged.) PT Visit Diagnosis: Unsteadiness on feet (R26.81);Muscle weakness (generalized) (M62.81);Adult, failure to thrive (R62.7)    Time: 3754-3606 PT Time Calculation (min) (ACUTE ONLY): 49 min   Charges:   PT Evaluation $PT Eval Moderate Complexity: 1  Mod PT Treatments $Therapeutic Activity: 23-37 mins        Tresa Endo PT Acute Rehabilitation Services Pager [phone] Office [phone]   Claretha Cooper 06/01/2019, 1:51 PM

## 2019-06-01 NOTE — Progress Notes (Signed)
VAST consulted to re-access port as pt accidentally pulled out needle. Pt is confused and kept trying to get out of the chair while this RN was setting up to re-access port.  Re-accessed port per protocol and positioned tubing up over pt's shoulder. Notified nurse that pt trying to get out of chair. Nurse to bedside before this RN left the room.

## 2019-06-01 NOTE — Progress Notes (Signed)
Calvin Mccormick   DOB:01/30/1945   HY#:[id number]    ASSESSMENT & PLAN:  Multiple myeloma in relapse ([place]) His mental status has improved I do not believe his recent chemotherapy is the cause of altered mental status but it contributed to multiple different side effects such as pancytopenia, diarrhea which in turn cause electrolyte abnormalities and eventually contributed to metabolic encephalopathy Overall, he tolerated chemotherapy very poorly I will discontinue his treatment and recommend we focus on supportive care only  Pancytopenia, acquired ([place]) WBC remains normal, but hemoglobin and platelets have continued to drop during hospitalization, likely secondary to hemodilution Transfuse PRBCs for hemoglobin less than 7 and transfuse platelets for platelet count less than 10,000 or active bleeding  Acute metabolic encephalopathy, slowly improving Unclear etiology, likely due to metabolic encephalopathy CT of brain with no acute finding MRI did not reveal signs of stroke EEG showed diffuse slowing Continue supportive care  Severe hypokalemia, likely secondary to diarrhea Replete per primary team  Severe diarrhea Likely secondary to side effects of treatment Testing for C. difficile is negative I recommend scheduled Imodium for 2 days  Decubitus ulcer Continue wound dressing changes  Chronic renal insufficiency, stage III (moderate) His renal function isstable, intermittently elevateddue to dehydration Continue IVF  Goals of care  Due to the patient's acute metabolic encephalopathy and persistent pancytopenia, he is not currently a candidate for any additional chemotherapy Appreciate assistance from palliative care team with goals of care discussion Agree with DNR/DNI  Discharge planning From his previous hospitalization, his daughter was offended at the mention of possible skilled nursing facility/rehabilitation Hopefully, once his mental status continues to improve, he can  return back to home under the care of his family members  We will continue to follow  Heath Lark, MD 06/01/2019 7:35 AM  Subjective:  The patient is sitting up in his bed He recognizes me as his oncologist He denies pain EEG results and MRI results are reviewed  Objective:  Vitals:   06/01/19 0049 06/01/19 0400  BP: 117/87 128/83  Pulse: 74 75  Resp: 12 (!) 22  Temp:  97.6 F (36.4 C)  SpO2: 100% 100%     Intake/Output Summary (Last 24 hours) at 06/01/2019 0735 Last data filed at 05/31/2019 1839 Gross per 24 hour  Intake 1208.77 ml  Output 375 ml  Net 833.77 ml    GENERAL:alert, no distress and comfortable NEURO: alert & oriented x 3 with fluent speech   Labs:  Recent Labs    04/21/19 0941 04/22/19 0435 05/30/19 0939 05/30/19 2114 05/31/19 0601 05/31/19 1438 06/01/19 0251  NA  --    < > 140  --  140  --  137  K  --    < > 2.4*   < > 2.5* 3.4* 4.2  CL  --    < > 113*  --  112*  --  112*  CO2  --    < > 22  --  21*  --  16*  GLUCOSE  --    < > 149*  --  133*  --  100*  BUN  --    < > 24*  --  17  --  20  CREATININE  --    < > 1.41*  --  1.17  --  1.40*  CALCIUM  --    < > 7.3*  --  7.5*  --  8.8*  GFRNONAA  --    < > 48*  --  >60  --  48*  GFRAA  --    < > 56*  --  >60  --  56*  PROT 5.5*   < > 5.2*  --  5.1*  --  6.0*  ALBUMIN 2.4*   < > 2.5*  --  2.4*  --  2.9*  AST 17   < > 19  --  18  --  18  ALT 30   < > 25  --  28  --  27  ALKPHOS 49   < > 49  --  55  --  63  BILITOT 0.3   < > 0.7  --  0.7  --  0.8  BILIDIR 0.1  --   --   --   --   --   --   IBILI 0.2*  --   --   --   --   --   --    < > = values in this interval not displayed.    Studies:  CT Head Wo Contrast  Result Date: 05/14/2019 CLINICAL DATA:  Unresponsive, failure to thrive, multiple myeloma EXAM: CT HEAD WITHOUT CONTRAST TECHNIQUE: Contiguous axial images were obtained from the base of the skull through the vertex without intravenous contrast. COMPARISON:  None. FINDINGS: Brain: There is  scattered hypodensities throughout the periventricular white matter consistent with age-indeterminate small vessel ischemic changes. No other signs of acute infarct or hemorrhage. Lateral ventricles and midline structures are unremarkable. No acute extra-axial fluid collections. No mass effect. Vascular: No hyperdense vessel or unexpected calcification. Skull: Normal. Negative for fracture or focal lesion. Sinuses/Orbits: No acute finding. Other: None. IMPRESSION: 1. Age-indeterminate small vessel ischemic changes throughout the periventricular white matter. No acute hemorrhage. Electronically Signed   By: Randa Ngo M.D.   On: 05/25/2019 17:04   MR BRAIN WO CONTRAST  Result Date: 05/31/2019 CLINICAL DATA:  Encephalopathy EXAM: MRI HEAD WITHOUT CONTRAST TECHNIQUE: Multiplanar, multiecho pulse sequences of the brain and surrounding structures were obtained without intravenous contrast. COMPARISON:  None. FINDINGS: Brain: There is no acute infarction or intracranial hemorrhage. There is no intracranial mass, mass effect, or edema. There is no hydrocephalus or extra-axial fluid collection. Prominence of the ventricles and sulci reflects generalized parenchymal volume loss. Patchy T2 hyperintensity in the supratentorial and pontine white matter is nonspecific but may reflect mild chronic microvascular ischemic changes. Vascular: Major vessel flow voids at the skull base are preserved. Skull and upper cervical spine: Normal marrow signal is preserved. Sinuses/Orbits: Paranasal sinuses are aerated. No significant orbital finding. Other: Sella is unremarkable. Mild patchy right mastoid opacification. IMPRESSION: No evidence of recent infarction, hemorrhage, or mass. Chronic microvascular ischemic changes. Electronically Signed   By: Macy Mis M.D.   On: 05/31/2019 11:03   DG Chest Port 1 View  Result Date: 05/18/2019 CLINICAL DATA:  Altered mental status. EXAM: PORTABLE CHEST 1 VIEW COMPARISON:  Kjell Brannen 11,  2021 FINDINGS: There is stable right-sided venous Port-A-Cath positioning. Mildly decreased lung volumes are seen which is likely secondary to suboptimal patient inspiration. There is no evidence of acute infiltrate, pleural effusion or pneumothorax. The heart size and mediastinal contours are within normal limits. The visualized skeletal structures are unremarkable. IMPRESSION: No active disease. Electronically Signed   By: Virgina Norfolk M.D.   On: 05/14/2019 16:41   EEG adult  Result Date: 05/31/2019 Lora Havens, MD     05/31/2019 11:11 AM Patient Name: ALFONS SULKOWSKI MRN: [id number] Epilepsy Attending: Lora Mccormick Referring Physician/Provider: Dr. Maren Beach  Kc Date: 05/31/2019 Duration: 22.33 minutes Patient history: 74 year old male with altered mental status.  EEG tolerated for seizures. Level of alertness: Lethargic, sleep AEDs during EEG study: None Technical aspects: This EEG study was done with scalp electrodes positioned according to the 10-20 International system of electrode placement. Electrical activity was acquired at a sampling rate of '500Hz'  and reviewed with a high frequency filter of '70Hz'  and a low frequency filter of '1Hz' . EEG data were recorded continuously and digitally stored. Description: No clear posterior dominant rhythm was seen.  Sleep was characterized by vertex waves, sleep spindles (12 to 14 Hz), maximal frontocentral region.  EEG showed continuous generalized 3 to 6 Hz theta-delta slowing. Hyperventilation and photic stimulation were not performed.   ABNORMALITY -Continuous slow, generalized IMPRESSION: This study is suggestive of moderate to severe diffuse encephalopathy, nonspecific etiology. No seizures or epileptiform discharges were seen throughout the recording. Priyanka Barbra Sarks

## 2019-06-01 NOTE — Progress Notes (Signed)
PMT no charge note  Patient seen sitting up in a chair briefly in his room this afternoon. No family at bedside. Note PT and SLP recommendations. Dr Alvy Bimler note reviewed. Labs and imaging reviewed, chart reviewed.  From a PMT standpoint, recommend home with home based palliative care for additional support. Otherwise, continue current mode of care.   Loistine Chance MD [place] [facility] [phone]

## 2019-06-01 NOTE — Plan of Care (Signed)
  Problem: Education: Goal: Knowledge of General Education information will improve Description: Including pain rating scale, medication(s)/side effects and non-pharmacologic comfort measures Outcome: Not Progressing   Problem: Health Behavior/Discharge Planning: Goal: Ability to manage health-related needs will improve Outcome: Not Progressing   

## 2019-06-01 NOTE — Progress Notes (Signed)
PROGRESS NOTE    Calvin Mccormick  ZOX:[id number] DOB: [birth year] DOA: 05/21/2019 PCP: Lucianne Lei, MD   Chef Complaints:unresponsiveness. Brief Narrative: 74 y.o. male with medical history significant for multiple myeloma in relapse currently on chemotherapy-dexamethasone 16 mg every Tuesday and Thursday and selinexor at 60 mg every Tuesday and Thursday followed by Dr. 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  Patient unable to provide history and presentation.  As per family patient developed significant nausea and vomiting after taking "stomach pills" yesterday afternoon and became less responsive and since then has been intermittently unresponsive sometimes walking up, not been eating well for the past 2 days.  No reported fever, chest pain, nausea, vomiting, fever, chills,, focal weakness, dysuria.  In ED:On presentation patient was minimally responsive, vitals stable saturating well on room air, temperature 97.4, blood work showed hyponatremia, worsening renal failure lactic acid is 4.1, hemoglobin 10.9 g, platelet 44,000.  Patient was given 1800 mL of fluid about to finish his bolus, since starting fluid patient has perked up is much more alert awake oriented knows he is in the hospital. When I saw his wife is at the bedside helping with history.  Last x-ray and CT head no acute findings. Repeat lactic acid has been sent, UA VBG, Covid screening pending. Patient is admitted with empiric antibiotics and aggressive IV fluid hydration.   BUN/creatinine is improving but patient remains significantly encephalopathic.  Subjective:  Sitting on the bedside chair alert awake oriented to self, place but confused intermittently. Able to move his extremities. Rectal tube remains in place with loose stool.  Assessment &  Plan:  Acute metabolic encephalopathy: Likely due to severe dehydration/uremic symptoms on admission.  CT head, MRI brain no acute finding.  EEG showed moderate to severe diffuse encephalopathy, B12 300 and ammonia level normal, TSH 0.6.  I had discussed with Dr. Rory Percy from neurology.  Mental status seems to be overall improving but is still confused.  Continue PT OT supportive care.  No obvious infection on UA and chest x-ray blood culture negative and antibiotics were discontinued 5/24 as he was having diarrhea.  Continue gentle IVF while NPO.  Speech eval for p.o.  Nausea and vomiting: No recurrence here:Continue Pepcid, supportive care.   Diarrhea continue rectal tube, C. difficile negative.  Stopping antibiotics as #1.Continue Imodium as scheduled x2 days.  Severe dehydration due to #1: Adequately rehydrated with IV fluids.  At risk of dehydration continue gentle IV fluids until able to take p.o. speech eval  AKI on CKD stage  IIIa: Has baseline CKD from multiple myeloma with creatinine 1.2-1.6, bun IN 10s in 04/2019,slowly worsening recently  1.7-2.2.Significant dehydration AKI and uremia on admission improved to 22/1.41 continue IV fluids while npo and having diarrhea  Recent Labs  Lab 05/23/2019 1554 05/29/19 0248 05/30/19 0939 05/31/19 0601 06/01/19 0251  BUN 51* 48* 24* 17 20  CREATININE 2.70* 1.78* 1.41* 1.17 1.40*   Hypokalemia: Resolved with repletion.  Continue IVF w/ KCl 40 M EQ    Recent Labs  Lab 05/30/19 0939 05/30/19 2114 05/31/19 0601 05/31/19 1438 06/01/19 0251  K 2.4* 2.9* 2.5* 3.4* 4.2   Hypomagnesemia-repleted.  Lactic acidosis due to severe dehydration improved with IV fluids.  Bicarb at 21.  Continue IV fluids.   off abx since no obvious infection detected and cultures negative   Hypotonic hyponatremia sodium  improved with fluids.  Pancytopenia with anemia and thrombocytopenia: In the setting of multiple myeloma, chemotherapy.  Remains low,stabilizing,  continue to monitor.Avoid heparin product due to low platelet count.Oncology on board and appreciate input.And advised Platelets transfusion if less than 10,000 or if any active bleeding noted.  Currently no signs of bleeding hemoglobin is up. normally around 9 g at baseline on admission hemoconcentrated and high at 10-11. Transfuse prbc as needed < 7 gm.  Recent Labs  Lab 05/11/2019 1531 05/29/19 0248 05/30/19 0939 05/31/19 0601 06/01/19 0251  PLT 44* 34* 24* 19* 18*   Recent Labs  Lab 05/26/2019 1554 05/29/19 0248 05/30/19 0939 05/31/19 0601 06/01/19 0251  HGB 10.9* 8.7* 7.7* 7.5* 9.1*  HCT 32.0* 26.1* 23.0* 22.6* 27.4*   Multiple Myeloma in relapse currently on chemo,dexamethasone 16 mg every Tuesday and Thursday and selinexor at 60 mg every Tuesday and Thursday followed by Dr. Alvy Bimler.  Oncology team is following closely.  Appreciate input.   Hypothyroidism:Euthyroid TSH 0.6.Continue iv synthroid  Goals of care: discussed his CODE STATUS on admission with patient and his wife and per discussion DNR.  This has been communicated with patient's family.  Palliative care has been involved.  DVT prophylaxis:SCD. Code Status:DNR. Family Communication:I have updated patient's family and admissions and subsequent days (wife and son, daughter phone goes to voice mail tied few times). Palliative care has been following closely.   Status is: Inpatient Remains inpatient appropriate because:Persistent severe electrolyte disturbances, IV treatments appropriate due to intensity of illness or inability to take PO and Inpatient level of care appropriate due to severity of illness  Dispo: The patient is from: Home              Anticipated d/c is to: TBD              Anticipated d/c date is:  2 days              Patient currently is not medically stable to d/c. Nutrition: Diet Order            Diet NPO time specified  Diet effective now             Body mass index is 17.28  kg/m.  Consultants:see note  Procedures:  BUL:AGTX study is suggestive of moderate to severe diffuse encephalopathy, nonspecific etiology. No seizures or epileptiform discharges were seen throughout the recording  Microbiology:see note  Medications: Scheduled Meds: . allopurinol  100 mg Oral BID  . brimonidine  1 drop Right Eye TID  . Chlorhexidine Gluconate Cloth  6 each Topical Daily  . dorzolamide-timolol  1 drop Right Eye BID  . fluorometholone  1 drop Right Eye Daily  . latanoprost  1 drop Right Eye QHS  . levothyroxine  37.5 mcg Intravenous Daily  . loperamide  2 mg Oral TID  . mouth rinse  15 mL Mouth Rinse BID  . sodium chloride flush  10-40 mL Intracatheter Q12H   Continuous Infusions: . famotidine (PEPCID) IV 20 mg (06/01/19 1058)  . sodium chloride 0.45 % with kcl 75 mL/hr at 05/31/19 2320    Antimicrobials: Anti-infectives (From admission, onward)   Start     Dose/Rate Route Frequency Ordered Stop   05/29/19 1600  ceFEPIme (MAXIPIME) 2 g in sodium chloride 0.9 % 100 mL IVPB  Status:  Discontinued     2 g 200 mL/hr over 30 Minutes Intravenous Every 24 hours 05/08/2019 1916 05/31/19 0754   05/11/2019 1630  ceFEPIme (MAXIPIME) 2  g in sodium chloride 0.9 % 100 mL IVPB     2 g 200 mL/hr over 30 Minutes Intravenous  Once 05/21/2019 1619 05/27/2019 1803       Objective: Vitals: Today's Vitals   06/01/19 0000 06/01/19 0049 06/01/19 0400 06/01/19 0736  BP: (!) 141/101 117/87 128/83   Pulse: 81 74 75   Resp: 15 12 (!) 22   Temp: 97.7 F (36.5 C)  97.6 F (36.4 C) (!) 97.4 F (36.3 C)  TempSrc: Oral  Oral Axillary  SpO2: 100% 100% 100%   Weight:      Height:      PainSc:        Intake/Output Summary (Last 24 hours) at 06/01/2019 1106 Last data filed at 06/01/2019 8185 Gross per 24 hour  Intake 1208.77 ml  Output 1075 ml  Net 133.77 ml   Filed Weights   05/24/2019 1831  Weight: 56.2 kg   Weight change:    Intake/Output from previous day: 05/24 0701 - 05/25  0700 In: 1208.8 [I.V.:768.2; IV Piggyback:440.6] Out: 375 [Stool:375] Intake/Output this shift: Total I/O In: -  Out: 700 [Urine:700]  Examination:  General exam: AAO to self, place, NAD, weak appearing. HEENT:Oral mucosa moist, Ear/Nose WNL grossly, dentition normal. Respiratory system: bilaterally clear,no wheezing or crackles,no use of accessory muscle Cardiovascular system: S1 & S2 +, No JVD,. Gastrointestinal system: Abdomen soft, NT,ND, BS+ Nervous System: More alert awake today, moving extremities and grossly nonfocal Extremities: No edema, distal peripheral pulses palpable.  Skin: No rashes,no icterus. MSK: Normal muscle bulk,tone, power Rectal tube in place.  Data Reviewed: I have personally reviewed following labs and imaging studies CBC: Recent Labs  Lab 05/24/2019 1531 05/30/2019 1531 05/18/2019 1554 05/29/19 0248 05/30/19 0939 05/31/19 0601 06/01/19 0251  WBC 8.2  --   --  8.8 8.6 6.7 7.5  NEUTROABS 7.7  --   --   --   --   --   --   HGB 11.2*   < > 10.9* 8.7* 7.7* 7.5* 9.1*  HCT 31.7*   < > 32.0* 26.1* 23.0* 22.6* 27.4*  MCV 87.3  --   --  89.4 90.6 89.7 89.5  PLT 44*  --   --  34* 24* 19* 18*   < > = values in this interval not displayed.   Basic Metabolic Panel: Recent Labs  Lab 06/01/2019 1531 05/17/2019 1531 05/09/2019 1554 05/29/2019 1554 05/29/19 0248 05/29/19 0248 05/30/19 0939 05/30/19 1114 05/30/19 2114 05/31/19 0601 05/31/19 1438 06/01/19 0251  NA 133*   < > 131*  --  137  --  140  --   --  140  --  137  K 4.0   < > 3.7   < > 3.5   < > 2.4*  --  2.9* 2.5* 3.4* 4.2  CL 92*   < > 95*  --  109  --  113*  --   --  112*  --  112*  CO2 25  --   --   --  19*  --  22  --   --  21*  --  16*  GLUCOSE 227*   < > 213*  --  181*  --  149*  --   --  133*  --  100*  BUN 65*   < > 51*  --  48*  --  24*  --   --  17  --  20  CREATININE 2.65*   < > 2.70*  --  1.78*  --  1.41*  --   --  1.17  --  1.40*  CALCIUM 8.8*  --   --   --  7.5*  --  7.3*  --   --  7.5*  --   8.8*  MG  --   --   --   --   --   --   --  1.5*  --   --   --   --    < > = values in this interval not displayed.   GFR: Estimated Creatinine Clearance: 35.7 mL/min (A) (by C-G formula based on SCr of 1.4 mg/dL (H)). Liver Function Tests: Recent Labs  Lab 05/12/2019 1531 05/30/19 0939 05/31/19 0601 06/01/19 0251  AST '15 19 18 18  ' ALT '24 25 28 27  ' ALKPHOS 57 49 55 63  BILITOT 1.2 0.7 0.7 0.8  PROT 6.7 5.2* 5.1* 6.0*  ALBUMIN 3.6 2.5* 2.4* 2.9*   Recent Labs  Lab 05/18/2019 1531  LIPASE 35   Recent Labs  Lab 05/25/2019 1531  AMMONIA 14   Coagulation Profile: Recent Labs  Lab 05/27/2019 1531  INR 1.2   Cardiac Enzymes: No results for input(s): CKTOTAL, CKMB, CKMBINDEX, TROPONINI in the last 168 hours. BNP (last 3 results) No results for input(s): PROBNP in the last 8760 hours. HbA1C: No results for input(s): HGBA1C in the last 72 hours. CBG: Recent Labs  Lab 05/24/2019 1525 05/30/19 0011  GLUCAP 225* 128*   Lipid Profile: No results for input(s): CHOL, HDL, LDLCALC, TRIG, CHOLHDL, LDLDIRECT in the last 72 hours. Thyroid Function Tests: No results for input(s): TSH, T4TOTAL, FREET4, T3FREE, THYROIDAB in the last 72 hours. Anemia Panel: Recent Labs    05/31/19 0601  VITAMINB12 300   Sepsis Labs: Recent Labs  Lab 05/19/2019 1531 05/13/2019 1727 05/16/2019 2300  LATICACIDVEN 4.1* 3.1* 2.3*    Recent Results (from the past 240 hour(s))  Urine culture     Status: None   Collection Time: 06/04/2019  3:27 PM   Specimen: Urine, Clean Catch  Result Value Ref Range Status   Specimen Description   Final    URINE, CLEAN CATCH Performed at [facility], [place] [address] [place], [place] [address]    Special Requests   Final    NONE Performed at [facility], [place] [address] [place], [place] [address]    Culture   Final    NO GROWTH Performed at [facility], [place] [address], [place], [place] [address]    Report Status  05/30/2019 FINAL  Final  SARS Coronavirus 2 by RT PCR (hospital order, performed in [facility] hospital lab) Nasopharyngeal Nasopharyngeal Swab     Status: None   Collection Time: 05/22/2019  4:12 PM   Specimen: Nasopharyngeal Swab  Result Value Ref Range Status   SARS Coronavirus 2 NEGATIVE NEGATIVE Final    Comment: (NOTE) SARS-CoV-2 target nucleic acids are NOT DETECTED. The SARS-CoV-2 RNA is generally detectable in upper and lower respiratory specimens during the acute phase of infection. The lowest concentration of SARS-CoV-2 viral copies this assay can detect is 250 copies / mL. A negative result does not preclude SARS-CoV-2 infection and should not be used as the sole basis for treatment or other patient management decisions.  A negative result may occur with improper specimen collection / handling, submission of specimen other than nasopharyngeal swab, presence of viral mutation(s) within the areas targeted by this assay, and inadequate number of viral  copies (<250 copies / mL). A negative result must be combined with clinical observations, patient history, and epidemiological information. Fact Sheet for Patients:   StrictlyIdeas.no Fact Sheet for Healthcare Providers: BankingDealers.co.za This test is not yet approved or cleared  by the [place] FDA and has been authorized for detection and/or diagnosis of SARS-CoV-2 by FDA under an Emergency Use Authorization (EUA).  This EUA will remain in effect (meaning this test can be used) for the duration of the COVID-19 declaration under Section 564(b)(1) of the Act, 21 U.S.C. section 360bbb-3(b)(1), unless the authorization is terminated or revoked sooner. Performed at [facility], [place] [address] [place], [place] [address]   Culture, blood (routine x 2)     Status: None (Preliminary result)   Collection Time: 05/12/2019  4:18 PM   Specimen: BLOOD  Result Value  Ref Range Status   Specimen Description   Final    BLOOD SITE NOT SPECIFIED Performed at [facility] [address] [place], [place] [address]    Special Requests   Final    BOTTLES DRAWN AEROBIC AND ANAEROBIC Blood Culture adequate volume Performed at [place] [address] [place], [place] [address]    Culture   Final    NO GROWTH 3 DAYS Performed at [facility], [place] [address] [place], [place] [address]    Report Status PENDING  Incomplete  MRSA PCR Screening     Status: None   Collection Time: 05/09/2019  6:25 PM   Specimen: Nasal Mucosa; Nasopharyngeal  Result Value Ref Range Status   MRSA by PCR NEGATIVE NEGATIVE Final    Comment:        The GeneXpert MRSA Assay (FDA approved for NASAL specimens only), is one component of a comprehensive MRSA colonization surveillance program. It is not intended to diagnose MRSA infection nor to guide or monitor treatment for MRSA infections. Performed at [facility], [place] [address] [place], [place] [address]   Culture, blood (routine x 2)     Status: None (Preliminary result)   Collection Time: 05/27/2019  6:47 PM   Specimen: BLOOD  Result Value Ref Range Status   Specimen Description   Final    BLOOD RIGHT ANTECUBITAL Performed at [facility] [address] [place], [place] [address]    Special Requests   Final    BOTTLES DRAWN AEROBIC AND ANAEROBIC Blood Culture adequate volume Performed at [place] [address] [place], [place] [address]    Culture   Final    NO GROWTH 2 DAYS Performed at [facility] [address], [place], [place] [address]    Report Status PENDING  Incomplete  C Difficile Quick Screen w PCR reflex     Status: None   Collection Time: 05/30/19 10:17 PM   Specimen: STOOL  Result Value Ref Range Status   C Diff antigen NEGATIVE NEGATIVE Final   C Diff toxin NEGATIVE NEGATIVE  Final   C Diff interpretation No C. difficile detected.  Final    Comment: Performed at [facility], [place] [address] [place], [place] [address]      Radiology Studies: MR BRAIN WO CONTRAST  Result Date: 05/31/2019 CLINICAL DATA:  Encephalopathy EXAM: MRI HEAD WITHOUT CONTRAST TECHNIQUE: Multiplanar, multiecho pulse sequences of the brain and surrounding structures were obtained without intravenous contrast. COMPARISON:  None. FINDINGS: Brain: There is no acute infarction or intracranial hemorrhage. There  is no intracranial mass, mass effect, or edema. There is no hydrocephalus or extra-axial fluid collection. Prominence of the ventricles and sulci reflects generalized parenchymal volume loss. Patchy T2 hyperintensity in the supratentorial and pontine white matter is nonspecific but may reflect mild chronic microvascular ischemic changes. Vascular: Major vessel flow voids at the skull base are preserved. Skull and upper cervical spine: Normal marrow signal is preserved. Sinuses/Orbits: Paranasal sinuses are aerated. No significant orbital finding. Other: Sella is unremarkable. Mild patchy right mastoid opacification. IMPRESSION: No evidence of recent infarction, hemorrhage, or mass. Chronic microvascular ischemic changes. Electronically Signed   By: Macy Mis M.D.   On: 05/31/2019 11:03   EEG adult  Result Date: 05/31/2019 Lora Havens, MD     05/31/2019 11:11 AM Patient Name: CARTRELL BENTSEN MRN: [id number] Epilepsy Attending: Lora Havens Referring Physician/Provider: Dr. Antonieta Pert Date: 05/31/2019 Duration: 22.33 minutes Patient history: 74 year old male with altered mental status.  EEG tolerated for seizures. Level of alertness: Lethargic, sleep AEDs during EEG study: None Technical aspects: This EEG study was done with scalp electrodes positioned according to the 10-20 International system of electrode placement. Electrical activity was acquired at a sampling rate of  '500Hz'  and reviewed with a high frequency filter of '70Hz'  and a low frequency filter of '1Hz' . EEG data were recorded continuously and digitally stored. Description: No clear posterior dominant rhythm was seen.  Sleep was characterized by vertex waves, sleep spindles (12 to 14 Hz), maximal frontocentral region.  EEG showed continuous generalized 3 to 6 Hz theta-delta slowing. Hyperventilation and photic stimulation were not performed.   ABNORMALITY -Continuous slow, generalized IMPRESSION: This study is suggestive of moderate to severe diffuse encephalopathy, nonspecific etiology. No seizures or epileptiform discharges were seen throughout the recording. Lora Havens     LOS: 4 days   Antonieta Pert, MD [facility]  06/01/2019, 11:06 AM

## 2019-06-01 NOTE — Progress Notes (Signed)
In and out cath using sterile tech.  Tolerated well.  350 ml urine received.

## 2019-06-02 ENCOUNTER — Inpatient Hospital Stay (HOSPITAL_COMMUNITY): Payer: Medicare Other

## 2019-06-02 ENCOUNTER — Other Ambulatory Visit (HOSPITAL_COMMUNITY): Payer: Medicare Other

## 2019-06-02 DIAGNOSIS — E876 Hypokalemia: Secondary | ICD-10-CM

## 2019-06-02 DIAGNOSIS — Z7189 Other specified counseling: Secondary | ICD-10-CM

## 2019-06-02 DIAGNOSIS — R9431 Abnormal electrocardiogram [ECG] [EKG]: Secondary | ICD-10-CM

## 2019-06-02 DIAGNOSIS — Z515 Encounter for palliative care: Secondary | ICD-10-CM

## 2019-06-02 DIAGNOSIS — I248 Other forms of acute ischemic heart disease: Secondary | ICD-10-CM

## 2019-06-02 DIAGNOSIS — E860 Dehydration: Secondary | ICD-10-CM

## 2019-06-02 DIAGNOSIS — R531 Weakness: Secondary | ICD-10-CM

## 2019-06-02 DIAGNOSIS — R4182 Altered mental status, unspecified: Secondary | ICD-10-CM

## 2019-06-02 LAB — URINALYSIS, ROUTINE W REFLEX MICROSCOPIC
Bilirubin Urine: NEGATIVE
Glucose, UA: 50 mg/dL — AB
Ketones, ur: NEGATIVE mg/dL
Leukocytes,Ua: NEGATIVE
Nitrite: NEGATIVE
Protein, ur: NEGATIVE mg/dL
RBC / HPF: >50 RBC/hpf — ABNORMAL HIGH (ref 0–5)
Specific Gravity, Urine: 1.008 (ref 1.005–1.030)
pH: 5 (ref 5.0–8.0)

## 2019-06-02 LAB — PROTEIN / CREATININE RATIO, URINE
Creatinine, Urine: 41.9 mg/dL
Protein Creatinine Ratio: 0.74 mg/mg{Cre} — ABNORMAL HIGH (ref 0.00–0.15)
Total Protein, Urine: 31 mg/dL

## 2019-06-02 LAB — ECHOCARDIOGRAM COMPLETE
Height: 71 in
Weight: 1982.38 oz

## 2019-06-02 LAB — COMPREHENSIVE METABOLIC PANEL
ALT: 48 U/L — ABNORMAL HIGH (ref 0–44)
AST: 41 U/L (ref 15–41)
Albumin: 2.6 g/dL — ABNORMAL LOW (ref 3.5–5.0)
Alkaline Phosphatase: 60 U/L (ref 38–126)
Anion gap: 7 (ref 5–15)
BUN: 32 mg/dL — ABNORMAL HIGH (ref 8–23)
CO2: 15 mmol/L — ABNORMAL LOW (ref 22–32)
Calcium: 8.8 mg/dL — ABNORMAL LOW (ref 8.9–10.3)
Chloride: 111 mmol/L (ref 98–111)
Creatinine, Ser: 1.99 mg/dL — ABNORMAL HIGH (ref 0.61–1.24)
GFR calc Af Amer: 37 mL/min — ABNORMAL LOW (ref >60)
GFR calc non Af Amer: 32 mL/min — ABNORMAL LOW (ref >60)
Glucose, Bld: 84 mg/dL (ref 70–99)
Potassium: 5.8 mmol/L — ABNORMAL HIGH (ref 3.5–5.1)
Sodium: 133 mmol/L — ABNORMAL LOW (ref 135–145)
Total Bilirubin: 0.8 mg/dL (ref 0.3–1.2)
Total Protein: 5 g/dL — ABNORMAL LOW (ref 6.5–8.1)

## 2019-06-02 LAB — BASIC METABOLIC PANEL
Anion gap: 8 (ref 5–15)
BUN: 33 mg/dL — ABNORMAL HIGH (ref 8–23)
CO2: 16 mmol/L — ABNORMAL LOW (ref 22–32)
Calcium: 9 mg/dL (ref 8.9–10.3)
Chloride: 110 mmol/L (ref 98–111)
Creatinine, Ser: 2.01 mg/dL — ABNORMAL HIGH (ref 0.61–1.24)
GFR calc Af Amer: 36 mL/min — ABNORMAL LOW (ref >60)
GFR calc non Af Amer: 31 mL/min — ABNORMAL LOW (ref >60)
Glucose, Bld: 72 mg/dL (ref 70–99)
Potassium: 4.5 mmol/L (ref 3.5–5.1)
Sodium: 134 mmol/L — ABNORMAL LOW (ref 135–145)

## 2019-06-02 LAB — CBC
HCT: 22.1 % — ABNORMAL LOW (ref 39.0–52.0)
Hemoglobin: 7.4 g/dL — ABNORMAL LOW (ref 13.0–17.0)
MCH: 29.7 pg (ref 26.0–34.0)
MCHC: 33.5 g/dL (ref 30.0–36.0)
MCV: 88.8 fL (ref 80.0–100.0)
Platelets: 12 10*3/uL — CL (ref 150–400)
RBC: 2.49 MIL/uL — ABNORMAL LOW (ref 4.22–5.81)
RDW: 23.4 % — ABNORMAL HIGH (ref 11.5–15.5)
WBC: 5.2 10*3/uL (ref 4.0–10.5)
nRBC: 0.6 % — ABNORMAL HIGH (ref 0.0–0.2)

## 2019-06-02 LAB — GLUCOSE, CAPILLARY
Glucose-Capillary: 149 mg/dL — ABNORMAL HIGH (ref 70–99)
Glucose-Capillary: 81 mg/dL (ref 70–99)
Glucose-Capillary: 80 mg/dL (ref 70–99)

## 2019-06-02 LAB — TROPONIN I (HIGH SENSITIVITY)
Troponin I (High Sensitivity): 702 ng/L (ref <18)
Troponin I (High Sensitivity): 776 ng/L (ref <18)

## 2019-06-02 LAB — CK: Total CK: 117 U/L (ref 49–397)

## 2019-06-02 LAB — MAGNESIUM: Magnesium: 1.8 mg/dL (ref 1.7–2.4)

## 2019-06-02 LAB — CULTURE, BLOOD (ROUTINE X 2)
Culture: NO GROWTH
Special Requests: ADEQUATE

## 2019-06-02 MED ORDER — MORPHINE SULFATE (PF) 2 MG/ML IV SOLN: 1.0000 mg | INTRAVENOUS | Status: DC | PRN

## 2019-06-02 MED ORDER — SODIUM CHLORIDE 0.9 % IV SOLN: 250.0000 mL | INTRAVENOUS | Status: DC | PRN

## 2019-06-02 MED ORDER — POLYVINYL ALCOHOL 1.4 % OP SOLN
1.0000 [drp] | Freq: Four times a day (QID) | OPHTHALMIC | Status: DC | PRN
  Filled 2019-06-02: qty 15

## 2019-06-02 MED ORDER — GLYCOPYRROLATE 0.2 MG/ML IJ SOLN
0.2000 mg | INTRAMUSCULAR | Status: DC | PRN
  Filled 2019-06-02: qty 1

## 2019-06-02 MED ORDER — DEXTROSE 50 % IV SOLN
12.5000 g | Freq: Once | INTRAVENOUS | Status: AC
  Administered 2019-06-02: 12.5 g via INTRAVENOUS

## 2019-06-02 MED ORDER — SODIUM CHLORIDE 0.9% FLUSH
3.0000 mL | Freq: Two times a day (BID) | INTRAVENOUS | Status: DC
  Administered 2019-06-03 (×2): 3 mL via INTRAVENOUS

## 2019-06-02 MED ORDER — GLYCOPYRROLATE 0.2 MG/ML IJ SOLN
0.2000 mg | INTRAMUSCULAR | Status: DC | PRN
  Administered 2019-06-04: 0.2 mg via INTRAVENOUS
  Filled 2019-06-02 (×3): qty 1

## 2019-06-02 MED ORDER — STERILE WATER FOR INJECTION IV SOLN
INTRAVENOUS | Status: DC
  Filled 2019-06-02: qty 850

## 2019-06-02 MED ORDER — SODIUM BICARBONATE 8.4 % IV SOLN
INTRAVENOUS | Status: DC
  Filled 2019-06-02: qty 150

## 2019-06-02 MED ORDER — SODIUM CHLORIDE 0.9% FLUSH
3.0000 mL | INTRAVENOUS | Status: DC | PRN
  Administered 2019-06-03: 3 mL via INTRAVENOUS

## 2019-06-02 MED ORDER — CALCIUM GLUCONATE-NACL 1-0.675 GM/50ML-% IV SOLN
1.0000 g | Freq: Once | INTRAVENOUS | Status: AC
  Administered 2019-06-02: 1000 mg via INTRAVENOUS
  Filled 2019-06-02: qty 50

## 2019-06-02 MED ORDER — SODIUM BICARBONATE-DEXTROSE 150-5 MEQ/L-% IV SOLN
150.0000 meq | INTRAVENOUS | Status: DC
  Administered 2019-06-02 – 2019-06-05 (×2): 150 meq via INTRAVENOUS
  Filled 2019-06-02 (×5): qty 1000

## 2019-06-02 MED ORDER — DEXTROSE 50 % IV SOLN
INTRAVENOUS | Status: AC
  Filled 2019-06-02: qty 50

## 2019-06-02 MED ORDER — GLYCOPYRROLATE 1 MG PO TABS
1.0000 mg | ORAL_TABLET | ORAL | Status: DC | PRN
  Filled 2019-06-02: qty 1

## 2019-06-02 MED ORDER — SODIUM CHLORIDE 0.45 % IV SOLN: INTRAVENOUS | Status: DC

## 2019-06-02 MED ORDER — BIOTENE DRY MOUTH MT LIQD: 15.0000 mL | OROMUCOSAL | Status: DC | PRN

## 2019-06-02 NOTE — Consult Note (Signed)
Cardiology Consultation:   Calvin ID: Calvin Mccormick MRN: [id number]; DOB: 02/28/1945  Admit date: 06/02/2019 Date of Consult: 06/02/2019  Primary Care Provider: Lucianne Lei, MD Primary Cardiologist: No primary care provider on file. new Primary Electrophysiologist:  None    Calvin Profile:   Calvin Mccormick is a 74 y.o. male with a hx of relapsing multiple myeloma currently on chemotherapy (dexamethasone andSenilexor), CKD 3, chronic pancytopenia, chronic diarrhea, hypertension, and hyperlipidemia who is being seen today for the evaluation of elevated troponin at the request of Dr. Maren Beach.  History of Present Illness:   Calvin Mccormick was admitted 5/21 in the setting of being found unresponsive.  Per his family he developed nausea and vomiting after taking stomach pills and then became less responsive.  In the ED he was minimally responsive and vital signs were stable.  He had acute on chronic renal failure and was found to be intravascularly volume depleted thought to be attributable to nausea and vomiting and chronic diarrhea.  Lactate was elevated to 4.1.  Thyroid function was within normal limits.  Troponins were not initially assessed, but were checked today and were found to be elevated at 702.  Troponin was checked because he has some hyperkalemia and an EKG was obtained that showed ST and T changes.  Cardiology was consulted due to these findings.  By report, he has not had any chest pain and his breathing has been stable.  Unable to obtain history from Calvin as he is unarousable.   Past Medical History:  Diagnosis Date  . Anemia 11/27/2010  . Cancer ([facility])   . Chronic renal insufficiency, stage III (moderate) 06/17/2011   Due to myeloma & HTN  . CKD (chronic kidney disease), stage III 02/05/2011  . 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 Diarrhea 06/17/2011   Hospital admission 05/30/11 velcade toxicity? Infectious?  Marland Kitchen Hyperlipemia   . Hypertension, benign essential, goal below 140/90 11/27/2010   . Hypokalemia with normal acid-base balance 07/29/2011  . Hypomagnesemia 07/29/2011  . Seasonal allergies     Past Surgical History:  Procedure Laterality Date  . COLONOSCOPY  07/26/2011   Procedure: COLONOSCOPY;  Surgeon: Cleotis Nipper, MD;  Location: [facility];  Service: Endoscopy;  Laterality: N/A;  . ESOPHAGOGASTRODUODENOSCOPY (EGD) WITH PROPOFOL Left 10/17/2016   Procedure: ESOPHAGOGASTRODUODENOSCOPY (EGD) WITH PROPOFOL;  Surgeon: Ronnette Juniper, MD;  Location: [facility];  Service: Gastroenterology;  Laterality: Left;  . EYE SURGERY     bilateral cataract  surgery  . MULTIPLE EXTRACTIONS WITH ALVEOLOPLASTY N/A 05/27/2014   Procedure: Extraction of tooth #'s 1,6,7,8,10,11,13,21,22,23,24,25,26,27 and 28 with alveoloplasty;  Surgeon: Lenn Cal, DDS;  Location: [facility];  Service: Oral Surgery;  Laterality: N/A;  . porta cath      for chemotherapy- right chest area     Home Medications:  Prior to Admission medications   Medication Sig Start Date End Date Taking? Authorizing Provider  acyclovir (ZOVIRAX) 400 MG tablet Take 1 tablet (400 mg total) by mouth daily. 03/01/19  Yes Gorsuch, Ni, MD  allopurinol (ZYLOPRIM) 100 MG tablet Take 100 mg by mouth 2 (two) times daily.  12/06/12  Yes [provider]  amLODipine (NORVASC) 10 MG tablet Take 10 mg by mouth daily. 07/19/16  Yes [provider]  aspirin EC 81 MG tablet Take 81 mg by mouth daily.   Yes [provider]  atorvastatin (LIPITOR) 20 MG tablet Take 20 mg by mouth at bedtime.    Yes [provider]  brimonidine (ALPHAGAN) 0.2 %  ophthalmic solution Place 1 drop into the right eye 3 (three) times daily.  02/02/18  Yes [provider]  calcium carbonate (TUMS - DOSED IN MG ELEMENTAL CALCIUM) 500 MG chewable tablet Chew 1 tablet by mouth daily as needed for indigestion or heartburn.   Yes [provider]  calcium-vitamin D (OSCAL WITH D) 500-200 MG-UNIT tablet Take 2 tablets  by mouth 3 (three) times daily. 04/22/19  Yes Eugenie Filler, MD  dexamethasone (DECADRON) 4 MG tablet Take 4 tabs on days 1 and 3 every week, take with food, preferably in the morning 05/18/19  Yes Gorsuch, Ni, MD  dorzolamide-timolol (COSOPT) 22.3-6.8 MG/ML ophthalmic solution Place 1 drop into the right eye 2 (two) times daily.  07/29/13  Yes [provider]  EUTHYROX 75 MCG tablet Take 75 mcg by mouth daily before breakfast. 01/16/19  Yes [provider]  latanoprost (XALATAN) 0.005 % ophthalmic solution Place 1 drop into the right eye at bedtime.   Yes [provider]  ondansetron (ZOFRAN) 8 MG tablet Take 1 tablet (8 mg total) by mouth every 8 (eight) hours as needed (Nausea or vomiting). 03/01/19  Yes Gorsuch, Ni, MD  pantoprazole (PROTONIX) 40 MG tablet TAKE 1 TABLET BY MOUTH EVERY DAY 03/08/19  Yes Alvy Bimler, Ernst Spell, MD  selinexor 20 MG TBPK Take 60 mg twice weekly on Tuesdays and Thursdays 05/25/19  Yes Gorsuch, Ni, MD  sucralfate (CARAFATE) 1 GM/10ML suspension  05/11/19  Yes [provider]  fluorometholone (FML) 0.1 % ophthalmic suspension Place 1 drop into the right eye daily.     [provider]  lidocaine-prilocaine (EMLA) cream Apply to affected area once 06/08/18   Heath Lark, MD  magnesium oxide (MAG-OX) 400 (241.3 Mg) MG tablet Take 1 tablet (400 mg total) by mouth daily. 04/27/19   Heath Lark, MD  prochlorperazine (COMPAZINE) 10 MG tablet Take 1 tablet (10 mg total) by mouth every 6 (six) hours as needed for nausea or vomiting. 03/01/19   Heath Lark, MD    Inpatient Medications: Scheduled Meds: . allopurinol  100 mg Oral BID  . brimonidine  1 drop Right Eye TID  . Chlorhexidine Gluconate Cloth  6 each Topical Daily  . dorzolamide-timolol  1 drop Right Eye BID  . fluorometholone  1 drop Right Eye Daily  . latanoprost  1 drop Right Eye QHS  . levothyroxine  37.5 mcg Intravenous Daily  . loperamide  2 mg Oral TID  . mouth rinse  15 mL Mouth  Rinse BID  . sodium chloride flush  10-40 mL Intracatheter Q12H   Continuous Infusions: . famotidine (PEPCID) IV 20 mg (06/02/19 1048)  .  sodium bicarbonate (isotonic) infusion in sterile water 75 mL/hr at 06/02/19 1157   PRN Meds: acetaminophen **OR** acetaminophen, albuterol, ondansetron **OR** ondansetron (ZOFRAN) IV, sodium chloride flush  Allergies:   No Known Allergies  Social History:   Social History   Socioeconomic History  . Marital status: Married    Spouse name: Not on file  . Number of children: Not on file  . Years of education: Not on file  . Highest education level: Not on file  Occupational History  . Not on file  Tobacco Use  . Smoking status: Never Smoker  . Smokeless tobacco: Never Used  Substance and Sexual Activity  . Alcohol use: No  . Drug use: No  . Sexual activity: Not Currently  Other Topics Concern  . Not on file  Social History Narrative  . Not  on file   Social Determinants of Health   Financial Resource Strain:   . Difficulty of Paying Living Expenses:   Food Insecurity:   . Worried About Charity fundraiser in the Last Year:   . Arboriculturist in the Last Year:   Transportation Needs:   . Film/video editor (Medical):   Marland Kitchen Lack of Transportation (Non-Medical):   Physical Activity:   . Days of Exercise per Week:   . Minutes of Exercise per Session:   Stress:   . Feeling of Stress :   Social Connections:   . Frequency of Communication with Friends and Family:   . Frequency of Social Gatherings with Friends and Family:   . Attends Religious Services:   . Active Member of Clubs or Organizations:   . Attends Archivist Meetings:   Marland Kitchen Marital Status:   Intimate Partner Violence:   . Fear of Current or Ex-Partner:   . Emotionally Abused:   Marland Kitchen Physically Abused:   . Sexually Abused:     Family History:    Family History  Problem Relation Age of Onset  . Cancer Brother        lung ca     ROS:  Please see the  history of present illness.   All other ROS reviewed and negative.     Physical Exam/Data:   Vitals:   06/02/19 1000 06/02/19 1100 06/02/19 1200 06/02/19 1300  BP: (!) 105/58 122/60 95/66 (!) 113/52  Pulse: (!) 57 69 66 68  Resp: _0 Temp:  (!) 97.3 F (36.3 C)    TempSrc:  Axillary    SpO2: 100% 100% 93% 100%  Weight:      Height:        Intake/Output Summary (Last 24 hours) at 06/02/2019 1304 Last data filed at 06/02/2019 1000 Gross per 24 hour  Intake 3094.1 ml  Output 75 ml  Net 3019.1 ml   Last 3 Weights 05/18/2019 05/25/2019 05/18/2019  Weight (lbs) 123 lb 14.4 oz 131 lb 14.4 oz 131 lb 6.4 oz  Weight (kg) 56.2 kg 59.829 kg 59.603 kg     VS:  BP (!) 113/52 (BP Location: Right Arm)   Pulse 68   Temp (!) 97.3 F (36.3 C) (Axillary)   Resp 11   Ht 5' 11" (1.803 m)   Wt 56.2 kg   SpO2 100%   BMI 17.28 kg/m  , BMI Body mass index is 17.28 kg/m. GENERAL:  Chronically ill appearing and unarousable.  Breathing unlabored. HEENT: Pupils equal round and reactive, fundi not visualized, oral mucosa dry NECK:  No JVD LUNGS:  Clear to auscultation bilaterally on anterior exam HEART:  RRR.  PMI not displaced or sustained,S1 and S2 within normal limits, no S3, no S4, no clicks, no rubs, no murmurs ABD:  Flat, positive bowel sounds normal in frequency in pitch, no bruits, no rebound, no guarding, no midline pulsatile mass, no hepatomegaly, no splenomegaly EXT:  2 plus pulses throughout, no edema, no cyanosis no clubbing SKIN:  No rashes no nodules NEURO:  Unable to assess.  Calvin unarousable PSYCH:  Unable to assess.  Calvin unarousable   EKG:  The EKG was personally reviewed and demonstrates:  Sinus rhythm.  Rate 77 bpm.  Diffuse, deep T wave inversions.  QTc 459 ms. Telemetry:  Telemetry was personally reviewed and demonstrates: Sinus rhythm.  No events.  Relevant CV Studies:  Echo 06/2011: Study Conclusions   -  Left ventricle: The cavity size was normal.  Systolic  function was normal. The estimated ejection fraction was  in the range of 55% to 60%. There was dynamic obstruction  in the outflow tract, with a peak velocity of 28cm/sec and  a peak gradient of 54m Hg. Wall motion was normal; there  were no regional wall motion abnormalities. There was an  increased relative contribution of atrial contraction to  ventricular filling. Doppler parameters are consistent  with abnormal left ventricular relaxation (grade 1  diastolic dysfunction).  - Pulmonary arteries: PA peak pressure: 337mHg (S).   Laboratory Data:  High Sensitivity Troponin:   Recent Labs  Lab 06/02/19 0436 06/02/19 0739  TROPONINIHS 702* 776*     Chemistry Recent Labs  Lab 06/01/19 0251 06/02/19 0246 06/02/19 0739  NA 137 133* 134*  K 4.2 5.8* 4.5  CL 112* 111 110  CO2 16* 15* 16*  GLUCOSE 100* 84 72  BUN 20 32* 33*  CREATININE 1.40* 1.99* 2.01*  CALCIUM 8.8* 8.8* 9.0  GFRNONAA 48* 32* 31*  GFRAA 56* 37* 36*  ANIONGAP _0 Recent Labs  Lab 05/31/19 0601 06/01/19 0251 06/02/19 0246  PROT 5.1* 6.0* 5.0*  ALBUMIN 2.4* 2.9* 2.6*  AST 18 18 41  ALT 28 27 48*  ALKPHOS 55 63 60  BILITOT 0.7 0.8 0.8   Hematology Recent Labs  Lab 05/31/19 0601 06/01/19 0251 06/02/19 0246  WBC 6.7 7.5 5.2  RBC 2.52* 3.06* 2.49*  HGB 7.5* 9.1* 7.4*  HCT 22.6* 27.4* 22.1*  MCV 89.7 89.5 88.8  MCH 29.8 29.7 29.7  MCHC 33.2 33.2 33.5  RDW 23.5* 23.1* 23.4*  PLT 19* 18* 12*   BNPNo results for input(s): BNP, PROBNP in the last 168 hours.  DDimer No results for input(s): DDIMER in the last 168 hours.   Radiology/Studies:  MR BRAIN WO CONTRAST  Result Date: 05/31/2019 CLINICAL DATA:  Encephalopathy EXAM: MRI HEAD WITHOUT CONTRAST TECHNIQUE: Multiplanar, multiecho pulse sequences of the brain and surrounding structures were obtained without intravenous contrast. COMPARISON:  None. FINDINGS: Brain: There is no acute infarction or intracranial  hemorrhage. There is no intracranial mass, mass effect, or edema. There is no hydrocephalus or extra-axial fluid collection. Prominence of the ventricles and sulci reflects generalized parenchymal volume loss. Patchy T2 hyperintensity in the supratentorial and pontine white matter is nonspecific but may reflect mild chronic microvascular ischemic changes. Vascular: Major vessel flow voids at the skull base are preserved. Skull and upper cervical spine: Normal marrow signal is preserved. Sinuses/Orbits: Paranasal sinuses are aerated. No significant orbital finding. Other: Sella is unremarkable. Mild patchy right mastoid opacification. IMPRESSION: No evidence of recent infarction, hemorrhage, or mass. Chronic microvascular ischemic changes. Electronically Signed   By: PrMacy Mis.D.   On: 05/31/2019 11:03   [place]  Result Date: 06/02/2019 CLINICAL DATA:  Acute kidney injury. EXAM: RENAL / URINARY TRACT ULTRASOUND COMPLETE COMPARISON:  CT abdomen pelvis 12/06/2016 FINDINGS: Right Kidney: Renal measurements: 8.6 x 4.3 x 3.7 cm = volume: 71 mL. Increased echogenicity of the renal cortex. No mass or hydronephrosis. Left Kidney: Renal measurements: 10.2 x 5.1 x 4.3 cm = volume: 116 mL. Increased cortical echogenicity. No obstruction. 4 cm left lower pole renal cyst. Bladder: Appears normal for degree of bladder distention. Bladder volume 91 cc Other: None. IMPRESSION: Negative for renal obstruction. Relatively small kidneys with increased cortical echogenicity suggesting chronic kidney disease. Electronically Signed   By: ChFranchot Gallo.D.  On: 06/02/2019 08:04   EEG adult  Result Date: 05/31/2019 Lora Havens, MD     05/31/2019 11:11 AM Calvin Mccormick MRN: [id number] Epilepsy Attending: Lora Havens Referring Physician/Provider: Dr. Antonieta Pert Date: 05/31/2019 Duration: 22.33 minutes Calvin history: 74 year old male with altered mental status.  EEG tolerated for seizures. Level of  alertness: Lethargic, sleep AEDs during EEG study: None Technical aspects: This EEG study was done with scalp electrodes positioned according to the 10-20 International system of electrode placement. Electrical activity was acquired at a sampling rate of 500Hz and reviewed with a high frequency filter of 70Hz and a low frequency filter of 1Hz. EEG data were recorded continuously and digitally stored. Description: No clear posterior dominant rhythm was seen.  Sleep was characterized by vertex waves, sleep spindles (12 to 14 Hz), maximal frontocentral region.  EEG showed continuous generalized 3 to 6 Hz theta-delta slowing. Hyperventilation and photic stimulation were not performed.   ABNORMALITY -Continuous slow, generalized IMPRESSION: This study is suggestive of moderate to severe diffuse encephalopathy, nonspecific etiology. No seizures or epileptiform discharges were seen throughout the recording. Lora Havens   {  Assessment and Plan:  # EKG changes: # Elevated troponin: Calvin Mccormick EKG shows diffuse, deep T wave inversions.  This is much more likely to be due to a global issue such as stroke or metabolic derangements.  There is no stroke seen on MRI 05/31/2019.  He has had both hyper and hypokalemia in the last 24 hours.  His troponin is elevated but flat.  He is not a candidate for any cardiac interventions in his current state.  He is encephalopathic and unable to answer any questions, but he seems to be resting comfortably in his room.  Given his pancytopenia he is not a candidate for cath.  We will get an echocardiogram, though there is no evidence of any heart failure on exam.  His initial presentation was due to intravascular volume depletion.  His blood pressure has been quite labile and low at times.  Would not start any antihypertensives at this time.  Consider maintaining hgb >8.  Given his pancytopenia, would not start heparin.      For questions or updates, please contact [place] Please consult [email or web address] for contact info under     Signed, Skeet Latch, MD  06/02/2019 1:04 PM

## 2019-06-02 NOTE — Progress Notes (Addendum)
MD on call notified of raise in potassium, BUN, and creatinine. Fluids changed to 0.45% NS and 0.45% NS with 40 K discontinued. EKG obtained as directed by MD. MD paged and notified of results and that they are available in [place]. Patient resting comfortably, denies complaints.

## 2019-06-02 NOTE — Consult Note (Signed)
[facility] Renal Consultation Note  Requesting MD:  Indication for Consultation: Acute kidney injury, hyperkalemia, metabolic acidosis HPI:   Calvin Mccormick is a 74 y.o. male.  With a history of multiple myeloma currently in relapse.  History of pancytopenia acute metabolic encephalopathy.  Has baseline serum creatinine 1.5 mg/dL.  Patient was admitted 05/14/2019.  He is followed by Dr. Alvy Bimler and is being treated aggressively with chemotherapy for relapsing multiple myeloma.  Patient has been receiving [name] 60 mg every Tuesday and Thursday.  Has been seen by palliative medicine and appreciate the assistance of Dr. Rowe Pavy recommending home palliative medicine.  Blood pressure 118/60 pulse 61 temperature 97.3 O2 sats 100% room air IV fluids half-normal saline 75 cc an hour  Urine output 700 cc 06/01/2019 appears to be diminishing.  No urine output recorded this morning appears to be developing for positive fluid balance of about 4 L since admission.  Admission weight was 56.2 kg  Labs sodium 134 potassium 4.5 chloride 110 CO2 16 BUN 33 creatinine 2 glucose 72 CK 117 hemoglobin 7.4 Urinalysis showed 0-5 WBCs 0-5 RBCs Renal ultrasound was negative for obstruction relatively small size kidneys with increasing cortical echogenicity suggestive of chronic kidney disease 06/02/2018   Dextrose D50 was initiated 06/01/2019 for hypoglycemia IV calcium gluconate given 1 g 06/02/2019  Levothyroxine 37.5 mg IV daily  Creatinine  Date/Time Value Ref Range Status  05/11/2019 10:25 AM 2.26 (H) 0.61 - 1.24 mg/dL Final  07/06/2018 09:35 AM 1.85 (H) 0.61 - 1.24 mg/dL Final  01/09/2017 09:13 AM 1.5 (H) 0.7 - 1.3 mg/dL Final  12/26/2016 08:34 AM 1.5 (H) 0.7 - 1.3 mg/dL Final  12/12/2016 08:22 AM 2.4 (H) 0.7 - 1.3 mg/dL Final  11/13/2016 08:36 AM 1.9 (H) 0.7 - 1.3 mg/dL Final  10/16/2016 07:48 AM 1.7 (H) 0.7 - 1.3 mg/dL Final  09/25/2016 08:08 AM 1.8 (H) 0.7 - 1.3 mg/dL Final  09/04/2016  07:46 AM 1.7 (H) 0.7 - 1.3 mg/dL Final  08/21/2016 09:48 AM 1.7 (H) 0.7 - 1.3 mg/dL Final  08/14/2016 09:17 AM 1.7 (H) 0.7 - 1.3 mg/dL Final  07/24/2016 08:06 AM 1.5 (H) 0.7 - 1.3 mg/dL Final  07/03/2016 07:58 AM 1.8 (H) 0.7 - 1.3 mg/dL Final  06/12/2016 08:00 AM 1.8 (H) 0.7 - 1.3 mg/dL Final  05/22/2016 08:00 AM 1.6 (H) 0.7 - 1.3 mg/dL Final  05/01/2016 07:36 AM 1.8 (H) 0.7 - 1.3 mg/dL Final  04/24/2016 07:37 AM 1.7 (H) 0.7 - 1.3 mg/dL Final  04/17/2016 07:42 AM 1.8 (H) 0.7 - 1.3 mg/dL Final  04/10/2016 08:06 AM 1.9 (H) 0.7 - 1.3 mg/dL Final  04/03/2016 08:52 AM 2.0 (H) 0.7 - 1.3 mg/dL Final  03/27/2016 07:46 AM 2.3 (H) 0.7 - 1.3 mg/dL Final  03/20/2016 07:39 AM 2.3 (H) 0.7 - 1.3 mg/dL Final  03/13/2016 07:45 AM 2.3 (H) 0.7 - 1.3 mg/dL Final  03/06/2016 07:40 AM 2.8 (H) 0.7 - 1.3 mg/dL Final  02/22/2016 12:33 PM 2.1 (H) 0.7 - 1.3 mg/dL Final  02/15/2016 09:51 AM 1.9 (H) 0.7 - 1.3 mg/dL Final  11/16/2015 12:15 PM 1.9 (H) 0.7 - 1.3 mg/dL Final  08/15/2015 11:13 AM 1.9 (H) 0.7 - 1.3 mg/dL Final  05/16/2015 10:46 AM 1.8 (H) 0.7 - 1.3 mg/dL Final  02/09/2015 10:58 AM 1.9 (H) 0.7 - 1.3 mg/dL Final  11/08/2014 11:00 AM 1.9 (H) 0.7 - 1.3 mg/dL Final  09/26/2014 09:38 AM 1.6 (H) 0.7 - 1.3 mg/dL Final  07/14/2014 10:53 AM 1.7 (H)  0.7 - 1.3 mg/dL Final  06/16/2014 10:46 AM 1.5 (H) 0.7 - 1.3 mg/dL Final  05/19/2014 10:35 AM 1.5 (H) 0.7 - 1.3 mg/dL Final  05/05/2014 10:05 AM 1.4 (H) 0.7 - 1.3 mg/dL Final  04/28/2014 09:43 AM 1.6 (H) 0.7 - 1.3 mg/dL Final  04/14/2014 10:47 AM 1.8 (H) 0.7 - 1.3 mg/dL Final  04/07/2014 08:30 AM 1.8 (H) 0.7 - 1.3 mg/dL Final  03/31/2014 12:00 PM 1.7 (H) 0.7 - 1.3 mg/dL Final  03/24/2014 09:38 AM 1.7 (H) 0.7 - 1.3 mg/dL Final  03/02/2014 08:15 AM 1.7 (H) 0.7 - 1.3 mg/dL Final  02/16/2014 09:34 AM 1.7 (H) 0.7 - 1.3 mg/dL Final  08/09/2013 11:03 AM 1.5 (H) 0.7 - 1.3 mg/dL Final  01/08/2013 11:06 AM 1.7 (H) 0.7 - 1.3 mg/dL Final  09/28/2012 11:09 AM 1.8 (H) 0.7 -  1.3 mg/dL Final  06/29/2012 11:31 AM 2.0 (H) 0.7 - 1.3 mg/dL Final  05/18/2012 11:20 AM 1.7 (H) 0.7 - 1.3 mg/dL Final  03/30/2012 10:55 AM 1.7 (H) 0.7 - 1.3 mg/dL Final  02/03/2012 12:21 PM 1.7 (H) 0.7 - 1.3 mg/dL Final  01/06/2012 10:35 AM 1.7 (H) 0.7 - 1.3 mg/dL Final  12/09/2011 10:51 AM 1.7 (H) 0.7 - 1.3 mg/dL Final  11/11/2011 11:30 AM 1.6 (H) 0.7 - 1.3 mg/dL Final  10/07/2011 01:59 PM 1.4 (H) 0.7 - 1.3 mg/dL Final   Creatinine, Ser  Date/Time Value Ref Range Status  06/02/2019 07:39 AM 2.01 (H) 0.61 - 1.24 mg/dL Final  06/02/2019 02:46 AM 1.99 (H) 0.61 - 1.24 mg/dL Final  06/01/2019 02:51 AM 1.40 (H) 0.61 - 1.24 mg/dL Final  05/31/2019 06:01 AM 1.17 0.61 - 1.24 mg/dL Final  05/30/2019 09:39 AM 1.41 (H) 0.61 - 1.24 mg/dL Final  05/29/2019 02:48 AM 1.78 (H) 0.61 - 1.24 mg/dL Final  05/11/2019 03:54 PM 2.70 (H) 0.61 - 1.24 mg/dL Final  05/13/2019 03:31 PM 2.65 (H) 0.61 - 1.24 mg/dL Final  05/25/2019 10:49 AM 1.79 (H) 0.61 - 1.24 mg/dL Final  05/18/2019 09:40 AM 2.09 (H) 0.61 - 1.24 mg/dL Final  05/04/2019 10:50 AM 1.61 (H) 0.61 - 1.24 mg/dL Final  04/27/2019 12:00 PM 1.39 (H) 0.61 - 1.24 mg/dL Final  04/22/2019 04:35 AM 1.26 (H) 0.61 - 1.24 mg/dL Final  04/21/2019 03:00 AM 1.27 (H) 0.61 - 1.24 mg/dL Final  04/20/2019 03:59 AM 1.25 (H) 0.61 - 1.24 mg/dL Final  04/19/2019 05:00 AM 1.42 (H) 0.61 - 1.24 mg/dL Final  04/18/2019 08:04 PM 1.56 (H) 0.61 - 1.24 mg/dL Final  04/13/2019 10:02 AM 2.21 (H) 0.61 - 1.24 mg/dL Final  04/06/2019 07:36 AM 1.80 (H) 0.61 - 1.24 mg/dL Final  03/29/2019 10:52 AM 2.06 (H) 0.61 - 1.24 mg/dL Final  03/23/2019 08:50 AM 1.91 (H) 0.61 - 1.24 mg/dL Final  02/15/2019 10:35 AM 2.03 (H) 0.61 - 1.24 mg/dL Final  01/18/2019 10:50 AM 1.97 (H) 0.61 - 1.24 mg/dL Final  12/21/2018 10:15 AM 1.81 (H) 0.61 - 1.24 mg/dL Final  11/23/2018 12:28 PM 1.65 (H) 0.61 - 1.24 mg/dL Final  10/26/2018 10:05 AM 1.50 (H) 0.61 - 1.24 mg/dL Final  09/28/2018 09:40 AM 1.61 (H) 0.61  - 1.24 mg/dL Final  08/31/2018 10:40 AM 1.82 (H) 0.61 - 1.24 mg/dL Final  08/03/2018 09:44 AM 1.89 (H) 0.61 - 1.24 mg/dL Final  06/08/2018 11:55 AM 1.80 (H) 0.61 - 1.24 mg/dL Final  05/11/2018 10:45 AM 1.54 (H) 0.61 - 1.24 mg/dL Final  04/13/2018 11:16 AM 1.69 (H) 0.61 - 1.24 mg/dL Final  03/19/2018 07:00 PM 1.64 (H) 0.61 - 1.24 mg/dL Final  03/16/2018 12:44 PM 1.74 (H) 0.61 - 1.24 mg/dL Final  02/16/2018 09:52 AM 2.07 (H) 0.61 - 1.24 mg/dL Final  01/19/2018 09:47 AM 1.70 (H) 0.61 - 1.24 mg/dL Final  12/22/2017 10:39 AM 1.47 (H) 0.61 - 1.24 mg/dL Final  11/24/2017 10:44 AM 1.74 (H) 0.61 - 1.24 mg/dL Final  10/27/2017 10:14 AM 1.83 (H) 0.61 - 1.24 mg/dL Final  09/29/2017 10:22 AM 1.93 (H) 0.61 - 1.24 mg/dL Final  08/28/2017 10:04 AM 1.76 (H) 0.61 - 1.24 mg/dL Final  07/31/2017 08:59 AM 1.85 (H) 0.61 - 1.24 mg/dL Final  07/03/2017 09:41 AM 2.06 (H) 0.61 - 1.24 mg/dL Final  06/05/2017 09:34 AM 1.72 (H) 0.70 - 1.30 mg/dL Final  05/05/2017 09:29 AM 1.32 (H) 0.70 - 1.30 mg/dL Final  04/03/2017 08:11 AM 1.54 (H) 0.70 - 1.30 mg/dL Final  03/06/2017 09:07 AM 1.66 (H) 0.70 - 1.30 mg/dL Final  02/06/2017 08:12 AM 1.81 (H) 0.70 - 1.30 mg/dL Final  12/06/2016 06:24 PM 2.27 (H) 0.61 - 1.24 mg/dL Final  10/18/2016 06:30 AM 2.24 (H) 0.61 - 1.24 mg/dL Final  10/17/2016 12:56 AM 2.68 (H) 0.61 - 1.24 mg/dL Final  10/16/2016 09:41 PM 2.30 (H) 0.61 - 1.24 mg/dL Final     PMHx:   Past Medical History:  Diagnosis Date  . Anemia 11/27/2010  . Cancer ([place])   . Chronic renal insufficiency, stage III (moderate) 06/17/2011   Due to myeloma & HTN  . CKD (chronic kidney disease), stage III 02/05/2011  . 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 Diarrhea 06/17/2011   Hospital admission 05/30/11 velcade toxicity? Infectious?  Marland Kitchen Hyperlipemia   . Hypertension, benign essential, goal below 140/90 11/27/2010  . Hypokalemia with normal acid-base balance 07/29/2011  . Hypomagnesemia 07/29/2011  . Seasonal allergies      Past Surgical History:  Procedure Laterality Date  . COLONOSCOPY  07/26/2011   Procedure: COLONOSCOPY;  Surgeon: Cleotis Nipper, MD;  Location: [facility];  Service: Endoscopy;  Laterality: N/A;  . ESOPHAGOGASTRODUODENOSCOPY (EGD) WITH PROPOFOL Left 10/17/2016   Procedure: ESOPHAGOGASTRODUODENOSCOPY (EGD) WITH PROPOFOL;  Surgeon: Ronnette Juniper, MD;  Location: [facility];  Service: Gastroenterology;  Laterality: Left;  . EYE SURGERY     bilateral cataract  surgery  . MULTIPLE EXTRACTIONS WITH ALVEOLOPLASTY N/A 05/27/2014   Procedure: Extraction of tooth #'s 1,6,7,8,10,11,13,21,22,23,24,25,26,27 and 28 with alveoloplasty;  Surgeon: Lenn Cal, DDS;  Location: [facility];  Service: Oral Surgery;  Laterality: N/A;  . porta cath      for chemotherapy- right chest area    Family Hx:  Family History  Problem Relation Age of Onset  . Cancer Brother        lung ca    Social History:  reports that he has never smoked. He has never used smokeless tobacco. He reports that he does not drink alcohol or use drugs.  Allergies: No Known Allergies  Medications: Prior to Admission medications   Medication Sig Start Date End Date Taking? Authorizing Provider  acyclovir (ZOVIRAX) 400 MG tablet Take 1 tablet (400 mg total) by mouth daily. 03/01/19  Yes Gorsuch, Ni, MD  allopurinol (ZYLOPRIM) 100 MG tablet Take 100 mg by mouth 2 (two) times daily.  12/06/12  Yes [provider]  amLODipine (NORVASC) 10 MG tablet Take 10 mg by mouth daily. 07/19/16  Yes [provider]  aspirin EC 81 MG tablet Take 81 mg by mouth daily.   Yes [provider]  atorvastatin (LIPITOR) 20 MG tablet Take 20 mg by mouth at bedtime.    Yes [provider]  brimonidine (ALPHAGAN) 0.2 % ophthalmic solution Place 1 drop into the right eye 3 (three) times daily.  02/02/18  Yes [provider]  calcium carbonate (TUMS - DOSED IN MG ELEMENTAL CALCIUM) 500 MG chewable tablet Chew 1  tablet by mouth daily as needed for indigestion or heartburn.   Yes [provider]  calcium-vitamin D (OSCAL WITH D) 500-200 MG-UNIT tablet Take 2 tablets by mouth 3 (three) times daily. 04/22/19  Yes Eugenie Filler, MD  dexamethasone (DECADRON) 4 MG tablet Take 4 tabs on days 1 and 3 every week, take with food, preferably in the morning 05/18/19  Yes Gorsuch, Ni, MD  dorzolamide-timolol (COSOPT) 22.3-6.8 MG/ML ophthalmic solution Place 1 drop into the right eye 2 (two) times daily.  07/29/13  Yes [provider]  EUTHYROX 75 MCG tablet Take 75 mcg by mouth daily before breakfast. 01/16/19  Yes [provider]  latanoprost (XALATAN) 0.005 % ophthalmic solution Place 1 drop into the right eye at bedtime.   Yes [provider]  ondansetron (ZOFRAN) 8 MG tablet Take 1 tablet (8 mg total) by mouth every 8 (eight) hours as needed (Nausea or vomiting). 03/01/19  Yes Gorsuch, Ni, MD  pantoprazole (PROTONIX) 40 MG tablet TAKE 1 TABLET BY MOUTH EVERY DAY 03/08/19  Yes Alvy Bimler, Ernst Spell, MD  selinexor 20 MG TBPK Take 60 mg twice weekly on Tuesdays and Thursdays 05/25/19  Yes Gorsuch, Ni, MD  sucralfate (CARAFATE) 1 GM/10ML suspension  05/11/19  Yes [provider]  fluorometholone (FML) 0.1 % ophthalmic suspension Place 1 drop into the right eye daily.     [provider]  lidocaine-prilocaine (EMLA) cream Apply to affected area once 06/08/18   Heath Lark, MD  magnesium oxide (MAG-OX) 400 (241.3 Mg) MG tablet Take 1 tablet (400 mg total) by mouth daily. 04/27/19   Heath Lark, MD  prochlorperazine (COMPAZINE) 10 MG tablet Take 1 tablet (10 mg total) by mouth every 6 (six) hours as needed for nausea or vomiting. 03/01/19   Heath Lark, MD     Labs:  Results for orders placed or performed during the hospital encounter of 05/25/2019 (from the past 48 hour(s))  Potassium     Status: Abnormal   Collection Time: 05/31/19  2:38 PM  Result Value Ref Range   Potassium 3.4 (L)  3.5 - 5.1 mmol/L    Comment: DELTA CHECK NOTED NO VISIBLE HEMOLYSIS REPEATED TO VERIFY Performed at [facility] [address], [place], [place] [address]   CBC     Status: Abnormal   Collection Time: 06/01/19  2:51 AM  Result Value Ref Range   WBC 7.5 4.0 - 10.5 K/uL   RBC 3.06 (L) 4.22 - 5.81 MIL/uL   Hemoglobin 9.1 (L) 13.0 - 17.0 g/dL   HCT 27.4 (L) 39.0 - 52.0 %   MCV 89.5 80.0 - 100.0 fL   MCH 29.7 26.0 - 34.0 pg   MCHC 33.2 30.0 - 36.0 g/dL   RDW 23.1 (H) 11.5 - 15.5 %   Platelets 18 (LL) 150 - 400 K/uL    Comment: CRITICAL VALUE NOTED.  VALUE IS CONSISTENT WITH PREVIOUSLY REPORTED AND CALLED VALUE. Immature Platelet Fraction may be clinically indicated, consider ordering this additional test [id number] REPEATED TO VERIFY    nRBC 0.0 0.0 - 0.2 %    Comment: Performed at [facility],  [place] [address] [place], [place] [address]  Comprehensive metabolic panel     Status: Abnormal   Collection Time: 06/01/19  2:51 AM  Result Value Ref Range   Sodium 137 135 - 145 mmol/L   Potassium 4.2 3.5 - 5.1 mmol/L    Comment: DELTA CHECK NOTED   Chloride 112 (H) 98 - 111 mmol/L   CO2 16 (L) 22 - 32 mmol/L   Glucose, Bld 100 (H) 70 - 99 mg/dL    Comment: Glucose reference range applies only to samples taken after fasting for at least 8 hours.   BUN 20 8 - 23 mg/dL   Creatinine, Ser 1.40 (H) 0.61 - 1.24 mg/dL   Calcium 8.8 (L) 8.9 - 10.3 mg/dL   Total Protein 6.0 (L) 6.5 - 8.1 g/dL   Albumin 2.9 (L) 3.5 - 5.0 g/dL   AST 18 15 - 41 U/L   ALT 27 0 - 44 U/L   Alkaline Phosphatase 63 38 - 126 U/L   Total Bilirubin 0.8 0.3 - 1.2 mg/dL   GFR calc non Af Amer 48 (L) >60 mL/min   GFR calc Af Amer 56 (L) >60 mL/min   Anion gap 9 5 - 15    Comment: Performed at [facility], [place] [address], [place], [place] [address]  CBC     Status: Abnormal   Collection Time: 06/02/19  2:46 AM  Result Value Ref Range   WBC 5.2 4.0 - 10.5 K/uL    RBC 2.49 (L) 4.22 - 5.81 MIL/uL   Hemoglobin 7.4 (L) 13.0 - 17.0 g/dL   HCT 22.1 (L) 39.0 - 52.0 %   MCV 88.8 80.0 - 100.0 fL   MCH 29.7 26.0 - 34.0 pg   MCHC 33.5 30.0 - 36.0 g/dL   RDW 23.4 (H) 11.5 - 15.5 %   Platelets 12 (LL) 150 - 400 K/uL    Comment: SPECIMEN CHECKED FOR CLOTS CRITICAL VALUE NOTED.  VALUE IS CONSISTENT WITH PREVIOUSLY REPORTED AND CALLED VALUE. Immature Platelet Fraction may be clinically indicated, consider ordering this additional test [id number] PLATELET COUNT CONFIRMED BY SMEAR    nRBC 0.6 (H) 0.0 - 0.2 %    Comment: Performed at [facility], [place] [address], [place], [place] [address]  Comprehensive metabolic panel     Status: Abnormal   Collection Time: 06/02/19  2:46 AM  Result Value Ref Range   Sodium 133 (L) 135 - 145 mmol/L   Potassium 5.8 (H) 3.5 - 5.1 mmol/L    Comment: DELTA CHECK NOTED NO VISIBLE HEMOLYSIS    Chloride 111 98 - 111 mmol/L   CO2 15 (L) 22 - 32 mmol/L   Glucose, Bld 84 70 - 99 mg/dL    Comment: Glucose reference range applies only to samples taken after fasting for at least 8 hours.   BUN 32 (H) 8 - 23 mg/dL   Creatinine, Ser 1.99 (H) 0.61 - 1.24 mg/dL   Calcium 8.8 (L) 8.9 - 10.3 mg/dL   Total Protein 5.0 (L) 6.5 - 8.1 g/dL   Albumin 2.6 (L) 3.5 - 5.0 g/dL   AST 41 15 - 41 U/L   ALT 48 (H) 0 - 44 U/L   Alkaline Phosphatase 60 38 - 126 U/L   Total Bilirubin 0.8 0.3 - 1.2 mg/dL   GFR calc non Af Amer 32 (L) >60 mL/min   GFR calc Af Amer 37 (L) >60 mL/min   Anion gap 7 5 - 15  Comment: Performed at [facility], [place] [address] [place], [place] [address]  Magnesium     Status: None   Collection Time: 06/02/19  2:46 AM  Result Value Ref Range   Magnesium 1.8 1.7 - 2.4 mg/dL    Comment: Performed at [facility], [place] [address] [place], [place] [address]  Troponin I (High Sensitivity)     Status: Abnormal   Collection Time: 06/02/19  4:36 AM  Result Value Ref  Range   Troponin I (High Sensitivity) 702 (HH) <18 ng/L    Comment: CRITICAL RESULT CALLED TO, READ BACK BY AND VERIFIED WITH: NESMITH,S @ 0550 ON [id number] BY POTEAT,S (NOTE) Elevated high sensitivity troponin I (hsTnI) values and significant  changes across serial measurements may suggest ACS but many other  chronic and acute conditions are known to elevate hsTnI results.  Refer to the Links section for chest pain algorithms and additional  guidance. Performed at [facility], [place] [address], [place], [place] [address]   CK     Status: None   Collection Time: 06/02/19  7:39 AM  Result Value Ref Range   Total CK 117 49 - 397 U/L    Comment: Performed at [facility], [place] [address] [place], [place] [address]  Basic metabolic panel     Status: Abnormal   Collection Time: 06/02/19  7:39 AM  Result Value Ref Range   Sodium 134 (L) 135 - 145 mmol/L   Potassium 4.5 3.5 - 5.1 mmol/L    Comment: DELTA CHECK NOTED   Chloride 110 98 - 111 mmol/L   CO2 16 (L) 22 - 32 mmol/L   Glucose, Bld 72 70 - 99 mg/dL    Comment: Glucose reference range applies only to samples taken after fasting for at least 8 hours.   BUN 33 (H) 8 - 23 mg/dL   Creatinine, Ser 2.01 (H) 0.61 - 1.24 mg/dL   Calcium 9.0 8.9 - 10.3 mg/dL   GFR calc non Af Amer 31 (L) >60 mL/min   GFR calc Af Amer 36 (L) >60 mL/min   Anion gap 8 5 - 15    Comment: Performed at [facility], [place] [address] [place], [place] [address]  Troponin I (High Sensitivity)     Status: Abnormal   Collection Time: 06/02/19  7:39 AM  Result Value Ref Range   Troponin I (High Sensitivity) 776 (HH) <18 ng/L    Comment: DELTA CHECK NOTED CRITICAL RESULT CALLED TO, READ BACK BY AND VERIFIED WITH: Cipriano Mile RN 06/02/19 _0  LEE, L (NOTE) Elevated high sensitivity troponin I (hsTnI) values and significant  changes across serial measurements may suggest ACS but many other  chronic and acute  conditions are known to elevate hsTnI results.  Refer to the Links section for chest pain algorithms and additional  guidance. Performed at [facility], [place] [address] [place], [place] [address]   Glucose, capillary     Status: Abnormal   Collection Time: 06/02/19  8:59 AM  Result Value Ref Range   Glucose-Capillary 149 (H) 70 - 99 mg/dL    Comment: Glucose reference range applies only to samples taken after fasting for at least 8 hours.   *Note: Due to a large number of results and/or encounters for the requested time period, some results have not been displayed. A complete set of results can be found in Results Review.     ROS:  Unable to obtain  review of systems patient unresponsive  Physical Exam: Vitals:   06/02/19 0851 06/02/19 0900  BP:  (!) 97/55  Pulse:  64  Resp:  14  Temp: (!) 97.3 F (36.3 C)   SpO2:  100%     General: Cachectic nonresponsive no verbal response HEENT: Atraumatic normocephalic.  Oropharynx clear Eyes: Pupils round reactive conjunctiva normal Neck: Neck supple no thyromegaly no adenopathy JVP not elevated Heart: Regular rate and rhythm no murmurs rubs gallops Lungs: Lungs clear to auscultation Abdomen: Soft nontender no organosplenomegaly Extremities: No edema cyanosis or clubbing Skin: Warm dry no lesions   Assessment/Plan: 1 Acute kidney injury.  Bland appearing urine sediment.  No hydronephrosis.  Blood pressures appear to be soft with no antihypertensive medications.  Would consider 2D echo.  Serum cortisol levels.  Patient will need pressor support.  Seems to be unresponsive to IV fluids.  Continue to avoid nephrotoxins IV contrast.  Avoid vancomycin. 2. Hypertension/volume  -has received adequate volume challenge.  Is remained hypotensive and has a progressive decline in renal function with decreased urine output.  Would favor addition of pressors if necessary.  Patient is being followed by palliative medicine.  I would  make a decision not to escalate care at this point in the setting of recurrent multiple myeloma.  Would not recommend CRRT 3.  Hyperkalemia with acute kidney injury.  Repeat potassium appears to be improved. 4.  Metabolic acidosis.  We will change IV fluids to D5W with 3 A of bicarb 75 cc an hour 5.  Recurrent multiple myeloma palliative medicine following.  Prognosis remains poor with patient remains encephalopathic.   Sherril Croon 06/02/2019, 9:51 AM

## 2019-06-02 NOTE — Progress Notes (Signed)
Calvin Mccormick, their son and daughter, who later joined [place], met bedside of pt. Pt's wife wanted prayer but wanted to wait until their daughter arrived. They were appropriately tearful as they described the rapid decline of pt. We had prayer as requested. Family was appreciative of support. I will continue support as needed. Please page if needed.  [place], [place]   06/02/19 2200  Clinical Encounter Type  Visited With Family;Patient and family together

## 2019-06-02 NOTE — Progress Notes (Signed)
Daily Progress Note   Patient Name: Calvin Mccormick       Date: 06/02/2019 DOB: [birth year]  Age: 74 y.o. MRN#: [id number] Attending Physician: Antonieta Pert, MD Primary Care Physician: Lucianne Lei, MD Admit Date: 05/26/2019  Reason for Consultation/Follow-up: Establishing goals of care  Subjective:    Length of Stay: 5  Current Medications: Scheduled Meds:  . allopurinol  100 mg Oral BID  . brimonidine  1 drop Right Eye TID  . Chlorhexidine Gluconate Cloth  6 each Topical Daily  . dorzolamide-timolol  1 drop Right Eye BID  . fluorometholone  1 drop Right Eye Daily  . latanoprost  1 drop Right Eye QHS  . levothyroxine  37.5 mcg Intravenous Daily  . loperamide  2 mg Oral TID  . mouth rinse  15 mL Mouth Rinse BID  . sodium chloride flush  10-40 mL Intracatheter Q12H    Continuous Infusions: . famotidine (PEPCID) IV Stopped (06/02/19 1120)  . sodium bicarbonate 150 mEq in dextrose 5% 1000 mL      PRN Meds: acetaminophen **OR** acetaminophen, albuterol, ondansetron **OR** ondansetron (ZOFRAN) IV, sodium chloride flush  Physical Exam          Vital Signs: BP (!) 117/58 (BP Location: Right Arm)   Pulse 66   Temp (!) 96.3 F (35.7 C) (Axillary)   Resp 12   Ht '5\' 11"'  (1.803 m)   Wt 56.2 kg   SpO2 100%   BMI 17.28 kg/m  SpO2: SpO2: 100 % O2 Device: O2 Device: Room Air O2 Flow Rate:    Intake/output summary:   Intake/Output Summary (Last 24 hours) at 06/02/2019 1650 Last data filed at 06/02/2019 1600 Gross per 24 hour  Intake 1984.82 ml  Output 75 ml  Net 1909.82 ml   LBM: Last BM Date: 06/02/19 Baseline Weight: Weight: 56.2 kg Most recent weight: Weight: 56.2 kg       Palliative Assessment/Data:      Patient Active Problem List   Diagnosis Date Noted  . EKG  abnormalities   . Demand ischemia ([place])   . Palliative care encounter   . Acute encephalopathy 05/19/2019  . Multifocal pneumonia   . Sepsis with acute organ dysfunction without septic shock ([place])   . Hypothyroidism   . Decubitus skin ulcer 04/19/2019  . Community acquired pneumonia  04/18/2019  . CAP (community acquired pneumonia) 04/18/2019  . Hyperkalemia 04/13/2019  . Boil, leg 03/29/2019  . Generalized weakness 03/29/2019  . Drug-induced hyperglycemia 03/23/2019  . Shingles rash 02/15/2019  . Chronic diarrhea 09/28/2018  . Tooth infection 04/13/2018  . Iron deficiency anemia 02/06/2017  . Dry skin dermatitis 02/06/2017  . Esophagitis, acute 12/12/2016  . Acute blood loss anemia 10/18/2016  . Aspiration pneumonia ([place]) 10/16/2016  . AKI (acute kidney injury) ([place]) 10/16/2016  . Upper GI bleed 10/16/2016  . Gait instability 05/22/2016  . Physical debility 05/22/2016  . Pancytopenia, acquired ([place]) 03/27/2016  . Goals of care, counseling/discussion 02/25/2016  . Port catheter in place 01/15/2016  . Acute neck pain 11/23/2015  . Renal failure 04/08/2015  . Acute renal failure (ARF) ([place]) 04/08/2015  . Acute renal failure superimposed on stage 3 chronic kidney disease ([place]) 04/08/2015  . Nausea and vomiting 04/08/2015  . Hypocalcemia 04/08/2015  . Pancytopenia due to antineoplastic chemotherapy ([place]) 11/15/2014  . 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 Protein calorie malnutrition ([place]) 10/06/2014  . Neuropathy due to chemotherapeutic drug ([place]) 02/21/2014  . Cellulitis and abscess of hand 08/05/2011  . Hypomagnesemia 07/29/2011  . Hypokalemia with normal acid-base balance 07/29/2011  . HTN (hypertension) 07/24/2011  . Anemia in chronic renal disease 07/24/2011  . Dehydration 07/24/2011  . Diarrhea 06/17/2011  . Chronic renal insufficiency, stage III (moderate) 06/17/2011  . Hypokalemia 05/30/2011  . CKD (chronic kidney disease), stage III 02/05/2011  . Multiple myeloma in  relapse ([place]) 12/12/2010  . 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 male with a hx of relapsing multiple myeloma was currently on chemotherapy (dexamethasone andSenilexor), CKD 3, chronic pancytopenia, chronic diarrhea, hypertension, and hyperlipidemia.   Assessment:  acute metabolic encephalopathy AKI on III CKD Electrolyte abnormalities Multiple myeloma relapse.   Recommendations/Plan:  Call placed, son's number listed in the chart is a wrong number [phone]. I called patient's daughter Drayke Grabel and discussed with her.  Palliative medicine is specialized medical care for people living with serious illness. It focuses on providing relief from the symptoms and stress of a serious illness. The goal is to improve quality of life for both the patient and the family.  Goals of care: Broad aims of medical therapy in relation to the patient's values and preferences. Our aim is to provide medical care aimed at enabling patients to achieve the goals that matter most to them, given the circumstances of their particular medical situation and their constraints.   We talked about the patient's current condition. Discussed with her about current labs and imaging, current recommendations from cardiology SLP and renal standpoint.   I discussed with her frankly but compassionately about the patient being up against a lot of things, and him having a high likelihood of ongoing decline and decompensation in spite of current interventions.   Patient's daughter states that patient's wife and son will visit the hospital tonight. They plan on discussing goals of care amongst themselves tonight. A follow up phone call family meeting has been set up with them for 06-03-19 at 9 AM. At that time, it appears prudent to recommend full scope of comfort measures and hospice. PMT to follow.    Code Status:    Code Status Orders   (From admission, onward)         Start     Ordered   05/26/2019  1806  Do not attempt resuscitation (DNR)  Continuous    Question Answer Comment  In the event of cardiac or respiratory ARREST Do not call a "code blue"   In the event of cardiac or respiratory ARREST Do not perform Intubation, CPR, defibrillation or ACLS   In the event of cardiac or respiratory ARREST Use medication by any route, position, wound care, and other measures to relive pain and suffering. May use oxygen, suction and manual treatment of airway obstruction as needed for comfort.      06/06/2019 1808        Code Status History    Date Active Date Inactive Code Status Order ID Comments User Context   04/19/2019 0415 04/22/2019 2346 Full Code [id number]  Toy Baker, MD ED   10/17/2016 0049 10/18/2016 1555 Full Code [id number]  Toy Baker, MD Inpatient   04/08/2015 0355 04/10/2015 1809 Full Code [id number]  Norval Morton, MD ED   05/27/2014 1138 05/27/2014 1440 Full Code [id number]  Lenn Cal, [place] Inpatient   03/03/2014 1529 03/04/2014 0343 Full Code [id number]  Corrie Mckusick, [place]   05/30/2011 0238 06/02/2011 1730 Full Code [id number]  Claude Manges, RN Inpatient   Advance Care Planning Activity       Prognosis:   guarded   Discharge Planning:  To Be Determined  Care plan was discussed with  Patient's daughter on the phone, also discussed with bedside staff.   Thank you for allowing the Palliative Medicine Team to assist in the care of this patient.   Time In: 1600 Time Out: 1635 Total Time 35 Prolonged Time Billed No       Greater than 50%  of this time was spent counseling and coordinating care related to the above assessment and plan.  Loistine Chance, MD  Please contact Palliative Medicine Team phone at [phone] for questions and concerns.

## 2019-06-02 NOTE — Progress Notes (Signed)
Pt's wife was bedside during my most recent visit. His siblings came and waited in the unit waiting room. I escorted family members back according to protocol. They were compliant and understood the necessity of limited visitation. Pt's wife remained bedside. Pt comes from a family of 29 with several remaining siblings.  Pt's wife and children were very appreciative of assistance.  Please page if additional assistance is needed.  [place], [place]   06/02/19 2300  Clinical Encounter Type  Visited With Family;Patient and family together

## 2019-06-02 NOTE — Progress Notes (Signed)
CRITICAL VALUE ALERT  Critical Value:  Troponin high sensitivity 702  Date & Time Notied:  06/02/19 0552  Provider Notified: Dr. Hal Hope  Orders Received/Actions taken: MD informed this RN that cardiology will be consulted

## 2019-06-02 NOTE — Progress Notes (Signed)
CRITICAL VALUE ALERT  Critical Value: CBG 63, Troponin 776   Date & Time Notified: 5/26 and 0857  Provider Notified: MD Maren Beach at [phone]   Orders Received/Actions taken: Followed protocol for hypoglycemia, repeat blood sugar 149. Will continue to monitor and assess.

## 2019-06-02 NOTE — Plan of Care (Signed)
Was called by the nurse, PT is having severe hypotension unresponsive and DNR. Discussed with wife on the phone. Advised to contact the rest of the family as patient may expire tonight. As per discussion with family patient to transition to comfort care.  Will order chaplain consult.  Family requests not turning off the monitor until daughter gets to her bedside.  Calvin Mccormick 8:28 PM

## 2019-06-02 NOTE — Progress Notes (Signed)
PROGRESS NOTE    BENNY DEUTSCHMAN  BDZ:[id number] DOB: Sep 04, 1945 DOA: 05/31/2019 PCP: Lucianne Lei, MD   Chef Complaints:unresponsiveness. Brief Narrative: 74 y.o. male with medical history significant for multiple myeloma in relapse currently on chemotherapy-dexamethasone 16 mg every Tuesday and Thursday and selinexor at 60 mg every Tuesday and Thursday followed by Dr. 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  Patient unable to provide history and presentation.  As per family patient developed significant nausea and vomiting after taking "stomach pills" yesterday afternoon and became less responsive and since then has been intermittently unresponsive sometimes walking up, not been eating well for the past 2 days.  No reported fever, chest pain, nausea, vomiting, fever, chills,, focal weakness, dysuria.  In ED:On presentation patient was minimally responsive, vitals stable saturating well on room air, temperature 97.4, blood work showed hyponatremia, worsening renal failure lactic acid is 4.1, hemoglobin 10.9 g, platelet 44,000.  Patient was given 1800 mL of fluid about to finish his bolus, since starting fluid patient has perked up is much more alert awake oriented knows he is in the hospital. When I saw his wife is at the bedside helping with history.  Last x-ray and CT head no acute findings. Repeat lactic acid has been sent, UA VBG, Covid screening pending. Patient is admitted with empiric antibiotics and aggressive IV fluid hydration.   BUN/creatinine ININTIALLY improving but again worsening despite iv hydration 5/25:Seen by speech and placed on dysphagia diet  Subjective:  Overnight labs showed hyperkalemia and EKG was obtained, that showed ST and T wave changes patient received calcium glucobnate. Repeat  troponin and BMP ordered Patient is seen this morning he denies any chest pain mumbling words able to tell me his name Not in acute distress on room air vitals stable Nursing reports rectal tube put out slowed down.    Assessment & Plan:  Acute metabolic encephalopathy: continues to be encephalopathic. Likely due to severe dehydration/uremic symptoms.  Patient had extensive evaluation with CT and MRI brain that did not show acute finding.  EEG showed moderate to severe diffuse encephalopathy.B12 300 and ammonia level normal, TSH 0.6.  I had discussed with Dr. Rory Percy from neurology.  He  is more alert awake but remains confused. No obvious infection on UA and chest x-ray blood culture negative and antibiotics were discontinued 5/24 as he was having diarrhea.  Seen by speech and place on dysphagia diet 5/25. Not able to take po,still very lethargic.Continue aspiration precaution PT OT.    AKI on CKD stage III/metabolic acidosis: Has baseline CKD from multiple myeloma with creatinine 1.2-1.6, bun IN 10s in 04/2019,slowly worsening recently  1.7-2.2.Significant dehydration AKI and uremia on admission.  BUN/creatinine was improving however now worsening-  Suspect ongoing volume loss from diarrhea but he has been on IV fluids. did renal ultrasound and no obstruction, check CK, UA.  Consulted and discussed with Dr. Justin Mend. Not a candidate for dialysis, advised palliative care and starting iv bicarb gtt. Recent Labs  Lab 05/30/19 0939 05/31/19 0601 06/01/19 0251 06/02/19 0246 06/02/19 0739  BUN 24* 17 20 32* 33*  CREATININE 1.41* 1.17 1.40* 1.99* 2.01*   Hyperkalemia: Received calcium gluconate.  Repeat BMP w k better Hypokalemia: k is high, off iv kcl  Recent Labs  Lab 05/31/19 0601 05/31/19 1438 06/01/19 0251 06/02/19 0246 06/02/19 0739  K 2.5* 3.4* 4.2 5.8*  4.5   Hypomagnesemia-resolved  Metabolic acidosis /lactic acidosis due to severe dehydration-lactic acid improved. off abx since no  obvious infection detected and cultures negative.  Bicarb remains low at 15-adding bicarb fluid per nephrology  Hypoglycemia: from poor intake. Continue as needed dextrose and monitor if persistent change fluids to dextrose.  Hypotonic hyponatremia -sodium overall stable.    Nausea and vomiting: Resolved.  Continue Pepcid supportive care.    Diarrhea : Unclear etiology could be due to antibiotics.  Rectal tube with slowing output . C. difficile negative.  Stopped antibiotics as #1.Continue Imodium.  Severe dehydration due to #1: He was aggressively hydrated remains on IV fluids due to ongoing diarrhea and diarrhea is improving.   Pancytopenia with anemia and thrombocytopenia: Platelet continues to drop, hemoglobin is also low as below.  Monitor closely.  Oncology following.Avoid heparin product due to low platelet count.Oncology on board and appreciate input.And advised Platelets transfusion if less than 10,000 or if any active bleeding noted.  Currently no signs of bleeding hemoglobin is up. normally around 9 g at baseline on admission hemoconcentrated and high at 10-11. Transfuse prbc as needed < 7 gm.  Recent Labs  Lab 05/29/19 0248 05/30/19 0939 05/31/19 0601 06/01/19 0251 06/02/19 0246  PLT 34* 24* 19* 18* 12*   Recent Labs  Lab 05/29/19 0248 05/30/19 0939 05/31/19 0601 06/01/19 0251 06/02/19 0246  HGB 8.7* 7.7* 7.5* 9.1* 7.4*  HCT 26.1* 23.0* 22.6* 27.4* 22.1*   Multiple Myeloma in relapse currently on chemo,dexamethasone 16 mg every Tuesday and Thursday and selinexor at 60 mg every Tuesday and Thursday followed by Dr. Alvy Bimler.  Oncology team is following closely.  Appreciate input.   Hypothyroidism:Euthyroid TSH 0.6.Continue iv synthroid  Goals of care:DNR DNI.  Palliative care following overall prognosis guarded to poor.Oncology following.  Monitor closely.Prognosis appears poor.   DVT prophylaxis:SCD. Code Status:DNR. Family Communication: I had updated patient's  family and admissions and subsequent days (wife and son, daughter phone goes to voice mail tied few times). Palliative care has been following closely and has been informed for need to follow up closely. Called his son and wife no answer. Rn has spoken to them today and aware about declining condition.  Status is: Inpatient Remains inpatient appropriate because:Persistent severe electrolyte disturbances, IV treatments appropriate due to intensity of illness or inability to take PO and Inpatient level of care appropriate due to severity of illness  Dispo: The patient is from:Home              Anticipated d/c is to:TBD              Anticipated d/c date VQ:MGQQ than 3 days              Patient currently is not medically stable to d/c. Nutrition: Diet Order            DIET - DYS 1 Room service appropriate? No; Fluid consistency: Thin  Diet effective now             Body mass index is 17.28 kg/m.  Consultants:see note  Procedures:  PYP:PJKD study is suggestive of moderate to severe diffuse encephalopathy, nonspecific etiology. No seizures or epileptiform discharges were seen throughout the recording  Microbiology:see note  Medications: Scheduled Meds: . allopurinol  100 mg Oral BID  . brimonidine  1 drop Right Eye TID  . Chlorhexidine Gluconate Cloth  6 each Topical Daily  . dorzolamide-timolol  1 drop Right Eye BID  . fluorometholone  1  drop Right Eye Daily  . latanoprost  1 drop Right Eye QHS  . levothyroxine  37.5 mcg Intravenous Daily  . loperamide  2 mg Oral TID  . mouth rinse  15 mL Mouth Rinse BID  . sodium chloride flush  10-40 mL Intracatheter Q12H   Continuous Infusions: . famotidine (PEPCID) IV Stopped (06/02/19 1120)  .  sodium bicarbonate (isotonic) infusion in sterile water 75 mL/hr at 06/02/19 1400    Antimicrobials: Anti-infectives (From admission, onward)   Start     Dose/Rate Route Frequency Ordered Stop   05/29/19 1600  ceFEPIme (MAXIPIME) 2 g in sodium  chloride 0.9 % 100 mL IVPB  Status:  Discontinued     2 g 200 mL/hr over 30 Minutes Intravenous Every 24 hours 05/30/2019 1916 05/31/19 0754   05/25/2019 1630  ceFEPIme (MAXIPIME) 2 g in sodium chloride 0.9 % 100 mL IVPB     2 g 200 mL/hr over 30 Minutes Intravenous  Once 05/17/2019 1619 05/31/2019 1803     Objective: Vitals: Today's Vitals   06/02/19 1000 06/02/19 1100 06/02/19 1200 06/02/19 1300  BP: (!) 105/58 122/60 95/66 (!) 113/52  Pulse: (!) 57 69 66 68  Resp: _0 Temp:  (!) 97.3 F (36.3 C)    TempSrc:  Axillary    SpO2: 100% 100% 93% 100%  Weight:      Height:      PainSc:   Asleep     Intake/Output Summary (Last 24 hours) at 06/02/2019 1605 Last data filed at 06/02/2019 1400 Gross per 24 hour  Intake 1836.12 ml  Output 75 ml  Net 1761.12 ml   Filed Weights   06/02/2019 1831  Weight: 56.2 kg   Weight change:    Intake/Output from previous day: 05/25 0701 - 05/26 0700 In: 2875.5 [P.O.:80; I.V.:2709.5; IV Piggyback:86] Out: 614 [Urine:700; Stool:75] Intake/Output this shift: Total I/O In: 559.9 [I.V.:509.8; IV Piggyback:50] Out: -   Examination:  General exam: Alert awake, leaning towards the right side, follows mild commands but confused.  On room air. HEENT:Oral mucosa dry, Ear/Nose WNL grossly, dentition normal. Respiratory system: bilaterally clear,no wheezing or crackles,no use of accessory muscle Cardiovascular system: S1 & S2 +, No JVD,. Gastrointestinal system: Abdomen soft, NT,ND, BS+ Nervous System:Alert, awake, moving extremities and grossly nonfocal Extremities: No edema, distal peripheral pulses palpable.  Skin: No rashes,no icterus. MSK: Normal muscle bulk,tone, power Rectal tube present   Data Reviewed: I have personally reviewed following labs and imaging studies CBC: Recent Labs  Lab 05/25/2019 1531 06/05/2019 1554 05/29/19 0248 05/30/19 0939 05/31/19 0601 06/01/19 0251 06/02/19 0246  WBC 8.2   < > 8.8 8.6 6.7 7.5 5.2  NEUTROABS  7.7  --   --   --   --   --   --   HGB 11.2*   < > 8.7* 7.7* 7.5* 9.1* 7.4*  HCT 31.7*   < > 26.1* 23.0* 22.6* 27.4* 22.1*  MCV 87.3   < > 89.4 90.6 89.7 89.5 88.8  PLT 44*   < > 34* 24* 19* 18* 12*   < > = values in this interval not displayed.   Basic Metabolic Panel: Recent Labs  Lab 05/30/19 0939 05/30/19 1114 05/30/19 2114 05/31/19 0601 05/31/19 1438 06/01/19 0251 06/02/19 0246 06/02/19 0739  NA 140  --   --  140  --  137 133* 134*  K 2.4*  --    < > 2.5* 3.4* 4.2 5.8* 4.5  CL 113*  --   --  112*  --  112* 111 110  CO2 22  --   --  21*  --  16* 15* 16*  GLUCOSE 149*  --   --  133*  --  100* 84 72  BUN 24*  --   --  17  --  20 32* 33*  CREATININE 1.41*  --   --  1.17  --  1.40* 1.99* 2.01*  CALCIUM 7.3*  --   --  7.5*  --  8.8* 8.8* 9.0  MG  --  1.5*  --   --   --   --  1.8  --    < > = values in this interval not displayed.   GFR: Estimated Creatinine Clearance: 24.9 mL/min (A) (by C-G formula based on SCr of 2.01 mg/dL (H)). Liver Function Tests: Recent Labs  Lab 05/30/2019 1531 05/30/19 0939 05/31/19 0601 06/01/19 0251 06/02/19 0246  AST _0 41  ALT _1 48*  ALKPHOS 57 49 55 63 60  BILITOT 1.2 0.7 0.7 0.8 0.8  PROT 6.7 5.2* 5.1* 6.0* 5.0*  ALBUMIN 3.6 2.5* 2.4* 2.9* 2.6*   Recent Labs  Lab 05/26/2019 1531  LIPASE 35   Recent Labs  Lab 05/17/2019 1531  AMMONIA 14   Coagulation Profile: Recent Labs  Lab 06/04/2019 1531  INR 1.2   Cardiac Enzymes: Recent Labs  Lab 06/02/19 0739  CKTOTAL 117   BNP (last 3 results) No results for input(s): PROBNP in the last 8760 hours. HbA1C: No results for input(s): HGBA1C in the last 72 hours. CBG: Recent Labs  Lab 06/01/2019 1525 05/30/19 0011 06/02/19 0859 06/02/19 1246 06/02/19 1557  GLUCAP 225* 128* 149* 81 80   Lipid Profile: No results for input(s): CHOL, HDL, LDLCALC, TRIG, CHOLHDL, LDLDIRECT in the last 72 hours. Thyroid Function Tests: No results for input(s): TSH, T4TOTAL,  FREET4, T3FREE, THYROIDAB in the last 72 hours. Anemia Panel: Recent Labs    05/31/19 0601  VITAMINB12 300   Sepsis Labs: Recent Labs  Lab 05/19/2019 1531 05/19/2019 1727 05/31/2019 2300  LATICACIDVEN 4.1* 3.1* 2.3*    Recent Results (from the past 240 hour(s))  Urine culture     Status: None   Collection Time: 05/21/2019  3:27 PM   Specimen: Urine, Clean Catch  Result Value Ref Range Status   Specimen Description   Final    URINE, CLEAN CATCH Performed at [facility], [place] [address], [place], [place] [address]    Special Requests   Final    NONE Performed at [facility], [place] [address] [place], [place] [address]    Culture   Final    NO GROWTH Performed at [facility], [place] [address], [place], [place] [address]    Report Status 05/30/2019 FINAL  Final  SARS Coronavirus 2 by RT PCR (hospital order, performed in [facility] hospital lab) Nasopharyngeal Nasopharyngeal Swab     Status: None   Collection Time: 05/24/2019  4:12 PM   Specimen: Nasopharyngeal Swab  Result Value Ref Range Status   SARS Coronavirus 2 NEGATIVE NEGATIVE Final    Comment: (NOTE) SARS-CoV-2 target nucleic acids are NOT DETECTED. The SARS-CoV-2 RNA is generally detectable in upper and lower respiratory specimens during the acute phase of infection. The lowest concentration of SARS-CoV-2 viral copies this assay can detect is 250 copies / mL. A negative result does not preclude SARS-CoV-2 infection and should not be used as the sole  basis for treatment or other patient management decisions.  A negative result may occur with improper specimen collection / handling, submission of specimen other than nasopharyngeal swab, presence of viral mutation(s) within the areas targeted by this assay, and inadequate number of viral copies (<250 copies / mL). A negative result must be combined with clinical observations, patient history, and epidemiological information. Fact  Sheet for Patients:   StrictlyIdeas.no Fact Sheet for Healthcare Providers: BankingDealers.co.za This test is not yet approved or cleared  by the [place] FDA and has been authorized for detection and/or diagnosis of SARS-CoV-2 by FDA under an Emergency Use Authorization (EUA).  This EUA will remain in effect (meaning this test can be used) for the duration of the COVID-19 declaration under Section 564(b)(1) of the Act, 21 U.S.C. section 360bbb-3(b)(1), unless the authorization is terminated or revoked sooner. Performed at [facility], [place] [address] [place], [place] [address]   Culture, blood (routine x 2)     Status: None   Collection Time: 05/15/2019  4:18 PM   Specimen: BLOOD  Result Value Ref Range Status   Specimen Description   Final    BLOOD SITE NOT SPECIFIED Performed at [place] [address], [place], [place] [address]    Special Requests   Final    BOTTLES DRAWN AEROBIC AND ANAEROBIC Blood Culture adequate volume Performed at [place] [address], [place], [place] [address]    Culture   Final    NO GROWTH 5 DAYS Performed at [facility], [place] [address] [place], [place] [address]    Report Status 06/02/2019 FINAL  Final  MRSA PCR Screening     Status: None   Collection Time: 05/13/2019  6:25 PM   Specimen: Nasal Mucosa; Nasopharyngeal  Result Value Ref Range Status   MRSA by PCR NEGATIVE NEGATIVE Final    Comment:        The GeneXpert MRSA Assay (FDA approved for NASAL specimens only), is one component of a comprehensive MRSA colonization surveillance program. It is not intended to diagnose MRSA infection nor to guide or monitor treatment for MRSA infections. Performed at [facility], [place] [address] [place], [place] [address]   Culture, blood (routine x 2)     Status: None (Preliminary result)   Collection  Time: 05/09/2019  6:47 PM   Specimen: BLOOD  Result Value Ref Range Status   Specimen Description   Final    BLOOD RIGHT ANTECUBITAL Performed at [place] [address], [place], [place] [address]    Special Requests   Final    BOTTLES DRAWN AEROBIC AND ANAEROBIC Blood Culture adequate volume Performed at [place] [address], [place], [place] [address]    Culture   Final    NO GROWTH 4 DAYS Performed at [facility], [place] [address], [place], [place] [address]    Report Status PENDING  Incomplete  C Difficile Quick Screen w PCR reflex     Status: None   Collection Time: 05/30/19 10:17 PM   Specimen: STOOL  Result Value Ref Range Status   C Diff antigen NEGATIVE NEGATIVE Final   C Diff toxin NEGATIVE NEGATIVE Final   C Diff interpretation No C. difficile detected.  Final    Comment: Performed at [facility], [place] [address] [place] [address] [place],  [address]      Radiology Studies: US RENAL  Result Date:  06/02/2019 CLINICAL DATA:  Acute kidney injury. EXAM: RENAL / URINARY TRACT ULTRASOUND COMPLETE COMPARISON:  CT abdomen pelvis 12/06/2016 FINDINGS: Right Kidney: Renal measurements: 8.6 x 4.3 x 3.7 cm = volume: 71 mL. Increased echogenicity of the renal cortex. No mass or hydronephrosis. Left Kidney: Renal measurements: 10.2 x 5.1 x 4.3 cm = volume: 116 mL. Increased cortical echogenicity. No obstruction. 4 cm left lower pole renal cyst. Bladder: Appears normal for degree of bladder distention. Bladder volume 91 cc Other: None. IMPRESSION: Negative for renal obstruction. Relatively small kidneys with increased cortical echogenicity suggesting chronic kidney disease. Electronically Signed   By: Franchot Gallo M.D.   On: 06/02/2019 08:04     LOS: 5 days   Antonieta Pert, MD [facility]  06/02/2019, 4:05 PM

## 2019-06-02 NOTE — Progress Notes (Signed)
SLP Cancellation Note  Patient Details Name: Calvin Mccormick MRN: [id number] DOB: 11-Nov-1945   Cancelled treatment:       Reason Eval/Treat Not Completed: Fatigue/lethargy limiting ability to participate. RN reports pt is currently insufficiently arousable for po intake at this time. SLP will continue to follow.  Ericka Marcellus B. Quentin Ore, [facility], [place] Speech Language Pathologist Office: [phone]  Shonna Chock 06/02/2019, 11:51 AM

## 2019-06-02 NOTE — Progress Notes (Signed)
  Echocardiogram 2D Echocardiogram has been performed.  Calvin Mccormick 06/02/2019, 6:24 PM

## 2019-06-03 ENCOUNTER — Encounter (HOSPITAL_COMMUNITY): Payer: Self-pay

## 2019-06-03 DIAGNOSIS — I5041 Acute combined systolic (congestive) and diastolic (congestive) heart failure: Secondary | ICD-10-CM

## 2019-06-03 LAB — CULTURE, BLOOD (ROUTINE X 2)
Culture: NO GROWTH
Special Requests: ADEQUATE

## 2019-06-03 LAB — GLUCOSE, CAPILLARY: Glucose-Capillary: 63 mg/dL — ABNORMAL LOW (ref 70–99)

## 2019-06-03 MED ORDER — METHOCARBAMOL 1000 MG/10ML IJ SOLN
500.0000 mg | Freq: Four times a day (QID) | INTRAVENOUS | Status: DC | PRN
  Filled 2019-06-03: qty 5

## 2019-06-03 MED ORDER — HYDROMORPHONE HCL 1 MG/ML IJ SOLN
0.5000 mg | INTRAMUSCULAR | Status: DC | PRN
  Administered 2019-06-03 – 2019-06-07 (×18): 0.5 mg via INTRAVENOUS
  Filled 2019-06-03 (×3): qty 0.5
  Filled 2019-06-03: qty 1
  Filled 2019-06-03 (×12): qty 0.5
  Filled 2019-06-03 (×2): qty 1
  Filled 2019-06-03: qty 0.5

## 2019-06-03 NOTE — Progress Notes (Signed)
Daily Progress Note   Patient Name: Calvin Mccormick       Date: 06/03/2019 DOB: 12/26/1945  Age: 74 y.o. MRN#: [id number] Attending Physician: Antonieta Pert, MD Primary Care Physician: Lucianne Lei, MD Admit Date: 05/27/2019  Reason for Consultation/Follow-up: Establishing goals of care  Subjective:   Overnight events noted: Plan shifted to establishment of comfort measures after patient had ongoing hypotension and unresponsiveness.  Patient remains unresponsive, resting comfortably.  Does not have nonverbal gestures of distress or discomfort.  Son Mr. Zeidman holding vigil at the bedside.  I discussed with him in detail about patient's current condition, concept of comfort measures, prognosis of hours to days and possible anticipated hospital death, end-of-life signs and symptoms.  All of his questions addressed to the best of my ability.  Patient's wife has gone home currently to gather some more supplies and to get some rest before she arrives back at the hospital.  See below.  Length of Stay: 6  Current Medications: Scheduled Meds:  . allopurinol  100 mg Oral BID  . brimonidine  1 drop Right Eye TID  . Chlorhexidine Gluconate Cloth  6 each Topical Daily  . dorzolamide-timolol  1 drop Right Eye BID  . fluorometholone  1 drop Right Eye Daily  . latanoprost  1 drop Right Eye QHS  . levothyroxine  37.5 mcg Intravenous Daily  . loperamide  2 mg Oral TID  . mouth rinse  15 mL Mouth Rinse BID  . sodium chloride flush  10-40 mL Intracatheter Q12H  . sodium chloride flush  3 mL Intravenous Q12H    Continuous Infusions: . sodium chloride Stopped (06/03/19 0025)  . famotidine (PEPCID) IV Stopped (06/02/19 1120)  . sodium bicarbonate 150 mEq in dextrose 5% 1000 mL Stopped (06/03/19 0023)    PRN  Meds: sodium chloride, acetaminophen **OR** acetaminophen, albuterol, antiseptic oral rinse, glycopyrrolate **OR** glycopyrrolate **OR** glycopyrrolate, HYDROmorphone (DILAUDID) injection, ondansetron **OR** ondansetron (ZOFRAN) IV, polyvinyl alcohol, sodium chloride flush, sodium chloride flush  Physical Exam         Appears weak and deconditioned Unresponsive Shallow regular breathing S1-S2 Muscle wasting, no edema Abdomen not distended  Vital Signs: BP (!) 113/56 (BP Location: Right Arm)   Pulse 61   Temp (!) 97 F (36.1 C) (Axillary)   Resp 14   Ht 5'  11" (1.803 m)   Wt 56.2 kg   SpO2 100%   BMI 17.28 kg/m  SpO2: SpO2: 100 % O2 Device: O2 Device: Room Air O2 Flow Rate:    Intake/output summary:   Intake/Output Summary (Last 24 hours) at 06/03/2019 0943 Last data filed at 06/03/2019 0800 Gross per 24 hour  Intake 1178.99 ml  Output 775 ml  Net 403.99 ml   LBM: Last BM Date: 06/03/19 Baseline Weight: Weight: 56.2 kg Most recent weight: Weight: 56.2 kg       Palliative Assessment/Data:      Patient Active Problem List   Diagnosis Date Noted  . EKG abnormalities   . Demand ischemia ([place])   . Altered mental status   . Palliative care by specialist   . General weakness   . Palliative care encounter   . Acute encephalopathy 05/25/2019  . Multifocal pneumonia   . Sepsis with acute organ dysfunction without septic shock ([place])   . Hypothyroidism   . Decubitus skin ulcer 04/19/2019  . Community acquired pneumonia 04/18/2019  . CAP (community acquired pneumonia) 04/18/2019  . Hyperkalemia 04/13/2019  . Boil, leg 03/29/2019  . Generalized weakness 03/29/2019  . Drug-induced hyperglycemia 03/23/2019  . Shingles rash 02/15/2019  . Chronic diarrhea 09/28/2018  . Tooth infection 04/13/2018  . Iron deficiency anemia 02/06/2017  . Dry skin dermatitis 02/06/2017  . Esophagitis, acute 12/12/2016  . Acute blood loss anemia 10/18/2016  . Aspiration pneumonia ([place])  10/16/2016  . AKI (acute kidney injury) ([place]) 10/16/2016  . Upper GI bleed 10/16/2016  . Gait instability 05/22/2016  . Physical debility 05/22/2016  . Pancytopenia, acquired ([place]) 03/27/2016  . Goals of care, counseling/discussion 02/25/2016  . Port catheter in place 01/15/2016  . Acute neck pain 11/23/2015  . Renal failure 04/08/2015  . Acute renal failure (ARF) ([place]) 04/08/2015  . Acute renal failure superimposed on stage 3 chronic kidney disease ([place]) 04/08/2015  . Nausea and vomiting 04/08/2015  . Hypocalcemia 04/08/2015  . Pancytopenia due to antineoplastic chemotherapy ([place]) 11/15/2014  . 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 Protein calorie malnutrition ([place]) 10/06/2014  . Neuropathy due to chemotherapeutic drug ([place]) 02/21/2014  . Cellulitis and abscess of hand 08/05/2011  . Hypomagnesemia 07/29/2011  . Hypokalemia with normal acid-base balance 07/29/2011  . HTN (hypertension) 07/24/2011  . Anemia in chronic renal disease 07/24/2011  . Dehydration 07/24/2011  . Diarrhea 06/17/2011  . Chronic renal insufficiency, stage III (moderate) 06/17/2011  . Hypokalemia 05/30/2011  . CKD (chronic kidney disease), stage III 02/05/2011  . Multiple myeloma in relapse ([place]) 12/12/2010  . 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 male with a hx of relapsing multiple myeloma was currently on chemotherapy (dexamethasone andSenilexor), CKD 3, chronic pancytopenia, chronic diarrhea, hypertension, and hyperlipidemia.   Assessment:  acute metabolic encephalopathy AKI on III CKD Electrolyte abnormalities Multiple myeloma relapse.  Acute hypotension as well as unresponsiveness overnight on 5-26, goals of care discussed by on-call [facility] team and election of comfort measures was established. Recommendations/Plan:  Family meeting:  Discussed with patient's son Mr. Toste who is present at the bedside.  I checked in  with him about the patient's current condition, underlying conditions, limited prognosis and end-of-life signs and symptoms.  Offered active listening and supportive care.  Recommend comfort cart be provided to the family, recommend chaplain ongoing support.  Will opioid rotate from morphine to Dilaudid because of  patient's ongoing renal failure.  Patient son states that the patient has been having decline and has been suffering for some time now.      Code Status:    Code Status Orders  (From admission, onward)         Start     Ordered   05/27/2019 1806  Do not attempt resuscitation (DNR)  Continuous    Question Answer Comment  In the event of cardiac or respiratory ARREST Do not call a "code blue"   In the event of cardiac or respiratory ARREST Do not perform Intubation, CPR, defibrillation or ACLS   In the event of cardiac or respiratory ARREST Use medication by any route, position, wound care, and other measures to relive pain and suffering. May use oxygen, suction and manual treatment of airway obstruction as needed for comfort.      05/18/2019 1808        Code Status History    Date Active Date Inactive Code Status Order ID Comments User Context   04/19/2019 0415 04/22/2019 2346 Full Code [id number]  Toy Baker, MD ED   10/17/2016 0049 10/18/2016 1555 Full Code [id number]  Toy Baker, MD Inpatient   04/08/2015 0355 04/10/2015 1809 Full Code [id number]  Norval Morton, MD ED   05/27/2014 1138 05/27/2014 1440 Full Code [id number]  Lenn Cal, [place] Inpatient   03/03/2014 1529 03/04/2014 0343 Full Code [id number]  Corrie Mckusick, [place]   05/30/2011 0238 06/02/2011 1730 Full Code [id number]  Claude Manges, RN Inpatient   Advance Care Planning Activity       Prognosis:  Hours to some very limited number of days  Discharge Planning:  Anticipated hospital death.  Even though the patient's family understands concept of comfort measures and his rapid decline, son  states that " it is not fully hit me yet that he is dying."  Continue to support patient and family as a unit.   Care plan was discussed with  Patient's son at bedside as well as nursing staff.  Thank you for allowing the Palliative Medicine Team to assist in the care of this patient.   Time In: 9 Time Out: 9.35 Total Time 35 Prolonged Time Billed No       Greater than 50%  of this time was spent counseling and coordinating care related to the above assessment and plan.  Loistine Chance, MD  Please contact Palliative Medicine Team phone at [phone] for questions and concerns.

## 2019-06-03 NOTE — Progress Notes (Signed)
Progress Note  Patient Name: Calvin Mccormick Date of Encounter: 06/03/2019  Primary Cardiologist: Skeet Latch, MD   Subjective   Electrolytes improving. Palliative following, goals of care discussion today.   Inpatient Medications    Scheduled Meds: . allopurinol  100 mg Oral BID  . brimonidine  1 drop Right Eye TID  . Chlorhexidine Gluconate Cloth  6 each Topical Daily  . dorzolamide-timolol  1 drop Right Eye BID  . fluorometholone  1 drop Right Eye Daily  . latanoprost  1 drop Right Eye QHS  . levothyroxine  37.5 mcg Intravenous Daily  . loperamide  2 mg Oral TID  . mouth rinse  15 mL Mouth Rinse BID  . sodium chloride flush  10-40 mL Intracatheter Q12H  . sodium chloride flush  3 mL Intravenous Q12H   Continuous Infusions: . sodium chloride Stopped (06/03/19 0025)  . famotidine (PEPCID) IV Stopped (06/02/19 1120)  . sodium bicarbonate 150 mEq in dextrose 5% 1000 mL Stopped (06/03/19 0023)   PRN Meds: sodium chloride, acetaminophen **OR** acetaminophen, albuterol, antiseptic oral rinse, glycopyrrolate **OR** glycopyrrolate **OR** glycopyrrolate, morphine injection, ondansetron **OR** ondansetron (ZOFRAN) IV, polyvinyl alcohol, sodium chloride flush, sodium chloride flush   Vital Signs    Vitals:   06/03/19 0500 06/03/19 0530 06/03/19 0600 06/03/19 0800  BP: (!) 115/51 (!) 100/51 (!) 114/46 (!) 113/56  Pulse: 69 (!) 56 (!) 57 61  Resp: '13 11 11 14  ' Temp:      TempSrc:      SpO2: 100% 100% 100% 100%  Weight:      Height:        Intake/Output Summary (Last 24 hours) at 06/03/2019 0931 Last data filed at 06/03/2019 0800 Gross per 24 hour  Intake 1178.99 ml  Output 775 ml  Net 403.99 ml   Last 3 Weights 05/12/2019 05/25/2019 05/18/2019  Weight (lbs) 123 lb 14.4 oz 131 lb 14.4 oz 131 lb 6.4 oz  Weight (kg) 56.2 kg 59.829 kg 59.603 kg      Telemetry    NSr, HR 50-60s - Personally Reviewed  ECG    No new - Personally Reviewed  Physical Exam   GEN: No  acute distress.   Neck: No JVD Cardiac: RRR, no murmurs, rubs, or gallops.  Respiratory: Clear to auscultation bilaterally. GI: Soft, nontender, non-distended  MS: No edema; No deformity. Neuro:  Nonfocal  Psych: Normal affect   Labs    High Sensitivity Troponin:   Recent Labs  Lab 06/02/19 0436 06/02/19 0739  TROPONINIHS 702* 776*      Chemistry Recent Labs  Lab 05/31/19 0601 05/31/19 1438 06/01/19 0251 06/02/19 0246 06/02/19 0739  NA 140  --  137 133* 134*  K 2.5*   < > 4.2 5.8* 4.5  CL 112*  --  112* 111 110  CO2 21*  --  16* 15* 16*  GLUCOSE 133*  --  100* 84 72  BUN 17  --  20 32* 33*  CREATININE 1.17  --  1.40* 1.99* 2.01*  CALCIUM 7.5*  --  8.8* 8.8* 9.0  PROT 5.1*  --  6.0* 5.0*  --   ALBUMIN 2.4*  --  2.9* 2.6*  --   AST 18  --  18 41  --   ALT 28  --  27 48*  --   ALKPHOS 55  --  63 60  --   BILITOT 0.7  --  0.8 0.8  --   GFRNONAA >60  --  48* 32* 31*  GFRAA >60  --  56* 37* 36*  ANIONGAP 7  --  '9 7 8   ' < > = values in this interval not displayed.     Hematology Recent Labs  Lab 05/31/19 0601 06/01/19 0251 06/02/19 0246  WBC 6.7 7.5 5.2  RBC 2.52* 3.06* 2.49*  HGB 7.5* 9.1* 7.4*  HCT 22.6* 27.4* 22.1*  MCV 89.7 89.5 88.8  MCH 29.8 29.7 29.7  MCHC 33.2 33.2 33.5  RDW 23.5* 23.1* 23.4*  PLT 19* 18* 12*    BNPNo results for input(s): BNP, PROBNP in the last 168 hours.   DDimer No results for input(s): DDIMER in the last 168 hours.   Radiology    US RENAL  Result Date: 06/02/2019 CLINICAL DATA:  Acute kidney injury. EXAM: RENAL / URINARY TRACT ULTRASOUND COMPLETE COMPARISON:  CT abdomen pelvis 12/06/2016 FINDINGS: Right Kidney: Renal measurements: 8.6 x 4.3 x 3.7 cm = volume: 71 mL. Increased echogenicity of the renal cortex. No mass or hydronephrosis. Left Kidney: Renal measurements: 10.2 x 5.1 x 4.3 cm = volume: 116 mL. Increased cortical echogenicity. No obstruction. 4 cm left lower pole renal cyst. Bladder: Appears normal for degree of  bladder distention. Bladder volume 91 cc Other: None. IMPRESSION: Negative for renal obstruction. Relatively small kidneys with increased cortical echogenicity suggesting chronic kidney disease. Electronically Signed   By: Franchot Gallo M.D.   On: 06/02/2019 08:04    Cardiac Studies   Echo 06/02/19 Results pending  Patient Profile     74 y.o. male with a hx of relapsing multiple myeloma currently on chemotherapy (dexamethasone and Senilexor), CKD 3, chronic pancytopenia, chronic diarrhea, hypertension, and hyperlipidemia who is being seen today for the evaluation of elevated troponin .  Assessment & Plan    Elevated troponin/EKG changes - In the setting of AKI, electrolyte abnormalities, lactic acidosis from significant dehydration and uremia  - MRI negative for stroke - Troponin trend negative - Echo results pending - No plans for heparin, especially given pancytopenia - Do suspect further work-up   Goals of care - DNR, palliative following, prognosis overall poor - nurse reported plan for comfort care  For questions or updates, please contact [facility] Please consult [email or web address] for contact info under        Signed, Maddux First Ninfa Meeker, PA-C  06/03/2019, 9:31 AM

## 2019-06-03 NOTE — TOC Progression Note (Signed)
Transition of Care [facility]) - Progression Note    Patient Details  Name: Calvin Mccormick MRN: [id number] Date of Birth: [birth year]  [facility] CM/SW Contact  Leeroy Cha, RN Phone Number: 06/03/2019, 8:22 AM  Clinical Narrative:    Temp 97.3, not responsive,iv pecid, iv nahco3 at 75cc/hr, bld cultures x2x5 days =neg, family meeting has occurred may be hospital death, if not then residential hospice?   Expected Discharge Plan: Home/Self Care Barriers to Discharge: Continued Medical Work up  Expected Discharge Plan and Services Expected Discharge Plan: Home/Self Care   Discharge Planning Services: CM Consult   Living arrangements for the past 2 months: Single Family Home                                       Social Determinants of Health (SDOH) Interventions    Readmission Risk Interventions No flowsheet data found.

## 2019-06-03 NOTE — Progress Notes (Signed)
Physical Therapy Discharge Patient Details Name: MARQUAIL SCHLABACH MRN: [id number] DOB: 28-Jun-1945 Today's Date: 06/03/2019 Time:  -     Patient discharged from PT services secondary to decline. Please see latest therapy progress note for current level of functioning and progress toward goals.     GP     Claretha Cooper 06/03/2019, 8:34 AM [place] Pager [phone] Office [phone]

## 2019-06-03 NOTE — Progress Notes (Signed)
PROGRESS NOTE    Calvin Mccormick  FIE:[id number] DOB: [birth year] DOA: 05/26/2019 PCP: Lucianne Lei, MD   Chef Complaints:unresponsiveness. Brief Narrative: 74 y.o. male with medical history significant for multiple myeloma in relapse currently on chemotherapy-dexamethasone 16 mg every Tuesday and Thursday and selinexor at 60 mg every Tuesday and Thursday followed by Dr. 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  Patient unable to provide history and presentation.  As per family patient developed significant nausea and vomiting after taking "stomach pills" yesterday afternoon and became less responsive and since then has been intermittently unresponsive sometimes walking up, not been eating well for the past 2 days.  No reported fever, chest pain, nausea, vomiting, fever, chills,, focal weakness, dysuria.  In ED:On presentation patient was minimally responsive, vitals stable saturating well on room air, temperature 97.4, blood work showed hyponatremia, worsening renal failure lactic acid is 4.1, hemoglobin 10.9 g, platelet 44,000.  Patient was given 1800 mL of fluid about to finish his bolus, since starting fluid patient has perked up is much more alert awake oriented knows he is in the hospital. When I saw his wife is at the bedside helping with history.  Last x-ray and CT head no acute findings. Covid screening negative  Patient was admitted with empiric antibiotics and aggressive IV fluid hydration.   BUN/creatinine initial improving, lactic acid improved but again was worsening despite iv hydration. Cultures were negative and antibiotic discontinued patient was having diarrhea work-up negative with significant GI panel.  Overall mental status no significant improvement. Seen again by cardiology due to significant  elevated troponin EKG changes and echo showed acute severely reduced EF with this severe systolic dysfunction, EF 16-60% suspected Takotsubo cardiomyopathy.  Subjective: Acute events noted overnight.  Patient remained unresponsive and blood pressure hypotensive in 60s Dr. Roel Cluck discuss with the wife on the phone and per discussion with family was transitioned to comfort care.  This morning patient sleeping/does not wake up on calling his name/unresponsive Son at the bedside Saturating well on room air blood pressure stable Having urine output on the Foley and rectal tube with liquid stool  Assessment & Plan:  Acute metabolic encephalopathy: No significant improvement in his mental status and on 5/26  Night was hypotensive and minimally responsive and transitioned to comfort measures.  palliative Is already following closely and was planning on comfort measures discussion today. Suspect due to severe dehydration/uremic symptoms, multiple comorbidities, myeloma under chemotherapy now with acute systolic dysfunction.He had CT and MRI brain that did not show acute finding.  EEG showed moderate to severe diffuse encephalopathy.B12 300 and ammonia level normal, TSH 0.6.  I had discussed with Dr. Rory Percy from neurology.No obvious infection on UA and chest x-ray blood culture negative and antibiotics were discontinued 5/24 as he was having diarrhea.     AKI on CKD stage IIIa/metabolic acidosis/?cardiorenal syndrome: Has baseline CKD from multiple myeloma with creatinine 1.2-1.6, bun IN 10s in 04/2019,slowly worsening recently  1.7-2.2.Significant dehydration AKI and uremia on admission.  BUN/creatinine was improving however was worsening again 5/26.renal ultrasound and no obstruction, seen by Dr. Cheyenne Adas not to escalate care, and felt poor candidate for CRRT.  Labs and ivf  has been discontinued overnight. Creat 1.4>1.1>1.4>1.9>2.0  Acute Severe systolic dysfunction/elevated troponin with diffuse T wave  changes on the EKG, echo shows EF 20 to 25% with severe systolic dysfunction-in a  pattern consistent with Takotsubo cardiomyopathy as per cardiology: Seen by cardiology, no plan for ischemic evaluation and no reversible etiology identified and and agreed with transition to comfort measures.  Patient not treated with heparin due to severe thrombocytopenia.   Pancytopenia with anemia and thrombocytopenia: This has been worsening chemotherapy was dose adjusted. Platelet have been dropping was at 12k 06/02/19.m hb in mid 7.4 gm-seen by hem-onc.  Multiple Myeloma in relapse patient was getting  dexamethasone 16 mg every Tuesday and Thursday and selinexor at 60 mg every Tuesday and Thursday followed by Dr. Alvy Bimler.  who felt that patient did not tolerate chemo and felt that patient is not a candidate for any additional chemotherapy agreed with DNR/DNI and ongoing palliative care assistance. Appreciate input.   Hyperkalemia: k was 4.5,5/26  Hypomagnesemia- was resolved  Lactic acidosis: Lactate improved after initial IV fluids.  Patient was on bicarb infusion and has been discontinued overnight.  Hypoglycemia: Patient was getting dextrose with IV fluids bicarb now off.   Hypotonic hyponatremia -sodium overall stable.    Nausea and vomiting: Resolved.  Continue Pepcid supportive care.    Diarrhea : Unclear etiology could be due to antibiotics.  Rectal tube with slowing output . C. difficile negative.  Stopped antibiotics as #1.Continue Imodium.  Severe dehydration due to #1: He was aggressively hydrated remains on IV fluids due to ongoing diarrhea and diarrhea is improving.   Hypothyroidism:Euthyroid TSH 0.6.Continue iv synthroid  Goals of care:DNR DNI.  Palliative care is following closely discussed Dr. And well however was planning for comfort measures discussion with family this morning however patient has been transitioned to comfort measures last night due to sudden hypotension and  unresponsiveness.  Palliative care following closely continues to have poor prognosis  DVT prophylaxis:SCD. Code Status:DNR. Family Communication: I had updated patient's family and admissions and subsequent days (wife and son, daughter phone goes to voice mail tied few times). Palliative care has been following closely and and family were informed about overall guarded prognosis.  Overnight patient has been transitioned to comfort measures discussed with patient's son at the bedside who agrees with current plan of care.  He wants his father to be comfortable and he felt that his father did not want to go to inpatient hospice and patient will be kept in the [place]. He thinks it may be too much for family to take the patient home at thsi situation.  He said his family visited him last night after the event and he understands. I have answered all questions to the best of my abilities.  Status is: Inpatient Remains inpatient appropriate because of patient's overall declining condition.    Dispo: The patient is from:Home              Anticipated d/c is to:TBD              Anticipated d/c date YQ:MVHQ than 3 days              Patient currently is not medically stable to d/c. Nutrition: Diet Order            DIET - DYS 1 Room service appropriate? No; Fluid consistency: Thin  Diet effective now             Body mass index is 17.28 kg/m.  Consultants:see note  Procedures:  ION:GEXB study is suggestive of moderate to severe diffuse encephalopathy, nonspecific etiology. No seizures or epileptiform discharges were seen throughout the recording  Microbiology:see note  Medications: Scheduled  Meds: . allopurinol  100 mg Oral BID  . brimonidine  1 drop Right Eye TID  . Chlorhexidine Gluconate Cloth  6 each Topical Daily  . dorzolamide-timolol  1 drop Right Eye BID  . fluorometholone  1 drop Right Eye Daily  . latanoprost  1 drop Right Eye QHS  . levothyroxine  37.5 mcg Intravenous Daily  .  mouth rinse  15 mL Mouth Rinse BID  . sodium chloride flush  10-40 mL Intracatheter Q12H  . sodium chloride flush  3 mL Intravenous Q12H   Continuous Infusions: . sodium chloride Stopped (06/03/19 0025)  . famotidine (PEPCID) IV Stopped (06/03/19 1038)  . sodium bicarbonate 150 mEq in dextrose 5% 1000 mL Stopped (06/03/19 0023)   Antimicrobials: Anti-infectives (From admission, onward)   Start     Dose/Rate Route Frequency Ordered Stop   05/29/19 1600  ceFEPIme (MAXIPIME) 2 g in sodium chloride 0.9 % 100 mL IVPB  Status:  Discontinued     2 g 200 mL/hr over 30 Minutes Intravenous Every 24 hours 05/19/2019 1916 05/31/19 0754   05/10/2019 1630  ceFEPIme (MAXIPIME) 2 g in sodium chloride 0.9 % 100 mL IVPB     2 g 200 mL/hr over 30 Minutes Intravenous  Once 05/15/2019 1619 05/22/2019 1803     Objective: Vitals: Today's Vitals   06/03/19 1100 06/03/19 1200 06/03/19 1300 06/03/19 1400  BP: (!) 107/47 (!) 113/53 (!) 112/57 115/61  Pulse: (!) 59 (!) 52 (!) 57 61  Resp: '11 12 18 17  ' Temp:      TempSrc:      SpO2: 100% 97% 100% 100%  Weight:      Height:      PainSc: Asleep Asleep Asleep Asleep    Intake/Output Summary (Last 24 hours) at 06/03/2019 1434 Last data filed at 06/03/2019 0800 Gross per 24 hour  Intake 762.82 ml  Output 775 ml  Net -12.18 ml   Filed Weights   06/06/2019 1831  Weight: 56.2 kg   Weight change:    Intake/Output from previous day: 05/26 0701 - 05/27 0700 In: 1322.7 [I.V.:1272.7; IV Piggyback:50] Out: 161 [Urine:775] Intake/Output this shift: No intake/output data recorded.  Examination:  General exam: unresponsive appears comfortable HEENT:Oral mucosa moist, Ear/Nose WNL grossly, dentition normal. Respiratory system: bilaterally dimininshed, no wheezing or crackles,no use of accessory muscle Cardiovascular system: S1 & S2 +, No JVD. Gastrointestinal system: Abdomen soft, NT,ND,BS+. Nervous System: Unresponsive, no spontaneous movements. Extremities: No  edema, distal peripheral pulses palpable.  Skin: No rashes,no icterus. MSK: Normal muscle bulk,tone, power. Rectal tube present. Foley Catheter present.  Data Reviewed: I have personally reviewed following labs and imaging studies CBC: Recent Labs  Lab 05/22/2019 1531 06/04/2019 1554 05/29/19 0248 05/30/19 0939 05/31/19 0601 06/01/19 0251 06/02/19 0246  WBC 8.2   < > 8.8 8.6 6.7 7.5 5.2  NEUTROABS 7.7  --   --   --   --   --   --   HGB 11.2*   < > 8.7* 7.7* 7.5* 9.1* 7.4*  HCT 31.7*   < > 26.1* 23.0* 22.6* 27.4* 22.1*  MCV 87.3   < > 89.4 90.6 89.7 89.5 88.8  PLT 44*   < > 34* 24* 19* 18* 12*   < > = values in this interval not displayed.   Basic Metabolic Panel: Recent Labs  Lab 05/30/19 0939 05/30/19 1114 05/30/19 2114 05/31/19 0601 05/31/19 1438 06/01/19 0251 06/02/19 0246 06/02/19 0739  NA 140  --   --  140  --  137 133* 134*  K 2.4*  --    < > 2.5* 3.4* 4.2 5.8* 4.5  CL 113*  --   --  112*  --  112* 111 110  CO2 22  --   --  21*  --  16* 15* 16*  GLUCOSE 149*  --   --  133*  --  100* 84 72  BUN 24*  --   --  17  --  20 32* 33*  CREATININE 1.41*  --   --  1.17  --  1.40* 1.99* 2.01*  CALCIUM 7.3*  --   --  7.5*  --  8.8* 8.8* 9.0  MG  --  1.5*  --   --   --   --  1.8  --    < > = values in this interval not displayed.   GFR: Estimated Creatinine Clearance: 24.9 mL/min (A) (by C-G formula based on SCr of 2.01 mg/dL (H)). Liver Function Tests: Recent Labs  Lab 06/01/2019 1531 05/30/19 0939 05/31/19 0601 06/01/19 0251 06/02/19 0246  AST '15 19 18 18 ' 41  ALT '24 25 28 27 ' 48*  ALKPHOS 57 49 55 63 60  BILITOT 1.2 0.7 0.7 0.8 0.8  PROT 6.7 5.2* 5.1* 6.0* 5.0*  ALBUMIN 3.6 2.5* 2.4* 2.9* 2.6*   Recent Labs  Lab 05/23/2019 1531  LIPASE 35   Recent Labs  Lab 05/30/2019 1531  AMMONIA 14   Coagulation Profile: Recent Labs  Lab 05/23/2019 1531  INR 1.2   Cardiac Enzymes: Recent Labs  Lab 06/02/19 0739  CKTOTAL 117   BNP (last 3 results) No results for  input(s): PROBNP in the last 8760 hours. HbA1C: No results for input(s): HGBA1C in the last 72 hours. CBG: Recent Labs  Lab 05/30/19 0011 06/02/19 0832 06/02/19 0859 06/02/19 1246 06/02/19 1557  GLUCAP 128* 63* 149* 81 80   Lipid Profile: No results for input(s): CHOL, HDL, LDLCALC, TRIG, CHOLHDL, LDLDIRECT in the last 72 hours. Thyroid Function Tests: No results for input(s): TSH, T4TOTAL, FREET4, T3FREE, THYROIDAB in the last 72 hours. Anemia Panel: No results for input(s): VITAMINB12, FOLATE, FERRITIN, TIBC, IRON, RETICCTPCT in the last 72 hours. Sepsis Labs: Recent Labs  Lab 05/21/2019 1531 05/16/2019 1727 06/04/2019 2300  LATICACIDVEN 4.1* 3.1* 2.3*    Recent Results (from the past 240 hour(s))  Urine culture     Status: None   Collection Time: 05/21/2019  3:27 PM   Specimen: Urine, Clean Catch  Result Value Ref Range Status   Specimen Description   Final    URINE, CLEAN CATCH Performed at [facility], [place] [address] [place], [place] [address]    Special Requests   Final    NONE Performed at [facility], [place] [address] [place], [place] [address]    Culture   Final    NO GROWTH Performed at [facility], [place] [address] [place], [place] [address]    Report Status 05/30/2019 FINAL  Final  SARS Coronavirus 2 by RT PCR (hospital order, performed in [facility] hospital lab) Nasopharyngeal Nasopharyngeal Swab     Status: None   Collection Time: 05/14/2019  4:12 PM   Specimen: Nasopharyngeal Swab  Result Value Ref Range Status   SARS Coronavirus 2 NEGATIVE NEGATIVE Final    Comment: (NOTE) SARS-CoV-2 target nucleic acids are NOT DETECTED. The SARS-CoV-2 RNA is generally detectable in upper and lower respiratory specimens during the acute phase  of infection. The lowest concentration of SARS-CoV-2 viral copies this assay can detect is 250 copies / mL. A negative result does not preclude SARS-CoV-2 infection and should not be  used as the sole basis for treatment or other patient management decisions.  A negative result may occur with improper specimen collection / handling, submission of specimen other than nasopharyngeal swab, presence of viral mutation(s) within the areas targeted by this assay, and inadequate number of viral copies (<250 copies / mL). A negative result must be combined with clinical observations, patient history, and epidemiological information. Fact Sheet for Patients:   StrictlyIdeas.no Fact Sheet for Healthcare Providers: BankingDealers.co.za This test is not yet approved or cleared  by the [place] FDA and has been authorized for detection and/or diagnosis of SARS-CoV-2 by FDA under an Emergency Use Authorization (EUA).  This EUA will remain in effect (meaning this test can be used) for the duration of the COVID-19 declaration under Section 564(b)(1) of the Act, 21 U.S.C. section 360bbb-3(b)(1), unless the authorization is terminated or revoked sooner. Performed at [facility], [place] [address] [place], [place] [address]   Culture, blood (routine x 2)     Status: None   Collection Time: 06/06/2019  4:18 PM   Specimen: BLOOD  Result Value Ref Range Status   Specimen Description   Final    BLOOD SITE NOT SPECIFIED Performed at [place] [address], [place], [place] [address]    Special Requests   Final    BOTTLES DRAWN AEROBIC AND ANAEROBIC Blood Culture adequate volume Performed at [place] [address] [place], [place] [address]    Culture   Final    NO GROWTH 5 DAYS Performed at [facility], [place] [address], [place], [place] [address]    Report Status 06/02/2019 FINAL  Final  MRSA PCR Screening     Status: None   Collection Time: 05/27/2019  6:25 PM   Specimen: Nasal Mucosa; Nasopharyngeal  Result Value Ref Range Status   MRSA by PCR NEGATIVE  NEGATIVE Final    Comment:        The GeneXpert MRSA Assay (FDA approved for NASAL specimens only), is one component of a comprehensive MRSA colonization surveillance program. It is not intended to diagnose MRSA infection nor to guide or monitor treatment for MRSA infections. Performed at [facility], [place] [address] [place], [place] [address]   Culture, blood (routine x 2)     Status: None   Collection Time: 05/27/2019  6:47 PM   Specimen: BLOOD  Result Value Ref Range Status   Specimen Description   Final    BLOOD RIGHT ANTECUBITAL Performed at [place] [address], [place], [place] [address]    Special Requests   Final    BOTTLES DRAWN AEROBIC AND ANAEROBIC Blood Culture adequate volume Performed at [place] [address] [place], [place] [address]    Culture   Final    NO GROWTH 5 DAYS Performed at [facility], [place] [address] [place], [place] [address]    Report Status 06/03/2019 FINAL  Final  C Difficile Quick Screen w PCR reflex     Status: None   Collection Time: 05/30/19 10:17 PM   Specimen: STOOL  Result Value Ref Range Status   C Diff antigen NEGATIVE NEGATIVE Final   C Diff toxin NEGATIVE NEGATIVE Final   C Diff interpretation No C.  difficile detected.  Final    Comment: Performed at [facility], [place] [address], [place], [place] [address]      Radiology Studies: US RENAL  Result Date: 06/02/2019 CLINICAL DATA:  Acute kidney injury. EXAM: RENAL / URINARY TRACT ULTRASOUND COMPLETE COMPARISON:  CT abdomen pelvis 12/06/2016 FINDINGS: Right Kidney: Renal measurements: 8.6 x 4.3 x 3.7 cm = volume: 71 mL. Increased echogenicity of the renal cortex. No mass or hydronephrosis. Left Kidney: Renal measurements: 10.2 x 5.1 x 4.3 cm = volume: 116 mL. Increased cortical echogenicity. No obstruction. 4 cm left lower pole renal cyst. Bladder: Appears normal for degree of bladder  distention. Bladder volume 91 cc Other: None. IMPRESSION: Negative for renal obstruction. Relatively small kidneys with increased cortical echogenicity suggesting chronic kidney disease. Electronically Signed   By: Franchot Gallo M.D.   On: 06/02/2019 08:04     LOS: 6 days   Antonieta Pert, MD [facility]  06/03/2019, 2:34 PM

## 2019-06-03 NOTE — Progress Notes (Signed)
Patient transitioning to comfort care will sign off thank you for consult

## 2019-06-03 NOTE — Progress Notes (Signed)
Speech Language Pathology Discharge Patient Details Name: Calvin Mccormick MRN: [id number] DOB: 1945-07-26 Today's Date: 06/03/2019 Time:  -     Patient discharged from SLP services secondary to medical decline - will need to re-order SLP to resume therapy services.  Please see latest therapy progress note for current level of functioning and progress toward goals.    Progress and discharge plan discussed with patient and/or caregiver: Patient unable to participate in discharge planning and no caregivers available  GO    Kathleen Lime, MS [place] Office [phone]  [name] 06/03/2019, 8:56 AM

## 2019-06-04 LAB — VITAMIN B1: Vitamin B1 (Thiamine): 67.2 nmol/L (ref 66.5–200.0)

## 2019-06-04 NOTE — Progress Notes (Signed)
Spoke with Wife and daughter regarding administration of pain medication. Wife Tamela Oddi would like called before any dose is administered...she would like to know the symptoms that her husband is experiencing that would justify the pain medication and she would also like to have input as to the appropriate dose along with a discussion with the nurse. I advised that I would call her anytime I thought it was appropriate for patient to receive a narcotic and I would pass that along to the next shift nurse as well.

## 2019-06-04 NOTE — Progress Notes (Signed)
I stop by to check on the patient He is unresponsive but appears to be breathing comfortably Son by the bedside, asleep I appreciate of the care provided by hospitalist team and palliative care I will sign off

## 2019-06-04 NOTE — Progress Notes (Signed)
Nutrition Brief Note  Patient was assessed remotely by another RD on 5/22. Chart reviewed. Patient has transitioned to comfort care, per MD note on 5/27 afternoon. Palliative Care has been following patient and last saw him/talked with family on 5/27 AM. Palliative Care MD note states anticipated hospital death. Flow sheet documentation indicates that patient is currently unresponsive.   No further nutrition interventions warranted at this time.       Jarome Matin, MS, RD, LDN, CNSC Inpatient Clinical Dietitian RD pager # available in [place]  After hours/weekend pager # available in [facility]

## 2019-06-04 NOTE — Progress Notes (Signed)
Chaplain responded to spiritual consult. Patient actively dying.  Son Zahorik and wife "Doris" (nickname for Earlie Server) were bedside, both having spent the night.  Daughter Tretha Sciara is a CNA.  Family was very concerned for patient's comfort and peace in last hours.  Wife did not want more Fentanyl given that would hasten his passing.  Chaplain offered ministry of presence and prayer.  Chaplain spoke with nurse before leaving unit. This chaplain will be here until 5 PM tonight if more support is needed.  Rev. Tamsen Snider

## 2019-06-04 NOTE — Progress Notes (Signed)
Assessment completed, respirations easy and unlabored at 10/hr, O2 sat is 99%, no secretions present, no gurgling noted, son was concerned because patient had moved his foot a few times, at present patient is having no signs of any muscles twitches, tremors, or spasms. Sister, Tretha Sciara, was called to give update on patient condition and further explain prn medications, she was thankful for the update. Will continue to monitor closely for medication needs.

## 2019-06-04 NOTE — Care Management Important Message (Signed)
Important Message  Patient Details IM Letter given to Case Manager to present to the Patient Name: Calvin Mccormick MRN: [id number] Date of Birth: [birth year]   Medicare Important Message Given:  Yes     Kerin Salen 06/04/2019, 10:03 AM

## 2019-06-04 NOTE — Progress Notes (Signed)
Daily Progress Note   Patient Name: SATORU MILICH       Date: 06/04/2019 DOB: 1945/08/01  Age: 74 y.o. MRN#: [id number] Attending Physician: Antonieta Pert, MD Primary Care Physician: Lucianne Lei, MD Admit Date: 05/25/2019  Reason for Consultation/Follow-up: Establishing goals of care  Subjective:   Patient has been transferred out off stepdown unit onto a nonmonitored floor.  Patient's son as well as her other family members are holding vigil at the bedside.  Patient is unresponsive and has shallow breathing possibly also apneic spells going on.  He has some coolness in his peripheries.  I discussed with family present at the bedside about patient's current condition, patient's end-of-life signs and symptoms as well as his underlying acute and chronic illnesses that have led up to this point.   Length of Stay: 7  Current Medications: Scheduled Meds:  . allopurinol  100 mg Oral BID  . brimonidine  1 drop Right Eye TID  . Chlorhexidine Gluconate Cloth  6 each Topical Daily  . dorzolamide-timolol  1 drop Right Eye BID  . fluorometholone  1 drop Right Eye Daily  . latanoprost  1 drop Right Eye QHS  . levothyroxine  37.5 mcg Intravenous Daily  . mouth rinse  15 mL Mouth Rinse BID  . sodium chloride flush  10-40 mL Intracatheter Q12H  . sodium chloride flush  3 mL Intravenous Q12H    Continuous Infusions: . sodium chloride Stopped (06/03/19 0025)  . famotidine (PEPCID) IV Stopped (06/03/19 1038)  . methocarbamol (ROBAXIN) IV    . sodium bicarbonate 150 mEq in dextrose 5% 1000 mL Stopped (06/03/19 0023)    PRN Meds: sodium chloride, acetaminophen **OR** acetaminophen, albuterol, antiseptic oral rinse, glycopyrrolate **OR** glycopyrrolate **OR** glycopyrrolate, HYDROmorphone (DILAUDID)  injection, methocarbamol (ROBAXIN) IV, ondansetron **OR** ondansetron (ZOFRAN) IV, polyvinyl alcohol, sodium chloride flush, sodium chloride flush  Physical Exam         Appears weak and deconditioned Unresponsive Shallow regular breathing S1-S2 Muscle wasting, no edema Abdomen not distended Some coolness in extremities.   Vital Signs: BP 110/66 (BP Location: Left Arm)   Pulse 64   Temp 97.6 F (36.4 C) (Oral)   Resp 16   Ht '5\' 11"'  (1.803 m)   Wt 56.2 kg   SpO2 (!) 81%   BMI 17.28 kg/m  SpO2:  SpO2: (!) 81 % O2 Device: O2 Device: Room Air O2 Flow Rate:    Intake/output summary:   Intake/Output Summary (Last 24 hours) at 06/04/2019 1240 Last data filed at 06/04/2019 1045 Gross per 24 hour  Intake 49.87 ml  Output 900 ml  Net -850.13 ml   LBM: Last BM Date: 06/03/19 Baseline Weight: Weight: 56.2 kg Most recent weight: Weight: 56.2 kg       Palliative Assessment/Data:      Patient Active Problem List   Diagnosis Date Noted  . Acute combined systolic and diastolic heart failure ([place])   . EKG abnormalities   . Demand ischemia ([place])   . Altered mental status   . Palliative care by specialist   . General weakness   . Palliative care encounter   . Acute encephalopathy 05/21/2019  . Multifocal pneumonia   . Sepsis with acute organ dysfunction without septic shock ([place])   . Hypothyroidism   . Decubitus skin ulcer 04/19/2019  . Community acquired pneumonia 04/18/2019  . CAP (community acquired pneumonia) 04/18/2019  . Hyperkalemia 04/13/2019  . Boil, leg 03/29/2019  . Generalized weakness 03/29/2019  . Drug-induced hyperglycemia 03/23/2019  . Shingles rash 02/15/2019  . Chronic diarrhea 09/28/2018  . Tooth infection 04/13/2018  . Iron deficiency anemia 02/06/2017  . Dry skin dermatitis 02/06/2017  . Esophagitis, acute 12/12/2016  . Acute blood loss anemia 10/18/2016  . Aspiration pneumonia ([place]) 10/16/2016  . AKI (acute kidney injury) ([place]) 10/16/2016  .  Upper GI bleed 10/16/2016  . Gait instability 05/22/2016  . Physical debility 05/22/2016  . Pancytopenia, acquired ([place]) 03/27/2016  . Goals of care, counseling/discussion 02/25/2016  . Port catheter in place 01/15/2016  . Acute neck pain 11/23/2015  . Renal failure 04/08/2015  . Acute renal failure (ARF) ([place]) 04/08/2015  . Acute renal failure superimposed on stage 3 chronic kidney disease ([place]) 04/08/2015  . Nausea and vomiting 04/08/2015  . Hypocalcemia 04/08/2015  . Pancytopenia due to antineoplastic chemotherapy ([place]) 11/15/2014  . 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 Protein calorie malnutrition ([place]) 10/06/2014  . Neuropathy due to chemotherapeutic drug ([place]) 02/21/2014  . Cellulitis and abscess of hand 08/05/2011  . Hypomagnesemia 07/29/2011  . Hypokalemia with normal acid-base balance 07/29/2011  . HTN (hypertension) 07/24/2011  . Anemia in chronic renal disease 07/24/2011  . Dehydration 07/24/2011  . Diarrhea 06/17/2011  . Chronic renal insufficiency, stage III (moderate) 06/17/2011  . Hypokalemia 05/30/2011  . CKD (chronic kidney disease), stage III 02/05/2011  . Multiple myeloma in relapse ([place]) 12/12/2010  . 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 male with a hx of relapsing multiple myeloma was currently on chemotherapy (dexamethasone andSenilexor), CKD 3, chronic pancytopenia, chronic diarrhea, hypertension, and hyperlipidemia.   Assessment:  acute metabolic encephalopathy AKI on III CKD Electrolyte abnormalities Multiple myeloma relapse.  Acute hypotension as well as unresponsiveness overnight on 5-26, goals of care discussed by on-call [facility] team and election of comfort measures was established. Recommendations/Plan:  Continue comfort measures Anticipate hospital death.  Patient is declining rapidly.  Prognosis appears to be limited to some hours to possibly less than 24 to 48 hours  or so.  Discussed about option of residential hospice, family does not want the patient transported to hospice facility.  Prefer patient staying here for now for symptom management at end-of-life care.  In my opinion patient not stable enough for transportation to residential  hospice.  Continue comfort measures here.    Code Status:    Code Status Orders  (From admission, onward)         Start     Ordered   06/06/2019 1806  Do not attempt resuscitation (DNR)  Continuous    Question Answer Comment  In the event of cardiac or respiratory ARREST Do not call a "code blue"   In the event of cardiac or respiratory ARREST Do not perform Intubation, CPR, defibrillation or ACLS   In the event of cardiac or respiratory ARREST Use medication by any route, position, wound care, and other measures to relive pain and suffering. May use oxygen, suction and manual treatment of airway obstruction as needed for comfort.      05/17/2019 1808        Code Status History    Date Active Date Inactive Code Status Order ID Comments User Context   04/19/2019 0415 04/22/2019 2346 Full Code [id number]  Toy Baker, MD ED   10/17/2016 0049 10/18/2016 1555 Full Code [id number]  Toy Baker, MD Inpatient   04/08/2015 0355 04/10/2015 1809 Full Code [id number]  Norval Morton, MD ED   05/27/2014 1138 05/27/2014 1440 Full Code [id number]  Lenn Cal, [place] Inpatient   03/03/2014 1529 03/04/2014 0343 Full Code [id number]  Corrie Mckusick, [place]   05/30/2011 0238 06/02/2011 1730 Full Code [id number]  Claude Manges, RN Inpatient   Advance Care Planning Activity       Prognosis:  Hours to some very limited number of days  Discharge Planning: Anticipated hospital death.   Care plan was discussed with  Patient's son and family at bedside   Thank you for allowing the Palliative Medicine Team to assist in the care of this patient.   Time In: 11 Time Out: 11.25 Total Time 25 Prolonged Time Billed No        Greater than 50%  of this time was spent counseling and coordinating care related to the above assessment and plan.  Loistine Chance, MD  Please contact Palliative Medicine Team phone at [phone] for questions and concerns.

## 2019-06-04 NOTE — Progress Notes (Signed)
Son called out of room to request pain medication for his dad. A full assessment was done at this time. Finding include respirations easy and unlabored at 11/minute, BUE and BLE warm to touch with trace edema, foley with minimal concentrated output, and patient with eyes partially open but appearing in no distress. Pt was orally suctioned at request of the son. Scant secretions were present, no audible gurgling or adventitious lung sounds at this time. Will continue to monitor. Son encouraged to call the writer for any concerns whatsoever. Lights dimmed and patient left to rest comfortably.

## 2019-06-04 NOTE — Progress Notes (Signed)
PROGRESS NOTE    Calvin Mccormick  GYF:[id number] DOB: November 17, 1945 DOA: 05/20/2019 PCP: Lucianne Lei, MD   Chef Complaints:unresponsiveness. Brief Narrative: 74 y.o. male with medical history significant for multiple myeloma in relapse currently on chemotherapy-dexamethasone 16 mg every Tuesday and Thursday and selinexor at 60 mg every Tuesday and Thursday followed by Dr. 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  Patient unable to provide history and presentation.  As per family patient developed significant nausea and vomiting after taking "stomach pills" yesterday afternoon and became less responsive and since then has been intermittently unresponsive sometimes walking up, not been eating well for the past 2 days.  No reported fever, chest pain, nausea, vomiting, fever, chills,, focal weakness, dysuria.  In ED:On presentation patient was minimally responsive, vitals stable saturating well on room air, temperature 97.4, blood work showed hyponatremia, worsening renal failure lactic acid is 4.1, hemoglobin 10.9 g, platelet 44,000.  Patient was given 1800 mL of fluid about to finish his bolus, since starting fluid patient has perked up is much more alert awake oriented knows he is in the hospital. When I saw his wife is at the bedside helping with history.  Last x-ray and CT head no acute findings. Covid screening negative  Patient was admitted with empiric antibiotics and aggressive IV fluid hydration.   BUN/creatinine initial improving, lactic acid improved but again was worsening despite iv hydration. Cultures were negative and antibiotic discontinued patient was having diarrhea work-up negative with significant GI panel.  Overall mental status no significant improvement. Seen  by cardiology due to significant elevated  troponin EKG changes and echo showed acute severely reduced EF with this severe systolic dysfunction, EF 91-63% suspected Takotsubo cardiomyopathy. Patient had acute hypotension and unresponsiveness on monitor 5/26" he had his discussion with the family and family elected for comfort measures.  Subjective: Multiple family nurse at the bedside, patient unresponsive with shallow breathing. Rectal tube/Foley catheter in place  Assessment & Plan:  Acute encephalopathy suspected metabolic/multifactorial in the setting of AKI/deconditioning dehydration, multiple myeloma, chemotherapy and has acute systolic dysfunction too.  He had CT head and MRI brain no acute finding, EEG showed moderate to severe diffuse encephalopathy. B12,TSH ammonia level stable. I had discussed with Dr. Rory Percy from neurology. Pt No obvious infection on UA and chest x-ray blood culture negative and antibiotics were discontinued 5/24 as he was having diarrhea.     AKI on CKD stage IIIa/metabolic acidosis/questionable cardiorenal syndrome : Has baseline CKD from multiple myeloma with creatinine 1.2-1.6, bun IN 10s in 04/2019,slowly worsening recently  1.7-2.2.on admission patient with dehydration AKI uremia. BUN/creatinine was improving however was worsening again 5/26.renal ultrasound and no obstruction, seen by Dr. Cheyenne Adas not to escalate care, and felt poor candidate for CRRT.Creat 1.4>1.1>1.4>1.9>2.0  Acute Severe systolic dysfunction/elevated troponin with diffuse T wave changes and Elavated troponin/echo shows EF 20 to 25% with severe systolic dysfunction-in a pattern consistent with Takotsubo cardiomyopathy as per cardiology:no plan for ischemic evaluation and no reversible etiology identified and and agreed with transition to comfort measures. Patient not treated with heparin due to severe thrombocytopenia.   Pancytopenia/anemia and severe thrombocytopenia:yhis has been worsening, chemotherapy was dose adjusted. Platelet have  been dropping was at 12k 06/02/19. hemoglobin mid 7.4 gm-seen by hem-onc.  Multiple Myeloma in relapse patient was getting  dexamethasone 16 mg every Tuesday and Thursday and selinexor at  60 mg every Tuesday and Thursday followed by Dr. Alvy Bimler.  who felt that patient did not tolerate chemo and felt that patient is not a candidate for any additional chemotherapy agreed with DNR/DNI and ongoing palliative care assistance. Appreciate input.   Hyperkalemia: k improved to 4.5,5/26  Hypomagnesemia- was resolved  Lactic acidosis: Lactate improved after initial IV fluids.  Patient was on bicarb infusion prior ot comfort measures  Hypoglycemia: Patient was getting dextrose with IV fluids bicarb priro to comfort measures  Hypotonic hyponatremia Nausea and vomiting: no recurrence Diarrhea : Unclear etiology could be due to antibioticss.Rectal tube in place, cont for suport.  C. difficile negative.  Severe dehydration due to #1: He was aggressively hydrated. Currently unresponsive Hypothyroidism:Euthyroid TSH 0.6.Continue iv synthroid Goals of care:DNR DNI.  Palliative care is following closely discussed w/ Dr Milton Ferguson who was planning for comfort measures discussion with family 5/27 am and has been transitioned to comfort measures  And remains unresponsive.  Support provided.multiple family members at the bedside   DVT prophylaxis:SCD. Code Status:DNR. Family Communication:I had updated patient's wife and son regularly prior to comfort measures.Again discussed with patient's family at the bedside. All questions answered to the best of my ability.    Status is: Inpatient Remains inpatient appropriate because of patient's overall declining condition.    Dispo: The patient is from:Home              Anticipated d/c is to:TBD              Anticipated d/c date is: 1 days              Patient currently is not medically stable to d/c. Nutrition: Diet Order            DIET - DYS 1 Room service appropriate?  No; Fluid consistency: Thin  Diet effective now             Body mass index is 17.28 kg/m.  Consultants:see note  Procedures:  FUX:NATF study is suggestive of moderate to severe diffuse encephalopathy, nonspecific etiology. No seizures or epileptiform discharges were seen throughout the recording  Microbiology:see note  Medications: Scheduled Meds: . allopurinol  100 mg Oral BID  . brimonidine  1 drop Right Eye TID  . Chlorhexidine Gluconate Cloth  6 each Topical Daily  . dorzolamide-timolol  1 drop Right Eye BID  . fluorometholone  1 drop Right Eye Daily  . latanoprost  1 drop Right Eye QHS  . levothyroxine  37.5 mcg Intravenous Daily  . mouth rinse  15 mL Mouth Rinse BID  . sodium chloride flush  10-40 mL Intracatheter Q12H  . sodium chloride flush  3 mL Intravenous Q12H   Continuous Infusions: . sodium chloride Stopped (06/03/19 0025)  . famotidine (PEPCID) IV Stopped (06/03/19 1038)  . methocarbamol (ROBAXIN) IV    . sodium bicarbonate 150 mEq in dextrose 5% 1000 mL Stopped (06/03/19 0023)   Antimicrobials: Anti-infectives (From admission, onward)   Start     Dose/Rate Route Frequency Ordered Stop   05/29/19 1600  ceFEPIme (MAXIPIME) 2 g in sodium chloride 0.9 % 100 mL IVPB  Status:  Discontinued     2 g 200 mL/hr over 30 Minutes Intravenous Every 24 hours 06/06/2019 1916 05/31/19 0754   05/20/2019 1630  ceFEPIme (MAXIPIME) 2 g in sodium chloride 0.9 % 100 mL IVPB     2 g 200 mL/hr over 30 Minutes Intravenous  Once 05/19/2019 1619 05/17/2019 1803     Objective:  Vitals: Today's Vitals   06/04/19 0223 06/04/19 0900 06/04/19 1102 06/04/19 1301  BP:      Pulse:      Resp:      Temp:      TempSrc:      SpO2:      Weight:      Height:      PainSc: Asleep Asleep Asleep Asleep    Intake/Output Summary (Last 24 hours) at 06/04/2019 1346 Last data filed at 06/04/2019 1045 Gross per 24 hour  Intake 49.87 ml  Output 900 ml  Net -850.13 ml   Filed Weights   05/20/2019  1831  Weight: 56.2 kg   Weight change:    Intake/Output from previous day: 05/27 0701 - 05/28 0700 In: 49.9 [IV Piggyback:49.9] Out: 900 [Urine:900] Intake/Output this shift: No intake/output data recorded.  Examination:  General exam: unresponsive shallow breathing HEENT:Oral mucosa dry, Ear/Nose WNL grossly, dentition normal. Respiratory system: bilaterally diminished, shallow breathing Cardiovascular system: S1 & S2 +, No JVD,. Gastrointestinal system: Abdomen soft Nervous System: Unresponsive, breathing spontaneously  Extremities: No edema, distal peripheral pulses palpable.  Skin: No rashes,no icterus. MSK: Normal muscle bulk,tone, power  Data Reviewed: I have personally reviewed following labs and imaging studies CBC: Recent Labs  Lab 05/10/2019 1531 05/16/2019 1554 05/29/19 0248 05/30/19 0939 05/31/19 0601 06/01/19 0251 06/02/19 0246  WBC 8.2   < > 8.8 8.6 6.7 7.5 5.2  NEUTROABS 7.7  --   --   --   --   --   --   HGB 11.2*   < > 8.7* 7.7* 7.5* 9.1* 7.4*  HCT 31.7*   < > 26.1* 23.0* 22.6* 27.4* 22.1*  MCV 87.3   < > 89.4 90.6 89.7 89.5 88.8  PLT 44*   < > 34* 24* 19* 18* 12*   < > = values in this interval not displayed.   Basic Metabolic Panel: Recent Labs  Lab 05/30/19 0939 05/30/19 1114 05/30/19 2114 05/31/19 0601 05/31/19 1438 06/01/19 0251 06/02/19 0246 06/02/19 0739  NA 140  --   --  140  --  137 133* 134*  K 2.4*  --    < > 2.5* 3.4* 4.2 5.8* 4.5  CL 113*  --   --  112*  --  112* 111 110  CO2 22  --   --  21*  --  16* 15* 16*  GLUCOSE 149*  --   --  133*  --  100* 84 72  BUN 24*  --   --  17  --  20 32* 33*  CREATININE 1.41*  --   --  1.17  --  1.40* 1.99* 2.01*  CALCIUM 7.3*  --   --  7.5*  --  8.8* 8.8* 9.0  MG  --  1.5*  --   --   --   --  1.8  --    < > = values in this interval not displayed.   GFR: Estimated Creatinine Clearance: 24.9 mL/min (A) (by C-G formula based on SCr of 2.01 mg/dL (H)). Liver Function Tests: Recent Labs  Lab  05/30/2019 1531 05/30/19 0939 05/31/19 0601 06/01/19 0251 06/02/19 0246  AST '15 19 18 18 ' 41  ALT '24 25 28 27 ' 48*  ALKPHOS 57 49 55 63 60  BILITOT 1.2 0.7 0.7 0.8 0.8  PROT 6.7 5.2* 5.1* 6.0* 5.0*  ALBUMIN 3.6 2.5* 2.4* 2.9* 2.6*   Recent Labs  Lab 05/23/2019 1531  LIPASE 35  Recent Labs  Lab 06/07/2019 1531  AMMONIA 14   Coagulation Profile: Recent Labs  Lab 05/15/2019 1531  INR 1.2   Cardiac Enzymes: Recent Labs  Lab 06/02/19 0739  CKTOTAL 117   BNP (last 3 results) No results for input(s): PROBNP in the last 8760 hours. HbA1C: No results for input(s): HGBA1C in the last 72 hours. CBG: Recent Labs  Lab 05/30/19 0011 06/02/19 0832 06/02/19 0859 06/02/19 1246 06/02/19 1557  GLUCAP 128* 63* 149* 81 80   Lipid Profile: No results for input(s): CHOL, HDL, LDLCALC, TRIG, CHOLHDL, LDLDIRECT in the last 72 hours. Thyroid Function Tests: No results for input(s): TSH, T4TOTAL, FREET4, T3FREE, THYROIDAB in the last 72 hours. Anemia Panel: No results for input(s): VITAMINB12, FOLATE, FERRITIN, TIBC, IRON, RETICCTPCT in the last 72 hours. Sepsis Labs: Recent Labs  Lab 06/03/2019 1531 05/12/2019 1727 05/21/2019 2300  LATICACIDVEN 4.1* 3.1* 2.3*    Recent Results (from the past 240 hour(s))  Urine culture     Status: None   Collection Time: 05/11/2019  3:27 PM   Specimen: Urine, Clean Catch  Result Value Ref Range Status   Specimen Description   Final    URINE, CLEAN CATCH Performed at [facility], [place] [address], [place], [place] [address]    Special Requests   Final    NONE Performed at [facility], [place] [address] [place], [place] [address]    Culture   Final    NO GROWTH Performed at [facility], [place] [address] [place], [place] [address]    Report Status 05/30/2019 FINAL  Final  SARS Coronavirus 2 by RT PCR (hospital order, performed in [facility] hospital lab) Nasopharyngeal Nasopharyngeal Swab     Status: None    Collection Time: 06/03/2019  4:12 PM   Specimen: Nasopharyngeal Swab  Result Value Ref Range Status   SARS Coronavirus 2 NEGATIVE NEGATIVE Final    Comment: (NOTE) SARS-CoV-2 target nucleic acids are NOT DETECTED. The SARS-CoV-2 RNA is generally detectable in upper and lower respiratory specimens during the acute phase of infection. The lowest concentration of SARS-CoV-2 viral copies this assay can detect is 250 copies / mL. A negative result does not preclude SARS-CoV-2 infection and should not be used as the sole basis for treatment or other patient management decisions.  A negative result may occur with improper specimen collection / handling, submission of specimen other than nasopharyngeal swab, presence of viral mutation(s) within the areas targeted by this assay, and inadequate number of viral copies (<250 copies / mL). A negative result must be combined with clinical observations, patient history, and epidemiological information. Fact Sheet for Patients:   StrictlyIdeas.no Fact Sheet for Healthcare Providers: BankingDealers.co.za This test is not yet approved or cleared  by the [place] FDA and has been authorized for detection and/or diagnosis of SARS-CoV-2 by FDA under an Emergency Use Authorization (EUA).  This EUA will remain in effect (meaning this test can be used) for the duration of the COVID-19 declaration under Section 564(b)(1) of the Act, 21 U.S.C. section 360bbb-3(b)(1), unless the authorization is terminated or revoked sooner. Performed at [facility], [place] [address], [place], [place] [address]   Culture, blood (routine x 2)     Status: None   Collection Time: 05/14/2019  4:18 PM   Specimen: BLOOD  Result Value Ref Range Status   Specimen Description   Final    BLOOD SITE NOT SPECIFIED Performed at [facility], [place] [address], [place], [place] [address]    Special  Requests   Final    BOTTLES DRAWN AEROBIC AND ANAEROBIC Blood Culture adequate volume Performed at [place] [address], [place], [place] [address]    Culture   Final    NO GROWTH 5 DAYS Performed at [facility], [place] [address] [place], [place] [address]    Report Status 06/02/2019 FINAL  Final  MRSA PCR Screening     Status: None   Collection Time: 05/30/2019  6:25 PM   Specimen: Nasal Mucosa; Nasopharyngeal  Result Value Ref Range Status   MRSA by PCR NEGATIVE NEGATIVE Final    Comment:        The GeneXpert MRSA Assay (FDA approved for NASAL specimens only), is one component of a comprehensive MRSA colonization surveillance program. It is not intended to diagnose MRSA infection nor to guide or monitor treatment for MRSA infections. Performed at [facility], [place] [address] [place], [place] [address]   Culture, blood (routine x 2)     Status: None   Collection Time: 05/18/2019  6:47 PM   Specimen: BLOOD  Result Value Ref Range Status   Specimen Description   Final    BLOOD RIGHT ANTECUBITAL Performed at [place] [address] [place], [place] [address]    Special Requests   Final    BOTTLES DRAWN AEROBIC AND ANAEROBIC Blood Culture adequate volume Performed at [place] [address], [place], [place] [address]    Culture   Final    NO GROWTH 5 DAYS Performed at [facility], [place] [address] [place], [place] [address]    Report Status 06/03/2019 FINAL  Final  C Difficile Quick Screen w PCR reflex     Status: None   Collection Time: 05/30/19 10:17 PM   Specimen: STOOL  Result Value Ref Range Status   C Diff antigen NEGATIVE NEGATIVE Final   C Diff toxin NEGATIVE NEGATIVE Final   C Diff interpretation No C. difficile detected.  Final    Comment: Performed at [facility], [place] [address], [place], [place] [address]      Radiology  Studies: No results found.   LOS: 7 days   Antonieta Pert, MD [facility]  06/04/2019, 1:46 PM

## 2019-06-05 NOTE — Progress Notes (Signed)
Daily Progress Note   Patient Name: Calvin Mccormick       Date: 06/05/2019 DOB: [birth year]  Age: 74 y.o. MRN#: [id number] Attending Physician: Georgette Shell, MD Primary Care Physician: Lucianne Lei, MD Admit Date: 05/26/2019  Reason for Consultation/Follow-up: Establishing goals of care  Subjective:   Patient continues in the dying process, not awake not alert, unresponsive. Some coarse breath sounds, some gurgling today, apnea also evident. Son at bedside. We discussed about his current state and need for judicious needs for opioids namely IV Dilaudid PRN. He states that first priority is to keep patient comfortable and that he understands patient is at end of life with limited prognosis.    Length of Stay: 8  Current Medications: Scheduled Meds:  . allopurinol  100 mg Oral BID  . brimonidine  1 drop Right Eye TID  . Chlorhexidine Gluconate Cloth  6 each Topical Daily  . dorzolamide-timolol  1 drop Right Eye BID  . fluorometholone  1 drop Right Eye Daily  . latanoprost  1 drop Right Eye QHS  . levothyroxine  37.5 mcg Intravenous Daily  . mouth rinse  15 mL Mouth Rinse BID  . sodium chloride flush  10-40 mL Intracatheter Q12H  . sodium chloride flush  3 mL Intravenous Q12H    Continuous Infusions: . sodium chloride Stopped (06/03/19 0025)  . famotidine (PEPCID) IV 20 mg (06/05/19 0939)  . methocarbamol (ROBAXIN) IV    . sodium bicarbonate 150 mEq in dextrose 5% 1000 mL Stopped (06/03/19 0023)    PRN Meds: sodium chloride, acetaminophen **OR** acetaminophen, albuterol, antiseptic oral rinse, glycopyrrolate **OR** glycopyrrolate **OR** glycopyrrolate, HYDROmorphone (DILAUDID) injection, methocarbamol (ROBAXIN) IV, ondansetron **OR** ondansetron (ZOFRAN) IV, polyvinyl alcohol,  sodium chloride flush, sodium chloride flush  Physical Exam         Appears weak and deconditioned Unresponsive Shallow regular breathing, some apneic spells.  S1-S2 Muscle wasting, no edema Abdomen not distended Some coolness in extremities.   Vital Signs: BP (!) 142/68 (BP Location: Left Arm)   Pulse 82   Temp 97.9 F (36.6 C)   Resp (!) 9   Ht '5\' 11"'  (1.803 m)   Wt 56.2 kg   SpO2 100%   BMI 17.28 kg/m  SpO2: SpO2: 100 % O2 Device: O2 Device: Nasal Cannula O2 Flow Rate: O2  Flow Rate (L/min): 1 L/min  Intake/output summary:   Intake/Output Summary (Last 24 hours) at 06/05/2019 1036 Last data filed at 06/05/2019 7322 Gross per 24 hour  Intake 0 ml  Output 300 ml  Net -300 ml   LBM: Last BM Date: 06/03/19 Baseline Weight: Weight: 56.2 kg Most recent weight: Weight: 56.2 kg       Palliative Assessment/Data:      Patient Active Problem List   Diagnosis Date Noted  . Acute combined systolic and diastolic heart failure ([place])   . EKG abnormalities   . Demand ischemia ([place])   . Altered mental status   . Palliative care by specialist   . General weakness   . Palliative care encounter   . Acute encephalopathy 05/12/2019  . Multifocal pneumonia   . Sepsis with acute organ dysfunction without septic shock ([place])   . Hypothyroidism   . Decubitus skin ulcer 04/19/2019  . Community acquired pneumonia 04/18/2019  . CAP (community acquired pneumonia) 04/18/2019  . Hyperkalemia 04/13/2019  . Boil, leg 03/29/2019  . Generalized weakness 03/29/2019  . Drug-induced hyperglycemia 03/23/2019  . Shingles rash 02/15/2019  . Chronic diarrhea 09/28/2018  . Tooth infection 04/13/2018  . Iron deficiency anemia 02/06/2017  . Dry skin dermatitis 02/06/2017  . Esophagitis, acute 12/12/2016  . Acute blood loss anemia 10/18/2016  . Aspiration pneumonia ([place]) 10/16/2016  . AKI (acute kidney injury) ([place]) 10/16/2016  . Upper GI bleed 10/16/2016  . Gait instability 05/22/2016  .  Physical debility 05/22/2016  . Pancytopenia, acquired ([place]) 03/27/2016  . Goals of care, counseling/discussion 02/25/2016  . Port catheter in place 01/15/2016  . Acute neck pain 11/23/2015  . Renal failure 04/08/2015  . Acute renal failure (ARF) ([employer]) 04/08/2015  . Acute renal failure superimposed on stage 3 chronic kidney disease ([place]) 04/08/2015  . Nausea and vomiting 04/08/2015  . Hypocalcemia 04/08/2015  . Pancytopenia due to antineoplastic chemotherapy ([place]) 11/15/2014  . 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 Protein calorie malnutrition ([place]) 10/06/2014  . Neuropathy due to chemotherapeutic drug ([place]) 02/21/2014  . Cellulitis and abscess of hand 08/05/2011  . Hypomagnesemia 07/29/2011  . Hypokalemia with normal acid-base balance 07/29/2011  . HTN (hypertension) 07/24/2011  . Anemia in chronic renal disease 07/24/2011  . Dehydration 07/24/2011  . Diarrhea 06/17/2011  . Chronic renal insufficiency, stage III (moderate) 06/17/2011  . Hypokalemia 05/30/2011  . CKD (chronic kidney disease), stage III 02/05/2011  . Multiple myeloma in relapse ([place]) 12/12/2010  . 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 male with a hx of relapsing multiple myeloma was currently on chemotherapy (dexamethasone andSenilexor), CKD 3, chronic pancytopenia, chronic diarrhea, hypertension, and hyperlipidemia.   Assessment:  acute metabolic encephalopathy AKI on III CKD Electrolyte abnormalities Multiple myeloma relapse.  Acute hypotension as well as unresponsiveness overnight on 5-26, goals of care discussed by on-call [facility] team and election of comfort measures was established. Recommendations/Plan:  Continue comfort measures Anticipate hospital death.  Prognosis appears to be limited to some hours to possibly less than 24 to 48 hours or so.    Continue to support patient and family as a unit, end of life signs and symptoms  and judicious use of opioids was reviewed again with son at bedside.  Overall goals are for comfort care.   Code Status:    Code Status Orders  (From admission, onward)  Start     Ordered   05/11/2019 1806  Do not attempt resuscitation (DNR)  Continuous    Question Answer Comment  In the event of cardiac or respiratory ARREST Do not call a "code blue"   In the event of cardiac or respiratory ARREST Do not perform Intubation, CPR, defibrillation or ACLS   In the event of cardiac or respiratory ARREST Use medication by any route, position, wound care, and other measures to relive pain and suffering. May use oxygen, suction and manual treatment of airway obstruction as needed for comfort.      05/31/2019 1808        Code Status History    Date Active Date Inactive Code Status Order ID Comments User Context   04/19/2019 0415 04/22/2019 2346 Full Code [id number]  Toy Baker, MD ED   10/17/2016 0049 10/18/2016 1555 Full Code [id number]  Toy Baker, MD Inpatient   04/08/2015 0355 04/10/2015 1809 Full Code [id number]  Norval Morton, MD ED   05/27/2014 1138 05/27/2014 1440 Full Code [id number]  Lenn Cal, [place] Inpatient   03/03/2014 1529 03/04/2014 0343 Full Code [id number]  Corrie Mckusick, [place]   05/30/2011 0238 06/02/2011 1730 Full Code [id number]  Claude Manges, RN Inpatient   Advance Care Planning Activity       Prognosis:  Hours to some very limited number of days  Discharge Planning: Anticipated hospital death.   Care plan was discussed with  Patient's son at bedside, and [facility] MD.   Thank you for allowing the Palliative Medicine Team to assist in the care of this patient.   Time In: 10 Time Out: 10.25 Total Time 25 Prolonged Time Billed No       Greater than 50%  of this time was spent counseling and coordinating care related to the above assessment and plan.  Loistine Chance, MD  Please contact Palliative Medicine Team phone at [phone] for  questions and concerns.

## 2019-06-05 NOTE — Progress Notes (Signed)
PROGRESS NOTE    Calvin Mccormick  OIB:[id number] DOB: 1945/09/14 DOA: 05/25/2019 PCP: Lucianne Lei, MD    Brief Narrative: 74 y.o.malewith medical history significant formultiple myeloma in relapse currently on chemotherapy-dexamethasone 16 mg every Tuesday and Thursday and selinexor at 60 mg every Tuesday and Thursday followed by Dr. Alvy Bimler, CKD stage IIIa from [place] creatinine 1.2-1.6, bun IN 10s in 04/2019, slowly worsening recently 1.7-2.2, anemia/pancytopenia,chronic diarrhea,hypertension, hyperlipidemia brought to the ED with unresponsiveness. Patient unable to provide history and presentation. As per family patient developed significant nausea and vomiting after taking "stomach pills" yesterday afternoon andbecame less responsive and since then has been intermittently unresponsive sometimes walking up,not been eating well for the past 2 days. No reported fever, chest pain, nausea, vomiting, fever, chills,, focal weakness, dysuria.  In ED:On presentation patient was minimally responsive, vitals stable saturating well on room air, temperature 97.4,blood work showed hyponatremia, worsening renal failure lactic acid is 4.1, hemoglobin 10.9 g,platelet 44,000. Patient was given 1800 mL of fluid about to finish his bolus, since starting fluid patient has perked up is much more alert awake oriented knows he is in the hospital. When I saw his wife is at the bedside helping with history. Last x-ray and CT head no acute findings. Covid screening negative  Patient was admitted with empiric antibiotics and aggressive IV fluid hydration.   BUN/creatinine initial improving, lactic acid improved but again was worsening despite iv hydration. Cultures were negative and antibiotic discontinued patient was having diarrhea work-up negative with significant GI panel.  Overall mental status no significant improvement. Seen  by cardiology due to significant elevated troponin EKG changes and echo  showed acute severely reduced EF with this severe systolic dysfunction, EF 94-50% suspected Takotsubo cardiomyopathy. Patient had acute hypotension and unresponsiveness on monitor 5/26" he had his discussion with the family and family elected for comfort measures. Assessment & Plan:   Active Problems:   Dehydration   Acute encephalopathy   Palliative care encounter   EKG abnormalities   Demand ischemia (HCC)   Altered mental status   Palliative care by specialist   General weakness   Acute combined systolic and diastolic heart failure (HCC)   Acute encephalopathy suspected metabolic/multifactorial in the setting of AKI/deconditioning dehydration, multiple myeloma, chemotherapy and has acute systolic dysfunction too.  He had CT head and MRI brain no acute finding, EEG showed moderate to severe diffuse encephalopathy. B12,TSH ammonia level stable. I had discussed with Dr. Rory Percy from neurology. Pt No obvious infection on UA and chest x-ray blood culture negative and antibiotics were discontinued 5/24 as he was having diarrhea.     AKI on CKD stage IIIa/metabolic acidosis/questionable cardiorenal syndrome : Has baseline CKD from multiple myeloma with creatinine 1.2-1.6, bun IN 10s in 04/2019,slowly worsening recently 1.7-2.2.on admission patient with dehydration AKI uremia. BUN/creatinine was improving however was worsening again 5/26.renal ultrasound and no obstruction, seen by Dr. Cheyenne Adas not to escalate care, and felt poor candidate for CRRT.Creat 1.4>1.1>1.4>1.9>2.0  Acute Severe systolic dysfunction/elevated troponin with diffuse T wave changes and Elavated troponin/echo shows EF 20 to 25% with severe systolic dysfunction-in a pattern consistent with Takotsubo cardiomyopathy as per cardiology:no plan for ischemic evaluation and no reversible etiology identified and and agreed with transition to comfort measures. Patient not treated with heparin due to severe thrombocytopenia.    Pancytopenia/anemia and severe thrombocytopenia:yhis has been worsening, chemotherapy was dose adjusted. Platelet have been dropping was at 12k 06/02/19. hemoglobin mid 7.4 gm-seen by hem-onc.  Multiple Myeloma in relapse  patient was getting  dexamethasone 16 mg every Tuesday and Thursday and selinexor at 60 mg every Tuesday and Thursday followed by Dr. Alvy Bimler.  who felt that patient did not tolerate chemo and felt that patient is not a candidate for any additional chemotherapy agreed with DNR/DNI and ongoing palliative care assistance. Appreciate input.   Hyperkalemia: k improved to 4.5,5/26  Hypomagnesemia- was resolved  Lactic acidosis: Lactate improved after initial IV fluids.  Patient was on bicarb infusion prior ot comfort measures  Hypoglycemia: Patient was getting dextrose with IV fluids bicarb priro to comfort measures  Hypotonic hyponatremia Nausea and vomiting: no recurrence Diarrhea : Unclear etiology could be due to antibioticss.Rectal tube in place, cont for suport.  C. difficile negative.  Severe dehydration due to #1: He was aggressively hydrated. Currently unresponsive Hypothyroidism:Euthyroid TSH 0.6.Continue iv synthroid Goals of care:DNR DNI.  Palliative care is following closely discussed w/ Dr Milton Ferguson who was planning for comfort measures discussion with family 5/27 am and has been transitioned to comfort measures  And remains unresponsive.  Support provided.multiple family members at the bedside  Pressure Injury 04/19/19 Vertebral column Mid Stage 2 -  Partial thickness loss of dermis presenting as a shallow open injury with a red, pink wound bed without slough. (Active)  04/19/19 0430  Location: Vertebral column  Location Orientation: Mid  Staging: Stage 2 -  Partial thickness loss of dermis presenting as a shallow open injury with a red, pink wound bed without slough.  Wound Description (Comments):   Present on Admission: Yes      Nutrition Problem:  Inadequate oral intake Etiology: lethargy/confusion     Signs/Symptoms: NPO status    Interventions: Refer to RD note for recommendations  Estimated body mass index is 17.28 kg/m as calculated from the following:   Height as of this encounter: '5\' 11"'  (1.803 m).   Weight as of this encounter: 56.2 kg.  DVT prophylaxis: scd Code Status:dnr Family Communication:dw son Disposition Plan:  Status is: Inpatient  Dispo: The patient is from: home              Anticipated d/c is to: expecting hospital death              Anticipated d/c date is unknown              Patient currently is not medically stable to d/c.  Expecting hospital death  Consultants:  Palliative care, nephrology, oncology  Procedures: EEG Antimicrobials: Anti-infectives (From admission, onward)   Start     Dose/Rate Route Frequency Ordered Stop   05/29/19 1600  ceFEPIme (MAXIPIME) 2 g in sodium chloride 0.9 % 100 mL IVPB  Status:  Discontinued     2 g 200 mL/hr over 30 Minutes Intravenous Every 24 hours 06/03/2019 1916 05/31/19 0754   05/08/2019 1630  ceFEPIme (MAXIPIME) 2 g in sodium chloride 0.9 % 100 mL IVPB     2 g 200 mL/hr over 30 Minutes Intravenous  Once 06/04/2019 1619 06/06/2019 1803      Subjective:  Son by the bedside patient appears comfortable no events overnight patient unresponsive not awake Objective: Vitals:   06/03/19 1400 06/03/19 1600 06/03/19 2112 06/05/19 0830  BP: 115/61 125/60 110/66 (!) 142/68  Pulse: 61 71 64 82  Resp: '17 13 16 ' (!) 9  Temp:   97.6 F (36.4 C) 97.9 F (36.6 C)  TempSrc:   Oral   SpO2: 100% 100% (!) 81% 100%  Weight:      Height:  Intake/Output Summary (Last 24 hours) at 06/05/2019 1041 Last data filed at 06/05/2019 0842 Gross per 24 hour  Intake 0 ml  Output 300 ml  Net -300 ml   Filed Weights   06/06/2019 1831  Weight: 56.2 kg    Examination:  General exam: Appears calm and comfortable  Respiratory system: Coarse breath sounds to auscultation.  Respiratory effort normal. Cardiovascular system: S1 & S2 heard, RRR. No JVD, murmurs, rubs, gallops or clicks. No pedal edema. Gastrointestinal system: Abdomen is nondistended, soft and nontender. No organomegaly or masses felt. Normal bowel sounds heard. Central nervous system: Alert and oriented. No focal neurological deficits. Extremities: Symmetric 5 x 5 power. Skin: No rashes, lesions or ulcers Psychiatry: Judgement and insight appear normal. Mood & affect appropriate.     Data Reviewed: I have personally reviewed following labs and imaging studies  CBC: Recent Labs  Lab 05/30/19 0939 05/31/19 0601 06/01/19 0251 06/02/19 0246  WBC 8.6 6.7 7.5 5.2  HGB 7.7* 7.5* 9.1* 7.4*  HCT 23.0* 22.6* 27.4* 22.1*  MCV 90.6 89.7 89.5 88.8  PLT 24* 19* 18* 12*   Basic Metabolic Panel: Recent Labs  Lab 05/30/19 0939 05/30/19 1114 05/30/19 2114 05/31/19 0601 05/31/19 1438 06/01/19 0251 06/02/19 0246 06/02/19 0739  NA 140  --   --  140  --  137 133* 134*  K 2.4*  --    < > 2.5* 3.4* 4.2 5.8* 4.5  CL 113*  --   --  112*  --  112* 111 110  CO2 22  --   --  21*  --  16* 15* 16*  GLUCOSE 149*  --   --  133*  --  100* 84 72  BUN 24*  --   --  17  --  20 32* 33*  CREATININE 1.41*  --   --  1.17  --  1.40* 1.99* 2.01*  CALCIUM 7.3*  --   --  7.5*  --  8.8* 8.8* 9.0  MG  --  1.5*  --   --   --   --  1.8  --    < > = values in this interval not displayed.   GFR: Estimated Creatinine Clearance: 24.9 mL/min (A) (by C-G formula based on SCr of 2.01 mg/dL (H)). Liver Function Tests: Recent Labs  Lab 05/30/19 0939 05/31/19 0601 06/01/19 0251 06/02/19 0246  AST '19 18 18 ' 41  ALT '25 28 27 ' 48*  ALKPHOS 49 55 63 60  BILITOT 0.7 0.7 0.8 0.8  PROT 5.2* 5.1* 6.0* 5.0*  ALBUMIN 2.5* 2.4* 2.9* 2.6*   No results for input(s): LIPASE, AMYLASE in the last 168 hours. No results for input(s): AMMONIA in the last 168 hours. Coagulation Profile: No results for input(s): INR, PROTIME in the last  168 hours. Cardiac Enzymes: Recent Labs  Lab 06/02/19 0739  CKTOTAL 117   BNP (last 3 results) No results for input(s): PROBNP in the last 8760 hours. HbA1C: No results for input(s): HGBA1C in the last 72 hours. CBG: Recent Labs  Lab 05/30/19 0011 06/02/19 0832 06/02/19 0859 06/02/19 1246 06/02/19 1557  GLUCAP 128* 63* 149* 81 80   Lipid Profile: No results for input(s): CHOL, HDL, LDLCALC, TRIG, CHOLHDL, LDLDIRECT in the last 72 hours. Thyroid Function Tests: No results for input(s): TSH, T4TOTAL, FREET4, T3FREE, THYROIDAB in the last 72 hours. Anemia Panel: No results for input(s): VITAMINB12, FOLATE, FERRITIN, TIBC, IRON, RETICCTPCT in the last 72 hours. Sepsis Labs: No results for  input(s): PROCALCITON, LATICACIDVEN in the last 168 hours.  Recent Results (from the past 240 hour(s))  Urine culture     Status: None   Collection Time: 05/22/2019  3:27 PM   Specimen: Urine, Clean Catch  Result Value Ref Range Status   Specimen Description   Final    URINE, CLEAN CATCH Performed at [facility], [place] [address] [place], [place] [address]    Special Requests   Final    NONE Performed at [facility], [place] [address] [place], [place] [address]    Culture   Final    NO GROWTH Performed at [facility], [place] [address], [place], [place] [address]    Report Status 05/30/2019 FINAL  Final  SARS Coronavirus 2 by RT PCR (hospital order, performed in [facility] hospital lab) Nasopharyngeal Nasopharyngeal Swab     Status: None   Collection Time: 05/12/2019  4:12 PM   Specimen: Nasopharyngeal Swab  Result Value Ref Range Status   SARS Coronavirus 2 NEGATIVE NEGATIVE Final    Comment: (NOTE) SARS-CoV-2 target nucleic acids are NOT DETECTED. The SARS-CoV-2 RNA is generally detectable in upper and lower respiratory specimens during the acute phase of infection. The lowest concentration of SARS-CoV-2 viral copies this assay can detect  is 250 copies / mL. A negative result does not preclude SARS-CoV-2 infection and should not be used as the sole basis for treatment or other patient management decisions.  A negative result may occur with improper specimen collection / handling, submission of specimen other than nasopharyngeal swab, presence of viral mutation(s) within the areas targeted by this assay, and inadequate number of viral copies (<250 copies / mL). A negative result must be combined with clinical observations, patient history, and epidemiological information. Fact Sheet for Patients:   StrictlyIdeas.no Fact Sheet for Healthcare Providers: BankingDealers.co.za This test is not yet approved or cleared  by the [place] FDA and has been authorized for detection and/or diagnosis of SARS-CoV-2 by FDA under an Emergency Use Authorization (EUA).  This EUA will remain in effect (meaning this test can be used) for the duration of the COVID-19 declaration under Section 564(b)(1) of the Act, 21 U.S.C. section 360bbb-3(b)(1), unless the authorization is terminated or revoked sooner. Performed at [facility], [place] [address], [place], [place] [address]   Culture, blood (routine x 2)     Status: None   Collection Time: 05/31/2019  4:18 PM   Specimen: BLOOD  Result Value Ref Range Status   Specimen Description   Final    BLOOD SITE NOT SPECIFIED Performed at [place] [address] [place], [place] [address]    Special Requests   Final    BOTTLES DRAWN AEROBIC AND ANAEROBIC Blood Culture adequate volume Performed at [place] [address] [place], [place] [address]    Culture   Final    NO GROWTH 5 DAYS Performed at [facility], [place] [address] [place], [place] [address]    Report Status 06/02/2019 FINAL  Final  MRSA PCR Screening     Status: None   Collection Time: 06/06/2019  6:25 PM    Specimen: Nasal Mucosa; Nasopharyngeal  Result Value Ref Range Status   MRSA by PCR NEGATIVE NEGATIVE Final    Comment:        The GeneXpert MRSA Assay (FDA approved for NASAL specimens only), is one component of a comprehensive MRSA colonization surveillance program. It  is not intended to diagnose MRSA infection nor to guide or monitor treatment for MRSA infections. Performed at [facility], [place] [address] [place], [place] [address]   Culture, blood (routine x 2)     Status: None   Collection Time: 05/17/2019  6:47 PM   Specimen: BLOOD  Result Value Ref Range Status   Specimen Description   Final    BLOOD RIGHT ANTECUBITAL Performed at [place] [address] [place], [place] [address]    Special Requests   Final    BOTTLES DRAWN AEROBIC AND ANAEROBIC Blood Culture adequate volume Performed at [place] [address] [place], [place] [address]    Culture   Final    NO GROWTH 5 DAYS Performed at [facility], [place] [address], [place], [place] [address]    Report Status 06/03/2019 FINAL  Final  C Difficile Quick Screen w PCR reflex     Status: None   Collection Time: 05/30/19 10:17 PM   Specimen: STOOL  Result Value Ref Range Status   C Diff antigen NEGATIVE NEGATIVE Final   C Diff toxin NEGATIVE NEGATIVE Final   C Diff interpretation No C. difficile detected.  Final    Comment: Performed at [facility], [place] [address], [place], [place] [address]         Radiology Studies: No results found.      Scheduled Meds: . allopurinol  100 mg Oral BID  . brimonidine  1 drop Right Eye TID  . Chlorhexidine Gluconate Cloth  6 each Topical Daily  . dorzolamide-timolol  1 drop Right Eye BID  . fluorometholone  1 drop Right Eye Daily  . latanoprost  1 drop Right Eye QHS  . levothyroxine  37.5 mcg Intravenous Daily  . mouth rinse  15 mL Mouth Rinse BID  . sodium chloride flush   10-40 mL Intracatheter Q12H  . sodium chloride flush  3 mL Intravenous Q12H   Continuous Infusions: . sodium chloride Stopped (06/03/19 0025)  . famotidine (PEPCID) IV 20 mg (06/05/19 0939)  . methocarbamol (ROBAXIN) IV    . sodium bicarbonate 150 mEq in dextrose 5% 1000 mL Stopped (06/03/19 0023)     LOS: 8 days     Georgette Shell, MD  06/05/2019, 10:41 AM

## 2019-06-06 MED ORDER — FAMOTIDINE IN NACL 20-0.9 MG/50ML-% IV SOLN
20.0000 mg | Freq: Every day | INTRAVENOUS | Status: DC
  Administered 2019-06-06 – 2019-06-08 (×3): 20 mg via INTRAVENOUS
  Filled 2019-06-06 (×3): qty 50

## 2019-06-06 NOTE — Progress Notes (Signed)
Daily Progress Note   Patient Name: Calvin Mccormick       Date: 06/06/2019 DOB: May 20, 1945  Age: 74 y.o. MRN#: [id number] Attending Physician: Georgette Shell, MD Primary Care Physician: Lucianne Lei, MD Admit Date: 05/17/2019  Reason for Consultation/Follow-up: Non pain symptom management, Pain control and Psychosocial/spiritual support     Subjective: Patient non-verbal.  Son "Heitor" is at bedside.  He asks several questions.  Patient will have short episodes where he moves his stomach and upper body.  Tylon says it happens about 1ce every 6 hours and usually lasts for less than 10 seconds.  When I witness an episode it looked as though patient was trying to cough but was too weak.  Orville described reflux that his father has and felt that this may be causing the movements.  I will re-order pepcid.  We talked about the amount of oxygen Calvin Mccormick is on (1L).  Lenus is concerned that it is drying his father out.  I will order humidified oxygen.  Jahmarion stated his mother wanted the pulse ox left in the room.  I explained that the doctors are not using that to assess his father any more - that we assess him by focusing on his personal presentation.  We discussed not needing to eat at end of life as Lamberto expressed concern that his father is not eating.  Finally Jaquavian and I discussed whether or not his father needed scheduled morphine.  At this point his father is resting comfortable.  Tiegan was concerned that when his father does need the PRN morphine it may take too long to get it.   Assessment: Patient actively dying.   On 1 L of oxygen, still making urine, appears comfortable.   Patient Profile/HPI:  Calvin Mccormick is a 74 y.o. male with multiple medical problems including multiple myeloma in relapse on  treatment with selinexor and dexamethasone who was admitted to the hospital on 05/27/2019 with altered mental status.  Reportedly, patient had N/V with progressive confusion and lethargy over the 2 days prior to admission.  Work-up to date is unrevealing of cause for altered mental status.  Patient was felt to have dehydration.    Length of Stay: 9   Vital Signs: BP (!) 144/83 (BP Location: Left Arm)   Pulse 79  Temp 97.9 F (36.6 C)   Resp 15   Ht '5\' 11"'  (1.803 m)   Wt 56.2 kg   SpO2 97%   BMI 17.28 kg/m  SpO2: SpO2: 97 % O2 Device: O2 Device: Nasal Cannula O2 Flow Rate: O2 Flow Rate (L/min): 1 L/min       Palliative Assessment/Data: 10%     Palliative Care Plan    Recommendations/Plan:  Hydrate oxygen  Re-order pepcid.  Patient has PRN morphine for discomfort/pain.  Family would benefit from on-going chaplain support.   Code Status:  DNR  Prognosis:   Hours - Days   Discharge Planning:  Anticipated Hospital Death  Care plan was discussed with RN, Levone - Son, and Dr. Rowe Pavy  Thank you for allowing the Palliative Medicine Team to assist in the care of this patient.  Total time spent:  25 min.     Greater than 50%  of this time was spent counseling and coordinating care related to the above assessment and plan.  Florentina Jenny, PA-C Palliative Medicine  Please contact Palliative MedicineTeam phone at [phone] for questions and concerns between 7 am - 7 pm.   Please see AMION for individual provider pager numbers.

## 2019-06-06 NOTE — Plan of Care (Signed)
  Problem: Education: Goal: Knowledge of General Education information will improve Description: Including pain rating scale, medication(s)/side effects and non-pharmacologic comfort measures 06/06/2019 1531 by Hubert Azure, RN Outcome: Progressing 06/06/2019 1531 by Hubert Azure, RN Outcome: Progressing   Problem: Clinical Measurements: Goal: Will remain free from infection Outcome: Progressing   Problem: Nutrition: Goal: Adequate nutrition will be maintained Outcome: Progressing   Problem: Coping: Goal: Level of anxiety will decrease Outcome: Progressing   Problem: Elimination: Goal: Will not experience complications related to bowel motility Outcome: Progressing Goal: Will not experience complications related to urinary retention Outcome: Progressing   Problem: Pain Managment: Goal: General experience of comfort will improve Outcome: Progressing   Problem: Safety: Goal: Ability to remain free from injury will improve Outcome: Progressing   Problem: Skin Integrity: Goal: Risk for impaired skin integrity will decrease Outcome: Progressing

## 2019-06-06 NOTE — Progress Notes (Signed)
PROGRESS NOTE    Calvin Mccormick  SWN:[id number] DOB: 01/25/1945 DOA: 05/14/2019 PCP: Lucianne Lei, MD   Brief Narrative: 74 y.o.malewith medical history significant formultiple myeloma in relapse currently on chemotherapy-dexamethasone 16 mg every Tuesday and Thursday and selinexor at 60 mg every Tuesday and Thursday followed by Dr. Alvy Bimler, CKD stage IIIa from [place] creatinine 1.2-1.6, bun IN 10s in 04/2019, slowly worsening recently 1.7-2.2, anemia/pancytopenia,chronic diarrhea,hypertension, hyperlipidemia brought to the ED with unresponsiveness. Patient unable to provide history and presentation. As per family patient developed significant nausea and vomiting after taking "stomach pills" yesterday afternoon andbecame less responsive and since then has been intermittently unresponsive sometimes walking up,not been eating well for the past 2 days. No reported fever, chest pain, nausea, vomiting, fever, chills,, focal weakness, dysuria.  In ED:On presentation patient was minimally responsive, vitals stable saturating well on room air, temperature 97.4,blood work showed hyponatremia, worsening renal failure lactic acid is 4.1, hemoglobin 10.9 g,platelet 44,000. Patient was given 1800 mL of fluid about to finish his bolus, since starting fluid patient has perked up is much more alert awake oriented knows he is in the hospital. When I saw his wife is at the bedside helping with history. Last x-ray and CT head no acute findings. Covid screening negative  Patient was admitted with empiric antibiotics and aggressive IV fluid hydration.  BUN/creatinine initial improving, lactic acid improved but again was worsening despite iv hydration. Cultures were negative and antibiotic discontinued patient was having diarrhea work-up negative with significant GI panel. Overall mental status no significant improvement. Seen by cardiology due to significant elevated troponin EKG changes and echo  showed acute severely reduced EF with this severe systolic dysfunction, EF 93-81% suspected Takotsubo cardiomyopathy. Patient hadacute hypotension and unresponsiveness on monitor 5/26" he had his discussion with the family and family elected for comfort measures. Assessment & Plan:   Active Problems:   Dehydration   Acute encephalopathy   Palliative care encounter   EKG abnormalities   Demand ischemia (HCC)   Altered mental status   Palliative care by specialist   General weakness   Acute combined systolic and diastolic heart failure (HCC)  Acute encephalopathy suspected metabolic/multifactorial in the setting of AKI/deconditioning dehydration, multiple myeloma, chemotherapyand has acute systolic dysfunction too.He had CT head and MRI brain no acute finding, EEG showed moderate to severe diffuse encephalopathy. B12,TSH ammonia level stable.I had discussed with Dr. Rory Percy from neurology.PtNo obvious infection on UA and chest x-ray blood culture negative and antibiotics were discontinued 5/24 as he was having diarrhea.  AKI on CKD stage IIIa/metabolic acidosis/questionable cardiorenal syndrome: Has baseline CKD from multiple myeloma with creatinine 1.2-1.6, bun IN 10s in 04/2019,slowly worsening recently 1.7-2.2.on admission patient with dehydration AKI uremia.BUN/creatinine was improving however was worsening again 5/26.renal ultrasound and no obstruction, seen by Dr. Cheyenne Adas not to escalate care, and felt poor candidate for CRRT.Creat 1.4>1.1>1.4>1.9>2.0  Acute Severe systolic dysfunction/elevated troponin with diffuse T wave changesand Elavated troponin/echo shows EF 20 to 25% with severe systolic dysfunction-in a pattern consistent with Takotsubo cardiomyopathy as per cardiology:no plan for ischemic evaluation and no reversible etiology identified and and agreed with transition to comfort measures. Patient not treated with heparin due to severe thrombocytopenia.   Pancytopenia/anemia and severe thrombocytopenia:yhis has been worsening,chemotherapy was dose adjusted. Platelet have been dropping was at 12k 06/02/19. hemoglobinmid 7.4 gm-seen by hem-onc.  Multiple Myeloma in relapse patient was getting dexamethasone 16 mg every Tuesday and Thursday and selinexor at 60 mg every Tuesday and Thursday followed by  Dr. Alvy Bimler. who felt that patient did not tolerate chemo and felt that patient is not a candidate for any additional chemotherapy agreed with DNR/DNI and ongoing palliative care assistance. Appreciate input.   Lactic acidosis: Lactate improved after initial IV fluids. Patient was on bicarb infusion prior ot comfort measures  Hypoglycemia:Patient was getting dextrose with IV fluids bicarb priro to comfort measures  Hypotonic hyponatremia Nausea and vomiting:no recurrence  Diarrhea: Unclear etiology could be due to antibioticss.Rectal tubein place, cont for suport.C. difficile negative.  Severe dehydration due to #1:He was aggressively hydrated. Currently unresponsive  Hypothyroidism:Euthyroid TSH 0.6.  Goals of care:DNR DNI. Palliative care is following closely discussed w/ Dr Arlan Organ planning for comfort measures discussion with family 5/27 am andhas been transitioned to comfort measuresAnd remains unresponsive.Support provided.multiple family members at the bedside  Pressure Injury 04/19/19 Vertebral column Mid Stage 2 -  Partial thickness loss of dermis presenting as a shallow open injury with a red, pink wound bed without slough. (Active)  04/19/19 0430  Location: Vertebral column  Location Orientation: Mid  Staging: Stage 2 -  Partial thickness loss of dermis presenting as a shallow open injury with a red, pink wound bed without slough.  Wound Description (Comments):   Present on Admission: Yes      Nutrition Problem: Inadequate oral intake Etiology: lethargy/confusion     Signs/Symptoms: NPO status     Interventions: Refer to RD note for recommendations  Estimated body mass index is 17.28 kg/m as calculated from the following:   Height as of this encounter: 5' 11" (1.803 m).   Weight as of this encounter: 56.2 kg.  DVT prophylaxis: none Code Status:dnr Family Communication:dw son Disposition Plan:  Status is: Inpatient  Dispo: The patient is from: home  Anticipated d/c is to: expecting hospital death  Anticipated d/c date is unknown  Patient currently is not medically stable to d/c.  Expecting hospital death  Consultants:  Palliative care, nephrology, oncology  Procedures: EEG Antimicrobials: Subjective: Unresponsive appears comfortable  Objective: Vitals:   06/03/19 1600 06/03/19 2112 06/05/19 0830 06/06/19 0629  BP: 125/60 110/66 (!) 142/68 (!) 144/83  Pulse: 71 64 82 79  Resp: 13 16 (!) 9 15  Temp:  97.6 F (36.4 C) 97.9 F (36.6 C)   TempSrc:  Oral    SpO2: 100% (!) 81% 100% 97%  Weight:      Height:        Intake/Output Summary (Last 24 hours) at 06/06/2019 1157 Last data filed at 06/06/2019 1000 Gross per 24 hour  Intake 103.8 ml  Output 750 ml  Net -646.2 ml   Filed Weights   05/31/2019 1831  Weight: 56.2 kg    Examination:  General exam: Appears calm and comfortable  Respiratory system: Coarse to auscultation. Respiratory effort normal. Cardiovascular system: S1 & S2 heard, RRR. No JVD, murmurs, rubs, gallops or clicks. No pedal edema. Gastrointestinal system: Abdomen is nondistended, soft and nontender. No organomegaly or masses felt. Normal bowel sounds heard. Central nervous system: unresponsive Extremities: Symmetric 5 x 5 power. Skin: No rashes, lesions or ulcers Psychiatry: unable to assess  Data Reviewed: I have personally reviewed following labs and imaging studies  CBC: Recent Labs  Lab 05/31/19 0601 06/01/19 0251 06/02/19 0246  WBC 6.7 7.5 5.2  HGB 7.5* 9.1* 7.4*  HCT 22.6* 27.4* 22.1*   MCV 89.7 89.5 88.8  PLT 19* 18* 12*   Basic Metabolic Panel: Recent Labs  Lab 05/31/19 0601 05/31/19 1438 06/01/19 0251 06/02/19  0246 06/02/19 0739  NA 140  --  137 133* 134*  K 2.5* 3.4* 4.2 5.8* 4.5  CL 112*  --  112* 111 110  CO2 21*  --  16* 15* 16*  GLUCOSE 133*  --  100* 84 72  BUN 17  --  20 32* 33*  CREATININE 1.17  --  1.40* 1.99* 2.01*  CALCIUM 7.5*  --  8.8* 8.8* 9.0  MG  --   --   --  1.8  --    GFR: Estimated Creatinine Clearance: 24.9 mL/min (A) (by C-G formula based on SCr of 2.01 mg/dL (H)). Liver Function Tests: Recent Labs  Lab 05/31/19 0601 06/01/19 0251 06/02/19 0246  AST 18 18 41  ALT 28 27 48*  ALKPHOS 55 63 60  BILITOT 0.7 0.8 0.8  PROT 5.1* 6.0* 5.0*  ALBUMIN 2.4* 2.9* 2.6*   No results for input(s): LIPASE, AMYLASE in the last 168 hours. No results for input(s): AMMONIA in the last 168 hours. Coagulation Profile: No results for input(s): INR, PROTIME in the last 168 hours. Cardiac Enzymes: Recent Labs  Lab 06/02/19 0739  CKTOTAL 117   BNP (last 3 results) No results for input(s): PROBNP in the last 8760 hours. HbA1C: No results for input(s): HGBA1C in the last 72 hours. CBG: Recent Labs  Lab 06/02/19 0832 06/02/19 0859 06/02/19 1246 06/02/19 1557  GLUCAP 63* 149* 81 80   Lipid Profile: No results for input(s): CHOL, HDL, LDLCALC, TRIG, CHOLHDL, LDLDIRECT in the last 72 hours. Thyroid Function Tests: No results for input(s): TSH, T4TOTAL, FREET4, T3FREE, THYROIDAB in the last 72 hours. Anemia Panel: No results for input(s): VITAMINB12, FOLATE, FERRITIN, TIBC, IRON, RETICCTPCT in the last 72 hours. Sepsis Labs: No results for input(s): PROCALCITON, LATICACIDVEN in the last 168 hours.  Recent Results (from the past 240 hour(s))  Urine culture     Status: None   Collection Time: 06/05/2019  3:27 PM   Specimen: Urine, Clean Catch  Result Value Ref Range Status   Specimen Description   Final    URINE, CLEAN CATCH Performed  at [facility], [place] [address] [place], [place] [address]    Special Requests   Final    NONE Performed at [facility], [place] [address], [place], [place] [address]    Culture   Final    NO GROWTH Performed at [facility], [place] [address] [place],  [address]    Report Status 05/30/2019 FINAL  Final  SARS Coronavirus 2 by RT PCR (hospital order, performed in [facility] hospital lab) Nasopharyngeal Nasopharyngeal Swab     Status: None   Collection Time: 05/29/2019  4:12 PM   Specimen: Nasopharyngeal Swab  Result Value Ref Range Status   SARS Coronavirus 2 NEGATIVE NEGATIVE Final    Comment: (NOTE) SARS-CoV-2 target nucleic acids are NOT DETECTED. The SARS-CoV-2 RNA is generally detectable in upper and lower respiratory specimens during the acute phase of infection. The lowest concentration of SARS-CoV-2 viral copies this assay can detect is 250 copies / mL. A negative result does not preclude SARS-CoV-2 infection and should not be used as the sole basis for treatment or other patient management decisions.  A negative result may occur with improper specimen collection / handling, submission of specimen other than nasopharyngeal swab, presence of viral mutation(s) within the areas targeted by this assay, and inadequate number of viral copies (<250 copies / mL). A negative result must be combined with  clinical observations, patient history, and epidemiological information. Fact Sheet for Patients:   StrictlyIdeas.no Fact Sheet for Healthcare Providers: BankingDealers.co.za This test is not yet approved or cleared  by the [place] FDA and has been authorized for detection and/or diagnosis of SARS-CoV-2 by FDA under an Emergency Use Authorization (EUA).  This EUA will remain in effect (meaning this test can be used) for the duration of the COVID-19 declaration under Section 564(b)(1)  of the Act, 21 U.S.C. section 360bbb-3(b)(1), unless the authorization is terminated or revoked sooner. Performed at [facility], [place] [address], [place], [place] [address]   Culture, blood (routine x 2)     Status: None   Collection Time: 05/31/2019  4:18 PM   Specimen: BLOOD  Result Value Ref Range Status   Specimen Description   Final    BLOOD SITE NOT SPECIFIED Performed at [place] [address], [place], [place] [address]    Special Requests   Final    BOTTLES DRAWN AEROBIC AND ANAEROBIC Blood Culture adequate volume Performed at [place] [address] [place], [place] [address]    Culture   Final    NO GROWTH 5 DAYS Performed at [facility], [place] [address] [place], [place] [address]    Report Status 06/02/2019 FINAL  Final  MRSA PCR Screening     Status: None   Collection Time: 05/26/2019  6:25 PM   Specimen: Nasal Mucosa; Nasopharyngeal  Result Value Ref Range Status   MRSA by PCR NEGATIVE NEGATIVE Final    Comment:        The GeneXpert MRSA Assay (FDA approved for NASAL specimens only), is one component of a comprehensive MRSA colonization surveillance program. It is not intended to diagnose MRSA infection nor to guide or monitor treatment for MRSA infections. Performed at [facility], [place] [address] [place], [place] [address]   Culture, blood (routine x 2)     Status: None   Collection Time: 06/01/2019  6:47 PM   Specimen: BLOOD  Result Value Ref Range Status   Specimen Description   Final    BLOOD RIGHT ANTECUBITAL Performed at [place] [address], [place], [place] [address]    Special Requests   Final    BOTTLES DRAWN AEROBIC AND ANAEROBIC Blood Culture adequate volume Performed at [place] [address] [place], [place] [address]    Culture   Final    NO GROWTH 5 DAYS Performed at [facility],  [place] [address], [place], [place] [address]    Report Status 06/03/2019 FINAL  Final  C Difficile Quick Screen w PCR reflex     Status: None   Collection Time: 05/30/19 10:17 PM   Specimen: STOOL  Result Value Ref Range Status   C Diff antigen NEGATIVE NEGATIVE Final   C Diff toxin NEGATIVE NEGATIVE Final   C Diff interpretation No C. difficile detected.  Final    Comment: Performed at [facility], [place] [address], [place], [place] [address]         Radiology Studies: No results found.      Scheduled Meds: . brimonidine  1 drop Right Eye TID  . Chlorhexidine Gluconate Cloth  6 each Topical Daily  . dorzolamide-timolol  1 drop Right Eye BID  . fluorometholone  1 drop Right Eye Daily  . latanoprost  1 drop Right Eye QHS  . mouth  rinse  15 mL Mouth Rinse BID  . sodium chloride flush  10-40 mL Intracatheter Q12H  . sodium chloride flush  3 mL Intravenous Q12H   Continuous Infusions: . sodium chloride Stopped (06/03/19 0025)  . methocarbamol (ROBAXIN) IV       LOS: 9 days      Georgette Shell, MD  06/06/2019, 11:57 AM

## 2019-06-06 NOTE — Progress Notes (Signed)
PHARMACY NOTE -  RENAL DOSE ADJUSTMENT  Patient has been initiated on pepcid  SCr 2.01, estimated CrCl 25 ml/min  Plan: pepcid reduced to 20 mg IV q24 for renal fxn  Eudelia Bunch, Pharm.D 06/06/2019 1:02 PM

## 2019-06-07 MED ORDER — SODIUM CHLORIDE 0.9 % IV SOLN
0.2500 mg/h | INTRAVENOUS | Status: DC
  Administered 2019-06-07: 0.25 mg/h via INTRAVENOUS
  Filled 2019-06-07: qty 5

## 2019-06-07 MED ORDER — HYDROMORPHONE BOLUS VIA INFUSION
0.5000 mg | INTRAVENOUS | Status: DC | PRN
  Filled 2019-06-07: qty 1

## 2019-06-07 NOTE — Progress Notes (Signed)
Patient mouthcare performed q2hrs and prn, patient resting comfortably at this time; family is at bedside.

## 2019-06-07 NOTE — Progress Notes (Signed)
PROGRESS NOTE    Calvin Mccormick  KZS:[id number] DOB: [birth year] DOA: 05/26/2019 PCP: Lucianne Lei, MD   Chef Complaints:unresponsiveness  Brief Narrative: 74 year old man with significant past medical history of refractory multiple myeloma in relapse on chemo with dexamethasone and selinexor, CKD stage IIIa with slowly worsening BUN/creatinine, anemia/pancytopenia, chronic diarrhea, hypertension, hyperlipidemia brought to the ED on 5/21 with 2 days history of intermittent unresponsiveness at home along with nausea and vomiting after taking "stomach pills". On presentation patient was minimally responsive blood pressure stable labs showed hyponatremia worsening BUN and creatinine, lactic acidosis, thrombocytopenia, and anemia.  Patient was hydrated aggressively with IV fluid hydration with which he appeared to have perked up on initial exam in the ED was interactive answering questions.  Patient chest Xray and CT head no acute finding.  COVID-19 negative. Patient was subsequently admitted for further management. He was initially on empiric antibiotics, culture data unremarkable, also received IV fluid hydration aggressively, BUN/creatinine initially down trended, lactic acid improved but again had worsening BUN/creatinine despite IV hydration, had significant hypokalemia.  Overall mental status did not improve significantly, patient continued to remain encephalopathic underwent MRI brain and EEG that showed no acute stroke no acute finding and moderate to severe diffuse encephalopathy, and had hyperkalemia EKG with diffuse T wave changes, and elevated troponin up to 700 felt to be demand mismatch, unable to anticoagulate due to worsening thrombocytopenia.  Patient was seen by cardiology obtain echocardiogram and was reviewed by Dr. Oval Linsey, report not available and it showed EF of 20 to 25% with acute severe systolic dysfunction, pattern consistent with Takotsubo cardiomyopathy. On 5/26 night patient had  acute episode of hypotension in 60s and also being obtunded, night MD Dr. Also unresponsiveness- Dr Marvis Repress evaluated and discussed with patient's wife/family and family decided on comfort measures transition.  Patient has been followed very closely by palliative care.  Patient was also seen by cardiology, nephrology and oncology.  Subjective:  Remains unresponsive, wife and son at the bedside. They report intermittently patient moves toes otherwise no response   Assessment & Plan:  Acute encephalopathy unclear etiology likely metabolic multifactorial in the setting of AKI/deconditioning/dehydration, multiple myeloma on chemo acute systolic dysfunction.  CT head, MRI brain no acute finding, EEG moderate to severe diffuse encephalopathy B12 TSH ammonia level normal.  Neurologist Dr. Rory Percy was consulted regarding further plan an MRI EEG was obtained as above.?chemo induced.  Remains obtunded unresponsive.  AKI on CKD stage III/metabolic acidosis/questionable cardiorenal syndrome: Patient was seen by Dr. Ria Bush from nephrology felt to be poor candidate for CRRT and advised not to escalate care.  Patient is was on bicarb drip and has been discontinued once transition to comfort measures  Demand ischemia with elevated troponin.  Unable to anticoagulate due to thrombocytopenia.  Acute severe systolic dysfunction with LVEF 20 to 73%/UKGUR combined systolic and diastolic heart failure, pattern consistent with Takotsubo cardiomyopathy, no plan for ischemic evaluation by cardiology and signed off.  Pancytopenia with anemia and severe thrombocytopenia: Hemoglobin as low 7.4 g and platelet 12,000.  Followed by heme-onc in the setting of chemo use.  Refractory multiple myeloma, in relapse was on dexamethasone and selinexor, follows with Dr. Alvy Bimler Hematology feel patient is not a candidate for any additional chemo, recommend DNR/DNI and ongoing palliative care.  Lactic acidosis Hypoglycemia Diarrhea with  history of chronic diarrhea-C. difficile and GI panel were negative Hypothyroidism  Goals of care: DNR/DNI, palliative care following.  Patient has been transitioned to comfort measure after sudden episode  of hypotension and obtundation on 5/26-after discussing with the family.  Appreciate palliative care input on board-continue current measure for comfort as per palliative care team.  DVT prophylaxis: Deferred for comfort Code Status: DNR Family Communication: plan of care discussed with patient's wife and son at bedside.  Status is: Inpatient Remains inpatient appropriate because:Unsafe d/c plan, IV treatments appropriate due to intensity of illness or inability to take PO and Ongoing comfort measures Dispo: The patient is from: Home              Anticipated d/c is to: TBD              Anticipated d/c date is: hours to days              Patient currently is not medically stable to d/c.  Diet Order            DIET - DYS 1 Room service appropriate? No; Fluid consistency: Thin  Diet effective now              Nutrition Problem: Inadequate oral intake Etiology: lethargy/confusion Signs/Symptoms: NPO status Interventions: Refer to RD note for recommendations Body mass index is 17.28 kg/m.  Consultants:see note  Procedures:see note Microbiology:see note  Medications: Scheduled Meds: . brimonidine  1 drop Right Eye TID  . Chlorhexidine Gluconate Cloth  6 each Topical Daily  . dorzolamide-timolol  1 drop Right Eye BID  . fluorometholone  1 drop Right Eye Daily  . latanoprost  1 drop Right Eye QHS  . mouth rinse  15 mL Mouth Rinse BID  . sodium chloride flush  3 mL Intravenous Q12H   Continuous Infusions: . sodium chloride Stopped (06/03/19 0025)  . famotidine (PEPCID) IV 20 mg (06/07/19 1013)  . HYDROmorphone 0.25 mg/hr (06/07/19 1044)  . methocarbamol (ROBAXIN) IV      Antimicrobials: Anti-infectives (From admission, onward)   Start     Dose/Rate Route Frequency  Ordered Stop   05/29/19 1600  ceFEPIme (MAXIPIME) 2 g in sodium chloride 0.9 % 100 mL IVPB  Status:  Discontinued     2 g 200 mL/hr over 30 Minutes Intravenous Every 24 hours 06/05/2019 1916 05/31/19 0754   06/04/2019 1630  ceFEPIme (MAXIPIME) 2 g in sodium chloride 0.9 % 100 mL IVPB     2 g 200 mL/hr over 30 Minutes Intravenous  Once 05/15/2019 1619 05/11/2019 1803       Objective: Vitals: Today's Vitals   06/06/19 1200 06/06/19 2000 06/06/19 2318 06/07/19 0708  BP:      Pulse:      Resp:      Temp:      TempSrc:      SpO2:      Weight:      Height:      PainSc: 0-No pain 0-No pain 0-No pain Asleep    Intake/Output Summary (Last 24 hours) at 06/07/2019 1508 Last data filed at 06/07/2019 1013 Gross per 24 hour  Intake 130 ml  Output 300 ml  Net -170 ml   Filed Weights   05/12/2019 1831  Weight: 56.2 kg   Weight change:    Intake/Output from previous day: 05/30 0701 - 05/31 0700 In: 150 [I.V.:100; IV Piggyback:50] Out: 1600 [Urine:1600] Intake/Output this shift: Total I/O In: 40 [I.V.:40] Out: -   Examination:  General exam: unresponsive, mouth open, weak, HEENT:Oral mucosa dry Ear/Nose WNL grossly,dentition normal. Respiratory system: bilaterally diminished,no use of accessory muscle, non tender. Cardiovascular system: S1 & S2 +.  Gastrointestinal system: Abdomen soft,ND, BS+. Nervous System: Limited exam, unresponsive Extremities: No edema, distal peripheral pulses palpable.  Skin: No rashes,no icterus. MSK: thin muscle bulk,tone, power  Data Reviewed: I have personally reviewed following labs and imaging studies CBC: Recent Labs  Lab 06/01/19 0251 06/02/19 0246  WBC 7.5 5.2  HGB 9.1* 7.4*  HCT 27.4* 22.1*  MCV 89.5 88.8  PLT 18* 12*   Basic Metabolic Panel: Recent Labs  Lab 06/01/19 0251 06/02/19 0246 06/02/19 0739  NA 137 133* 134*  K 4.2 5.8* 4.5  CL 112* 111 110  CO2 16* 15* 16*  GLUCOSE 100* 84 72  BUN 20 32* 33*  CREATININE 1.40* 1.99*  2.01*  CALCIUM 8.8* 8.8* 9.0  MG  --  1.8  --    GFR: Estimated Creatinine Clearance: 24.9 mL/min (A) (by C-G formula based on SCr of 2.01 mg/dL (H)). Liver Function Tests: Recent Labs  Lab 06/01/19 0251 06/02/19 0246  AST 18 41  ALT 27 48*  ALKPHOS 63 60  BILITOT 0.8 0.8  PROT 6.0* 5.0*  ALBUMIN 2.9* 2.6*   No results for input(s): LIPASE, AMYLASE in the last 168 hours. No results for input(s): AMMONIA in the last 168 hours. Coagulation Profile: No results for input(s): INR, PROTIME in the last 168 hours. Cardiac Enzymes: Recent Labs  Lab 06/02/19 0739  CKTOTAL 117   BNP (last 3 results) No results for input(s): PROBNP in the last 8760 hours. HbA1C: No results for input(s): HGBA1C in the last 72 hours. CBG: Recent Labs  Lab 06/02/19 0832 06/02/19 0859 06/02/19 1246 06/02/19 1557  GLUCAP 63* 149* 81 80   Lipid Profile: No results for input(s): CHOL, HDL, LDLCALC, TRIG, CHOLHDL, LDLDIRECT in the last 72 hours. Thyroid Function Tests: No results for input(s): TSH, T4TOTAL, FREET4, T3FREE, THYROIDAB in the last 72 hours. Anemia Panel: No results for input(s): VITAMINB12, FOLATE, FERRITIN, TIBC, IRON, RETICCTPCT in the last 72 hours. Sepsis Labs: No results for input(s): PROCALCITON, LATICACIDVEN in the last 168 hours.  Recent Results (from the past 240 hour(s))  Urine culture     Status: None   Collection Time: 05/16/2019  3:27 PM   Specimen: Urine, Clean Catch  Result Value Ref Range Status   Specimen Description   Final    URINE, CLEAN CATCH Performed at [facility], [place] [address] [place], [place] [address]    Special Requests   Final    NONE Performed at [facility], [place] [address] [address] [place], [place] [address]    Culture   Final    NO GROWTH Performed at [facility], [place] [address] [place],  [address]    Report Status 05/30/2019 FINAL  Final  SARS Coronavirus 2 by RT PCR (hospital order,  performed in [facility] hospital lab) Nasopharyngeal Nasopharyngeal Swab     Status: None   Collection Time: 05/31/2019  4:12 PM   Specimen: Nasopharyngeal Swab  Result Value Ref Range Status   SARS Coronavirus 2 NEGATIVE NEGATIVE Final    Comment: (NOTE) SARS-CoV-2 target nucleic acids are NOT DETECTED. The SARS-CoV-2 RNA is generally detectable in upper and lower respiratory specimens during the acute phase of infection. The lowest concentration of SARS-CoV-2 viral copies this assay can detect is 250 copies / mL. A negative result does not preclude SARS-CoV-2 infection and should not be used as the sole basis for treatment or other patient management decisions.  A negative result may occur with improper specimen collection /  handling, submission of specimen other than nasopharyngeal swab, presence of viral mutation(s) within the areas targeted by this assay, and inadequate number of viral copies (<250 copies / mL). A negative result must be combined with clinical observations, patient history, and epidemiological information. Fact Sheet for Patients:   StrictlyIdeas.no Fact Sheet for Healthcare Providers: BankingDealers.co.za This test is not yet approved or cleared  by the [place] FDA and has been authorized for detection and/or diagnosis of SARS-CoV-2 by FDA under an Emergency Use Authorization (EUA).  This EUA will remain in effect (meaning this test can be used) for the duration of the COVID-19 declaration under Section 564(b)(1) of the Act, 21 U.S.C. section 360bbb-3(b)(1), unless the authorization is terminated or revoked sooner. Performed at [facility], [place] [address], [place], [place] [address]   Culture, blood (routine x 2)     Status: None   Collection Time: 05/18/2019  4:18 PM   Specimen: BLOOD  Result Value Ref Range Status   Specimen Description   Final    BLOOD SITE NOT SPECIFIED Performed at  [place] [address], [place], [place] [address]    Special Requests   Final    BOTTLES DRAWN AEROBIC AND ANAEROBIC Blood Culture adequate volume Performed at [place] [address] [place], [place] [address]    Culture   Final    NO GROWTH 5 DAYS Performed at [facility], [place] [address], [place], [place] [address]    Report Status 06/02/2019 FINAL  Final  MRSA PCR Screening     Status: None   Collection Time: 05/09/2019  6:25 PM   Specimen: Nasal Mucosa; Nasopharyngeal  Result Value Ref Range Status   MRSA by PCR NEGATIVE NEGATIVE Final    Comment:        The GeneXpert MRSA Assay (FDA approved for NASAL specimens only), is one component of a comprehensive MRSA colonization surveillance program. It is not intended to diagnose MRSA infection nor to guide or monitor treatment for MRSA infections. Performed at [facility], [place] [address] [place], [place] [address]   Culture, blood (routine x 2)     Status: None   Collection Time: 05/23/2019  6:47 PM   Specimen: BLOOD  Result Value Ref Range Status   Specimen Description   Final    BLOOD RIGHT ANTECUBITAL Performed at [place] [address] [place], [place] [address]    Special Requests   Final    BOTTLES DRAWN AEROBIC AND ANAEROBIC Blood Culture adequate volume Performed at [place] [address], [place], [place] [address]    Culture   Final    NO GROWTH 5 DAYS Performed at [facility], [place] [address], [place], [place] [address]    Report Status 06/03/2019 FINAL  Final  C Difficile Quick Screen w PCR reflex     Status: None   Collection Time: 05/30/19 10:17 PM   Specimen: STOOL  Result Value Ref Range Status   C Diff antigen NEGATIVE NEGATIVE Final   C Diff toxin NEGATIVE NEGATIVE Final   C Diff interpretation No C. difficile detected.  Final    Comment: Performed at [facility], [place] [address], [place], [place] [address]      Radiology Studies: No results found.   LOS: 10 days   Antonieta Pert, MD [facility]  06/07/2019, 3:08 PM

## 2019-06-08 ENCOUNTER — Inpatient Hospital Stay: Payer: Medicare Other

## 2019-06-08 ENCOUNTER — Inpatient Hospital Stay: Payer: Medicare Other | Admitting: Hematology and Oncology

## 2019-06-08 ENCOUNTER — Encounter (HOSPITAL_COMMUNITY): Payer: Medicare Other | Admitting: Dentistry

## 2019-06-08 DEATH — deceased

## 2019-07-08 NOTE — Progress Notes (Addendum)
Patient found with  no pulse and spontaneous breathing, he was on comfort care. He passed away at 04/08/15, pronounced dead by two RNs, [place] and Geoffry [place]. On call NP Sharlet Salina was notified. Family at the bedside.

## 2019-07-08 NOTE — Progress Notes (Signed)
PROGRESS NOTE    Calvin Mccormick  IRS:[id number] DOB: 22-Dec-1945 DOA: 06/03/2019 PCP: Lucianne Lei, MD   Chef Complaints:unresponsiveness  Brief Narrative: 74 year old man with significant past medical history of refractory multiple myeloma in relapse on chemo with dexamethasone and selinexor, CKD stage IIIa with slowly worsening BUN/creatinine, anemia/pancytopenia, chronic diarrhea, hypertension, hyperlipidemia brought to the ED on 5/21 with 2 days history of intermittent unresponsiveness at home along with nausea and vomiting after taking "stomach pills". On presentation patient was minimally responsive blood pressure stable labs showed hyponatremia worsening BUN and creatinine, lactic acidosis, thrombocytopenia, and anemia.  Patient was hydrated aggressively with IV fluid hydration with which he appeared to have perked up on initial exam in the ED was interactive answering questions.  Patient chest Xray and CT head no acute finding.  COVID-19 negative. Patient was subsequently admitted for further management. He was initially on empiric antibiotics, culture data unremarkable, also received IV fluid hydration aggressively, BUN/creatinine initially down trended, lactic acid improved but again had worsening BUN/creatinine despite IV hydration, had significant hypokalemia.  Overall mental status did not improve significantly, patient continued to remain encephalopathic underwent MRI brain and EEG that showed no acute stroke no acute finding and moderate to severe diffuse encephalopathy, and had hyperkalemia EKG with diffuse T wave changes, and elevated troponin up to 700 felt to be demand mismatch, unable to anticoagulate due to worsening thrombocytopenia.  Patient was seen by cardiology obtain echocardiogram and was reviewed by Dr. Oval Linsey, report not available and it showed EF of 20 to 25% with acute severe systolic dysfunction, pattern consistent with Takotsubo cardiomyopathy. On 5/26 night patient had  acute episode of hypotension in 60s and also being obtunded, night MD Dr. Also unresponsiveness- Dr Marvis Repress evaluated and discussed with patient's wife/family and family decided on comfort measures transition.  Patient has been followed very closely by palliative care.  Patient was also seen by cardiology, nephrology and oncology.  Subjective: Remains obtunded. No family at the bedside Met with the son earlier and spoke in hallway. RN reports no acute events.  Assessment & Plan:  Acute encephalopathy: unclear etiology likely multifactorial in the setting of AKI/deconditioning/dehydration, multiple myeloma on chemo acute systolic dysfunction,?chemo induced. CT head, MRI brain no acute finding, EEG moderate to severe diffuse encephalopathy B12 TSH ammonia level normal.  Neurologist Dr. Rory Percy was consulted regarding further plan an MRI EEG was obtained as above.  Remains unresponsive. on comfort measures.  AKI on CKD stage III/metabolic acidosis/questionable cardiorenal syndrome: Patient was seen by nephrologist Dr. Justin Mend felt to be poor candidate for CRRT and advised not to escalate care. Was on bicarb drip prior to comfort measures  Elevated troponin/demand ischemia-seen by cardiology,unable to anticoagulate due to thrombocytopenia.  Echo showed severely decreased EF  Acute severe systolic dysfunction with LVEF 20 to 00%/XFGHW combined systolic and diastolic heart failure, pattern consistent with Takotsubo cardiomyopathy as per cardiology. Cardiology and signed off.  Pancytopenia with anemia and severe thrombocytopenia: Hemoglobin as low 7.4 g and platelet 12,000.  Followed by heme-onc in the setting of chemo use.  Refractory multiple myeloma, in relapse was on dexamethasone and selinexor, follows with Dr. Alvy Bimler Hematology felT patient is not a candidate for any additional chemo, recommended DNR/DNI and ongoing palliative care.  Lactic acidosis Hypoglycemia Diarrhea with history of chronic  diarrhea-C. difficile and GI panel were negative. Rectal tube in place for comfort. Hypothyroidism  Goals of care: DNR/DNI, palliative care following.  Patient has been transitioned to comfort measure after sudden episode  of hypotension and obtundation on 5/26 nigh-after discussing with the family ny Night MD Dr Marvis Repress.  Continue current plan of care as back palliative care to focus on comfort per family wishes. They do not prefer the patient to go to inpatient hospice based on patient's wishes.  DVT prophylaxis: Deferred for comfort Code Status: DNR Family Communication: Plan of care was discussed multiple times with patient's wife and son.   Status is: Inpatient Remains inpatient appropriate because:Unsafe d/c plan, IV treatments appropriate due to intensity of illness or inability to take PO and Ongoing comfort measures Dispo: The patient is from: Home              Anticipated d/c is to: TBD.               Anticipated d/c date is: hours to days              Patient currently is not medically stable to d/c.  Diet Order            DIET - DYS 1 Room service appropriate? No; Fluid consistency: Thin  Diet effective now              Nutrition Problem: Inadequate oral intake Etiology: lethargy/confusion Signs/Symptoms: NPO status Interventions: Refer to RD note for recommendations Body mass index is 17.28 kg/m.  Consultants:see note  Procedures:see note Microbiology:see note  Medications: Scheduled Meds: . brimonidine  1 drop Right Eye TID  . Chlorhexidine Gluconate Cloth  6 each Topical Daily  . dorzolamide-timolol  1 drop Right Eye BID  . fluorometholone  1 drop Right Eye Daily  . latanoprost  1 drop Right Eye QHS  . mouth rinse  15 mL Mouth Rinse BID  . sodium chloride flush  3 mL Intravenous Q12H   Continuous Infusions: . sodium chloride Stopped (06/03/19 0025)  . famotidine (PEPCID) IV 20 mg (06-17-19 1019)  . HYDROmorphone 0.25 mg/hr (06/07/19 1044)  .  methocarbamol (ROBAXIN) IV      Antimicrobials: Anti-infectives (From admission, onward)   Start     Dose/Rate Route Frequency Ordered Stop   05/29/19 1600  ceFEPIme (MAXIPIME) 2 g in sodium chloride 0.9 % 100 mL IVPB  Status:  Discontinued     2 g 200 mL/hr over 30 Minutes Intravenous Every 24 hours 05/26/2019 1916 05/31/19 0754   05/29/2019 1630  ceFEPIme (MAXIPIME) 2 g in sodium chloride 0.9 % 100 mL IVPB     2 g 200 mL/hr over 30 Minutes Intravenous  Once 06/02/2019 1619 06/06/2019 1803       Objective: Vitals: Today's Vitals   06/07/19 1800 06/07/19 1941 06/07/19 2100 06-17-2019 0900  BP:      Pulse:   100   Resp:      Temp:      TempSrc:      SpO2:   99%   Weight:      Height:      PainSc: Asleep Asleep  Asleep    Intake/Output Summary (Last 24 hours) at 06/17/2019 1346 Last data filed at 17-Jun-2019 1000 Gross per 24 hour  Intake 40 ml  Output 600 ml  Net -560 ml   Filed Weights   05/27/2019 1831  Weight: 56.2 kg   Weight change:    Intake/Output from previous day: 05/31 0701 - 06/01 0700 In: 40 [I.V.:40] Out: 400 [Urine:400] Intake/Output this shift: Total I/O In: 40 [I.V.:40] Out: 200 [Urine:200]  Examination:  General exam: Unresponsive, pupils sluggish  HEENT: Oral mucosa  dry  Respiratory system: Having spontaneous breath, shallow breath Cardiovascular system: S1 & S2 +. Gastrointestinal system: Abdomen soft non-distended, BS sluggish Nervous System: Limited exam, unresponsive Extremities: Extremities are warm  Skin: No rashes,no icterus. MSK: thin muscle bulk,tone, power  Data Reviewed: I have personally reviewed following labs and imaging studies CBC: Recent Labs  Lab 06/02/19 0246  WBC 5.2  HGB 7.4*  HCT 22.1*  MCV 88.8  PLT 12*   Basic Metabolic Panel: Recent Labs  Lab 06/02/19 0246 06/02/19 0739  NA 133* 134*  K 5.8* 4.5  CL 111 110  CO2 15* 16*  GLUCOSE 84 72  BUN 32* 33*  CREATININE 1.99* 2.01*  CALCIUM 8.8* 9.0  MG 1.8  --     GFR: Estimated Creatinine Clearance: 24.9 mL/min (A) (by C-G formula based on SCr of 2.01 mg/dL (H)). Liver Function Tests: Recent Labs  Lab 06/02/19 0246  AST 41  ALT 48*  ALKPHOS 60  BILITOT 0.8  PROT 5.0*  ALBUMIN 2.6*   No results for input(s): LIPASE, AMYLASE in the last 168 hours. No results for input(s): AMMONIA in the last 168 hours. Coagulation Profile: No results for input(s): INR, PROTIME in the last 168 hours. Cardiac Enzymes: Recent Labs  Lab 06/02/19 0739  CKTOTAL 117   BNP (last 3 results) No results for input(s): PROBNP in the last 8760 hours. HbA1C: No results for input(s): HGBA1C in the last 72 hours. CBG: Recent Labs  Lab 06/02/19 0832 06/02/19 0859 06/02/19 1246 06/02/19 1557  GLUCAP 63* 149* 81 80   Lipid Profile: No results for input(s): CHOL, HDL, LDLCALC, TRIG, CHOLHDL, LDLDIRECT in the last 72 hours. Thyroid Function Tests: No results for input(s): TSH, T4TOTAL, FREET4, T3FREE, THYROIDAB in the last 72 hours. Anemia Panel: No results for input(s): VITAMINB12, FOLATE, FERRITIN, TIBC, IRON, RETICCTPCT in the last 72 hours. Sepsis Labs: No results for input(s): PROCALCITON, LATICACIDVEN in the last 168 hours.  Recent Results (from the past 240 hour(s))  C Difficile Quick Screen w PCR reflex     Status: None   Collection Time: 05/30/19 10:17 PM   Specimen: STOOL  Result Value Ref Range Status   C Diff antigen NEGATIVE NEGATIVE Final   C Diff toxin NEGATIVE NEGATIVE Final   C Diff interpretation No C. difficile detected.  Final    Comment: Performed at [facility], [place] [address] [place], [place] [address]      Radiology Studies: No results found.   LOS: 11 days   Antonieta Pert, MD [facility]  2019-06-17, 1:46 PM

## 2019-07-08 NOTE — Progress Notes (Signed)
Body  transferred to [facility].

## 2019-07-08 NOTE — Death Summary Note (Signed)
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 Barkley Bruns felT patient is not a candidate for any additional chemo,recommended DNR/DNI and ongoing palliative care.   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  Cardiorenal syndrome Hypokalemia  Hyperkalemia  Hyponatremia Hypomagnesemia Lactic acidosis Severe dehydration Diarrhea negative CT Diff and GI panel Pancytopenia Severe thrombocytopenia Palliative care encounter EKG abnormalities witn ST and T wave changes Elevated troponin/ Demand ischemia  Palliative care by specialist General weakness Hypothyroidism Chronic microvascular ischemic changes on the MRI  Brief Hospital Course (including significant findings, care, treatment, and services provided and events leading to death)  Calvin Mccormick is a 74 y.o. year old male who  74 year old man with significant past medical history of refractory multiple myeloma in relapse on chemo with dexamethasone and selinexor, CKD stage IIIa with slowly worsening BUN/creatinine, anemia/pancytopenia, chronic diarrhea, hypertension, hyperlipidemia brought to the ED on 06/22/22 with 2 days history of intermittent unresponsiveness at home along with nausea and vomiting after taking "stomach pills". On presentation patient was minimally responsive blood pressure stable labs showed hyponatremia worsening BUN and creatinine, lactic acidosis, thrombocytopenia,  and anemia.  Patient was hydrated aggressively with IV fluid hydration with which he appeared to have perked up on initial exam in the ED was interactive answering questions.  Patient chest Xray and CT head no acute finding.  COVID-19 negative. Patient was subsequently admitted for further management. He was initially on empiric antibiotics, culture data unremarkable, also received IV fluid hydration aggressively, BUN/creatinine initially down trended, lactic acid improved but again had worsening BUN/creatinine despite IV hydration, had significant hypokalemia.  Overall mental status did not improve significantly, patient continued to remain encephalopathic underwent MRI brain and EEG that showed no acute stroke no acute finding and moderate to severe diffuse encephalopathy, and had hyperkalemia EKG with diffuse T wave changes, and elevated troponin up to 700 felt to be demand mismatch, unable to anticoagulate due to worsening thrombocytopenia.  Patient was seen by cardiology obtain echocardiogram and was reviewed by Dr. Oval Linsey, report not available and it showed EF of 20 to 25% with acute severe systolic dysfunction, pattern consistent with Takotsubo cardiomyopathy.  Patient was followed closely by palliative care due to his complex medical issues and declining situation.On 5/26 night patient had acute episode of hypotension in 60s and also became obtunded, night MD Dr. Marvis Repress evaluated and discussed with patient's wife/family and family decided on comfort measures.Patient was also seen by cardiology, nephrology and oncology.  Family did not want to proceed with inpatient hospice as patient did not prefer that.  Was followed and managed by palliative care for comfort measures. Patient subsequently  Passed away with family at bedside on 07/03/19.  Refer to progress note consult notes HPI for more detail  Pertinent Labs and Studies  Significant Diagnostic Studies CT Head Wo Contrast  Result Date:  06-22-19 CLINICAL DATA:  Unresponsive, failure to thrive, multiple myeloma EXAM: CT HEAD WITHOUT CONTRAST TECHNIQUE: Contiguous axial images were obtained from the base of the  skull through the vertex without intravenous contrast. COMPARISON:  None. FINDINGS: Brain: There is scattered hypodensities throughout the periventricular white matter consistent with age-indeterminate small vessel ischemic changes. No other signs of acute infarct or hemorrhage. Lateral ventricles and midline structures are unremarkable. No acute extra-axial fluid collections. No mass effect. Vascular: No hyperdense vessel or unexpected calcification. Skull: Normal. Negative for fracture or focal lesion. Sinuses/Orbits: No acute finding. Other: None. IMPRESSION: 1. Age-indeterminate small vessel ischemic changes throughout the periventricular white matter. No acute hemorrhage. Electronically Signed   By: Randa Ngo M.D.   On: 05/14/2019 17:04   MR BRAIN WO CONTRAST  Result Date: 05/31/2019 CLINICAL DATA:  Encephalopathy EXAM: MRI HEAD WITHOUT CONTRAST TECHNIQUE: Multiplanar, multiecho pulse sequences of the brain and surrounding structures were obtained without intravenous contrast. COMPARISON:  None. FINDINGS: Brain: There is no acute infarction or intracranial hemorrhage. There is no intracranial mass, mass effect, or edema. There is no hydrocephalus or extra-axial fluid collection. Prominence of the ventricles and sulci reflects generalized parenchymal volume loss. Patchy T2 hyperintensity in the supratentorial and pontine white matter is nonspecific but may reflect mild chronic microvascular ischemic changes. Vascular: Major vessel flow voids at the skull base are preserved. Skull and upper cervical spine: Normal marrow signal is preserved. Sinuses/Orbits: Paranasal sinuses are aerated. No significant orbital finding. Other: Sella is unremarkable. Mild patchy right mastoid opacification. IMPRESSION: No evidence of recent  infarction, hemorrhage, or mass. Chronic microvascular ischemic changes. Electronically Signed   By: Macy Mis M.D.   On: 05/31/2019 11:03   US RENAL  Result Date: 06/02/2019 CLINICAL DATA:  Acute kidney injury. EXAM: RENAL / URINARY TRACT ULTRASOUND COMPLETE COMPARISON:  CT abdomen pelvis 12/06/2016 FINDINGS: Right Kidney: Renal measurements: 8.6 x 4.3 x 3.7 cm = volume: 71 mL. Increased echogenicity of the renal cortex. No mass or hydronephrosis. Left Kidney: Renal measurements: 10.2 x 5.1 x 4.3 cm = volume: 116 mL. Increased cortical echogenicity. No obstruction. 4 cm left lower pole renal cyst. Bladder: Appears normal for degree of bladder distention. Bladder volume 91 cc Other: None. IMPRESSION: Negative for renal obstruction. Relatively small kidneys with increased cortical echogenicity suggesting chronic kidney disease. Electronically Signed   By: Franchot Gallo M.D.   On: 06/02/2019 08:04   DG Chest Port 1 View  Result Date: 05/15/2019 CLINICAL DATA:  Altered mental status. EXAM: PORTABLE CHEST 1 VIEW COMPARISON:  April 18, 2019 FINDINGS: There is stable right-sided venous Port-A-Cath positioning. Mildly decreased lung volumes are seen which is likely secondary to suboptimal patient inspiration. There is no evidence of acute infiltrate, pleural effusion or pneumothorax. The heart size and mediastinal contours are within normal limits. The visualized skeletal structures are unremarkable. IMPRESSION: No active disease. Electronically Signed   By: Virgina Norfolk M.D.   On: 05/27/2019 16:41   EEG adult  Result Date: 05/31/2019 Lora Havens, MD     05/31/2019 11:11 AM Patient Name: TASHAWN LASWELL MRN: [id number] Epilepsy Attending: Lora Havens Referring Physician/Provider: Dr. Antonieta Pert Date: 05/31/2019 Duration: 22.33 minutes Patient history: 74 year old male with altered mental status.  EEG tolerated for seizures. Level of alertness: Lethargic, sleep AEDs during EEG study: None  Technical aspects: This EEG study was done with scalp electrodes positioned according to the 10-20 International system of electrode placement. Electrical activity was acquired at a sampling rate of '500Hz'  and reviewed with a high frequency filter of '70Hz'  and a low frequency filter of '1Hz' . EEG data were recorded continuously and digitally stored. Description:  No clear posterior dominant rhythm was seen.  Sleep was characterized by vertex waves, sleep spindles (12 to 14 Hz), maximal frontocentral region.  EEG showed continuous generalized 3 to 6 Hz theta-delta slowing. Hyperventilation and photic stimulation were not performed.   ABNORMALITY -Continuous slow, generalized IMPRESSION: This study is suggestive of moderate to severe diffuse encephalopathy, nonspecific etiology. No seizures or epileptiform discharges were seen throughout the recording. Lora Havens    Microbiology Recent Results (from the past 240 hour(s))  C Difficile Quick Screen w PCR reflex     Status: None   Collection Time: 05/30/19 10:17 PM   Specimen: STOOL  Result Value Ref Range Status   C Diff antigen NEGATIVE NEGATIVE Final   C Diff toxin NEGATIVE NEGATIVE Final   C Diff interpretation No C. difficile detected.  Final    Comment: Performed at [facility], [address] [place], [place] [address]    Lab Basic Metabolic Panel: No results for input(s): NA, K, CL, CO2, GLUCOSE, BUN, CREATININE, CALCIUM, MG, PHOS in the last 168 hours. Liver Function Tests: No results for input(s): AST, ALT, ALKPHOS, BILITOT, PROT, ALBUMIN in the last 168 hours. No results for input(s): LIPASE, AMYLASE in the last 168 hours. No results for input(s): AMMONIA in the last 168 hours. CBC: No results for input(s): WBC, NEUTROABS, HGB, HCT, MCV, PLT in the last 168 hours. Cardiac Enzymes: No results for input(s): CKTOTAL, CKMB, CKMBINDEX, TROPONINI in the last 168 hours. Sepsis Labs: No results for input(s):  PROCALCITON, WBC, LATICACIDVEN in the last 168 hours.  Procedures/Operations  See note   Antonieta Pert 06/09/2019, 3:06 PM

## 2019-07-08 NOTE — Care Management Important Message (Signed)
Important Message  Patient Details IM Letter given to Kathrin Greathouse SW Case Manager to present to the Patient Name: Calvin Mccormick MRN: [id number] Date of Birth: 1945-12-25   Medicare Important Message Given:  Yes     Kerin Salen 06/18/2019, 12:12 PM

## 2019-07-08 DEATH — deceased

## 2020-09-25 IMAGING — DX PORTABLE CHEST - 1 VIEW
1 series · 1 of 1 positions shown · non-contrast
Comparison: 09/30/2017

CLINICAL DATA: Nausea and vomiting.

EXAM:
PORTABLE CHEST 1 VIEW

[chest ap]
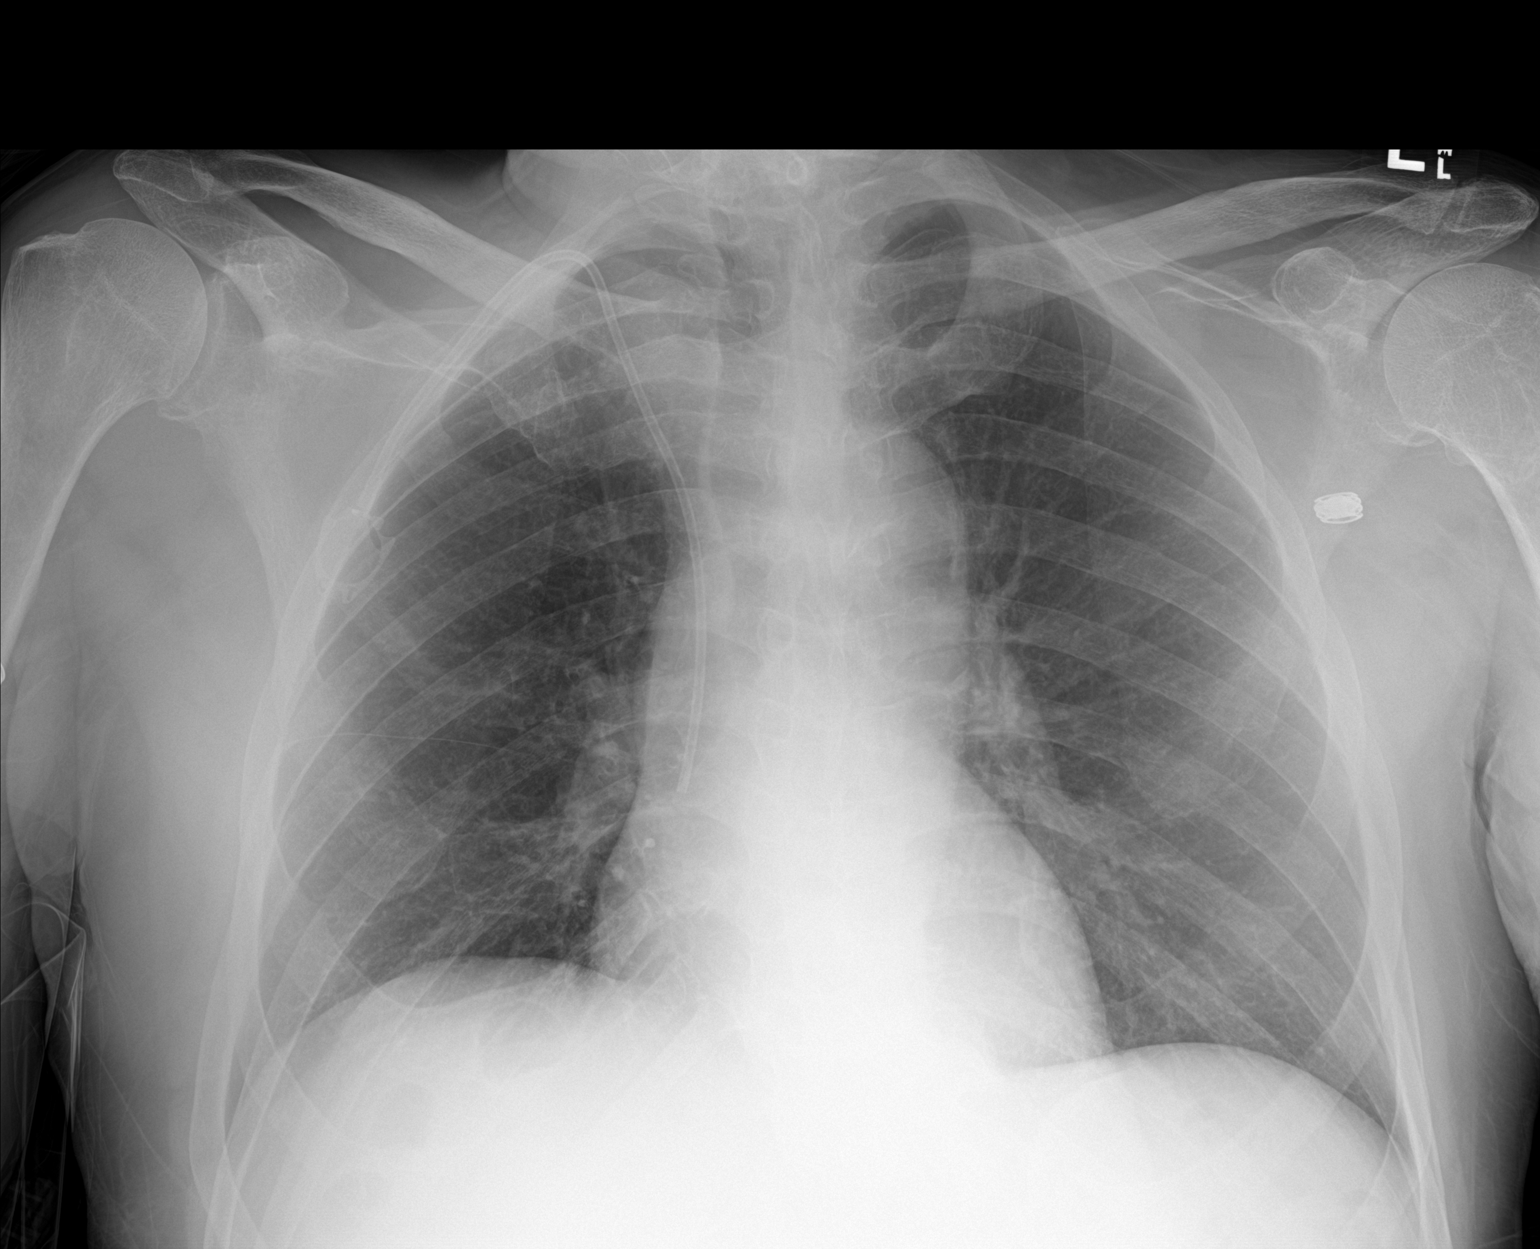

[1 of 1 positions shown; findings below may reference images not displayed]

FINDINGS: 0053 hours. The cardiopericardial silhouette is within normal limits
for size. The lungs are clear without focal pneumonia, edema,
pneumothorax or pleural effusion. Right Port-A-Cath tip overlies
distal SVC. The visualized bony structures of the thorax are intact.
IMPRESSION: No acute findings.

## 2021-10-25 IMAGING — DX DG CHEST 1V PORT
1 series · 1 of 1 positions shown · non-contrast
Comparison: 03/19/2018

CLINICAL DATA: Weakness, vomiting

EXAM:
PORTABLE CHEST 1 VIEW

[chest ap]
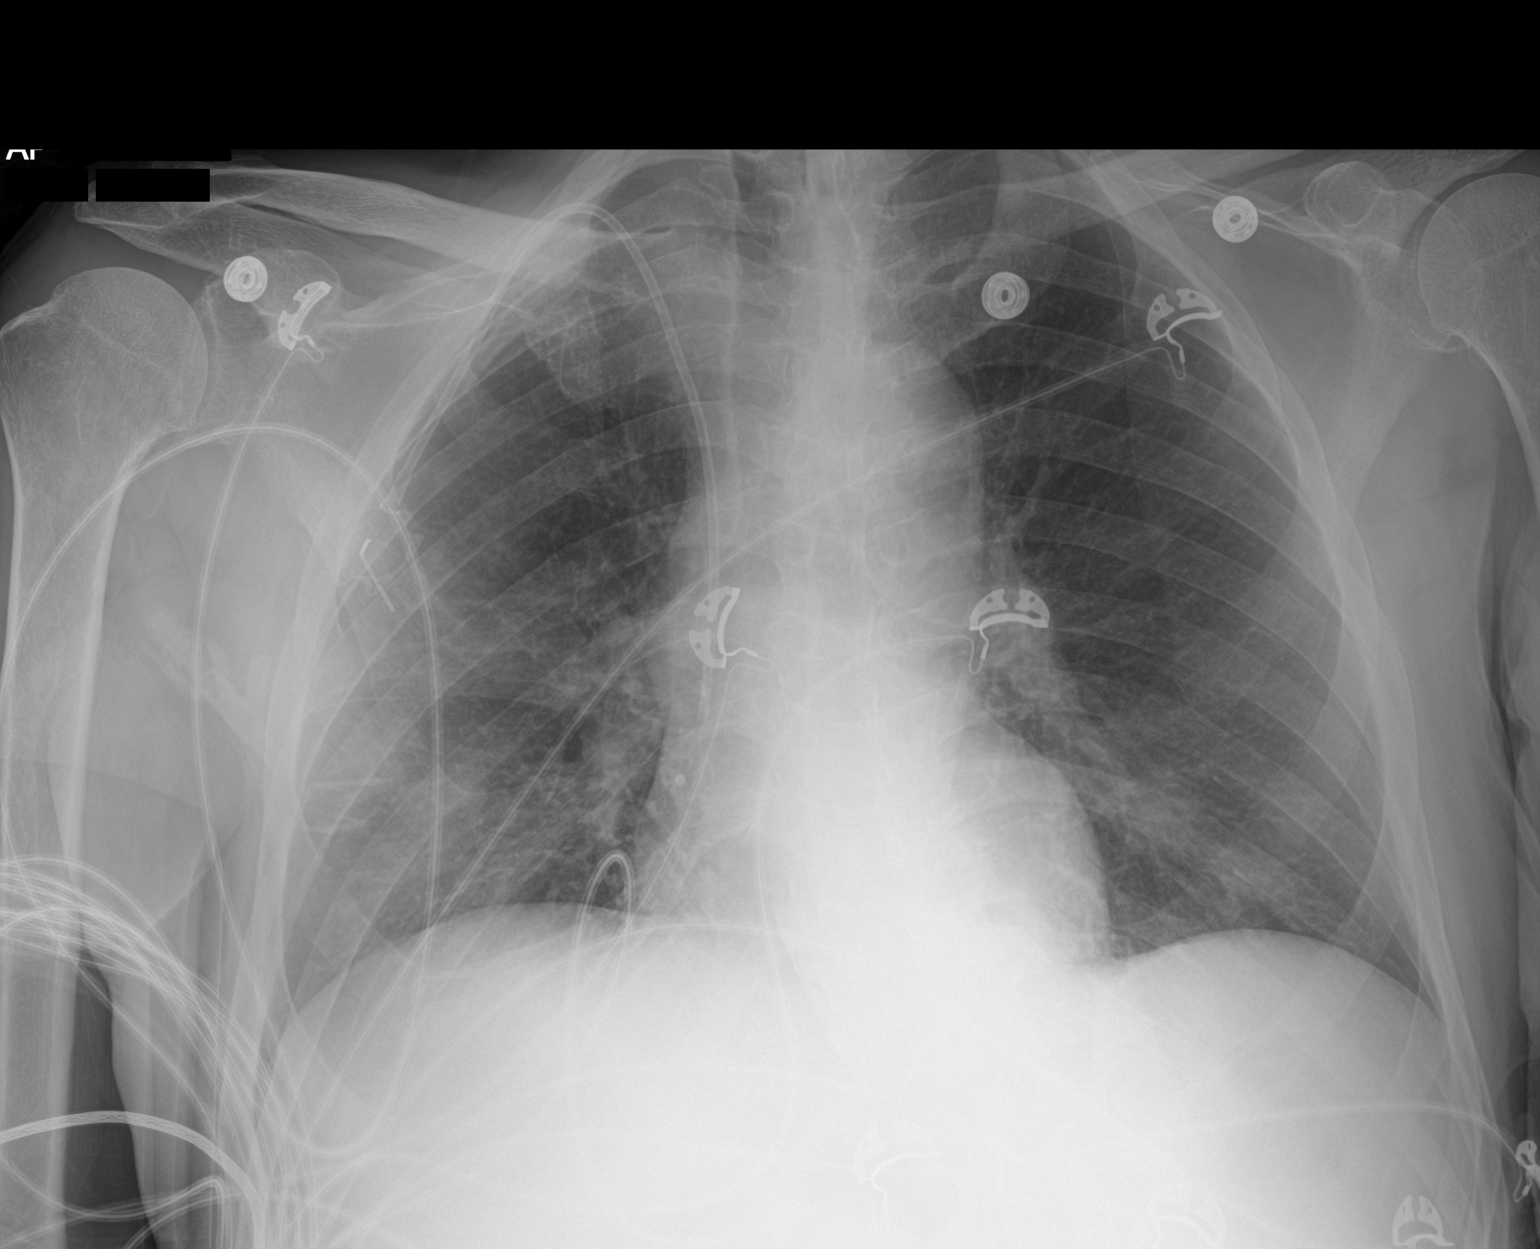

[1 of 1 positions shown; findings below may reference images not displayed]

FINDINGS: Mild patchy opacity in the right mid lung and bilateral lower lobes,
suspicious for multifocal pneumonia. No pleural effusion or
pneumothorax.

The heart is normal in size.

Right chest power port terminates at the cavoatrial junction.
IMPRESSION: Multifocal patchy opacities, suspicious for pneumonia.
